# Patient Record
Sex: Male | Born: 1940
Health system: Southern US, Community
[De-identification: ages and names within clinical notes are randomized; demographics above are authoritative.]

## PROBLEM LIST (undated history)

## (undated) DIAGNOSIS — M545 Low back pain, unspecified: Secondary | ICD-10-CM

## (undated) DIAGNOSIS — R809 Proteinuria, unspecified: Secondary | ICD-10-CM

## (undated) DIAGNOSIS — K649 Unspecified hemorrhoids: Secondary | ICD-10-CM

## (undated) DIAGNOSIS — K922 Gastrointestinal hemorrhage, unspecified: Secondary | ICD-10-CM

## (undated) DIAGNOSIS — I10 Essential (primary) hypertension: Secondary | ICD-10-CM

## (undated) DIAGNOSIS — M1990 Unspecified osteoarthritis, unspecified site: Secondary | ICD-10-CM

## (undated) DIAGNOSIS — K219 Gastro-esophageal reflux disease without esophagitis: Secondary | ICD-10-CM

## (undated) DIAGNOSIS — I2510 Atherosclerotic heart disease of native coronary artery without angina pectoris: Secondary | ICD-10-CM

## (undated) DIAGNOSIS — E785 Hyperlipidemia, unspecified: Secondary | ICD-10-CM

## (undated) DIAGNOSIS — Z923 Personal history of irradiation: Secondary | ICD-10-CM

## (undated) DIAGNOSIS — E663 Overweight: Secondary | ICD-10-CM

## (undated) DIAGNOSIS — R3129 Other microscopic hematuria: Secondary | ICD-10-CM

## (undated) DIAGNOSIS — K5730 Diverticulosis of large intestine without perforation or abscess without bleeding: Secondary | ICD-10-CM

## (undated) DIAGNOSIS — D45 Polycythemia vera: Secondary | ICD-10-CM

## (undated) DIAGNOSIS — R739 Hyperglycemia, unspecified: Secondary | ICD-10-CM

## (undated) DIAGNOSIS — D126 Benign neoplasm of colon, unspecified: Secondary | ICD-10-CM

## (undated) HISTORY — DX: Gastro-esophageal reflux disease without esophagitis: K21.9

## (undated) HISTORY — DX: Proteinuria, unspecified: R80.9

## (undated) HISTORY — PX: EXCISIONAL HEMORRHOIDECTOMY: SHX1541

## (undated) HISTORY — PX: TONSILLECTOMY AND ADENOIDECTOMY: SUR1326

## (undated) HISTORY — DX: Hyperlipidemia, unspecified: E78.5

## (undated) HISTORY — PX: CYSTECTOMY: SUR359

## (undated) HISTORY — DX: Other microscopic hematuria: R31.29

## (undated) HISTORY — PX: INGUINAL HERNIA REPAIR: SUR1180

## (undated) HISTORY — DX: Diverticulosis of large intestine without perforation or abscess without bleeding: K57.30

## (undated) HISTORY — DX: Hyperglycemia, unspecified: R73.9

## (undated) HISTORY — PX: VASECTOMY: SHX75

## (undated) HISTORY — DX: Essential (primary) hypertension: I10

## (undated) HISTORY — DX: Benign neoplasm of colon, unspecified: D12.6

## (undated) HISTORY — DX: Low back pain: M54.5

## (undated) HISTORY — DX: Low back pain, unspecified: M54.50

## (undated) HISTORY — DX: Polycythemia vera: D45

## (undated) HISTORY — DX: Unspecified hemorrhoids: K64.9

## (undated) HISTORY — DX: Gastrointestinal hemorrhage, unspecified: K92.2

---

## 2005-03-01 ENCOUNTER — Ambulatory Visit: Payer: Self-pay | Admitting: Internal Medicine

## 2005-03-08 ENCOUNTER — Ambulatory Visit: Payer: Self-pay | Admitting: Internal Medicine

## 2005-03-21 ENCOUNTER — Ambulatory Visit: Payer: Self-pay | Admitting: Gastroenterology

## 2005-03-30 ENCOUNTER — Ambulatory Visit: Payer: Self-pay | Admitting: Internal Medicine

## 2005-04-17 LAB — CBC WITH DIFFERENTIAL/PLATELET
Basophils Absolute: 0.1 10*3/uL (ref 0.0–0.1)
EOS%: 3.3 % (ref 0.0–7.0)
HCT: 47.9 % (ref 38.7–49.9)
HGB: 16.4 g/dL (ref 13.0–17.1)
MCH: 31.5 pg (ref 28.0–33.4)
MCV: 92.3 fL (ref 81.6–98.0)
NEUT%: 57.6 % (ref 40.0–75.0)
Platelets: 232 10*3/uL (ref 145–400)
lymph#: 2.5 10*3/uL (ref 0.9–3.3)

## 2005-04-19 ENCOUNTER — Ambulatory Visit: Payer: Self-pay | Admitting: Gastroenterology

## 2005-04-19 ENCOUNTER — Encounter (INDEPENDENT_AMBULATORY_CARE_PROVIDER_SITE_OTHER): Payer: Self-pay | Admitting: Specialist

## 2005-05-11 ENCOUNTER — Ambulatory Visit: Payer: Self-pay | Admitting: Internal Medicine

## 2005-05-15 LAB — CBC WITH DIFFERENTIAL/PLATELET
Basophils Absolute: 0 10*3/uL (ref 0.0–0.1)
Eosinophils Absolute: 0.2 10*3/uL (ref 0.0–0.5)
HCT: 50.4 % — ABNORMAL HIGH (ref 38.7–49.9)
HGB: 17.1 g/dL (ref 13.0–17.1)
LYMPH%: 21.3 % (ref 14.0–48.0)
MONO#: 1.2 10*3/uL — ABNORMAL HIGH (ref 0.1–0.9)
NEUT#: 6.2 10*3/uL (ref 1.5–6.5)
NEUT%: 64 % (ref 40.0–75.0)
Platelets: 220 10*3/uL (ref 145–400)
WBC: 9.7 10*3/uL (ref 4.0–10.0)

## 2005-05-15 LAB — LACTATE DEHYDROGENASE: LDH: 127 U/L (ref 94–250)

## 2005-06-07 ENCOUNTER — Ambulatory Visit: Payer: Self-pay | Admitting: Internal Medicine

## 2005-06-12 LAB — CBC WITH DIFFERENTIAL/PLATELET
Basophils Absolute: 0.1 10*3/uL (ref 0.0–0.1)
Eosinophils Absolute: 0.3 10*3/uL (ref 0.0–0.5)
HCT: 48.7 % (ref 38.7–49.9)
HGB: 16.5 g/dL (ref 13.0–17.1)
NEUT#: 5.2 10*3/uL (ref 1.5–6.5)
RDW: 14.2 % (ref 11.2–14.6)
lymph#: 2.8 10*3/uL (ref 0.9–3.3)

## 2005-06-30 ENCOUNTER — Ambulatory Visit: Payer: Self-pay | Admitting: Internal Medicine

## 2005-07-10 LAB — CBC WITH DIFFERENTIAL/PLATELET
BASO%: 0.4 % (ref 0.0–2.0)
Eosinophils Absolute: 0.3 10*3/uL (ref 0.0–0.5)
MONO#: 0.9 10*3/uL (ref 0.1–0.9)
NEUT#: 5.4 10*3/uL (ref 1.5–6.5)
RBC: 5.5 10*6/uL (ref 4.20–5.71)
RDW: 14.6 % (ref 11.2–14.6)
WBC: 8.4 10*3/uL (ref 4.0–10.0)
lymph#: 1.8 10*3/uL (ref 0.9–3.3)

## 2005-07-10 LAB — LACTATE DEHYDROGENASE: LDH: 178 U/L (ref 94–250)

## 2005-08-07 ENCOUNTER — Ambulatory Visit: Payer: Self-pay | Admitting: Internal Medicine

## 2005-08-07 LAB — CBC WITH DIFFERENTIAL/PLATELET
Basophils Absolute: 0 10*3/uL (ref 0.0–0.1)
Eosinophils Absolute: 0.6 10*3/uL — ABNORMAL HIGH (ref 0.0–0.5)
HCT: 46.4 % (ref 38.7–49.9)
HGB: 15.6 g/dL (ref 13.0–17.1)
LYMPH%: 25 % (ref 14.0–48.0)
MCV: 93.3 fL (ref 81.6–98.0)
MONO#: 1 10*3/uL — ABNORMAL HIGH (ref 0.1–0.9)
MONO%: 12.3 % (ref 0.0–13.0)
NEUT#: 4.4 10*3/uL (ref 1.5–6.5)
NEUT%: 54.8 % (ref 40.0–75.0)
Platelets: 213 10*3/uL (ref 145–400)
RBC: 4.97 10*6/uL (ref 4.20–5.71)
WBC: 7.9 10*3/uL (ref 4.0–10.0)

## 2005-08-07 LAB — LACTATE DEHYDROGENASE: LDH: 117 U/L (ref 94–250)

## 2005-08-08 ENCOUNTER — Ambulatory Visit: Payer: Self-pay | Admitting: Internal Medicine

## 2005-08-21 ENCOUNTER — Ambulatory Visit: Payer: Self-pay | Admitting: Internal Medicine

## 2005-09-05 LAB — LACTATE DEHYDROGENASE: LDH: 117 U/L (ref 94–250)

## 2005-09-05 LAB — CBC WITH DIFFERENTIAL/PLATELET
BASO%: 0.7 % (ref 0.0–2.0)
Basophils Absolute: 0.1 10*3/uL (ref 0.0–0.1)
EOS%: 8.4 % — ABNORMAL HIGH (ref 0.0–7.0)
HGB: 16.2 g/dL (ref 13.0–17.1)
MCH: 31.6 pg (ref 28.0–33.4)
MCHC: 33.6 g/dL (ref 32.0–35.9)
RBC: 5.12 10*6/uL (ref 4.20–5.71)
RDW: 14.6 % (ref 11.2–14.6)
lymph#: 2.2 10*3/uL (ref 0.9–3.3)

## 2005-09-29 ENCOUNTER — Ambulatory Visit: Payer: Self-pay | Admitting: Internal Medicine

## 2005-10-06 ENCOUNTER — Ambulatory Visit: Payer: Self-pay | Admitting: Internal Medicine

## 2005-10-10 ENCOUNTER — Ambulatory Visit: Payer: Self-pay | Admitting: Internal Medicine

## 2005-11-24 ENCOUNTER — Ambulatory Visit: Payer: Self-pay | Admitting: Internal Medicine

## 2005-11-28 LAB — CBC WITH DIFFERENTIAL/PLATELET
Basophils Absolute: 0 10*3/uL (ref 0.0–0.1)
EOS%: 8.3 % — ABNORMAL HIGH (ref 0.0–7.0)
Eosinophils Absolute: 0.7 10*3/uL — ABNORMAL HIGH (ref 0.0–0.5)
HCT: 47.2 % (ref 38.7–49.9)
HGB: 16 g/dL (ref 13.0–17.1)
MCH: 31.6 pg (ref 28.0–33.4)
NEUT#: 4.5 10*3/uL (ref 1.5–6.5)
NEUT%: 52.2 % (ref 40.0–75.0)
RDW: 14.3 % (ref 11.2–14.6)
lymph#: 2.3 10*3/uL (ref 0.9–3.3)

## 2005-12-25 LAB — LACTATE DEHYDROGENASE: LDH: 127 U/L (ref 94–250)

## 2005-12-25 LAB — CBC WITH DIFFERENTIAL/PLATELET
Eosinophils Absolute: 0.5 10*3/uL (ref 0.0–0.5)
HCT: 48.9 % (ref 38.7–49.9)
LYMPH%: 24.2 % (ref 14.0–48.0)
MONO#: 1.1 10*3/uL — ABNORMAL HIGH (ref 0.1–0.9)
NEUT#: 4.9 10*3/uL (ref 1.5–6.5)
NEUT%: 56.7 % (ref 40.0–75.0)
Platelets: 239 10*3/uL (ref 145–400)
RBC: 5.31 10*6/uL (ref 4.20–5.71)
WBC: 8.7 10*3/uL (ref 4.0–10.0)
lymph#: 2.1 10*3/uL (ref 0.9–3.3)

## 2006-01-02 HISTORY — PX: CATARACT EXTRACTION W/ INTRAOCULAR LENS  IMPLANT, BILATERAL: SHX1307

## 2006-01-03 ENCOUNTER — Ambulatory Visit: Payer: Self-pay | Admitting: Internal Medicine

## 2006-01-03 ENCOUNTER — Ambulatory Visit (HOSPITAL_COMMUNITY): Admission: RE | Admit: 2006-01-03 | Discharge: 2006-01-03 | Payer: Self-pay | Admitting: Internal Medicine

## 2006-01-19 ENCOUNTER — Ambulatory Visit: Payer: Self-pay | Admitting: Internal Medicine

## 2006-01-24 LAB — CBC WITH DIFFERENTIAL/PLATELET
Basophils Absolute: 0.1 10*3/uL (ref 0.0–0.1)
EOS%: 6.9 % (ref 0.0–7.0)
HCT: 50.6 % — ABNORMAL HIGH (ref 38.7–49.9)
HGB: 17.1 g/dL (ref 13.0–17.1)
MCH: 31.4 pg (ref 28.0–33.4)
MCV: 92.8 fL (ref 81.6–98.0)
MONO%: 12.3 % (ref 0.0–13.0)
NEUT%: 54.4 % (ref 40.0–75.0)

## 2006-02-21 LAB — CBC WITH DIFFERENTIAL/PLATELET
Basophils Absolute: 0.1 10*3/uL (ref 0.0–0.1)
EOS%: 5.3 % (ref 0.0–7.0)
HGB: 15.7 g/dL (ref 13.0–17.1)
LYMPH%: 23.8 % (ref 14.0–48.0)
MCH: 31.1 pg (ref 28.0–33.4)
MCV: 91.8 fL (ref 81.6–98.0)
MONO%: 13.4 % — ABNORMAL HIGH (ref 0.0–13.0)
RBC: 5.03 10*6/uL (ref 4.20–5.71)
RDW: 13.9 % (ref 11.2–14.6)

## 2006-03-12 ENCOUNTER — Ambulatory Visit: Payer: Self-pay | Admitting: Internal Medicine

## 2006-03-19 LAB — CBC WITH DIFFERENTIAL/PLATELET
BASO%: 1.1 % (ref 0.0–2.0)
EOS%: 6.3 % (ref 0.0–7.0)
Eosinophils Absolute: 0.5 10*3/uL (ref 0.0–0.5)
MCHC: 34.6 g/dL (ref 32.0–35.9)
MCV: 90.9 fL (ref 81.6–98.0)
MONO%: 11.8 % (ref 0.0–13.0)
NEUT#: 4.7 10*3/uL (ref 1.5–6.5)
RBC: 5.32 10*6/uL (ref 4.20–5.71)
RDW: 14.2 % (ref 11.2–14.6)

## 2006-03-22 ENCOUNTER — Ambulatory Visit: Payer: Self-pay | Admitting: Internal Medicine

## 2006-04-23 LAB — LACTATE DEHYDROGENASE: LDH: 122 U/L (ref 94–250)

## 2006-04-23 LAB — CBC WITH DIFFERENTIAL/PLATELET
BASO%: 0.5 % (ref 0.0–2.0)
Eosinophils Absolute: 0.4 10*3/uL (ref 0.0–0.5)
MCHC: 34.5 g/dL (ref 32.0–35.9)
MONO#: 0.8 10*3/uL (ref 0.1–0.9)
NEUT#: 5.2 10*3/uL (ref 1.5–6.5)
Platelets: 201 10*3/uL (ref 145–400)
RBC: 5.15 10*6/uL (ref 4.20–5.71)
RDW: 13.7 % (ref 11.2–14.6)
WBC: 8.4 10*3/uL (ref 4.0–10.0)
lymph#: 2 10*3/uL (ref 0.9–3.3)

## 2006-05-22 ENCOUNTER — Ambulatory Visit: Payer: Self-pay | Admitting: Internal Medicine

## 2006-05-24 LAB — CBC WITH DIFFERENTIAL/PLATELET
BASO%: 0.4 % (ref 0.0–2.0)
EOS%: 4.9 % (ref 0.0–7.0)
HGB: 16.8 g/dL (ref 13.0–17.1)
LYMPH%: 25.3 % (ref 14.0–48.0)
MCH: 31.4 pg (ref 28.0–33.4)
MCHC: 34.7 g/dL (ref 32.0–35.9)
MCV: 90.6 fL (ref 81.6–98.0)
MONO%: 12.4 % (ref 0.0–13.0)
NEUT#: 5.1 10*3/uL (ref 1.5–6.5)
NEUT%: 57 % (ref 40.0–75.0)
WBC: 8.9 10*3/uL (ref 4.0–10.0)
lymph#: 2.2 10*3/uL (ref 0.9–3.3)

## 2006-06-20 ENCOUNTER — Ambulatory Visit: Payer: Self-pay | Admitting: Internal Medicine

## 2006-06-20 LAB — CONVERTED CEMR LAB
ALT: 25 units/L (ref 0–40)
Albumin: 3.8 g/dL (ref 3.5–5.2)
Alkaline Phosphatase: 67 units/L (ref 39–117)
Cholesterol: 150 mg/dL (ref 0–200)
LDL Cholesterol: 96 mg/dL (ref 0–99)
PSA: 0.28 ng/mL (ref 0.10–4.00)
Total Bilirubin: 0.8 mg/dL (ref 0.3–1.2)

## 2006-06-25 LAB — CBC WITH DIFFERENTIAL/PLATELET
EOS%: 4.7 % (ref 0.0–7.0)
LYMPH%: 20.4 % (ref 14.0–48.0)
MCH: 31.6 pg (ref 28.0–33.4)
MCHC: 34.4 g/dL (ref 32.0–35.9)
MCV: 91.8 fL (ref 81.6–98.0)
MONO%: 10.1 % (ref 0.0–13.0)
RBC: 5.39 10*6/uL (ref 4.20–5.71)
RDW: 14.1 % (ref 11.2–14.6)

## 2006-06-25 LAB — LACTATE DEHYDROGENASE: LDH: 123 U/L (ref 94–250)

## 2006-06-29 ENCOUNTER — Ambulatory Visit: Payer: Self-pay | Admitting: Internal Medicine

## 2006-07-19 ENCOUNTER — Ambulatory Visit: Payer: Self-pay | Admitting: Internal Medicine

## 2006-07-23 LAB — CBC WITH DIFFERENTIAL/PLATELET
BASO%: 0.6 % (ref 0.0–2.0)
EOS%: 5.9 % (ref 0.0–7.0)
LYMPH%: 25.2 % (ref 14.0–48.0)
MCH: 32 pg (ref 28.0–33.4)
MCHC: 35.2 g/dL (ref 32.0–35.9)
MONO#: 0.9 10*3/uL (ref 0.1–0.9)
Platelets: 227 10*3/uL (ref 145–400)
RBC: 5.09 10*6/uL (ref 4.20–5.71)
WBC: 7.7 10*3/uL (ref 4.0–10.0)
lymph#: 2 10*3/uL (ref 0.9–3.3)

## 2006-08-07 ENCOUNTER — Ambulatory Visit: Payer: Self-pay | Admitting: Internal Medicine

## 2006-08-07 LAB — CONVERTED CEMR LAB
BUN: 18 mg/dL (ref 6–23)
Calcium: 9.7 mg/dL (ref 8.4–10.5)
GFR calc non Af Amer: 71 mL/min
Glucose, Bld: 97 mg/dL (ref 70–99)
Hgb A1c MFr Bld: 5.6 % (ref 4.6–6.0)
Microalb Creat Ratio: 23.1 mg/g (ref 0.0–30.0)
Microalb, Ur: 2.4 mg/dL — ABNORMAL HIGH (ref 0.0–1.9)
Sodium: 141 meq/L (ref 135–145)

## 2006-08-13 ENCOUNTER — Ambulatory Visit: Payer: Self-pay | Admitting: Internal Medicine

## 2006-08-20 LAB — CBC WITH DIFFERENTIAL/PLATELET
BASO%: 0.8 % (ref 0.0–2.0)
EOS%: 7.3 % — ABNORMAL HIGH (ref 0.0–7.0)
HCT: 47.5 % (ref 38.7–49.9)
MCH: 31.7 pg (ref 28.0–33.4)
MCHC: 34.3 g/dL (ref 32.0–35.9)
MONO#: 1.1 10*3/uL — ABNORMAL HIGH (ref 0.1–0.9)
NEUT%: 47 % (ref 40.0–75.0)
RDW: 14.5 % (ref 11.2–14.6)
WBC: 7.4 10*3/uL (ref 4.0–10.0)
lymph#: 2.2 10*3/uL (ref 0.9–3.3)

## 2006-09-13 ENCOUNTER — Ambulatory Visit: Payer: Self-pay | Admitting: Internal Medicine

## 2006-09-17 LAB — CBC WITH DIFFERENTIAL/PLATELET
Basophils Absolute: 0.2 10*3/uL — ABNORMAL HIGH (ref 0.0–0.1)
EOS%: 5.5 % (ref 0.0–7.0)
HCT: 49.4 % (ref 38.7–49.9)
HGB: 17.2 g/dL — ABNORMAL HIGH (ref 13.0–17.1)
MCH: 32.1 pg (ref 28.0–33.4)
MCV: 92.2 fL (ref 81.6–98.0)
MONO%: 12.8 % (ref 0.0–13.0)
NEUT%: 58.1 % (ref 40.0–75.0)
RDW: 11.9 % (ref 11.2–14.6)

## 2006-10-01 ENCOUNTER — Emergency Department (HOSPITAL_COMMUNITY): Admission: EM | Admit: 2006-10-01 | Discharge: 2006-10-01 | Payer: Self-pay | Admitting: Emergency Medicine

## 2006-10-04 ENCOUNTER — Ambulatory Visit: Payer: Self-pay | Admitting: Internal Medicine

## 2006-11-07 ENCOUNTER — Ambulatory Visit: Payer: Self-pay | Admitting: Internal Medicine

## 2006-11-12 LAB — CBC WITH DIFFERENTIAL/PLATELET
BASO%: 0.4 % (ref 0.0–2.0)
EOS%: 3.9 % (ref 0.0–7.0)
MCH: 31.5 pg (ref 28.0–33.4)
MCV: 91.5 fL (ref 81.6–98.0)
MONO%: 12.8 % (ref 0.0–13.0)
RBC: 5.14 10*6/uL (ref 4.20–5.71)
RDW: 13.8 % (ref 11.2–14.6)

## 2006-11-12 LAB — LACTATE DEHYDROGENASE: LDH: 105 U/L (ref 94–250)

## 2006-11-26 ENCOUNTER — Telehealth: Payer: Self-pay | Admitting: Internal Medicine

## 2006-11-26 ENCOUNTER — Ambulatory Visit: Payer: Self-pay | Admitting: Internal Medicine

## 2006-11-26 DIAGNOSIS — K5730 Diverticulosis of large intestine without perforation or abscess without bleeding: Secondary | ICD-10-CM | POA: Insufficient documentation

## 2006-11-26 DIAGNOSIS — I10 Essential (primary) hypertension: Secondary | ICD-10-CM | POA: Insufficient documentation

## 2006-11-26 DIAGNOSIS — D45 Polycythemia vera: Secondary | ICD-10-CM | POA: Insufficient documentation

## 2006-11-26 DIAGNOSIS — E785 Hyperlipidemia, unspecified: Secondary | ICD-10-CM | POA: Insufficient documentation

## 2006-11-26 DIAGNOSIS — Z860100 Personal history of colon polyps, unspecified: Secondary | ICD-10-CM | POA: Insufficient documentation

## 2006-11-26 DIAGNOSIS — Z87442 Personal history of urinary calculi: Secondary | ICD-10-CM | POA: Insufficient documentation

## 2006-11-26 DIAGNOSIS — R109 Unspecified abdominal pain: Secondary | ICD-10-CM | POA: Insufficient documentation

## 2006-11-26 DIAGNOSIS — M109 Gout, unspecified: Secondary | ICD-10-CM | POA: Insufficient documentation

## 2006-11-27 ENCOUNTER — Encounter (INDEPENDENT_AMBULATORY_CARE_PROVIDER_SITE_OTHER): Payer: Self-pay | Admitting: *Deleted

## 2006-12-13 ENCOUNTER — Ambulatory Visit: Payer: Self-pay | Admitting: Internal Medicine

## 2006-12-13 DIAGNOSIS — R7303 Prediabetes: Secondary | ICD-10-CM | POA: Insufficient documentation

## 2006-12-13 LAB — CONVERTED CEMR LAB
ALT: 20 units/L (ref 0–53)
AST: 19 units/L (ref 0–37)
Cholesterol: 139 mg/dL (ref 0–200)
Total CHOL/HDL Ratio: 3.9

## 2006-12-14 ENCOUNTER — Encounter: Payer: Self-pay | Admitting: Internal Medicine

## 2006-12-17 ENCOUNTER — Encounter: Payer: Self-pay | Admitting: Internal Medicine

## 2006-12-17 LAB — CBC WITH DIFFERENTIAL/PLATELET
BASO%: 0.5 % (ref 0.0–2.0)
LYMPH%: 23.7 % (ref 14.0–48.0)
MCHC: 34.7 g/dL (ref 32.0–35.9)
MONO#: 0.6 10*3/uL (ref 0.1–0.9)
Platelets: 209 10*3/uL (ref 145–400)
RBC: 4.98 10*6/uL (ref 4.20–5.71)
WBC: 7.4 10*3/uL (ref 4.0–10.0)
lymph#: 1.8 10*3/uL (ref 0.9–3.3)

## 2006-12-17 LAB — LACTATE DEHYDROGENASE: LDH: 109 U/L (ref 94–250)

## 2006-12-18 ENCOUNTER — Ambulatory Visit: Payer: Self-pay | Admitting: Internal Medicine

## 2007-02-06 ENCOUNTER — Ambulatory Visit: Payer: Self-pay | Admitting: Internal Medicine

## 2007-02-06 DIAGNOSIS — R197 Diarrhea, unspecified: Secondary | ICD-10-CM | POA: Insufficient documentation

## 2007-02-07 ENCOUNTER — Ambulatory Visit: Payer: Self-pay | Admitting: Internal Medicine

## 2007-02-11 ENCOUNTER — Encounter: Payer: Self-pay | Admitting: Internal Medicine

## 2007-02-11 LAB — CBC WITH DIFFERENTIAL/PLATELET
BASO%: 0.7 % (ref 0.0–2.0)
Eosinophils Absolute: 0.5 10*3/uL (ref 0.0–0.5)
MCHC: 34.5 g/dL (ref 32.0–35.9)
MONO#: 0.9 10*3/uL (ref 0.1–0.9)
NEUT#: 5.7 10*3/uL (ref 1.5–6.5)
RBC: 5.01 10*6/uL (ref 4.20–5.71)
RDW: 13.9 % (ref 11.2–14.6)
WBC: 9.3 10*3/uL (ref 4.0–10.0)

## 2007-03-06 ENCOUNTER — Telehealth: Payer: Self-pay | Admitting: Internal Medicine

## 2007-03-12 LAB — CBC WITH DIFFERENTIAL/PLATELET
BASO%: 1.1 % (ref 0.0–2.0)
Basophils Absolute: 0.1 10*3/uL (ref 0.0–0.1)
EOS%: 4.9 % (ref 0.0–7.0)
HGB: 15.2 g/dL (ref 13.0–17.1)
MCH: 31.8 pg (ref 28.0–33.4)
RDW: 14.6 % (ref 11.2–14.6)
lymph#: 2.2 10*3/uL (ref 0.9–3.3)

## 2007-03-13 ENCOUNTER — Ambulatory Visit: Payer: Self-pay | Admitting: Internal Medicine

## 2007-03-17 LAB — CONVERTED CEMR LAB
Bilirubin Urine: NEGATIVE
Creatinine, Ser: 1.1 mg/dL (ref 0.4–1.5)
Crystals: NEGATIVE
GFR calc Af Amer: 86 mL/min
Glucose, Bld: 95 mg/dL (ref 70–99)
HDL: 33.4 mg/dL — ABNORMAL LOW (ref >39.0)
LDL Cholesterol: 103 mg/dL — ABNORMAL HIGH (ref 0–99)
Leukocytes, UA: NEGATIVE
Total CHOL/HDL Ratio: 4.7
Triglycerides: 110 mg/dL (ref 0–149)
Urobilinogen, UA: 0.2 (ref 0.0–1.0)
VLDL: 22 mg/dL (ref 0–40)

## 2007-03-18 ENCOUNTER — Ambulatory Visit: Payer: Self-pay | Admitting: Internal Medicine

## 2007-03-20 ENCOUNTER — Ambulatory Visit: Payer: Self-pay | Admitting: Internal Medicine

## 2007-03-20 DIAGNOSIS — R3129 Other microscopic hematuria: Secondary | ICD-10-CM | POA: Insufficient documentation

## 2007-04-09 ENCOUNTER — Encounter: Payer: Self-pay | Admitting: Internal Medicine

## 2007-04-16 LAB — CBC WITH DIFFERENTIAL/PLATELET
BASO%: 0.5 % (ref 0.0–2.0)
EOS%: 5.6 % (ref 0.0–7.0)
HCT: 45.8 % (ref 38.7–49.9)
MCH: 31.8 pg (ref 28.0–33.4)
MCHC: 34.5 g/dL (ref 32.0–35.9)
NEUT%: 56.6 % (ref 40.0–75.0)
RDW: 14 % (ref 11.2–14.6)
lymph#: 2.4 10*3/uL (ref 0.9–3.3)

## 2007-06-11 ENCOUNTER — Ambulatory Visit: Payer: Self-pay | Admitting: Internal Medicine

## 2007-06-11 LAB — CONVERTED CEMR LAB
ALT: 19 units/L (ref 0–53)
Creatinine,U: 187.1 mg/dL
Hgb A1c MFr Bld: 6 % (ref 4.6–6.0)
Microalb, Ur: 5 mg/dL — ABNORMAL HIGH (ref 0.0–1.9)

## 2007-06-13 ENCOUNTER — Ambulatory Visit: Payer: Self-pay | Admitting: Internal Medicine

## 2007-06-18 ENCOUNTER — Encounter: Payer: Self-pay | Admitting: Internal Medicine

## 2007-06-18 LAB — LACTATE DEHYDROGENASE: LDH: 96 U/L (ref 94–250)

## 2007-06-18 LAB — CBC WITH DIFFERENTIAL/PLATELET
Basophils Absolute: 0 10*3/uL (ref 0.0–0.1)
EOS%: 4.8 % (ref 0.0–7.0)
Eosinophils Absolute: 0.4 10*3/uL (ref 0.0–0.5)
HCT: 48.3 % (ref 38.7–49.9)
HGB: 16.5 g/dL (ref 13.0–17.1)
MONO#: 0.9 10*3/uL (ref 0.1–0.9)
NEUT#: 5.6 10*3/uL (ref 1.5–6.5)
NEUT%: 62.9 % (ref 40.0–75.0)
RDW: 13.9 % (ref 11.2–14.6)
WBC: 8.9 10*3/uL (ref 4.0–10.0)
lymph#: 1.9 10*3/uL (ref 0.9–3.3)

## 2007-06-19 ENCOUNTER — Ambulatory Visit: Payer: Self-pay | Admitting: Internal Medicine

## 2007-07-04 ENCOUNTER — Telehealth (INDEPENDENT_AMBULATORY_CARE_PROVIDER_SITE_OTHER): Payer: Self-pay | Admitting: *Deleted

## 2007-07-15 LAB — CBC WITH DIFFERENTIAL/PLATELET
Basophils Absolute: 0.1 10*3/uL (ref 0.0–0.1)
EOS%: 4.4 % (ref 0.0–7.0)
HCT: 45.8 % (ref 38.7–49.9)
HGB: 15.7 g/dL (ref 13.0–17.1)
MCH: 31.4 pg (ref 28.0–33.4)
NEUT%: 65.1 % (ref 40.0–75.0)
lymph#: 1.9 10*3/uL (ref 0.9–3.3)

## 2007-07-15 LAB — LACTATE DEHYDROGENASE: LDH: 123 U/L (ref 94–250)

## 2007-07-23 ENCOUNTER — Encounter: Payer: Self-pay | Admitting: Internal Medicine

## 2007-08-09 ENCOUNTER — Ambulatory Visit: Payer: Self-pay | Admitting: Internal Medicine

## 2007-08-13 LAB — CBC WITH DIFFERENTIAL/PLATELET
Basophils Absolute: 0 10*3/uL (ref 0.0–0.1)
Eosinophils Absolute: 0.3 10*3/uL (ref 0.0–0.5)
HCT: 46.5 % (ref 38.7–49.9)
HGB: 15.7 g/dL (ref 13.0–17.1)
LYMPH%: 18.8 % (ref 14.0–48.0)
MONO#: 0.8 10*3/uL (ref 0.1–0.9)
NEUT#: 5.8 10*3/uL (ref 1.5–6.5)
NEUT%: 67.7 % (ref 40.0–75.0)
Platelets: 205 10*3/uL (ref 145–400)
RBC: 4.92 10*6/uL (ref 4.20–5.71)
WBC: 8.6 10*3/uL (ref 4.0–10.0)

## 2007-08-13 LAB — LACTATE DEHYDROGENASE: LDH: 98 U/L (ref 94–250)

## 2007-08-19 ENCOUNTER — Telehealth: Payer: Self-pay | Admitting: Internal Medicine

## 2007-09-18 ENCOUNTER — Encounter: Payer: Self-pay | Admitting: Internal Medicine

## 2007-09-18 LAB — CBC WITH DIFFERENTIAL/PLATELET
Basophils Absolute: 0 10*3/uL (ref 0.0–0.1)
Eosinophils Absolute: 0.7 10*3/uL — ABNORMAL HIGH (ref 0.0–0.5)
HCT: 47.3 % (ref 38.7–49.9)
HGB: 16.1 g/dL (ref 13.0–17.1)
MONO#: 1 10*3/uL — ABNORMAL HIGH (ref 0.1–0.9)
NEUT#: 6 10*3/uL (ref 1.5–6.5)
RDW: 13.6 % (ref 11.2–14.6)
lymph#: 1.8 10*3/uL (ref 0.9–3.3)

## 2007-09-26 ENCOUNTER — Ambulatory Visit: Payer: Self-pay | Admitting: Internal Medicine

## 2007-10-04 ENCOUNTER — Ambulatory Visit: Payer: Self-pay | Admitting: Internal Medicine

## 2007-11-04 ENCOUNTER — Ambulatory Visit: Payer: Self-pay | Admitting: Internal Medicine

## 2007-11-06 ENCOUNTER — Ambulatory Visit: Payer: Self-pay | Admitting: Internal Medicine

## 2007-11-11 ENCOUNTER — Ambulatory Visit: Payer: Self-pay | Admitting: Internal Medicine

## 2007-12-04 ENCOUNTER — Ambulatory Visit: Payer: Self-pay | Admitting: Hematology & Oncology

## 2007-12-18 ENCOUNTER — Encounter: Payer: Self-pay | Admitting: Internal Medicine

## 2007-12-18 LAB — CBC WITH DIFFERENTIAL (CANCER CENTER ONLY)
BASO#: 0.2 10*3/uL (ref 0.0–0.2)
EOS%: 3.1 % (ref 0.0–7.0)
Eosinophils Absolute: 0.3 10*3/uL (ref 0.0–0.5)
HCT: 47.1 % (ref 38.7–49.9)
HGB: 15.7 g/dL (ref 13.0–17.1)
LYMPH#: 2.2 10*3/uL (ref 0.9–3.3)
MCHC: 33.4 g/dL (ref 32.0–35.9)
MONO#: 0.7 10*3/uL (ref 0.1–0.9)
NEUT#: 6.3 10*3/uL (ref 1.5–6.5)
NEUT%: 65.4 % (ref 40.0–80.0)
RBC: 5.05 10*6/uL (ref 4.20–5.70)

## 2007-12-18 LAB — CHCC SATELLITE - SMEAR

## 2007-12-24 LAB — JAK2 EXONS 12 & 13 MUTATION, REFLEX: JAK2 Exons 12 & 13: 13

## 2007-12-24 LAB — FERRITIN: Ferritin: 53 ng/mL (ref 22–322)

## 2008-01-13 ENCOUNTER — Telehealth: Payer: Self-pay | Admitting: Internal Medicine

## 2008-01-15 LAB — CBC WITH DIFFERENTIAL (CANCER CENTER ONLY)
BASO%: 2.4 % — ABNORMAL HIGH (ref 0.0–2.0)
HCT: 50.2 % — ABNORMAL HIGH (ref 38.7–49.9)
HGB: 17.1 g/dL (ref 13.0–17.1)
LYMPH#: 2 10*3/uL (ref 0.9–3.3)
MONO#: 1 10*3/uL — ABNORMAL HIGH (ref 0.1–0.9)
NEUT#: 5.6 10*3/uL (ref 1.5–6.5)
NEUT%: 60.9 % (ref 40.0–80.0)
RDW: 12.4 % (ref 10.5–14.6)
WBC: 9.2 10*3/uL (ref 4.0–10.0)

## 2008-02-18 ENCOUNTER — Ambulatory Visit: Payer: Self-pay | Admitting: Hematology & Oncology

## 2008-02-19 LAB — CBC WITH DIFFERENTIAL (CANCER CENTER ONLY)
BASO%: 1 % (ref 0.0–2.0)
EOS%: 4.5 % (ref 0.0–7.0)
LYMPH#: 2.2 10*3/uL (ref 0.9–3.3)
MONO#: 0.7 10*3/uL (ref 0.1–0.9)
Platelets: 223 10*3/uL (ref 145–400)
RDW: 11.2 % (ref 10.5–14.6)
WBC: 8.3 10*3/uL (ref 4.0–10.0)

## 2008-02-24 ENCOUNTER — Encounter: Payer: Self-pay | Admitting: Internal Medicine

## 2008-03-18 LAB — CBC WITH DIFFERENTIAL (CANCER CENTER ONLY)
BASO%: 1.5 % (ref 0.0–2.0)
EOS%: 4.2 % (ref 0.0–7.0)
LYMPH%: 24.8 % (ref 14.0–48.0)
MCH: 31.4 pg (ref 28.0–33.4)
MCHC: 32.7 g/dL (ref 32.0–35.9)
MCV: 96 fL (ref 82–98)
MONO%: 9.1 % (ref 0.0–13.0)
Platelets: 233 10*3/uL (ref 145–400)
RDW: 11 % (ref 10.5–14.6)
WBC: 8.8 10*3/uL (ref 4.0–10.0)

## 2008-03-18 LAB — FERRITIN: Ferritin: 24 ng/mL (ref 22–322)

## 2008-03-18 LAB — CHCC SATELLITE - SMEAR

## 2008-03-27 ENCOUNTER — Ambulatory Visit: Payer: Self-pay | Admitting: Internal Medicine

## 2008-03-27 LAB — CONVERTED CEMR LAB
Chloride: 102 meq/L (ref 96–112)
Cholesterol: 110 mg/dL (ref 0–200)
Sodium: 138 meq/L (ref 135–145)
TSH: 1.11 microintl units/mL (ref 0.35–5.50)
Triglycerides: 91 mg/dL (ref 0.0–149.0)
VLDL: 18.2 mg/dL (ref 0.0–40.0)

## 2008-04-02 ENCOUNTER — Ambulatory Visit: Payer: Self-pay | Admitting: Internal Medicine

## 2008-04-03 ENCOUNTER — Encounter: Payer: Self-pay | Admitting: Internal Medicine

## 2008-04-03 LAB — CBC WITH DIFFERENTIAL (CANCER CENTER ONLY)
BASO#: 0.1 10*3/uL (ref 0.0–0.2)
EOS%: 4.3 % (ref 0.0–7.0)
Eosinophils Absolute: 0.3 10*3/uL (ref 0.0–0.5)
HCT: 46.7 % (ref 38.7–49.9)
HGB: 15.4 g/dL (ref 13.0–17.1)
LYMPH#: 2 10*3/uL (ref 0.9–3.3)
MCHC: 32.9 g/dL (ref 32.0–35.9)
NEUT#: 4.4 10*3/uL (ref 1.5–6.5)
NEUT%: 58.9 % (ref 40.0–80.0)
RBC: 4.91 10*6/uL (ref 4.20–5.70)

## 2008-04-17 ENCOUNTER — Ambulatory Visit: Payer: Self-pay | Admitting: Hematology & Oncology

## 2008-04-20 LAB — CBC WITH DIFFERENTIAL (CANCER CENTER ONLY)
BASO#: 0 10*3/uL (ref 0.0–0.2)
Eosinophils Absolute: 0.3 10*3/uL (ref 0.0–0.5)
LYMPH%: 23.3 % (ref 14.0–48.0)
MCH: 31.2 pg (ref 28.0–33.4)
MCV: 93 fL (ref 82–98)
MONO%: 10 % (ref 0.0–13.0)
NEUT%: 61.3 % (ref 40.0–80.0)
Platelets: 217 10*3/uL (ref 145–400)
RBC: 4.76 10*6/uL (ref 4.20–5.70)

## 2008-06-18 ENCOUNTER — Ambulatory Visit: Payer: Self-pay | Admitting: Hematology & Oncology

## 2008-06-18 ENCOUNTER — Encounter: Payer: Self-pay | Admitting: Internal Medicine

## 2008-06-18 LAB — CBC WITH DIFFERENTIAL (CANCER CENTER ONLY)
BASO#: 0 10*3/uL (ref 0.0–0.2)
BASO%: 0.6 % (ref 0.0–2.0)
HCT: 46.9 % (ref 38.7–49.9)
HGB: 15.5 g/dL (ref 13.0–17.1)
LYMPH#: 1.6 10*3/uL (ref 0.9–3.3)
MONO#: 0.6 10*3/uL (ref 0.1–0.9)
NEUT#: 3.7 10*3/uL (ref 1.5–6.5)
NEUT%: 58.9 % (ref 40.0–80.0)
RDW: 11.5 % (ref 10.5–14.6)
WBC: 6.2 10*3/uL (ref 4.0–10.0)

## 2008-06-26 ENCOUNTER — Ambulatory Visit: Payer: Self-pay | Admitting: Internal Medicine

## 2008-06-26 DIAGNOSIS — M545 Low back pain, unspecified: Secondary | ICD-10-CM | POA: Insufficient documentation

## 2008-07-15 ENCOUNTER — Ambulatory Visit (HOSPITAL_BASED_OUTPATIENT_CLINIC_OR_DEPARTMENT_OTHER): Admission: RE | Admit: 2008-07-15 | Discharge: 2008-07-15 | Payer: Self-pay | Admitting: Family Medicine

## 2008-07-15 ENCOUNTER — Ambulatory Visit: Payer: Self-pay | Admitting: Family Medicine

## 2008-07-15 ENCOUNTER — Ambulatory Visit: Payer: Self-pay | Admitting: Diagnostic Radiology

## 2008-07-21 ENCOUNTER — Encounter: Payer: Self-pay | Admitting: Family Medicine

## 2008-07-21 ENCOUNTER — Ambulatory Visit: Payer: Self-pay | Admitting: Internal Medicine

## 2008-07-21 LAB — CONVERTED CEMR LAB
Hgb A1c MFr Bld: 5.5 % (ref 4.6–6.1)
Microalb, Ur: 5.14 mg/dL — ABNORMAL HIGH (ref 0.00–1.89)

## 2008-07-22 DIAGNOSIS — R809 Proteinuria, unspecified: Secondary | ICD-10-CM | POA: Insufficient documentation

## 2008-07-24 ENCOUNTER — Encounter: Payer: Self-pay | Admitting: Internal Medicine

## 2008-07-24 LAB — CONVERTED CEMR LAB
Collection Interval-CRCL: 24 hr
Creatinine 24 HR UR: 1750 mg/24hr (ref 800–2000)
Creatinine Clearance: 100 mL/min (ref 75–125)
Creatinine, Urine: 116.7 mg/dL
Protein, Ur: 105 mg/24hr — ABNORMAL HIGH (ref 50–100)

## 2008-07-27 ENCOUNTER — Ambulatory Visit: Payer: Self-pay | Admitting: Internal Medicine

## 2008-07-28 ENCOUNTER — Encounter: Payer: Self-pay | Admitting: Internal Medicine

## 2008-07-28 ENCOUNTER — Telehealth: Payer: Self-pay | Admitting: Internal Medicine

## 2008-07-31 ENCOUNTER — Ambulatory Visit: Payer: Self-pay | Admitting: Hematology & Oncology

## 2008-08-03 ENCOUNTER — Ambulatory Visit: Payer: Self-pay | Admitting: Internal Medicine

## 2008-08-03 LAB — CBC WITH DIFFERENTIAL (CANCER CENTER ONLY)
BASO%: 1.6 % (ref 0.0–2.0)
EOS%: 5.5 % (ref 0.0–7.0)
LYMPH%: 21.9 % (ref 14.0–48.0)
MCH: 30.2 pg (ref 28.0–33.4)
MCHC: 32.9 g/dL (ref 32.0–35.9)
MCV: 92 fL (ref 82–98)
MONO%: 8.4 % (ref 0.0–13.0)
Platelets: 223 10*3/uL (ref 145–400)
RDW: 12.8 % (ref 10.5–14.6)
WBC: 10.7 10*3/uL — ABNORMAL HIGH (ref 4.0–10.0)

## 2008-08-03 LAB — HM DIABETES FOOT EXAM

## 2008-08-05 ENCOUNTER — Telehealth: Payer: Self-pay | Admitting: Internal Medicine

## 2008-08-08 ENCOUNTER — Emergency Department (HOSPITAL_BASED_OUTPATIENT_CLINIC_OR_DEPARTMENT_OTHER): Admission: EM | Admit: 2008-08-08 | Discharge: 2008-08-08 | Payer: Self-pay | Admitting: Emergency Medicine

## 2008-08-10 ENCOUNTER — Ambulatory Visit: Payer: Self-pay | Admitting: Internal Medicine

## 2008-08-10 DIAGNOSIS — R42 Dizziness and giddiness: Secondary | ICD-10-CM | POA: Insufficient documentation

## 2008-08-10 LAB — CBC WITH DIFFERENTIAL (CANCER CENTER ONLY)
BASO%: 1.4 % (ref 0.0–2.0)
EOS%: 3.4 % (ref 0.0–7.0)
LYMPH#: 2.2 10*3/uL (ref 0.9–3.3)
MONO#: 1 10*3/uL — ABNORMAL HIGH (ref 0.1–0.9)
Platelets: 233 10*3/uL (ref 145–400)
RDW: 13.1 % (ref 10.5–14.6)
WBC: 9.6 10*3/uL (ref 4.0–10.0)

## 2008-08-20 ENCOUNTER — Ambulatory Visit: Payer: Self-pay | Admitting: Internal Medicine

## 2008-08-28 ENCOUNTER — Telehealth: Payer: Self-pay | Admitting: Internal Medicine

## 2008-09-08 ENCOUNTER — Telehealth: Payer: Self-pay | Admitting: Internal Medicine

## 2008-09-08 ENCOUNTER — Ambulatory Visit: Payer: Self-pay | Admitting: Internal Medicine

## 2008-09-08 LAB — CONVERTED CEMR LAB
AST: 16 units/L (ref 0–37)
Alkaline Phosphatase: 71 units/L (ref 39–117)
BUN: 21 mg/dL (ref 6–23)
Bilirubin, Direct: 0.2 mg/dL (ref 0.0–0.3)
CO2: 25 meq/L (ref 19–32)
Creatinine, Ser: 1.21 mg/dL (ref 0.40–1.50)
Glucose, Bld: 116 mg/dL — ABNORMAL HIGH (ref 70–99)
HDL: 47 mg/dL (ref >39)
Sodium: 140 meq/L (ref 135–145)
Total Bilirubin: 0.9 mg/dL (ref 0.3–1.2)
Triglycerides: 131 mg/dL (ref <150)

## 2008-09-14 ENCOUNTER — Ambulatory Visit: Payer: Self-pay | Admitting: Internal Medicine

## 2008-09-17 ENCOUNTER — Encounter: Payer: Self-pay | Admitting: Internal Medicine

## 2008-09-18 ENCOUNTER — Ambulatory Visit: Payer: Self-pay | Admitting: Hematology & Oncology

## 2008-09-21 ENCOUNTER — Encounter: Payer: Self-pay | Admitting: Internal Medicine

## 2008-09-21 LAB — CBC WITH DIFFERENTIAL (CANCER CENTER ONLY)
Eosinophils Absolute: 0.3 10*3/uL (ref 0.0–0.5)
LYMPH%: 23 % (ref 14.0–48.0)
MCH: 30.7 pg (ref 28.0–33.4)
MCV: 91 fL (ref 82–98)
MONO%: 8.7 % (ref 0.0–13.0)
NEUT%: 62.5 % (ref 40.0–80.0)
Platelets: 222 10*3/uL (ref 145–400)
RBC: 5.05 10*6/uL (ref 4.20–5.70)

## 2008-09-21 LAB — CHCC SATELLITE - SMEAR

## 2008-09-23 ENCOUNTER — Ambulatory Visit: Payer: Self-pay | Admitting: Internal Medicine

## 2008-10-05 DIAGNOSIS — K219 Gastro-esophageal reflux disease without esophagitis: Secondary | ICD-10-CM | POA: Insufficient documentation

## 2008-10-07 ENCOUNTER — Encounter: Payer: Self-pay | Admitting: Cardiology

## 2008-10-07 ENCOUNTER — Ambulatory Visit: Payer: Self-pay | Admitting: Cardiology

## 2008-10-21 ENCOUNTER — Encounter: Payer: Self-pay | Admitting: Cardiology

## 2008-10-21 ENCOUNTER — Ambulatory Visit: Payer: Self-pay | Admitting: Cardiovascular Disease

## 2008-10-21 ENCOUNTER — Telehealth: Payer: Self-pay | Admitting: Cardiology

## 2008-10-21 ENCOUNTER — Ambulatory Visit: Payer: Self-pay

## 2008-10-21 ENCOUNTER — Ambulatory Visit (HOSPITAL_COMMUNITY): Admission: RE | Admit: 2008-10-21 | Discharge: 2008-10-21 | Payer: Self-pay | Admitting: Cardiovascular Disease

## 2008-11-18 ENCOUNTER — Encounter (INDEPENDENT_AMBULATORY_CARE_PROVIDER_SITE_OTHER): Payer: Self-pay | Admitting: *Deleted

## 2008-11-18 ENCOUNTER — Ambulatory Visit: Payer: Self-pay | Admitting: Cardiology

## 2008-11-18 ENCOUNTER — Encounter: Payer: Self-pay | Admitting: Cardiology

## 2008-12-01 ENCOUNTER — Telehealth: Payer: Self-pay | Admitting: Internal Medicine

## 2008-12-11 ENCOUNTER — Ambulatory Visit: Payer: Self-pay | Admitting: Hematology & Oncology

## 2008-12-14 ENCOUNTER — Encounter: Payer: Self-pay | Admitting: Internal Medicine

## 2008-12-14 LAB — CBC WITH DIFFERENTIAL (CANCER CENTER ONLY)
BASO#: 0.1 10*3/uL (ref 0.0–0.2)
Eosinophils Absolute: 0.5 10*3/uL (ref 0.0–0.5)
HGB: 16.8 g/dL (ref 13.0–17.1)
LYMPH%: 23.7 % (ref 14.0–48.0)
MCH: 30.8 pg (ref 28.0–33.4)
MCV: 89 fL (ref 82–98)
MONO%: 8.5 % (ref 0.0–13.0)
NEUT#: 4.8 10*3/uL (ref 1.5–6.5)
RBC: 5.45 10*6/uL (ref 4.20–5.70)

## 2008-12-18 ENCOUNTER — Encounter (INDEPENDENT_AMBULATORY_CARE_PROVIDER_SITE_OTHER): Payer: Self-pay | Admitting: *Deleted

## 2008-12-21 ENCOUNTER — Ambulatory Visit: Payer: Self-pay | Admitting: Gastroenterology

## 2009-01-06 ENCOUNTER — Encounter (INDEPENDENT_AMBULATORY_CARE_PROVIDER_SITE_OTHER): Payer: Self-pay | Admitting: *Deleted

## 2009-01-06 ENCOUNTER — Telehealth: Payer: Self-pay | Admitting: Internal Medicine

## 2009-01-20 ENCOUNTER — Ambulatory Visit: Payer: Self-pay | Admitting: Internal Medicine

## 2009-01-20 ENCOUNTER — Encounter (INDEPENDENT_AMBULATORY_CARE_PROVIDER_SITE_OTHER): Payer: Self-pay | Admitting: *Deleted

## 2009-01-22 ENCOUNTER — Ambulatory Visit: Payer: Self-pay | Admitting: Hematology & Oncology

## 2009-01-28 ENCOUNTER — Encounter: Payer: Self-pay | Admitting: Internal Medicine

## 2009-01-28 LAB — CBC WITH DIFFERENTIAL (CANCER CENTER ONLY)
LYMPH#: 2.1 10*3/uL (ref 0.9–3.3)
MCV: 92 fL (ref 82–98)
NEUT%: 69.4 % (ref 40.0–80.0)
Platelets: 245 10*3/uL (ref 145–400)
RDW: 13.2 % (ref 10.5–14.6)

## 2009-02-04 ENCOUNTER — Ambulatory Visit: Payer: Self-pay | Admitting: Gastroenterology

## 2009-02-04 LAB — HM COLONOSCOPY

## 2009-02-08 ENCOUNTER — Encounter: Payer: Self-pay | Admitting: Gastroenterology

## 2009-02-10 ENCOUNTER — Telehealth: Payer: Self-pay | Admitting: Gastroenterology

## 2009-02-12 ENCOUNTER — Ambulatory Visit: Payer: Self-pay | Admitting: Family

## 2009-03-26 ENCOUNTER — Ambulatory Visit: Payer: Self-pay | Admitting: Internal Medicine

## 2009-04-01 ENCOUNTER — Telehealth: Payer: Self-pay | Admitting: Internal Medicine

## 2009-04-05 ENCOUNTER — Encounter: Payer: Self-pay | Admitting: Internal Medicine

## 2009-04-05 LAB — CONVERTED CEMR LAB
BUN: 20 mg/dL (ref 6–23)
CO2: 22 meq/L (ref 19–32)
Calcium: 9.4 mg/dL (ref 8.4–10.5)
Chloride: 104 meq/L (ref 96–112)
Glucose, Bld: 93 mg/dL (ref 70–99)
Hgb A1c MFr Bld: 6.4 % — ABNORMAL HIGH (ref 4.6–6.1)

## 2009-04-06 ENCOUNTER — Encounter: Payer: Self-pay | Admitting: Internal Medicine

## 2009-04-20 ENCOUNTER — Ambulatory Visit: Payer: Self-pay | Admitting: Hematology & Oncology

## 2009-04-22 ENCOUNTER — Encounter: Payer: Self-pay | Admitting: Internal Medicine

## 2009-04-22 LAB — CBC WITH DIFFERENTIAL (CANCER CENTER ONLY)
BASO#: 0.1 10*3/uL (ref 0.0–0.2)
Eosinophils Absolute: 0.4 10*3/uL (ref 0.0–0.5)
HCT: 46.6 % (ref 38.7–49.9)
HGB: 15.5 g/dL (ref 13.0–17.1)
LYMPH%: 23.9 % (ref 14.0–48.0)
MCH: 30.8 pg (ref 28.0–33.4)
MCV: 93 fL (ref 82–98)
MONO%: 8.2 % (ref 0.0–13.0)
RBC: 5.02 10*6/uL (ref 4.20–5.70)

## 2009-07-12 ENCOUNTER — Ambulatory Visit: Payer: Self-pay | Admitting: Hematology & Oncology

## 2009-07-14 ENCOUNTER — Encounter: Payer: Self-pay | Admitting: Internal Medicine

## 2009-07-14 LAB — CBC WITH DIFFERENTIAL (CANCER CENTER ONLY)
BASO%: 1.5 % (ref 0.0–2.0)
EOS%: 5.4 % (ref 0.0–7.0)
Eosinophils Absolute: 0.4 10*3/uL (ref 0.0–0.5)
LYMPH%: 23 % (ref 14.0–48.0)
MCHC: 33.1 g/dL (ref 32.0–35.9)
MCV: 93 fL (ref 82–98)
MONO#: 0.6 10*3/uL (ref 0.1–0.9)
MONO%: 8.2 % (ref 0.0–13.0)
NEUT%: 61.9 % (ref 40.0–80.0)
RBC: 5.48 10*6/uL (ref 4.20–5.70)

## 2009-07-14 LAB — FERRITIN: Ferritin: 37 ng/mL (ref 22–322)

## 2009-10-04 ENCOUNTER — Encounter: Payer: Self-pay | Admitting: Internal Medicine

## 2009-10-04 LAB — CONVERTED CEMR LAB
ALT: 18 units/L (ref 0–53)
AST: 16 units/L (ref 0–37)
Albumin: 4.3 g/dL (ref 3.5–5.2)
Cholesterol: 122 mg/dL (ref 0–200)
Creatinine, Ser: 1.24 mg/dL (ref 0.40–1.50)
HDL: 41 mg/dL (ref >39)
Potassium: 4.8 meq/L (ref 3.5–5.3)
Sodium: 141 meq/L (ref 135–145)
Total Bilirubin: 0.6 mg/dL (ref 0.3–1.2)
Total CHOL/HDL Ratio: 3
Triglycerides: 83 mg/dL (ref <150)
VLDL: 17 mg/dL (ref 0–40)

## 2009-10-08 ENCOUNTER — Ambulatory Visit: Payer: Self-pay | Admitting: Hematology & Oncology

## 2009-10-11 ENCOUNTER — Encounter: Payer: Self-pay | Admitting: Internal Medicine

## 2009-10-11 LAB — CBC WITH DIFFERENTIAL (CANCER CENTER ONLY)
BASO#: 0.3 10*3/uL — ABNORMAL HIGH (ref 0.0–0.2)
EOS%: 4.8 % (ref 0.0–7.0)
Eosinophils Absolute: 0.4 10*3/uL (ref 0.0–0.5)
HCT: 51.7 % — ABNORMAL HIGH (ref 38.7–49.9)
HGB: 17.4 g/dL — ABNORMAL HIGH (ref 13.0–17.1)
LYMPH%: 30 % (ref 14.0–48.0)
MCHC: 33.6 g/dL (ref 32.0–35.9)
MONO#: 0.9 10*3/uL (ref 0.1–0.9)
MONO%: 11.9 % (ref 0.0–13.0)
NEUT#: 3.7 10*3/uL (ref 1.5–6.5)
NEUT%: 49.4 % (ref 40.0–80.0)
RBC: 5.55 10*6/uL (ref 4.20–5.70)
RDW: 11.8 % (ref 10.5–14.6)
WBC: 7.5 10*3/uL (ref 4.0–10.0)

## 2009-10-12 ENCOUNTER — Ambulatory Visit: Payer: Self-pay | Admitting: Internal Medicine

## 2009-11-08 ENCOUNTER — Telehealth: Payer: Self-pay | Admitting: Internal Medicine

## 2009-11-09 ENCOUNTER — Telehealth: Payer: Self-pay | Admitting: Internal Medicine

## 2009-11-29 ENCOUNTER — Telehealth: Payer: Self-pay | Admitting: Internal Medicine

## 2009-12-29 ENCOUNTER — Telehealth: Payer: Self-pay | Admitting: Internal Medicine

## 2010-01-04 ENCOUNTER — Telehealth: Payer: Self-pay | Admitting: Internal Medicine

## 2010-01-14 ENCOUNTER — Ambulatory Visit: Payer: Self-pay | Admitting: Hematology & Oncology

## 2010-02-01 NOTE — Progress Notes (Signed)
Summary: Mail Order Refills  Phone Note Refill Request Call back at Home Phone [phone] Message from:  Patient on November 08, 2009 2:51 PM  Refills Requested: Medication #1:  SIMVASTATIN 20 MG TABS Take 1 tablet by mouth once a day  Medication #2:  BENICAR 20 MG TABS take 1/2 tablet daily patient called and left voice message requesting refill on Simvastatin and [facility]icar [facility]   Method Requested: Electronic Next Appointment Scheduled: 04/22/2010 Initial call taken by: Glendell Docker CMA,  November 08, 2009 2:53 PM Caller: Patient Call For: Dondra Spry DO  Follow-up for Phone Call        Rx completed in Dr. Tiajuana Amass Follow-up by: Glendell Docker CMA,  November 08, 2009 2:54 PM    Prescriptions: BENICAR 20 MG TABS (OLMESARTAN MEDOXOMIL) take 1/2 tablet daily  #90 x 3   Entered by:   Glendell Docker CMA   Authorized by:   D. Thomos Lemons DO   Signed by:   Glendell Docker CMA on 11/08/2009   Method used:   Electronically to        [facility] ([facility]order)       [address]       [place], [place]  [address]       Ph: [phone]       Fax: [phone]   RxID:   [id number] SIMVASTATIN 20 MG TABS (SIMVASTATIN) Take 1 tablet by mouth once a day  #180 x 3   Entered by:   Glendell Docker CMA   Authorized by:   D. Thomos Lemons DO   Signed by:   Glendell Docker CMA on 11/08/2009   Method used:   Electronically to        [facility] ([facility]order)       [address]       [place], [place]  [address]       Ph: [phone]       Fax: [phone]   RxID:   [id number]

## 2010-02-01 NOTE — Assessment & Plan Note (Signed)
Summary: nasal drip, - jr   Vital Signs:  Patient profile:   70 year old male Height:      72 inches Weight:      210.25 pounds BMI:     28.62 O2 Sat:      99 % on Room air Temp:     97.8 degrees F oral Pulse rate:   52 / minute Pulse rhythm:   regular BP sitting:   120 / 80  (left arm) Cuff size:   large  Vitals Entered By: Glendell Docker CMA (March 26, 2009 2:25 PM)  O2 Flow:  Room air CC: Rm 2- Upper Respiratory   Primary Care Provider:  Dondra Spry DO  CC:  Rm 2- Upper Respiratory.  History of Present Illness: 70 y/o white male for f/u he c/o chronic head congestion, post nasal drip, non- productive cough  no self care measures taken symptoms onset prior to trip to [place], feels better, but still nagged by cough he felt somewhat better after taking amoxicillin  Allergies (verified): No Known Drug Allergies  Past History:  Past Medical History: Current Problems:  GERD (ICD-530.81) MICROALBUMINURIA (ICD-791.0) BACK PAIN, LUMBAR (ICD-724.2)  MICROSCOPIC HEMATURIA (ICD-599.72)  HYPERGLYCEMIA (ICD-790.29) DIVERTICULOSIS, COLON (ICD-562.10) POLYCYTHEMIA RUBRA VERA (ICD-238.4) HYPERTENSION (ICD-401.9) HYPERLIPIDEMIA (ICD-272.4) GOUT (ICD-274.9) COLONIC POLYPS, HX OF (ICD-V12.72) History of GI bleed secondary to aspirin  Past Surgical History: Cataract extraction  Tonsillectomy     Hernia         Family History: Father died of MI at age 37 Mother died at 67 with colon cancer Family History of Colon CA 1st degree relative <60 Family History Diabetes 1st degree relative           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   Ears:  R ear normal and L ear normal.   Nose:  mucosal edema.   Mouth:  postnasal drip.   Lungs:  normal respiratory effort, normal breath  sounds, no crackles, and no wheezes.   Heart:  normal rate, regular rhythm, and no gallop.   Extremities:  No lower extremity edema    Impression & Recommendations:  Problem # 1:  RHINOSINUSITIS, ALLERGIC, CHRONIC (ICD-477.9) He has prev rx for nasacort.  resume once daily.  Patient advised to call office if symptoms persist or worsen.  His updated medication list for this problem includes:    Saline Nasal Spray 0.65 % Soln (Saline) .[name]... 1-3 times a day    Allegra 180 Mg Tabs (Fexofenadine hcl) .[name]... Take 1 tablet by mouth once a day  Problem # 2:  HYPERTENSION (ICD-401.9) stable.  Maintain current medication regimen.  His updated medication list for this problem includes:    Benicar 20 Mg Tabs (Olmesartan medoxomil) .[name]... Take 1/2 tablet daily    Amlodipine Besylate 5 Mg Tabs (Amlodipine besylate) .[name]... Take one tablet by mouth daily  BP today: 120/80 Prior BP: 128/78 (02/12/2009)  Labs Reviewed: K+: 4.9 (09/08/2008) Creat: : 1.21 (09/08/2008)   Chol: 165 (09/08/2008)   HDL: 47 (09/08/2008)   LDL: 92 (09/08/2008)   TG: 131 (09/08/2008)  Complete Medication List: 1)  Co Q-10 75 Mg Caps (Coenzyme q10) .... One by mouth once daily 2)  Low-dose Aspirin 81 Mg Tabs (Aspirin) .... One by mouth once daily 3)  Allopurinol 100 Mg Tabs (Allopurinol) .... Take 1 tablet by mouth once a day 4)  Simvastatin 20 Mg Tabs (Simvastatin) .... Take 1 tablet by mouth once a day 5)  Benicar 20 Mg Tabs (Olmesartan medoxomil) .... Take 1/2 tablet daily 6)  Saline Nasal Spray 0.65 % Soln (Saline) .[name].. 1-3 times a day 7)  Amlodipine Besylate 5 Mg Tabs (Amlodipine besylate) .... Take one tablet by mouth daily 8)  Allegra 180 Mg Tabs (Fexofenadine hcl) .... Take 1 tablet by mouth once a day 9)  Anusol-hc 25 Mg Supp (Hydrocortisone acetate) .... Use daily for 7 days then daily as needed  Current Allergies (reviewed today): No known allergies

## 2010-02-01 NOTE — Progress Notes (Signed)
Summary: Astelin Nasal Spray  Phone Note Call from Patient Call back at Home Phone [phone]   Caller: Patient Call For: D. Thomos Lemons DO Summary of Call: patient called and left voice message requesting a rx for Astelin Nasal Spray [facility]. His message states that he has been using the left over nasal spray that his father inlaw had. Initial call taken by: Glendell Docker CMA,  November 29, 2009 4:59 PM  Follow-up for Phone Call        Pt notified. Nicki Guadalajara Fergerson CMA Duncan Dull)  November 30, 2009 8:56 AM     Prescriptions: AZELASTINE HCL 137 MCG/SPRAY SOLN (AZELASTINE HCL) 2 sprays to each nostril two times a day  #3 x 3   Entered and Authorized by:   D. Thomos Lemons DO   Signed by:   D. Thomos Lemons DO on 11/29/2009   Method used:   Electronically to        [facility] ([facility]order)       [address]       [place], [place]  [address]       Ph: [phone]       Fax: [phone]   RxID:   [id number]

## 2010-02-01 NOTE — Assessment & Plan Note (Signed)
Summary: Sinus problem    hea   Vital Signs:  Patient profile:   70 year old male Weight:      209.75 pounds BMI:     28.55 O2 Sat:      99 % on Room air Temp:     97.9 degrees F oral Pulse rate:   50 / minute Pulse rhythm:   regular Resp:     18 per minute BP sitting:   124 / 70  (right arm) Cuff size:   large  Vitals Entered By: Glendell Docker CMA (January 20, 2009 10:53 AM)  O2 Flow:  Room air  Primary Care Provider:  D. Thomos Lemons DO  CC:  ? Sinus Problems and URI symptoms.  History of Present Illness:  URI Symptoms      This is a 70 year old man who presents with URI symptoms.  The patient reports nasal congestion, but denies purulent nasal discharge, sore throat, and earache.  The patient denies fever.  mild headache on and off.  feels dizzy at times.  dizziness better with use of nasal steroid  Allergies (verified): No Known Drug Allergies  Past History:  Past Medical History: Current Problems:  GERD (ICD-530.81) MICROALBUMINURIA (ICD-791.0) BACK PAIN, LUMBAR (ICD-724.2) MICROSCOPIC HEMATURIA (ICD-599.72)  HYPERGLYCEMIA (ICD-790.29) DIVERTICULOSIS, COLON (ICD-562.10) POLYCYTHEMIA RUBRA VERA (ICD-238.4) HYPERTENSION (ICD-401.9) HYPERLIPIDEMIA (ICD-272.4) GOUT (ICD-274.9) COLONIC POLYPS, HX OF (ICD-V12.72) History of GI bleed secondary to aspirin  Past Surgical History: Cataract extraction  Tonsillectomy     Hernia        Family History: Father died of MI at age 88 Mother died at 19 with colon cancer Family History of Colon CA 1st degree relative <60 Family History Diabetes 1st degree relative          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   Ears:  R ear normal and L ear normal.   Mouth:  pharynx pink and moist.   Lungs:  normal respiratory effort, normal breath sounds, no crackles, and no wheezes.   Heart:   normal rate, regular rhythm, no murmur, and no gallop.     Impression & Recommendations:  Problem # 1:  URI (ICD-465.9)  His updated medication list for this problem includes:    Low-dose Aspirin 81 Mg Tabs (Aspirin) ..... One by mouth once daily    Allegra 180 Mg Tabs (Fexofenadine hcl) .[name]... Take 1 tablet by mouth once a day  Instructed on symptomatic treatment. Call if symptoms persist or worsen.   Complete Medication List: 1)  Co Q-10 75 Mg Caps (Coenzyme q10) .... One by mouth once daily 2)  Low-dose Aspirin 81 Mg Tabs (Aspirin) .... One by mouth once daily 3)  Allopurinol 100 Mg Tabs (Allopurinol) .... Take 1 tablet by mouth once a day 4)  Simvastatin 20 Mg Tabs (Simvastatin) .... Take 1 tablet by mouth once a day 5)  Benicar 20 Mg Tabs (Olmesartan medoxomil) .... Take 1/2 tablet daily 6)  Saline Nasal Spray 0.65 % Soln (Saline) .[name].. 1-3 times a day 7)  Amlodipine Besylate 5 Mg Tabs (Amlodipine besylate) .... Take one tablet by mouth daily 8)  Allegra 180 Mg Tabs (Fexofenadine hcl) .... Take 1 tablet by mouth once a day    Current Allergies (reviewed today): No known allergies

## 2010-02-01 NOTE — Letter (Signed)
[facility]   Imported By: Lanelle Bal 01/20/2009 11:06:47  _____________________________________________________________________  External Attachment:    Type:   Image     Comment:   External Document

## 2010-02-01 NOTE — Letter (Signed)
Summary: Appointment Reminder  [facility]  [address] [place], [place] [address]   Phone: [phone]  Fax: [phone]        January 06, 2009 MRN: [id number]    Alexander [name] [address] [place], [place]  [address]    Dear [name],   We have been unable to reach you by phone to reschedule a Colonoscopy  appointment that was recommended for you by Dr.Stark. It is very   important that we reach you to reschedule this appointment because the   physician will not be available on January 15, 2009. We apologize for   the inconvenience and thank you for allowing  [place] to participate in your   health care needs. Please contact us at [phone] at your earliest   convenience to reschedule your appointment.     Sincerely,  [employer]

## 2010-02-01 NOTE — Letter (Signed)
Summary: [facility]  [facility]   Imported By: Maryln Gottron 10/29/2009 13:34:12  _____________________________________________________________________  External Attachment:    Type:   Image     Comment:   External Document

## 2010-02-01 NOTE — Progress Notes (Signed)
Summary: Benicar clarification  Phone Note Other Incoming   Caller: [facility] Summary of Call: Received call from [facility] wanting verification of pt's Benicar Rx. Directions in EMR say 1/2 tablet daily #90. Pt told pharmacy he is taking 1 tablet daily. Call was disconnected in middle of transfer to pharmacist. Will need to call them back with clarification. Please advise. Nicki Guadalajara Fergerson CMA Duncan Dull)  November 09, 2009 2:52 PM   Follow-up for Phone Call        see new rx Follow-up by: D. Thomos Lemons DO,  November 09, 2009 3:12 PM  Additional Follow-up for Phone Call Additional follow up Details #1::        Spoke to pharmacist at [facility] and verified Benicar should be 1 once daily.  Nicki Guadalajara Fergerson CMA (AAMA)  November 10, 2009 8:11 AM     New/Updated Medications: BENICAR 20 MG TABS (OLMESARTAN MEDOXOMIL) take 1  tablet daily Prescriptions: BENICAR 20 MG TABS (OLMESARTAN MEDOXOMIL) take 1  tablet daily  #90 x 3   Entered and Authorized by:   D. Thomos Lemons DO   Signed by:   D. Thomos Lemons DO on 11/09/2009   Method used:   Electronically to        [facility] ([facility]order)       [address]       [place], [place]  [address]       Ph: [phone]       Fax: [phone]   RxID:   [id number]

## 2010-02-01 NOTE — Progress Notes (Signed)
Summary: swelling   Phone Note Call from Patient Call back at [facility] Phone [phone]   Summary of Call: Patient is requesting an apt soon. He says he has swelling in the pubic area that feels hard to the touch.  Initial call taken by: Lamar Sprinkles,  November 26, 2006 12:38 PM  Follow-up for Phone Call        Appt Scheduled Today Follow-up by: D Thomos Lemons DO,  November 26, 2006 6:06 PM

## 2010-02-01 NOTE — Progress Notes (Signed)
Summary: Amlodipine Refill  Phone Note Refill Request Message from:  Patient  Refills Requested: Medication #1:  AMLODIPINE BESYLATE 5 MG TABS Take one tablet by mouth daily   Dosage confirmed as above?Dosage Confirmed   Brand Name Necessary? No   Supply Requested: 3 months [facility]    Method Requested: Electronic Next Appointment Scheduled: 04-05-2009 8[address] Dr Artist Pais  Initial call taken by: Roselle Locus,  April 01, 2009 8:53 AM  Follow-up for Phone Call        Rx completed in Dr. Tiajuana Amass Follow-up by: Glendell Docker CMA,  April 01, 2009 10:49 AM    Prescriptions: AMLODIPINE BESYLATE 5 MG TABS (AMLODIPINE BESYLATE) Take one tablet by mouth daily  #90 x 3   Entered by:   Glendell Docker CMA   Authorized by:   D. Thomos Lemons DO   Signed by:   Glendell Docker CMA on 04/01/2009   Method used:   Electronically to        [facility] ([facility])       [address]       [place], [place]  [address]       Ph: [phone]       Fax: [phone]   RxID:   [id number]

## 2010-02-01 NOTE — Letter (Signed)
Summary: Patient Notice- Polyp Results  [facility]  [address] [place], [place] [address]   Phone: [phone]  Fax: [phone]        February 08, 2009 MRN: [id number]    JANTHONY HOLLEMAN [address] [place], [place]  [address]    Dear Mr. Broxterman,  I am pleased to inform you that the colon polyp(s) removed during your recent colonoscopy was (were) found to be benign (no cancer detected) upon pathologic examination.  I recommend you have a repeat colonoscopy examination in 3 years to look for recurrent polyps, as having colon polyps increases your risk for having recurrent polyps or even colon cancer in the future.  Should you develop new or worsening symptoms of abdominal pain, bowel habit changes or bleeding from the rectum or bowels, please schedule an evaluation with either your primary care physician or with me.  Continue treatment plan as outlined the day of your exam.  Please call us if you are having persistent problems or have questions about your condition that have not been fully answered at this time.  Sincerely,  Meryl Dare MD [facility]  This letter has been electronically signed by your physician.  Appended Document: Patient Notice- Polyp Results Letter mailed 2.10.11

## 2010-02-01 NOTE — Letter (Signed)
[facility]   Imported By: Lanelle Bal 05/10/2009 11:19:48  _____________________________________________________________________  External Attachment:    Type:   Image     Comment:   External Document

## 2010-02-01 NOTE — Letter (Signed)
[facility]   Imported By: Maryln Gottron 08/10/2009 12:37:17  _____________________________________________________________________  External Attachment:    Type:   Image     Comment:   External Document

## 2010-02-01 NOTE — Progress Notes (Signed)
Summary: path results  Phone Note Call from Patient Call back at Home Phone [phone]   Caller: Patient Call For: Russella Dar Reason for Call: Talk to Nurse Summary of Call: Patient  [place] and would like his biopsy results today please.  Initial call taken by: Tawni Levy,  February 10, 2009 3:44 PM  Follow-up for Phone Call        Pt informed of pathology results. Follow-up by: Ashok Cordia RN,  February 10, 2009 3:49 PM

## 2010-02-01 NOTE — Letter (Signed)
[facility]   Imported By: Lanelle Bal 03/02/2009 12:00:39  _____________________________________________________________________  External Attachment:    Type:   Image     Comment:   External Document

## 2010-02-01 NOTE — Letter (Signed)
   [place] at [facility] [address] [place], [place]  [address]  [place] Phone: [phone]      April 06, 2009   TAREN TOOPS [address] [place], [place] [address]  RE:  LAB RESULTS  Dear  Mr. Lwin,  The following is an interpretation of your most recent lab tests.  Please take note of any instructions provided or changes to medications that have resulted from your lab work.  ELECTROLYTES:  Good - no changes needed    DIABETIC STUDIES:  Good - no changes needed Blood Glucose: 93   HgbA1C: 6.4   Microalbumin/Creatinine Ratio: 26.7          Sincerely Yours,    Dr. Thomos Lemons

## 2010-02-01 NOTE — Miscellaneous (Signed)
Summary: Orders Update  Clinical Lists Changes  Orders: Added new Test order of T-Basic Metabolic Panel ([id number]) - Signed Added new Test order of T-Hepatic Function (80076-22960) - Signed Added new Test order of T-Lipid Profile (80061-22930) - Signed Added new Test order of T- Hemoglobin A1C (83036-23375) - Signed 

## 2010-02-01 NOTE — Procedures (Signed)
Summary: Colonoscopy  Patient: Alexander [name] Note: All result statuses are Final unless otherwise noted.  Tests: (1) Colonoscopy (COL)   COL Colonoscopy           DONE (C)     [facility]     [address]     [place], [place]  [address]           COLONOSCOPY PROCEDURE REPORT           PATIENT:  Alexander, [name]  MR#:  [id number]     BIRTHDATE:  12-Jun-1940, 68 yrs. old  GENDER:  male           ENDOSCOPIST:  Judie Petit T. Russella Dar, MD, [facility]           PROCEDURE DATE:  02/04/2009     PROCEDURE:  Colonoscopy with snare polypectomy, and with hot     biopsy     ASA CLASS:  Class II     INDICATIONS:  1) follow-up of polyp  2) family history of colon     cancer -polyp type unknown. Mother with colon cancer.           MEDICATIONS:   Fentanyl 50 mcg IV, Versed 8 mg IV           DESCRIPTION OF PROCEDURE:   After the risks benefits and     alternatives of the procedure were thoroughly explained, informed     consent was obtained.  Digital rectal exam was performed and     revealed small external hemorrhoids.   The LB [id number]     endoscope was introduced through the anus and advanced to the     cecum, which was identified by both the appendix and ileocecal     valve, without limitations.  The quality of the prep was     excellent, using MoviPrep.  The instrument was then slowly     withdrawn as the colon was fully examined.     <<PROCEDUREIMAGES>>           FINDINGS:  A sessile polyp was found in the cecum. It was 7 mm in     size. Polyp was snared, then cauterized with monopolar cautery.     Retrieval was successful. A sessile polyp was found in the     ascending colon. It was 4 mm in size. With hot biopsy forceps the     polyp was cauterized, biopsy was obtained and sent to pathology.     A sessile polyp was found in the proximal transverse colon. It was     4 mm in size. With hot biopsy forceps the polyp was cauterized,     biopsy was obtained and sent to  pathology.  A sessile polyp was     found in the distal transverse colon. It was 5 mm in size. Polyp     was snared without cautery. Retrieval was successful. There were     two 3 mm sessile polyps in the rectum. With hot biopsy the polyps     were cauterized, biopsies obtained and sent to patholgoy. Moderate     diverticulosis was found sigmoid to descending colon. This was     otherwise a normal examination of the colon.  Retroflexed views in     the rectum revealed internal hemorrhoids, moderate.  The time to     cecum =  1.67  minutes. The scope was then withdrawn (time =     14.67  min)  from the patient and the procedure completed.           COMPLICATIONS:  None           ENDOSCOPIC IMPRESSION:     1) 7 mm sessile polyp in the cecum     2) 4 mm sessile polyp in the ascending colon     3) 4 mm sessile polyp in the proximal transverse colon     4) 5 mm sessile polyp in the distal transverse colon     5) Two 3mm sessile polyps in the rectum     6) Moderate diverticulosis in the sigmoid to descending     7) Internal and external hemorrhoids           RECOMMENDATIONS:     1) No aspirin or NSAID's for 2 weeks     2) Await pathology results     3) Repeat Colonoscopy in 3 years.     4) High fiber diet     5) Hemorrhoid care instructions     6) Anusol HC supp, one PR QD for 7 days the QD prn, #14, 5 refills                 Anglea Gordner T. Russella Dar, MD, [facility]           CC: Thomos Lemons, DO           n.     REVISED:  02/04/2009 02:50 PM     eSIGNED:   Judie Petit T. Mekenna Finau at 02/04/2009 02:50 PM           Nelia Shi, [id number]  Note: An exclamation mark (!) indicates a result that was not dispersed into the flowsheet. Document Creation Date: 02/04/2009 2:50 PM _______________________________________________________________________  (1) Order result status: Final Collection or observation date-time: 02/04/2009 14:36 Requested date-time:  Receipt date-time:  Reported date-time:    Referring Physician:   Ordering Physician: Claudette Head [phone]) Specimen Source:  Source: Launa Grill Order Number: [id number] Lab site:   Appended Document: Colonoscopy     Procedures Next Due Date:    Colonoscopy: 02/2012

## 2010-02-01 NOTE — Assessment & Plan Note (Signed)
Summary: 3 MONTH FOLLOW UP/MHF   Vital Signs:  Patient profile:   70 year old male Weight:      209 pounds BMI:     28.45 O2 Sat:      96 % on Room air Temp:     97.9 degrees F oral Pulse rate:   56 / minute Pulse rhythm:   regular Resp:     18 per minute BP sitting:   100 / 70  (left arm) Cuff size:   large  Vitals Entered By: Glendell Docker CMA (October 12, 2009 10:19 AM)  O2 Flow:  Room air CC: 3 Month Follow up  Is Patient Diabetic? No Pain Assessment Patient in pain? no      Comments c/o light headed, visual changes related to allergy, advised to take antihistamine, evaluation of skin tags, left eye irritation, follow up for urology, evaluation of back of leg    Primary Care [place]:  Dondra Spry DO  CC:  3 Month Follow up .  History of Present Illness: 70 y/o white male for f/u  Hypertension Follow-Up      This is a 70 year old man who presents for Hypertension follow-up.  The patient denies lightheadedness.  The patient denies the following associated symptoms: chest pain.  Compliance with medications (by patient report) has been near 100%.  The patient reports that dietary compliance has been fair.  The patient reports exercising occasionally.    intermittent allergy symptoms - uses nose sprays as needed  Preventive Screening-Counseling & Management  Alcohol-Tobacco     Smoking Status: never  Allergies (verified): No Known Drug Allergies  Past History:  Past Medical History: Current Problems:  GERD (ICD-530.81) MICROALBUMINURIA (ICD-791.0) BACK PAIN, LUMBAR (ICD-724.2)   MICROSCOPIC HEMATURIA (ICD-599.72)  HYPERGLYCEMIA (ICD-790.29) DIVERTICULOSIS, COLON (ICD-562.10) POLYCYTHEMIA RUBRA VERA (ICD-238.4) HYPERTENSION (ICD-401.9) HYPERLIPIDEMIA (ICD-272.4) GOUT (ICD-274.9) COLONIC POLYPS, HX OF (ICD-V12.72) History of GI bleed secondary to aspirin  Past Surgical History: Cataract extraction  Tonsillectomy     Hernia          Social  History: Retired Art gallery manager Married with 3 grown children Never Smoked   Alcohol use-yes          Daughter - Physicist, medical (pediatrician - lives in [place], [place])  Physical Exam  General:  alert, well-developed, and well-nourished.   Lungs:  normal respiratory effort, normal breath sounds, no crackles, and no wheezes.   Heart:  normal rate, regular rhythm, and no gallop.   Extremities:  No lower extremity edema  Neurologic:  cranial nerves II-XII intact and gait normal.     Impression & Recommendations:  Problem # 1:  HYPERTENSION (ICD-401.9) Assessment Unchanged  His updated medication list for this problem includes:    Benicar 20 Mg Tabs (Olmesartan medoxomil) .[name]... Take 1/2 tablet daily    Amlodipine Besylate 5 Mg Tabs (Amlodipine besylate) .[name]... Take one tablet by mouth daily  BP today: 100/70 Prior BP: 120/80 (03/26/2009)  Labs Reviewed: K+: 4.8 (10/04/2009) Creat: : 1.24 (10/04/2009)   Chol: 122 (10/04/2009)   HDL: 41 (10/04/2009)   LDL: 64 (10/04/2009)   TG: 83 (10/04/2009)  Problem # 2:  POLYCYTHEMIA RUBRA VERA (ICD-238.4) Assessment: Unchanged followed by Dr. Myna Hidalgo.  recently had phlebotomy  Problem # 3:  HYPERGLYCEMIA (ICD-790.29) Assessment: Unchanged  Labs Reviewed: Creat: 1.24 (10/04/2009)     Complete Medication List: 1)  Co Q-10 75 Mg Caps (Coenzyme q10) .... One by mouth once daily 2)  Low-dose Aspirin 81 Mg Tabs (Aspirin) .Marland KitchenMarland KitchenMarland Kitchen  One by mouth once daily 3)  Allopurinol 100 Mg Tabs (Allopurinol) .... Take 1 tablet by mouth once a day 4)  Simvastatin 20 Mg Tabs (Simvastatin) .... Take 1 tablet by mouth once a day 5)  Benicar 20 Mg Tabs (Olmesartan medoxomil) .... Take 1/2 tablet daily 6)  Saline Nasal Spray 0.65 % Soln (Saline) .[name].. 1-3 times a day 7)  Amlodipine Besylate 5 Mg Tabs (Amlodipine besylate) .... Take one tablet by mouth daily 8)  Allegra 180 Mg Tabs (Fexofenadine hcl) .... Take 1 tablet by mouth once a day 9)  Azelastine Hcl 137 Mcg/spray Soln  (Azelastine hcl) .... 2 sprays to each nostril two times a day 10)  Nasonex 50 Mcg/act Susp (Mometasone furoate) .... 2 sprays each nostril qd  Patient Instructions: 1)  Please schedule a follow-up appointment in 6 months. 2)  BMP prior to visit, ICD-9:  401.9 3)  HbgA1C prior to visit, ICD-9:  790.29 4)  Please return for lab work one (1) week before your next appointment.  Prescriptions: BENICAR 20 MG TABS (OLMESARTAN MEDOXOMIL) take 1/2 tablet daily  #90 x 3   Entered and Authorized by:   D. Thomos Lemons DO   Signed by:   D. Thomos Lemons DO on 10/12/2009   Method used:   Print then Give to Patient   RxID:   [id number] SIMVASTATIN 20 MG TABS (SIMVASTATIN) Take 1 tablet by mouth once a day  #180 x 3   Entered and Authorized by:   D. Thomos Lemons DO   Signed by:   D. Thomos Lemons DO on 10/12/2009   Method used:   Print then Give to Patient   RxID:   [id number]   Current Allergies (reviewed today): No known allergies

## 2010-02-01 NOTE — Progress Notes (Signed)
Summary: referral to allergist  Phone Note Call from Patient Call back at Home Phone [phone]   Caller: Patient Call For: yoo Summary of Call: on the meds dr Artist Pais gave him but still having problems breathing feels he needs to see an allergist.   Initial call taken by: Roselle Locus,  April 01, 2009 8:54 AM  Follow-up for Phone Call        see referral for allergist.   I also suggest trial of astelin nose spray - see rx Follow-up by: D. Thomos Lemons DO,  April 01, 2009 11:50 AM  Additional Follow-up for Phone Call Additional follow up Details #1::        Appt with [facility] Allegy  scheduled  April 13  pt notified    Pt given rx for nasacort  last visit  do you want him to also use the astelin     Additional Follow-up by: Darral Dash,  April 01, 2009 12:49 PM    Additional Follow-up for Phone Call Additional follow up Details #2::    yes he can use both Follow-up by: D. Thomos Lemons DO,  April 01, 2009 2:18 PM  Additional Follow-up for Phone Call Additional follow up Details #3:: Details for Additional Follow-up Action Taken: Spoke with pt  message as directed  Additional Follow-up by: Darral Dash,  April 01, 2009 3:29 PM  New/Updated Medications: SINGULAIR 10 MG TABS (MONTELUKAST SODIUM) one by mouth once daily AZELASTINE HCL 137 MCG/SPRAY SOLN (AZELASTINE HCL) 2 sprays to each nostril two times a day NASONEX 50 MCG/ACT SUSP (MOMETASONE FUROATE) 2 sprays each nostril qd Prescriptions: NASONEX 50 MCG/ACT SUSP (MOMETASONE FUROATE) 2 sprays each nostril qd  #1 x 2   Entered and Authorized by:   D. Thomos Lemons DO   Signed by:   D. Thomos Lemons DO on 04/01/2009   Method used:   Electronically to        [facility]* (retail)       [address] [place], [place]  [address]       Ph: [phone]       Fax: [phone]   RxID:   [id number] AZELASTINE HCL 137 MCG/SPRAY SOLN (AZELASTINE HCL) 2 sprays to each nostril  two times a day  #1 x 3   Entered and Authorized by:   D. Thomos Lemons DO   Signed by:   D. Thomos Lemons DO on 04/01/2009   Method used:   Electronically to        [facility]* (retail)       [address] [place], [place]  [address]       Ph: [phone]       Fax: [phone]   RxID:   [id number] SINGULAIR 10 MG TABS (MONTELUKAST SODIUM) one by mouth once daily  #30 x 0   Entered and Authorized by:   D. Thomos Lemons DO   Signed by:   D. Thomos Lemons DO on 04/01/2009   Method used:   Samples Given   RxID:   [id number]

## 2010-02-01 NOTE — Assessment & Plan Note (Signed)
Summary: cough, post nasal drip - jr   Vital Signs:  Patient profile:   70 year old male Weight:      207 pounds Temp:     98.2 degrees F oral BP sitting:   128 / 78  (right arm)  Vitals Entered By: Doristine Devoid (February 12, 2009 3:27 PM) CC: sinus drainage and chest congestion using allegra w/ some help but [place].   Primary Care Provider:  Dondra Spry DO  CC:  sinus drainage and chest congestion using allegra w/ some help but [place]  History of Present Illness: Alexander [name] is a 70 year old male who presents with c/o post-nasal drip for several days. Patient is leaving for trip to [place] on Monday.  Patient is using nasocort daily, dexaphenadine.  Notes that his chest has felt tight at night the last few nights- has used proair which resolves his tightness immediately.  No nasal discharge. He has some frontal sinus pressure.  Denies fever.    Allergies: No Known Drug Allergies  Physical Exam  General:  Well-developed,well-nourished,in no acute distress; alert,appropriate and cooperative throughout examination Eyes:  PERRLA Ears:  External ear exam shows no significant lesions or deformities.  Otoscopic examination reveals clear canals, tympanic membranes are intact bilaterally without bulging, retraction, inflammation or discharge. Hearing is grossly normal bilaterally. Mouth:  Oral mucosa and oropharynx without lesions or exudates.  Teeth in good repair. Lungs:  Normal respiratory effort, chest expands symmetrically. Lungs are clear to auscultation, no crackles or wheezes. Heart:  Normal rate and regular rhythm. S1 and S2 normal without gallop, murmur, click, rub or other extra sounds.   Impression & Recommendations:  Problem # 1:  FRONTAL SINUSITIS (ICD-473.1) Assessment New Continue nasal steroid, saline spray, add amoxicillin x 10 days. His updated medication list for this problem includes:    Saline Nasal Spray 0.65 % Soln (Saline)  .[name]... 1-3 times a day    Amoxicillin 500 Mg Cap (Amoxicillin) .[name]... Take 1 capsule by mouth three times a day x 10 days  Complete Medication List: 1)  Co Q-10 75 Mg Caps (Coenzyme q10) .... One by mouth once daily 2)  Low-dose Aspirin 81 Mg Tabs (Aspirin) .... One by mouth once daily 3)  Allopurinol 100 Mg Tabs (Allopurinol) .... Take 1 tablet by mouth once a day 4)  Simvastatin 20 Mg Tabs (Simvastatin) .... Take 1 tablet by mouth once a day 5)  Benicar 20 Mg Tabs (Olmesartan medoxomil) .... Take 1/2 tablet daily 6)  Saline Nasal Spray 0.65 % Soln (Saline) .[name].. 1-3 times a day 7)  Amlodipine Besylate 5 Mg Tabs (Amlodipine besylate) .... Take one tablet by mouth daily 8)  Allegra 180 Mg Tabs (Fexofenadine hcl) .... Take 1 tablet by mouth once a day 9)  Anusol-hc 25 Mg Supp (Hydrocortisone acetate) .... Use daily for 7 days then daily as needed 10)  Amoxicillin 500 Mg Cap (Amoxicillin) .... Take 1 capsule by mouth three times a day x 10 days   Patient Instructions: 1)   Call if you develop fever over 101, increasing sinus pressure, pain with eye movement, increased facial tenderness of swelling, or if you develop visual changes. 2)  Have a nice trip! Prescriptions: AMOXICILLIN 500 MG CAP (AMOXICILLIN) Take 1 capsule by mouth three times a day X 10 days  #30 x 0   Entered and Authorized by:   Lemont Fillers FNP   Signed by:   Lemont Fillers  FNP on 02/12/2009   Method used:   Electronically to        [facility]* (retail)       [address] [place], [place]  [address]       Ph: [phone]       Fax: [phone]   RxID:   [id number]

## 2010-02-01 NOTE — Letter (Signed)
Summary: [facility] Instructions  [facility]  [address] [place], [place] [address]   Phone: [phone]  Fax: [phone]       Alexander [name]    1940-12-27    MRN: [id number]        Procedure Day /Date: 02/04/2009     Arrival Time: 1:00pm     Procedure Time: 2:00pm     Location of Procedure:                    X  [facility] (4th Floor)                        PREPARATION FOR COLONOSCOPY WITH MOVIPREP   Starting 5 days prior to your procedure 01/30/2009 do not eat nuts, seeds, popcorn, corn, beans, peas,  salads, or any raw vegetables.  Do not take any fiber supplements (e.g. Metamucil, Citrucel, and Benefiber).  THE DAY BEFORE YOUR PROCEDURE         DATE: Wednesday  DAY: 02/03/2009  1.  Drink clear liquids the entire day-NO SOLID FOOD  2.  Do not drink anything colored red or purple.  Avoid juices with pulp.  No orange juice.  3.  Drink at least 64 oz. (8 glasses) of fluid/clear liquids during the day to prevent dehydration and help the prep work efficiently.  CLEAR LIQUIDS INCLUDE: Water Jello Ice Popsicles Tea (sugar ok, no milk/cream) Powdered fruit flavored drinks Coffee (sugar ok, no milk/cream) Gatorade Juice: apple, white grape, white cranberry  Lemonade Clear bullion, consomm, broth Carbonated beverages (any kind) Strained chicken noodle soup Hard Candy                             4.  In the morning, mix first dose of MoviPrep solution:    Empty 1 Pouch A and 1 Pouch B into the disposable container    Add lukewarm drinking water to the top line of the container. Mix to dissolve    Refrigerate (mixed solution should be used within 24 hrs)  5.  Begin drinking the prep at 5:00 p.m. The MoviPrep container is divided by 4 marks.   Every 15 minutes drink the solution down to the next mark (approximately 8 oz) until the full liter is complete.   6.  Follow completed prep with 16 oz of clear liquid of your choice (Nothing red  or purple).  Continue to drink clear liquids until bedtime.  7.  Before going to bed, mix second dose of MoviPrep solution:    Empty 1 Pouch A and 1 Pouch B into the disposable container    Add lukewarm drinking water to the top line of the container. Mix to dissolve    Refrigerate  THE DAY OF YOUR PROCEDURE      DATE: 02/04/2009 DAY: Thursday  Beginning at 9:00am (5 hours before procedure):         1. Every 15 minutes, drink the solution down to the next mark (approx 8 oz) until the full liter is complete.  2. Follow completed prep with 16 oz. of clear liquid of your choice.    3. You may drink clear liquids until 12(noon) (2 HOURS BEFORE PROCEDURE).   MEDICATION INSTRUCTIONS  Unless otherwise instructed, you should take regular prescription medications with a small sip of water   as early as possible the morning of your procedure.  OTHER INSTRUCTIONS  You will need a responsible adult at least 70 years of age to accompany you and drive you home.   This person must remain in the waiting room during your procedure.  Wear loose fitting clothing that is easily removed.  Leave jewelry and other valuables at home.  However, you may wish to bring a book to read or  an iPod/MP3 player to listen to music as you wait for your procedure to start.  Remove all body piercing jewelry and leave at home.  Total time from sign-in until discharge is approximately 2-3 hours.  You should go home directly after your procedure and rest.  You can resume normal activities the  day after your procedure.  The day of your procedure you should not:   Drive   Make legal decisions   Operate machinery   Drink alcohol   Return to work  You will receive specific instructions about eating, activities and medications before you leave.    The above instructions have been reviewed and explained to me by   _______________________    I fully understand and can verbalize these  instructions _____________________________ Date _________   patient changed date and this is reprint of instructions with new date and time  Harlow Mares CMA Duncan Dull)  January 20, 2009 10:09 AM

## 2010-02-01 NOTE — Progress Notes (Signed)
Summary: Allegra  Phone Note Call from Patient Call back at Home Phone [phone]   Caller: Patient Summary of Call: patient called and left a voice message requesting a rx to mail order for Allegra. if approved he is requesting a [facility]day supply [facility] Initial call taken by: Glendell Docker CMA,  January 06, 2009 6:52 PM  Follow-up for Phone Call         okay to send Rx Follow-up by: D. Thomos Lemons DO,  January 06, 2009 7:07 PM  Additional Follow-up for Phone Call Additional follow up Details #1::        Phone Call Completed, rx sent to pharmacy, patient advised Additional Follow-up by: Glendell Docker CMA,  January 07, 2009 1:16 PM    New/Updated Medications: ALLEGRA 180 MG TABS (FEXOFENADINE HCL) Take 1 tablet by mouth two times a day as needed Prescriptions: ALLEGRA 180 MG TABS (FEXOFENADINE HCL) Take 1 tablet by mouth two times a day as needed  #180 x 2   Entered by:   Glendell Docker CMA   Authorized by:   D. Thomos Lemons DO   Signed by:   Glendell Docker CMA on 01/07/2009   Method used:   Electronically to        [facility]* (mail-order)       [address]       [place], [place]  [address]       Ph: [phone]       Fax: [phone]   RxID:   [id number]   Appended Document: Refill correction    Prescriptions: ALLEGRA 180 MG TABS (FEXOFENADINE HCL) Take 1 tablet by mouth once a day  #90 x 2   Entered by:   Glendell Docker CMA   Authorized by:   D. Thomos Lemons DO   Signed by:   Glendell Docker CMA on 01/08/2009   Method used:   Electronically to        [facility]* (mail-order)       [address]       [place], [place]  [address]       Ph: [phone]       Fax: [phone]   RxID:   [id number]    Rx directions corrected and medication re-submitted to pharmacy Glendell Docker CMA  January 08, 2009 4:55 PM

## 2010-02-01 NOTE — Miscellaneous (Signed)
Summary: Orders Update  Clinical Lists Changes  Orders: Added new Test order of TLB-BMP (Basic Metabolic Panel-BMET) (80048-METABOL) - Signed Added new Test order of TLB-A1C / Hgb A1C (Glycohemoglobin) (83036-A1C) - Signed 

## 2010-02-01 NOTE — Miscellaneous (Signed)
Summary: anusol rx  Clinical Lists Changes  Medications: Added new medication of ANUSOL-HC 25 MG  SUPP (HYDROCORTISONE ACETATE) use daily for 7 days then daily as needed - Signed Rx of ANUSOL-HC 25 MG  SUPP (HYDROCORTISONE ACETATE) use daily for 7 days then daily as needed;  #1 x 5;  Signed;  Entered by: Oda Cogan;  Authorized by: Meryl Dare MD Clementeen Graham;  Method used: Electronically to [facility] [address], [place], [place], [place]  [address], Ph: [phone], Fax: [phone]    Prescriptions: ANUSOL-HC 25 MG  SUPP (HYDROCORTISONE ACETATE) use daily for 7 days then daily as needed  #1 x 5   Entered by:   Oda Cogan   Authorized by:   Meryl Dare MD [facility]   Signed by:   Oda Cogan on 02/04/2009   Method used:   Electronically to        [facility]* (retail)       [address] [place], [place]  [address]       Ph: [phone]       Fax: [phone]   RxID:   [id number]

## 2010-02-03 ENCOUNTER — Encounter: Payer: Self-pay | Admitting: Internal Medicine

## 2010-02-03 NOTE — Progress Notes (Signed)
Summary: refills-- Simvastatin, Benicar  Phone Note Call from Patient Call back at [facility] Phone [phone]   Caller: Patient Call For: D. Thomos Lemons DO Summary of Call: Received message from pt stating he needed refills on: Simvastatin and Benicar. Advised pt refills were sent to [facility] on 11/7 & 11/09/09 #90 x 3 refills on each. Pt states he will call mail order pharmacy. Verified with Cordelia Pen at [facility] that they received refill in November. Nicki Guadalajara Fergerson CMA Duncan Dull)  January 04, 2010 4:22 PM

## 2010-02-03 NOTE — Progress Notes (Signed)
Summary: Allopurinol-Mail Order  Phone Note Refill Request Message from:  Patient on December 29, 2009 10:16 AM  Refills Requested: Medication #1:  ALLOPURINOL 100 MG  TABS Take 1 tablet by mouth once a day   Dosage confirmed as above?Dosage Confirmed   Brand Name Necessary? No   Supply Requested: 3 months [facility] 90 day supply  patient is out.  He as enough for 2 days   He needs it sent asap    Method Requested: Electronic Next Appointment Scheduled: 04-12-10 Dr Artist Pais  Initial call taken by: Roselle Locus,  December 29, 2009 10:18 AM  Follow-up for Phone Call        Rx completed in Dr. Tiajuana Amass Follow-up by: Glendell Docker CMA,  December 29, 2009 10:58 AM    Prescriptions: ALLOPURINOL 100 MG  TABS (ALLOPURINOL) Take 1 tablet by mouth once a day  #90 x 3   Entered by:   Glendell Docker CMA   Authorized by:   D. Thomos Lemons DO   Signed by:   Glendell Docker CMA on 12/29/2009   Method used:   Electronically to        [facility]* (mail-order)       [address]       [place], [place]  [address]       Ph: [phone]       Fax: [phone]   RxID:   [id number]

## 2010-03-17 ENCOUNTER — Ambulatory Visit (INDEPENDENT_AMBULATORY_CARE_PROVIDER_SITE_OTHER): Payer: Medicare Other | Admitting: Internal Medicine

## 2010-03-17 ENCOUNTER — Encounter: Payer: Self-pay | Admitting: Internal Medicine

## 2010-03-17 DIAGNOSIS — H6500 Acute serous otitis media, unspecified ear: Secondary | ICD-10-CM

## 2010-03-21 ENCOUNTER — Ambulatory Visit (INDEPENDENT_AMBULATORY_CARE_PROVIDER_SITE_OTHER): Payer: Medicare Other | Admitting: Internal Medicine

## 2010-03-21 ENCOUNTER — Encounter: Payer: Self-pay | Admitting: Internal Medicine

## 2010-03-21 DIAGNOSIS — J209 Acute bronchitis, unspecified: Secondary | ICD-10-CM | POA: Insufficient documentation

## 2010-03-31 NOTE — Assessment & Plan Note (Signed)
Summary: head congestion/cough/ss   Vital Signs:  Patient profile:   70 year old male Height:      72 inches Weight:      213.25 pounds BMI:     29.03 O2 Sat:      97 % on Room air Temp:     97.9 degrees F oral Pulse rate:   73 / minute Pulse rhythm:   regular Resp:     18 per minute BP sitting:   120 / 86  (left arm) Cuff size:   large  Vitals Entered By: Mervin Kung CMA Duncan Dull) (March 17, 2010 3:40 PM)  O2 Flow:  Room air CC: Pt states he has head congestion since Sunday.  Is Patient Diabetic? No Pain Assessment Patient in pain? no      Comments States he was exposed to Bronchitis 2 weeks ago.  Thinks CoQ10 is 150mg . States he is not using Nasonex but it taking Nasacort.  Nicki Guadalajara Fergerson CMA Duncan Dull)  March 17, 2010 3:46 PM    Primary Care Provider:  Dondra Spry DO  CC:  Pt states he has head congestion since Sunday. [name]  History of Present Illness: 70 y/o c/o sinus congestion symptoms started on  return from trip to [place] onset 1 week nasal secretions area clear ear pain  Preventive Screening-Counseling & Management  Alcohol-Tobacco     Smoking Status: never  Allergies (verified): No Known Drug Allergies  Past History:  Past Medical History: Current Problems:  GERD (ICD-530.81) MICROALBUMINURIA (ICD-791.0) BACK PAIN, LUMBAR (ICD-724.2)    MICROSCOPIC HEMATURIA (ICD-599.72)  HYPERGLYCEMIA (ICD-790.29) DIVERTICULOSIS, COLON (ICD-562.10) POLYCYTHEMIA RUBRA VERA (ICD-238.4) HYPERTENSION (ICD-401.9) HYPERLIPIDEMIA (ICD-272.4) GOUT (ICD-274.9) COLONIC POLYPS, HX OF (ICD-V12.72) History of GI bleed secondary to aspirin  Past Surgical History: Cataract extraction  Tonsillectomy      Hernia          Family History: Father died of MI at age 58 Mother died at 36 with colon cancer Family History of Colon CA 1st degree relative <60 Family History Diabetes 1st degree relative            Physical Exam  General:  alert, well-developed, and  well-nourished.   Ears:  rt TM red and retracted Lungs:  normal respiratory effort.  faint scattered wheezing Heart:  normal rate, regular rhythm, and no gallop.     Impression & Recommendations:  Problem # 1:  OTITIS MEDIA, SEROUS, ACUTE, RIGHT (ICD-381.01) take abx as directed Patient advised to call office if symptoms persist or worsen.  Complete Medication List: 1)  Co Q-10 75 Mg Caps (Coenzyme q10) .... One by mouth once daily 2)  Low-dose Aspirin 81 Mg Tabs (Aspirin) .... One by mouth once daily 3)  Allopurinol 100 Mg Tabs (Allopurinol) .... Take 1 tablet by mouth once a day 4)  Simvastatin 20 Mg Tabs (Simvastatin) .... Take 1 tablet by mouth once a day 5)  Benicar 20 Mg Tabs (Olmesartan medoxomil) .... Take 1  tablet daily 6)  Saline Nasal Spray 0.65 % Soln (Saline) .[name].. 1-3 times a day 7)  Amlodipine Besylate 5 Mg Tabs (Amlodipine besylate) .... Take one tablet by mouth daily 8)  Allegra 180 Mg Tabs (Fexofenadine hcl) .... Take 1 tablet by mouth once a day as needed. 9)  Azelastine Hcl 137 Mcg/spray Soln (Azelastine hcl) .... 2 sprays to each nostril two times a day 10)  Nasacort Aq 55 Mcg/act Aers (Triamcinolone acetonide) .... 2 sprays two times a day. 11)  Cefuroxime Axetil 500  Mg Tabs (Cefuroxime axetil) .... One by mouth two times a day  Patient Instructions: 1)  Call our office if your symptoms do not  improve or gets worse. Prescriptions: CEFUROXIME AXETIL 500 MG TABS (CEFUROXIME AXETIL) one by mouth two times a day  #20 x 0   Entered and Authorized by:   D. Thomos Lemons DO   Signed by:   D. Thomos Lemons DO on 03/17/2010   Method used:   Electronically to        [facility]* (retail)       [address] [place], [place]  [address]       Ph: [phone]       Fax: [phone]   RxID:   [id number]    Orders Added: 1)  Est. Patient Level III [[address]]    Current Allergies (reviewed today): No known  allergies

## 2010-03-31 NOTE — Assessment & Plan Note (Signed)
Summary: wheezing/mhf   Vital Signs:  Patient profile:   70 year old male Height:      72 inches Weight:      214.75 pounds BMI:     29.23 O2 Sat:      100 % on Room air Temp:     98 degrees F oral Pulse rate:   57 / minute Resp:     18 per minute BP sitting:   114 / 80  (right arm) Cuff size:   large  Vitals Entered By: Glendell Docker CMA (March 21, 2010 4:23 PM)  O2 Flow:  Room air CC: Wheezing Is Patient Diabetic? No Pain Assessment Patient in pain? no      Comments c/o chest congestion and weezing, antibiotic has helped some,non-productive to productive cough, increase in fatigue   Primary Care Provider:  Dondra Spry DO  CC:  Wheezing.  History of Present Illness: 70 y/o male recently seen for right otitis media for follow up ear feeling better but wife worried about progressive cough and wheezing symptoms worse at night   Preventive Screening-Counseling & Management  Alcohol-Tobacco     Smoking Status: never  Allergies (verified): No Known Drug Allergies  Past History:  Past Medical History: Current Problems:  GERD (ICD-530.81) MICROALBUMINURIA (ICD-791.0)  BACK PAIN, LUMBAR (ICD-724.2)   MICROSCOPIC HEMATURIA (ICD-599.72)  HYPERGLYCEMIA (ICD-790.29) DIVERTICULOSIS, COLON (ICD-562.10) POLYCYTHEMIA RUBRA VERA (ICD-238.4) HYPERTENSION (ICD-401.9) HYPERLIPIDEMIA (ICD-272.4) GOUT (ICD-274.9) COLONIC POLYPS, HX OF (ICD-V12.72) History of GI bleed secondary to aspirin  Past Surgical History: Cataract extraction  Tonsillectomy      Hernia          Family History: Father died of MI at age 84 Mother died at 65 with colon cancer Family History of Colon CA 1st degree relative <60 Family History Diabetes 1st degree relative            Social History: Retired Art gallery manager  Married with 3 grown children Never Smoked   Alcohol use-yes          Daughter - Physicist, medical (pediatrician - lives in [place], [place])  Physical Exam  General:  alert,  well-developed, and well-nourished.   Lungs:  normal respiratory effort.  bilateral wheezing Left greater than right.  coarse breath sounds L > R Heart:  normal rate, regular rhythm, and no gallop.     Impression & Recommendations:  Problem # 1:  BRONCHITIS, ACUTE (ICD-466.0)  The following medications were removed from the medication list:    Cefuroxime Axetil 500 Mg Tabs (Cefuroxime axetil) ..... One by mouth two times a day His updated medication list for this problem includes:    Avelox 400 Mg Tabs (Moxifloxacin hcl) ..... One by mouth once daily    Dulera 200-5 Mcg/act Aero (Mometasone furo-formoterol fum) .[name]... 2 puffs two times a day  Take antibiotics and other medications as directed. Encouraged to push clear liquids, get enough rest, and take acetaminophen as needed. To be seen in 5-7 days if no improvement, sooner if worse.  Problem # 2:  HYPERGLYCEMIA (ICD-790.29) pt given glucometer - free style with instruction pt to monitor blood sugar just in case prednisone causes significant hyperglycemia  Complete Medication List: 1)  Co Q-10 75 Mg Caps (Coenzyme q10) .... One by mouth once daily 2)  Low-dose Aspirin 81 Mg Tabs (Aspirin) .... One by mouth once daily 3)  Allopurinol 100 Mg Tabs (Allopurinol) .... Take 1 tablet by mouth once a day 4)  Simvastatin 20 Mg Tabs (Simvastatin) .... Take  1 tablet by mouth once a day 5)  Benicar 20 Mg Tabs (Olmesartan medoxomil) .... Take 1  tablet daily 6)  Saline Nasal Spray 0.65 % Soln (Saline) .[name].. 1-3 times a day 7)  Amlodipine Besylate 5 Mg Tabs (Amlodipine besylate) .... Take one tablet by mouth daily 8)  Allegra 180 Mg Tabs (Fexofenadine hcl) .... Take 1 tablet by mouth once a day as needed. 9)  Azelastine Hcl 137 Mcg/spray Soln (Azelastine hcl) .... 2 sprays to each nostril two times a day 10)  Nasacort Aq 55 Mcg/act Aers (Triamcinolone acetonide) .... 2 sprays two times a day. 11)  Avelox 400 Mg Tabs (Moxifloxacin hcl) .... One by  mouth once daily 12)  Prednisone 20 Mg Tabs (Prednisone) .... One by mouth once daily x 5 days then 1/2 tab by mouth x 5 days 13)  Dulera 200-5 Mcg/act Aero (Mometasone furo-formoterol fum) .... 2 puffs two times a day  Patient Instructions: 1)  Call our office if your symptoms do not  improve or gets worse. Prescriptions: DULERA 200-5 MCG/ACT AERO (MOMETASONE FURO-FORMOTEROL FUM) 2 puffs two times a day  #1 x 0   Entered and Authorized by:   D. Thomos Lemons DO   Signed by:   D. Thomos Lemons DO on 03/21/2010   Method used:   Samples Given   RxID:   [id number] PREDNISONE 20 MG TABS (PREDNISONE) one by mouth once daily x 5 days then 1/2 tab by mouth x 5 days  #10 x 0   Entered and Authorized by:   D. Thomos Lemons DO   Signed by:   D. Thomos Lemons DO on 03/21/2010   Method used:   Electronically to        [facility]* (retail)       [address] [place], [place]  [address]       Ph: [phone]       Fax: [phone]   RxID:   [id number] AVELOX 400 MG TABS (MOXIFLOXACIN HCL) one by mouth once daily  #10 x 0   Entered and Authorized by:   D. Thomos Lemons DO   Signed by:   D. Thomos Lemons DO on 03/21/2010   Method used:   Electronically to        [facility]* (retail)       [address] [place], [place]  [address]       Ph: [phone]       Fax: [phone]   RxID:   [id number]    Orders Added: 1)  Est. Patient Level III [32355]    Current Allergies (reviewed today): No known allergies

## 2010-04-04 ENCOUNTER — Other Ambulatory Visit: Payer: Self-pay | Admitting: Internal Medicine

## 2010-04-04 DIAGNOSIS — I10 Essential (primary) hypertension: Secondary | ICD-10-CM

## 2010-04-04 DIAGNOSIS — R7309 Other abnormal glucose: Secondary | ICD-10-CM

## 2010-04-04 LAB — BASIC METABOLIC PANEL
CO2: 25 mEq/L (ref 19–32)
Calcium: 9.7 mg/dL (ref 8.4–10.5)
Chloride: 104 mEq/L (ref 96–112)
Creat: 1.39 mg/dL (ref 0.40–1.50)
Glucose, Bld: 105 mg/dL — ABNORMAL HIGH (ref 70–99)

## 2010-04-09 LAB — URINE MICROSCOPIC-ADD ON

## 2010-04-09 LAB — BASIC METABOLIC PANEL
BUN: 19 mg/dL (ref 6–23)
Calcium: 9.9 mg/dL (ref 8.4–10.5)
Chloride: 102 mEq/L (ref 96–112)
Creatinine, Ser: 1.3 mg/dL (ref 0.4–1.5)

## 2010-04-09 LAB — URINALYSIS, ROUTINE W REFLEX MICROSCOPIC
Glucose, UA: NEGATIVE mg/dL
Protein, ur: NEGATIVE mg/dL
pH: 5.5 (ref 5.0–8.0)

## 2010-04-12 ENCOUNTER — Ambulatory Visit: Payer: Medicare Other | Admitting: Internal Medicine

## 2010-04-12 ENCOUNTER — Other Ambulatory Visit: Payer: Self-pay | Admitting: *Deleted

## 2010-04-12 DIAGNOSIS — J302 Other seasonal allergic rhinitis: Secondary | ICD-10-CM

## 2010-04-12 MED ORDER — TRIAMCINOLONE ACETONIDE(NASAL) 55 MCG/ACT NA INHA: 2.0000 | Freq: Every day | NASAL | Status: DC

## 2010-04-12 NOTE — Telephone Encounter (Signed)
Patient called and left voice message requesting a new rx for Nasacort AQ. He states this is not an urgent matter, he uses the nasal spray infrequently and would like to know if Dr Artist Pais would authorize a refill to [facility]

## 2010-04-12 NOTE — Telephone Encounter (Signed)
OK to send 90 day supply 

## 2010-04-12 NOTE — Telephone Encounter (Signed)
Rx for [facility]acort [facility]

## 2010-04-13 NOTE — Telephone Encounter (Signed)
Electronic rx failed, nasal spray phoned in to [facility] to pharmacist Lupita Leash.

## 2010-04-18 ENCOUNTER — Other Ambulatory Visit: Payer: Self-pay | Admitting: *Deleted

## 2010-04-18 NOTE — Telephone Encounter (Signed)
Patient called and left voice message requesting a refill Amlodipine to [facility]. His message states that he has a follow up with Dr Artist Pais on Wednesday, but did not want to wait until then to request the refill.

## 2010-04-20 ENCOUNTER — Ambulatory Visit (INDEPENDENT_AMBULATORY_CARE_PROVIDER_SITE_OTHER): Payer: Medicare Other | Admitting: Internal Medicine

## 2010-04-20 ENCOUNTER — Encounter: Payer: Self-pay | Admitting: Internal Medicine

## 2010-04-20 DIAGNOSIS — I10 Essential (primary) hypertension: Secondary | ICD-10-CM

## 2010-04-20 DIAGNOSIS — J309 Allergic rhinitis, unspecified: Secondary | ICD-10-CM

## 2010-04-20 DIAGNOSIS — E785 Hyperlipidemia, unspecified: Secondary | ICD-10-CM

## 2010-04-20 NOTE — Progress Notes (Signed)
  Subjective:    Patient ID: Alexander [name], male    DOB: 11-26-1940, 70 y.o.   MRN: [id number]  Hypertension This is a chronic problem. The problem is unchanged. The problem is controlled. Pertinent negatives include no chest pain, peripheral edema or shortness of breath. Risk factors for coronary artery disease include male gender, obesity and dyslipidemia. Past treatments include angiotensin blockers and calcium channel blockers. Compliance problems include exercise.  There is no history of heart failure.   Hyperlipidemia - stable  Bronchitis resolved.  Mild elevation in BS while he was on prednisone.  It returned to normal   Review of Systems  Respiratory: Negative for shortness of breath.   Cardiovascular: Negative for chest pain.       Objective:   Physical Exam  Constitutional: He appears well-developed and well-nourished.  HENT:  Head: Normocephalic and atraumatic.  Mouth/Throat: Oropharynx is clear and moist.  Cardiovascular: Normal rate, regular rhythm and normal heart sounds.   Pulmonary/Chest: Effort normal and breath sounds normal. He has no rales.  Musculoskeletal: He exhibits no edema.  Skin: Skin is warm.  Psychiatric: He has a normal mood and affect. His behavior is normal.          Assessment & Plan:

## 2010-04-20 NOTE — Patient Instructions (Signed)
Increase your fluid intake BMET, A1c - 401.9

## 2010-04-21 MED ORDER — AMLODIPINE BESYLATE 5 MG PO TABS: 5.0000 mg | ORAL_TABLET | Freq: Every day | ORAL | Status: DC

## 2010-04-21 NOTE — Telephone Encounter (Signed)
Refill sent to [facility] as indicated by pt at visit on 04/20/10.

## 2010-04-22 ENCOUNTER — Ambulatory Visit: Payer: PRIVATE HEALTH INSURANCE | Admitting: Internal Medicine

## 2010-04-25 ENCOUNTER — Other Ambulatory Visit: Payer: Self-pay | Admitting: Hematology & Oncology

## 2010-04-25 ENCOUNTER — Telehealth (INDEPENDENT_AMBULATORY_CARE_PROVIDER_SITE_OTHER): Payer: Medicare Other | Admitting: Internal Medicine

## 2010-04-25 ENCOUNTER — Encounter (HOSPITAL_BASED_OUTPATIENT_CLINIC_OR_DEPARTMENT_OTHER): Payer: Medicare Other | Admitting: Hematology & Oncology

## 2010-04-25 DIAGNOSIS — I10 Essential (primary) hypertension: Secondary | ICD-10-CM

## 2010-04-25 DIAGNOSIS — D45 Polycythemia vera: Secondary | ICD-10-CM

## 2010-04-25 LAB — CBC WITH DIFFERENTIAL (CANCER CENTER ONLY)
BASO%: 0.7 % (ref 0.0–2.0)
LYMPH%: 20.2 % (ref 14.0–48.0)
MCH: 31.4 pg (ref 28.0–33.4)
MCV: 90 fL (ref 82–98)
MONO#: 0.9 10*3/uL (ref 0.1–0.9)
MONO%: 11.7 % (ref 0.0–13.0)
NEUT#: 4.8 10*3/uL (ref 1.5–6.5)
Platelets: 220 10*3/uL (ref 145–400)
RDW: 13.7 % (ref 11.1–15.7)
WBC: 7.7 10*3/uL (ref 4.0–10.0)

## 2010-04-25 LAB — CHCC SATELLITE - SMEAR

## 2010-04-25 MED ORDER — AMLODIPINE BESYLATE 5 MG PO TABS: 5.0000 mg | ORAL_TABLET | Freq: Every day | ORAL | Status: DC

## 2010-04-25 NOTE — Telephone Encounter (Signed)
Rx refill sent [facility] for 30 day supply

## 2010-04-25 NOTE — Telephone Encounter (Signed)
[facility] got the fax for his amlodipine and they have put the rx on hold as they say it is a new rx and they need to speak with someone in the office before filling.  He has 2 pills.  Do you have some samples and if not please call in a 30 day supply to [facility] on [address]  Please advise patient [phone]

## 2010-04-27 ENCOUNTER — Telehealth (INDEPENDENT_AMBULATORY_CARE_PROVIDER_SITE_OTHER): Payer: Medicare Other | Admitting: Internal Medicine

## 2010-04-27 DIAGNOSIS — I10 Essential (primary) hypertension: Secondary | ICD-10-CM

## 2010-04-27 MED ORDER — AMLODIPINE BESYLATE 5 MG PO TABS: 5.0000 mg | ORAL_TABLET | Freq: Every day | ORAL | Status: DC

## 2010-04-27 NOTE — Telephone Encounter (Signed)
Rx refill for Amlodipine [facility] with refills per patient request

## 2010-04-27 NOTE — Telephone Encounter (Signed)
HE FINALLY GOT HIS AMLODIPINE FROM [facility].  HOWEVER [facility] IS NOW SHOWING THE RX AS FILLED WITH NO REFILLS.  PLEASE SEND SOME REFILLS

## 2010-04-29 ENCOUNTER — Encounter: Payer: Self-pay | Admitting: Internal Medicine

## 2010-04-29 DIAGNOSIS — J309 Allergic rhinitis, unspecified: Secondary | ICD-10-CM | POA: Insufficient documentation

## 2010-04-29 NOTE — Assessment & Plan Note (Signed)
Well controlled.   Continue current medication regimen. Lab Results  Component Value Date   CREATININE 1.39 04/04/2010  Pt encouraged to drink at least 3-4 glasses of water per day. Wife reports pt does not drink much water  He understands to avoid OTC NSAIDs

## 2010-04-29 NOTE — Assessment & Plan Note (Signed)
LDL at goal.  No change in medication   Lab Results  Component Value Date   CHOL 122 10/04/2009   CHOL 165 09/08/2008   CHOL 110 03/27/2008   Lab Results  Component Value Date   HDL 41 10/04/2009   HDL 47 0/04/5407   HDL 35.10* 03/27/2008   Lab Results  Component Value Date   LDLCALC 64 10/04/2009   LDLCALC 92 09/08/2008   LDLCALC 57 03/27/2008   Lab Results  Component Value Date   TRIG 83 10/04/2009   TRIG 131 09/08/2008   TRIG 91.0 03/27/2008   Lab Results  Component Value Date   CHOLHDL 3.0 Ratio 10/04/2009   CHOLHDL 3.5 Ratio 09/08/2008   CHOLHDL 3 03/27/2008

## 2010-04-29 NOTE — Assessment & Plan Note (Signed)
Use nasal steroids as directed. Pt also using asteline NS as needed

## 2010-06-10 ENCOUNTER — Encounter: Payer: Self-pay | Admitting: Family Medicine

## 2010-06-10 ENCOUNTER — Ambulatory Visit (INDEPENDENT_AMBULATORY_CARE_PROVIDER_SITE_OTHER): Payer: Medicare Other | Admitting: Family Medicine

## 2010-06-10 ENCOUNTER — Telehealth: Payer: Self-pay

## 2010-06-10 DIAGNOSIS — I10 Essential (primary) hypertension: Secondary | ICD-10-CM

## 2010-06-10 DIAGNOSIS — M545 Low back pain, unspecified: Secondary | ICD-10-CM

## 2010-06-10 NOTE — Patient Instructions (Signed)
Back Pain (Lumbosacral Strain) Back pain is one of the most common causes of pain. There are many causes of back pain. Most are not serious conditions.  CAUSES Your backbone (spinal column) is made up of 24 main vertebral bodies, the sacrum, and the coccyx. These are held together by muscles and tough, fibrous tissue (ligaments). Nerve roots pass through the openings between the vertebrae. A sudden move or injury to the back may cause injury to, or pressure on, these nerves. This may result in localized back pain or pain movement (radiation) into the buttocks, down the leg, and into the foot. Sharp, shooting pain from the buttock down the back of the leg (sciatica) is frequently associated with a ruptured (herniated) disc. Pain may be caused by muscle spasm alone. Your caregiver can often find the cause of your pain by the details of your symptoms and an exam. In some cases, you may need tests (such as X-rays). Your caregiver will work with you to decide if any tests are needed based on your specific exam. HOME CARE INSTRUCTIONS  Avoid an underactive lifestyle. Active exercise, as directed by your caregiver, is your greatest weapon against back pain.   Avoid hard physical activities (tennis, racquetball, water-skiing) if you are not in proper physical condition for it. This may aggravate and/or create problems.   If you have a back problem, avoid sports requiring sudden body movements. Swimming and walking are generally safer activities.   Maintain good posture.   Avoid becoming overweight (obese).   Use bed rest for only the most extreme, sudden (acute) episode. Your caregiver will help you determine how much bed rest is necessary.   For acute conditions, you may put ice on the injured area.   Put ice in a plastic bag.   Place a towel between your skin and the bag.   Leave the ice on for 15 minutes at a time, every 2 hours, or as needed.   After you are improved and more active, it may  help to apply heat for 30 minutes before activities.  See your caregiver if you are having pain that lasts longer than expected. Your caregiver can advise appropriate exercises and/or therapy if needed. With conditioning, most back problems can be avoided. SEEK IMMEDIATE MEDICAL CARE IF:  You have numbness, tingling, weakness, or problems with the use of your arms or legs.   You experience severe back pain not relieved with medicines.   There is a change in bowel or bladder control.   You have increasing pain in any area of the body, including your belly (abdomen).   You notice shortness of breath, dizziness, or feel faint.   You feel sick to your stomach (nauseous), are throwing up (vomiting), or become sweaty.   You notice discoloration of your toes or legs, or your feet get very cold.   Your back pain is getting worse.   You have an oral temperature above 104, not controlled by medicine.  MAKE SURE YOU:   Understand these instructions.   Will watch your condition.   Will get help right away if you are not doing well or get worse.  Document Released: 09/28/2004 Document Re-Released: 03/15/2009 [facility] Patient Information 2011 [place], [place].  Continue to take Robaxin at bed for the next couple days If pain returns in the morning then start Naproxen 220mg  once daily. Take a Nexium in the am and a Ranitidine at night if you start the Naproxen.  Apply moist heat and attempt  gentle stretching if pain recurs.   Given one sample of Lidoderm apply to affected area if pain returns it is good for 12 hours

## 2010-06-10 NOTE — Telephone Encounter (Signed)
Pt called and states he doesn't have Naproxen at home. Pt would like to know if an RX can be sent in or should he just take Aleve? If he can take Aleve how much? Please advise?  Pt advised to get Aleve OTC 220 mg. 1 daily w/ food PRN. If pt tolerates we will increase next week.

## 2010-06-10 NOTE — Telephone Encounter (Signed)
Pt informed

## 2010-06-13 ENCOUNTER — Telehealth: Payer: Self-pay

## 2010-06-13 MED ORDER — LIDOCAINE 5 % EX PTCH: MEDICATED_PATCH | CUTANEOUS | Status: AC

## 2010-06-13 MED ORDER — TRAMADOL HCL 50 MG PO TABS: 50.0000 mg | ORAL_TABLET | Freq: Two times a day (BID) | ORAL | Status: DC | PRN

## 2010-06-13 NOTE — Telephone Encounter (Signed)
lidoderm and tramadol sent to pharm.  Pt aware.

## 2010-06-13 NOTE — Telephone Encounter (Signed)
Patient can have Lidoderm patches, apply 1 patch topically to affected area daily, leave on 12 hours then remove for 12 hours before replacing. #30 with 2 rf. Can take Aleve 220 mg po bid prn pain with food and can have some Tramadol 50mg  tabs 1 tab po bid prn pain, may cause some sedation. #60, 1 rf

## 2010-06-13 NOTE — Telephone Encounter (Signed)
Pt called stating the lidoderm patches really worked and would like an RX sent to pharmacy? And pt would like something sent into the pharmacy for pain? Pt states the Aleve is not helping. Pt states he had his xrays today and doesn't show anything really concerning so he is starting Physical therapy tomorrow.

## 2010-06-14 ENCOUNTER — Encounter: Payer: Self-pay | Admitting: Family Medicine

## 2010-06-14 NOTE — Progress Notes (Signed)
Alexander [name] [id number] 27-Mar-1940 06/14/2010      Progress Note-Follow Up  Subjective  Chief Complaint  Chief Complaint  Patient presents with  . Back Pain    Lower back X 2 days and RLQ before that    HPI  Patient is in today for evaluation of low back pain and posterior right hip pain for the past 2 days since playing several hours of wii bowling, he has no radicular symptoms or incontinence. He has taken a small dose of Ibuprofen and gotten some temporary relief. He haas trouble finding a comfortable position but has been able to get comfortable enough to sleep. Certain twists or turns make the pain much sharper. He denies fevers/chills/malaise/CP/palp/SOB/GI or BU c/o.  Past Medical History  Diagnosis Date  . GERD (gastroesophageal reflux disease)   . Hypertension   . Microalbuminuria   . Lumbar back pain   . Hematuria, microscopic   . Hyperglycemia   . Diverticulosis of colon   . Polycythemia rubra vera   . Hyperlipidemia   . Gout   . Colon polyp   . GI bleed     secondary to Aspirin    Past Surgical History  Procedure Date  . Cataract extraction   . Hernia repair   . Tonsillectomy     Family History  Problem Relation Age of Onset  . Cancer Mother     died at 50  . Heart disease Father 45    MI  . Diabetes      History   Social History  . Marital Status: Married    Spouse Name: N/A    Number of Children: 3  . Years of Education: N/A   Occupational History  . retired Art gallery manager    Social History Main Topics  . Smoking status: Never Smoker   . Smokeless tobacco: Never Used  . Alcohol Use: No  . Drug Use: No  . Sexually Active: Yes -- Male partner(s)   Other Topics Concern  . Not on file   Social History Narrative   Daughter, Elease Hashimoto (pediatrician--lives in [place], [place])    Current Outpatient Prescriptions on File Prior to Visit  Medication Sig Dispense Refill  . allopurinol (ZYLOPRIM) 100 MG tablet Take 100 mg by mouth daily.         [name] amLODipine (NORVASC) 5 MG tablet Take 1 tablet (5 mg total) by mouth daily.  90 tablet  3  . aspirin 81 MG tablet Take 81 mg by mouth daily.        [name] azelastine (ASTELIN) 137 MCG/SPRAY nasal spray 2 sprays by Nasal route 2 (two) times daily. Use in each nostril as directed       . azelastine (OPTIVAR) 0.05 % ophthalmic solution 1 drop 2 (two) times daily.        . Coenzyme Q10 (COQ-10) 75 MG CAPS Take 1 capsule by mouth daily.        [name] olmesartan (BENICAR) 20 MG tablet Take 20 mg by mouth daily.        . Saline (SODIUM CHLORIDE) 0.65 % SOLN 1-3 sprays by Nasal route daily as needed.        . simvastatin (ZOCOR) 20 MG tablet Take 20 mg by mouth daily.        [name] triamcinolone (NASACORT) 55 MCG/ACT nasal inhaler 2 sprays by Nasal route daily.  3 Inhaler  3  . fexofenadine (ALLEGRA) 180 MG tablet Take 180 mg by mouth daily.        [name]  Lactobacillus (ACIDOPHILUS PO) Take by mouth 2 (two) times daily.       . mometasone (NASONEX) 50 MCG/ACT nasal spray 2 sprays by Nasal route daily.          No Known Allergies  Review of Systems  Review of Systems  Constitutional: Negative for fever and malaise/fatigue.  HENT: Negative for congestion.   Eyes: Negative for discharge.  Respiratory: Negative for shortness of breath.   Cardiovascular: Negative for chest pain, palpitations and leg swelling.  Gastrointestinal: Negative for nausea, abdominal pain and diarrhea.  Genitourinary: Negative for dysuria.  Musculoskeletal: Positive for back pain and joint pain. Negative for myalgias and falls.       Low back pain and right posterior hip pain, no radicular symptoms down legs  Skin: Negative for rash.  Neurological: Negative for loss of consciousness and headaches.  Endo/Heme/Allergies: Negative for polydipsia.  Psychiatric/Behavioral: Negative for depression and suicidal ideas. The patient is not nervous/anxious and does not have insomnia.     Objective  BP 144/88  Pulse 50  Temp(Src) 97.7 F  (36.5 C) (Oral)  Ht 6' (1.829 m)  Wt 218 lb (98.884 kg)  BMI 29.57 kg/m2  SpO2 99%  Physical Exam  Physical Exam  Constitutional: He is oriented to person, place, and time and well-developed, well-nourished, and in no distress. No distress.  HENT:  Head: Normocephalic and atraumatic.  Eyes: Conjunctivae are normal.  Neck: Neck supple. No thyromegaly present.  Cardiovascular: Normal rate, regular rhythm and normal heart sounds.   Pulmonary/Chest: Effort normal and breath sounds normal. No respiratory distress. He has no wheezes.  Abdominal: Soft. Bowel sounds are normal. He exhibits no distension and no mass. There is no tenderness.  Musculoskeletal: Normal range of motion. He exhibits no edema and no tenderness.  Neurological: He is alert and oriented to person, place, and time. He displays normal reflexes.  Skin: Skin is warm.  Psychiatric: Memory, affect and judgment normal.    Lab Results  Component Value Date   TSH 1.050 09/08/2008   Lab Results  Component Value Date   WBC 7.7 04/25/2010   HGB 16.6 04/25/2010   HCT 47.7 04/25/2010   MCV 90 04/25/2010   PLT 220 04/25/2010   Lab Results  Component Value Date   CREATININE 1.39 04/04/2010   BUN 23 04/04/2010   NA 139 04/04/2010   K 4.8 04/04/2010   CL 104 04/04/2010   CO2 25 04/04/2010   Lab Results  Component Value Date   ALT 18 10/04/2009   AST 16 10/04/2009   ALKPHOS 83 10/04/2009   BILITOT 0.6 10/04/2009   Lab Results  Component Value Date   CHOL 122 10/04/2009   Lab Results  Component Value Date   HDL 41 10/04/2009   Lab Results  Component Value Date   LDLCALC 64 10/04/2009   Lab Results  Component Value Date   TRIG 83 10/04/2009   Lab Results  Component Value Date   CHOLHDL 3.0 Ratio 10/04/2009     Assessment & Plan  BACK PAIN, LUMBAR Patient in today with c/o increased low back pain worst over the right side/hip. He was playing Wii bowling and has had stiffness in his posterior hip ever since. He notes sharp  pain when he twists or turns in certain directions. He is instructed to take Aleve with food daily for the next couple of days and to add a dose of Zantac every day he does this. He is given one sample of  a Lidoderm patch to use for 12 hours to see if that is helpful and he has a muscle relaxer at home already he will begin to take qhs, apply moist heat bid and he is instructed on some very simple stretches to do bid as well. Call if symptoms worsen.  HYPERTENSION Mild elevation with acute pain, no change in therapy at today's visit.

## 2010-06-14 NOTE — Assessment & Plan Note (Signed)
Patient in today with c/o increased low back pain worst over the right side/hip. He was playing Wii bowling and has had stiffness in his posterior hip ever since. He notes sharp pain when he twists or turns in certain directions. He is instructed to take Aleve with food daily for the next couple of days and to add a dose of Zantac every day he does this. He is given one sample of a Lidoderm patch to use for 12 hours to see if that is helpful and he has a muscle relaxer at home already he will begin to take qhs, apply moist heat bid and he is instructed on some very simple stretches to do bid as well. Call if symptoms worsen.

## 2010-06-14 NOTE — Assessment & Plan Note (Signed)
Mild elevation with acute pain, no change in therapy at today's visit.

## 2010-06-22 ENCOUNTER — Telehealth: Payer: Self-pay | Admitting: Internal Medicine

## 2010-06-22 NOTE — Telephone Encounter (Signed)
Pt states that pharmacy is the same pharmacy he has been using for years. Just under a different name. Fax# [phone]. Old name [facility], new name is [facility].

## 2010-06-22 NOTE — Telephone Encounter (Signed)
Call placed to patient at [phone], no answer. A detailed voice message was left informing patient to return call regarding rx request. A fax number is needed from patient, otherwise he was informed rxs would be printed and mailed to patient to submit to mail order pharmacy for refill

## 2010-06-22 NOTE — Telephone Encounter (Signed)
AMLODIPINE 5 MG QTY 90 TAKE 1 PER DAY  [facility]

## 2010-06-28 NOTE — Telephone Encounter (Signed)
Call placed to [facility] [phone], spoke with pharmacist Seward Grater, she stated requested rx was shipped to patient on June 22 for a qty of 90. No refills needed at this time

## 2010-07-11 ENCOUNTER — Encounter (HOSPITAL_BASED_OUTPATIENT_CLINIC_OR_DEPARTMENT_OTHER): Payer: Medicare Other | Admitting: Hematology & Oncology

## 2010-07-11 ENCOUNTER — Other Ambulatory Visit: Payer: Self-pay | Admitting: Hematology & Oncology

## 2010-07-11 DIAGNOSIS — D45 Polycythemia vera: Secondary | ICD-10-CM

## 2010-07-11 DIAGNOSIS — Z7982 Long term (current) use of aspirin: Secondary | ICD-10-CM

## 2010-07-11 LAB — CBC WITH DIFFERENTIAL (CANCER CENTER ONLY)
BASO#: 0 10*3/uL (ref 0.0–0.2)
EOS%: 5.6 % (ref 0.0–7.0)
HCT: 47.9 % (ref 38.7–49.9)
HGB: 16.9 g/dL (ref 13.0–17.1)
LYMPH%: 22.4 % (ref 14.0–48.0)
MCH: 32.1 pg (ref 28.0–33.4)
MCHC: 35.3 g/dL (ref 32.0–35.9)
MONO%: 11.3 % (ref 0.0–13.0)
NEUT%: 60.3 % (ref 40.0–80.0)

## 2010-07-22 ENCOUNTER — Other Ambulatory Visit: Payer: Self-pay | Admitting: Hematology & Oncology

## 2010-07-22 ENCOUNTER — Encounter (HOSPITAL_BASED_OUTPATIENT_CLINIC_OR_DEPARTMENT_OTHER): Payer: Medicare Other | Admitting: Hematology & Oncology

## 2010-07-22 DIAGNOSIS — D45 Polycythemia vera: Secondary | ICD-10-CM

## 2010-07-22 LAB — CBC WITH DIFFERENTIAL (CANCER CENTER ONLY)
Eosinophils Absolute: 0.4 10*3/uL (ref 0.0–0.5)
LYMPH%: 22.1 % (ref 14.0–48.0)
MCH: 32.1 pg (ref 28.0–33.4)
MCV: 92 fL (ref 82–98)
MONO%: 14.5 % — ABNORMAL HIGH (ref 0.0–13.0)
Platelets: 203 10*3/uL (ref 145–400)
RBC: 5.29 10*6/uL (ref 4.20–5.70)
RDW: 13.3 % (ref 11.1–15.7)

## 2010-08-09 ENCOUNTER — Other Ambulatory Visit: Payer: Self-pay | Admitting: Internal Medicine

## 2010-08-09 DIAGNOSIS — I10 Essential (primary) hypertension: Secondary | ICD-10-CM

## 2010-08-09 DIAGNOSIS — R7309 Other abnormal glucose: Secondary | ICD-10-CM

## 2010-08-09 LAB — BASIC METABOLIC PANEL WITH GFR
BUN: 20 mg/dL (ref 6–23)
CO2: 26 meq/L (ref 19–32)
Calcium: 9.7 mg/dL (ref 8.4–10.5)
Chloride: 104 meq/L (ref 96–112)
Creat: 1.19 mg/dL (ref 0.50–1.35)
Glucose, Bld: 111 mg/dL — ABNORMAL HIGH (ref 70–99)
Potassium: 4.7 meq/L (ref 3.5–5.3)
Sodium: 138 meq/L (ref 135–145)

## 2010-08-10 LAB — HEMOGLOBIN A1C: Hgb A1c MFr Bld: 6.2 % — ABNORMAL HIGH (ref <5.7)

## 2010-08-17 ENCOUNTER — Ambulatory Visit: Payer: Medicare Other | Admitting: Internal Medicine

## 2010-08-22 ENCOUNTER — Encounter: Payer: Self-pay | Admitting: Internal Medicine

## 2010-08-22 ENCOUNTER — Ambulatory Visit (INDEPENDENT_AMBULATORY_CARE_PROVIDER_SITE_OTHER): Payer: Medicare Other | Admitting: Internal Medicine

## 2010-08-22 DIAGNOSIS — E785 Hyperlipidemia, unspecified: Secondary | ICD-10-CM

## 2010-08-22 DIAGNOSIS — I10 Essential (primary) hypertension: Secondary | ICD-10-CM

## 2010-08-22 DIAGNOSIS — R7309 Other abnormal glucose: Secondary | ICD-10-CM

## 2010-08-22 NOTE — Assessment & Plan Note (Signed)
Good med compliance. FLP and LFTs before next office visit

## 2010-08-22 NOTE — Assessment & Plan Note (Signed)
Diet / exercise controlled.  Continue to monitor hemoglobin A1c Q6 months

## 2010-08-22 NOTE — Assessment & Plan Note (Signed)
Well controlled.  Continue current medication regimen. Lab Results  Component Value Date   CREATININE 1.19 08/09/2010   Lab Results  Component Value Date   NA 138 08/09/2010   K 4.7 08/09/2010   CL 104 08/09/2010   CO2 26 08/09/2010

## 2010-08-22 NOTE — Patient Instructions (Signed)
Please complete the following labs 1 week before your next follow up appointment FLP, LFTs - 272.4 HgA1c - 790.29 BMET - 401.9

## 2010-08-22 NOTE — Progress Notes (Signed)
Subjective:    Patient ID: Alexander [name], male    DOB: 19-Dec-1940, 70 y.o.   MRN: [id number]  HPI 70 year old white male with history of polycythemia vera, hypertension, borderline type 2 diabetes for routine followup. Overall patient has been doing very well.    Interval history:  He was seen by Dr. Myna Hidalgo and his hemoglobin was above 17 and hematocrit above 47.  He has been getting periodic phlebotomies Q9 months to one year He notes occasional small bruises on forearms.  His last platelet count was normal. No abnormal bleeding.  Hypertension-stable.  He is taking full dose of Benicar 20 mg and 5 mg of amlodipine once daily.  He denies lightheadedness or dizziness.  Borderline type 2 diabetes-he has been managing with diet alone. His A1c has remained stable.  He does not follow a low carbohydrate diet Lab Results  Component Value Date   HGBA1C 6.2* 08/09/2010   Health maintenance-he contacted his insurance company and they will reimburse for shingles vaccine he will contact us when he is ready.  Review of Systems  Negative for chest pain, he exercises regularly (swimming) He is planning to see dermatologists (Dr. Terri Piedra) for a full skin exam  Past Medical History  Diagnosis Date  . GERD (gastroesophageal reflux disease)   . Hypertension   . Microalbuminuria   . Lumbar back pain   . Hematuria, microscopic   . Hyperglycemia   . Diverticulosis of colon   . Polycythemia rubra vera   . Hyperlipidemia   . Gout   . Colon polyp   . GI bleed     secondary to Aspirin    History   Social History  . Marital Status: Married    Spouse Name: N/A    Number of Children: 3  . Years of Education: N/A   Occupational History  . retired Art gallery manager    Social History Main Topics  . Smoking status: Never Smoker   . Smokeless tobacco: Never Used  . Alcohol Use: No  . Drug Use: No  . Sexually Active: Yes -- Male partner(s)   Other Topics Concern  . Not on file   Social  History Narrative   Daughter, Elease Hashimoto (pediatrician--lives in [place], [place])    Past Surgical History  Procedure Date  . Cataract extraction   . Hernia repair   . Tonsillectomy     Family History  Problem Relation Age of Onset  . Cancer Mother     died at 42  . Heart disease Father 56    MI  . Diabetes      No Known Allergies  Current Outpatient Prescriptions on File Prior to Visit  Medication Sig Dispense Refill  . allopurinol (ZYLOPRIM) 100 MG tablet Take 100 mg by mouth daily.        [name] amLODipine (NORVASC) 5 MG tablet Take 1 tablet (5 mg total) by mouth daily.  90 tablet  3  . aspirin 81 MG tablet Take 81 mg by mouth daily.        [name] azelastine (OPTIVAR) 0.05 % ophthalmic solution 1 drop 2 (two) times daily.        . Coenzyme Q10 (COQ-10) 75 MG CAPS Take 1 capsule by mouth daily.        . mometasone (NASONEX) 50 MCG/ACT nasal [name] 2 sprays by Nasal route daily.        [name] olmesartan (BENICAR) 20 MG tablet Take 20 mg by mouth daily.        [name]  simvastatin (ZOCOR) 20 MG tablet Take 20 mg by mouth daily.        [name] azelastine (ASTELIN) 137 MCG/[name] nasal [name] 2 sprays by Nasal route 2 (two) times daily. Use in each nostril as directed       . fexofenadine (ALLEGRA) 180 MG tablet Take 180 mg by mouth daily.        . Lactobacillus (ACIDOPHILUS PO) Take by mouth 2 (two) times daily.       [name] lidocaine (LIDODERM) 5 % Apply 1 patch topically to affected area daily, leave on 12 hours then remove for 12 hours before replacing.  30 patch  2  . Saline (SODIUM CHLORIDE) 0.65 % SOLN 1-3 sprays by Nasal route daily as needed.        . traMADol (ULTRAM) 50 MG tablet Take 1 tablet (50 mg total) by mouth 2 (two) times daily as needed for pain.  60 tablet  1  . triamcinolone (NASACORT) 55 MCG/ACT nasal inhaler 2 sprays by Nasal route daily.  3 Inhaler  3    BP 120/78  Pulse 60  Temp(Src) 97.8 F (36.6 C) (Oral)  Ht 5\' 11"  (1.803 m)  Wt 215 lb (97.523 kg)  BMI 29.99 kg/m2         Objective:   Physical Exam   Constitutional: Appears well-developed and well-nourished. No distress.  Head: Normocephalic and atraumatic.  Neck: Normal range of motion. Neck supple. No thyromegaly present. No carotid bruit Cardiovascular: Normal rate, regular rhythm and normal heart sounds.  Exam reveals no gallop and no friction rub.   No murmur heard. Pulmonary/Chest: Effort normal and breath sounds normal.  No wheezes. No rales.  Abdominal: Soft. Bowel sounds are normal. No mass. There is no tenderness.  Neurological: Alert. No cranial nerve deficit.  Skin: Skin is warm and dry.  Psychiatric: Normal mood and affect. Behavior is normal.      Assessment & Plan:

## 2010-08-23 ENCOUNTER — Telehealth: Payer: Self-pay | Admitting: *Deleted

## 2010-08-23 NOTE — Telephone Encounter (Signed)
Pt called insurance to see if the zostavax was covered by his insurance.  They will pay all but $35 if it is a written rx for them to take [facility].  Pt wants to know if you can write a rx and they will pick up tomorrow.

## 2010-08-24 MED ORDER — ZOSTER VACCINE LIVE 19400 UNT/0.65ML ~~LOC~~ SOLR: 0.6500 mL | Freq: Once | SUBCUTANEOUS | Status: DC

## 2010-08-24 NOTE — Telephone Encounter (Signed)
rx printed and signed

## 2010-08-24 NOTE — Telephone Encounter (Signed)
rx ready for pick up and patient is aware  

## 2010-09-14 ENCOUNTER — Telehealth: Payer: Self-pay | Admitting: Internal Medicine

## 2010-09-14 MED ORDER — OLMESARTAN MEDOXOMIL 20 MG PO TABS: 20.0000 mg | ORAL_TABLET | Freq: Every day | ORAL | Status: DC

## 2010-09-14 NOTE — Telephone Encounter (Signed)
Pt requesting refill on 90 day Rx of olmesartan (BENICAR) 20 MG tablet. [facility]  Fax [phone]

## 2010-09-14 NOTE — Telephone Encounter (Signed)
rx sent in electronically 

## 2010-10-13 LAB — CBC
HCT: 47.1
Hemoglobin: 16
MCHC: 34.1
MCV: 92.7
RBC: 5.08
WBC: 11 — ABNORMAL HIGH

## 2010-10-13 LAB — BASIC METABOLIC PANEL
CO2: 28
Chloride: 103
Creatinine, Ser: 1.25
GFR calc Af Amer: >60
Potassium: 4.7
Sodium: 141

## 2010-10-13 LAB — DIFFERENTIAL
Basophils Relative: 1
Eosinophils Absolute: 0.2
Lymphs Abs: 2
Monocytes Absolute: 1 — ABNORMAL HIGH
Monocytes Relative: 9
Neutrophils Relative %: 71

## 2010-10-13 LAB — URINALYSIS, ROUTINE W REFLEX MICROSCOPIC
Glucose, UA: NEGATIVE
Hgb urine dipstick: NEGATIVE
Specific Gravity, Urine: 1.023
pH: 5.5

## 2010-10-13 LAB — POCT CARDIAC MARKERS
CKMB, poc: 1.1
Myoglobin, poc: 112

## 2010-11-02 ENCOUNTER — Other Ambulatory Visit: Payer: Self-pay | Admitting: Hematology & Oncology

## 2010-11-02 ENCOUNTER — Encounter (HOSPITAL_BASED_OUTPATIENT_CLINIC_OR_DEPARTMENT_OTHER): Payer: Medicare Other | Admitting: Hematology & Oncology

## 2010-11-02 DIAGNOSIS — Z7982 Long term (current) use of aspirin: Secondary | ICD-10-CM

## 2010-11-02 DIAGNOSIS — D45 Polycythemia vera: Secondary | ICD-10-CM

## 2010-11-02 LAB — CBC WITH DIFFERENTIAL (CANCER CENTER ONLY)
BASO#: 0.1 10*3/uL (ref 0.0–0.2)
Eosinophils Absolute: 0.5 10*3/uL (ref 0.0–0.5)
HGB: 17 g/dL (ref 13.0–17.1)
MCV: 91 fL (ref 82–98)
MONO#: 0.9 10*3/uL (ref 0.1–0.9)
NEUT#: 4.6 10*3/uL (ref 1.5–6.5)
Platelets: 191 10*3/uL (ref 145–400)
RBC: 5.3 10*6/uL (ref 4.20–5.70)
WBC: 7.6 10*3/uL (ref 4.0–10.0)

## 2010-11-02 LAB — FERRITIN: Ferritin: 35 ng/mL (ref 22–322)

## 2010-11-05 ENCOUNTER — Encounter (HOSPITAL_BASED_OUTPATIENT_CLINIC_OR_DEPARTMENT_OTHER): Payer: Self-pay | Admitting: *Deleted

## 2010-11-05 ENCOUNTER — Emergency Department (INDEPENDENT_AMBULATORY_CARE_PROVIDER_SITE_OTHER): Payer: Medicare Other

## 2010-11-05 ENCOUNTER — Emergency Department (HOSPITAL_BASED_OUTPATIENT_CLINIC_OR_DEPARTMENT_OTHER)
Admission: EM | Admit: 2010-11-05 | Discharge: 2010-11-05 | Disposition: A | Discharge status: Home / Self Care | Payer: Medicare Other | Attending: Emergency Medicine | Admitting: Emergency Medicine

## 2010-11-05 DIAGNOSIS — G319 Degenerative disease of nervous system, unspecified: Secondary | ICD-10-CM | POA: Insufficient documentation

## 2010-11-05 DIAGNOSIS — X58XXXA Exposure to other specified factors, initial encounter: Secondary | ICD-10-CM

## 2010-11-05 DIAGNOSIS — M542 Cervicalgia: Secondary | ICD-10-CM | POA: Insufficient documentation

## 2010-11-05 DIAGNOSIS — Z79899 Other long term (current) drug therapy: Secondary | ICD-10-CM | POA: Insufficient documentation

## 2010-11-05 DIAGNOSIS — Y9229 Other specified public building as the place of occurrence of the external cause: Secondary | ICD-10-CM | POA: Insufficient documentation

## 2010-11-05 DIAGNOSIS — S0990XA Unspecified injury of head, initial encounter: Secondary | ICD-10-CM

## 2010-11-05 DIAGNOSIS — W208XXA Other cause of strike by thrown, projected or falling object, initial encounter: Secondary | ICD-10-CM | POA: Insufficient documentation

## 2010-11-05 DIAGNOSIS — S60229A Contusion of unspecified hand, initial encounter: Secondary | ICD-10-CM

## 2010-11-05 DIAGNOSIS — R51 Headache: Secondary | ICD-10-CM

## 2010-11-05 DIAGNOSIS — K219 Gastro-esophageal reflux disease without esophagitis: Secondary | ICD-10-CM | POA: Insufficient documentation

## 2010-11-05 DIAGNOSIS — E785 Hyperlipidemia, unspecified: Secondary | ICD-10-CM | POA: Insufficient documentation

## 2010-11-05 DIAGNOSIS — S6720XA Crushing injury of unspecified hand, initial encounter: Secondary | ICD-10-CM

## 2010-11-05 DIAGNOSIS — M25559 Pain in unspecified hip: Secondary | ICD-10-CM | POA: Insufficient documentation

## 2010-11-05 DIAGNOSIS — I10 Essential (primary) hypertension: Secondary | ICD-10-CM | POA: Insufficient documentation

## 2010-11-05 DIAGNOSIS — M25549 Pain in joints of unspecified hand: Secondary | ICD-10-CM

## 2010-11-05 NOTE — ED Notes (Signed)
Pt reports he had a stack of wire baskets fall on his head while in store- hit in head, right hand and right foot- pt c/o pain in thumb on right hand- family reports pt was "dazed" and not acting his norm

## 2010-11-05 NOTE — ED Provider Notes (Signed)
Scribed for Alexander Octave, MD, the patient was seen in room [facility]. This chart was scribed by [employer]. The patient's care started at 19:11  CSN: [id number] Arrival date & time: 11/05/2010  7:05 PM   First MD Initiated Contact with Patient 11/05/10 1911      Chief Complaint  Patient presents with  . Hand Injury   HPI Alexander [name] is a 70 y.o. male who presents to the Emergency Department complaining of Hand Injury. Per wife, patient had a stack of wire baskets fall on his head while in the store. Reports getting hit in the head, right hand or right foot. He denies loss of consciousness and states that he can remember the event. Wife reports that patient has been acting different from baseline. Patient is in no apparent distress, is alert and oriented. There are no other associated symptoms and no other alleviating or aggravating factors.      Past Medical History  Diagnosis Date  . GERD (gastroesophageal reflux disease)   . Hypertension   . Microalbuminuria   . Lumbar back pain   . Hematuria, microscopic   . Hyperglycemia   . Diverticulosis of colon   . Hyperlipidemia   . Gout   . Colon polyp   . GI bleed     secondary to Aspirin  . Polycythemia rubra vera     Past Surgical History  Procedure Date  . Cataract extraction   . Hernia repair   . Tonsillectomy     Family History  Problem Relation Age of Onset  . Cancer Mother     died at 81  . Heart disease Father 61    MI  . Diabetes      History  Substance Use Topics  . Smoking status: Never Smoker   . Smokeless tobacco: Never Used  . Alcohol Use: No      Review of Systems  Allergies  Review of patient's allergies indicates no known allergies.  Home Medications   Current Outpatient Rx  Name Route Sig Dispense Refill  . ALLOPURINOL 100 MG PO TABS Oral Take 100 mg by mouth daily.      [name] AMLODIPINE BESYLATE 5 MG PO TABS Oral Take 1 tablet (5 mg total) by mouth daily. 90 tablet 3  .  ASPIRIN 81 MG PO TABS Oral Take 81 mg by mouth daily.      . AZELASTINE HCL 137 MCG/SPRAY NA SOLN Nasal Place 2 sprays into the nose 2 (two) times daily. For nasal allergies    . AZELASTINE HCL 0.05 % OP SOLN Both Eyes Place 1 drop into both eyes 2 (two) times daily.     [name] COENZYME Q10 75 MG PO CAPS Oral Take 1 capsule by mouth daily.      . ACIDOPHILUS PO Oral Take by mouth 2 (two) times daily.     . MOMETASONE FUROATE 50 MCG/ACT NA SUSP Nasal 2 sprays by Nasal route daily.      [name] OLMESARTAN MEDOXOMIL 20 MG PO TABS Oral Take 1 tablet (20 mg total) by mouth daily. 90 tablet 1  . SIMVASTATIN 20 MG PO TABS Oral Take 20 mg by mouth daily.      [name] LIDOCAINE 5 % EX PTCH  Apply 1 patch topically to affected area daily, leave on 12 hours then remove for 12 hours before replacing. 30 patch 2    BP 130/89  Pulse 58  Temp(Src) 98.5 F (36.9 C) (Oral)  Resp 20  Ht 6' (  1.829 m)  Wt 215 lb (97.523 kg)  BMI 29.16 kg/m2  SpO2 99%  Physical Exam  Nursing note and vitals reviewed. Constitutional:       Awake, alert, nontoxic appearance with baseline speech for patient.  HENT:  Head: Atraumatic.  Mouth/Throat: No oropharyngeal exudate.  Eyes: EOM are normal. Pupils are equal, round, and reactive to light. Right eye exhibits no discharge. Left eye exhibits no discharge.  Neck: Neck supple.  Cardiovascular: Normal rate and regular rhythm.   No murmur heard. Pulses:      Radial pulses are 2+ on the right side, and 2+ on the left side.  Pulmonary/Chest: Effort normal and breath sounds normal. No stridor. No respiratory distress. He has no wheezes. He has no rales. He exhibits no tenderness.  Abdominal: Soft. Bowel sounds are normal. He exhibits no mass. There is no tenderness. There is no rebound.  Musculoskeletal: He exhibits no tenderness.       Tenderness in the 1st MCP on the right Tenderness in the 1st phalanx on the right No snuff box tenderness No pain with axial loading Cardinal hand  movements are intact  Lymphadenopathy:    He has no cervical adenopathy.  Neurological:       Awake, alert, cooperative and aware of situation; motor strength bilaterally; sensation normal to light touch bilaterally; peripheral visual fields full to confrontation; no facial asymmetry; tongue midline; major cranial nerves appear intact;  Skin: No rash noted.  Psychiatric: He has a normal mood and affect.    ED Course  Procedures   DIAGNOSTIC STUDIES: Oxygen Saturation is 99% on room air, normal by my interpretation.    COORDINATION OF CARE: 19:15 - EDP examined patient at bedside and ordered the following Orders Placed This Encounter  Procedures  . CT Head Wo Contrast  . DG Hand Complete Right  . CT Cervical Spine Wo Contrast    Ct Head Wo Contrast  11/05/2010  *RADIOLOGY REPORT*  Clinical Data: Right head and neck pain following an injury today.  CT HEAD WITHOUT CONTRAST  Technique:  Contiguous axial images were obtained from the base of the skull through the vertex without contrast.  Comparison: None.  Findings: Diffusely enlarged ventricles and subarachnoid spaces. Symmetrical probable prominent perivascular spaces in the basal ganglia bilaterally.  No skull fracture, intracranial hemorrhage or paranasal sinus air-fluid levels.  IMPRESSION:  1.  No skull fracture or intracranial hemorrhage. 2.  Diffuse atrophy.  Original Report Authenticated By: Darrol Angel, M.D.   Ct Cervical Spine Wo Contrast  11/05/2010  *RADIOLOGY REPORT*  Clinical Data: Neck and right hip pain following an injury today.  CT CERVICAL SPINE WITHOUT CONTRAST  Technique:  Multidetector CT imaging of the cervical spine was performed. Multiplanar CT image reconstructions were also generated.  Comparison: Head CT obtained at the same time.  Findings: Reversal of the normal lordosis in the upper cervical spine.  Multilevel degenerative changes.  No prevertebral soft tissue swelling, fractures or subluxations.  Small  amount of bilateral carotid artery calcification.  IMPRESSION:  1.  No fracture or subluxation. 2.  Multilevel degenerative changes and reversal of the normal lordosis in the upper cervical spine. 3.  Mild bilateral carotid artery atheromatous calcification.  Original Report Authenticated By: Darrol Angel, M.D.   Dg Hand Complete Right  11/05/2010  *RADIOLOGY REPORT*  Clinical Data: Crush injury.  Pain.  RIGHT HAND - COMPLETE 3+ VIEW  Comparison: None  Findings: No evidence of fracture or dislocation.  There  are mild degenerative changes at the first carpal metacarpal joint and the PIP joint of the long finger.  IMPRESSION: No acute or traumatic finding.  Mild degenerative changes.  Original Report Authenticated By: Thomasenia Sales, M.D.     MDM: Close head injury from falling wire basket. No loss of consciousness but amnestic to events and acting slow per wife. Patient without complaints except pain over his first metacarpal on the right.   Scribe Attestation I personally performed the services described in this documentation, which was scribed in my presence.  The recorded information has been reviewed and considered.    Alexander Octave, MD 11/05/10 [phone]
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
## 2010-12-20 ENCOUNTER — Other Ambulatory Visit: Payer: Self-pay | Admitting: *Deleted

## 2010-12-20 MED ORDER — SIMVASTATIN 20 MG PO TABS: 20.0000 mg | ORAL_TABLET | Freq: Every day | ORAL | Status: DC

## 2010-12-20 MED ORDER — ALLOPURINOL 100 MG PO TABS: 100.0000 mg | ORAL_TABLET | Freq: Every day | ORAL | Status: DC

## 2011-01-06 ENCOUNTER — Ambulatory Visit (HOSPITAL_BASED_OUTPATIENT_CLINIC_OR_DEPARTMENT_OTHER): Payer: Medicare Other

## 2011-01-06 ENCOUNTER — Other Ambulatory Visit: Payer: Self-pay | Admitting: Hematology & Oncology

## 2011-01-06 ENCOUNTER — Ambulatory Visit (HOSPITAL_BASED_OUTPATIENT_CLINIC_OR_DEPARTMENT_OTHER): Payer: Medicare Other | Admitting: Hematology & Oncology

## 2011-01-06 ENCOUNTER — Other Ambulatory Visit: Payer: Medicare Other | Admitting: Lab

## 2011-01-06 VITALS — BP 115/70 | HR 69 | Temp 97.2°F | Ht 70.0 in | Wt 219.0 lb

## 2011-01-06 DIAGNOSIS — D45 Polycythemia vera: Secondary | ICD-10-CM

## 2011-01-06 DIAGNOSIS — Z7982 Long term (current) use of aspirin: Secondary | ICD-10-CM | POA: Diagnosis not present

## 2011-01-06 LAB — CBC WITH DIFFERENTIAL (CANCER CENTER ONLY)
BASO#: 0.1 10*3/uL (ref 0.0–0.2)
BASO%: 0.5 % (ref 0.0–2.0)
EOS%: 4.1 % (ref 0.0–7.0)
Eosinophils Absolute: 0.4 10*3/uL (ref 0.0–0.5)
HCT: 48.7 % (ref 38.7–49.9)
HGB: 16.6 g/dL (ref 13.0–17.1)
LYMPH#: 1.8 10*3/uL (ref 0.9–3.3)
MCV: 92 fL (ref 82–98)
MONO#: 0.8 10*3/uL (ref 0.1–0.9)
NEUT%: 68.2 % (ref 40.0–80.0)
WBC: 9.2 10*3/uL (ref 4.0–10.0)

## 2011-01-06 NOTE — Progress Notes (Signed)
[name] presents today for phlebotomy per MD orders. Phlebotomy procedure started at 1000 and ended at 1015 500 grams removed. Patient observed for 30 minutes after procedure without any incident. Patient tolerated procedure well. IV needle removed intact.

## 2011-01-06 NOTE — Progress Notes (Signed)
This office note has been dictated.

## 2011-01-06 NOTE — Progress Notes (Signed)
CC:   Barbette Hair. Artist Pais, DO  DIAGNOSIS:  Polycythemia vera, JAK2 negative.  CURRENT THERAPY: 1. Phlebotomy to maintain hematocrit less than 45%. 2. Aspirin 81 mg p.o. daily.  INTERIM HISTORY:  Mr. Bartnick comes in for followup.  We last saw him back in October.  He has had no problems over the holidays.  He is still exercising.  His appetite is quite good.  He has had no nausea and vomiting.  There has been no headache.  He has had no double vision or blurred vision.  He has not noted any problems with rashes.  There has been no leg pain or leg weakness.  PHYSICAL EXAM:  General:  This is a well-developed well-nourished white gentleman in no obvious distress.  Vital Signs:  Temperature 97.2, pulse 69, respiratory rate 14, blood pressure 115/70.  Weight is 219. Head/Neck:  Exam shows a normocephalic, atraumatic skull.  There are no ocular or oral lesions.  There are no palpable cervical or supraclavicular lymph nodes.  Lungs:  Clear bilaterally.  Cardiac: Regular rate and rhythm with a normal S1, S2.  There are no murmurs, rubs or bruits.  Abdomen:  Soft with good bowel sounds.  There is no palpable abdominal mass.  There is no fluid wave.  There is no palpable hepatosplenomegaly.  Extremities:  No clubbing, cyanosis or edema. Neurologic:  Exam shows no focal neurological deficits.  LABORATORY STUDIES:  White cell count is 9.2, hemoglobin 16.6, hematocrit 48.7, platelet count 198.  MCV is 92.  IMPRESSION:  Mr. Bearce is a 71 year old gentleman with polycythemia vera.  He is doing well.  We will have to phlebotomize him.  There is a recent article in the [place] Journal of Medicine which showed a decrease in cardiovascular and cerebrovascular events in patients with a hematocrit maintained below 45%.  We will go ahead and phlebotomize him.  We will then plan to get him back in another 3 months for followup.    ______________________________ Josph Macho, M.D. PRE/MEDQ   D:  01/06/2011  T:  01/06/2011  Job:  [id number]

## 2011-01-31 ENCOUNTER — Other Ambulatory Visit (INDEPENDENT_AMBULATORY_CARE_PROVIDER_SITE_OTHER): Payer: Medicare Other

## 2011-01-31 DIAGNOSIS — N401 Enlarged prostate with lower urinary tract symptoms: Secondary | ICD-10-CM

## 2011-01-31 DIAGNOSIS — E785 Hyperlipidemia, unspecified: Secondary | ICD-10-CM

## 2011-01-31 DIAGNOSIS — N390 Urinary tract infection, site not specified: Secondary | ICD-10-CM | POA: Diagnosis not present

## 2011-01-31 DIAGNOSIS — N138 Other obstructive and reflux uropathy: Secondary | ICD-10-CM | POA: Diagnosis not present

## 2011-01-31 DIAGNOSIS — D649 Anemia, unspecified: Secondary | ICD-10-CM | POA: Diagnosis not present

## 2011-01-31 DIAGNOSIS — E039 Hypothyroidism, unspecified: Secondary | ICD-10-CM | POA: Diagnosis not present

## 2011-01-31 DIAGNOSIS — Z0000 Encounter for general adult medical examination without abnormal findings: Secondary | ICD-10-CM

## 2011-01-31 DIAGNOSIS — I10 Essential (primary) hypertension: Secondary | ICD-10-CM

## 2011-01-31 LAB — CBC WITH DIFFERENTIAL/PLATELET
Basophils Absolute: 0.1 10*3/uL (ref 0.0–0.1)
Eosinophils Relative: 5.9 % — ABNORMAL HIGH (ref 0.0–5.0)
HCT: 47.6 % (ref 39.0–52.0)
Hemoglobin: 16 g/dL (ref 13.0–17.0)
Lymphocytes Relative: 21.6 % (ref 12.0–46.0)
Lymphs Abs: 1.9 10*3/uL (ref 0.7–4.0)
Monocytes Relative: 12.1 % — ABNORMAL HIGH (ref 3.0–12.0)
Neutro Abs: 5.2 10*3/uL (ref 1.4–7.7)
WBC: 8.7 10*3/uL (ref 4.5–10.5)

## 2011-01-31 LAB — HEPATIC FUNCTION PANEL
ALT: 20 U/L (ref 0–53)
AST: 17 U/L (ref 0–37)
Albumin: 4 g/dL (ref 3.5–5.2)
Alkaline Phosphatase: 71 U/L (ref 39–117)
Total Bilirubin: 0.9 mg/dL (ref 0.3–1.2)

## 2011-01-31 LAB — TSH: TSH: 2 u[IU]/mL (ref 0.35–5.50)

## 2011-01-31 LAB — LIPID PANEL
Cholesterol: 131 mg/dL (ref 0–200)
LDL Cholesterol: 79 mg/dL (ref 0–99)
VLDL: 15.6 mg/dL (ref 0.0–40.0)

## 2011-01-31 LAB — POCT URINALYSIS DIPSTICK
Glucose, UA: NEGATIVE
Leukocytes, UA: NEGATIVE
Nitrite, UA: NEGATIVE
Protein, UA: NEGATIVE
Spec Grav, UA: 1.015
Urobilinogen, UA: 0.2

## 2011-01-31 LAB — PSA: PSA: 0.4 ng/mL (ref 0.10–4.00)

## 2011-01-31 LAB — BASIC METABOLIC PANEL
Calcium: 9.3 mg/dL (ref 8.4–10.5)
GFR: 59.97 mL/min — ABNORMAL LOW (ref >60.00)
Potassium: 4.8 mEq/L (ref 3.5–5.1)
Sodium: 142 mEq/L (ref 135–145)

## 2011-02-06 ENCOUNTER — Ambulatory Visit (INDEPENDENT_AMBULATORY_CARE_PROVIDER_SITE_OTHER): Payer: Medicare Other | Admitting: Internal Medicine

## 2011-02-06 ENCOUNTER — Encounter: Payer: Self-pay | Admitting: Internal Medicine

## 2011-02-06 DIAGNOSIS — L258 Unspecified contact dermatitis due to other agents: Secondary | ICD-10-CM

## 2011-02-06 DIAGNOSIS — R7309 Other abnormal glucose: Secondary | ICD-10-CM | POA: Diagnosis not present

## 2011-02-06 DIAGNOSIS — M25519 Pain in unspecified shoulder: Secondary | ICD-10-CM | POA: Diagnosis not present

## 2011-02-06 DIAGNOSIS — L853 Xerosis cutis: Secondary | ICD-10-CM

## 2011-02-06 DIAGNOSIS — I10 Essential (primary) hypertension: Secondary | ICD-10-CM

## 2011-02-06 MED ORDER — TRIAMCINOLONE ACETONIDE 0.1 % EX CREA
TOPICAL_CREAM | Freq: Two times a day (BID) | CUTANEOUS | Status: DC
Start: 2011-02-06 — End: 2011-10-18

## 2011-02-06 NOTE — Progress Notes (Signed)
Subjective:    Patient ID: Alexander [name], male    DOB: 26-Nov-1940, 71 y.o.   MRN: [id number]  HPI  This 71 year old white male with history of hypertension, hyperlipidemia, and polycythemia vera for followup. Overall patient has been doing very well. He reports gaining slight amount of weight since previous visit. He has not been able to exercise on regular basis.  He was seen by his hematologist. He recommends keeping his hematocrit around 45.    Patient complains of intermittent right and sometimes left shoulder discomfort. He does not have any pain with abduction of his arms. He gets occasional tingling/numbness sensation around the skin of right shoulder. He has no prior history of neck injury but was told by a previous physician that he had a herniated cervical disc.  Review of Systems Negative for chest pain.  Negative for upper extremity weakness    Past Medical History  Diagnosis Date  . GERD (gastroesophageal reflux disease)   . Hypertension   . Microalbuminuria   . Lumbar back pain   . Hematuria, microscopic   . Hyperglycemia   . Diverticulosis of colon   . Hyperlipidemia   . Gout   . Colon polyp   . GI bleed     secondary to Aspirin  . Polycythemia rubra vera     History   Social History  . Marital Status: Married    Spouse Name: N/A    Number of Children: 3  . Years of Education: N/A   Occupational History  . retired Art gallery manager    Social History Main Topics  . Smoking status: Never Smoker   . Smokeless tobacco: Never Used  . Alcohol Use: No  . Drug Use: No  . Sexually Active: Yes -- Male partner(s)   Other Topics Concern  . Not on file   Social History Narrative   Daughter, Elease Hashimoto (pediatrician--lives in [place], [place])    Past Surgical History  Procedure Date  . Cataract extraction   . Hernia repair   . Tonsillectomy     Family History  Problem Relation Age of Onset  . Cancer Mother     died at 56  . Heart disease Father 89    MI    . Diabetes      No Known Allergies  Current Outpatient Prescriptions on File Prior to Visit  Medication Sig Dispense Refill  . allopurinol (ZYLOPRIM) 100 MG tablet Take 1 tablet (100 mg total) by mouth daily.  90 tablet  1  . amLODipine (NORVASC) 5 MG tablet Take 1 tablet (5 mg total) by mouth daily.  90 tablet  3  . aspirin 81 MG tablet Take 81 mg by mouth daily.        [name] azelastine (ASTELIN) 137 MCG/[name] nasal [name] Place 2 sprays into the nose 2 (two) times daily. For nasal allergies      . azelastine (OPTIVAR) 0.05 % ophthalmic solution Place 1 drop into both eyes 2 (two) times daily.       . Coenzyme Q10 (COQ-10) 75 MG CAPS Take 1 capsule by mouth daily.        . mometasone (NASONEX) 50 MCG/ACT nasal [name] 2 sprays by Nasal route daily.        . Multiple Vitamin (MULTIVITAMIN) capsule Take 1 capsule by mouth daily.        [name] olmesartan (BENICAR) 20 MG tablet Take 1 tablet (20 mg total) by mouth daily.  90 tablet  1  . simvastatin (ZOCOR) 20  MG tablet Take 1 tablet (20 mg total) by mouth daily.  90 tablet  1  . Lactobacillus (ACIDOPHILUS PO) Take by mouth 2 (two) times daily.       [name] lidocaine (LIDODERM) 5 % Apply 1 patch topically to affected area daily, leave on 12 hours then remove for 12 hours before replacing.  30 patch  2    BP 126/84  Pulse 60  Temp(Src) 97 F (36.1 C) (Oral)  Wt 217 lb (98.431 kg)    Objective:   Physical Exam  Constitutional: He appears well-developed and well-nourished.  Cardiovascular: Normal rate, regular rhythm and normal heart sounds.   Pulmonary/Chest: Effort normal and breath sounds normal.  Skin: Skin is warm.       Dry scaly patch - right upper arm  Psychiatric: He has a normal mood and affect. His behavior is normal.          Assessment & Plan:

## 2011-02-06 NOTE — Patient Instructions (Signed)
Please perform upper back exercises as directed

## 2011-02-06 NOTE — Assessment & Plan Note (Signed)
Continue to monitor A1c. Patient encouraged to join a local gym to continue exercising during winter months.

## 2011-02-06 NOTE — Assessment & Plan Note (Signed)
Patient has patch of dry skin right upper arm. Patient advised to use triamcinolone cream as directed.  Patient advised to call office if symptoms persist or worsen.

## 2011-02-06 NOTE — Assessment & Plan Note (Signed)
Stable.   Continue current medication regimen. BP: 126/84 mmHg

## 2011-02-06 NOTE — Assessment & Plan Note (Signed)
Patient complains of intermittent shoulder/upper back pain. I suspect his symptoms are radiating from cervical spine. I recommended trial of physical therapy/upper back exercises for now. His symptoms improve with use of over-the-counter Tylenol. He wants to avoid use of NSAIDs due to his history of mild renal insufficiency.

## 2011-02-13 ENCOUNTER — Telehealth: Payer: Self-pay | Admitting: Internal Medicine

## 2011-02-13 MED ORDER — HYDROCORTISONE 2.5 % RE CREA: TOPICAL_CREAM | Freq: Two times a day (BID) | RECTAL | Status: DC

## 2011-02-13 NOTE — Telephone Encounter (Signed)
Ok for refill x2 

## 2011-02-13 NOTE — Telephone Encounter (Signed)
rx sent in electronically 

## 2011-02-13 NOTE — Telephone Encounter (Signed)
Pt called and said that he needs a new script for Proctozone-HC 2.5% sent in to [facility] order pharmacy. Pt said that it has been yrs since Dr Artist Pais prescribed this for pt.

## 2011-03-27 DIAGNOSIS — B351 Tinea unguium: Secondary | ICD-10-CM | POA: Diagnosis not present

## 2011-03-27 DIAGNOSIS — M79609 Pain in unspecified limb: Secondary | ICD-10-CM | POA: Diagnosis not present

## 2011-04-07 ENCOUNTER — Ambulatory Visit (HOSPITAL_BASED_OUTPATIENT_CLINIC_OR_DEPARTMENT_OTHER): Payer: Medicare Other

## 2011-04-07 ENCOUNTER — Ambulatory Visit (HOSPITAL_BASED_OUTPATIENT_CLINIC_OR_DEPARTMENT_OTHER): Payer: Medicare Other | Admitting: Hematology & Oncology

## 2011-04-07 ENCOUNTER — Other Ambulatory Visit (HOSPITAL_BASED_OUTPATIENT_CLINIC_OR_DEPARTMENT_OTHER): Payer: Medicare Other | Admitting: Lab

## 2011-04-07 VITALS — BP 127/78 | HR 72 | Temp 97.0°F | Ht 70.0 in | Wt 225.0 lb

## 2011-04-07 DIAGNOSIS — D45 Polycythemia vera: Secondary | ICD-10-CM

## 2011-04-07 LAB — CBC WITH DIFFERENTIAL (CANCER CENTER ONLY)
BASO%: 0.6 % (ref 0.0–2.0)
EOS%: 5.1 % (ref 0.0–7.0)
Eosinophils Absolute: 0.4 10*3/uL (ref 0.0–0.5)
LYMPH%: 20.8 % (ref 14.0–48.0)
MCH: 31.5 pg (ref 28.0–33.4)
MCHC: 34.1 g/dL (ref 32.0–35.9)
MCV: 92 fL (ref 82–98)
MONO%: 12.7 % (ref 0.0–13.0)
NEUT#: 4.9 10*3/uL (ref 1.5–6.5)
Platelets: 189 10*3/uL (ref 145–400)
RDW: 13.3 % (ref 11.1–15.7)

## 2011-04-07 NOTE — Progress Notes (Signed)
Phlebotomy per left antecubical for 500 Gms of blood with difficulity.  #16G needle used.  Pt tolerated well.  Refreshments taken.  Pt discharged to home

## 2011-04-07 NOTE — Progress Notes (Signed)
This office note has been dictated.

## 2011-04-08 NOTE — Progress Notes (Signed)
CC:   Barbette Hair. Artist Pais, DO  DIAGNOSIS:  Polycythemia vera, JAK2 negative.  CURRENT THERAPY: 1. Phlebotomy to maintain hematocrit below 46%. 2. Aspirin 81 mg p.o. daily.  INTERVAL HISTORY:  Mr. Crumpler comes in for his followup.  He is doing well.  He has had no problems.  He and his wife are now great grandparents.  Their granddaughter in [place] had twins.  He has had no problems with headache.  There is no nausea or vomiting. He has had a good appetite.  He has had no fever, sweats or chills.  He has had no rashes.  When we last saw him, his ferritin was 35.  PHYSICAL EXAMINATION:  This is a well-developed well-nourished white gentleman in no obvious distress.  Vital signs:  Temperature of 97, pulse 72, respiratory rate 18, blood pressure 127/78.  Weight is 225. Head and neck exam shows a normocephalic, atraumatic skull.  There are no ocular or oral lesions.  No palpable cervical or supraclavicular lymph nodes.  Lungs:  Clear bilaterally.  Cardiac:  Regular rhythm with a normal S1 and S2.  There are no murmurs, rubs or bruits.  Abdomen: Soft with good bowel sounds.  There is no palpable abdominal mass. There is no fluid wave.  There is no palpable hepatosplenomegaly.  Back: No tenderness over the spine, ribs, or hips.  Extremities:  No clubbing, cyanosis or edema.  Neurologic:  No focal neurological deficits.  LABORATORY STUDIES:  White cell count is 8.1, hemoglobin 16.5, hematocrit 48.4, platelet count 189.  MCV is 92.  IMPRESSION:  Mr. Brandenburg is a 71 year old gentleman with polycythemia vera.  I do want to keep his hemoglobin below 45 or 46.  Will go ahead and phlebotomize him today.  We will then plan to get him back in 3 more months.  We basically will set him up for phlebotomies every 3 months when we see him.  I feel the aspirin is helping him out quite a bit.    ______________________________ Josph Macho, M.D. PRE/MEDQ  D:  04/07/2011  T:  04/08/2011  Job:   [id number]

## 2011-04-10 ENCOUNTER — Other Ambulatory Visit: Payer: Self-pay | Admitting: Internal Medicine

## 2011-04-10 MED ORDER — OLMESARTAN MEDOXOMIL 20 MG PO TABS: 20.0000 mg | ORAL_TABLET | Freq: Every day | ORAL | Status: DC

## 2011-04-10 NOTE — Telephone Encounter (Signed)
rx sent in electronically 

## 2011-04-10 NOTE — Telephone Encounter (Signed)
Pt needs new rx benicar 20 mg #90 with 3 refills call [facility]

## 2011-04-17 ENCOUNTER — Telehealth: Payer: Self-pay | Admitting: Internal Medicine

## 2011-04-17 NOTE — Telephone Encounter (Signed)
Pt requesting refill on azelastine (ASTELIN) 137 MCG/SPRAY nasal spray  [facility]

## 2011-04-18 ENCOUNTER — Telehealth: Payer: Self-pay | Admitting: Family Medicine

## 2011-04-18 MED ORDER — TRIAMCINOLONE ACETONIDE(NASAL) 55 MCG/ACT NA INHA: 2.0000 | Freq: Every day | NASAL | Status: DC

## 2011-04-18 MED ORDER — AZELASTINE HCL 0.1 % NA SOLN: 2.0000 | Freq: Two times a day (BID) | NASAL | Status: DC

## 2011-04-18 NOTE — Telephone Encounter (Signed)
rx sent in electronically 

## 2011-04-18 NOTE — Telephone Encounter (Signed)
Pt calling in for a new Rx for Nasacort AQ aer nasal spray. Has not had one since 03/2009, and has finally run out. Should he continue to use this or does Dr. Artist Pais want him to try something different? Please check and call pt. Rx needs to be electronically submitted to [facility]. Thanks!

## 2011-04-18 NOTE — Telephone Encounter (Signed)
rx sent in electronically, pt aware 

## 2011-04-18 NOTE — Telephone Encounter (Signed)
plz call in nasonex 2 sprays each nostril qd.  3 month supply w 3 rf.

## 2011-06-14 ENCOUNTER — Other Ambulatory Visit: Payer: Self-pay | Admitting: *Deleted

## 2011-06-14 DIAGNOSIS — I10 Essential (primary) hypertension: Secondary | ICD-10-CM

## 2011-06-14 MED ORDER — AMLODIPINE BESYLATE 5 MG PO TABS: 5.0000 mg | ORAL_TABLET | Freq: Every day | ORAL | Status: DC

## 2011-06-14 MED ORDER — OLMESARTAN MEDOXOMIL 20 MG PO TABS: 20.0000 mg | ORAL_TABLET | Freq: Every day | ORAL | Status: DC

## 2011-06-14 MED ORDER — ALLOPURINOL 100 MG PO TABS: 100.0000 mg | ORAL_TABLET | Freq: Every day | ORAL | Status: DC

## 2011-06-14 MED ORDER — SIMVASTATIN 20 MG PO TABS: 20.0000 mg | ORAL_TABLET | Freq: Every day | ORAL | Status: DC

## 2011-06-14 MED ORDER — AZELASTINE HCL 0.1 % NA SOLN: 2.0000 | Freq: Two times a day (BID) | NASAL | Status: DC

## 2011-06-29 DIAGNOSIS — M79609 Pain in unspecified limb: Secondary | ICD-10-CM | POA: Diagnosis not present

## 2011-06-29 DIAGNOSIS — B351 Tinea unguium: Secondary | ICD-10-CM | POA: Diagnosis not present

## 2011-06-30 ENCOUNTER — Ambulatory Visit (INDEPENDENT_AMBULATORY_CARE_PROVIDER_SITE_OTHER): Payer: Medicare Other | Admitting: Internal Medicine

## 2011-06-30 ENCOUNTER — Encounter: Payer: Self-pay | Admitting: Internal Medicine

## 2011-06-30 VITALS — BP 124/82 | Temp 98.2°F | Wt 221.0 lb

## 2011-06-30 DIAGNOSIS — L259 Unspecified contact dermatitis, unspecified cause: Secondary | ICD-10-CM

## 2011-06-30 DIAGNOSIS — L239 Allergic contact dermatitis, unspecified cause: Secondary | ICD-10-CM | POA: Insufficient documentation

## 2011-06-30 NOTE — Assessment & Plan Note (Signed)
Patient has signs and symptoms consistent with contact dermatitis. Continue to use topical steroids for 1 to 2 weeks.  Patient advised to call office if symptoms persist or worsen.

## 2011-06-30 NOTE — Progress Notes (Signed)
Subjective:    Patient ID: Alexander [name], male    DOB: Apr 24, 1940, 71 y.o.   MRN: [id number]  Rash This is a new problem. The current episode started yesterday. The problem has been gradually improving since onset. The affected locations include the right arm. The rash is characterized by itchiness and redness. He was exposed to plant contact. Pertinent negatives include no facial edema, fever or shortness of breath. Past treatments include topical steroids. The treatment provided mild relief.   Pt worried rash might be cellulitis.   Review of Systems  Constitutional: Negative for fever.  Respiratory: Negative for shortness of breath.   Skin: Positive for rash.   Past Medical History  Diagnosis Date  . GERD (gastroesophageal reflux disease)   . Hypertension   . Microalbuminuria   . Lumbar back pain   . Hematuria, microscopic   . Hyperglycemia   . Diverticulosis of colon   . Hyperlipidemia   . Gout   . Colon polyp   . GI bleed     secondary to Aspirin  . Polycythemia rubra vera     History   Social History  . Marital Status: Married    Spouse Name: N/A    Number of Children: 3  . Years of Education: N/A   Occupational History  . retired Art gallery manager    Social History Main Topics  . Smoking status: Never Smoker   . Smokeless tobacco: Never Used  . Alcohol Use: No  . Drug Use: No  . Sexually Active: Yes -- Male partner(s)   Other Topics Concern  . Not on file   Social History Narrative   Daughter, Alexander [name] (pediatrician--lives in [place], [place])    Past Surgical History  Procedure Date  . Cataract extraction   . Hernia repair   . Tonsillectomy     Family History  Problem Relation Age of Onset  . Cancer Mother     died at 70  . Heart disease Father 104    MI  . Diabetes      No Known Allergies  Current Outpatient Prescriptions on File Prior to Visit  Medication Sig Dispense Refill  . allopurinol (ZYLOPRIM) 100 MG tablet Take 1 tablet (100 mg  total) by mouth daily.  90 tablet  1  . amLODipine (NORVASC) 5 MG tablet Take 1 tablet (5 mg total) by mouth daily.  90 tablet  3  . aspirin 81 MG tablet Take 81 mg by mouth daily.        [name] azelastine (ASTELIN) 137 MCG/[name] nasal [name] Place 2 sprays into the nose 2 (two) times daily. For nasal allergies  30 mL  3  . azelastine (OPTIVAR) 0.05 % ophthalmic solution Place 1 drop into both eyes 2 (two) times daily.       . Coenzyme Q10 (COQ-10) 75 MG CAPS Take 1 capsule by mouth daily.        . Lactobacillus (ACIDOPHILUS PO) Take by mouth 2 (two) times daily.       . Multiple Vitamin (MULTIVITAMIN) capsule Take 1 capsule by mouth daily.        [name] olmesartan (BENICAR) 20 MG tablet Take 1 tablet (20 mg total) by mouth daily.  90 tablet  1  . simvastatin (ZOCOR) 20 MG tablet Take 1 tablet (20 mg total) by mouth daily.  90 tablet  1  . triamcinolone (NASACORT AQ) 55 MCG/ACT nasal inhaler Place 2 sprays into the nose daily.  3 Inhaler  1  . triamcinolone  cream (KENALOG) 0.1 % Apply topically 2 (two) times daily.  30 g  2    BP 124/82  Temp 98.2 F (36.8 C) (Oral)  Wt 221 lb (100.245 kg)        Objective:   Physical Exam  Constitutional: He appears well-developed and well-nourished.  Cardiovascular: Normal rate, regular rhythm and normal heart sounds.   Pulmonary/Chest: Effort normal and breath sounds normal. He has no wheezes.  Skin:       Mild redness over ventral aspect of right forearm          Assessment & Plan:

## 2011-07-05 DIAGNOSIS — L02619 Cutaneous abscess of unspecified foot: Secondary | ICD-10-CM | POA: Diagnosis not present

## 2011-07-05 DIAGNOSIS — L03039 Cellulitis of unspecified toe: Secondary | ICD-10-CM | POA: Diagnosis not present

## 2011-07-07 ENCOUNTER — Other Ambulatory Visit (HOSPITAL_BASED_OUTPATIENT_CLINIC_OR_DEPARTMENT_OTHER): Payer: Medicare Other | Admitting: Lab

## 2011-07-07 ENCOUNTER — Ambulatory Visit (HOSPITAL_BASED_OUTPATIENT_CLINIC_OR_DEPARTMENT_OTHER): Payer: Medicare Other | Admitting: Hematology & Oncology

## 2011-07-07 VITALS — BP 132/86 | HR 60 | Temp 97.4°F | Ht 70.0 in | Wt 222.0 lb

## 2011-07-07 DIAGNOSIS — D45 Polycythemia vera: Secondary | ICD-10-CM | POA: Diagnosis not present

## 2011-07-07 LAB — CBC WITH DIFFERENTIAL (CANCER CENTER ONLY)
BASO%: 0.3 % (ref 0.0–2.0)
HCT: 48 % (ref 38.7–49.9)
LYMPH#: 1.9 10*3/uL (ref 0.9–3.3)
MONO#: 1 10*3/uL — ABNORMAL HIGH (ref 0.1–0.9)
NEUT#: 4.5 10*3/uL (ref 1.5–6.5)
NEUT%: 57.6 % (ref 40.0–80.0)
RDW: 13.3 % (ref 11.1–15.7)
WBC: 7.8 10*3/uL (ref 4.0–10.0)

## 2011-07-07 LAB — FERRITIN: Ferritin: 40 ng/mL (ref 22–322)

## 2011-07-07 NOTE — Progress Notes (Signed)
This office note has been dictated.

## 2011-07-08 NOTE — Progress Notes (Signed)
CC:   Barbette Hair. Artist Pais, DO  DIAGNOSIS:  Polycythemia vera, JAK2 negative.  CURRENT THERAPY: 1. Phlebotomy to maintain hematocrit below 45. 2. Aspirin 81 mg p.o. daily.  INTERIM HISTORY:  Mr. Gribble comes in his wife.  They are doing okay. They have been hosting their family as they come across from [place]. They just had couple of great grandkids come over.  He is trying to swim on a regular basis. Unfortunately, the weather has been a little difficult for him to swim.  He has had no chest pain.  He has had no cough.  He has had no nausea or vomiting.  He has had no rashes.  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  PHYSICAL EXAMINATION:  This is an elderly but well-nourished white gentleman in no obvious distress.  Vital signs:  Temperature of 97.4, pulse 68, respiratory rate 20, blood pressure 132/86.  Weight is 222. Head and neck:  Normocephalic, atraumatic skull.  There are no ocular or oral lesions.  There are no palpable cervical or supraclavicular lymph nodes.  Lungs:  Clear bilaterally.  Cardiac:  Regular rate and rhythm with a normal S1 and S2.  There are no murmurs, rubs or bruits. Abdomen: Soft with good bowel sounds.  There is no palpable abdominal mass.  There is no fluid wave.  There is no palpable hepatosplenomegaly. Extremities:  No clubbing, cyanosis or edema.  Neurologic:  No focal neurological deficits.  Skin:  Maybe some slight plethora noted area to his skin and maybe a little bit of a ruddy complexion.  LABORATORY STUDIES:  White cell count 7.8, hemoglobin 16.4, hematocrit 40, platelet count is 178.  IMPRESSION:  Alexander [name] is a 71 year old gentleman with polycythemia. So far, he has not had any complications.  We will have to phlebotomize him.  I do want to be aggressive with him, given that he does have a active lifestyle.  We will go ahead and get him set up next week or so for a phlebotomy. Then I will plan to see him back in  September.  Typically, we get him back every 3 months for an evaluation.    ______________________________ Josph Macho, M.D. PRE/MEDQ  D:  07/07/2011  T:  07/08/2011  Job:  [id number]

## 2011-07-10 ENCOUNTER — Telehealth: Payer: Self-pay | Admitting: Hematology & Oncology

## 2011-07-10 NOTE — Telephone Encounter (Signed)
Mailed October schedule 

## 2011-07-14 ENCOUNTER — Ambulatory Visit (HOSPITAL_BASED_OUTPATIENT_CLINIC_OR_DEPARTMENT_OTHER): Payer: PRIVATE HEALTH INSURANCE

## 2011-07-14 DIAGNOSIS — D45 Polycythemia vera: Secondary | ICD-10-CM | POA: Diagnosis not present

## 2011-07-14 NOTE — Progress Notes (Signed)
Alexander [name] presents today for phlebotomy per MD orders. Phlebotomy procedure started at 1350 and ended at 1400. 500 grams removed. Patient observed for 30 minutes after procedure without any incident. Patient tolerated procedure well. IV needle removed intact.

## 2011-07-14 NOTE — Patient Instructions (Signed)

## 2011-07-18 DIAGNOSIS — L03039 Cellulitis of unspecified toe: Secondary | ICD-10-CM | POA: Diagnosis not present

## 2011-07-25 ENCOUNTER — Other Ambulatory Visit (INDEPENDENT_AMBULATORY_CARE_PROVIDER_SITE_OTHER): Payer: Medicare Other

## 2011-07-25 DIAGNOSIS — Z0000 Encounter for general adult medical examination without abnormal findings: Secondary | ICD-10-CM | POA: Diagnosis not present

## 2011-07-25 DIAGNOSIS — E785 Hyperlipidemia, unspecified: Secondary | ICD-10-CM

## 2011-07-25 LAB — CBC WITH DIFFERENTIAL/PLATELET
Basophils Relative: 0.8 % (ref 0.0–3.0)
Eosinophils Absolute: 0.6 10*3/uL (ref 0.0–0.7)
Eosinophils Relative: 7 % — ABNORMAL HIGH (ref 0.0–5.0)
HCT: 45.2 % (ref 39.0–52.0)
Hemoglobin: 15.1 g/dL (ref 13.0–17.0)
Lymphs Abs: 2.1 10*3/uL (ref 0.7–4.0)
MCHC: 33.5 g/dL (ref 30.0–36.0)
MCV: 94.7 fl (ref 78.0–100.0)
Monocytes Absolute: 1 10*3/uL (ref 0.1–1.0)
Neutro Abs: 4.6 10*3/uL (ref 1.4–7.7)
RBC: 4.78 Mil/uL (ref 4.22–5.81)
WBC: 8.4 10*3/uL (ref 4.5–10.5)

## 2011-07-25 LAB — POCT URINALYSIS DIPSTICK
Glucose, UA: NEGATIVE
Nitrite, UA: NEGATIVE
Protein, UA: NEGATIVE
Urobilinogen, UA: 0.2
pH, UA: 5

## 2011-07-25 LAB — HEPATIC FUNCTION PANEL
ALT: 15 U/L (ref 0–53)
Bilirubin, Direct: 0.1 mg/dL (ref 0.0–0.3)
Total Bilirubin: 0.7 mg/dL (ref 0.3–1.2)

## 2011-07-25 LAB — BASIC METABOLIC PANEL
CO2: 29 mEq/L (ref 19–32)
Chloride: 103 mEq/L (ref 96–112)
Creatinine, Ser: 1.2 mg/dL (ref 0.4–1.5)
Potassium: 4.5 mEq/L (ref 3.5–5.1)
Sodium: 138 mEq/L (ref 135–145)

## 2011-07-25 LAB — LIPID PANEL
LDL Cholesterol: 85 mg/dL (ref 0–99)
Total CHOL/HDL Ratio: 4
Triglycerides: 156 mg/dL — ABNORMAL HIGH (ref 0.0–149.0)
VLDL: 31.2 mg/dL (ref 0.0–40.0)

## 2011-07-31 ENCOUNTER — Other Ambulatory Visit: Payer: Medicare Other

## 2011-08-01 ENCOUNTER — Other Ambulatory Visit: Payer: Medicare Other

## 2011-08-07 ENCOUNTER — Ambulatory Visit: Payer: Medicare Other | Admitting: Internal Medicine

## 2011-08-15 ENCOUNTER — Encounter: Payer: Self-pay | Admitting: Internal Medicine

## 2011-08-15 ENCOUNTER — Ambulatory Visit (INDEPENDENT_AMBULATORY_CARE_PROVIDER_SITE_OTHER): Payer: Medicare Other | Admitting: Internal Medicine

## 2011-08-15 VITALS — BP 124/80 | HR 72 | Temp 98.2°F | Resp 16 | Ht 70.0 in | Wt 220.0 lb

## 2011-08-15 DIAGNOSIS — E785 Hyperlipidemia, unspecified: Secondary | ICD-10-CM | POA: Diagnosis not present

## 2011-08-15 DIAGNOSIS — R7303 Prediabetes: Secondary | ICD-10-CM

## 2011-08-15 DIAGNOSIS — I10 Essential (primary) hypertension: Secondary | ICD-10-CM | POA: Diagnosis not present

## 2011-08-15 DIAGNOSIS — R7309 Other abnormal glucose: Secondary | ICD-10-CM | POA: Diagnosis not present

## 2011-08-15 MED ORDER — METFORMIN HCL 500 MG PO TABS: 500.0000 mg | ORAL_TABLET | Freq: Two times a day (BID) | ORAL | Status: DC

## 2011-08-15 MED ORDER — AZELASTINE HCL 0.1 % NA SOLN: 2.0000 | Freq: Two times a day (BID) | NASAL | Status: DC

## 2011-08-15 MED ORDER — GLUCOSE BLOOD VI STRP: 3.0000 | ORAL_STRIP | Status: DC

## 2011-08-15 NOTE — Patient Instructions (Addendum)
Please complete the following lab tests before your next follow up appointment: BMET, A1c - 790.29 

## 2011-08-15 NOTE — Assessment & Plan Note (Signed)
Stable. Continue simvastatin.  Lab Results  Component Value Date   CHOL 152 07/25/2011   HDL 35.80* 07/25/2011   LDLCALC 85 07/25/2011   TRIG 156.0* 07/25/2011   CHOLHDL 4 07/25/2011   Lab Results  Component Value Date   ALT 15 07/25/2011   AST 17 07/25/2011   ALKPHOS 75 07/25/2011   BILITOT 0.7 07/25/2011

## 2011-08-15 NOTE — Assessment & Plan Note (Signed)
Patient's fasting blood sugar 129. A1c may be somewhat unreliable due to the fact the patient gets periodic phlebotomy for polycythemia vera. Restart metformin 500 mg twice daily.

## 2011-08-15 NOTE — Progress Notes (Signed)
Subjective:    Patient ID: Alexander [name], male    DOB: 11-21-1940, 71 y.o.   MRN: [id number]  HPI  71 year old white male with history of polycythemia vera, hypertension and prediabetes for followup. Overall patient has been doing well. He exercises on a regular basis. He has occasional dizziness after mowing his lawn but denies chest pain or unusual shortness of breath.  His blood work was reviewed. He still has persistent mild hypertriglyceridemia and low HDL. He is tolerating simvastatin 20 mg without difficulty.  Patient notes he had phlebotomy prior to his recent blood work.  Review of Systems  No chest pain or shortness of breath  Past Medical History  Diagnosis Date  . GERD (gastroesophageal reflux disease)   . Hypertension   . Microalbuminuria   . Lumbar back pain   . Hematuria, microscopic   . Hyperglycemia   . Diverticulosis of colon   . Hyperlipidemia   . Gout   . Colon polyp   . GI bleed     secondary to Aspirin  . Polycythemia rubra vera     History   Social History  . Marital Status: Married    Spouse Name: N/A    Number of Children: 3  . Years of Education: N/A   Occupational History  . retired Art gallery manager    Social History Main Topics  . Smoking status: Never Smoker   . Smokeless tobacco: Never Used  . Alcohol Use: No  . Drug Use: No  . Sexually Active: Yes -- Male partner(s)   Other Topics Concern  . Not on file   Social History Narrative   Daughter, Alexander [name] (pediatrician--lives in [place], [place])    Past Surgical History  Procedure Date  . Cataract extraction   . Hernia repair   . Tonsillectomy     Family History  Problem Relation Age of Onset  . Cancer Mother     died at 21  . Heart disease Father 58    MI  . Diabetes      No Known Allergies  Current Outpatient Prescriptions on File Prior to Visit  Medication Sig Dispense Refill  . allopurinol (ZYLOPRIM) 100 MG tablet Take 1 tablet (100 mg total) by mouth daily.  90  tablet  1  . amLODipine (NORVASC) 5 MG tablet Take 1 tablet (5 mg total) by mouth daily.  90 tablet  3  . aspirin 81 MG tablet Take 81 mg by mouth daily.        [name] azelastine (OPTIVAR) 0.05 % ophthalmic solution Place 1 drop into both eyes 2 (two) times daily.       . Coenzyme Q10 (COQ-10) 75 MG CAPS Take 1 capsule by mouth daily.        . Multiple Vitamin (MULTIVITAMIN) capsule Take 1 capsule by mouth daily.        [name] olmesartan (BENICAR) 20 MG tablet Take 1 tablet (20 mg total) by mouth daily.  90 tablet  1  . simvastatin (ZOCOR) 20 MG tablet Take 1 tablet (20 mg total) by mouth daily.  90 tablet  1  . triamcinolone (NASACORT AQ) 55 MCG/ACT nasal inhaler Place 2 sprays into the nose daily.  3 Inhaler  1  . triamcinolone cream (KENALOG) 0.1 % Apply topically 2 (two) times daily.  30 g  2  . DISCONTD: azelastine (ASTELIN) 137 MCG/[name] nasal [name] Place 2 sprays into the nose 2 (two) times daily. For nasal allergies  30 mL  3  . metFORMIN (  GLUCOPHAGE) 500 MG tablet Take 1 tablet (500 mg total) by mouth 2 (two) times daily with a meal.  180 tablet  3    BP 124/80  Pulse 72  Temp 98.2 F (36.8 C)  Resp 16  Ht 5\' 10"  (1.778 m)  Wt 220 lb (99.791 kg)  BMI 31.57 kg/m2       Objective:   Physical Exam  Constitutional: He is oriented to person, place, and time. He appears well-developed and well-nourished.  HENT:  Head: Normocephalic and atraumatic.  Neck:       No carotid bruit  Cardiovascular: Normal rate, regular rhythm and normal heart sounds.   No murmur heard. Pulmonary/Chest: Effort normal. He has no wheezes.  Musculoskeletal: He exhibits no edema.  Neurological: He is alert and oriented to person, place, and time.  Psychiatric: He has a normal mood and affect. His behavior is normal.          Assessment & Plan:

## 2011-08-15 NOTE — Assessment & Plan Note (Signed)
Well controlled.  No change in medication.  BP: 124/80 mmHg  Lab Results  Component Value Date   CREATININE 1.2 07/25/2011

## 2011-08-17 ENCOUNTER — Telehealth: Payer: Self-pay | Admitting: Internal Medicine

## 2011-08-17 MED ORDER — LANCETS MISC: 1.0000 | Status: DC

## 2011-08-17 MED ORDER — GLUCOSE BLOOD VI STRP: ORAL_STRIP | Status: DC

## 2011-08-17 NOTE — Telephone Encounter (Signed)
rx sent in electronically 

## 2011-08-17 NOTE — Telephone Encounter (Signed)
Pt called and said that the pharmacy told pt that he should get the Freestyle Lite test strips and lancets through [facility]. Pt needs this asap.

## 2011-08-30 ENCOUNTER — Telehealth: Payer: Self-pay | Admitting: Internal Medicine

## 2011-08-30 NOTE — Telephone Encounter (Signed)
Pt called and said that he needs a prescription for diabetic testing supplies. [facility]in [facility] on [address] [phone] rcvd the dx code, but needs to be on the Complete Script. Pt needs glucose blood (FREESTYLE LITE) test strip and Freestyle Lite Lancets Qty of each, written direction for use and a dx code. This all needs to be put on one script. Pt has been waiting for this for 2 and a half wks. Pls call pt when this has been taken care of. Lv detailed vm if pt not avail.

## 2011-08-31 NOTE — Telephone Encounter (Signed)
rx faxed, pt aware. 

## 2011-09-13 DIAGNOSIS — D1801 Hemangioma of skin and subcutaneous tissue: Secondary | ICD-10-CM | POA: Diagnosis not present

## 2011-09-13 DIAGNOSIS — D235 Other benign neoplasm of skin of trunk: Secondary | ICD-10-CM | POA: Diagnosis not present

## 2011-09-14 ENCOUNTER — Telehealth: Payer: Self-pay | Admitting: Internal Medicine

## 2011-09-14 ENCOUNTER — Ambulatory Visit (INDEPENDENT_AMBULATORY_CARE_PROVIDER_SITE_OTHER): Payer: Medicare Other

## 2011-09-14 DIAGNOSIS — Z23 Encounter for immunization: Secondary | ICD-10-CM | POA: Diagnosis not present

## 2011-09-14 NOTE — Telephone Encounter (Signed)
Patient would like to know did you get his email.  [email or web address]  Please call patient in regards to the e-mail. Thanks.

## 2011-09-14 NOTE — Telephone Encounter (Signed)
No, I did not get his email.

## 2011-09-18 NOTE — Telephone Encounter (Signed)
Pt is schd to come in for ov on 09/20/11 and would like to know if he needs to do any labs prior to ov. Pt says that he was prescribed metformin and has been feeling strange ever since he's been taking med.

## 2011-09-19 DIAGNOSIS — B351 Tinea unguium: Secondary | ICD-10-CM | POA: Diagnosis not present

## 2011-09-19 DIAGNOSIS — M79609 Pain in unspecified limb: Secondary | ICD-10-CM | POA: Diagnosis not present

## 2011-09-19 NOTE — Telephone Encounter (Signed)
Yes.  BMET and A1c - 790.29.  What does he mean by feeling strange?

## 2011-09-20 ENCOUNTER — Ambulatory Visit (INDEPENDENT_AMBULATORY_CARE_PROVIDER_SITE_OTHER): Payer: Medicare Other | Admitting: Internal Medicine

## 2011-09-20 ENCOUNTER — Encounter: Payer: Self-pay | Admitting: Internal Medicine

## 2011-09-20 VITALS — BP 124/76 | HR 80 | Temp 98.1°F | Wt 217.0 lb

## 2011-09-20 DIAGNOSIS — R7309 Other abnormal glucose: Secondary | ICD-10-CM

## 2011-09-20 DIAGNOSIS — R7303 Prediabetes: Secondary | ICD-10-CM

## 2011-09-20 NOTE — Patient Instructions (Addendum)
Please complete the following lab tests before your next follow up appointment: BMET, A1c - 790.29 

## 2011-09-20 NOTE — Telephone Encounter (Signed)
Pt is coming today

## 2011-09-21 NOTE — Assessment & Plan Note (Signed)
Patient experiencing exacerbation.  Resume intranasal steroid sprays.

## 2011-09-21 NOTE — Progress Notes (Signed)
Subjective:    Patient ID: Alexander [name], male    DOB: 03/10/1940, 71 y.o.   MRN: [id number]  HPI  71 y/o white male with hx of polycythemia vera, hypertension, and abnormal glucose for follow up.   Patient reports several days of feeling tired and mild nausea after restarting metformin.  He is starting to feel better.   He also notes sinus and nasal congestion.  He has not been using his intranasal saline [name].  Review of Systems No fever or chills  Past Medical History  Diagnosis Date  . GERD (gastroesophageal reflux disease)   . Hypertension   . Microalbuminuria   . Lumbar back pain   . Hematuria, microscopic   . Hyperglycemia   . Diverticulosis of colon   . Hyperlipidemia   . Gout   . Colon polyp   . GI bleed     secondary to Aspirin  . Polycythemia rubra vera     History   Social History  . Marital Status: Married    Spouse Name: N/A    Number of Children: 3  . Years of Education: N/A   Occupational History  . retired Art gallery manager    Social History Main Topics  . Smoking status: Never Smoker   . Smokeless tobacco: Never Used  . Alcohol Use: No  . Drug Use: No  . Sexually Active: Yes -- Male partner(s)   Other Topics Concern  . Not on file   Social History Narrative   Daughter, Alexander [name] (pediatrician--lives in [place], [place])    Past Surgical History  Procedure Date  . Cataract extraction   . Hernia repair   . Tonsillectomy     Family History  Problem Relation Age of Onset  . Cancer Mother     died at 31  . Heart disease Father 91    MI  . Diabetes      No Known Allergies  Current Outpatient Prescriptions on File Prior to Visit  Medication Sig Dispense Refill  . allopurinol (ZYLOPRIM) 100 MG tablet Take 1 tablet (100 mg total) by mouth daily.  90 tablet  1  . amLODipine (NORVASC) 5 MG tablet Take 1 tablet (5 mg total) by mouth daily.  90 tablet  3  . aspirin 81 MG tablet Take 81 mg by mouth daily.        [name] azelastine (ASTELIN) 137  MCG/[name] nasal [name] Place 2 sprays into the nose 2 (two) times daily. For nasal allergies  30 mL  11  . azelastine (OPTIVAR) 0.05 % ophthalmic solution Place 1 drop into both eyes 2 (two) times daily.       . Coenzyme Q10 (COQ-10) 75 MG CAPS Take 1 capsule by mouth daily.        [name] glucose blood (FREESTYLE LITE) test strip Test three times weekly  100 each  3  . Lancets MISC 1 each by Does not apply route 3 (three) times a week.  100 each  3  . metFORMIN (GLUCOPHAGE) 500 MG tablet Take 1 tablet (500 mg total) by mouth 2 (two) times daily with a meal.  180 tablet  3  . Multiple Vitamin (MULTIVITAMIN) capsule Take 1 capsule by mouth daily.        [name] olmesartan (BENICAR) 20 MG tablet Take 1 tablet (20 mg total) by mouth daily.  90 tablet  1  . simvastatin (ZOCOR) 20 MG tablet Take 1 tablet (20 mg total) by mouth daily.  90 tablet  1  .  triamcinolone (NASACORT AQ) 55 MCG/ACT nasal inhaler Place 2 sprays into the nose daily.  3 Inhaler  1  . triamcinolone cream (KENALOG) 0.1 % Apply topically 2 (two) times daily.  30 g  2    BP 124/76  Pulse 80  Temp 98.1 F (36.7 C) (Oral)  Wt 217 lb (98.431 kg)       Objective:   Physical Exam  Constitutional: He is oriented to person, place, and time. He appears well-developed and well-nourished.  HENT:  Head: Normocephalic and atraumatic.  Right Ear: External ear normal.  Left Ear: External ear normal.  Mouth/Throat: Oropharynx is clear and moist.       Boggy nasal turbinates.  Cardiovascular: Normal rate, regular rhythm and normal heart sounds.   Pulmonary/Chest: Effort normal and breath sounds normal. He has no wheezes.  Neurological: He is alert and oriented to person, place, and time.          Assessment & Plan:

## 2011-09-21 NOTE — Assessment & Plan Note (Signed)
Patient having mild intolerance to metformin. He is beginning to tolerate better.  Maintain same dose.

## 2011-09-22 ENCOUNTER — Telehealth: Payer: Self-pay | Admitting: Hematology & Oncology

## 2011-09-22 NOTE — Telephone Encounter (Signed)
Patient called and cx 10/09/11 appt and resch for 10/18/11

## 2011-10-09 ENCOUNTER — Other Ambulatory Visit: Payer: Medicare Other | Admitting: Lab

## 2011-10-09 ENCOUNTER — Ambulatory Visit: Payer: Medicare Other | Admitting: Hematology & Oncology

## 2011-10-13 ENCOUNTER — Ambulatory Visit (INDEPENDENT_AMBULATORY_CARE_PROVIDER_SITE_OTHER): Payer: Medicare Other | Admitting: Internal Medicine

## 2011-10-13 ENCOUNTER — Encounter: Payer: Self-pay | Admitting: Internal Medicine

## 2011-10-13 VITALS — BP 124/80 | HR 60 | Temp 98.3°F | Wt 214.0 lb

## 2011-10-13 DIAGNOSIS — R7303 Prediabetes: Secondary | ICD-10-CM

## 2011-10-13 DIAGNOSIS — R7309 Other abnormal glucose: Secondary | ICD-10-CM

## 2011-10-13 MED ORDER — METFORMIN HCL ER 500 MG PO TB24: ORAL_TABLET | ORAL | Status: DC

## 2011-10-13 NOTE — Patient Instructions (Addendum)
Keep your next appointment in January. Call our office if have difficulty tolerating Metformin XR

## 2011-10-13 NOTE — Progress Notes (Signed)
Subjective:    Patient ID: Alexander [name], male    DOB: 1940/04/17, 71 y.o.   MRN: [id number]  HPI  71 year old white male with prediabetes for follow up. Patient reports persistent issues with metformin. It makes him feel dizzy, lightheaded and woozy. It also causes abdominal discomfort and nausea. He stopped medication x 1 week and he is feeling much better.  Review of Systems Home blood sugar readings are stable / normal.     Past Medical History  Diagnosis Date  . GERD (gastroesophageal reflux disease)   . Hypertension   . Microalbuminuria   . Lumbar back pain   . Hematuria, microscopic   . Hyperglycemia   . Diverticulosis of colon   . Hyperlipidemia   . Gout   . Colon polyp   . GI bleed     secondary to Aspirin  . Polycythemia rubra vera     History   Social History  . Marital Status: Married    Spouse Name: N/A    Number of Children: 3  . Years of Education: N/A   Occupational History  . retired Art gallery manager    Social History Main Topics  . Smoking status: Never Smoker   . Smokeless tobacco: Never Used  . Alcohol Use: No  . Drug Use: No  . Sexually Active: Yes -- Male partner(s)   Other Topics Concern  . Not on file   Social History Narrative   Daughter, Elease Hashimoto (pediatrician--lives in [place], [place])    Past Surgical History  Procedure Date  . Cataract extraction   . Hernia repair   . Tonsillectomy     Family History  Problem Relation Age of Onset  . Cancer Mother     died at 79  . Heart disease Father 49    MI  . Diabetes      No Known Allergies  Current Outpatient Prescriptions on File Prior to Visit  Medication Sig Dispense Refill  . allopurinol (ZYLOPRIM) 100 MG tablet Take 1 tablet (100 mg total) by mouth daily.  90 tablet  1  . amLODipine (NORVASC) 5 MG tablet Take 1 tablet (5 mg total) by mouth daily.  90 tablet  3  . aspirin 81 MG tablet Take 81 mg by mouth daily.        [name] azelastine (ASTELIN) 137 MCG/[name] nasal [name] Place  2 sprays into the nose 2 (two) times daily. For nasal allergies  30 mL  11  . azelastine (OPTIVAR) 0.05 % ophthalmic solution Place 1 drop into both eyes 2 (two) times daily.       . Coenzyme Q10 (COQ-10) 75 MG CAPS Take 1 capsule by mouth daily.        [name] glucose blood (FREESTYLE LITE) test strip Test three times weekly  100 each  3  . Lancets MISC 1 each by Does not apply route 3 (three) times a week.  100 each  3  . Multiple Vitamin (MULTIVITAMIN) capsule Take 1 capsule by mouth daily.        [name] olmesartan (BENICAR) 20 MG tablet Take 1 tablet (20 mg total) by mouth daily.  90 tablet  1  . simvastatin (ZOCOR) 20 MG tablet Take 1 tablet (20 mg total) by mouth daily.  90 tablet  1  . triamcinolone (NASACORT AQ) 55 MCG/ACT nasal inhaler Place 2 sprays into the nose daily.  3 Inhaler  1  . triamcinolone cream (KENALOG) 0.1 % Apply topically 2 (two) times daily.  30 g  2    BP 124/80  Pulse 60  Temp 98.3 F (36.8 C) (Oral)  Wt 214 lb (97.07 kg)    Objective:   Physical Exam  Constitutional: He is oriented to person, place, and time. He appears well-developed and well-nourished.  Cardiovascular: Normal rate, regular rhythm and normal heart sounds.   Pulmonary/Chest: Effort normal and breath sounds normal.  Abdominal: Soft. Bowel sounds are normal. He exhibits no mass.  Musculoskeletal: He exhibits no edema.  Neurological: He is alert and oriented to person, place, and time. No cranial nerve deficit.  Skin: Skin is warm and dry.          Assessment & Plan:

## 2011-10-13 NOTE — Assessment & Plan Note (Signed)
Patient having difficulty tolerating regular metformin. Switch to metformin XR. If persistent issues, consider switch to [facility].

## 2011-10-18 ENCOUNTER — Ambulatory Visit (HOSPITAL_BASED_OUTPATIENT_CLINIC_OR_DEPARTMENT_OTHER): Payer: Medicare Other | Admitting: Hematology & Oncology

## 2011-10-18 ENCOUNTER — Other Ambulatory Visit (HOSPITAL_BASED_OUTPATIENT_CLINIC_OR_DEPARTMENT_OTHER): Payer: Medicare Other | Admitting: Lab

## 2011-10-18 VITALS — BP 120/76 | HR 50 | Temp 97.9°F | Resp 18 | Ht 70.0 in | Wt 214.0 lb

## 2011-10-18 DIAGNOSIS — D45 Polycythemia vera: Secondary | ICD-10-CM | POA: Diagnosis not present

## 2011-10-18 LAB — IRON AND TIBC
%SAT: 25 % (ref 20–55)
TIBC: 327 ug/dL (ref 215–435)
UIBC: 246 ug/dL (ref 125–400)

## 2011-10-18 LAB — CBC WITH DIFFERENTIAL (CANCER CENTER ONLY)
BASO#: 0 10*3/uL (ref 0.0–0.2)
BASO%: 0.4 % (ref 0.0–2.0)
Eosinophils Absolute: 0.4 10*3/uL (ref 0.0–0.5)
HCT: 49.1 % (ref 38.7–49.9)
HGB: 16.8 g/dL (ref 13.0–17.1)
LYMPH#: 2.5 10*3/uL (ref 0.9–3.3)
MCV: 92 fL (ref 82–98)
MONO#: 1.7 10*3/uL — ABNORMAL HIGH (ref 0.1–0.9)
NEUT%: 55.2 % (ref 40.0–80.0)
RBC: 5.35 10*6/uL (ref 4.20–5.70)
WBC: 10.2 10*3/uL — ABNORMAL HIGH (ref 4.0–10.0)

## 2011-10-18 NOTE — Progress Notes (Signed)
This office note has been dictated.

## 2011-10-19 NOTE — Progress Notes (Signed)
CC:   Barbette Hair. Artist Pais, DO  DIAGNOSIS:  Polycythemia vera, JAK2 negative.  CURRENT THERAPY: 1. Phlebotomy to maintain hematocrit below 48. 2. Aspirin 81 mg p.o. daily.  INTERIM HISTORY:  [name] comes in for followup.  He is doing well. He has had no problems since I last saw him in July.  He has had no fevers, sweats or chills.  He has had no headache.  There has been no pain in his hands or feet.  He has had no change in bowel or bladder habits.  He is off metformin now.  He is happy to be off metformin.  He said his blood sugars have been doing well.  He gets phlebotomized pretty much every 3 months or so.  PHYSICAL EXAMINATION:  This is a well-developed, well-nourished white gentleman in no obvious distress.  Vital Signs:  Temperature of 97.9, pulse 50, respiratory rate 18, blood pressure 120/76.  Weight is 214. Head and Neck:  Normocephalic, atraumatic skull.  There are no ocular or oral lesions.  There is no scleral icterus.  He has no conjunctival inflammation.  He has no adenopathy in the neck.  Lungs:  Clear bilaterally.  Cardiac:  Regular rate and rhythm with a normal S1, S2. No murmurs, rubs or bruits.  Abdomen:  Soft with good bowel sounds. There is no palpable abdominal mass.  There is no fluid wave.  There is no palpable hepatosplenomegaly.  Back:  No tenderness over the spine, ribs, or hips.  Extremities:  No clubbing, cyanosis or edema. Neurological:  No focal neurological deficits.  Skin:  No rashes or hyperpigmentation.  LABORATORY STUDIES:  White cell count is 10.2, hemoglobin 16.8, hematocrit 49.1, platelet count 224.  IMPRESSION:  [name] is a 71 year old gentleman with polycythemia. We will go ahead and phlebotomize him.  Will set this up within the week.  We will get him back in 3 months.  Three months works very well for him and his wife as they are very busy and do a lot of traveling.    ______________________________ Josph Macho,  M.D. PRE/MEDQ  D:  10/18/2011  T:  10/19/2011  Job:  [id number]

## 2011-10-20 ENCOUNTER — Encounter: Payer: Self-pay | Admitting: Internal Medicine

## 2011-10-23 ENCOUNTER — Encounter: Payer: Self-pay | Admitting: Internal Medicine

## 2011-10-23 ENCOUNTER — Other Ambulatory Visit: Payer: Self-pay | Admitting: *Deleted

## 2011-10-23 ENCOUNTER — Ambulatory Visit (HOSPITAL_BASED_OUTPATIENT_CLINIC_OR_DEPARTMENT_OTHER): Payer: Medicare Other

## 2011-10-23 DIAGNOSIS — D45 Polycythemia vera: Secondary | ICD-10-CM | POA: Diagnosis not present

## 2011-10-23 MED ORDER — METFORMIN HCL ER (MOD) 1000 MG PO TB24: 1000.0000 mg | ORAL_TABLET | Freq: Every day | ORAL | Status: DC

## 2011-10-23 NOTE — Telephone Encounter (Signed)
Sent in rx for glumetza 1000mg .

## 2011-10-23 NOTE — Patient Instructions (Signed)

## 2011-10-23 NOTE — Progress Notes (Signed)
Alexander [name] presents today for phlebotomy per MD orders. Phlebotomy procedure started at 0906 and ended at 0915 per Rosario Adie RN. 500 mls removed. Patient observed for 30 minutes after procedure without any incident. Patient tolerated procedure well. IV needle removed intact.

## 2011-10-24 ENCOUNTER — Encounter: Payer: Self-pay | Admitting: Internal Medicine

## 2011-10-24 MED ORDER — METFORMIN HCL ER (MOD) 1000 MG PO TB24: 1000.0000 mg | ORAL_TABLET | Freq: Every day | ORAL | Status: DC

## 2011-10-24 NOTE — Addendum Note (Signed)
Addended by: Meda Coffee on: 10/24/2011 01:05 PM   Modules accepted: Orders, Medications

## 2011-10-25 ENCOUNTER — Encounter: Payer: Self-pay | Admitting: Internal Medicine

## 2011-11-08 DIAGNOSIS — E1139 Type 2 diabetes mellitus with other diabetic ophthalmic complication: Secondary | ICD-10-CM | POA: Diagnosis not present

## 2011-11-15 ENCOUNTER — Encounter: Payer: Self-pay | Admitting: Internal Medicine

## 2011-11-15 MED ORDER — ALLOPURINOL 100 MG PO TABS: 100.0000 mg | ORAL_TABLET | Freq: Every day | ORAL | Status: DC

## 2011-11-15 MED ORDER — SIMVASTATIN 20 MG PO TABS: 20.0000 mg | ORAL_TABLET | Freq: Every day | ORAL | Status: DC

## 2011-11-15 NOTE — Addendum Note (Signed)
Addended by: Alfred Levins D on: 11/15/2011 01:19 PM   Modules accepted: Orders

## 2011-11-15 NOTE — Telephone Encounter (Signed)
Proctozone is not on med list.  Is this ok to fill?

## 2011-11-22 ENCOUNTER — Other Ambulatory Visit: Payer: Self-pay | Admitting: *Deleted

## 2011-11-22 MED ORDER — ACYCLOVIR 5 % EX OINT: TOPICAL_OINTMENT | CUTANEOUS | Status: DC

## 2011-11-22 MED ORDER — HYDROCORTISONE 2.5 % RE CREA: TOPICAL_CREAM | Freq: Two times a day (BID) | RECTAL | Status: DC

## 2011-11-23 ENCOUNTER — Telehealth: Payer: Self-pay | Admitting: Internal Medicine

## 2011-11-23 NOTE — Telephone Encounter (Signed)
Caller: Alexander [name]/Patient; Phone: [phone]; Reason for Call: Patient request to talk to [facility].  He states he saw Dr.  Artist Pais yesterday Proctozone-HC 2 1/2 % and asked to be sent to mail order pharmacy.  The pharmacy called him today, saying that the medication was sent for Protozole and his his drug formulary does not covered that and he would have to be paid FULL PRICE.  [name]  Please contact patient regarding mail order medication/name .

## 2011-11-23 NOTE — Telephone Encounter (Signed)
Dr Artist Pais, I looked in [employer] to see what he had taken last time and reordered it.  I don't know what medicine he is talking about

## 2011-11-24 ENCOUNTER — Other Ambulatory Visit: Payer: Self-pay | Admitting: Internal Medicine

## 2011-11-24 MED ORDER — HYDROCORTISONE 2.5 % RE CREA: TOPICAL_CREAM | Freq: Two times a day (BID) | RECTAL | Status: DC

## 2011-11-27 ENCOUNTER — Encounter: Payer: Self-pay | Admitting: Internal Medicine

## 2011-11-28 ENCOUNTER — Encounter: Payer: Self-pay | Admitting: Internal Medicine

## 2011-11-28 MED ORDER — HYDROCORTISONE ACETATE 25 MG RE SUPP: 25.0000 mg | Freq: Two times a day (BID) | RECTAL | Status: DC

## 2011-12-01 ENCOUNTER — Encounter: Payer: Self-pay | Admitting: Internal Medicine

## 2011-12-04 MED ORDER — HYDROCORTISONE 2.5 % RE CREA: TOPICAL_CREAM | Freq: Two times a day (BID) | RECTAL | Status: DC

## 2011-12-04 NOTE — Addendum Note (Signed)
Addended by: Alfred Levins D on: 12/04/2011 01:27 PM   Modules accepted: Orders

## 2011-12-04 NOTE — Addendum Note (Signed)
Addended by: Alfred Levins D on: 12/04/2011 10:53 AM   Modules accepted: Orders

## 2011-12-05 ENCOUNTER — Encounter: Payer: Self-pay | Admitting: Internal Medicine

## 2011-12-08 ENCOUNTER — Other Ambulatory Visit: Payer: Medicare Other

## 2011-12-09 ENCOUNTER — Encounter: Payer: Self-pay | Admitting: Hematology & Oncology

## 2011-12-12 ENCOUNTER — Telehealth: Payer: Self-pay | Admitting: *Deleted

## 2011-12-12 NOTE — Telephone Encounter (Signed)
Received an email from the pt stating...[name]"The schedule shows I have an appointment on January 14th for labs and Dr. Myna Hidalgo. The schedule also shows an appointment for labs on January 16th, which I believe is incorrect. Please let me know if the appointment for labs and Dr. Myna Hidalgo on January 14 is correct and the appointment on January 16th has been removed. Thank you Alexander [name] [phone]".   Replied to the pt as follows:  Alexander [name],   Your appointment on Jan 16th has been cancelled. We will see you in the office on Jan 14th as scheduled.   Happy Holidays!   Abiel Antrim   Also spoke to the pt at 1110.

## 2011-12-15 ENCOUNTER — Ambulatory Visit: Payer: Medicare Other | Admitting: Internal Medicine

## 2011-12-20 ENCOUNTER — Encounter: Payer: Self-pay | Admitting: Hematology & Oncology

## 2011-12-21 DIAGNOSIS — M79609 Pain in unspecified limb: Secondary | ICD-10-CM | POA: Diagnosis not present

## 2011-12-21 DIAGNOSIS — B351 Tinea unguium: Secondary | ICD-10-CM | POA: Diagnosis not present

## 2012-01-07 ENCOUNTER — Encounter: Payer: Self-pay | Admitting: Internal Medicine

## 2012-01-10 ENCOUNTER — Other Ambulatory Visit (INDEPENDENT_AMBULATORY_CARE_PROVIDER_SITE_OTHER): Payer: Medicare Other

## 2012-01-10 DIAGNOSIS — R7309 Other abnormal glucose: Secondary | ICD-10-CM

## 2012-01-10 DIAGNOSIS — I10 Essential (primary) hypertension: Secondary | ICD-10-CM

## 2012-01-10 LAB — HEMOGLOBIN A1C: Hgb A1c MFr Bld: 6.4 % (ref 4.6–6.5)

## 2012-01-10 LAB — BASIC METABOLIC PANEL
BUN: 22 mg/dL (ref 6–23)
Chloride: 104 mEq/L (ref 96–112)
Potassium: 4.6 mEq/L (ref 3.5–5.1)
Sodium: 139 mEq/L (ref 135–145)

## 2012-01-15 ENCOUNTER — Ambulatory Visit: Payer: Medicare Other | Admitting: Internal Medicine

## 2012-01-16 ENCOUNTER — Other Ambulatory Visit (HOSPITAL_BASED_OUTPATIENT_CLINIC_OR_DEPARTMENT_OTHER): Payer: Medicare Other | Admitting: Lab

## 2012-01-16 ENCOUNTER — Ambulatory Visit (HOSPITAL_BASED_OUTPATIENT_CLINIC_OR_DEPARTMENT_OTHER): Payer: Medicare Other | Admitting: Hematology & Oncology

## 2012-01-16 VITALS — BP 132/67 | HR 53 | Temp 97.3°F | Resp 18 | Ht 70.0 in | Wt 214.0 lb

## 2012-01-16 DIAGNOSIS — D45 Polycythemia vera: Secondary | ICD-10-CM

## 2012-01-16 DIAGNOSIS — E119 Type 2 diabetes mellitus without complications: Secondary | ICD-10-CM

## 2012-01-16 LAB — CBC WITH DIFFERENTIAL (CANCER CENTER ONLY)
BASO#: 0 10*3/uL (ref 0.0–0.2)
Eosinophils Absolute: 0.3 10*3/uL (ref 0.0–0.5)
HCT: 47.5 % (ref 38.7–49.9)
HGB: 15.9 g/dL (ref 13.0–17.1)
LYMPH#: 1.9 10*3/uL (ref 0.9–3.3)
MCHC: 33.5 g/dL (ref 32.0–35.9)
MONO#: 1.1 10*3/uL — ABNORMAL HIGH (ref 0.1–0.9)
NEUT#: 5 10*3/uL (ref 1.5–6.5)
NEUT%: 59.7 % (ref 40.0–80.0)
RBC: 5.1 10*6/uL (ref 4.20–5.70)
WBC: 8.3 10*3/uL (ref 4.0–10.0)

## 2012-01-16 LAB — FERRITIN: Ferritin: 41 ng/mL (ref 22–322)

## 2012-01-16 NOTE — Progress Notes (Signed)
This office note has been dictated.

## 2012-01-17 ENCOUNTER — Ambulatory Visit (INDEPENDENT_AMBULATORY_CARE_PROVIDER_SITE_OTHER): Payer: Medicare Other | Admitting: Internal Medicine

## 2012-01-17 ENCOUNTER — Encounter: Payer: Self-pay | Admitting: Internal Medicine

## 2012-01-17 VITALS — BP 112/74 | HR 80 | Temp 98.2°F | Wt 210.0 lb

## 2012-01-17 DIAGNOSIS — Z23 Encounter for immunization: Secondary | ICD-10-CM

## 2012-01-17 DIAGNOSIS — R7309 Other abnormal glucose: Secondary | ICD-10-CM

## 2012-01-17 DIAGNOSIS — R7303 Prediabetes: Secondary | ICD-10-CM

## 2012-01-17 DIAGNOSIS — I10 Essential (primary) hypertension: Secondary | ICD-10-CM | POA: Diagnosis not present

## 2012-01-17 NOTE — Assessment & Plan Note (Signed)
Blood pressure is well-controlled. His serum creatinine is higher at 1.4. Patient advised to monitor home blood pressure readings. If systolic blood pressure less than 110,  patient advised to decrease his Benicar dose to 10 mg once daily.  BP: 112/74 mmHg

## 2012-01-17 NOTE — Assessment & Plan Note (Signed)
Unfortunately patient unable to tolerate metformin. Continue low-carb diet regular exercise. If A1c worsens, we discussed starting Actos 15 mg. Lab Results  Component Value Date   HGBA1C 6.4 01/10/2012

## 2012-01-17 NOTE — Progress Notes (Signed)
Subjective:    Patient ID: Alexander [name], male    DOB: Jul 29, 1940, 72 y.o.   MRN: [id number]  HPI  72 year old white male for followup regarding prediabetes. Patient unable to tolerate metformin due to dizziness and nausea.  He continues to follow carb diet and exercise a regular basis. His weight has been fairly stable. Unfortunately his A1c is slightly worse at 6.4.  Polycythemia-followed by hematology. He's not needed recent phlebotomy.  Review of Systems Negative for chest pain  Past Medical History  Diagnosis Date  . GERD (gastroesophageal reflux disease)   . Hypertension   . Microalbuminuria   . Lumbar back pain   . Hematuria, microscopic   . Hyperglycemia   . Diverticulosis of colon   . Hyperlipidemia   . Gout   . Colon polyp   . GI bleed     secondary to Aspirin  . Polycythemia rubra vera     History   Social History  . Marital Status: Married    Spouse Name: N/A    Number of Children: 3  . Years of Education: N/A   Occupational History  . retired Art gallery manager    Social History Main Topics  . Smoking status: Never Smoker   . Smokeless tobacco: Never Used  . Alcohol Use: No  . Drug Use: No  . Sexually Active: Yes -- Male partner(s)   Other Topics Concern  . Not on file   Social History Narrative   Daughter, Elease Hashimoto (pediatrician--lives in [place], [place])    Past Surgical History  Procedure Date  . Cataract extraction   . Hernia repair   . Tonsillectomy     Family History  Problem Relation Age of Onset  . Cancer Mother     died at 64  . Heart disease Father 84    MI  . Diabetes      No Known Allergies  Current Outpatient Prescriptions on File Prior to Visit  Medication Sig Dispense Refill  . allopurinol (ZYLOPRIM) 100 MG tablet Take 1 tablet (100 mg total) by mouth daily.  90 tablet  1  . amLODipine (NORVASC) 5 MG tablet Take 1 tablet (5 mg total) by mouth daily.  90 tablet  3  . aspirin 81 MG tablet Take 81 mg by mouth daily.         [name] azelastine (ASTELIN) 137 MCG/[name] nasal [name] Place 2 sprays into the nose as needed.      [name] azelastine (OPTIVAR) 0.05 % ophthalmic solution Place 1 drop into both eyes 2 (two) times daily.       . Coenzyme Q10 (COQ-10) 75 MG CAPS Take 1 capsule by mouth as needed.       [name] olmesartan (BENICAR) 20 MG tablet Take 1 tablet (20 mg total) by mouth daily.  90 tablet  1  . simvastatin (ZOCOR) 20 MG tablet Take 1 tablet (20 mg total) by mouth daily.  90 tablet  1  . triamcinolone (NASACORT AQ) 55 MCG/ACT nasal inhaler Place 2 sprays into the nose daily.  3 Inhaler  1    BP 112/74  Pulse 80  Temp 98.2 F (36.8 C) (Oral)  Wt 210 lb (95.255 kg)       Objective:   Physical Exam  Constitutional: He is oriented to person, place, and time. He appears well-developed and well-nourished.  Cardiovascular: Normal rate, regular rhythm and normal heart sounds.   Pulmonary/Chest: Effort normal and breath sounds normal. He has no wheezes.  Musculoskeletal: He exhibits no  edema.  Neurological: He is alert and oriented to person, place, and time.  Psychiatric: He has a normal mood and affect. His behavior is normal.          Assessment & Plan:

## 2012-01-17 NOTE — Patient Instructions (Signed)
Monitor blood pressure at home. Systolic blood pressure less than 110 decrease Benicar dose to 10 mg once daily Please complete the following lab tests before your next follow up appointment: BMET, A1c - 401.9, 790.29

## 2012-01-17 NOTE — Progress Notes (Signed)
CC:   Alexander [name]. Artist Pais, DO  DIAGNOSIS:  Polycythemia vera-JAK2 negative.  CURRENT THERAPY: 1. Prior phlebotomy to maintain hematocrit below 48%. 2. Aspirin 81 mg p.o. daily.  INTERIM HISTORY:  Alexander [name] comes in for his followup.  He is doing quite well.  He got through the holidays without any problems.  He has lost some weight.  He is exercising.  He and his wife will be going back to [place] in February for 3 weeks. They usually do this once a year.  He is having some issues with diabetes.  He cannot tolerate metformin. Dr. Artist Pais is monitoring his blood sugars.  He has had no headache.  There is no cough or shortness breath.  There has been no change in bowel or bladder habits.  PHYSICAL EXAMINATION:  General:  This is a well-developed, well- nourished white gentleman in no obvious distress.  Vital signs: Temperature of 97.3, pulse 52, respiratory rate 18, blood pressure 132/67.  Weight is 214.  Head and neck:  Normocephalic, atraumatic skull.  There are no ocular or oral lesions.  There are no palpable cervical or supraclavicular lymph nodes.  Lungs:  Clear bilaterally. Cardiac:  Regular rate and rhythm with normal S1 and S2.  There are no murmurs, rubs, or bruits.  Abdomen:  Soft with good bowel sounds.  There is no palpable abdominal mass.  There is no palpable hepatosplenomegaly. Back:  No tenderness over the spine, ribs, or hips.  Extremities:  No clubbing, cyanosis, or edema.  Neurological:  No focal neurological deficits.  LABORATORY STUDIES:  White cell count is 8.3, hemoglobin 15.9, hematocrit 47.5, platelet count is 191.  IMPRESSION:  Alexander [name] is a 71 year old gentleman with polycythemia vera.  He has done incredibly well.  Again, he is JAK2 negative.  I think we can hold on phlebotomy for right now.  We will probably have to phlebotomize him when he comes back in 3 months.   ______________________________ Alexander [name], M.D. PRE/MEDQ  D:  01/16/2012  T:   01/17/2012  Job:  [id number]

## 2012-01-18 ENCOUNTER — Other Ambulatory Visit: Payer: Medicare Other | Admitting: Lab

## 2012-01-22 ENCOUNTER — Encounter: Payer: Self-pay | Admitting: Gastroenterology

## 2012-03-19 ENCOUNTER — Encounter: Payer: Self-pay | Admitting: Internal Medicine

## 2012-03-20 MED ORDER — TRIAMCINOLONE ACETONIDE(NASAL) 55 MCG/ACT NA INHA: 2.0000 | Freq: Every day | NASAL | Status: DC

## 2012-03-20 MED ORDER — OLMESARTAN MEDOXOMIL 20 MG PO TABS: 20.0000 mg | ORAL_TABLET | Freq: Every day | ORAL | Status: DC

## 2012-03-21 DIAGNOSIS — M79609 Pain in unspecified limb: Secondary | ICD-10-CM | POA: Diagnosis not present

## 2012-03-21 DIAGNOSIS — B351 Tinea unguium: Secondary | ICD-10-CM | POA: Diagnosis not present

## 2012-04-02 ENCOUNTER — Other Ambulatory Visit (INDEPENDENT_AMBULATORY_CARE_PROVIDER_SITE_OTHER): Payer: Medicare Other

## 2012-04-02 DIAGNOSIS — E785 Hyperlipidemia, unspecified: Secondary | ICD-10-CM | POA: Diagnosis not present

## 2012-04-02 DIAGNOSIS — I10 Essential (primary) hypertension: Secondary | ICD-10-CM | POA: Diagnosis not present

## 2012-04-02 DIAGNOSIS — R7303 Prediabetes: Secondary | ICD-10-CM

## 2012-04-02 DIAGNOSIS — R7309 Other abnormal glucose: Secondary | ICD-10-CM | POA: Diagnosis not present

## 2012-04-02 LAB — BASIC METABOLIC PANEL
Chloride: 103 mEq/L (ref 96–112)
Creatinine, Ser: 1.3 mg/dL (ref 0.4–1.5)
Potassium: 4.4 mEq/L (ref 3.5–5.1)
Sodium: 139 mEq/L (ref 135–145)

## 2012-04-02 LAB — HEMOGLOBIN A1C: Hgb A1c MFr Bld: 6.3 % (ref 4.6–6.5)

## 2012-04-02 LAB — TSH: TSH: 1.73 u[IU]/mL (ref 0.35–5.50)

## 2012-04-09 ENCOUNTER — Ambulatory Visit: Payer: Medicare Other | Admitting: Internal Medicine

## 2012-04-10 ENCOUNTER — Ambulatory Visit (INDEPENDENT_AMBULATORY_CARE_PROVIDER_SITE_OTHER): Payer: Medicare Other | Admitting: Internal Medicine

## 2012-04-10 ENCOUNTER — Encounter: Payer: Self-pay | Admitting: Internal Medicine

## 2012-04-10 VITALS — BP 120/74 | HR 56 | Temp 97.5°F | Wt 208.0 lb

## 2012-04-10 DIAGNOSIS — R7309 Other abnormal glucose: Secondary | ICD-10-CM

## 2012-04-10 DIAGNOSIS — I10 Essential (primary) hypertension: Secondary | ICD-10-CM | POA: Diagnosis not present

## 2012-04-10 DIAGNOSIS — R7303 Prediabetes: Secondary | ICD-10-CM

## 2012-04-10 NOTE — Assessment & Plan Note (Signed)
Improved with diet and exercise.  He prefers not to use Actos. Lab Results  Component Value Date   HGBA1C 6.3 04/02/2012

## 2012-04-10 NOTE — Patient Instructions (Signed)
Please complete the following lab tests before your next follow up appointment: BMET, A1c  -  790.29 FLP, LFTs, TSH - 244.9

## 2012-04-10 NOTE — Assessment & Plan Note (Signed)
Well controlled.  Cr is stable.  No change in medication regimen. BP: 120/74 mmHg  Lab Results  Component Value Date   CREATININE 1.3 04/02/2012

## 2012-04-10 NOTE — Progress Notes (Signed)
Subjective:    Patient ID: Alexander [name], male    DOB: [birth year], 72 y.o.   MRN: [id number]  HPI  72 year old white male with history of hypertension, prediabetes and polycythemia vera for followup.  Patient continues to exercise and has been diligent with low-carb diet. He reports losing some weight. His A1c has slightly improved to 6.3.  Allergic rhinitis-he is not having any flares.  Polycythemia vera-he has appointment with hematologist next week.  Review of Systems Negative for chest pain or shortness of breath  Past Medical History  Diagnosis Date  . GERD (gastroesophageal reflux disease)   . Hypertension   . Microalbuminuria   . Lumbar back pain   . Hematuria, microscopic   . Hyperglycemia   . Diverticulosis of colon   . Hyperlipidemia   . Gout   . Colon polyp   . GI bleed     secondary to Aspirin  . Polycythemia rubra vera     History   Social History  . Marital Status: Married    Spouse Name: N/A    Number of Children: 3  . Years of Education: N/A   Occupational History  . retired Art gallery manager    Social History Main Topics  . Smoking status: Never Smoker   . Smokeless tobacco: Never Used  . Alcohol Use: No  . Drug Use: No  . Sexually Active: Yes -- Male partner(s)   Other Topics Concern  . Not on file   Social History Narrative   Daughter, Elease Hashimoto (pediatrician--lives in [place], [place])    Past Surgical History  Procedure Laterality Date  . Cataract extraction    . Hernia repair    . Tonsillectomy      Family History  Problem Relation Age of Onset  . Cancer Mother     died at 33  . Heart disease Father 17    MI  . Diabetes      No Known Allergies  Current Outpatient Prescriptions on File Prior to Visit  Medication Sig Dispense Refill  . allopurinol (ZYLOPRIM) 100 MG tablet Take 1 tablet (100 mg total) by mouth daily.  90 tablet  1  . amLODipine (NORVASC) 5 MG tablet Take 1 tablet (5 mg total) by mouth daily.  90 tablet  3  .  aspirin 81 MG tablet Take 81 mg by mouth daily.        [name] azelastine (ASTELIN) 137 MCG/[name] nasal [name] Place 2 sprays into the nose as needed.      [name] azelastine (OPTIVAR) 0.05 % ophthalmic solution Place 1 drop into both eyes 2 (two) times daily.       . Coenzyme Q10 (COQ-10) 75 MG CAPS Take 1 capsule by mouth as needed.       [name] olmesartan (BENICAR) 20 MG tablet Take 1 tablet (20 mg total) by mouth daily.  90 tablet  1  . simvastatin (ZOCOR) 20 MG tablet Take 1 tablet (20 mg total) by mouth daily.  90 tablet  1  . triamcinolone (NASACORT AQ) 55 MCG/ACT nasal inhaler Place 2 sprays into the nose daily.  3 Inhaler  1   No current facility-administered medications on file prior to visit.    BP 120/74  Pulse 56  Temp(Src) 97.5 F (36.4 C) (Oral)  Wt 208 lb (94.348 kg)  BMI 29.84 kg/m2       Objective:   Physical Exam  Constitutional: He appears well-developed and well-nourished.  Neck:  No carotid bruit  Cardiovascular: Normal rate, regular  rhythm and normal heart sounds.   Pulmonary/Chest: Effort normal and breath sounds normal. He has no wheezes.          Assessment & Plan:

## 2012-04-11 ENCOUNTER — Other Ambulatory Visit (HOSPITAL_BASED_OUTPATIENT_CLINIC_OR_DEPARTMENT_OTHER): Payer: Medicare Other | Admitting: Lab

## 2012-04-11 ENCOUNTER — Ambulatory Visit (HOSPITAL_BASED_OUTPATIENT_CLINIC_OR_DEPARTMENT_OTHER): Payer: Medicare Other | Admitting: Hematology & Oncology

## 2012-04-11 VITALS — BP 116/62 | HR 56 | Temp 97.3°F | Resp 18 | Ht 70.0 in | Wt 212.0 lb

## 2012-04-11 DIAGNOSIS — Z7982 Long term (current) use of aspirin: Secondary | ICD-10-CM

## 2012-04-11 DIAGNOSIS — D45 Polycythemia vera: Secondary | ICD-10-CM

## 2012-04-11 LAB — CBC WITH DIFFERENTIAL (CANCER CENTER ONLY)
BASO#: 0 10*3/uL (ref 0.0–0.2)
EOS%: 6.8 % (ref 0.0–7.0)
HGB: 15.9 g/dL (ref 13.0–17.1)
LYMPH#: 2.1 10*3/uL (ref 0.9–3.3)
MCHC: 33.5 g/dL (ref 32.0–35.9)
NEUT#: 4.9 10*3/uL (ref 1.5–6.5)
RBC: 5.04 10*6/uL (ref 4.20–5.70)
WBC: 8.7 10*3/uL (ref 4.0–10.0)

## 2012-04-11 NOTE — Progress Notes (Signed)
This office note has been dictated.

## 2012-04-12 NOTE — Progress Notes (Signed)
CC:   Barbette Hair. Artist Pais, DO  DIAGNOSIS:  Polycythemia vera-JAK2 negative.  CURRENT THERAPY: 1. Phlebotomy to maintain hematocrit below 48%. 2. Aspirin 81 mg p.o. daily.  INTERIM HISTORY:  Mr. Manley comes in for a followup.  We see him every 3 months.  Since we last saw him, he had a great time in [place]. He and his wife go over there yearly.  They brought back pictures.  They really enjoyed themselves.  He has lost weight.  He is continuing to try to lose weight.  He is looking forward to exercising this summer when his pool opens back up.  He has had no problems with headache.  There has been no pruritus.  He has had no change in bowel or bladder habits.  There have been no rashes.  PHYSICAL EXAMINATION:  General:  This is a well-developed, well- nourished white gentleman in no obvious distress.  Vital signs: Temperature 97.3, pulse 56, respiratory rate 18, blood pressure 116/62. Weight is 212.  Head and neck:  Normocephalic, atraumatic skull.  There are no ocular or oral lesions.  There are no palpable cervical or supraclavicular lymph nodes.  Lungs:  Clear bilaterally.  Cardiac: Regular rate and rhythm with a normal S1 and S2.  There are no murmurs, rubs, or bruits.  Abdomen:  Soft with good bowel sounds.  There is no palpable abdominal mass.  There is no fluid wave.  There is no palpable hepatosplenomegaly.  Extremities:  No clubbing, cyanosis, or edema. Neurological:  No focal neurological deficits.  LABORATORY STUDIES:  White cell count is 8.7, hemoglobin 15.9, hematocrit 47.4, platelet count 188.  MCV is 94.  IMPRESSION:  Mr. Dolberry is a very nice 72 year old gentleman with polycythemia.  We phlebotomize him for a hematocrit over 48%.  So far, everything is looking quite good.  He has had no complications at all.  His last ferritin was 41.  Again, will get him back in 3 months' time.  He usually knows when he needs to be  phlebotomized.    ______________________________ Josph Macho, M.D. PRE/MEDQ  D:  04/11/2012  T:  04/12/2012  Job:  [id number]

## 2012-06-05 ENCOUNTER — Emergency Department (HOSPITAL_BASED_OUTPATIENT_CLINIC_OR_DEPARTMENT_OTHER)
Admission: EM | Admit: 2012-06-05 | Discharge: 2012-06-05 | Discharge status: Against Medical Advice | Payer: Medicare Other | Attending: Emergency Medicine | Admitting: Emergency Medicine

## 2012-06-05 ENCOUNTER — Encounter (HOSPITAL_BASED_OUTPATIENT_CLINIC_OR_DEPARTMENT_OTHER): Payer: Self-pay

## 2012-06-05 ENCOUNTER — Other Ambulatory Visit: Payer: Self-pay

## 2012-06-05 ENCOUNTER — Telehealth: Payer: Self-pay | Admitting: Gastroenterology

## 2012-06-05 DIAGNOSIS — M109 Gout, unspecified: Secondary | ICD-10-CM | POA: Insufficient documentation

## 2012-06-05 DIAGNOSIS — Z87448 Personal history of other diseases of urinary system: Secondary | ICD-10-CM | POA: Diagnosis not present

## 2012-06-05 DIAGNOSIS — E785 Hyperlipidemia, unspecified: Secondary | ICD-10-CM | POA: Insufficient documentation

## 2012-06-05 DIAGNOSIS — Z9889 Other specified postprocedural states: Secondary | ICD-10-CM | POA: Diagnosis not present

## 2012-06-05 DIAGNOSIS — IMO0002 Reserved for concepts with insufficient information to code with codable children: Secondary | ICD-10-CM | POA: Diagnosis not present

## 2012-06-05 DIAGNOSIS — I10 Essential (primary) hypertension: Secondary | ICD-10-CM | POA: Diagnosis not present

## 2012-06-05 DIAGNOSIS — Z860100 Personal history of colon polyps, unspecified: Secondary | ICD-10-CM | POA: Insufficient documentation

## 2012-06-05 DIAGNOSIS — Z8719 Personal history of other diseases of the digestive system: Secondary | ICD-10-CM | POA: Insufficient documentation

## 2012-06-05 DIAGNOSIS — K649 Unspecified hemorrhoids: Secondary | ICD-10-CM | POA: Diagnosis not present

## 2012-06-05 DIAGNOSIS — Z79899 Other long term (current) drug therapy: Secondary | ICD-10-CM | POA: Insufficient documentation

## 2012-06-05 DIAGNOSIS — K921 Melena: Secondary | ICD-10-CM | POA: Insufficient documentation

## 2012-06-05 DIAGNOSIS — Z7982 Long term (current) use of aspirin: Secondary | ICD-10-CM | POA: Insufficient documentation

## 2012-06-05 LAB — COMPREHENSIVE METABOLIC PANEL
ALT: 17 U/L (ref 0–53)
Alkaline Phosphatase: 81 U/L (ref 39–117)
BUN: 21 mg/dL (ref 6–23)
CO2: 24 mEq/L (ref 19–32)
Chloride: 105 mEq/L (ref 96–112)
GFR calc Af Amer: 68 mL/min — ABNORMAL LOW (ref >90)
GFR calc non Af Amer: 59 mL/min — ABNORMAL LOW (ref >90)
Glucose, Bld: 148 mg/dL — ABNORMAL HIGH (ref 70–99)
Potassium: 4 mEq/L (ref 3.5–5.1)
Total Bilirubin: 0.4 mg/dL (ref 0.3–1.2)
Total Protein: 7.2 g/dL (ref 6.0–8.3)

## 2012-06-05 LAB — CBC WITH DIFFERENTIAL/PLATELET
Eosinophils Absolute: 0.4 10*3/uL (ref 0.0–0.7)
Hemoglobin: 16.8 g/dL (ref 13.0–17.0)
Lymphocytes Relative: 20 % (ref 12–46)
Lymphs Abs: 2.1 10*3/uL (ref 0.7–4.0)
MCH: 32.3 pg (ref 26.0–34.0)
Monocytes Relative: 15 % — ABNORMAL HIGH (ref 3–12)
Neutrophils Relative %: 61 % (ref 43–77)
RBC: 5.2 MIL/uL (ref 4.22–5.81)
WBC: 10.5 10*3/uL (ref 4.0–10.5)

## 2012-06-05 LAB — PROTIME-INR: Prothrombin Time: 13.4 seconds (ref 11.6–15.2)

## 2012-06-05 NOTE — ED Provider Notes (Signed)
History  This chart was scribed for [name], MD by Bennett Scrape, ED Scribe. This patient was seen in room MH08/MH08 and the patient's care was started at 3:00 PM.  CSN: [id number]  Arrival date & time 06/05/12  1431   First MD Initiated Contact with Patient 06/05/12 1500      Chief Complaint  Patient presents with  . Rectal Bleeding     The history is provided by the patient. No language interpreter was used.    HPI Comments: Alexander [name] is a 72 y.o. male who presents to the Emergency Department complaining of one episode of rectal bleeding described as bright red blood while  having a BM this afternoon approximately 2 hours ago. Pt states that the toilet water was completely red and he had red with wiping. He denies having pain with wiping, excessive straining or hard stool at the time. He denies having pain associated with it. He reports having a h/o prior GI bleed with a colonoscopy that showed gastric irritation secondary to celebrexa and ASA. He states that he is still on ASA currently but has since stopped the celebrexa. He states that he also has a h/o hemorrhoids with similar less severe bleeding described as "small drops of blood" with hard stools. Last colonoscopy in 2011 showed polps, hemorrhoids and diverticuli, but pt denies having any complications since his last one. He denies having dizziness or lightheadedness with changing positions. He denies dizziness, lightheadedness, CP and SOB as associated symptoms. He also has a h/o GERD, HTN and HLD. Pt denies smoking and alcohol use.   Dr. Anselm Jungling is GI, states that he recently received a reminder for a colonoscopy check-up   Past Medical History  Diagnosis Date  . GERD (gastroesophageal reflux disease)   . Hypertension   . Microalbuminuria   . Lumbar back pain   . Hematuria, microscopic   . Hyperglycemia   . Diverticulosis of colon   . Hyperlipidemia   . Gout   . Colon polyp   . GI bleed     secondary  to Aspirin  . Polycythemia rubra vera     Past Surgical History  Procedure Laterality Date  . Cataract extraction    . Hernia repair    . Tonsillectomy      Family History  Problem Relation Age of Onset  . Cancer Mother     died at 44  . Heart disease Father 36    MI  . Diabetes      History  Substance Use Topics  . Smoking status: Never Smoker   . Smokeless tobacco: Never Used  . Alcohol Use: No      Review of Systems  A complete 10 system review of systems was obtained and all systems are negative except as noted in the HPI and PMH.   Allergies  Review of patient's allergies indicates no known allergies.  Home Medications   Current Outpatient Rx  Name  Route  Sig  Dispense  Refill  . allopurinol (ZYLOPRIM) 100 MG tablet   Oral   Take 1 tablet (100 mg total) by mouth daily.   90 tablet   1   . amLODipine (NORVASC) 5 MG tablet   Oral   Take 1 tablet (5 mg total) by mouth daily.   90 tablet   3   . aspirin 81 MG tablet   Oral   Take 81 mg by mouth daily.           [name]  azelastine (ASTELIN) 137 MCG/SPRAY nasal spray   Nasal   Place 2 sprays into the nose as needed.         [name] azelastine (OPTIVAR) 0.05 % ophthalmic solution   Both Eyes   Place 1 drop into both eyes 2 (two) times daily.          . Coenzyme Q10 (COQ-10) 75 MG CAPS   Oral   Take 1 capsule by mouth as needed.          [name] olmesartan (BENICAR) 20 MG tablet   Oral   Take 1 tablet (20 mg total) by mouth daily.   90 tablet   1   . simvastatin (ZOCOR) 20 MG tablet   Oral   Take 1 tablet (20 mg total) by mouth daily.   90 tablet   1   . triamcinolone (NASACORT AQ) 55 MCG/ACT nasal inhaler   Nasal   Place 2 sprays into the nose daily.   3 Inhaler   1     Triage Vitals: BP 147/86  Pulse 81  Temp(Src) 98.8 F (37.1 C) (Oral)  Resp 20  Ht 6' (1.829 m)  Wt 206 lb (93.441 kg)  BMI 27.93 kg/m2  SpO2 98%  Physical Exam  Nursing note and vitals reviewed. Constitutional:  He is oriented to person, place, and time. He appears well-developed and well-nourished. No distress.  HENT:  Head: Normocephalic and atraumatic.  Mouth/Throat: Oropharynx is clear and moist.  Eyes: Conjunctivae and EOM are normal. Pupils are equal, round, and reactive to light.  Neck: Normal range of motion. Neck supple. No tracheal deviation present.  Cardiovascular: Normal rate and regular rhythm.   Pulmonary/Chest: Effort normal and breath sounds normal. No respiratory distress.  Abdominal: Soft. There is no tenderness.  Genitourinary:  Small hemorrhoids present, pink mucoid discharge on finger, no fissures, chaperone present  Musculoskeletal: Normal range of motion.  Neurological: He is alert and oriented to person, place, and time.  Skin: Skin is warm and dry.  Psychiatric: He has a normal mood and affect. His behavior is normal.    ED Course  Procedures (including critical care time)  DIAGNOSTIC STUDIES: Oxygen Saturation is 98% on room air, normal by my interpretation.    COORDINATION OF CARE: 3:17 PM-Discussed treatment plan which includes CBC panel, CMP and troponin with pt at bedside and pt agreed to plan.   3:51 PM-Pt rechecked and still denies having any symptoms currently. Informed pt of positive hemoccult test and recommendation of observation. Pt declined observation. Discussed risks with pt and addressed symptoms to return for.  Labs Reviewed  CBC WITH DIFFERENTIAL - Abnormal; Notable for the following:    Monocytes Relative 15 (*)    Monocytes Absolute 1.6 (*)    All other components within normal limits  COMPREHENSIVE METABOLIC PANEL - Abnormal; Notable for the following:    Glucose, Bld 148 (*)    GFR calc non Af Amer 59 (*)    GFR calc Af Amer 68 (*)    All other components within normal limits  OCCULT BLOOD X 1 CARD TO LAB, STOOL - Abnormal; Notable for the following:    Fecal Occult Bld POSITIVE (*)    All other components within normal limits   PROTIME-INR  TROPONIN I   No results found.   1. Hematochezia       MDM  One episode of bright red blood per rectum this afternoon. This is painless. Denies any dizziness, lightheadedness, nausea or vomiting. Reports  history of hemorrhoids but never had this much bleeding.  Colonoscopy results 2011 ENDOSCOPIC IMPRESSION:  1) 7 mm sessile polyp in the cecum  2) 4 mm sessile polyp in the ascending colon  3) 4 mm sessile polyp in the proximal transverse colon  4) 5 mm sessile polyp in the distal transverse colon  5) Two 3mm sessile polyps in the rectum  6) Moderate diverticulosis in the sigmoid to descending  7) Internal and external hemorrhoids  Hemoglobin.  Orthostatics are negative. Patient denies any abdominal pain. No dizziness, lightheadedness, chest pain or shortness of breath. Explain to the patient that I cannot rule out diverticular bleeding. This seems more bleeding than would be expected from hemorrhoids. Admission for observation was offered to the patient which he declines.  Results discussed with Dr. Arlyce Dice who is on call for Dr. Russella Dar. He recommends patient call the office for urgent followup. He'll return to the ED for worsening bleeding, pain, dizziness, lightheadedness or any other concerns. Patient does not want to be admitted to the hospital. He is not tachycardic or hypotensive. He is not dizzy or lightheaded. Orthostatics are negative. Hemoglobin stable. He'll return with worsening bleeding, dizziness, lightheadedness, chest pain or shortness of breath. He realizes he is leaving against medical advice and can return at any time.   Date: 06/05/2012  Rate: 76  Rhythm: normal sinus rhythm  QRS Axis: normal  Intervals: normal  ST/T Wave abnormalities: normal  Conduction Disutrbances:none  Narrative Interpretation:   Old EKG Reviewed: unchanged      I personally performed the services described in this documentation, which was scribed in my presence. The  recorded information has been reviewed and is accurate.    [name], MD 06/05/12 [phone]

## 2012-06-05 NOTE — ED Notes (Signed)
Pt states that he has hx of hemorrhoids, and GI Bleeding (gi bleeding happened while taking celebrex and aspirin, pt states that after stopping these meds no further gi bleeding has occurred).  Today, onset of large amount of rectal bleeding assumed to be hemorrhoids after having BM>

## 2012-06-06 ENCOUNTER — Ambulatory Visit (INDEPENDENT_AMBULATORY_CARE_PROVIDER_SITE_OTHER): Payer: Medicare Other | Admitting: Physician Assistant

## 2012-06-06 ENCOUNTER — Encounter: Payer: Self-pay | Admitting: Physician Assistant

## 2012-06-06 VITALS — BP 144/84 | HR 68 | Ht 70.25 in | Wt 209.2 lb

## 2012-06-06 DIAGNOSIS — K625 Hemorrhage of anus and rectum: Secondary | ICD-10-CM

## 2012-06-06 DIAGNOSIS — Z860100 Personal history of colon polyps, unspecified: Secondary | ICD-10-CM

## 2012-06-06 DIAGNOSIS — K649 Unspecified hemorrhoids: Secondary | ICD-10-CM | POA: Diagnosis not present

## 2012-06-06 DIAGNOSIS — Z860101 Personal history of adenomatous and serrated colon polyps: Secondary | ICD-10-CM

## 2012-06-06 MED ORDER — MOVIPREP 100 G PO SOLR: 1.0000 | Freq: Once | ORAL | Status: DC

## 2012-06-06 NOTE — Patient Instructions (Addendum)
We sent the prescription for the Moviprep to [facility]. You have been scheduled for a colonoscopy with propofol. Please follow written instructions given to you at your visit today.  Please pick up your prep kit at the pharmacy within the next 1-3 days. If you use inhalers (even only as needed), please bring them with you on the day of your procedure. Your physician has requested that you go to www.[email or web address].com and enter the access code given to you at your visit today. This web site gives a general overview about your procedure. However, you should still follow specific instructions given to you by our office regarding your preparation for the procedure.

## 2012-06-06 NOTE — Telephone Encounter (Signed)
When I attempt to call I get a recorded message that "your phone call did not go through, please try your call later.

## 2012-06-06 NOTE — Progress Notes (Signed)
Subjective:    Patient ID: Alexander [name], male    DOB: 10/20/1940, 72 y.o.   MRN: [id number]  HPI  Alexander [name] is a very nice 72 year old white male known to Dr. Russella Dar with history of multiple adenomatous polyps. His last colonoscopy was done in February of 2011. He had 6 polyps removed at that time all of which were adenomatous. He also has moderate diverticular disease in the sigmoid to descending colon and internal and external hemorrhoids. He was scheduled for 3 year interval followup. He says he did receive a letter but had also looked in my chart where it listed "followup in 10 years" He says he has been doing well recently he does have history of polycythemia vera and had previously done phlebotomies but has not required any in the past couple of years. He is followed by Dr.Ennever. He also has history of hypertension, hyperlipidemia, nephrolithiasis, and family history of colon cancer in his mother. Patient had an ER visit yesterday after abrupt onset of rectal bleeding yesterday morning. He says he had a regular bowel movement followed by a gush of blood which scared him. He says there was lodged all over the commode and some on the floor. He was seen and evaluated in the ER noted to have heme positive stool with pinkish tinged heme noted on rectal exam. CBC was done showing a hemoglobin of 16.8 hematocrit 48.4. He was discharged and asked to follow up with GI. Patient says that this morning had a normal bowel movement with no visible blood and then early this afternoon had a second bowel movement with blood noted which was a significantly smaller amount than yesterday. He denies any abdominal pain cramping distention nausea etc. He says he has had some rectal itching and burning the past 24 hours    Review of Systems  Constitutional: Negative.   HENT: Negative.   Eyes: Negative.   Respiratory: Negative.   Cardiovascular: Negative.   Gastrointestinal: Positive for blood in stool.   Endocrine: Negative.   Genitourinary: Negative.   Musculoskeletal: Negative.   Skin: Negative.   Allergic/Immunologic: Negative.   Neurological: Negative.   Hematological: Negative.   Psychiatric/Behavioral: Negative.    Outpatient Prescriptions Prior to Visit  Medication Sig Dispense Refill  . allopurinol (ZYLOPRIM) 100 MG tablet Take 1 tablet (100 mg total) by mouth daily.  90 tablet  1  . amLODipine (NORVASC) 5 MG tablet Take 1 tablet (5 mg total) by mouth daily.  90 tablet  3  . aspirin 81 MG tablet Take 81 mg by mouth daily.        [name] azelastine (ASTELIN) 137 MCG/[name] nasal [name] Place 2 sprays into the nose as needed.      [name] azelastine (OPTIVAR) 0.05 % ophthalmic solution Place 1 drop into both eyes 2 (two) times daily.       . Coenzyme Q10 (COQ-10) 75 MG CAPS Take 1 capsule by mouth as needed.       [name] olmesartan (BENICAR) 20 MG tablet Take 1 tablet (20 mg total) by mouth daily.  90 tablet  1  . simvastatin (ZOCOR) 20 MG tablet Take 1 tablet (20 mg total) by mouth daily.  90 tablet  1  . triamcinolone (NASACORT AQ) 55 MCG/ACT nasal inhaler Place 2 sprays into the nose daily.  3 Inhaler  1   No facility-administered medications prior to visit.   Allergies  Allergen Reactions  . Metformin And Related Diarrhea       Patient Active  Problem List   Diagnosis Date Noted  . Allergic dermatitis 06/30/2011  . Shoulder pain 02/06/2011  . Allergic rhinitis 04/29/2010  . GERD 10/05/2008  . MICROALBUMINURIA 07/22/2008  . BACK PAIN, LUMBAR 06/26/2008  . MICROSCOPIC HEMATURIA 03/20/2007  . Prediabetes 12/13/2006  . POLYCYTHEMIA RUBRA VERA 11/26/2006  . HYPERLIPIDEMIA 11/26/2006  . GOUT 11/26/2006  . HYPERTENSION 11/26/2006  . DIVERTICULOSIS, COLON 11/26/2006  . INGUINAL PAIN, RIGHT 11/26/2006  . COLONIC POLYPS, HX OF 11/26/2006  . NEPHROLITHIASIS, HX OF 11/26/2006   History  Substance Use Topics  . Smoking status: Never Smoker   . Smokeless tobacco: Never Used  . Alcohol  Use: No   family history includes Colon cancer in his mother; Diabetes in his father and mother; Heart disease (age of onset: 73) in his father; and Stomach cancer in his maternal grandfather.  Objective:   Physical Exam  well-developed older white male in no acute distress, pleasant accompanied by his wife blood pressure 144/84 pulse 68 height 5 foot 10 weight 209. HEENT; nontraumatic normocephalic EOMI PERRLA sclera anicteric, Neck; supple no JVD, Cardiovascular; regular rate and rhythm with S1-S2 no murmur rub or gallop, Pulmonary; clear bilaterally, Abdomen; soft nontender nondistended bowel sounds are active there is no palpable mass or hepatosplenomegaly, Rectal; prolapsed internal hemorrhoid noninflamed nonbleeding, on anoscopy internal hemorrhoid identified but no friability or oozing of heme noted there is no blood in the rectal vault or stool in the rectal vault., Extremities; no clubbing cyanosis or edema skin warm and dry, Psych; mood and affect normal and appropriate.        Assessment & Plan:  #72  72 year old male with history of multiple adenomatous polyps due for followup colonoscopy #2 acute onset of rectal bleeding, painless and small volume-this may be hemorrhoidal though cannot be sure from today's anoscopy. Also consider self-limited small-volume diverticular bleed. #3 hypertension #4 positive family history of colon cancer in patient's mother #5 thyperlipidemia #6 history of polycythemia vera   Plan; Anusol HC suppositories at bedtime x7 days then as needed Patient is advised to call back should he have any ongoing significant bleeding over the next few days and would repeat CBC at that time. Will schedule for colonoscopy with Dr. Jalene Mullet discussed in detail with the patient and his wife and they're agreeable to proceed

## 2012-06-06 NOTE — Telephone Encounter (Signed)
Phone is busy I will continue to try and reach the patient  

## 2012-06-06 NOTE — Telephone Encounter (Signed)
Patient will be seen today at 2:30 with Mike Gip PA

## 2012-06-07 ENCOUNTER — Encounter: Payer: Self-pay | Admitting: Internal Medicine

## 2012-06-07 ENCOUNTER — Ambulatory Visit (INDEPENDENT_AMBULATORY_CARE_PROVIDER_SITE_OTHER): Payer: Medicare Other | Admitting: Internal Medicine

## 2012-06-07 VITALS — BP 138/84 | HR 80 | Temp 98.1°F | Wt 208.0 lb

## 2012-06-07 DIAGNOSIS — K625 Hemorrhage of anus and rectum: Secondary | ICD-10-CM | POA: Diagnosis not present

## 2012-06-07 NOTE — Assessment & Plan Note (Signed)
72 year old white male with recent episode of significant rectal bleeding. I would favor diverticular bleed versus external hemorrhoid. I suggest patient discontinue aspirin therapy. Patient also advised to avoid eating large volumes of popcorn. Proceed with followup colonoscopy with Dr. Russella Dar.

## 2012-06-07 NOTE — Progress Notes (Signed)
Subjective:    Patient ID: Alexander [name], male    DOB: 1940/02/29, 72 y.o.   MRN: [id number]  HPI  72 year old white male with history of hypertension, prediabetes and polycythemia vera for emergency room followup. Patient was seen on 06/05/2012 secondary to episode of significant bloody stools. Patient reports having 2-3 bowel movements.  His wife witnessed large volume of blood. Fortunately his hemoglobin was relatively stable. He was also seen by Dr. Ardell Isaacs nurse practitioner. There is question of external hemorrhoid versus diverticular bleed. Patient has had 2 bowel movements today without any problems with rectal bleeding.  Patient reports eating large amount of corn before his symptoms started. Patient scheduled for followup colonoscopy.  Review of Systems Negative for chest pain or shortness of breath  Past Medical History  Diagnosis Date  . GERD (gastroesophageal reflux disease)   . Hypertension   . Microalbuminuria   . Lumbar back pain   . Hematuria, microscopic   . Hyperglycemia   . Diverticulosis of colon   . Hyperlipidemia   . Gout   . Colon polyp   . GI bleed     secondary to Aspirin  . Polycythemia rubra vera   . Hemorrhoids   . Anal fissure     History   Social History  . Marital Status: Married    Spouse Name: N/A    Number of Children: 3  . Years of Education: N/A   Occupational History  . retired Art gallery manager    Social History Main Topics  . Smoking status: Never Smoker   . Smokeless tobacco: Never Used  . Alcohol Use: No  . Drug Use: No  . Sexually Active: Yes -- Male partner(s)   Other Topics Concern  . Not on file   Social History Narrative   Daughter, Elease Hashimoto (pediatrician--lives in [place], [place])    Past Surgical History  Procedure Laterality Date  . Cataract extraction Bilateral   . Inguinal hernia repair    . Tonsillectomy    . Hemorrhoid surgery    . Sabat    . Irrigation and debridement sebaceous cyst      Family  History  Problem Relation Age of Onset  . Colon cancer Mother     died at 30, colorectal  . Heart disease Father 47    MI  . Diabetes Father   . Diabetes Mother   . Stomach cancer Maternal Grandfather     Allergies  Allergen Reactions  . Metformin And Related Diarrhea    Current Outpatient Prescriptions on File Prior to Visit  Medication Sig Dispense Refill  . allopurinol (ZYLOPRIM) 100 MG tablet Take 1 tablet (100 mg total) by mouth daily.  90 tablet  1  . amLODipine (NORVASC) 5 MG tablet Take 1 tablet (5 mg total) by mouth daily.  90 tablet  3  . azelastine (ASTELIN) 137 MCG/[name] nasal [name] Place 2 sprays into the nose as needed.      [name] azelastine (OPTIVAR) 0.05 % ophthalmic solution Place 1 drop into both eyes 2 (two) times daily.       . Coenzyme Q10 (COQ-10) 75 MG CAPS Take 1 capsule by mouth as needed.       [name] MOVIPREP 100 G SOLR Take 1 kit (100 g total) by mouth once. "Pharmacist please use BIN: [id number] GROUP: [id number] ID: [id number] Call [phone] for pharmacy questions "Pt will save $10"  1 kit  0  . olmesartan (BENICAR) 20 MG tablet Take 1 tablet (20 mg total)  by mouth daily.  90 tablet  1  . simvastatin (ZOCOR) 20 MG tablet Take 1 tablet (20 mg total) by mouth daily.  90 tablet  1  . triamcinolone (NASACORT AQ) 55 MCG/ACT nasal inhaler Place 2 sprays into the nose daily.  3 Inhaler  1   No current facility-administered medications on file prior to visit.    BP 138/84  Pulse 80  Temp(Src) 98.1 F (36.7 C) (Oral)  Wt 208 lb (94.348 kg)  BMI 29.64 kg/m2       Objective:   Physical Exam  Constitutional: He is oriented to person, place, and time. He appears well-developed and well-nourished.  Cardiovascular: Normal rate, regular rhythm and normal heart sounds.   Pulmonary/Chest: Effort normal. He has no wheezes.  Abdominal: Soft. Bowel sounds are normal. He exhibits no mass. There is no tenderness.  Neurological: He is alert and oriented to person,  place, and time. No cranial nerve deficit.          Assessment & Plan:

## 2012-06-07 NOTE — Patient Instructions (Addendum)
Use Psyllium fiber laxative as directed Avoid eating popcorn Stop taking aspirin

## 2012-06-09 NOTE — Progress Notes (Signed)
Reviewed and agree with management plan.  Malcolm T. Stark, MD FACG 

## 2012-06-10 ENCOUNTER — Telehealth: Payer: Self-pay | Admitting: Hematology & Oncology

## 2012-06-10 NOTE — Telephone Encounter (Signed)
Left message on cell, home phone busy moved 7-10 time to 12pm

## 2012-06-12 ENCOUNTER — Telehealth: Payer: Self-pay | Admitting: Hematology & Oncology

## 2012-06-12 NOTE — Telephone Encounter (Signed)
Pt moved 7-10 to 7-11 due to lunch date

## 2012-06-13 DIAGNOSIS — B351 Tinea unguium: Secondary | ICD-10-CM | POA: Diagnosis not present

## 2012-06-13 DIAGNOSIS — M79609 Pain in unspecified limb: Secondary | ICD-10-CM | POA: Diagnosis not present

## 2012-07-02 DIAGNOSIS — D126 Benign neoplasm of colon, unspecified: Secondary | ICD-10-CM

## 2012-07-02 HISTORY — DX: Benign neoplasm of colon, unspecified: D12.6

## 2012-07-09 ENCOUNTER — Encounter: Payer: Self-pay | Admitting: Internal Medicine

## 2012-07-09 DIAGNOSIS — I10 Essential (primary) hypertension: Secondary | ICD-10-CM

## 2012-07-10 ENCOUNTER — Encounter: Payer: Self-pay | Admitting: Internal Medicine

## 2012-07-10 MED ORDER — AMLODIPINE BESYLATE 5 MG PO TABS: 5.0000 mg | ORAL_TABLET | Freq: Every day | ORAL | Status: DC

## 2012-07-10 MED ORDER — ALLOPURINOL 100 MG PO TABS: 100.0000 mg | ORAL_TABLET | Freq: Every day | ORAL | Status: DC

## 2012-07-10 MED ORDER — SIMVASTATIN 20 MG PO TABS: 20.0000 mg | ORAL_TABLET | Freq: Every day | ORAL | Status: DC

## 2012-07-11 ENCOUNTER — Other Ambulatory Visit: Payer: Medicare Other | Admitting: Lab

## 2012-07-11 ENCOUNTER — Ambulatory Visit: Payer: Medicare Other | Admitting: Hematology & Oncology

## 2012-07-12 ENCOUNTER — Ambulatory Visit (HOSPITAL_BASED_OUTPATIENT_CLINIC_OR_DEPARTMENT_OTHER): Payer: Medicare Other | Admitting: Hematology & Oncology

## 2012-07-12 ENCOUNTER — Other Ambulatory Visit (HOSPITAL_BASED_OUTPATIENT_CLINIC_OR_DEPARTMENT_OTHER): Payer: Medicare Other | Admitting: Lab

## 2012-07-12 VITALS — BP 121/65 | HR 59 | Temp 98.0°F | Resp 18 | Ht 70.0 in | Wt 207.0 lb

## 2012-07-12 DIAGNOSIS — D45 Polycythemia vera: Secondary | ICD-10-CM | POA: Diagnosis not present

## 2012-07-12 LAB — CBC WITH DIFFERENTIAL (CANCER CENTER ONLY)
BASO#: 0 10*3/uL (ref 0.0–0.2)
BASO%: 0.4 % (ref 0.0–2.0)
EOS%: 3.9 % (ref 0.0–7.0)
HCT: 49.3 % (ref 38.7–49.9)
HGB: 16.7 g/dL (ref 13.0–17.1)
LYMPH%: 20.7 % (ref 14.0–48.0)
MCH: 32.1 pg (ref 28.0–33.4)
MCHC: 33.9 g/dL (ref 32.0–35.9)
MCV: 95 fL (ref 82–98)
NEUT%: 63.6 % (ref 40.0–80.0)
RDW: 13.4 % (ref 11.1–15.7)

## 2012-07-12 NOTE — Progress Notes (Signed)
This office note has been dictated.

## 2012-07-13 NOTE — Progress Notes (Signed)
DIAGNOSIS:  Polycythemia vera - JAK2 negative.  CURRENT THERAPY: 1. Phlebotomy to maintain hematocrit below 48%. 2. Aspirin 81 mg p.o. daily.  INTERVAL HISTORY:  Alexander [name] comes in for a followup.  He recently was in the emergency room.  He had some lower GI bleeding.  He is going to see Alexander [name] next week for a colonoscopy.  He has had no other bleeding episodes since then.  He has had no abdominal pain.  He has had a good appetite.  He has had no nausea or vomiting.  When we last saw him, his ferritin was 41.  He has had no problems with fever, sweats or chills.  He is swimming every day.  He has done a good job with phlebotomies.  He has no problems when he gets phlebotomized.  PHYSICAL EXAMINATION:  General:  This is a well-developed, well- nourished white gentleman in no obvious distress.  Vital signs: Temperature of 98, pulse 59, respiratory rate 18, blood pressure 121/65. Weight is 207.  Head and Neck:  Shows a normocephalic, atraumatic skull. There are no ocular or oral lesions.  There are no palpable cervical or supraclavicular lymph nodes.  Lungs:  Clear bilaterally.  Cardiac: Regular rate and rhythm with a normal S1, S2.  There are no murmurs, rubs or bruits.  Abdomen:  Soft with good bowel sounds.  There is no palpable abdominal mass.  There is no palpable hepatosplenomegaly. Extremities:  Show no clubbing, cyanosis or edema.  He does have some varicose veins in his lower legs.  Neurological:  Shows no focal neurological deficits.  LABORATORY STUDIES:  White cell count is 9.2, hemoglobin 16.7, hematocrit 49.3, platelet count 191.  IMPRESSION:  Alexander [name] is a 72 year old gentleman with polycythemia. He has done quite well with phlebotomies.  He has had no complications with his polycythemia to date.  We will go ahead and get him set up next week for another phlebotomy. We will probably set this up after his colonoscopy is done.  We will plan to get him  back in another 3 months.  He typically goes 3 months in between phlebotomies.   ______________________________ Alexander [name], M.D. PRE/MEDQ  D:  07/12/2012  T:  07/13/2012  Job:  [id number]

## 2012-07-16 ENCOUNTER — Ambulatory Visit (AMBULATORY_SURGERY_CENTER): Payer: Medicare Other | Admitting: Gastroenterology

## 2012-07-16 ENCOUNTER — Encounter: Payer: Medicare Other | Admitting: Gastroenterology

## 2012-07-16 ENCOUNTER — Encounter: Payer: Self-pay | Admitting: Gastroenterology

## 2012-07-16 VITALS — BP 122/72 | HR 61 | Temp 97.4°F | Resp 15 | Ht 70.25 in | Wt 209.0 lb

## 2012-07-16 DIAGNOSIS — D126 Benign neoplasm of colon, unspecified: Secondary | ICD-10-CM

## 2012-07-16 DIAGNOSIS — Z86010 Personal history of colonic polyps: Secondary | ICD-10-CM

## 2012-07-16 DIAGNOSIS — K921 Melena: Secondary | ICD-10-CM

## 2012-07-16 DIAGNOSIS — I10 Essential (primary) hypertension: Secondary | ICD-10-CM | POA: Diagnosis not present

## 2012-07-16 DIAGNOSIS — K219 Gastro-esophageal reflux disease without esophagitis: Secondary | ICD-10-CM | POA: Diagnosis not present

## 2012-07-16 MED ORDER — SODIUM CHLORIDE 0.9 % IV SOLN: 500.0000 mL | INTRAVENOUS | Status: DC

## 2012-07-16 MED ORDER — HYDROCORTISONE ACETATE 25 MG RE SUPP: 25.0000 mg | Freq: Two times a day (BID) | RECTAL | Status: DC | PRN

## 2012-07-16 NOTE — Progress Notes (Signed)
Lidocaine-40mg IV prior to Propofol InductionPropofol given over incremental dosages 

## 2012-07-16 NOTE — Patient Instructions (Addendum)

## 2012-07-16 NOTE — Progress Notes (Signed)
Patient did not experience any of the following events: a burn prior to discharge; a fall within the facility; wrong site/side/patient/procedure/implant event; or a hospital transfer or hospital admission upon discharge from the facility. (G8907) Patient did not have preoperative order for IV antibiotic SSI prophylaxis. (G8918)  

## 2012-07-16 NOTE — Progress Notes (Signed)
Called to room to assist during endoscopic procedure.  Patient ID and intended procedure confirmed with present staff. Received instructions for my participation in the procedure from the performing physician.  

## 2012-07-16 NOTE — Op Note (Signed)
[facility] [address] [place], [address]   COLONOSCOPY PROCEDURE REPORT PATIENT: Cristhian, Vanhook  MR#: [id number] BIRTHDATE: 1940-04-30 , 72  yrs. old GENDER: Male ENDOSCOPIST: Meryl Dare, MD, [facility] PROCEDURE DATE:  07/16/2012 PROCEDURE:   Colonoscopy with biopsy and snare polypectomy ASA CLASS:   Class II INDICATIONS:Patient's personal history of adenomatous colon polyps and hematochezia. MEDICATIONS: MAC sedation, administered by CRNA and propofol (Diprivan) 200mg  IV DESCRIPTION OF PROCEDURE:   After the risks benefits and alternatives of the procedure were thoroughly explained, informed consent was obtained.  A digital rectal exam revealed no abnormalities of the rectum.   The LB [id number]  endoscope was introduced through the anus and advanced to the cecum, which was identified by both the appendix and ileocecal valve. No adverse events experienced.   The quality of the prep was excellent, using MoviPrep  The instrument was then slowly withdrawn as the colon was fully examined.  COLON FINDINGS: A sessile polyp measuring 7 mm in size was found at the cecum.  A polypectomy was performed with a cold snare.  The resection was complete and the polyp tissue was completely retrieved. A sessile polyp measuring 3 mm in size was found at the cecum.  A polypectomy was performed with cold forceps. The resection was complete and the polyp tissue was completely retrieved.   A sessile polyp measuring 5 mm in size was found in the transverse colon. A polypectomy was performed with a cold snare.  The resection was complete and the polyp tissue was completely retrieved. A sessile polyp measuring 4 mm in size was found in the sigmoid colon. A polypectomy was performed with cold forceps. The resection was complete and the polyp tissue was completely retrieved. Moderate diverticulosis was noted in the descending colon and sigmoid colon.   The colon was  otherwise normal.  There was no diverticulosis, inflammation, polyps or cancers unless previously stated.  Retroflexed views revealed internal hemorrhoids. The time to cecum=1 minutes 32 seconds. Withdrawal time=9 minutes 38 seconds. The scope was withdrawn and the procedure completed. COMPLICATIONS: There were no complications. ENDOSCOPIC IMPRESSION: 1.   Sessile polyp measuring 7 mm at the cecum; polypectomy performed with a cold snare 2.   Sessile polyp measuring 3 mm at the cecum; polypectomy performed with cold forceps 3.   Sessile polyp measuring 5 mm in the transverse colon; polypectomy performed with a cold snare 4.   Sessile polyp measuring 4 mm in the sigmoid colon; polypectomy performed with cold forceps 5.   Moderate diverticulosis was noted in the descending colon and sigmoid colon 6.   Moderate internal hemorrhoids RECOMMENDATIONS: 1.  Await pathology results 2.  Repeat Colonoscopy in 5 years. 3.  High fiber diet with liberal fluid intake.  eSigned:  Meryl Dare, MD, [facility] 07/16/2012 2:05 PM

## 2012-07-17 ENCOUNTER — Telehealth: Payer: Self-pay | Admitting: *Deleted

## 2012-07-17 NOTE — Telephone Encounter (Signed)
No answer, left message to call office if question or concerns.

## 2012-07-17 NOTE — Telephone Encounter (Signed)
Patient called to inform the staff that he has done fine following his procedure on 07/16/12. Patient stating he missed the call this am.

## 2012-07-19 ENCOUNTER — Ambulatory Visit (HOSPITAL_BASED_OUTPATIENT_CLINIC_OR_DEPARTMENT_OTHER): Payer: Medicare Other

## 2012-07-19 DIAGNOSIS — D45 Polycythemia vera: Secondary | ICD-10-CM

## 2012-07-19 NOTE — Patient Instructions (Signed)
Therapeutic Phlebotomy Therapeutic phlebotomy is the controlled removal of blood from your body for the purpose of treating a medical condition. It is similar to donating blood. Usually, about a pint (470 mL) of blood is removed. The average adult has 9 to 12 pints (4.3 to 5.7 L) of blood. Therapeutic phlebotomy may be used to treat the following medical conditions:  Hemochromatosis. This is a condition in which there is too much iron in the blood.  Polycythemia vera. This is a condition in which there are too many red cells in the blood.  Porphyria cutanea tarda. This is a disease usually passed from one generation to the next (inherited). It is a condition in which an important part of hemoglobin is not made properly. This results in the build up of abnormal amounts of porphyrins in the body.  Sickle cell disease. This is an inherited disease. It is a condition in which the red blood cells form an abnormal crescent shape rather than a round shape. LET YOUR CAREGIVER KNOW ABOUT:  Allergies.  Medicines taken including herbs, eyedrops, over-the-counter medicines, and creams.  Use of steroids (by mouth or creams).  Previous problems with anesthetics or numbing medicine.  History of blood clots.  History of bleeding or blood problems.  Previous surgery.  Possibility of pregnancy, if this applies. RISKS AND COMPLICATIONS This is a simple and safe procedure. Problems are unlikely. However, problems can occur and may include:  Nausea or lightheadedness.  Low blood pressure.  Soreness, bleeding, swelling, or bruising at the needle insertion site.  Infection. BEFORE THE PROCEDURE  This is a procedure that can be done as an outpatient. Confirm the time that you need to arrive for your procedure. Confirm whether there is a need to fast or withhold any medications. It is helpful to wear clothing with sleeves that can be raised above the elbow. A blood sample may be done to determine the  amount of red blood cells or iron in your blood. Plan ahead of time to have someone drive you home after the procedure. PROCEDURE The entire procedure from preparation through recovery takes about 1 hour. The actual collection takes about 10 to 15 minutes.  A needle will be inserted into your vein.  Tubing and a collection bag will be attached to that needle.  Blood will flow through the needle and tubing into the collection bag.  You may be asked to open and close your hand slowly and continuously during the entire collection.  Once the specified amount of blood has been removed from your body, the collection bag and tubing will be clamped.  The needle will be removed.  Pressure will be held on the site of the needle insertion to stop the bleeding. Then a bandage will be placed over the needle insertion site. AFTER THE PROCEDURE  Your recovery will be assessed and monitored. If there are no problems, as an outpatient, you should be able to go home shortly after the procedure.  Document Released: 05/23/2010 Document Revised: 03/13/2011 Document Reviewed: 05/23/2010 [employer] Patient Information 2014 [employer].  

## 2012-07-19 NOTE — Progress Notes (Signed)
Alexander [name] presents today for phlebotomy per MD orders. Completed by Alvino Chapel, RN. Phlebotomy procedure started at 0922 and ended at 0929. 500 grams removed. Patient observed for 30 minutes after procedure without any incident. Patient tolerated procedure well. IV needle removed intact.

## 2012-07-22 ENCOUNTER — Encounter: Payer: Self-pay | Admitting: Gastroenterology

## 2012-07-30 ENCOUNTER — Encounter: Payer: Self-pay | Admitting: Internal Medicine

## 2012-07-30 MED ORDER — OLMESARTAN MEDOXOMIL 20 MG PO TABS: 20.0000 mg | ORAL_TABLET | Freq: Every day | ORAL | Status: DC

## 2012-08-02 ENCOUNTER — Other Ambulatory Visit (INDEPENDENT_AMBULATORY_CARE_PROVIDER_SITE_OTHER): Payer: Medicare Other

## 2012-08-02 DIAGNOSIS — E785 Hyperlipidemia, unspecified: Secondary | ICD-10-CM | POA: Diagnosis not present

## 2012-08-02 DIAGNOSIS — E039 Hypothyroidism, unspecified: Secondary | ICD-10-CM

## 2012-08-02 DIAGNOSIS — R7309 Other abnormal glucose: Secondary | ICD-10-CM

## 2012-08-02 LAB — HEPATIC FUNCTION PANEL
Alkaline Phosphatase: 68 U/L (ref 39–117)
Bilirubin, Direct: 0 mg/dL (ref 0.0–0.3)
Total Bilirubin: 0.8 mg/dL (ref 0.3–1.2)
Total Protein: 7.2 g/dL (ref 6.0–8.3)

## 2012-08-02 LAB — LIPID PANEL
LDL Cholesterol: 86 mg/dL (ref 0–99)
Total CHOL/HDL Ratio: 3
Triglycerides: 87 mg/dL (ref 0.0–149.0)

## 2012-08-02 LAB — HEMOGLOBIN A1C: Hgb A1c MFr Bld: 6.2 % (ref 4.6–6.5)

## 2012-08-02 LAB — BASIC METABOLIC PANEL
CO2: 28 mEq/L (ref 19–32)
Calcium: 9.4 mg/dL (ref 8.4–10.5)
Chloride: 105 mEq/L (ref 96–112)
Creatinine, Ser: 1.2 mg/dL (ref 0.4–1.5)
Sodium: 140 mEq/L (ref 135–145)

## 2012-08-09 ENCOUNTER — Ambulatory Visit (INDEPENDENT_AMBULATORY_CARE_PROVIDER_SITE_OTHER): Payer: Medicare Other | Admitting: Internal Medicine

## 2012-08-09 ENCOUNTER — Encounter: Payer: Self-pay | Admitting: Internal Medicine

## 2012-08-09 VITALS — BP 122/70 | HR 54 | Temp 98.3°F | Wt 204.0 lb

## 2012-08-09 DIAGNOSIS — I10 Essential (primary) hypertension: Secondary | ICD-10-CM

## 2012-08-09 DIAGNOSIS — R7303 Prediabetes: Secondary | ICD-10-CM

## 2012-08-09 DIAGNOSIS — R7309 Other abnormal glucose: Secondary | ICD-10-CM | POA: Diagnosis not present

## 2012-08-09 DIAGNOSIS — K625 Hemorrhage of anus and rectum: Secondary | ICD-10-CM | POA: Diagnosis not present

## 2012-08-09 NOTE — Patient Instructions (Addendum)
Please complete the following lab tests before your next follow up appointment: BMET - 401.9 A1c - 790.29 

## 2012-08-09 NOTE — Assessment & Plan Note (Signed)
Presumed secondary to internal hemorrhoids. Patient completed colonoscopy. It showed diverticulosis and prominent internal hemorrhoids. Several small polyps were biopsied.

## 2012-08-09 NOTE — Progress Notes (Signed)
Subjective:    Patient ID: Alexander [name], male    DOB: 11/19/1940, 72 y.o.   MRN: [id number]  HPI  72 year old white male with history of hypertension, prediabetes and polycythemia vera for followup. Overall patient has been doing well. He was seen by Dr. Russella Dar for hematochezia. Colonoscopy was completed. Patient noted to have diverticulosis and prominent internal hemorrhoids. Several small polyps were biopsied. Patient reports rectal bleeding has resolved.  Prediabetes-stable. He continues to follow a fairly healthy diet and exercise on a regular basis.  Hypertension-stable.  Polycythemia vera-followed by Dr. Myna Hidalgo. He recently had phlebotomy.  Review of Systems Negative for chest pain  Past Medical History  Diagnosis Date  . GERD (gastroesophageal reflux disease)   . Hypertension   . Microalbuminuria   . Lumbar back pain   . Hematuria, microscopic   . Hyperglycemia   . Diverticulosis of colon   . Hyperlipidemia   . Gout   . Colon polyp   . GI bleed     secondary to Aspirin  . Polycythemia rubra vera   . Hemorrhoids   . Anal fissure     History   Social History  . Marital Status: Married    Spouse Name: N/A    Number of Children: 3  . Years of Education: N/A   Occupational History  . retired Art gallery manager    Social History Main Topics  . Smoking status: Never Smoker   . Smokeless tobacco: Never Used  . Alcohol Use: No  . Drug Use: No  . Sexually Active: Yes -- Male partner(s)   Other Topics Concern  . Not on file   Social History Narrative   Daughter, Elease Hashimoto (pediatrician--lives in [place], [place])    Past Surgical History  Procedure Laterality Date  . Cataract extraction Bilateral   . Inguinal hernia repair    . Tonsillectomy    . Hemorrhoid surgery    . Sabat    . Irrigation and debridement sebaceous cyst      Family History  Problem Relation Age of Onset  . Colon cancer Mother     died at 67, colorectal  . Heart disease Father 49   MI  . Diabetes Father   . Diabetes Mother   . Stomach cancer Maternal Grandfather     Allergies  Allergen Reactions  . Metformin And Related Diarrhea    Current Outpatient Prescriptions on File Prior to Visit  Medication Sig Dispense Refill  . allopurinol (ZYLOPRIM) 100 MG tablet Take 1 tablet (100 mg total) by mouth daily.  90 tablet  3  . amLODipine (NORVASC) 5 MG tablet Take 1 tablet (5 mg total) by mouth daily.  90 tablet  3  . azelastine (ASTELIN) 137 MCG/[name] nasal [name] Place 2 sprays into the nose as needed.      [name] azelastine (OPTIVAR) 0.05 % ophthalmic solution Place 1 drop into both eyes 2 (two) times daily.       . Coenzyme Q10 300 MG CAPS Take by mouth every morning.      . hydrocortisone (ANUSOL-HC) 25 MG suppository Place 1 suppository (25 mg total) rectally 2 (two) times daily as needed for hemorrhoids.  20 suppository  8  . olmesartan (BENICAR) 20 MG tablet Take 1 tablet (20 mg total) by mouth daily.  90 tablet  3  . simvastatin (ZOCOR) 20 MG tablet Take 1 tablet (20 mg total) by mouth daily.  90 tablet  3  . triamcinolone (NASACORT AQ) 55 MCG/ACT nasal inhaler Place  2 sprays into the nose daily.  3 Inhaler  1   No current facility-administered medications on file prior to visit.    BP 122/70  Pulse 54  Temp(Src) 98.3 F (36.8 C) (Oral)  Wt 204 lb (92.534 kg)  BMI 29.07 kg/m2       Objective:   Physical Exam  Constitutional: He is oriented to person, place, and time. He appears well-developed and well-nourished.  Cardiovascular: Normal rate, regular rhythm and normal heart sounds.   No murmur heard. Pulmonary/Chest: Effort normal and breath sounds normal. He has no wheezes.  Musculoskeletal: He exhibits no edema.  Neurological: He is alert and oriented to person, place, and time. No cranial nerve deficit.  Psychiatric: He has a normal mood and affect. His behavior is normal.          Assessment & Plan:

## 2012-08-09 NOTE — Assessment & Plan Note (Addendum)
Continue mgt with diet and exercise.  Monitor A1c.   Lab Results  Component Value Date   HGBA1C 6.2 08/02/2012

## 2012-08-09 NOTE — Assessment & Plan Note (Signed)
Well controlled.  GFR stable.  No change in medication. BP: 122/70 mmHg

## 2012-08-26 ENCOUNTER — Encounter: Payer: Self-pay | Admitting: Internal Medicine

## 2012-09-11 ENCOUNTER — Ambulatory Visit (INDEPENDENT_AMBULATORY_CARE_PROVIDER_SITE_OTHER): Payer: Medicare Other

## 2012-09-11 DIAGNOSIS — Z23 Encounter for immunization: Secondary | ICD-10-CM

## 2012-09-12 DIAGNOSIS — D235 Other benign neoplasm of skin of trunk: Secondary | ICD-10-CM | POA: Diagnosis not present

## 2012-09-12 DIAGNOSIS — B353 Tinea pedis: Secondary | ICD-10-CM | POA: Diagnosis not present

## 2012-09-19 DIAGNOSIS — B351 Tinea unguium: Secondary | ICD-10-CM | POA: Diagnosis not present

## 2012-09-19 DIAGNOSIS — M79609 Pain in unspecified limb: Secondary | ICD-10-CM | POA: Diagnosis not present

## 2012-10-01 ENCOUNTER — Encounter: Payer: Self-pay | Admitting: Internal Medicine

## 2012-10-03 ENCOUNTER — Ambulatory Visit (INDEPENDENT_AMBULATORY_CARE_PROVIDER_SITE_OTHER): Payer: Medicare Other | Admitting: Internal Medicine

## 2012-10-03 ENCOUNTER — Encounter: Payer: Self-pay | Admitting: Internal Medicine

## 2012-10-03 VITALS — BP 132/80 | HR 60 | Temp 98.0°F | Ht 70.25 in | Wt 206.0 lb

## 2012-10-03 DIAGNOSIS — M542 Cervicalgia: Secondary | ICD-10-CM | POA: Diagnosis not present

## 2012-10-03 DIAGNOSIS — Z23 Encounter for immunization: Secondary | ICD-10-CM

## 2012-10-03 MED ORDER — METHOCARBAMOL 500 MG PO TABS: 500.0000 mg | ORAL_TABLET | Freq: Two times a day (BID) | ORAL | Status: DC | PRN

## 2012-10-03 NOTE — Assessment & Plan Note (Signed)
I suspect patient has degenerative disc disease/spondylosis of cervical spine. Continue over-the-counter Tylenol as needed. Reviewed home exercises. Obtain cervical x-ray. If persistent or worsening symptoms, we discussed obtaining MRI of cervical spine.

## 2012-10-03 NOTE — Patient Instructions (Signed)
Perform home exercises as directed

## 2012-10-03 NOTE — Progress Notes (Signed)
Subjective:    Patient ID: Alexander [name], male    DOB: Oct 05, 1940, 72 y.o.   MRN: [id number]  HPI  72 year old white male with history of hypertension, hyperlipidemia and polycythemia vera complains of chronic neck pain for several weeks. His symptoms worse over the last 2 or 3 days. He describes as spasm-like sensation in right lower neck. Severity can be 10 out of 10 when he moves his neck in certain positions.  His symptoms are worse with laying on left side. His symptoms also exacerbated by prolonged sitting or driving.  He denies any upper extremity weakness. He has intermittent mild radiation to right upper shoulder.   Review of Systems See HPI     Past Medical History  Diagnosis Date  . GERD (gastroesophageal reflux disease)   . Hypertension   . Microalbuminuria   . Lumbar back pain   . Hematuria, microscopic   . Hyperglycemia   . Diverticulosis of colon   . Hyperlipidemia   . Gout   . Colon polyp   . GI bleed     secondary to Aspirin  . Polycythemia rubra vera   . Hemorrhoids   . Anal fissure     History   Social History  . Marital Status: Married    Spouse Name: N/A    Number of Children: 3  . Years of Education: N/A   Occupational History  . retired Art gallery manager    Social History Main Topics  . Smoking status: Never Smoker   . Smokeless tobacco: Never Used  . Alcohol Use: No  . Drug Use: No  . Sexual Activity: Yes    Partners: Female   Other Topics Concern  . Not on file   Social History Narrative   Daughter, Elease Hashimoto (pediatrician--lives in [place], [place])    Past Surgical History  Procedure Laterality Date  . Cataract extraction Bilateral   . Inguinal hernia repair    . Tonsillectomy    . Hemorrhoid surgery    . Sabat    . Irrigation and debridement sebaceous cyst      Family History  Problem Relation Age of Onset  . Colon cancer Mother     died at 43, colorectal  . Heart disease Father 31    MI  . Diabetes Father   . Diabetes  Mother   . Stomach cancer Maternal Grandfather     Allergies  Allergen Reactions  . Metformin And Related Diarrhea    Current Outpatient Prescriptions on File Prior to Visit  Medication Sig Dispense Refill  . allopurinol (ZYLOPRIM) 100 MG tablet Take 1 tablet (100 mg total) by mouth daily.  90 tablet  3  . amLODipine (NORVASC) 5 MG tablet Take 1 tablet (5 mg total) by mouth daily.  90 tablet  3  . azelastine (ASTELIN) 137 MCG/[name] nasal [name] Place 2 sprays into the nose as needed.      [name] azelastine (OPTIVAR) 0.05 % ophthalmic solution Place 1 drop into both eyes 2 (two) times daily.       . Coenzyme Q10 300 MG CAPS Take by mouth every morning.      . hydrocortisone (ANUSOL-HC) 25 MG suppository Place 1 suppository (25 mg total) rectally 2 (two) times daily as needed for hemorrhoids.  20 suppository  8  . olmesartan (BENICAR) 20 MG tablet Take 1 tablet (20 mg total) by mouth daily.  90 tablet  3  . simvastatin (ZOCOR) 20 MG tablet Take 1 tablet (20 mg total) by mouth  daily.  90 tablet  3  . triamcinolone (NASACORT AQ) 55 MCG/ACT nasal inhaler Place 2 sprays into the nose daily.  3 Inhaler  1   No current facility-administered medications on file prior to visit.    BP 132/80  Pulse 60  Temp(Src) 98 F (36.7 C) (Oral)  Ht 5' 10.25" (1.784 m)  Wt 206 lb (93.441 kg)  BMI 29.36 kg/m2    Objective:   Physical Exam  Constitutional: He is oriented to person, place, and time. He appears well-developed and well-nourished.  Cardiovascular: Normal rate, regular rhythm and normal heart sounds.   No murmur heard. Pulmonary/Chest: Effort normal and breath sounds normal. He has no wheezes.  Musculoskeletal: He exhibits no edema.  Neck discomfort with leftward rotation and right side bending.  Upper ext strength normal.  Neurological: He is oriented to person, place, and time. He displays normal reflexes. No cranial nerve deficit. He exhibits normal muscle tone.  Psychiatric: He has a  normal mood and affect. His behavior is normal.          Assessment & Plan:

## 2012-10-11 DIAGNOSIS — H10439 Chronic follicular conjunctivitis, unspecified eye: Secondary | ICD-10-CM | POA: Diagnosis not present

## 2012-10-11 DIAGNOSIS — H04129 Dry eye syndrome of unspecified lacrimal gland: Secondary | ICD-10-CM | POA: Diagnosis not present

## 2012-10-16 ENCOUNTER — Ambulatory Visit (HOSPITAL_BASED_OUTPATIENT_CLINIC_OR_DEPARTMENT_OTHER): Payer: Medicare Other

## 2012-10-16 ENCOUNTER — Ambulatory Visit (HOSPITAL_BASED_OUTPATIENT_CLINIC_OR_DEPARTMENT_OTHER): Payer: Medicare Other | Admitting: Hematology & Oncology

## 2012-10-16 ENCOUNTER — Other Ambulatory Visit (HOSPITAL_BASED_OUTPATIENT_CLINIC_OR_DEPARTMENT_OTHER): Payer: Medicare Other | Admitting: Lab

## 2012-10-16 VITALS — BP 130/72 | HR 63 | Temp 98.1°F | Resp 18 | Ht 70.0 in | Wt 209.0 lb

## 2012-10-16 DIAGNOSIS — D45 Polycythemia vera: Secondary | ICD-10-CM

## 2012-10-16 LAB — IRON AND TIBC CHCC
%SAT: 47 % (ref 20–55)
Iron: 144 ug/dL (ref 42–163)
TIBC: 309 ug/dL (ref 202–409)

## 2012-10-16 LAB — CBC WITH DIFFERENTIAL (CANCER CENTER ONLY)
EOS%: 4.5 % (ref 0.0–7.0)
Eosinophils Absolute: 0.3 10*3/uL (ref 0.0–0.5)
LYMPH#: 1.8 10*3/uL (ref 0.9–3.3)
LYMPH%: 23.1 % (ref 14.0–48.0)
MCH: 31.4 pg (ref 28.0–33.4)
MCHC: 33.5 g/dL (ref 32.0–35.9)
MCV: 94 fL (ref 82–98)
MONO%: 12.2 % (ref 0.0–13.0)
NEUT#: 4.6 10*3/uL (ref 1.5–6.5)
Platelets: 191 10*3/uL (ref 145–400)
RBC: 5.26 10*6/uL (ref 4.20–5.70)
RDW: 13.1 % (ref 11.1–15.7)

## 2012-10-16 LAB — FERRITIN CHCC: Ferritin: 40 ng/ml (ref 22–316)

## 2012-10-16 NOTE — Progress Notes (Signed)
Alexander [name] presents today for phlebotomy per MD orders. Phlebotomy procedure started at 0945 and ended at 1000. 500 grams removed. Patient observed for 30 minutes after procedure without any incident. Patient tolerated procedure well. IV needle removed intact.

## 2012-10-16 NOTE — Patient Instructions (Signed)
Therapeutic Phlebotomy Therapeutic phlebotomy is the controlled removal of blood from your body for the purpose of treating a medical condition. It is similar to donating blood. Usually, about a pint (470 mL) of blood is removed. The average adult has 9 to 12 pints (4.3 to 5.7 L) of blood. Therapeutic phlebotomy may be used to treat the following medical conditions:  Hemochromatosis. This is a condition in which there is too much iron in the blood.  Polycythemia vera. This is a condition in which there are too many red cells in the blood.  Porphyria cutanea tarda. This is a disease usually passed from one generation to the next (inherited). It is a condition in which an important part of hemoglobin is not made properly. This results in the build up of abnormal amounts of porphyrins in the body.  Sickle cell disease. This is an inherited disease. It is a condition in which the red blood cells form an abnormal crescent shape rather than a round shape. LET YOUR CAREGIVER KNOW ABOUT:  Allergies.  Medicines taken including herbs, eyedrops, over-the-counter medicines, and creams.  Use of steroids (by mouth or creams).  Previous problems with anesthetics or numbing medicine.  History of blood clots.  History of bleeding or blood problems.  Previous surgery.  Possibility of pregnancy, if this applies. RISKS AND COMPLICATIONS This is a simple and safe procedure. Problems are unlikely. However, problems can occur and may include:  Nausea or lightheadedness.  Low blood pressure.  Soreness, bleeding, swelling, or bruising at the needle insertion site.  Infection. BEFORE THE PROCEDURE  This is a procedure that can be done as an outpatient. Confirm the time that you need to arrive for your procedure. Confirm whether there is a need to fast or withhold any medications. It is helpful to wear clothing with sleeves that can be raised above the elbow. A blood sample may be done to determine the  amount of red blood cells or iron in your blood. Plan ahead of time to have someone drive you home after the procedure. PROCEDURE The entire procedure from preparation through recovery takes about 1 hour. The actual collection takes about 10 to 15 minutes.  A needle will be inserted into your vein.  Tubing and a collection bag will be attached to that needle.  Blood will flow through the needle and tubing into the collection bag.  You may be asked to open and close your hand slowly and continuously during the entire collection.  Once the specified amount of blood has been removed from your body, the collection bag and tubing will be clamped.  The needle will be removed.  Pressure will be held on the site of the needle insertion to stop the bleeding. Then a bandage will be placed over the needle insertion site. AFTER THE PROCEDURE  Your recovery will be assessed and monitored. If there are no problems, as an outpatient, you should be able to go home shortly after the procedure.  Document Released: 05/23/2010 Document Revised: 03/13/2011 Document Reviewed: 05/23/2010 [employer] Patient Information 2014 [employer].  

## 2012-10-16 NOTE — Progress Notes (Signed)
This office note has been dictated.

## 2012-10-17 NOTE — Progress Notes (Signed)
CC:   Alexander [name]. Artist Pais, DO  DIAGNOSIS:  Polycythemia vera, Alexander [name] 2 negative.  CURRENT THERAPY: 1. Phlebotomy to maintain hematocrit below 48%. 2. Aspirin 81 mg p.o. daily.  INTERIM HISTORY:  Alexander [name] comes in for his followup.  The problem now is with his wife.  She recently was found to have ductal carcinoma of the right breast.  She had an MRI which showed an 8.4 cm mass.  She, herself, really did not notice anything with respect to the right breast.  She is going for a double mastectomy tomorrow.  There is a very strong family history of breast cancer in her family.  She is Jewish. Some family members have been tested for BRCA, and these were negative. She will get tested herself.  He has had no problems himself.  He has had no nausea or vomiting. There has been no headache.  He has had some neck and arm pain.  This comes and goes.  He had a good summer.  He swam a lot.  He likes to swim.  There has been no problem with bowels or bladder.  He has had no rashes.  When we last saw him, his ferritin was 60.  PHYSICAL EXAMINATION:  General:  This is a well-developed, well- nourished white gentleman in no obvious distress.  Vital signs: Temperature of 98.1, pulse 63, respiratory rate 18, blood pressure 130/72.  Weight is 209 pounds.  Head and neck:  Normocephalic, atraumatic skull.  There are no ocular or oral lesions.  There are no palpable cervical or supraclavicular lymph nodes.  Lungs:  Clear bilaterally.  Cardiac:  Regular rate and rhythm with a normal S1 and S2. There are no murmurs, rubs or bruits.  Abdomen:  Soft.  He has good bowel sounds.  There is no fluid wave.  There is no palpable abdominal mass.  There is no palpable hepatosplenomegaly.  Extremities:  No clubbing, cyanosis or edema.  Neurologic:  No focal neurological deficits.  Skin:  No rashes, ecchymosis, or petechia.  LABORATORY STUDIES:  White cell count is 7.6, hemoglobin 16.5, hematocrit 49.3, platelet  count 191.  MCV is 94.  IMPRESSION:  Alexander [name] is a 72 year old gentleman.  He has polycythemia.  We like to keep his hematocrit below 48%.  I think this is acceptable for him.  He is on his aspirin.  I think this is incredibly vital for him.  I think were will have to phlebotomize him today.  I want him 1 back in 2 months.  I think this would be reasonable so that we can maintain close vigilance on him.    ______________________________ Josph Macho, M.D. PRE/MEDQ  D:  10/15/2012  T:  10/17/2012  Job:  [id number]

## 2012-10-28 ENCOUNTER — Encounter: Payer: Self-pay | Admitting: Internal Medicine

## 2012-11-01 ENCOUNTER — Encounter: Payer: Self-pay | Admitting: Internal Medicine

## 2012-11-01 ENCOUNTER — Ambulatory Visit (INDEPENDENT_AMBULATORY_CARE_PROVIDER_SITE_OTHER): Payer: Medicare Other | Admitting: Internal Medicine

## 2012-11-01 VITALS — BP 132/90 | HR 56 | Temp 98.0°F | Ht 70.0 in | Wt 207.0 lb

## 2012-11-01 DIAGNOSIS — M542 Cervicalgia: Secondary | ICD-10-CM | POA: Diagnosis not present

## 2012-11-01 DIAGNOSIS — J309 Allergic rhinitis, unspecified: Secondary | ICD-10-CM | POA: Diagnosis not present

## 2012-11-01 MED ORDER — TRIAMCINOLONE ACETONIDE 55 MCG/ACT NA AERO: 2.0000 | INHALATION_SPRAY | Freq: Every day | NASAL | Status: DC

## 2012-11-01 NOTE — Assessment & Plan Note (Signed)
Improved with home exercises.  Patient advised to perform "home PT" for 4-6 weeks.  Expectant management

## 2012-11-01 NOTE — Progress Notes (Signed)
Subjective:    Patient ID: Alexander [name], male    DOB: 07-21-1940, 72 y.o.   MRN: [id number]  HPI  72 year old white male previously seen for intermittent neck pain for followup. Patient reports his neck discomfort has significantly improved. He performed home exercises a few times. The symptoms always worse when sitting in front of a computer for prolonged periods. He has bilateral distal upper extremity numbness is transient in nature. He denies any upper extremity weakness.  Allergic rhinitis-patient reports symptoms controlled with Nasacort and intranasal saline. Review of Systems Negative for weakness, no persistent numbness  Past Medical History  Diagnosis Date  . GERD (gastroesophageal reflux disease)   . Hypertension   . Microalbuminuria   . Lumbar back pain   . Hematuria, microscopic   . Hyperglycemia   . Diverticulosis of colon   . Hyperlipidemia   . Gout   . Colon polyp   . GI bleed     secondary to Aspirin  . Polycythemia rubra vera   . Hemorrhoids   . Anal fissure     History   Social History  . Marital Status: Married    Spouse Name: N/A    Number of Children: 3  . Years of Education: N/A   Occupational History  . retired Art gallery manager    Social History Main Topics  . Smoking status: Never Smoker   . Smokeless tobacco: Never Used  . Alcohol Use: No  . Drug Use: No  . Sexual Activity: Yes    Partners: Female   Other Topics Concern  . Not on file   Social History Narrative   Daughter, Alexander [name] (pediatrician--lives in [place], [place])    Past Surgical History  Procedure Laterality Date  . Cataract extraction Bilateral   . Inguinal hernia repair    . Tonsillectomy    . Hemorrhoid surgery    . Sabat    . Irrigation and debridement sebaceous cyst      Family History  Problem Relation Age of Onset  . Colon cancer Mother     died at 60, colorectal  . Heart disease Father 74    MI  . Diabetes Father   . Diabetes Mother   . Stomach cancer  Maternal Grandfather     Allergies  Allergen Reactions  . Metformin And Related Diarrhea    Current Outpatient Prescriptions on File Prior to Visit  Medication Sig Dispense Refill  . allopurinol (ZYLOPRIM) 100 MG tablet Take 1 tablet (100 mg total) by mouth daily.  90 tablet  3  . amLODipine (NORVASC) 5 MG tablet Take 1 tablet (5 mg total) by mouth daily.  90 tablet  3  . azelastine (ASTELIN) 137 MCG/[name] nasal [name] Place 2 sprays into the nose as needed.      [name] azelastine (OPTIVAR) 0.05 % ophthalmic solution Place 1 drop into both eyes 2 (two) times daily.       . Coenzyme Q10 300 MG CAPS Take by mouth every morning.      . hydrocortisone (ANUSOL-HC) 25 MG suppository Place 1 suppository (25 mg total) rectally 2 (two) times daily as needed for hemorrhoids.  20 suppository  8  . methocarbamol (ROBAXIN) 500 MG tablet Take 1 tablet (500 mg total) by mouth 2 (two) times daily as needed.  60 tablet  1  . olmesartan (BENICAR) 20 MG tablet Take 1 tablet (20 mg total) by mouth daily.  90 tablet  3  . simvastatin (ZOCOR) 20 MG tablet Take 1 tablet (  20 mg total) by mouth daily.  90 tablet  3   No current facility-administered medications on file prior to visit.    BP 132/90  Pulse 56  Temp(Src) 98 F (36.7 C) (Oral)  Ht 5\' 10"  (1.778 m)  Wt 207 lb (93.895 kg)  BMI 29.7 kg/m2       Objective:   Physical Exam  Constitutional: He is oriented to person, place, and time. He appears well-developed and well-nourished.  Cardiovascular: Normal rate, regular rhythm and normal heart sounds.   No murmur heard. Pulmonary/Chest: Effort normal and breath sounds normal. He has no wheezes.  Musculoskeletal: He exhibits no edema.  Neurological: He is alert and oriented to person, place, and time. No cranial nerve deficit.  Psychiatric: He has a normal mood and affect. His behavior is normal.          Assessment & Plan:

## 2012-11-01 NOTE — Assessment & Plan Note (Signed)
Continue Nasacort as directed.

## 2012-11-12 DIAGNOSIS — M542 Cervicalgia: Secondary | ICD-10-CM | POA: Diagnosis not present

## 2012-11-18 ENCOUNTER — Other Ambulatory Visit: Payer: Self-pay | Admitting: Internal Medicine

## 2012-11-18 DIAGNOSIS — G8929 Other chronic pain: Secondary | ICD-10-CM

## 2012-11-22 ENCOUNTER — Encounter: Payer: Self-pay | Admitting: Internal Medicine

## 2012-11-22 DIAGNOSIS — M542 Cervicalgia: Secondary | ICD-10-CM

## 2012-11-22 DIAGNOSIS — M549 Dorsalgia, unspecified: Secondary | ICD-10-CM

## 2012-12-01 ENCOUNTER — Encounter: Payer: Self-pay | Admitting: Internal Medicine

## 2012-12-01 ENCOUNTER — Emergency Department (HOSPITAL_COMMUNITY)
Admission: EM | Admit: 2012-12-01 | Discharge: 2012-12-01 | Disposition: A | Discharge status: Home / Self Care | Payer: Medicare Other | Attending: Emergency Medicine | Admitting: Emergency Medicine

## 2012-12-01 ENCOUNTER — Emergency Department (HOSPITAL_COMMUNITY): Payer: Medicare Other

## 2012-12-01 DIAGNOSIS — R404 Transient alteration of awareness: Secondary | ICD-10-CM | POA: Diagnosis not present

## 2012-12-01 DIAGNOSIS — Z860100 Personal history of colon polyps, unspecified: Secondary | ICD-10-CM | POA: Insufficient documentation

## 2012-12-01 DIAGNOSIS — Z79899 Other long term (current) drug therapy: Secondary | ICD-10-CM | POA: Diagnosis not present

## 2012-12-01 DIAGNOSIS — E876 Hypokalemia: Secondary | ICD-10-CM | POA: Diagnosis not present

## 2012-12-01 DIAGNOSIS — I10 Essential (primary) hypertension: Secondary | ICD-10-CM | POA: Diagnosis not present

## 2012-12-01 DIAGNOSIS — E785 Hyperlipidemia, unspecified: Secondary | ICD-10-CM | POA: Insufficient documentation

## 2012-12-01 DIAGNOSIS — R079 Chest pain, unspecified: Secondary | ICD-10-CM | POA: Insufficient documentation

## 2012-12-01 DIAGNOSIS — R42 Dizziness and giddiness: Secondary | ICD-10-CM | POA: Diagnosis not present

## 2012-12-01 DIAGNOSIS — R0789 Other chest pain: Secondary | ICD-10-CM | POA: Diagnosis not present

## 2012-12-01 DIAGNOSIS — R748 Abnormal levels of other serum enzymes: Secondary | ICD-10-CM | POA: Diagnosis not present

## 2012-12-01 DIAGNOSIS — E875 Hyperkalemia: Secondary | ICD-10-CM | POA: Insufficient documentation

## 2012-12-01 DIAGNOSIS — Z862 Personal history of diseases of the blood and blood-forming organs and certain disorders involving the immune mechanism: Secondary | ICD-10-CM | POA: Diagnosis not present

## 2012-12-01 DIAGNOSIS — M109 Gout, unspecified: Secondary | ICD-10-CM | POA: Diagnosis not present

## 2012-12-01 LAB — BASIC METABOLIC PANEL
CO2: 29 mEq/L (ref 19–32)
Calcium: 9.4 mg/dL (ref 8.4–10.5)
Creatinine, Ser: 1.28 mg/dL (ref 0.50–1.35)
GFR calc non Af Amer: 54 mL/min — ABNORMAL LOW (ref >90)
Glucose, Bld: 118 mg/dL — ABNORMAL HIGH (ref 70–99)
Potassium: 4.3 mEq/L (ref 3.5–5.1)

## 2012-12-01 LAB — COMPREHENSIVE METABOLIC PANEL
AST: 20 U/L (ref 0–37)
Albumin: 3.7 g/dL (ref 3.5–5.2)
BUN: 22 mg/dL (ref 6–23)
Calcium: 9.5 mg/dL (ref 8.4–10.5)
Creatinine, Ser: 1.28 mg/dL (ref 0.50–1.35)
Total Bilirubin: 0.7 mg/dL (ref 0.3–1.2)
Total Protein: 7.1 g/dL (ref 6.0–8.3)

## 2012-12-01 LAB — CBC
HCT: 48.2 % (ref 39.0–52.0)
MCH: 31.5 pg (ref 26.0–34.0)
MCHC: 34.6 g/dL (ref 30.0–36.0)
MCV: 90.9 fL (ref 78.0–100.0)
Platelets: 214 10*3/uL (ref 150–400)
RDW: 13.4 % (ref 11.5–15.5)

## 2012-12-01 LAB — POCT I-STAT TROPONIN I: Troponin i, poc: 0 ng/mL (ref 0.00–0.08)

## 2012-12-01 LAB — PROTIME-INR
INR: 1.12 (ref 0.00–1.49)
Prothrombin Time: 14.2 seconds (ref 11.6–15.2)

## 2012-12-01 LAB — LIPASE, BLOOD: Lipase: 69 U/L — ABNORMAL HIGH (ref 11–59)

## 2012-12-01 LAB — TROPONIN I: Troponin I: <0.3 ng/mL (ref <0.30)

## 2012-12-01 MED ORDER — ASPIRIN 81 MG PO CHEW: 324.0000 mg | CHEWABLE_TABLET | Freq: Once | ORAL | Status: DC

## 2012-12-01 MED ORDER — SODIUM POLYSTYRENE SULFONATE 15 GM/60ML PO SUSP
15.0000 g | Freq: Once | ORAL | Status: AC
  Administered 2012-12-01: 15 g via ORAL
  Filled 2012-12-01: qty 60

## 2012-12-01 MED ORDER — SODIUM CHLORIDE 0.9 % IV BOLUS (SEPSIS)
500.0000 mL | Freq: Once | INTRAVENOUS | Status: AC
  Administered 2012-12-01: 500 mL via INTRAVENOUS

## 2012-12-01 MED ORDER — FUROSEMIDE 10 MG/ML IJ SOLN
40.0000 mg | Freq: Once | INTRAMUSCULAR | Status: AC
  Administered 2012-12-01: 40 mg via INTRAVENOUS
  Filled 2012-12-01: qty 4

## 2012-12-01 NOTE — ED Notes (Signed)
Contacted lab about repeat troponin and BMP. Labs are currently running. Pt made aware of plan of care. Family at bedside.

## 2012-12-01 NOTE — ED Notes (Signed)
Per EMS: Pt reports intermittent chest discomfort with radiation to left shoulder and left arm since AM. Mild nausea. Given 325 aspirin. Denies pain at this time. Denies diaphoresis. Hx: Genella Rife. EKG NSR with PAC's.

## 2012-12-01 NOTE — ED Notes (Signed)
Md Lockwood at bedside.  

## 2012-12-01 NOTE — ED Provider Notes (Signed)
CSN: [id number]     Arrival date & time 12/01/12  1117 History   First MD Initiated Contact with Patient 12/01/12 1118     Chief Complaint  Patient presents with  . Chest Pain    HPI  Patient presents after an episode of chest pain that resolved prior to my evaluation. Patient notes that the pain began while essentially at rest.  The pain was focally about the left chest with radiation towards the left shoulder.  There is associated nausea, but no vomiting.  There is no dyspnea, no near syncope/syncope. Pain improved without clear intervention, the patient has taken 2 daily aspirin. Prior to the events the patient was in his usual state of health. He states that the pain is similar to pain he has experienced multiple times in the past, though unusual in the fact that it occurred when upright. Currently the patient has no complaints. Patient denies a history of heart disease, has been evaluated previously. He does endorse a history of polycythemia. He takes no blood thinning medication, does take medication for hypertension.   Past Medical History  Diagnosis Date  . GERD (gastroesophageal reflux disease)   . Hypertension   . Microalbuminuria   . Lumbar back pain   . Hematuria, microscopic   . Hyperglycemia   . Diverticulosis of colon   . Hyperlipidemia   . Gout   . Colon polyp   . GI bleed     secondary to Aspirin  . Polycythemia rubra vera   . Hemorrhoids   . Anal fissure    Past Surgical History  Procedure Laterality Date  . Cataract extraction Bilateral   . Inguinal hernia repair    . Tonsillectomy    . Hemorrhoid surgery    . Sabat    . Irrigation and debridement sebaceous cyst     Family History  Problem Relation Age of Onset  . Colon cancer Mother     died at 6, colorectal  . Heart disease Father 32    MI  . Diabetes Father   . Diabetes Mother   . Stomach cancer Maternal Grandfather    History  Substance Use Topics  . Smoking status: Never Smoker   .  Smokeless tobacco: Never Used  . Alcohol Use: No    Review of Systems  Constitutional:       Per HPI, otherwise negative  HENT:       Per HPI, otherwise negative  Respiratory:       Per HPI, otherwise negative  Cardiovascular:       Per HPI, otherwise negative  Gastrointestinal: Negative for vomiting.  Endocrine:       Negative aside from HPI  Genitourinary:       Neg aside from HPI   Musculoskeletal:       Per HPI, otherwise negative  Skin: Negative.   Neurological: Negative for syncope.    Allergies  Metformin and related  Home Medications   Current Outpatient Rx  Name  Route  Sig  Dispense  Refill  . allopurinol (ZYLOPRIM) 100 MG tablet   Oral   Take 1 tablet (100 mg total) by mouth daily.   90 tablet   3   . amLODipine (NORVASC) 5 MG tablet   Oral   Take 1 tablet (5 mg total) by mouth daily.   90 tablet   3   . azelastine (ASTELIN) 137 MCG/SPRAY nasal spray   Nasal   Place 2 sprays into the nose daily  as needed for allergies.          . clindamycin (CLEOCIN) 150 MG capsule   Oral   Take 150 mg by mouth 4 (four) times daily.         . Coenzyme Q10 300 MG CAPS   Oral   Take by mouth every morning.         . hydrocortisone (ANUSOL-HC) 25 MG suppository   Rectal   Place 1 suppository (25 mg total) rectally 2 (two) times daily as needed for hemorrhoids.   20 suppository   8   . methocarbamol (ROBAXIN) 500 MG tablet   Oral   Take 500 mg by mouth 2 (two) times daily as needed for muscle spasms.         [name] olmesartan (BENICAR) 20 MG tablet   Oral   Take 1 tablet (20 mg total) by mouth daily.   90 tablet   3   . saccharomyces boulardii (FLORASTOR) 250 MG capsule   Oral   Take 250 mg by mouth 2 (two) times daily.         . simvastatin (ZOCOR) 20 MG tablet   Oral   Take 1 tablet (20 mg total) by mouth daily.   90 tablet   3   . triamcinolone (NASACORT AQ) 55 MCG/ACT AERO nasal inhaler   Nasal   Place 2 sprays into the nose daily.    3 Inhaler   3    BP 149/96  Temp(Src) 98.3 F (36.8 C) (Oral)  Resp 14  Ht 6' (1.829 m)  Wt 205 lb (92.987 kg)  BMI 27.80 kg/m2  SpO2 100% Physical Exam  Nursing note and vitals reviewed. Constitutional: He is oriented to person, place, and time. He appears well-developed. No distress.  HENT:  Head: Normocephalic and atraumatic.  Eyes: Conjunctivae and EOM are normal.  Cardiovascular: Normal rate and regular rhythm.   Pulmonary/Chest: Effort normal. No stridor. No respiratory distress.  Abdominal: He exhibits no distension.  Musculoskeletal: He exhibits no edema.  Neurological: He is alert and oriented to person, place, and time. No cranial nerve deficit. He exhibits normal muscle tone. Coordination normal.  Skin: Skin is warm and dry.  Psychiatric: He has a normal mood and affect.    ED Course  Procedures (including critical care time) Labs Review Labs Reviewed  CBC  COMPREHENSIVE METABOLIC PANEL  PROTIME-INR  LIPASE, BLOOD   Imaging Review Dg Chest 2 View  12/01/2012   CLINICAL DATA:  Chest pain  EXAM: CHEST - 2 VIEW  COMPARISON:  None available  FINDINGS: Heart size upper limits normal. The mediastinal contours are within normal limits. Both lungs are clear. Spurring in the mid and lower thoracic spine. No effusion.  IMPRESSION: No acute cardiopulmonary disease.   Electronically Signed   By: Oley Balm M.D.   On: 12/01/2012 11:53    EKG Interpretation    Date/Time:  Sunday December 01 2012 11:20:53 EST Ventricular Rate:  65 PR Interval:  139 QRS Duration: 113 QT Interval:  418 QTC Calculation: 435 R Axis:   85 Text Interpretation:  Sinus rhythm Borderline intraventricular conduction delay Borderline T abnormalities, inferior leads Baseline wander in lead(s) V3 Sinus rhythm Artifact T wave abnormality Abnormal ekg Confirmed by Gerhard Munch  MD [phone]) on 12/01/2012 12:14:31 PM           Cardiac monitor 70 sinus rhythm normal Pulse oximetry 97%  room air normal   4:43 PM Patient has no complaints.  I  reviewed the EMS EKG strips, which the patient was led to believe had a new dysrhythmia.  There is evidence of sinus arrhythmia, but no atrial fibrillation or flutter.  We had a lengthy conversation with multiple family members present about the patient's elevated lipase, and the need for both primary care and cardiology followup tomorrow via telephone.  MDM  No diagnosis found. This patient presents after an episode of chest discomfort, which resolved prior to my evaluation.  Patient also had mild nausea.  On exam the patient is awake and alert, with no ongoing complaints, and throat patient's emergency department course no decompensation, or any new complaints. With elevated potassium level, the patient had medication, and subsequent check demonstrated a normal value.  Patient remained asymptomatic, and was reassuring serial troponins, was discharged to follow up with cardiology in the morning.    Gerhard Munch, MD 12/01/12 [phone]

## 2012-12-02 ENCOUNTER — Encounter: Payer: Self-pay | Admitting: Internal Medicine

## 2012-12-02 ENCOUNTER — Ambulatory Visit (INDEPENDENT_AMBULATORY_CARE_PROVIDER_SITE_OTHER): Payer: Medicare Other | Admitting: Internal Medicine

## 2012-12-02 ENCOUNTER — Ambulatory Visit: Payer: Medicare Other | Admitting: Physical Therapy

## 2012-12-02 VITALS — BP 122/80 | HR 72 | Temp 98.4°F | Ht 72.0 in | Wt 204.0 lb

## 2012-12-02 DIAGNOSIS — R079 Chest pain, unspecified: Secondary | ICD-10-CM | POA: Diagnosis not present

## 2012-12-02 DIAGNOSIS — D45 Polycythemia vera: Secondary | ICD-10-CM | POA: Diagnosis not present

## 2012-12-02 MED ORDER — ESOMEPRAZOLE MAGNESIUM 40 MG PO CPDR: 40.0000 mg | DELAYED_RELEASE_CAPSULE | Freq: Every day | ORAL | Status: DC

## 2012-12-02 MED ORDER — ASPIRIN EC 325 MG PO TBEC: 325.0000 mg | DELAYED_RELEASE_TABLET | Freq: Every day | ORAL | Status: DC

## 2012-12-02 NOTE — Assessment & Plan Note (Signed)
Patient with transient substernal chest pain.  Trop I negative in ER.  I doubt symptoms cardiac in origin but he has multiple risk factors.  Arrange treadmill myoview.  Stop clindamycin.  Restart Nexium 40 mg.  Patient advised to call office if symptoms persist or worsen.  Lab Results  Component Value Date   WBC 8.1 12/01/2012   HGB 16.7 12/01/2012   HCT 48.2 12/01/2012   MCV 90.9 12/01/2012   PLT 214 12/01/2012   Lab Results  Component Value Date   TROPONINI <0.30 12/01/2012

## 2012-12-02 NOTE — Progress Notes (Signed)
Pre visit review using our clinic review tool, if applicable. No additional management support is needed unless otherwise documented below in the visit note. 

## 2012-12-02 NOTE — Assessment & Plan Note (Signed)
Followed by Dr. Myna Hidalgo.  Arrange phlebotomy.  Start Aspirin therapy.

## 2012-12-02 NOTE — Progress Notes (Signed)
Subjective:    Patient ID: Alexander [name], male    DOB: 1940/11/13, 72 y.o.   MRN: [id number]  HPI  72 year old white male with history of hypertension, prediabetes, hyperlipidemia and polycythemia vera for ER follow up.  Patient seen in ED on 11/30 for acute substernal chest pain radiating to left arm.  His symptoms started while he was preparing breakfast.  He describes severe symptoms.  He denies associated shortness of breath or diaphoresis.  He symptoms only lasted 15-30 minutes.    He usually exercises on a regular basis without difficulty.  He was recently seen by dentist and started clindamycin the day before his symptoms started.  His initial blood work in ER showed hyperkalemia and slightly elevated lipase.  He was diuresed with lasix and potassium normalized.   Review of Systems Negative for shortness of breath.  No OTC NSAID use. No melena or hematochezia. No recent illness.    Past Medical History  Diagnosis Date  . GERD (gastroesophageal reflux disease)   . Hypertension   . Microalbuminuria   . Lumbar back pain   . Hematuria, microscopic   . Hyperglycemia   . Diverticulosis of colon   . Hyperlipidemia   . Gout   . Colon polyp   . GI bleed     secondary to Aspirin  . Polycythemia rubra vera   . Hemorrhoids   . Anal fissure     History   Social History  . Marital Status: Married    Spouse Name: N/A    Number of Children: 3  . Years of Education: N/A   Occupational History  . retired Art gallery manager    Social History Main Topics  . Smoking status: Never Smoker   . Smokeless tobacco: Never Used  . Alcohol Use: No  . Drug Use: No  . Sexual Activity: Yes    Partners: Female   Other Topics Concern  . Not on file   Social History Narrative   Daughter, Elease Hashimoto (pediatrician--lives in [place], [place])    Past Surgical History  Procedure Laterality Date  . Cataract extraction Bilateral   . Inguinal hernia repair    . Tonsillectomy    . Hemorrhoid  surgery    . Sabat    . Irrigation and debridement sebaceous cyst      Family History  Problem Relation Age of Onset  . Colon cancer Mother     died at 77, colorectal  . Heart disease Father 54    MI  . Diabetes Father   . Diabetes Mother   . Stomach cancer Maternal Grandfather     Allergies  Allergen Reactions  . Metformin And Related Diarrhea    Current Outpatient Prescriptions on File Prior to Visit  Medication Sig Dispense Refill  . allopurinol (ZYLOPRIM) 100 MG tablet Take 1 tablet (100 mg total) by mouth daily.  90 tablet  3  . amLODipine (NORVASC) 5 MG tablet Take 1 tablet (5 mg total) by mouth daily.  90 tablet  3  . azelastine (ASTELIN) 137 MCG/[name] nasal [name] Place 2 sprays into the nose daily as needed for allergies.       . clindamycin (CLEOCIN) 150 MG capsule Take 150 mg by mouth 4 (four) times daily.      . Coenzyme Q10 300 MG CAPS Take by mouth every morning.      . hydrocortisone (ANUSOL-HC) 25 MG suppository Place 1 suppository (25 mg total) rectally 2 (two) times daily as needed for  hemorrhoids.  20 suppository  8  . methocarbamol (ROBAXIN) 500 MG tablet Take 500 mg by mouth 2 (two) times daily as needed for muscle spasms.      [name] olmesartan (BENICAR) 20 MG tablet Take 1 tablet (20 mg total) by mouth daily.  90 tablet  3  . saccharomyces boulardii (FLORASTOR) 250 MG capsule Take 250 mg by mouth 2 (two) times daily.      . simvastatin (ZOCOR) 20 MG tablet Take 1 tablet (20 mg total) by mouth daily.  90 tablet  3  . triamcinolone (NASACORT AQ) 55 MCG/ACT AERO nasal inhaler Place 2 sprays into the nose daily.  3 Inhaler  3   No current facility-administered medications on file prior to visit.    BP 122/80  Pulse 72  Temp(Src) 98.4 F (36.9 C) (Oral)  Ht 6' (1.829 m)  Wt 204 lb (92.534 kg)  BMI 27.66 kg/m2  EKG reviewed.   Objective:   Physical Exam  Constitutional: He is oriented to person, place, and time. He appears well-developed and  well-nourished.  HENT:  Head: Normocephalic and atraumatic.  Right Ear: External ear normal.  Left Ear: External ear normal.  Mouth/Throat: Oropharynx is clear and moist.  Neck: Neck supple.  Cardiovascular: Normal rate, regular rhythm, normal heart sounds and intact distal pulses.  Exam reveals no friction rub.   No murmur heard. Pulmonary/Chest: Effort normal and breath sounds normal. He has no wheezes.  No chest wall tenderness  Abdominal: Soft. Bowel sounds are normal. He exhibits no distension.  Musculoskeletal: He exhibits no edema.  No calf tenderness  Lymphadenopathy:    He has no cervical adenopathy.  Neurological: He is alert and oriented to person, place, and time. No cranial nerve deficit.  Skin: Skin is warm and dry.  Psychiatric: He has a normal mood and affect. His behavior is normal.          Assessment & Plan:

## 2012-12-03 ENCOUNTER — Telehealth: Payer: Self-pay | Admitting: *Deleted

## 2012-12-03 NOTE — Telephone Encounter (Signed)
Pt notified via mychart

## 2012-12-03 NOTE — Telephone Encounter (Signed)
Message copied by Jacqualyn Posey on Tue Dec 03, 2012 11:42 AM ------      Message from: Meda Coffee      Created: Mon Dec 02, 2012 11:10 PM       Call pt - I suggest he follow up with Dr. Myna Hidalgo before stress test.  I recommend he take aspirin ec 325 mg once daily.        Getting extra phlebotomy may help. ------

## 2012-12-04 ENCOUNTER — Encounter: Payer: Self-pay | Admitting: Hematology & Oncology

## 2012-12-04 ENCOUNTER — Encounter: Payer: Self-pay | Admitting: Internal Medicine

## 2012-12-11 ENCOUNTER — Other Ambulatory Visit (HOSPITAL_BASED_OUTPATIENT_CLINIC_OR_DEPARTMENT_OTHER): Payer: Medicare Other | Admitting: Lab

## 2012-12-11 ENCOUNTER — Ambulatory Visit (HOSPITAL_BASED_OUTPATIENT_CLINIC_OR_DEPARTMENT_OTHER): Payer: Medicare Other

## 2012-12-11 ENCOUNTER — Ambulatory Visit (HOSPITAL_BASED_OUTPATIENT_CLINIC_OR_DEPARTMENT_OTHER): Payer: Medicare Other | Admitting: Hematology & Oncology

## 2012-12-11 VITALS — BP 138/68 | HR 70 | Resp 16

## 2012-12-11 VITALS — BP 119/61 | HR 53 | Temp 98.0°F | Resp 18 | Ht 72.0 in | Wt 207.0 lb

## 2012-12-11 DIAGNOSIS — D45 Polycythemia vera: Secondary | ICD-10-CM

## 2012-12-11 LAB — CBC WITH DIFFERENTIAL (CANCER CENTER ONLY)
BASO%: 0.4 % (ref 0.0–2.0)
EOS%: 3.6 % (ref 0.0–7.0)
HCT: 45.9 % (ref 38.7–49.9)
HGB: 15.4 g/dL (ref 13.0–17.1)
LYMPH#: 2 10*3/uL (ref 0.9–3.3)
LYMPH%: 25.1 % (ref 14.0–48.0)
MCHC: 33.6 g/dL (ref 32.0–35.9)
MCV: 92 fL (ref 82–98)
NEUT%: 54.8 % (ref 40.0–80.0)
Platelets: 195 10*3/uL (ref 145–400)
RDW: 13.4 % (ref 11.1–15.7)

## 2012-12-11 NOTE — Progress Notes (Signed)
This office note has been dictated.

## 2012-12-11 NOTE — Progress Notes (Signed)
Alexander [name] presents today for phlebotomy per MD orders. Phlebotomy procedure started at 1450 and ended at 1505. 500 mls removed. Patient observed for 30 minutes after procedure without any incident. Patient tolerated procedure well. IV needle removed intact.

## 2012-12-11 NOTE — Patient Instructions (Signed)
Therapeutic Phlebotomy Therapeutic phlebotomy is the controlled removal of blood from your body for the purpose of treating a medical condition. It is similar to donating blood. Usually, about a pint (470 mL) of blood is removed. The average adult has 9 to 12 pints (4.3 to 5.7 L) of blood. Therapeutic phlebotomy may be used to treat the following medical conditions:  Hemochromatosis. This is a condition in which there is too much iron in the blood.  Polycythemia vera. This is a condition in which there are too many red cells in the blood.  Porphyria cutanea tarda. This is a disease usually passed from one generation to the next (inherited). It is a condition in which an important part of hemoglobin is not made properly. This results in the build up of abnormal amounts of porphyrins in the body.  Sickle cell disease. This is an inherited disease. It is a condition in which the red blood cells form an abnormal crescent shape rather than a round shape. LET YOUR CAREGIVER KNOW ABOUT:  Allergies.  Medicines taken including herbs, eyedrops, over-the-counter medicines, and creams.  Use of steroids (by mouth or creams).  Previous problems with anesthetics or numbing medicine.  History of blood clots.  History of bleeding or blood problems.  Previous surgery.  Possibility of pregnancy, if this applies. RISKS AND COMPLICATIONS This is a simple and safe procedure. Problems are unlikely. However, problems can occur and may include:  Nausea or lightheadedness.  Low blood pressure.  Soreness, bleeding, swelling, or bruising at the needle insertion site.  Infection. BEFORE THE PROCEDURE  This is a procedure that can be done as an outpatient. Confirm the time that you need to arrive for your procedure. Confirm whether there is a need to fast or withhold any medications. It is helpful to wear clothing with sleeves that can be raised above the elbow. A blood sample may be done to determine the  amount of red blood cells or iron in your blood. Plan ahead of time to have someone drive you home after the procedure. PROCEDURE The entire procedure from preparation through recovery takes about 1 hour. The actual collection takes about 10 to 15 minutes.  A needle will be inserted into your vein.  Tubing and a collection bag will be attached to that needle.  Blood will flow through the needle and tubing into the collection bag.  You may be asked to open and close your hand slowly and continuously during the entire collection.  Once the specified amount of blood has been removed from your body, the collection bag and tubing will be clamped.  The needle will be removed.  Pressure will be held on the site of the needle insertion to stop the bleeding. Then a bandage will be placed over the needle insertion site. AFTER THE PROCEDURE  Your recovery will be assessed and monitored. If there are no problems, as an outpatient, you should be able to go home shortly after the procedure.  Document Released: 05/23/2010 Document Revised: 03/13/2011 Document Reviewed: 05/23/2010 [employer] Patient Information 2014 [employer].  

## 2012-12-12 LAB — FERRITIN CHCC: Ferritin: 63 ng/ml (ref 22–316)

## 2012-12-13 NOTE — Progress Notes (Signed)
DIAGNOSIS:  Polycythemia vera, JAK2-negative.  CURRENT THERAPY: 1. Phlebotomy to maintain hematocrit below 45%. 2. Aspirin 325 mg p.o. daily.  INTERIM HISTORY:  Alexander [name] comes in for followup.  He had a little bit of a chest pain issue a couple weeks ago.  Thankfully, his workup did not show any obvious coronary artery disease.  He was put on 325 mg of aspirin daily.  He is also on some Nexium.  Of note, his wife is doing okay.  She was found to have a locally advanced ductal carcinoma of the right breast.  She underwent double mastectomy.  She is on an aromatase inhibitor now.  She is going for radiation therapy, I think tomorrow.  Alexander [name] has had no nausea or vomiting.  There has been no leg swelling.  He has had no headache.  There have been no fevers, sweats, or chills.  PHYSICAL EXAMINATION:  This is a well developed, well-nourished white gentleman in no obvious distress.  Vital signs:  Temperature of 98, pulse 53, respiratory rate 18, blood pressure 119/61, weight is 207 pounds.  Head and Neck:  Shows normocephalic, atraumatic skull.  There are no ocular or oral lesions.  There are no palpable cervical or supraclavicular lymph nodes.  Lungs:  Clear bilaterally.  Cardiac: Regular rate and rhythm with a normal S1 and S2.  There are no murmurs, rubs, or bruits.  Abdomen:  Soft.  He has good bowel sounds.  There is no fluid wave.  There is no palpable abdominal mass.  There is no palpable hepatosplenomegaly.  Back:  No tenderness over the spine, ribs, or hips.  Extremities:  Show no clubbing, cyanosis or edema.  LABORATORY STUDIES:  White cell count is 8, hemoglobin 15.4, hematocrit 45.9, platelet count 195.  IMPRESSION:  Alexander [name] is a very nice 72 year old gentleman with polycythemia.  Again, I think we have to get his hematocrit down below 45.  This chest pain issue is troublesome to me.  We will go ahead and phlebotomize him today.  I will plan to get him  back in another 6 weeks.  I want to try to see if we can get him through until after his wife has her radiation therapy, so he can help take care of her.    ______________________________ Josph Macho, M.D. PRE/MEDQ  D:  12/11/2012  T:  12/12/2012  Job:  [id number]

## 2012-12-17 ENCOUNTER — Other Ambulatory Visit: Payer: Medicare Other | Admitting: Lab

## 2012-12-17 ENCOUNTER — Ambulatory Visit: Payer: Medicare Other | Admitting: Hematology & Oncology

## 2012-12-19 ENCOUNTER — Ambulatory Visit: Payer: Self-pay | Admitting: Podiatry

## 2012-12-23 ENCOUNTER — Ambulatory Visit (HOSPITAL_COMMUNITY): Payer: Medicare Other | Attending: Internal Medicine | Admitting: Radiology

## 2012-12-23 ENCOUNTER — Encounter: Payer: Self-pay | Admitting: Internal Medicine

## 2012-12-23 VITALS — BP 141/90 | HR 56 | Ht 71.0 in | Wt 206.0 lb

## 2012-12-23 DIAGNOSIS — Z8249 Family history of ischemic heart disease and other diseases of the circulatory system: Secondary | ICD-10-CM | POA: Diagnosis not present

## 2012-12-23 DIAGNOSIS — I10 Essential (primary) hypertension: Secondary | ICD-10-CM | POA: Insufficient documentation

## 2012-12-23 DIAGNOSIS — R079 Chest pain, unspecified: Secondary | ICD-10-CM | POA: Diagnosis not present

## 2012-12-23 MED ORDER — TECHNETIUM TC 99M SESTAMIBI GENERIC - CARDIOLITE
11.0000 | Freq: Once | INTRAVENOUS | Status: AC | PRN
  Administered 2012-12-23: 11 via INTRAVENOUS

## 2012-12-23 MED ORDER — TECHNETIUM TC 99M SESTAMIBI GENERIC - CARDIOLITE
33.0000 | Freq: Once | INTRAVENOUS | Status: AC | PRN
  Administered 2012-12-23: 33 via INTRAVENOUS

## 2012-12-23 NOTE — Progress Notes (Signed)
  [facility] SITE 3 NUCLEAR MED [address] [place], [place] [address] [phone]    Cardiology Nuclear Med Study  Alexander [name] is a 72 y.o. male     MRN : [id number]     DOB: Jun 07, 1940  Procedure Date: 12/23/2012  Nuclear Med Background Indication for Stress Test:  Evaluation for Ischemia History:  No known CAD, Echo 2010 EF 60-65% Cardiac Risk Factors: Family History - CAD, Hypertension and Lipids  Symptoms:  Chest Pain (last date of chest discomfort was 2-3 weeks ago)   Nuclear Pre-Procedure Caffeine/Decaff Intake:  8:30pm NPO After: 7:30am   Lungs:  clear O2 Sat: 97% on room air. IV 0.9% NS with Angio Cath:  20g  IV Site: R Wrist  IV Started by:  Cathlyn Parsons, RN  Chest Size (in):  46 Cup Size: n/a  Height: 5\' 11"  (1.803 m)  Weight:  206 lb (93.441 kg)  BMI:  Body mass index is 28.74 kg/(m^2). Tech Comments:  n/a    Nuclear Med Study 1 or 2 day study: 1 day  Stress Test Type:  Stress  Reading MD: Cassell Clement, MD  Order Authorizing Provider:  Evelene Croon  Resting Radionuclide: Technetium 56m Sestamibi  Resting Radionuclide Dose: 11.0 mCi   Stress Radionuclide:  Technetium 1m Sestamibi  Stress Radionuclide Dose: 33.0 mCi           Stress Protocol Rest HR: 56 Stress HR: 136  Rest BP: 141/90 Stress BP: 162/73  Exercise Time (min): 8:30 METS: 10.4           Dose of Adenosine (mg):  n/a Dose of Lexiscan: n/a mg  Dose of Atropine (mg): n/a Dose of Dobutamine: n/a mcg/kg/min (at max HR)  Stress Test Technologist: Nelson Chimes, BS-ES  Nuclear Technologist:  Harlow Asa, CNMT     Rest Procedure:  Myocardial perfusion imaging was performed at rest 45 minutes following the intravenous administration of Technetium 44m Sestamibi. Rest ECG: NSR - Normal EKG  Stress Procedure:  The patient exercised on the treadmill utilizing the Bruce Protocol for 8:30 minutes. The patient stopped due to fatigue and denied any chest pain.   Technetium 22m Sestamibi was injected at peak exercise and myocardial perfusion imaging was performed after a brief delay. Stress ECG: No significant change from baseline ECG  QPS Raw Data Images:  Normal; no motion artifact; normal heart/lung ratio. Stress Images:  Normal homogeneous uptake in all areas of the myocardium. Rest Images:  Normal homogeneous uptake in all areas of the myocardium. Subtraction (SDS):  No evidence of ischemia. Transient Ischemic Dilatation (Normal <1.22):  0.86 Lung/Heart Ratio (Normal <0.45):  0.45  Quantitative Gated Spect Images QGS EDV:  94 ml QGS ESV:  38 ml  Impression Exercise Capacity:  Good exercise capacity. BP Response:  Normal blood pressure response. Clinical Symptoms:  No significant symptoms noted. ECG Impression:  No significant ST segment change suggestive of ischemia. Comparison with Prior Nuclear Study: No previous nuclear study performed  Overall Impression:  Normal stress nuclear study.  LV Ejection Fraction: 59%.  LV Wall Motion:  NL LV Function; NL Wall Motion   [employer]

## 2012-12-24 ENCOUNTER — Ambulatory Visit: Payer: Self-pay | Admitting: Podiatry

## 2012-12-27 ENCOUNTER — Encounter: Payer: Self-pay | Admitting: Internal Medicine

## 2013-01-06 DIAGNOSIS — H109 Unspecified conjunctivitis: Secondary | ICD-10-CM | POA: Diagnosis not present

## 2013-01-07 ENCOUNTER — Encounter: Payer: Self-pay | Admitting: Podiatry

## 2013-01-07 ENCOUNTER — Ambulatory Visit (INDEPENDENT_AMBULATORY_CARE_PROVIDER_SITE_OTHER): Payer: Medicare Other | Admitting: Podiatry

## 2013-01-07 VITALS — BP 130/66 | HR 61 | Resp 16

## 2013-01-07 DIAGNOSIS — B351 Tinea unguium: Secondary | ICD-10-CM | POA: Diagnosis not present

## 2013-01-07 DIAGNOSIS — M79609 Pain in unspecified limb: Secondary | ICD-10-CM

## 2013-01-07 MED ORDER — EFINACONAZOLE 10 % EX SOLN: 1.0000 [drp] | Freq: Every day | CUTANEOUS | Status: DC

## 2013-01-07 MED ORDER — EFINACONAZOLE 10 % EX SOLN
1.0000 [drp] | Freq: Every day | CUTANEOUS | Status: DC
Start: 2013-01-07 — End: 2013-01-07

## 2013-01-07 NOTE — Progress Notes (Signed)
Alexander [name] presents today with a chief complaint of painful he thick mycotic nails to bother him manipulation with shoe gear is.  Objective: Vital signs are stable he is alert and oriented x3. Nails are thick yellow dystrophic onychomycotic bilateral and painful palpation. Pulses are strongly palpable bilateral.  Assessment: Pain in limb secondary to onychomycosis 1 through 5 bilateral.  Plan: Debridement of nails 1 through 5 bilateral is cover service secondary to pain we also prescribed Jubliea. I will followup with him in 3 months.

## 2013-01-10 ENCOUNTER — Encounter: Payer: Self-pay | Admitting: Internal Medicine

## 2013-01-15 ENCOUNTER — Telehealth: Payer: Self-pay | Admitting: Internal Medicine

## 2013-01-15 NOTE — Telephone Encounter (Signed)
Pt would like cindy to return his call concerning DR hopper is retiring and he would like to recommend his friend to see dr Shawna Orleans. His friend is seeing dr Linna Darner now.

## 2013-01-16 NOTE — Telephone Encounter (Signed)
Told Alexander [name] that he needs to have his friend contact us and request to switch to Dr Shawna Orleans.  Dr Shawna Orleans will review chart then.  Alexander [name] will let his friend know

## 2013-01-29 ENCOUNTER — Encounter: Payer: Self-pay | Admitting: Hematology & Oncology

## 2013-01-29 ENCOUNTER — Ambulatory Visit (HOSPITAL_BASED_OUTPATIENT_CLINIC_OR_DEPARTMENT_OTHER): Payer: Medicare Other | Admitting: Hematology & Oncology

## 2013-01-29 ENCOUNTER — Other Ambulatory Visit (HOSPITAL_BASED_OUTPATIENT_CLINIC_OR_DEPARTMENT_OTHER): Payer: Medicare Other | Admitting: Lab

## 2013-01-29 ENCOUNTER — Ambulatory Visit: Payer: Medicare Other

## 2013-01-29 VITALS — BP 135/76 | HR 50 | Temp 97.4°F | Resp 18 | Ht 74.0 in | Wt 211.0 lb

## 2013-01-29 DIAGNOSIS — D45 Polycythemia vera: Secondary | ICD-10-CM

## 2013-01-29 DIAGNOSIS — R7309 Other abnormal glucose: Secondary | ICD-10-CM | POA: Diagnosis not present

## 2013-01-29 DIAGNOSIS — I10 Essential (primary) hypertension: Secondary | ICD-10-CM | POA: Diagnosis not present

## 2013-01-29 LAB — CBC WITH DIFFERENTIAL (CANCER CENTER ONLY)
BASO#: 0.1 10*3/uL (ref 0.0–0.2)
BASO%: 0.6 % (ref 0.0–2.0)
EOS ABS: 0.5 10*3/uL (ref 0.0–0.5)
EOS%: 6.6 % (ref 0.0–7.0)
HEMATOCRIT: 44.8 % (ref 38.7–49.9)
HEMOGLOBIN: 14.6 g/dL (ref 13.0–17.1)
LYMPH#: 2.2 10*3/uL (ref 0.9–3.3)
LYMPH%: 27.2 % (ref 14.0–48.0)
MCH: 30.3 pg (ref 28.0–33.4)
MCHC: 32.6 g/dL (ref 32.0–35.9)
MCV: 93 fL (ref 82–98)
MONO#: 1.3 10*3/uL — AB (ref 0.1–0.9)
MONO%: 16.1 % — ABNORMAL HIGH (ref 0.0–13.0)
NEUT#: 4.1 10*3/uL (ref 1.5–6.5)
NEUT%: 49.5 % (ref 40.0–80.0)
Platelets: 200 10*3/uL (ref 145–400)
RBC: 4.82 10*6/uL (ref 4.20–5.70)
RDW: 13.2 % (ref 11.1–15.7)
WBC: 8.2 10*3/uL (ref 4.0–10.0)

## 2013-01-29 LAB — FERRITIN CHCC: Ferritin: 22 ng/ml (ref 22–316)

## 2013-01-29 LAB — CHCC SATELLITE - SMEAR

## 2013-01-29 NOTE — Progress Notes (Signed)
This office note has been dictated.

## 2013-01-29 NOTE — Progress Notes (Signed)
No phlebotomy needed today per M.D order.

## 2013-01-30 ENCOUNTER — Other Ambulatory Visit: Payer: BLUE CROSS/BLUE SHIELD

## 2013-01-30 ENCOUNTER — Other Ambulatory Visit: Payer: Medicare Other | Admitting: Lab

## 2013-01-30 ENCOUNTER — Ambulatory Visit: Payer: Medicare Other | Admitting: Hematology & Oncology

## 2013-01-30 NOTE — Progress Notes (Signed)
DIAGNOSIS:  Polycythemia vera -- JAK2 negative.  CURRENT THERAPY: 1. Phlebotomy to maintain hematocrit below 45%. 2. Aspirin 81 mg p.o. daily.  INTERIM HISTORY:  Mr. Schappell comes in for his followup.  He is feeling great.  He apparently had some chest pain issues back in December.  He had a complete cardiac workup.  Thankfully, no cardiac issues were found.  He is currently on aspirin 325 mg a day, on Nexium also.  I think that he probably can go back to 81 mg a day of aspirin.  This will be beneficial with the polycythemia. I also told him if he could stop the Nexium and go on to Zantac, which he was taking before.  He has had no cough.  He has had no headache.  There has been no nausea or vomiting.  He has had no change in bowel or bladder habits.  He has not noted any leg swelling.  He has had no rashes.  Overall, his performance status is [name].  PHYSICAL EXAMINATION:  General:  This is a well developed, well- nourished white gentleman in no obvious distress.  Vital Signs: Temperature of 97.4, pulse 50, respiratory rate 18, blood pressure 135/76.  Weight is 211 pounds.  Head and Neck:  Normocephalic, atraumatic skull.  There are no ocular or oral lesions.  There are no palpable cervical or supraclavicular lymph nodes.  Lungs:  Clear bilaterally.  There are no rales, wheezes, or rhonchi.  Cardiac: Regular rate and rhythm with a normal S1, S2.  There are no murmurs, rubs, or bruits.  Abdomen:  Soft.  He has good bowel sounds.  There is no fluid wave.  There is no palpable abdominal mass.  There is no palpable hepatosplenomegaly.  Back:  No tenderness over the spine, ribs, or hips.  Extremities:  Show no clubbing, cyanosis, or edema. Neurological:  Shows no focal neurological deficit.  LABORATORY STUDIES:  White cell count is 8.2, hemoglobin 14.6, hematocrit 44.8, platelet count 200,000.  MCV is 93.  IMPRESSION:  Mr. Hommes is a really very nice 73 year old gentleman. He  has polycythemia.  We are keeping his hematocrit below 45%.  For now, we can hold off on phlebotomizing him.  I think we can probably go 2 months before we have to see him back.  Of note, his wife will complete radiation therapy for her breast cancer. She has done incredibly well with this.  I will see Mr. Barrack back in a couple of months.    ______________________________ [name], M.D. PRE/MEDQ  D:  01/29/2013  T:  01/30/2013  Job:  [id number]

## 2013-02-05 ENCOUNTER — Ambulatory Visit (INDEPENDENT_AMBULATORY_CARE_PROVIDER_SITE_OTHER): Payer: Medicare Other | Admitting: Internal Medicine

## 2013-02-05 ENCOUNTER — Encounter: Payer: Self-pay | Admitting: Internal Medicine

## 2013-02-05 VITALS — BP 110/84 | Temp 101.7°F | Ht 74.0 in | Wt 207.0 lb

## 2013-02-05 DIAGNOSIS — J111 Influenza due to unidentified influenza virus with other respiratory manifestations: Secondary | ICD-10-CM | POA: Diagnosis not present

## 2013-02-05 DIAGNOSIS — R509 Fever, unspecified: Secondary | ICD-10-CM | POA: Diagnosis not present

## 2013-02-05 DIAGNOSIS — IMO0001 Reserved for inherently not codable concepts without codable children: Secondary | ICD-10-CM

## 2013-02-05 DIAGNOSIS — M791 Myalgia, unspecified site: Secondary | ICD-10-CM

## 2013-02-05 DIAGNOSIS — R6883 Chills (without fever): Secondary | ICD-10-CM

## 2013-02-05 LAB — POCT INFLUENZA A/B
INFLUENZA A, POC: NEGATIVE
Influenza B, POC: NEGATIVE

## 2013-02-05 MED ORDER — OSELTAMIVIR PHOSPHATE 75 MG PO CAPS: 75.0000 mg | ORAL_CAPSULE | Freq: Two times a day (BID) | ORAL | Status: DC

## 2013-02-05 NOTE — Assessment & Plan Note (Addendum)
73 year old white male with signs symptoms of acute influenza. Treat with Tamiflu 75 mg twice daily for 5 days. Patient advised to use acetaminophen for fever. Also increase fluid intake.  Patient advised to call office if symptoms persist or worsen.  Patient understands possible risk of delirium with use of Tamiflu.  His wife [name] recently diagnosed and treated for stage III breast cancer.  His wife provided with prophylactic dose of Tamiflu 75 mg once daily for 10 days.

## 2013-02-05 NOTE — Progress Notes (Signed)
Subjective:    Patient ID: Alexander [name], male    DOB: 15-Jul-1940, 73 y.o.   MRN: [id number]  HPI  73 year old white male with history of hypertension and polycythemia vera complains of flu like symptoms. He reports his nephew came to visit him from [place] one week ago. Soon after arriving, his nephew was diagnosed with acute influenza.  Patient symptoms started on Monday. He complains of fever, myalgias and significant fatigue. He has upper respiratory congestion but denies cough or shortness of breath.  Review of Systems Fever, negative for cough or shortness of breath.  Poor appetite. Past Medical History  Diagnosis Date  . GERD (gastroesophageal reflux disease)   . Hypertension   . Microalbuminuria   . Lumbar back pain   . Hematuria, microscopic   . Hyperglycemia   . Diverticulosis of colon   . Hyperlipidemia   . Gout   . Colon polyp   . GI bleed     secondary to Aspirin  . Polycythemia rubra vera   . Hemorrhoids   . Anal fissure     History   Social History  . Marital Status: Married    Spouse Name: N/A    Number of Children: 3  . Years of Education: N/A   Occupational History  . retired Chief Financial Officer    Social History Main Topics  . Smoking status: Never Smoker   . Smokeless tobacco: Never Used  . Alcohol Use: No  . Drug Use: No  . Sexual Activity: Yes    Partners: Female   Other Topics Concern  . Not on file   Social History Narrative   Daughter, [name] (pediatrician--lives in [place], [place])    Past Surgical History  Procedure Laterality Date  . Cataract extraction Bilateral   . Inguinal hernia repair    . Tonsillectomy    . Hemorrhoid surgery    . Sabat    . Irrigation and debridement sebaceous cyst      Family History  Problem Relation Age of Onset  . Colon cancer Mother     died at 83, colorectal  . Heart disease Father 87    MI  . Diabetes Father   . Diabetes Mother   . Stomach cancer Maternal Grandfather     Allergies    Allergen Reactions  . Metformin And Related Diarrhea    Current Outpatient Prescriptions on File Prior to Visit  Medication Sig Dispense Refill  . Alcaftadine (LASTACAFT) 0.25 % SOLN Apply to eye every morning.      [name] allopurinol (ZYLOPRIM) 100 MG tablet Take 1 tablet (100 mg total) by mouth daily.  90 tablet  3  . amLODipine (NORVASC) 5 MG tablet Take 1 tablet (5 mg total) by mouth daily.  90 tablet  3  . aspirin EC 325 MG tablet Take 1 tablet (325 mg total) by mouth daily.  100 tablet  0  . Coenzyme Q10 300 MG CAPS Take by mouth every morning.      . Efinaconazole (JUBLIA) 10 % SOLN Apply 1 drop topically daily.  1 Bottle  11  . esomeprazole (NEXIUM) 40 MG capsule Take 1 capsule (40 mg total) by mouth daily.  90 capsule  3  . hydrocortisone (ANUSOL-HC) 25 MG suppository Place 25 mg rectally as needed for hemorrhoids.      . methocarbamol (ROBAXIN) 500 MG tablet Take 500 mg by mouth 2 (two) times daily as needed for muscle spasms.      [name] olmesartan (BENICAR) 20  MG tablet Take 1 tablet (20 mg total) by mouth daily.  90 tablet  3  . saccharomyces boulardii (FLORASTOR) 250 MG capsule Take 250 mg by mouth 2 (two) times daily.      . simvastatin (ZOCOR) 20 MG tablet Take 1 tablet (20 mg total) by mouth daily.  90 tablet  3  . Wheat Dextrin (BENEFIBER DRINK MIX PO) Take by mouth every morning.       No current facility-administered medications on file prior to visit.    BP 110/84  Temp(Src) 101.7 F (38.7 C) (Oral)  Ht 6\' 2"  (1.88 m)  Wt 207 lb (93.895 kg)  BMI 26.57 kg/m2       Objective:   Physical Exam  Constitutional: He is oriented to person, place, and time. He appears well-developed and well-nourished. No distress.  Cardiovascular: Normal rate, regular rhythm and normal heart sounds.   No murmur heard. Pulmonary/Chest: Effort normal and breath sounds normal. He has no wheezes.  Neurological: He is alert and oriented to person, place, and time. No cranial nerve deficit.   Psychiatric: He has a normal mood and affect. His behavior is normal.       Assessment & Plan:

## 2013-02-05 NOTE — Patient Instructions (Signed)
Increase fluid intake and take Tamiflu as directed.  Use acetaminophen 325 mg - 2 tabs twice daily as needed. Please contact our office if your symptoms do not improve or gets worse.

## 2013-02-05 NOTE — Progress Notes (Signed)
Pre visit review using our clinic review tool, if applicable. No additional management support is needed unless otherwise documented below in the visit note. 

## 2013-02-07 ENCOUNTER — Ambulatory Visit: Payer: Medicare Other | Admitting: Internal Medicine

## 2013-02-21 ENCOUNTER — Encounter: Payer: Self-pay | Admitting: Internal Medicine

## 2013-03-04 DIAGNOSIS — H16229 Keratoconjunctivitis sicca, not specified as Sjogren's, unspecified eye: Secondary | ICD-10-CM | POA: Diagnosis not present

## 2013-03-25 ENCOUNTER — Ambulatory Visit (HOSPITAL_BASED_OUTPATIENT_CLINIC_OR_DEPARTMENT_OTHER): Payer: Medicare Other

## 2013-03-25 ENCOUNTER — Encounter: Payer: Self-pay | Admitting: Hematology & Oncology

## 2013-03-25 ENCOUNTER — Ambulatory Visit (HOSPITAL_BASED_OUTPATIENT_CLINIC_OR_DEPARTMENT_OTHER): Payer: Medicare Other | Admitting: Hematology & Oncology

## 2013-03-25 ENCOUNTER — Other Ambulatory Visit (HOSPITAL_BASED_OUTPATIENT_CLINIC_OR_DEPARTMENT_OTHER): Payer: Medicare Other | Admitting: Lab

## 2013-03-25 VITALS — BP 132/67 | HR 64 | Temp 97.5°F | Resp 18 | Ht 69.0 in | Wt 211.0 lb

## 2013-03-25 VITALS — BP 138/83 | HR 52 | Resp 20

## 2013-03-25 DIAGNOSIS — D45 Polycythemia vera: Secondary | ICD-10-CM

## 2013-03-25 LAB — CBC WITH DIFFERENTIAL (CANCER CENTER ONLY)
BASO#: 0 10*3/uL (ref 0.0–0.2)
BASO%: 0.5 % (ref 0.0–2.0)
EOS ABS: 0.4 10*3/uL (ref 0.0–0.5)
EOS%: 5.7 % (ref 0.0–7.0)
HCT: 46.4 % (ref 38.7–49.9)
HGB: 15.4 g/dL (ref 13.0–17.1)
LYMPH#: 1.7 10*3/uL (ref 0.9–3.3)
LYMPH%: 22.3 % (ref 14.0–48.0)
MCH: 30.2 pg (ref 28.0–33.4)
MCHC: 33.2 g/dL (ref 32.0–35.9)
MCV: 91 fL (ref 82–98)
MONO#: 1.2 10*3/uL — ABNORMAL HIGH (ref 0.1–0.9)
MONO%: 16.6 % — AB (ref 0.0–13.0)
NEUT%: 54.9 % (ref 40.0–80.0)
NEUTROS ABS: 4.1 10*3/uL (ref 1.5–6.5)
PLATELETS: 212 10*3/uL (ref 145–400)
RBC: 5.1 10*6/uL (ref 4.20–5.70)
RDW: 14 % (ref 11.1–15.7)
WBC: 7.5 10*3/uL (ref 4.0–10.0)

## 2013-03-25 NOTE — Progress Notes (Signed)
Alexander [name] presents today for phlebotomy per MD orders. Phlebotomy procedure started at 0955 and ended at 1005. Approximately 500 mls removed. Patient observed for 30 minutes after procedure without any incident. Patient tolerated procedure well. IV needle removed intact.

## 2013-03-25 NOTE — Patient Instructions (Signed)

## 2013-03-25 NOTE — Progress Notes (Signed)
Hematology and Oncology Follow Up Visit  Alexander [name] [id number] 1940/03/23 73 y.o. 03/25/2013   Principle Diagnosis:   Polycythemia vera-JAK2 negative  Current Therapy:    Phlebotomy to maintain hematocrit below 45%  Aspirin 325 mg by mouth daily     Interim History:  Alexander [name] is back for followup. He's doing well. Only last saw him in January, there is some cardiac problems that he had. Get a complete workup. This was all negative. However, his doctor put him on 325 mg of aspirin.  He and his wife are planning to go out of town. They will would be going to [place] today. They will be thing going to [place] in April.  As always, he's been active. He's had no problems with cough or shortness of breath. There is no abdominal pain. There is no change in bowel or bladder habits. He's had no headache.  He is iron deficient. His last ferritin was 22 back in January. Medications: Current outpatient prescriptions:Alcaftadine (LASTACAFT) 0.25 % SOLN, Apply to eye every morning., Disp: , Rfl: ;  allopurinol (ZYLOPRIM) 100 MG tablet, Take 1 tablet (100 mg total) by mouth daily., Disp: 90 tablet, Rfl: 3;  amLODipine (NORVASC) 5 MG tablet, Take 1 tablet (5 mg total) by mouth daily., Disp: 90 tablet, Rfl: 3;  aspirin EC 325 MG tablet, Take 1 tablet (325 mg total) by mouth daily., Disp: 100 tablet, Rfl: 0 Coenzyme Q10 300 MG CAPS, Take by mouth every morning., Disp: , Rfl: ;  Efinaconazole (JUBLIA) 10 % SOLN, Apply 1 drop topically daily., Disp: 1 Bottle, Rfl: 11;  esomeprazole (NEXIUM) 40 MG capsule, Take 1 capsule (40 mg total) by mouth daily., Disp: 90 capsule, Rfl: 3;  hydrocortisone (ANUSOL-HC) 25 MG suppository, Place 25 mg rectally as needed for hemorrhoids., Disp: , Rfl:  methocarbamol (ROBAXIN) 500 MG tablet, Take 500 mg by mouth 2 (two) times daily as needed for muscle spasms., Disp: , Rfl: ;  olmesartan (BENICAR) 20 MG tablet, Take 1 tablet (20 mg total) by mouth daily., Disp: 90  tablet, Rfl: 3;  oseltamivir (TAMIFLU) 75 MG capsule, Take 1 capsule (75 mg total) by mouth 2 (two) times daily., Disp: 10 capsule, Rfl: 0 saccharomyces boulardii (FLORASTOR) 250 MG capsule, Take 250 mg by mouth 2 (two) times daily., Disp: , Rfl: ;  simvastatin (ZOCOR) 20 MG tablet, Take 1 tablet (20 mg total) by mouth daily., Disp: 90 tablet, Rfl: 3;  Wheat Dextrin (BENEFIBER DRINK MIX PO), Take by mouth every morning., Disp: , Rfl:   Allergies:  Allergies  Allergen Reactions  . Metformin And Related Diarrhea    Past Medical History, Surgical history, Social history, and Family History were reviewed and updated.  Review of Systems: As above  Physical Exam:  height is 5\' 9"  (1.753 m) and weight is 211 lb (95.709 kg). His oral temperature is 97.5 F (36.4 C). His blood pressure is 132/67 and his pulse is 64. His respiration is 18.   Well-developed well-nourished gentleman. Lungs are clear. Cardiac exam regular rate and rhythm abdomen soft. He is no palpable liver or spleen tip. Back exam no tenderness over the spine ribs or hips. Extremities no clubbing cyanosis or edema. Skin exam no rashes. Neurological exam no focal deficits.  Lab Results  Component Value Date   WBC 7.5 03/25/2013   HGB 15.4 03/25/2013   HCT 46.4 03/25/2013   MCV 91 03/25/2013   PLT 212 03/25/2013     Chemistry  Component Value Date/Time   NA 137 12/01/2012 1506   K 4.3 12/01/2012 1506   CL 101 12/01/2012 1506   CO2 29 12/01/2012 1506   BUN 22 12/01/2012 1506   CREATININE 1.28 12/01/2012 1506   CREATININE 1.19 08/09/2010 1159      Component Value Date/Time   CALCIUM 9.4 12/01/2012 1506   ALKPHOS 85 12/01/2012 1207   AST 20 12/01/2012 1207   ALT 21 12/01/2012 1207   BILITOT 0.7 12/01/2012 1207         Impression and Plan: Alexander [name] is 73 year old gentleman. He has polycythemia. We are phlebotomizing him. He'll be phlebotomized today.  We'll plan for followup in 3 months. This typically will be  good enough for him. I think that he may need to be phlebotomized then.   [name], MD 3/24/20159:49 AM

## 2013-03-26 ENCOUNTER — Other Ambulatory Visit: Payer: BLUE CROSS/BLUE SHIELD | Admitting: Lab

## 2013-03-26 ENCOUNTER — Ambulatory Visit: Payer: BLUE CROSS/BLUE SHIELD | Admitting: Hematology & Oncology

## 2013-04-08 ENCOUNTER — Encounter: Payer: Self-pay | Admitting: Podiatry

## 2013-04-08 ENCOUNTER — Ambulatory Visit (INDEPENDENT_AMBULATORY_CARE_PROVIDER_SITE_OTHER): Payer: Medicare Other | Admitting: Podiatry

## 2013-04-08 VITALS — BP 138/80 | HR 60 | Resp 16 | Ht 71.0 in | Wt 204.0 lb

## 2013-04-08 DIAGNOSIS — M79609 Pain in unspecified limb: Secondary | ICD-10-CM | POA: Diagnosis not present

## 2013-04-08 DIAGNOSIS — B351 Tinea unguium: Secondary | ICD-10-CM

## 2013-04-08 NOTE — Progress Notes (Signed)
He presents today with a chief complaint of painful elongated toenails.  Objective: Vital signs are stable he is alert and oriented x3. Pulses are palpable bilateral. Nails are thick yellow dystrophic onychomycotic and painful palpation.  Assessment: Pain in limb secondary to onychomycosis 1 through 5 bilateral.  Plan: Debridement of nails 1 through 5 bilateral.

## 2013-04-11 ENCOUNTER — Telehealth: Payer: Self-pay | Admitting: Internal Medicine

## 2013-04-11 NOTE — Telephone Encounter (Signed)
Pt made his 6 mo fup and would like to know if he needs labs prior to his appt on 8/5?

## 2013-04-14 NOTE — Telephone Encounter (Signed)
appt scheduled for pt for labs

## 2013-04-14 NOTE — Telephone Encounter (Signed)
lmom for pt to cb

## 2013-04-14 NOTE — Telephone Encounter (Signed)
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

## 2013-05-12 ENCOUNTER — Encounter: Payer: Self-pay | Admitting: Hematology & Oncology

## 2013-05-14 ENCOUNTER — Other Ambulatory Visit: Payer: Self-pay | Admitting: *Deleted

## 2013-05-14 MED ORDER — TRIAMCINOLONE ACETONIDE 0.5 % EX CREA: 1.0000 "application " | TOPICAL_CREAM | Freq: Two times a day (BID) | CUTANEOUS | Status: DC

## 2013-05-18 ENCOUNTER — Other Ambulatory Visit: Payer: Self-pay | Admitting: Internal Medicine

## 2013-05-20 ENCOUNTER — Encounter (HOSPITAL_COMMUNITY): Payer: Self-pay | Admitting: Emergency Medicine

## 2013-05-20 ENCOUNTER — Emergency Department (HOSPITAL_COMMUNITY): Payer: Medicare Other

## 2013-05-20 ENCOUNTER — Encounter (HOSPITAL_COMMUNITY)
Admission: EM | Disposition: A | Discharge status: Home / Self Care | Payer: Self-pay | Source: Home / Self Care | Attending: Cardiology

## 2013-05-20 ENCOUNTER — Observation Stay (HOSPITAL_COMMUNITY)
Admission: EM | Admit: 2013-05-20 | Discharge: 2013-05-21 | Disposition: A | Discharge status: Home / Self Care | Payer: Medicare Other | Attending: Cardiology | Admitting: Cardiology

## 2013-05-20 DIAGNOSIS — M109 Gout, unspecified: Secondary | ICD-10-CM | POA: Insufficient documentation

## 2013-05-20 DIAGNOSIS — I7789 Other specified disorders of arteries and arterioles: Secondary | ICD-10-CM

## 2013-05-20 DIAGNOSIS — E785 Hyperlipidemia, unspecified: Secondary | ICD-10-CM

## 2013-05-20 DIAGNOSIS — I2510 Atherosclerotic heart disease of native coronary artery without angina pectoris: Principal | ICD-10-CM | POA: Insufficient documentation

## 2013-05-20 DIAGNOSIS — I200 Unstable angina: Secondary | ICD-10-CM | POA: Diagnosis not present

## 2013-05-20 DIAGNOSIS — I10 Essential (primary) hypertension: Secondary | ICD-10-CM

## 2013-05-20 DIAGNOSIS — R9431 Abnormal electrocardiogram [ECG] [EKG]: Secondary | ICD-10-CM

## 2013-05-20 DIAGNOSIS — I208 Other forms of angina pectoris: Secondary | ICD-10-CM | POA: Diagnosis present

## 2013-05-20 DIAGNOSIS — R911 Solitary pulmonary nodule: Secondary | ICD-10-CM

## 2013-05-20 DIAGNOSIS — M545 Low back pain, unspecified: Secondary | ICD-10-CM | POA: Insufficient documentation

## 2013-05-20 DIAGNOSIS — Z7982 Long term (current) use of aspirin: Secondary | ICD-10-CM | POA: Insufficient documentation

## 2013-05-20 DIAGNOSIS — D45 Polycythemia vera: Secondary | ICD-10-CM

## 2013-05-20 DIAGNOSIS — R0789 Other chest pain: Secondary | ICD-10-CM | POA: Diagnosis not present

## 2013-05-20 DIAGNOSIS — E663 Overweight: Secondary | ICD-10-CM | POA: Insufficient documentation

## 2013-05-20 DIAGNOSIS — R918 Other nonspecific abnormal finding of lung field: Secondary | ICD-10-CM | POA: Diagnosis not present

## 2013-05-20 DIAGNOSIS — R3129 Other microscopic hematuria: Secondary | ICD-10-CM | POA: Diagnosis not present

## 2013-05-20 DIAGNOSIS — G8929 Other chronic pain: Secondary | ICD-10-CM | POA: Diagnosis not present

## 2013-05-20 DIAGNOSIS — K5730 Diverticulosis of large intestine without perforation or abscess without bleeding: Secondary | ICD-10-CM | POA: Diagnosis not present

## 2013-05-20 DIAGNOSIS — I2089 Other forms of angina pectoris: Secondary | ICD-10-CM | POA: Diagnosis present

## 2013-05-20 DIAGNOSIS — K219 Gastro-esophageal reflux disease without esophagitis: Secondary | ICD-10-CM | POA: Diagnosis not present

## 2013-05-20 DIAGNOSIS — E669 Obesity, unspecified: Secondary | ICD-10-CM

## 2013-05-20 DIAGNOSIS — Z6828 Body mass index (BMI) 28.0-28.9, adult: Secondary | ICD-10-CM | POA: Diagnosis not present

## 2013-05-20 DIAGNOSIS — R079 Chest pain, unspecified: Secondary | ICD-10-CM | POA: Diagnosis not present

## 2013-05-20 HISTORY — DX: Overweight: E66.3

## 2013-05-20 HISTORY — PX: CARDIAC CATHETERIZATION: SHX172

## 2013-05-20 HISTORY — PX: LEFT HEART CATHETERIZATION WITH CORONARY ANGIOGRAM: SHX5451

## 2013-05-20 HISTORY — DX: Unspecified osteoarthritis, unspecified site: M19.90

## 2013-05-20 HISTORY — DX: Atherosclerotic heart disease of native coronary artery without angina pectoris: I25.10

## 2013-05-20 LAB — COMPREHENSIVE METABOLIC PANEL
ALK PHOS: 80 U/L (ref 39–117)
ALT: 19 U/L (ref 0–53)
AST: 16 U/L (ref 0–37)
Albumin: 3.5 g/dL (ref 3.5–5.2)
BILIRUBIN TOTAL: 0.7 mg/dL (ref 0.3–1.2)
BUN: 21 mg/dL (ref 6–23)
CHLORIDE: 105 meq/L (ref 96–112)
CO2: 25 meq/L (ref 19–32)
Calcium: 9 mg/dL (ref 8.4–10.5)
Creatinine, Ser: 1.27 mg/dL (ref 0.50–1.35)
GFR, EST AFRICAN AMERICAN: 63 mL/min — AB (ref >90)
GFR, EST NON AFRICAN AMERICAN: 54 mL/min — AB (ref >90)
GLUCOSE: 133 mg/dL — AB (ref 70–99)
POTASSIUM: 4.8 meq/L (ref 3.7–5.3)
SODIUM: 140 meq/L (ref 137–147)
Total Protein: 6.7 g/dL (ref 6.0–8.3)

## 2013-05-20 LAB — TROPONIN I: Troponin I: <0.3 ng/mL (ref <0.30)

## 2013-05-20 LAB — CBC WITH DIFFERENTIAL/PLATELET
Basophils Absolute: 0 10*3/uL (ref 0.0–0.1)
Basophils Relative: 0 % (ref 0–1)
Eosinophils Absolute: 0.3 10*3/uL (ref 0.0–0.7)
Eosinophils Relative: 4 % (ref 0–5)
HCT: 43.1 % (ref 39.0–52.0)
Hemoglobin: 14.3 g/dL (ref 13.0–17.0)
LYMPHS ABS: 1.4 10*3/uL (ref 0.7–4.0)
LYMPHS PCT: 20 % (ref 12–46)
MCH: 29.8 pg (ref 26.0–34.0)
MCHC: 33.2 g/dL (ref 30.0–36.0)
MCV: 89.8 fL (ref 78.0–100.0)
Monocytes Absolute: 1 10*3/uL (ref 0.1–1.0)
Monocytes Relative: 14 % — ABNORMAL HIGH (ref 3–12)
NEUTROS ABS: 4.2 10*3/uL (ref 1.7–7.7)
NEUTROS PCT: 62 % (ref 43–77)
PLATELETS: 223 10*3/uL (ref 150–400)
RBC: 4.8 MIL/uL (ref 4.22–5.81)
RDW: 14.3 % (ref 11.5–15.5)
WBC: 6.9 10*3/uL (ref 4.0–10.5)

## 2013-05-20 LAB — PROTIME-INR
INR: 1.13 (ref 0.00–1.49)
Prothrombin Time: 14.3 seconds (ref 11.6–15.2)

## 2013-05-20 LAB — PRO B NATRIURETIC PEPTIDE: Pro B Natriuretic peptide (BNP): 22.4 pg/mL (ref 0–125)

## 2013-05-20 LAB — LIPASE, BLOOD: Lipase: 54 U/L (ref 11–59)

## 2013-05-20 LAB — APTT: aPTT: 51 seconds — ABNORMAL HIGH (ref 24–37)

## 2013-05-20 SURGERY — LEFT HEART CATHETERIZATION WITH CORONARY ANGIOGRAM
Anesthesia: LOCAL

## 2013-05-20 MED ORDER — ALLOPURINOL 100 MG PO TABS
100.0000 mg | ORAL_TABLET | Freq: Every day | ORAL | Status: DC
  Administered 2013-05-20: 100 mg via ORAL
  Filled 2013-05-20 (×2): qty 1

## 2013-05-20 MED ORDER — HEPARIN SODIUM (PORCINE) 5000 UNIT/ML IJ SOLN
5000.0000 [IU] | Freq: Three times a day (TID) | INTRAMUSCULAR | Status: DC
  Administered 2013-05-20 – 2013-05-21 (×2): 5000 [IU] via SUBCUTANEOUS
  Filled 2013-05-20 (×5): qty 1

## 2013-05-20 MED ORDER — ASPIRIN 81 MG PO CHEW: 324.0000 mg | CHEWABLE_TABLET | ORAL | Status: DC

## 2013-05-20 MED ORDER — SIMVASTATIN 20 MG PO TABS
20.0000 mg | ORAL_TABLET | Freq: Every day | ORAL | Status: DC
  Administered 2013-05-20: 22:00:00 20 mg via ORAL
  Filled 2013-05-20 (×2): qty 1

## 2013-05-20 MED ORDER — AMLODIPINE BESYLATE 5 MG PO TABS
5.0000 mg | ORAL_TABLET | Freq: Every day | ORAL | Status: DC
  Administered 2013-05-20: 5 mg via ORAL
  Filled 2013-05-20 (×2): qty 1

## 2013-05-20 MED ORDER — CARVEDILOL 3.125 MG PO TABS
3.1250 mg | ORAL_TABLET | Freq: Two times a day (BID) | ORAL | Status: DC
  Filled 2013-05-20 (×3): qty 1

## 2013-05-20 MED ORDER — VERAPAMIL HCL 2.5 MG/ML IV SOLN
INTRAVENOUS | Status: AC
  Filled 2013-05-20: qty 2

## 2013-05-20 MED ORDER — HEPARIN (PORCINE) IN NACL 2-0.9 UNIT/ML-% IJ SOLN
INTRAMUSCULAR | Status: AC
  Filled 2013-05-20: qty 1000

## 2013-05-20 MED ORDER — MIDAZOLAM HCL 2 MG/2ML IJ SOLN
INTRAMUSCULAR | Status: AC
  Filled 2013-05-20: qty 2

## 2013-05-20 MED ORDER — NITROGLYCERIN 0.4 MG SL SUBL: 0.4000 mg | SUBLINGUAL_TABLET | SUBLINGUAL | Status: DC | PRN

## 2013-05-20 MED ORDER — ASPIRIN 81 MG PO CHEW: 81.0000 mg | CHEWABLE_TABLET | ORAL | Status: DC

## 2013-05-20 MED ORDER — ASPIRIN 300 MG RE SUPP: 300.0000 mg | RECTAL | Status: DC

## 2013-05-20 MED ORDER — NITROGLYCERIN 0.2 MG/ML ON CALL CATH LAB
INTRAVENOUS | Status: AC
  Filled 2013-05-20: qty 1

## 2013-05-20 MED ORDER — SODIUM CHLORIDE 0.9 % IJ SOLN: 3.0000 mL | INTRAMUSCULAR | Status: DC | PRN

## 2013-05-20 MED ORDER — ASPIRIN 81 MG PO CHEW
81.0000 mg | CHEWABLE_TABLET | Freq: Every day | ORAL | Status: DC
  Filled 2013-05-20: qty 1

## 2013-05-20 MED ORDER — LIDOCAINE HCL (PF) 1 % IJ SOLN
INTRAMUSCULAR | Status: AC
  Filled 2013-05-20: qty 30

## 2013-05-20 MED ORDER — HEPARIN SODIUM (PORCINE) 1000 UNIT/ML IJ SOLN
INTRAMUSCULAR | Status: AC
  Filled 2013-05-20: qty 1

## 2013-05-20 MED ORDER — SODIUM CHLORIDE 0.9 % IJ SOLN: 3.0000 mL | Freq: Two times a day (BID) | INTRAMUSCULAR | Status: DC

## 2013-05-20 MED ORDER — IRBESARTAN 150 MG PO TABS
150.0000 mg | ORAL_TABLET | Freq: Every day | ORAL | Status: DC
  Administered 2013-05-20: 22:00:00 150 mg via ORAL
  Filled 2013-05-20 (×2): qty 1

## 2013-05-20 MED ORDER — SODIUM CHLORIDE 0.9 % IV SOLN: INTRAVENOUS | Status: DC

## 2013-05-20 MED ORDER — FENTANYL CITRATE 0.05 MG/ML IJ SOLN
INTRAMUSCULAR | Status: AC
  Filled 2013-05-20: qty 2

## 2013-05-20 MED ORDER — SODIUM CHLORIDE 0.9 % IV SOLN: 250.0000 mL | INTRAVENOUS | Status: DC | PRN

## 2013-05-20 MED ORDER — SODIUM CHLORIDE 0.9 % IV SOLN: INTRAVENOUS | Status: AC

## 2013-05-20 NOTE — ED Notes (Signed)
73 yo sleeping on left side woke up with left sided chest pain burning sensation that radiates down left arm. Took 324 mg ASA prior to [facility] arrival. Prev on nexium but stopped due to side effects. 20 g in left AC. Vitals now 136/94 HR 60s with PVC. No heart HX.

## 2013-05-20 NOTE — CV Procedure (Signed)
    Cardiac Catheterization Procedure Note  Name: Alexander [name] MRN: [id number] DOB: Jun 02, 1940  Procedure: Left Heart Cath, Selective Coronary Angiography, LV angiography  Indication: 73 yo WM with history of HTN presents with symptoms of chest pain at rest and dynamic inferior ST changes.    Procedural Details: The right wrist was prepped, draped, and anesthetized with 1% lidocaine. Using the modified Seldinger technique, a 5 French sheath was introduced into the right radial artery. 3 mg of verapamil was administered through the sheath, weight-based unfractionated heparin was administered intravenously. Standard Judkins catheters were used for selective coronary angiography and left ventriculography. Catheter exchanges were performed over an exchange length guidewire. There were no immediate procedural complications. A TR band was used for radial hemostasis at the completion of the procedure.  The patient was transferred to the post catheterization recovery area for further monitoring.  Procedural Findings: Hemodynamics: AO 108/61 mean 82 mm Hg LV 118/13 mm Hg  Coronary angiography: Coronary dominance: right  Left mainstem: 30% proximal. Mildly ectatic in the distal left main.  Left anterior descending (LAD): mild ectasia in the proximal vessel. No significant stenosis.  Left circumflex (LCx): moderate ectasia of the proximal vessel extending into OM 1. No obstructive disease.  Right coronary artery (RCA): Severe ectasia of the proximal, mid, and distal vessel. No obstructive disease.  Left ventriculography: Left ventricular systolic function is normal, LVEF is estimated at 55-65%, there is no significant mitral regurgitation   Final Conclusions:   1. Atherosclerotic coronary artery disease manifested as diffuse ectasia. No obstructive disease seen.  2. Normal LV function.  Recommendations: Medical management with anti-anginal and antiplatelet therapy.  Ander Slade  [facility] 05/20/2013, 11:30 AM

## 2013-05-20 NOTE — ED Notes (Signed)
Patient transported to X-ray 

## 2013-05-20 NOTE — Interval H&P Note (Signed)
History and Physical Interval Note:  05/20/2013 10:46 AM  Alexander [name]  has presented today for surgery, with the diagnosis of cp  The various methods of treatment have been discussed with the patient and family. After consideration of risks, benefits and other options for treatment, the patient has consented to  Procedure(s): LEFT HEART CATHETERIZATION WITH CORONARY ANGIOGRAM (N/A) as a surgical intervention .  The patient's history has been reviewed, patient examined, no change in status, stable for surgery.  I have reviewed the patient's chart and labs.  Questions were answered to the patient's satisfaction.   Cath Lab Visit (complete for each Cath Lab visit)  Clinical Evaluation Leading to the Procedure:   ACS: yes  Non-ACS:    Anginal Classification: CCS IV  Anti-ischemic medical therapy: Minimal Therapy (1 class of medications)  Non-Invasive Test Results: No non-invasive testing performed  Prior CABG: No previous CABG        Ander Slade [facility] 05/20/2013 10:46 AM

## 2013-05-20 NOTE — ED Notes (Signed)
MD at bedside. 

## 2013-05-20 NOTE — H&P (Addendum)
Admit date: 05/20/2013 Referring Physician  Dr. Venora Maples Primary Physician [name], DO Primary Cardiologist  None  Reason for Consultation  Chest pain  HPI: 73 year old male with polycythemia vera, hypertension here with chest discomfort and nonspecific lateral ST changes, dynamic during chest pain episode. Troponin normal, BNP normal.  Pain started this morning, center of chest radiating to the left side and down left arm described as a burning. Mild tingling sensation left arm. He got up and walked around and the pain worsened. 7 months ago seen in emergency room for similar situation, but he described at that time that his pain was much worse. discharged home. Had a pharmacologic stress test on 12/24/12 in the outpatient setting which was low risk, no ischemia. He stopped taking Nexium secondary to constipation and stop taking aspirin approximately one month ago.  He states that he has been battling with this chest discomfort for 30 years. Usually his episodes last only a few minutes duration. At one point he thought that it was secondary to the position that he was laying in at night but this was inconclusive.  Nevertheless, when EMS arrived this morning, there were clearly ST segment changes, ST segment elevation in the inferior leads with mild lateral depression in 1 and aVL. Upon arrival to the emergency room, EKG changes had resolved.  He has never been previously diagnosed with Prinzmetal's angina or vasospasm. He is currently taking amlodipine.    PMH:   Past Medical History  Diagnosis Date  . GERD (gastroesophageal reflux disease)   . Hypertension   . Microalbuminuria   . Lumbar back pain   . Hematuria, microscopic   . Hyperglycemia   . Diverticulosis of colon   . Hyperlipidemia   . Gout   . Colon polyp   . GI bleed     secondary to Aspirin  . Polycythemia rubra vera   . Hemorrhoids   . Anal fissure     PSH:   Past Surgical History  Procedure Laterality Date    . Cataract extraction Bilateral   . Inguinal hernia repair    . Tonsillectomy    . Hemorrhoid surgery    . Sabat    . Irrigation and debridement sebaceous cyst     Allergies:  Metformin and related Prior to Admit Meds:   Prior to Admission medications   Medication Sig Start Date End Date Taking? Authorizing Provider  allopurinol (ZYLOPRIM) 100 MG tablet Take 1 tablet (100 mg total) by mouth daily. 07/10/12  Yes Doe-Hyun R Shawna Orleans, DO  amLODipine (NORVASC) 5 MG tablet Take 1 tablet (5 mg total) by mouth daily. 07/10/12  Yes Doe-Hyun R Shawna Orleans, DO  Coenzyme Q10 300 MG CAPS Take 1 capsule by mouth daily.    Yes Historical Provider, MD  olmesartan (BENICAR) 20 MG tablet Take 1 tablet (20 mg total) by mouth daily. 07/30/12  Yes Doe-Hyun R Shawna Orleans, DO  simvastatin (ZOCOR) 20 MG tablet Take 1 tablet (20 mg total) by mouth daily. 07/10/12  Yes Doe-Hyun Kyra Searles, DO   Fam HX:    Family History  Problem Relation Age of Onset  . Colon cancer Mother     died at 92, colorectal  . Heart disease Father 71    MI  . Diabetes Father   . Diabetes Mother   . Stomach cancer Maternal Grandfather    Social HX:    History   Social History  . Marital Status: Married    Spouse Name: N/A  Number of Children: 3  . Years of Education: N/A   Occupational History  . retired Chief Financial Officer    Social History Main Topics  . Smoking status: Never Smoker   . Smokeless tobacco: Never Used     Comment: Never used tobacco  . Alcohol Use: No  . Drug Use: No  . Sexual Activity: Yes    Partners: Female   Other Topics Concern  . Not on file   Social History Narrative   Daughter, [name] (pediatrician--lives in [place], [place])     ROS:  All 11 ROS were addressed and are negative except what is stated in the HPI   Physical Exam: Blood pressure 136/85, pulse 56, temperature 97.7 F (36.5 C), temperature source Oral, resp. rate 15, SpO2 100.00%.   General: Well developed, well nourished, in no acute distress Head: Eyes  PERRLA, No xanthomas.   Normal cephalic and atramatic  Lungs:   Clear bilaterally to auscultation and percussion. Normal respiratory effort. No wheezes, no rales. Heart:   HRRR S1 S2 Pulses are 2+ & equal. No murmur, rubs, gallops.  No carotid bruit. No JVD.  No abdominal bruits.  Abdomen: Bowel sounds are positive, abdomen soft and non-tender without masses. No hepatosplenomegaly. Obese Msk:  Back normal. Normal strength and tone for age. Extremities:  No clubbing, cyanosis or edema.  DP +1 Neuro: Alert and oriented X 3, non-focal, MAE x 4 GU: Deferred Rectal: Deferred Psych:  Good affect, responds appropriately      Labs: Lab Results  Component Value Date   WBC 6.9 05/20/2013   HGB 14.3 05/20/2013   HCT 43.1 05/20/2013   MCV 89.8 05/20/2013   PLT 223 05/20/2013     Recent Labs Lab 05/20/13 0819  NA 140  K 4.8  CL 105  CO2 25  BUN 21  CREATININE 1.27  CALCIUM 9.0  PROT 6.7  BILITOT 0.7  ALKPHOS 80  ALT 19  AST 16  GLUCOSE 133*    Recent Labs  05/20/13 0819  TROPONINI <0.30   Lab Results  Component Value Date   CHOL 145 08/02/2012   HDL 42.00 08/02/2012   LDLCALC 86 08/02/2012   TRIG 87.0 08/02/2012   No results found for this basename: DDIMER     Radiology:  Dg Chest 2 View  05/20/2013   CLINICAL DATA:  Chest pain.  EXAM: CHEST  2 VIEW  COMPARISON:  DG CHEST 2 VIEW dated 12/01/2012  FINDINGS: Mediastinum and hilar structures are normal. The lungs are clear of acute infiltrates. Thank nodular passes noted over both lung bases, possibly nipple shadows. Repeat PA and lateral chest x-rays with nipple markers suggested. Lungs are otherwise clear. No pleural effusion or pneumothorax. Heart size stable. Normal pulmonary vascularity.  IMPRESSION: Faint nodular opacity noted over both lung bases, possibly nipple shadows. Repeat PA and lateral chest x-ray with nipple markers suggested.   Electronically Signed   By: Marcello Moores  Register   On: 05/20/2013 08:41   Personally  viewed.  EKG:  05/20/13 at 7:58 AM-sinus rhythm with nonspecific ST-T wave changes, wandering baseline artifact. 823AM-sinus rhythm, no specific changes. Personally viewed.   ASSESSMENT/PLAN:   73 year old male with polycythemia vera, GERD, hypertension, hyperlipidemia with concerning chest discomfort, dynamic EKG changes with ST segment elevation transiently in inferior leads, nuclear stress test December 2014, low risk with no ischemia.  1. Unstable Angina with transient ST segment elevation inferior leads and lateral ST segment depression - possible vasospasm/Prinzmetal's angina. He may very well in dealing  with this for 30 years. ST segment changes have resolved however his story was quite concerning. After seeing EKG abnormality from EMS, I would like to take him to the cardiac catheterization lab, perform radial artery approach catheterization to further delineate coronary anatomy. It is also possible that he may have RCA lesion. First troponin is normal. He is amenable to this plan. Risk of stroke, heart attack, bleeding, death, renal impairment discussed. Creatinine 1.3.  2. Hypertension-stable. May benefit from low dose carvedilol with alpha if this truly vasospasm.  3. Obesity-encourage weight loss.  4. Polycythemia vera-Dr. Marin Olp. No current issue.  Candee Furbish, MD  05/20/2013  9:55 AM

## 2013-05-20 NOTE — ED Provider Notes (Signed)
CSN: [id number]     Arrival date & time 05/20/13  0753 History   First MD Initiated Contact with Patient 05/20/13 [id number]     Chief Complaint  Patient presents with  . Chest Pain     (Consider location/radiation/quality/duration/timing/severity/associated sxs/prior Treatment) The history is provided by the patient. No language interpreter was used.  Alexander [name] is a 73 year old male with past medical history of GERD, hypertension, chronic lower back pain, hematuria, GI bleed, polycythemia rubra vera presenting to the ED with chest pain that started this morning. Patient reports that the chest discomfort started in the center of his chest and radiated towards the left side and down the left arm described as a burning sensation. Reported that he has been having mild tingling sensations to the left arm. Patient reported that approximately 6-7 months ago patient was seen and assessed in the ED setting regarding similar symptoms where he was discharged home - patient reported that the pain is not as bad as it was then. Stated that he had a chemical stress test performed with negative cardiac findings - reported that he also had to wear a holter monitor from PCP's orders with an arrythmia noted. Stated that EMS was called-en route to the hospital 324 mg of aspirin were administered. Patient reports he does have history of GERD, but was never fully diagnosed -- reported that he stopped taking Nexium secondary to constipation and stopped taking aspirin approximately one month ago. Denied jaw pain, shortness of breath, difficulty breathing, nausea, vomiting, abdominal pain, dizziness, weakness, sudden loss of vision, blurred vision, neck pain, back pain, blurred vision. PCP Dr. Shawna Orleans  Past Medical History  Diagnosis Date  . GERD (gastroesophageal reflux disease)   . Hypertension   . Microalbuminuria   . Lumbar back pain   . Hematuria, microscopic   . Hyperglycemia   . Diverticulosis of colon   .  Hyperlipidemia   . Gout   . Colon polyp   . GI bleed     secondary to Aspirin  . Polycythemia rubra vera   . Hemorrhoids   . Anal fissure    Past Surgical History  Procedure Laterality Date  . Cataract extraction Bilateral   . Inguinal hernia repair    . Tonsillectomy    . Hemorrhoid surgery    . Sabat    . Irrigation and debridement sebaceous cyst     Family History  Problem Relation Age of Onset  . Colon cancer Mother     died at 53, colorectal  . Heart disease Father 75    MI  . Diabetes Father   . Diabetes Mother   . Stomach cancer Maternal Grandfather    History  Substance Use Topics  . Smoking status: Never Smoker   . Smokeless tobacco: Never Used     Comment: Never used tobacco  . Alcohol Use: No    Review of Systems  Constitutional: Negative for fever and chills.  Respiratory: Negative for chest tightness and shortness of breath.   Cardiovascular: Positive for chest pain.  Gastrointestinal: Negative for nausea, vomiting and abdominal pain.  Musculoskeletal: Negative for back pain and neck pain.  Neurological: Negative for dizziness, weakness and numbness.  All other systems reviewed and are negative.     Allergies  Metformin and related  Home Medications   Prior to Admission medications   Medication Sig Start Date End Date Taking? Authorizing Provider  Alcaftadine (LASTACAFT) 0.25 % SOLN Apply to eye every morning.  Historical Provider, MD  allopurinol (ZYLOPRIM) 100 MG tablet Take 1 tablet (100 mg total) by mouth daily. 07/10/12   Doe-Hyun R Shawna Orleans, DO  amLODipine (NORVASC) 5 MG tablet Take 1 tablet (5 mg total) by mouth daily. 07/10/12   Doe-Hyun R Shawna Orleans, DO  aspirin EC 325 MG tablet Take 1 tablet (325 mg total) by mouth daily. 12/02/12   Doe-Hyun R Shawna Orleans, DO  Coenzyme Q10 300 MG CAPS Take by mouth every morning.    Historical Provider, MD  Efinaconazole (JUBLIA) 10 % SOLN Apply 1 drop topically daily. 01/07/13   Max T Hyatt, DPM  esomeprazole (NEXIUM) 40 MG  capsule Take 1 capsule (40 mg total) by mouth daily. 12/02/12   Doe-Hyun R Shawna Orleans, DO  hydrocortisone (ANUSOL-HC) 25 MG suppository Place 25 mg rectally as needed for hemorrhoids. 07/16/12   Ladene Artist, MD  methocarbamol (ROBAXIN) 500 MG tablet Take 500 mg by mouth 2 (two) times daily as needed for muscle spasms.    Historical Provider, MD  olmesartan (BENICAR) 20 MG tablet Take 1 tablet (20 mg total) by mouth daily. 07/30/12   Doe-Hyun R Shawna Orleans, DO  oseltamivir (TAMIFLU) 75 MG capsule Take 1 capsule (75 mg total) by mouth 2 (two) times daily. 02/05/13   Doe-Hyun R Shawna Orleans, DO  saccharomyces boulardii (FLORASTOR) 250 MG capsule Take 250 mg by mouth 2 (two) times daily.    Historical Provider, MD  simvastatin (ZOCOR) 20 MG tablet Take 1 tablet (20 mg total) by mouth daily. 07/10/12   Doe-Hyun R Shawna Orleans, DO  triamcinolone cream (KENALOG) 0.5 % Apply 1 application topically 2 (two) times daily. 05/14/13   Doe-Hyun R Shawna Orleans, DO  Wheat Dextrin (BENEFIBER DRINK MIX PO) Take by mouth every morning.    Historical Provider, MD   BP 138/93  Pulse 73  Temp(Src) 97.7 F (36.5 C) (Oral)  Resp 16  SpO2 100% Physical Exam  Nursing note and vitals reviewed. Constitutional: He is oriented to person, place, and time. He appears well-developed and well-nourished. No distress.  Patient found laying comfortably in bed, without signs of distress  HENT:  Head: Normocephalic and atraumatic.  Mouth/Throat: Oropharynx is clear and moist. No oropharyngeal exudate.  Eyes: Conjunctivae and EOM are normal. Pupils are equal, round, and reactive to light. Right eye exhibits no discharge. Left eye exhibits no discharge.  Neck: Normal range of motion. Neck supple. No tracheal deviation present.  Negative neck stiffness Negative nuchal rigidity Negative cervical lymphadenopathy  Negative pain upon palpation to the c-spine   Cardiovascular: Normal rate, regular rhythm and normal heart sounds.   Pulses:      Radial pulses are 2+ on the right  side, and 2+ on the left side.       Dorsalis pedis pulses are 2+ on the right side, and 2+ on the left side.       Posterior tibial pulses are 2+ on the right side, and 2+ on the left side.  Cap refill < 3 seconds Negative swelling or pitting edema localized to the lower extremities bilaterally   Pulmonary/Chest: Effort normal and breath sounds normal. No respiratory distress. He has no wheezes. He has no rales. He exhibits no tenderness.  Patient is able to speak in full sentences without difficulty  Negative use of accessory muscles Negative stridor Negative pain upon palpation to the chest wall Negative crepitus upon palpation to the chest wall   Musculoskeletal: Normal range of motion.  Full ROM to upper and lower extremities without difficulty  noted, negative ataxia noted.  Lymphadenopathy:    He has no cervical adenopathy.  Neurological: He is alert and oriented to person, place, and time. No cranial nerve deficit. He exhibits normal muscle tone. Coordination normal.  Cranial nerves III-XII grossly intact Strength 5+/5+ to upper and lower extremities bilaterally with resistance applied, equal distribution noted Equal grip strength Negative facial drooping Negative slurred speech Negative aphasia  Skin: Skin is warm and dry. No rash noted. He is not diaphoretic. No erythema.  Psychiatric: He has a normal mood and affect. His behavior is normal. Thought content normal.    ED Course  Procedures (including critical care time)  8:32 AM Patient seen and assessed by attending physician. Dr. Marylene Buerger.   9:17AM This provider spoke with Trish from Cardiology - discussed case, labs, imaging in great detail. Cards to see patient.   9:34 AM This provider spoke with the patient regarding labs and imaging - discussed that patient will most likely be admitted to the hospital regarding his chest pain and changes in his EKG. Discussed with patient that cardiology will be coming down to assess  patient.   10:08 AM This provider spoke with Dr. Kalman Jewels - discussed case, history, labs in great detail. Patient to be admitted to Cardiology services.   Results for orders placed during the hospital encounter of 05/20/13  CBC WITH DIFFERENTIAL      Result Value Ref Range   WBC 6.9  4.0 - 10.5 K/uL   RBC 4.80  4.22 - 5.81 MIL/uL   Hemoglobin 14.3  13.0 - 17.0 g/dL   HCT 43.1  39.0 - 52.0 %   MCV 89.8  78.0 - 100.0 fL   MCH 29.8  26.0 - 34.0 pg   MCHC 33.2  30.0 - 36.0 g/dL   RDW 14.3  11.5 - 15.5 %   Platelets 223  150 - 400 K/uL   Neutrophils Relative % 62  43 - 77 %   Neutro Abs 4.2  1.7 - 7.7 K/uL   Lymphocytes Relative 20  12 - 46 %   Lymphs Abs 1.4  0.7 - 4.0 K/uL   Monocytes Relative 14 (*) 3 - 12 %   Monocytes Absolute 1.0  0.1 - 1.0 K/uL   Eosinophils Relative 4  0 - 5 %   Eosinophils Absolute 0.3  0.0 - 0.7 K/uL   Basophils Relative 0  0 - 1 %   Basophils Absolute 0.0  0.0 - 0.1 K/uL  COMPREHENSIVE METABOLIC PANEL      Result Value Ref Range   Sodium 140  137 - 147 mEq/L   Potassium 4.8  3.7 - 5.3 mEq/L   Chloride 105  96 - 112 mEq/L   CO2 25  19 - 32 mEq/L   Glucose, Bld 133 (*) 70 - 99 mg/dL   BUN 21  6 - 23 mg/dL   Creatinine, Ser 1.27  0.50 - 1.35 mg/dL   Calcium 9.0  8.4 - 10.5 mg/dL   Total Protein 6.7  6.0 - 8.3 g/dL   Albumin 3.5  3.5 - 5.2 g/dL   AST 16  0 - 37 U/L   ALT 19  0 - 53 U/L   Alkaline Phosphatase 80  39 - 117 U/L   Total Bilirubin 0.7  0.3 - 1.2 mg/dL   GFR calc non Af Amer 54 (*) >90 mL/min   GFR calc Af Amer 63 (*) >90 mL/min  TROPONIN I  Result Value Ref Range   Troponin I <0.30  <0.30 ng/mL  PRO B NATRIURETIC PEPTIDE      Result Value Ref Range   Pro B Natriuretic peptide (BNP) 22.4  0 - 125 pg/mL  LIPASE, BLOOD      Result Value Ref Range   Lipase 54  11 - 59 U/L     Labs Review Labs Reviewed  CBC WITH DIFFERENTIAL - Abnormal; Notable for the following:    Monocytes Relative 14 (*)    All other components within  normal limits  COMPREHENSIVE METABOLIC PANEL - Abnormal; Notable for the following:    Glucose, Bld 133 (*)    GFR calc non Af Amer 54 (*)    GFR calc Af Amer 63 (*)    All other components within normal limits  TROPONIN I  PRO B NATRIURETIC PEPTIDE  LIPASE, BLOOD    Imaging Review Dg Chest 2 View  05/20/2013   CLINICAL DATA:  Chest pain.  EXAM: CHEST  2 VIEW  COMPARISON:  DG CHEST 2 VIEW dated 12/01/2012  FINDINGS: Mediastinum and hilar structures are normal. The lungs are clear of acute infiltrates. Thank nodular passes noted over both lung bases, possibly nipple shadows. Repeat PA and lateral chest x-rays with nipple markers suggested. Lungs are otherwise clear. No pleural effusion or pneumothorax. Heart size stable. Normal pulmonary vascularity.  IMPRESSION: Faint nodular opacity noted over both lung bases, possibly nipple shadows. Repeat PA and lateral chest x-ray with nipple markers suggested.   Electronically Signed   By: Marcello Moores  Register   On: 05/20/2013 08:41     EKG Interpretation   Date/Time:  Tuesday May 20 2013 07:58:15 EDT Ventricular Rate:  72 PR Interval:  158 QRS Duration: 111 QT Interval:  428 QTC Calculation: 468 R Axis:   59 Text Interpretation:  Sinus rhythm nonspecific lateral ST changes  nospecific changes as compared to prior ecg Confirmed by CAMPOS  MD, KEVIN  ([id number]) on 05/20/2013 8:14:24 AM      MDM   Final diagnoses:  Chest pain  Abnormal ECG   Medications  0.9 %  sodium chloride infusion ( Intravenous Canceled Entry 05/20/13 0846)   Filed Vitals:   05/20/13 6195 05/20/13 0843 05/20/13 0900 05/20/13 0930  BP:  138/93 139/84 136/85  Pulse:  73 70 56  Temp: 97.7 F (36.5 C)     TempSrc: Oral     Resp:   14 15  SpO2:  100% 100% 100%    This provider reviewed the patient's chart. Patient was seen and assessed in the ED setting regarding chest pain - was diagnosed with chest pain, hyperkalemia, and elevated lipase. Patient was discharged home.  Patient had a chemical stress test performed in December 2014 with negative findings noted.  EKG noted normal sinus rhythm with nonspecific lateral ST changes with a heart rate of 72 beats per minute. At time of chest pain en route to the ED, dynamic changes in the EKG were noted. Troponin negative elevation. BNP negative elevation. CBC negative elevated WBC - negative left shift or leukocytosis. CMP negative findings - kidney and liver functioning well. Chest xray noted faint nodular opacity over both lung bases that could ne possible nipple shadows - recommended repeat with nipple markers. Repeat chest xray with nipple marker noted right lung base nodular opacity - recommended nonemergent CT of chest to be performed.  Patient seen and assessed by cardiology. Patient admitted to Cardiology services regarding chest pain and morphological changes to  the EKG when patient was experiencing pain en route to the ED. Patient to be taken for radicular catheterization. Discussed plan for admission with patient who agreed to plan as well as family at bedside. Patient stable for transfer.  Jamse Mead, PA-C 05/20/13 1606

## 2013-05-20 NOTE — ED Notes (Signed)
No swelling, Pulses reg and equal bilaterally, A/O x4

## 2013-05-21 ENCOUNTER — Encounter (HOSPITAL_COMMUNITY): Payer: Self-pay | Admitting: Physician Assistant

## 2013-05-21 ENCOUNTER — Telehealth: Payer: Self-pay | Admitting: Internal Medicine

## 2013-05-21 DIAGNOSIS — R079 Chest pain, unspecified: Secondary | ICD-10-CM

## 2013-05-21 DIAGNOSIS — K219 Gastro-esophageal reflux disease without esophagitis: Secondary | ICD-10-CM | POA: Diagnosis not present

## 2013-05-21 DIAGNOSIS — I2510 Atherosclerotic heart disease of native coronary artery without angina pectoris: Secondary | ICD-10-CM | POA: Diagnosis not present

## 2013-05-21 DIAGNOSIS — I7789 Other specified disorders of arteries and arterioles: Secondary | ICD-10-CM

## 2013-05-21 DIAGNOSIS — R9431 Abnormal electrocardiogram [ECG] [EKG]: Secondary | ICD-10-CM

## 2013-05-21 DIAGNOSIS — I10 Essential (primary) hypertension: Secondary | ICD-10-CM | POA: Diagnosis not present

## 2013-05-21 DIAGNOSIS — E785 Hyperlipidemia, unspecified: Secondary | ICD-10-CM

## 2013-05-21 DIAGNOSIS — R911 Solitary pulmonary nodule: Secondary | ICD-10-CM

## 2013-05-21 DIAGNOSIS — I200 Unstable angina: Secondary | ICD-10-CM | POA: Diagnosis not present

## 2013-05-21 LAB — TROPONIN I: Troponin I: <0.3 ng/mL (ref <0.30)

## 2013-05-21 LAB — BASIC METABOLIC PANEL
BUN: 18 mg/dL (ref 6–23)
CO2: 24 mEq/L (ref 19–32)
Calcium: 8.7 mg/dL (ref 8.4–10.5)
Chloride: 104 mEq/L (ref 96–112)
Creatinine, Ser: 1.3 mg/dL (ref 0.50–1.35)
GFR calc Af Amer: 61 mL/min — ABNORMAL LOW (ref >90)
GFR, EST NON AFRICAN AMERICAN: 53 mL/min — AB (ref >90)
GLUCOSE: 110 mg/dL — AB (ref 70–99)
Potassium: 4.3 mEq/L (ref 3.7–5.3)
Sodium: 137 mEq/L (ref 137–147)

## 2013-05-21 LAB — CBC
HEMATOCRIT: 41.1 % (ref 39.0–52.0)
HEMOGLOBIN: 13.2 g/dL (ref 13.0–17.0)
MCH: 29.2 pg (ref 26.0–34.0)
MCHC: 32.1 g/dL (ref 30.0–36.0)
MCV: 90.9 fL (ref 78.0–100.0)
Platelets: 195 10*3/uL (ref 150–400)
RBC: 4.52 MIL/uL (ref 4.22–5.81)
RDW: 14.6 % (ref 11.5–15.5)
WBC: 8.9 10*3/uL (ref 4.0–10.5)

## 2013-05-21 LAB — LIPID PANEL
Cholesterol: 112 mg/dL (ref 0–200)
HDL: 35 mg/dL — AB (ref >39)
LDL CALC: 49 mg/dL (ref 0–99)
Total CHOL/HDL Ratio: 3.2 RATIO
Triglycerides: 141 mg/dL (ref <150)
VLDL: 28 mg/dL (ref 0–40)

## 2013-05-21 MED ORDER — ASPIRIN EC 81 MG PO TBEC: 81.0000 mg | DELAYED_RELEASE_TABLET | Freq: Every day | ORAL | Status: DC

## 2013-05-21 MED ORDER — CARVEDILOL 3.125 MG PO TABS: 3.1250 mg | ORAL_TABLET | Freq: Two times a day (BID) | ORAL | Status: DC

## 2013-05-21 MED ORDER — NITROGLYCERIN 0.4 MG SL SUBL: 0.4000 mg | SUBLINGUAL_TABLET | SUBLINGUAL | Status: DC | PRN

## 2013-05-21 NOTE — Progress Notes (Signed)
Patient: Alexander [name] / Admit Date: 05/20/2013 / Date of Encounter: 05/21/2013, 7:50 AM  Subjective  Feels great.   Objective   Telemetry: NSR occ PVCs (h/o such)  Physical Exam: Blood pressure 117/70, pulse 56, temperature 97.9 F (36.6 C), temperature source Oral, resp. rate 20, weight 204 lb 9.4 oz (92.8 kg), SpO2 98.00%. General: Well developed, well nourished WM, in no acute distress. Head: Normocephalic, atraumatic, sclera non-icteric, no xanthomas, nares are without discharge. Neck: Negative for carotid bruits. JVP not elevated. Lungs: Clear bilaterally to auscultation without wheezes, rales, or rhonchi. Breathing is unlabored. Heart: RRR S1 S2 without murmurs, rubs, or gallops.  Abdomen: Soft, non-tender, non-distended with normoactive bowel sounds. No rebound/guarding. Extremities: No clubbing or cyanosis. No edema. Distal pedal pulses are 2+ and equal bilaterally. R radial site without complication, hematoma, ecchymosis or bleeding Neuro: Alert and oriented X 3. Moves all extremities spontaneously. Psych:  Responds to questions appropriately with a normal affect.   Intake/Output Summary (Last 24 hours) at 05/21/13 0750 Last data filed at 05/20/13 2200  Gross per 24 hour  Intake   1335 ml  Output   1600 ml  Net   -265 ml    Inpatient Medications:  . allopurinol  100 mg Oral Daily  . amLODipine  5 mg Oral Daily  . aspirin  81 mg Oral Daily  . carvedilol  3.125 mg Oral BID WC  . heparin  5,000 Units Subcutaneous 3 times per day  . irbesartan  150 mg Oral Daily  . simvastatin  20 mg Oral QHS  . sodium chloride  3 mL Intravenous Q12H   Infusions:  . sodium chloride      Labs:  Recent Labs  05/20/13 0819 05/21/13 0018  NA 140 137  K 4.8 4.3  CL 105 104  CO2 25 24  GLUCOSE 133* 110*  BUN 21 18  CREATININE 1.27 1.30  CALCIUM 9.0 8.7    Recent Labs  05/20/13 0819  AST 16  ALT 19  ALKPHOS 80  BILITOT 0.7  PROT 6.7  ALBUMIN 3.5    Recent  Labs  05/20/13 0819 05/21/13 0018  WBC 6.9 8.9  NEUTROABS 4.2  --   HGB 14.3 13.2  HCT 43.1 41.1  MCV 89.8 90.9  PLT 223 195    Recent Labs  05/20/13 0819 05/20/13 1315 05/20/13 1846 05/21/13 0018  TROPONINI <0.30 <0.30 <0.30 <0.30   No components found with this basename: POCBNP,  No results found for this basename: HGBA1C,  in the last 72 hours   Radiology/Studies:  Dg Chest 2 View  05/20/2013   CLINICAL DATA:  Chest pain  EXAM: CHEST  2 VIEW  COMPARISON:  05/20/2013  FINDINGS: Previously demonstrated nodular opacity at the left lung base corresponds to a nipple. The nodular opacity at the right lung base does not correspond with a nipple marker and is concerning for a pulmonary nodule measuring approximately 13 mm. There is no focal consolidation, pleural effusion, or pneumothorax. The heart and mediastinal contours are unremarkable.  The osseous structures are unremarkable.  IMPRESSION: The nodular opacity at the right lung base does not correspond with a nipple marker and is concerning for a pulmonary nodule. Recommend further evaluation with a nonemergent CT of the chest.   Electronically Signed   By: Kathreen Devoid   On: 05/20/2013 10:09   Dg Chest 2 View  05/20/2013   CLINICAL DATA:  Chest pain.  EXAM: CHEST  2 VIEW  COMPARISON:  DG CHEST 2 VIEW dated 12/01/2012  FINDINGS: Mediastinum and hilar structures are normal. The lungs are clear of acute infiltrates. Thank nodular passes noted over both lung bases, possibly nipple shadows. Repeat PA and lateral chest x-rays with nipple markers suggested. Lungs are otherwise clear. No pleural effusion or pneumothorax. Heart size stable. Normal pulmonary vascularity.  IMPRESSION: Faint nodular opacity noted over both lung bases, possibly nipple shadows. Repeat PA and lateral chest x-ray with nipple markers suggested.   Electronically Signed   By: Marcello Moores  Register   On: 05/20/2013 08:41     Assessment and Plan  1. Chest pain/unstable angina  and dynamic EKG changes showing cath showing atherosclerotic coronary artery disease manifested as diffuse ectasia, normal EF 2. Polycythemia vera followed by heme 3. Hypertension 4. Overweight  Body mass index is 28.55 kg/(m^2). 5. Pulmonary nodule   Continue aspirin, BB, Norvasc and continue statin. Per d/w Dr. Martinique, can pursue outpatient eval for pulm nodule. Have asked patient to contact PCP to discuss further imaging - denies constitutional symptoms. He is interested in cardiac rehab education this AM. Likely DC today.  Signed, Melina Copa PA-C   Patient seen and examined and history reviewed. Agree with above findings and plan. Cardiac enzymes are normal. Ecg is normal. No obstructive CAD. I doubt that his symptoms are cardiac. He has had chest pain for years. Some is clearly musculoskeletal and is worse when he lies on left side. Given coronary ectasia will resume ASA and add low dose carvedilol. Will DC home today. Arrange CT of the chest as an outpatient. Follow up with Dr. Marlou Porch.  Ander Slade [facility] 05/21/2013 9:46 AM

## 2013-05-21 NOTE — Discharge Summary (Signed)
Discharge Summary   Patient ID: Alexander [name] MRN: [id number], DOB/AGE: 73/31/1942 73 y.o. Admit date: 05/20/2013 D/C date:     05/21/2013  Primary Care Provider: [name], DO Primary Cardiologist: Alexander [name] this adm  Primary Discharge Diagnoses:  1. Chest pain/unstable angina and dynamic EKG changes showing cath showing atherosclerotic coronary artery disease manifested as diffuse ectasia, normal EF  2. Polycythemia vera followed by heme  3. Hypertension  4. Overweight Body mass index is 28.55 kg/(m^2).  5. Pulmonary nodule by CXR - instructed to f/u PCP for further eval  Secondary Discharge Diagnoses:  1. GERD 2. Microalbuminuria 3. Lumbar back pain 4. Microscopic hematuria 5. Hyperglycemia 6. Diverticulosis of colon 7. Hyperlipidemia 8. Gout 10. Colon polyp 11. H/o GIB secondary to aspirin 12. Hemorrhoids 13. Arthritis  Hospital Course: Alexander [name] is a 73 year old male with polycythemia vera, hypertension, and no prior cardiac history presented [facility]20/2013 with chest discomfort and nonspecific lateral ST changes, dynamic during chest pain episode. Troponin normal, BNP normal. He has had this chest discomfort for 30 years. Usually his episodes last only a few minutes duration. At one point he thought that it was secondary to the position that he was laying in at night but this was inconclusive. 7 months ago seen in emergency room for similar situation, but he described at that time that his pain was much worse. He was discharged home and underwent a pharmacologic stress test on 12/24/12 in the outpatient setting which was low risk, no ischemia. He stopped taking Nexium secondary to constipation and stop taking aspirin approximately one month ago.   On day of admission the pain began in the morning, located in the center of chest radiating to the left side and down left arm described as a burning. He had mild tingling sensation left arm. He got up  and walked around and the pain worsened. When EMS arrived this morning, there were clearly ST segment changes, ST segment elevation in the inferior leads with mild lateral depression in 1 and aVL. Upon arrival to the emergency room, EKG changes had resolved. He has never been previously diagnosed with Prinzmetal's angina or vasospasm. He was on amlodipine at home. Initial troponin and BNP were normal. He was felt to have unstable angina and was admitted to the hospital for further evaluation. Troponins remained normal. Weight loss was encouraged due to overweight status. He underwent cardiac cath 05/20/13 showing: Coronary dominance: right  Left mainstem: 30% proximal. Mildly ectatic in the distal left main.  Left anterior descending (LAD): mild ectasia in the proximal vessel. No significant stenosis.  Left circumflex (LCx): moderate ectasia of the proximal vessel extending into OM 1. No obstructive disease.  Right coronary artery (RCA): Severe ectasia of the proximal, mid, and distal vessel. No obstructive disease.  Left ventriculography: Left ventricular systolic function is normal, LVEF is estimated at 55-65%, there is no significant mitral regurgitation  Final Conclusions:  1. Atherosclerotic coronary artery disease manifested as diffuse ectasia. No obstructive disease seen.  2. Normal LV function. Medical management with anti-anginal and antiplatelet therapy - he was started on Coreg and aspirin. He did well overnight. Dr. Martinique has seen and examined the patient today and feels he is stable for discharge. As there is prior reported h/o GIB with aspirin the patient was instructed to monitor for bleeding.  At DC the patient requested his Coreg prescription be sent to mail order pharmacy. I called and cancelled Alexander [name] rx and  sent 6 month supply to [facility]. I gave him handwritten Coreg RX to fill this locally for 1 month while he waits for his mail order script to arrive.  Discharge Vitals:  Blood pressure 152/88, pulse 65, temperature 97.7 F (36.5 C), temperature source Oral, resp. rate 20, weight 204 lb 9.4 oz (92.8 kg), SpO2 98.00%.  Labs: Lab Results  Component Value Date   WBC 8.9 05/21/2013   HGB 13.2 05/21/2013   HCT 41.1 05/21/2013   MCV 90.9 05/21/2013   PLT 195 05/21/2013     Recent Labs Lab 05/20/13 0819 05/21/13 0018  NA 140 137  K 4.8 4.3  CL 105 104  CO2 25 24  BUN 21 18  CREATININE 1.27 1.30  CALCIUM 9.0 8.7  PROT 6.7  --   BILITOT 0.7  --   ALKPHOS 80  --   ALT 19  --   AST 16  --   GLUCOSE 133* 110*    Recent Labs  05/20/13 0819 05/20/13 1315 05/20/13 1846 05/21/13 0018  TROPONINI <0.30 <0.30 <0.30 <0.30   Lab Results  Component Value Date   CHOL 112 05/21/2013   HDL 35* 05/21/2013   LDLCALC 49 05/21/2013   TRIG 141 05/21/2013     Diagnostic Studies/Procedures   Cath 05/20/13 Cardiac Catheterization Procedure Note  Name: Alexander [name]  MRN: [id number]  DOB: [birth year]  Procedure: Left Heart Cath, Selective Coronary Angiography, LV angiography  Indication: 73 yo WM with history of HTN presents with symptoms of chest pain at rest and dynamic inferior ST changes.  Procedural Details: The right wrist was prepped, draped, and anesthetized with 1% lidocaine. Using the modified Seldinger technique, a 5 French sheath was introduced into the right radial artery. 3 mg of verapamil was administered through the sheath, weight-based unfractionated heparin was administered intravenously. Standard Judkins catheters were used for selective coronary angiography and left ventriculography. Catheter exchanges were performed over an exchange length guidewire. There were no immediate procedural complications. A TR band was used for radial hemostasis at the completion of the procedure. The patient was transferred to the post catheterization recovery area for further monitoring.  Procedural Findings:  Hemodynamics:  AO 108/61 mean 82 mm Hg  LV 118/13 mm  Hg  Coronary angiography:  Coronary dominance: right  Left mainstem: 30% proximal. Mildly ectatic in the distal left main.  Left anterior descending (LAD): mild ectasia in the proximal vessel. No significant stenosis.  Left circumflex (LCx): moderate ectasia of the proximal vessel extending into OM 1. No obstructive disease.  Right coronary artery (RCA): Severe ectasia of the proximal, mid, and distal vessel. No obstructive disease.  Left ventriculography: Left ventricular systolic function is normal, LVEF is estimated at 55-65%, there is no significant mitral regurgitation  Final Conclusions:  1. Atherosclerotic coronary artery disease manifested as diffuse ectasia. No obstructive disease seen.  2. Normal LV function.  Recommendations: Medical management with anti-anginal and antiplatelet therapy.  Ander Slade [facility]  05/20/2013, 11:30 AM  Dg Chest 2 View  05/20/2013   CLINICAL DATA:  Chest pain  EXAM: CHEST  2 VIEW  COMPARISON:  05/20/2013  FINDINGS: Previously demonstrated nodular opacity at the left lung base corresponds to a nipple. The nodular opacity at the right lung base does not correspond with a nipple marker and is concerning for a pulmonary nodule measuring approximately 13 mm. There is no focal consolidation, pleural effusion, or pneumothorax. The heart and mediastinal contours are unremarkable.  The osseous structures are unremarkable.  IMPRESSION: The nodular opacity at the right lung base does not correspond with a nipple marker and is concerning for a pulmonary nodule. Recommend further evaluation with a nonemergent CT of the chest.   Electronically Signed   By: Kathreen Devoid   On: 05/20/2013 10:09   Dg Chest 2 View  05/20/2013   CLINICAL DATA:  Chest pain.  EXAM: CHEST  2 VIEW  COMPARISON:  DG CHEST 2 VIEW dated 12/01/2012  FINDINGS: Mediastinum and hilar structures are normal. The lungs are clear of acute infiltrates. Thank nodular passes noted over both lung bases, possibly  nipple shadows. Repeat PA and lateral chest x-rays with nipple markers suggested. Lungs are otherwise clear. No pleural effusion or pneumothorax. Heart size stable. Normal pulmonary vascularity.  IMPRESSION: Faint nodular opacity noted over both lung bases, possibly nipple shadows. Repeat PA and lateral chest x-ray with nipple markers suggested.   Electronically Signed   By: Marcello Moores  Register   On: 05/20/2013 08:41    Discharge Medications   Current Discharge Medication List    START taking these medications   Details  aspirin EC 81 MG tablet Take 1 tablet (81 mg total) by mouth daily.    carvedilol (COREG) 3.125 MG tablet Take 1 tablet (3.125 mg total) by mouth 2 (two) times daily with a meal. Qty: 180 tablet, Refills: 1    nitroGLYCERIN (NITROSTAT) 0.4 MG SL tablet Place 1 tablet (0.4 mg total) under the tongue every 5 (five) minutes as needed for chest pain (up to 3 doses). Qty: 25 tablet, Refills: 3      CONTINUE these medications which have NOT CHANGED   Details  allopurinol (ZYLOPRIM) 100 MG tablet Take 1 tablet (100 mg total) by mouth daily.     amLODipine (NORVASC) 5 MG tablet Take 1 tablet (5 mg total) by mouth daily.    Associated Diagnoses: Unspecified essential hypertension    Coenzyme Q10 300 MG CAPS Take 1 capsule by mouth daily.     olmesartan (BENICAR) 20 MG tablet Take 1 tablet (20 mg total) by mouth daily.     simvastatin (ZOCOR) 20 MG tablet Take 1 tablet (20 mg total) by mouth daily.         Disposition   The patient will be discharged in stable condition to home. Discharge Instructions   Diet - low sodium heart healthy    Complete by:  As directed      Increase activity slowly    Complete by:  As directed   No driving for 2 days. No lifting over 5 lbs for 1 week. No sexual activity for 1 week. Keep procedure site clean & dry. If you notice increased pain, swelling, bleeding or pus, call/return!  You may shower, but no soaking baths/hot tubs/pools for 1  week.  If you notice any bleeding such as blood in stool, black tarry stools, blood in urine, nosebleeds or any other unusual bleeding, call your doctor immediately.          Follow-up Information   Follow up with Alexander Pry, DO. (To determine if you need further evaluation such as CT scan for pulmonary nodule seen on chest x-ray)    Specialty:  Internal Medicine   Contact information:   [place] [address] [phone]       Follow up with Richardson Dopp, PA-C. ([facility] - 06/09/13 at 3:40pm)    Specialty:  Physician Assistant   Contact information:   [id number] [facility]  [address] [place] [address] [phone]         Duration of Discharge Encounter: Greater than 30 minutes including physician and PA time.  Signed, Charlie Pitter PA-C 05/21/2013, 10:17 AM

## 2013-05-21 NOTE — Discharge Summary (Signed)
Patient seen and examined and history reviewed. Agree with above findings and plan. See my earlier rounding note.  Ander Slade [facility] 05/21/2013 1:22 PM

## 2013-05-21 NOTE — ED Provider Notes (Signed)
Medical screening examination/treatment/procedure(s) were conducted as a shared visit with non-physician practitioner(s) and myself.  I personally evaluated the patient during the encounter.   EKG Interpretation   Date/Time:  Tuesday May 20 2013 07:58:15 EDT Ventricular Rate:  72 PR Interval:  158 QRS Duration: 111 QT Interval:  428 QTC Calculation: 468 R Axis:   59 Text Interpretation:  Sinus rhythm nonspecific lateral ST changes  nospecific changes as compared to prior ecg Confirmed by Leander Tout  MD, Kiala Faraj  ([id number]) on 05/20/2013 8:14:24 AM      Concerning dynamic ecg changes between EMS ECG and ED ECG. Pain free now. Concerning for ACS. Pt will likely benefit from admission and I suspect will benefit from early diagnostic heart cath.   Dg Chest 2 View  05/20/2013   CLINICAL DATA:  Chest pain  EXAM: CHEST  2 VIEW  COMPARISON:  05/20/2013  FINDINGS: Previously demonstrated nodular opacity at the left lung base corresponds to a nipple. The nodular opacity at the right lung base does not correspond with a nipple marker and is concerning for a pulmonary nodule measuring approximately 13 mm. There is no focal consolidation, pleural effusion, or pneumothorax. The heart and mediastinal contours are unremarkable.  The osseous structures are unremarkable.  IMPRESSION: The nodular opacity at the right lung base does not correspond with a nipple marker and is concerning for a pulmonary nodule. Recommend further evaluation with a nonemergent CT of the chest.   Electronically Signed   By: Kathreen Devoid   On: 05/20/2013 10:09   Dg Chest 2 View  05/20/2013   CLINICAL DATA:  Chest pain.  EXAM: CHEST  2 VIEW  COMPARISON:  DG CHEST 2 VIEW dated 12/01/2012  FINDINGS: Mediastinum and hilar structures are normal. The lungs are clear of acute infiltrates. Thank nodular passes noted over both lung bases, possibly nipple shadows. Repeat PA and lateral chest x-rays with nipple markers suggested. Lungs are otherwise  clear. No pleural effusion or pneumothorax. Heart size stable. Normal pulmonary vascularity.  IMPRESSION: Faint nodular opacity noted over both lung bases, possibly nipple shadows. Repeat PA and lateral chest x-ray with nipple markers suggested.   Electronically Signed   By: Marcello Moores  Register   On: 05/20/2013 08:41  I personally reviewed the imaging tests through PACS system I reviewed available ER/hospitalization records through the [place], MD 05/21/13 [phone]

## 2013-05-21 NOTE — Telephone Encounter (Signed)
Pt was dc'd today from hosp dx poss angina. Pt states hosp advised to fu farly soon. No 30 min appt . pls advise

## 2013-05-21 NOTE — Progress Notes (Signed)
Cardiac Rehab [phone] Pt states that he has ambulated in hall independently. He denies any cp or SOB. Completed discharge education with pt. He voices understanding. Pt agrees to [place]. CRP in [place], will send referral. Deon Pilling, RN 05/21/2013 10:23 AM

## 2013-05-22 ENCOUNTER — Encounter: Payer: Self-pay | Admitting: Internal Medicine

## 2013-05-22 ENCOUNTER — Ambulatory Visit (INDEPENDENT_AMBULATORY_CARE_PROVIDER_SITE_OTHER): Payer: Medicare Other | Admitting: Internal Medicine

## 2013-05-22 VITALS — BP 122/82 | HR 58 | Temp 98.0°F | Ht 71.0 in | Wt 207.0 lb

## 2013-05-22 DIAGNOSIS — I200 Unstable angina: Secondary | ICD-10-CM | POA: Diagnosis not present

## 2013-05-22 DIAGNOSIS — R079 Chest pain, unspecified: Secondary | ICD-10-CM

## 2013-05-22 DIAGNOSIS — D45 Polycythemia vera: Secondary | ICD-10-CM

## 2013-05-22 DIAGNOSIS — I10 Essential (primary) hypertension: Secondary | ICD-10-CM

## 2013-05-22 DIAGNOSIS — R911 Solitary pulmonary nodule: Secondary | ICD-10-CM | POA: Diagnosis not present

## 2013-05-22 DIAGNOSIS — I2510 Atherosclerotic heart disease of native coronary artery without angina pectoris: Secondary | ICD-10-CM | POA: Insufficient documentation

## 2013-05-22 MED ORDER — PANTOPRAZOLE SODIUM 40 MG PO TBEC: 40.0000 mg | DELAYED_RELEASE_TABLET | Freq: Every day | ORAL | Status: DC

## 2013-05-22 NOTE — Telephone Encounter (Signed)
You have nothing till 06/23/13.  Please advise

## 2013-05-22 NOTE — Telephone Encounter (Signed)
Pt coming in today

## 2013-05-22 NOTE — Assessment & Plan Note (Addendum)
Well controlled with addition of Coreg.  Patient history of intermittent loose stools. We discussed possibility that Benicar may be causing mild enteropathy. If persistent gastrointestinal symptoms, consider switch to valsartan 160 mg. BP: 122/82 mmHg  Lab Results  Component Value Date   NA 137 05/21/2013   K 4.3 05/21/2013   CL 104 05/21/2013   CO2 24 05/21/2013   Lab Results  Component Value Date   CREATININE 1.30 05/21/2013

## 2013-05-22 NOTE — Progress Notes (Addendum)
Subjective:    Patient ID: Alexander [name], male    DOB: 04-03-1940, 73 y.o.   MRN: [id number]  HPI  73 year old white male with history of polycythemia vera, hypertension, hyperlipidemia and GERD for hospital followup. Patient admitted on 05/20/2013 secondary to chest pain/unstable angina. Patient noted to have dynamic EKG changes. Patient underwent cardiac cath showing atherosclerotic coronary artery disease manifested as diffuse ectasia but normal ejection fraction.  Patient has had this type of chest pain on and off for the last 30 years. Patient reports when he lays on his left side at night, he can trigger his symptoms. His symptoms usually improve with when he lays supine.  Patient noted to have similar symptoms in December of 2014. He had low risk Myoview. He was prescribed Nexium but discontinued secondary to side effect of loose stools.  Patient was started on Coreg 3.125 mg twice daily. He is tolerated this medication well without side effects.  Patient has not had any recurrence of chest pain since hospital discharge.   During hospitalization patient noted to have pulmonary nodule on chest x-ray. Location of pulmonary nodule right lower lobe. He has remote history of tobacco use for 6 months he was in his early 90s. However he was exposed to secondhand smoke. Both his parents are heavy smokers.  Polycythemia vera-his hemoglobin and hematocrit were 13.2 and 41.1 on 05/21/2013.  Review of Systems Negative for shortness of breath,  Negative for leg swelling, intermittent loose stools.  No leg swelling.    Past Medical History  Diagnosis Date  . GERD (gastroesophageal reflux disease)   . Hypertension   . Microalbuminuria   . Lumbar back pain   . Hematuria, microscopic   . Hyperglycemia   . Diverticulosis of colon   . Hyperlipidemia   . Gout   . Colon polyp   . GI bleed     secondary to Aspirin  . Polycythemia rubra vera   . Hemorrhoids   . Arthritis     "minor;  shoulders primarily" (05/20/2013)  . CAD (coronary artery disease)     a. 05/2013 - Chest pain/unstable angina and dynamic EKG changes showing cath showing atherosclerotic coronary artery disease manifested as diffuse ectasia, normal EF.  [name] Overweight   . Pulmonary nodule     a. 05/2013 - seen on CXR.    History   Social History  . Marital Status: Married    Spouse Name: N/A    Number of Children: 3  . Years of Education: N/A   Occupational History  . retired Chief Financial Officer    Social History Main Topics  . Smoking status: Former Smoker    Types: Cigarettes  . Smokeless tobacco: Never Used     Comment: "smoked for 3 months when I was 73 yr old"  . Alcohol Use: No  . Drug Use: No  . Sexual Activity: Yes    Partners: Female   Other Topics Concern  . Not on file   Social History Narrative   Daughter, [name] (pediatrician--lives in [place], [place])    Past Surgical History  Procedure Laterality Date  . Cataract extraction w/ intraocular lens  implant, bilateral Bilateral 2008  . Excisional hemorrhoidectomy  1970's  . Cardiac catheterization  05/20/2013  . Inguinal hernia repair Right ~ 2010  . Tonsillectomy and adenoidectomy  1940's  . Cystectomy  ~ 2000    " SEBACEOUS cyst removed from between shoulder blades"    Family History  Problem Relation Age of Onset  .  Colon cancer Mother     died at 66, colorectal  . Heart disease Father 56    MI  . Diabetes Father   . Diabetes Mother   . Stomach cancer Maternal Grandfather     Allergies  Allergen Reactions  . Metformin And Related Diarrhea    Current Outpatient Prescriptions on File Prior to Visit  Medication Sig Dispense Refill  . allopurinol (ZYLOPRIM) 100 MG tablet Take 1 tablet (100 mg  total) by mouth daily.  90 tablet  3  . amLODipine (NORVASC) 5 MG tablet Take 1 tablet (5 mg total)  by mouth daily.  90 tablet  3  . aspirin EC 81 MG tablet Take 1 tablet (81 mg total) by mouth daily.      [name] BENICAR 20 MG tablet Take  1 tablet (20 mg total) by mouth daily.  90 tablet  3  . carvedilol (COREG) 3.125 MG tablet Take 1 tablet (3.125 mg total) by mouth 2 (two) times daily with a meal.  180 tablet  1  . Coenzyme Q10 300 MG CAPS Take 1 capsule by mouth daily.       . nitroGLYCERIN (NITROSTAT) 0.4 MG SL tablet Place 1 tablet (0.4 mg total) under the tongue every 5 (five) minutes as needed for chest pain (up to 3 doses).  25 tablet  3  . PROCTOZONE-HC 2.5 % rectal cream Apply rectally two times  daily  60 g  3  . simvastatin (ZOCOR) 20 MG tablet Take 1 tablet (20 mg total) by mouth daily.  90 tablet  3   No current facility-administered medications on file prior to visit.    BP 122/82  Pulse 58  Temp(Src) 98 F (36.7 C) (Oral)  Ht 5\' 11"  (1.803 m)  Wt 207 lb (93.895 kg)  BMI 28.88 kg/m2    Objective:   Physical Exam  Constitutional: He is oriented to person, place, and time. He appears well-developed and well-nourished.  HENT:  Head: Normocephalic and atraumatic.  Cardiovascular: Normal rate, regular rhythm and normal heart sounds.   No murmur heard. Pulmonary/Chest: Effort normal and breath sounds normal. He has no wheezes.  Abdominal: Soft. Bowel sounds are normal. There is no tenderness.  Musculoskeletal: He exhibits no edema.  Neurological: He is alert and oriented to person, place, and time.  Skin: Skin is warm and dry.  Psychiatric: He has a normal mood and affect. His behavior is normal.          Assessment & Plan:

## 2013-05-22 NOTE — Progress Notes (Signed)
Pre visit review using our clinic review tool, if applicable. No additional management support is needed unless otherwise documented below in the visit note. 

## 2013-05-22 NOTE — Addendum Note (Signed)
Addended by: Rosine Abe on: 05/22/2013 06:58 PM   Modules accepted: Level of Service

## 2013-05-22 NOTE — Telephone Encounter (Signed)
We can work him in next week.  I read discharge summary.  No urgency in following up for pulmonary nodule.  Patient may want to consider following up with Dr. Marin Olp to ask whether his chest pains are related to his polycythemia vera.

## 2013-05-22 NOTE — Assessment & Plan Note (Signed)
73 year old white male recently admitted for unstable angina with EKG changes. Cardiac cath revealed nonobstructive coronary disease with diffuse ectasia.  I doubt his symptoms secondary from Prinzmetal angina. It is also unlikely that his chest pain from polycythemia. Continue medical management.  Start Protonix 40 mg once daily. He could not tolerate Nexium secondary to loose stools.

## 2013-05-22 NOTE — Assessment & Plan Note (Signed)
Chest x-ray during hospitalization showed right lower lobe pulmonary nodule. Considering patient had recent cardiac cath and he has mild renal insufficiency I suggest we wait 2 weeks before obtaining CT of chest with IV contrast.

## 2013-05-23 ENCOUNTER — Telehealth: Payer: Self-pay | Admitting: Hematology & Oncology

## 2013-05-23 NOTE — Telephone Encounter (Signed)
Per MD schedule pt sooner than 6-26 for follow up. Pt aware of 6-11 appointment and that he will have to wait an hour between lab and MD

## 2013-06-05 ENCOUNTER — Ambulatory Visit (INDEPENDENT_AMBULATORY_CARE_PROVIDER_SITE_OTHER)
Admission: RE | Admit: 2013-06-05 | Discharge: 2013-06-05 | Disposition: A | Discharge status: Home / Self Care | Payer: Medicare Other | Source: Ambulatory Visit | Attending: Internal Medicine | Admitting: Internal Medicine

## 2013-06-05 DIAGNOSIS — R9389 Abnormal findings on diagnostic imaging of other specified body structures: Secondary | ICD-10-CM | POA: Diagnosis not present

## 2013-06-05 DIAGNOSIS — R911 Solitary pulmonary nodule: Secondary | ICD-10-CM

## 2013-06-05 MED ORDER — IOHEXOL 300 MG/ML  SOLN
80.0000 mL | Freq: Once | INTRAMUSCULAR | Status: AC | PRN
  Administered 2013-06-05: 80 mL via INTRAVENOUS

## 2013-06-09 ENCOUNTER — Encounter: Payer: Self-pay | Admitting: Physician Assistant

## 2013-06-09 ENCOUNTER — Ambulatory Visit (INDEPENDENT_AMBULATORY_CARE_PROVIDER_SITE_OTHER): Payer: Medicare Other | Admitting: Physician Assistant

## 2013-06-09 VITALS — BP 134/56 | HR 48 | Ht 71.0 in | Wt 203.0 lb

## 2013-06-09 DIAGNOSIS — I10 Essential (primary) hypertension: Secondary | ICD-10-CM | POA: Diagnosis not present

## 2013-06-09 DIAGNOSIS — D45 Polycythemia vera: Secondary | ICD-10-CM | POA: Diagnosis not present

## 2013-06-09 DIAGNOSIS — R911 Solitary pulmonary nodule: Secondary | ICD-10-CM

## 2013-06-09 DIAGNOSIS — I200 Unstable angina: Secondary | ICD-10-CM

## 2013-06-09 DIAGNOSIS — E785 Hyperlipidemia, unspecified: Secondary | ICD-10-CM | POA: Diagnosis not present

## 2013-06-09 DIAGNOSIS — I2510 Atherosclerotic heart disease of native coronary artery without angina pectoris: Secondary | ICD-10-CM | POA: Diagnosis not present

## 2013-06-09 NOTE — Progress Notes (Signed)
Cardiology Office Note   Date:  06/09/2013   ID:  [name], DOB 1940/02/08, MRN [id number]  PCP:  [name], DO  Cardiologist:  Dr. Candee Furbish      History of Present Illness: Alexander [name] is a 73 y.o. male with a hx of polycythemia vera, HTN, GERD, HL, CKD, prior GI bleed.  He was admitted 5/19-5/20 with chest pain.  He initially had inferior STE on ECG when EMS arrived.  ECG changes had resolved upon arrival to the ED.  CEs remained normal.  LHC demonstrated atherosclerotic CAD manifested as diffuse ectasia but no significant obstructive disease was noted.  EF was normal.  Med Rx was recommended.  He returns for follow up.  He feels the best he has in quite some time.  The patient denies chest pain, shortness of breath, syncope, orthopnea, PND or significant pedal edema.  He swims every day and denies chest pain with this.     Studies:  - LHC (05/20/13):  Proximal left main 30%, mildly ectatic distal left main, proximal LAD with mild ectasia, proximal circumflex with moderate ectasia, proximal, mid and distal RCA with severe ectasia, EF 55-65%.    - Echo (10/2008):  EF 60-65%, Gr 1 DD  - Nuclear (12/2012):  Normal study, EF 59%   CXR (05/20/13): IMPRESSION: The nodular opacity at the right lung base does not correspond with a nipple marker and is concerning for a pulmonary nodule. Recommend further evaluation with a nonemergent CT of the chest.   Chest CT (06/05/13): IMPRESSION: 1. No suspicious lung nodule is seen. The area questioned by chest x-ray probably represents focal thickening along the major fissure adjacent to the right middle lobe anteriorly and laterally. 2. No mediastinal or hilar adenopathy.   Recent Labs: 08/02/2012: TSH 1.80  05/20/2013: ALT 19; Pro B Natriuretic peptide (BNP) 22.4  05/21/2013: Creatinine 1.30; HDL Cholesterol by NMR 35*; Hemoglobin 13.2; LDL (calc) 49; Potassium 4.3   Wt Readings from Last 3 Encounters:  05/22/13 207 lb (93.895  kg)  05/21/13 204 lb 9.4 oz (92.8 kg)  05/21/13 204 lb 9.4 oz (92.8 kg)     Past Medical History  Diagnosis Date  . GERD (gastroesophageal reflux disease)   . Hypertension   . Microalbuminuria   . Lumbar back pain   . Hematuria, microscopic   . Hyperglycemia   . Diverticulosis of colon   . Hyperlipidemia   . Gout   . Colon polyp   . GI bleed     secondary to Aspirin  . Polycythemia rubra vera   . Hemorrhoids   . Arthritis     "minor; shoulders primarily" (05/20/2013)  . CAD (coronary artery disease)     a. 05/2013 - Chest pain/unstable angina and dynamic EKG changes showing cath showing atherosclerotic coronary artery disease manifested as diffuse ectasia, normal EF.  [name] Overweight   . Pulmonary nodule     a. 05/2013 - seen on CXR.    Current Outpatient Prescriptions  Medication Sig Dispense Refill  . allopurinol (ZYLOPRIM) 100 MG tablet Take 1 tablet (100 mg  total) by mouth daily.  90 tablet  3  . amLODipine (NORVASC) 5 MG tablet Take 1 tablet (5 mg total)  by mouth daily.  90 tablet  3  . aspirin EC 81 MG tablet Take 1 tablet (81 mg total) by mouth daily.      [name] BENICAR 20 MG tablet Take 1 tablet (20 mg total) by mouth daily.  [place]  tablet  3  . carvedilol (COREG) 3.125 MG tablet Take 1 tablet (3.125 mg total) by mouth 2 (two) times daily with a meal.  180 tablet  1  . Coenzyme Q10 300 MG CAPS Take 1 capsule by mouth daily.       . nitroGLYCERIN (NITROSTAT) 0.4 MG SL tablet Place 1 tablet (0.4 mg total) under the tongue every 5 (five) minutes as needed for chest pain (up to 3 doses).  25 tablet  3  . pantoprazole (PROTONIX) 40 MG tablet Take 1 tablet (40 mg total) by mouth daily.  90 tablet  1  . PROCTOZONE-HC 2.5 % rectal cream Apply rectally two times  daily  60 g  3  . simvastatin (ZOCOR) 20 MG tablet Take 1 tablet (20 mg total) by mouth daily.  90 tablet  3   No current facility-administered medications for this visit.    Allergies:   Metformin and related   Social  History:  The patient  reports that he has quit smoking. His smoking use included Cigarettes. He smoked 0.00 packs per day. He has never used smokeless tobacco. He reports that he does not drink alcohol or use illicit drugs.   Family History:  The patient's family history includes Colon cancer in his mother; Diabetes in his father and mother; Heart disease (age of onset: 13) in his father; Stomach cancer in his maternal grandfather.   ROS:  Please see the history of present illness.      All other systems reviewed and negative.   PHYSICAL EXAM: VS:  BP 134/56  Pulse 48  Ht 5\' 11"  (1.803 m)  Wt 203 lb (92.08 kg)  BMI 28.33 kg/m2 Well nourished, well developed, in no acute distress HEENT: normal Neck: no JVD Cardiac:  normal S1, S2; RRR; no murmur Lungs:  clear to auscultation bilaterally, no wheezing, rhonchi or rales Abd: soft, nontender, no hepatomegaly Ext: no edemaright wrist without hematoma or mass  Skin: warm and dry Neuro:  CNs 2-12 intact, no focal abnormalities noted  EKG:  Sinus brady, HR 48, NSSTTW changes     ASSESSMENT AND PLAN:  1. CAD (coronary artery disease): Diffuse ectasia noted on cardiac catheterization without significant obstructive disease. Continue medical therapy with aspirin, beta blocker, statin. He has had no further chest pain. 2. HYPERTENSION: Controlled. 3. HYPERLIPIDEMIA: Continue statin. 4. POLYCYTHEMIA RUBRA VERA: Continue followup with hematology. 5. Pulmonary nodule: Followup chest CT without lung nodule. No further workup planned. 6. Bradycardia: He is asymptomatic. Continue current therapy. 7. Disposition:  F/u with Dr. Candee Furbish in 6 mos.    Signed, Versie Starks, MHS 06/09/2013 4:36 PM    [facility] [place], [place],   [address] Phone: [phone]; Fax: [phone]

## 2013-06-09 NOTE — Patient Instructions (Signed)
Your physician recommends that you schedule a follow-up appointment in:  6 mos with Dr. Candee Furbish.

## 2013-06-12 ENCOUNTER — Other Ambulatory Visit (HOSPITAL_BASED_OUTPATIENT_CLINIC_OR_DEPARTMENT_OTHER): Payer: Medicare Other | Admitting: Lab

## 2013-06-12 ENCOUNTER — Encounter: Payer: Self-pay | Admitting: Hematology & Oncology

## 2013-06-12 ENCOUNTER — Ambulatory Visit (HOSPITAL_BASED_OUTPATIENT_CLINIC_OR_DEPARTMENT_OTHER): Payer: Medicare Other | Admitting: Hematology & Oncology

## 2013-06-12 VITALS — BP 117/76 | HR 62 | Temp 98.2°F | Resp 18 | Ht 71.0 in | Wt 204.0 lb

## 2013-06-12 DIAGNOSIS — D45 Polycythemia vera: Secondary | ICD-10-CM

## 2013-06-12 DIAGNOSIS — R079 Chest pain, unspecified: Secondary | ICD-10-CM

## 2013-06-12 DIAGNOSIS — R12 Heartburn: Secondary | ICD-10-CM

## 2013-06-12 LAB — CBC WITH DIFFERENTIAL (CANCER CENTER ONLY)
BASO#: 0 10*3/uL (ref 0.0–0.2)
BASO%: 0.5 % (ref 0.0–2.0)
EOS%: 3.8 % (ref 0.0–7.0)
Eosinophils Absolute: 0.3 10*3/uL (ref 0.0–0.5)
HCT: 45.1 % (ref 38.7–49.9)
HGB: 14.9 g/dL (ref 13.0–17.1)
LYMPH#: 2.2 10*3/uL (ref 0.9–3.3)
LYMPH%: 26 % (ref 14.0–48.0)
MCH: 29.6 pg (ref 28.0–33.4)
MCHC: 33 g/dL (ref 32.0–35.9)
MCV: 90 fL (ref 82–98)
MONO#: 1.4 10*3/uL — ABNORMAL HIGH (ref 0.1–0.9)
MONO%: 16.3 % — ABNORMAL HIGH (ref 0.0–13.0)
NEUT#: 4.5 10*3/uL (ref 1.5–6.5)
NEUT%: 53.4 % (ref 40.0–80.0)
Platelets: 209 10*3/uL (ref 145–400)
RBC: 5.03 10*6/uL (ref 4.20–5.70)
RDW: 14.1 % (ref 11.1–15.7)
WBC: 8.4 10*3/uL (ref 4.0–10.0)

## 2013-06-12 NOTE — Progress Notes (Signed)
Hematology and Oncology Follow Up Visit  Alexander [name] [id number] [birth year] 73 y.o. 06/12/2013   Principle Diagnosis:  Polycythemia vera-JAK2 negative  Current Therapy:    Phlebotomy to maintain hematocrit below 45%  Aspirin 81 mg by mouth daily     Interim History:  [name] is in for a followup. He is in a cuboids early. Apparently, his family doctor told him to come see [place].  He's been having this chest pain issue. It sounds like angina. He has been seen by cardiology. He has had cardiac cath. He did have a CT scan done. There was a concern of a pulmonary nodule in the right lung. This did not show up on a CT scan.  He and his wife had just gotten back from [place]. They like to go over to [place] to visit family.  He has had no change in his medications.  On his cardiac cath, he had a 30% left main. He had ectasia of his main vessels. No obstructive disease noted. He had a ejection fraction of 55-65%. It was recommended that he just had medical management.   Medications: Current outpatient prescriptions:allopurinol (ZYLOPRIM) 100 MG tablet, Take 1 tablet (100 mg  total) by mouth daily., Disp: 90 tablet, Rfl: 3;  amLODipine (NORVASC) 5 MG tablet, Take 1 tablet (5 mg total)  by mouth daily., Disp: 90 tablet, Rfl: 3;  aspirin EC 81 MG tablet, Take 1 tablet (81 mg total) by mouth daily., Disp: , Rfl: ;  BENICAR 20 MG tablet, Take 1 tablet (20 mg total) by mouth daily., Disp: 90 tablet, Rfl: 3 carvedilol (COREG) 3.125 MG tablet, Take 1 tablet (3.125 mg total) by mouth 2 (two) times daily with a meal., Disp: 180 tablet, Rfl: 1;  Coenzyme Q10 300 MG CAPS, Take 1 capsule by mouth daily. , Disp: , Rfl: ;  pantoprazole (PROTONIX) 40 MG tablet, Take 1 tablet (40 mg total) by mouth daily., Disp: 90 tablet, Rfl: 1;  PROCTOZONE-HC 2.5 % rectal cream, Apply rectally two times  daily, Disp: 60 g, Rfl: 3 simvastatin (ZOCOR) 20 MG tablet, Take 1 tablet (20 mg total) by mouth daily., Disp: 90  tablet, Rfl: 3;  nitroGLYCERIN (NITROSTAT) 0.4 MG SL tablet, Place 1 tablet (0.4 mg total) under the tongue every 5 (five) minutes as needed for chest pain (up to 3 doses)., Disp: 25 tablet, Rfl: 3  Allergies:  Allergies  Allergen Reactions  . Metformin And Related Diarrhea    Past Medical History, Surgical history, Social history, and Family History were reviewed and updated.  Review of Systems: As above  Physical Exam:  height is 5\' 11"  (1.803 m) and weight is 204 lb (92.534 kg). His oral temperature is 98.2 F (36.8 C). His blood pressure is 117/76 and his pulse is 62. His respiration is 18.   Well-developed and well-nourished gentleman. His lungs are clear. Cardiac exam regular in rhythm. Abdomen is soft. Has good bowel sounds. There is no fluid wave. There is no palpable liver or spleen tip. Extremities shows no clubbing cyanosis or edema. Skin exam no rashes. Neurological exam is nonfocal.  Lab Results  Component Value Date   WBC 8.4 06/12/2013   HGB 14.9 06/12/2013   HCT 45.1 06/12/2013   MCV 90 06/12/2013   PLT 209 06/12/2013     Chemistry      Component Value Date/Time   NA 137 05/21/2013 0018   K 4.3 05/21/2013 0018   CL 104 05/21/2013 0018   CO2  24 05/21/2013 0018   BUN 18 05/21/2013 0018   CREATININE 1.30 05/21/2013 0018   CREATININE 1.19 08/09/2010 1159      Component Value Date/Time   CALCIUM 8.7 05/21/2013 0018   ALKPHOS 80 05/20/2013 0819   AST 16 05/20/2013 0819   ALT 19 05/20/2013 0819   BILITOT 0.7 05/20/2013 0819         Impression and Plan: Alexander [name] is 73 year old gentleman. He is polycythemia. We have done a good job in [place] him and keeping his blood counts adequate. I don't think we have to phlebotomize him right now.  I will get a upper GI on him so we can see if there is a hiatal hernia. This might be causing some of his symptoms. He is on Protonix. There is a hiatal hernia I wonder if Reglan might help.  For now, I will plan to have come  back in another 3 months.   [name], MD 6/11/20155:42 PM

## 2013-06-13 ENCOUNTER — Telehealth: Payer: Self-pay | Admitting: Hematology & Oncology

## 2013-06-13 LAB — FERRITIN CHCC: FERRITIN: 21 ng/mL — AB (ref 22–316)

## 2013-06-13 NOTE — Telephone Encounter (Signed)
Pt aware of 6-18 930 pm upper GI at [facility] and to be NPO 6hrs before. He is also aware of 9-4

## 2013-06-15 ENCOUNTER — Encounter: Payer: Self-pay | Admitting: Internal Medicine

## 2013-06-17 MED ORDER — CARVEDILOL 3.125 MG PO TABS: 3.1250 mg | ORAL_TABLET | Freq: Two times a day (BID) | ORAL | Status: DC

## 2013-06-19 ENCOUNTER — Ambulatory Visit (HOSPITAL_COMMUNITY)
Admission: RE | Admit: 2013-06-19 | Discharge: 2013-06-19 | Disposition: A | Discharge status: Home / Self Care | Payer: Medicare Other | Source: Ambulatory Visit | Attending: Hematology & Oncology | Admitting: Hematology & Oncology

## 2013-06-19 ENCOUNTER — Other Ambulatory Visit: Payer: Self-pay | Admitting: Hematology & Oncology

## 2013-06-19 DIAGNOSIS — R12 Heartburn: Secondary | ICD-10-CM | POA: Diagnosis not present

## 2013-06-19 DIAGNOSIS — R079 Chest pain, unspecified: Secondary | ICD-10-CM | POA: Insufficient documentation

## 2013-06-20 ENCOUNTER — Encounter: Payer: Self-pay | Admitting: *Deleted

## 2013-06-25 ENCOUNTER — Telehealth: Payer: Self-pay | Admitting: *Deleted

## 2013-06-25 MED ORDER — EFINACONAZOLE 10 % EX SOLN
1.0000 | Freq: Every day | CUTANEOUS | Status: DC
Start: 2013-06-25 — End: 2014-06-19

## 2013-06-25 NOTE — Telephone Encounter (Signed)
Had me on Jublia, I got 6 months free.  A new prescription needs to be sent to my mail order pharmacy.  [place] said the promotion has expired.  I called and informed him that I sent the prescription to [facility].  He asked if I sent it for a 90 day supply.  I told him I sent it for 6 refills.  He asked if the size was 64ml.  I told him I ordered the 34ml, should last for 2 months.  He stated that's what I thought.

## 2013-06-27 ENCOUNTER — Ambulatory Visit: Payer: Medicare Other | Admitting: Hematology & Oncology

## 2013-06-27 ENCOUNTER — Other Ambulatory Visit: Payer: Medicare Other | Admitting: Lab

## 2013-07-01 ENCOUNTER — Encounter: Payer: Self-pay | Admitting: Internal Medicine

## 2013-07-01 DIAGNOSIS — K649 Unspecified hemorrhoids: Secondary | ICD-10-CM

## 2013-07-08 ENCOUNTER — Telehealth: Payer: Self-pay | Admitting: *Deleted

## 2013-07-08 NOTE — Telephone Encounter (Signed)
Need clarification on his prescription for Jublia.  There's not a date of birth on here.  Please call [place] back.  I returned her call.  She stated the patient requested a 3 month supply.  He stated he was receiving 57ml through mail order.  Is it okay to change to that.  I told her yes it's okay to change it to 3 month supplies.

## 2013-07-09 ENCOUNTER — Other Ambulatory Visit: Payer: Self-pay | Admitting: *Deleted

## 2013-07-09 DIAGNOSIS — R7309 Other abnormal glucose: Secondary | ICD-10-CM

## 2013-07-09 DIAGNOSIS — E785 Hyperlipidemia, unspecified: Secondary | ICD-10-CM

## 2013-07-09 DIAGNOSIS — I10 Essential (primary) hypertension: Secondary | ICD-10-CM

## 2013-07-10 ENCOUNTER — Other Ambulatory Visit (INDEPENDENT_AMBULATORY_CARE_PROVIDER_SITE_OTHER): Payer: Medicare Other

## 2013-07-10 DIAGNOSIS — R7309 Other abnormal glucose: Secondary | ICD-10-CM | POA: Diagnosis not present

## 2013-07-10 DIAGNOSIS — E785 Hyperlipidemia, unspecified: Secondary | ICD-10-CM | POA: Diagnosis not present

## 2013-07-10 DIAGNOSIS — I10 Essential (primary) hypertension: Secondary | ICD-10-CM

## 2013-07-10 LAB — LIPID PANEL
CHOL/HDL RATIO: 3
Cholesterol: 111 mg/dL (ref 0–200)
HDL: 41.6 mg/dL (ref >39.00)
LDL CALC: 52 mg/dL (ref 0–99)
NonHDL: 69.4
TRIGLYCERIDES: 87 mg/dL (ref 0.0–149.0)
VLDL: 17.4 mg/dL (ref 0.0–40.0)

## 2013-07-10 LAB — HEMOGLOBIN A1C: Hgb A1c MFr Bld: 6.2 % (ref 4.6–6.5)

## 2013-07-10 LAB — HEPATIC FUNCTION PANEL
ALBUMIN: 4 g/dL (ref 3.5–5.2)
ALT: 16 U/L (ref 0–53)
AST: 15 U/L (ref 0–37)
Alkaline Phosphatase: 80 U/L (ref 39–117)
BILIRUBIN DIRECT: 0.2 mg/dL (ref 0.0–0.3)
TOTAL PROTEIN: 7.3 g/dL (ref 6.0–8.3)
Total Bilirubin: 1.2 mg/dL (ref 0.2–1.2)

## 2013-07-10 LAB — BASIC METABOLIC PANEL
BUN: 14 mg/dL (ref 6–23)
CALCIUM: 9.7 mg/dL (ref 8.4–10.5)
CHLORIDE: 104 meq/L (ref 96–112)
CO2: 30 meq/L (ref 19–32)
Creatinine, Ser: 1.3 mg/dL (ref 0.4–1.5)
GFR: 57.45 mL/min — ABNORMAL LOW (ref >60.00)
GLUCOSE: 121 mg/dL — AB (ref 70–99)
Potassium: 4.9 mEq/L (ref 3.5–5.1)
SODIUM: 139 meq/L (ref 135–145)

## 2013-07-10 LAB — TSH: TSH: 0.87 u[IU]/mL (ref 0.35–4.50)

## 2013-07-15 ENCOUNTER — Encounter: Payer: Self-pay | Admitting: Podiatry

## 2013-07-15 ENCOUNTER — Ambulatory Visit (INDEPENDENT_AMBULATORY_CARE_PROVIDER_SITE_OTHER): Payer: Medicare Other | Admitting: Podiatry

## 2013-07-15 DIAGNOSIS — M79609 Pain in unspecified limb: Secondary | ICD-10-CM | POA: Diagnosis not present

## 2013-07-15 DIAGNOSIS — M79676 Pain in unspecified toe(s): Secondary | ICD-10-CM

## 2013-07-15 DIAGNOSIS — B351 Tinea unguium: Secondary | ICD-10-CM

## 2013-07-16 NOTE — Progress Notes (Signed)
He presents today chief complaint of painful elongated toenails.   Objective: Pulses are palpable bilateral. Nails are thick yellow dystrophic with mycotic and painful palpation.  Assessment: Pain in limb secondary to onychomycosis 1 through 5 bilateral.  Plan: Debridement of nails 1 through 5 bilateral covered service secondary to pain. Continue topical antifungal.

## 2013-07-18 ENCOUNTER — Encounter: Payer: Self-pay | Admitting: Internal Medicine

## 2013-07-18 ENCOUNTER — Ambulatory Visit (INDEPENDENT_AMBULATORY_CARE_PROVIDER_SITE_OTHER): Payer: Medicare Other | Admitting: Internal Medicine

## 2013-07-18 VITALS — BP 120/80 | Temp 97.5°F | Wt 196.0 lb

## 2013-07-18 DIAGNOSIS — R911 Solitary pulmonary nodule: Secondary | ICD-10-CM | POA: Diagnosis not present

## 2013-07-18 DIAGNOSIS — R7309 Other abnormal glucose: Secondary | ICD-10-CM

## 2013-07-18 DIAGNOSIS — Z23 Encounter for immunization: Secondary | ICD-10-CM

## 2013-07-18 DIAGNOSIS — R7303 Prediabetes: Secondary | ICD-10-CM

## 2013-07-18 DIAGNOSIS — I10 Essential (primary) hypertension: Secondary | ICD-10-CM

## 2013-07-18 DIAGNOSIS — I200 Unstable angina: Secondary | ICD-10-CM | POA: Diagnosis not present

## 2013-07-18 NOTE — Patient Instructions (Signed)
Please complete the following lab tests before your next follow up appointment: BMET, A1c - 790.29, 401.9

## 2013-07-18 NOTE — Assessment & Plan Note (Signed)
Stable.  Continue to manage with diet and exercise. Lab Results  Component Value Date   HGBA1C 6.2 07/10/2013

## 2013-07-18 NOTE — Assessment & Plan Note (Signed)
Well controlled. BP: 120/80 mmHg

## 2013-07-18 NOTE — Progress Notes (Signed)
Subjective:    Patient ID: Alexander [name], male    DOB: 1940/09/25, 73 y.o.   MRN: [id number]  HPI  73 year old white male with history of polycythemia vera, abnormal glucose, and hypertension.  General medical history-patient seen by hematology. Patient is not due for phlebotomy.  Abnormal glucose-patient significantly changed his diet - he decreased his intake of carbohydrates. He has lost approximately 8 pounds since previous visit.  History bleeding hemorrhoids - he is still occasionally asymptomatic. He has appointment with Dr. Fuller Plan.  Review of Systems Negative for chest pain, negative abdominal pain    Past Medical History  Diagnosis Date  . GERD (gastroesophageal reflux disease)   . Hypertension   . Microalbuminuria   . Lumbar back pain   . Hematuria, microscopic   . Hyperglycemia   . Diverticulosis of colon   . Hyperlipidemia   . Gout   . Tubular adenoma of colon 07/2012  . GI bleed     secondary to Aspirin  . Polycythemia rubra vera   . Hemorrhoids   . Arthritis     "minor; shoulders primarily" (05/20/2013)  . CAD (coronary artery disease)     a. 05/2013 - Chest pain/unstable angina and dynamic EKG changes showing cath showing atherosclerotic coronary artery disease manifested as diffuse ectasia, normal EF.  [name] Overweight(278.02)   . Pulmonary nodule     a. 05/2013 - seen on CXR.    History   Social History  . Marital Status: Married    Spouse Name: N/A    Number of Children: 3  . Years of Education: N/A   Occupational History  . retired Chief Financial Officer    Social History Main Topics  . Smoking status: Never Smoker   . Smokeless tobacco: Never Used     Comment: smoked for 3 months only  . Alcohol Use: No  . Drug Use: No  . Sexual Activity: Yes    Partners: Female   Other Topics Concern  . Not on file   Social History Narrative   Daughter, [name] (pediatrician--lives in [place], [place])    Past Surgical History  Procedure Laterality Date  .  Cataract extraction w/ intraocular lens  implant, bilateral Bilateral 2008  . Excisional hemorrhoidectomy  1970's  . Cardiac catheterization  05/20/2013  . Inguinal hernia repair Right ~ 2010  . Tonsillectomy and adenoidectomy  1940's  . Cystectomy  ~ 2000    " SEBACEOUS cyst removed from between shoulder blades"    Family History  Problem Relation Age of Onset  . Colon cancer Mother     died at 5, colorectal  . Heart disease Father 48    MI  . Diabetes Father   . Diabetes Mother   . Stomach cancer Maternal Grandfather     Allergies  Allergen Reactions  . Metformin And Related Diarrhea    Current Outpatient Prescriptions on File Prior to Visit  Medication Sig Dispense Refill  . allopurinol (ZYLOPRIM) 100 MG tablet Take 1 tablet (100 mg  total) by mouth daily.  90 tablet  3  . amLODipine (NORVASC) 5 MG tablet Take 1 tablet (5 mg total)  by mouth daily.  90 tablet  3  . aspirin EC 81 MG tablet Take 1 tablet (81 mg total) by mouth daily.      [name] BENICAR 20 MG tablet Take 1 tablet (20 mg total) by mouth daily.  90 tablet  3  . carvedilol (COREG) 3.125 MG tablet Take 1 tablet (3.125 mg total)  by mouth 2 (two) times daily with a meal.  28 tablet  0  . Coenzyme Q10 300 MG CAPS Take 1 capsule by mouth daily.       . Efinaconazole (JUBLIA) 10 % SOLN Apply 1 application topically daily.  8 mL  5  . nitroGLYCERIN (NITROSTAT) 0.4 MG SL tablet Place 1 tablet (0.4 mg total) under the tongue every 5 (five) minutes as needed for chest pain (up to 3 doses).  25 tablet  3  . pantoprazole (PROTONIX) 40 MG tablet Take 1 tablet (40 mg total) by mouth daily.  90 tablet  1  . simvastatin (ZOCOR) 20 MG tablet Take 1 tablet (20 mg total) by mouth daily.  90 tablet  3  . PROCTOZONE-HC 2.5 % rectal cream Apply rectally two times  daily  60 g  3   No current facility-administered medications on file prior to visit.    BP 120/80  Temp(Src) 97.5 F (36.4 C) (Oral)  Wt 196 lb (88.905  kg)     Objective:   Physical Exam  Constitutional: He is oriented to person, place, and time. He appears well-developed and well-nourished. No distress.  Cardiovascular: Normal rate, regular rhythm and normal heart sounds.   No murmur heard. Pulmonary/Chest: Effort normal and breath sounds normal. He has no wheezes.  Musculoskeletal: He exhibits no edema.  Neurological: He is alert and oriented to person, place, and time.  Psychiatric: He has a normal mood and affect. His behavior is normal.          Assessment & Plan:

## 2013-07-18 NOTE — Progress Notes (Signed)
Pre visit review using our clinic review tool, if applicable. No additional management support is needed unless otherwise documented below in the visit note. 

## 2013-07-18 NOTE — Assessment & Plan Note (Signed)
CT of chest negative for pulmonary nodule.  No further testing required.

## 2013-07-25 ENCOUNTER — Telehealth: Payer: Self-pay | Admitting: Internal Medicine

## 2013-07-25 MED ORDER — GLUCOSE BLOOD VI STRP: 1.0000 | ORAL_STRIP | Freq: Every day | Status: DC

## 2013-07-25 NOTE — Telephone Encounter (Signed)
rx sent in electronically 

## 2013-07-25 NOTE — Telephone Encounter (Signed)
[facility] is requesting re-fill on FREESTYLE LITE TEST STRIP

## 2013-07-28 ENCOUNTER — Other Ambulatory Visit: Payer: Medicare Other

## 2013-07-28 ENCOUNTER — Encounter: Payer: Self-pay | Admitting: Internal Medicine

## 2013-07-30 ENCOUNTER — Encounter: Payer: Self-pay | Admitting: Gastroenterology

## 2013-07-30 ENCOUNTER — Ambulatory Visit (INDEPENDENT_AMBULATORY_CARE_PROVIDER_SITE_OTHER): Payer: Medicare Other | Admitting: Gastroenterology

## 2013-07-30 ENCOUNTER — Other Ambulatory Visit: Payer: Medicare Other | Admitting: Gastroenterology

## 2013-07-30 VITALS — BP 124/72 | HR 56 | Ht 70.25 in | Wt 193.5 lb

## 2013-07-30 DIAGNOSIS — I200 Unstable angina: Secondary | ICD-10-CM

## 2013-07-30 DIAGNOSIS — K921 Melena: Secondary | ICD-10-CM | POA: Diagnosis not present

## 2013-07-30 DIAGNOSIS — K648 Other hemorrhoids: Secondary | ICD-10-CM | POA: Diagnosis not present

## 2013-07-30 DIAGNOSIS — Z86010 Personal history of colonic polyps: Secondary | ICD-10-CM

## 2013-07-30 NOTE — Progress Notes (Addendum)
    History of Present Illness: This is a 73 year old male accompanied by his wife with intermittent hematochezia polycythemia vera who undergoes phlebotomies. He underwent colonoscopy in July 2014 revealing moderate-sized internal hemorrhoids, moderate left colon diverticulosis and 3 small adenomatous colon polyps. He relates moderate bright and dark red rectal bleeding occurring one to 2 days per week with bowel movements  Current Medications, Allergies, Past Medical History, Past Surgical History, Family History and Social History were reviewed in [employer] record.  Physical Exam: General: Well developed , well nourished, no acute distress Head: Normocephalic and atraumatic Eyes:  sclerae anicteric, EOMI Ears: Normal auditory acuity Mouth: No deformity or lesions Lungs: Clear throughout to auscultation Heart: Regular rate and rhythm; no murmurs, rubs or bruits Abdomen: Soft, non tender and non distended. No masses, hepatosplenomegaly or hernias noted. Normal Bowel sounds Rectal: Moderate external hemorrhoids. No internal lesions Hemoccult negative brown stool in the vault Musculoskeletal: Symmetrical with no gross deformities  Pulses:  Normal pulses noted Extremities: No clubbing, cyanosis, edema or deformities noted Neurological: Alert oriented x 4, grossly nonfocal Psychological:  Alert and cooperative. Normal mood and affect  Assessment and Recommendations:  1. Hematochezia secondary to internal and external hemorrhoids. Options of hemorrhoidal banding, hemorrhoidal injection and surgical referral for operative management were discussed in detail. He elected to proceed with injection therapy. The risks, benefits, and alternatives to flexible sigmoidoscopy with possible biopsy, possible polypectomy and possible injection of hemorrhoids were discussed with the patient and they consent to proceed.   2. Personal history of adenomatous colon polyps. Surveillance  colonoscopy July 2019.

## 2013-07-30 NOTE — Patient Instructions (Addendum)
You have been scheduled for a flexible sigmoidoscopy. Please follow the written instructions given to you at your visit today. If you use inhalers (even only as needed), please bring them with you on the day of your procedure.  CC: [name], MD

## 2013-08-01 ENCOUNTER — Encounter: Payer: Self-pay | Admitting: Gastroenterology

## 2013-08-01 ENCOUNTER — Ambulatory Visit (AMBULATORY_SURGERY_CENTER): Payer: Medicare Other | Admitting: Gastroenterology

## 2013-08-01 VITALS — BP 113/72 | HR 46 | Temp 97.5°F | Resp 31 | Ht 70.0 in | Wt 193.0 lb

## 2013-08-01 DIAGNOSIS — I10 Essential (primary) hypertension: Secondary | ICD-10-CM | POA: Diagnosis not present

## 2013-08-01 DIAGNOSIS — K921 Melena: Secondary | ICD-10-CM

## 2013-08-01 DIAGNOSIS — E669 Obesity, unspecified: Secondary | ICD-10-CM | POA: Diagnosis not present

## 2013-08-01 DIAGNOSIS — I2510 Atherosclerotic heart disease of native coronary artery without angina pectoris: Secondary | ICD-10-CM | POA: Diagnosis not present

## 2013-08-01 DIAGNOSIS — K648 Other hemorrhoids: Secondary | ICD-10-CM | POA: Diagnosis not present

## 2013-08-01 DIAGNOSIS — R195 Other fecal abnormalities: Secondary | ICD-10-CM | POA: Diagnosis not present

## 2013-08-01 MED ORDER — SODIUM CHLORIDE 0.9 % IV SOLN: 500.0000 mL | INTRAVENOUS | Status: DC

## 2013-08-01 NOTE — Op Note (Signed)
[facility]  [address]. [place], [address]   FLEXIBLE SIGMOIDOSCOPY PROCEDURE REPORT  PATIENT: Alexander [name], Alexander [name]  MR#: [id number] BIRTHDATE: 1940-11-27 , 89  yrs. old GENDER: Male ENDOSCOPIST: Ladene Artist, MD, [facility] PROCEDURE DATE:  08/01/2013 PROCEDURE:   Proctosigmoidoscopy, diagnostoc and Hemorrhoidectomy via sclerosing ASA CLASS:   Class III INDICATIONS:hematochezia. MEDICATIONS: MAC sedation, administered by CRNA and propofol (Diprivan) 170mg  IV DESCRIPTION OF PROCEDURE:   After the risks benefits and alternatives of the procedure were thoroughly explained, informed consent was obtained. DRE revealed moderate external hemorrhoids. The LB PCF-Q180 [id number]  endoscope was introduced through the anus and advanced to the descending colon , limited by No adverse events experienced.   The quality of the prep was fair .  The instrument was then slowly withdrawn as the mucosa was fully examined.    COLON FINDINGS: Moderate diverticulosis was noted in the descending colon and sigmoid colon.  The colonic mucosa otherise appeared normal in the descending, sigmoid and  rectum.  Retroflexed views revealed large internal hemorrhoids.  2 cc of 23.4 % saline was injected into the internal hemorrhoids well above the dentate line. The scope was then withdrawn from the patient and the procedure terminated.  COMPLICATIONS: There were no complications.  ENDOSCOPIC IMPRESSION: 1.   Moderate diverticulosis in the descending colon and sigmoid colon 2.   Large internal hemorrhoids, moderate external hemorrhoids.  RECOMMENDATIONS: 1.  continue current meds   eSigned:  Ladene Artist, MD, [facility] 08/01/2013 10:27 AM

## 2013-08-01 NOTE — Progress Notes (Signed)
Called to room to assist during endoscopic procedure.  Patient ID and intended procedure confirmed with present staff. Received instructions for my participation in the procedure from the performing physician.  

## 2013-08-01 NOTE — Patient Instructions (Signed)
YOU HAD AN ENDOSCOPIC PROCEDURE TODAY AT THE  ENDOSCOPY CENTER: Refer to the procedure report that was given to you for any specific questions about what was found during the examination.  If the procedure report does not answer your questions, please call your gastroenterologist to clarify.  If you requested that your care partner not be given the details of your procedure findings, then the procedure report has been included in a sealed envelope for you to review at your convenience later.  YOU SHOULD EXPECT: Some feelings of bloating in the abdomen. Passage of more gas than usual.  Walking can help get rid of the air that was put into your GI tract during the procedure and reduce the bloating. If you had a lower endoscopy (such as a colonoscopy or flexible sigmoidoscopy) you may notice spotting of blood in your stool or on the toilet paper. If you underwent a bowel prep for your procedure, then you may not have a normal bowel movement for a few days.  DIET: Your first meal following the procedure should be a light meal and then it is ok to progress to your normal diet.  A half-sandwich or bowl of soup is an example of a good first meal.  Heavy or fried foods are harder to digest and may make you feel nauseous or bloated.  Likewise meals heavy in dairy and vegetables can cause extra gas to form and this can also increase the bloating.  Drink plenty of fluids but you should avoid alcoholic beverages for 24 hours.  ACTIVITY: Your care partner should take you home directly after the procedure.  You should plan to take it easy, moving slowly for the rest of the day.  You can resume normal activity the day after the procedure however you should NOT DRIVE or use heavy machinery for 24 hours (because of the sedation medicines used during the test).    SYMPTOMS TO REPORT IMMEDIATELY: A gastroenterologist can be reached at any hour.  During normal business hours, 8:30 AM to 5:00 PM Monday through Friday,  call [phone].  After hours and on weekends, please call the GI answering service at [phone] who will take a message and have the physician on call contact you.   Following lower endoscopy (colonoscopy or flexible sigmoidoscopy):  Excessive amounts of blood in the stool  Significant tenderness or worsening of abdominal pains  Swelling of the abdomen that is new, acute  Fever of 100F or higher  FOLLOW UP: If any biopsies were taken you will be contacted by phone or by letter within the next 1-3 weeks.  Call your gastroenterologist if you have not heard about the biopsies in 3 weeks.  Our staff will call the home number listed on your records the next business day following your procedure to check on you and address any questions or concerns that you may have at that time regarding the information given to you following your procedure. This is a courtesy call and so if there is no answer at the home number and we have not heard from you through the emergency physician on call, we will assume that you have returned to your regular daily activities without incident.  SIGNATURES/CONFIDENTIALITY: You and/or your care partner have signed paperwork which will be entered into your electronic medical record.  These signatures attest to the fact that that the information above on your After Visit Summary has been reviewed and is understood.  Full responsibility of the confidentiality of this   discharge information lies with you and/or your care-partner.  Recommendations Continue current medications

## 2013-08-01 NOTE — Progress Notes (Signed)
Report to PACU, RN, vss, BBS= Clear.  

## 2013-08-04 ENCOUNTER — Telehealth: Payer: Self-pay | Admitting: *Deleted

## 2013-08-04 NOTE — Telephone Encounter (Signed)
  Follow up Call-  Call back number 08/01/2013 07/16/2012  Post procedure Call Back phone  # [phone]  Permission to leave phone message Yes Yes     Patient questions:  Do you have a fever, pain , or abdominal swelling? No. Pain Score  0 *  Have you tolerated food without any problems? Yes.    Have you been able to return to your normal activities? Yes.    Do you have any questions about your discharge instructions: Diet   No. Medications  No. Follow up visit  No.  Do you have questions or concerns about your Care? Yes.    Actions: * If pain score is 4 or above: No action needed, pain <4.  Pt. States that he was a little sore after hemorrhoid injection.  Reports that he is less sore today and has not really had any major discomfort. He is afebrile.  He wonders if it would be OK to use his hemorrhoid medication in the  Future. I advised him that he probably won't need to use it after injection, but if the does would wait several days.  Also, advised him if pain became intense or if he became febrile to call office.  He voiced understanding.

## 2013-08-06 ENCOUNTER — Telehealth: Payer: Self-pay | Admitting: Hematology & Oncology

## 2013-08-06 ENCOUNTER — Ambulatory Visit: Payer: Medicare Other | Admitting: Internal Medicine

## 2013-08-06 NOTE — Telephone Encounter (Signed)
Pt moved 9-4 to 9-18 he said he had to go to [place]. There is no other day open for MD appointment. I left RN message to inform MD

## 2013-08-11 ENCOUNTER — Other Ambulatory Visit: Payer: Medicare Other

## 2013-08-15 ENCOUNTER — Encounter: Payer: Self-pay | Admitting: Internal Medicine

## 2013-08-15 MED ORDER — TRIAMCINOLONE ACETONIDE 0.5 % EX CREA: 1.0000 "application " | TOPICAL_CREAM | Freq: Two times a day (BID) | CUTANEOUS | Status: DC

## 2013-08-18 ENCOUNTER — Ambulatory Visit: Payer: Medicare Other | Admitting: Internal Medicine

## 2013-09-02 DIAGNOSIS — R209 Unspecified disturbances of skin sensation: Secondary | ICD-10-CM | POA: Diagnosis not present

## 2013-09-02 DIAGNOSIS — D235 Other benign neoplasm of skin of trunk: Secondary | ICD-10-CM | POA: Diagnosis not present

## 2013-09-02 DIAGNOSIS — L821 Other seborrheic keratosis: Secondary | ICD-10-CM | POA: Diagnosis not present

## 2013-09-05 ENCOUNTER — Other Ambulatory Visit: Payer: Medicare Other | Admitting: Lab

## 2013-09-05 ENCOUNTER — Ambulatory Visit: Payer: Medicare Other | Admitting: Hematology & Oncology

## 2013-09-09 ENCOUNTER — Encounter: Payer: Self-pay | Admitting: Internal Medicine

## 2013-09-11 ENCOUNTER — Encounter: Payer: Self-pay | Admitting: Internal Medicine

## 2013-09-19 ENCOUNTER — Encounter: Payer: Self-pay | Admitting: Hematology & Oncology

## 2013-09-19 ENCOUNTER — Other Ambulatory Visit (HOSPITAL_BASED_OUTPATIENT_CLINIC_OR_DEPARTMENT_OTHER): Payer: Medicare Other | Admitting: Lab

## 2013-09-19 ENCOUNTER — Ambulatory Visit (HOSPITAL_BASED_OUTPATIENT_CLINIC_OR_DEPARTMENT_OTHER): Payer: Medicare Other | Admitting: Hematology & Oncology

## 2013-09-19 ENCOUNTER — Ambulatory Visit: Payer: Medicare Other

## 2013-09-19 VITALS — BP 118/69 | HR 50 | Temp 98.0°F | Resp 18 | Ht 69.0 in | Wt 190.0 lb

## 2013-09-19 DIAGNOSIS — D751 Secondary polycythemia: Secondary | ICD-10-CM

## 2013-09-19 DIAGNOSIS — D45 Polycythemia vera: Secondary | ICD-10-CM

## 2013-09-19 DIAGNOSIS — R12 Heartburn: Secondary | ICD-10-CM

## 2013-09-19 LAB — CBC WITH DIFFERENTIAL (CANCER CENTER ONLY)
BASO#: 0 10*3/uL (ref 0.0–0.2)
BASO%: 0.4 % (ref 0.0–2.0)
EOS%: 3.6 % (ref 0.0–7.0)
Eosinophils Absolute: 0.3 10*3/uL (ref 0.0–0.5)
HCT: 44.8 % (ref 38.7–49.9)
HGB: 14.9 g/dL (ref 13.0–17.1)
LYMPH#: 2 10*3/uL (ref 0.9–3.3)
LYMPH%: 24.1 % (ref 14.0–48.0)
MCH: 30 pg (ref 28.0–33.4)
MCHC: 33.3 g/dL (ref 32.0–35.9)
MCV: 90 fL (ref 82–98)
MONO#: 1.1 10*3/uL — ABNORMAL HIGH (ref 0.1–0.9)
MONO%: 13.5 % — ABNORMAL HIGH (ref 0.0–13.0)
NEUT#: 4.9 10*3/uL (ref 1.5–6.5)
NEUT%: 58.4 % (ref 40.0–80.0)
Platelets: 193 10*3/uL (ref 145–400)
RBC: 4.97 10*6/uL (ref 4.20–5.70)
RDW: 14.9 % (ref 11.1–15.7)
WBC: 8.5 10*3/uL (ref 4.0–10.0)

## 2013-09-19 NOTE — Progress Notes (Signed)
No phlebotomy today d/t hct < 45. dph

## 2013-09-20 DIAGNOSIS — Z23 Encounter for immunization: Secondary | ICD-10-CM | POA: Diagnosis not present

## 2013-09-22 LAB — IRON AND TIBC CHCC
%SAT: 13 % — ABNORMAL LOW (ref 20–55)
Iron: 43 ug/dL (ref 42–163)
TIBC: 321 ug/dL (ref 202–409)
UIBC: 278 ug/dL (ref 117–376)

## 2013-09-22 LAB — FERRITIN CHCC: Ferritin: 20 ng/ml — ABNORMAL LOW (ref 22–316)

## 2013-09-22 NOTE — Progress Notes (Signed)
Hematology and Oncology Follow Up Visit  Alexander [name] [id number] Jan 13, 1940 73 y.o. 09/22/2013   Principle Diagnosis:  Polycythemia vera-JAK2 negative  Current Therapy:    Phlebotomy to maintain hematocrit below 45%  Aspirin 81 mg by mouth daily     Interim History:  [name] is in for a followup. He feels well. He had a good summer. He has been exercising. He and his wife both do a lot of swimming.  He's not had any chest pain issues. He had his back in the spring time. He was evaluated extensively. . It sounds like angina. He has been seen by cardiology. He has had cardiac cath. He did have a CT scan done. There was a concern of a pulmonary nodule in the right lung. This did not show up on a CT scan.  He and his wife have done some foul around the country. They have had company coming from other parts of the country and also from [place]. He has had no change in his medications.  On his cardiac cath, he had a 30% left main. He had ectasia of his main vessels. No obstructive disease noted. He had a ejection fraction of 55-65%. It was recommended that he just had medical management.   Medications: Current outpatient prescriptions:allopurinol (ZYLOPRIM) 100 MG tablet, Take 1 tablet (100 mg  total) by mouth daily., Disp: 90 tablet, Rfl: 3;  amLODipine (NORVASC) 5 MG tablet, Take 1 tablet (5 mg total)  by mouth daily., Disp: 90 tablet, Rfl: 3;  aspirin EC 81 MG tablet, Take 1 tablet (81 mg total) by mouth daily., Disp: , Rfl: ;  BENICAR 20 MG tablet, Take 1 tablet (20 mg total) by mouth daily., Disp: 90 tablet, Rfl: 3 carvedilol (COREG) 3.125 MG tablet, Take 3.125 mg by mouth 2 (two) times daily with a meal. TAKES 1/2 OF A 3.125 BID   D/T BRADYCARDIA AND DIZZINESS, Disp: , Rfl: ;  Coenzyme Q10 300 MG CAPS, Take 1 capsule by mouth daily. , Disp: , Rfl: ;  Efinaconazole (JUBLIA) 10 % SOLN, Apply 1 application topically daily., Disp: 8 mL, Rfl: 5 nitroGLYCERIN (NITROSTAT) 0.4 MG SL  tablet, Place 1 tablet (0.4 mg total) under the tongue every 5 (five) minutes as needed for chest pain (up to 3 doses)., Disp: 25 tablet, Rfl: 3;  pantoprazole (PROTONIX) 40 MG tablet, Take 1 tablet (40 mg total) by mouth daily., Disp: 90 tablet, Rfl: 1;  PROCTOZONE-HC 2.5 % rectal cream, Apply rectally two times  daily, Disp: 60 g, Rfl: 3 simvastatin (ZOCOR) 20 MG tablet, Take 1 tablet (20 mg total) by mouth daily., Disp: 90 tablet, Rfl: 3;  triamcinolone cream (KENALOG) 0.5 %, Apply 1 application topically 2 (two) times daily., Disp: 15 g, Rfl: 0  Allergies:  Allergies  Allergen Reactions  . Metformin And Related Diarrhea    Past Medical History, Surgical history, Social history, and Family History were reviewed and updated.  Review of Systems: As above  Physical Exam:  height is 5\' 9"  (1.753 m) and weight is 190 lb (86.183 kg). His oral temperature is 98 F (36.7 C). His blood pressure is 118/69 and his pulse is 50. His respiration is 18.   Well-developed and well-nourished gentleman. His lungs are clear. Cardiac exam regular in rhythm. Abdomen is soft. Has good bowel sounds. There is no fluid wave. There is no palpable liver or spleen tip. Extremities shows no clubbing cyanosis or edema. Skin exam no rashes. Neurological exam is nonfocal.  Lab Results  Component Value Date   WBC 8.5 09/19/2013   HGB 14.9 09/19/2013   HCT 44.8 09/19/2013   MCV 90 09/19/2013   PLT 193 09/19/2013     Chemistry      Component Value Date/Time   NA 139 07/10/2013 0857   K 4.9 07/10/2013 0857   CL 104 07/10/2013 0857   CO2 30 07/10/2013 0857   BUN 14 07/10/2013 0857   CREATININE 1.3 07/10/2013 0857   CREATININE 1.19 08/09/2010 1159      Component Value Date/Time   CALCIUM 9.7 07/10/2013 0857   ALKPHOS 80 07/10/2013 0857   AST 15 07/10/2013 0857   ALT 16 07/10/2013 0857   BILITOT 1.2 07/10/2013 0857      Ferritin is 20. Iron saturation 13%. Total iron is a 43.   Impression and Plan: Alexander [name] is 73 year old  gentleman. He has polycythemia. We have done a good job in [place] him and keeping his blood counts adequate. I will go ahead and phlebotomize him now. His hematocrit is 44.8. He is close  to 45% that we probably can phlebotomize him.   For now, I will plan to have come back in another 3 months.   [name], MD 9/21/20156:08 PM

## 2013-10-09 DIAGNOSIS — E11329 Type 2 diabetes mellitus with mild nonproliferative diabetic retinopathy without macular edema: Secondary | ICD-10-CM | POA: Diagnosis not present

## 2013-10-17 ENCOUNTER — Encounter: Payer: Self-pay | Admitting: Internal Medicine

## 2013-10-21 MED ORDER — METOPROLOL TARTRATE 25 MG PO TABS: 12.5000 mg | ORAL_TABLET | Freq: Two times a day (BID) | ORAL | Status: DC

## 2013-10-28 ENCOUNTER — Ambulatory Visit (INDEPENDENT_AMBULATORY_CARE_PROVIDER_SITE_OTHER): Payer: Medicare Other | Admitting: Podiatry

## 2013-10-28 ENCOUNTER — Encounter: Payer: Self-pay | Admitting: Podiatry

## 2013-10-28 DIAGNOSIS — B351 Tinea unguium: Secondary | ICD-10-CM

## 2013-10-28 DIAGNOSIS — M79676 Pain in unspecified toe(s): Secondary | ICD-10-CM | POA: Diagnosis not present

## 2013-10-28 NOTE — Progress Notes (Signed)
Presents today chief complaint of painful elongated toenails.  Objective: Pulses are palpable bilateral nails are thick, yellow dystrophic onychomycosis and painful palpation.   Assessment: Onychomycosis with pain in limb.  Plan: Treatment of nails in thickness and length as covered service secondary to pain.  

## 2013-10-30 ENCOUNTER — Encounter: Payer: Self-pay | Admitting: Internal Medicine

## 2013-10-31 MED ORDER — HYDROCORTISONE ACETATE 25 MG RE SUPP: 25.0000 mg | Freq: Two times a day (BID) | RECTAL | Status: DC

## 2013-11-07 ENCOUNTER — Encounter: Payer: Self-pay | Admitting: Internal Medicine

## 2013-11-07 ENCOUNTER — Other Ambulatory Visit: Payer: Self-pay | Admitting: *Deleted

## 2013-11-07 MED ORDER — PANTOPRAZOLE SODIUM 40 MG PO TBEC: 40.0000 mg | DELAYED_RELEASE_TABLET | Freq: Every day | ORAL | Status: DC

## 2013-11-07 MED ORDER — FREESTYLE LANCETS MISC: Status: DC

## 2013-11-11 ENCOUNTER — Other Ambulatory Visit (INDEPENDENT_AMBULATORY_CARE_PROVIDER_SITE_OTHER): Payer: Medicare Other

## 2013-11-11 DIAGNOSIS — R739 Hyperglycemia, unspecified: Secondary | ICD-10-CM | POA: Diagnosis not present

## 2013-11-11 DIAGNOSIS — I10 Essential (primary) hypertension: Secondary | ICD-10-CM | POA: Diagnosis not present

## 2013-11-11 LAB — BASIC METABOLIC PANEL
BUN: 24 mg/dL — ABNORMAL HIGH (ref 6–23)
CHLORIDE: 106 meq/L (ref 96–112)
CO2: 30 mEq/L (ref 19–32)
Calcium: 9 mg/dL (ref 8.4–10.5)
Creatinine, Ser: 1.2 mg/dL (ref 0.4–1.5)
GFR: 64.18 mL/min (ref >60.00)
Glucose, Bld: 133 mg/dL — ABNORMAL HIGH (ref 70–99)
POTASSIUM: 4.5 meq/L (ref 3.5–5.1)
Sodium: 140 mEq/L (ref 135–145)

## 2013-11-11 LAB — HEMOGLOBIN A1C: Hgb A1c MFr Bld: 6.2 % (ref 4.6–6.5)

## 2013-11-13 ENCOUNTER — Encounter: Payer: Self-pay | Admitting: Internal Medicine

## 2013-11-13 MED ORDER — AZELASTINE HCL 0.1 % NA SOLN: 1.0000 | Freq: Two times a day (BID) | NASAL | Status: DC

## 2013-11-17 ENCOUNTER — Encounter: Payer: Self-pay | Admitting: Internal Medicine

## 2013-11-17 ENCOUNTER — Ambulatory Visit (INDEPENDENT_AMBULATORY_CARE_PROVIDER_SITE_OTHER): Payer: Medicare Other | Admitting: Internal Medicine

## 2013-11-17 VITALS — BP 140/84 | HR 72 | Temp 98.7°F | Ht 69.0 in | Wt 188.0 lb

## 2013-11-17 DIAGNOSIS — R7303 Prediabetes: Secondary | ICD-10-CM

## 2013-11-17 DIAGNOSIS — I2510 Atherosclerotic heart disease of native coronary artery without angina pectoris: Secondary | ICD-10-CM | POA: Diagnosis not present

## 2013-11-17 DIAGNOSIS — R7309 Other abnormal glucose: Secondary | ICD-10-CM | POA: Diagnosis not present

## 2013-11-17 DIAGNOSIS — I10 Essential (primary) hypertension: Secondary | ICD-10-CM

## 2013-11-17 DIAGNOSIS — I200 Unstable angina: Secondary | ICD-10-CM

## 2013-11-17 MED ORDER — PRAVASTATIN SODIUM 20 MG PO TABS: 20.0000 mg | ORAL_TABLET | Freq: Every day | ORAL | Status: DC

## 2013-11-17 NOTE — Assessment & Plan Note (Signed)
Patient could not tolerate simvastatin.  Switch to pravastatin 20 mg.  Follow up with cardiology.

## 2013-11-17 NOTE — Progress Notes (Signed)
Subjective:    Patient ID: Alexander [name], male    DOB: 1940-11-22, 73 y.o.   MRN: [id number]  HPI  73 year old white male with history of nonobstructive coronary artery disease, hypertension and abnormal glucose for follow-up. Since mid 2015 patient followed by cardiology. He was experiencing fatigue with addition of carvedilol.  Over the last couple of months patient decided to stop all of his medications. Patient reports his fatigue has significantly improved. Over the last month patient instructed to restart Benicar at 20 mg once daily. He has been monitoring his blood pressure at home. His systolic blood pressure ranges between 115 and 130.  His diastolic blood pressure is less than 80. Patient frequently experiences asymptomatic bradycardia.  His heart rate is often in the mid 40s at home.  He also stopped simvastatin.  Abnormal glucose - A1c is stable.  He stays active and follows healthy diet.  Review of Systems Negative for chest pain or shortness of breath.    Past Medical History  Diagnosis Date  . GERD (gastroesophageal reflux disease)   . Hypertension   . Microalbuminuria   . Lumbar back pain   . Hematuria, microscopic   . Hyperglycemia   . Diverticulosis of colon   . Hyperlipidemia   . Gout   . Tubular adenoma of colon 07/2012  . GI bleed     secondary to Aspirin  . Polycythemia rubra vera   . Hemorrhoids   . Arthritis     "minor; shoulders primarily" (05/20/2013)  . CAD (coronary artery disease)     a. 05/2013 - Chest pain/unstable angina and dynamic EKG changes showing cath showing atherosclerotic coronary artery disease manifested as diffuse ectasia, normal EF.  [name] Overweight(278.02)   . Pulmonary nodule     a. 05/2013 - seen on CXR.    History   Social History  . Marital Status: Married    Spouse Name: N/A    Number of Children: 3  . Years of Education: N/A   Occupational History  . retired Chief Financial Officer    Social History Main Topics  . Smoking  status: Never Smoker   . Smokeless tobacco: Never Used     Comment: smoked for 3 months only  . Alcohol Use: No  . Drug Use: No  . Sexual Activity:    Partners: Female   Other Topics Concern  . Not on file   Social History Narrative   Daughter, [name] (pediatrician--lives in [place], [place])    Past Surgical History  Procedure Laterality Date  . Cataract extraction w/ intraocular lens  implant, bilateral Bilateral 2008  . Excisional hemorrhoidectomy  1970's  . Cardiac catheterization  05/20/2013  . Inguinal hernia repair Right ~ 2010  . Tonsillectomy and adenoidectomy  1940's  . Cystectomy  ~ 2000    " SEBACEOUS cyst removed from between shoulder blades"    Family History  Problem Relation Age of Onset  . Colon cancer Mother     died at 99, colorectal  . Heart disease Father 37    MI  . Diabetes Father   . Diabetes Mother   . Stomach cancer Maternal Grandfather     Allergies  Allergen Reactions  . Metformin And Related Diarrhea    Current Outpatient Prescriptions on File Prior to Visit  Medication Sig Dispense Refill  . allopurinol (ZYLOPRIM) 100 MG tablet Take 1 tablet (100 mg  total) by mouth daily. 90 tablet 3  . azelastine (ASTELIN) 0.1 % nasal spray Place 1  spray into both nostrils 2 (two) times daily. Use in each nostril as directed 90 mL 1  . BENICAR 20 MG tablet Take 1 tablet (20 mg total) by mouth daily. 90 tablet 3  . Coenzyme Q10 300 MG CAPS Take 1 capsule by mouth daily.     . Efinaconazole (JUBLIA) 10 % SOLN Apply 1 application topically daily. 8 mL 5  . hydrocortisone (ANUSOL-HC) 25 MG suppository Place 1 suppository (25 mg total) rectally 2 (two) times daily. 120 suppository 0  . Lancets (FREESTYLE) lancets Test 3 times a week 100 each 1  . nitroGLYCERIN (NITROSTAT) 0.4 MG SL tablet Place 1 tablet (0.4 mg total) under the tongue every 5 (five) minutes as needed for chest pain (up to 3 doses). 25 tablet 3  . PROCTOZONE-HC 2.5 % rectal cream Apply  rectally two times  daily 60 g 3  . triamcinolone cream (KENALOG) 0.5 % Apply 1 application topically 2 (two) times daily. 15 g 0   No current facility-administered medications on file prior to visit.    BP 140/84 mmHg  Pulse 72  Temp(Src) 98.7 F (37.1 C) (Oral)  Ht 5\' 9"  (1.753 m)  Wt 188 lb (85.276 kg)  BMI 27.75 kg/m2    Objective:   Physical Exam  Constitutional: He is oriented to person, place, and time. He appears well-developed and well-nourished. No distress.  Cardiovascular: Normal rate, regular rhythm and normal heart sounds.   No murmur heard. Pulmonary/Chest: Effort normal and breath sounds normal. He has no wheezes.  Musculoskeletal: He exhibits no edema.  Neurological: He is alert and oriented to person, place, and time. No cranial nerve deficit.  Psychiatric: He has a normal mood and affect. His behavior is normal.          Assessment & Plan:

## 2013-11-17 NOTE — Progress Notes (Signed)
Pre visit review using our clinic review tool, if applicable. No additional management support is needed unless otherwise documented below in the visit note. 

## 2013-11-17 NOTE — Patient Instructions (Signed)
Please complete the following lab tests before your next follow up appointment: BMET, A1c - 790.29 FLP, LFTs, CK - 272.4

## 2013-11-17 NOTE — Assessment & Plan Note (Signed)
Patient was experiencing significant fatigue with multiple antihypertensives. He discontinued all medications on his own. I suspect beta blocker was major contributor to fatigue symptoms. Patient restarted on Benicar 20 mg. His home blood pressure readings are stable. He has asymptomatic bradycardia. I encouraged patient to follow-up with cardiology  BP: 140/84 mmHg

## 2013-11-17 NOTE — Assessment & Plan Note (Signed)
Managed with diet and exercise. Lab Results  Component Value Date   HGBA1C 6.2 11/11/2013

## 2013-11-18 ENCOUNTER — Encounter: Payer: Self-pay | Admitting: Internal Medicine

## 2013-11-18 MED ORDER — ACCU-CHEK SOFTCLIX LANCETS MISC: Status: DC

## 2013-11-18 MED ORDER — GLUCOSE BLOOD VI STRP: ORAL_STRIP | Status: DC

## 2013-11-19 ENCOUNTER — Encounter: Payer: Self-pay | Admitting: Cardiology

## 2013-11-19 ENCOUNTER — Ambulatory Visit (INDEPENDENT_AMBULATORY_CARE_PROVIDER_SITE_OTHER): Payer: Medicare Other | Admitting: Cardiology

## 2013-11-19 VITALS — BP 142/86 | HR 58 | Ht 69.0 in | Wt 191.0 lb

## 2013-11-19 DIAGNOSIS — I2583 Coronary atherosclerosis due to lipid rich plaque: Secondary | ICD-10-CM

## 2013-11-19 DIAGNOSIS — E785 Hyperlipidemia, unspecified: Secondary | ICD-10-CM

## 2013-11-19 DIAGNOSIS — I200 Unstable angina: Secondary | ICD-10-CM | POA: Diagnosis not present

## 2013-11-19 DIAGNOSIS — I2510 Atherosclerotic heart disease of native coronary artery without angina pectoris: Secondary | ICD-10-CM

## 2013-11-19 DIAGNOSIS — D45 Polycythemia vera: Secondary | ICD-10-CM | POA: Diagnosis not present

## 2013-11-19 DIAGNOSIS — I10 Essential (primary) hypertension: Secondary | ICD-10-CM | POA: Diagnosis not present

## 2013-11-19 NOTE — Patient Instructions (Signed)
The current medical regimen is effective;  continue present plan and medications.  Please monitor your BP and let us know if it is consistently above 140/90.  If it is your Benicar will be increased to 40 mg a day.  Follow up in February 2016 with Dr Marlou Porch.

## 2013-11-19 NOTE — Progress Notes (Signed)
Cardiology Office Note   Date:  11/19/2013   ID:  Alexander [name], DOB [birth year], MRN [id number]  PCP:  [name], DO  Cardiologist:  Dr. Candee Furbish      History of Present Illness: Alexander [name] is a 73 y.o. male with a hx of polycythemia vera, HTN, GERD, HL, CKD, prior GI bleed.  He was admitted 5/19-5/20 with chest pain.  He initially had inferior STE on ECG when EMS arrived.  ECG changes had resolved upon arrival to the ED.  CEs remained normal.  LHC demonstrated atherosclerotic CAD manifested as diffuse ectasia but no significant obstructive disease was noted. Had 30% left main plaque.  EF was normal.  Med Rx was recommended.  He returns for follow up.   About 3 weeks ago went on vacation. Felt great when got home. Moved a piano. BP has been elevated past 2 days. Only med is benicar. No statin. He does not like to take carvedilol. He also did not like to take his statin. He has been prescribed pravastatin but has not started. Last LDL was excellent.  Studies:  - LHC (05/20/13):  Proximal left main 30%, mildly ectatic distal left main, proximal LAD with mild ectasia, proximal circumflex with moderate ectasia, proximal, mid and distal RCA with severe ectasia, EF 55-65%.    - Echo (10/2008):  EF 60-65%, Gr 1 DD  - Nuclear (12/2012):  Normal study, EF 59%   CXR (05/20/13): IMPRESSION: The nodular opacity at the right lung base does not correspond with a nipple marker and is concerning for a pulmonary nodule. Recommend further evaluation with a nonemergent CT of the chest.   Chest CT (06/05/13): IMPRESSION: 1. No suspicious lung nodule is seen. The area questioned by chest x-ray probably represents focal thickening along the major fissure adjacent to the right middle lobe anteriorly and laterally. 2. No mediastinal or hilar adenopathy.   Recent Labs: 05/20/2013: Pro B Natriuretic peptide (BNP) 22.4 07/10/2013: ALT 16; HDL Cholesterol by NMR 41.60; LDL (calc) 52; TSH  0.87 09/19/2013: Hemoglobin 14.9 11/11/2013: Creatinine 1.2; Potassium 4.5  Wt Readings from Last 3 Encounters:  11/19/13 191 lb (86.637 kg)  11/17/13 188 lb (85.276 kg)  09/19/13 190 lb (86.183 kg)     Past Medical History  Diagnosis Date  . GERD (gastroesophageal reflux disease)   . Hypertension   . Microalbuminuria   . Lumbar back pain   . Hematuria, microscopic   . Hyperglycemia   . Diverticulosis of colon   . Hyperlipidemia   . Gout   . Tubular adenoma of colon 07/2012  . GI bleed     secondary to Aspirin  . Polycythemia rubra vera   . Hemorrhoids   . Arthritis     "minor; shoulders primarily" (05/20/2013)  . CAD (coronary artery disease)     a. 05/2013 - Chest pain/unstable angina and dynamic EKG changes showing cath showing atherosclerotic coronary artery disease manifested as diffuse ectasia, normal EF.  [name] Overweight(278.02)   . Pulmonary nodule     a. 05/2013 - seen on CXR.    Current Outpatient Prescriptions  Medication Sig Dispense Refill  . ACCU-CHEK SOFTCLIX LANCETS lancets Test 3 times a week 100 each 5  . allopurinol (ZYLOPRIM) 100 MG tablet Take 1 tablet (100 mg  total) by mouth daily. 90 tablet 3  . azelastine (ASTELIN) 0.1 % nasal spray Place 1 spray into both nostrils 2 (two) times daily. Use in each nostril as directed 90 mL 1  .  BENICAR 20 MG tablet Take 1 tablet (20 mg total) by mouth daily. 90 tablet 3  . Coenzyme Q10 300 MG CAPS Take 1 capsule by mouth daily.     . Efinaconazole (JUBLIA) 10 % SOLN Apply 1 application topically daily. 8 mL 5  . glucose blood (ACCU-CHEK AVIVA) test strip Test 3 times a week 100 each 5  . hydrocortisone (ANUSOL-HC) 25 MG suppository Place 1 suppository (25 mg total) rectally 2 (two) times daily. 120 suppository 0  . nitroGLYCERIN (NITROSTAT) 0.4 MG SL tablet Place 1 tablet (0.4 mg total) under the tongue every 5 (five) minutes as needed for chest pain (up to 3 doses). 25 tablet 3  . PROCTOZONE-HC 2.5 % rectal cream Apply  rectally two times  daily 60 g 3  . triamcinolone cream (KENALOG) 0.5 % Apply 1 application topically 2 (two) times daily. 15 g 0  . pravastatin (PRAVACHOL) 20 MG tablet Take 1 tablet (20 mg total) by mouth daily. 90 tablet 1   No current facility-administered medications for this visit.    Allergies:   Metformin and related   Social History:  The patient  reports that he has never smoked. He has never used smokeless tobacco. He reports that he does not drink alcohol or use illicit drugs.   Family History:  The patient's family history includes Colon cancer in his mother; Diabetes in his father and mother; Heart disease (age of onset: 30) in his father; Stomach cancer in his maternal grandfather.   ROS:  Please see the history of present illness.      All other systems reviewed and negative.   PHYSICAL EXAM: VS:  BP 142/86 mmHg  Pulse 58  Ht 5\' 9"  (1.753 m)  Wt 191 lb (86.637 kg)  BMI 28.19 kg/m2 Well nourished, well developed, in no acute distress HEENT: normal Neck: no JVD Cardiac:  normal S1, S2; RRR; no murmur Lungs:  clear to auscultation bilaterally, no wheezing, rhonchi or rales Abd: soft, nontender, no hepatomegaly Ext: no edemaright wrist without hematoma or mass  Skin: warm and dry Neuro:  CNs 2-12 intact, no focal abnormalities noted  EKG:  Sinus brady, HR 48, NSSTTW changes     ASSESSMENT AND PLAN:  1. CAD (coronary artery disease): Diffuse ectasia noted on cardiac catheterization without significant obstructive disease. Continue medical therapy with aspirin,off beta blocker-bradycardia, felt poor-I'm fine with this, statin. He has had no further chest pain. 2. HYPERTENSION: Controlled. 3. HYPERLIPIDEMIA: Continue with start of pravastatin once medicine comes in the mail. This medication hopefully will be better tolerated. 4. POLYCYTHEMIA RUBRA VERA: Continue followup with hematology. 5. Pulmonary nodule: Followup chest CT without lung nodule. No further workup  planned. 6. Bradycardia: He is asymptomatic. Stay off beta blocker. Continue current therapy. 7. Disposition:  F/u in early February before strip to [place].    Signed, Candee Furbish, MD  11/19/2013 9:53 AM    [facility] [address], [place], [place]  [address] Phone: [phone]; Fax: [phone]

## 2013-12-08 ENCOUNTER — Ambulatory Visit: Payer: Medicare Other | Admitting: Cardiology

## 2013-12-11 ENCOUNTER — Encounter (HOSPITAL_COMMUNITY): Payer: Self-pay | Admitting: Cardiology

## 2013-12-17 ENCOUNTER — Encounter: Payer: Self-pay | Admitting: Internal Medicine

## 2013-12-17 ENCOUNTER — Encounter: Payer: Self-pay | Admitting: Cardiology

## 2013-12-17 DIAGNOSIS — R001 Bradycardia, unspecified: Secondary | ICD-10-CM

## 2013-12-19 ENCOUNTER — Other Ambulatory Visit (HOSPITAL_BASED_OUTPATIENT_CLINIC_OR_DEPARTMENT_OTHER): Payer: Medicare Other | Admitting: Lab

## 2013-12-19 ENCOUNTER — Encounter: Payer: Self-pay | Admitting: Hematology & Oncology

## 2013-12-19 ENCOUNTER — Ambulatory Visit (HOSPITAL_BASED_OUTPATIENT_CLINIC_OR_DEPARTMENT_OTHER): Payer: Medicare Other | Admitting: Hematology & Oncology

## 2013-12-19 ENCOUNTER — Ambulatory Visit (HOSPITAL_BASED_OUTPATIENT_CLINIC_OR_DEPARTMENT_OTHER): Payer: Medicare Other

## 2013-12-19 VITALS — BP 135/78 | HR 50 | Temp 98.1°F | Resp 18 | Ht 69.0 in | Wt 190.0 lb

## 2013-12-19 DIAGNOSIS — D45 Polycythemia vera: Secondary | ICD-10-CM | POA: Diagnosis not present

## 2013-12-19 DIAGNOSIS — I10 Essential (primary) hypertension: Secondary | ICD-10-CM

## 2013-12-19 LAB — IRON AND TIBC CHCC
%SAT: 16 % — ABNORMAL LOW (ref 20–55)
Iron: 55 ug/dL (ref 42–163)
TIBC: 336 ug/dL (ref 202–409)
UIBC: 281 ug/dL (ref 117–376)

## 2013-12-19 LAB — RETICULOCYTES (CHCC)
ABS Retic: 32.6 K/uL (ref 19.0–186.0)
RBC.: 5.44 MIL/uL (ref 4.22–5.81)
Retic Ct Pct: 0.6 % (ref 0.4–2.3)

## 2013-12-19 LAB — FERRITIN CHCC: Ferritin: 33 ng/ml (ref 22–316)

## 2013-12-19 LAB — CBC WITH DIFFERENTIAL (CANCER CENTER ONLY)
BASO#: 0 10*3/uL (ref 0.0–0.2)
BASO%: 0.2 % (ref 0.0–2.0)
EOS%: 2.6 % (ref 0.0–7.0)
Eosinophils Absolute: 0.3 10*3/uL (ref 0.0–0.5)
HEMATOCRIT: 49.9 % (ref 38.7–49.9)
HEMOGLOBIN: 16.7 g/dL (ref 13.0–17.1)
LYMPH#: 1.6 10*3/uL (ref 0.9–3.3)
LYMPH%: 16.3 % (ref 14.0–48.0)
MCH: 30.3 pg (ref 28.0–33.4)
MCHC: 33.5 g/dL (ref 32.0–35.9)
MCV: 90 fL (ref 82–98)
MONO#: 1.1 10*3/uL — AB (ref 0.1–0.9)
MONO%: 11.2 % (ref 0.0–13.0)
NEUT#: 6.9 10*3/uL — ABNORMAL HIGH (ref 1.5–6.5)
NEUT%: 69.7 % (ref 40.0–80.0)
Platelets: 211 10*3/uL (ref 145–400)
RBC: 5.52 10*6/uL (ref 4.20–5.70)
RDW: 14.2 % (ref 11.1–15.7)
WBC: 9.9 10*3/uL (ref 4.0–10.0)

## 2013-12-19 LAB — CMP (CANCER CENTER ONLY)
ALT(SGPT): 16 U/L (ref 10–47)
AST: 20 U/L (ref 11–38)
Albumin: 3.7 g/dL (ref 3.3–5.5)
Alkaline Phosphatase: 83 U/L (ref 26–84)
BUN, Bld: 18 mg/dL (ref 7–22)
CO2: 30 meq/L (ref 18–33)
Calcium: 9.5 mg/dL (ref 8.0–10.3)
Chloride: 103 meq/L (ref 98–108)
Creat: 1.6 mg/dL — ABNORMAL HIGH (ref 0.6–1.2)
Glucose, Bld: 90 mg/dL (ref 73–118)
Potassium: 4 meq/L (ref 3.3–4.7)
Sodium: 143 meq/L (ref 128–145)
Total Bilirubin: 0.8 mg/dL (ref 0.20–1.60)
Total Protein: 7.6 g/dL (ref 6.4–8.1)

## 2013-12-19 LAB — CHCC SATELLITE - SMEAR

## 2013-12-19 NOTE — Patient Instructions (Signed)

## 2013-12-19 NOTE — Progress Notes (Signed)
Alexander [name] presents today for phlebotomy per MD orders. Phlebotomy procedure started at 1230 and ended at 1238. 500 grams removed. Patient observed for 30 minutes after procedure without any incident. Patient tolerated procedure well. IV needle removed intact.

## 2013-12-22 ENCOUNTER — Ambulatory Visit: Payer: Medicare Other | Admitting: Lab

## 2013-12-22 ENCOUNTER — Telehealth: Payer: Self-pay | Admitting: Gastroenterology

## 2013-12-22 ENCOUNTER — Encounter: Payer: Self-pay | Admitting: Physician Assistant

## 2013-12-22 ENCOUNTER — Ambulatory Visit (INDEPENDENT_AMBULATORY_CARE_PROVIDER_SITE_OTHER): Payer: Medicare Other | Admitting: Physician Assistant

## 2013-12-22 VITALS — BP 100/60 | HR 64 | Ht 69.0 in | Wt 190.6 lb

## 2013-12-22 DIAGNOSIS — D45 Polycythemia vera: Secondary | ICD-10-CM | POA: Diagnosis not present

## 2013-12-22 DIAGNOSIS — I10 Essential (primary) hypertension: Secondary | ICD-10-CM | POA: Diagnosis not present

## 2013-12-22 DIAGNOSIS — I200 Unstable angina: Secondary | ICD-10-CM

## 2013-12-22 DIAGNOSIS — K644 Residual hemorrhoidal skin tags: Secondary | ICD-10-CM

## 2013-12-22 DIAGNOSIS — K648 Other hemorrhoids: Secondary | ICD-10-CM | POA: Diagnosis not present

## 2013-12-22 NOTE — Patient Instructions (Signed)
Use your suppositories twice daily for ten days.  You can purchase [employer]	[place] over the counter and use as directed.  We will call you once we hear from Dr. Fuller Plan. _________________________________________________________________________________________________________________________________________ About Hemorrhoids  Hemorrhoids are swollen veins in the lower rectum and anus.  Also called piles, hemorrhoids are a common problem.  Hemorrhoids may be internal (inside the rectum) or external (around the anus).  Internal Hemorrhoids  Internal hemorrhoids are often painless, but they rarely cause bleeding.  The internal veins may stretch and fall down (prolapse) through the anus to the outside of the body.  The veins may then become irritated and painful.  External Hemorrhoids  External hemorrhoids can be easily seen or felt around the anal opening.  They are under the skin around the anus.  When the swollen veins are scratched or broken by straining, rubbing or wiping they sometimes bleed.  How Hemorrhoids Occur  Veins in the rectum and around the anus tend to swell under pressure.  Hemorrhoids can result from increased pressure in the veins of your anus or rectum.  Some sources of pressure are:   Straining to have a bowel movement because of constipation  Waiting too long to have a bowel movement  Coughing and sneezing often  Sitting for extended periods of time, including on the toilet  Diarrhea  Obesity  Trauma or injury to the anus  Some liver diseases  Stress  Family history of hemorrhoids  Pregnancy  Pregnant women should try to avoid becoming constipated, because they are more likely to have hemorrhoids during pregnancy.  In the last trimester of pregnancy, the enlarged uterus may press on blood vessels and causes hemorrhoids.  In addition, the strain of childbirth sometimes causes hemorrhoids after the birth.  Symptoms of Hemorrhoids  Some symptoms of hemorrhoids  include:  Swelling and/or a tender lump around the anus  Itching, mild burning and bleeding around the anus  Painful bowel movements with or without constipation  Bright red blood covering the stool, on toilet paper or in the toilet bowel.   Symptoms usually go away within a few days.  Always talk to your doctor about any bleeding to make sure it is not from some other causes.  Diagnosing and Treating Hemorrhoids  Diagnosis is made by an examination by your healthcare provider.  Special test can be performed by your doctor.    Most cases of hemorrhoids can be treated with:  High-fiber diet: Eat more high-fiber foods, which help prevent constipation.  Ask for more detailed fiber information on types and sources of fiber from your healthcare provider.  Fluids: Drink plenty of water.  This helps soften bowel movements so they are easier to pass.  Sitz baths and cold packs: Sitting in lukewarm water two or three times a day for 15 minutes cleases the anal area and may relieve discomfort.  If the water is too hot, swelling around the anus will get worse.  Placing a cloth-covered ice pack on the anus for ten minutes four times a day can also help reduce selling.  Gently pushing a prolapsed hemorrhoid back inside after the bath or ice pack can be helpful.  Medications: For mild discomfort, your healthcare provider may suggest over-the-counter pain medication or prescribe a cream or ointment for topical use.  The cream may contain witch hazel, zinc oxide or petroleum jelly.  Medicated suppositories are also a treatment option.  Always consult your doctor before applying medications or creams.  Procedures and surgeries: There  are also a number of procedures and surgeries to shrink or remove hemorrhoids in more serious cases.  Talk to your physician about these options.  You can often prevent hemorrhoids or keep them from becoming worse by maintaining a healthy lifestyle.  Eat a fiber-rich diet of  fruits, vegetables and whole grains.  Also, drink plenty of water and exercise regularly.   2007, [employer] Doc.30

## 2013-12-22 NOTE — Telephone Encounter (Signed)
Left message for pt to call back  °

## 2013-12-22 NOTE — Telephone Encounter (Signed)
Per Dr Marlou Porch - pt to be scheduled for 30 day event monitor DX Bradycardia.

## 2013-12-22 NOTE — Progress Notes (Signed)
Hematology and Oncology Follow Up Visit  Alexander [name] [id number] 1940-10-07 73 y.o. 12/22/2013   Principle Diagnosis:  Polycythemia vera-JAK2 negative  Current Therapy:    Phlebotomy to maintain hematocrit below 45%  Aspirin 81 mg by mouth daily     Interim History:  [name] is in for a followup. He feels well. He had a good fall. He has wife does had a nice [place] season.   He has been exercising. He and his wife both do a lot of swimming.  There's been no problems with fever. He's had no change in bowel or bladder habits. He's had no rashes. There's been no leg swelling. He's had no chest pain. He's had no palpitations.  Overall, his performance status is ECOG 1.    Medications: Current outpatient prescriptions: ACCU-CHEK SOFTCLIX LANCETS lancets, Test 3 times a week, Disp: 100 each, Rfl: 5;  allopurinol (ZYLOPRIM) 100 MG tablet, Take 1 tablet (100 mg  total) by mouth daily., Disp: 90 tablet, Rfl: 3;  BENICAR 20 MG tablet, Take 1 tablet (20 mg total) by mouth daily., Disp: 90 tablet, Rfl: 3;  glucose blood (ACCU-CHEK AVIVA) test strip, Test 3 times a week, Disp: 100 each, Rfl: 5 pantoprazole (PROTONIX) 40 MG tablet, Take 40 mg by mouth daily., Disp: , Rfl: ;  pravastatin (PRAVACHOL) 20 MG tablet, Take 1 tablet (20 mg total) by mouth daily., Disp: 90 tablet, Rfl: 1;  triamcinolone cream (KENALOG) 0.5 %, Apply 1 application topically 2 (two) times daily., Disp: 15 g, Rfl: 0;  azelastine (ASTELIN) 0.1 % nasal spray, Place 1 spray into both nostrils 2 (two) times daily. Use in each nostril as directed, Disp: 90 mL, Rfl: 1 Coenzyme Q10 300 MG CAPS, Take 1 capsule by mouth daily. , Disp: , Rfl: ;  Efinaconazole (JUBLIA) 10 % SOLN, Apply 1 application topically daily., Disp: 8 mL, Rfl: 5;  hydrocortisone (ANUSOL-HC) 25 MG suppository, Place 1 suppository (25 mg total) rectally 2 (two) times daily., Disp: 120 suppository, Rfl: 0 nitroGLYCERIN (NITROSTAT) 0.4 MG SL tablet, Place 1  tablet (0.4 mg total) under the tongue every 5 (five) minutes as needed for chest pain (up to 3 doses)., Disp: 25 tablet, Rfl: 3;  PROCTOZONE-HC 2.5 % rectal cream, Apply rectally two times  daily, Disp: 60 g, Rfl: 3  Allergies:  Allergies  Allergen Reactions  . Metformin And Related Diarrhea    Past Medical History, Surgical history, Social history, and Family History were reviewed and updated.  Review of Systems: As above  Physical Exam:  height is 5\' 9"  (1.753 m) and weight is 190 lb (86.183 kg). His oral temperature is 98.1 F (36.7 C). His blood pressure is 135/78 and his pulse is 50. His respiration is 18.   Well-developed and well-nourished gentleman. His lungs are clear. Cardiac exam regular rate and rhythm with no murmurs, rubs or bruits.. Abdomen is soft. Has good bowel sounds. There is no fluid wave. There is no palpable liver or spleen tip. Extremities shows no clubbing cyanosis or edema. Skin exam no rashes. Neurological exam is nonfocal.  Lab Results  Component Value Date   WBC 9.9 12/19/2013   HGB 16.7 12/19/2013   HCT 49.9 12/19/2013   MCV 90 12/19/2013   PLT 211 12/19/2013     Chemistry      Component Value Date/Time   NA 143 12/19/2013 1037   NA 140 11/11/2013 1014   K 4.0 12/19/2013 1037   K 4.5 11/11/2013 1014  CL 103 12/19/2013 1037   CL 106 11/11/2013 1014   CO2 30 12/19/2013 1037   CO2 30 11/11/2013 1014   BUN 18 12/19/2013 1037   BUN 24* 11/11/2013 1014   CREATININE 1.6* 12/19/2013 1037   CREATININE 1.2 11/11/2013 1014      Component Value Date/Time   CALCIUM 9.5 12/19/2013 1037   CALCIUM 9.0 11/11/2013 1014   ALKPHOS 83 12/19/2013 1037   ALKPHOS 80 07/10/2013 0857   AST 20 12/19/2013 1037   AST 15 07/10/2013 0857   ALT 16 12/19/2013 1037   ALT 16 07/10/2013 0857   BILITOT 0.80 12/19/2013 1037   BILITOT 1.2 07/10/2013 0857      Ferritin is 33. Iron saturation 16%. Total iron is 55.   Impression and Plan: Alexander [name] is 73 year old  gentleman. He has polycythemia. We have done a good job in Alexander [name] him and keeping his blood counts adequate. I will go ahead and phlebotomize him now. His hematocrit is almost 50%.    For now, I will plan to have come back in another 6-8 weeks   [name], MD 12/21/20156:44 PM

## 2013-12-22 NOTE — Progress Notes (Signed)
Patient ID: Alexander [name], male   DOB: [birth year], 73 y.o.   MRN: [id number]     History of Present Illness:   Areas a pleasant 73 year old male who has been followed in this office for intermittent rectal bleeding. He has a history of polycythemia vera and undergoes phlebotomies. He had a colonoscopy in June 2014 that revealed moderate size internal hemorrhoids, moderate left colon diverticulosis and 3 small adenomatous colon polyps. He was seen in July at which time he had injection therapy for his hemorrhoids. He did well for a period of time but yesterday had a bowel movement followed by blood dripping in the commode. He had 2 subsequent formed bowel movements yesterday that had no blood. This morning after breakfast, he had a bowel movement and again had some blood dripping in the commode. He has had another bowel movement since then with no blood. He does have the sensation of incomplete evacuation and some anal seepage. He has no abdominal pain.   Past Medical History  Diagnosis Date  . GERD (gastroesophageal reflux disease)   . Hypertension   . Microalbuminuria   . Lumbar back pain   . Hematuria, microscopic   . Hyperglycemia   . Diverticulosis of colon   . Hyperlipidemia   . Gout   . Tubular adenoma of colon 07/2012  . GI bleed     secondary to Aspirin  . Polycythemia rubra vera   . Hemorrhoids   . Arthritis     "minor; shoulders primarily" (05/20/2013)  . CAD (coronary artery disease)     a. 05/2013 - Chest pain/unstable angina and dynamic EKG changes showing cath showing atherosclerotic coronary artery disease manifested as diffuse ectasia, normal EF.  [name] Overweight(278.02)   . Pulmonary nodule     a. 05/2013 - seen on CXR.    Past Surgical History  Procedure Laterality Date  . Cataract extraction w/ intraocular lens  implant, bilateral Bilateral 2008  . Excisional hemorrhoidectomy  1970's  . Cardiac catheterization  05/20/2013  . Inguinal hernia repair Right ~ 2010  .  Tonsillectomy and adenoidectomy  1940's  . Cystectomy  ~ 2000    " SEBACEOUS cyst removed from between shoulder blades"  . Left heart catheterization with coronary angiogram N/A 05/20/2013    Procedure: LEFT HEART CATHETERIZATION WITH CORONARY ANGIOGRAM;  Surgeon: Peter M Martinique, MD;  Location: [facility] CATH LAB;  Service: Cardiovascular;  Laterality: N/A;   Family History  Problem Relation Age of Onset  . Colon cancer Mother     died at 61, colorectal  . Heart disease Father 77    MI  . Diabetes Father   . Diabetes Mother   . Stomach cancer Maternal Grandfather    History  Substance Use Topics  . Smoking status: Never Smoker   . Smokeless tobacco: Never Used     Comment: smoked for 3 months only  . Alcohol Use: No   Current Outpatient Prescriptions  Medication Sig Dispense Refill  . ACCU-CHEK SOFTCLIX LANCETS lancets Test 3 times a week 100 each 5  . allopurinol (ZYLOPRIM) 100 MG tablet Take 1 tablet (100 mg  total) by mouth daily. 90 tablet 3  . azelastine (ASTELIN) 0.1 % nasal spray Place 1 spray into both nostrils 2 (two) times daily. Use in each nostril as directed 90 mL 1  . BENICAR 20 MG tablet Take 1 tablet (20 mg total) by mouth daily. 90 tablet 3  . Coenzyme Q10 300 MG CAPS Take 1 capsule by  mouth daily.     . Efinaconazole (JUBLIA) 10 % SOLN Apply 1 application topically daily. 8 mL 5  . glucose blood (ACCU-CHEK AVIVA) test strip Test 3 times a week 100 each 5  . hydrocortisone (ANUSOL-HC) 25 MG suppository Place 1 suppository (25 mg total) rectally 2 (two) times daily. 120 suppository 0  . nitroGLYCERIN (NITROSTAT) 0.4 MG SL tablet Place 1 tablet (0.4 mg total) under the tongue every 5 (five) minutes as needed for chest pain (up to 3 doses). 25 tablet 3  . pantoprazole (PROTONIX) 40 MG tablet Take 40 mg by mouth daily.    . pravastatin (PRAVACHOL) 20 MG tablet Take 1 tablet (20 mg total) by mouth daily. 90 tablet 1  . PROCTOZONE-HC 2.5 % rectal cream Apply rectally two times   daily 60 g 3  . triamcinolone cream (KENALOG) 0.5 % Apply 1 application topically 2 (two) times daily. 15 g 0   No current facility-administered medications for this visit.   Allergies  Allergen Reactions  . Metformin And Related Diarrhea      Review of Systems: Gen: Denies any fever, chills, sweats, anorexia, fatigue, weakness, malaise, weight loss, and sleep disorder CV: Denies chest pain, angina, palpitations, syncope, orthopnea, PND, peripheral edema, and claudication. Resp: Denies dyspnea at rest, dyspnea with exercise, cough, sputum, wheezing, coughing up blood, and pleurisy. GI: Denies vomiting blood, jaundice, and fecal incontinence.   Denies dysphagia or odynophagia. GU : Denies urinary burning, blood in urine, urinary frequency, urinary hesitancy, nocturnal urination, and urinary incontinence. MS: Denies joint pain, limitation of movement, and swelling, stiffness, low back pain, extremity pain. Denies muscle weakness, cramps, atrophy.  Derm: Denies rash, itching, dry skin, hives, moles, warts, or unhealing ulcers.  Psych: Denies depression, anxiety, memory loss, suicidal ideation, hallucinations, paranoia, and confusion. Heme: Denies bruising, bleeding, and enlarged lymph nodes. Neuro:  Denies any headaches, dizziness, paresthesia Endo:  Denies any problems with DM, thyroid, adrenal   Physical Exam: General: Pleasant, well developed male in no acute distress Head: Normocephalic and atraumatic Eyes:  sclerae anicteric, conjunctiva pink  Ears: Normal auditory acuity Lungs: Clear throughout to auscultation Heart: Regular rate and rhythm Abdomen: Soft, non distended, non-tender. No masses, no hepatomegaly. Normal bowel sounds Rectal: external hemorrhoids, small internal hemorrhoids on right Musculoskeletal: Symmetrical with no gross deformities  Extremities: No edema  Neurological: Alert oriented x 4, grossly nonfocal Psychological:  Alert and cooperative. Normal mood and  affect  Assessment and Recommendations: 73 year old male with hematochezia likely due to external and internal hemorrhoids. He has had injection therapy which provided temporary relief. He plans on traveling to [place] in mid February and would like to proceed with possible be ending if his hemorrhoids are deemed of adequate size. At this time, he will use Anusol suppositories twice a day for 10 days and some Proctosol cream externally as needed. He will use Tucks wipes as needed. We will review the patient with Dr. Fuller Plan to see if he is a candidate for possible banding and if so, schedule him accordingly.     Mills Mitton, Deloris Ping 12/22/2013,

## 2013-12-22 NOTE — Telephone Encounter (Signed)
Patient with 3 episodes of rectal bleeding this am.  First was a large amount with the second and third BMs very little.  He has a history of internal hemorrhoids that were injected in July by Dr. Fuller Plan.  He will come in today and see Lori Hvozdovic, PA at 1:45

## 2013-12-23 NOTE — Progress Notes (Signed)
Reviewed and agree with management plan. Surgical referral for mgmt of internal and external hemorrhoids.  Pricilla Riffle. Fuller Plan, MD [facility]

## 2013-12-24 ENCOUNTER — Encounter: Payer: Self-pay | Admitting: *Deleted

## 2013-12-24 ENCOUNTER — Ambulatory Visit (INDEPENDENT_AMBULATORY_CARE_PROVIDER_SITE_OTHER): Payer: Medicare Other | Admitting: *Deleted

## 2013-12-24 DIAGNOSIS — R001 Bradycardia, unspecified: Secondary | ICD-10-CM | POA: Diagnosis not present

## 2013-12-24 NOTE — Progress Notes (Signed)
Patient ID: Alexander [name], male   DOB: Sep 05, 1940, 73 y.o.   MRN: [id number] [employer] 30 day cardiac event monitor applied to patient.

## 2013-12-27 LAB — VMA, URINE, 24 HOUR
Creatinine 24h urine: 1.5 g/(24.h) (ref 0.63–2.50)
VANILLYLMANDELIC ACID, (VMA): 4.3 mg/(24.h) (ref <=6.0)
Volume, Urine-VMAUR: 1800 mL

## 2013-12-27 LAB — CATECHOLAMINES, FRACTIONATED, URINE, 24 HOUR
CALCULATED TOTAL (E+ NE): 44 ug/(24.h) (ref 26–121)
Creatinine, Urine mg/day-CATEUR: 1.5 g/(24.h) (ref 0.63–2.50)
Dopamine, 24 hr Urine: 174 mcg/24 h (ref 52–480)
Norepinephrine, 24 hr Ur: 44 mcg/24 h (ref 15–100)
TOTAL VOLUME - CF 24HR U: 1800 mL

## 2013-12-29 ENCOUNTER — Telehealth: Payer: Self-pay | Admitting: *Deleted

## 2013-12-29 NOTE — Telephone Encounter (Signed)
-----   Message from [name], MD sent at 12/27/2013 12:23 PM EST ----- Call - urine test is normal. No abnormal production of the chemical that indicates pheochromocytoma.  pete

## 2013-12-30 ENCOUNTER — Telehealth: Payer: Self-pay | Admitting: *Deleted

## 2013-12-30 NOTE — Telephone Encounter (Signed)
-----   Message from Kellie Moor, RN sent at 12/24/2013  1:15 PM EST ----- Will you please look at this for next week for [facility] please ----- Message -----    From: Vita Barley Hvozdovic, PA-C    Sent: 12/24/2013  11:12 AM      To: Kellie Moor, RN  Sheri, can you call this pt next week and see how he isdoing? If he is till bleeding, will likely need surgical referral as he hs internal  and external hemorrhoids.Let me know. Thank you, Cecille Rubin

## 2013-12-30 NOTE — Telephone Encounter (Signed)
Patient states that he is having absolutely no bleeding since starting the anusol suppositories twice daily and using the witch hazel pads. I advised him to call back should his bleeding recur as he will likely need a surgical referral for management of hemorrhoids. He verbalizes understanding. States that he will be going out of the country for 4 weeks but will call us with any further problems.

## 2014-01-05 ENCOUNTER — Telehealth: Payer: Self-pay | Admitting: Cardiology

## 2014-01-05 ENCOUNTER — Telehealth: Payer: Self-pay | Admitting: Internal Medicine

## 2014-01-05 NOTE — Telephone Encounter (Signed)
Labs updated

## 2014-01-05 NOTE — Telephone Encounter (Signed)
Pt want labs done that you cancel ,will you please resch those labs.

## 2014-01-05 NOTE — Telephone Encounter (Signed)
New message       1. Is this related to a heart monitor you are wearing? yes  (If the patient says no, please ask     if they are caling about ICD/pacemaker.) 30day event monitor  2. What is your issue?  Monitor has been buzzing.  Company is going t send him more leads.  Pt have not had monitor on since last thurs night---wanted Dr Marlou Porch to know.     (If the patient is calling for results of the heart monitor this     message should be sent to nurse.)

## 2014-01-05 NOTE — Telephone Encounter (Signed)
Returned patient's call. Will forward to Dr. Marlou Porch and Cherre Huger RN.

## 2014-01-06 NOTE — Telephone Encounter (Signed)
Follow up      Heart monitor sent new leads.  Monitor is still buzzing.  He will get a new unit.  Want dr to know iit has been since last thurs since he had monitor on.  Do not have to call him back

## 2014-01-06 NOTE — Telephone Encounter (Signed)
Thank you  for update

## 2014-01-08 ENCOUNTER — Other Ambulatory Visit (INDEPENDENT_AMBULATORY_CARE_PROVIDER_SITE_OTHER): Payer: Medicare Other

## 2014-01-08 DIAGNOSIS — E119 Type 2 diabetes mellitus without complications: Secondary | ICD-10-CM

## 2014-01-08 DIAGNOSIS — E785 Hyperlipidemia, unspecified: Secondary | ICD-10-CM

## 2014-01-08 DIAGNOSIS — I10 Essential (primary) hypertension: Secondary | ICD-10-CM | POA: Diagnosis not present

## 2014-01-08 LAB — LIPID PANEL
CHOL/HDL RATIO: 4
CHOLESTEROL: 163 mg/dL (ref 0–200)
HDL: 39.7 mg/dL (ref >39.00)
LDL CALC: 108 mg/dL — AB (ref 0–99)
NonHDL: 123.3
Triglycerides: 78 mg/dL (ref 0.0–149.0)
VLDL: 15.6 mg/dL (ref 0.0–40.0)

## 2014-01-08 LAB — HEPATIC FUNCTION PANEL
ALT: 12 U/L (ref 0–53)
AST: 14 U/L (ref 0–37)
Albumin: 3.8 g/dL (ref 3.5–5.2)
Alkaline Phosphatase: 78 U/L (ref 39–117)
BILIRUBIN TOTAL: 0.6 mg/dL (ref 0.2–1.2)
Bilirubin, Direct: 0.1 mg/dL (ref 0.0–0.3)
Total Protein: 6.7 g/dL (ref 6.0–8.3)

## 2014-01-08 LAB — CK: CK TOTAL: 106 U/L (ref 7–232)

## 2014-01-08 LAB — BASIC METABOLIC PANEL
BUN: 25 mg/dL — ABNORMAL HIGH (ref 6–23)
CHLORIDE: 105 meq/L (ref 96–112)
CO2: 29 mEq/L (ref 19–32)
CREATININE: 1.3 mg/dL (ref 0.4–1.5)
Calcium: 9.2 mg/dL (ref 8.4–10.5)
GFR: 56.87 mL/min — ABNORMAL LOW (ref >60.00)
Glucose, Bld: 106 mg/dL — ABNORMAL HIGH (ref 70–99)
POTASSIUM: 5.3 meq/L — AB (ref 3.5–5.1)
SODIUM: 140 meq/L (ref 135–145)

## 2014-01-08 LAB — HEMOGLOBIN A1C: HEMOGLOBIN A1C: 6.3 % (ref 4.6–6.5)

## 2014-01-09 ENCOUNTER — Other Ambulatory Visit: Payer: Medicare Other

## 2014-01-12 ENCOUNTER — Encounter: Payer: Self-pay | Admitting: Internal Medicine

## 2014-01-13 ENCOUNTER — Encounter: Payer: Self-pay | Admitting: Internal Medicine

## 2014-01-14 ENCOUNTER — Ambulatory Visit (INDEPENDENT_AMBULATORY_CARE_PROVIDER_SITE_OTHER): Payer: Medicare Other | Admitting: Family Medicine

## 2014-01-14 ENCOUNTER — Encounter: Payer: Self-pay | Admitting: Family Medicine

## 2014-01-14 ENCOUNTER — Other Ambulatory Visit (INDEPENDENT_AMBULATORY_CARE_PROVIDER_SITE_OTHER): Payer: Medicare Other

## 2014-01-14 VITALS — BP 128/78 | HR 55 | Temp 97.2°F | Ht 69.0 in | Wt 194.3 lb

## 2014-01-14 DIAGNOSIS — I10 Essential (primary) hypertension: Secondary | ICD-10-CM | POA: Diagnosis not present

## 2014-01-14 DIAGNOSIS — R350 Frequency of micturition: Secondary | ICD-10-CM | POA: Diagnosis not present

## 2014-01-14 LAB — POCT URINALYSIS DIPSTICK
Bilirubin, UA: NEGATIVE
Glucose, UA: NEGATIVE
KETONES UA: NEGATIVE
LEUKOCYTES UA: NEGATIVE
Nitrite, UA: NEGATIVE
PH UA: 5.5
Protein, UA: NEGATIVE
Spec Grav, UA: 1.02
Urobilinogen, UA: 0.2

## 2014-01-14 LAB — URINALYSIS, MICROSCOPIC ONLY

## 2014-01-14 LAB — BASIC METABOLIC PANEL
BUN: 22 mg/dL (ref 6–23)
CHLORIDE: 105 meq/L (ref 96–112)
CO2: 29 meq/L (ref 19–32)
Calcium: 9.1 mg/dL (ref 8.4–10.5)
Creatinine, Ser: 1.14 mg/dL (ref 0.40–1.50)
GFR: 66.76 mL/min (ref >60.00)
Glucose, Bld: 94 mg/dL (ref 70–99)
POTASSIUM: 4.5 meq/L (ref 3.5–5.1)
Sodium: 138 mEq/L (ref 135–145)

## 2014-01-14 NOTE — Addendum Note (Signed)
Addended by: Lahoma Crocker A on: 01/14/2014 11:11 AM   Modules accepted: Orders

## 2014-01-14 NOTE — Progress Notes (Signed)
Pre visit review using our clinic review tool, if applicable. No additional management support is needed unless otherwise documented below in the visit note. 

## 2014-01-14 NOTE — Progress Notes (Addendum)
HPI:  Dysuria: -for a few days a few days ago -mild dysuria - usually urinates 3 times per day and urinated 4 times per day for a few days - leaving the country and wants to makes sure no UTI -denies: abd pain, frequency, urgency, blood, pain in rectum, hematuria, fevers, nausea, vomiting, penile pain or discharge -he has had several UTIs remotely and wants to ensure does not have uti before leaving the country - reports he did not know he had to have an appointment for this and was told to see me when he arrived for lab appointment  ROS: See pertinent positives and negatives per HPI.  Past Medical History  Diagnosis Date  . GERD (gastroesophageal reflux disease)   . Hypertension   . Microalbuminuria   . Lumbar back pain   . Hematuria, microscopic   . Hyperglycemia   . Diverticulosis of colon   . Hyperlipidemia   . Gout   . Tubular adenoma of colon 07/2012  . GI bleed     secondary to Aspirin  . Polycythemia rubra vera   . Hemorrhoids   . Arthritis     "minor; shoulders primarily" (05/20/2013)  . CAD (coronary artery disease)     a. 05/2013 - Chest pain/unstable angina and dynamic EKG changes showing cath showing atherosclerotic coronary artery disease manifested as diffuse ectasia, normal EF.  [name] Overweight(278.02)   . Pulmonary nodule     a. 05/2013 - seen on CXR.    Past Surgical History  Procedure Laterality Date  . Cataract extraction w/ intraocular lens  implant, bilateral Bilateral 2008  . Excisional hemorrhoidectomy  1970's  . Cardiac catheterization  05/20/2013  . Inguinal hernia repair Right ~ 2010  . Tonsillectomy and adenoidectomy  1940's  . Cystectomy  ~ 2000    " SEBACEOUS cyst removed from between shoulder blades"  . Left heart catheterization with coronary angiogram N/A 05/20/2013    Procedure: LEFT HEART CATHETERIZATION WITH CORONARY ANGIOGRAM;  Surgeon: Peter M Martinique, MD;  Location: [facility] CATH LAB;  Service: Cardiovascular;  Laterality: N/A;    Family  History  Problem Relation Age of Onset  . Colon cancer Mother     died at 41, colorectal  . Heart disease Father 26    MI  . Diabetes Father   . Diabetes Mother   . Stomach cancer Maternal Grandfather     History   Social History  . Marital Status: Married    Spouse Name: N/A    Number of Children: 3  . Years of Education: N/A   Occupational History  . retired Chief Financial Officer    Social History Main Topics  . Smoking status: Never Smoker   . Smokeless tobacco: Never Used     Comment: smoked for 3 months only  . Alcohol Use: No  . Drug Use: No  . Sexual Activity:    Partners: Female   Other Topics Concern  . None   Social History Narrative   Daughter, [name] (pediatrician--lives in [place], [place])     Current outpatient prescriptions:  .  ACCU-CHEK SOFTCLIX LANCETS lancets, Test 3 times a week, Disp: 100 each, Rfl: 5 .  allopurinol (ZYLOPRIM) 100 MG tablet, Take 1 tablet (100 mg  total) by mouth daily., Disp: 90 tablet, Rfl: 3 .  azelastine (ASTELIN) 0.1 % nasal spray, Place 1 spray into both nostrils 2 (two) times daily. Use in each nostril as directed, Disp: 90 mL, Rfl: 1 .  BENICAR 20 MG tablet, Take 1  tablet (20 mg total) by mouth daily., Disp: 90 tablet, Rfl: 3 .  Coenzyme Q10 300 MG CAPS, Take 1 capsule by mouth daily. , Disp: , Rfl:  .  Efinaconazole (JUBLIA) 10 % SOLN, Apply 1 application topically daily., Disp: 8 mL, Rfl: 5 .  glucose blood (ACCU-CHEK AVIVA) test strip, Test 3 times a week, Disp: 100 each, Rfl: 5 .  hydrocortisone (ANUSOL-HC) 25 MG suppository, Place 1 suppository (25 mg total) rectally 2 (two) times daily., Disp: 120 suppository, Rfl: 0 .  nitroGLYCERIN (NITROSTAT) 0.4 MG SL tablet, Place 1 tablet (0.4 mg total) under the tongue every 5 (five) minutes as needed for chest pain (up to 3 doses)., Disp: 25 tablet, Rfl: 3 .  pantoprazole (PROTONIX) 40 MG tablet, Take 40 mg by mouth daily., Disp: , Rfl:  .  pravastatin (PRAVACHOL) 20 MG tablet, Take 1  tablet (20 mg total) by mouth daily., Disp: 90 tablet, Rfl: 1 .  PROCTOZONE-HC 2.5 % rectal cream, Apply rectally two times  daily, Disp: 60 g, Rfl: 3 .  triamcinolone cream (KENALOG) 0.5 %, Apply 1 application topically 2 (two) times daily., Disp: 15 g, Rfl: 0  EXAM:  Filed Vitals:   01/14/14 1056  BP: 128/78  Pulse: 55  Temp: 97.2 F (36.2 C)    Body mass index is 28.68 kg/(m^2).  GENERAL: vitals reviewed and listed above, alert, oriented, appears well hydrated and in no acute distress  HEENT: atraumatic, conjunttiva clear, no obvious abnormalities on inspection of external nose and ears  NECK: no obvious masses on inspection  LUNGS: clear to auscultation bilaterally, no wheezes, rales or rhonchi, good air movement  CV: HRRR, no peripheral edema  ABD: BS+, soft, NTTP  MS: moves all extremities without noticeable abnormality  PSYCH: pleasant and cooperative, no obvious depression or anxiety  ASSESSMENT AND PLAN:  Discussed the following assessment and plan:  Urinary frequency  -minimal symptoms, now resolved with no symptoms today -check udip per his concerns - advised to follow up if recurrent symptoms -Patient advised to return or notify a doctor immediately if symptoms worsen or persist or new concerns arise.   Addendum: tr blood, udip otherwise normal. Adivsed micro given most dip done at this site have tr blood whereas when I order urinalysis from another lab on the same urine most of the time there is no blood. He reports he has follow up with PCP next week and can discuss if micro + or recurrent symptoms. There are no Patient Instructions on file for this visit.   Colin Benton R.

## 2014-01-15 ENCOUNTER — Encounter: Payer: Self-pay | Admitting: Internal Medicine

## 2014-01-16 ENCOUNTER — Ambulatory Visit: Payer: Medicare Other | Admitting: Internal Medicine

## 2014-01-19 ENCOUNTER — Encounter: Payer: Self-pay | Admitting: Cardiology

## 2014-01-21 ENCOUNTER — Ambulatory Visit (INDEPENDENT_AMBULATORY_CARE_PROVIDER_SITE_OTHER): Payer: Medicare Other | Admitting: Internal Medicine

## 2014-01-21 ENCOUNTER — Encounter: Payer: Self-pay | Admitting: Internal Medicine

## 2014-01-21 VITALS — BP 126/84 | HR 55 | Temp 98.1°F | Ht 69.0 in | Wt 190.0 lb

## 2014-01-21 DIAGNOSIS — K648 Other hemorrhoids: Secondary | ICD-10-CM

## 2014-01-21 DIAGNOSIS — R7309 Other abnormal glucose: Secondary | ICD-10-CM

## 2014-01-21 DIAGNOSIS — R7303 Prediabetes: Secondary | ICD-10-CM

## 2014-01-21 DIAGNOSIS — I10 Essential (primary) hypertension: Secondary | ICD-10-CM

## 2014-01-21 DIAGNOSIS — K649 Unspecified hemorrhoids: Secondary | ICD-10-CM | POA: Insufficient documentation

## 2014-01-21 MED ORDER — HYDROCORTISONE ACETATE 25 MG RE SUPP: 25.0000 mg | Freq: Two times a day (BID) | RECTAL | Status: DC

## 2014-01-21 NOTE — Patient Instructions (Signed)
Please complete the following lab tests before your next follow up appointment: BMET, A1c - 790.29

## 2014-01-21 NOTE — Assessment & Plan Note (Signed)
Managed by GI.  Refilled Anusol suppositories.

## 2014-01-21 NOTE — Progress Notes (Signed)
Pre visit review using our clinic review tool, if applicable. No additional management support is needed unless otherwise documented below in the visit note. 

## 2014-01-21 NOTE — Assessment & Plan Note (Signed)
Stable. Continue management with diet and exercise. Lab Results  Component Value Date   HGBA1C 6.3 01/08/2014

## 2014-01-21 NOTE — Assessment & Plan Note (Signed)
BP well controlled.  Continue Benicar.  Patient still undergoing evaluation by cardiology. He is wearing event recorder. BP: 126/84 mmHg

## 2014-01-21 NOTE — Progress Notes (Signed)
Subjective:    Patient ID: Alexander [name], male    DOB: 08-31-1940, 74 y.o.   MRN: [id number]  HPI  74 year old white male with history of hypertension, prediabetes and internal hemorrhoids for follow-up. Interval medical history-patient seen by gastroenterologist for hematochezia secondary to external and internal hemorrhoids.  Patient reports his symptoms significantly improved with using Anusol suppositories.  Hypertension-stable  Prediabetes-stable   Review of Systems Negative for chest pain.  He is tolerating pravastatin 20 mg    Past Medical History  Diagnosis Date  . GERD (gastroesophageal reflux disease)   . Hypertension   . Microalbuminuria   . Lumbar back pain   . Hematuria, microscopic   . Hyperglycemia   . Diverticulosis of colon   . Hyperlipidemia   . Gout   . Tubular adenoma of colon 07/2012  . GI bleed     secondary to Aspirin  . Polycythemia rubra vera   . Hemorrhoids   . Arthritis     "minor; shoulders primarily" (05/20/2013)  . CAD (coronary artery disease)     a. 05/2013 - Chest pain/unstable angina and dynamic EKG changes showing cath showing atherosclerotic coronary artery disease manifested as diffuse ectasia, normal EF.  [name] Overweight(278.02)   . Pulmonary nodule     a. 05/2013 - seen on CXR.    History   Social History  . Marital Status: Married    Spouse Name: N/A    Number of Children: 3  . Years of Education: N/A   Occupational History  . retired Chief Financial Officer    Social History Main Topics  . Smoking status: Never Smoker   . Smokeless tobacco: Never Used     Comment: smoked for 3 months only  . Alcohol Use: No  . Drug Use: No  . Sexual Activity:    Partners: Female   Other Topics Concern  . Not on file   Social History Narrative   Daughter, [name] (pediatrician--lives in [place], [place])    Past Surgical History  Procedure Laterality Date  . Cataract extraction w/ intraocular lens  implant, bilateral Bilateral 2008  .  Excisional hemorrhoidectomy  1970's  . Cardiac catheterization  05/20/2013  . Inguinal hernia repair Right ~ 2010  . Tonsillectomy and adenoidectomy  1940's  . Cystectomy  ~ 2000    " SEBACEOUS cyst removed from between shoulder blades"  . Left heart catheterization with coronary angiogram N/A 05/20/2013    Procedure: LEFT HEART CATHETERIZATION WITH CORONARY ANGIOGRAM;  Surgeon: Peter M Martinique, MD;  Location: [place] [facility] CATH LAB;  Service: Cardiovascular;  Laterality: N/A;    Family History  Problem Relation Age of Onset  . Colon cancer Mother     died at 69, colorectal  . Heart disease Father 55    MI  . Diabetes Father   . Diabetes Mother   . Stomach cancer Maternal Grandfather     Allergies  Allergen Reactions  . Metformin And Related Diarrhea    Current Outpatient Prescriptions on File Prior to Visit  Medication Sig Dispense Refill  . ACCU-CHEK SOFTCLIX LANCETS lancets Test 3 times a week 100 each 5  . allopurinol (ZYLOPRIM) 100 MG tablet Take 1 tablet (100 mg  total) by mouth daily. 90 tablet 3  . azelastine (ASTELIN) 0.1 % nasal spray Place 1 spray into both nostrils 2 (two) times daily. Use in each nostril as directed 90 mL 1  . BENICAR 20 MG tablet Take 1 tablet (20 mg total) by mouth daily. 90 tablet  3  . Coenzyme Q10 300 MG CAPS Take 1 capsule by mouth daily.     . Efinaconazole (JUBLIA) 10 % SOLN Apply 1 application topically daily. 8 mL 5  . glucose blood (ACCU-CHEK AVIVA) test strip Test 3 times a week 100 each 5  . hydrocortisone (ANUSOL-HC) 25 MG suppository Place 1 suppository (25 mg total) rectally 2 (two) times daily. 120 suppository 0  . nitroGLYCERIN (NITROSTAT) 0.4 MG SL tablet Place 1 tablet (0.4 mg total) under the tongue every 5 (five) minutes as needed for chest pain (up to 3 doses). 25 tablet 3  . pantoprazole (PROTONIX) 40 MG tablet Take 40 mg by mouth daily.    . pravastatin (PRAVACHOL) 20 MG tablet Take 1 tablet (20 mg total) by mouth daily. 90 tablet 1  .  PROCTOZONE-HC 2.5 % rectal cream Apply rectally two times  daily 60 g 3  . triamcinolone cream (KENALOG) 0.5 % Apply 1 application topically 2 (two) times daily. 15 g 0   No current facility-administered medications on file prior to visit.    BP 126/84 mmHg  Pulse 55  Temp(Src) 98.1 F (36.7 C) (Oral)  Ht 5\' 9"  (1.753 m)  Wt 190 lb (86.183 kg)  BMI 28.05 kg/m2    Objective:   Physical Exam  Constitutional: He is oriented to person, place, and time. He appears well-developed and well-nourished. No distress.  Cardiovascular: Normal rate, regular rhythm and normal heart sounds.   Pulmonary/Chest: Effort normal and breath sounds normal. He has no wheezes.  Neurological: He is alert and oriented to person, place, and time. No cranial nerve deficit.  Psychiatric: He has a normal mood and affect. His behavior is normal.          Assessment & Plan:

## 2014-01-26 ENCOUNTER — Encounter: Payer: Self-pay | Admitting: Hematology & Oncology

## 2014-01-29 ENCOUNTER — Ambulatory Visit: Payer: Medicare Other | Admitting: Podiatry

## 2014-01-30 ENCOUNTER — Encounter: Payer: Self-pay | Admitting: Hematology & Oncology

## 2014-01-30 ENCOUNTER — Ambulatory Visit (HOSPITAL_BASED_OUTPATIENT_CLINIC_OR_DEPARTMENT_OTHER): Payer: Medicare Other | Admitting: Hematology & Oncology

## 2014-01-30 ENCOUNTER — Other Ambulatory Visit (HOSPITAL_BASED_OUTPATIENT_CLINIC_OR_DEPARTMENT_OTHER): Payer: Medicare Other | Admitting: Lab

## 2014-01-30 ENCOUNTER — Ambulatory Visit (HOSPITAL_BASED_OUTPATIENT_CLINIC_OR_DEPARTMENT_OTHER): Payer: Medicare Other

## 2014-01-30 VITALS — HR 55 | Temp 97.6°F | Resp 18 | Ht 69.0 in | Wt 193.0 lb

## 2014-01-30 DIAGNOSIS — D45 Polycythemia vera: Secondary | ICD-10-CM

## 2014-01-30 DIAGNOSIS — I10 Essential (primary) hypertension: Secondary | ICD-10-CM

## 2014-01-30 LAB — CMP (CANCER CENTER ONLY)
ALK PHOS: 82 U/L (ref 26–84)
ALT: 14 U/L (ref 10–47)
AST: 20 U/L (ref 11–38)
Albumin: 3.6 g/dL (ref 3.3–5.5)
BILIRUBIN TOTAL: 0.7 mg/dL (ref 0.20–1.60)
BUN, Bld: 26 mg/dL — ABNORMAL HIGH (ref 7–22)
CALCIUM: 9.1 mg/dL (ref 8.0–10.3)
CHLORIDE: 102 meq/L (ref 98–108)
CO2: 28 mEq/L (ref 18–33)
CREATININE: 1.1 mg/dL (ref 0.6–1.2)
Glucose, Bld: 107 mg/dL (ref 73–118)
Potassium: 3.8 mEq/L (ref 3.3–4.7)
SODIUM: 143 meq/L (ref 128–145)
Total Protein: 7.5 g/dL (ref 6.4–8.1)

## 2014-01-30 LAB — CBC WITH DIFFERENTIAL (CANCER CENTER ONLY)
BASO#: 0 10*3/uL (ref 0.0–0.2)
BASO%: 0.2 % (ref 0.0–2.0)
EOS%: 3.6 % (ref 0.0–7.0)
Eosinophils Absolute: 0.3 10*3/uL (ref 0.0–0.5)
HCT: 47 % (ref 38.7–49.9)
HGB: 15.3 g/dL (ref 13.0–17.1)
LYMPH#: 1.7 10*3/uL (ref 0.9–3.3)
LYMPH%: 19.5 % (ref 14.0–48.0)
MCH: 29.6 pg (ref 28.0–33.4)
MCHC: 32.6 g/dL (ref 32.0–35.9)
MCV: 91 fL (ref 82–98)
MONO#: 1.1 10*3/uL — AB (ref 0.1–0.9)
MONO%: 12.5 % (ref 0.0–13.0)
NEUT%: 64.2 % (ref 40.0–80.0)
NEUTROS ABS: 5.5 10*3/uL (ref 1.5–6.5)
Platelets: 209 10*3/uL (ref 145–400)
RBC: 5.17 10*6/uL (ref 4.20–5.70)
RDW: 13.8 % (ref 11.1–15.7)
WBC: 8.5 10*3/uL (ref 4.0–10.0)

## 2014-01-30 LAB — IRON AND TIBC CHCC
%SAT: 11 % — ABNORMAL LOW (ref 20–55)
Iron: 38 ug/dL — ABNORMAL LOW (ref 42–163)
TIBC: 351 ug/dL (ref 202–409)
UIBC: 313 ug/dL (ref 117–376)

## 2014-01-30 LAB — FERRITIN CHCC: Ferritin: 20 ng/ml — ABNORMAL LOW (ref 22–316)

## 2014-01-30 NOTE — Progress Notes (Signed)
Hematology and Oncology Follow Up Visit  Alexander [name] [id number] 1940/01/27 74 y.o. 01/30/2014   Principle Diagnosis:  Polycythemia vera-JAK2 negative  Current Therapy:    Phlebotomy to maintain hematocrit below 45%  Aspirin 81 mg by mouth daily     Interim History:  Mr.  Alexander [name] is in for a followup. He feels well. He did have a Holter monitor put on him. He does not other results yet.  He has wife are going back to [place] next week. They'll be going for about a month. It typically go over once or twice a year.  He's had no problems with chest pain. He's had no cough. He's had no shortness of breath. He's had no fever. He's had no rashes.  His appetite has been good.  There's been no change in bowel or bladder habits.  Overall, his performance status is ECOG 1.    Medications:  Current outpatient prescriptions:  .  ACCU-CHEK SOFTCLIX LANCETS lancets, Test 3 times a week, Disp: 100 each, Rfl: 5 .  allopurinol (ZYLOPRIM) 100 MG tablet, Take 1 tablet (100 mg  total) by mouth daily., Disp: 90 tablet, Rfl: 3 .  azelastine (ASTELIN) 0.1 % nasal spray, Place 1 spray into both nostrils 2 (two) times daily. Use in each nostril as directed, Disp: 90 mL, Rfl: 1 .  BENICAR 20 MG tablet, Take 1 tablet (20 mg total) by mouth daily., Disp: 90 tablet, Rfl: 3 .  Coenzyme Q10 300 MG CAPS, Take 1 capsule by mouth daily. , Disp: , Rfl:  .  Efinaconazole (JUBLIA) 10 % SOLN, Apply 1 application topically daily., Disp: 8 mL, Rfl: 5 .  glucose blood (ACCU-CHEK AVIVA) test strip, Test 3 times a week, Disp: 100 each, Rfl: 5 .  hydrocortisone (ANUSOL-HC) 25 MG suppository, Place 1 suppository (25 mg total) rectally 2 (two) times daily., Disp: 120 suppository, Rfl: 0 .  nitroGLYCERIN (NITROSTAT) 0.4 MG SL tablet, Place 1 tablet (0.4 mg total) under the tongue every 5 (five) minutes as needed for chest pain (up to 3 doses)., Disp: 25 tablet, Rfl: 3 .  pantoprazole (PROTONIX) 40 MG tablet, Take 40  mg by mouth daily., Disp: , Rfl:  .  pravastatin (PRAVACHOL) 20 MG tablet, Take 1 tablet (20 mg total) by mouth daily., Disp: 90 tablet, Rfl: 1 .  PROCTOZONE-HC 2.5 % rectal cream, Apply rectally two times  daily, Disp: 60 g, Rfl: 3 .  triamcinolone cream (KENALOG) 0.5 %, Apply 1 application topically 2 (two) times daily., Disp: 15 g, Rfl: 0  Allergies:  Allergies  Allergen Reactions  . Metformin And Related Diarrhea    Past Medical History, Surgical history, Social history, and Family History were reviewed and updated.  Review of Systems: As above  Physical Exam:  height is 5\' 9"  (1.753 m) and weight is 193 lb (87.544 kg). His oral temperature is 97.6 F (36.4 C). His pulse is 55. His respiration is 18.   Well-developed and well-nourished gentleman. Head and neck exam shows no ocular or oral lesions. He has no palpable cervical or supraclavicular lymph nodes. His lungs are clear with no rales, wheezes or rhonchi. Cardiac exam regular rate and rhythm with no murmurs, rubs or bruits.. Abdomen is soft. He has good bowel sounds. There is no fluid wave. There is no palpable liver or spleen tip. Back exam shows no tenderness over the spine, ribs or hips. Extremities shows no clubbing cyanosis or edema. He has good range of motion of his joints.  Has good strength in his extremities. Skin exam no rashes, ecchymoses or petechia. Neurological exam is nonfocal.  Lab Results  Component Value Date   WBC 8.5 01/30/2014   HGB 15.3 01/30/2014   HCT 47.0 01/30/2014   MCV 91 01/30/2014   PLT 209 01/30/2014     Chemistry      Component Value Date/Time   NA 143 01/30/2014 0856   NA 138 01/14/2014 1112   K 3.8 01/30/2014 0856   K 4.5 01/14/2014 1112   CL 102 01/30/2014 0856   CL 105 01/14/2014 1112   CO2 28 01/30/2014 0856   CO2 29 01/14/2014 1112   BUN 26* 01/30/2014 0856   BUN 22 01/14/2014 1112   CREATININE 1.1 01/30/2014 0856   CREATININE 1.14 01/14/2014 1112      Component Value  Date/Time   CALCIUM 9.1 01/30/2014 0856   CALCIUM 9.1 01/14/2014 1112   ALKPHOS 82 01/30/2014 0856   ALKPHOS 78 01/08/2014 0827   AST 20 01/30/2014 0856   AST 14 01/08/2014 0827   ALT 14 01/30/2014 0856   ALT 12 01/08/2014 0827   BILITOT 0.70 01/30/2014 0856   BILITOT 0.6 01/08/2014 0827         Impression and Plan: Alexander [name] is 74 year old gentleman. He has polycythemia. We have done a good job in [place] him and keeping his blood counts adequate. I will go ahead and phlebotomize him now. I want to make sure that we keep his blood down so that he can go over to [place] and have a nice time. For now, I will plan to have come back in another 6-8 weeks. We will get him back after his wife get back from [place] in mid March   [name], MD 1/29/201610:11 AM

## 2014-01-30 NOTE — Progress Notes (Signed)
Alexander [name] presents today for phlebotomy per MD orders. Phlebotomy procedure started at 0945and ended at 1005. 50 grams removed. Patient observed for 30 minutes after procedure without any incident. Patient tolerated procedure well. IV needle removed intact.

## 2014-01-30 NOTE — Patient Instructions (Signed)

## 2014-02-01 ENCOUNTER — Other Ambulatory Visit: Payer: Self-pay

## 2014-02-01 ENCOUNTER — Encounter: Payer: Self-pay | Admitting: Internal Medicine

## 2014-02-01 ENCOUNTER — Emergency Department (HOSPITAL_COMMUNITY)
Admission: EM | Admit: 2014-02-01 | Discharge: 2014-02-01 | Disposition: A | Discharge status: Home / Self Care | Payer: Medicare Other | Attending: Emergency Medicine | Admitting: Emergency Medicine

## 2014-02-01 ENCOUNTER — Emergency Department (HOSPITAL_COMMUNITY): Payer: Medicare Other

## 2014-02-01 ENCOUNTER — Encounter (HOSPITAL_COMMUNITY): Payer: Self-pay | Admitting: Physical Medicine and Rehabilitation

## 2014-02-01 DIAGNOSIS — Z9889 Other specified postprocedural states: Secondary | ICD-10-CM | POA: Insufficient documentation

## 2014-02-01 DIAGNOSIS — M109 Gout, unspecified: Secondary | ICD-10-CM | POA: Diagnosis not present

## 2014-02-01 DIAGNOSIS — R079 Chest pain, unspecified: Secondary | ICD-10-CM | POA: Diagnosis not present

## 2014-02-01 DIAGNOSIS — Z9842 Cataract extraction status, left eye: Secondary | ICD-10-CM | POA: Diagnosis not present

## 2014-02-01 DIAGNOSIS — M1990 Unspecified osteoarthritis, unspecified site: Secondary | ICD-10-CM | POA: Diagnosis not present

## 2014-02-01 DIAGNOSIS — Z7952 Long term (current) use of systemic steroids: Secondary | ICD-10-CM | POA: Diagnosis not present

## 2014-02-01 DIAGNOSIS — I2510 Atherosclerotic heart disease of native coronary artery without angina pectoris: Secondary | ICD-10-CM | POA: Diagnosis not present

## 2014-02-01 DIAGNOSIS — I10 Essential (primary) hypertension: Secondary | ICD-10-CM | POA: Insufficient documentation

## 2014-02-01 DIAGNOSIS — E785 Hyperlipidemia, unspecified: Secondary | ICD-10-CM | POA: Insufficient documentation

## 2014-02-01 DIAGNOSIS — Z9841 Cataract extraction status, right eye: Secondary | ICD-10-CM | POA: Insufficient documentation

## 2014-02-01 DIAGNOSIS — E663 Overweight: Secondary | ICD-10-CM | POA: Diagnosis not present

## 2014-02-01 DIAGNOSIS — K219 Gastro-esophageal reflux disease without esophagitis: Secondary | ICD-10-CM | POA: Insufficient documentation

## 2014-02-01 DIAGNOSIS — Z79899 Other long term (current) drug therapy: Secondary | ICD-10-CM | POA: Insufficient documentation

## 2014-02-01 LAB — CBC WITH DIFFERENTIAL/PLATELET
BASOS ABS: 0.1 10*3/uL (ref 0.0–0.1)
Basophils Relative: 1 % (ref 0–1)
EOS ABS: 0.2 10*3/uL (ref 0.0–0.7)
Eosinophils Relative: 2 % (ref 0–5)
HCT: 40.9 % (ref 39.0–52.0)
HEMOGLOBIN: 13.5 g/dL (ref 13.0–17.0)
LYMPHS ABS: 2.8 10*3/uL (ref 0.7–4.0)
LYMPHS PCT: 27 % (ref 12–46)
MCH: 29.3 pg (ref 26.0–34.0)
MCHC: 33 g/dL (ref 30.0–36.0)
MCV: 88.7 fL (ref 78.0–100.0)
Monocytes Absolute: 1.1 10*3/uL — ABNORMAL HIGH (ref 0.1–1.0)
Monocytes Relative: 11 % (ref 3–12)
Neutro Abs: 6 10*3/uL (ref 1.7–7.7)
Neutrophils Relative %: 59 % (ref 43–77)
PLATELETS: 201 10*3/uL (ref 150–400)
RBC: 4.61 MIL/uL (ref 4.22–5.81)
RDW: 13.8 % (ref 11.5–15.5)
WBC: 10.1 10*3/uL (ref 4.0–10.5)

## 2014-02-01 LAB — BASIC METABOLIC PANEL
Anion gap: 10 (ref 5–15)
BUN: 19 mg/dL (ref 6–23)
CALCIUM: 9 mg/dL (ref 8.4–10.5)
CHLORIDE: 105 mmol/L (ref 96–112)
CO2: 23 mmol/L (ref 19–32)
CREATININE: 1.12 mg/dL (ref 0.50–1.35)
GFR calc non Af Amer: 63 mL/min — ABNORMAL LOW (ref >90)
GFR, EST AFRICAN AMERICAN: 73 mL/min — AB (ref >90)
Glucose, Bld: 122 mg/dL — ABNORMAL HIGH (ref 70–99)
POTASSIUM: 4.1 mmol/L (ref 3.5–5.1)
SODIUM: 138 mmol/L (ref 135–145)

## 2014-02-01 LAB — I-STAT TROPONIN, ED
Troponin i, poc: 0 ng/mL (ref 0.00–0.08)
Troponin i, poc: 0 ng/mL (ref 0.00–0.08)

## 2014-02-01 LAB — BRAIN NATRIURETIC PEPTIDE: B Natriuretic Peptide: 24.4 pg/mL (ref 0.0–100.0)

## 2014-02-01 NOTE — Discharge Instructions (Signed)
Return to the emergency room with worsening of symptoms, new symptoms or with symptoms that are concerning , especially chest pain that feels like a pressure, spreads to left arm or jaw, worse with exertion, associated with nausea, vomiting, shortness of breath and/or sweating.  Continue to take Pantoprazole/protonix and you may add zantac Make sure to attend your cardiology appointment as scheduled on Friday Please call your doctor for a followup appointment within 24-48 hours. When you talk to your doctor please let them know that you were seen in the emergency department and have them acquire all of your records so that they can discuss the findings with you and formulate a treatment plan to fully care for your new and ongoing problems.    Chest Pain (Nonspecific) It is often hard to give a specific diagnosis for the cause of chest pain. There is always a chance that your pain could be related to something serious, such as a heart attack or a blood clot in the lungs. You need to follow up with your health care provider for further evaluation. CAUSES   Heartburn.  Pneumonia or bronchitis.  Anxiety or stress.  Inflammation around your heart (pericarditis) or lung (pleuritis or pleurisy).  A blood clot in the lung.  A collapsed lung (pneumothorax). It can develop suddenly on its own (spontaneous pneumothorax) or from trauma to the chest.  Shingles infection (herpes zoster virus). The chest wall is composed of bones, muscles, and cartilage. Any of these can be the source of the pain.  The bones can be bruised by injury.  The muscles or cartilage can be strained by coughing or overwork.  The cartilage can be affected by inflammation and become sore (costochondritis). DIAGNOSIS  Lab tests or other studies may be needed to find the cause of your pain. Your health care provider may have you take a test called an ambulatory electrocardiogram (ECG). An ECG records your heartbeat patterns over  a 24-hour period. You may also have other tests, such as:  Transthoracic echocardiogram (TTE). During echocardiography, sound waves are used to evaluate how blood flows through your heart.  Transesophageal echocardiogram (TEE).  Cardiac monitoring. This allows your health care provider to monitor your heart rate and rhythm in real time.  Holter monitor. This is a portable device that records your heartbeat and can help diagnose heart arrhythmias. It allows your health care provider to track your heart activity for several days, if needed.  Stress tests by exercise or by giving medicine that makes the heart beat faster. TREATMENT   Treatment depends on what may be causing your chest pain. Treatment may include:  Acid blockers for heartburn.  Anti-inflammatory medicine.  Pain medicine for inflammatory conditions.  Antibiotics if an infection is present.  You may be advised to change lifestyle habits. This includes stopping smoking and avoiding alcohol, caffeine, and chocolate.  You may be advised to keep your head raised (elevated) when sleeping. This reduces the chance of acid going backward from your stomach into your esophagus. Most of the time, nonspecific chest pain will improve within 2-3 days with rest and mild pain medicine.  HOME CARE INSTRUCTIONS   If antibiotics were prescribed, take them as directed. Finish them even if you start to feel better.  For the next few days, avoid physical activities that bring on chest pain. Continue physical activities as directed.  Do not use any tobacco products, including cigarettes, chewing tobacco, or electronic cigarettes.  Avoid drinking alcohol.  Only take medicine as  directed by your health care provider.  Follow your health care provider's suggestions for further testing if your chest pain does not go away.  Keep any follow-up appointments you made. If you do not go to an appointment, you could develop lasting (chronic)  problems with pain. If there is any problem keeping an appointment, call to reschedule. SEEK MEDICAL CARE IF:   Your chest pain does not go away, even after treatment.  You have a rash with blisters on your chest.  You have a fever. SEEK IMMEDIATE MEDICAL CARE IF:   You have increased chest pain or pain that spreads to your arm, neck, jaw, back, or abdomen.  You have shortness of breath.  You have an increasing cough, or you cough up blood.  You have severe back or abdominal pain.  You feel nauseous or vomit.  You have severe weakness.  You faint.  You have chills. This is an emergency. Do not wait to see if the pain will go away. Get medical help at once. Call your local emergency services (911 in [place] Do not drive yourself to the hospital. MAKE SURE YOU:   Understand these instructions.  Will watch your condition.  Will get help right away if you are not doing well or get worse. Document Released: 09/28/2004 Document Revised: 12/24/2012 Document Reviewed: 07/25/2007 [facility] Patient Information 2015 [place], [place]. This information is not intended to replace advice given to you by your health care provider. Make sure you discuss any questions you have with your health care provider.

## 2014-02-01 NOTE — ED Notes (Signed)
Pt presents to department via [facility] for evaluation of chest pain. Pt reports midsternal chest pain radiating to L shoulder, onset 0700 this morning. Denies pain upon arrival to ED. Respirations unlabored. 324 ASA and (1) sublingual nitro at home. Pt is alert and oriented x4.

## 2014-02-01 NOTE — ED Provider Notes (Signed)
CSN: [id number]     Arrival date & time 02/01/14  0740 History   First MD Initiated Contact with Patient 02/01/14 0750     Chief Complaint  Patient presents with  . Chest Pain     (Consider location/radiation/quality/duration/timing/severity/associated sxs/prior Treatment) HPI  Alexander [name] is a 74 y.o. male with PMH of polycythemia vera, HTN, hyperlipidemia, hyperglycemia, CAD with 30% disease on cath and normal EF 05/2013 presenting with chest pain that started around 6:30am this morning, described as a burning sensation mid chest radiates into left shoulder that lasted for 78mins. Patient took a sublingual nitroglycerin at home as well as 324 aspirin. He also took a Zantac at the same time and these resolved his pain. Patient denied shortness of breath, nausea, vomiting, diaphoresis. Patient has had chest pain like this before and is followed by cardiology Dr. Marlou Porch. Last episode 05/2013. Pt had EKG changes and was admitted. Pt today is not as severe. Pt with CT 2014 without PE. No history of DVT, PE, no peripheral edema, history of malignancy. Pt with history of polycythemia vera for over 10 years. He reports getting blood drawn every 1-2 months.   Past Medical History  Diagnosis Date  . GERD (gastroesophageal reflux disease)   . Hypertension   . Microalbuminuria   . Lumbar back pain   . Hematuria, microscopic   . Hyperglycemia   . Diverticulosis of colon   . Hyperlipidemia   . Gout   . Tubular adenoma of colon 07/2012  . GI bleed     secondary to Aspirin  . Polycythemia rubra vera   . Hemorrhoids   . Arthritis     "minor; shoulders primarily" (05/20/2013)  . CAD (coronary artery disease)     a. 05/2013 - Chest pain/unstable angina and dynamic EKG changes showing cath showing atherosclerotic coronary artery disease manifested as diffuse ectasia, normal EF.  [name] Overweight(278.02)   . Pulmonary nodule     a. 05/2013 - seen on CXR.   Past Surgical History  Procedure  Laterality Date  . Cataract extraction w/ intraocular lens  implant, bilateral Bilateral 2008  . Excisional hemorrhoidectomy  1970's  . Cardiac catheterization  05/20/2013  . Inguinal hernia repair Right ~ 2010  . Tonsillectomy and adenoidectomy  1940's  . Cystectomy  ~ 2000    " SEBACEOUS cyst removed from between shoulder blades"  . Left heart catheterization with coronary angiogram N/A 05/20/2013    Procedure: LEFT HEART CATHETERIZATION WITH CORONARY ANGIOGRAM;  Surgeon: Peter M Martinique, MD;  Location: [facility] CATH LAB;  Service: Cardiovascular;  Laterality: N/A;   Family History  Problem Relation Age of Onset  . Colon cancer Mother     died at 63, colorectal  . Heart disease Father 57    MI  . Diabetes Father   . Diabetes Mother   . Stomach cancer Maternal Grandfather    History  Substance Use Topics  . Smoking status: Never Smoker   . Smokeless tobacco: Never Used     Comment: smoked for 3 months only  . Alcohol Use: No    Review of Systems 10 Systems reviewed and are negative for acute change except as noted in the HPI.    Allergies  Metformin and related  Home Medications   Prior to Admission medications   Medication Sig Start Date End Date Taking? Authorizing Provider  allopurinol (ZYLOPRIM) 100 MG tablet Take 1 tablet (100 mg  total) by mouth daily.   Yes Doe-Hyun Kyra Searles,  DO  azelastine (ASTELIN) 0.1 % nasal spray Place 1 spray into both nostrils 2 (two) times daily. Use in each nostril as directed 11/13/13  Yes Doe-Hyun R Yoo, DO  BENICAR 20 MG tablet Take 1 tablet (20 mg total) by mouth daily.   Yes Doe-Hyun R Yoo, DO  Coenzyme Q10 300 MG CAPS Take 1 capsule by mouth daily.    Yes Historical Provider, MD  Efinaconazole (JUBLIA) 10 % SOLN Apply 1 application topically daily. 06/25/13  Yes Max T Hyatt, DPM  hydrocortisone (ANUSOL-HC) 25 MG suppository Place 1 suppository (25 mg total) rectally 2 (two) times daily. 01/21/14  Yes Doe-Hyun R Shawna Orleans, DO  nitroGLYCERIN  (NITROSTAT) 0.4 MG SL tablet Place 1 tablet (0.4 mg total) under the tongue every 5 (five) minutes as needed for chest pain (up to 3 doses). 05/21/13  Yes Dayna N Dunn, PA-C  pantoprazole (PROTONIX) 40 MG tablet Take 40 mg by mouth daily.   Yes Historical Provider, MD  pravastatin (PRAVACHOL) 20 MG tablet Take 1 tablet (20 mg total) by mouth daily. 11/17/13  Yes Doe-Hyun Kyra Searles, DO  triamcinolone cream (KENALOG) 0.5 % Apply 1 application topically 2 (two) times daily. 08/15/13  Yes Doe-Hyun Kyra Searles, DO  ACCU-CHEK SOFTCLIX LANCETS lancets Test 3 times a week 11/18/13   Doe-Hyun R Shawna Orleans, DO  glucose blood (ACCU-CHEK AVIVA) test strip Test 3 times a week 11/18/13   Doe-Hyun R Shawna Orleans, DO  PROCTOZONE-HC 2.5 % rectal cream Apply rectally two times  daily    Doe-Hyun R Yoo, DO   BP 121/70 mmHg  Pulse 96  Temp(Src) 98.5 F (36.9 C) (Oral)  Resp 14  SpO2 98% Physical Exam  Constitutional: He appears well-developed and well-nourished. No distress.  HENT:  Head: Normocephalic and atraumatic.  Eyes: Conjunctivae and EOM are normal. Right eye exhibits no discharge. Left eye exhibits no discharge.  Neck: No JVD present.  Cardiovascular: Normal rate, regular rhythm and normal heart sounds.   No leg swelling or tenderness. Negative Homan's sign.  Pulmonary/Chest: Effort normal and breath sounds normal. No respiratory distress. He has no wheezes.  Abdominal: Soft. Bowel sounds are normal. He exhibits no distension. There is no tenderness.  Neurological: He is alert. He exhibits normal muscle tone. Coordination normal.  Skin: Skin is warm and dry. He is not diaphoretic.  Nursing note and vitals reviewed.   ED Course  Procedures (including critical care time) Labs Review Labs Reviewed  CBC WITH DIFFERENTIAL/PLATELET - Abnormal; Notable for the following:    Monocytes Absolute 1.1 (*)    All other components within normal limits  BASIC METABOLIC PANEL - Abnormal; Notable for the following:    Glucose, Bld 122  (*)    GFR calc non Af Amer 63 (*)    GFR calc Af Amer 73 (*)    All other components within normal limits  BRAIN NATRIURETIC PEPTIDE  I-STAT TROPOININ, ED  Randolm Idol, ED    Imaging Review Dg Chest 2 View  02/01/2014   CLINICAL DATA:  Acute chest pain.  EXAM: CHEST  2 VIEW  COMPARISON:  May 20, 2013.  FINDINGS: The heart size and mediastinal contours are within normal limits. Both lungs are clear. No pneumothorax or pleural effusion is noted. The visualized skeletal structures are unremarkable.  IMPRESSION: No acute cardiopulmonary abnormality seen.   Electronically Signed   By: Sabino Dick M.D.   On: 02/01/2014 08:55     EKG Interpretation   Date/Time:  Sunday February 01 2014 07:50:03 EST Ventricular Rate:  51 PR Interval:  133 QRS Duration: 99 QT Interval:  425 QTC Calculation: 391 R Axis:   39 Text Interpretation:  Sinus rhythm Ventricular premature complex Confirmed  by Hazle Coca [phone]) on 02/01/2014 10:08:29 AM      MDM   Final diagnoses:  Chest pain, unspecified chest pain type   Chest pain likely not of cardiac or pulmonary etiology. Patient with history of similar chest pain. This chest pain is not severe. It lasted for 15 minutes and is now resolved. Not associated with shortness of breath, nausea, vomiting, diaphoresis. Nonexertional. Regular rate and rhythm, lungs clear to auscultation bilaterally. No JVD or peripheral edema. VSS. EKG was sinus rhythm with occasional PVCs. Troponin and delta troponin negative. Chest x-ray without acute abnormalities. Spoke with Dr. Mertie Moores with cardiology and discussed the case. He personally evaluated EKG and felt patient could be discharge with negative Delta troponin with follow-up on Friday, in 5 days as scheduled. Patient has been advised to continue Zantac and follow up with PCP.  Discussed return precautions with patient. Discussed all results and patient verbalizes understanding and agrees with plan.  This is a  shared patient. This patient was discussed with the physician who saw and evaluated the patient and agrees with the plan.     Pura Spice, PA-C 02/01/14 [place], MD 02/01/14 1251

## 2014-02-02 ENCOUNTER — Encounter: Payer: Self-pay | Admitting: Family Medicine

## 2014-02-02 ENCOUNTER — Telehealth: Payer: Self-pay | Admitting: Internal Medicine

## 2014-02-02 ENCOUNTER — Ambulatory Visit (INDEPENDENT_AMBULATORY_CARE_PROVIDER_SITE_OTHER): Payer: Medicare Other | Admitting: Family Medicine

## 2014-02-02 VITALS — BP 120/74 | HR 77 | Temp 97.8°F | Wt 185.0 lb

## 2014-02-02 DIAGNOSIS — R110 Nausea: Secondary | ICD-10-CM | POA: Diagnosis not present

## 2014-02-02 DIAGNOSIS — R42 Dizziness and giddiness: Secondary | ICD-10-CM

## 2014-02-02 LAB — ERYTHROPOIETIN: Erythropoietin: 24.6 m[IU]/mL — ABNORMAL HIGH (ref 2.6–18.5)

## 2014-02-02 NOTE — Telephone Encounter (Signed)
Message sent via my chart

## 2014-02-02 NOTE — Telephone Encounter (Signed)
Patient Name: Alexander [name] DOB: 18-Apr-1940 Initial Comment caller states he is weak, tired and head feels like a balloon Nurse Assessment Nurse: Marcelline Deist, RN, Kermit Balo Date/Time (Eastern Time): 02/02/2014 12:49:46 PM Confirm and document reason for call. If symptomatic, describe symptoms. ---Caller states he is weak, tired and head feels like a balloon - sinus pressure. Seen in the ER yesterday with chest pain, radiated to arm, called 911 - no cardiac issues. has had a few of these episodes before. Has had heart scan, Holter monitor, etc. Now feels like he has been run over by a truck. No fever. Has the patient traveled out of the country within the last 30 days? ---Not Applicable Does the patient require triage? ---Yes Related visit to physician within the last 2 weeks? ---Yes Does the PT have any chronic conditions? (i.e. diabetes, asthma, etc.) ---Yes List chronic conditions. ---6.1-6.3 A1C Guidelines Guideline Title Affirmed Question Affirmed Notes Weakness (Generalized) and Fatigue [1] MODERATE weakness (i.e., interferes with work, school, normal activities) AND [2] cause unknown (Exceptions: weakness with acute minor illness, or weakness from poor fluid intake) Final Disposition User See Physician within 4 Hours (or PCP triage) Marcelline Deist, RN, Lynda Comments Caller states as he was sitting talking to the nurse, he feels much better & the weakness/lightheadness has disappeared.

## 2014-02-02 NOTE — Patient Instructions (Signed)

## 2014-02-02 NOTE — Progress Notes (Signed)
Subjective:    Patient ID: Alexander [name], male    DOB: 08/07/1940, 74 y.o.   MRN: [id number]  HPI Patient has a long history of recurrent chest pains with extensive workup previously with only minimal coronary disease. He was seen yesterday in ED with similar episode. EKG unremarkable. Chest x-ray no acute findings. Troponins and other labs including chemistries and BNP levels were all normal. He just completed event monitor and has follow-up with cardiology in 3 days. He has not had any chest pain whatsoever today. He comes in today with brief episodes morning when he first got up of some transient dizziness and nausea without vomiting. No syncope.  He has difficulty describing the dizziness but this sounds like transient vertigo. He did not describe any ataxia, speech changes, or swallowing difficulties. No focal weakness.  His other chronic problems include history of polycythemia vera. He had recent phlebotomy which he does routinely. He also has hypertension and hyperlipidemia. He is not describing any recent transient visual changes or TIA type symptoms.  Past Medical History  Diagnosis Date  . GERD (gastroesophageal reflux disease)   . Hypertension   . Microalbuminuria   . Lumbar back pain   . Hematuria, microscopic   . Hyperglycemia   . Diverticulosis of colon   . Hyperlipidemia   . Gout   . Tubular adenoma of colon 07/2012  . GI bleed     secondary to Aspirin  . Polycythemia rubra vera   . Hemorrhoids   . Arthritis     "minor; shoulders primarily" (05/20/2013)  . CAD (coronary artery disease)     a. 05/2013 - Chest pain/unstable angina and dynamic EKG changes showing cath showing atherosclerotic coronary artery disease manifested as diffuse ectasia, normal EF.  [name] Overweight(278.02)   . Pulmonary nodule     a. 05/2013 - seen on CXR.   Past Surgical History  Procedure Laterality Date  . Cataract extraction w/ intraocular lens  implant, bilateral Bilateral 2008  .  Excisional hemorrhoidectomy  1970's  . Cardiac catheterization  05/20/2013  . Inguinal hernia repair Right ~ 2010  . Tonsillectomy and adenoidectomy  1940's  . Cystectomy  ~ 2000    " SEBACEOUS cyst removed from between shoulder blades"  . Left heart catheterization with coronary angiogram N/A 05/20/2013    Procedure: LEFT HEART CATHETERIZATION WITH CORONARY ANGIOGRAM;  Surgeon: Peter M Martinique, MD;  Location: [facility] CATH LAB;  Service: Cardiovascular;  Laterality: N/A;    reports that he has never smoked. He has never used smokeless tobacco. He reports that he does not drink alcohol or use illicit drugs. family history includes Colon cancer in his mother; Diabetes in his father and mother; Heart disease (age of onset: 76) in his father; Stomach cancer in his maternal grandfather. Allergies  Allergen Reactions  . Metformin And Related Diarrhea      Review of Systems  Constitutional: Negative for fever and chills.  Cardiovascular: Positive for chest pain. Negative for palpitations and leg swelling.  Gastrointestinal: Positive for nausea. Negative for vomiting and abdominal pain.  Genitourinary: Negative for dysuria.  Neurological: Positive for dizziness. Negative for seizures and syncope.  Psychiatric/Behavioral: Negative for confusion.       Objective:   Physical Exam  Constitutional: He is oriented to person, place, and time. He appears well-developed and well-nourished.  HENT:  Mouth/Throat: Oropharynx is clear and moist.  Neck: Neck supple.  No bruits  Cardiovascular: Normal rate and regular rhythm.   Pulmonary/Chest: Effort  normal and breath sounds normal. No respiratory distress. He has no wheezes. He has no rales.  Musculoskeletal: He exhibits no edema.  Neurological: He is alert and oriented to person, place, and time. No cranial nerve deficit.  No focal weakness. Finger to nose testing is normal. Gait is normal  Psychiatric: He has a normal mood and affect. His behavior is  normal. Judgment and thought content normal.          Assessment & Plan:  Transient dizziness. By description, question transient vertigo. Suspect benign peripheral positional vertigo. No red flags for worrisome vertigo. Nonfocal exam at this point. Reassurance and observation. Continue follow-up with cardiology regarding his atypical chest symptoms

## 2014-02-02 NOTE — Progress Notes (Signed)
Pre visit review using our clinic review tool, if applicable. No additional management support is needed unless otherwise documented below in the visit note. 

## 2014-02-03 ENCOUNTER — Telehealth: Payer: Self-pay | Admitting: *Deleted

## 2014-02-03 NOTE — Telephone Encounter (Addendum)
Patient aware via MyChart  ----- Message from [name], MD sent at 02/02/2014  6:14 PM EST ----- Cal - erythropoitin level is a little low but not too bad!!  pete

## 2014-02-04 ENCOUNTER — Encounter: Payer: Self-pay | Admitting: Internal Medicine

## 2014-02-04 ENCOUNTER — Telehealth: Payer: Self-pay | Admitting: Internal Medicine

## 2014-02-04 DIAGNOSIS — J3489 Other specified disorders of nose and nasal sinuses: Secondary | ICD-10-CM

## 2014-02-04 NOTE — Telephone Encounter (Signed)
Unless he is having pus like discharge, fever, etc I would not advise antibiotic at this time.  Would consider Afrin nasal if he has nasal congestion prior to his flight.

## 2014-02-04 NOTE — Telephone Encounter (Addendum)
Pt would like cindy to return his call. Pt has been emailing dr Shawna Orleans

## 2014-02-04 NOTE — Telephone Encounter (Signed)
Pt states that he is having sinus pain and post nasal drip.  He seen Dr Elease Hashimoto on Monday and was told it was vertigo.  He is leaving for [place] on Monday and he is concerned about the flight with all the sinus pressure and pain.  Please advise if pt can have an antibiotic.

## 2014-02-05 DIAGNOSIS — J329 Chronic sinusitis, unspecified: Secondary | ICD-10-CM | POA: Diagnosis not present

## 2014-02-05 DIAGNOSIS — J342 Deviated nasal septum: Secondary | ICD-10-CM | POA: Diagnosis not present

## 2014-02-05 NOTE — Telephone Encounter (Signed)
Pt sent a message through mychart so I replied back

## 2014-02-05 NOTE — Telephone Encounter (Signed)
Pt is going to ENT per his request

## 2014-02-06 ENCOUNTER — Encounter: Payer: Self-pay | Admitting: Podiatry

## 2014-02-06 ENCOUNTER — Ambulatory Visit (INDEPENDENT_AMBULATORY_CARE_PROVIDER_SITE_OTHER): Payer: Medicare Other | Admitting: Cardiology

## 2014-02-06 ENCOUNTER — Encounter: Payer: Self-pay | Admitting: Cardiology

## 2014-02-06 ENCOUNTER — Ambulatory Visit (INDEPENDENT_AMBULATORY_CARE_PROVIDER_SITE_OTHER): Payer: Medicare Other | Admitting: Podiatry

## 2014-02-06 VITALS — BP 122/80 | HR 64 | Ht 69.0 in | Wt 192.0 lb

## 2014-02-06 DIAGNOSIS — D45 Polycythemia vera: Secondary | ICD-10-CM

## 2014-02-06 DIAGNOSIS — I10 Essential (primary) hypertension: Secondary | ICD-10-CM

## 2014-02-06 DIAGNOSIS — M79676 Pain in unspecified toe(s): Secondary | ICD-10-CM

## 2014-02-06 DIAGNOSIS — B351 Tinea unguium: Secondary | ICD-10-CM

## 2014-02-06 DIAGNOSIS — L84 Corns and callosities: Secondary | ICD-10-CM

## 2014-02-06 DIAGNOSIS — E785 Hyperlipidemia, unspecified: Secondary | ICD-10-CM | POA: Diagnosis not present

## 2014-02-06 MED ORDER — ASPIRIN EC 81 MG PO TBEC: 81.0000 mg | DELAYED_RELEASE_TABLET | Freq: Every day | ORAL | Status: DC

## 2014-02-06 NOTE — Patient Instructions (Addendum)
Please start ASA 81 mg a day.  Continue all other medications as listed.  Follow up in 6 months with Dr. Marlou Porch.  You will receive a letter in the mail 2 months before you are due.  Please call us when you receive this letter to schedule your follow up appointment.  Thank you for choosing [facility]!!

## 2014-02-06 NOTE — Progress Notes (Signed)
Cardiology Office Note   Date:  02/06/2014   ID:  Alexander [name], DOB Sep 01, 1940, MRN [id number]  PCP:  [name], DO  Cardiologist:  Dr. Candee Furbish      History of Present Illness: Alexander [name] is a 74 y.o. male with a hx of polycythemia vera, HTN, GERD, HL, CKD, prior GI bleed.  He was admitted 5/19-5/20 with chest pain.  He initially had inferior STE on ECG when EMS arrived.  ECG changes had resolved upon arrival to the ED.  CEs remained normal.  LHC demonstrated atherosclerotic CAD manifested as diffuse ectasia but no significant obstructive disease was noted. Had 30% left main plaque.  EF was normal.  Med Rx was recommended.  He returns for follow up.   Went on vacation. Felt great when got home. Moved a piano. BP has been elevated in the past. Only med is benicar. No statin. He does not like to take carvedilol. He also did not like to take his statin. He has been prescribed pravastatin but has not started. Last LDL was excellent.  02/06/14-event monitor reassuring with isolated bradycardia 40 at 5 AM. Isolated PVC. Average heart rate 65. Excellent. No atrial fibrillation.0 overall he feels well. He did go to the emergency room once in late January because of chest discomfort. Markers were normal. Reassurance. Remember, cardiac catheterization was overall reassuring. He is going to [place] next week.  Studies:  - LHC (05/20/13):  Proximal left main 30%, mildly ectatic distal left main, proximal LAD with mild ectasia, proximal circumflex with moderate ectasia, proximal, mid and distal RCA with severe ectasia, EF 55-65%.    - Echo (10/2008):  EF 60-65%, Gr 1 DD  - Nuclear (12/2012):  Normal study, EF 59%   CXR (05/20/13): IMPRESSION: The nodular opacity at the right lung base does not correspond with a nipple marker and is concerning for a pulmonary nodule. Recommend further evaluation with a nonemergent CT of the chest.   Chest CT (06/05/13): IMPRESSION: 1. No suspicious lung  nodule is seen. The area questioned by chest x-ray probably represents focal thickening along the major fissure adjacent to the right middle lobe anteriorly and laterally. 2. No mediastinal or hilar adenopathy.   Recent Labs: 05/20/2013: Pro B Natriuretic peptide (BNP) 22.4 07/10/2013: TSH 0.87 01/08/2014: HDL Cholesterol by NMR 39.70; LDL (calc) 108* 01/30/2014: ALT 14 02/01/2014: B Natriuretic Peptide 24.4; Creatinine 1.12; Hemoglobin 13.5; Potassium 4.1  Wt Readings from Last 3 Encounters:  02/06/14 192 lb (87.091 kg)  02/02/14 185 lb (83.915 kg)  01/30/14 193 lb (87.544 kg)     Past Medical History  Diagnosis Date  . GERD (gastroesophageal reflux disease)   . Hypertension   . Microalbuminuria   . Lumbar back pain   . Hematuria, microscopic   . Hyperglycemia   . Diverticulosis of colon   . Hyperlipidemia   . Gout   . Tubular adenoma of colon 07/2012  . GI bleed     secondary to Aspirin  . Polycythemia rubra vera   . Hemorrhoids   . Arthritis     "minor; shoulders primarily" (05/20/2013)  . CAD (coronary artery disease)     a. 05/2013 - Chest pain/unstable angina and dynamic EKG changes showing cath showing atherosclerotic coronary artery disease manifested as diffuse ectasia, normal EF.  [name] Overweight(278.02)   . Pulmonary nodule     a. 05/2013 - seen on CXR.    Current Outpatient Prescriptions  Medication Sig Dispense Refill  . ACCU-CHEK SOFTCLIX  LANCETS lancets Test 3 times a week 100 each 5  . allopurinol (ZYLOPRIM) 100 MG tablet Take 1 tablet (100 mg  total) by mouth daily. 90 tablet 3  . azelastine (ASTELIN) 0.1 % nasal spray Place 1 spray into both nostrils 2 (two) times daily. Use in each nostril as directed 90 mL 1  . BENICAR 20 MG tablet Take 1 tablet (20 mg total) by mouth daily. 90 tablet 3  . Coenzyme Q10 300 MG CAPS Take 1 capsule by mouth daily.     . Efinaconazole (JUBLIA) 10 % SOLN Apply 1 application topically daily. 8 mL 5  . glucose blood (ACCU-CHEK AVIVA)  test strip Test 3 times a week 100 each 5  . hydrocortisone (ANUSOL-HC) 25 MG suppository Place 1 suppository (25 mg total) rectally 2 (two) times daily. 120 suppository 0  . nitroGLYCERIN (NITROSTAT) 0.4 MG SL tablet Place 1 tablet (0.4 mg total) under the tongue every 5 (five) minutes as needed for chest pain (up to 3 doses). 25 tablet 3  . pantoprazole (PROTONIX) 40 MG tablet Take 40 mg by mouth daily.    . pravastatin (PRAVACHOL) 20 MG tablet Take 1 tablet (20 mg total) by mouth daily. 90 tablet 1  . PROCTOZONE-HC 2.5 % rectal cream Apply rectally two times  daily 60 g 3  . triamcinolone cream (KENALOG) 0.5 % Apply 1 application topically 2 (two) times daily. 15 g 0   No current facility-administered medications for this visit.    Allergies:   Metformin and related   Social History:  The patient  reports that he has never smoked. He has never used smokeless tobacco. He reports that he does not drink alcohol or use illicit drugs.   Family History:  The patient's family history includes Colon cancer in his mother; Diabetes in his father and mother; Heart disease (age of onset: 35) in his father; Stomach cancer in his maternal grandfather.   ROS:  Please see the history of present illness.      All other systems reviewed and negative.   PHYSICAL EXAM: VS:  BP 122/80 mmHg  Pulse 64  Ht 5\' 9"  (1.753 m)  Wt 192 lb (87.091 kg)  BMI 28.34 kg/m2 Well nourished, well developed, in no acute distress HEENT: normal Neck: no JVD Cardiac:  normal S1, S2; RRR; no murmur Lungs:  clear to auscultation bilaterally, no wheezing, rhonchi or rales Abd: soft, nontender, no hepatomegaly Ext: no edemaright wrist without hematoma or mass  Skin: warm and dry Neuro:  CNs 2-12 intact, no focal abnormalities noted  EKG:  Sinus brady, HR 48, NSSTTW changes     ASSESSMENT AND PLAN:  1. CAD (coronary artery disease): I'm fine with him taking low-dose aspirin. Diffuse ectasia noted on cardiac  catheterization without significant obstructive disease. Continue medical therapy with aspirin,off beta blocker-bradycardia, felt poor-I'm fine with this, statin. He has had no further chest pain. 2. HYPERTENSION: Controlled. Doing well. 3. HYPERLIPIDEMIA: Continue with start of pravastatin once medicine comes in the mail. This medication hopefully will be better tolerated. 4. POLYCYTHEMIA RUBRA VERA: Continue followup with hematology. 5. Pulmonary nodule: Followup chest CT without lung nodule. No further workup planned. 6. Bradycardia: He is asymptomatic. Stay off beta blocker. Continue current therapy. 7. Chest pain-likely noncardiac, GI. He is currently on Protonix. Doing well. 8. Disposition:  F/u in early February before strip to [place].    Signed, Candee Furbish, MD  02/06/2014 8:43 AM    [facility]  [address], [place], [place]  [address] Phone: [phone]; Fax: [phone]

## 2014-02-09 NOTE — Progress Notes (Signed)
Patient ID: Alexander [name], male   DOB: 1940-05-29, 74 y.o.   MRN: [id number]  Subjective: Presents today chief complaint of painful elongated toenails and calluses .He has continued with Jublia, but states he has not noticed much improvement. Denies any recent redness or drainage around the nails. No other complaints at this time.   Objective:  DP/PT pulses are palpable bilateral, CRT < 3 sec Protective sensation intact with SWMF; Achilles tendon reflex intact.  Nails are  Hypertrophic, dystrophic, discolored, brittle 10. No swelling erythema or drainage from the nail sites. There subjective tenderness on the palpation of the nails. Hyperkeratotic lesions bilateral medial hallux on the IPJ. Upon debridement no underlying ulceration no clinical signs of infection. No other areas of tenderness to bilateral lower extremities. No overlying edema, erythema, increase in warmth. No pain with calf compression, swelling, warmth, erythema.  Assessment:  Symptomatic onychomycosis/ hyperkerotic lesion x 2.   Plan: -Nails  Sharply debrided 10 without complication/bleeding. -Hyperkeratotic lesion sharply debrided 2 without complication/bleeding -At the next appointment he does not continue see much improvement with Jublia discussed with him possibly switching to [place] -Follow-up in 3 months or sooner should any problems arise. In the meantime, encouraged to call the office with any questions, concerns, change in symptoms.

## 2014-03-20 ENCOUNTER — Ambulatory Visit: Payer: Medicare Other

## 2014-03-20 ENCOUNTER — Encounter: Payer: Self-pay | Admitting: Internal Medicine

## 2014-03-20 ENCOUNTER — Other Ambulatory Visit (HOSPITAL_BASED_OUTPATIENT_CLINIC_OR_DEPARTMENT_OTHER): Payer: Medicare Other | Admitting: Lab

## 2014-03-20 ENCOUNTER — Ambulatory Visit (HOSPITAL_BASED_OUTPATIENT_CLINIC_OR_DEPARTMENT_OTHER): Payer: Medicare Other | Admitting: Hematology & Oncology

## 2014-03-20 ENCOUNTER — Encounter: Payer: Self-pay | Admitting: Hematology & Oncology

## 2014-03-20 VITALS — BP 133/56 | HR 50 | Temp 97.3°F | Resp 18 | Ht 69.0 in | Wt 195.0 lb

## 2014-03-20 DIAGNOSIS — D45 Polycythemia vera: Secondary | ICD-10-CM | POA: Diagnosis not present

## 2014-03-20 LAB — BASIC METABOLIC PANEL
BUN: 18 mg/dL (ref 6–23)
CHLORIDE: 103 meq/L (ref 96–112)
CO2: 26 meq/L (ref 19–32)
Calcium: 9.2 mg/dL (ref 8.4–10.5)
Creatinine, Ser: 1.16 mg/dL (ref 0.50–1.35)
Glucose, Bld: 107 mg/dL — ABNORMAL HIGH (ref 70–99)
Potassium: 4.9 mEq/L (ref 3.5–5.3)
Sodium: 139 mEq/L (ref 135–145)

## 2014-03-20 LAB — CBC WITH DIFFERENTIAL (CANCER CENTER ONLY)
BASO#: 0 10*3/uL (ref 0.0–0.2)
BASO%: 0.4 % (ref 0.0–2.0)
EOS ABS: 0.4 10*3/uL (ref 0.0–0.5)
EOS%: 5.2 % (ref 0.0–7.0)
HEMATOCRIT: 42.1 % (ref 38.7–49.9)
HEMOGLOBIN: 13.6 g/dL (ref 13.0–17.1)
LYMPH#: 2 10*3/uL (ref 0.9–3.3)
LYMPH%: 25.2 % (ref 14.0–48.0)
MCH: 28.1 pg (ref 28.0–33.4)
MCHC: 32.3 g/dL (ref 32.0–35.9)
MCV: 87 fL (ref 82–98)
MONO#: 1.2 10*3/uL — AB (ref 0.1–0.9)
MONO%: 14.6 % — ABNORMAL HIGH (ref 0.0–13.0)
NEUT#: 4.4 10*3/uL (ref 1.5–6.5)
NEUT%: 54.6 % (ref 40.0–80.0)
Platelets: 280 10*3/uL (ref 145–400)
RBC: 4.84 10*6/uL (ref 4.20–5.70)
RDW: 13.8 % (ref 11.1–15.7)
WBC: 8.1 10*3/uL (ref 4.0–10.0)

## 2014-03-20 NOTE — Progress Notes (Signed)
No phlebotomy today per dr. ennever 

## 2014-03-20 NOTE — Progress Notes (Signed)
Hematology and Oncology Follow Up Visit  Alexander [name] [id number] 12-28-1940 74 y.o. 03/20/2014   Principle Diagnosis:  Polycythemia vera-JAK2 negative  Current Therapy:    Phlebotomy to maintain hematocrit below 45%  Aspirin 81 mg by mouth daily     Interim History:  [name] is in for a followup. He feels well. He and his wife discovered back from [place]. They go there once a year. He had a great time over.  He says he has not felt this well for long time. He's had no chest pain. He's had no shortness of breath. He's had no nausea or vomiting. There's been no change in bowel or bladder habits.  He did wear a Holter monitor were last saw him. The heart doctor could not find anything abnormal with the Holter monitor.  He's had no leg swelling. He's had no rashes.    Medications:  Current outpatient prescriptions:  .  ACCU-CHEK SOFTCLIX LANCETS lancets, Test 3 times a week, Disp: 100 each, Rfl: 5 .  allopurinol (ZYLOPRIM) 100 MG tablet, Take 1 tablet (100 mg  total) by mouth daily., Disp: 90 tablet, Rfl: 3 .  aspirin EC 81 MG tablet, Take 1 tablet (81 mg total) by mouth daily., Disp: 90 tablet, Rfl: 3 .  azelastine (ASTELIN) 0.1 % nasal spray, Place 1 spray into both nostrils 2 (two) times daily. Use in each nostril as directed, Disp: 90 mL, Rfl: 1 .  BENICAR 20 MG tablet, Take 1 tablet (20 mg total) by mouth daily., Disp: 90 tablet, Rfl: 3 .  Coenzyme Q10 300 MG CAPS, Take 1 capsule by mouth daily. , Disp: , Rfl:  .  Efinaconazole (JUBLIA) 10 % SOLN, Apply 1 application topically daily., Disp: 8 mL, Rfl: 5 .  glucose blood (ACCU-CHEK AVIVA) test strip, Test 3 times a week, Disp: 100 each, Rfl: 5 .  hydrocortisone (ANUSOL-HC) 25 MG suppository, Place 1 suppository (25 mg total) rectally 2 (two) times daily., Disp: 120 suppository, Rfl: 0 .  nitroGLYCERIN (NITROSTAT) 0.4 MG SL tablet, Place 1 tablet (0.4 mg total) under the tongue every 5 (five) minutes as needed for chest  pain (up to 3 doses)., Disp: 25 tablet, Rfl: 3 .  pantoprazole (PROTONIX) 40 MG tablet, Take 40 mg by mouth daily., Disp: , Rfl:  .  pravastatin (PRAVACHOL) 20 MG tablet, Take 1 tablet (20 mg total) by mouth daily., Disp: 90 tablet, Rfl: 1 .  PROCTOZONE-HC 2.5 % rectal cream, Apply rectally two times  daily, Disp: 60 g, Rfl: 3 .  triamcinolone cream (KENALOG) 0.5 %, Apply 1 application topically 2 (two) times daily., Disp: 15 g, Rfl: 0  Allergies:  Allergies  Allergen Reactions  . Metformin And Related Diarrhea    Past Medical History, Surgical history, Social history, and Family History were reviewed and updated.  Review of Systems: As above  Physical Exam:  height is 5\' 9"  (1.753 m) and weight is 195 lb (88.451 kg). His oral temperature is 97.3 F (36.3 C). His blood pressure is 133/56 and his pulse is 50. His respiration is 18.   Well-developed and well-nourished gentleman. Head and neck exam shows no ocular or oral lesions. He has no palpable cervical or supraclavicular lymph nodes. His lungs are clear with no rales, wheezes or rhonchi. Cardiac exam regular rate and rhythm with no murmurs, rubs or bruits.. Abdomen is soft. He has good bowel sounds. There is no fluid wave. There is no palpable liver or spleen tip. Back  exam shows no tenderness over the spine, ribs or hips. Extremities shows no clubbing cyanosis or edema. He has good range of motion of his joints. Has good strength in his extremities. Skin exam no rashes, ecchymoses or petechia. Neurological exam is nonfocal.  Lab Results  Component Value Date   WBC 8.1 03/20/2014   HGB 13.6 03/20/2014   HCT 42.1 03/20/2014   MCV 87 03/20/2014   PLT 280 03/20/2014     Chemistry      Component Value Date/Time   NA 138 02/01/2014 0753   NA 143 01/30/2014 0856   K 4.1 02/01/2014 0753   K 3.8 01/30/2014 0856   CL 105 02/01/2014 0753   CL 102 01/30/2014 0856   CO2 23 02/01/2014 0753   CO2 28 01/30/2014 0856   BUN 19 02/01/2014  0753   BUN 26* 01/30/2014 0856   CREATININE 1.12 02/01/2014 0753   CREATININE 1.1 01/30/2014 0856      Component Value Date/Time   CALCIUM 9.0 02/01/2014 0753   CALCIUM 9.1 01/30/2014 0856   ALKPHOS 82 01/30/2014 0856   ALKPHOS 78 01/08/2014 0827   AST 20 01/30/2014 0856   AST 14 01/08/2014 0827   ALT 14 01/30/2014 0856   ALT 12 01/08/2014 0827   BILITOT 0.70 01/30/2014 0856   BILITOT 0.6 01/08/2014 0827         Impression and Plan: Alexander [name] is 74 year old gentleman. He has polycythemia. We have done a good job in [place] him and keeping his blood counts adequate.   He does not need any phlebotomies today. I'm just glad that his blood count is down. I think this will contribute to him feeling better.  For now, I will plan to have come back in another 3 months.   [name], MD 3/18/20163:53 PM

## 2014-03-23 LAB — IRON AND TIBC CHCC
%SAT: 10 % — ABNORMAL LOW (ref 20–55)
IRON: 38 ug/dL — AB (ref 42–163)
TIBC: 368 ug/dL (ref 202–409)
UIBC: 330 ug/dL (ref 117–376)

## 2014-03-23 LAB — FERRITIN CHCC: Ferritin: 18 ng/ml — ABNORMAL LOW (ref 22–316)

## 2014-03-23 LAB — ERYTHROPOIETIN: ERYTHROPOIETIN: 27.4 m[IU]/mL — AB (ref 2.6–18.5)

## 2014-03-30 ENCOUNTER — Encounter: Payer: Self-pay | Admitting: Podiatry

## 2014-03-30 ENCOUNTER — Encounter: Payer: Self-pay | Admitting: Internal Medicine

## 2014-04-07 ENCOUNTER — Encounter: Payer: Self-pay | Admitting: Internal Medicine

## 2014-04-07 ENCOUNTER — Encounter: Payer: Self-pay | Admitting: Podiatry

## 2014-04-08 ENCOUNTER — Other Ambulatory Visit: Payer: Self-pay | Admitting: Podiatry

## 2014-04-08 MED ORDER — TAVABOROLE 5 % EX SOLN: 1.0000 [drp] | CUTANEOUS | Status: DC

## 2014-04-10 ENCOUNTER — Other Ambulatory Visit: Payer: Self-pay | Admitting: Internal Medicine

## 2014-04-15 ENCOUNTER — Other Ambulatory Visit (INDEPENDENT_AMBULATORY_CARE_PROVIDER_SITE_OTHER): Payer: Medicare Other

## 2014-04-15 DIAGNOSIS — I10 Essential (primary) hypertension: Secondary | ICD-10-CM | POA: Diagnosis not present

## 2014-04-15 DIAGNOSIS — E119 Type 2 diabetes mellitus without complications: Secondary | ICD-10-CM | POA: Diagnosis not present

## 2014-04-15 LAB — BASIC METABOLIC PANEL
BUN: 18 mg/dL (ref 6–23)
CO2: 28 mEq/L (ref 19–32)
Calcium: 9.1 mg/dL (ref 8.4–10.5)
Chloride: 106 mEq/L (ref 96–112)
Creatinine, Ser: 1.2 mg/dL (ref 0.40–1.50)
GFR: 62.88 mL/min (ref >60.00)
Glucose, Bld: 102 mg/dL — ABNORMAL HIGH (ref 70–99)
Potassium: 4.5 mEq/L (ref 3.5–5.1)
Sodium: 138 mEq/L (ref 135–145)

## 2014-04-15 LAB — HEMOGLOBIN A1C: HEMOGLOBIN A1C: 6.2 % (ref 4.6–6.5)

## 2014-04-22 ENCOUNTER — Ambulatory Visit (INDEPENDENT_AMBULATORY_CARE_PROVIDER_SITE_OTHER): Payer: Medicare Other | Admitting: Internal Medicine

## 2014-04-22 ENCOUNTER — Encounter: Payer: Self-pay | Admitting: Internal Medicine

## 2014-04-22 VITALS — BP 130/88 | HR 48 | Temp 98.4°F | Wt 192.1 lb

## 2014-04-22 DIAGNOSIS — R7309 Other abnormal glucose: Secondary | ICD-10-CM | POA: Diagnosis not present

## 2014-04-22 DIAGNOSIS — E785 Hyperlipidemia, unspecified: Secondary | ICD-10-CM

## 2014-04-22 DIAGNOSIS — Z23 Encounter for immunization: Secondary | ICD-10-CM | POA: Diagnosis not present

## 2014-04-22 DIAGNOSIS — R7303 Prediabetes: Secondary | ICD-10-CM

## 2014-04-22 DIAGNOSIS — I10 Essential (primary) hypertension: Secondary | ICD-10-CM

## 2014-04-22 MED ORDER — PRAVASTATIN SODIUM 40 MG PO TABS: 40.0000 mg | ORAL_TABLET | Freq: Every day | ORAL | Status: DC

## 2014-04-22 NOTE — Patient Instructions (Signed)
Please complete the following lab tests before your next follow up appointment: FLP, LFTs - 272.4 BMET - 401.9 Transition from Protonix to over the counter ranitidine 150 mg as directed

## 2014-04-22 NOTE — Assessment & Plan Note (Signed)
BP is stable.  Continue same dose of Benicar. BP: 130/88 mmHg  Lab Results  Component Value Date   NA 138 04/15/2014   K 4.5 04/15/2014   CL 106 04/15/2014   CO2 28 04/15/2014   Lab Results  Component Value Date   CREATININE 1.20 04/15/2014

## 2014-04-22 NOTE — Assessment & Plan Note (Addendum)
Simvastatin switched to pravastatin after patient experienced fatigue and muscle aches.  He is tolerating pravastatin but LDL not at goal.  Increase pravastatin to 40 mg.  FLP and LFTs before next office visit.

## 2014-04-22 NOTE — Assessment & Plan Note (Signed)
A1c is stable.  Managed with diet and exercise.  Lab Results  Component Value Date   HGBA1C 6.2 04/15/2014

## 2014-04-22 NOTE — Progress Notes (Signed)
Subjective:    Patient ID: Alexander [name], male    DOB: 1940/01/05, 74 y.o.   MRN: [id number]  HPI  74 year old white male with history of polycythemia vera, hypertension, hyperlipidemia for routine follow-up. Patient denies significant interval medical history. He had good follow-up visit with cardiologist. His Holter monitor was unrevealing.  Abnormal glucose and follow low-carb diet and exercises on a regular basis. His A1c is stable.    Review of Systems Negative for chest pain or shortness of breath    Past Medical History  Diagnosis Date  . GERD (gastroesophageal reflux disease)   . Hypertension   . Microalbuminuria   . Lumbar back pain   . Hematuria, microscopic   . Hyperglycemia   . Diverticulosis of colon   . Hyperlipidemia   . Gout   . Tubular adenoma of colon 07/2012  . GI bleed     secondary to Aspirin  . Polycythemia rubra vera   . Hemorrhoids   . Arthritis     "minor; shoulders primarily" (05/20/2013)  . CAD (coronary artery disease)     a. 05/2013 - Chest pain/unstable angina and dynamic EKG changes showing cath showing atherosclerotic coronary artery disease manifested as diffuse ectasia, normal EF.  [name] Overweight(278.02)   . Pulmonary nodule     a. 05/2013 - seen on CXR.    History   Social History  . Marital Status: Married    Spouse Name: N/A  . Number of Children: 3  . Years of Education: N/A   Occupational History  . retired Chief Financial Officer    Social History Main Topics  . Smoking status: Never Smoker   . Smokeless tobacco: Never Used     Comment: NEVER USED TOBACCO  . Alcohol Use: No  . Drug Use: No  . Sexual Activity:    Partners: Female   Other Topics Concern  . Not on file   Social History Narrative   Daughter, [name] (pediatrician--lives in [place], [place])    Past Surgical History  Procedure Laterality Date  . Cataract extraction w/ intraocular lens  implant, bilateral Bilateral 2008  . Excisional hemorrhoidectomy  1970's  .  Cardiac catheterization  05/20/2013  . Inguinal hernia repair Right ~ 2010  . Tonsillectomy and adenoidectomy  1940's  . Cystectomy  ~ 2000    " SEBACEOUS cyst removed from between shoulder blades"  . Left heart catheterization with coronary angiogram N/A 05/20/2013    Procedure: LEFT HEART CATHETERIZATION WITH CORONARY ANGIOGRAM;  Surgeon: Peter M Martinique, MD;  Location: [facility] CATH LAB;  Service: Cardiovascular;  Laterality: N/A;    Family History  Problem Relation Age of Onset  . Colon cancer Mother     died at 44, colorectal  . Heart disease Father 51    MI  . Diabetes Father   . Diabetes Mother   . Stomach cancer Maternal Grandfather     Allergies  Allergen Reactions  . Metformin And Related Diarrhea    Current Outpatient Prescriptions on File Prior to Visit  Medication Sig Dispense Refill  . ACCU-CHEK SOFTCLIX LANCETS lancets Test 3 times a week 100 each 5  . allopurinol (ZYLOPRIM) 100 MG tablet Take 1 tablet (100 mg  total) by mouth daily. 90 tablet 3  . aspirin EC 81 MG tablet Take 1 tablet (81 mg total) by mouth daily. 90 tablet 3  . azelastine (ASTELIN) 0.1 % nasal spray Place 1 spray into both nostrils 2 (two) times daily. Use in each nostril as directed  90 mL 1  . BENICAR 20 MG tablet Take 1 tablet (20 mg total) by mouth daily. 90 tablet 3  . Coenzyme Q10 300 MG CAPS Take 1 capsule by mouth daily.     [name] glucose blood (ACCU-CHEK AVIVA) test strip Test 3 times a week 100 each 5  . hydrocortisone (ANUSOL-HC) 25 MG suppository Place 1 suppository (25 mg total) rectally 2 (two) times daily. 120 suppository 0  . nitroGLYCERIN (NITROSTAT) 0.4 MG SL tablet Place 1 tablet (0.4 mg total) under the tongue every 5 (five) minutes as needed for chest pain (up to 3 doses). 25 tablet 3  . pantoprazole (PROTONIX) 40 MG tablet Take 40 mg by mouth daily.    . pantoprazole (PROTONIX) 40 MG tablet Take 1 tablet by mouth  daily 90 tablet 0  . pravastatin (PRAVACHOL) 20 MG tablet Take 1 tablet by  mouth  daily 90 tablet 0  . PROCTOZONE-HC 2.5 % rectal cream Apply rectally two times  daily 60 g 3  . Tavaborole (KERYDIN) 5 % SOLN Apply 1 drop topically 1 day or 1 dose. Apply 1 drop to the toenail daily. 10 mL 2  . triamcinolone cream (KENALOG) 0.5 % Apply 1 application topically 2 (two) times daily. 15 g 0  . Efinaconazole (JUBLIA) 10 % SOLN Apply 1 application topically daily. (Patient not taking: Reported on 04/22/2014) 8 mL 5   No current facility-administered medications on file prior to visit.    BP 130/88 mmHg  Pulse 48  Temp(Src) 98.4 F (36.9 C) (Oral)  Wt 192 lb 1.6 oz (87.136 kg)     Objective:   Physical Exam  Constitutional: He is oriented to person, place, and time. He appears well-developed and well-nourished. No distress.  Cardiovascular: Normal rate, regular rhythm and normal heart sounds.   No murmur heard. Pulmonary/Chest: Effort normal and breath sounds normal. He has no wheezes.  Musculoskeletal: He exhibits no edema.  Neurological: He is alert and oriented to person, place, and time. No cranial nerve deficit. Coordination normal.  Psychiatric: He has a normal mood and affect. His behavior is normal.          Assessment & Plan:

## 2014-04-30 ENCOUNTER — Encounter: Payer: Self-pay | Admitting: Gastroenterology

## 2014-05-07 ENCOUNTER — Ambulatory Visit (INDEPENDENT_AMBULATORY_CARE_PROVIDER_SITE_OTHER): Payer: Medicare Other | Admitting: Podiatry

## 2014-05-07 ENCOUNTER — Encounter: Payer: Self-pay | Admitting: Podiatry

## 2014-05-07 DIAGNOSIS — B351 Tinea unguium: Secondary | ICD-10-CM

## 2014-05-07 DIAGNOSIS — M79676 Pain in unspecified toe(s): Secondary | ICD-10-CM | POA: Diagnosis not present

## 2014-05-07 DIAGNOSIS — L84 Corns and callosities: Secondary | ICD-10-CM

## 2014-05-07 NOTE — Progress Notes (Signed)
Presents today chief complaint of painful elongated toenails.  Objective: Pulses are palpable bilateral nails are thick, yellow dystrophic onychomycosis and painful palpation.   Assessment: Onychomycosis with pain in limb.  Plan: Treatment of nails in thickness and length as covered service secondary to pain.  

## 2014-05-10 ENCOUNTER — Encounter: Payer: Self-pay | Admitting: Internal Medicine

## 2014-05-14 ENCOUNTER — Other Ambulatory Visit: Payer: Self-pay | Admitting: Internal Medicine

## 2014-06-19 ENCOUNTER — Other Ambulatory Visit (HOSPITAL_BASED_OUTPATIENT_CLINIC_OR_DEPARTMENT_OTHER): Payer: Medicare Other

## 2014-06-19 ENCOUNTER — Encounter: Payer: Medicare Other | Admitting: Hematology & Oncology

## 2014-06-19 DIAGNOSIS — D751 Secondary polycythemia: Secondary | ICD-10-CM | POA: Diagnosis present

## 2014-06-19 DIAGNOSIS — D45 Polycythemia vera: Secondary | ICD-10-CM

## 2014-06-19 LAB — CBC WITH DIFFERENTIAL (CANCER CENTER ONLY)
BASO#: 0 10*3/uL (ref 0.0–0.2)
BASO%: 0.4 % (ref 0.0–2.0)
EOS%: 3.1 % (ref 0.0–7.0)
Eosinophils Absolute: 0.3 10*3/uL (ref 0.0–0.5)
HEMATOCRIT: 42.4 % (ref 38.7–49.9)
HGB: 13.8 g/dL (ref 13.0–17.1)
LYMPH#: 2.3 10*3/uL (ref 0.9–3.3)
LYMPH%: 26 % (ref 14.0–48.0)
MCH: 26.7 pg — AB (ref 28.0–33.4)
MCHC: 32.5 g/dL (ref 32.0–35.9)
MCV: 82 fL (ref 82–98)
MONO#: 1.3 10*3/uL — AB (ref 0.1–0.9)
MONO%: 14.4 % — AB (ref 0.0–13.0)
NEUT#: 5 10*3/uL (ref 1.5–6.5)
NEUT%: 56.1 % (ref 40.0–80.0)
Platelets: 216 10*3/uL (ref 145–400)
RBC: 5.17 10*6/uL (ref 4.20–5.70)
RDW: 16.4 % — AB (ref 11.1–15.7)
WBC: 9 10*3/uL (ref 4.0–10.0)

## 2014-06-19 LAB — BASIC METABOLIC PANEL - CANCER CENTER ONLY
BUN: 16 mg/dL (ref 7–22)
CHLORIDE: 104 meq/L (ref 98–108)
CO2: 28 mEq/L (ref 18–33)
CREATININE: 1.5 mg/dL — AB (ref 0.6–1.2)
Calcium: 9.3 mg/dL (ref 8.0–10.3)
Glucose, Bld: 98 mg/dL (ref 73–118)
POTASSIUM: 4.9 meq/L — AB (ref 3.3–4.7)
Sodium: 139 mEq/L (ref 128–145)

## 2014-06-23 ENCOUNTER — Ambulatory Visit (HOSPITAL_BASED_OUTPATIENT_CLINIC_OR_DEPARTMENT_OTHER): Payer: Medicare Other | Admitting: Hematology & Oncology

## 2014-06-23 ENCOUNTER — Encounter: Payer: Self-pay | Admitting: Hematology & Oncology

## 2014-06-23 VITALS — BP 150/85 | HR 54 | Temp 98.4°F | Resp 16 | Ht 69.0 in | Wt 199.0 lb

## 2014-06-23 DIAGNOSIS — D751 Secondary polycythemia: Secondary | ICD-10-CM

## 2014-06-23 DIAGNOSIS — D45 Polycythemia vera: Secondary | ICD-10-CM

## 2014-06-23 NOTE — Progress Notes (Signed)
Hematology and Oncology Follow Up Visit  Alexander [name] [id number] September 26, 1940 74 y.o. 06/23/2014   Principle Diagnosis:  Polycythemia vera-JAK2 negative  Current Therapy:    Phlebotomy to maintain hematocrit below 45%  Aspirin 81 mg by mouth daily     Interim History:  [name] is in for a followup. He feels okay. He had a tough time over the weekend. He does not feel all that well.  His wife is having some problems. She has had breast cancer. She had a mastectomy. She is having some breast causalgia pain.  He is trying to swim. He likes to swim  during the summertime.  He has had no chest pain. He's had no potassium issues. He has had no change in bowel or bladder habits. There's been no leg swelling. He has had no rashes.  Overall, his performance status is ECOG 1.  Medications:  Current outpatient prescriptions:  .  ACCU-CHEK SOFTCLIX LANCETS lancets, Test 3 times a week, Disp: 100 each, Rfl: 5 .  allopurinol (ZYLOPRIM) 100 MG tablet, Take 1 tablet (100 mg  total) by mouth daily., Disp: 90 tablet, Rfl: 3 .  aspirin EC 81 MG tablet, Take 1 tablet (81 mg total) by mouth daily., Disp: 90 tablet, Rfl: 3 .  azelastine (ASTELIN) 0.1 % nasal spray, Place 1 spray into both nostrils 2 (two) times daily. Use in each nostril as directed, Disp: 90 mL, Rfl: 1 .  BENICAR 20 MG tablet, Take 1 tablet (20 mg total) by mouth daily., Disp: 90 tablet, Rfl: 3 .  Coenzyme Q10 300 MG CAPS, Take 1 capsule by mouth daily. , Disp: , Rfl:  .  glucose blood (ACCU-CHEK AVIVA) test strip, Test 3 times a week, Disp: 100 each, Rfl: 5 .  hydrocortisone (ANUSOL-HC) 25 MG suppository, Place 1 suppository (25 mg total) rectally 2 (two) times daily., Disp: 120 suppository, Rfl: 0 .  nitroGLYCERIN (NITROSTAT) 0.4 MG SL tablet, Place 1 tablet (0.4 mg total) under the tongue every 5 (five) minutes as needed for chest pain (up to 3 doses)., Disp: 25 tablet, Rfl: 3 .  pantoprazole (PROTONIX) 40 MG tablet, Take  1 tablet by mouth  daily, Disp: 90 tablet, Rfl: 1 .  pravastatin (PRAVACHOL) 40 MG tablet, Take 1 tablet (40 mg total) by mouth daily., Disp: 90 tablet, Rfl: 3 .  PROCTOZONE-HC 2.5 % rectal cream, Apply rectally two times  daily, Disp: 60 g, Rfl: 3 .  Tavaborole (KERYDIN) 5 % SOLN, Apply 1 drop topically 1 day or 1 dose. Apply 1 drop to the toenail daily., Disp: 10 mL, Rfl: 2 .  triamcinolone cream (KENALOG) 0.5 %, Apply 1 application topically 2 (two) times daily., Disp: 15 g, Rfl: 0  Allergies:  Allergies  Allergen Reactions  . Metformin And Related Diarrhea    Past Medical History, Surgical history, Social history, and Family History were reviewed and updated.  Review of Systems: As above  Physical Exam:  height is 5\' 9"  (1.753 m) and weight is 199 lb (90.266 kg). His oral temperature is 98.4 F (36.9 C). His blood pressure is 150/85 and his pulse is 54. His respiration is 16.   Well-developed and well-nourished gentleman. Head and neck exam shows no ocular or oral lesions. He has no palpable cervical or supraclavicular lymph nodes. His lungs are clear with no rales, wheezes or rhonchi. Cardiac exam regular rate and rhythm with no murmurs, rubs or bruits.. Abdomen is soft. He has good bowel sounds. There is  no fluid wave. There is no palpable liver or spleen tip. Back exam shows no tenderness over the spine, ribs or hips. Extremities shows no clubbing cyanosis or edema. He has good range of motion of his joints. Has good strength in his extremities. Skin exam no rashes, ecchymoses or petechia. Neurological exam is nonfocal.  Lab Results  Component Value Date   WBC 9.0 06/19/2014   HGB 13.8 06/19/2014   HCT 42.4 06/19/2014   MCV 82 06/19/2014   PLT 216 06/19/2014     Chemistry      Component Value Date/Time   NA 139 06/19/2014 1421   NA 138 04/15/2014 0833   K 4.9* 06/19/2014 1421   K 4.5 04/15/2014 0833   CL 104 06/19/2014 1421   CL 106 04/15/2014 0833   CO2 28 06/19/2014  1421   CO2 28 04/15/2014 0833   BUN 16 06/19/2014 1421   BUN 18 04/15/2014 0833   CREATININE 1.5* 06/19/2014 1421   CREATININE 1.20 04/15/2014 0833      Component Value Date/Time   CALCIUM 9.3 06/19/2014 1421   CALCIUM 9.1 04/15/2014 0833   ALKPHOS 82 01/30/2014 0856   ALKPHOS 78 01/08/2014 0827   AST 20 01/30/2014 0856   AST 14 01/08/2014 0827   ALT 14 01/30/2014 0856   ALT 12 01/08/2014 0827   BILITOT 0.70 01/30/2014 0856   BILITOT 0.6 01/08/2014 0827         Impression and Plan: Alexander [name] is 74 year old gentleman. He has polycythemia. We have done a good job in [place] him and keeping his blood counts adequate.   He does not need any phlebotomies today. I'm just glad that his blood count is down. For now, I will plan to have come back in another 3 months.   [name], MD 6/21/20164:29 PM

## 2014-07-04 ENCOUNTER — Other Ambulatory Visit: Payer: Self-pay | Admitting: Internal Medicine

## 2014-07-16 ENCOUNTER — Telehealth: Payer: Self-pay | Admitting: *Deleted

## 2014-07-16 MED ORDER — TAVABOROLE 5 % EX SOLN: 1.0000 [drp] | CUTANEOUS | Status: DC

## 2014-07-16 NOTE — Telephone Encounter (Signed)
[facility] faxed refill request of Kerydin 5%.  Dr. Jacqualyn Posey states refill prn 1 year.  Done.

## 2014-08-13 ENCOUNTER — Ambulatory Visit (INDEPENDENT_AMBULATORY_CARE_PROVIDER_SITE_OTHER): Payer: Medicare Other | Admitting: Podiatry

## 2014-08-13 DIAGNOSIS — M79673 Pain in unspecified foot: Secondary | ICD-10-CM

## 2014-08-13 DIAGNOSIS — B351 Tinea unguium: Secondary | ICD-10-CM | POA: Diagnosis not present

## 2014-08-13 NOTE — Progress Notes (Signed)
He presents today for follow-up of his onychomycosis. He states that the current topical anti-fungal that we are using [place] Hoop seems to be working. He states that his toenails appear to be getting better but is still painful with debridement.  Objective: Vital signs are stable he is alert and oriented 3. Pulses are palpable bilateral. Neurologic sensorium is intact per Semmes-Weinstein monofilament. Deep tendon reflexes are intact bilateral and muscle strength is 5 over 5 dorsiflexion plantar flexors and inverters everters on physical musculatures intact. Orthopedic evaluation is unchanged. Cutaneous evaluation demonstrates supple well-hydrated cutis nail plates do appear to be getting better. Better extremely painful on palpation as well as debridement.  Assessment: Pain limb secondary to onychomycosis.  Plan: Continue the use of the topical anti-fungal and debrided nails 1 through 5 bilateral cover service secondary to pain.

## 2014-08-17 ENCOUNTER — Other Ambulatory Visit: Payer: Self-pay | Admitting: Internal Medicine

## 2014-08-17 ENCOUNTER — Other Ambulatory Visit (INDEPENDENT_AMBULATORY_CARE_PROVIDER_SITE_OTHER): Payer: Medicare Other

## 2014-08-17 DIAGNOSIS — E785 Hyperlipidemia, unspecified: Secondary | ICD-10-CM

## 2014-08-17 LAB — LIPID PANEL
CHOL/HDL RATIO: 4
Cholesterol: 141 mg/dL (ref 0–200)
HDL: 36.6 mg/dL — ABNORMAL LOW (ref >39.00)
LDL Cholesterol: 77 mg/dL (ref 0–99)
NonHDL: 104.15
Triglycerides: 138 mg/dL (ref 0.0–149.0)
VLDL: 27.6 mg/dL (ref 0.0–40.0)

## 2014-08-17 LAB — HEPATIC FUNCTION PANEL
ALBUMIN: 3.8 g/dL (ref 3.5–5.2)
ALT: 11 U/L (ref 0–53)
AST: 13 U/L (ref 0–37)
Alkaline Phosphatase: 78 U/L (ref 39–117)
BILIRUBIN TOTAL: 0.4 mg/dL (ref 0.2–1.2)
Bilirubin, Direct: 0.1 mg/dL (ref 0.0–0.3)
Total Protein: 6.5 g/dL (ref 6.0–8.3)

## 2014-08-24 ENCOUNTER — Ambulatory Visit: Payer: PRIVATE HEALTH INSURANCE | Admitting: Internal Medicine

## 2014-09-04 ENCOUNTER — Ambulatory Visit (INDEPENDENT_AMBULATORY_CARE_PROVIDER_SITE_OTHER): Payer: Medicare Other | Admitting: Cardiology

## 2014-09-04 ENCOUNTER — Encounter: Payer: Self-pay | Admitting: Cardiology

## 2014-09-04 VITALS — BP 130/76 | HR 53 | Ht 71.0 in | Wt 194.8 lb

## 2014-09-04 DIAGNOSIS — I2583 Coronary atherosclerosis due to lipid rich plaque: Principal | ICD-10-CM

## 2014-09-04 DIAGNOSIS — I10 Essential (primary) hypertension: Secondary | ICD-10-CM | POA: Diagnosis not present

## 2014-09-04 DIAGNOSIS — R001 Bradycardia, unspecified: Secondary | ICD-10-CM

## 2014-09-04 DIAGNOSIS — I2510 Atherosclerotic heart disease of native coronary artery without angina pectoris: Secondary | ICD-10-CM

## 2014-09-04 NOTE — Patient Instructions (Signed)
Medication Instructions:  Your physician recommends that you continue on your current medications as directed. Please refer to the Current Medication list given to you today.  Follow-Up: Follow up in 1 year with Dr. Skains.  You will receive a letter in the mail 2 months before you are due.  Please call us when you receive this letter to schedule your follow up appointment.  Thank you for choosing [facility]!!       

## 2014-09-04 NOTE — Progress Notes (Signed)
Cardiology Office Note   Date:  09/04/2014   ID:  Alexander [name], DOB 01/25/1940, MRN [id number]  PCP:  Alexander Pry, DO  Cardiologist:  Dr. Candee [name]      History of Present Illness: Alexander [name] is a 74 y.o. male with a hx of polycythemia vera, HTN, GERD, HL, CKD, prior GI bleed.  He was admitted 5/19-5/20 with chest pain.  He initially had inferior STE on ECG when EMS arrived.  ECG changes had resolved upon arrival to the ED.  CEs remained normal.  LHC demonstrated atherosclerotic CAD manifested as diffuse ectasia but no significant obstructive disease was noted. Had 30% left main plaque.  EF was normal.  Med Rx was recommended.  He returns for follow up.   Went on vacation. Felt great when got home. Moved a piano. BP has been elevated in the past. Only med is benicar. No statin. He does not like to take carvedilol. He also did not like to take his statin. He has been prescribed pravastatin but has not started. Last LDL was excellent.  02/06/14-event monitor reassuring with isolated bradycardia 40 at 5 AM. Isolated PVC. Average heart rate 65. Excellent. No atrial fibrillation.0 overall he feels well. He did go to the emergency room once in late January because of chest discomfort. Markers were normal. Reassurance. Remember, cardiac catheterization was overall reassuring. He is going to [place] next week.  09/04/14 - Statin was increased from 20- 40. He is doing very well. He feels that his muscles are little bit stiffer than usual and he is willing to tolerate this. His last LDL was 77. Excellent. He is swimming one hour and a half a day. Waterproof headphones. No chest pain.  Studies:  - LHC (05/20/13):  Proximal left main 30%, mildly ectatic distal left main, proximal LAD with mild ectasia, proximal circumflex with moderate ectasia, proximal, mid and distal RCA with severe ectasia, EF 55-65%.    - Echo (10/2008):  EF 60-65%, Gr 1 DD  - Nuclear (12/2012):  Normal study, EF 59%   CXR  (05/20/13): IMPRESSION: The nodular opacity at the right lung base does not correspond with a nipple marker and is concerning for a pulmonary nodule. Recommend further evaluation with a nonemergent CT of the chest.   Chest CT (06/05/13): IMPRESSION: 1. No suspicious lung nodule is seen. The area questioned by chest x-ray probably represents focal thickening along the major fissure adjacent to the right middle lobe anteriorly and laterally. 2. No mediastinal or hilar adenopathy.   Recent Labs: 02/01/2014: B Natriuretic Peptide 24.4 06/19/2014: Creat 1.5*; HGB 13.8; Potassium 4.9* 08/17/2014: ALT 11; HDL 36.60*; LDL Cholesterol 77  Wt Readings from Last 3 Encounters:  09/04/14 194 lb 12.8 oz (88.361 kg)  06/23/14 199 lb (90.266 kg)  06/19/14 194 lb (87.998 kg)     Past Medical History  Diagnosis Date  . GERD (gastroesophageal reflux disease)   . Hypertension   . Microalbuminuria   . Lumbar back pain   . Hematuria, microscopic   . Hyperglycemia   . Diverticulosis of colon   . Hyperlipidemia   . Gout   . Tubular adenoma of colon 07/2012  . GI bleed     secondary to Aspirin  . Polycythemia rubra vera   . Hemorrhoids   . Arthritis     "minor; shoulders primarily" (05/20/2013)  . CAD (coronary artery disease)     a. 05/2013 - Chest pain/unstable angina and dynamic EKG changes showing cath showing atherosclerotic  coronary artery disease manifested as diffuse ectasia, normal EF.  Alexander [name] Overweight(278.02)   . Pulmonary nodule     a. 05/2013 - seen on CXR.    Current Outpatient Prescriptions  Medication Sig Dispense Refill  . ACCU-CHEK SOFTCLIX LANCETS lancets Test 3 times a week 100 each 5  . allopurinol (ZYLOPRIM) 100 MG tablet Take 1 tablet by mouth  daily 90 tablet 1  . aspirin EC 81 MG tablet Take 1 tablet (81 mg total) by mouth daily. 90 tablet 3  . azelastine (ASTELIN) 0.1 % nasal spray Place 1 spray into both nostrils 2 (two) times daily. Use in each nostril as directed 90 mL 1  .  BENICAR 20 MG tablet Take 1 tablet by mouth  daily 90 tablet 1  . Coenzyme Q10 300 MG CAPS Take 1 capsule by mouth daily.     Alexander [name] glucose blood (ACCU-CHEK AVIVA) test strip Test 3 times a week 100 each 5  . hydrocortisone (ANUSOL-HC) 25 MG suppository Place 1 suppository (25 mg total) rectally 2 (two) times daily. 120 suppository 0  . nitroGLYCERIN (NITROSTAT) 0.4 MG SL tablet Place 1 tablet (0.4 mg total) under the tongue every 5 (five) minutes as needed for chest pain (up to 3 doses). 25 tablet 3  . pantoprazole (PROTONIX) 40 MG tablet Take 1 tablet by mouth  daily 90 tablet 1  . pravastatin (PRAVACHOL) 40 MG tablet Take 1 tablet (40 mg total) by mouth daily. 90 tablet 3  . PROCTOZONE-HC 2.5 % rectal cream Apply rectally two times  daily 60 g 3  . Tavaborole (KERYDIN) 5 % SOLN Apply 1 drop topically 1 day or 1 dose. Apply 1 drop to the toenail daily. 10 mL 11  . triamcinolone cream (KENALOG) 0.5 % Apply 1 application topically 2 (two) times daily. 15 g 0   No current facility-administered medications for this visit.    Allergies:   Metformin and related   Social History:  The patient  reports that he has never smoked. He has never used smokeless tobacco. He reports that he does not drink alcohol or use illicit drugs.   Family History:  The patient's family history includes Colon cancer in his mother; Diabetes in his father and mother; Heart disease (age of onset: 32) in his father; Stomach cancer in his maternal grandfather.   ROS:  Please see the history of present illness.     Easy bruising. All other systems reviewed and negative.   PHYSICAL EXAM: VS:  BP 130/76 mmHg  Pulse 53  Ht 5\' 11"  (1.803 m)  Wt 194 lb 12.8 oz (88.361 kg)  BMI 27.18 kg/m2  SpO2 99% Well nourished, well developed, in no acute distress HEENT: normal Neck: no JVD Cardiac:  normal S1, S2; RRR; no murmur Lungs:  clear to auscultation bilaterally, no wheezing, rhonchi or rales Abd: soft, nontender, no  hepatomegaly Ext: no edemaright wrist without hematoma or mass  Skin: warm and dry Neuro:  CNs 2-12 intact, no focal abnormalities noted  EKG:  Sinus brady, HR 48, NSSTTW changes     ASSESSMENT AND PLAN:  1. CAD (coronary artery disease): I'm fine with him taking low-dose aspirin. Diffuse ectasia noted on cardiac catheterization without significant obstructive disease. Continue medical therapy with aspirin,off beta blocker-bradycardia, felt poor-I'm fine with this, statin. He has had no further chest pain. Doing well. Continue with exercise. Swimming. 2. HYPERTENSION: Controlled. Doing well. 3. HYPERLIPIDEMIA: Continue with start of pravastatin once medicine comes in the mail. This  medication hopefully will be better tolerated. 4. POLYCYTHEMIA RUBRA VERA: Continue followup with hematology. Aspirin. Easy bruising. 5. Pulmonary nodule: Followup chest CT without lung nodule. No further workup planned. 6. Bradycardia: He is asymptomatic. Stay off beta blocker. Continue current therapy. 7. Chest pain-likely noncardiac, GI. He is currently on Protonix. Doing well. 8. Disposition:  One-year follow-up.   Signed, Alexander Furbish, MD  09/04/2014 10:24 AM    [facility] [address], [place], [place]  [address] Phone: [phone]; Fax: [phone]

## 2014-09-09 ENCOUNTER — Telehealth: Payer: Self-pay | Admitting: Hematology & Oncology

## 2014-09-09 NOTE — Telephone Encounter (Signed)
Patient called and cx 09/10/14 apt and resch for 09/30/14

## 2014-09-10 ENCOUNTER — Other Ambulatory Visit: Payer: Medicare Other

## 2014-09-10 ENCOUNTER — Ambulatory Visit: Payer: Medicare Other | Admitting: Hematology & Oncology

## 2014-09-15 LAB — HM DIABETES EYE EXAM

## 2014-09-16 ENCOUNTER — Ambulatory Visit: Payer: PRIVATE HEALTH INSURANCE | Admitting: Internal Medicine

## 2014-09-16 DIAGNOSIS — E119 Type 2 diabetes mellitus without complications: Secondary | ICD-10-CM | POA: Diagnosis not present

## 2014-09-18 ENCOUNTER — Telehealth: Payer: Self-pay | Admitting: *Deleted

## 2014-09-18 MED ORDER — TAVABOROLE 5 % EX SOLN: 1.0000 [drp] | CUTANEOUS | Status: DC

## 2014-09-18 NOTE — Telephone Encounter (Signed)
Pt presents to office for refill of Kerydin at [facility].  Dr. Jacqualyn Posey refilled +11.

## 2014-09-23 NOTE — Telephone Encounter (Signed)
Entered in error

## 2014-09-25 ENCOUNTER — Encounter: Payer: Self-pay | Admitting: Family Medicine

## 2014-09-25 ENCOUNTER — Ambulatory Visit (INDEPENDENT_AMBULATORY_CARE_PROVIDER_SITE_OTHER): Payer: Medicare Other | Admitting: Family Medicine

## 2014-09-25 VITALS — BP 122/80 | HR 49 | Temp 97.6°F | Ht 71.0 in | Wt 193.5 lb

## 2014-09-25 DIAGNOSIS — I10 Essential (primary) hypertension: Secondary | ICD-10-CM | POA: Diagnosis not present

## 2014-09-25 DIAGNOSIS — I2510 Atherosclerotic heart disease of native coronary artery without angina pectoris: Secondary | ICD-10-CM

## 2014-09-25 DIAGNOSIS — E785 Hyperlipidemia, unspecified: Secondary | ICD-10-CM | POA: Diagnosis not present

## 2014-09-25 DIAGNOSIS — K219 Gastro-esophageal reflux disease without esophagitis: Secondary | ICD-10-CM | POA: Diagnosis not present

## 2014-09-25 DIAGNOSIS — Z23 Encounter for immunization: Secondary | ICD-10-CM

## 2014-09-25 DIAGNOSIS — M1000 Idiopathic gout, unspecified site: Secondary | ICD-10-CM

## 2014-09-25 MED ORDER — HYDROCORTISONE 2.5 % RE CREA: TOPICAL_CREAM | Freq: Two times a day (BID) | RECTAL | Status: DC

## 2014-09-25 NOTE — Progress Notes (Signed)
Pre visit review using our clinic review tool, if applicable. No additional management support is needed unless otherwise documented below in the visit note. 

## 2014-09-25 NOTE — Progress Notes (Signed)
Subjective:    Patient ID: Alexander [name], male    DOB: Feb 11, 1940, 74 y.o.   MRN: [id number]  HPI Patient here for routine medical follow-up. He has history of gout and takes low-dose allopurinol but has not had any flareups in several years. He has hypertension treated with Benicar and stable. No recent dizziness or headaches. He has hyperlipidemia treated with pravastatin. No significant myalgias.  He has GERD which is controlled with pantoprazole. No recent dysphagia. He does not have any history of BPH symptoms. No significant nocturia. He had some recent atypical chest pains but none in several weeks now. Needs flu vaccine. Other immunizations up-to-date  Past Medical History  Diagnosis Date  . GERD (gastroesophageal reflux disease)   . Hypertension   . Microalbuminuria   . Lumbar back pain   . Hematuria, microscopic   . Hyperglycemia   . Diverticulosis of colon   . Hyperlipidemia   . Gout   . Tubular adenoma of colon 07/2012  . GI bleed     secondary to Aspirin  . Polycythemia rubra vera   . Hemorrhoids   . Arthritis     "minor; shoulders primarily" (05/20/2013)  . CAD (coronary artery disease)     a. 05/2013 - Chest pain/unstable angina and dynamic EKG changes showing cath showing atherosclerotic coronary artery disease manifested as diffuse ectasia, normal EF.  [name] Overweight(278.02)   . Pulmonary nodule     a. 05/2013 - seen on CXR.   Past Surgical History  Procedure Laterality Date  . Cataract extraction w/ intraocular lens  implant, bilateral Bilateral 2008  . Excisional hemorrhoidectomy  1970's  . Cardiac catheterization  05/20/2013  . Inguinal hernia repair Right ~ 2010  . Tonsillectomy and adenoidectomy  1940's  . Cystectomy  ~ 2000    " SEBACEOUS cyst removed from between shoulder blades"  . Left heart catheterization with coronary angiogram N/A 05/20/2013    Procedure: LEFT HEART CATHETERIZATION WITH CORONARY ANGIOGRAM;  Surgeon: Peter M Martinique, MD;  Location:  [facility] CATH LAB;  Service: Cardiovascular;  Laterality: N/A;    reports that he has never smoked. He has never used smokeless tobacco. He reports that he does not drink alcohol or use illicit drugs. family history includes Colon cancer in his mother; Diabetes in his father and mother; Heart disease (age of onset: 66) in his father; Stomach cancer in his maternal grandfather. Allergies  Allergen Reactions  . Metformin And Related Diarrhea      Review of Systems  Constitutional: Negative for fatigue.  Eyes: Negative for visual disturbance.  Respiratory: Negative for cough, chest tightness and shortness of breath.   Cardiovascular: Negative for chest pain, palpitations and leg swelling.  Endocrine: Negative for polydipsia and polyuria.  Neurological: Negative for dizziness, syncope, weakness, light-headedness and headaches.       Objective:   Physical Exam  Constitutional: He is oriented to person, place, and time. He appears well-developed and well-nourished.  HENT:  Right Ear: External ear normal.  Left Ear: External ear normal.  Mouth/Throat: Oropharynx is clear and moist.  Eyes: Pupils are equal, round, and reactive to light.  Neck: Neck supple. No thyromegaly present.  Cardiovascular: Normal rate and regular rhythm.   Pulmonary/Chest: Effort normal and breath sounds normal. No respiratory distress. He has no wheezes. He has no rales.  Musculoskeletal: He exhibits no edema.  Neurological: He is alert and oriented to person, place, and time.          Assessment &  Plan:  #1 hypertension. Stable and at goal. Continue current medications.  #2 hyperlipidemia. Recent lipids reviewed with patient. He has chronic low HDL but LDL fairly well-controlled #3 GERD stable on pantoprazole #4 history of gout. Stable on low-dose allopurinol  #5 health maintenance. Flu vaccine given

## 2014-09-29 DIAGNOSIS — B351 Tinea unguium: Secondary | ICD-10-CM | POA: Diagnosis not present

## 2014-09-29 DIAGNOSIS — D225 Melanocytic nevi of trunk: Secondary | ICD-10-CM | POA: Diagnosis not present

## 2014-09-29 DIAGNOSIS — L821 Other seborrheic keratosis: Secondary | ICD-10-CM | POA: Diagnosis not present

## 2014-09-30 ENCOUNTER — Other Ambulatory Visit (HOSPITAL_BASED_OUTPATIENT_CLINIC_OR_DEPARTMENT_OTHER): Payer: Medicare Other

## 2014-09-30 ENCOUNTER — Encounter: Payer: Self-pay | Admitting: Hematology & Oncology

## 2014-09-30 ENCOUNTER — Telehealth: Payer: Self-pay | Admitting: Hematology & Oncology

## 2014-09-30 ENCOUNTER — Ambulatory Visit (HOSPITAL_BASED_OUTPATIENT_CLINIC_OR_DEPARTMENT_OTHER): Payer: Medicare Other | Admitting: Hematology & Oncology

## 2014-09-30 VITALS — BP 134/78 | HR 48 | Temp 97.7°F | Resp 18 | Ht 71.0 in | Wt 194.0 lb

## 2014-09-30 DIAGNOSIS — D45 Polycythemia vera: Secondary | ICD-10-CM

## 2014-09-30 DIAGNOSIS — I2510 Atherosclerotic heart disease of native coronary artery without angina pectoris: Secondary | ICD-10-CM | POA: Diagnosis not present

## 2014-09-30 LAB — CMP (CANCER CENTER ONLY)
ALT(SGPT): 21 U/L (ref 10–47)
AST: 19 U/L (ref 11–38)
Albumin: 3.6 g/dL (ref 3.3–5.5)
Alkaline Phosphatase: 82 U/L (ref 26–84)
BUN: 18 mg/dL (ref 7–22)
CHLORIDE: 107 meq/L (ref 98–108)
CO2: 30 meq/L (ref 18–33)
CREATININE: 1.4 mg/dL — AB (ref 0.6–1.2)
Calcium: 9.3 mg/dL (ref 8.0–10.3)
GLUCOSE: 87 mg/dL (ref 73–118)
Potassium: 4.5 mEq/L (ref 3.3–4.7)
SODIUM: 141 meq/L (ref 128–145)
Total Bilirubin: 0.7 mg/dl (ref 0.20–1.60)
Total Protein: 7 g/dL (ref 6.4–8.1)

## 2014-09-30 LAB — CBC WITH DIFFERENTIAL (CANCER CENTER ONLY)
BASO#: 0 10*3/uL (ref 0.0–0.2)
BASO%: 0.4 % (ref 0.0–2.0)
EOS ABS: 0.3 10*3/uL (ref 0.0–0.5)
EOS%: 3.9 % (ref 0.0–7.0)
HCT: 42.6 % (ref 38.7–49.9)
HGB: 13.7 g/dL (ref 13.0–17.1)
LYMPH#: 2.1 10*3/uL (ref 0.9–3.3)
LYMPH%: 25.2 % (ref 14.0–48.0)
MCH: 27.4 pg — AB (ref 28.0–33.4)
MCHC: 32.2 g/dL (ref 32.0–35.9)
MCV: 85 fL (ref 82–98)
MONO#: 1.2 10*3/uL — ABNORMAL HIGH (ref 0.1–0.9)
MONO%: 14.9 % — AB (ref 0.0–13.0)
NEUT#: 4.6 10*3/uL (ref 1.5–6.5)
NEUT%: 55.6 % (ref 40.0–80.0)
PLATELETS: 218 10*3/uL (ref 145–400)
RBC: 5 10*6/uL (ref 4.20–5.70)
RDW: 16.1 % — ABNORMAL HIGH (ref 11.1–15.7)
WBC: 8.3 10*3/uL (ref 4.0–10.0)

## 2014-09-30 NOTE — Progress Notes (Signed)
This encounter was created in error - please disregard.

## 2014-09-30 NOTE — Progress Notes (Signed)
Hematology and Oncology Follow Up Visit  Alexander [name] [id number] 1940-11-29 74 y.o. 09/30/2014   Principle Diagnosis:  Polycythemia vera-JAK2 negative  Current Therapy:    Phlebotomy to maintain hematocrit below 45%  Aspirin 81 mg by mouth daily     Interim History:  [name] is in for a followup. He feels okay. Unfortunately, Alexander [name] is not doing too well. She may have a urinary tract infection. She is seeing her doctor this afternoon.  He's had no heart issues.  The major issue that has her now is that he has toenail fungus. We did go ahead and check some liver function tests on him. I did fax report over to Alexander dermatologist. I do not see any problems with him going on Lamisil.  He's had no cough. He's been swimming a lot this summer. He does [name] have a portal.  He's had no headache.  He's had no obvious change in bowel or bladder habits.  He's had no rashes. He's had no leg swelling.  Overall, Alexander performance status is ECOG 1.  Medications:  Current outpatient prescriptions:  .  ACCU-CHEK SOFTCLIX LANCETS lancets, Test 3 times a week, Disp: 100 each, Rfl: 5 .  allopurinol (ZYLOPRIM) 100 MG tablet, Take 1 tablet by mouth  daily, Disp: 90 tablet, Rfl: 1 .  aspirin EC 81 MG tablet, Take 1 tablet (81 mg total) by mouth daily., Disp: 90 tablet, Rfl: 3 .  azelastine (ASTELIN) 0.1 % nasal spray, Place 1 spray into both nostrils 2 (two) times daily. Use in each nostril as directed, Disp: 90 mL, Rfl: 1 .  BENICAR 20 MG tablet, Take 1 tablet by mouth  daily, Disp: 90 tablet, Rfl: 1 .  Coenzyme Q10 300 MG CAPS, Take 1 capsule by mouth daily. , Disp: , Rfl:  .  glucose blood (ACCU-CHEK AVIVA) test strip, Test 3 times a week, Disp: 100 each, Rfl: 5 .  hydrocortisone (ANUSOL-HC) 25 MG suppository, Place 1 suppository (25 mg total) rectally 2 (two) times daily., Disp: 120 suppository, Rfl: 0 .  hydrocortisone (PROCTOZONE-HC) 2.5 % rectal cream, Place rectally 2 (two) times  daily., Disp: 60 g, Rfl: 3 .  pantoprazole (PROTONIX) 40 MG tablet, Take 1 tablet by mouth  daily, Disp: 90 tablet, Rfl: 1 .  pravastatin (PRAVACHOL) 40 MG tablet, Take 1 tablet (40 mg total) by mouth daily., Disp: 90 tablet, Rfl: 3 .  Tavaborole (KERYDIN) 5 % SOLN, Apply 1 drop topically 1 day or 1 dose. Apply 1 drop to the toenail daily., Disp: 10 mL, Rfl: 11  Allergies:  Allergies  Allergen Reactions  . Metformin And Related Diarrhea    Past Medical History, Surgical history, Social history, and Family History were reviewed and updated.  Review of Systems: As above  Physical Exam:  height is 5\' 11"  (1.803 m) and weight is 194 lb (87.998 kg). Alexander oral temperature is 97.7 F (36.5 C). Alexander blood pressure is 134/78 and Alexander pulse is 48. Alexander respiration is 18.   Well-developed and well-nourished gentleman. Head and neck exam shows no ocular or oral lesions. He has no palpable cervical or supraclavicular lymph nodes. Alexander lungs are clear with no rales, wheezes or rhonchi. Cardiac exam regular rate and rhythm with no murmurs, rubs or bruits.. Abdomen is soft. He has good bowel sounds. There is no fluid wave. There is no palpable liver or spleen tip. Back exam shows no tenderness over the spine, ribs or hips. Extremities shows no clubbing  cyanosis or edema. He has good range of motion of Alexander joints. Has good strength in Alexander extremities. Skin exam no rashes, ecchymoses or petechia. Neurological exam is nonfocal.  Lab Results  Component Value Date   WBC 8.3 09/30/2014   HGB 13.7 09/30/2014   HCT 42.6 09/30/2014   MCV 85 09/30/2014   PLT 218 09/30/2014     Chemistry      Component Value Date/Time   NA 141 09/30/2014 1341   NA 138 04/15/2014 0833   K 4.5 09/30/2014 1341   K 4.5 04/15/2014 0833   CL 107 09/30/2014 1341   CL 106 04/15/2014 0833   CO2 30 09/30/2014 1341   CO2 28 04/15/2014 0833   BUN 18 09/30/2014 1341   BUN 18 04/15/2014 0833   CREATININE 1.4* 09/30/2014 1341    CREATININE 1.20 04/15/2014 0833      Component Value Date/Time   CALCIUM 9.3 09/30/2014 1341   CALCIUM 9.1 04/15/2014 0833   ALKPHOS 82 09/30/2014 1341   ALKPHOS 78 08/17/2014 0835   AST 19 09/30/2014 1341   AST 13 08/17/2014 0835   ALT 21 09/30/2014 1341   ALT 11 08/17/2014 0835   BILITOT 0.70 09/30/2014 1341   BILITOT 0.4 08/17/2014 0835         Impression and Plan: Alexander [name] is 74 year old gentleman. He has polycythemia. We have done a good job in [place] him and keeping Alexander blood counts adequate.   He does not need any phlebotomies today. I'm just glad that Alexander blood count is down. For now, I will plan to have come back in another 3-4 months.   [name], MD 9/28/20162:25 PM

## 2014-09-30 NOTE — Telephone Encounter (Signed)
Called patient's home #. Talked to patient and his wife. Advised them of patient's appt for January 5th, 2017. Patient and his wife wanted to move the appt to a time later in the day. Rescheduled appt for 01/05/2014 at 2:45pm.       [facility].

## 2014-10-12 ENCOUNTER — Other Ambulatory Visit: Payer: Self-pay | Admitting: Internal Medicine

## 2014-10-14 ENCOUNTER — Other Ambulatory Visit: Payer: Self-pay | Admitting: Internal Medicine

## 2014-11-02 DIAGNOSIS — B351 Tinea unguium: Secondary | ICD-10-CM | POA: Diagnosis not present

## 2014-11-02 DIAGNOSIS — R079 Chest pain, unspecified: Secondary | ICD-10-CM | POA: Diagnosis not present

## 2014-11-03 ENCOUNTER — Telehealth: Payer: Self-pay | Admitting: Internal Medicine

## 2014-11-03 ENCOUNTER — Encounter: Payer: Self-pay | Admitting: Family Medicine

## 2014-11-03 ENCOUNTER — Ambulatory Visit (INDEPENDENT_AMBULATORY_CARE_PROVIDER_SITE_OTHER): Payer: Medicare Other | Admitting: Family Medicine

## 2014-11-03 VITALS — BP 132/86 | HR 84 | Temp 98.3°F | Wt 189.0 lb

## 2014-11-03 DIAGNOSIS — R0789 Other chest pain: Secondary | ICD-10-CM | POA: Diagnosis not present

## 2014-11-03 DIAGNOSIS — K219 Gastro-esophageal reflux disease without esophagitis: Secondary | ICD-10-CM

## 2014-11-03 DIAGNOSIS — I10 Essential (primary) hypertension: Secondary | ICD-10-CM | POA: Diagnosis not present

## 2014-11-03 NOTE — Telephone Encounter (Signed)
Patient Name: Alexander [name]  DOB: May 17, 1940    Initial Comment Caller states he had a very high BP last night he had called EMT. Pain in chest and top of arm. He has some reflux issues. EMT told him he needs to make an appointment for a follow up.    Nurse Assessment  Nurse: Julien Girt, RN, Almyra Free Date/Time Eilene Ghazi Time): 11/03/2014 12:21:49 PM  Confirm and document reason for call. If symptomatic, describe symptoms. ---Caller states he had a cocktail last night and ate a big dinner, when he laid down to rest he had chest pain and pain in the top of left arm. After he called 911 and sat up in bed the pain went away, EMS did come , B/P was 190/90, EKG was normal and the gave him the option of ED vs Home Care. States he believes it was his reflux, he has had an extensive cardiac workup in the past and is on Protonix. This morning he is up, has been walking and "feels fine". He is calling to make an FU appointment as recommended by EMS.  Has the patient traveled out of the country within the last 30 days? ---Not Applicable  Does the patient have any new or worsening symptoms? ---Yes  Will a triage be completed? ---Yes  Related visit to physician within the last 2 weeks? ---No  Does the PT have any chronic conditions? (i.e. diabetes, asthma, etc.) ---Yes  List chronic conditions. ---GERD, Htn, polycythemia     Guidelines    Guideline Title Affirmed Question Affirmed Notes       Final Disposition User        Comments  Mr. Panther declined triage at this time, requesting FU appointment today because he has not had an "episode like this in years". Instructions/appointment provided, he is aware we have 24 hr nurse triage now and will cb as needed.

## 2014-11-03 NOTE — Telephone Encounter (Signed)
Patient scheduled with Dr. Yong Channel today at 4:30

## 2014-11-03 NOTE — Progress Notes (Signed)
Alexander Reddish, MD  Subjective:  Alexander [name] is a 73 y.o. year old very pleasant male patient who presents for/with See problem oriented charting ROS- .No  shortness of breath or dyspnea on exertion. No headache or blurry vision.  Also see HPI  Past Medical History- hyperlipidemia, hypertension, CAD listed but appers nonobstructive by last cath report in Dr. Marlou Porch last office note   Medications- reviewed and updated Current Outpatient Prescriptions  Medication Sig Dispense Refill  . ACCU-CHEK SOFTCLIX LANCETS lancets Test 3 times a week 100 each 5  . allopurinol (ZYLOPRIM) 100 MG tablet Take 1 tablet by mouth  daily 90 tablet 0  . aspirin EC 81 MG tablet Take 1 tablet (81 mg total) by mouth daily. 90 tablet 3  . azelastine (ASTELIN) 0.1 % nasal spray Place 1 spray into both nostrils 2 (two) times daily. Use in each nostril as directed 90 mL 1  . BENICAR 20 MG tablet Take 1 tablet by mouth  daily 90 tablet 0  . Coenzyme Q10 300 MG CAPS Take 1 capsule by mouth daily.     [name] glucose blood (ACCU-CHEK AVIVA) test strip Test 3 times a week 100 each 5  . pantoprazole (PROTONIX) 40 MG tablet Take 1 tablet by mouth  daily 90 tablet 1  . pravastatin (PRAVACHOL) 40 MG tablet Take 1 tablet (40 mg total) by mouth daily. 90 tablet 3  . Tavaborole (KERYDIN) 5 % SOLN Apply 1 drop topically 1 day or 1 dose. Apply 1 drop to the toenail daily. 10 mL 11  . hydrocortisone (ANUSOL-HC) 25 MG suppository Place 1 suppository (25 mg total) rectally 2 (two) times daily. (Patient not taking: Reported on 11/03/2014) 120 suppository 0  . hydrocortisone (PROCTOZONE-HC) 2.5 % rectal cream Place rectally 2 (two) times daily. (Patient not taking: Reported on 11/03/2014) 60 g 3   No current facility-administered medications for this visit.    Objective: BP 132/86 mmHg  Pulse 84  Temp(Src) 98.3 F (36.8 C)  Wt 189 lb (85.73 kg) Gen: NAD, resting comfortably CV: RRR no murmurs rubs or gallops Lungs: CTAB no  crackles, wheeze, rhonchi Abdomen: soft/nontender/nondistended/normal bowel sounds. No rebound or guarding.  Ext: no edema Skin: warm, dry Neuro: grossly normal, moves all extremities  Assessment/Plan:  Atypical Chest pain Hypertension GERD S: last night, patient had a Cocktail (jack daniels fire) and big dinner (prime rib fatty and salty) after 7pm- laid down around 10pm and noted some 3/10 central and upper chest pain when he reclined.  He sat up and issue resolved. He later laid back down and noted symptoms again. Was not exerting himself at all before this. Had recurrence of 3/10 aching pain that started to move towards left shoulder. No nausea, diaphoresis, left arm or neck pain. He had taken his protonix that AM. Called EMS when pain recurred but in process of calling pain was already improving. When EMS arrived pain had essentially resolved. BP was 190/90. EKG was normal (patient forgot to bring copy). As patients fears calmed, BP was 130/80 when EMS left. They did not take him to the hospital but gave patient option. Whole episode of pain at longest was 5 or 6 minutes but may have been shorter. When laid back down later- symptoms did not recur. Patient woke up feeling great and normal- denies any exertional chest pain or shortness of breath  Of note, patient had stress myoview low risk in 12/2012 and also heart catheterization in 2015 which showed "diffuse ectasia without  significant obstruction" in Dr. Marlou Porch last note- saw him within 2 months.  Patient is compliant with aspirin and statin and last LDL was 77 in august. No diabetes but at increased risk with a1c 6.2  A/P: Atypical chest pain (aching, nonexertional, started with lying down, relieved with sitting up- not with rest) in patient who had a heavy meal and Shearon Stalls fire within a few hours of event. GERD is strongest likelihood. We discussed could check EKG, Troponin, stress test but that I thought this would be low yield unless  patient has recurrence despite avoiding spicy beverages and eating larger meals (usually eats small meals). He is to toncintue protonix for GERD which I think is most likely source of pain. We discussed reassurance provided by cath in 2015 and that neither EKG, stress test, or troponin could fully rule out cardiac disease even as much as that cath within a year did. I will alert PCP and cardiologist to this visit. Finally, BP is controlled today- suspect this was anxiety related as reason for elevation.   Emergent/strict Return precautions advised.

## 2014-11-03 NOTE — Patient Instructions (Signed)
I think there is a high likelihood that your episode represents reflux given the beverage you drank and the large meal.   We discussed cardiac workup but truly think that is low yield given your presentation. If this occurs again while you avoid such beverage and large meals then I do believe we should embark on cardiac workup.   i will send Dr. Shawna Orleans and Dr. Marlou Porch a message and just let them know what has happened. If they feel differently, I will let you know.   If recurrence with these measures- seek care immediately. Continue pantoprazole

## 2014-11-12 ENCOUNTER — Ambulatory Visit: Payer: Medicare Other | Admitting: Podiatry

## 2014-11-17 ENCOUNTER — Ambulatory Visit: Payer: Medicare Other | Admitting: Podiatry

## 2014-12-01 DIAGNOSIS — Z79899 Other long term (current) drug therapy: Secondary | ICD-10-CM | POA: Diagnosis not present

## 2014-12-02 ENCOUNTER — Encounter: Payer: Self-pay | Admitting: Internal Medicine

## 2014-12-06 ENCOUNTER — Encounter: Payer: Self-pay | Admitting: Internal Medicine

## 2014-12-08 ENCOUNTER — Other Ambulatory Visit: Payer: Self-pay | Admitting: Internal Medicine

## 2014-12-08 MED ORDER — GLUCOSE BLOOD VI STRP: ORAL_STRIP | Status: DC

## 2014-12-08 MED ORDER — ACCU-CHEK SOFTCLIX LANCETS MISC: Status: DC

## 2014-12-08 NOTE — Telephone Encounter (Signed)
Pharmacy: [facility] [address]

## 2014-12-08 NOTE — Telephone Encounter (Signed)
Patient is requesting an RX refill on test strips for the glucose meter.

## 2014-12-08 NOTE — Telephone Encounter (Signed)
Rx was sent to the pharmacy  

## 2014-12-15 ENCOUNTER — Ambulatory Visit (INDEPENDENT_AMBULATORY_CARE_PROVIDER_SITE_OTHER): Payer: Medicare Other | Admitting: Podiatry

## 2014-12-15 ENCOUNTER — Encounter: Payer: Self-pay | Admitting: Podiatry

## 2014-12-15 DIAGNOSIS — B351 Tinea unguium: Secondary | ICD-10-CM

## 2014-12-15 DIAGNOSIS — M79676 Pain in unspecified toe(s): Secondary | ICD-10-CM

## 2014-12-15 NOTE — Progress Notes (Signed)
He presents today with a chief complaint of painful elongated toenails. His dermatologist recently placed him on Lamisil.   Objective: Vital signs are stable he is alert and oriented 3. Pulses are strongly palpable. Nails are thick yellow dystrophic with mycotic and painful palpation.   assessment: Pain in limb secondary to onychomycosis 1 through 5 bilateral.   Plan: debridement of toenails 1 through 5 bilateral covered service secondary to pain.

## 2015-01-04 ENCOUNTER — Encounter: Payer: Self-pay | Admitting: Hematology & Oncology

## 2015-01-06 ENCOUNTER — Ambulatory Visit: Payer: Medicare Other | Admitting: Hematology & Oncology

## 2015-01-06 ENCOUNTER — Other Ambulatory Visit: Payer: Medicare Other

## 2015-01-07 ENCOUNTER — Other Ambulatory Visit: Payer: Medicare Other

## 2015-01-07 ENCOUNTER — Ambulatory Visit: Payer: Medicare Other | Admitting: Hematology & Oncology

## 2015-01-11 ENCOUNTER — Other Ambulatory Visit (HOSPITAL_BASED_OUTPATIENT_CLINIC_OR_DEPARTMENT_OTHER): Payer: Medicare HMO

## 2015-01-11 ENCOUNTER — Encounter: Payer: Self-pay | Admitting: Hematology & Oncology

## 2015-01-11 ENCOUNTER — Ambulatory Visit (HOSPITAL_BASED_OUTPATIENT_CLINIC_OR_DEPARTMENT_OTHER): Payer: Medicare HMO

## 2015-01-11 ENCOUNTER — Telehealth: Payer: Self-pay | Admitting: Internal Medicine

## 2015-01-11 ENCOUNTER — Other Ambulatory Visit: Payer: Self-pay | Admitting: Internal Medicine

## 2015-01-11 ENCOUNTER — Ambulatory Visit (HOSPITAL_BASED_OUTPATIENT_CLINIC_OR_DEPARTMENT_OTHER): Payer: Medicare HMO | Admitting: Hematology & Oncology

## 2015-01-11 VITALS — BP 149/91 | HR 50 | Resp 20

## 2015-01-11 VITALS — BP 150/68 | HR 50 | Temp 97.4°F | Resp 18 | Ht 71.0 in | Wt 192.0 lb

## 2015-01-11 DIAGNOSIS — Z0000 Encounter for general adult medical examination without abnormal findings: Secondary | ICD-10-CM

## 2015-01-11 DIAGNOSIS — R739 Hyperglycemia, unspecified: Secondary | ICD-10-CM

## 2015-01-11 DIAGNOSIS — D45 Polycythemia vera: Secondary | ICD-10-CM

## 2015-01-11 DIAGNOSIS — Z8639 Personal history of other endocrine, nutritional and metabolic disease: Secondary | ICD-10-CM

## 2015-01-11 LAB — CBC WITH DIFFERENTIAL (CANCER CENTER ONLY)
BASO#: 0.1 10*3/uL (ref 0.0–0.2)
BASO%: 0.6 % (ref 0.0–2.0)
EOS ABS: 0.3 10*3/uL (ref 0.0–0.5)
EOS%: 3.9 % (ref 0.0–7.0)
HEMATOCRIT: 47 % (ref 38.7–49.9)
HEMOGLOBIN: 15.2 g/dL (ref 13.0–17.1)
LYMPH#: 2.2 10*3/uL (ref 0.9–3.3)
LYMPH%: 26 % (ref 14.0–48.0)
MCH: 28.2 pg (ref 28.0–33.4)
MCHC: 32.3 g/dL (ref 32.0–35.9)
MCV: 87 fL (ref 82–98)
MONO#: 1.1 10*3/uL — AB (ref 0.1–0.9)
MONO%: 13.2 % — ABNORMAL HIGH (ref 0.0–13.0)
NEUT%: 56.3 % (ref 40.0–80.0)
NEUTROS ABS: 4.8 10*3/uL (ref 1.5–6.5)
Platelets: 226 10*3/uL (ref 145–400)
RBC: 5.39 10*6/uL (ref 4.20–5.70)
RDW: 15.8 % — ABNORMAL HIGH (ref 11.1–15.7)
WBC: 8.5 10*3/uL (ref 4.0–10.0)

## 2015-01-11 LAB — CMP (CANCER CENTER ONLY)
ALK PHOS: 78 U/L (ref 26–84)
ALT: 19 U/L (ref 10–47)
AST: 23 U/L (ref 11–38)
Albumin: 3.7 g/dL (ref 3.3–5.5)
BUN: 20 mg/dL (ref 7–22)
CO2: 29 mEq/L (ref 18–33)
CREATININE: 1.2 mg/dL (ref 0.6–1.2)
Calcium: 9.2 mg/dL (ref 8.0–10.3)
Chloride: 101 mEq/L (ref 98–108)
GLUCOSE: 109 mg/dL (ref 73–118)
Potassium: 4.6 mEq/L (ref 3.3–4.7)
SODIUM: 144 meq/L (ref 128–145)
TOTAL PROTEIN: 7.8 g/dL (ref 6.4–8.1)
Total Bilirubin: 0.7 mg/dl (ref 0.20–1.60)

## 2015-01-11 NOTE — Progress Notes (Signed)
[name] presents today for phlebotomy per MD orders. Phlebotomy procedure started at 1437 and ended at 1447. Approximately 500 mls removed. Patient observed for 30 minutes after procedure without any incident. Patient tolerated procedure well. IV needle removed intact.

## 2015-01-11 NOTE — Telephone Encounter (Signed)
Labs ordered and appointment made. ?

## 2015-01-11 NOTE — Progress Notes (Signed)
Hematology and Oncology Follow Up Visit  Alexander [name] [id number] Jan 16, 1940 75 y.o. 01/11/2015   Principle Diagnosis:  Polycythemia vera-JAK2 negative  Current Therapy:    Phlebotomy to maintain hematocrit below 45%  Aspirin 81 mg by mouth daily     Interim History:  [name] is in for a followup. He feels okay. Unfortunately, his wife is not doing too well. She had knee surgery couple weeks ago. She's having a tough time recovering from this and trying to be active. Given her age, this is not a surprise.  He enjoyed the holidays. He has family coming from out of town with was quite a joy for he and his wife. They got to see some of their grandchildren.   He's had no headache.  He's had no cardiac issues. He's had no chest pain. He has had some reflux which has causal bit of irritation.  He's had no obvious change in bowel or bladder habits.  He's had no rashes. He's had no leg swelling.  Overall, his performance status is ECOG 1.  Medications:  Current outpatient prescriptions:  .  ACCU-CHEK SOFTCLIX LANCETS lancets, Test 3 times a week, Disp: 100 each, Rfl: 5 .  allopurinol (ZYLOPRIM) 100 MG tablet, Take 1 tablet by mouth  daily, Disp: 90 tablet, Rfl: 0 .  aspirin EC 81 MG tablet, Take 1 tablet (81 mg total) by mouth daily., Disp: 90 tablet, Rfl: 3 .  azelastine (ASTELIN) 0.1 % nasal spray, Place 1 spray into both nostrils 2 (two) times daily. Use in each nostril as directed, Disp: 90 mL, Rfl: 1 .  BENICAR 20 MG tablet, Take 1 tablet by mouth  daily, Disp: 90 tablet, Rfl: 0 .  Coenzyme Q10 300 MG CAPS, Take 1 capsule by mouth daily. , Disp: , Rfl:  .  glucose blood (ACCU-CHEK AVIVA) test strip, Test 3 times a week, Disp: 100 each, Rfl: 5 .  hydrocortisone (ANUSOL-HC) 25 MG suppository, Place 1 suppository (25 mg total) rectally 2 (two) times daily., Disp: 120 suppository, Rfl: 0 .  hydrocortisone (PROCTOZONE-HC) 2.5 % rectal cream, Place rectally 2 (two) times  daily., Disp: 60 g, Rfl: 3 .  pantoprazole (PROTONIX) 40 MG tablet, Take 1 tablet by mouth  daily, Disp: 90 tablet, Rfl: 1 .  pravastatin (PRAVACHOL) 40 MG tablet, Take 1 tablet (40 mg total) by mouth daily., Disp: 90 tablet, Rfl: 3 .  Tavaborole (KERYDIN) 5 % SOLN, Apply 1 drop topically 1 day or 1 dose. Apply 1 drop to the toenail daily., Disp: 10 mL, Rfl: 11  Allergies:  Allergies  Allergen Reactions  . Metformin And Related Diarrhea    Past Medical History, Surgical history, Social history, and Family History were reviewed and updated.  Review of Systems: As above  Physical Exam:  height is 5\' 11"  (1.803 m) and weight is 192 lb (87.091 kg). His oral temperature is 97.4 F (36.3 C). His blood pressure is 150/68 and his pulse is 50. His respiration is 18.   Well-developed and well-nourished gentleman. Head and neck exam shows no ocular or oral lesions. He has no palpable cervical or supraclavicular lymph nodes. His lungs are clear with no rales, wheezes or rhonchi. Cardiac exam regular rate and rhythm with no murmurs, rubs or bruits.. Abdomen is soft. He has good bowel sounds. There is no fluid wave. There is no palpable liver or spleen tip. Back exam shows no tenderness over the spine, ribs or hips. Extremities shows no clubbing cyanosis  or edema. He has good range of motion of his joints. Has good strength in his extremities. Skin exam no rashes, ecchymoses or petechia. Neurological exam is nonfocal.  Lab Results  Component Value Date   WBC 8.5 01/11/2015   HGB 15.2 01/11/2015   HCT 47.0 01/11/2015   MCV 87 01/11/2015   PLT 226 01/11/2015     Chemistry      Component Value Date/Time   NA 144 01/11/2015 1339   NA 138 04/15/2014 0833   K 4.6 01/11/2015 1339   K 4.5 04/15/2014 0833   CL 101 01/11/2015 1339   CL 106 04/15/2014 0833   CO2 29 01/11/2015 1339   CO2 28 04/15/2014 0833   BUN 20 01/11/2015 1339   BUN 18 04/15/2014 0833   CREATININE 1.2 01/11/2015 1339    CREATININE 1.20 04/15/2014 0833      Component Value Date/Time   CALCIUM 9.2 01/11/2015 1339   CALCIUM 9.1 04/15/2014 0833   ALKPHOS 78 01/11/2015 1339   ALKPHOS 78 08/17/2014 0835   AST 23 01/11/2015 1339   AST 13 08/17/2014 0835   ALT 19 01/11/2015 1339   ALT 11 08/17/2014 0835   BILITOT 0.70 01/11/2015 1339   BILITOT 0.4 08/17/2014 0835         Impression and Plan: Alexander [name] is 75 year old gentleman. He has polycythemia. We have done a good job in [place] him and keeping his blood counts adequate. We will go ahead and phlebotomize him today.  I'll plan to get him back in about 3 months.  I hope that his wife does better with respect to her knee surgery. Sounds like she's having quite a bit of difficulty so far.  [name], MD 1/9/20174:16 PM

## 2015-01-11 NOTE — Telephone Encounter (Signed)
Pt said they need to have labs drawn before they see you next week.

## 2015-01-11 NOTE — Patient Instructions (Signed)
Therapeutic Phlebotomy, Care After  Refer to this sheet in the next few weeks. These instructions provide you with information about caring for yourself after your procedure. Your health care provider may also give you more specific instructions. Your treatment has been planned according to current medical practices, but problems sometimes occur. Call your health care provider if you have any problems or questions after your procedure.  WHAT TO EXPECT AFTER THE PROCEDURE  After your procedure, it is common to have:   Light-headedness or dizziness. You may feel faint.   Nausea.   Tiredness.  HOME CARE INSTRUCTIONS  Activities   Return to your normal activities as directed by your health care provider. Most people can go back to their normal activities right away.   Avoid strenuous physical activity and heavy lifting or pulling for about 5 hours after the procedure. Do not lift anything that is heavier than 10 lb (4.5 kg).   Athletes should avoid strenuous exercise for at least 12 hours.   Change positions slowly for the remainder of the day. This will help to prevent light-headedness or fainting.   If you feel light-headed, lie down until the feeling goes away.  Eating and Drinking   Be sure to eat well-balanced meals for the next 24 hours.   Drink enough fluid to keep your urine clear or pale yellow.   Avoid drinking alcohol on the day that you had the procedure.  Care of the Needle Insertion Site   Keep your bandage dry. You can remove the bandage after about 5 hours or as directed by your health care provider.   If you have bleeding from the needle insertion site, elevate your arm and press firmly on the site until the bleeding stops.   If you have bruising at the site, apply ice to the area:   Put ice in a plastic bag.   Place a towel between your skin and the bag.   Leave the ice on for 20 minutes, 2-3 times a day for the first 24 hours.   If the swelling does not go away after 24 hours, apply  a warm, moist washcloth to the area for 20 minutes, 2-3 times a day.  General Instructions   Avoid smoking for at least 30 minutes after the procedure.   Keep all follow-up visits as directed by your health care provider. It is important to continue with further therapeutic phlebotomy treatments as directed.  SEEK MEDICAL CARE IF:   You have redness, swelling, or pain at the needle insertion site.   You have fluid, blood, or pus coming from the needle insertion site.   You feel light-headed, dizzy, or nauseated, and the feeling does not go away.   You notice new bruising at the needle insertion site.   You feel weaker than normal.   You have a fever or chills.  SEEK IMMEDIATE MEDICAL CARE IF:   You have severe nausea or vomiting.   You have chest pain.   You have trouble breathing.    This information is not intended to replace advice given to you by your health care provider. Make sure you discuss any questions you have with your health care provider.    Document Released: 05/23/2010 Document Revised: 05/05/2014 Document Reviewed: 12/15/2013  Elsevier Interactive Patient Education 2016 [employer]

## 2015-01-12 ENCOUNTER — Other Ambulatory Visit (INDEPENDENT_AMBULATORY_CARE_PROVIDER_SITE_OTHER): Payer: Medicare HMO

## 2015-01-12 DIAGNOSIS — Z0000 Encounter for general adult medical examination without abnormal findings: Secondary | ICD-10-CM | POA: Diagnosis not present

## 2015-01-12 DIAGNOSIS — Z8639 Personal history of other endocrine, nutritional and metabolic disease: Secondary | ICD-10-CM | POA: Diagnosis not present

## 2015-01-12 LAB — RETICULOCYTES: Reticulocyte Count: 0.7 % (ref 0.6–2.6)

## 2015-01-12 LAB — IRON AND TIBC
%SAT: 8 % — AB (ref 20–55)
Iron: 28 ug/dL — ABNORMAL LOW (ref 42–163)
TIBC: 345 ug/dL (ref 202–409)
UIBC: 317 ug/dL (ref 117–376)

## 2015-01-12 LAB — BASIC METABOLIC PANEL
BUN: 20 mg/dL (ref 6–23)
CALCIUM: 9.6 mg/dL (ref 8.4–10.5)
CO2: 31 mEq/L (ref 19–32)
Chloride: 103 mEq/L (ref 96–112)
Creatinine, Ser: 1.19 mg/dL (ref 0.40–1.50)
GFR: 63.36 mL/min (ref >60.00)
GLUCOSE: 116 mg/dL — AB (ref 70–99)
POTASSIUM: 4.3 meq/L (ref 3.5–5.1)
Sodium: 140 mEq/L (ref 135–145)

## 2015-01-12 LAB — FERRITIN: Ferritin: 18 ng/ml — ABNORMAL LOW (ref 22–316)

## 2015-01-12 LAB — HEMOGLOBIN A1C: Hgb A1c MFr Bld: 6.3 % (ref 4.6–6.5)

## 2015-01-12 LAB — LACTATE DEHYDROGENASE: LDH: 162 U/L (ref 125–245)

## 2015-01-13 LAB — HEPATITIS C ANTIBODY: HCV Ab: NEGATIVE

## 2015-01-15 ENCOUNTER — Ambulatory Visit (INDEPENDENT_AMBULATORY_CARE_PROVIDER_SITE_OTHER): Payer: Medicare HMO | Admitting: Internal Medicine

## 2015-01-15 ENCOUNTER — Encounter: Payer: Self-pay | Admitting: Internal Medicine

## 2015-01-15 VITALS — BP 110/80 | HR 61 | Temp 98.3°F | Wt 188.0 lb

## 2015-01-15 DIAGNOSIS — I10 Essential (primary) hypertension: Secondary | ICD-10-CM | POA: Diagnosis not present

## 2015-01-15 DIAGNOSIS — B351 Tinea unguium: Secondary | ICD-10-CM

## 2015-01-15 DIAGNOSIS — E785 Hyperlipidemia, unspecified: Secondary | ICD-10-CM | POA: Diagnosis not present

## 2015-01-15 DIAGNOSIS — R7303 Prediabetes: Secondary | ICD-10-CM | POA: Diagnosis not present

## 2015-01-15 NOTE — Patient Instructions (Signed)
Please complete the following lab tests before your next follow up appointment: BMET, A1c - 790.29 FLP, LFTs - 272.4 

## 2015-01-15 NOTE — Progress Notes (Signed)
Subjective:    Patient ID: Alexander [name], male    DOB: 1940/05/01, 75 y.o.   MRN: [id number]  HPI  75 year old white male with history of hypertension, prediabetes and nonobstructive coronary artery disease for routine follow-up.  Patient seen by Dr. Yong Channel in November 2016 secondary to atypical chest pain. Symptoms triggered by heavy meal and unusual alcohol intake.  His symptoms completely resolved.  No further recurrence.  Prediabetes - weight is stable.  He stays active.  A1c stable.  Hyperlipidemia - Less musculoskeletal complaints since switching from simvastatin to pravastatin.  Polycythemia vera- he had recent phlebotomy with Dr. Marin Olp  Patient reports seeing podiatrist for onychomycosis of toenails. He is been on Lamisil for last 2 months. Podiatrist is monitoring LFTs.  Lab Results  Component Value Date   WBC 8.5 01/11/2015   HGB 15.2 01/11/2015   HCT 47.0 01/11/2015   MCV 87 01/11/2015   PLT 226 01/11/2015   Lab Results  Component Value Date   HGBA1C 6.3 01/12/2015     Review of Systems Negative for chest pain    Past Medical History  Diagnosis Date  . GERD (gastroesophageal reflux disease)   . Hypertension   . Microalbuminuria   . Lumbar back pain   . Hematuria, microscopic   . Hyperglycemia   . Diverticulosis of colon   . Hyperlipidemia   . Gout   . Tubular adenoma of colon 07/2012  . GI bleed     secondary to Aspirin  . Polycythemia rubra vera ([place])   . Hemorrhoids   . Arthritis     "minor; shoulders primarily" (05/20/2013)  . CAD (coronary artery disease)     a. 05/2013 - Chest pain/unstable angina and dynamic EKG changes showing cath showing atherosclerotic coronary artery disease manifested as diffuse ectasia, normal EF.  [name] Overweight(278.02)   . Pulmonary nodule     a. 05/2013 - seen on CXR.    Social History   Social History  . Marital Status: Married    Spouse Name: N/A  . Number of Children: 3  . Years of Education: N/A    Occupational History  . retired Chief Financial Officer    Social History Main Topics  . Smoking status: Never Smoker   . Smokeless tobacco: Never Used     Comment: NEVER USED TOBACCO  . Alcohol Use: No  . Drug Use: No  . Sexual Activity:    Partners: Female   Other Topics Concern  . Not on file   Social History Narrative   Daughter, [name] (pediatrician--lives in [place], [place])    Past Surgical History  Procedure Laterality Date  . Cataract extraction w/ intraocular lens  implant, bilateral Bilateral 2008  . Excisional hemorrhoidectomy  1970's  . Cardiac catheterization  05/20/2013  . Inguinal hernia repair Right ~ 2010  . Tonsillectomy and adenoidectomy  1940's  . Cystectomy  ~ 2000    " SEBACEOUS cyst removed from between shoulder blades"  . Left heart catheterization with coronary angiogram N/A 05/20/2013    Procedure: LEFT HEART CATHETERIZATION WITH CORONARY ANGIOGRAM;  Surgeon: Peter M Martinique, MD;  Location: [facility] CATH LAB;  Service: Cardiovascular;  Laterality: N/A;    Family History  Problem Relation Age of Onset  . Colon cancer Mother     died at 93, colorectal  . Heart disease Father 43    MI  . Diabetes Father   . Diabetes Mother   . Stomach cancer Maternal Grandfather     Allergies  Allergen Reactions  . Metformin And Related Diarrhea    Current Outpatient Prescriptions on File Prior to Visit  Medication Sig Dispense Refill  . ACCU-CHEK SOFTCLIX LANCETS lancets Test 3 times a week 100 each 5  . allopurinol (ZYLOPRIM) 100 MG tablet Take 1 tablet by mouth  daily 90 tablet 0  . aspirin EC 81 MG tablet Take 1 tablet (81 mg total) by mouth daily. 90 tablet 3  . azelastine (ASTELIN) 0.1 % nasal spray Place 1 spray into both nostrils 2 (two) times daily. Use in each nostril as directed 90 mL 1  . BENICAR 20 MG tablet Take 1 tablet by mouth  daily 90 tablet 0  . Coenzyme Q10 300 MG CAPS Take 1 capsule by mouth daily.     [name] glucose blood (ACCU-CHEK AVIVA) test strip Test 3  times a week 100 each 5  . hydrocortisone (ANUSOL-HC) 25 MG suppository Place 1 suppository (25 mg total) rectally 2 (two) times daily. 120 suppository 0  . hydrocortisone (PROCTOZONE-HC) 2.5 % rectal cream Place rectally 2 (two) times daily. 60 g 3  . pantoprazole (PROTONIX) 40 MG tablet Take 1 tablet by mouth  daily 90 tablet 1  . pravastatin (PRAVACHOL) 40 MG tablet Take 1 tablet (40 mg total) by mouth daily. 90 tablet 3  . Tavaborole (KERYDIN) 5 % SOLN Apply 1 drop topically 1 day or 1 dose. Apply 1 drop to the toenail daily. 10 mL 11   No current facility-administered medications on file prior to visit.    BP 110/80 mmHg  Pulse 61  Temp(Src) 98.3 F (36.8 C) (Oral)  Wt 188 lb (85.276 kg)    Objective:   Physical Exam  Constitutional: He is oriented to person, place, and time. He appears well-developed and well-nourished. No distress.  HENT:  Head: Normocephalic and atraumatic.  Cardiovascular: Normal rate, regular rhythm and normal heart sounds.   Pulmonary/Chest: Effort normal and breath sounds normal. He has no wheezes.  Neurological: He is alert and oriented to person, place, and time. No cranial nerve deficit.  Psychiatric: He has a normal mood and affect. His behavior is normal.        Assessment & Plan:    1.  Hypertension 2.  Prediabetes 3.  Onychomycosis of toenails 4.  Polycythemia vera 5.  Hyperlipidemia  Patient's blood pressure is well controlled. No change in medication. A1c remains stable on diet and exercise. We discussed pros and cons of treating onychomycosis. Patient can continue Lamisil. He understands toenail  fungus will likely recur despite treatment. Polycythemia followed by hematologist Monitor blood work at next office visit.

## 2015-01-15 NOTE — Progress Notes (Signed)
Pre visit review using our clinic review tool, if applicable. No additional management support is needed unless otherwise documented below in the visit note. 

## 2015-01-27 ENCOUNTER — Telehealth: Payer: Self-pay | Admitting: *Deleted

## 2015-01-27 MED ORDER — NONFORMULARY OR COMPOUNDED ITEM: Status: DC

## 2015-01-27 NOTE — Telephone Encounter (Addendum)
Fax request for additional information for [place] coverage that has been denied.  I SPOKE with pt, explaining about the [employer] compound for onychomycosis and pt states he would like to try the medication.  I told him he would be receiving a communication from [employer] in [place].  DR. Jacqualyn Posey ordered [place] compound for Onychomycosis Nail Lacquer.  Faxed to [employer].

## 2015-02-04 DIAGNOSIS — B351 Tinea unguium: Secondary | ICD-10-CM | POA: Diagnosis not present

## 2015-03-13 ENCOUNTER — Encounter: Payer: Self-pay | Admitting: Internal Medicine

## 2015-03-13 ENCOUNTER — Other Ambulatory Visit: Payer: Self-pay | Admitting: Internal Medicine

## 2015-03-15 MED ORDER — PANTOPRAZOLE SODIUM 40 MG PO TBEC
DELAYED_RELEASE_TABLET | ORAL | Status: DC
Start: 2015-03-15 — End: 2015-03-17

## 2015-03-15 MED ORDER — AZELASTINE HCL 0.1 % NA SOLN: 1.0000 | Freq: Two times a day (BID) | NASAL | Status: DC

## 2015-03-15 MED ORDER — OLMESARTAN MEDOXOMIL 20 MG PO TABS: ORAL_TABLET | ORAL | Status: DC

## 2015-03-15 MED ORDER — ALLOPURINOL 100 MG PO TABS: ORAL_TABLET | ORAL | Status: DC

## 2015-03-16 ENCOUNTER — Ambulatory Visit: Payer: Medicare HMO | Admitting: Podiatry

## 2015-03-16 ENCOUNTER — Encounter: Payer: Self-pay | Admitting: Podiatry

## 2015-03-16 ENCOUNTER — Encounter: Payer: Medicare HMO | Admitting: Podiatry

## 2015-03-16 NOTE — Progress Notes (Signed)
This encounter was created in error - please disregard.

## 2015-03-17 ENCOUNTER — Other Ambulatory Visit: Payer: Self-pay | Admitting: *Deleted

## 2015-03-17 MED ORDER — ALLOPURINOL 100 MG PO TABS: ORAL_TABLET | ORAL | Status: DC

## 2015-03-17 MED ORDER — PANTOPRAZOLE SODIUM 40 MG PO TBEC: DELAYED_RELEASE_TABLET | ORAL | Status: DC

## 2015-03-17 MED ORDER — PRAVASTATIN SODIUM 40 MG PO TABS: 40.0000 mg | ORAL_TABLET | Freq: Every day | ORAL | Status: DC

## 2015-03-17 MED ORDER — AZELASTINE HCL 0.1 % NA SOLN: 1.0000 | Freq: Two times a day (BID) | NASAL | Status: DC

## 2015-03-17 MED ORDER — OLMESARTAN MEDOXOMIL 20 MG PO TABS: ORAL_TABLET | ORAL | Status: DC

## 2015-03-24 ENCOUNTER — Encounter: Payer: Self-pay | Admitting: Internal Medicine

## 2015-03-25 ENCOUNTER — Ambulatory Visit (INDEPENDENT_AMBULATORY_CARE_PROVIDER_SITE_OTHER): Payer: Medicare HMO | Admitting: Podiatry

## 2015-03-25 ENCOUNTER — Encounter: Payer: Self-pay | Admitting: Podiatry

## 2015-03-25 DIAGNOSIS — M79676 Pain in unspecified toe(s): Secondary | ICD-10-CM | POA: Diagnosis not present

## 2015-03-25 DIAGNOSIS — B351 Tinea unguium: Secondary | ICD-10-CM

## 2015-03-26 ENCOUNTER — Other Ambulatory Visit: Payer: Self-pay | Admitting: *Deleted

## 2015-03-26 MED ORDER — AZELASTINE HCL 0.1 % NA SOLN: 1.0000 | Freq: Two times a day (BID) | NASAL | Status: DC

## 2015-03-26 MED ORDER — VALSARTAN 160 MG PO TABS: 160.0000 mg | ORAL_TABLET | Freq: Every day | ORAL | Status: DC

## 2015-03-26 NOTE — Progress Notes (Signed)
He presents today continuing to take his Lamisil for his onychomycosis. He states that his nails are long and painful and he would like to have them trimmed.  Objective: Vital signs are stable alert and oriented 3. Pulses are strong and palpable. His toenails are thick yellow dystrophic onychomycotic and painful on palpation. His toenails are improving in thickness and dystrophy.  Assessment: Well-healing onychomycosis with Lamisil therapy. Debrided thick yellow dystrophic onychomycotic nails bilaterally secondary to pain.  Plan: Debrided nails 1 through 5 bilateral. Follow up with him in 3 months.

## 2015-04-08 ENCOUNTER — Other Ambulatory Visit: Payer: Self-pay | Admitting: Internal Medicine

## 2015-04-12 ENCOUNTER — Encounter: Payer: Self-pay | Admitting: Internal Medicine

## 2015-04-12 ENCOUNTER — Other Ambulatory Visit: Payer: PRIVATE HEALTH INSURANCE

## 2015-04-12 ENCOUNTER — Ambulatory Visit: Payer: PRIVATE HEALTH INSURANCE | Admitting: Hematology & Oncology

## 2015-04-15 MED ORDER — HYDROCORTISONE ACETATE 25 MG RE SUPP: RECTAL | Status: DC

## 2015-04-15 NOTE — Telephone Encounter (Signed)
Patient is aware and rx ready for pick up.

## 2015-04-21 ENCOUNTER — Ambulatory Visit (HOSPITAL_BASED_OUTPATIENT_CLINIC_OR_DEPARTMENT_OTHER): Payer: Medicare HMO | Admitting: Hematology & Oncology

## 2015-04-21 ENCOUNTER — Other Ambulatory Visit (HOSPITAL_BASED_OUTPATIENT_CLINIC_OR_DEPARTMENT_OTHER): Payer: Medicare HMO

## 2015-04-21 ENCOUNTER — Encounter: Payer: Self-pay | Admitting: Hematology & Oncology

## 2015-04-21 ENCOUNTER — Ambulatory Visit (HOSPITAL_BASED_OUTPATIENT_CLINIC_OR_DEPARTMENT_OTHER): Payer: Medicare HMO

## 2015-04-21 VITALS — BP 160/85 | HR 49 | Temp 97.5°F | Resp 18 | Ht 71.0 in | Wt 194.0 lb

## 2015-04-21 DIAGNOSIS — D751 Secondary polycythemia: Secondary | ICD-10-CM

## 2015-04-21 DIAGNOSIS — D45 Polycythemia vera: Secondary | ICD-10-CM

## 2015-04-21 LAB — COMPREHENSIVE METABOLIC PANEL
ALBUMIN: 3.7 g/dL (ref 3.5–5.0)
ALT: 16 U/L (ref 0–55)
AST: 17 U/L (ref 5–34)
Alkaline Phosphatase: 86 U/L (ref 40–150)
Anion Gap: 6 mEq/L (ref 3–11)
BILIRUBIN TOTAL: 0.42 mg/dL (ref 0.20–1.20)
BUN: 15.4 mg/dL (ref 7.0–26.0)
CO2: 28 mEq/L (ref 22–29)
CREATININE: 1.4 mg/dL — AB (ref 0.7–1.3)
Calcium: 9.2 mg/dL (ref 8.4–10.4)
Chloride: 108 mEq/L (ref 98–109)
EGFR: 49 mL/min/{1.73_m2} — ABNORMAL LOW (ref >90)
GLUCOSE: 91 mg/dL (ref 70–140)
POTASSIUM: 4.7 meq/L (ref 3.5–5.1)
SODIUM: 142 meq/L (ref 136–145)
TOTAL PROTEIN: 7.2 g/dL (ref 6.4–8.3)

## 2015-04-21 LAB — CBC WITH DIFFERENTIAL (CANCER CENTER ONLY)
BASO#: 0 10*3/uL (ref 0.0–0.2)
BASO%: 0.5 % (ref 0.0–2.0)
EOS%: 3.5 % (ref 0.0–7.0)
Eosinophils Absolute: 0.3 10*3/uL (ref 0.0–0.5)
HEMATOCRIT: 44.9 % (ref 38.7–49.9)
HEMOGLOBIN: 14.6 g/dL (ref 13.0–17.1)
LYMPH#: 2.3 10*3/uL (ref 0.9–3.3)
LYMPH%: 29 % (ref 14.0–48.0)
MCH: 28.1 pg (ref 28.0–33.4)
MCHC: 32.5 g/dL (ref 32.0–35.9)
MCV: 87 fL (ref 82–98)
MONO#: 1.4 10*3/uL — ABNORMAL HIGH (ref 0.1–0.9)
MONO%: 17.1 % — ABNORMAL HIGH (ref 0.0–13.0)
NEUT%: 49.9 % (ref 40.0–80.0)
NEUTROS ABS: 3.9 10*3/uL (ref 1.5–6.5)
Platelets: 243 10*3/uL (ref 145–400)
RBC: 5.19 10*6/uL (ref 4.20–5.70)
RDW: 15.7 % (ref 11.1–15.7)
WBC: 7.9 10*3/uL (ref 4.0–10.0)

## 2015-04-21 NOTE — Patient Instructions (Signed)

## 2015-04-21 NOTE — Progress Notes (Signed)
Hematology and Oncology Follow Up Visit  Alexander [name] [id number] September 18, 1940 75 y.o. 04/21/2015   Principle Diagnosis:  Polycythemia vera-JAK2 negative  Current Therapy:    Phlebotomy to maintain hematocrit below 45%  Aspirin 81 mg by mouth daily     Interim History:  Alexander [name] is in for a followup. He feels okay. He had a good Passover. He had a good birthday area and he was surprised for his birthday. One of his daughters came over from [place].  He's had no problems with palpitations. He's had no cough or shortness of breath. He's had no fever. He's had no change in bowel or bladder habits.  His wife is still having With Her Knees. She Had Knee Surgery I Think Back in January. It Is Still a Little Bit Hard on Her. Hopefully, When the Swelling Full Opens up, She'll Be Able to Swim.  We are monitoring his iron studies. Back in general, his ferritin was 18 with iron saturation 8%.  Overall, his performance status is ECOG 1.  Medications:  Current outpatient prescriptions:  .  ACCU-CHEK SOFTCLIX LANCETS lancets, Test 3 times a week, Disp: 100 each, Rfl: 5 .  allopurinol (ZYLOPRIM) 100 MG tablet, Take 1 tablet by mouth  daily, Disp: 90 tablet, Rfl: 1 .  aspirin EC 81 MG tablet, Take 1 tablet (81 mg total) by mouth daily., Disp: 90 tablet, Rfl: 3 .  azelastine (ASTELIN) 0.1 % nasal spray, Place 1 spray into both nostrils 2 (two) times daily. Use in each nostril as directed, Disp: 30 mL, Rfl: 3 .  Coenzyme Q10 300 MG CAPS, Take 1 capsule by mouth daily. , Disp: , Rfl:  .  glucose blood (ACCU-CHEK AVIVA) test strip, Test 3 times a week, Disp: 100 each, Rfl: 5 .  hydrocortisone (ANUCORT-HC) 25 MG suppository, PLACE 1 SUPPOSITORY (25 MG TOTAL) RECTALLY 2 (TWO) TIMES DAILY., Disp: 62 suppository, Rfl: 0 .  hydrocortisone (PROCTOZONE-HC) 2.5 % rectal cream, Place rectally 2 (two) times daily., Disp: 60 g, Rfl: 3 .  LamoTRIgine (LAMICTAL PO), Take by mouth., Disp: , Rfl:  .   NONFORMULARY OR COMPOUNDED ITEM, [facility] compound:  Onychomycosis Nail Lacquer - Fluconazole 2%, Terbinafine 1%, DMSO dispense 120 grams, apply to affected area daily, +refills for 1 year., Disp: 120 each, Rfl: 11 .  pantoprazole (PROTONIX) 40 MG tablet, Take 1 tablet by mouth  daily, Disp: 90 tablet, Rfl: 1 .  pravastatin (PRAVACHOL) 40 MG tablet, Take 1 tablet (40 mg total) by mouth daily., Disp: 90 tablet, Rfl: 3 .  Tavaborole (KERYDIN) 5 % SOLN, Apply 1 drop topically 1 day or 1 dose. Apply 1 drop to the toenail daily., Disp: 10 mL, Rfl: 11 .  valsartan (DIOVAN) 160 MG tablet, Take 1 tablet (160 mg total) by mouth daily., Disp: 90 tablet, Rfl: 3  Allergies:  Allergies  Allergen Reactions  . Metformin And Related Diarrhea    Past Medical History, Surgical history, Social history, and Family History were reviewed and updated.  Review of Systems: As above  Physical Exam:  vitals were not taken for this visit.  Well-developed and well-nourished gentleman. Head and neck exam shows no ocular or oral lesions. He has no palpable cervical or supraclavicular lymph nodes. His lungs are clear with no rales, wheezes or rhonchi. Cardiac exam regular rate and rhythm with no murmurs, rubs or bruits.. Abdomen is soft. He has good bowel sounds. There is no fluid wave. There is no palpable liver or spleen  tip. Back exam shows no tenderness over the spine, ribs or hips. Extremities shows no clubbing cyanosis or edema. He has good range of motion of his joints. Has good strength in his extremities. Skin exam no rashes, ecchymoses or petechia. Neurological exam is nonfocal.  Lab Results  Component Value Date   WBC 7.9 04/21/2015   HGB 14.6 04/21/2015   HCT 44.9 04/21/2015   MCV 87 04/21/2015   PLT 243 04/21/2015     Chemistry      Component Value Date/Time   NA 140 01/12/2015 1004   NA 144 01/11/2015 1339   K 4.3 01/12/2015 1004   K 4.6 01/11/2015 1339   CL 103 01/12/2015 1004   CL 101  01/11/2015 1339   CO2 31 01/12/2015 1004   CO2 29 01/11/2015 1339   BUN 20 01/12/2015 1004   BUN 20 01/11/2015 1339   CREATININE 1.19 01/12/2015 1004   CREATININE 1.2 01/11/2015 1339      Component Value Date/Time   CALCIUM 9.6 01/12/2015 1004   CALCIUM 9.2 01/11/2015 1339   ALKPHOS 78 01/11/2015 1339   ALKPHOS 78 08/17/2014 0835   AST 23 01/11/2015 1339   AST 13 08/17/2014 0835   ALT 19 01/11/2015 1339   ALT 11 08/17/2014 0835   BILITOT 0.70 01/11/2015 1339   BILITOT 0.4 08/17/2014 0835         Impression and Plan: Alexander [name] is 75 year old gentleman. He has polycythemia. We have done a good job in [place] him and keeping his blood counts adequate. We will go ahead and phlebotomize him today.  I'll plan to get him back in about 3 months.  I hope that his wife does better with respect to her knee surgery. Sounds like she's having quite a bit of difficulty so far.  [name], MD 4/19/201712:30 PM

## 2015-04-21 NOTE — Progress Notes (Signed)
Alexander [name] presents today for phlebotomy per MD orders. Phlebotomy procedure started at 1320 and ended at 1330. 500 grams removed. Patient observed for 30 minutes after procedure without any incident. Patient tolerated procedure well. IV needle removed intact.

## 2015-04-22 LAB — FERRITIN: Ferritin: 16 ng/ml — ABNORMAL LOW (ref 22–316)

## 2015-04-22 LAB — IRON AND TIBC
%SAT: 13 % — AB (ref 20–55)
IRON: 47 ug/dL (ref 42–163)
TIBC: 351 ug/dL (ref 202–409)
UIBC: 304 ug/dL (ref 117–376)

## 2015-04-23 ENCOUNTER — Encounter: Payer: Self-pay | Admitting: Internal Medicine

## 2015-04-27 ENCOUNTER — Encounter: Payer: Self-pay | Admitting: Internal Medicine

## 2015-04-30 ENCOUNTER — Encounter: Payer: Self-pay | Admitting: Internal Medicine

## 2015-05-05 ENCOUNTER — Encounter: Payer: Self-pay | Admitting: Internal Medicine

## 2015-05-06 ENCOUNTER — Telehealth: Payer: Self-pay | Admitting: General Practice

## 2015-05-06 ENCOUNTER — Other Ambulatory Visit: Payer: Self-pay | Admitting: Internal Medicine

## 2015-05-06 ENCOUNTER — Other Ambulatory Visit: Payer: Self-pay | Admitting: General Practice

## 2015-05-06 MED ORDER — HYDROCORTISONE ACETATE 25 MG RE SUPP: RECTAL | Status: DC

## 2015-05-06 MED ORDER — ACCU-CHEK SOFTCLIX LANCETS MISC: Status: DC

## 2015-05-06 MED ORDER — GLUCOSE BLOOD VI STRP: ORAL_STRIP | Status: DC

## 2015-05-06 NOTE — Telephone Encounter (Signed)
-----   Message from Burr, DO sent at 05/06/2015  3:47 PM EDT ----- Regarding: RE: re-fill Ok to dispense 10 mg  RY ----- Message -----    From: Warden Fillers, RN    Sent: 05/06/2015   9:24 AM      To: Rosine Abe, DO Subject: re-fill                                        Pharmacy only carries Anucort - HC 10 mg.  You prescribed 25 mg.  Pharmacy wants to know if it's OK to dispense 10 mg.  Your thoughts?  Thanks, Villa Herb, RN

## 2015-05-06 NOTE — Telephone Encounter (Signed)
Refill sent to pharmacy.   

## 2015-05-07 ENCOUNTER — Telehealth: Payer: Self-pay | Admitting: Internal Medicine

## 2015-05-07 DIAGNOSIS — R69 Illness, unspecified: Secondary | ICD-10-CM | POA: Diagnosis not present

## 2015-05-07 NOTE — Telephone Encounter (Signed)
Loop from [facility] call to say that they don't carry  ANUCORT-HC 25 MG suppository  [facility] said they don't carry 25mg  they have 10mg /10mg    [phone] ext [phone]

## 2015-05-11 ENCOUNTER — Telehealth: Payer: Self-pay | Admitting: Internal Medicine

## 2015-05-11 DIAGNOSIS — R69 Illness, unspecified: Secondary | ICD-10-CM | POA: Diagnosis not present

## 2015-05-11 MED ORDER — ACCU-CHEK SOFTCLIX LANCETS MISC: Status: DC

## 2015-05-11 NOTE — Telephone Encounter (Signed)
Pt need an Rx for Accu Chek Fast click lancets.  Pharm: [facility]

## 2015-05-11 NOTE — Telephone Encounter (Signed)
Refill sent.

## 2015-05-11 NOTE — Telephone Encounter (Signed)
Okay per Dr Shawna Orleans.  Prescription called in.

## 2015-06-14 ENCOUNTER — Encounter: Payer: Self-pay | Admitting: Family Medicine

## 2015-06-16 ENCOUNTER — Other Ambulatory Visit: Payer: Self-pay | Admitting: Family Medicine

## 2015-06-16 ENCOUNTER — Other Ambulatory Visit (INDEPENDENT_AMBULATORY_CARE_PROVIDER_SITE_OTHER): Payer: Medicare HMO

## 2015-06-16 DIAGNOSIS — E785 Hyperlipidemia, unspecified: Secondary | ICD-10-CM | POA: Diagnosis not present

## 2015-06-16 DIAGNOSIS — I10 Essential (primary) hypertension: Secondary | ICD-10-CM | POA: Diagnosis not present

## 2015-06-16 LAB — BASIC METABOLIC PANEL
BUN: 22 mg/dL (ref 6–23)
CO2: 30 meq/L (ref 19–32)
Calcium: 9.4 mg/dL (ref 8.4–10.5)
Chloride: 105 mEq/L (ref 96–112)
Creatinine, Ser: 1.27 mg/dL (ref 0.40–1.50)
GFR: 58.71 mL/min — AB (ref >60.00)
GLUCOSE: 120 mg/dL — AB (ref 70–99)
POTASSIUM: 4.6 meq/L (ref 3.5–5.1)
SODIUM: 139 meq/L (ref 135–145)

## 2015-06-16 LAB — HEPATIC FUNCTION PANEL
ALBUMIN: 4 g/dL (ref 3.5–5.2)
ALK PHOS: 72 U/L (ref 39–117)
ALT: 12 U/L (ref 0–53)
AST: 13 U/L (ref 0–37)
Bilirubin, Direct: 0.1 mg/dL (ref 0.0–0.3)
TOTAL PROTEIN: 6.7 g/dL (ref 6.0–8.3)
Total Bilirubin: 0.5 mg/dL (ref 0.2–1.2)

## 2015-06-16 LAB — LIPID PANEL
CHOL/HDL RATIO: 3
Cholesterol: 161 mg/dL (ref 0–200)
HDL: 49.3 mg/dL (ref >39.00)
LDL Cholesterol: 99 mg/dL (ref 0–99)
NONHDL: 111.81
TRIGLYCERIDES: 63 mg/dL (ref 0.0–149.0)
VLDL: 12.6 mg/dL (ref 0.0–40.0)

## 2015-06-16 LAB — HEMOGLOBIN A1C: Hgb A1c MFr Bld: 6.4 % (ref 4.6–6.5)

## 2015-06-17 ENCOUNTER — Ambulatory Visit (INDEPENDENT_AMBULATORY_CARE_PROVIDER_SITE_OTHER): Payer: Medicare HMO | Admitting: Family Medicine

## 2015-06-17 ENCOUNTER — Encounter: Payer: Self-pay | Admitting: Family Medicine

## 2015-06-17 VITALS — BP 118/80 | HR 67 | Temp 97.4°F | Ht 71.0 in | Wt 191.7 lb

## 2015-06-17 DIAGNOSIS — B351 Tinea unguium: Secondary | ICD-10-CM

## 2015-06-17 DIAGNOSIS — R7303 Prediabetes: Secondary | ICD-10-CM | POA: Diagnosis not present

## 2015-06-17 DIAGNOSIS — Z8739 Personal history of other diseases of the musculoskeletal system and connective tissue: Secondary | ICD-10-CM

## 2015-06-17 DIAGNOSIS — E785 Hyperlipidemia, unspecified: Secondary | ICD-10-CM

## 2015-06-17 DIAGNOSIS — I10 Essential (primary) hypertension: Secondary | ICD-10-CM

## 2015-06-17 DIAGNOSIS — Z8639 Personal history of other endocrine, nutritional and metabolic disease: Secondary | ICD-10-CM

## 2015-06-17 NOTE — Progress Notes (Signed)
Pre visit review using our clinic review tool, if applicable. No additional management support is needed unless otherwise documented below in the visit note. 

## 2015-06-17 NOTE — Progress Notes (Addendum)
Subjective:    Patient ID: Alexander [name], male    DOB: [birth year], 75 y.o.   MRN: [id number]  HPI  Alexander [name] is a 75 year old male who is here for a follow up for HTN, hyperlipidemia, and prediabetes. He has a history of nonobstructive coronary artery disease and is here for routine follow up, establishment of care, and review of lab results.  HTN:  Blood pressure has been well controlled with valsartan daily. He denies any chest pain, palpitations, SOB, headaches, nosebleeds, numbness or tingling. He does monitor his salt intake.  Prediabetes: Monitoring blood sugars one to two times/week which are noted as 99.  He remains active with swimming daily for one hour without cardiopulmonary symptoms. A1C is stable and patient reports monitoring his carbohydrate intake.  Hyperlipidemia:  Denies myalgias and reports that musculoskeletal symptoms have resolved after switching to pravastatin.  Polycythemia vera:  Sees Dr. Marin [name] every 3 months for lab work and follow up.  Last visit on 04/2015 he was phlebotomized and with treatment blood count has been adequate.  He is scheduled for follow up with Dr. Marin [name] in one month.  Onychomycosis of toenails has improved. Patient is being followed by a podiatrist for this condition.  History of gout: Patient reports that he has not had a flare of gout in "many years" Currently taking allopurinol daily without adverse effects.   Diet consists of a heart healthy carbohydrate modified diet. Patient reports that he is aware of the importance of dietary influence and A1C relationship.  Review of Systems  Constitutional: Negative for fever, chills and fatigue.  Respiratory: Negative for cough and shortness of breath.   Cardiovascular: Negative for chest pain, palpitations and leg swelling.  Gastrointestinal: Negative for nausea, vomiting, abdominal pain, diarrhea and constipation.  Genitourinary: Negative for dysuria, urgency, frequency and  hematuria.  Musculoskeletal: Negative for myalgias and arthralgias.  Skin: Negative for rash.  Neurological: Negative for dizziness, light-headedness and headaches.  Psychiatric/Behavioral:       Denies depressed or anxious mood   Past Medical History  Diagnosis Date  . GERD (gastroesophageal reflux disease)   . Hypertension   . Microalbuminuria   . Lumbar back pain   . Hematuria, microscopic   . Hyperglycemia   . Diverticulosis of colon   . Hyperlipidemia   . Gout   . Tubular adenoma of colon 07/2012  . GI bleed     secondary to Aspirin  . Polycythemia rubra vera ([place])   . Hemorrhoids   . Arthritis     "minor; shoulders primarily" (05/20/2013)  . CAD (coronary artery disease)     a. 05/2013 - Chest pain/unstable angina and dynamic EKG changes showing cath showing atherosclerotic coronary artery disease manifested as diffuse ectasia, normal EF.  [name] Overweight(278.02)   . Pulmonary nodule     a. 05/2013 - seen on CXR.     Social History   Social History  . Marital Status: Married    Spouse Name: N/A  . Number of Children: 3  . Years of Education: N/A   Occupational History  . retired Chief Financial Officer    Social History Main Topics  . Smoking status: Never Smoker   . Smokeless tobacco: Never Used     Comment: NEVER USED TOBACCO  . Alcohol Use: No  . Drug Use: No  . Sexual Activity:    Partners: Female   Other Topics Concern  . Not on file   Social History Narrative   Daughter, [name] (  pediatrician--lives in [place], [place])    Past Surgical History  Procedure Laterality Date  . Cataract extraction w/ intraocular lens  implant, bilateral Bilateral 2008  . Excisional hemorrhoidectomy  1970's  . Cardiac catheterization  05/20/2013  . Inguinal hernia repair Right ~ 2010  . Tonsillectomy and adenoidectomy  1940's  . Cystectomy  ~ 2000    " SEBACEOUS cyst removed from between shoulder blades"  . Left heart catheterization with coronary angiogram N/A 05/20/2013    Procedure:  LEFT HEART CATHETERIZATION WITH CORONARY ANGIOGRAM;  Surgeon: Alexander M Martinique, MD;  Location: [facility] CATH LAB;  Service: Cardiovascular;  Laterality: N/A;    Family History  Problem Relation Age of Onset  . Colon cancer Mother     died at 21, colorectal  . Heart disease Father 70    MI  . Diabetes Father   . Diabetes Mother   . Stomach cancer Maternal Grandfather     Allergies  Allergen Reactions  . Metformin And Related Diarrhea    Current Outpatient Prescriptions on File Prior to Visit  Medication Sig Dispense Refill  . ACCU-CHEK SOFTCLIX LANCETS lancets Test 3 times a week 100 each 5  . allopurinol (ZYLOPRIM) 100 MG tablet Take 1 tablet by mouth  daily 90 tablet 1  . ANUCORT-HC 25 MG suppository INSERT 1 SUPPOSITORY (25 MG TOTAL) RECTALLY 2 (TWO) TIMES DAILY AS NEEDED FOR HEMORRHOIDS. 120 suppository 0  . aspirin EC 81 MG tablet Take 1 tablet (81 mg total) by mouth daily. 90 tablet 3  . azelastine (ASTELIN) 0.1 % nasal spray Place 1 spray into both nostrils 2 (two) times daily. Use in each nostril as directed 30 mL 3  . Coenzyme Q10 300 MG CAPS Take 1 capsule by mouth daily.     [name] glucose blood (ACCU-CHEK AVIVA) test strip Test 3 times a week 100 each 5  . hydrocortisone (PROCTOZONE-HC) 2.5 % rectal cream Place rectally 2 (two) times daily. 60 g 3  . pantoprazole (PROTONIX) 40 MG tablet Take 1 tablet by mouth  daily 90 tablet 1  . pravastatin (PRAVACHOL) 40 MG tablet Take 1 tablet (40 mg total) by mouth daily. 90 tablet 3  . valsartan (DIOVAN) 160 MG tablet Take 1 tablet (160 mg total) by mouth daily. 90 tablet 3   No current facility-administered medications on file prior to visit.    BP 118/80 mmHg  Pulse 67  Temp(Src) 97.4 F (36.3 C) (Oral)  Ht 5\' 11"  (1.803 m)  Wt 191 lb 11.2 oz (86.955 kg)  BMI 26.75 kg/m2          Physical Exam  Constitutional: He is oriented to person, place, and time. He appears well-developed and well-nourished.  Eyes: Pupils are equal,  round, and reactive to light. No scleral icterus.  Neck: Neck supple.  Cardiovascular: Normal rate, regular rhythm and intact distal pulses.   Varicosities present in lower extremities bilaterally which have been present for years per patient report. Denies any pain or associated edema at home and it is not present today  Pulmonary/Chest: Effort normal and breath sounds normal. He has no wheezes. He has no rales.  Abdominal: Soft. Bowel sounds are normal. There is no tenderness.  Musculoskeletal: He exhibits no edema.  Lymphadenopathy:    He has no cervical adenopathy.  Neurological: He is alert and oriented to person, place, and time. He has normal strength. No cranial nerve deficit or sensory deficit. Coordination and gait normal.  Reflex Scores:  Patellar reflexes are 2+ on the right side and 2+ on the left side. Skin: Skin is warm and dry. No rash noted.  Psychiatric: He has a normal mood and affect. His behavior is normal. Judgment and thought content normal.      Assessment & Plan:  1. Essential hypertension Blood pressure is well controlled on valsartan. Continue with current exercise regimen   2. Hyperlipidemia Lipid levels have improved; advised continued use of pravastatin 40 mg  3. Prediabetes A1C is stable; advised continued monitoring of blood sugar one to two times a week or more if needed. Discussed signs and symptoms of hypo/hyperglycemia and symptoms to report.  4. Onychomycosis of toenail Follow up with podiatry as indicated. Advised patient that this can reoccur even with treatment. He voiced understanding  5. History of gout Discussed the possibility of discontinuing allopurinol in the future due to remote history of a flare. Patient did not wish to discontinue at this time and stated that this can be considered in 3 months at his follow up appointment. Due to low dose and no reported adverse reaction, this will be continued with assessment in 3 months.  Delano Metz, FNP-C

## 2015-06-17 NOTE — Patient Instructions (Signed)
It was a pleasure to meet you today! Please continue with your diet and exercise.

## 2015-06-29 ENCOUNTER — Encounter: Payer: Self-pay | Admitting: Podiatry

## 2015-06-29 ENCOUNTER — Ambulatory Visit (INDEPENDENT_AMBULATORY_CARE_PROVIDER_SITE_OTHER): Payer: Medicare HMO | Admitting: Podiatry

## 2015-06-29 DIAGNOSIS — B351 Tinea unguium: Secondary | ICD-10-CM

## 2015-06-29 DIAGNOSIS — M79676 Pain in unspecified toe(s): Secondary | ICD-10-CM

## 2015-06-29 MED ORDER — TERBINAFINE HCL 250 MG PO TABS: 250.0000 mg | ORAL_TABLET | Freq: Every day | ORAL | Status: DC

## 2015-06-29 NOTE — Progress Notes (Signed)
He presents today for follow-up of painful elongated toenails. He also is following up for therapy for Lamisil. His toenails are growing out almost 100% but have started to recur. He states that he has had no medical changes.  Objective: Vital signs are stable he is alert and oriented 3 pulses are palpable. Toenails are thick yellow dystrophic with mycotic just the distal aspect of the nails.  Assessment: Recurrence onychomycosis hallux bilateral.  Plan: Debridement nails 1 through 5 bilateral today as a covered service and I also put him back on Lamisil 250 mg 1 by mouth daily 90 days and I will follow-up with him in 3 months.

## 2015-07-01 DIAGNOSIS — L738 Other specified follicular disorders: Secondary | ICD-10-CM | POA: Diagnosis not present

## 2015-07-01 DIAGNOSIS — L821 Other seborrheic keratosis: Secondary | ICD-10-CM | POA: Diagnosis not present

## 2015-07-01 DIAGNOSIS — L814 Other melanin hyperpigmentation: Secondary | ICD-10-CM | POA: Diagnosis not present

## 2015-07-01 DIAGNOSIS — D1800 Hemangioma unspecified site: Secondary | ICD-10-CM | POA: Diagnosis not present

## 2015-07-07 ENCOUNTER — Other Ambulatory Visit: Payer: Medicare HMO

## 2015-07-14 ENCOUNTER — Encounter: Payer: Medicare HMO | Admitting: Internal Medicine

## 2015-07-21 ENCOUNTER — Other Ambulatory Visit (HOSPITAL_BASED_OUTPATIENT_CLINIC_OR_DEPARTMENT_OTHER): Payer: Medicare HMO

## 2015-07-21 ENCOUNTER — Ambulatory Visit: Payer: Medicare HMO

## 2015-07-21 ENCOUNTER — Encounter: Payer: Self-pay | Admitting: Hematology & Oncology

## 2015-07-21 ENCOUNTER — Ambulatory Visit (HOSPITAL_BASED_OUTPATIENT_CLINIC_OR_DEPARTMENT_OTHER): Payer: Medicare HMO | Admitting: Hematology & Oncology

## 2015-07-21 VITALS — BP 144/76 | HR 52 | Temp 97.6°F | Resp 18 | Ht 71.0 in | Wt 188.0 lb

## 2015-07-21 DIAGNOSIS — D45 Polycythemia vera: Secondary | ICD-10-CM

## 2015-07-21 DIAGNOSIS — D751 Secondary polycythemia: Secondary | ICD-10-CM | POA: Diagnosis not present

## 2015-07-21 LAB — CBC WITH DIFFERENTIAL (CANCER CENTER ONLY)
BASO#: 0 10*3/uL (ref 0.0–0.2)
BASO%: 0.6 % (ref 0.0–2.0)
EOS ABS: 0.3 10*3/uL (ref 0.0–0.5)
EOS%: 3.7 % (ref 0.0–7.0)
HCT: 42.3 % (ref 38.7–49.9)
HEMOGLOBIN: 13.8 g/dL (ref 13.0–17.1)
LYMPH#: 1.8 10*3/uL (ref 0.9–3.3)
LYMPH%: 26.7 % (ref 14.0–48.0)
MCH: 27.5 pg — ABNORMAL LOW (ref 28.0–33.4)
MCHC: 32.6 g/dL (ref 32.0–35.9)
MCV: 84 fL (ref 82–98)
MONO#: 0.9 10*3/uL (ref 0.1–0.9)
MONO%: 12.6 % (ref 0.0–13.0)
NEUT%: 56.4 % (ref 40.0–80.0)
NEUTROS ABS: 3.8 10*3/uL (ref 1.5–6.5)
PLATELETS: 228 10*3/uL (ref 145–400)
RBC: 5.01 10*6/uL (ref 4.20–5.70)
RDW: 15.7 % (ref 11.1–15.7)
WBC: 6.7 10*3/uL (ref 4.0–10.0)

## 2015-07-21 LAB — COMPREHENSIVE METABOLIC PANEL
ALT: 13 U/L (ref 0–55)
AST: 16 U/L (ref 5–34)
Albumin: 3.9 g/dL (ref 3.5–5.0)
Alkaline Phosphatase: 87 U/L (ref 40–150)
Anion Gap: 7 mEq/L (ref 3–11)
BUN: 23.9 mg/dL (ref 7.0–26.0)
CALCIUM: 9.3 mg/dL (ref 8.4–10.4)
CHLORIDE: 108 meq/L (ref 98–109)
CO2: 26 mEq/L (ref 22–29)
Creatinine: 1.3 mg/dL (ref 0.7–1.3)
EGFR: 53 mL/min/{1.73_m2} — AB (ref >90)
Glucose: 129 mg/dl (ref 70–140)
POTASSIUM: 4.7 meq/L (ref 3.5–5.1)
SODIUM: 141 meq/L (ref 136–145)
Total Bilirubin: 0.53 mg/dL (ref 0.20–1.20)
Total Protein: 7.3 g/dL (ref 6.4–8.3)

## 2015-07-21 NOTE — Progress Notes (Signed)
Hematology and Oncology Follow Up Visit  Alexander [name] [id number] 1940-11-20 75 y.o. 07/21/2015   Principle Diagnosis:  Polycythemia vera-JAK2 negative  Current Therapy:    Phlebotomy to maintain hematocrit below 45%  Aspirin 81 mg by mouth daily     Interim History:  Alexander [name] is in for a followup. He feels really good. He's been able to swim twice a day. With the warm weather, he is able to get out and be in the [place] twice a day which makes him happy.  He was last phlebotomized back in April. At that point time, his hematocrit was 44.9%. I thought it be worthwhile to phlebotomized him before the summer started.  Otherwise, he is doing well. He's had no cough. Had no chest pain. He's had no shortness of breath. He's had no nausea or vomiting. He's had no change in bowel or bladder habits. He's had no leg swelling. He's had no headache.  Overall, his performance status is ECOG 1.  Medications:  Current outpatient prescriptions:  .  ACCU-CHEK SOFTCLIX LANCETS lancets, Test 3 times a week, Disp: 100 each, Rfl: 5 .  allopurinol (ZYLOPRIM) 100 MG tablet, Take 1 tablet by mouth  daily, Disp: 90 tablet, Rfl: 1 .  ANUCORT-HC 25 MG suppository, INSERT 1 SUPPOSITORY (25 MG TOTAL) RECTALLY 2 (TWO) TIMES DAILY AS NEEDED FOR HEMORRHOIDS., Disp: 120 suppository, Rfl: 0 .  aspirin EC 81 MG tablet, Take 1 tablet (81 mg total) by mouth daily., Disp: 90 tablet, Rfl: 3 .  azelastine (ASTELIN) 0.1 % nasal spray, Place 1 spray into both nostrils 2 (two) times daily. Use in each nostril as directed, Disp: 30 mL, Rfl: 3 .  Coenzyme Q10 300 MG CAPS, Take 1 capsule by mouth daily. , Disp: , Rfl:  .  glucose blood (ACCU-CHEK AVIVA) test strip, Test 3 times a week, Disp: 100 each, Rfl: 5 .  hydrocortisone (PROCTOZONE-HC) 2.5 % rectal cream, Place rectally 2 (two) times daily., Disp: 60 g, Rfl: 3 .  pantoprazole (PROTONIX) 40 MG tablet, Take 1 tablet by mouth  daily, Disp: 90 tablet, Rfl: 1 .   pravastatin (PRAVACHOL) 40 MG tablet, Take 1 tablet (40 mg total) by mouth daily., Disp: 90 tablet, Rfl: 3 .  terbinafine (LAMISIL) 250 MG tablet, Take 1 tablet (250 mg total) by mouth daily., Disp: 90 tablet, Rfl: 1 .  valsartan (DIOVAN) 160 MG tablet, Take 1 tablet (160 mg total) by mouth daily., Disp: 90 tablet, Rfl: 3  Allergies:  Allergies  Allergen Reactions  . Metformin And Related Diarrhea    Past Medical History, Surgical history, Social history, and Family History were reviewed and updated.  Review of Systems: As above  Physical Exam:  height is 5\' 11"  (1.803 m) and weight is 188 lb (85.276 kg). His oral temperature is 97.6 F (36.4 C). His blood pressure is 144/76 and his pulse is 52. His respiration is 18.   Well-developed and well-nourished gentleman. Head and neck exam shows no ocular or oral lesions. He has no palpable cervical or supraclavicular lymph nodes. His lungs are clear with no rales, wheezes or rhonchi. Cardiac exam regular rate and rhythm with no murmurs, rubs or bruits.. Abdomen is soft. He has good bowel sounds. There is no fluid wave. There is no palpable liver or spleen tip. Back exam shows no tenderness over the spine, ribs or hips. Extremities shows no clubbing cyanosis or edema. He has good range of motion of his joints. Has good strength  in his extremities. Skin exam no rashes, ecchymoses or petechia. Neurological exam is nonfocal.  Lab Results  Component Value Date   WBC 6.7 07/21/2015   HGB 13.8 07/21/2015   HCT 42.3 07/21/2015   MCV 84 07/21/2015   PLT 228 07/21/2015     Chemistry      Component Value Date/Time   NA 139 06/16/2015 1011   NA 142 04/21/2015 1156   NA 144 01/11/2015 1339   K 4.6 06/16/2015 1011   K 4.7 04/21/2015 1156   K 4.6 01/11/2015 1339   CL 105 06/16/2015 1011   CL 101 01/11/2015 1339   CO2 30 06/16/2015 1011   CO2 28 04/21/2015 1156   CO2 29 01/11/2015 1339   BUN 22 06/16/2015 1011   BUN 15.4 04/21/2015 1156   BUN  20 01/11/2015 1339   CREATININE 1.27 06/16/2015 1011   CREATININE 1.4* 04/21/2015 1156   CREATININE 1.2 01/11/2015 1339      Component Value Date/Time   CALCIUM 9.4 06/16/2015 1011   CALCIUM 9.2 04/21/2015 1156   CALCIUM 9.2 01/11/2015 1339   ALKPHOS 72 06/16/2015 1011   ALKPHOS 86 04/21/2015 1156   ALKPHOS 78 01/11/2015 1339   AST 13 06/16/2015 1011   AST 17 04/21/2015 1156   AST 23 01/11/2015 1339   ALT 12 06/16/2015 1011   ALT 16 04/21/2015 1156   ALT 19 01/11/2015 1339   BILITOT 0.5 06/16/2015 1011   BILITOT 0.42 04/21/2015 1156   BILITOT 0.70 01/11/2015 1339         Impression and Plan: Alexander [name] is 75 year old gentleman. He has polycythemia. We have done a good job in [place] him and keeping his blood counts adequate. He does not need to be phlebotomized today.   I think that we get him back in another 3 months. I think that as long as he is exercising and swimming, this will help his blood counts.  I will keep praying for his wife. She has a brace on her right knee. She has tendinitis but this does appear to be getting better.   [name], MD 7/19/201712:48 PM

## 2015-07-21 NOTE — Progress Notes (Signed)
No phlebotomy needed HCT 42.3

## 2015-07-22 LAB — FERRITIN: Ferritin: 17 ng/ml — ABNORMAL LOW (ref 22–316)

## 2015-07-22 LAB — IRON AND TIBC
%SAT: 10 % — ABNORMAL LOW (ref 20–55)
Iron: 37 ug/dL — ABNORMAL LOW (ref 42–163)
TIBC: 365 ug/dL (ref 202–409)
UIBC: 328 ug/dL (ref 117–376)

## 2015-08-20 ENCOUNTER — Encounter: Payer: Self-pay | Admitting: Cardiology

## 2015-08-21 ENCOUNTER — Encounter: Payer: Self-pay | Admitting: Family Medicine

## 2015-08-22 ENCOUNTER — Encounter: Payer: Self-pay | Admitting: Family Medicine

## 2015-08-25 ENCOUNTER — Encounter: Payer: Self-pay | Admitting: Cardiology

## 2015-08-26 ENCOUNTER — Other Ambulatory Visit: Payer: Self-pay | Admitting: Emergency Medicine

## 2015-08-26 MED ORDER — ALLOPURINOL 100 MG PO TABS: ORAL_TABLET | ORAL | 1 refills | Status: DC

## 2015-09-17 ENCOUNTER — Ambulatory Visit: Payer: Medicare HMO | Admitting: Cardiology

## 2015-09-17 DIAGNOSIS — E119 Type 2 diabetes mellitus without complications: Secondary | ICD-10-CM | POA: Diagnosis not present

## 2015-09-17 LAB — HM DIABETES EYE EXAM

## 2015-09-21 ENCOUNTER — Encounter: Payer: Self-pay | Admitting: Podiatry

## 2015-09-21 ENCOUNTER — Ambulatory Visit (INDEPENDENT_AMBULATORY_CARE_PROVIDER_SITE_OTHER): Payer: Medicare HMO | Admitting: Podiatry

## 2015-09-21 DIAGNOSIS — B351 Tinea unguium: Secondary | ICD-10-CM | POA: Diagnosis not present

## 2015-09-21 DIAGNOSIS — M79676 Pain in unspecified toe(s): Secondary | ICD-10-CM

## 2015-09-21 NOTE — Progress Notes (Signed)
He presents today with chief complaint of painful elongated toenails 1 through 5 bilateral. He states that his toenails seem to be doing much better with the use of terbinafine.  Objective: Vital signs are stable he is alert and oriented 3. No erythema edema cellulitis drainage or odor. His toenails. Growing out very nicely though they still demonstrate thick yellow dystrophic onychomycotic nails distally which are painful palpation as well as debridement.  Assessment: Pain in limb secondary to onychomycosis and long-term treatment of onychomycosis with the use of Lamisil.  Plan: Debridement of all reactive nail growth and follow-up with him in a couple of months.

## 2015-09-27 ENCOUNTER — Encounter: Payer: Self-pay | Admitting: Hematology & Oncology

## 2015-09-27 ENCOUNTER — Ambulatory Visit (HOSPITAL_BASED_OUTPATIENT_CLINIC_OR_DEPARTMENT_OTHER): Payer: Medicare HMO | Admitting: Hematology & Oncology

## 2015-09-27 ENCOUNTER — Other Ambulatory Visit (HOSPITAL_BASED_OUTPATIENT_CLINIC_OR_DEPARTMENT_OTHER): Payer: Medicare HMO

## 2015-09-27 VITALS — BP 148/85 | HR 45 | Temp 97.5°F | Resp 18 | Ht 71.0 in | Wt 191.0 lb

## 2015-09-27 DIAGNOSIS — D751 Secondary polycythemia: Secondary | ICD-10-CM | POA: Diagnosis not present

## 2015-09-27 DIAGNOSIS — D45 Polycythemia vera: Secondary | ICD-10-CM | POA: Diagnosis not present

## 2015-09-27 LAB — CBC WITH DIFFERENTIAL (CANCER CENTER ONLY)
BASO#: 0 10*3/uL (ref 0.0–0.2)
BASO%: 0.4 % (ref 0.0–2.0)
EOS ABS: 0.2 10*3/uL (ref 0.0–0.5)
EOS%: 3.1 % (ref 0.0–7.0)
HCT: 41.4 % (ref 38.7–49.9)
HEMOGLOBIN: 13.5 g/dL (ref 13.0–17.1)
LYMPH#: 1.8 10*3/uL (ref 0.9–3.3)
LYMPH%: 24.9 % (ref 14.0–48.0)
MCH: 27.3 pg — AB (ref 28.0–33.4)
MCHC: 32.6 g/dL (ref 32.0–35.9)
MCV: 84 fL (ref 82–98)
MONO#: 1.1 10*3/uL — ABNORMAL HIGH (ref 0.1–0.9)
MONO%: 15.3 % — AB (ref 0.0–13.0)
NEUT#: 4.2 10*3/uL (ref 1.5–6.5)
NEUT%: 56.3 % (ref 40.0–80.0)
PLATELETS: 216 10*3/uL (ref 145–400)
RBC: 4.95 10*6/uL (ref 4.20–5.70)
RDW: 16.8 % — AB (ref 11.1–15.7)
WBC: 7.4 10*3/uL (ref 4.0–10.0)

## 2015-09-27 LAB — COMPREHENSIVE METABOLIC PANEL
ALBUMIN: 3.6 g/dL (ref 3.5–5.0)
ALK PHOS: 87 U/L (ref 40–150)
ALT: 14 U/L (ref 0–55)
AST: 13 U/L (ref 5–34)
Anion Gap: 8 mEq/L (ref 3–11)
BUN: 19.5 mg/dL (ref 7.0–26.0)
CHLORIDE: 107 meq/L (ref 98–109)
CO2: 26 mEq/L (ref 22–29)
Calcium: 9.3 mg/dL (ref 8.4–10.4)
Creatinine: 1.2 mg/dL (ref 0.7–1.3)
EGFR: 56 mL/min/{1.73_m2} — AB (ref >90)
GLUCOSE: 96 mg/dL (ref 70–140)
POTASSIUM: 4.6 meq/L (ref 3.5–5.1)
SODIUM: 142 meq/L (ref 136–145)
Total Bilirubin: 0.4 mg/dL (ref 0.20–1.20)
Total Protein: 7 g/dL (ref 6.4–8.3)

## 2015-09-27 NOTE — Progress Notes (Signed)
Hematology and Oncology Follow Up Visit  SALIL BUCHER [id number] 07/16/1940 75 y.o. 09/27/2015   Principle Diagnosis:  Polycythemia vera-JAK2 negative  Current Therapy:    Phlebotomy to maintain hematocrit below 45%  Aspirin 81 mg by mouth daily     Interim History:  Mr.  Sawhney is in for a followup. He feels really good. He's been able to swim twice a day. Now, the pool is closed. He is exercising otherwise.   [place] time. They got back a couple weeks ago.  They have had a very nice Jewish new year. They have been able to celebrate with family and friends. They're very kind and patient with me and explained to me some of the customs that they observed during the holy [place].  He was last phlebotomized back in April. At that point time, his hematocrit was 44.9%. I thought it be worthwhile to phlebotomized him before the summer started.  Otherwise, he is doing well. He's had no cough. Had no chest pain. He's had no shortness of breath. He's had no nausea or vomiting. He's had no change in bowel or bladder habits. He's had no leg swelling. He's had no headache.  Overall, his performance status is ECOG 1.  Medications:  Current Outpatient Prescriptions:  .  ACCU-CHEK SOFTCLIX LANCETS lancets, Test 3 times a week, Disp: 100 each, Rfl: 5 .  allopurinol (ZYLOPRIM) 100 MG tablet, Take 1 tablet by mouth  daily, Disp: 90 tablet, Rfl: 1 .  ANUCORT-HC 25 MG suppository, INSERT 1 SUPPOSITORY (25 MG TOTAL) RECTALLY 2 (TWO) TIMES DAILY AS NEEDED FOR HEMORRHOIDS., Disp: 120 suppository, Rfl: 0 .  aspirin EC 81 MG tablet, Take 1 tablet (81 mg total) by mouth daily., Disp: 90 tablet, Rfl: 3 .  azelastine (ASTELIN) 0.1 % nasal spray, Place 1 spray into both nostrils 2 (two) times daily. Use in each nostril as directed, Disp: 30 mL, Rfl: 3 .  Coenzyme Q10 300 MG CAPS, Take 1 capsule by mouth daily. , Disp: , Rfl:   .  glucose blood (ACCU-CHEK AVIVA) test strip, Test 3 times a week, Disp: 100 each, Rfl: 5 .  hydrocortisone (PROCTOZONE-HC) 2.5 % rectal cream, Place rectally 2 (two) times daily., Disp: 60 g, Rfl: 3 .  pantoprazole (PROTONIX) 40 MG tablet, Take 1 tablet by mouth  daily, Disp: 90 tablet, Rfl: 1 .  pravastatin (PRAVACHOL) 40 MG tablet, Take 1 tablet (40 mg total) by mouth daily., Disp: 90 tablet, Rfl: 3 .  terbinafine (LAMISIL) 250 MG tablet, Take 1 tablet (250 mg total) by mouth daily., Disp: 90 tablet, Rfl: 1 .  valsartan (DIOVAN) 160 MG tablet, Take 1 tablet (160 mg total) by mouth daily., Disp: 90 tablet, Rfl: 3  Allergies:  Allergies  Allergen Reactions  . Metformin And Related Diarrhea    Past Medical History, Surgical history, Social history, and Family History were reviewed and updated.  Review of Systems: As above  Physical Exam:  height is 5\' 11"  (1.803 m) and weight is 191 lb (86.6 kg). His oral temperature is 97.5 F (36.4 C). His blood pressure is 148/85 (abnormal) and his pulse is 45 (abnormal). His respiration is 18.   Well-developed and well-nourished gentleman. Head and neck exam shows no ocular or oral lesions. He has no palpable cervical or supraclavicular lymph nodes. His lungs are clear with no rales, wheezes or rhonchi. Cardiac exam regular  rate and rhythm with no murmurs, rubs or bruits.. Abdomen is soft. He has good bowel sounds. There is no fluid wave. There is no palpable liver or spleen tip. Back exam shows no tenderness over the spine, ribs or hips. Extremities shows no clubbing cyanosis or edema. He has good range of motion of his joints. Has good strength in his extremities. Skin exam no rashes, ecchymoses or petechia. Neurological exam is nonfocal.  Of note, I repeated his blood pressure with a manual cuff. His blood pressure was 132/78.  Lab Results  Component Value Date   WBC 7.4 09/27/2015   HGB 13.5 09/27/2015   HCT 41.4 09/27/2015   MCV 84  09/27/2015   PLT 216 09/27/2015     Chemistry      Component Value Date/Time   NA 141 07/21/2015 1146   K 4.7 07/21/2015 1146   CL 105 06/16/2015 1011   CL 101 01/11/2015 1339   CO2 26 07/21/2015 1146   BUN 23.9 07/21/2015 1146   CREATININE 1.3 07/21/2015 1146      Component Value Date/Time   CALCIUM 9.3 07/21/2015 1146   ALKPHOS 87 07/21/2015 1146   AST 16 07/21/2015 1146   ALT 13 07/21/2015 1146   BILITOT 0.53 07/21/2015 1146         Impression and Plan: Mr. Utsey is 75 year old gentleman. He has polycythemia. We have done a good job in [place] him and keeping his blood counts adequate. He does not need to be phlebotomized today.   I think that we get him back in another 3 months.   I'm glad that his wife is doing much better.  I will I to make sure that I see him back before he and his wife head over to [place] in January.    [name], MD 9/25/20171:52 PM

## 2015-09-28 LAB — IRON AND TIBC
%SAT: 8 % — AB (ref 20–55)
IRON: 27 ug/dL — AB (ref 42–163)
TIBC: 340 ug/dL (ref 202–409)
UIBC: 313 ug/dL (ref 117–376)

## 2015-09-28 LAB — FERRITIN: FERRITIN: 16 ng/mL — AB (ref 22–316)

## 2015-10-01 ENCOUNTER — Encounter: Payer: Self-pay | Admitting: Hematology & Oncology

## 2015-10-11 ENCOUNTER — Ambulatory Visit (INDEPENDENT_AMBULATORY_CARE_PROVIDER_SITE_OTHER): Payer: Medicare HMO | Admitting: Emergency Medicine

## 2015-10-11 DIAGNOSIS — Z23 Encounter for immunization: Secondary | ICD-10-CM

## 2015-10-19 ENCOUNTER — Encounter: Payer: Self-pay | Admitting: Adult Health

## 2015-10-19 ENCOUNTER — Ambulatory Visit (INDEPENDENT_AMBULATORY_CARE_PROVIDER_SITE_OTHER): Payer: Medicare HMO | Admitting: Adult Health

## 2015-10-19 ENCOUNTER — Encounter: Payer: Self-pay | Admitting: Family Medicine

## 2015-10-19 VITALS — BP 132/80 | Temp 97.5°F | Ht 71.0 in | Wt 186.9 lb

## 2015-10-19 DIAGNOSIS — R6889 Other general symptoms and signs: Secondary | ICD-10-CM | POA: Diagnosis not present

## 2015-10-19 LAB — POCT INFLUENZA A/B
INFLUENZA B, POC: NEGATIVE
Influenza A, POC: NEGATIVE

## 2015-10-19 NOTE — Patient Instructions (Signed)
It was great meeting you today!  Your flu swab was negative  Likely this is a coincidence or you had a mild reaction to the flu shot. Stay hydrated and eat as tolerated. Your symptoms should continue to improve. If they do not, please let us know.

## 2015-10-19 NOTE — Progress Notes (Signed)
Subjective:    Patient ID: Alexander [name], male    DOB: Jan 13, 1940, 75 y.o.   MRN: [id number]  HPI  75 year old male who presents to the office today with the chief complaint of flu like symptoms. He reports that he had his flu shot on 10/11/2015. Soon after he started having fatigue, generalized body aches and was sleeping for extended periods of time.   He denies any fevers,vomiting or diarrhea. He endorses that he does have nausea and appetite change.   Today is the first day that he has started to feel like his symptoms were improving.   Review of Systems  Constitutional: Positive for activity change, appetite change and fatigue. Negative for chills, diaphoresis and fever.  HENT: Negative.   Respiratory: Negative.   Cardiovascular: Negative.   Gastrointestinal: Positive for nausea. Negative for blood in stool, constipation, diarrhea, rectal pain and vomiting.  Musculoskeletal: Positive for arthralgias and myalgias. Negative for back pain, gait problem, neck pain and neck stiffness.  Skin: Negative.   All other systems reviewed and are negative.  Past Medical History:  Diagnosis Date  . Arthritis    "minor; shoulders primarily" (05/20/2013)  . CAD (coronary artery disease)    a. 05/2013 - Chest pain/unstable angina and dynamic EKG changes showing cath showing atherosclerotic coronary artery disease manifested as diffuse ectasia, normal EF.  . Diverticulosis of colon   . GERD (gastroesophageal reflux disease)   . GI bleed    secondary to Aspirin  . Gout   . Hematuria, microscopic   . Hemorrhoids   . Hyperglycemia   . Hyperlipidemia   . Hypertension   . Lumbar back pain   . Microalbuminuria   . Overweight(278.02)   . Polycythemia rubra vera ([place])   . Pulmonary nodule    a. 05/2013 - seen on CXR.  . Tubular adenoma of colon 07/2012    Social History   Social History  . Marital status: Married    Spouse name: N/A  . Number of children: 3  . Years of education:  N/A   Occupational History  . retired Chief Financial Officer Retired   Social History Main Topics  . Smoking status: Never Smoker  . Smokeless tobacco: Never Used     Comment: NEVER USED TOBACCO  . Alcohol use No  . Drug use: No  . Sexual activity: Yes    Partners: Female   Other Topics Concern  . Not on file   Social History Narrative   Daughter, [name] (pediatrician--lives in [place], [place])    Past Surgical History:  Procedure Laterality Date  . CARDIAC CATHETERIZATION  05/20/2013  . CATARACT EXTRACTION W/ INTRAOCULAR LENS  IMPLANT, BILATERAL Bilateral 2008  . CYSTECTOMY  ~ 2000   " SEBACEOUS cyst removed from between shoulder blades"  . EXCISIONAL HEMORRHOIDECTOMY  1970's  . INGUINAL HERNIA REPAIR Right ~ 2010  . LEFT HEART CATHETERIZATION WITH CORONARY ANGIOGRAM N/A 05/20/2013   Procedure: LEFT HEART CATHETERIZATION WITH CORONARY ANGIOGRAM;  Surgeon: Peter M Martinique, MD;  Location: [facility] CATH LAB;  Service: Cardiovascular;  Laterality: N/A;  . TONSILLECTOMY AND ADENOIDECTOMY  1940's    Family History  Problem Relation Age of Onset  . Colon cancer Mother     died at 77, colorectal  . Diabetes Mother   . Heart disease Father 66    MI  . Diabetes Father   . Stomach cancer Maternal Grandfather     Allergies  Allergen Reactions  . Metformin And Related Diarrhea  Current Outpatient Prescriptions on File Prior to Visit  Medication Sig Dispense Refill  . ACCU-CHEK SOFTCLIX LANCETS lancets Test 3 times a week 100 each 5  . allopurinol (ZYLOPRIM) 100 MG tablet Take 1 tablet by mouth  daily 90 tablet 1  . ANUCORT-HC 25 MG suppository INSERT 1 SUPPOSITORY (25 MG TOTAL) RECTALLY 2 (TWO) TIMES DAILY AS NEEDED FOR HEMORRHOIDS. 120 suppository 0  . aspirin EC 81 MG tablet Take 1 tablet (81 mg total) by mouth daily. 90 tablet 3  . azelastine (ASTELIN) 0.1 % nasal spray Place 1 spray into both nostrils 2 (two) times daily. Use in each nostril as directed 30 mL 3  . Coenzyme Q10 300 MG CAPS  Take 1 capsule by mouth daily.     [name] glucose blood (ACCU-CHEK AVIVA) test strip Test 3 times a week 100 each 5  . hydrocortisone (PROCTOZONE-HC) 2.5 % rectal cream Place rectally 2 (two) times daily. 60 g 3  . pantoprazole (PROTONIX) 40 MG tablet Take 1 tablet by mouth  daily 90 tablet 1  . pravastatin (PRAVACHOL) 40 MG tablet Take 1 tablet (40 mg total) by mouth daily. 90 tablet 3  . terbinafine (LAMISIL) 250 MG tablet Take 1 tablet (250 mg total) by mouth daily. 90 tablet 1  . valsartan (DIOVAN) 160 MG tablet Take 1 tablet (160 mg total) by mouth daily. 90 tablet 3   No current facility-administered medications on file prior to visit.     BP 132/80   Temp 97.5 F (36.4 C) (Oral)   Ht 5\' 11"  (1.803 m)   Wt 186 lb 14.4 oz (84.8 kg)   BMI 26.07 kg/m       Objective:   Physical Exam  Constitutional: He is oriented to person, place, and time. He appears well-developed and well-nourished. No distress.  Neck: Normal range of motion. Neck supple. No thyromegaly present.  Cardiovascular: Normal rate, regular rhythm, normal heart sounds and intact distal pulses.  Exam reveals no gallop and no friction rub.   No murmur heard. Pulmonary/Chest: Effort normal and breath sounds normal. No respiratory distress. He has no wheezes. He has no rales. He exhibits no tenderness.  Abdominal: Soft. Bowel sounds are normal. He exhibits no distension. There is no tenderness. There is no rebound and no guarding.  Lymphadenopathy:    He has no cervical adenopathy.  Neurological: He is alert and oriented to person, place, and time.  Skin: Skin is warm and dry. No rash noted. He is not diaphoretic. No erythema. No pallor.  Psychiatric: He has a normal mood and affect. His behavior is normal. Judgment and thought content normal.  Nursing note and vitals reviewed.     Assessment & Plan:  1. Flu-like symptoms - POC Influenza A/B- Negative - Likely viral illness or mild reaction to flu vaccination  - Stay  hydrated - Follow up in 2-3 days if not resolved.   [name], NP

## 2015-10-20 ENCOUNTER — Ambulatory Visit: Payer: Medicare HMO | Admitting: Hematology & Oncology

## 2015-10-20 ENCOUNTER — Other Ambulatory Visit: Payer: Medicare HMO

## 2015-10-28 ENCOUNTER — Ambulatory Visit (INDEPENDENT_AMBULATORY_CARE_PROVIDER_SITE_OTHER): Payer: Medicare HMO | Admitting: Adult Health

## 2015-10-28 ENCOUNTER — Encounter: Payer: Self-pay | Admitting: Adult Health

## 2015-10-28 VITALS — BP 148/70 | Temp 98.2°F | Ht 71.0 in | Wt 189.5 lb

## 2015-10-28 DIAGNOSIS — I10 Essential (primary) hypertension: Secondary | ICD-10-CM | POA: Diagnosis not present

## 2015-10-28 DIAGNOSIS — Z7689 Persons encountering health services in other specified circumstances: Secondary | ICD-10-CM

## 2015-10-28 DIAGNOSIS — R7303 Prediabetes: Secondary | ICD-10-CM

## 2015-10-28 DIAGNOSIS — E785 Hyperlipidemia, unspecified: Secondary | ICD-10-CM

## 2015-10-28 LAB — POCT GLYCOSYLATED HEMOGLOBIN (HGB A1C): HEMOGLOBIN A1C: 6

## 2015-10-28 NOTE — Progress Notes (Signed)
Patient presents to clinic today to establish care. He is a pleasant 75 year old male who  has a past medical history of Arthritis; CAD (coronary artery disease); Diverticulosis of colon; GERD (gastroesophageal reflux disease); GI bleed; Gout; Hematuria, microscopic; Hemorrhoids; Hyperglycemia; Hyperlipidemia; Hypertension; Lumbar back pain; Microalbuminuria; Overweight(278.02); Polycythemia rubra vera ([place]); and Tubular adenoma of colon (07/2012).   Acute Concerns: Establish Care   Chronic Issues: Hypertension  He reports that  blood pressure has been well controlled with valsartan daily. He denies any chest pain, palpitations, SOB, headaches, or numbness or tingling. He tries to eat a low sodium diet   Elevated glucose  Monitoring blood sugars one to two times per week which are noted to be under 100 .  He remains active with swimming daily for one hour without cardiopulmonary symptoms. A1C is stable and patient reports monitoring his carbohydrate intake.  Polycythemia vera:  Sees Dr. Marin Olp every 3 months for lab work and follow up.  Last visit on 09/27/2015 he was phlebotomized and with treatment blood count has been adequate.  He is scheduled for follow up in December 2017   Health Maintenance: Dental -- Routine Vision -- Routine Immunizations --UTD  Colonoscopy -- 2014 - 5 year plan   Is Followed by:  Oncology - Blood Cancer Cardiology - [place]  Dermatology - yearly  [employer]     Past Medical History:  Diagnosis Date  . Arthritis    "minor; shoulders primarily" (05/20/2013)  . CAD (coronary artery disease)    a. 05/2013 - Chest pain/unstable angina and dynamic EKG changes showing cath showing atherosclerotic coronary artery disease manifested as diffuse ectasia, normal EF.  . Diverticulosis of colon   . GERD (gastroesophageal reflux disease)   . GI bleed    secondary to Aspirin  . Gout   . Hematuria, microscopic   . Hemorrhoids     . Hyperglycemia   . Hyperlipidemia   . Hypertension   . Lumbar back pain   . Microalbuminuria   . Overweight(278.02)   . Polycythemia rubra vera ([place])   . Pulmonary nodule    a. 05/2013 - seen on CXR.  . Tubular adenoma of colon 07/2012    Past Surgical History:  Procedure Laterality Date  . CARDIAC CATHETERIZATION  05/20/2013  . CATARACT EXTRACTION W/ INTRAOCULAR LENS  IMPLANT, BILATERAL Bilateral 2008  . CYSTECTOMY  ~ 2000   " SEBACEOUS cyst removed from between shoulder blades"  . EXCISIONAL HEMORRHOIDECTOMY  1970's  . INGUINAL HERNIA REPAIR Right ~ 2010  . LEFT HEART CATHETERIZATION WITH CORONARY ANGIOGRAM N/A 05/20/2013   Procedure: LEFT HEART CATHETERIZATION WITH CORONARY ANGIOGRAM;  Surgeon: Peter M Martinique, MD;  Location: [facility] CATH LAB;  Service: Cardiovascular;  Laterality: N/A;  . TONSILLECTOMY AND ADENOIDECTOMY  1940's    Current Outpatient Prescriptions on File Prior to Visit  Medication Sig Dispense Refill  . ACCU-CHEK SOFTCLIX LANCETS lancets Test 3 times a week 100 each 5  . allopurinol (ZYLOPRIM) 100 MG tablet Take 1 tablet by mouth  daily 90 tablet 1  . ANUCORT-HC 25 MG suppository INSERT 1 SUPPOSITORY (25 MG TOTAL) RECTALLY 2 (TWO) TIMES DAILY AS NEEDED FOR HEMORRHOIDS. 120 suppository 0  . aspirin EC 81 MG tablet Take 1 tablet (81 mg total) by mouth daily. 90 tablet 3  . azelastine (ASTELIN) 0.1 % nasal spray Place 1 spray into both nostrils 2 (two) times daily. Use in each nostril as directed 30 mL  3  . Coenzyme Q10 300 MG CAPS Take 1 capsule by mouth daily.     [name] glucose blood (ACCU-CHEK AVIVA) test strip Test 3 times a week 100 each 5  . hydrocortisone (PROCTOZONE-HC) 2.5 % rectal cream Place rectally 2 (two) times daily. 60 g 3  . pantoprazole (PROTONIX) 40 MG tablet Take 1 tablet by mouth  daily 90 tablet 1  . pravastatin (PRAVACHOL) 40 MG tablet Take 1 tablet (40 mg total) by mouth daily. 90 tablet 3  . terbinafine (LAMISIL) 250 MG tablet Take 1 tablet (250 mg  total) by mouth daily. 90 tablet 1  . valsartan (DIOVAN) 160 MG tablet Take 1 tablet (160 mg total) by mouth daily. 90 tablet 3   No current facility-administered medications on file prior to visit.     Allergies  Allergen Reactions  . Metformin And Related Diarrhea    Family History  Problem Relation Age of Onset  . Colon cancer Mother     died at 76, colorectal  . Diabetes Mother   . Heart disease Father 27    MI  . Diabetes Father   . Stomach cancer Maternal Grandfather     Social History   Social History  . Marital status: Married    Spouse name: N/A  . Number of children: 3  . Years of education: N/A   Occupational History  . retired Chief Financial Officer Retired   Social History Main Topics  . Smoking status: Never Smoker  . Smokeless tobacco: Never Used     Comment: NEVER USED TOBACCO  . Alcohol use No  . Drug use: No  . Sexual activity: Yes    Partners: Female   Other Topics Concern  . Not on file   Social History Narrative   Daughter, [name] (pediatrician--lives in [place], [place])    ROS  BP (!) 148/70   Temp 98.2 F (36.8 C) (Oral)   Ht '5\' 11"'  (1.803 m)   Wt 189 lb 8 oz (86 kg)   BMI 26.43 kg/m   Physical Exam  Recent Results (from the past 2160 hour(s))  HM DIABETES EYE EXAM     Status: None   Collection Time: 09/17/15 12:25 PM  Result Value Ref Range   HM Diabetic Eye Exam No Retinopathy No Retinopathy  CBC with Differential [facility])     Status: Abnormal   Collection Time: 09/27/15  1:05 PM  Result Value Ref Range   WBC 7.4 4.0 - 10.0 10e3/uL   RBC 4.95 4.20 - 5.70 10e6/uL   HGB 13.5 13.0 - 17.1 g/dL   HCT 41.4 38.7 - 49.9 %   MCV 84 82 - 98 fL   MCH 27.3 (L) 28.0 - 33.4 pg   MCHC 32.6 32.0 - 35.9 g/dL   RDW 16.8 (H) 11.1 - 15.7 %   Platelets 216 145 - 400 10e3/uL   NEUT# 4.2 1.5 - 6.5 10e3/uL   LYMPH# 1.8 0.9 - 3.3 10e3/uL   MONO# 1.1 (H) 0.1 - 0.9 10e3/uL   Eosinophils Absolute 0.2 0.0 - 0.5 10e3/uL   BASO# 0.0 0.0 - 0.2  10e3/uL   NEUT% 56.3 40.0 - 80.0 %   LYMPH% 24.9 14.0 - 48.0 %   MONO% 15.3 (H) 0.0 - 13.0 %   EOS% 3.1 0.0 - 7.0 %   BASO% 0.4 0.0 - 2.0 %  Ferritin     Status: Abnormal   Collection Time: 09/27/15  1:05 PM  Result Value Ref Range   Ferritin  16 (L) 22 - 316 ng/ml  Iron and TIBC     Status: Abnormal   Collection Time: 09/27/15  1:05 PM  Result Value Ref Range   Iron 27 (L) 42 - 163 ug/dL   TIBC 340 202 - 409 ug/dL   UIBC 313 117 - 376 ug/dL   %SAT 8 (L) 20 - 55 %  Comprehensive metabolic panel     Status: Abnormal   Collection Time: 09/27/15  1:05 PM  Result Value Ref Range   Sodium 142 136 - 145 mEq/L   Potassium 4.6 3.5 - 5.1 mEq/L   Chloride 107 98 - 109 mEq/L   CO2 26 22 - 29 mEq/L   Glucose 96 70 - 140 mg/dl    Comment: Glucose reference range is for nonfasting patients. Fasting glucose reference range is 70- 100.   BUN 19.5 7.0 - 26.0 mg/dL   Creatinine 1.2 0.7 - 1.3 mg/dL   Total Bilirubin 0.40 0.20 - 1.20 mg/dL   Alkaline Phosphatase 87 40 - 150 U/L   AST 13 5 - 34 U/L   ALT 14 0 - 55 U/L   Total Protein 7.0 6.4 - 8.3 g/dL   Albumin 3.6 3.5 - 5.0 g/dL   Calcium 9.3 8.4 - 10.4 mg/dL   Anion Gap 8 3 - 11 mEq/L   EGFR 56 (L) >90 ml/min/1.73 m2    Comment: eGFR is calculated using the CKD-EPI Creatinine Equation (2009)  POC Influenza A/B     Status: None   Collection Time: 10/19/15  4:25 PM  Result Value Ref Range   Influenza A, POC Negative Negative   Influenza B, POC Negative Negative    Assessment/Plan: 1. Encounter to establish care - Follow up in June for CPE  - Follow up sooner if needed - Continue to exercise and eat a heart healthy diet  2. Hyperlipidemia, unspecified hyperlipidemia type - Controlled on current medications  3. Essential hypertension Near goal.  Will continue to monitor  No changes in medication at this tim e  4. Prediabetes - POC HgB A1c- 6.0   [name], NP

## 2015-11-05 ENCOUNTER — Encounter: Payer: Self-pay | Admitting: Cardiology

## 2015-11-05 ENCOUNTER — Ambulatory Visit (INDEPENDENT_AMBULATORY_CARE_PROVIDER_SITE_OTHER): Payer: Medicare HMO | Admitting: Cardiology

## 2015-11-05 VITALS — BP 138/78 | HR 50 | Ht 72.0 in | Wt 192.0 lb

## 2015-11-05 DIAGNOSIS — I2583 Coronary atherosclerosis due to lipid rich plaque: Secondary | ICD-10-CM | POA: Diagnosis not present

## 2015-11-05 DIAGNOSIS — I2510 Atherosclerotic heart disease of native coronary artery without angina pectoris: Secondary | ICD-10-CM

## 2015-11-05 DIAGNOSIS — D45 Polycythemia vera: Secondary | ICD-10-CM | POA: Diagnosis not present

## 2015-11-05 DIAGNOSIS — I10 Essential (primary) hypertension: Secondary | ICD-10-CM | POA: Diagnosis not present

## 2015-11-05 NOTE — Patient Instructions (Signed)

## 2015-11-05 NOTE — Progress Notes (Signed)
Cardiology Office Note   Date:  11/05/2015   ID:  Alexander [name], DOB 27-Jun-1940, MRN [id number]  PCP:  [name], NP  Cardiologist:  Dr. Candee Furbish      History of Present Illness: Alexander [name] is a 75 y.o. male with a hx of polycythemia vera, HTN, GERD, HL, CKD, prior GI bleed.  In 2015 LHC demonstrated atherosclerotic CAD manifested as diffuse ectasia but no significant obstructive disease was noted. Had 30% left main plaque.  EF was normal.  Med Rx was recommended.    Benicar changed to losartan.    Prior event monitor reassuring, no atrial fibrillation. Asymptomatic bradycardia.  Overall he is feeling great. He travels to [place] almost every year. Daughter there.  No chest pain, no syncope, no bleeding, no orthopnea. Some easy bruising noted.  Studies:  - LHC (05/20/13):  Proximal left main 30%, mildly ectatic distal left main, proximal LAD with mild ectasia, proximal circumflex with moderate ectasia, proximal, mid and distal RCA with severe ectasia, EF 55-65%.    - Echo (10/2008):  EF 60-65%, Gr 1 DD  - Nuclear (12/2012):  Normal study, EF 59%   CXR (05/20/13): IMPRESSION: The nodular opacity at the right lung base does not correspond with a nipple marker and is concerning for a pulmonary nodule. Recommend further evaluation with a nonemergent CT of the chest.   Chest CT (06/05/13): IMPRESSION: 1. No suspicious lung nodule is seen. The area questioned by chest x-ray probably represents focal thickening along the major fissure adjacent to the right middle lobe anteriorly and laterally. 2. No mediastinal or hilar adenopathy.   Recent Labs: 06/16/2015: HDL 49.30; LDL Cholesterol 99 09/27/2015: ALT 14; Creatinine 1.2; HGB 13.5; Potassium 4.6  Wt Readings from Last 3 Encounters:  11/05/15 192 lb (87.1 kg)  10/28/15 189 lb 8 oz (86 kg)  10/19/15 186 lb 14.4 oz (84.8 kg)     Past Medical History:  Diagnosis Date  . Arthritis    "minor; shoulders primarily"  (05/20/2013)  . CAD (coronary artery disease)    a. 05/2013 - Chest pain/unstable angina and dynamic EKG changes showing cath showing atherosclerotic coronary artery disease manifested as diffuse ectasia, normal EF.  . Diverticulosis of colon   . GERD (gastroesophageal reflux disease)   . GI bleed    secondary to Aspirin  . Gout   . Hematuria, microscopic   . Hemorrhoids   . Hyperglycemia   . Hyperlipidemia   . Hypertension   . Lumbar back pain   . Microalbuminuria   . Overweight(278.02)   . Polycythemia rubra vera ([place])   . Tubular adenoma of colon 07/2012    Current Outpatient Prescriptions  Medication Sig Dispense Refill  . ACCU-CHEK SOFTCLIX LANCETS lancets Test 3 times a week 100 each 5  . allopurinol (ZYLOPRIM) 100 MG tablet Take 1 tablet by mouth  daily 90 tablet 1  . ANUCORT-HC 25 MG suppository INSERT 1 SUPPOSITORY (25 MG TOTAL) RECTALLY 2 (TWO) TIMES DAILY AS NEEDED FOR HEMORRHOIDS. 120 suppository 0  . aspirin EC 81 MG tablet Take 1 tablet (81 mg total) by mouth daily. 90 tablet 3  . azelastine (ASTELIN) 0.1 % nasal spray Place 1 spray into both nostrils 2 (two) times daily. Use in each nostril as directed 30 mL 3  . Coenzyme Q10 300 MG CAPS Take 1 capsule by mouth daily.     [name] glucose blood (ACCU-CHEK AVIVA) test strip Test 3 times a week 100 each 5  .  hydrocortisone (PROCTOZONE-HC) 2.5 % rectal cream Place rectally 2 (two) times daily. 60 g 3  . pantoprazole (PROTONIX) 40 MG tablet Take 1 tablet by mouth  daily 90 tablet 1  . pravastatin (PRAVACHOL) 40 MG tablet Take 1 tablet (40 mg total) by mouth daily. 90 tablet 3  . terbinafine (LAMISIL) 250 MG tablet Take 1 tablet (250 mg total) by mouth daily. 90 tablet 1  . valsartan (DIOVAN) 160 MG tablet Take 1 tablet (160 mg total) by mouth daily. 90 tablet 3   No current facility-administered medications for this visit.     Allergies:   Metformin and related   Social History:  The patient  reports that he has never smoked.  He has never used smokeless tobacco. He reports that he does not drink alcohol or use drugs.   Family History:  The patient's family history includes Cancer in his maternal uncle; Colon cancer in his mother; Diabetes in his father and mother; Heart disease (age of onset: 15) in his father; Stomach cancer in his maternal grandfather.   ROS:  Please see the history of present illness.     Easy bruising. All other systems reviewed and negative.   PHYSICAL EXAM: VS:  BP 138/78   Pulse (!) 50   Ht 6' (1.829 m)   Wt 192 lb (87.1 kg)   BMI 26.04 kg/m  Well nourished, well developed, in no acute distress HEENT: normal Neck: no JVD Cardiac:  normal S1, S2; RRR; no murmur Lungs:  clear to auscultation bilaterally, no wheezing, rhonchi or rales Abd: soft, nontender, no hepatomegaly Ext: no edemaright wrist without hematoma or mass  Skin: warm and dry Neuro:  CNs 2-12 intact, no focal abnormalities noted  EKG:  EKG ordered today-sinus bradycardia rate 49 with nonspecific ST-T wave changes personally viewed-prior Sinus brady, HR 48, NSSTTW changes     ASSESSMENT AND PLAN:  1. CAD (coronary artery disease): I'm fine with him taking low-dose aspirin. Diffuse ectasia noted on cardiac catheterization without significant obstructive disease. Continue medical therapy with aspirin,off beta blocker-bradycardia, felt poor-I'm fine with this especially given bradycardia, statin. He has had no further chest pain. Doing well. Continue with exercise. Swimming. Consider stationary bike at home as he has during winter. His wife struggles with right knee pain. 2. HYPERTENSION: Controlled. Doing well. 3. HYPERLIPIDEMIA: Doing very well with pravastatin. 4. POLYCYTHEMIA RUBRA VERA: Continue followup with hematology. Aspirin. Easy bruising. He has not had blood removal in quite some time. 5. Pulmonary nodule: Followup chest CT without lung nodule. No further workup planned. 6. Bradycardia: He is asymptomatic. Stay  off beta blocker. Continue current therapy. 7. Disposition:  One-year follow-up.   Signed, Candee Furbish, MD  11/05/2015 10:13 AM    [address], [place], [place]  [address] Phone: [phone]; Fax: [phone]

## 2015-11-09 NOTE — Addendum Note (Signed)
Addended by: Shellia Cleverly on: 11/09/2015 10:01 AM   Modules accepted: Orders

## 2015-12-08 DIAGNOSIS — L738 Other specified follicular disorders: Secondary | ICD-10-CM | POA: Diagnosis not present

## 2015-12-08 DIAGNOSIS — Z23 Encounter for immunization: Secondary | ICD-10-CM | POA: Diagnosis not present

## 2015-12-08 DIAGNOSIS — D485 Neoplasm of uncertain behavior of skin: Secondary | ICD-10-CM | POA: Diagnosis not present

## 2015-12-08 DIAGNOSIS — B079 Viral wart, unspecified: Secondary | ICD-10-CM | POA: Diagnosis not present

## 2015-12-08 DIAGNOSIS — L820 Inflamed seborrheic keratosis: Secondary | ICD-10-CM | POA: Diagnosis not present

## 2015-12-17 ENCOUNTER — Encounter: Payer: Self-pay | Admitting: Adult Health

## 2015-12-17 ENCOUNTER — Other Ambulatory Visit: Payer: Self-pay

## 2015-12-17 MED ORDER — PANTOPRAZOLE SODIUM 40 MG PO TBEC: DELAYED_RELEASE_TABLET | ORAL | 1 refills | Status: DC

## 2015-12-21 ENCOUNTER — Ambulatory Visit (INDEPENDENT_AMBULATORY_CARE_PROVIDER_SITE_OTHER): Payer: Medicare HMO | Admitting: Podiatry

## 2015-12-21 ENCOUNTER — Encounter: Payer: Self-pay | Admitting: Podiatry

## 2015-12-21 DIAGNOSIS — B351 Tinea unguium: Secondary | ICD-10-CM | POA: Diagnosis not present

## 2015-12-21 DIAGNOSIS — M79676 Pain in unspecified toe(s): Secondary | ICD-10-CM | POA: Diagnosis not present

## 2015-12-21 MED ORDER — TERBINAFINE HCL 250 MG PO TABS: 250.0000 mg | ORAL_TABLET | Freq: Every day | ORAL | 0 refills | Status: DC

## 2015-12-22 NOTE — Progress Notes (Signed)
He presents today for chief complaint painful elongated toenails.  Objective: Nails thickened dystrophic with mycotic painful palpation.  Assessment: Painless onychomycosis.  Plan: 3 toenails 1 through 5 bilateral. Also provided him with another 30 tablets of Lamisil one to be taken every other day number follow up with him in 3 months for routine nail debridement.

## 2015-12-29 ENCOUNTER — Ambulatory Visit (HOSPITAL_BASED_OUTPATIENT_CLINIC_OR_DEPARTMENT_OTHER): Payer: Medicare HMO | Admitting: Hematology & Oncology

## 2015-12-29 ENCOUNTER — Other Ambulatory Visit (HOSPITAL_BASED_OUTPATIENT_CLINIC_OR_DEPARTMENT_OTHER): Payer: Medicare HMO

## 2015-12-29 ENCOUNTER — Ambulatory Visit (HOSPITAL_BASED_OUTPATIENT_CLINIC_OR_DEPARTMENT_OTHER): Payer: Medicare HMO

## 2015-12-29 VITALS — BP 139/79 | HR 56

## 2015-12-29 VITALS — BP 155/78 | HR 55 | Temp 98.0°F | Resp 18 | Wt 191.4 lb

## 2015-12-29 DIAGNOSIS — D751 Secondary polycythemia: Secondary | ICD-10-CM

## 2015-12-29 DIAGNOSIS — D45 Polycythemia vera: Secondary | ICD-10-CM

## 2015-12-29 LAB — COMPREHENSIVE METABOLIC PANEL
ALBUMIN: 4.1 g/dL (ref 3.5–5.0)
ALK PHOS: 98 U/L (ref 40–150)
ALT: 13 U/L (ref 0–55)
AST: 16 U/L (ref 5–34)
Anion Gap: 8 mEq/L (ref 3–11)
BUN: 20 mg/dL (ref 7.0–26.0)
CALCIUM: 9.5 mg/dL (ref 8.4–10.4)
CO2: 26 mEq/L (ref 22–29)
CREATININE: 1.3 mg/dL (ref 0.7–1.3)
Chloride: 104 mEq/L (ref 98–109)
EGFR: 51 mL/min/{1.73_m2} — ABNORMAL LOW (ref >90)
GLUCOSE: 137 mg/dL (ref 70–140)
POTASSIUM: 4.1 meq/L (ref 3.5–5.1)
SODIUM: 139 meq/L (ref 136–145)
Total Bilirubin: 0.71 mg/dL (ref 0.20–1.20)
Total Protein: 7.6 g/dL (ref 6.4–8.3)

## 2015-12-29 LAB — CBC WITH DIFFERENTIAL (CANCER CENTER ONLY)
BASO#: 0 10*3/uL (ref 0.0–0.2)
BASO%: 0.6 % (ref 0.0–2.0)
EOS ABS: 0.3 10*3/uL (ref 0.0–0.5)
EOS%: 3.6 % (ref 0.0–7.0)
HEMATOCRIT: 44.9 % (ref 38.7–49.9)
HGB: 15 g/dL (ref 13.0–17.1)
LYMPH#: 1.3 10*3/uL (ref 0.9–3.3)
LYMPH%: 18.2 % (ref 14.0–48.0)
MCH: 28.5 pg (ref 28.0–33.4)
MCHC: 33.4 g/dL (ref 32.0–35.9)
MCV: 85 fL (ref 82–98)
MONO#: 0.7 10*3/uL (ref 0.1–0.9)
MONO%: 10.4 % (ref 0.0–13.0)
NEUT#: 4.7 10*3/uL (ref 1.5–6.5)
NEUT%: 67.2 % (ref 40.0–80.0)
PLATELETS: 216 10*3/uL (ref 145–400)
RBC: 5.26 10*6/uL (ref 4.20–5.70)
RDW: 16.8 % — AB (ref 11.1–15.7)
WBC: 7 10*3/uL (ref 4.0–10.0)

## 2015-12-29 LAB — IRON AND TIBC
%SAT: 19 % — ABNORMAL LOW (ref 20–55)
Iron: 68 ug/dL (ref 42–163)
TIBC: 361 ug/dL (ref 202–409)
UIBC: 293 ug/dL (ref 117–376)

## 2015-12-29 LAB — FERRITIN: Ferritin: 23 ng/ml (ref 22–316)

## 2015-12-29 NOTE — Progress Notes (Signed)
Hematology and Oncology Follow Up Visit  Alexander [name] [id number] [birth year] 75 y.o. 12/29/2015   Principle Diagnosis:  Polycythemia vera-JAK2 negative  Current Therapy:    Phlebotomy to maintain hematocrit below 45% - 12/29/2015  Aspirin 81 mg by mouth daily     Interim History:  [name] is in for a followup. He feels really good. He has family haven't celebrating [place]. He has wife will be had no over to [place] in April actually. I think they always go over for Passover.   He is trying to exercise. In the summer he was swimming. I think he may be doing some indoor swimming.   He's had no problems with his heart. He's had no chest pain. He's had no increase in fatigue or weakness. He's had no nausea or vomiting. He's had no diaphoresis.   We last saw him in September, his iron studies did show some iron deficiency with a ferritin of 16 and iron saturation of 8.  Overall, his performance status is ECOG 1.  Medications:  Current Outpatient Prescriptions:  .  ACCU-CHEK SOFTCLIX LANCETS lancets, Test 3 times a week, Disp: 100 each, Rfl: 5 .  allopurinol (ZYLOPRIM) 100 MG tablet, Take 1 tablet by mouth  daily, Disp: 90 tablet, Rfl: 1 .  ANUCORT-HC 25 MG suppository, INSERT 1 SUPPOSITORY (25 MG TOTAL) RECTALLY 2 (TWO) TIMES DAILY AS NEEDED FOR HEMORRHOIDS., Disp: 120 suppository, Rfl: 0 .  aspirin EC 81 MG tablet, Take 1 tablet (81 mg total) by mouth daily., Disp: 90 tablet, Rfl: 3 .  azelastine (ASTELIN) 0.1 % nasal spray, Place 1 spray into both nostrils 2 (two) times daily. Use in each nostril as directed, Disp: 30 mL, Rfl: 3 .  Coenzyme Q10 300 MG CAPS, Take 1 capsule by mouth daily. , Disp: , Rfl:  .  glucose blood (ACCU-CHEK AVIVA) test strip, Test 3 times a week, Disp: 100 each, Rfl: 5 .  hydrocortisone (PROCTOZONE-HC) 2.5 % rectal cream, Place rectally 2 (two) times daily., Disp: 60 g, Rfl: 3 .  pantoprazole (PROTONIX) 40 MG tablet, Take 1 tablet by mouth  daily,  Disp: 90 tablet, Rfl: 1 .  pravastatin (PRAVACHOL) 40 MG tablet, Take 1 tablet (40 mg total) by mouth daily., Disp: 90 tablet, Rfl: 3 .  terbinafine (LAMISIL) 250 MG tablet, Take 1 tablet (250 mg total) by mouth daily., Disp: 30 tablet, Rfl: 0 .  valsartan (DIOVAN) 160 MG tablet, Take 1 tablet (160 mg total) by mouth daily., Disp: 90 tablet, Rfl: 3  Allergies:  Allergies  Allergen Reactions  . Metformin And Related Diarrhea    Past Medical History, Surgical history, Social history, and Family History were reviewed and updated.  Review of Systems: As above  Physical Exam:  weight is 191 lb 6.4 oz (86.8 kg). His oral temperature is 98 F (36.7 C). His blood pressure is 155/78 (abnormal) and his pulse is 55 (abnormal). His respiration is 18.   Well-developed and well-nourished gentleman. Head and neck exam shows no ocular or oral lesions. He has no palpable cervical or supraclavicular lymph nodes. His lungs are clear with no rales, wheezes or rhonchi. Cardiac exam regular rate and rhythm with no murmurs, rubs or bruits.. Abdomen is soft. He has good bowel sounds. There is no fluid wave. There is no palpable liver or spleen tip. Back exam shows no tenderness over the spine, ribs or hips. Extremities shows no clubbing cyanosis or edema. He has good range of motion of his  joints. Has good strength in his extremities. Skin exam no rashes, ecchymoses or petechia. Neurological exam is nonfocal.  Of note, I repeated his blood pressure with a manual cuff. His blood pressure was 132/78.  Lab Results  Component Value Date   WBC 7.0 12/29/2015   HGB 15.0 12/29/2015   HCT 44.9 12/29/2015   MCV 85 12/29/2015   PLT 216 12/29/2015     Chemistry      Component Value Date/Time   NA 139 12/29/2015 0954   K 4.1 12/29/2015 0954   CL 105 06/16/2015 1011   CL 101 01/11/2015 1339   CO2 26 12/29/2015 0954   BUN 20.0 12/29/2015 0954   CREATININE 1.3 12/29/2015 0954      Component Value Date/Time    CALCIUM 9.5 12/29/2015 0954   ALKPHOS 98 12/29/2015 0954   AST 16 12/29/2015 0954   ALT 13 12/29/2015 0954   BILITOT 0.71 12/29/2015 0954     His ferritin is 23 with an iron saturation of 19%.  Impression and Plan: Alexander [name] is 75 year old gentleman. He has polycythemia. We have done a good job in [name] him and keeping his blood counts adequate.  We will go ahead and phlebotomize him today. He is close enough to 45% with his hematocrit that I think he should be phlebotomized.  As always, he and his wife we had over to [place]. I think they're going next month.  I think we get him back to see [place] in 3 more months.   [name], MD 12/27/20175:47 PM

## 2015-12-29 NOTE — Progress Notes (Signed)
Alexander [name] presents today for phlebotomy per MD orders. Phlebotomy procedure started at 1115 and ended at 1127. 500 grams removed. Patient observed for 30 minutes after procedure without any incident. Patient tolerated procedure well. IV needle removed intact.

## 2015-12-29 NOTE — Patient Instructions (Signed)
     Therapeutic Phlebotomy, Care After Refer to this sheet in the next few weeks. These instructions provide you with information about caring for yourself after your procedure. Your health care provider may also give you more specific instructions. Your treatment has been planned according to current medical practices, but problems sometimes occur. Call your health care provider if you have any problems or questions after your procedure. What can I expect after the procedure? After the procedure, it is common to have:  Light-headedness or dizziness. You may feel faint.  Nausea.  Tiredness. Follow these instructions at home: Activity  Return to your normal activities as directed by your health care provider. Most people can go back to their normal activities right away.  Avoid strenuous physical activity and heavy lifting or pulling for about 5 hours after the procedure. Do not lift anything that is heavier than 10 lb (4.5 kg).  Athletes should avoid strenuous exercise for at least 12 hours.  Change positions slowly for the remainder of the day. This will help to prevent light-headedness or fainting.  If you feel light-headed, lie down until the feeling goes away. Eating and drinking  Be sure to eat well-balanced meals for the next 24 hours.  Drink enough fluid to keep your urine clear or pale yellow.  Avoid drinking alcohol on the day that you had the procedure. Care of the Needle Insertion Site  Keep your bandage dry. You can remove the bandage after about 5 hours or as directed by your health care provider.  If you have bleeding from the needle insertion site, elevate your arm and press firmly on the site until the bleeding stops.  If you have bruising at the site, apply ice to the area:  Put ice in a plastic bag.  Place a towel between your skin and the bag.  Leave the ice on for 20 minutes, 2-3 times a day for the first 24 hours.  If the swelling does not go away  after 24 hours, apply a warm, moist washcloth to the area for 20 minutes, 2-3 times a day. General instructions  Avoid smoking for at least 30 minutes after the procedure.  Keep all follow-up visits as directed by your health care provider. It is important to continue with further therapeutic phlebotomy treatments as directed. Contact a health care provider if:  You have redness, swelling, or pain at the needle insertion site.  You have fluid, blood, or pus coming from the needle insertion site.  You feel light-headed, dizzy, or nauseated, and the feeling does not go away.  You notice new bruising at the needle insertion site.  You feel weaker than normal.  You have a fever or chills. Get help right away if:  You have severe nausea or vomiting.  You have chest pain.  You have trouble breathing. This information is not intended to replace advice given to you by your health care provider. Make sure you discuss any questions you have with your health care provider. Document Released: 05/23/2010 Document Revised: 08/21/2015 Document Reviewed: 12/15/2013 Elsevier Interactive Patient Education  2017 Elsevier Inc.  

## 2016-01-07 ENCOUNTER — Encounter: Payer: Self-pay | Admitting: Adult Health

## 2016-01-07 ENCOUNTER — Other Ambulatory Visit: Payer: Self-pay

## 2016-01-07 MED ORDER — HYDROCORTISONE ACETATE 25 MG RE SUPP: RECTAL | 0 refills | Status: DC

## 2016-01-26 ENCOUNTER — Encounter: Payer: Self-pay | Admitting: Adult Health

## 2016-02-09 ENCOUNTER — Encounter: Payer: Self-pay | Admitting: Adult Health

## 2016-02-09 ENCOUNTER — Ambulatory Visit (INDEPENDENT_AMBULATORY_CARE_PROVIDER_SITE_OTHER): Payer: Medicare HMO | Admitting: Adult Health

## 2016-02-09 ENCOUNTER — Ambulatory Visit: Payer: Medicare HMO | Admitting: Adult Health

## 2016-02-09 VITALS — BP 126/72 | Temp 97.6°F | Ht 72.0 in | Wt 196.8 lb

## 2016-02-09 DIAGNOSIS — R197 Diarrhea, unspecified: Secondary | ICD-10-CM | POA: Diagnosis not present

## 2016-02-09 NOTE — Progress Notes (Signed)
Subjective:    Patient ID: Alexander [name], male    DOB: [birth year], 76 y.o.   MRN: [id number]  Reports that his symptoms have started to improve. He has decreased frequency and his stool is becoming more solid    Diarrhea   This is a new problem. The current episode started in the past 7 days (3 days). The problem has been gradually improving. The stool consistency is described as watery. The patient states that diarrhea does not awaken him from sleep. Pertinent negatives include no abdominal pain, bloating, chills, fever, myalgias or vomiting. Nothing aggravates the symptoms. Risk factors include ill contacts (wife has same symptoms ). He has tried increased fluids for the symptoms. The treatment provided mild relief.    76 year old male who  has a past medical history of Arthritis; CAD (coronary artery disease); Diverticulosis of colon; GERD (gastroesophageal reflux disease); GI bleed; Gout; Hematuria, microscopic; Hemorrhoids; Hyperglycemia; Hyperlipidemia; Hypertension; Lumbar back pain; Microalbuminuria; Overweight(278.02); Polycythemia rubra vera ([place]); and Tubular adenoma of colon (07/2012).     Review of Systems  Constitutional: Positive for activity change and fatigue. Negative for appetite change, chills and fever.  Cardiovascular: Negative.   Gastrointestinal: Positive for diarrhea. Negative for abdominal distention, abdominal pain, anal bleeding, bloating, blood in stool, constipation, nausea, rectal pain and vomiting.  Musculoskeletal: Negative for myalgias.   Past Medical History:  Diagnosis Date  . Arthritis    "minor; shoulders primarily" (05/20/2013)  . CAD (coronary artery disease)    a. 05/2013 - Chest pain/unstable angina and dynamic EKG changes showing cath showing atherosclerotic coronary artery disease manifested as diffuse ectasia, normal EF.  . Diverticulosis of colon   . GERD (gastroesophageal reflux disease)   . GI bleed    secondary to Aspirin  . Gout     . Hematuria, microscopic   . Hemorrhoids   . Hyperglycemia   . Hyperlipidemia   . Hypertension   . Lumbar back pain   . Microalbuminuria   . Overweight(278.02)   . Polycythemia rubra vera ([place])   . Tubular adenoma of colon 07/2012    Social History   Social History  . Marital status: Married    Spouse name: N/A  . Number of children: 3  . Years of education: N/A   Occupational History  . retired Chief Financial Officer Retired   Social History Main Topics  . Smoking status: Never Smoker  . Smokeless tobacco: Never Used     Comment: NEVER USED TOBACCO  . Alcohol use No  . Drug use: No  . Sexual activity: Yes    Partners: Female   Other Topics Concern  . Not on file   Social History Narrative   Marriedfor 78 years    Daughter, [name] (pediatrician--lives in [place], [place]), one in [place] - Engineer, water. One in [place] - Chief Strategy Officer              Past Surgical History:  Procedure Laterality Date  . CARDIAC CATHETERIZATION  05/20/2013  . CATARACT EXTRACTION W/ INTRAOCULAR LENS  IMPLANT, BILATERAL Bilateral 2008  . CYSTECTOMY  ~ 2000   " SEBACEOUS cyst removed from between shoulder blades"  . EXCISIONAL HEMORRHOIDECTOMY  1970's  . INGUINAL HERNIA REPAIR Right ~ 2010  . LEFT HEART CATHETERIZATION WITH CORONARY ANGIOGRAM N/A 05/20/2013   Procedure: LEFT HEART CATHETERIZATION WITH CORONARY ANGIOGRAM;  Surgeon: Peter M Martinique, MD;  Location: [facility] CATH LAB;  Service: Cardiovascular;  Laterality: N/A;  . TONSILLECTOMY AND ADENOIDECTOMY  1940's  . VASECTOMY  Family History  Problem Relation Age of Onset  . Colon cancer Mother     died at 68, colorectal  . Diabetes Mother   . Heart disease Father 36    MI  . Diabetes Father   . Stomach cancer Maternal Grandfather   . Cancer Maternal Uncle     Allergies  Allergen Reactions  . Metformin And Related Diarrhea    Current Outpatient Prescriptions on File Prior to Visit  Medication Sig Dispense Refill  . ACCU-CHEK SOFTCLIX LANCETS  lancets Test 3 times a week 100 each 5  . allopurinol (ZYLOPRIM) 100 MG tablet Take 1 tablet by mouth  daily 90 tablet 1  . aspirin EC 81 MG tablet Take 1 tablet (81 mg total) by mouth daily. 90 tablet 3  . azelastine (ASTELIN) 0.1 % nasal spray Place 1 spray into both nostrils 2 (two) times daily. Use in each nostril as directed 30 mL 3  . Coenzyme Q10 300 MG CAPS Take 1 capsule by mouth daily.     [name] glucose blood (ACCU-CHEK AVIVA) test strip Test 3 times a week 100 each 5  . hydrocortisone (ANUCORT-HC) 25 MG suppository Take as directed 120 suppository 0  . hydrocortisone (PROCTOZONE-HC) 2.5 % rectal cream Place rectally 2 (two) times daily. 60 g 3  . pantoprazole (PROTONIX) 40 MG tablet Take 1 tablet by mouth  daily 90 tablet 1  . pravastatin (PRAVACHOL) 40 MG tablet Take 1 tablet (40 mg total) by mouth daily. 90 tablet 3  . terbinafine (LAMISIL) 250 MG tablet Take 1 tablet (250 mg total) by mouth daily. 30 tablet 0  . valsartan (DIOVAN) 160 MG tablet Take 1 tablet (160 mg total) by mouth daily. 90 tablet 3   No current facility-administered medications on file prior to visit.     BP 126/72   Temp 97.6 F (36.4 C) (Oral)   Ht 6' (1.829 m)   Wt 196 lb 12.8 oz (89.3 kg)   BMI 26.69 kg/m       Objective:   Physical Exam  Constitutional: He is oriented to person, place, and time. He appears well-developed and well-nourished. No distress.  Cardiovascular: Normal rate, regular rhythm, normal heart sounds and intact distal pulses.  Exam reveals no gallop and no friction rub.   No murmur heard. Pulmonary/Chest: Effort normal and breath sounds normal. No respiratory distress. He has no wheezes. He has no rales. He exhibits no tenderness.  Abdominal: Soft. Bowel sounds are normal. He exhibits no distension and no mass. There is no tenderness. There is no rebound and no guarding.  Neurological: He is alert and oriented to person, place, and time.  Skin: Skin is warm and dry. No rash noted.  He is not diaphoretic. No erythema. No pallor.  Psychiatric: He has a normal mood and affect. His behavior is normal. Judgment and thought content normal.  Nursing note and vitals reviewed.     Assessment & Plan:  1. Diarrhea, unspecified type - Pepto Bismol - BRAT diet - stay hydrated - Follow up if no improvement  [name], NP

## 2016-02-17 ENCOUNTER — Telehealth: Payer: Self-pay | Admitting: Adult Health

## 2016-02-17 NOTE — Telephone Encounter (Signed)
Added to paperwork to be faxed

## 2016-02-17 NOTE — Telephone Encounter (Signed)
Please advise on dosing instructions

## 2016-02-17 NOTE — Telephone Encounter (Signed)
[facility] mail order needs the dosing instructions for ANUCORT-HC 25 MG suppository   They say it was a hand written script, dates 02/09/2016.  Call back number: [id number]  Ref no:  [id number]  Ok to leave dosing instructions on voice mail

## 2016-02-24 ENCOUNTER — Other Ambulatory Visit: Payer: Self-pay | Admitting: Family Medicine

## 2016-03-09 DIAGNOSIS — H16293 Other keratoconjunctivitis, bilateral: Secondary | ICD-10-CM | POA: Diagnosis not present

## 2016-03-14 ENCOUNTER — Other Ambulatory Visit: Payer: Self-pay

## 2016-03-14 MED ORDER — PRAVASTATIN SODIUM 40 MG PO TABS: 40.0000 mg | ORAL_TABLET | Freq: Every day | ORAL | 3 refills | Status: DC

## 2016-03-14 MED ORDER — PANTOPRAZOLE SODIUM 40 MG PO TBEC: DELAYED_RELEASE_TABLET | ORAL | 1 refills | Status: DC

## 2016-03-21 ENCOUNTER — Ambulatory Visit (INDEPENDENT_AMBULATORY_CARE_PROVIDER_SITE_OTHER): Payer: Medicare HMO | Admitting: Podiatry

## 2016-03-21 DIAGNOSIS — B351 Tinea unguium: Secondary | ICD-10-CM

## 2016-03-21 DIAGNOSIS — M79676 Pain in unspecified toe(s): Principal | ICD-10-CM

## 2016-03-21 DIAGNOSIS — M79674 Pain in right toe(s): Secondary | ICD-10-CM

## 2016-03-21 DIAGNOSIS — M79675 Pain in left toe(s): Secondary | ICD-10-CM

## 2016-03-21 NOTE — Progress Notes (Signed)
He presents today with chief complaint of painful elongated toenails 1 through 5 bilateral.  Objective: Vital signs are stable alert and oriented 3. Pulses are palpable. Neurologic symptoms was intact. Degenerative flexors are intact. Muscle strength is normal. Cutaneous evaluation Mr. supple well-hydrated cutis no erythema edema cellulitis drainage or odor he does have thick yellow dystrophic onychomycotic nails and mild tinea pedis.  Assessment: Pain left second and onychomycosis.  Plan: Continue to take the Lamisil every other day. Debrided toenails 1 through 5 bilateral.

## 2016-03-28 DIAGNOSIS — H1013 Acute atopic conjunctivitis, bilateral: Secondary | ICD-10-CM | POA: Diagnosis not present

## 2016-03-29 ENCOUNTER — Ambulatory Visit (HOSPITAL_BASED_OUTPATIENT_CLINIC_OR_DEPARTMENT_OTHER): Payer: Medicare HMO | Admitting: Hematology & Oncology

## 2016-03-29 ENCOUNTER — Other Ambulatory Visit (HOSPITAL_BASED_OUTPATIENT_CLINIC_OR_DEPARTMENT_OTHER): Payer: Medicare HMO

## 2016-03-29 VITALS — BP 137/70 | HR 47 | Temp 98.4°F | Resp 16 | Wt 191.0 lb

## 2016-03-29 DIAGNOSIS — D45 Polycythemia vera: Secondary | ICD-10-CM | POA: Diagnosis not present

## 2016-03-29 LAB — CBC WITH DIFFERENTIAL (CANCER CENTER ONLY)
BASO#: 0.1 10*3/uL (ref 0.0–0.2)
BASO%: 0.6 % (ref 0.0–2.0)
EOS ABS: 0.4 10*3/uL (ref 0.0–0.5)
EOS%: 4 % (ref 0.0–7.0)
HCT: 41.2 % (ref 38.7–49.9)
HEMOGLOBIN: 13.3 g/dL (ref 13.0–17.1)
LYMPH#: 2 10*3/uL (ref 0.9–3.3)
LYMPH%: 22.2 % (ref 14.0–48.0)
MCH: 27.9 pg — AB (ref 28.0–33.4)
MCHC: 32.3 g/dL (ref 32.0–35.9)
MCV: 86 fL (ref 82–98)
MONO#: 1.4 10*3/uL — ABNORMAL HIGH (ref 0.1–0.9)
MONO%: 15.2 % — AB (ref 0.0–13.0)
NEUT#: 5.2 10*3/uL (ref 1.5–6.5)
NEUT%: 58 % (ref 40.0–80.0)
Platelets: 206 10*3/uL (ref 145–400)
RBC: 4.77 10*6/uL (ref 4.20–5.70)
RDW: 15 % (ref 11.1–15.7)
WBC: 8.9 10*3/uL (ref 4.0–10.0)

## 2016-03-29 LAB — COMPREHENSIVE METABOLIC PANEL (CC13)
ALBUMIN: 4.2 g/dL (ref 3.5–4.8)
ALK PHOS: 94 IU/L (ref 39–117)
ALT: 14 IU/L (ref 0–44)
AST: 16 IU/L (ref 0–40)
Albumin/Globulin Ratio: 1.4 (ref 1.2–2.2)
BILIRUBIN TOTAL: 0.4 mg/dL (ref 0.0–1.2)
BUN / CREAT RATIO: 19 (ref 10–24)
BUN: 21 mg/dL (ref 8–27)
Calcium, Ser: 8.9 mg/dL (ref 8.6–10.2)
Carbon Dioxide, Total: 28 mmol/L (ref 18–29)
Chloride, Ser: 103 mmol/L (ref 96–106)
Creatinine, Ser: 1.13 mg/dL (ref 0.76–1.27)
GFR calc Af Amer: 73 mL/min/{1.73_m2} (ref >59)
GFR calc non Af Amer: 63 mL/min/{1.73_m2} (ref >59)
GLUCOSE: 122 mg/dL — AB (ref 65–99)
Globulin, Total: 2.9 g/dL (ref 1.5–4.5)
Potassium, Ser: 4.5 mmol/L (ref 3.5–5.2)
Sodium: 137 mmol/L (ref 134–144)
Total Protein: 7.1 g/dL (ref 6.0–8.5)
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
## 2016-03-30 LAB — IRON AND TIBC
%SAT: 8 % — ABNORMAL LOW (ref 20–55)
IRON: 31 ug/dL — AB (ref 42–163)
TIBC: 376 ug/dL (ref 202–409)
UIBC: 345 ug/dL (ref 117–376)

## 2016-03-30 LAB — FERRITIN: FERRITIN: 18 ng/mL — AB (ref 22–316)

## 2016-04-14 ENCOUNTER — Encounter: Payer: Self-pay | Admitting: Adult Health

## 2016-04-14 MED ORDER — VALSARTAN 160 MG PO TABS: 160.0000 mg | ORAL_TABLET | Freq: Every day | ORAL | 3 refills | Status: DC

## 2016-04-17 DIAGNOSIS — L918 Other hypertrophic disorders of the skin: Secondary | ICD-10-CM | POA: Diagnosis not present

## 2016-04-17 DIAGNOSIS — L309 Dermatitis, unspecified: Secondary | ICD-10-CM | POA: Diagnosis not present

## 2016-04-17 DIAGNOSIS — L603 Nail dystrophy: Secondary | ICD-10-CM | POA: Diagnosis not present

## 2016-05-14 ENCOUNTER — Encounter: Payer: Self-pay | Admitting: Adult Health

## 2016-05-19 ENCOUNTER — Encounter: Payer: Self-pay | Admitting: Adult Health

## 2016-06-15 ENCOUNTER — Telehealth: Payer: Self-pay | Admitting: Adult Health

## 2016-06-15 NOTE — Telephone Encounter (Signed)
Pt would like to know if he needs labs prior to his visit on 07/07/16

## 2016-06-16 DIAGNOSIS — E119 Type 2 diabetes mellitus without complications: Secondary | ICD-10-CM | POA: Diagnosis not present

## 2016-06-16 DIAGNOSIS — H35342 Macular cyst, hole, or pseudohole, left eye: Secondary | ICD-10-CM | POA: Diagnosis not present

## 2016-06-16 NOTE — Telephone Encounter (Signed)
I contacted patient and patient's wife to let them know that our new policy is that we can only draw labs after cpe visit, incase there are any additional labs that need to added on. Patient's wife then got upset and stated that they could not fast for that long. I advised that we could reschedule for a morning appt, that way it would be more convenient and patient stated "no, we are not waking up early to come in for an appt." I advised patient that we will need fasting labs for their appt's - and I was sorry for any inconvenience. Patient's wife stated that she would like to get patient scheduled sooner since he is having "a ton of issues that need to be discussed asap" and I advised patient that we can definitely schedule him sooner, but it Tommi Rumps may be unable to complete a physical if their is multiple acute issues going on. Patient's wife then got very loud and stated "will you just hurry up and schedule him and appt"  I was able to get appt scheduled 06/22/16 with Tommi Rumps. Patient's wife then asked if I could schedule her an appt and when I asked her to verify her birthday so I could get her pulled up in the system, she then stated "just forget it, I have to go."

## 2016-06-20 ENCOUNTER — Ambulatory Visit: Payer: Medicare HMO | Admitting: Podiatry

## 2016-06-22 ENCOUNTER — Ambulatory Visit (INDEPENDENT_AMBULATORY_CARE_PROVIDER_SITE_OTHER): Payer: Medicare HMO | Admitting: Adult Health

## 2016-06-22 ENCOUNTER — Encounter: Payer: Self-pay | Admitting: Adult Health

## 2016-06-22 VITALS — BP 124/76 | Temp 97.7°F | Ht 72.0 in | Wt 185.8 lb

## 2016-06-22 DIAGNOSIS — Z760 Encounter for issue of repeat prescription: Secondary | ICD-10-CM | POA: Diagnosis not present

## 2016-06-22 DIAGNOSIS — B351 Tinea unguium: Secondary | ICD-10-CM | POA: Diagnosis not present

## 2016-06-22 DIAGNOSIS — R238 Other skin changes: Secondary | ICD-10-CM

## 2016-06-22 MED ORDER — HYDROCORTISONE 2.5 % RE CREA: TOPICAL_CREAM | Freq: Two times a day (BID) | RECTAL | 3 refills | Status: DC

## 2016-06-22 MED ORDER — TRIAMCINOLONE ACETONIDE 55 MCG/ACT NA AERO: 2.0000 | INHALATION_SPRAY | Freq: Every day | NASAL | 12 refills | Status: DC

## 2016-06-22 MED ORDER — TERBINAFINE HCL 250 MG PO TABS: 250.0000 mg | ORAL_TABLET | Freq: Every day | ORAL | 0 refills | Status: DC

## 2016-06-22 NOTE — Progress Notes (Signed)
Subjective:    Patient ID: Alexander [name], male    DOB: 1940/03/31, 76 y.o.   MRN: [id number]  HPI  76 year old male who  has a past medical history of Arthritis; CAD (coronary artery disease); Diverticulosis of colon; GERD (gastroesophageal reflux disease); GI bleed; Gout; Hematuria, microscopic; Hemorrhoids; Hyperglycemia; Hyperlipidemia; Hypertension; Lumbar back pain; Microalbuminuria; Overweight(278.02); Polycythemia rubra vera ([place]); and Tubular adenoma of colon (07/2012).  He presents to the office today for several acute issues.   1.itchiness on back - he has been seen by dermatology and was prescribed a steroid cream which he reports works for a short duration of time. He denies any rash or bug bites.   2. Toe Nail fungus - Has been on oral lamisil in the past and it works well but the toe nail fungus returns after 3-6 months    3. He needs medication refills of protosol cream and Triamcinolone nasal spray    Review of Systems See HPI   Past Medical History:  Diagnosis Date  . Arthritis    "minor; shoulders primarily" (05/20/2013)  . CAD (coronary artery disease)    a. 05/2013 - Chest pain/unstable angina and dynamic EKG changes showing cath showing atherosclerotic coronary artery disease manifested as diffuse ectasia, normal EF.  . Diverticulosis of colon   . GERD (gastroesophageal reflux disease)   . GI bleed    secondary to Aspirin  . Gout   . Hematuria, microscopic   . Hemorrhoids   . Hyperglycemia   . Hyperlipidemia   . Hypertension   . Lumbar back pain   . Microalbuminuria   . Overweight(278.02)   . Polycythemia rubra vera ([place])   . Tubular adenoma of colon 07/2012    Social History   Social History  . Marital status: Married    Spouse name: N/A  . Number of children: 3  . Years of education: N/A   Occupational History  . retired Chief Financial Officer Retired   Social History Main Topics  . Smoking status: Never Smoker  . Smokeless tobacco: Never Used   Comment: NEVER USED TOBACCO  . Alcohol use No  . Drug use: No  . Sexual activity: Yes    Partners: Female   Other Topics Concern  . Not on file   Social History Narrative   Marriedfor 22 years    Daughter, [name] (pediatrician--lives in [place], [place]), one in [place] - Engineer, water. One in [place] - Chief Strategy Officer              Past Surgical History:  Procedure Laterality Date  . CARDIAC CATHETERIZATION  05/20/2013  . CATARACT EXTRACTION W/ INTRAOCULAR LENS  IMPLANT, BILATERAL Bilateral 2008  . CYSTECTOMY  ~ 2000   " SEBACEOUS cyst removed from between shoulder blades"  . EXCISIONAL HEMORRHOIDECTOMY  1970's  . INGUINAL HERNIA REPAIR Right ~ 2010  . LEFT HEART CATHETERIZATION WITH CORONARY ANGIOGRAM N/A 05/20/2013   Procedure: LEFT HEART CATHETERIZATION WITH CORONARY ANGIOGRAM;  Surgeon: Peter M Martinique, MD;  Location: [facility] CATH LAB;  Service: Cardiovascular;  Laterality: N/A;  . TONSILLECTOMY AND ADENOIDECTOMY  1940's  . VASECTOMY      Family History  Problem Relation Age of Onset  . Colon cancer Mother        died at 70, colorectal  . Diabetes Mother   . Heart disease Father 66       MI  . Diabetes Father   . Stomach cancer Maternal Grandfather   . Cancer Maternal Uncle  Allergies  Allergen Reactions  . Metformin And Related Diarrhea    Current Outpatient Prescriptions on File Prior to Visit  Medication Sig Dispense Refill  . ACCU-CHEK SOFTCLIX LANCETS lancets Test 3 times a week 100 each 5  . allopurinol (ZYLOPRIM) 100 MG tablet TAKE ONE TABLET BY MOUTH ONCE DAILY 90 tablet 0  . aspirin EC 81 MG tablet Take 1 tablet (81 mg total) by mouth daily. 90 tablet 3  . azelastine (ASTELIN) 0.1 % nasal spray Place 1 spray into both nostrils 2 (two) times daily. Use in each nostril as directed 30 mL 3  . Coenzyme Q10 300 MG CAPS Take 1 capsule by mouth daily.     [name] glucose blood (ACCU-CHEK AVIVA) test strip Test 3 times a week 100 each 5  . hydrocortisone (ANUCORT-HC) 25 MG suppository  Take as directed 120 suppository 0  . hydrocortisone (PROCTOZONE-HC) 2.5 % rectal cream Place rectally 2 (two) times daily. 60 g 3  . pantoprazole (PROTONIX) 40 MG tablet Take 1 tablet by mouth  daily 90 tablet 1  . pravastatin (PRAVACHOL) 40 MG tablet Take 1 tablet (40 mg total) by mouth daily. 90 tablet 3  . terbinafine (LAMISIL) 250 MG tablet Take 1 tablet (250 mg total) by mouth daily. 30 tablet 0  . valsartan (DIOVAN) 160 MG tablet Take 1 tablet (160 mg total) by mouth daily. 90 tablet 3   No current facility-administered medications on file prior to visit.     BP 124/76   Temp 97.7 F (36.5 C) (Oral)   Ht 6' (1.829 m)   Wt 185 lb 12.8 oz (84.3 kg)   BMI 25.20 kg/m       Objective:   Physical Exam  Constitutional: He is oriented to person, place, and time. He appears well-developed and well-nourished. No distress.  Cardiovascular: Normal rate, regular rhythm, normal heart sounds and intact distal pulses.  Exam reveals no gallop and no friction rub.   No murmur heard. Pulmonary/Chest: Effort normal and breath sounds normal. No respiratory distress. He has no wheezes. He has no rales. He exhibits no tenderness.  Neurological: He is alert and oriented to person, place, and time.  Skin: Skin is warm and dry. No rash noted. He is not diaphoretic. No erythema. No pallor.  No dry skin, rash or eczema on back  Toenail fungus noted on multiple toes  Nursing note and vitals reviewed.     Assessment & Plan:  1. Skin irritation - Unknown cause. Advised to try topical benadryl cream  2. Toenail fungus  - terbinafine (LAMISIL) 250 MG tablet; Take 1 tablet (250 mg total) by mouth daily.  Dispense: 90 tablet; Refill: 0  3. Medication refill  - triamcinolone (NASACORT) 55 MCG/ACT AERO nasal inhaler; Place 2 sprays into the nose daily.  Dispense: 1 Inhaler; Refill: 12 - hydrocortisone (PROCTOZONE-HC) 2.5 % rectal cream; Place rectally 2 (two) times daily.  Dispense: 60 g; Refill: 3 -  terbinafine (LAMISIL) 250 MG tablet; Take 1 tablet (250 mg total) by mouth daily.  Dispense: 90 tablet; Refill: 0   [name], NP

## 2016-06-23 DIAGNOSIS — H538 Other visual disturbances: Secondary | ICD-10-CM | POA: Diagnosis not present

## 2016-06-23 DIAGNOSIS — H353131 Nonexudative age-related macular degeneration, bilateral, early dry stage: Secondary | ICD-10-CM | POA: Diagnosis not present

## 2016-06-28 ENCOUNTER — Ambulatory Visit (HOSPITAL_BASED_OUTPATIENT_CLINIC_OR_DEPARTMENT_OTHER): Payer: Medicare HMO | Admitting: Hematology & Oncology

## 2016-06-28 ENCOUNTER — Other Ambulatory Visit (HOSPITAL_BASED_OUTPATIENT_CLINIC_OR_DEPARTMENT_OTHER): Payer: Medicare HMO

## 2016-06-28 VITALS — BP 146/81 | HR 59 | Temp 100.0°F | Resp 18 | Wt 188.0 lb

## 2016-06-28 DIAGNOSIS — D45 Polycythemia vera: Secondary | ICD-10-CM

## 2016-06-28 DIAGNOSIS — H532 Diplopia: Secondary | ICD-10-CM | POA: Diagnosis not present

## 2016-06-28 LAB — CMP (CANCER CENTER ONLY)
ALT(SGPT): 23 U/L (ref 10–47)
AST: 20 U/L (ref 11–38)
Albumin: 3.5 g/dL (ref 3.3–5.5)
Alkaline Phosphatase: 68 U/L (ref 26–84)
BUN: 22 mg/dL (ref 7–22)
CHLORIDE: 105 meq/L (ref 98–108)
CO2: 28 meq/L (ref 18–33)
Calcium: 9.1 mg/dL (ref 8.0–10.3)
Creat: 1.2 mg/dl (ref 0.6–1.2)
GLUCOSE: 109 mg/dL (ref 73–118)
Potassium: 4.6 mEq/L (ref 3.3–4.7)
SODIUM: 140 meq/L (ref 128–145)
TOTAL PROTEIN: 6.7 g/dL (ref 6.4–8.1)
Total Bilirubin: 0.7 mg/dl (ref 0.20–1.60)

## 2016-06-28 LAB — CBC WITH DIFFERENTIAL (CANCER CENTER ONLY)
BASO#: 0 10*3/uL (ref 0.0–0.2)
BASO%: 0.3 % (ref 0.0–2.0)
EOS ABS: 0.3 10*3/uL (ref 0.0–0.5)
EOS%: 3.3 % (ref 0.0–7.0)
HCT: 42.2 % (ref 38.7–49.9)
HGB: 13.8 g/dL (ref 13.0–17.1)
LYMPH#: 1.9 10*3/uL (ref 0.9–3.3)
LYMPH%: 22.3 % (ref 14.0–48.0)
MCH: 28.1 pg (ref 28.0–33.4)
MCHC: 32.7 g/dL (ref 32.0–35.9)
MCV: 86 fL (ref 82–98)
MONO#: 1.4 10*3/uL — AB (ref 0.1–0.9)
MONO%: 15.9 % — AB (ref 0.0–13.0)
NEUT#: 5 10*3/uL (ref 1.5–6.5)
NEUT%: 58.2 % (ref 40.0–80.0)
PLATELETS: 227 10*3/uL (ref 145–400)
RBC: 4.91 10*6/uL (ref 4.20–5.70)
RDW: 16.4 % — AB (ref 11.1–15.7)
WBC: 8.6 10*3/uL (ref 4.0–10.0)

## 2016-06-28 LAB — IRON AND TIBC
%SAT: 16 % — ABNORMAL LOW (ref 20–55)
Iron: 57 ug/dL (ref 42–163)
TIBC: 351 ug/dL (ref 202–409)
UIBC: 294 ug/dL (ref 117–376)

## 2016-06-28 LAB — FERRITIN: Ferritin: 19 ng/ml — ABNORMAL LOW (ref 22–316)

## 2016-06-28 LAB — LACTATE DEHYDROGENASE: LDH: 138 U/L (ref 125–245)

## 2016-06-28 NOTE — Progress Notes (Signed)
Hematology and Oncology Follow Up Visit  Alexander [name] [id number] [birth year] 76 y.o. 06/28/2016   Principle Diagnosis:  Polycythemia vera-JAK2 negative  Current Therapy:    Phlebotomy to maintain hematocrit below 45% - 12/29/2015  Aspirin 81 mg by mouth daily     Interim History:  Mr.  Alexander [name] is in for a followup. His main complaint is that there is intermittent double vision with his left eye. He's had this for a few weeks. He has seen his ophthalmologist. So far, test have been negative.  He also has been complaining of some pruritus and numbness in the right upper back.  There's been no issues with nausea or vomiting. He's had no actual headache. He's had no fever. He's had no change in bowel or bladder habits.  Both he and his wife have been swimming 2 hours a day in their pool   Some good news   Is that a granddaughter might be moving to [place]. She will be moving from [place]. This would make life a lot easier for Mr. Sorlie and his wife as they can drive to [place].   Overall, his performance status is ECOG 1.  Medications:  Current Outpatient Prescriptions:  .  diphenhydrAMINE (BENADRYL) 2 % cream, Apply topically 3 (three) times daily as needed for itching., Disp: , Rfl:  .  ACCU-CHEK SOFTCLIX LANCETS lancets, Test 3 times a week, Disp: 100 each, Rfl: 5 .  allopurinol (ZYLOPRIM) 100 MG tablet, TAKE ONE TABLET BY MOUTH ONCE DAILY, Disp: 90 tablet, Rfl: 0 .  aspirin EC 81 MG tablet, Take 1 tablet (81 mg total) by mouth daily., Disp: 90 tablet, Rfl: 3 .  azelastine (ASTELIN) 0.1 % nasal spray, Place 1 spray into both nostrils 2 (two) times daily. Use in each nostril as directed, Disp: 30 mL, Rfl: 3 .  Coenzyme Q10 300 MG CAPS, Take 1 capsule by mouth daily. , Disp: , Rfl:  .  glucose blood (ACCU-CHEK AVIVA) test strip, Test 3 times a week, Disp: 100 each, Rfl: 5 .  hydrocortisone (ANUCORT-HC) 25 MG suppository, Take as directed, Disp: 120 suppository, Rfl: 0 .   hydrocortisone (PROCTOZONE-HC) 2.5 % rectal cream, Place rectally 2 (two) times daily., Disp: 60 g, Rfl: 3 .  pantoprazole (PROTONIX) 40 MG tablet, Take 1 tablet by mouth  daily, Disp: 90 tablet, Rfl: 1 .  pravastatin (PRAVACHOL) 40 MG tablet, Take 1 tablet (40 mg total) by mouth daily., Disp: 90 tablet, Rfl: 3 .  prednisoLONE acetate (PRED FORTE) 1 % ophthalmic suspension, , Disp: , Rfl:  .  terbinafine (LAMISIL) 250 MG tablet, Take 1 tablet (250 mg total) by mouth daily., Disp: 90 tablet, Rfl: 0 .  triamcinolone (NASACORT) 55 MCG/ACT AERO nasal inhaler, Place 2 sprays into the nose daily., Disp: 1 Inhaler, Rfl: 12 .  valsartan (DIOVAN) 160 MG tablet, Take 1 tablet (160 mg total) by mouth daily., Disp: 90 tablet, Rfl: 3  Allergies:  Allergies  Allergen Reactions  . Metformin And Related Diarrhea    Past Medical History, Surgical history, Social history, and Family History were reviewed and updated.  Review of Systems: As above  Physical Exam:  weight is 188 lb (85.3 kg). His oral temperature is 100 F (37.8 C). His blood pressure is 146/81 (abnormal) and his pulse is 59 (abnormal). His respiration is 18 and oxygen saturation is 100%.   Well-developed and well-nourished gentleman. Head and neck exam shows no ocular or oral lesions. He has no palpable cervical or supraclavicular  lymph nodes. His lungs are clear with no rales, wheezes or rhonchi. Cardiac exam regular rate and rhythm with no murmurs, rubs or bruits.. Abdomen is soft. He has good bowel sounds. There is no fluid wave. There is no palpable liver or spleen tip. Back exam shows no tenderness over the spine, ribs or hips. Extremities shows no clubbing cyanosis or edema. He has good range of motion of his joints. Has good strength in his extremities. Skin exam no rashes, ecchymoses or petechia. Neurological exam is nonfocal.  Of note, I repeated his blood pressure with a manual cuff. His blood pressure was 132/78.  Lab Results   Component Value Date   WBC 8.6 06/28/2016   HGB 13.8 06/28/2016   HCT 42.2 06/28/2016   MCV 86 06/28/2016   PLT 227 06/28/2016     Chemistry      Component Value Date/Time   NA 140 06/28/2016 1114   NA 139 12/29/2015 0954   K 4.6 06/28/2016 1114   K 4.1 12/29/2015 0954   CL 105 06/28/2016 1114   CO2 28 06/28/2016 1114   CO2 26 12/29/2015 0954   BUN 22 06/28/2016 1114   BUN 20.0 12/29/2015 0954   CREATININE 1.2 06/28/2016 1114   CREATININE 1.3 12/29/2015 0954      Component Value Date/Time   CALCIUM 9.1 06/28/2016 1114   CALCIUM 9.5 12/29/2015 0954   ALKPHOS 68 06/28/2016 1114   ALKPHOS 98 12/29/2015 0954   AST 20 06/28/2016 1114   AST 16 12/29/2015 0954   ALT 23 06/28/2016 1114   ALT 13 12/29/2015 0954   BILITOT 0.70 06/28/2016 1114   BILITOT 0.71 12/29/2015 0954      Impression and Plan: Mr. [name] is 76 year old gentleman. He has polycythemia. He is JAK2 negative however.  I am a little bit worried about this intermittent double vision that he is having. I know he's been to his ophthalmologist. However, a MRI of the brain eats be done. I worry about the possibility of multiple sclerosis.   I talked to he and his wife about this. They are in agreement to go with the MRI. I will order this. If there are problems, he will definitely need to go to neurology.   Thankfully, he does not need phlebotomy. He does not have any hyperviscosity issues because we've phlebotomize him so well.   I would like to see him back in 3 months.   I told him to keep on swimming.   [name], MD 6/27/201812:44 PM

## 2016-06-29 ENCOUNTER — Ambulatory Visit: Payer: Medicare HMO | Admitting: Adult Health

## 2016-07-07 ENCOUNTER — Ambulatory Visit: Payer: Medicare HMO | Admitting: Adult Health

## 2016-07-10 ENCOUNTER — Encounter: Payer: Self-pay | Admitting: Adult Health

## 2016-07-10 MED ORDER — ACCU-CHEK SOFTCLIX LANCETS MISC: 1 refills | Status: DC

## 2016-07-10 MED ORDER — ACCU-CHEK AVIVA PLUS W/DEVICE KIT: PACK | 0 refills | Status: DC

## 2016-07-10 MED ORDER — GLUCOSE BLOOD VI STRP: ORAL_STRIP | 1 refills | Status: DC

## 2016-07-11 ENCOUNTER — Other Ambulatory Visit: Payer: Self-pay | Admitting: *Deleted

## 2016-07-11 MED ORDER — ACCU-CHEK AVIVA PLUS W/DEVICE KIT: PACK | 0 refills | Status: DC

## 2016-07-11 NOTE — Telephone Encounter (Signed)
Rx done. 

## 2016-07-12 ENCOUNTER — Other Ambulatory Visit: Payer: Self-pay | Admitting: *Deleted

## 2016-07-12 MED ORDER — GLUCOSE BLOOD VI STRP: ORAL_STRIP | 1 refills | Status: DC

## 2016-07-12 NOTE — Telephone Encounter (Signed)
Rx done and I left a detailed message at the pts home number with this information.

## 2016-07-13 ENCOUNTER — Other Ambulatory Visit: Payer: Self-pay | Admitting: *Deleted

## 2016-07-13 DIAGNOSIS — R202 Paresthesia of skin: Secondary | ICD-10-CM | POA: Diagnosis not present

## 2016-07-13 DIAGNOSIS — L814 Other melanin hyperpigmentation: Secondary | ICD-10-CM | POA: Diagnosis not present

## 2016-07-13 DIAGNOSIS — L570 Actinic keratosis: Secondary | ICD-10-CM | POA: Diagnosis not present

## 2016-07-13 DIAGNOSIS — L821 Other seborrheic keratosis: Secondary | ICD-10-CM | POA: Diagnosis not present

## 2016-07-13 DIAGNOSIS — D1801 Hemangioma of skin and subcutaneous tissue: Secondary | ICD-10-CM | POA: Diagnosis not present

## 2016-07-13 DIAGNOSIS — D225 Melanocytic nevi of trunk: Secondary | ICD-10-CM | POA: Diagnosis not present

## 2016-07-13 MED ORDER — GLUCOSE BLOOD VI STRP: ORAL_STRIP | 3 refills | Status: DC

## 2016-07-13 MED ORDER — ACCU-CHEK SOFTCLIX LANCETS MISC: 3 refills | Status: DC

## 2016-07-13 MED ORDER — ACCU-CHEK AVIVA PLUS W/DEVICE KIT: PACK | 0 refills | Status: DC

## 2016-07-13 NOTE — Telephone Encounter (Signed)
Patient walked in to the office and informed Arbie Cookey he was informed by [facility] due to his insurance he has to have his diabetic supplies sent to a mail order pharmacy by the name of [facility].  The glucometer and supplies were sent per the pts request.

## 2016-07-17 ENCOUNTER — Encounter: Payer: Self-pay | Admitting: Adult Health

## 2016-07-17 DIAGNOSIS — E118 Type 2 diabetes mellitus with unspecified complications: Secondary | ICD-10-CM | POA: Diagnosis not present

## 2016-07-17 MED ORDER — GLUCOSE BLOOD VI STRP: ORAL_STRIP | 3 refills | Status: DC

## 2016-07-17 MED ORDER — ACCU-CHEK SOFTCLIX LANCETS MISC: 3 refills | Status: DC

## 2016-07-17 MED ORDER — ACCU-CHEK AVIVA PLUS W/DEVICE KIT: PACK | 0 refills | Status: DC

## 2016-07-18 ENCOUNTER — Encounter: Payer: Self-pay | Admitting: Adult Health

## 2016-07-18 ENCOUNTER — Ambulatory Visit (INDEPENDENT_AMBULATORY_CARE_PROVIDER_SITE_OTHER): Payer: Medicare HMO | Admitting: Adult Health

## 2016-07-18 VITALS — BP 158/90 | HR 75 | Temp 97.6°F | Ht 72.0 in | Wt 184.8 lb

## 2016-07-18 DIAGNOSIS — J301 Allergic rhinitis due to pollen: Secondary | ICD-10-CM

## 2016-07-18 NOTE — Progress Notes (Signed)
 Subjective:    Patient ID: Alexander [name], male    DOB: 07/25/1940, 76 y.o.   MRN: [id number]  HPI  76 year old male who presents to the office today for the acute complaints of post nasal drip, seasonal allergies, and non productive cough this has been an issues for 2 days. Reports that he feels improved today   Denies any fevers or feeling acutely ill.   He has been using allergy nasal sprays as directed    Review of Systems See HPI   Past Medical History:  Diagnosis Date  . Arthritis    "minor; shoulders primarily" (05/20/2013)  . CAD (coronary artery disease)    a. 05/2013 - Chest pain/unstable angina and dynamic EKG changes showing cath showing atherosclerotic coronary artery disease manifested as diffuse ectasia, normal EF.  . Diverticulosis of colon   . GERD (gastroesophageal reflux disease)   . GI bleed    secondary to Aspirin  . Gout   . Hematuria, microscopic   . Hemorrhoids   . Hyperglycemia   . Hyperlipidemia   . Hypertension   . Lumbar back pain   . Microalbuminuria   . Overweight(278.02)   . Polycythemia rubra vera ([facility])   . Tubular adenoma of colon 07/2012    Social History   Social History  . Marital status: Married    Spouse name: N/A  . Number of children: 3  . Years of education: N/A   Occupational History  . retired engineer Retired   Social History Main Topics  . Smoking status: Never Smoker  . Smokeless tobacco: Never Used     Comment: NEVER USED TOBACCO  . Alcohol use No  . Drug use: No  . Sexual activity: Yes    Partners: Female   Other Topics Concern  . Not on file   Social History Narrative   Marriedfor 52 years    Daughter, Marcie (pediatrician--lives in [place], [place]), one in [place] - psychologist. One in [place] - Author              Past Surgical History:  Procedure Laterality Date  . CARDIAC CATHETERIZATION  05/20/2013  . CATARACT EXTRACTION W/ INTRAOCULAR LENS  IMPLANT, BILATERAL Bilateral 2008  . CYSTECTOMY  ~  2000   " SEBACEOUS cyst removed from between shoulder blades"  . EXCISIONAL HEMORRHOIDECTOMY  1970's  . INGUINAL HERNIA REPAIR Right ~ 2010  . LEFT HEART CATHETERIZATION WITH CORONARY ANGIOGRAM N/A 05/20/2013   Procedure: LEFT HEART CATHETERIZATION WITH CORONARY ANGIOGRAM;  Surgeon: Peter M Jordan, MD;  Location: [facility] CATH LAB;  Service: Cardiovascular;  Laterality: N/A;  . TONSILLECTOMY AND ADENOIDECTOMY  1940's  . VASECTOMY      Family History  Problem Relation Age of Onset  . Colon cancer Mother        died at 73, colorectal  . Diabetes Mother   . Heart disease Father 64       MI  . Diabetes Father   . Stomach cancer Maternal Grandfather   . Cancer Maternal Uncle     Allergies  Allergen Reactions  . Metformin And Related Diarrhea    Current Outpatient Prescriptions on File Prior to Visit  Medication Sig Dispense Refill  . ACCU-CHEK SOFTCLIX LANCETS lancets Use to test blood glucose once daily 100 each 3  . allopurinol (ZYLOPRIM) 100 MG tablet TAKE ONE TABLET BY MOUTH ONCE DAILY 90 tablet 0  . aspirin EC 81 MG tablet Take 1 tablet (81 mg total) by mouth   daily. 90 tablet 3  . azelastine (ASTELIN) 0.1 % nasal spray Place 1 spray into both nostrils 2 (two) times daily. Use in each nostril as directed 30 mL 3  . Blood Glucose Monitoring Suppl (ACCU-CHEK AVIVA PLUS) w/Device KIT Use to test blood glucose once daily 1 kit 0  . Coenzyme Q10 300 MG CAPS Take 1 capsule by mouth daily.     . diphenhydrAMINE (BENADRYL) 2 % cream Apply topically 3 (three) times daily as needed for itching.    [name] glucose blood (ACCU-CHEK AVIVA) test strip Use to test blood glucose once daily 100 each 3  . hydrocortisone (ANUCORT-HC) 25 MG suppository Take as directed 120 suppository 0  . hydrocortisone (PROCTOZONE-HC) 2.5 % rectal cream Place rectally 2 (two) times daily. 60 g 3  . pantoprazole (PROTONIX) 40 MG tablet Take 1 tablet by mouth  daily 90 tablet 1  . pravastatin (PRAVACHOL) 40 MG tablet Take 1  tablet (40 mg total) by mouth daily. 90 tablet 3  . prednisoLONE acetate (PRED FORTE) 1 % ophthalmic suspension     . terbinafine (LAMISIL) 250 MG tablet Take 1 tablet (250 mg total) by mouth daily. 90 tablet 0  . triamcinolone (NASACORT) 55 MCG/ACT AERO nasal inhaler Place 2 sprays into the nose daily. 1 Inhaler 12  . valsartan (DIOVAN) 160 MG tablet Take 1 tablet (160 mg total) by mouth daily. 90 tablet 3   No current facility-administered medications on file prior to visit.     BP (!) 158/90 (BP Location: Left Arm, Patient Position: Standing, Cuff Size: Normal)   Pulse 75   Temp 97.6 F (36.4 C) (Oral)   Ht 6' (1.829 m)   Wt 184 lb 12.8 oz (83.8 kg)   SpO2 97%   BMI 25.06 kg/m       Objective:   Physical Exam  Constitutional: He is oriented to person, place, and time. He appears well-developed and well-nourished. No distress.  HENT:  Head: Normocephalic and atraumatic.  Right Ear: External ear normal.  Left Ear: External ear normal.  Nose: Nose normal.  Mouth/Throat: Oropharynx is clear and moist. No oropharyngeal exudate.  Eyes: Pupils are equal, round, and reactive to light. Conjunctivae and EOM are normal. Right eye exhibits no discharge. Left eye exhibits no discharge. No scleral icterus.  Neck: Normal range of motion. Neck supple. No thyromegaly present.  Cardiovascular: Normal rate, regular rhythm, normal heart sounds and intact distal pulses.  Exam reveals no gallop and no friction rub.   No murmur heard. Pulmonary/Chest: Effort normal and breath sounds normal. No respiratory distress. He has no wheezes. He has no rales.  Lymphadenopathy:    He has no cervical adenopathy.  Neurological: He is alert and oriented to person, place, and time.  Skin: Skin is warm and dry. No rash noted. He is not diaphoretic. No erythema. No pallor.  Psychiatric: He has a normal mood and affect. His behavior is normal. Judgment and thought content normal.  Nursing note and vitals  reviewed.     Assessment & Plan:  1. Seasonal allergic rhinitis due to pollen - No signs of bacterial infection  - Can add Claritin.  - Follow up if no improvement  [name], NP

## 2016-07-19 ENCOUNTER — Telehealth: Payer: Self-pay | Admitting: Adult Health

## 2016-07-19 ENCOUNTER — Ambulatory Visit
Admission: RE | Admit: 2016-07-19 | Discharge: 2016-07-19 | Disposition: A | Discharge status: Home / Self Care | Payer: Medicare HMO | Source: Ambulatory Visit | Attending: Hematology & Oncology | Admitting: Hematology & Oncology

## 2016-07-19 ENCOUNTER — Encounter: Payer: Self-pay | Admitting: Adult Health

## 2016-07-19 ENCOUNTER — Encounter: Payer: Self-pay | Admitting: Hematology & Oncology

## 2016-07-19 DIAGNOSIS — H532 Diplopia: Secondary | ICD-10-CM | POA: Diagnosis not present

## 2016-07-19 DIAGNOSIS — R200 Anesthesia of skin: Secondary | ICD-10-CM | POA: Diagnosis not present

## 2016-07-19 MED ORDER — GADOBENATE DIMEGLUMINE 529 MG/ML IV SOLN
18.0000 mL | Freq: Once | INTRAVENOUS | Status: AC | PRN
  Administered 2016-07-19: 18 mL via INTRAVENOUS

## 2016-07-19 NOTE — Telephone Encounter (Signed)
Pt is calling to let misty know valsartan has been recalled and he would like amlodipine call into [facility] [address]. The amlodipine his ins will cover

## 2016-07-19 NOTE — Telephone Encounter (Signed)
Can we check with pharmacy to make sure his was recalled?

## 2016-07-20 ENCOUNTER — Telehealth: Payer: Self-pay | Admitting: *Deleted

## 2016-07-20 ENCOUNTER — Encounter: Payer: Self-pay | Admitting: Adult Health

## 2016-07-20 NOTE — Telephone Encounter (Signed)
Spoke with patient and he will check with his insurance and send message if Cozaar 50 mg is covered.

## 2016-07-20 NOTE — Telephone Encounter (Addendum)
Patient is aware of results.   ----- Message from [name], MD sent at 07/20/2016 10:13 AM EDT ----- Call - No problems in the brain!!!  Everything looks ok!!  Alexander [name]

## 2016-07-21 ENCOUNTER — Telehealth: Payer: Self-pay | Admitting: Adult Health

## 2016-07-21 MED ORDER — LOSARTAN POTASSIUM 50 MG PO TABS: 50.0000 mg | ORAL_TABLET | Freq: Every day | ORAL | 0 refills | Status: DC

## 2016-07-21 NOTE — Telephone Encounter (Signed)
Sent in losartan 50 mg per Tommi Rumps.  See MyChart message.

## 2016-07-21 NOTE — Telephone Encounter (Signed)
Pt is calling wanting to get losartan in place of the valsartan that was recalled.  Pt state that he has been without for 3 days and really would like to have this today.  Pharm:  [facility]  Pt would like to have a call back once this is done

## 2016-07-25 ENCOUNTER — Encounter: Payer: Self-pay | Admitting: Adult Health

## 2016-07-27 ENCOUNTER — Encounter (HOSPITAL_BASED_OUTPATIENT_CLINIC_OR_DEPARTMENT_OTHER): Payer: Self-pay | Admitting: *Deleted

## 2016-07-27 ENCOUNTER — Emergency Department (HOSPITAL_BASED_OUTPATIENT_CLINIC_OR_DEPARTMENT_OTHER)
Admission: EM | Admit: 2016-07-27 | Discharge: 2016-07-27 | Disposition: A | Discharge status: Home / Self Care | Payer: Medicare HMO | Attending: Emergency Medicine | Admitting: Emergency Medicine

## 2016-07-27 DIAGNOSIS — Z7982 Long term (current) use of aspirin: Secondary | ICD-10-CM | POA: Insufficient documentation

## 2016-07-27 DIAGNOSIS — I2510 Atherosclerotic heart disease of native coronary artery without angina pectoris: Secondary | ICD-10-CM | POA: Insufficient documentation

## 2016-07-27 DIAGNOSIS — K921 Melena: Secondary | ICD-10-CM | POA: Diagnosis not present

## 2016-07-27 DIAGNOSIS — I10 Essential (primary) hypertension: Secondary | ICD-10-CM | POA: Diagnosis not present

## 2016-07-27 DIAGNOSIS — Z79899 Other long term (current) drug therapy: Secondary | ICD-10-CM | POA: Diagnosis not present

## 2016-07-27 DIAGNOSIS — K649 Unspecified hemorrhoids: Secondary | ICD-10-CM | POA: Diagnosis present

## 2016-07-27 LAB — BASIC METABOLIC PANEL
ANION GAP: 10 (ref 5–15)
BUN: 24 mg/dL — AB (ref 6–20)
CALCIUM: 9.3 mg/dL (ref 8.9–10.3)
CO2: 26 mmol/L (ref 22–32)
Chloride: 103 mmol/L (ref 101–111)
Creatinine, Ser: 1.26 mg/dL — ABNORMAL HIGH (ref 0.61–1.24)
GFR calc Af Amer: >60 mL/min (ref >60)
GFR calc non Af Amer: 54 mL/min — ABNORMAL LOW (ref >60)
GLUCOSE: 107 mg/dL — AB (ref 65–99)
Potassium: 4.1 mmol/L (ref 3.5–5.1)
Sodium: 139 mmol/L (ref 135–145)

## 2016-07-27 LAB — CBC WITH DIFFERENTIAL/PLATELET
BASOS ABS: 0 10*3/uL (ref 0.0–0.1)
BASOS PCT: 1 %
EOS ABS: 0.2 10*3/uL (ref 0.0–0.7)
Eosinophils Relative: 2 %
HCT: 41.9 % (ref 39.0–52.0)
HEMOGLOBIN: 13.9 g/dL (ref 13.0–17.0)
Lymphocytes Relative: 18 %
Lymphs Abs: 1.4 10*3/uL (ref 0.7–4.0)
MCH: 27.9 pg (ref 26.0–34.0)
MCHC: 33.2 g/dL (ref 30.0–36.0)
MCV: 84.1 fL (ref 78.0–100.0)
MONOS PCT: 13 %
Monocytes Absolute: 1 10*3/uL (ref 0.1–1.0)
NEUTROS ABS: 5 10*3/uL (ref 1.7–7.7)
NEUTROS PCT: 66 %
Platelets: 211 10*3/uL (ref 150–400)
RBC: 4.98 MIL/uL (ref 4.22–5.81)
RDW: 16.6 % — ABNORMAL HIGH (ref 11.5–15.5)
WBC: 7.6 10*3/uL (ref 4.0–10.5)

## 2016-07-27 NOTE — ED Triage Notes (Signed)
Pt c/o bleeding Hemorid  x 1 day

## 2016-07-27 NOTE — ED Provider Notes (Signed)
[facility] Provider Note   CSN: [id number] Arrival date & time: 07/27/16  1638  By signing my name below, I, Margit Banda, attest that this documentation has been prepared under the direction and in the presence of Quintella Reichert, MD. Electronically Signed: Margit Banda, ED Scribe. 07/27/16. 6:02 PM.  History   Chief Complaint Chief Complaint  Patient presents with  . Hemorrhoids    HPI Alexander [name] is a 76 y.o. male with a PMHx of polycythemia rubra vera and hemorrhoids, who presents to the Emergency Department complaining of intermittent hemorrhoids for the last few years, worsening today. Associated sx include dark red and bright red blood in stool. He takes blood thinners. Pt notes using witch hazel earlier today helped to stop his bleeding. Pt denies rectal pain, pain with BM and fever.Today he had 2 bloody bowel movements. He denies any shortness of breath, lightheadedness, chest pain, bowel pain, malaise, fatigue.  The history is provided by the patient. No language interpreter was used.    Past Medical History:  Diagnosis Date  . Arthritis    "minor; shoulders primarily" (05/20/2013)  . CAD (coronary artery disease)    a. 05/2013 - Chest pain/unstable angina and dynamic EKG changes showing cath showing atherosclerotic coronary artery disease manifested as diffuse ectasia, normal EF.  . Diverticulosis of colon   . GERD (gastroesophageal reflux disease)   . GI bleed    secondary to Aspirin  . Gout   . Hematuria, microscopic   . Hemorrhoids   . Hyperglycemia   . Hyperlipidemia   . Hypertension   . Lumbar back pain   . Microalbuminuria   . Overweight(278.02)   . Polycythemia rubra vera ([place])   . Tubular adenoma of colon 07/2012    Patient Active Problem List   Diagnosis Date Noted  . Hemorrhoids 01/21/2014  . CAD (coronary artery disease) 05/22/2013  . Pulmonary nodule 05/22/2013  . Abnormal ECG 05/20/2013  . Angina at rest [facility])  05/20/2013  . Unstable angina ([place]) 05/20/2013  . Obesity 05/20/2013  . Neck pain on right side 10/03/2012  . PRB (rectal bleeding) 06/07/2012  . Allergic dermatitis 06/30/2011  . Shoulder pain 02/06/2011  . Allergic rhinitis 04/29/2010  . GERD 10/05/2008  . MICROALBUMINURIA 07/22/2008  . BACK PAIN, LUMBAR 06/26/2008  . MICROSCOPIC HEMATURIA 03/20/2007  . Prediabetes 12/13/2006  . Polycythemia vera ([place]) 11/26/2006  . Hyperlipidemia 11/26/2006  . Gout 11/26/2006  . Essential hypertension 11/26/2006  . DIVERTICULOSIS, COLON 11/26/2006  . INGUINAL PAIN, RIGHT 11/26/2006  . COLONIC POLYPS, HX OF 11/26/2006  . NEPHROLITHIASIS, HX OF 11/26/2006    Past Surgical History:  Procedure Laterality Date  . CARDIAC CATHETERIZATION  05/20/2013  . CATARACT EXTRACTION W/ INTRAOCULAR LENS  IMPLANT, BILATERAL Bilateral 2008  . CYSTECTOMY  ~ 2000   " SEBACEOUS cyst removed from between shoulder blades"  . EXCISIONAL HEMORRHOIDECTOMY  1970's  . INGUINAL HERNIA REPAIR Right ~ 2010  . LEFT HEART CATHETERIZATION WITH CORONARY ANGIOGRAM N/A 05/20/2013   Procedure: LEFT HEART CATHETERIZATION WITH CORONARY ANGIOGRAM;  Surgeon: Peter M Martinique, MD;  Location: [facility] CATH LAB;  Service: Cardiovascular;  Laterality: N/A;  . TONSILLECTOMY AND ADENOIDECTOMY  1940's  . VASECTOMY         Home Medications    Prior to Admission medications   Medication Sig Start Date End Date Taking? Authorizing Provider  ACCU-CHEK SOFTCLIX LANCETS lancets Use to test blood glucose once daily 07/17/16   [name], NP  allopurinol (ZYLOPRIM) 100  MG tablet TAKE ONE TABLET BY MOUTH ONCE DAILY 02/28/16   [name], NP  aspirin EC 81 MG tablet Take 1 tablet (81 mg total) by mouth daily. 02/06/14   Jerline Pain, MD  azelastine (ASTELIN) 0.1 % nasal spray Place 1 spray into both nostrils 2 (two) times daily. Use in each nostril as directed 03/26/15   Shawna Orleans, Doe-Hyun R, DO  Blood Glucose Monitoring Suppl (ACCU-CHEK AVIVA PLUS)  w/Device KIT Use to test blood glucose once daily 07/17/16   Nafziger, Tommi Rumps, NP  Coenzyme Q10 300 MG CAPS Take 1 capsule by mouth daily.     [provider]  diphenhydrAMINE (BENADRYL) 2 % cream Apply topically 3 (three) times daily as needed for itching.    [provider]  glucose blood (ACCU-CHEK AVIVA) test strip Use to test blood glucose once daily 07/17/16   Nafziger, Tommi Rumps, NP  hydrocortisone (ANUCORT-HC) 25 MG suppository Take as directed 01/07/16   Nafziger, Tommi Rumps, NP  hydrocortisone (PROCTOZONE-HC) 2.5 % rectal cream Place rectally 2 (two) times daily. 06/22/16   Nafziger, Tommi Rumps, NP  losartan (COZAAR) 50 MG tablet Take 1 tablet (50 mg total) by mouth daily. 07/21/16   Nafziger, Tommi Rumps, NP  pantoprazole (PROTONIX) 40 MG tablet Take 1 tablet by mouth  daily 03/14/16   Nafziger, Tommi Rumps, NP  pravastatin (PRAVACHOL) 40 MG tablet Take 1 tablet (40 mg total) by mouth daily. 03/14/16   Nafziger, Tommi Rumps, NP  prednisoLONE acetate (PRED FORTE) 1 % ophthalmic suspension  03/28/16   [provider]  terbinafine (LAMISIL) 250 MG tablet Take 1 tablet (250 mg total) by mouth daily. 06/22/16   Nafziger, Tommi Rumps, NP  triamcinolone (NASACORT) 55 MCG/ACT AERO nasal inhaler Place 2 sprays into the nose daily. 06/22/16   [name], NP    Family History Family History  Problem Relation Age of Onset  . Colon cancer Mother        died at 85, colorectal  . Diabetes Mother   . Heart disease Father 56       MI  . Diabetes Father   . Stomach cancer Maternal Grandfather   . Cancer Maternal Uncle     Social History Social History  Substance Use Topics  . Smoking status: Never Smoker  . Smokeless tobacco: Never Used     Comment: NEVER USED TOBACCO  . Alcohol use No     Allergies   Metformin and related   Review of Systems Review of Systems  Constitutional: Negative for fever.  Gastrointestinal: Positive for blood in stool. Negative for rectal pain.  All other systems reviewed and are  negative.    Physical Exam Updated Vital Signs BP (!) 158/103 (BP Location: Right Arm)   Pulse 89   Temp 98.6 F (37 C)   Resp 18   Ht 6' (1.829 m)   Wt 82.6 kg (182 lb)   SpO2 100%   BMI 24.68 kg/m   Physical Exam  Constitutional: He is oriented to person, place, and time. He appears well-developed and well-nourished.  HENT:  Head: Normocephalic and atraumatic.  Cardiovascular: Normal rate and regular rhythm.   No murmur heard. Pulmonary/Chest: Effort normal and breath sounds normal. No respiratory distress.  Abdominal: Soft. There is no tenderness. There is no rebound and no guarding.  Genitourinary:  Genitourinary Comments: Chaperone present. Non tender. No gross blood. Small non external hemorrhoid.  Musculoskeletal: He exhibits no edema or tenderness.  Neurological: He is alert and oriented to person, place, and time.  Skin: Skin is warm and dry.  Psychiatric: He has a normal mood and affect. His behavior is normal.  Nursing note and vitals reviewed.    ED Treatments / Results  DIAGNOSTIC STUDIES: Oxygen Saturation is 100% on RA, normal by my interpretation.   COORDINATION OF CARE: 6:02 PM-Discussed next steps with pt which includes checking his blood counts and following up with Dr. Fuller Plan. Pt verbalized understanding and is agreeable with the plan.   Labs (all labs ordered are listed, but only abnormal results are displayed) Labs Reviewed  BASIC METABOLIC PANEL - Abnormal; Notable for the following:       Result Value   Glucose, Bld 107 (*)    BUN 24 (*)    Creatinine, Ser 1.26 (*)    GFR calc non Af Amer 54 (*)    All other components within normal limits  CBC WITH DIFFERENTIAL/PLATELET - Abnormal; Notable for the following:    RDW 16.6 (*)    All other components within normal limits    EKG  EKG Interpretation None       Radiology No results found.  Procedures Procedures (including critical care time)  Medications Ordered in  ED Medications - No data to display   Initial Impression / Assessment and Plan / ED Course  I have reviewed the triage vital signs and the nursing notes.  Pertinent labs & imaging results that were available during my care of the patient were reviewed by me and considered in my medical decision making (see chart for details).     Patient with history of polycythemia as well as internal hemorrhoids here for episodes of hematochezia 2 prior to ED arrival. He is well appearing on examination with no systemic complaints. CBC demonstrates stable hemoglobin. Rectal exam is nontender with no gross blood. Discussed with patient home care for hematochezia, no evidence of major hemorrhage at this time. Discussed following up with PCP as well as his gastroenterologist. Return precautions discussed.  Final Clinical Impressions(s) / ED Diagnoses   Final diagnoses:  Hematochezia    New Prescriptions Discharge Medication List as of 07/27/2016  6:56 PM     I personally performed the services described in this documentation, which was scribed in my presence. The recorded information has been reviewed and is accurate.    Quintella Reichert, MD 07/28/16 [phone]

## 2016-08-01 ENCOUNTER — Ambulatory Visit (INDEPENDENT_AMBULATORY_CARE_PROVIDER_SITE_OTHER): Payer: Medicare HMO | Admitting: Adult Health

## 2016-08-01 ENCOUNTER — Encounter: Payer: Self-pay | Admitting: Adult Health

## 2016-08-01 VITALS — BP 152/78 | Temp 98.6°F | Wt 185.0 lb

## 2016-08-01 DIAGNOSIS — I10 Essential (primary) hypertension: Secondary | ICD-10-CM

## 2016-08-01 MED ORDER — LOSARTAN POTASSIUM 100 MG PO TABS: 100.0000 mg | ORAL_TABLET | Freq: Every day | ORAL | 3 refills | Status: DC

## 2016-08-01 NOTE — Progress Notes (Signed)
Subjective:    Patient ID: Alexander [name], male    DOB: 06-02-1940, 76 y.o.   MRN: [id number]  HPI   76 year old male who  has a past medical history of Arthritis; CAD (coronary artery disease); Diverticulosis of colon; GERD (gastroesophageal reflux disease); GI bleed; Gout; Hematuria, microscopic; Hemorrhoids; Hyperglycemia; Hyperlipidemia; Hypertension; Lumbar back pain; Microalbuminuria; Overweight(278.02); Polycythemia rubra vera ([place]); and Tubular adenoma of colon (07/2012).  He presents to the office today for follow up after change in blood pressure medication. He was taking Valsartan 160 mg but his medication was recalled due to potential cancer risk. He was switched to Cozaar 57m   He reports that his blood pressure has been consistently in the 150/90's. He denies any side effects   BP Readings from Last 3 Encounters:  08/01/16 (!) 152/78  07/27/16 (!) 158/103  07/18/16 (!) 158/90    Review of Systems See HPI   Past Medical History:  Diagnosis Date  . Arthritis    "minor; shoulders primarily" (05/20/2013)  . CAD (coronary artery disease)    a. 05/2013 - Chest pain/unstable angina and dynamic EKG changes showing cath showing atherosclerotic coronary artery disease manifested as diffuse ectasia, normal EF.  . Diverticulosis of colon   . GERD (gastroesophageal reflux disease)   . GI bleed    secondary to Aspirin  . Gout   . Hematuria, microscopic   . Hemorrhoids   . Hyperglycemia   . Hyperlipidemia   . Hypertension   . Lumbar back pain   . Microalbuminuria   . Overweight(278.02)   . [place]mia [place]ra [place]a [place]   . Tubular adenoma of colon 07/2012    Social History   Social History  . Marital status: Married    Spouse name: N/A  . Number of children: 3  . Years of education: N/A   Occupational History  . retired eChief Financial OfficerRetired   Social History Main Topics  . Smoking status: Never Smoker  . Smokeless tobacco: Never Used     Comment: NEVER USED  TOBACCO  . Alcohol use No  . Drug use: No  . Sexual activity: Yes    Partners: Female   Other Topics Concern  . Not on file   Social History Narrative   Marriedfor 558years    Daughter, MCorky Sox(pediatrician[place], [place]Engineer[place] in [place]- AChief Strategy Officer             Past Surgical History:  Procedure Laterality Date  . CARDIAC CATHETERIZATION  05/20/2013  . CATARACT EXTRACTION W/ INTRAOCULAR LENS  IMPLANT, BILATERAL Bilateral 2008  . CYSTECTOMY  ~ 2000   " SEBACEOUS cyst removed from between shoulder blades"  . EXCISIONAL HEMORRHOIDECTOMY  1970's  . INGUINAL HERNIA REPAIR Right ~ 2010  . LEFT HEART CATHETERIZATION WITH CORONARY ANGIOGRAM N/A 05/20/2013   Procedure: LEFT HEART CATHETERIZATION WITH CORONARY ANGIOGRAM;  Surgeon: Peter M JMartinique MD;  [facility];  Service: Cardiovascular;  Laterality: N/A;  . TONSILLECTOMY AND ADENOIDECTOMY  1940's  . VASECTOMY      Family History  Problem Relation Age of Onset  . Colon cancer Mother        died at 770 colorectal  . Diabetes Mother   . Heart disease Father 667      MI  . Diabetes Father   . Stomach cancer Maternal Grandfather   . Cancer Maternal Uncle     Allergies  Allergen Reactions  .  Metformin And Related Diarrhea    Current Outpatient Prescriptions on File Prior to Visit  Medication Sig Dispense Refill  . ACCU-CHEK SOFTCLIX LANCETS lancets Use to test blood glucose once daily 100 each 3  . allopurinol (ZYLOPRIM) 100 MG tablet TAKE ONE TABLET BY MOUTH ONCE DAILY 90 tablet 0  . aspirin EC 81 MG tablet Take 1 tablet (81 mg total) by mouth daily. 90 tablet 3  . azelastine (ASTELIN) 0.1 % nasal spray Place 1 spray into both nostrils 2 (two) times daily. Use in each nostril as directed 30 mL 3  . Blood Glucose Monitoring Suppl (ACCU-CHEK AVIVA PLUS) w/Device KIT Use to test blood glucose once daily 1 kit 0  . Coenzyme Q10 300 MG CAPS Take 1 capsule by mouth daily.     .  diphenhydrAMINE (BENADRYL) 2 % cream Apply topically 3 (three) times daily as needed for itching.    [name] glucose blood (ACCU-CHEK AVIVA) test strip Use to test blood glucose once daily 100 each 3  . hydrocortisone (ANUCORT-HC) 25 MG suppository Take as directed 120 suppository 0  . hydrocortisone (PROCTOZONE-HC) 2.5 % rectal cream Place rectally 2 (two) times daily. 60 g 3  . losartan (COZAAR) 50 MG tablet Take 1 tablet (50 mg total) by mouth daily. 30 tablet 0  . pantoprazole (PROTONIX) 40 MG tablet Take 1 tablet by mouth  daily 90 tablet 1  . pravastatin (PRAVACHOL) 40 MG tablet Take 1 tablet (40 mg total) by mouth daily. 90 tablet 3  . prednisoLONE acetate (PRED FORTE) 1 % ophthalmic suspension     . terbinafine (LAMISIL) 250 MG tablet Take 1 tablet (250 mg total) by mouth daily. 90 tablet 0  . triamcinolone (NASACORT) 55 MCG/ACT AERO nasal inhaler Place 2 sprays into the nose daily. 1 Inhaler 12   No current facility-administered medications on file prior to visit.     BP (!) 152/78 (BP Location: Right Arm)   Temp 98.6 F (37 C) (Oral)   Wt 185 lb (83.9 kg)   BMI 25.09 kg/m       Objective:   Physical Exam  Constitutional: He is oriented to person, place, and time. He appears well-developed and well-nourished. No distress.  Cardiovascular: Normal rate, regular rhythm, normal heart sounds and intact distal pulses.  Exam reveals no gallop.   No murmur heard. Pulmonary/Chest: Effort normal and breath sounds normal. No respiratory distress. He has no wheezes. He has no rales. He exhibits no tenderness.  Neurological: He is alert and oriented to person, place, and time.  Skin: Skin is warm and dry. No rash noted. He is not diaphoretic. No erythema. No pallor.  Psychiatric: He has a normal mood and affect. His behavior is normal. Judgment and thought content normal.  Nursing note and vitals reviewed.     Assessment & Plan:  1. Essential hypertension - Increase Cozzar to 13m.  -  losartan (COZAAR) 100 MG tablet; Take 1 tablet (100 mg total) by mouth daily.  Dispense: 90 tablet; Refill: 3 - Send me BP results via Mychart in 1 week  CDorothyann Peng NP

## 2016-08-03 DIAGNOSIS — H532 Diplopia: Secondary | ICD-10-CM | POA: Diagnosis not present

## 2016-08-06 ENCOUNTER — Other Ambulatory Visit: Payer: Self-pay | Admitting: Adult Health

## 2016-08-31 ENCOUNTER — Ambulatory Visit (INDEPENDENT_AMBULATORY_CARE_PROVIDER_SITE_OTHER): Payer: Medicare HMO | Admitting: Adult Health

## 2016-08-31 ENCOUNTER — Encounter: Payer: Self-pay | Admitting: Adult Health

## 2016-08-31 VITALS — BP 120/80 | Temp 97.9°F | Ht 69.75 in | Wt 183.0 lb

## 2016-08-31 DIAGNOSIS — Z125 Encounter for screening for malignant neoplasm of prostate: Secondary | ICD-10-CM | POA: Diagnosis not present

## 2016-08-31 DIAGNOSIS — Z23 Encounter for immunization: Secondary | ICD-10-CM | POA: Diagnosis not present

## 2016-08-31 DIAGNOSIS — R7303 Prediabetes: Secondary | ICD-10-CM

## 2016-08-31 DIAGNOSIS — I10 Essential (primary) hypertension: Secondary | ICD-10-CM

## 2016-08-31 DIAGNOSIS — E785 Hyperlipidemia, unspecified: Secondary | ICD-10-CM | POA: Diagnosis not present

## 2016-08-31 DIAGNOSIS — Z0000 Encounter for general adult medical examination without abnormal findings: Secondary | ICD-10-CM

## 2016-08-31 DIAGNOSIS — I2583 Coronary atherosclerosis due to lipid rich plaque: Secondary | ICD-10-CM

## 2016-08-31 DIAGNOSIS — I2510 Atherosclerotic heart disease of native coronary artery without angina pectoris: Secondary | ICD-10-CM

## 2016-08-31 DIAGNOSIS — K219 Gastro-esophageal reflux disease without esophagitis: Secondary | ICD-10-CM

## 2016-08-31 LAB — BASIC METABOLIC PANEL
BUN: 27 mg/dL — ABNORMAL HIGH (ref 6–23)
CHLORIDE: 103 meq/L (ref 96–112)
CO2: 30 mEq/L (ref 19–32)
Calcium: 9.7 mg/dL (ref 8.4–10.5)
Creatinine, Ser: 1.27 mg/dL (ref 0.40–1.50)
GFR: 58.52 mL/min — AB (ref >60.00)
Glucose, Bld: 109 mg/dL — ABNORMAL HIGH (ref 70–99)
Potassium: 4.8 mEq/L (ref 3.5–5.1)
Sodium: 139 mEq/L (ref 135–145)

## 2016-08-31 LAB — CBC WITH DIFFERENTIAL/PLATELET
BASOS PCT: 0.8 % (ref 0.0–3.0)
Basophils Absolute: 0.1 10*3/uL (ref 0.0–0.1)
EOS PCT: 2.9 % (ref 0.0–5.0)
Eosinophils Absolute: 0.2 10*3/uL (ref 0.0–0.7)
HEMATOCRIT: 44.5 % (ref 39.0–52.0)
HEMOGLOBIN: 14.2 g/dL (ref 13.0–17.0)
Lymphocytes Relative: 21.3 % (ref 12.0–46.0)
Lymphs Abs: 1.4 10*3/uL (ref 0.7–4.0)
MCHC: 32 g/dL (ref 30.0–36.0)
MCV: 88.1 fl (ref 78.0–100.0)
MONOS PCT: 13.8 % — AB (ref 3.0–12.0)
Monocytes Absolute: 0.9 10*3/uL (ref 0.1–1.0)
Neutro Abs: 4 10*3/uL (ref 1.4–7.7)
Neutrophils Relative %: 61.2 % (ref 43.0–77.0)
Platelets: 234 10*3/uL (ref 150.0–400.0)
RBC: 5.05 Mil/uL (ref 4.22–5.81)
RDW: 17.5 % — ABNORMAL HIGH (ref 11.5–15.5)
WBC: 6.6 10*3/uL (ref 4.0–10.5)

## 2016-08-31 LAB — HEPATIC FUNCTION PANEL
ALBUMIN: 4.2 g/dL (ref 3.5–5.2)
ALK PHOS: 73 U/L (ref 39–117)
ALT: 15 U/L (ref 0–53)
AST: 15 U/L (ref 0–37)
Bilirubin, Direct: 0.1 mg/dL (ref 0.0–0.3)
TOTAL PROTEIN: 6.9 g/dL (ref 6.0–8.3)
Total Bilirubin: 0.7 mg/dL (ref 0.2–1.2)

## 2016-08-31 LAB — LIPID PANEL
Cholesterol: 174 mg/dL (ref 0–200)
HDL: 45.6 mg/dL (ref >39.00)
LDL Cholesterol: 111 mg/dL — ABNORMAL HIGH (ref 0–99)
NONHDL: 127.9
Total CHOL/HDL Ratio: 4
Triglycerides: 87 mg/dL (ref 0.0–149.0)
VLDL: 17.4 mg/dL (ref 0.0–40.0)

## 2016-08-31 LAB — HEMOGLOBIN A1C: Hgb A1c MFr Bld: 6.3 % (ref 4.6–6.5)

## 2016-08-31 LAB — PSA: PSA: 0.4 ng/mL (ref 0.10–4.00)

## 2016-08-31 LAB — TSH: TSH: 0.95 u[IU]/mL (ref 0.35–4.50)

## 2016-08-31 NOTE — Addendum Note (Signed)
Addended by: Miles Costain T on: 08/31/2016 09:59 AM   Modules accepted: Orders

## 2016-08-31 NOTE — Patient Instructions (Signed)
It was great seeing you today   I will follow up with you regarding your blood work   Please let me know if you need anything

## 2016-08-31 NOTE — Progress Notes (Signed)
Subjective:    Patient ID: Alexander [name], male    DOB: 13-Feb-1940, 76 y.o.   MRN: [id number]  HPI  Patient presents for yearly preventative medicine examination. He is a pleasant and active  76 year old male who  has a past medical history of Arthritis; CAD (coronary artery disease); Diverticulosis of colon; GERD (gastroesophageal reflux disease); GI bleed; Gout; Hematuria, microscopic; Hemorrhoids; Hyperglycemia; Hyperlipidemia; Hypertension; Lumbar back pain; Microalbuminuria; Overweight(278.02); Polycythemia rubra vera ([place]); and Tubular adenoma of colon (07/2012).  All immunizations and health maintenance protocols were reviewed with the patient and needed orders were placed. He is up to date on his vaccinations.  Appropriate screening laboratory values were ordered for the patient including screening of hyperlipidemia, renal function and hepatic function. If indicated by BPH, a PSA was ordered.  Medication reconciliation,  past medical history, social history, problem list and allergies were reviewed in detail with the patient  Goals were established with regard to weight loss, exercise, and  diet in compliance with medications  End of life planning was discussed.  He takes Allopurinol 138m for gout   He takes Cozaar 100 mg for essential hypertension   He takes Pravastatin 40 mg for hyperlipidemia   He takes Protonix 40 mg for GERD  He is up to date on dental and vision screens. He has aged out of colonoscopies    Review of Systems  Constitutional: Negative.   HENT: Negative.   Eyes: Negative.   Respiratory: Negative.   Cardiovascular: Negative.   Gastrointestinal: Negative.   Endocrine: Negative.   Genitourinary: Negative.   Musculoskeletal: Negative.   Skin: Negative.   Allergic/Immunologic: Negative.   Neurological: Negative.   Hematological: Negative.   Psychiatric/Behavioral: Negative.   All other systems reviewed and are negative.  Past Medical  History:  Diagnosis Date  . Arthritis    "minor; shoulders primarily" (05/20/2013)  . CAD (coronary artery disease)    a. 05/2013 - Chest pain/unstable angina and dynamic EKG changes showing cath showing atherosclerotic coronary artery disease manifested as diffuse ectasia, normal EF.  . Diverticulosis of colon   . GERD (gastroesophageal reflux disease)   . GI bleed    secondary to Aspirin  . Gout   . Hematuria, microscopic   . Hemorrhoids   . Hyperglycemia   . Hyperlipidemia   . Hypertension   . Lumbar back pain   . Microalbuminuria   . Overweight(278.02)   . Polycythemia [place]ra [place]a [place]   . Tubular adenoma of colon 07/2012    Social History   Social History  . Marital status: Married    Spouse name: N/A  . Number of children: 3  . Years of education: N/A   Occupational History  . retired eChief Financial OfficerRetired   Social History Main Topics  . Smoking status: Never Smoker  . Smokeless tobacco: Never Used     Comment: NEVER USED TOBACCO  . Alcohol use No  . Drug use: No  . Sexual activity: Yes    Partners: Female   Other Topics Concern  . Not on file   Social History Narrative   Marriedfor 584years    Daughter, MCorky Sox(pediatrician[place], [place]- [place]Engineer[place]- AChief Strategy Officer             Past Surgical History:  Procedure Laterality Date  . CARDIAC CATHETERIZATION  05/20/2013  . CATARACT EXTRACTION W/ INTRAOCULAR LENS  IMPLANT, BILATERAL Bilateral 2008  . CYSTECTOMY  ~  2000   " SEBACEOUS cyst removed from between shoulder blades"  . EXCISIONAL HEMORRHOIDECTOMY  1970's  . INGUINAL HERNIA REPAIR Right ~ 2010  . LEFT HEART CATHETERIZATION WITH CORONARY ANGIOGRAM N/A 05/20/2013   Procedure: LEFT HEART CATHETERIZATION WITH CORONARY ANGIOGRAM;  Surgeon: Peter M Martinique, MD;  Location: [name] CATH LAB;  Service: Cardiovascular;  Laterality: N/A;  . TONSILLECTOMY AND ADENOIDECTOMY  1940's  . VASECTOMY      Family History  Problem Relation  Age of Onset  . Colon cancer Mother        died at 57, colorectal  . Diabetes Mother   . Heart disease Father 35       MI  . Diabetes Father   . Stomach cancer Maternal Grandfather   . Cancer Maternal Uncle     Allergies  Allergen Reactions  . Metformin And Related Diarrhea    Current Outpatient Prescriptions on File Prior to Visit  Medication Sig Dispense Refill  . ACCU-CHEK SOFTCLIX LANCETS lancets Use to test blood glucose once daily 100 each 3  . allopurinol (ZYLOPRIM) 100 MG tablet TAKE 1 TABLET BY MOUTH ONCE DAILY 90 tablet 0  . aspirin EC 81 MG tablet Take 1 tablet (81 mg total) by mouth daily. 90 tablet 3  . azelastine (ASTELIN) 0.1 % nasal spray Place 1 spray into both nostrils 2 (two) times daily. Use in each nostril as directed 30 mL 3  . Blood Glucose Monitoring Suppl (ACCU-CHEK AVIVA PLUS) w/Device KIT Use to test blood glucose once daily 1 kit 0  . Coenzyme Q10 300 MG CAPS Take 1 capsule by mouth daily.     . diphenhydrAMINE (BENADRYL) 2 % cream Apply topically 3 (three) times daily as needed for itching.    [name] glucose blood (ACCU-CHEK AVIVA) test strip Use to test blood glucose once daily 100 each 3  . hydrocortisone (ANUCORT-HC) 25 MG suppository Take as directed 120 suppository 0  . hydrocortisone (PROCTOZONE-HC) 2.5 % rectal cream Place rectally 2 (two) times daily. 60 g 3  . losartan (COZAAR) 100 MG tablet Take 1 tablet (100 mg total) by mouth daily. 90 tablet 3  . pantoprazole (PROTONIX) 40 MG tablet Take 1 tablet by mouth  daily 90 tablet 1  . pravastatin (PRAVACHOL) 40 MG tablet Take 1 tablet (40 mg total) by mouth daily. 90 tablet 3  . prednisoLONE acetate (PRED FORTE) 1 % ophthalmic suspension     . terbinafine (LAMISIL) 250 MG tablet Take 1 tablet (250 mg total) by mouth daily. 90 tablet 0  . triamcinolone (NASACORT) 55 MCG/ACT AERO nasal inhaler Place 2 sprays into the nose daily. 1 Inhaler 12   No current facility-administered medications on file prior to  visit.     BP 120/80 (BP Location: Left Arm)   Temp 97.9 F (36.6 C) (Oral)   Ht 5' 9.75" (1.772 m)   Wt 183 lb (83 kg)   BMI 26.45 kg/m       Objective:   Physical Exam  Constitutional: He is oriented to person, place, and time. He appears well-developed and well-nourished. No distress.  HENT:  Head: Normocephalic and atraumatic.  Right Ear: External ear normal.  Left Ear: External ear normal.  Nose: Nose normal.  Mouth/Throat: Oropharynx is clear and moist. No oropharyngeal exudate.  Eyes: Pupils are equal, round, and reactive to light. Conjunctivae and EOM are normal. Right eye exhibits no discharge. Left eye exhibits no discharge. No scleral icterus.  Neck: Normal range of motion. Neck  supple. No JVD present. Carotid bruit is not present. No tracheal deviation present. No thyroid mass and no thyromegaly present.  Cardiovascular: Normal rate, regular rhythm, normal heart sounds and intact distal pulses.  Exam reveals no gallop and no friction rub.   No murmur heard. Pulmonary/Chest: Effort normal and breath sounds normal. No stridor. No respiratory distress. He has no wheezes. He has no rales. He exhibits no tenderness.  Abdominal: Soft. Bowel sounds are normal. He exhibits no distension and no mass. There is no tenderness. There is no rebound and no guarding.  Musculoskeletal: Normal range of motion. He exhibits no edema, tenderness or deformity.  Lymphadenopathy:    He has no cervical adenopathy.  Neurological: He is alert and oriented to person, place, and time. He has normal reflexes. He displays normal reflexes. No cranial nerve deficit. He exhibits normal muscle tone. Coordination normal.  Skin: Skin is warm and dry. No rash noted. He is not diaphoretic. No erythema. No pallor.  Surgical scar on knee   Psychiatric: He has a normal mood and affect. His behavior is normal. Judgment and thought content normal.  Nursing note and vitals reviewed.     Assessment & Plan:  1.  Routine general medical examination at a health care facility - Continue to eat a heart healthy diet and exercise - Follow up in one year or sooner if needed - High dose flu given during this visit  - Basic metabolic panel - CBC with Differential/Platelet - Hemoglobin A1c - Hepatic function panel - Lipid panel - PSA - TSH  2. Essential hypertension - Well controlled. No change in medication at this time   3. Prediabetes - Continue to eat healthy and exercise  - Consider adding medication  - Basic metabolic panel - CBC with Differential/Platelet - Hemoglobin A1c - Hepatic function panel - Lipid panel - PSA - TSH  4. Hyperlipidemia, unspecified hyperlipidemia type  - Basic metabolic panel - CBC with Differential/Platelet - Hemoglobin A1c - Hepatic function panel - Lipid panel - PSA - TSH  5. Gastroesophageal reflux disease without esophagitis - Continue with protonix  - Basic metabolic panel - CBC with Differential/Platelet - Hemoglobin A1c - Hepatic function panel - Lipid panel - PSA - TSH  6. Coronary artery disease due to lipid rich plaque - Continue statin. Consider dose adjustment  - Basic metabolic panel - CBC with Differential/Platelet - Hemoglobin A1c - Hepatic function panel - Lipid panel - PSA - TSH   [name], NP

## 2016-09-22 ENCOUNTER — Encounter: Payer: Self-pay | Admitting: Adult Health

## 2016-09-26 ENCOUNTER — Other Ambulatory Visit: Payer: Self-pay | Admitting: *Deleted

## 2016-09-26 DIAGNOSIS — D45 Polycythemia vera: Secondary | ICD-10-CM

## 2016-09-27 ENCOUNTER — Other Ambulatory Visit (HOSPITAL_BASED_OUTPATIENT_CLINIC_OR_DEPARTMENT_OTHER): Payer: Medicare HMO

## 2016-09-27 ENCOUNTER — Ambulatory Visit (HOSPITAL_BASED_OUTPATIENT_CLINIC_OR_DEPARTMENT_OTHER): Payer: Medicare HMO | Admitting: Hematology & Oncology

## 2016-09-27 VITALS — BP 137/75 | HR 50 | Temp 98.4°F | Resp 18 | Wt 180.0 lb

## 2016-09-27 DIAGNOSIS — D45 Polycythemia vera: Secondary | ICD-10-CM

## 2016-09-27 LAB — CMP (CANCER CENTER ONLY)
ALK PHOS: 68 U/L (ref 26–84)
ALT(SGPT): 20 U/L (ref 10–47)
AST: 22 U/L (ref 11–38)
Albumin: 3.8 g/dL (ref 3.3–5.5)
BILIRUBIN TOTAL: 0.7 mg/dL (ref 0.20–1.60)
BUN: 22 mg/dL (ref 7–22)
CO2: 32 mEq/L (ref 18–33)
Calcium: 9.6 mg/dL (ref 8.0–10.3)
Chloride: 101 mEq/L (ref 98–108)
Creat: 1.7 mg/dl — ABNORMAL HIGH (ref 0.6–1.2)
GLUCOSE: 120 mg/dL — AB (ref 73–118)
Potassium: 4.8 mEq/L — ABNORMAL HIGH (ref 3.3–4.7)
SODIUM: 139 meq/L (ref 128–145)
Total Protein: 7.1 g/dL (ref 6.4–8.1)

## 2016-09-27 LAB — CBC WITH DIFFERENTIAL (CANCER CENTER ONLY)
BASO#: 0 10*3/uL (ref 0.0–0.2)
BASO%: 0.5 % (ref 0.0–2.0)
EOS ABS: 0.2 10*3/uL (ref 0.0–0.5)
EOS%: 3.2 % (ref 0.0–7.0)
HEMATOCRIT: 44 % (ref 38.7–49.9)
HEMOGLOBIN: 14.3 g/dL (ref 13.0–17.1)
LYMPH#: 1.8 10*3/uL (ref 0.9–3.3)
LYMPH%: 23.6 % (ref 14.0–48.0)
MCH: 28.8 pg (ref 28.0–33.4)
MCHC: 32.5 g/dL (ref 32.0–35.9)
MCV: 89 fL (ref 82–98)
MONO#: 1 10*3/uL — ABNORMAL HIGH (ref 0.1–0.9)
MONO%: 12.7 % (ref 0.0–13.0)
NEUT#: 4.5 10*3/uL (ref 1.5–6.5)
NEUT%: 60 % (ref 40.0–80.0)
Platelets: 204 10*3/uL (ref 145–400)
RBC: 4.97 10*6/uL (ref 4.20–5.70)
RDW: 16.6 % — AB (ref 11.1–15.7)
WBC: 7.6 10*3/uL (ref 4.0–10.0)

## 2016-09-27 LAB — LACTATE DEHYDROGENASE: LDH: 139 U/L (ref 125–245)
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
## 2016-09-28 LAB — IRON AND TIBC
%SAT: 9 % — ABNORMAL LOW (ref 20–55)
Iron: 33 ug/dL — ABNORMAL LOW (ref 42–163)
TIBC: 353 ug/dL (ref 202–409)
UIBC: 320 ug/dL (ref 117–376)

## 2016-09-28 LAB — FERRITIN: FERRITIN: 25 ng/mL (ref 22–316)

## 2016-10-03 ENCOUNTER — Encounter: Payer: Self-pay | Admitting: Adult Health

## 2016-10-05 DIAGNOSIS — H532 Diplopia: Secondary | ICD-10-CM | POA: Diagnosis not present

## 2016-10-21 ENCOUNTER — Other Ambulatory Visit: Payer: Self-pay | Admitting: Adult Health

## 2016-10-24 NOTE — Telephone Encounter (Signed)
Sent to the pharmacy by e-scribe. 

## 2016-11-12 ENCOUNTER — Encounter: Payer: Self-pay | Admitting: Adult Health

## 2016-11-29 ENCOUNTER — Encounter: Payer: Self-pay | Admitting: Hematology & Oncology

## 2016-11-29 ENCOUNTER — Ambulatory Visit (HOSPITAL_BASED_OUTPATIENT_CLINIC_OR_DEPARTMENT_OTHER): Payer: Medicare HMO | Admitting: Hematology & Oncology

## 2016-11-29 ENCOUNTER — Other Ambulatory Visit (HOSPITAL_BASED_OUTPATIENT_CLINIC_OR_DEPARTMENT_OTHER): Payer: Medicare HMO

## 2016-11-29 ENCOUNTER — Other Ambulatory Visit: Payer: Self-pay

## 2016-11-29 VITALS — BP 162/81 | HR 51 | Temp 98.3°F | Resp 18 | Wt 185.0 lb

## 2016-11-29 DIAGNOSIS — D45 Polycythemia vera: Secondary | ICD-10-CM

## 2016-11-29 LAB — CMP (CANCER CENTER ONLY)
ALT(SGPT): 20 U/L (ref 10–47)
AST: 22 U/L (ref 11–38)
Albumin: 3.6 g/dL (ref 3.3–5.5)
Alkaline Phosphatase: 92 U/L — ABNORMAL HIGH (ref 26–84)
BUN: 22 mg/dL (ref 7–22)
CHLORIDE: 102 meq/L (ref 98–108)
CO2: 30 meq/L (ref 18–33)
CREATININE: 1.2 mg/dL (ref 0.6–1.2)
Calcium: 9.3 mg/dL (ref 8.0–10.3)
Glucose, Bld: 105 mg/dL (ref 73–118)
Potassium: 4.2 mEq/L (ref 3.3–4.7)
SODIUM: 144 meq/L (ref 128–145)
TOTAL PROTEIN: 7.1 g/dL (ref 6.4–8.1)
Total Bilirubin: 0.9 mg/dl (ref 0.20–1.60)

## 2016-11-29 LAB — CBC WITH DIFFERENTIAL (CANCER CENTER ONLY)
BASO#: 0 10*3/uL (ref 0.0–0.2)
BASO%: 0.4 % (ref 0.0–2.0)
EOS ABS: 0.3 10*3/uL (ref 0.0–0.5)
EOS%: 2.7 % (ref 0.0–7.0)
HCT: 44.3 % (ref 38.7–49.9)
HGB: 14.8 g/dL (ref 13.0–17.1)
LYMPH#: 1.8 10*3/uL (ref 0.9–3.3)
LYMPH%: 17.9 % (ref 14.0–48.0)
MCH: 29.3 pg (ref 28.0–33.4)
MCHC: 33.4 g/dL (ref 32.0–35.9)
MCV: 88 fL (ref 82–98)
MONO#: 1.2 10*3/uL — ABNORMAL HIGH (ref 0.1–0.9)
MONO%: 11.7 % (ref 0.0–13.0)
NEUT#: 6.8 10*3/uL — ABNORMAL HIGH (ref 1.5–6.5)
NEUT%: 67.3 % (ref 40.0–80.0)
PLATELETS: 206 10*3/uL (ref 145–400)
RBC: 5.05 10*6/uL (ref 4.20–5.70)
RDW: 15.8 % — ABNORMAL HIGH (ref 11.1–15.7)
WBC: 10 10*3/uL (ref 4.0–10.0)

## 2016-11-29 LAB — IRON AND TIBC
%SAT: 19 % — AB (ref 20–55)
Iron: 67 ug/dL (ref 42–163)
TIBC: 359 ug/dL (ref 202–409)
UIBC: 292 ug/dL (ref 117–376)

## 2016-11-29 LAB — FERRITIN: FERRITIN: 23 ng/mL (ref 22–316)

## 2016-11-29 NOTE — Progress Notes (Signed)
Hematology and Oncology Follow Up Visit  Alexander [name] [id number] April 24, 1940 76 y.o. 11/29/2016   Principle Diagnosis:  Polycythemia vera-JAK2 negative  Current Therapy:    Phlebotomy to maintain hematocrit below 45% - 12/29/2015  Aspirin 81 mg by mouth daily     Interim History:  [name] is in for a followup.  As always, he comes in with his wife.  They are looking forward to [facility].  It will be this Sunday.  They had a very nice Thanksgiving.  He has had no complaints.  He has had no problems with headache.  He still had that transient double vision.  This was with his left eye.  I am not sure as to what triggered this.  He has had a very thorough evaluation.  He has had no nausea or vomiting.  He has had no tinnitus.  He has had no change in bowel or bladder habits.  With the weather being colder, he is not been able to exercise as much.  His iron studies done back in September showed a ferritin of 25 with an iron saturation of 9%.   Overall, his performance status is ECOG 1.  Medications:  Current Outpatient Medications:  .  ACCU-CHEK SOFTCLIX LANCETS lancets, Use to test blood glucose once daily, Disp: 100 each, Rfl: 3 .  allopurinol (ZYLOPRIM) 100 MG tablet, TAKE 1 TABLET BY MOUTH ONCE DAILY, Disp: 90 tablet, Rfl: 2 .  aspirin EC 81 MG tablet, Take 1 tablet (81 mg total) by mouth daily., Disp: 90 tablet, Rfl: 3 .  azelastine (ASTELIN) 0.1 % nasal spray, Place 1 spray into both nostrils 2 (two) times daily. Use in each nostril as directed, Disp: 30 mL, Rfl: 3 .  Blood Glucose Monitoring Suppl (ACCU-CHEK AVIVA PLUS) w/Device KIT, Use to test blood glucose once daily, Disp: 1 kit, Rfl: 0 .  Coenzyme Q10 300 MG CAPS, Take 1 capsule by mouth daily. , Disp: , Rfl:  .  diphenhydrAMINE (BENADRYL) 2 % cream, Apply topically 3 (three) times daily as needed for itching., Disp: , Rfl:  .  glucose blood (ACCU-CHEK AVIVA) test strip, Use to test blood glucose once daily,  Disp: 100 each, Rfl: 3 .  hydrocortisone (ANUCORT-HC) 25 MG suppository, Take as directed, Disp: 120 suppository, Rfl: 0 .  hydrocortisone (PROCTOZONE-HC) 2.5 % rectal cream, Place rectally 2 (two) times daily., Disp: 60 g, Rfl: 3 .  losartan (COZAAR) 100 MG tablet, Take 1 tablet (100 mg total) by mouth daily., Disp: 90 tablet, Rfl: 3 .  pantoprazole (PROTONIX) 40 MG tablet, Take 1 tablet by mouth  daily, Disp: 90 tablet, Rfl: 1 .  pravastatin (PRAVACHOL) 40 MG tablet, Take 1 tablet (40 mg total) by mouth daily., Disp: 90 tablet, Rfl: 3 .  prednisoLONE acetate (PRED FORTE) 1 % ophthalmic suspension, , Disp: , Rfl:  .  triamcinolone (NASACORT) 55 MCG/ACT AERO nasal inhaler, Place 2 sprays into the nose daily., Disp: 1 Inhaler, Rfl: 12  Allergies:  Allergies  Allergen Reactions  . Metformin And Related Diarrhea    Past Medical History, Surgical history, Social history, and Family History were reviewed and updated.  Review of Systems: As stated in the interim history  Physical Exam:  weight is 185 lb (83.9 kg). His oral temperature is 98.3 F (36.8 C). His blood pressure is 162/81 (abnormal) and his pulse is 51 (abnormal). His respiration is 18 and oxygen saturation is 100%.   I examined Alexander [name]. The results of the  examination are listed below with appropriate corrections:   Well-developed and well-nourished gentleman. Head and neck exam shows no ocular or oral lesions. He has no palpable cervical or supraclavicular lymph nodes. His lungs are clear with no rales, wheezes or rhonchi. Cardiac exam regular rate and rhythm with no murmurs, rubs or bruits.. Abdomen is soft. He has good bowel sounds. There is no fluid wave. There is no palpable liver or spleen tip. Back exam shows no tenderness over the spine, ribs or hips. Extremities shows no clubbing cyanosis or edema. He has good range of motion of his joints. Has good strength in his extremities. Skin exam no rashes, ecchymoses or  petechia. Neurological exam is nonfocal.  Of note, I repeated his blood pressure with a manual cuff. His blood pressure was 144/84.  Lab Results  Component Value Date   WBC 10.0 11/29/2016   HGB 14.8 11/29/2016   HCT 44.3 11/29/2016   MCV 88 11/29/2016   PLT 206 11/29/2016     Chemistry      Component Value Date/Time   NA 144 11/29/2016 1054   NA 139 12/29/2015 0954   K 4.2 11/29/2016 1054   K 4.1 12/29/2015 0954   CL 102 11/29/2016 1054   CO2 30 11/29/2016 1054   CO2 26 12/29/2015 0954   BUN 22 11/29/2016 1054   BUN 20.0 12/29/2015 0954   CREATININE 1.2 11/29/2016 1054   CREATININE 1.3 12/29/2015 0954      Component Value Date/Time   CALCIUM 9.3 11/29/2016 1054   CALCIUM 9.5 12/29/2015 0954   ALKPHOS 92 (H) 11/29/2016 1054   ALKPHOS 98 12/29/2015 0954   AST 22 11/29/2016 1054   AST 16 12/29/2015 0954   ALT 20 11/29/2016 1054   ALT 13 12/29/2015 0954   BILITOT 0.90 11/29/2016 1054   BILITOT 0.71 12/29/2015 0954      Impression and Plan: Alexander [name] is 76 year old gentleman. He has polycythemia. He is JAK2 negative however.  For right now, we do not have to phlebotomize him.  I would like to see him back in 2 months.  He and his wife will be going over to [place] in January for a grandsons wedding.  I do not see any problems with him doing that.  [name], MD 11/28/201812:50 PM

## 2016-11-30 ENCOUNTER — Ambulatory Visit (INDEPENDENT_AMBULATORY_CARE_PROVIDER_SITE_OTHER): Payer: Medicare HMO | Admitting: Cardiology

## 2016-11-30 ENCOUNTER — Encounter: Payer: Self-pay | Admitting: Cardiology

## 2016-11-30 VITALS — BP 142/90 | HR 52 | Ht 69.75 in | Wt 185.8 lb

## 2016-11-30 DIAGNOSIS — D45 Polycythemia vera: Secondary | ICD-10-CM | POA: Diagnosis not present

## 2016-11-30 DIAGNOSIS — I10 Essential (primary) hypertension: Secondary | ICD-10-CM | POA: Diagnosis not present

## 2016-11-30 DIAGNOSIS — I2583 Coronary atherosclerosis due to lipid rich plaque: Secondary | ICD-10-CM

## 2016-11-30 DIAGNOSIS — I2510 Atherosclerotic heart disease of native coronary artery without angina pectoris: Secondary | ICD-10-CM

## 2016-11-30 MED ORDER — AMLODIPINE BESYLATE 5 MG PO TABS: 5.0000 mg | ORAL_TABLET | Freq: Every day | ORAL | 3 refills | Status: DC

## 2016-11-30 NOTE — Progress Notes (Signed)
Cardiology Office Note   Date:  11/30/2016   ID:  Alexander [name], DOB [birth year], MRN [id number]  PCP:  [name], NP  Cardiologist:  Dr. Candee Furbish      History of Present Illness: Alexander [name] is a 76 y.o. male with a hx of polycythemia vera, HTN, GERD, HL, CKD, prior GI bleed.  In 2015 LHC demonstrated atherosclerotic CAD manifested as diffuse ectasia but no significant obstructive disease was noted. Had 30% left main plaque.  EF was normal.  Med Rx was recommended.    Valsartan changed to losartan.    Prior event monitor reassuring, no atrial fibrillation. Asymptomatic bradycardia.  Overall he is feeling great. He travels to [place] almost every year. Daughter there.  Continues to swim in his saline pool at home during the summertime.  Doing well without any chest pain shortness of breath fevers chills nausea.  Studies:  - LHC (05/20/13):  Proximal left main 30%, mildly ectatic distal left main, proximal LAD with mild ectasia, proximal circumflex with moderate ectasia, proximal, mid and distal RCA with severe ectasia, EF 55-65%.    - Echo (10/2008):  EF 60-65%, Gr 1 DD  - Nuclear (12/2012):  Normal study, EF 59%   CXR (05/20/13): IMPRESSION: The nodular opacity at the right lung base does not correspond with a nipple marker and is concerning for a pulmonary nodule. Recommend further evaluation with a nonemergent CT of the chest.   Chest CT (06/05/13): IMPRESSION: 1. No suspicious lung nodule is seen. The area questioned by chest x-ray probably represents focal thickening along the major fissure adjacent to the right middle lobe anteriorly and laterally. 2. No mediastinal or hilar adenopathy.   Recent Labs: 08/31/2016: HDL 45.60; LDL Cholesterol 111; TSH 0.95 11/29/2016: ALT(SGPT) 20; Creat 1.2; HGB 14.8; Potassium 4.2  Wt Readings from Last 3 Encounters:  11/30/16 185 lb 12.8 oz (84.3 kg)  11/29/16 185 lb (83.9 kg)  09/27/16 180 lb (81.6 kg)     Past  Medical History:  Diagnosis Date  . Arthritis    "minor; shoulders primarily" (05/20/2013)  . CAD (coronary artery disease)    a. 05/2013 - Chest pain/unstable angina and dynamic EKG changes showing cath showing atherosclerotic coronary artery disease manifested as diffuse ectasia, normal EF.  . Diverticulosis of colon   . GERD (gastroesophageal reflux disease)   . GI bleed    secondary to Aspirin  . Gout   . Hematuria, microscopic   . Hemorrhoids   . Hyperglycemia   . Hyperlipidemia   . Hypertension   . Lumbar back pain   . Microalbuminuria   . Overweight(278.02)   . Polycythemia rubra vera ([place])   . Tubular adenoma of colon 07/2012    Current Outpatient Medications  Medication Sig Dispense Refill  . ACCU-CHEK SOFTCLIX LANCETS lancets Use to test blood glucose once daily 100 each 3  . allopurinol (ZYLOPRIM) 100 MG tablet TAKE 1 TABLET BY MOUTH ONCE DAILY 90 tablet 2  . aspirin EC 81 MG tablet Take 1 tablet (81 mg total) by mouth daily. 90 tablet 3  . azelastine (ASTELIN) 0.1 % nasal spray Place 1 spray into both nostrils 2 (two) times daily. Use in each nostril as directed 30 mL 3  . Blood Glucose Monitoring Suppl (ACCU-CHEK AVIVA PLUS) w/Device KIT Use to test blood glucose once daily 1 kit 0  . Coenzyme Q10 300 MG CAPS Take 1 capsule by mouth daily.     . diphenhydrAMINE (BENADRYL) 2 % cream  Apply topically 3 (three) times daily as needed for itching.    [name] glucose blood (ACCU-CHEK AVIVA) test strip Use to test blood glucose once daily 100 each 3  . hydrocortisone (ANUCORT-HC) 25 MG suppository Take as directed 120 suppository 0  . hydrocortisone (PROCTOZONE-HC) 2.5 % rectal cream Place rectally 2 (two) times daily. 60 g 3  . losartan (COZAAR) 100 MG tablet Take 1 tablet (100 mg total) by mouth daily. 90 tablet 3  . pantoprazole (PROTONIX) 40 MG tablet Take 1 tablet by mouth  daily 90 tablet 1  . pravastatin (PRAVACHOL) 40 MG tablet Take 1 tablet (40 mg total) by mouth daily. 90  tablet 3  . prednisoLONE acetate (PRED FORTE) 1 % ophthalmic suspension     . triamcinolone (NASACORT) 55 MCG/ACT AERO nasal inhaler Place 2 sprays into the nose daily. 1 Inhaler 12  . amLODipine (NORVASC) 5 MG tablet Take 1 tablet (5 mg total) by mouth daily. 180 tablet 3   No current facility-administered medications for this visit.     Allergies:   Metformin and related   Social History:  The patient  reports that  has never smoked. he has never used smokeless tobacco. He reports that he does not drink alcohol or use drugs.   Family History:  The patient's family history includes Cancer in his maternal uncle; Colon cancer in his mother; Diabetes in his father and mother; Heart disease (age of onset: 63) in his father; Stomach cancer in his maternal grandfather.   ROS:  Please see the history of present illness.     Easy bruising. All other systems reviewed and negative.   PHYSICAL EXAM: VS:  BP (!) 142/90   Pulse (!) 52   Ht 5' 9.75" (1.772 m)   Wt 185 lb 12.8 oz (84.3 kg)   SpO2 96%   BMI 26.85 kg/m  GEN: Well nourished, well developed, in no acute distress  HEENT: normal  Neck: no JVD, carotid bruits, or masses Cardiac: RRR; no murmurs, rubs, or gallops,no edema  Respiratory:  clear to auscultation bilaterally, normal work of breathing GI: soft, nontender, nondistended, + BS MS: no deformity or atrophy  Skin: warm and dry, no rash Neuro:  Alert and Oriented x 3, Strength and sensation are intact Psych: euthymic mood, full affect   EKG:  EKG ordered today- 11/30/16-sinus bradycardia heart rate 52 with no other significant abnormalities.  Sinus bradycardia rate 49 with nonspecific ST-T wave changes personally viewed-prior Sinus brady, HR 48, NSSTTW changes     ASSESSMENT AND PLAN:  1. CAD (coronary artery disease): I'm fine with him taking low-dose aspirin. Diffuse ectasia noted on cardiac catheterization without significant obstructive disease. Continue medical therapy  with aspirin,off beta blocker-bradycardia, felt poor-I'm fine with this especially given bradycardia, statin. He has had no further chest pain. Doing well. Continue with exercise. Swimming only in summer. Consider stationary bike at home as he has during winter. His wife struggles with right knee pain.  Feeling well.  2. HYPERTENSION: Was on valsartan but this was recalled.  He was switched to losartan now at 100 but his blood pressures not well controlled.  In the past he had been on amlodipine and did well.  We will restart 5 mg of amlodipine. 3. HYPERLIPIDEMIA: Doing very well with pravastatin.  Doing well. 4. POLYCYTHEMIA RUBRA VERA: Continue followup with hematology. Easy bruising. He has not had blood removal in quite some time.  Continue with aspirin as well. 5. Pulmonary nodule: Followup chest  CT without lung nodule. No further workup planned. 6. Bradycardia: He is asymptomatic. Stay off beta blocker. Continue current therapy.  No changes 7. Disposition:  One-year follow-up.   Signed, Candee Furbish, MD  11/30/2016 3:42 PM    [facility] [place], [place], [place]  [address] Phone: [phone]; Fax: [phone]

## 2016-11-30 NOTE — Patient Instructions (Signed)
Medication Instructions:  Please start Amlodipine 5 mg a day. Continue all other medications as listed.  Follow-Up: Follow up in 1 year with Dr. Skains.  You will receive a letter in the mail 2 months before you are due.  Please call us when you receive this letter to schedule your follow up appointment.  If you need a refill on your cardiac medications before your next appointment, please call your pharmacy.  Thank you for choosing [facility]!!     

## 2016-12-07 ENCOUNTER — Encounter: Payer: Self-pay | Admitting: Adult Health

## 2016-12-07 DIAGNOSIS — E118 Type 2 diabetes mellitus with unspecified complications: Secondary | ICD-10-CM | POA: Diagnosis not present

## 2016-12-15 ENCOUNTER — Other Ambulatory Visit: Payer: Self-pay | Admitting: Adult Health

## 2016-12-15 NOTE — Telephone Encounter (Signed)
Sent to the pharmacy by e-scribe. 

## 2016-12-20 ENCOUNTER — Encounter: Payer: Self-pay | Admitting: Adult Health

## 2016-12-21 ENCOUNTER — Ambulatory Visit (INDEPENDENT_AMBULATORY_CARE_PROVIDER_SITE_OTHER): Payer: Medicare HMO | Admitting: Adult Health

## 2016-12-21 ENCOUNTER — Encounter: Payer: Self-pay | Admitting: Adult Health

## 2016-12-21 VITALS — BP 158/86 | HR 74 | Temp 98.3°F | Ht 69.0 in | Wt 183.7 lb

## 2016-12-21 DIAGNOSIS — I10 Essential (primary) hypertension: Secondary | ICD-10-CM

## 2016-12-21 NOTE — Progress Notes (Signed)
Subjective:    Patient ID: Alexander [name], male    DOB: 1940-04-04, 77 y.o.   MRN: [id number]  HPI  76 year old male who  has a past medical history of Arthritis, CAD (coronary artery disease), Diverticulosis of colon, GERD (gastroesophageal reflux disease), GI bleed, Gout, Hematuria, microscopic, Hemorrhoids, Hyperglycemia, Hyperlipidemia, Hypertension, Lumbar back pain, Microalbuminuria, Overweight(278.02), Polycythemia rubra vera ([place]), and Tubular adenoma of colon (07/2012).  He presents to the office today for follow up regarding blood pressure. He was recently seen by Cardiology. His BP was not controlled on Losartan ( was switched due to recall of Valsartan). Dr. Marlou Porch placed Purcell Nails on Norvasc 5 mg and was advised to follow up with me.   He has been checking his BP at home and have had readings in the 150-160's/80's.   He feels well overall.   Review of Systems See HPI   Past Medical History:  Diagnosis Date  . Arthritis    "minor; shoulders primarily" (05/20/2013)  . CAD (coronary artery disease)    a. 05/2013 - Chest pain/unstable angina and dynamic EKG changes showing cath showing atherosclerotic coronary artery disease manifested as diffuse ectasia, normal EF.  . Diverticulosis of colon   . GERD (gastroesophageal reflux disease)   . GI bleed    secondary to Aspirin  . Gout   . Hematuria, microscopic   . Hemorrhoids   . Hyperglycemia   . Hyperlipidemia   . Hypertension   . Lumbar back pain   . Microalbuminuria   . Overweight(278.02)   . Polycythemia rubra vera ([place])   . Tubular adenoma of colon 07/2012    Social History   Socioeconomic History  . Marital status: Married    Spouse name: Not on file  . Number of children: 3  . Years of education: Not on file  . Highest education level: Not on file  Social Needs  . Financial resource strain: Not on file  . Food insecurity - worry: Not on file  . Food insecurity - inability: Not on file  .  Transportation needs - medical: Not on file  . Transportation needs - non-medical: Not on file  Occupational History  . Occupation: retired Lobbyist: RETIRED  Tobacco Use  . Smoking status: Never Smoker  . Smokeless tobacco: Never Used  . Tobacco comment: NEVER USED TOBACCO  Substance and Sexual Activity  . Alcohol use: No    Alcohol/week: 0.0 oz  . Drug use: No  . Sexual activity: Yes    Partners: Female  Other Topics Concern  . Not on file  Social History Narrative   Marriedfor 18 years    Daughter, [name] (pediatrician--lives in [place], [place]), one in [place] - Engineer, water. One in [place] - Chief Strategy Officer           Past Surgical History:  Procedure Laterality Date  . CARDIAC CATHETERIZATION  05/20/2013  . CATARACT EXTRACTION W/ INTRAOCULAR LENS  IMPLANT, BILATERAL Bilateral 2008  . CYSTECTOMY  ~ 2000   " SEBACEOUS cyst removed from between shoulder blades"  . EXCISIONAL HEMORRHOIDECTOMY  1970's  . INGUINAL HERNIA REPAIR Right ~ 2010  . LEFT HEART CATHETERIZATION WITH CORONARY ANGIOGRAM N/A 05/20/2013   Procedure: LEFT HEART CATHETERIZATION WITH CORONARY ANGIOGRAM;  Surgeon: Peter M Martinique, MD;  Location: [facility] CATH LAB;  Service: Cardiovascular;  Laterality: N/A;  . TONSILLECTOMY AND ADENOIDECTOMY  1940's  . VASECTOMY      Family History  Problem Relation Age of Onset  .  Colon cancer Mother        died at 41, colorectal  . Diabetes Mother   . Heart disease Father 51       MI  . Diabetes Father   . Stomach cancer Maternal Grandfather   . Cancer Maternal Uncle     Allergies  Allergen Reactions  . Metformin And Related Diarrhea    Current Outpatient Medications on File Prior to Visit  Medication Sig Dispense Refill  . ACCU-CHEK SOFTCLIX LANCETS lancets Use to test blood glucose once daily 100 each 3  . allopurinol (ZYLOPRIM) 100 MG tablet TAKE 1 TABLET BY MOUTH ONCE DAILY 90 tablet 2  . amLODipine (NORVASC) 5 MG tablet Take 1 tablet (5 mg total) by mouth daily.  180 tablet 3  . aspirin EC 81 MG tablet Take 1 tablet (81 mg total) by mouth daily. 90 tablet 3  . azelastine (ASTELIN) 0.1 % nasal spray Place 1 spray into both nostrils 2 (two) times daily. Use in each nostril as directed 30 mL 3  . Blood Glucose Monitoring Suppl (ACCU-CHEK AVIVA PLUS) w/Device KIT Use to test blood glucose once daily 1 kit 0  . Coenzyme Q10 300 MG CAPS Take 1 capsule by mouth daily.     . diphenhydrAMINE (BENADRYL) 2 % cream Apply topically 3 (three) times daily as needed for itching.    [name] glucose blood (ACCU-CHEK AVIVA) test strip Use to test blood glucose once daily 100 each 3  . hydrocortisone (ANUCORT-HC) 25 MG suppository Take as directed 120 suppository 0  . hydrocortisone (PROCTOZONE-HC) 2.5 % rectal cream Place rectally 2 (two) times daily. 60 g 3  . pantoprazole (PROTONIX) 40 MG tablet TAKE 1 TABLET BY MOUTH ONCE DAILY 90 tablet 1  . pravastatin (PRAVACHOL) 40 MG tablet Take 1 tablet (40 mg total) by mouth daily. 90 tablet 3  . prednisoLONE acetate (PRED FORTE) 1 % ophthalmic suspension     . triamcinolone (NASACORT) 55 MCG/ACT AERO nasal inhaler Place 2 sprays into the nose daily. 1 Inhaler 12   No current facility-administered medications on file prior to visit.     BP (!) 158/86 (BP Location: Left Arm, Patient Position: Sitting, Cuff Size: Normal)   Pulse 74   Temp 98.3 F (36.8 C) (Oral)   Ht '5\' 9"'  (1.753 m)   Wt 183 lb 11.2 oz (83.3 kg)   SpO2 96%   BMI 27.13 kg/m       Objective:   Physical Exam  Constitutional: He is oriented to person, place, and time. He appears well-developed and well-nourished. No distress.  Cardiovascular: Normal rate, regular rhythm, normal heart sounds and intact distal pulses. Exam reveals no gallop and no friction rub.  No murmur heard. Pulmonary/Chest: Effort normal and breath sounds normal. No respiratory distress. He has no wheezes. He has no rales. He exhibits no tenderness.  Neurological: He is alert and oriented to  person, place, and time.  Skin: Skin is warm and dry. No rash noted. He is not diaphoretic. No erythema. No pallor.  Psychiatric: He has a normal mood and affect. His behavior is normal. Judgment and thought content normal.  Nursing note and vitals reviewed.     Assessment & Plan:  1. Essential hypertension - Increase Norvasc from 5 mg to 10 mg  - [employer] message in 2 weeks with readings.   [name], NP

## 2016-12-22 ENCOUNTER — Encounter: Payer: Self-pay | Admitting: Adult Health

## 2017-01-10 ENCOUNTER — Encounter: Payer: Self-pay | Admitting: Adult Health

## 2017-01-12 ENCOUNTER — Encounter: Payer: Self-pay | Admitting: Adult Health

## 2017-01-16 ENCOUNTER — Encounter: Payer: Self-pay | Admitting: Adult Health

## 2017-01-16 DIAGNOSIS — H1013 Acute atopic conjunctivitis, bilateral: Secondary | ICD-10-CM | POA: Diagnosis not present

## 2017-01-17 ENCOUNTER — Other Ambulatory Visit: Payer: Self-pay | Admitting: Adult Health

## 2017-01-17 MED ORDER — LISINOPRIL 10 MG PO TABS: 10.0000 mg | ORAL_TABLET | Freq: Every day | ORAL | 1 refills | Status: DC

## 2017-01-17 MED ORDER — AMLODIPINE BESYLATE 5 MG PO TABS: 10.0000 mg | ORAL_TABLET | Freq: Every day | ORAL | 3 refills | Status: DC

## 2017-01-19 ENCOUNTER — Encounter: Payer: Self-pay | Admitting: Adult Health

## 2017-01-29 DIAGNOSIS — H1013 Acute atopic conjunctivitis, bilateral: Secondary | ICD-10-CM | POA: Diagnosis not present

## 2017-02-09 ENCOUNTER — Encounter: Payer: Self-pay | Admitting: Adult Health

## 2017-02-09 MED ORDER — AMLODIPINE BESYLATE 10 MG PO TABS: 10.0000 mg | ORAL_TABLET | Freq: Every day | ORAL | 0 refills | Status: DC

## 2017-02-09 MED ORDER — LISINOPRIL 10 MG PO TABS: 10.0000 mg | ORAL_TABLET | Freq: Every day | ORAL | 0 refills | Status: DC

## 2017-02-28 ENCOUNTER — Inpatient Hospital Stay: Payer: Medicare HMO

## 2017-02-28 ENCOUNTER — Inpatient Hospital Stay: Payer: Medicare HMO | Attending: Hematology & Oncology | Admitting: Hematology & Oncology

## 2017-02-28 ENCOUNTER — Other Ambulatory Visit: Payer: Self-pay

## 2017-02-28 VITALS — BP 138/59 | HR 56 | Temp 97.5°F | Resp 18 | Wt 182.5 lb

## 2017-02-28 VITALS — BP 115/61 | HR 60 | Resp 18

## 2017-02-28 DIAGNOSIS — D45 Polycythemia vera: Secondary | ICD-10-CM

## 2017-02-28 LAB — CBC WITH DIFFERENTIAL (CANCER CENTER ONLY)
Basophils Absolute: 0 10*3/uL (ref 0.0–0.1)
Basophils Relative: 0 %
Eosinophils Absolute: 0.3 10*3/uL (ref 0.0–0.5)
Eosinophils Relative: 4 %
HEMATOCRIT: 48.4 % (ref 38.7–49.9)
HEMOGLOBIN: 16.3 g/dL (ref 13.0–17.1)
LYMPHS ABS: 1.7 10*3/uL (ref 0.9–3.3)
LYMPHS PCT: 18 %
MCH: 30 pg (ref 28.0–33.4)
MCHC: 33.7 g/dL (ref 32.0–35.9)
MCV: 89.1 fL (ref 82.0–98.0)
MONOS PCT: 13 %
Monocytes Absolute: 1.2 10*3/uL — ABNORMAL HIGH (ref 0.1–0.9)
NEUTROS PCT: 65 %
Neutro Abs: 6.4 10*3/uL (ref 1.5–6.5)
Platelet Count: 236 10*3/uL (ref 145–400)
RBC: 5.43 MIL/uL (ref 4.20–5.70)
RDW: 16.2 % — ABNORMAL HIGH (ref 11.1–15.7)
WBC: 9.8 10*3/uL (ref 4.0–10.0)

## 2017-02-28 LAB — CMP (CANCER CENTER ONLY)
ALK PHOS: 92 U/L — AB (ref 26–84)
ALT: 23 U/L (ref 10–47)
ANION GAP: 9 (ref 5–15)
AST: 22 U/L (ref 11–38)
Albumin: 3.7 g/dL (ref 3.5–5.0)
BUN: 22 mg/dL (ref 7–22)
CHLORIDE: 106 mmol/L (ref 98–108)
CO2: 31 mmol/L (ref 18–33)
Calcium: 9.5 mg/dL (ref 8.0–10.3)
Creatinine: 1.2 mg/dL (ref 0.60–1.20)
Glucose, Bld: 118 mg/dL (ref 73–118)
POTASSIUM: 4.4 mmol/L (ref 3.3–4.7)
Sodium: 146 mmol/L — ABNORMAL HIGH (ref 128–145)
TOTAL PROTEIN: 7.4 g/dL (ref 6.4–8.1)
Total Bilirubin: 0.8 mg/dL (ref 0.2–1.6)

## 2017-02-28 LAB — IRON AND TIBC
IRON: 58 ug/dL (ref 42–163)
SATURATION RATIOS: 17 % — AB (ref 42–163)
TIBC: 350 ug/dL (ref 202–409)
UIBC: 292 ug/dL

## 2017-02-28 LAB — FERRITIN: FERRITIN: 32 ng/mL (ref 22–316)

## 2017-02-28 NOTE — Progress Notes (Signed)
Hematology and Oncology Follow Up Visit  Alexander [name] [id number] 06/08/1940 77 y.o. 02/28/2017   Principle Diagnosis:  Polycythemia vera-JAK2 negative  Current Therapy:    Phlebotomy to maintain hematocrit below 45% - 12/29/2015  Aspirin 81 mg by mouth daily     Interim History:  [name] is in for a followup.  He is doing pretty well right now.  He and his wife did not go over to [place] for a grandson's wedding.  The cost was incredibly high.  What they did was that the actually brought the grandson and his new bride over to the [place] for visit.  Otherwise, he is doing well.  He has had no cough or shortness of breath.  He has had no nausea or vomiting.  Is had no rashes.  Is had no leg swelling.  He and his wife are on a special low-carb diet.  They are doing quite well.  They are losing weight which is nice to see.\  His last iron studies done back in November showed a ferritin of 23 with iron saturation of 19%.  He has had no fever.  He has tried exercise.  He loves to swim.  Overall, his performance status is [name].  Medications:  Current Outpatient Medications:  .  ACCU-CHEK SOFTCLIX LANCETS lancets, Use to test blood glucose once daily, Disp: 100 each, Rfl: 3 .  allopurinol (ZYLOPRIM) 100 MG tablet, TAKE 1 TABLET BY MOUTH ONCE DAILY, Disp: 90 tablet, Rfl: 2 .  amLODipine (NORVASC) 10 MG tablet, Take 1 tablet (10 mg total) by mouth daily., Disp: 90 tablet, Rfl: 0 .  aspirin EC 81 MG tablet, Take 1 tablet (81 mg total) by mouth daily., Disp: 90 tablet, Rfl: 3 .  azelastine (ASTELIN) 0.1 % nasal spray, Place 1 spray into both nostrils 2 (two) times daily. Use in each nostril as directed, Disp: 30 mL, Rfl: 3 .  Blood Glucose Monitoring Suppl (ACCU-CHEK AVIVA PLUS) w/Device KIT, Use to test blood glucose once daily, Disp: 1 kit, Rfl: 0 .  Coenzyme Q10 300 MG CAPS, Take 1 capsule by mouth daily. , Disp: , Rfl:  .  diphenhydrAMINE (BENADRYL) 2 % cream, Apply  topically 3 (three) times daily as needed for itching., Disp: , Rfl:  .  glucose blood (ACCU-CHEK AVIVA) test strip, Use to test blood glucose once daily, Disp: 100 each, Rfl: 3 .  hydrocortisone (ANUCORT-HC) 25 MG suppository, Take as directed, Disp: 120 suppository, Rfl: 0 .  hydrocortisone (PROCTOZONE-HC) 2.5 % rectal cream, Place rectally 2 (two) times daily., Disp: 60 g, Rfl: 3 .  lisinopril (PRINIVIL,ZESTRIL) 10 MG tablet, Take 1 tablet (10 mg total) by mouth daily., Disp: 90 tablet, Rfl: 0 .  pantoprazole (PROTONIX) 40 MG tablet, TAKE 1 TABLET BY MOUTH ONCE DAILY, Disp: 90 tablet, Rfl: 1 .  pravastatin (PRAVACHOL) 40 MG tablet, Take 1 tablet (40 mg total) by mouth daily., Disp: 90 tablet, Rfl: 3 .  prednisoLONE acetate (PRED FORTE) 1 % ophthalmic suspension, , Disp: , Rfl:  .  triamcinolone (NASACORT) 55 MCG/ACT AERO nasal inhaler, Place 2 sprays into the nose daily., Disp: 1 Inhaler, Rfl: 12  Allergies:  Allergies  Allergen Reactions  . Metformin And Related Diarrhea    Past Medical History, Surgical history, Social history, and Family History were reviewed and updated.  Review of Systems: Review of Systems  Constitutional: Negative.   HENT: Negative.   Eyes: Negative.   Respiratory: Negative.   Cardiovascular: Negative.  Gastrointestinal: Negative.   Genitourinary: Negative.   Musculoskeletal: Negative.   Skin: Negative.   Neurological: Negative.   Endo/Heme/Allergies: Negative.   Psychiatric/Behavioral: Negative.     Physical Exam:  weight is 182 lb 8 oz (82.8 kg). His oral temperature is 97.5 F (36.4 C) (abnormal). His blood pressure is 138/59 (abnormal) and his pulse is 56 (abnormal). His respiration is 18 and oxygen saturation is 100%.   Physical Exam  Constitutional: He is oriented to person, place, and time.  HENT:  Head: Normocephalic and atraumatic.  Mouth/Throat: Oropharynx is clear and moist.  Eyes: EOM are normal. Pupils are equal, round, and reactive  to light.  Neck: Normal range of motion.  Cardiovascular: Normal rate, regular rhythm and normal heart sounds.  Pulmonary/Chest: Effort normal and breath sounds normal.  Abdominal: Soft. Bowel sounds are normal.  Musculoskeletal: Normal range of motion. He exhibits no edema, tenderness or deformity.  Lymphadenopathy:    He has no cervical adenopathy.  Neurological: He is alert and oriented to person, place, and time.  Skin: Skin is warm and dry. No rash noted. No erythema.  Psychiatric: He has a normal mood and affect. His behavior is normal. Judgment and thought content normal.  Vitals reviewed.    Lab Results  Component Value Date   WBC 9.8 02/28/2017   HGB 14.8 11/29/2016   HCT 48.4 02/28/2017   MCV 89.1 02/28/2017   PLT 236 02/28/2017     Chemistry      Component Value Date/Time   NA 144 11/29/2016 1054   NA 139 12/29/2015 0954   K 4.2 11/29/2016 1054   K 4.1 12/29/2015 0954   CL 102 11/29/2016 1054   CO2 30 11/29/2016 1054   CO2 26 12/29/2015 0954   BUN 22 11/29/2016 1054   BUN 20.0 12/29/2015 0954   CREATININE 1.2 11/29/2016 1054   CREATININE 1.3 12/29/2015 0954      Component Value Date/Time   CALCIUM 9.3 11/29/2016 1054   CALCIUM 9.5 12/29/2015 0954   ALKPHOS 92 (H) 11/29/2016 1054   ALKPHOS 98 12/29/2015 0954   AST 22 11/29/2016 1054   AST 16 12/29/2015 0954   ALT 20 11/29/2016 1054   ALT 13 12/29/2015 0954   BILITOT 0.90 11/29/2016 1054   BILITOT 0.71 12/29/2015 0954      Impression and Plan: Alexander [name] is 77 year old gentleman. He has polycythemia. He is JAK2 negative however.  We will go ahead and phlebotomize him today.  Is been at least 6 months since he had a phlebotomy.  We will plan to get him back to see [place] in another 3 months.  I think this would be reasonable for follow-up given .  [name], MD 2/27/201910:20 AM

## 2017-02-28 NOTE — Progress Notes (Signed)
[name] presents today for phlebotomy per MD orders. Phlebotomy procedure started at 1045 and ended at 1050. 500 cc removed. Patient tolerated procedure well. IV needle removed intact.

## 2017-02-28 NOTE — Patient Instructions (Signed)
Therapeutic Phlebotomy Therapeutic phlebotomy is the controlled removal of blood from a person's body for the purpose of treating a medical condition. The procedure is similar to donating blood. Usually, about a pint (470 mL, or 0.47L) of blood is removed. The average adult has 9-12 pints (4.3-5.7 L) of blood. Therapeutic phlebotomy may be used to treat the following medical conditions:  Hemochromatosis. This is a condition in which the blood contains too much iron.  Polycythemia vera. This is a condition in which the blood contains too many red blood cells.  Porphyria cutanea tarda. This is a disease in which an important part of hemoglobin is not made properly. It results in the buildup of abnormal amounts of porphyrins in the body.  Sickle cell disease. This is a condition in which the red blood cells form an abnormal crescent shape rather than a round shape.  Tell a health care provider about:  Any allergies you have.  All medicines you are taking, including vitamins, herbs, eye drops, creams, and over-the-counter medicines.  Any problems you or family members have had with anesthetic medicines.  Any blood disorders you have.  Any surgeries you have had.  Any medical conditions you have. What are the risks? Generally, this is a safe procedure. However, problems may occur, including:  Nausea or light-headedness.  Low blood pressure.  Soreness, bleeding, swelling, or bruising at the needle insertion site.  Infection.  What happens before the procedure?  Follow instructions from your health care provider about eating or drinking restrictions.  Ask your health care provider about changing or stopping your regular medicines. This is especially important if you are taking diabetes medicines or blood thinners.  Wear clothing with sleeves that can be raised above the elbow.  Plan to have someone take you home after the procedure.  You may have a blood sample taken. What  happens during the procedure?  A needle will be inserted into one of your veins.  Tubing and a collection bag will be attached to that needle.  Blood will flow through the needle and tubing into the collection bag.  You may be asked to open and close your hand slowly and continually during the entire collection.  After the specified amount of blood has been removed from your body, the collection bag and tubing will be clamped.  The needle will be removed from your vein.  Pressure will be held on the site of the needle insertion to stop the bleeding.  A bandage (dressing) will be placed over the needle insertion site. The procedure may vary among health care providers and hospitals. What happens after the procedure?  Your recovery will be assessed and monitored.  You can return to your normal activities as directed by your health care provider. This information is not intended to replace advice given to you by your health care provider. Make sure you discuss any questions you have with your health care provider. Document Released: 05/23/2010 Document Revised: 08/21/2015 Document Reviewed: 12/15/2013 Elsevier Interactive Patient Education  2018 [employer]vier Inc.  

## 2017-03-05 ENCOUNTER — Encounter: Payer: Self-pay | Admitting: Family Medicine

## 2017-03-05 ENCOUNTER — Ambulatory Visit (INDEPENDENT_AMBULATORY_CARE_PROVIDER_SITE_OTHER): Payer: Medicare HMO | Admitting: Family Medicine

## 2017-03-05 VITALS — BP 136/84 | HR 58 | Temp 97.9°F | Wt 184.0 lb

## 2017-03-05 DIAGNOSIS — M26629 Arthralgia of temporomandibular joint, unspecified side: Secondary | ICD-10-CM | POA: Diagnosis not present

## 2017-03-05 NOTE — Patient Instructions (Signed)
Tylenol................. to 3 times daily  Soft diet  If in a couple days the discomfort does not resolve then consult with your dentist..

## 2017-03-05 NOTE — Progress Notes (Signed)
Alexander [name] is a 77 year old married male nonsmoker who comes in today accompanied by his wife for evaluation of a left-sided earache for 3 days  He had dental work last week  On Saturday noticed some discomfort in his left ear. No fever chills etc. etc.  Review of systems negative except for dental work as noted above  BP 136/84 (BP Location: Left Arm, Patient Position: Sitting, Cuff Size: Normal)   Pulse (!) 58   Temp 97.9 F (36.6 C) (Oral)   Wt 184 lb (83.5 kg)   BMI 27.17 kg/m  Well-developed well-nourished male no acute distress vital signs stable he is afebrile left and right ear canals eardrums all normal. Tenderness left TMJ  #1 TMJ syndrome......... Tylenol...... 2 tabs 3 times daily and a soft diet....... patient intolerant of NSAIDs...... had a GI bleed. Consult with Simona Huh if pain does not resolve

## 2017-03-13 ENCOUNTER — Encounter: Payer: Self-pay | Admitting: Adult Health

## 2017-03-13 ENCOUNTER — Ambulatory Visit (INDEPENDENT_AMBULATORY_CARE_PROVIDER_SITE_OTHER): Payer: Medicare HMO | Admitting: Adult Health

## 2017-03-13 VITALS — BP 136/72 | Temp 98.4°F | Wt 182.0 lb

## 2017-03-13 DIAGNOSIS — J302 Other seasonal allergic rhinitis: Secondary | ICD-10-CM | POA: Diagnosis not present

## 2017-03-13 NOTE — Progress Notes (Signed)
Subjective:    Patient ID: Alexander [name], male    DOB: May 01, 1940, 77 y.o.   MRN: [id number]  HPI  77 year old male who  has a past medical history of Arthritis, CAD (coronary artery disease), Diverticulosis of colon, GERD (gastroesophageal reflux disease), GI bleed, Gout, Hematuria, microscopic, Hemorrhoids, Hyperglycemia, Hyperlipidemia, Hypertension, Lumbar back pain, Microalbuminuria, Overweight(278.02), Polycythemia rubra vera ([place]), and Tubular adenoma of colon (07/2012).  He presents to the office today for an acute issue.  He reports 7 days of sneezing, nasal congestion, clear rhinorrhea, fatigue and sore throat.  He has been using Allegra and Astelin at home which he reports his symptoms have improved greatly over the last week.  He denies any fevers, chills, cough.   Review of Systems See HPI   Past Medical History:  Diagnosis Date  . Arthritis    "minor; shoulders primarily" (05/20/2013)  . CAD (coronary artery disease)    a. 05/2013 - Chest pain/unstable angina and dynamic EKG changes showing cath showing atherosclerotic coronary artery disease manifested as diffuse ectasia, normal EF.  . Diverticulosis of colon   . GERD (gastroesophageal reflux disease)   . GI bleed    secondary to Aspirin  . Gout   . Hematuria, microscopic   . Hemorrhoids   . Hyperglycemia   . Hyperlipidemia   . Hypertension   . Lumbar back pain   . Microalbuminuria   . Overweight(278.02)   . Polycythemia rubra vera ([place])   . Tubular adenoma of colon 07/2012    Social History   Socioeconomic History  . Marital status: Married    Spouse name: Not on file  . Number of children: 3  . Years of education: Not on file  . Highest education level: Not on file  Social Needs  . Financial resource strain: Not on file  . Food insecurity - worry: Not on file  . Food insecurity - inability: Not on file  . Transportation needs - medical: Not on file  . Transportation needs - non-medical: Not on file   Occupational History  . Occupation: retired Lobbyist: RETIRED  Tobacco Use  . Smoking status: Never Smoker  . Smokeless tobacco: Never Used  . Tobacco comment: NEVER USED TOBACCO  Substance and Sexual Activity  . Alcohol use: No    Alcohol/week: 0.0 oz  . Drug use: No  . Sexual activity: Yes    Partners: Female  Other Topics Concern  . Not on file  Social History Narrative   Marriedfor 60 years    Daughter, [name] (pediatrician--lives in [place], [place]), one in [place] - Engineer, water. One in [place] - Chief Strategy Officer           Past Surgical History:  Procedure Laterality Date  . CARDIAC CATHETERIZATION  05/20/2013  . CATARACT EXTRACTION W/ INTRAOCULAR LENS  IMPLANT, BILATERAL Bilateral 2008  . CYSTECTOMY  ~ 2000   " SEBACEOUS cyst removed from between shoulder blades"  . EXCISIONAL HEMORRHOIDECTOMY  1970's  . INGUINAL HERNIA REPAIR Right ~ 2010  . LEFT HEART CATHETERIZATION WITH CORONARY ANGIOGRAM N/A 05/20/2013   Procedure: LEFT HEART CATHETERIZATION WITH CORONARY ANGIOGRAM;  Surgeon: Peter M Martinique, MD;  Location: [facility] CATH LAB;  Service: Cardiovascular;  Laterality: N/A;  . TONSILLECTOMY AND ADENOIDECTOMY  1940's  . VASECTOMY      Family History  Problem Relation Age of Onset  . Colon cancer Mother        died at 10, colorectal  . Diabetes Mother   .  Heart disease Father 36       MI  . Diabetes Father   . Stomach cancer Maternal Grandfather   . Cancer Maternal Uncle     Allergies  Allergen Reactions  . Metformin And Related Diarrhea    Current Outpatient Medications on File Prior to Visit  Medication Sig Dispense Refill  . ACCU-CHEK SOFTCLIX LANCETS lancets Use to test blood glucose once daily 100 each 3  . allopurinol (ZYLOPRIM) 100 MG tablet TAKE 1 TABLET BY MOUTH ONCE DAILY 90 tablet 2  . amLODipine (NORVASC) 10 MG tablet Take 1 tablet (10 mg total) by mouth daily. 90 tablet 0  . aspirin EC 81 MG tablet Take 1 tablet (81 mg total) by mouth daily. 90 tablet 3   . azelastine (ASTELIN) 0.1 % nasal spray Place 1 spray into both nostrils 2 (two) times daily. Use in each nostril as directed 30 mL 3  . Blood Glucose Monitoring Suppl (ACCU-CHEK AVIVA PLUS) w/Device KIT Use to test blood glucose once daily 1 kit 0  . Coenzyme Q10 300 MG CAPS Take 1 capsule by mouth daily.     . diphenhydrAMINE (BENADRYL) 2 % cream Apply topically 3 (three) times daily as needed for itching.    [name] glucose blood (ACCU-CHEK AVIVA) test strip Use to test blood glucose once daily 100 each 3  . hydrocortisone (ANUCORT-HC) 25 MG suppository Take as directed 120 suppository 0  . hydrocortisone (PROCTOZONE-HC) 2.5 % rectal cream Place rectally 2 (two) times daily. 60 g 3  . lisinopril (PRINIVIL,ZESTRIL) 10 MG tablet Take 1 tablet (10 mg total) by mouth daily. 90 tablet 0  . pantoprazole (PROTONIX) 40 MG tablet TAKE 1 TABLET BY MOUTH ONCE DAILY 90 tablet 1  . pravastatin (PRAVACHOL) 40 MG tablet Take 1 tablet (40 mg total) by mouth daily. 90 tablet 3  . prednisoLONE acetate (PRED FORTE) 1 % ophthalmic suspension     . triamcinolone (NASACORT) 55 MCG/ACT AERO nasal inhaler Place 2 sprays into the nose daily. 1 Inhaler 12   No current facility-administered medications on file prior to visit.     BP 136/72   Temp 98.4 F (36.9 C) (Oral)   Wt 182 lb (82.6 kg)   BMI 26.88 kg/m       Objective:   Physical Exam  Constitutional: He appears well-developed and well-nourished. No distress.  HENT:  Right Ear: Hearing, tympanic membrane, external ear and ear canal normal.  Left Ear: Hearing, tympanic membrane, external ear and ear canal normal.  Nose: Mucosal edema and rhinorrhea present.  Mouth/Throat: Uvula is midline, oropharynx is clear and moist and mucous membranes are normal.  Cardiovascular: Normal rate, regular rhythm, normal heart sounds and intact distal pulses. Exam reveals no gallop and no friction rub.  No murmur heard. Pulmonary/Chest: Effort normal and breath sounds  normal. No respiratory distress. He has no wheezes. He has no rales. He exhibits no tenderness.  Skin: He is not diaphoretic.  Nursing note and vitals reviewed.     Assessment & Plan:  1. Seasonal allergies -Allergies or viral syndrome.  His symptoms have improved.  He can continue his current treatment and follow-up as needed  [name], NP

## 2017-03-13 NOTE — Progress Notes (Signed)
 Subjective:    Patient ID: Alexander [name], male    DOB: 06/17/1940, 77 y.o.   MRN: [id number]  HPI Presents with 7 Day history of sneezing, nasal congestion, clear rhinorrhea, fatigue and sore throat.  He denies fever, chills, cough.  He has been using Allegra and Astelin nasal spray and eye drops.  He has improved greatly over the past week.   Review of Systems  Constitutional: Positive for appetite change and fatigue. Negative for chills and fever.  HENT: Positive for congestion, rhinorrhea, sneezing and sore throat. Negative for ear discharge, ear pain, postnasal drip, sinus pressure and sinus pain.   Respiratory: Negative for cough, chest tightness, shortness of breath and wheezing.   Cardiovascular: Negative for chest pain and palpitations.  Gastrointestinal: Negative for diarrhea.   Past Medical History:  Diagnosis Date  . Arthritis    "minor; shoulders primarily" (05/20/2013)  . CAD (coronary artery disease)    a. 05/2013 - Chest pain/unstable angina and dynamic EKG changes showing cath showing atherosclerotic coronary artery disease manifested as diffuse ectasia, normal EF.  . Diverticulosis of colon   . GERD (gastroesophageal reflux disease)   . GI bleed    secondary to Aspirin  . Gout   . Hematuria, microscopic   . Hemorrhoids   . Hyperglycemia   . Hyperlipidemia   . Hypertension   . Lumbar back pain   . Microalbuminuria   . Overweight(278.02)   . Polycythemia rubra vera ([facility])   . Tubular adenoma of colon 07/2012    Social History   Socioeconomic History  . Marital status: Married    Spouse name: Not on file  . Number of children: 3  . Years of education: Not on file  . Highest education level: Not on file  Social Needs  . Financial resource strain: Not on file  . Food insecurity - worry: Not on file  . Food insecurity - inability: Not on file  . Transportation needs - medical: Not on file  . Transportation needs - non-medical: Not on file    Occupational History  . Occupation: retired engineer    Employer: RETIRED  Tobacco Use  . Smoking status: Never Smoker  . Smokeless tobacco: Never Used  . Tobacco comment: NEVER USED TOBACCO  Substance and Sexual Activity  . Alcohol use: No    Alcohol/week: 0.0 oz  . Drug use: No  . Sexual activity: Yes    Partners: Female  Other Topics Concern  . Not on file  Social History Narrative   Marriedfor 52 years    Daughter, Marcie (pediatrician--lives in [place], [place]), one in [place] - psychologist. One in [place] - Author           Past Surgical History:  Procedure Laterality Date  . CARDIAC CATHETERIZATION  05/20/2013  . CATARACT EXTRACTION W/ INTRAOCULAR LENS  IMPLANT, BILATERAL Bilateral 2008  . CYSTECTOMY  ~ 2000   " SEBACEOUS cyst removed from between shoulder blades"  . EXCISIONAL HEMORRHOIDECTOMY  1970's  . INGUINAL HERNIA REPAIR Right ~ 2010  . LEFT HEART CATHETERIZATION WITH CORONARY ANGIOGRAM N/A 05/20/2013   Procedure: LEFT HEART CATHETERIZATION WITH CORONARY ANGIOGRAM;  Surgeon: Peter M Jordan, MD;  Location: [facility] CATH LAB;  Service: Cardiovascular;  Laterality: N/A;  . TONSILLECTOMY AND ADENOIDECTOMY  1940's  . VASECTOMY      Family History  Problem Relation Age of Onset  . Colon cancer Mother        died at 73, colorectal  . Diabetes   Mother   . Heart disease Father 64       MI  . Diabetes Father   . Stomach cancer Maternal Grandfather   . Cancer Maternal Uncle     Allergies  Allergen Reactions  . Metformin And Related Diarrhea    Current Outpatient Medications on File Prior to Visit  Medication Sig Dispense Refill  . ACCU-CHEK SOFTCLIX LANCETS lancets Use to test blood glucose once daily 100 each 3  . allopurinol (ZYLOPRIM) 100 MG tablet TAKE 1 TABLET BY MOUTH ONCE DAILY 90 tablet 2  . amLODipine (NORVASC) 10 MG tablet Take 1 tablet (10 mg total) by mouth daily. 90 tablet 0  . aspirin EC 81 MG tablet Take 1 tablet (81 mg total) by mouth daily. 90 tablet 3   . azelastine (ASTELIN) 0.1 % nasal spray Place 1 spray into both nostrils 2 (two) times daily. Use in each nostril as directed 30 mL 3  . Blood Glucose Monitoring Suppl (ACCU-CHEK AVIVA PLUS) w/Device KIT Use to test blood glucose once daily 1 kit 0  . Coenzyme Q10 300 MG CAPS Take 1 capsule by mouth daily.     . diphenhydrAMINE (BENADRYL) 2 % cream Apply topically 3 (three) times daily as needed for itching.    . glucose blood (ACCU-CHEK AVIVA) test strip Use to test blood glucose once daily 100 each 3  . hydrocortisone (ANUCORT-HC) 25 MG suppository Take as directed 120 suppository 0  . hydrocortisone (PROCTOZONE-HC) 2.5 % rectal cream Place rectally 2 (two) times daily. 60 g 3  . lisinopril (PRINIVIL,ZESTRIL) 10 MG tablet Take 1 tablet (10 mg total) by mouth daily. 90 tablet 0  . pantoprazole (PROTONIX) 40 MG tablet TAKE 1 TABLET BY MOUTH ONCE DAILY 90 tablet 1  . pravastatin (PRAVACHOL) 40 MG tablet Take 1 tablet (40 mg total) by mouth daily. 90 tablet 3  . prednisoLONE acetate (PRED FORTE) 1 % ophthalmic suspension     . triamcinolone (NASACORT) 55 MCG/ACT AERO nasal inhaler Place 2 sprays into the nose daily. 1 Inhaler 12   No current facility-administered medications on file prior to visit.     BP 136/72   Temp 98.4 F (36.9 C) (Oral)   Wt 182 lb (82.6 kg)   BMI 26.88 kg/m     Objective:   Physical Exam  Constitutional: He is oriented to person, place, and time. He appears well-developed and well-nourished. No distress.  HENT:  Nose: Mucosal edema and rhinorrhea present.  Nasal turbinates are erythematous.   Cardiovascular: Normal rate, regular rhythm and normal heart sounds. Exam reveals no gallop and no friction rub.  No murmur heard. Pulmonary/Chest: Effort normal and breath sounds normal. No respiratory distress. He has no wheezes. He has no rales.  Lymphadenopathy:    He has no cervical adenopathy.  Neurological: He is alert and oriented to person, place, and time.   Skin: He is not diaphoretic.  Nursing note and vitals reviewed.     Assessment & Plan:  1. Seasonal allergies Continue Allegra and Astelin nasal spray and eye drops.  Given return to office information.  Follow up as needed.    C  BSN RN NP student  

## 2017-04-03 ENCOUNTER — Encounter: Payer: Self-pay | Admitting: Adult Health

## 2017-04-03 ENCOUNTER — Ambulatory Visit (INDEPENDENT_AMBULATORY_CARE_PROVIDER_SITE_OTHER): Payer: Medicare HMO | Admitting: Adult Health

## 2017-04-03 VITALS — BP 128/78 | Temp 98.3°F | Wt 181.0 lb

## 2017-04-03 DIAGNOSIS — L03115 Cellulitis of right lower limb: Secondary | ICD-10-CM

## 2017-04-03 MED ORDER — DOXYCYCLINE HYCLATE 100 MG PO CAPS: 100.0000 mg | ORAL_CAPSULE | Freq: Two times a day (BID) | ORAL | 0 refills | Status: DC

## 2017-04-03 NOTE — Progress Notes (Signed)
Subjective:    Patient ID: Alexander [name], male    DOB: [birth year], 77 y.o.   MRN: [id number]  HPI  77 year old male who presents to the office today for 2-3 days of redness, warmth, and soreness to his right lower leg on the shin.  Denies any trauma, has not noticed any streaking.  Denies any calf tenderness or pain.   Review of Systems See HPI   Past Medical History:  Diagnosis Date  . Arthritis    "minor; shoulders primarily" (05/20/2013)  . CAD (coronary artery disease)    a. 05/2013 - Chest pain/unstable angina and dynamic EKG changes showing cath showing atherosclerotic coronary artery disease manifested as diffuse ectasia, normal EF.  . Diverticulosis of colon   . GERD (gastroesophageal reflux disease)   . GI bleed    secondary to Aspirin  . Gout   . Hematuria, microscopic   . Hemorrhoids   . Hyperglycemia   . Hyperlipidemia   . Hypertension   . Lumbar back pain   . Microalbuminuria   . Overweight(278.02)   . Polycythemia rubra vera ([place])   . Tubular adenoma of colon 07/2012    Social History   Socioeconomic History  . Marital status: Married    Spouse name: Not on file  . Number of children: 3  . Years of education: Not on file  . Highest education level: Not on file  Occupational History  . Occupation: retired Lobbyist: RETIRED  Social Needs  . Financial resource strain: Not on file  . Food insecurity:    Worry: Not on file    Inability: Not on file  . Transportation needs:    Medical: Not on file    Non-medical: Not on file  Tobacco Use  . Smoking status: Never Smoker  . Smokeless tobacco: Never Used  . Tobacco comment: NEVER USED TOBACCO  Substance and Sexual Activity  . Alcohol use: No    Alcohol/week: 0.0 oz  . Drug use: No  . Sexual activity: Yes    Partners: Female  Lifestyle  . Physical activity:    Days per week: Not on file    Minutes per session: Not on file  . Stress: Not on file  Relationships  . Social  connections:    Talks on phone: Not on file    Gets together: Not on file    Attends religious service: Not on file    Active member of club or organization: Not on file    Attends meetings of clubs or organizations: Not on file    Relationship status: Not on file  . Intimate partner violence:    Fear of current or ex partner: Not on file    Emotionally abused: Not on file    Physically abused: Not on file    Forced sexual activity: Not on file  Other Topics Concern  . Not on file  Social History Narrative   Marriedfor 24 years    Daughter, [name] (pediatrician--lives in [place], [place]), one in [place] - Engineer, water. One in [place] - Chief Strategy Officer           Past Surgical History:  Procedure Laterality Date  . CARDIAC CATHETERIZATION  05/20/2013  . CATARACT EXTRACTION W/ INTRAOCULAR LENS  IMPLANT, BILATERAL Bilateral 2008  . CYSTECTOMY  ~ 2000   " SEBACEOUS cyst removed from between shoulder blades"  . EXCISIONAL HEMORRHOIDECTOMY  1970's  . INGUINAL HERNIA REPAIR Right ~ 2010  . LEFT HEART CATHETERIZATION WITH  CORONARY ANGIOGRAM N/A 05/20/2013   Procedure: LEFT HEART CATHETERIZATION WITH CORONARY ANGIOGRAM;  Surgeon: Peter M Martinique, MD;  Location[facility] C[facility] LAB;  Service: Cardiovascular;  Laterality: N/A;  . TONSILLECTOMY AND ADENOIDECTOMY  1940's  . VASECTOMY      Family History  Problem Relation Age of Onset  . Colon cancer Mother        died at 59, colorectal  . Diabetes Mother   . Heart disease Father 16       MI  . Diabetes Father   . Stomach cancer Maternal Grandfather   . Cancer Maternal Uncle     Allergies  Allergen Reactions  . Metformin And Related Diarrhea    Current Outpatient Medications on File Prior to Visit  Medication Sig Dispense Refill  . ACCU-CHEK SOFTCLIX LANCETS lancets Use to test blood glucose once daily 100 each 3  . allopurinol (ZYLOPRIM) 100 MG tablet TAKE 1 TABLET BY MOUTH ONCE DAILY 90 tablet 2  . amLODipine (NORVASC) 10 MG tablet Take 1 tablet (10  mg total) by mouth daily. 90 tablet 0  . aspirin EC 81 MG tablet Take 1 tablet (81 mg total) by mouth daily. 90 tablet 3  . azelastine (ASTELIN) 0.1 % nasal spray Place 1 spray into both nostrils 2 (two) times daily. Use in each nostril as directed 30 mL 3  . Blood Glucose Monitoring Suppl (ACCU-CHEK AVIVA PLUS) w/Device KIT Use to test blood glucose once daily 1 kit 0  . Coenzyme Q10 300 MG CAPS Take 1 capsule by mouth daily.     . diphenhydrAMINE (BENADRYL) 2 % cream Apply topically 3 (three) times daily as needed for itching.    [name] glucose blood (ACCU-CHEK AVIVA) test strip Use to test blood glucose once daily 100 each 3  . hydrocortisone (ANUCORT-HC) 25 MG suppository Take as directed 120 suppository 0  . hydrocortisone (PROCTOZONE-HC) 2.5 % rectal cream Place rectally 2 (two) times daily. 60 g 3  . lisinopril (PRINIVIL,ZESTRIL) 10 MG tablet Take 1 tablet (10 mg total) by mouth daily. 90 tablet 0  . pantoprazole (PROTONIX) 40 MG tablet TAKE 1 TABLET BY MOUTH ONCE DAILY 90 tablet 1  . pravastatin (PRAVACHOL) 40 MG tablet Take 1 tablet (40 mg total) by mouth daily. 90 tablet 3  . prednisoLONE acetate (PRED FORTE) 1 % ophthalmic suspension     . triamcinolone (NASACORT) 55 MCG/ACT AERO nasal inhaler Place 2 sprays into the nose daily. 1 Inhaler 12   No current facility-administered medications on file prior to visit.     BP 128/78 (BP Location: Left Arm)   Temp 98.3 F (36.8 C) (Oral)   Wt 181 lb (82.1 kg)   BMI 26.73 kg/m       Objective:   Physical Exam  Constitutional: He is oriented to person, place, and time. He appears well-developed and well-nourished. No distress.  Cardiovascular: Normal rate, regular rhythm, normal heart sounds and intact distal pulses. Exam reveals no gallop and no friction rub.  No murmur heard. Pulmonary/Chest: Effort normal and breath sounds normal. No respiratory distress. He has no wheezes. He has no rales. He exhibits no tenderness.  Neurological: He  is alert and oriented to person, place, and time.  Skin: Skin is warm and dry. He is not diaphoretic. There is erythema.  Redness, warmth, and tenderness to right lower extremity area over shin.  No streaking noted.  No calf tenderness, warmth, redness  Psychiatric: He has a normal mood and affect. His behavior  is normal. Thought content normal.  Nursing note and vitals reviewed.     Assessment & Plan:  1. Cellulitis of right lower extremity - doxycycline (VIBRAMYCIN) 100 MG capsule; Take 1 capsule (100 mg total) by mouth 2 (two) times daily.  Dispense: 14 capsule; Refill: 0 - Follow up if not resolved in the next 3-5 days or sooner if symptoms become worse   [name], NP

## 2017-04-05 DIAGNOSIS — H1013 Acute atopic conjunctivitis, bilateral: Secondary | ICD-10-CM | POA: Diagnosis not present

## 2017-04-09 ENCOUNTER — Other Ambulatory Visit: Payer: Self-pay | Admitting: Adult Health

## 2017-04-11 NOTE — Telephone Encounter (Signed)
Sent to the pharmacy by e-scribe. 

## 2017-05-23 ENCOUNTER — Other Ambulatory Visit: Payer: Self-pay | Admitting: Adult Health

## 2017-05-24 NOTE — Telephone Encounter (Signed)
Sent to the pharmacy by e-scribe. 

## 2017-05-25 ENCOUNTER — Encounter: Payer: Self-pay | Admitting: Gastroenterology

## 2017-05-30 ENCOUNTER — Inpatient Hospital Stay: Payer: Medicare HMO | Attending: Hematology & Oncology | Admitting: Hematology & Oncology

## 2017-05-30 ENCOUNTER — Inpatient Hospital Stay: Payer: Medicare HMO

## 2017-05-30 ENCOUNTER — Other Ambulatory Visit: Payer: Self-pay

## 2017-05-30 VITALS — BP 117/64 | HR 54 | Temp 97.7°F | Resp 16 | Wt 180.0 lb

## 2017-05-30 DIAGNOSIS — D45 Polycythemia vera: Secondary | ICD-10-CM | POA: Insufficient documentation

## 2017-05-30 LAB — CBC WITH DIFFERENTIAL (CANCER CENTER ONLY)
Basophils Absolute: 0 10*3/uL (ref 0.0–0.1)
Basophils Relative: 0 %
Eosinophils Absolute: 0.2 10*3/uL (ref 0.0–0.5)
Eosinophils Relative: 1 %
HEMATOCRIT: 44.3 % (ref 38.7–49.9)
HEMOGLOBIN: 14.8 g/dL (ref 13.0–17.1)
LYMPHS ABS: 1.3 10*3/uL (ref 0.9–3.3)
Lymphocytes Relative: 9 %
MCH: 30.1 pg (ref 28.0–33.4)
MCHC: 33.4 g/dL (ref 32.0–35.9)
MCV: 90 fL (ref 82.0–98.0)
MONO ABS: 1.3 10*3/uL — AB (ref 0.1–0.9)
MONOS PCT: 10 %
NEUTROS ABS: 10.5 10*3/uL — AB (ref 1.5–6.5)
NEUTROS PCT: 80 %
Platelet Count: 211 10*3/uL (ref 145–400)
RBC: 4.92 MIL/uL (ref 4.20–5.70)
RDW: 15 % (ref 11.1–15.7)
WBC Count: 13.2 10*3/uL — ABNORMAL HIGH (ref 4.0–10.0)

## 2017-05-30 LAB — CMP (CANCER CENTER ONLY)
ALBUMIN: 4.2 g/dL (ref 3.5–5.0)
ALK PHOS: 85 U/L (ref 40–150)
ALT: 17 U/L (ref 0–55)
ANION GAP: 10 (ref 3–11)
AST: 20 U/L (ref 5–34)
BILIRUBIN TOTAL: 0.6 mg/dL (ref 0.2–1.2)
BUN: 25 mg/dL (ref 7–26)
CALCIUM: 9.6 mg/dL (ref 8.4–10.4)
CO2: 25 mmol/L (ref 22–29)
Chloride: 104 mmol/L (ref 98–109)
Creatinine: 1.26 mg/dL (ref 0.70–1.30)
GFR, EST NON AFRICAN AMERICAN: 53 mL/min — AB (ref >60)
GFR, Est AFR Am: >60 mL/min (ref >60)
GLUCOSE: 132 mg/dL (ref 70–140)
POTASSIUM: 4.5 mmol/L (ref 3.5–5.1)
Sodium: 139 mmol/L (ref 136–145)
TOTAL PROTEIN: 7.5 g/dL (ref 6.4–8.3)

## 2017-05-30 LAB — FERRITIN: Ferritin: 24 ng/mL (ref 22–316)

## 2017-05-30 LAB — IRON AND TIBC
Iron: 59 ug/dL (ref 42–163)
SATURATION RATIOS: 16 % — AB (ref 42–163)
TIBC: 364 ug/dL (ref 202–409)
UIBC: 305 ug/dL

## 2017-05-30 NOTE — Progress Notes (Signed)
Hematology and Oncology Follow Up Visit  Alexander [name] [id number] 1940-08-29 77 y.o. 05/30/2017   Principle Diagnosis:  Polycythemia vera-JAK2 negative  Current Therapy:    Phlebotomy to maintain hematocrit below 45% - 12/29/2015  Aspirin 81 mg by mouth daily     Interim History:  Mr.  Alexander [name] is in for a followup.  He is doing pretty well right now.  He is wife are now swimming.  They have a pool.  He goes swimming about 2 hours a day.  It is incredibly fascinating that he has a waterproof iPod and waterproof headphones that he uses.  He feels good.  He has had no complaints this we last saw him.  He was phlebotomized back in February of this year.  Has had no cardiac issues.  He has had no leg swelling.  He has had no rashes.  There is been no nausea or vomiting.  His left shoulder hurts quite a bit today because he had a shingles vaccine yesterday.  Overall, his performance status is [name].  Medications:  Current Outpatient Medications:  .  ACCU-CHEK SOFTCLIX LANCETS lancets, Use to test blood glucose once daily, Disp: 100 each, Rfl: 3 .  allopurinol (ZYLOPRIM) 100 MG tablet, TAKE 1 TABLET BY MOUTH ONCE DAILY, Disp: 90 tablet, Rfl: 2 .  amLODipine (NORVASC) 10 MG tablet, TAKE 1 TABLET BY MOUTH ONCE DAILY, Disp: 90 tablet, Rfl: 0 .  aspirin EC 81 MG tablet, Take 1 tablet (81 mg total) by mouth daily., Disp: 90 tablet, Rfl: 3 .  azelastine (ASTELIN) 0.1 % nasal spray, Place 1 spray into both nostrils 2 (two) times daily. Use in each nostril as directed, Disp: 30 mL, Rfl: 3 .  Blood Glucose Monitoring Suppl (ACCU-CHEK AVIVA PLUS) w/Device KIT, Use to test blood glucose once daily, Disp: 1 kit, Rfl: 0 .  Coenzyme Q10 300 MG CAPS, Take 1 capsule by mouth daily. , Disp: , Rfl:  .  diphenhydrAMINE (BENADRYL) 2 % cream, Apply topically 3 (three) times daily as needed for itching., Disp: , Rfl:  .  doxycycline (VIBRAMYCIN) 100 MG capsule, Take 1 capsule (100 mg total) by mouth 2  (two) times daily., Disp: 14 capsule, Rfl: 0 .  glucose blood (ACCU-CHEK AVIVA) test strip, Use to test blood glucose once daily, Disp: 100 each, Rfl: 3 .  hydrocortisone (ANUCORT-HC) 25 MG suppository, Take as directed, Disp: 120 suppository, Rfl: 0 .  hydrocortisone (PROCTOZONE-HC) 2.5 % rectal cream, Place rectally 2 (two) times daily., Disp: 60 g, Rfl: 3 .  lisinopril (PRINIVIL,ZESTRIL) 10 MG tablet, TAKE 1 TABLET BY MOUTH ONCE DAILY, Disp: 90 tablet, Rfl: 0 .  pantoprazole (PROTONIX) 40 MG tablet, TAKE 1 TABLET BY MOUTH ONCE DAILY, Disp: 90 tablet, Rfl: 1 .  pravastatin (PRAVACHOL) 40 MG tablet, TAKE 1 TABLET BY MOUTH ONCE DAILY, Disp: 90 tablet, Rfl: 0 .  prednisoLONE acetate (PRED FORTE) 1 % ophthalmic suspension, , Disp: , Rfl:  .  triamcinolone (NASACORT) 55 MCG/ACT AERO nasal inhaler, Place 2 sprays into the nose daily., Disp: 1 Inhaler, Rfl: 12 .  lisinopril (PRINIVIL,ZESTRIL) 10 MG tablet, TAKE 1 TABLET BY MOUTH ONCE DAILY, Disp: 90 tablet, Rfl: 0 .  pravastatin (PRAVACHOL) 40 MG tablet, TAKE 1 TABLET BY MOUTH ONCE DAILY, Disp: 90 tablet, Rfl: 0  Allergies:  Allergies  Allergen Reactions  . Metformin And Related Diarrhea    Past Medical History, Surgical history, Social history, and Family History were reviewed and updated.  Review of Systems:  Review of Systems  Constitutional: Negative.   HENT: Negative.   Eyes: Negative.   Respiratory: Negative.   Cardiovascular: Negative.   Gastrointestinal: Negative.   Genitourinary: Negative.   Musculoskeletal: Negative.   Skin: Negative.   Neurological: Negative.   Endo/Heme/Allergies: Negative.   Psychiatric/Behavioral: Negative.     Physical Exam:  weight is 180 lb (81.6 kg). His oral temperature is 97.7 F (36.5 C). His blood pressure is 117/64 and his pulse is 54 (abnormal). His respiration is 16 and oxygen saturation is 100%.   Physical Exam  Constitutional: He is oriented to person, place, and time.  HENT:  Head:  Normocephalic and atraumatic.  Mouth/Throat: Oropharynx is clear and moist.  Eyes: Pupils are equal, round, and reactive to light. EOM are normal.  Neck: Normal range of motion.  Cardiovascular: Normal rate, regular rhythm and normal heart sounds.  Pulmonary/Chest: Effort normal and breath sounds normal.  Abdominal: Soft. Bowel sounds are normal.  Musculoskeletal: Normal range of motion. He exhibits no edema, tenderness or deformity.  Lymphadenopathy:    He has no cervical adenopathy.  Neurological: He is alert and oriented to person, place, and time.  Skin: Skin is warm and dry. No rash noted. No erythema.  Psychiatric: He has a normal mood and affect. His behavior is normal. Judgment and thought content normal.  Vitals reviewed.    Lab Results  Component Value Date   WBC 13.2 (H) 05/30/2017   HGB 14.8 05/30/2017   HCT 44.3 05/30/2017   MCV 90.0 05/30/2017   PLT 211 05/30/2017     Chemistry      Component Value Date/Time   NA 146 (H) 02/28/2017 0935   NA 144 11/29/2016 1054   NA 139 12/29/2015 0954   K 4.4 02/28/2017 0935   K 4.2 11/29/2016 1054   K 4.1 12/29/2015 0954   CL 106 02/28/2017 0935   CL 102 11/29/2016 1054   CO2 31 02/28/2017 0935   CO2 30 11/29/2016 1054   CO2 26 12/29/2015 0954   BUN 22 02/28/2017 0935   BUN 22 11/29/2016 1054   BUN 20.0 12/29/2015 0954   CREATININE 1.20 02/28/2017 0935   CREATININE 1.2 11/29/2016 1054   CREATININE 1.3 12/29/2015 0954      Component Value Date/Time   CALCIUM 9.5 02/28/2017 0935   CALCIUM 9.3 11/29/2016 1054   CALCIUM 9.5 12/29/2015 0954   ALKPHOS 92 (H) 02/28/2017 0935   ALKPHOS 92 (H) 11/29/2016 1054   ALKPHOS 98 12/29/2015 0954   AST 22 02/28/2017 0935   AST 16 12/29/2015 0954   ALT 23 02/28/2017 0935   ALT 20 11/29/2016 1054   ALT 13 12/29/2015 0954   BILITOT 0.8 02/28/2017 0935   BILITOT 0.71 12/29/2015 0954      Impression and Plan: [name] is 77 year old gentleman. He has polycythemia. He is  JAK2 negative however.  Everything is going quite well right now.  He does not need to be phlebotomized.  We will go ahead and get him back in 3 more months.  He may need to be phlebotomized at that point.  [name], MD 5/29/201910:55 AM

## 2017-06-04 ENCOUNTER — Ambulatory Visit (INDEPENDENT_AMBULATORY_CARE_PROVIDER_SITE_OTHER): Payer: Medicare HMO | Admitting: Family Medicine

## 2017-06-04 ENCOUNTER — Encounter: Payer: Self-pay | Admitting: Family Medicine

## 2017-06-04 VITALS — BP 120/80 | HR 74 | Temp 97.9°F | Resp 12 | Ht 69.0 in | Wt 174.0 lb

## 2017-06-04 DIAGNOSIS — R300 Dysuria: Secondary | ICD-10-CM | POA: Diagnosis not present

## 2017-06-04 DIAGNOSIS — H9202 Otalgia, left ear: Secondary | ICD-10-CM | POA: Diagnosis not present

## 2017-06-04 DIAGNOSIS — R350 Frequency of micturition: Secondary | ICD-10-CM

## 2017-06-04 DIAGNOSIS — N410 Acute prostatitis: Secondary | ICD-10-CM

## 2017-06-04 LAB — POCT URINALYSIS DIPSTICK
Blood, UA: NEGATIVE
GLUCOSE UA: NEGATIVE
LEUKOCYTES UA: NEGATIVE
Nitrite, UA: NEGATIVE
Protein, UA: POSITIVE — AB
Spec Grav, UA: >=1.03 — AB (ref 1.010–1.025)
Urobilinogen, UA: 0.2 E.U./dL
pH, UA: 6 (ref 5.0–8.0)

## 2017-06-04 LAB — CBC WITH DIFFERENTIAL/PLATELET
Basophils Absolute: 0.1 10*3/uL (ref 0.0–0.1)
Basophils Relative: 0.6 % (ref 0.0–3.0)
EOS PCT: 0.3 % (ref 0.0–5.0)
Eosinophils Absolute: 0.1 10*3/uL (ref 0.0–0.7)
HEMATOCRIT: 46.9 % (ref 39.0–52.0)
HEMOGLOBIN: 15.2 g/dL (ref 13.0–17.0)
LYMPHS PCT: 13.6 % (ref 12.0–46.0)
Lymphs Abs: 2.1 10*3/uL (ref 0.7–4.0)
MCHC: 32.5 g/dL (ref 30.0–36.0)
MCV: 90.8 fl (ref 78.0–100.0)
MONO ABS: 2 10*3/uL — AB (ref 0.1–1.0)
MONOS PCT: 13.2 % — AB (ref 3.0–12.0)
Neutro Abs: 11.2 10*3/uL — ABNORMAL HIGH (ref 1.4–7.7)
Neutrophils Relative %: 72.3 % (ref 43.0–77.0)
Platelets: 248 10*3/uL (ref 150.0–400.0)
RBC: 5.16 Mil/uL (ref 4.22–5.81)
RDW: 15.7 % — ABNORMAL HIGH (ref 11.5–15.5)
WBC: 15.5 10*3/uL — AB (ref 4.0–10.5)

## 2017-06-04 LAB — HEMOGLOBIN A1C: Hgb A1c MFr Bld: 6 % (ref 4.6–6.5)

## 2017-06-04 LAB — PSA: PSA: 5.24 ng/mL — AB (ref 0.10–4.00)

## 2017-06-04 MED ORDER — CIPROFLOXACIN HCL 500 MG PO TABS: 500.0000 mg | ORAL_TABLET | Freq: Two times a day (BID) | ORAL | 0 refills | Status: DC

## 2017-06-04 NOTE — Patient Instructions (Signed)
A few things to remember from today's visit:   Frequent urination - Plan: POC Urinalysis Dipstick, CBC with Differential/Platelet, PSA  Acute prostatitis - Plan: ciprofloxacin (CIPRO) 500 MG tablet   Prostatitis Prostatitis is swelling or inflammation of the prostate gland. The prostate is a walnut-sized gland that is involved in the production of semen. It is located below a man's bladder, in front of the rectum. There are four types of prostatitis:  Chronic nonbacterial prostatitis. This is the most common type of prostatitis. It may be associated with a viral infection or autoimmune disorder.  Acute bacterial prostatitis. This is the least common type of prostatitis. It starts quickly and is usually associated with a bladder infection, high fever, and shaking chills. It can occur at any age.  Chronic bacterial prostatitis. This type usually results from acute bacterial prostatitis that happens repeatedly (is recurrent) or has not been treated properly. It can occur in men of any age but is most common among middle-aged men whose prostate has begun to get larger. The symptoms are not as severe as symptoms caused by acute bacterial prostatitis.  Prostatodynia or chronic pelvic pain syndrome (CPPS). This type is also called pelvic floor disorder. It is associated with increased muscular tone in the pelvis surrounding the prostate.  What are the causes? Bacterial prostatitis is caused by infection from bacteria. Chronic nonbacterial prostatitis may be caused by:  Urinary tract infections (UTIs).  Nerve damage.  A response by the body's disease-fighting system (autoimmune response).  Chemicals in the urine.  The causes of the other types of prostatitis are usually not known. What are the signs or symptoms? Symptoms of this condition vary depending upon the type of prostatitis. If you have acute bacterial prostatitis, you may experience:  Urinary symptoms, such as: ? Painful  urination. ? Burning during urination. ? Frequent and sudden urges to urinate. ? Inability to start urinating. ? A weak or interrupted stream of urine.  Vomiting.  Nausea.  Fever.  Chills.  Inability to empty the bladder completely.  Pain in the: ? Muscles or joints. ? Lower back. ? Lower abdomen.  If you have any of the other types of prostatitis, you may experience:  Urinary symptoms, such as: ? Sudden urges to urinate. ? Frequent urination. ? Difficulty starting urination. ? Weak urine stream. ? Dribbling after urination.  Discharge from the urethra. The urethra is a tube that opens at the end of the penis.  Pain in the: ? Testicles. ? Penis or tip of the penis. ? Rectum. ? Area in front of the rectum and below the scrotum (perineum).  Problems with sexual function.  Painful ejaculation.  Bloody semen.  How is this diagnosed? This condition may be diagnosed based on:  A physical and medical exam.  Your symptoms.  A urine test to check for bacteria.  An exam in which a health care provider uses a finger to feel the prostate (digital rectal exam).  A test of a sample of semen.  Blood tests.  Ultrasound.  Removal of prostate tissue to be examined under a microscope (biopsy).  Tests to check how your body handles urine (urodynamic tests).  A test to look inside your bladder or urethra (cystoscopy).  How is this treated? Treatment for this condition depends on the type of prostatitis. Treatment may involve:  Medicines to relieve pain or inflammation.  Medicines to help relax your muscles.  Physical therapy.  Heat therapy.  Techniques to help you control certain body functions (  biofeedback).  Relaxation exercises.  Antibiotic medicine, if your condition is caused by bacteria.  Warm water baths (sitz baths). Sitz baths help with relaxing your pelvic floor muscles, which helps to relieve pressure on the prostate.  Follow these  instructions at home:  Take over-the-counter and prescription medicines only as told by your health care provider.  If you were prescribed an antibiotic, take it as told by your health care provider. Do not stop taking the antibiotic even if you start to feel better.  If physical therapy, biofeedback, or relaxation exercises were prescribed, do exercises as instructed.  Take sitz baths as directed by your health care provider. For a sitz bath, sit in warm water that is deep enough to cover your hips and buttocks.  Keep all follow-up visits as told by your health care provider. This is important. Contact a health care provider if:  Your symptoms get worse.  You have a fever. Get help right away if:  You have chills.  You feel nauseous.  You vomit.  You feel light-headed or feel like you are going to faint.  You are unable to urinate.  You have blood or blood clots in your urine. This information is not intended to replace advice given to you by your health care provider. Make sure you discuss any questions you have with your health care provider. Document Released: 12/17/1999 Document Revised: 09/09/2015 Document Reviewed: 09/09/2015 Elsevier Interactive Patient Education  2017 [place].  Please be sure medication list is accurate. If a new problem present, please set up appointment sooner than planned today.

## 2017-06-04 NOTE — Progress Notes (Signed)
ACUTE VISIT   HPI:  Chief Complaint  Patient presents with  . Urinary Frequency    sx started 1 week ago  . Burning with Urination  . Problems getting urine flow started    Alexander [name] is a 77 y.o. male, who is here today with his wife complaining of a week of urinary frequency, urgency, and dysuria. Decreased urine stream. He has not noted rectal pain. Nocturia x 3.  Today he feels like symptoms are improving.  He took cranberry juice extract yesterday.  He has been treated for UTIs and prostatitis in the past.  He is also reporting low-grade fever for the past couple days, max temperature 99.8 F. "Slight" nausea, no vomiting or changes in bowel habits. Negative for back pain, abdominal pain, gross hematuria, or decreased urine output.  Hx of prediabetes.  Reporting a elevated WBCs during his last visit with his oncologist, history of polycythemia vera.  Lab Results  Component Value Date   WBC 13.2 (H) 05/30/2017   HGB 14.8 05/30/2017   HCT 44.3 05/30/2017   MCV 90.0 05/30/2017   PLT 211 05/30/2017    He is also complaining of mild left earache yesterday, today it seems to be better. Exacerbating or alleviating factors not identified. Not sick contact, hearing loss, or ear drainage. He has not try OTC medication.   Review of Systems  Constitutional: Positive for fatigue and fever. Negative for appetite change.  HENT: Positive for ear pain. Negative for congestion, ear discharge, mouth sores, rhinorrhea and sore throat.   Cardiovascular: Negative for palpitations and leg swelling.  Gastrointestinal: Negative for abdominal pain, nausea and vomiting.       No changes in bowel habits.  Genitourinary: Positive for dysuria, frequency and urgency. Negative for decreased urine volume, discharge, flank pain, hematuria and scrotal swelling.  Musculoskeletal: Negative for back pain, gait problem and myalgias.  Neurological: Negative for syncope,  weakness and headaches.  Psychiatric/Behavioral: Positive for sleep disturbance (due to nocturia). Negative for confusion.      Current Outpatient Medications on File Prior to Visit  Medication Sig Dispense Refill  . ACCU-CHEK SOFTCLIX LANCETS lancets Use to test blood glucose once daily 100 each 3  . allopurinol (ZYLOPRIM) 100 MG tablet TAKE 1 TABLET BY MOUTH ONCE DAILY 90 tablet 2  . amLODipine (NORVASC) 10 MG tablet TAKE 1 TABLET BY MOUTH ONCE DAILY 90 tablet 0  . aspirin EC 81 MG tablet Take 1 tablet (81 mg total) by mouth daily. 90 tablet 3  . azelastine (ASTELIN) 0.1 % nasal spray Place 1 spray into both nostrils 2 (two) times daily. Use in each nostril as directed 30 mL 3  . Blood Glucose Monitoring Suppl (ACCU-CHEK AVIVA PLUS) w/Device KIT Use to test blood glucose once daily 1 kit 0  . Coenzyme Q10 300 MG CAPS Take 1 capsule by mouth daily.     . diphenhydrAMINE (BENADRYL) 2 % cream Apply topically 3 (three) times daily as needed for itching.    [name] glucose blood (ACCU-CHEK AVIVA) test strip Use to test blood glucose once daily 100 each 3  . hydrocortisone (ANUCORT-HC) 25 MG suppository Take as directed 120 suppository 0  . hydrocortisone (PROCTOZONE-HC) 2.5 % rectal cream Place rectally 2 (two) times daily. 60 g 3  . lisinopril (PRINIVIL,ZESTRIL) 10 MG tablet TAKE 1 TABLET BY MOUTH ONCE DAILY 90 tablet 0  . pantoprazole (PROTONIX) 40 MG tablet TAKE 1 TABLET BY MOUTH ONCE DAILY 90 tablet 1  .  pravastatin (PRAVACHOL) 40 MG tablet TAKE 1 TABLET BY MOUTH ONCE DAILY 90 tablet 0  . triamcinolone (NASACORT) 55 MCG/ACT AERO nasal inhaler Place 2 sprays into the nose daily. 1 Inhaler 12   No current facility-administered medications on file prior to visit.      Past Medical History:  Diagnosis Date  . Arthritis    "minor; shoulders primarily" (05/20/2013)  . CAD (coronary artery disease)    a. 05/2013 - Chest pain/unstable angina and dynamic EKG changes showing cath showing  atherosclerotic coronary artery disease manifested as diffuse ectasia, normal EF.  . Diverticulosis of colon   . GERD (gastroesophageal reflux disease)   . GI bleed    secondary to Aspirin  . Gout   . Hematuria, microscopic   . Hemorrhoids   . Hyperglycemia   . Hyperlipidemia   . Hypertension   . Lumbar back pain   . Microalbuminuria   . Overweight(278.02)   . Polycythemia rubra vera ([place])   . Tubular adenoma of colon 07/2012   Allergies  Allergen Reactions  . Metformin And Related Diarrhea    Social History   Socioeconomic History  . Marital status: Married    Spouse name: Not on file  . Number of children: 3  . Years of education: Not on file  . Highest education level: Not on file  Occupational History  . Occupation: retired Lobbyist: RETIRED  Social Needs  . Financial resource strain: Not on file  . Food insecurity:    Worry: Not on file    Inability: Not on file  . Transportation needs:    Medical: Not on file    Non-medical: Not on file  Tobacco Use  . Smoking status: Never Smoker  . Smokeless tobacco: Never Used  . Tobacco comment: NEVER USED TOBACCO  Substance and Sexual Activity  . Alcohol use: No    Alcohol/week: 0.0 oz  . Drug use: No  . Sexual activity: Yes    Partners: Female  Lifestyle  . Physical activity:    Days per week: Not on file    Minutes per session: Not on file  . Stress: Not on file  Relationships  . Social connections:    Talks on phone: Not on file    Gets together: Not on file    Attends religious service: Not on file    Active member of club or organization: Not on file    Attends meetings of clubs or organizations: Not on file    Relationship status: Not on file  Other Topics Concern  . Not on file  Social History Narrative   Marriedfor 49 years    Daughter, [name] (pediatrician--lives in [place], [place]), one in [place] - psychologist. One in [place] - Chief Strategy Officer           Vitals:   06/04/17 1204  BP: 120/80    Pulse: 74  Resp: 12  Temp: 97.9 F (36.6 C)  SpO2: 96%   Body mass index is 25.7 kg/m.   Physical Exam  Nursing note and vitals reviewed. Constitutional: He is oriented to person, place, and time. He appears well-developed and well-nourished. No distress.  HENT:  Head: Normocephalic and atraumatic.  Left Ear: Tympanic membrane, external ear and ear canal normal.  Eyes: Pupils are equal, round, and reactive to light. Conjunctivae are normal.  Cardiovascular: Normal rate and regular rhythm.  No murmur heard. Respiratory: Effort normal and breath sounds normal. No respiratory distress.  GI: Soft. He  exhibits no mass. There is no hepatomegaly. There is no tenderness. There is no CVA tenderness.  Genitourinary: Rectal exam shows external hemorrhoid. Rectal exam shows no mass and no tenderness. Prostate is tender (mild). Prostate is not enlarged.  Musculoskeletal: He exhibits no edema or tenderness.  Lymphadenopathy:    He has no cervical adenopathy.  Neurological: He is alert and oriented to person, place, and time. He has normal strength. Gait normal.  Skin: Skin is warm. No erythema.  Psychiatric: He has a normal mood and affect.  Well-groomed, good eye contact.    ASSESSMENT AND PLAN:  Alexander [name] was seen today for urinary frequency, burning with urination and problems getting urine flow started.  Diagnoses and all orders for this visit:  Dysuria  We discussed possible etiologies. No history of STDs or urethral discharge, low risk factors for STDs.  -     POC Urinalysis Dipstick -     CBC with Differential/Platelet -     PSA -     Urine culture  Acute prostatitis  Mild. We discussed diagnosis and treatment options. He has taken Ciprofloxacin in the past and has tolerated it well. We discussed some symptoms of antibiotic therapy. We will start with 2 weeks treatment and will extend to 4 weeks depending of urine culture, symptoms, and CBC.  -     ciprofloxacin  (CIPRO) 500 MG tablet; Take 1 tablet (500 mg total) by mouth 2 (two) times daily for 14 days. -     Urine culture  Urinary frequency  ? UTI, BPH, and hyperglycemia among some to consider. History of prediabetes, urine dipstick negative for glucose, otherwise negative except for mild proteinuria.  He reported history of protein in her urine in the past. HgA1C ordered.  Lab Results  Component Value Date   HGBA1C 6.3 08/31/2016    Further recommendation will be given according to urine culture. Follow-up with PCP as needed.  Left ear pain  Resolved. Ear examination today negative. Follow-up with PCP as needed.    Return if symptoms worsen or fail to improve, for dysuria/urine frequency PCP.      Betty G. Martinique, MD  [facility]. [place] office.

## 2017-06-05 LAB — URINE CULTURE
MICRO NUMBER:: 90664634
SPECIMEN QUALITY:: ADEQUATE

## 2017-06-08 ENCOUNTER — Other Ambulatory Visit: Payer: Self-pay | Admitting: Adult Health

## 2017-06-08 ENCOUNTER — Encounter: Payer: Self-pay | Admitting: Adult Health

## 2017-06-12 ENCOUNTER — Encounter: Payer: Self-pay | Admitting: Family Medicine

## 2017-06-12 ENCOUNTER — Encounter: Payer: Self-pay | Admitting: Adult Health

## 2017-06-12 ENCOUNTER — Other Ambulatory Visit: Payer: Self-pay | Admitting: Family Medicine

## 2017-06-12 DIAGNOSIS — N410 Acute prostatitis: Secondary | ICD-10-CM

## 2017-06-12 MED ORDER — CIPROFLOXACIN HCL 500 MG PO TABS: 500.0000 mg | ORAL_TABLET | Freq: Two times a day (BID) | ORAL | 0 refills | Status: AC

## 2017-06-20 ENCOUNTER — Ambulatory Visit: Payer: Medicare HMO | Admitting: Adult Health

## 2017-07-02 ENCOUNTER — Other Ambulatory Visit: Payer: Self-pay | Admitting: Adult Health

## 2017-07-03 DIAGNOSIS — L821 Other seborrheic keratosis: Secondary | ICD-10-CM | POA: Diagnosis not present

## 2017-07-03 DIAGNOSIS — D1801 Hemangioma of skin and subcutaneous tissue: Secondary | ICD-10-CM | POA: Diagnosis not present

## 2017-07-03 DIAGNOSIS — L814 Other melanin hyperpigmentation: Secondary | ICD-10-CM | POA: Diagnosis not present

## 2017-07-03 DIAGNOSIS — D692 Other nonthrombocytopenic purpura: Secondary | ICD-10-CM | POA: Diagnosis not present

## 2017-07-03 DIAGNOSIS — D225 Melanocytic nevi of trunk: Secondary | ICD-10-CM | POA: Diagnosis not present

## 2017-07-03 DIAGNOSIS — L918 Other hypertrophic disorders of the skin: Secondary | ICD-10-CM | POA: Diagnosis not present

## 2017-07-03 NOTE — Telephone Encounter (Signed)
Sent to the pharmacy by e-scribe.  Pt due for yearly 08/2017.

## 2017-07-12 ENCOUNTER — Ambulatory Visit (INDEPENDENT_AMBULATORY_CARE_PROVIDER_SITE_OTHER): Payer: Medicare HMO | Admitting: Adult Health

## 2017-07-12 ENCOUNTER — Encounter: Payer: Self-pay | Admitting: Adult Health

## 2017-07-12 VITALS — BP 126/68 | Temp 98.5°F | Wt 175.0 lb

## 2017-07-12 DIAGNOSIS — Z760 Encounter for issue of repeat prescription: Secondary | ICD-10-CM

## 2017-07-12 DIAGNOSIS — N419 Inflammatory disease of prostate, unspecified: Secondary | ICD-10-CM

## 2017-07-12 LAB — CBC WITH DIFFERENTIAL/PLATELET
Basophils Absolute: 0.1 10*3/uL (ref 0.0–0.1)
Basophils Relative: 0.8 % (ref 0.0–3.0)
EOS ABS: 0.2 10*3/uL (ref 0.0–0.7)
Eosinophils Relative: 2.8 % (ref 0.0–5.0)
HCT: 43.2 % (ref 39.0–52.0)
HEMOGLOBIN: 14.2 g/dL (ref 13.0–17.0)
Lymphocytes Relative: 22.5 % (ref 12.0–46.0)
Lymphs Abs: 1.9 10*3/uL (ref 0.7–4.0)
MCHC: 32.8 g/dL (ref 30.0–36.0)
MCV: 90.7 fl (ref 78.0–100.0)
MONO ABS: 1 10*3/uL (ref 0.1–1.0)
Monocytes Relative: 11.7 % (ref 3.0–12.0)
Neutro Abs: 5.3 10*3/uL (ref 1.4–7.7)
Neutrophils Relative %: 62.2 % (ref 43.0–77.0)
Platelets: 214 10*3/uL (ref 150.0–400.0)
RBC: 4.76 Mil/uL (ref 4.22–5.81)
RDW: 16 % — AB (ref 11.5–15.5)
WBC: 8.5 10*3/uL (ref 4.0–10.5)

## 2017-07-12 LAB — PSA: PSA: 0.55 ng/mL (ref 0.10–4.00)

## 2017-07-12 MED ORDER — HYDROCORTISONE 2.5 % RE CREA: TOPICAL_CREAM | Freq: Two times a day (BID) | RECTAL | 3 refills | Status: DC

## 2017-07-12 MED ORDER — TERBINAFINE HCL 250 MG PO TABS
250.0000 mg | ORAL_TABLET | Freq: Every day | ORAL | 0 refills | Status: DC
Start: 2017-07-12 — End: 2017-10-09

## 2017-07-12 NOTE — Progress Notes (Signed)
Subjective:    Patient ID: Alexander [name], male    DOB: Apr 08, 1940, 77 y.o.   MRN: [id number]  HPI 77 year old male who  has a past medical history of Arthritis, CAD (coronary artery disease), Diverticulosis of colon, GERD (gastroesophageal reflux disease), GI bleed, Gout, Hematuria, microscopic, Hemorrhoids, Hyperglycemia, Hyperlipidemia, Hypertension, Lumbar back pain, Microalbuminuria, Overweight(278.02), Polycythemia rubra vera ([place]), and Tubular adenoma of colon (07/2012).  He presents to the office today for follow up after treatment of prostatitis. He was treated with Cipro for 28 days. Reports that he feels " great". Denies any rectal pain, fevers, chills, n/v/d.   Review of Systems See HPI   Past Medical History:  Diagnosis Date  . Arthritis    "minor; shoulders primarily" (05/20/2013)  . CAD (coronary artery disease)    a. 05/2013 - Chest pain/unstable angina and dynamic EKG changes showing cath showing atherosclerotic coronary artery disease manifested as diffuse ectasia, normal EF.  . Diverticulosis of colon   . GERD (gastroesophageal reflux disease)   . GI bleed    secondary to Aspirin  . Gout   . Hematuria, microscopic   . Hemorrhoids   . Hyperglycemia   . Hyperlipidemia   . Hypertension   . Lumbar back pain   . Microalbuminuria   . Overweight(278.02)   . Polycythemia rubra vera ([place])   . Tubular adenoma of colon 07/2012    Social History   Socioeconomic History  . Marital status: Married    Spouse name: Not on file  . Number of children: 3  . Years of education: Not on file  . Highest education level: Not on file  Occupational History  . Occupation: retired Lobbyist: RETIRED  Social Needs  . Financial resource strain: Not on file  . Food insecurity:    Worry: Not on file    Inability: Not on file  . Transportation needs:    Medical: Not on file    Non-medical: Not on file  Tobacco Use  . Smoking status: Never Smoker  . Smokeless  tobacco: Never Used  . Tobacco comment: NEVER USED TOBACCO  Substance and Sexual Activity  . Alcohol use: No    Alcohol/week: 0.0 oz  . Drug use: No  . Sexual activity: Yes    Partners: Female  Lifestyle  . Physical activity:    Days per week: Not on file    Minutes per session: Not on file  . Stress: Not on file  Relationships  . Social connections:    Talks on phone: Not on file    Gets together: Not on file    Attends religious service: Not on file    Active member of club or organization: Not on file    Attends meetings of clubs or organizations: Not on file    Relationship status: Not on file  . Intimate partner violence:    Fear of current or ex partner: Not on file    Emotionally abused: Not on file    Physically abused: Not on file    Forced sexual activity: Not on file  Other Topics Concern  . Not on file  Social History Narrative   Marriedfor 48 years    Daughter, [name] (pediatrician--lives in [place], [place]), one in [place] - Engineer, water. One in [place] - Chief Strategy Officer           Past Surgical History:  Procedure Laterality Date  . CARDIAC CATHETERIZATION  05/20/2013  . CATARACT EXTRACTION W/ INTRAOCULAR  LENS  IMPLANT, BILATERAL Bilateral 2008  . CYSTECTOMY  ~ 2000   " SEBACEOUS cyst removed from between shoulder blades"  . EXCISIONAL HEMORRHOIDECTOMY  1970's  . INGUINAL HERNIA REPAIR Right ~ 2010  . LEFT HEART CATHETERIZATION WITH CORONARY ANGIOGRAM N/A 05/20/2013   Procedure: LEFT HEART CATHETERIZATION WITH CORONARY ANGIOGRAM;  Surgeon: Peter M Martinique, MD;  Location: [facility] CATH LAB;  Service: Cardiovascular;  Laterality: N/A;  . TONSILLECTOMY AND ADENOIDECTOMY  1940's  . VASECTOMY      Family History  Problem Relation Age of Onset  . Colon cancer Mother        died at 85, colorectal  . Diabetes Mother   . Heart disease Father 44       MI  . Diabetes Father   . Stomach cancer Maternal Grandfather   . Cancer Maternal Uncle     Allergies  Allergen Reactions  .  Metformin And Related Diarrhea    Current Outpatient Medications on File Prior to Visit  Medication Sig Dispense Refill  . ACCU-CHEK SOFTCLIX LANCETS lancets Use to test blood glucose once daily 100 each 3  . allopurinol (ZYLOPRIM) 100 MG tablet TAKE 1 TABLET BY MOUTH ONCE DAILY 90 tablet 2  . amLODipine (NORVASC) 10 MG tablet TAKE 1 TABLET BY MOUTH ONCE DAILY 90 tablet 0  . aspirin EC 81 MG tablet Take 1 tablet (81 mg total) by mouth daily. 90 tablet 3  . azelastine (ASTELIN) 0.1 % nasal spray Place 1 spray into both nostrils 2 (two) times daily. Use in each nostril as directed 30 mL 3  . Blood Glucose Monitoring Suppl (ACCU-CHEK AVIVA PLUS) w/Device KIT Use to test blood glucose once daily 1 kit 0  . Coenzyme Q10 300 MG CAPS Take 1 capsule by mouth daily.     . diphenhydrAMINE (BENADRYL) 2 % cream Apply topically 3 (three) times daily as needed for itching.    [name] glucose blood (ACCU-CHEK AVIVA) test strip Use to test blood glucose once daily 100 each 3  . hydrocortisone (ANUCORT-HC) 25 MG suppository Take as directed 120 suppository 0  . lisinopril (PRINIVIL,ZESTRIL) 10 MG tablet TAKE 1 TABLET BY MOUTH ONCE DAILY 90 tablet 0  . pantoprazole (PROTONIX) 40 MG tablet TAKE 1 TABLET BY MOUTH ONCE DAILY 90 tablet 0  . pravastatin (PRAVACHOL) 40 MG tablet TAKE 1 TABLET BY MOUTH ONCE DAILY 90 tablet 0  . triamcinolone (NASACORT) 55 MCG/ACT AERO nasal inhaler Place 2 sprays into the nose daily. 1 Inhaler 12   No current facility-administered medications on file prior to visit.     BP 126/68   Temp 98.5 F (36.9 C) (Oral)   Wt 175 lb (79.4 kg)   BMI 25.84 kg/m       Objective:   Physical Exam  Constitutional: He is oriented to person, place, and time. He appears well-developed and well-nourished. No distress.  Cardiovascular: Normal rate and regular rhythm.  Pulmonary/Chest: Effort normal and breath sounds normal.  Abdominal: Soft. Bowel sounds are normal.  Neurological: He is oriented  to person, place, and time.  Skin: Skin is warm and dry. Capillary refill takes less than 2 seconds. He is not diaphoretic.  Psychiatric: He has a normal mood and affect. His behavior is normal. Judgment and thought content normal.  Nursing note and vitals reviewed.     Assessment & Plan:  1. Prostatitis, unspecified prostatitis type - Resolved. Will recheck PSA and CBC  - PSA - CBC with Differential/Platelet  2. Medication  refill  - terbinafine (LAMISIL) 250 MG tablet; Take 1 tablet (250 mg total) by mouth daily.  Dispense: 90 tablet; Refill: 0 - hydrocortisone (PROCTOZONE-HC) 2.5 % rectal cream; Place rectally 2 (two) times daily.  Dispense: 60 g; Refill: 3  [name], NP

## 2017-07-16 DIAGNOSIS — I10 Essential (primary) hypertension: Secondary | ICD-10-CM | POA: Diagnosis not present

## 2017-07-16 DIAGNOSIS — Z7952 Long term (current) use of systemic steroids: Secondary | ICD-10-CM | POA: Diagnosis not present

## 2017-07-16 DIAGNOSIS — K219 Gastro-esophageal reflux disease without esophagitis: Secondary | ICD-10-CM | POA: Diagnosis not present

## 2017-07-16 DIAGNOSIS — B351 Tinea unguium: Secondary | ICD-10-CM | POA: Diagnosis not present

## 2017-07-16 DIAGNOSIS — K649 Unspecified hemorrhoids: Secondary | ICD-10-CM | POA: Diagnosis not present

## 2017-07-16 DIAGNOSIS — Z6825 Body mass index (BMI) 25.0-25.9, adult: Secondary | ICD-10-CM | POA: Diagnosis not present

## 2017-07-16 DIAGNOSIS — E785 Hyperlipidemia, unspecified: Secondary | ICD-10-CM | POA: Diagnosis not present

## 2017-07-16 DIAGNOSIS — M109 Gout, unspecified: Secondary | ICD-10-CM | POA: Diagnosis not present

## 2017-07-16 DIAGNOSIS — J309 Allergic rhinitis, unspecified: Secondary | ICD-10-CM | POA: Diagnosis not present

## 2017-07-16 DIAGNOSIS — D45 Polycythemia vera: Secondary | ICD-10-CM | POA: Diagnosis not present

## 2017-07-27 ENCOUNTER — Telehealth: Payer: Self-pay | Admitting: *Deleted

## 2017-07-27 NOTE — Telephone Encounter (Signed)
Patient walked in c/o possible insect bite and swelling of L hand. He has used topical and oral Benadryl but has continued itching. He reports he is supposed to avoid oral antihistamines due to his h/o HTN.  Patient is A&O in no distress. Hand shows area of mild localized edema and surrounding erythema of L 3rd-5th knuckles, ? Insect bite/other microscopic puncture on proximal 4th digit. Patient has full ROM of all fingers, no visibly broken skin, no S/S infection or systemic reaction.   Recommended continued home care w/ topical anti-itch cream such as hydrocortisone cream and cool compress/ice PRN for swelling. Seek care over the weekend for any worsening symptoms, severe swelling, etc. Patient provided with Saturday Clinic information if needed. Patient verbalized understanding of instructions and agreed to plan.

## 2017-08-15 ENCOUNTER — Encounter: Payer: Self-pay | Admitting: Adult Health

## 2017-08-19 ENCOUNTER — Encounter: Payer: Self-pay | Admitting: Adult Health

## 2017-08-28 ENCOUNTER — Other Ambulatory Visit: Payer: Self-pay | Admitting: Adult Health

## 2017-08-28 NOTE — Telephone Encounter (Signed)
Sent to the pharmacy by e-scribe. Pt has cpx scheduled for 09/06/17.

## 2017-08-30 ENCOUNTER — Encounter: Payer: Self-pay | Admitting: Hematology & Oncology

## 2017-08-30 ENCOUNTER — Other Ambulatory Visit: Payer: Self-pay

## 2017-08-30 ENCOUNTER — Inpatient Hospital Stay: Payer: Medicare HMO | Attending: Hematology & Oncology | Admitting: Hematology & Oncology

## 2017-08-30 ENCOUNTER — Inpatient Hospital Stay: Payer: Medicare HMO

## 2017-08-30 VITALS — BP 122/62 | HR 51 | Temp 98.6°F | Resp 18 | Wt 176.0 lb

## 2017-08-30 DIAGNOSIS — D45 Polycythemia vera: Secondary | ICD-10-CM

## 2017-08-30 DIAGNOSIS — I10 Essential (primary) hypertension: Secondary | ICD-10-CM

## 2017-08-30 DIAGNOSIS — Z7982 Long term (current) use of aspirin: Secondary | ICD-10-CM | POA: Diagnosis not present

## 2017-08-30 LAB — CBC WITH DIFFERENTIAL (CANCER CENTER ONLY)
BASOS ABS: 0 10*3/uL (ref 0.0–0.1)
BASOS PCT: 0 %
EOS ABS: 0.3 10*3/uL (ref 0.0–0.5)
Eosinophils Relative: 3 %
HEMATOCRIT: 46.2 % (ref 38.7–49.9)
HEMOGLOBIN: 15.1 g/dL (ref 13.0–17.1)
Lymphocytes Relative: 20 %
Lymphs Abs: 1.7 10*3/uL (ref 0.9–3.3)
MCH: 30 pg (ref 28.0–33.4)
MCHC: 32.7 g/dL (ref 32.0–35.9)
MCV: 91.8 fL (ref 82.0–98.0)
MONOS PCT: 11 %
Monocytes Absolute: 0.9 10*3/uL (ref 0.1–0.9)
NEUTROS ABS: 5.3 10*3/uL (ref 1.5–6.5)
NEUTROS PCT: 66 %
Platelet Count: 238 10*3/uL (ref 145–400)
RBC: 5.03 MIL/uL (ref 4.20–5.70)
RDW: 15.7 % (ref 11.1–15.7)
WBC: 8.1 10*3/uL (ref 4.0–10.0)

## 2017-08-30 LAB — CMP (CANCER CENTER ONLY)
ALBUMIN: 3.9 g/dL (ref 3.5–5.0)
ALT: 22 U/L (ref 10–47)
AST: 24 U/L (ref 11–38)
Alkaline Phosphatase: 78 U/L (ref 26–84)
Anion gap: 7 (ref 5–15)
BILIRUBIN TOTAL: 0.9 mg/dL (ref 0.2–1.6)
BUN: 25 mg/dL — AB (ref 7–22)
CALCIUM: 10 mg/dL (ref 8.0–10.3)
CO2: 32 mmol/L (ref 18–33)
Chloride: 101 mmol/L (ref 98–108)
Creatinine: 1.5 mg/dL — ABNORMAL HIGH (ref 0.60–1.20)
Glucose, Bld: 125 mg/dL — ABNORMAL HIGH (ref 73–118)
Potassium: 4.6 mmol/L (ref 3.3–4.7)
SODIUM: 140 mmol/L (ref 128–145)
Total Protein: 7.2 g/dL (ref 6.4–8.1)

## 2017-08-30 LAB — IRON AND TIBC
Iron: 151 ug/dL (ref 42–163)
Saturation Ratios: 46 % (ref 42–163)
TIBC: 330 ug/dL (ref 202–409)
UIBC: 179 ug/dL

## 2017-08-30 LAB — FERRITIN: FERRITIN: 45 ng/mL (ref 24–336)

## 2017-08-30 NOTE — Progress Notes (Signed)
Hematology and Oncology Follow Up Visit  Alexander [name] [id number] September 19, 1940 77 y.o. 08/30/2017   Principle Diagnosis:  Polycythemia vera-JAK2 negative  Current Therapy:    Phlebotomy to maintain hematocrit below 45% - 12/29/2015  Aspirin 81 mg by mouth daily     Interim History:  [name] is in for a followup.  He is doing pretty well right now.  He is wife are now swimming.  They have a pool.  He goes swimming about 2 hours a day.  It is incredibly fascinating that he has a waterproof iPod and waterproof headphones that he uses.  He feels good.  He has had no complaints this we last saw him.  He was phlebotomized back in February of this year.  Has had no cardiac issues.  He has had no leg swelling.  He has had no rashes.  There is been no nausea or vomiting.  His left shoulder hurts quite a bit today because he had a shingles vaccine yesterday.  Overall, his performance status is [name].  Medications:  Current Outpatient Medications:  .  ACCU-CHEK SOFTCLIX LANCETS lancets, Use to test blood glucose once daily, Disp: 100 each, Rfl: 3 .  allopurinol (ZYLOPRIM) 100 MG tablet, TAKE 1 TABLET BY MOUTH ONCE DAILY, Disp: 90 tablet, Rfl: 0 .  amLODipine (NORVASC) 10 MG tablet, TAKE 1 TABLET BY MOUTH ONCE DAILY, Disp: 90 tablet, Rfl: 0 .  aspirin EC 81 MG tablet, Take 1 tablet (81 mg total) by mouth daily., Disp: 90 tablet, Rfl: 3 .  azelastine (ASTELIN) 0.1 % nasal spray, Place 1 spray into both nostrils 2 (two) times daily. Use in each nostril as directed, Disp: 30 mL, Rfl: 3 .  Blood Glucose Monitoring Suppl (ACCU-CHEK AVIVA PLUS) w/Device KIT, Use to test blood glucose once daily, Disp: 1 kit, Rfl: 0 .  Coenzyme Q10 300 MG CAPS, Take 1 capsule by mouth daily. , Disp: , Rfl:  .  diphenhydrAMINE (BENADRYL) 2 % cream, Apply topically 3 (three) times daily as needed for itching., Disp: , Rfl:  .  glucose blood (ACCU-CHEK AVIVA) test strip, Use to test blood glucose once daily,  Disp: 100 each, Rfl: 3 .  hydrocortisone (ANUCORT-HC) 25 MG suppository, Take as directed, Disp: 120 suppository, Rfl: 0 .  hydrocortisone (PROCTOZONE-HC) 2.5 % rectal cream, Place rectally 2 (two) times daily., Disp: 60 g, Rfl: 3 .  lisinopril (PRINIVIL,ZESTRIL) 10 MG tablet, TAKE 1 TABLET BY MOUTH ONCE DAILY, Disp: 90 tablet, Rfl: 0 .  pantoprazole (PROTONIX) 40 MG tablet, TAKE 1 TABLET BY MOUTH ONCE DAILY, Disp: 90 tablet, Rfl: 0 .  pravastatin (PRAVACHOL) 40 MG tablet, TAKE 1 TABLET BY MOUTH ONCE DAILY, Disp: 90 tablet, Rfl: 0 .  terbinafine (LAMISIL) 250 MG tablet, Take 1 tablet (250 mg total) by mouth daily., Disp: 90 tablet, Rfl: 0 .  triamcinolone (NASACORT) 55 MCG/ACT AERO nasal inhaler, Place 2 sprays into the nose daily., Disp: 1 Inhaler, Rfl: 12  Allergies:  Allergies  Allergen Reactions  . Metformin And Related Diarrhea    Past Medical History, Surgical history, Social history, and Family History were reviewed and updated.  Review of Systems: Review of Systems  Constitutional: Negative.   HENT: Negative.   Eyes: Negative.   Respiratory: Negative.   Cardiovascular: Negative.   Gastrointestinal: Negative.   Genitourinary: Negative.   Musculoskeletal: Negative.   Skin: Negative.   Neurological: Negative.   Endo/Heme/Allergies: Negative.   Psychiatric/Behavioral: Negative.     Physical Exam:  weight is 176 lb (79.8 kg). His oral temperature is 98.6 F (37 C). His blood pressure is 122/62 and his pulse is 51 (abnormal). His respiration is 18 and oxygen saturation is 100%.   Physical Exam  Constitutional: He is oriented to person, place, and time.  HENT:  Head: Normocephalic and atraumatic.  Mouth/Throat: Oropharynx is clear and moist.  Eyes: Pupils are equal, round, and reactive to light. EOM are normal.  Neck: Normal range of motion.  Cardiovascular: Normal rate, regular rhythm and normal heart sounds.  Pulmonary/Chest: Effort normal and breath sounds normal.   Abdominal: Soft. Bowel sounds are normal.  Musculoskeletal: Normal range of motion. He exhibits no edema, tenderness or deformity.  Lymphadenopathy:    He has no cervical adenopathy.  Neurological: He is alert and oriented to person, place, and time.  Skin: Skin is warm and dry. No rash noted. No erythema.  Psychiatric: He has a normal mood and affect. His behavior is normal. Judgment and thought content normal.  Vitals reviewed.    Lab Results  Component Value Date   WBC 8.1 08/30/2017   HGB 15.1 08/30/2017   HCT 46.2 08/30/2017   MCV 91.8 08/30/2017   PLT 238 08/30/2017     Chemistry      Component Value Date/Time   NA 139 05/30/2017 0933   NA 144 11/29/2016 1054   NA 139 12/29/2015 0954   K 4.5 05/30/2017 0933   K 4.2 11/29/2016 1054   K 4.1 12/29/2015 0954   CL 104 05/30/2017 0933   CL 102 11/29/2016 1054   CO2 25 05/30/2017 0933   CO2 30 11/29/2016 1054   CO2 26 12/29/2015 0954   BUN 25 05/30/2017 0933   BUN 22 11/29/2016 1054   BUN 20.0 12/29/2015 0954   CREATININE 1.26 05/30/2017 0933   CREATININE 1.2 11/29/2016 1054   CREATININE 1.3 12/29/2015 0954      Component Value Date/Time   CALCIUM 9.6 05/30/2017 0933   CALCIUM 9.3 11/29/2016 1054   CALCIUM 9.5 12/29/2015 0954   ALKPHOS 85 05/30/2017 0933   ALKPHOS 92 (H) 11/29/2016 1054   ALKPHOS 98 12/29/2015 0954   AST 20 05/30/2017 0933   AST 16 12/29/2015 0954   ALT 17 05/30/2017 0933   ALT 20 11/29/2016 1054   ALT 13 12/29/2015 0954   BILITOT 0.6 05/30/2017 0933   BILITOT 0.71 12/29/2015 0954      Impression and Plan: [name] is 76 year old gentleman. He has polycythemia. He is JAK2 negative however.  We will hold off on phlebotomize him today.  I think he is doing okay.  Right now, we will have him come back in 2 months.  I am sure that by then, we probably will need to do a phlebotomy on him.  I feel bad that his poor wife is having migraines.  [name], MD 8/29/201911:19 AM

## 2017-09-06 ENCOUNTER — Encounter: Payer: Self-pay | Admitting: Adult Health

## 2017-09-06 ENCOUNTER — Other Ambulatory Visit: Payer: Self-pay | Admitting: Adult Health

## 2017-09-06 ENCOUNTER — Ambulatory Visit (INDEPENDENT_AMBULATORY_CARE_PROVIDER_SITE_OTHER): Payer: Medicare HMO | Admitting: Adult Health

## 2017-09-06 VITALS — BP 106/68 | Temp 98.3°F | Ht 69.75 in | Wt 175.0 lb

## 2017-09-06 DIAGNOSIS — R7303 Prediabetes: Secondary | ICD-10-CM

## 2017-09-06 DIAGNOSIS — Z0000 Encounter for general adult medical examination without abnormal findings: Secondary | ICD-10-CM

## 2017-09-06 DIAGNOSIS — E785 Hyperlipidemia, unspecified: Secondary | ICD-10-CM | POA: Diagnosis not present

## 2017-09-06 DIAGNOSIS — K219 Gastro-esophageal reflux disease without esophagitis: Secondary | ICD-10-CM | POA: Diagnosis not present

## 2017-09-06 DIAGNOSIS — Z23 Encounter for immunization: Secondary | ICD-10-CM | POA: Diagnosis not present

## 2017-09-06 DIAGNOSIS — I10 Essential (primary) hypertension: Secondary | ICD-10-CM | POA: Diagnosis not present

## 2017-09-06 DIAGNOSIS — M1000 Idiopathic gout, unspecified site: Secondary | ICD-10-CM

## 2017-09-06 DIAGNOSIS — Z125 Encounter for screening for malignant neoplasm of prostate: Secondary | ICD-10-CM

## 2017-09-06 LAB — LIPID PANEL
Cholesterol: 155 mg/dL (ref 0–200)
HDL: 54.6 mg/dL (ref >39.00)
LDL Cholesterol: 87 mg/dL (ref 0–99)
NONHDL: 100.48
Total CHOL/HDL Ratio: 3
Triglycerides: 67 mg/dL (ref 0.0–149.0)
VLDL: 13.4 mg/dL (ref 0.0–40.0)

## 2017-09-06 LAB — HEPATIC FUNCTION PANEL
ALT: 14 U/L (ref 0–53)
AST: 13 U/L (ref 0–37)
Albumin: 4.4 g/dL (ref 3.5–5.2)
Alkaline Phosphatase: 72 U/L (ref 39–117)
Bilirubin, Direct: 0.1 mg/dL (ref 0.0–0.3)
TOTAL PROTEIN: 7 g/dL (ref 6.0–8.3)
Total Bilirubin: 0.7 mg/dL (ref 0.2–1.2)

## 2017-09-06 LAB — BASIC METABOLIC PANEL
BUN: 25 mg/dL — ABNORMAL HIGH (ref 6–23)
CALCIUM: 9.8 mg/dL (ref 8.4–10.5)
CO2: 31 mEq/L (ref 19–32)
Chloride: 104 mEq/L (ref 96–112)
Creatinine, Ser: 1.17 mg/dL (ref 0.40–1.50)
GFR: 64.16 mL/min (ref >60.00)
GLUCOSE: 109 mg/dL — AB (ref 70–99)
Potassium: 5 mEq/L (ref 3.5–5.1)
Sodium: 140 mEq/L (ref 135–145)

## 2017-09-06 LAB — HEMOGLOBIN A1C: Hgb A1c MFr Bld: 6.2 % (ref 4.6–6.5)

## 2017-09-06 LAB — TSH: TSH: 0.64 u[IU]/mL (ref 0.35–4.50)

## 2017-09-06 NOTE — Addendum Note (Signed)
Addended by: Apolinar Junes on: 09/06/2017 11:01 AM   Modules accepted: Level of Service

## 2017-09-06 NOTE — Progress Notes (Signed)
Subjective:    Patient ID: Alexander [name], male    DOB: 1940-04-28, 77 y.o.   MRN: [id number]  HPI Patient presents for yearly preventative medicine examination. She is a pleasant 77 year old male who  has a past medical history of Arthritis, CAD (coronary artery disease), Diverticulosis of colon, GERD (gastroesophageal reflux disease), GI bleed, Gout, Hematuria, microscopic, Hemorrhoids, Hyperglycemia, Hyperlipidemia, Hypertension, Lumbar back pain, Microalbuminuria, Overweight(278.02), Polycythemia rubra vera ([place]), and Tubular adenoma of colon (07/2012).  Essential Hypertension - Takes Lisinopril 10 mg and Norvasc 10 mg BP Readings from Last 3 Encounters:  09/06/17 106/68  08/30/17 122/62  07/12/17 126/68   Hyperlipidemia - Controlled with diet and Pravastatin 40 mg  Lab Results  Component Value Date   CHOL 174 08/31/2016   HDL 45.60 08/31/2016   LDLCALC 111 (H) 08/31/2016   TRIG 87.0 08/31/2016   CHOLHDL 4 08/31/2016   GERD - Takes Protonix 40 mg- controlled   Gout - Takes Allopurinol 100 mg for gout. No recent flares.   Polycythemia vera-JAK2 negative-is followed by Dr. Marin Olp.  Was last seen August 30, 2017.  Last phlebotomized in February 2019  All immunizations and health maintenance protocols were reviewed with the patient and needed orders were placed. Needs seasonal flu   Appropriate screening laboratory values were ordered for the patient including screening of hyperlipidemia, renal function and hepatic function.  Medication reconciliation,  past medical history, social history, problem list and allergies were reviewed in detail with the patient  Goals were established with regard to weight loss, exercise, and  diet in compliance with medications.  He has been eating heart healthy diet and swimming approximately 2 hours a day Wt Readings from Last 3 Encounters:  09/06/17 175 lb (79.4 kg)  08/30/17 176 lb (79.8 kg)  07/12/17 175 lb (79.4 kg)   End of life  planning was discussed.  Had a flexible sigmoidoscopy in 2015, he is in no longer need of colon cancer screening unless he has issues.  He is up-to-date on routine dental and vision screens  He has no acute complaints.   Review of Systems  Constitutional: Negative.   HENT: Negative.   Eyes: Negative.   Respiratory: Negative.   Cardiovascular: Negative.   Gastrointestinal: Negative.   Endocrine: Negative.   Genitourinary: Negative.   Musculoskeletal: Negative.   Skin: Negative.   Allergic/Immunologic: Negative.   Neurological: Negative.   Hematological: Negative.   Psychiatric/Behavioral: Negative.   All other systems reviewed and are negative.  Past Medical History:  Diagnosis Date  . Arthritis    "minor; shoulders primarily" (05/20/2013)  . CAD (coronary artery disease)    a. 05/2013 - Chest pain/unstable angina and dynamic EKG changes showing cath showing atherosclerotic coronary artery disease manifested as diffuse ectasia, normal EF.  . Diverticulosis of colon   . GERD (gastroesophageal reflux disease)   . GI bleed    secondary to Aspirin  . Gout   . Hematuria, microscopic   . Hemorrhoids   . Hyperglycemia   . Hyperlipidemia   . Hypertension   . Lumbar back pain   . Microalbuminuria   . Overweight(278.02)   . Polycythemia rubra vera ([place])   . Tubular adenoma of colon 07/2012    Social History   Socioeconomic History  . Marital status: Married    Spouse name: Not on file  . Number of children: 3  . Years of education: Not on file  . Highest education level: Not on file  Occupational  History  . Occupation: retired Lobbyist: RETIRED  Social Needs  . Financial resource strain: Not on file  . Food insecurity:    Worry: Not on file    Inability: Not on file  . Transportation needs:    Medical: Not on file    Non-medical: Not on file  Tobacco Use  . Smoking status: Never Smoker  . Smokeless tobacco: Never Used  . Tobacco comment: NEVER USED  TOBACCO  Substance and Sexual Activity  . Alcohol use: No    Alcohol/week: 0.0 standard drinks  . Drug use: No  . Sexual activity: Yes    Partners: Female  Lifestyle  . Physical activity:    Days per week: Not on file    Minutes per session: Not on file  . Stress: Not on file  Relationships  . Social connections:    Talks on phone: Not on file    Gets together: Not on file    Attends religious service: Not on file    Active member of club or organization: Not on file    Attends meetings of clubs or organizations: Not on file    Relationship status: Not on file  . Intimate partner violence:    Fear of current or ex partner: Not on file    Emotionally abused: Not on file    Physically abused: Not on file    Forced sexual activity: Not on file  Other Topics Concern  . Not on file  Social History Narrative   Marriedfor 15 years    Daughter, [name] (pediatrician--lives in [place], [place]), one in [place] - Engineer, water. One in [place] - Chief Strategy Officer           Past Surgical History:  Procedure Laterality Date  . CARDIAC CATHETERIZATION  05/20/2013  . CATARACT EXTRACTION W/ INTRAOCULAR LENS  IMPLANT, BILATERAL Bilateral 2008  . CYSTECTOMY  ~ 2000   " SEBACEOUS cyst removed from between shoulder blades"  . EXCISIONAL HEMORRHOIDECTOMY  1970's  . INGUINAL HERNIA REPAIR Right ~ 2010  . LEFT HEART CATHETERIZATION WITH CORONARY ANGIOGRAM N/A 05/20/2013   Procedure: LEFT HEART CATHETERIZATION WITH CORONARY ANGIOGRAM;  Surgeon: Peter M Martinique, MD;  Location: [facility] CATH LAB;  Service: Cardiovascular;  Laterality: N/A;  . TONSILLECTOMY AND ADENOIDECTOMY  1940's  . VASECTOMY      Family History  Problem Relation Age of Onset  . Colon cancer Mother        died at 29, colorectal  . Diabetes Mother   . Heart disease Father 58       MI  . Diabetes Father   . Stomach cancer Maternal Grandfather   . Cancer Maternal Uncle     Allergies  Allergen Reactions  . Metformin And Related Diarrhea     Current Outpatient Medications on File Prior to Visit  Medication Sig Dispense Refill  . ACCU-CHEK SOFTCLIX LANCETS lancets Use to test blood glucose once daily 100 each 3  . allopurinol (ZYLOPRIM) 100 MG tablet TAKE 1 TABLET BY MOUTH ONCE DAILY 90 tablet 0  . amLODipine (NORVASC) 10 MG tablet TAKE 1 TABLET BY MOUTH ONCE DAILY 90 tablet 0  . aspirin EC 81 MG tablet Take 1 tablet (81 mg total) by mouth daily. 90 tablet 3  . azelastine (ASTELIN) 0.1 % nasal spray Place 1 spray into both nostrils 2 (two) times daily. Use in each nostril as directed 30 mL 3  . Blood Glucose Monitoring Suppl (ACCU-CHEK AVIVA PLUS) w/Device KIT Use  to test blood glucose once daily 1 kit 0  . Coenzyme Q10 300 MG CAPS Take 1 capsule by mouth daily.     . diphenhydrAMINE (BENADRYL) 2 % cream Apply topically 3 (three) times daily as needed for itching.    [name] glucose blood (ACCU-CHEK AVIVA) test strip Use to test blood glucose once daily 100 each 3  . hydrocortisone (ANUCORT-HC) 25 MG suppository Take as directed 120 suppository 0  . hydrocortisone (PROCTOZONE-HC) 2.5 % rectal cream Place rectally 2 (two) times daily. 60 g 3  . lisinopril (PRINIVIL,ZESTRIL) 10 MG tablet TAKE 1 TABLET BY MOUTH ONCE DAILY 90 tablet 0  . pantoprazole (PROTONIX) 40 MG tablet TAKE 1 TABLET BY MOUTH ONCE DAILY 90 tablet 0  . pravastatin (PRAVACHOL) 40 MG tablet TAKE 1 TABLET BY MOUTH ONCE DAILY 90 tablet 0  . terbinafine (LAMISIL) 250 MG tablet Take 1 tablet (250 mg total) by mouth daily. 90 tablet 0  . triamcinolone (NASACORT) 55 MCG/ACT AERO nasal inhaler Place 2 sprays into the nose daily. 1 Inhaler 12   No current facility-administered medications on file prior to visit.     BP 106/68   Temp 98.3 F (36.8 C)   Ht 5' 9.75" (1.772 m) Comment: WITHOUT SHOES  Wt 175 lb (79.4 kg)   BMI 25.29 kg/m       Objective:   Physical Exam  Constitutional: He is oriented to person, place, and time. He appears well-developed and  well-nourished. No distress.  HENT:  Head: Normocephalic and atraumatic.  Right Ear: External ear normal.  Left Ear: External ear normal.  Nose: Nose normal.  Mouth/Throat: Oropharynx is clear and moist. No oropharyngeal exudate.  Eyes: Pupils are equal, round, and reactive to light. Conjunctivae and EOM are normal. Right eye exhibits no discharge. Left eye exhibits no discharge. No scleral icterus.  Neck: Normal range of motion. Neck supple. No JVD present. No tracheal deviation present. No thyromegaly present.  Cardiovascular: Normal rate, regular rhythm, normal heart sounds and intact distal pulses. Exam reveals no gallop and no friction rub.  No murmur heard. Pulmonary/Chest: Effort normal and breath sounds normal. No respiratory distress. He has no wheezes. He has no rales. He exhibits no tenderness.  Abdominal: Soft. Bowel sounds are normal. He exhibits no distension and no mass. There is no tenderness. There is no rebound and no guarding. No hernia.  Musculoskeletal: Normal range of motion. He exhibits no edema, tenderness or deformity.  Lymphadenopathy:    He has no cervical adenopathy.  Neurological: He is alert and oriented to person, place, and time. He displays normal reflexes. No cranial nerve deficit or sensory deficit. He exhibits normal muscle tone. Coordination normal.  Skin: Skin is warm and dry. Capillary refill takes less than 2 seconds. No rash noted. He is not diaphoretic. No erythema. No pallor.  Psychiatric: He has a normal mood and affect. His behavior is normal. Judgment and thought content normal.  Nursing note and vitals reviewed.     Assessment & Plan:  1. Routine general medical examination at a health care facility - Healthy male.  - Continue heart healthy diet and swimming.  - High dose flu given today  - Basic metabolic panel - Hepatic function panel - Lipid panel - TSH - Hemoglobin A1c  2. Hyperlipidemia, unspecified hyperlipidemia type - Consider  titration of statin  - Basic metabolic panel - Hepatic function panel - Lipid panel - TSH - Hemoglobin A1c  3. Idiopathic gout, unspecified chronicity, unspecified site -  Continue with allopurinol   4. Essential hypertension - BP slightly low today. Will have him monitor at home and send me results via mychart. Consider decreasing lisinopril  - Basic metabolic panel - Hepatic function panel - Lipid panel - TSH  5. Gastroesophageal reflux disease without esophagitis - Continue with prilosec   6. Prostate cancer screening - Recent PSA was WNL   7. Prediabetes  - Basic metabolic panel - Hepatic function panel - Lipid panel - TSH - Hemoglobin A1c  [name], NP

## 2017-09-06 NOTE — Addendum Note (Signed)
Addended by: Miles Costain T on: 09/06/2017 11:03 AM   Modules accepted: Orders

## 2017-09-07 NOTE — Telephone Encounter (Signed)
Sent to the pharmacy by e-scribe. 

## 2017-09-09 ENCOUNTER — Other Ambulatory Visit: Payer: Self-pay | Admitting: Adult Health

## 2017-09-11 NOTE — Telephone Encounter (Signed)
Sent to the pharmacy by e-scribe. 

## 2017-09-14 NOTE — Telephone Encounter (Signed)
Please ensure he has an appt. When appropriate. Thanks.  I believe Pam has address this.  Candee Furbish, MD

## 2017-10-08 ENCOUNTER — Encounter: Payer: Self-pay | Admitting: Adult Health

## 2017-10-09 ENCOUNTER — Other Ambulatory Visit: Payer: Self-pay | Admitting: Adult Health

## 2017-10-09 DIAGNOSIS — B351 Tinea unguium: Secondary | ICD-10-CM

## 2017-10-09 DIAGNOSIS — Z760 Encounter for issue of repeat prescription: Secondary | ICD-10-CM

## 2017-10-09 MED ORDER — TERBINAFINE HCL 250 MG PO TABS: 250.0000 mg | ORAL_TABLET | Freq: Every day | ORAL | 0 refills | Status: DC

## 2017-10-13 ENCOUNTER — Other Ambulatory Visit: Payer: Self-pay | Admitting: Adult Health

## 2017-10-15 NOTE — Telephone Encounter (Signed)
Sent to the pharmacy by e-scribe. 

## 2017-11-01 ENCOUNTER — Other Ambulatory Visit: Payer: Self-pay

## 2017-11-01 ENCOUNTER — Inpatient Hospital Stay: Payer: Medicare HMO

## 2017-11-01 ENCOUNTER — Inpatient Hospital Stay: Payer: Medicare HMO | Attending: Hematology & Oncology | Admitting: Hematology & Oncology

## 2017-11-01 ENCOUNTER — Encounter: Payer: Self-pay | Admitting: Hematology & Oncology

## 2017-11-01 VITALS — BP 101/59 | HR 53 | Temp 98.5°F | Resp 17

## 2017-11-01 VITALS — BP 121/68 | HR 52 | Temp 98.4°F | Resp 18 | Wt 185.0 lb

## 2017-11-01 DIAGNOSIS — Z79899 Other long term (current) drug therapy: Secondary | ICD-10-CM | POA: Insufficient documentation

## 2017-11-01 DIAGNOSIS — I10 Essential (primary) hypertension: Secondary | ICD-10-CM

## 2017-11-01 DIAGNOSIS — D45 Polycythemia vera: Secondary | ICD-10-CM

## 2017-11-01 LAB — CBC WITH DIFFERENTIAL (CANCER CENTER ONLY)
ABS IMMATURE GRANULOCYTES: 0.04 10*3/uL (ref 0.00–0.07)
BASOS ABS: 0.1 10*3/uL (ref 0.0–0.1)
Basophils Relative: 1 %
Eosinophils Absolute: 0.5 10*3/uL (ref 0.0–0.5)
Eosinophils Relative: 6 %
HEMATOCRIT: 48.4 % (ref 39.0–52.0)
Hemoglobin: 15.2 g/dL (ref 13.0–17.0)
IMMATURE GRANULOCYTES: 1 %
LYMPHS ABS: 1.7 10*3/uL (ref 0.7–4.0)
Lymphocytes Relative: 20 %
MCH: 29.2 pg (ref 26.0–34.0)
MCHC: 31.4 g/dL (ref 30.0–36.0)
MCV: 93.1 fL (ref 80.0–100.0)
MONOS PCT: 13 %
Monocytes Absolute: 1.1 10*3/uL — ABNORMAL HIGH (ref 0.1–1.0)
NEUTROS ABS: 5 10*3/uL (ref 1.7–7.7)
NEUTROS PCT: 59 %
NRBC: 0 % (ref 0.0–0.2)
PLATELETS: 215 10*3/uL (ref 150–400)
RBC: 5.2 MIL/uL (ref 4.22–5.81)
RDW: 14.7 % (ref 11.5–15.5)
WBC Count: 8.3 10*3/uL (ref 4.0–10.5)

## 2017-11-01 LAB — CMP (CANCER CENTER ONLY)
ALBUMIN: 4 g/dL (ref 3.5–5.0)
ALT: 19 U/L (ref 0–44)
AST: 20 U/L (ref 15–41)
Alkaline Phosphatase: 89 U/L (ref 38–126)
Anion gap: 8 (ref 5–15)
BILIRUBIN TOTAL: 0.7 mg/dL (ref 0.3–1.2)
BUN: 26 mg/dL — AB (ref 8–23)
CHLORIDE: 104 mmol/L (ref 98–111)
CO2: 30 mmol/L (ref 22–32)
CREATININE: 1.44 mg/dL — AB (ref 0.61–1.24)
Calcium: 9.9 mg/dL (ref 8.9–10.3)
GFR, Est AFR Am: 53 mL/min — ABNORMAL LOW (ref >60)
GFR, Estimated: 45 mL/min — ABNORMAL LOW (ref >60)
GLUCOSE: 99 mg/dL (ref 70–99)
POTASSIUM: 5 mmol/L (ref 3.5–5.1)
Sodium: 142 mmol/L (ref 135–145)
Total Protein: 7.5 g/dL (ref 6.5–8.1)

## 2017-11-01 LAB — MAGNESIUM: Magnesium: 2 mg/dL (ref 1.7–2.4)

## 2017-11-01 LAB — IRON AND TIBC
Iron: 76 ug/dL (ref 42–163)
SATURATION RATIOS: 21 % (ref 20–55)
TIBC: 356 ug/dL (ref 202–409)
UIBC: 281 ug/dL (ref 117–376)

## 2017-11-01 LAB — FERRITIN: Ferritin: 22 ng/mL — ABNORMAL LOW (ref 24–336)

## 2017-11-01 NOTE — Progress Notes (Unsigned)
16g PIV to right AC. Phlebotomy tolerated well by pt. 546 cc taken from pt. Snacks and hydration provided. Procedure took 12 minutes. Pt observed x 30 minutes after procedure.

## 2017-11-01 NOTE — Progress Notes (Signed)
Hematology and Oncology Follow Up Visit  Alexander [name] [id number] 14-Dec-1940 77 y.o. 11/01/2017   Principle Diagnosis:  Polycythemia vera-JAK2 negative  Current Therapy:    Phlebotomy to maintain hematocrit below 45% - 12/29/2015  Aspirin 81 mg by mouth daily     Interim History:  Mr.  Alexander [name] is in for a followup.  He is doing pretty well.  The swim season is over now.  His pool is closed up for the fall and winter.  He has had no problems with cardiac issues.  He has had no chest wall pain.  He has had no nausea or vomiting.  He has had no headache.  There is been no leg swelling.  He has had no rashes.  Overall, his performance status is [name].  Medications:  Current Outpatient Medications:  .  ACCU-CHEK SOFTCLIX LANCETS lancets, Use to test blood glucose once daily, Disp: 100 each, Rfl: 3 .  allopurinol (ZYLOPRIM) 100 MG tablet, TAKE 1 TABLET BY MOUTH ONCE DAILY, Disp: 90 tablet, Rfl: 0 .  amLODipine (NORVASC) 10 MG tablet, TAKE 1 TABLET BY MOUTH ONCE DAILY, Disp: 90 tablet, Rfl: 0 .  aspirin EC 81 MG tablet, Take 1 tablet (81 mg total) by mouth daily., Disp: 90 tablet, Rfl: 3 .  azelastine (ASTELIN) 0.1 % nasal spray, Place 1 spray into both nostrils 2 (two) times daily. Use in each nostril as directed, Disp: 30 mL, Rfl: 3 .  Blood Glucose Monitoring Suppl (ACCU-CHEK AVIVA PLUS) w/Device KIT, Use to test blood glucose once daily, Disp: 1 kit, Rfl: 0 .  Coenzyme Q10 300 MG CAPS, Take 1 capsule by mouth daily. , Disp: , Rfl:  .  diphenhydrAMINE (BENADRYL) 2 % cream, Apply topically 3 (three) times daily as needed for itching., Disp: , Rfl:  .  glucose blood (ACCU-CHEK AVIVA) test strip, Use to test blood glucose once daily, Disp: 100 each, Rfl: 3 .  hydrocortisone (ANUCORT-HC) 25 MG suppository, Take as directed, Disp: 120 suppository, Rfl: 0 .  hydrocortisone (PROCTOZONE-HC) 2.5 % rectal cream, Place rectally 2 (two) times daily., Disp: 60 g, Rfl: 3 .  lisinopril  (PRINIVIL,ZESTRIL) 10 MG tablet, TAKE 1 TABLET BY MOUTH ONCE DAILY, Disp: 90 tablet, Rfl: 0 .  lisinopril (PRINIVIL,ZESTRIL) 10 MG tablet, TAKE 1 TABLET BY MOUTH ONCE DAILY, Disp: 90 tablet, Rfl: 3 .  pantoprazole (PROTONIX) 40 MG tablet, TAKE 1 TABLET BY MOUTH ONCE DAILY, Disp: 90 tablet, Rfl: 3 .  pravastatin (PRAVACHOL) 40 MG tablet, TAKE 1 TABLET BY MOUTH ONCE DAILY, Disp: 90 tablet, Rfl: 3 .  terbinafine (LAMISIL) 250 MG tablet, Take 1 tablet (250 mg total) by mouth daily., Disp: 90 tablet, Rfl: 0 .  triamcinolone (NASACORT) 55 MCG/ACT AERO nasal inhaler, Place 2 sprays into the nose daily., Disp: 1 Inhaler, Rfl: 12  Allergies:  Allergies  Allergen Reactions  . Metformin And Related Diarrhea    Past Medical History, Surgical history, Social history, and Family History were reviewed and updated.  Review of Systems: Review of Systems  Constitutional: Negative.   HENT: Negative.   Eyes: Negative.   Respiratory: Negative.   Cardiovascular: Negative.   Gastrointestinal: Negative.   Genitourinary: Negative.   Musculoskeletal: Negative.   Skin: Negative.   Neurological: Negative.   Endo/Heme/Allergies: Negative.   Psychiatric/Behavioral: Negative.     Physical Exam:  weight is 185 lb (83.9 kg). His oral temperature is 98.4 F (36.9 C). His blood pressure is 121/68 and his pulse is 52 (abnormal). His  respiration is 18 and oxygen saturation is 100%.   Physical Exam  Constitutional: He is oriented to person, place, and time.  HENT:  Head: Normocephalic and atraumatic.  Mouth/Throat: Oropharynx is clear and moist.  Eyes: Pupils are equal, round, and reactive to light. EOM are normal.  Neck: Normal range of motion.  Cardiovascular: Normal rate, regular rhythm and normal heart sounds.  Pulmonary/Chest: Effort normal and breath sounds normal.  Abdominal: Soft. Bowel sounds are normal.  Musculoskeletal: Normal range of motion. He exhibits no edema, tenderness or deformity.   Lymphadenopathy:    He has no cervical adenopathy.  Neurological: He is alert and oriented to person, place, and time.  Skin: Skin is warm and dry. No rash noted. No erythema.  Psychiatric: He has a normal mood and affect. His behavior is normal. Judgment and thought content normal.  Vitals reviewed.    Lab Results  Component Value Date   WBC 8.3 11/01/2017   HGB 15.2 11/01/2017   HCT 48.4 11/01/2017   MCV 93.1 11/01/2017   PLT 215 11/01/2017     Chemistry      Component Value Date/Time   NA 140 09/06/2017 1103   NA 144 11/29/2016 1054   NA 139 12/29/2015 0954   K 5.0 09/06/2017 1103   K 4.2 11/29/2016 1054   K 4.1 12/29/2015 0954   CL 104 09/06/2017 1103   CL 102 11/29/2016 1054   CO2 31 09/06/2017 1103   CO2 30 11/29/2016 1054   CO2 26 12/29/2015 0954   BUN 25 (H) 09/06/2017 1103   BUN 22 11/29/2016 1054   BUN 20.0 12/29/2015 0954   CREATININE 1.17 09/06/2017 1103   CREATININE 1.50 (H) 08/30/2017 0954   CREATININE 1.2 11/29/2016 1054   CREATININE 1.3 12/29/2015 0954      Component Value Date/Time   CALCIUM 9.8 09/06/2017 1103   CALCIUM 9.3 11/29/2016 1054   CALCIUM 9.5 12/29/2015 0954   ALKPHOS 72 09/06/2017 1103   ALKPHOS 92 (H) 11/29/2016 1054   ALKPHOS 98 12/29/2015 0954   AST 13 09/06/2017 1103   AST 24 08/30/2017 0954   AST 16 12/29/2015 0954   ALT 14 09/06/2017 1103   ALT 22 08/30/2017 0954   ALT 20 11/29/2016 1054   ALT 13 12/29/2015 0954   BILITOT 0.7 09/06/2017 1103   BILITOT 0.9 08/30/2017 0954   BILITOT 0.71 12/29/2015 0954      Impression and Plan: Alexander [name] is 77 year old gentleman. He has polycythemia. He is JAK2 negative however.  We will go ahead and phlebotomize him today.  By doing this, we will then get him through the holiday season.  It is always fun talking to he and his wife.  They are very knowledgeable about Alexander [name] tradition and history.  Alexander Napoleon, MD 10/31/201910:52 AM

## 2017-11-12 ENCOUNTER — Encounter: Payer: Self-pay | Admitting: Cardiology

## 2017-11-13 ENCOUNTER — Other Ambulatory Visit: Payer: Self-pay | Admitting: Adult Health

## 2017-11-13 NOTE — Telephone Encounter (Signed)
Sent to the pharmacy by e-scribe. 

## 2017-11-19 ENCOUNTER — Encounter: Payer: Self-pay | Admitting: Adult Health

## 2017-11-21 ENCOUNTER — Other Ambulatory Visit: Payer: Self-pay | Admitting: Adult Health

## 2017-11-22 NOTE — Telephone Encounter (Signed)
Sent to the pharmacy by e-scribe. 

## 2017-12-03 ENCOUNTER — Encounter: Payer: Self-pay | Admitting: Cardiology

## 2017-12-03 ENCOUNTER — Ambulatory Visit (INDEPENDENT_AMBULATORY_CARE_PROVIDER_SITE_OTHER): Payer: Medicare HMO | Admitting: Cardiology

## 2017-12-03 VITALS — BP 120/74 | HR 57 | Ht 69.5 in | Wt 182.0 lb

## 2017-12-03 DIAGNOSIS — I2510 Atherosclerotic heart disease of native coronary artery without angina pectoris: Secondary | ICD-10-CM | POA: Diagnosis not present

## 2017-12-03 DIAGNOSIS — I10 Essential (primary) hypertension: Secondary | ICD-10-CM

## 2017-12-03 DIAGNOSIS — D45 Polycythemia vera: Secondary | ICD-10-CM

## 2017-12-03 DIAGNOSIS — I2583 Coronary atherosclerosis due to lipid rich plaque: Secondary | ICD-10-CM | POA: Diagnosis not present

## 2017-12-03 NOTE — Patient Instructions (Signed)
Medication Instructions:  The current medical regimen is effective;  continue present plan and medications.  If you need a refill on your cardiac medications before your next appointment, please call your pharmacy.   Follow-Up: At [facility], you and your health needs are our priority.  As part of our continuing mission to provide you with exceptional heart care, we have created designated Provider Care Teams.  These Care Teams include your primary Cardiologist (physician) and Advanced Practice Providers (APPs -  Physician Assistants and Nurse Practitioners) who all work together to provide you with the care you need, when you need it. You will need a follow up appointment in 12 months.  Please call our office 2 months in advance to schedule this appointment.  You may see Dr Skains. or one of the following Advanced Practice Providers on your designated Care Team:   Lori Gerhardt, NP Laura Ingold, NP . Jill McDaniel, NP  Thank you for choosing [facility]!!      

## 2017-12-03 NOTE — Progress Notes (Signed)
Cardiology Office Note:    Date:  12/03/2017   ID:  Alexander [name], DOB 1941/01/01, MRN [id number]  PCP:  [name], NP  Cardiologist:  No primary care provider on file.  Electrophysiologist:  None   Referring MD: [name], NP     History of Present Illness:    Alexander [name] is a 77 y.o. male here for the follow-up of coronary artery disease.  Has polycythemia vera followed by Dr. Marin Olp, hypertension hyperlipidemia prior GI bleed.  Left heart catheterization in 2015 showed diffuse ectasia but no significant obstructive disease.  30% left main plaque.  EF was normal.  Medical management.  Enjoys swimming in his saline pool at home during the summertime.  Denies any chest pain fevers chills nausea vomiting syncope bleeding.  He travels to [place] almost every year.  His daughter is there.  Has not been there in the last 2 years.  Has several grandchildren.  Prior event monitor performed showed no atrial fibrillation.  Asymptomatic bradycardia.  He feels he is doing very well.  Swims for 2 hours in the summertime.  They have Bluetooth waterproof headphones.  His main complaint is some tendinitis in his middle finger right hand.  Studies:  - LHC (05/20/13):  Proximal left main 30%, mildly ectatic distal left main, proximal LAD with mild ectasia, proximal circumflex with moderate ectasia, proximal, mid and distal RCA with severe ectasia, EF 55-65%.    - Echo (10/2008):  EF 60-65%, Gr 1 DD  - Nuclear (12/2012):  Normal study, EF 59%  Past Medical History:  Diagnosis Date  . Arthritis    "minor; shoulders primarily" (05/20/2013)  . CAD (coronary artery disease)    a. 05/2013 - Chest pain/unstable angina and dynamic EKG changes showing cath showing atherosclerotic coronary artery disease manifested as diffuse ectasia, normal EF.  . Diverticulosis of colon   . GERD (gastroesophageal reflux disease)   . GI bleed    secondary to Aspirin  . Gout   . Hematuria,  microscopic   . Hemorrhoids   . Hyperglycemia   . Hyperlipidemia   . Hypertension   . Lumbar back pain   . Microalbuminuria   . Overweight(278.02)   . Polycythemia rubra vera ([place]   . Tubular adenoma of colon 07/2012    Past Surgical History:  Procedure Laterality Date  . CARDIAC CATHETERIZATION  05/20/2013  . CATARACT EXTRACTION W/ INTRAOCULAR LENS  IMPLANT, BILATERAL Bilateral 2008  . CYSTECTOMY  ~ 2000   " SEBACEOUS cyst removed from between shoulder blades"  . EXCISIONAL HEMORRHOIDECTOMY  1970's  . INGUINAL HERNIA REPAIR Right ~ 2010  . LEFT HEART CATHETERIZATION WITH CORONARY ANGIOGRAM N/A 05/20/2013   Procedure: LEFT HEART CATHETERIZATION WITH CORONARY ANGIOGRAM;  Surgeon: Peter M Martinique, MD;  [facility] CATH LAB;  Service: Cardiovascular;  Laterality: N/A;  . TONSILLECTOMY AND ADENOIDECTOMY  1940's  . VASECTOMY      Current Medications: Current Meds  Medication Sig  . ACCU-CHEK SOFTCLIX LANCETS lancets Use to test blood glucose once daily  . allopurinol (ZYLOPRIM) 100 MG tablet TAKE 1 TABLET BY MOUTH ONCE DAILY  . amLODipine (NORVASC) 10 MG tablet TAKE 1 TABLET BY MOUTH ONCE DAILY  . aspirin EC 81 MG tablet Take 1 tablet (81 mg total) by mouth daily.  [name] azelastine (ASTELIN) 0.1 % nasal spray Place 1 spray into both nostrils 2 (two) times daily. Use in each nostril as directed  . Blood Glucose Monitoring Suppl (ACCU-CHEK AVIVA PLUS) w/Device KIT  Use to test blood glucose once daily  . Coenzyme Q10 300 MG CAPS Take 1 capsule by mouth daily.   . diphenhydrAMINE (BENADRYL) 2 % cream Apply topically 3 (three) times daily as needed for itching.  [name] glucose blood (ACCU-CHEK AVIVA) test strip Use to test blood glucose once daily  . hydrocortisone (ANUCORT-HC) 25 MG suppository Take as directed  . hydrocortisone (PROCTOZONE-HC) 2.5 % rectal cream Place rectally 2 (two) times daily.  [name] lisinopril (PRINIVIL,ZESTRIL) 10 MG tablet TAKE 1 TABLET BY MOUTH ONCE DAILY  . pantoprazole  (PROTONIX) 40 MG tablet TAKE 1 TABLET BY MOUTH ONCE DAILY  . pravastatin (PRAVACHOL) 40 MG tablet TAKE 1 TABLET BY MOUTH ONCE DAILY  . terbinafine (LAMISIL) 250 MG tablet Take 1 tablet (250 mg total) by mouth daily.  [name] triamcinolone (NASACORT) 55 MCG/ACT AERO nasal inhaler Place 2 sprays into the nose daily.     Allergies:   Metformin and related   Social History   Socioeconomic History  . Marital status: Married    Spouse name: Not on file  . Number of children: 3  . Years of education: Not on file  . Highest education level: Not on file  Occupational History  . Occupation: retired Lobbyist: RETIRED  Social Needs  . Financial resource strain: Not on file  . Food insecurity:    Worry: Not on file    Inability: Not on file  . Transportation needs:    Medical: Not on file    Non-medical: Not on file  Tobacco Use  . Smoking status: Never Smoker  . Smokeless tobacco: Never Used  . Tobacco comment: NEVER USED TOBACCO  Substance and Sexual Activity  . Alcohol use: No    Alcohol/week: 0.0 standard drinks  . Drug use: No  . Sexual activity: Yes    Partners: Female  Lifestyle  . Physical activity:    Days per week: Not on file    Minutes per session: Not on file  . Stress: Not on file  Relationships  . Social connections:    Talks on phone: Not on file    Gets together: Not on file    Attends religious service: Not on file    Active member of club or organization: Not on file    Attends meetings of clubs or organizations: Not on file    Relationship status: Not on file  Other Topics Concern  . Not on file  Social History Narrative   Marriedfor 76 years    Daughter, Higher education careers adviser (pediatrician--lives in [place], [place]), one in [place] - Engineer, water. One in [place] - Chief Strategy Officer            Family History: The patient's family history includes Cancer in his maternal uncle; Colon cancer in his mother; Diabetes in his father and mother; Heart disease (age of onset: 62) in his  father; Stomach cancer in his maternal grandfather.  ROS:   Please see the history of present illness.     All other systems reviewed and are negative.  EKGs/Labs/Other Studies Reviewed:    The following studies were reviewed today: Prior EKG lab work event monitor.  LDL 87 A1c 6.2 hemoglobin 15.2 creatinine 1.4 potassium 5  EKG:  EKG is ordered today.  The ekg ordered today demonstrates sinus bradycardia rate 57 with no other abnormalities personally reviewed and interpreted no significant changes.  Recent Labs: 09/06/2017: TSH 0.64 11/01/2017: ALT 19; BUN 26; Creatinine 1.44; Hemoglobin 15.2; Magnesium 2.0; Platelet Count 215;  Potassium 5.0; Sodium 142  Recent Lipid Panel    Component Value Date/Time   CHOL 155 09/06/2017 1103   TRIG 67.0 09/06/2017 1103   HDL 54.60 09/06/2017 1103   CHOLHDL 3 09/06/2017 1103   VLDL 13.4 09/06/2017 1103   LDLCALC 87 09/06/2017 1103    Physical Exam:    VS:  BP 120/74   Pulse (!) 57   Ht 5' 9.5" (1.765 m)   Wt 182 lb (82.6 kg)   BMI 26.49 kg/m     Wt Readings from Last 3 Encounters:  12/03/17 182 lb (82.6 kg)  11/01/17 185 lb (83.9 kg)  09/06/17 175 lb (79.4 kg)     GEN:  Well nourished, well developed in no acute distress HEENT: Normal NECK: No JVD; No carotid bruits LYMPHATICS: No lymphadenopathy CARDIAC: RRR, no murmurs, rubs, gallops RESPIRATORY:  Clear to auscultation without rales, wheezing or rhonchi  ABDOMEN: Soft, non-tender, non-distended MUSCULOSKELETAL:  No edema; No deformity  SKIN: Warm and dry NEUROLOGIC:  Alert and oriented x 3 PSYCHIATRIC:  Normal affect   ASSESSMENT:    1. Coronary artery disease due to lipid rich plaque   2. Polycythemia vera ([place])   3. Essential hypertension    PLAN:    In order of problems listed above:  Coronary artery disease - Mild nonobstructive disease seen on catheterization as above.  Continue with low-dose aspirin, secondary risk factor prevention.  Statin.  Hypertension  control.  Stay active.  Doing very well.  Polycythemia rubra vera - Hematology has been following.  Aspirin.  Doing well.  Hypertension/hyperlipidemia - Pravastatin, losartan.  Amlodipine.  Stable.  If his blood pressure gets too low, can always pull back on the amlodipine.  Bradycardia -Staying off of beta-blockers.  Asymptomatic seen on monitor.  One year follow-up.   Medication Adjustments/Labs and Tests Ordered: Current medicines are reviewed at length with the patient today.  Concerns regarding medicines are outlined above.  Orders Placed This Encounter  Procedures  . EKG 12-Lead   No orders of the defined types were placed in this encounter.   Patient Instructions  Medication Instructions:  The current medical regimen is effective;  continue present plan and medications.  If you need a refill on your cardiac medications before your next appointment, please call your pharmacy.   Follow-Up: At [facility], you and your health needs are our priority.  As part of our continuing mission to provide you with exceptional heart care, we have created designated Provider Care Teams.  These Care Teams include your primary Cardiologist (physician) and Advanced Practice Providers (APPs -  Physician Assistants and Nurse Practitioners) who all work together to provide you with the care you need, when you need it. You will need a follow up appointment in 12 months.  Please call our office 2 months in advance to schedule this appointment.  You may see Dr Marlou Porch or one of the following Advanced Practice Providers on your designated Care Team:   Truitt Merle, NP Cecilie Kicks, NP . Kathyrn Drown, NP  Thank you for choosing [facility]!!        Signed, Candee Furbish, MD  12/03/2017 12:01 PM    [place]

## 2017-12-09 ENCOUNTER — Other Ambulatory Visit: Payer: Self-pay | Admitting: Adult Health

## 2017-12-11 ENCOUNTER — Encounter: Payer: Self-pay | Admitting: Adult Health

## 2017-12-11 MED ORDER — ACCU-CHEK AVIVA PLUS W/DEVICE KIT: PACK | 0 refills | Status: DC

## 2017-12-11 NOTE — Telephone Encounter (Signed)
Sent to the pharmacy by e-scribe. 

## 2017-12-11 NOTE — Telephone Encounter (Signed)
Received another error message.  Rx faxed to [facility].  Nothing further needed.

## 2017-12-11 NOTE — Addendum Note (Signed)
Addended by: Miles Costain T on: 12/11/2017 01:16 PM   Modules accepted: Orders

## 2017-12-11 NOTE — Addendum Note (Signed)
Addended by: Miles Costain T on: 12/11/2017 01:30 PM   Modules accepted: Orders

## 2017-12-11 NOTE — Telephone Encounter (Signed)
Received message that the prescription failed when first sent.  Resent to the pharmacy.

## 2017-12-12 ENCOUNTER — Encounter: Payer: Self-pay | Admitting: Adult Health

## 2017-12-12 MED ORDER — ACCU-CHEK SOFTCLIX LANCETS MISC: 3 refills | Status: DC

## 2017-12-12 MED ORDER — GLUCOSE BLOOD VI STRP: ORAL_STRIP | 3 refills | Status: DC

## 2017-12-19 DIAGNOSIS — E118 Type 2 diabetes mellitus with unspecified complications: Secondary | ICD-10-CM | POA: Diagnosis not present

## 2017-12-19 NOTE — Telephone Encounter (Signed)
Copied from [phone]. Topic: Quick Communication - See Telephone Encounter >> Dec 19, 2017  9:16 AM Ahmed Prima L wrote: CRM for notification. See Telephone encounter for: 12/19/17.  [employer] [facility] called and would like a nurse to call back to give a verbal for diabetic supplies. The number is [phone].

## 2017-12-19 NOTE — Telephone Encounter (Signed)
Please advise Misty

## 2017-12-19 NOTE — Telephone Encounter (Signed)
Alexander [name] with [facility] called back and said that it was never received on 12/11 and would like it faxed instead of phoned in. [phone]

## 2017-12-20 ENCOUNTER — Encounter: Payer: Self-pay | Admitting: Adult Health

## 2017-12-25 ENCOUNTER — Other Ambulatory Visit: Payer: Self-pay | Admitting: Adult Health

## 2017-12-25 DIAGNOSIS — Z760 Encounter for issue of repeat prescription: Secondary | ICD-10-CM

## 2017-12-25 MED ORDER — TERBINAFINE HCL 250 MG PO TABS: 250.0000 mg | ORAL_TABLET | Freq: Every day | ORAL | 0 refills | Status: DC

## 2018-01-29 DIAGNOSIS — H1013 Acute atopic conjunctivitis, bilateral: Secondary | ICD-10-CM | POA: Diagnosis not present

## 2018-01-31 ENCOUNTER — Other Ambulatory Visit: Payer: Medicare HMO

## 2018-01-31 ENCOUNTER — Ambulatory Visit: Payer: Medicare HMO | Admitting: Hematology & Oncology

## 2018-02-01 ENCOUNTER — Inpatient Hospital Stay: Payer: Medicare HMO | Attending: Hematology & Oncology

## 2018-02-01 ENCOUNTER — Other Ambulatory Visit: Payer: Self-pay

## 2018-02-01 ENCOUNTER — Inpatient Hospital Stay: Payer: Medicare HMO

## 2018-02-01 ENCOUNTER — Inpatient Hospital Stay (HOSPITAL_BASED_OUTPATIENT_CLINIC_OR_DEPARTMENT_OTHER): Payer: Medicare HMO | Admitting: Hematology & Oncology

## 2018-02-01 ENCOUNTER — Encounter: Payer: Self-pay | Admitting: Hematology & Oncology

## 2018-02-01 VITALS — BP 131/82 | HR 51 | Temp 98.8°F | Resp 18 | Ht 69.5 in | Wt 183.2 lb

## 2018-02-01 DIAGNOSIS — D45 Polycythemia vera: Secondary | ICD-10-CM

## 2018-02-01 LAB — CMP (CANCER CENTER ONLY)
ALT: 12 U/L (ref 0–44)
AST: 14 U/L — AB (ref 15–41)
Albumin: 4.7 g/dL (ref 3.5–5.0)
Alkaline Phosphatase: 80 U/L (ref 38–126)
Anion gap: 5 (ref 5–15)
BUN: 23 mg/dL (ref 8–23)
CO2: 30 mmol/L (ref 22–32)
Calcium: 10.1 mg/dL (ref 8.9–10.3)
Chloride: 105 mmol/L (ref 98–111)
Creatinine: 1.2 mg/dL (ref 0.61–1.24)
GFR, Est AFR Am: >60 mL/min (ref >60)
GFR, Estimated: 58 mL/min — ABNORMAL LOW (ref >60)
Glucose, Bld: 108 mg/dL — ABNORMAL HIGH (ref 70–99)
Potassium: 4.5 mmol/L (ref 3.5–5.1)
Sodium: 140 mmol/L (ref 135–145)
TOTAL PROTEIN: 7.3 g/dL (ref 6.5–8.1)
Total Bilirubin: 0.6 mg/dL (ref 0.3–1.2)

## 2018-02-01 LAB — CBC WITH DIFFERENTIAL (CANCER CENTER ONLY)
Abs Immature Granulocytes: 0.02 10*3/uL (ref 0.00–0.07)
Basophils Absolute: 0.1 10*3/uL (ref 0.0–0.1)
Basophils Relative: 1 %
Eosinophils Absolute: 0.3 10*3/uL (ref 0.0–0.5)
Eosinophils Relative: 4 %
HCT: 45 % (ref 39.0–52.0)
Hemoglobin: 14.6 g/dL (ref 13.0–17.0)
IMMATURE GRANULOCYTES: 0 %
Lymphocytes Relative: 21 %
Lymphs Abs: 1.7 10*3/uL (ref 0.7–4.0)
MCH: 30.4 pg (ref 26.0–34.0)
MCHC: 32.4 g/dL (ref 30.0–36.0)
MCV: 93.6 fL (ref 80.0–100.0)
Monocytes Absolute: 1 10*3/uL (ref 0.1–1.0)
Monocytes Relative: 12 %
NEUTROS PCT: 62 %
Neutro Abs: 5.1 10*3/uL (ref 1.7–7.7)
Platelet Count: 222 10*3/uL (ref 150–400)
RBC: 4.81 MIL/uL (ref 4.22–5.81)
RDW: 14.6 % (ref 11.5–15.5)
WBC Count: 8.2 10*3/uL (ref 4.0–10.5)
nRBC: 0 % (ref 0.0–0.2)

## 2018-02-01 LAB — IRON AND TIBC
Iron: 86 ug/dL (ref 42–163)
SATURATION RATIOS: 26 % (ref 20–55)
TIBC: 335 ug/dL (ref 202–409)
UIBC: 249 ug/dL (ref 117–376)

## 2018-02-01 LAB — FERRITIN: Ferritin: 28 ng/mL (ref 24–336)

## 2018-02-01 NOTE — Progress Notes (Signed)
Hematology and Oncology Follow Up Visit  Alexander [name] [id number] Sep 24, 1940 78 y.o. 02/01/2018   Principle Diagnosis:  Polycythemia vera-JAK2 negative  Current Therapy:    Phlebotomy to maintain hematocrit below 45% - 12/29/2015  Aspirin 81 mg by mouth daily     Interim History:  [name] is in for a followup.  We last saw him back in late October.  He got through all the holidays without any difficulties.  He is feeling well.  He has had no issues with cough or shortness of breath.  His heart status is doing okay.  He is watching his cholesterol.  He is lost a little bit of weight.  He is watching his diet carefully.  There is been no issues with nausea or vomiting.  He has had no change in bowel or bladder habits.  Overall, his performance status is [name].  Medications:  Current Outpatient Medications:  .  ACCU-CHEK SOFTCLIX LANCETS lancets, Use to test blood glucose once daily, Disp: 100 each, Rfl: 3 .  allopurinol (ZYLOPRIM) 100 MG tablet, TAKE 1 TABLET BY MOUTH ONCE DAILY, Disp: 90 tablet, Rfl: 2 .  amLODipine (NORVASC) 10 MG tablet, TAKE 1 TABLET BY MOUTH ONCE DAILY, Disp: 90 tablet, Rfl: 2 .  aspirin EC 81 MG tablet, Take 1 tablet (81 mg total) by mouth daily., Disp: 90 tablet, Rfl: 3 .  azelastine (ASTELIN) 0.1 % nasal spray, Place 1 spray into both nostrils 2 (two) times daily. Use in each nostril as directed, Disp: 30 mL, Rfl: 3 .  Blood Glucose Monitoring Suppl (ACCU-CHEK AVIVA PLUS) w/Device KIT, Use to test blood glucose once daily, Disp: 1 kit, Rfl: 0 .  Coenzyme Q10 300 MG CAPS, Take 1 capsule by mouth daily. , Disp: , Rfl:  .  diphenhydrAMINE (BENADRYL) 2 % cream, Apply topically 3 (three) times daily as needed for itching., Disp: , Rfl:  .  glucose blood (ACCU-CHEK AVIVA) test strip, Use to test blood glucose once daily, Disp: 100 each, Rfl: 3 .  hydrocortisone (ANUCORT-HC) 25 MG suppository, Take as directed, Disp: 120 suppository, Rfl: 0 .   hydrocortisone (PROCTOZONE-HC) 2.5 % rectal cream, Place rectally 2 (two) times daily., Disp: 60 g, Rfl: 3 .  lisinopril (PRINIVIL,ZESTRIL) 10 MG tablet, TAKE 1 TABLET BY MOUTH ONCE DAILY, Disp: 90 tablet, Rfl: 3 .  pantoprazole (PROTONIX) 40 MG tablet, TAKE 1 TABLET BY MOUTH ONCE DAILY, Disp: 90 tablet, Rfl: 3 .  pravastatin (PRAVACHOL) 40 MG tablet, TAKE 1 TABLET BY MOUTH ONCE DAILY, Disp: 90 tablet, Rfl: 3 .  terbinafine (LAMISIL) 250 MG tablet, Take 1 tablet (250 mg total) by mouth daily., Disp: 30 tablet, Rfl: 0 .  triamcinolone (NASACORT) 55 MCG/ACT AERO nasal inhaler, Place 2 sprays into the nose daily., Disp: 1 Inhaler, Rfl: 12  Allergies:  Allergies  Allergen Reactions  . Metformin And Related Diarrhea    Past Medical History, Surgical history, Social history, and Family History were reviewed and updated.  Review of Systems: Review of Systems  Constitutional: Negative.   HENT: Negative.   Eyes: Negative.   Respiratory: Negative.   Cardiovascular: Negative.   Gastrointestinal: Negative.   Genitourinary: Negative.   Musculoskeletal: Negative.   Skin: Negative.   Neurological: Negative.   Endo/Heme/Allergies: Negative.   Psychiatric/Behavioral: Negative.     Physical Exam:  vitals were not taken for this visit.   Physical Exam Vitals signs reviewed.  HENT:     Head: Normocephalic and atraumatic.  Eyes:  Pupils: Pupils are equal, round, and reactive to light.  Neck:     Musculoskeletal: Normal range of motion.  Cardiovascular:     Rate and Rhythm: Normal rate and regular rhythm.     Heart sounds: Normal heart sounds.  Pulmonary:     Effort: Pulmonary effort is normal.     Breath sounds: Normal breath sounds.  Abdominal:     General: Bowel sounds are normal.     Palpations: Abdomen is soft.  Musculoskeletal: Normal range of motion.        General: No tenderness or deformity.  Lymphadenopathy:     Cervical: No cervical adenopathy.  Skin:    General: Skin  is warm and dry.     Findings: No erythema or rash.  Neurological:     Mental Status: He is alert and oriented to person, place, and time.  Psychiatric:        Behavior: Behavior normal.        Thought Content: Thought content normal.        Judgment: Judgment normal.      Lab Results  Component Value Date   WBC 8.2 02/01/2018   HGB 14.6 02/01/2018   HCT 45.0 02/01/2018   MCV 93.6 02/01/2018   PLT 222 02/01/2018     Chemistry      Component Value Date/Time   NA 140 02/01/2018 0957   NA 144 11/29/2016 1054   NA 139 12/29/2015 0954   K 4.5 02/01/2018 0957   K 4.2 11/29/2016 1054   K 4.1 12/29/2015 0954   CL 105 02/01/2018 0957   CL 102 11/29/2016 1054   CO2 30 02/01/2018 0957   CO2 30 11/29/2016 1054   CO2 26 12/29/2015 0954   BUN 23 02/01/2018 0957   BUN 22 11/29/2016 1054   BUN 20.0 12/29/2015 0954   CREATININE 1.20 02/01/2018 0957   CREATININE 1.2 11/29/2016 1054   CREATININE 1.3 12/29/2015 0954      Component Value Date/Time   CALCIUM 10.1 02/01/2018 0957   CALCIUM 9.3 11/29/2016 1054   CALCIUM 9.5 12/29/2015 0954   ALKPHOS 80 02/01/2018 0957   ALKPHOS 92 (H) 11/29/2016 1054   ALKPHOS 98 12/29/2015 0954   AST 14 (L) 02/01/2018 0957   AST 16 12/29/2015 0954   ALT 12 02/01/2018 0957   ALT 20 11/29/2016 1054   ALT 13 12/29/2015 0954   BILITOT 0.6 02/01/2018 0957   BILITOT 0.71 12/29/2015 0954      Impression and Plan: Alexander [name] is 78 year old gentleman. He has polycythemia. He is JAK2 negative however.  For right now, I think we will let him go and not be phlebotomized.  He is only with a hematocrit of 45%.  I really do not think that this is going to be a problem for him.  I will get him back in 2 months.  We may have to phlebotomize him when we see him back in April.  As always, it is a lot of fun talking to he and his wife about what is going on in the world   [name], MD 1/31/202011:16 AM

## 2018-02-05 ENCOUNTER — Ambulatory Visit (INDEPENDENT_AMBULATORY_CARE_PROVIDER_SITE_OTHER): Payer: Medicare HMO | Admitting: Adult Health

## 2018-02-05 ENCOUNTER — Encounter: Payer: Self-pay | Admitting: Adult Health

## 2018-02-05 VITALS — BP 122/74 | Temp 98.9°F | Wt 177.0 lb

## 2018-02-05 DIAGNOSIS — R6889 Other general symptoms and signs: Secondary | ICD-10-CM

## 2018-02-05 LAB — POC INFLUENZA A&B (BINAX/QUICKVUE)
Influenza A, POC: NEGATIVE
Influenza B, POC: NEGATIVE

## 2018-02-05 MED ORDER — ALBUTEROL SULFATE HFA 108 (90 BASE) MCG/ACT IN AERS: 2.0000 | INHALATION_SPRAY | Freq: Four times a day (QID) | RESPIRATORY_TRACT | 2 refills | Status: DC | PRN

## 2018-02-05 NOTE — Progress Notes (Signed)
Subjective:    Patient ID: Alexander [name], male    DOB: [birth year], 78 y.o.   MRN: [id number]  HPI  78 year old male who  has a past medical history of Arthritis, CAD (coronary artery disease), Diverticulosis of colon, GERD (gastroesophageal reflux disease), GI bleed, Gout, Hematuria, microscopic, Hemorrhoids, Hyperglycemia, Hyperlipidemia, Hypertension, Lumbar back pain, Microalbuminuria, Overweight(278.02), Polycythemia rubra vera ([place]), and Tubular adenoma of colon (07/2012).   He presents to the office today for an acute issue of concern for influenza.  His symptoms started yesterday and consist of productive cough, nasal congestion, body aches, fever up to 99, chills, and shortness of breath.  He does report that he feels as though he is improving and about 25% better than he was yesterday.  He experienced some shortness of breath yesterday and used an outdated albuterol inhaler that he had at home which he felt worked to help with the shortness of breath.  He has not had any shortness of breath since.  At home he is also been using Tylenol to help with symptom relief and finds this helpful.  He denies any nausea or vomiting or diarrhea  Review of Systems See HPI   Past Medical History:  Diagnosis Date  . Arthritis    "minor; shoulders primarily" (05/20/2013)  . CAD (coronary artery disease)    a. 05/2013 - Chest pain/unstable angina and dynamic EKG changes showing cath showing atherosclerotic coronary artery disease manifested as diffuse ectasia, normal EF.  . Diverticulosis of colon   . GERD (gastroesophageal reflux disease)   . GI bleed    secondary to Aspirin  . Gout   . Hematuria, microscopic   . Hemorrhoids   . Hyperglycemia   . Hyperlipidemia   . Hypertension   . Lumbar back pain   . Microalbuminuria   . Overweight(278.02)   . Polycythemia rubra vera ([place])   . Tubular adenoma of colon 07/2012    Social History   Socioeconomic History  . Marital status: Married    Spouse name: Not on file  . Number of children: 3  . Years of education: Not on file  . Highest education level: Not on file  Occupational History  . Occupation: retired Lobbyist: RETIRED  Social Needs  . Financial resource strain: Not on file  . Food insecurity:    Worry: Not on file    Inability: Not on file  . Transportation needs:    Medical: Not on file    Non-medical: Not on file  Tobacco Use  . Smoking status: Never Smoker  . Smokeless tobacco: Never Used  . Tobacco comment: NEVER USED TOBACCO  Substance and Sexual Activity  . Alcohol use: No    Alcohol/week: 0.0 standard drinks  . Drug use: No  . Sexual activity: Yes    Partners: Female  Lifestyle  . Physical activity:    Days per week: Not on file    Minutes per session: Not on file  . Stress: Not on file  Relationships  . Social connections:    Talks on phone: Not on file    Gets together: Not on file    Attends religious service: Not on file    Active member of club or organization: Not on file    Attends meetings of clubs or organizations: Not on file    Relationship status: Not on file  . Intimate partner violence:    Fear of current or ex partner: Not on file  Emotionally abused: Not on file    Physically abused: Not on file    Forced sexual activity: Not on file  Other Topics Concern  . Not on file  Social History Narrative   Marriedfor 48 years    Daughter, [name] (pediatrician--lives in [place], [place]), one in [place] - Engineer, water. One in [place] - Chief Strategy Officer           Past Surgical History:  Procedure Laterality Date  . CARDIAC CATHETERIZATION  05/20/2013  . CATARACT EXTRACTION W/ INTRAOCULAR LENS  IMPLANT, BILATERAL Bilateral 2008  . CYSTECTOMY  ~ 2000   " SEBACEOUS cyst removed from between shoulder blades"  . EXCISIONAL HEMORRHOIDECTOMY  1970's  . INGUINAL HERNIA REPAIR Right ~ 2010  . LEFT HEART CATHETERIZATION WITH CORONARY ANGIOGRAM N/A 05/20/2013   Procedure: LEFT HEART  CATHETERIZATION WITH CORONARY ANGIOGRAM;  Surgeon: Peter M Martinique, MD;  Location: [facility] CATH LAB;  Service: Cardiovascular;  Laterality: N/A;  . TONSILLECTOMY AND ADENOIDECTOMY  1940's  . VASECTOMY      Family History  Problem Relation Age of Onset  . Colon cancer Mother        died at 96, colorectal  . Diabetes Mother   . Heart disease Father 70       MI  . Diabetes Father   . Stomach cancer Maternal Grandfather   . Cancer Maternal Uncle     Allergies  Allergen Reactions  . Metformin And Related Diarrhea    Current Outpatient Medications on File Prior to Visit  Medication Sig Dispense Refill  . ACCU-CHEK SOFTCLIX LANCETS lancets Use to test blood glucose once daily 100 each 3  . allopurinol (ZYLOPRIM) 100 MG tablet TAKE 1 TABLET BY MOUTH ONCE DAILY 90 tablet 2  . amLODipine (NORVASC) 10 MG tablet TAKE 1 TABLET BY MOUTH ONCE DAILY 90 tablet 2  . aspirin EC 81 MG tablet Take 1 tablet (81 mg total) by mouth daily. 90 tablet 3  . azelastine (ASTELIN) 0.1 % nasal spray Place 1 spray into both nostrils 2 (two) times daily. Use in each nostril as directed 30 mL 3  . Blood Glucose Monitoring Suppl (ACCU-CHEK AVIVA PLUS) w/Device KIT Use to test blood glucose once daily 1 kit 0  . Coenzyme Q10 300 MG CAPS Take 1 capsule by mouth daily.     . diphenhydrAMINE (BENADRYL) 2 % cream Apply topically 3 (three) times daily as needed for itching.    [name] glucose blood (ACCU-CHEK AVIVA) test strip Use to test blood glucose once daily 100 each 3  . hydrocortisone (ANUCORT-HC) 25 MG suppository Take as directed 120 suppository 0  . hydrocortisone (PROCTOZONE-HC) 2.5 % rectal cream Place rectally 2 (two) times daily. 60 g 3  . lisinopril (PRINIVIL,ZESTRIL) 10 MG tablet TAKE 1 TABLET BY MOUTH ONCE DAILY 90 tablet 3  . pantoprazole (PROTONIX) 40 MG tablet TAKE 1 TABLET BY MOUTH ONCE DAILY 90 tablet 3  . pravastatin (PRAVACHOL) 40 MG tablet TAKE 1 TABLET BY MOUTH ONCE DAILY 90 tablet 3  . terbinafine  (LAMISIL) 250 MG tablet Take 1 tablet (250 mg total) by mouth daily. 30 tablet 0  . triamcinolone (NASACORT) 55 MCG/ACT AERO nasal inhaler Place 2 sprays into the nose daily. 1 Inhaler 12   No current facility-administered medications on file prior to visit.     BP 122/74   Temp 98.9 F (37.2 C)   Wt 177 lb (80.3 kg)   BMI 25.76 kg/m       Objective:  Physical Exam Vitals signs and nursing note reviewed.  Constitutional:      Appearance: Normal appearance.  HENT:     Right Ear: Tympanic membrane, ear canal and external ear normal. There is no impacted cerumen.     Left Ear: Tympanic membrane, ear canal and external ear normal. There is no impacted cerumen.     Nose: Nose normal. No congestion or rhinorrhea.     Mouth/Throat:     Mouth: Mucous membranes are moist.     Pharynx: Oropharynx is clear.  Cardiovascular:     Rate and Rhythm: Normal rate and regular rhythm.     Pulses: Normal pulses.     Heart sounds: Normal heart sounds.  Pulmonary:     Effort: Pulmonary effort is normal.     Breath sounds: Normal breath sounds.  Abdominal:     General: Abdomen is flat.     Palpations: Abdomen is soft.  Neurological:     General: No focal deficit present.     Mental Status: He is oriented to person, place, and time. Mental status is at baseline.  Psychiatric:        Mood and Affect: Mood normal.        Behavior: Behavior normal.        Thought Content: Thought content normal.        Judgment: Judgment normal.       Assessment & Plan:  1. Flu-like symptoms -New swab negative.  Since his symptoms are improving we will hold off on treatment at this time.  He will send me a my chart message tomorrow to let me know if he continues to improve.  Will send in albuterol inhaler that he can use if shortness of breath returns.  Return precautions reviewed.  Okay to continue Tylenol every 6 hours as needed for symptom relief, stay hydrated and rest - POC Influenza  A&B(BINAX/QUICKVUE) - albuterol (PROVENTIL HFA;VENTOLIN HFA) 108 (90 Base) MCG/ACT inhaler; Inhale 2 puffs into the lungs every 6 (six) hours as needed for wheezing or shortness of breath.  Dispense: 1 Inhaler; Refill: 2  [name], NP

## 2018-02-07 ENCOUNTER — Encounter: Payer: Self-pay | Admitting: Adult Health

## 2018-02-08 ENCOUNTER — Encounter: Payer: Self-pay | Admitting: Adult Health

## 2018-02-08 ENCOUNTER — Ambulatory Visit (INDEPENDENT_AMBULATORY_CARE_PROVIDER_SITE_OTHER): Payer: Medicare HMO | Admitting: Adult Health

## 2018-02-08 VITALS — BP 120/82 | Temp 97.7°F | Wt 175.0 lb

## 2018-02-08 DIAGNOSIS — R05 Cough: Secondary | ICD-10-CM

## 2018-02-08 DIAGNOSIS — R058 Other specified cough: Secondary | ICD-10-CM

## 2018-02-08 DIAGNOSIS — M79645 Pain in left finger(s): Secondary | ICD-10-CM | POA: Diagnosis not present

## 2018-02-08 NOTE — Progress Notes (Signed)
Subjective:    Patient ID: Alexander [name], male    DOB: [birth year], 78 y.o.   MRN: [id number]  HPI  78 year old male who  has a past medical history of Arthritis, CAD (coronary artery disease), Diverticulosis of colon, GERD (gastroesophageal reflux disease), GI bleed, Gout, Hematuria, microscopic, Hemorrhoids, Hyperglycemia, Hyperlipidemia, Hypertension, Lumbar back pain, Microalbuminuria, Overweight(278.02), Polycythemia rubra vera ([place]), and Tubular adenoma of colon (07/2012).  Presents to the office today for complaint of left index finger pain.  Pain was worse yesterday and he noticed to red bump towards the distal end of his finger.  Reports that the swelling has since subsided and so has the pain.  Note he was seen earlier in the week with flulike symptoms but his flu test was negative.  Today in the office he reports that he is feeling much better but continues to have a cough.  Review of Systems See HPI   Past Medical History:  Diagnosis Date  . Arthritis    "minor; shoulders primarily" (05/20/2013)  . CAD (coronary artery disease)    a. 05/2013 - Chest pain/unstable angina and dynamic EKG changes showing cath showing atherosclerotic coronary artery disease manifested as diffuse ectasia, normal EF.  . Diverticulosis of colon   . GERD (gastroesophageal reflux disease)   . GI bleed    secondary to Aspirin  . Gout   . Hematuria, microscopic   . Hemorrhoids   . Hyperglycemia   . Hyperlipidemia   . Hypertension   . Lumbar back pain   . Microalbuminuria   . Overweight(278.02)   . Polycythemia rubra vera ([place])   . Tubular adenoma of colon 07/2012    Social History   Socioeconomic History  . Marital status: Married    Spouse name: Not on file  . Number of children: 3  . Years of education: Not on file  . Highest education level: Not on file  Occupational History  . Occupation: retired Lobbyist: RETIRED  Social Needs  . Financial resource strain: Not on  file  . Food insecurity:    Worry: Not on file    Inability: Not on file  . Transportation needs:    Medical: Not on file    Non-medical: Not on file  Tobacco Use  . Smoking status: Never Smoker  . Smokeless tobacco: Never Used  . Tobacco comment: NEVER USED TOBACCO  Substance and Sexual Activity  . Alcohol use: No    Alcohol/week: 0.0 standard drinks  . Drug use: No  . Sexual activity: Yes    Partners: Female  Lifestyle  . Physical activity:    Days per week: Not on file    Minutes per session: Not on file  . Stress: Not on file  Relationships  . Social connections:    Talks on phone: Not on file    Gets together: Not on file    Attends religious service: Not on file    Active member of club or organization: Not on file    Attends meetings of clubs or organizations: Not on file    Relationship status: Not on file  . Intimate partner violence:    Fear of current or ex partner: Not on file    Emotionally abused: Not on file    Physically abused: Not on file    Forced sexual activity: Not on file  Other Topics Concern  . Not on file  Social History Narrative   Marriedfor 52 years  Daughter, [name] (pediatrician--lives in [place], [place]), one in [place] - Engineer, water. One in [place] - Chief Strategy Officer           Past Surgical History:  Procedure Laterality Date  . CARDIAC CATHETERIZATION  05/20/2013  . CATARACT EXTRACTION W/ INTRAOCULAR LENS  IMPLANT, BILATERAL Bilateral 2008  . CYSTECTOMY  ~ 2000   " SEBACEOUS cyst removed from between shoulder blades"  . EXCISIONAL HEMORRHOIDECTOMY  1970's  . INGUINAL HERNIA REPAIR Right ~ 2010  . LEFT HEART CATHETERIZATION WITH CORONARY ANGIOGRAM N/A 05/20/2013   Procedure: LEFT HEART CATHETERIZATION WITH CORONARY ANGIOGRAM;  Surgeon: Peter M Martinique, MD;  Location: [facility] CATH LAB;  Service: Cardiovascular;  Laterality: N/A;  . TONSILLECTOMY AND ADENOIDECTOMY  1940's  . VASECTOMY      Family History  Problem Relation Age of Onset  . Colon  cancer Mother        died at 65, colorectal  . Diabetes Mother   . Heart disease Father 61       MI  . Diabetes Father   . Stomach cancer Maternal Grandfather   . Cancer Maternal Uncle     Allergies  Allergen Reactions  . Metformin And Related Diarrhea    Current Outpatient Medications on File Prior to Visit  Medication Sig Dispense Refill  . ACCU-CHEK SOFTCLIX LANCETS lancets Use to test blood glucose once daily 100 each 3  . albuterol (PROVENTIL HFA;VENTOLIN HFA) 108 (90 Base) MCG/ACT inhaler Inhale 2 puffs into the lungs every 6 (six) hours as needed for wheezing or shortness of breath. 1 Inhaler 2  . allopurinol (ZYLOPRIM) 100 MG tablet TAKE 1 TABLET BY MOUTH ONCE DAILY 90 tablet 2  . amLODipine (NORVASC) 10 MG tablet TAKE 1 TABLET BY MOUTH ONCE DAILY 90 tablet 2  . aspirin EC 81 MG tablet Take 1 tablet (81 mg total) by mouth daily. 90 tablet 3  . azelastine (ASTELIN) 0.1 % nasal spray Place 1 spray into both nostrils 2 (two) times daily. Use in each nostril as directed 30 mL 3  . Blood Glucose Monitoring Suppl (ACCU-CHEK AVIVA PLUS) w/Device KIT Use to test blood glucose once daily 1 kit 0  . Coenzyme Q10 300 MG CAPS Take 1 capsule by mouth daily.     . diphenhydrAMINE (BENADRYL) 2 % cream Apply topically 3 (three) times daily as needed for itching.    [name] glucose blood (ACCU-CHEK AVIVA) test strip Use to test blood glucose once daily 100 each 3  . hydrocortisone (ANUCORT-HC) 25 MG suppository Take as directed 120 suppository 0  . hydrocortisone (PROCTOZONE-HC) 2.5 % rectal cream Place rectally 2 (two) times daily. 60 g 3  . lisinopril (PRINIVIL,ZESTRIL) 10 MG tablet TAKE 1 TABLET BY MOUTH ONCE DAILY 90 tablet 3  . pantoprazole (PROTONIX) 40 MG tablet TAKE 1 TABLET BY MOUTH ONCE DAILY 90 tablet 3  . pravastatin (PRAVACHOL) 40 MG tablet TAKE 1 TABLET BY MOUTH ONCE DAILY 90 tablet 3  . terbinafine (LAMISIL) 250 MG tablet Take 1 tablet (250 mg total) by mouth daily. 30 tablet 0  .  triamcinolone (NASACORT) 55 MCG/ACT AERO nasal inhaler Place 2 sprays into the nose daily. 1 Inhaler 12   No current facility-administered medications on file prior to visit.     BP 120/82   Temp 97.7 F (36.5 C)   Wt 175 lb (79.4 kg)   BMI 25.47 kg/m       Objective:   Physical Exam Vitals signs and nursing note reviewed.  Constitutional:  Appearance: Normal appearance.  Cardiovascular:     Rate and Rhythm: Normal rate and regular rhythm.     Pulses: Normal pulses.  Pulmonary:     Effort: Pulmonary effort is normal.     Breath sounds: Normal breath sounds.  Musculoskeletal:        General: Tenderness present.     Comments: Does have a small, less than a pencil eraser sized nodule between the PIP & DIP joints distally.  Does not feel as a ganglion cyst or simple cyst.  Does not appear as abscess.  Almost appears as though it is from a small abrasion.  Skin:    General: Skin is warm and dry.     Capillary Refill: Capillary refill takes less than 2 seconds.  Neurological:     General: No focal deficit present.     Mental Status: He is alert and oriented to person, place, and time.       Assessment & Plan:  1. Pain of finger of left hand -Should heal on its own.  Nothing needed at this time - Follow up as needed  2. Post-viral cough syndrome -Take Mucinex or Delsym if needed.  [name], NP

## 2018-02-14 ENCOUNTER — Encounter: Payer: Self-pay | Admitting: Adult Health

## 2018-02-15 ENCOUNTER — Encounter: Payer: Self-pay | Admitting: Adult Health

## 2018-02-15 ENCOUNTER — Ambulatory Visit (INDEPENDENT_AMBULATORY_CARE_PROVIDER_SITE_OTHER): Payer: Medicare HMO | Admitting: Adult Health

## 2018-02-15 VITALS — BP 128/86 | HR 60 | Temp 97.5°F | Wt 179.0 lb

## 2018-02-15 DIAGNOSIS — R05 Cough: Secondary | ICD-10-CM

## 2018-02-15 DIAGNOSIS — R058 Other specified cough: Secondary | ICD-10-CM

## 2018-02-15 NOTE — Progress Notes (Signed)
Subjective:    Patient ID: Alexander [name], male    DOB: 1940-02-13, 78 y.o.   MRN: [id number]  HPI 78 year old male who  has a past medical history of Arthritis, CAD (coronary artery disease), Diverticulosis of colon, GERD (gastroesophageal reflux disease), GI bleed, Gout, Hematuria, microscopic, Hemorrhoids, Hyperglycemia, Hyperlipidemia, Hypertension, Lumbar back pain, Microalbuminuria, Overweight(278.02), Polycythemia rubra vera ([place]), and Tubular adenoma of colon (07/2012).  He presents to the office today for complaint of non productive cough since he was diagnosed with a viral illness two weeks ago. He denies fevers or chills. Feels as though the cough is improving. Cough is not keeping him up at night    Review of Systems See HPI   Past Medical History:  Diagnosis Date  . Arthritis    "minor; shoulders primarily" (05/20/2013)  . CAD (coronary artery disease)    a. 05/2013 - Chest pain/unstable angina and dynamic EKG changes showing cath showing atherosclerotic coronary artery disease manifested as diffuse ectasia, normal EF.  . Diverticulosis of colon   . GERD (gastroesophageal reflux disease)   . GI bleed    secondary to Aspirin  . Gout   . Hematuria, microscopic   . Hemorrhoids   . Hyperglycemia   . Hyperlipidemia   . Hypertension   . Lumbar back pain   . Microalbuminuria   . Overweight(278.02)   . Polycythemia rubra vera ([place])   . Tubular adenoma of colon 07/2012    Social History   Socioeconomic History  . Marital status: Married    Spouse name: Not on file  . Number of children: 3  . Years of education: Not on file  . Highest education level: Not on file  Occupational History  . Occupation: retired Lobbyist: RETIRED  Social Needs  . Financial resource strain: Not on file  . Food insecurity:    Worry: Not on file    Inability: Not on file  . Transportation needs:    Medical: Not on file    Non-medical: Not on file  Tobacco Use  .  Smoking status: Never Smoker  . Smokeless tobacco: Never Used  . Tobacco comment: NEVER USED TOBACCO  Substance and Sexual Activity  . Alcohol use: No    Alcohol/week: 0.0 standard drinks  . Drug use: No  . Sexual activity: Yes    Partners: Female  Lifestyle  . Physical activity:    Days per week: Not on file    Minutes per session: Not on file  . Stress: Not on file  Relationships  . Social connections:    Talks on phone: Not on file    Gets together: Not on file    Attends religious service: Not on file    Active member of club or organization: Not on file    Attends meetings of clubs or organizations: Not on file    Relationship status: Not on file  . Intimate partner violence:    Fear of current or ex partner: Not on file    Emotionally abused: Not on file    Physically abused: Not on file    Forced sexual activity: Not on file  Other Topics Concern  . Not on file  Social History Narrative   Marriedfor 18 years    Daughter, [name] (pediatrician--lives in [place], [place]), one in [place] - Engineer, water. One in [place] - Chief Strategy Officer           Past Surgical History:  Procedure Laterality Date  .  CARDIAC CATHETERIZATION  05/20/2013  . CATARACT EXTRACTION W/ INTRAOCULAR LENS  IMPLANT, BILATERAL Bilateral 2008  . CYSTECTOMY  ~ 2000   " SEBACEOUS cyst removed from between shoulder blades"  . EXCISIONAL HEMORRHOIDECTOMY  1970's  . INGUINAL HERNIA REPAIR Right ~ 2010  . LEFT HEART CATHETERIZATION WITH CORONARY ANGIOGRAM N/A 05/20/2013   Procedure: LEFT HEART CATHETERIZATION WITH CORONARY ANGIOGRAM;  Surgeon: Peter M Martinique, MD;  Location: [facility] CATH LAB;  Service: Cardiovascular;  Laterality: N/A;  . TONSILLECTOMY AND ADENOIDECTOMY  1940's  . VASECTOMY      Family History  Problem Relation Age of Onset  . Colon cancer Mother        died at 66, colorectal  . Diabetes Mother   . Heart disease Father 46       MI  . Diabetes Father   . Stomach cancer Maternal Grandfather   . Cancer  Maternal Uncle     Allergies  Allergen Reactions  . Metformin And Related Diarrhea    Current Outpatient Medications on File Prior to Visit  Medication Sig Dispense Refill  . ACCU-CHEK SOFTCLIX LANCETS lancets Use to test blood glucose once daily 100 each 3  . albuterol (PROVENTIL HFA;VENTOLIN HFA) 108 (90 Base) MCG/ACT inhaler Inhale 2 puffs into the lungs every 6 (six) hours as needed for wheezing or shortness of breath. 1 Inhaler 2  . allopurinol (ZYLOPRIM) 100 MG tablet TAKE 1 TABLET BY MOUTH ONCE DAILY 90 tablet 2  . amLODipine (NORVASC) 10 MG tablet TAKE 1 TABLET BY MOUTH ONCE DAILY 90 tablet 2  . aspirin EC 81 MG tablet Take 1 tablet (81 mg total) by mouth daily. 90 tablet 3  . azelastine (ASTELIN) 0.1 % nasal spray Place 1 spray into both nostrils 2 (two) times daily. Use in each nostril as directed 30 mL 3  . Blood Glucose Monitoring Suppl (ACCU-CHEK AVIVA PLUS) w/Device KIT Use to test blood glucose once daily 1 kit 0  . Coenzyme Q10 300 MG CAPS Take 1 capsule by mouth daily.     . diphenhydrAMINE (BENADRYL) 2 % cream Apply topically 3 (three) times daily as needed for itching.    [name] glucose blood (ACCU-CHEK AVIVA) test strip Use to test blood glucose once daily 100 each 3  . hydrocortisone (ANUCORT-HC) 25 MG suppository Take as directed 120 suppository 0  . hydrocortisone (PROCTOZONE-HC) 2.5 % rectal cream Place rectally 2 (two) times daily. 60 g 3  . lisinopril (PRINIVIL,ZESTRIL) 10 MG tablet TAKE 1 TABLET BY MOUTH ONCE DAILY 90 tablet 3  . pantoprazole (PROTONIX) 40 MG tablet TAKE 1 TABLET BY MOUTH ONCE DAILY 90 tablet 3  . pravastatin (PRAVACHOL) 40 MG tablet TAKE 1 TABLET BY MOUTH ONCE DAILY 90 tablet 3  . terbinafine (LAMISIL) 250 MG tablet Take 1 tablet (250 mg total) by mouth daily. 30 tablet 0  . triamcinolone (NASACORT) 55 MCG/ACT AERO nasal inhaler Place 2 sprays into the nose daily. 1 Inhaler 12   No current facility-administered medications on file prior to visit.       BP 128/86   Pulse 60   Temp (!) 97.5 F (36.4 C)   Wt 179 lb (81.2 kg)   SpO2 98%   BMI 26.05 kg/m       Objective:   Physical Exam Vitals signs and nursing note reviewed.  Constitutional:      Appearance: Normal appearance.  Cardiovascular:     Rate and Rhythm: Normal rate and regular rhythm.  Pulses: Normal pulses.     Heart sounds: Normal heart sounds.  Pulmonary:     Effort: Pulmonary effort is normal. No respiratory distress.     Breath sounds: Normal breath sounds. No wheezing.  Neurological:     Mental Status: He is alert.       Assessment & Plan:  1. Post-viral cough syndrome - Advised cough may last 6 weeks but seems to be improving. Lung clear throughout, there is no concern for pneumonia.  - Can use mucinex or delsym as needed  [name], NP

## 2018-04-03 ENCOUNTER — Encounter: Payer: Self-pay | Admitting: Adult Health

## 2018-04-04 ENCOUNTER — Ambulatory Visit: Payer: Medicare HMO | Admitting: Hematology & Oncology

## 2018-04-04 ENCOUNTER — Other Ambulatory Visit: Payer: Medicare HMO

## 2018-04-22 ENCOUNTER — Encounter: Payer: Self-pay | Admitting: Hematology & Oncology

## 2018-04-26 ENCOUNTER — Encounter: Payer: Self-pay | Admitting: Adult Health

## 2018-04-26 ENCOUNTER — Other Ambulatory Visit: Payer: Self-pay | Admitting: Adult Health

## 2018-04-26 DIAGNOSIS — Z760 Encounter for issue of repeat prescription: Secondary | ICD-10-CM

## 2018-04-26 MED ORDER — TRIAMCINOLONE ACETONIDE 55 MCG/ACT NA AERO: 2.0000 | INHALATION_SPRAY | Freq: Every day | NASAL | 12 refills | Status: DC

## 2018-05-01 DIAGNOSIS — H1013 Acute atopic conjunctivitis, bilateral: Secondary | ICD-10-CM | POA: Diagnosis not present

## 2018-05-09 DIAGNOSIS — H0011 Chalazion right upper eyelid: Secondary | ICD-10-CM | POA: Diagnosis not present

## 2018-05-19 ENCOUNTER — Encounter: Payer: Self-pay | Admitting: Hematology & Oncology

## 2018-05-21 ENCOUNTER — Encounter: Payer: Self-pay | Admitting: Hematology & Oncology

## 2018-05-23 ENCOUNTER — Inpatient Hospital Stay: Payer: Medicare HMO | Attending: Hematology & Oncology

## 2018-05-23 ENCOUNTER — Other Ambulatory Visit: Payer: Self-pay

## 2018-05-23 ENCOUNTER — Telehealth: Payer: Self-pay | Admitting: Family

## 2018-05-23 ENCOUNTER — Inpatient Hospital Stay: Payer: Medicare HMO

## 2018-05-23 ENCOUNTER — Encounter: Payer: Self-pay | Admitting: Family

## 2018-05-23 ENCOUNTER — Inpatient Hospital Stay (HOSPITAL_BASED_OUTPATIENT_CLINIC_OR_DEPARTMENT_OTHER): Payer: Medicare HMO | Admitting: Family

## 2018-05-23 VITALS — BP 128/77 | HR 59

## 2018-05-23 VITALS — BP 137/70 | HR 64 | Resp 19 | Ht 72.0 in | Wt 182.0 lb

## 2018-05-23 DIAGNOSIS — D45 Polycythemia vera: Secondary | ICD-10-CM | POA: Insufficient documentation

## 2018-05-23 DIAGNOSIS — Z7982 Long term (current) use of aspirin: Secondary | ICD-10-CM | POA: Insufficient documentation

## 2018-05-23 LAB — CMP (CANCER CENTER ONLY)
ALT: 15 U/L (ref 0–44)
AST: 15 U/L (ref 15–41)
Albumin: 4.5 g/dL (ref 3.5–5.0)
Alkaline Phosphatase: 81 U/L (ref 38–126)
Anion gap: 7 (ref 5–15)
BUN: 24 mg/dL — ABNORMAL HIGH (ref 8–23)
CO2: 28 mmol/L (ref 22–32)
Calcium: 9.4 mg/dL (ref 8.9–10.3)
Chloride: 104 mmol/L (ref 98–111)
Creatinine: 1.27 mg/dL — ABNORMAL HIGH (ref 0.61–1.24)
GFR, Est AFR Am: >60 mL/min (ref >60)
GFR, Estimated: 54 mL/min — ABNORMAL LOW (ref >60)
Glucose, Bld: 154 mg/dL — ABNORMAL HIGH (ref 70–99)
Potassium: 4.6 mmol/L (ref 3.5–5.1)
Sodium: 139 mmol/L (ref 135–145)
Total Bilirubin: 0.5 mg/dL (ref 0.3–1.2)
Total Protein: 7.2 g/dL (ref 6.5–8.1)

## 2018-05-23 LAB — CBC WITH DIFFERENTIAL (CANCER CENTER ONLY)
Abs Immature Granulocytes: 0.04 10*3/uL (ref 0.00–0.07)
Basophils Absolute: 0.1 10*3/uL (ref 0.0–0.1)
Basophils Relative: 1 %
Eosinophils Absolute: 0.3 10*3/uL (ref 0.0–0.5)
Eosinophils Relative: 3 %
HCT: 48.9 % (ref 39.0–52.0)
Hemoglobin: 16.1 g/dL (ref 13.0–17.0)
Immature Granulocytes: 0 %
Lymphocytes Relative: 20 %
Lymphs Abs: 2 10*3/uL (ref 0.7–4.0)
MCH: 31 pg (ref 26.0–34.0)
MCHC: 32.9 g/dL (ref 30.0–36.0)
MCV: 94.2 fL (ref 80.0–100.0)
Monocytes Absolute: 1.2 10*3/uL — ABNORMAL HIGH (ref 0.1–1.0)
Monocytes Relative: 12 %
Neutro Abs: 6.4 10*3/uL (ref 1.7–7.7)
Neutrophils Relative %: 64 %
Platelet Count: 231 10*3/uL (ref 150–400)
RBC: 5.19 MIL/uL (ref 4.22–5.81)
RDW: 14.7 % (ref 11.5–15.5)
WBC Count: 9.9 10*3/uL (ref 4.0–10.5)
nRBC: 0 % (ref 0.0–0.2)

## 2018-05-23 NOTE — Patient Instructions (Signed)

## 2018-05-23 NOTE — Progress Notes (Signed)
Alexander [name] presents today for phlebotomy per MD orders. Phlebotomy procedure started at 13:00 and ended at 13:08. 521 cc removed via 16 G needle at L antecubital site. Patient tolerated procedure well.

## 2018-05-23 NOTE — Telephone Encounter (Signed)
Appointments scheduled calendar printed per 5/21 los 

## 2018-05-23 NOTE — Progress Notes (Signed)
Hematology and Oncology Follow Up Visit  Alexander [name] [id number] 03-Sep-1940 78 y.o. 05/23/2018   Principle Diagnosis:  Polycythemia vera-JAK2 negative  Current Therapy:   Phlebotomy to maintain hematocrit below 45% Aspirin 81 mg by mouth daily   Interim History:  Alexander [name] is here today for follow-up. He is doing well and has no complaints at this time.  He is staying busy cooking and taking care of his home. He walks quite q bit for exercise. His Hct today is 48.9%.  He is taking his baby aspirin daily as directed.  He bruises easily but has had no issues with bleeding.  No fever, chills, n/v, cough, rash, dizziness, SOB, chest pain, palpitations, abdominal pain or changes in bowel or bladder habits.  No swelling, tenderness, numbness or tingling in his extremities.  No lymphadenopathy noted on exam.  He is eating well and staying hydrated. His weight is stable.   ECOG Performance Status: 0 - Asymptomatic  Medications:  Allergies as of 05/23/2018      Reactions   Metformin And Related Diarrhea      Medication List       Accurate as of May 23, 2018 12:35 PM. If you have any questions, ask your nurse or doctor.        Accu-Chek Aviva Plus w/Device Kit Use to test blood glucose once daily   Accu-Chek Softclix Lancets lancets Use to test blood glucose once daily   albuterol 108 (90 Base) MCG/ACT inhaler Commonly known as:  VENTOLIN HFA Inhale 2 puffs into the lungs every 6 (six) hours as needed for wheezing or shortness of breath.   allopurinol 100 MG tablet Commonly known as:  ZYLOPRIM TAKE 1 TABLET BY MOUTH ONCE DAILY   amLODipine 10 MG tablet Commonly known as:  NORVASC TAKE 1 TABLET BY MOUTH ONCE DAILY   aspirin EC 81 MG tablet Take 1 tablet (81 mg total) by mouth daily.   azelastine 0.1 % nasal spray Commonly known as:  Astelin Place 1 spray into both nostrils 2 (two) times daily. Use in each nostril as directed   Coenzyme Q10 300 MG Caps  Take 1 capsule by mouth daily.   diphenhydrAMINE 2 % cream Commonly known as:  BENADRYL Apply topically 3 (three) times daily as needed for itching.   glucose blood test strip Commonly known as:  Accu-Chek Aviva Use to test blood glucose once daily   hydrocortisone 2.5 % rectal cream Commonly known as:  Proctozone-HC Place rectally 2 (two) times daily.   Proctozone-HC 2.5 % rectal cream Generic drug:  hydrocortisone APPLY 1 CREAM RECTALLY TWICE DAILY   hydrocortisone 25 MG suppository Commonly known as:  Anucort-HC Take as directed   lisinopril 10 MG tablet Commonly known as:  ZESTRIL TAKE 1 TABLET BY MOUTH ONCE DAILY   neomycin-polymyxin b-dexamethasone 3.5-10000-0.1 Susp Commonly known as:  MAXITROL INSTILL 1 DROP INTO AFFECTED EYE(S) 4 TIMES DAILY   pantoprazole 40 MG tablet Commonly known as:  PROTONIX TAKE 1 TABLET BY MOUTH ONCE DAILY   pravastatin 40 MG tablet Commonly known as:  PRAVACHOL TAKE 1 TABLET BY MOUTH ONCE DAILY   prednisoLONE acetate 1 % ophthalmic suspension Commonly known as:  PRED FORTE INSTILL 1 DROP INTO AFFECTED EYE(S) 4 TIMES DAILY   PreviDent 5000 Enamel Protect 1.1-5 % Pste Generic drug:  Sod Fluoride-Potassium Nitrate See admin instructions.   terbinafine 250 MG tablet Commonly known as:  LamISIL Take 1 tablet (250 mg total) by mouth daily.   triamcinolone  55 MCG/ACT Aero nasal inhaler Commonly known as:  NASACORT Place 2 sprays into the nose daily.       Allergies:  Allergies  Allergen Reactions  . Metformin And Related Diarrhea    Past Medical History, Surgical history, Social history, and Family History were reviewed and updated.  Review of Systems: All other 10 point review of systems is negative.   Physical Exam:  height is 6' (1.829 m) and weight is 182 lb (82.6 kg). His pulse is 64. His oxygen saturation is 100%.   Wt Readings from Last 3 Encounters:  05/23/18 182 lb (82.6 kg)  02/15/18 179 lb (81.2 kg)   02/08/18 175 lb (79.4 kg)    Ocular: Sclerae unicteric, pupils equal, round and reactive to light Ear-nose-throat: Oropharynx clear, dentition fair Lymphatic: No cervical or supraclavicular adenopathy Lungs no rales or rhonchi, good excursion bilaterally Heart regular rate and rhythm, no murmur appreciated Abd soft, nontender, positive bowel sounds, no liver or spleen tip palpated on exam, no fluid wave  MSK no focal spinal tenderness, no joint edema Neuro: non-focal, well-oriented, appropriate affect Breasts: Deferred   Lab Results  Component Value Date   WBC 9.9 05/23/2018   HGB 16.1 05/23/2018   HCT 48.9 05/23/2018   MCV 94.2 05/23/2018   PLT 231 05/23/2018   Lab Results  Component Value Date   FERRITIN 28 02/01/2018   IRON 86 02/01/2018   TIBC 335 02/01/2018   UIBC 249 02/01/2018   IRONPCTSAT 26 02/01/2018   Lab Results  Component Value Date   RETICCTPCT 0.6 12/19/2013   RBC 5.19 05/23/2018   RETICCTABS 32.6 12/19/2013   No results found for: KPAFRELGTCHN, LAMBDASER, KAPLAMBRATIO No results found for: IGGSERUM, IGA, IGMSERUM No results found for: Odetta Pink, SPEI   Chemistry      Component Value Date/Time   NA 139 05/23/2018 1135   NA 144 11/29/2016 1054   NA 139 12/29/2015 0954   K 4.6 05/23/2018 1135   K 4.2 11/29/2016 1054   K 4.1 12/29/2015 0954   CL 104 05/23/2018 1135   CL 102 11/29/2016 1054   CO2 28 05/23/2018 1135   CO2 30 11/29/2016 1054   CO2 26 12/29/2015 0954   BUN 24 (H) 05/23/2018 1135   BUN 22 11/29/2016 1054   BUN 20.0 12/29/2015 0954   CREATININE 1.27 (H) 05/23/2018 1135   CREATININE 1.2 11/29/2016 1054   CREATININE 1.3 12/29/2015 0954      Component Value Date/Time   CALCIUM 9.4 05/23/2018 1135   CALCIUM 9.3 11/29/2016 1054   CALCIUM 9.5 12/29/2015 0954   ALKPHOS 81 05/23/2018 1135   ALKPHOS 92 (H) 11/29/2016 1054   ALKPHOS 98 12/29/2015 0954   AST 15 05/23/2018 1135    AST 16 12/29/2015 0954   ALT 15 05/23/2018 1135   ALT 20 11/29/2016 1054   ALT 13 12/29/2015 0954   BILITOT 0.5 05/23/2018 1135   BILITOT 0.71 12/29/2015 0954       Impression and Plan: Alexander [name] is a very pleasant 78 yo caucasian gentleman with polycythemia, JAK-2 negative.  Hct is 48.9% so we will proceed with phlebotomy today as planned.  We will plan to see him back in another 3 months for follow-up.  He will contact our office with any questions or concerns. We can certainly see him sooner if need be.   Laverna Peace, NP 5/21/202012:35 PM

## 2018-05-24 LAB — FERRITIN: Ferritin: 25 ng/mL (ref 24–336)

## 2018-05-24 LAB — IRON AND TIBC
Iron: 73 ug/dL (ref 42–163)
Saturation Ratios: 20 % (ref 20–55)
TIBC: 359 ug/dL (ref 202–409)
UIBC: 286 ug/dL (ref 117–376)

## 2018-05-30 ENCOUNTER — Encounter: Payer: Self-pay | Admitting: Adult Health

## 2018-05-30 ENCOUNTER — Other Ambulatory Visit: Payer: Self-pay

## 2018-05-30 ENCOUNTER — Other Ambulatory Visit: Payer: Self-pay | Admitting: Adult Health

## 2018-05-30 ENCOUNTER — Ambulatory Visit (INDEPENDENT_AMBULATORY_CARE_PROVIDER_SITE_OTHER): Payer: Medicare HMO | Admitting: Adult Health

## 2018-05-30 DIAGNOSIS — N410 Acute prostatitis: Secondary | ICD-10-CM | POA: Diagnosis not present

## 2018-05-30 MED ORDER — CIPROFLOXACIN HCL 500 MG PO TABS: 500.0000 mg | ORAL_TABLET | Freq: Two times a day (BID) | ORAL | 0 refills | Status: AC

## 2018-05-30 NOTE — Progress Notes (Signed)
Virtual Visit via Video Note  I connected with Alexander [name] on 05/30/18 at  4:00 PM EDT by a video enabled telemedicine application and verified that I am speaking with the correct person using two identifiers.  Location patient: home Location provider:work or home office Persons participating in the virtual visit: patient, provider  I discussed the limitations of evaluation and management by telemedicine and the availability of in person appointments. The patient expressed understanding and agreed to proceed.   HPI: 78 year old male who is being evaluated today for an acute issue of decreased urinary stream.  Orts that he first noticed this approximately 3 to 4 days ago.  Associated symptoms include very mild dysuria as well as some rectal discomfort or "feels like my hemorrhoids when I come out".  He denies any issues with defecation.  Has not had any fevers or chills, and does not feel acutely ill.  Treated in July for suspected prostatitis and feels as though his symptoms are very similar.   ROS: See pertinent positives and negatives per HPI.  Past Medical History:  Diagnosis Date  . Arthritis    "minor; shoulders primarily" (05/20/2013)  . CAD (coronary artery disease)    a. 05/2013 - Chest pain/unstable angina and dynamic EKG changes showing cath showing atherosclerotic coronary artery disease manifested as diffuse ectasia, normal EF.  . Diverticulosis of colon   . GERD (gastroesophageal reflux disease)   . GI bleed    secondary to Aspirin  . Gout   . Hematuria, microscopic   . Hemorrhoids   . Hyperglycemia   . Hyperlipidemia   . Hypertension   . Lumbar back pain   . Microalbuminuria   . Overweight(278.02)   . Polycythemia rubra vera ([place])   . Tubular adenoma of colon 07/2012    Past Surgical History:  Procedure Laterality Date  . CARDIAC CATHETERIZATION  05/20/2013  . CATARACT EXTRACTION W/ INTRAOCULAR LENS  IMPLANT, BILATERAL Bilateral 2008  . CYSTECTOMY  ~ 2000    " SEBACEOUS cyst removed from between shoulder blades"  . EXCISIONAL HEMORRHOIDECTOMY  1970's  . INGUINAL HERNIA REPAIR Right ~ 2010  . LEFT HEART CATHETERIZATION WITH CORONARY ANGIOGRAM N/A 05/20/2013   Procedure: LEFT HEART CATHETERIZATION WITH CORONARY ANGIOGRAM;  Surgeon: Peter M Martinique, MD;  Location: [facility] CATH LAB;  Service: Cardiovascular;  Laterality: N/A;  . TONSILLECTOMY AND ADENOIDECTOMY  1940's  . VASECTOMY      Family History  Problem Relation Age of Onset  . Colon cancer Mother        died at 32, colorectal  . Diabetes Mother   . Heart disease Father 89       MI  . Diabetes Father   . Stomach cancer Maternal Grandfather   . Cancer Maternal Uncle      Current Outpatient Medications:  .  ACCU-CHEK SOFTCLIX LANCETS lancets, Use to test blood glucose once daily, Disp: 100 each, Rfl: 3 .  albuterol (PROVENTIL HFA;VENTOLIN HFA) 108 (90 Base) MCG/ACT inhaler, Inhale 2 puffs into the lungs every 6 (six) hours as needed for wheezing or shortness of breath., Disp: 1 Inhaler, Rfl: 2 .  allopurinol (ZYLOPRIM) 100 MG tablet, TAKE 1 TABLET BY MOUTH ONCE DAILY, Disp: 90 tablet, Rfl: 2 .  amLODipine (NORVASC) 10 MG tablet, TAKE 1 TABLET BY MOUTH ONCE DAILY, Disp: 90 tablet, Rfl: 2 .  aspirin EC 81 MG tablet, Take 1 tablet (81 mg total) by mouth daily., Disp: 90 tablet, Rfl: 3 .  azelastine (ASTELIN) 0.1 %  nasal spray, Place 1 spray into both nostrils 2 (two) times daily. Use in each nostril as directed, Disp: 30 mL, Rfl: 3 .  Blood Glucose Monitoring Suppl (ACCU-CHEK AVIVA PLUS) w/Device KIT, Use to test blood glucose once daily, Disp: 1 kit, Rfl: 0 .  ciprofloxacin (CIPRO) 500 MG tablet, Take 1 tablet (500 mg total) by mouth 2 (two) times daily for 14 days., Disp: 28 tablet, Rfl: 0 .  Coenzyme Q10 300 MG CAPS, Take 1 capsule by mouth daily. , Disp: , Rfl:  .  diphenhydrAMINE (BENADRYL) 2 % cream, Apply topically 3 (three) times daily as needed for itching., Disp: , Rfl:  .  glucose  blood (ACCU-CHEK AVIVA) test strip, Use to test blood glucose once daily, Disp: 100 each, Rfl: 3 .  hydrocortisone (ANUCORT-HC) 25 MG suppository, Take as directed, Disp: 120 suppository, Rfl: 0 .  hydrocortisone (PROCTOZONE-HC) 2.5 % rectal cream, Place rectally 2 (two) times daily., Disp: 60 g, Rfl: 3 .  lisinopril (PRINIVIL,ZESTRIL) 10 MG tablet, TAKE 1 TABLET BY MOUTH ONCE DAILY, Disp: 90 tablet, Rfl: 3 .  neomycin-polymyxin b-dexamethasone (MAXITROL) 3.5-10000-0.1 SUSP, INSTILL 1 DROP INTO AFFECTED EYE(S) 4 TIMES DAILY, Disp: , Rfl:  .  pantoprazole (PROTONIX) 40 MG tablet, TAKE 1 TABLET BY MOUTH ONCE DAILY, Disp: 90 tablet, Rfl: 3 .  pravastatin (PRAVACHOL) 40 MG tablet, TAKE 1 TABLET BY MOUTH ONCE DAILY, Disp: 90 tablet, Rfl: 3 .  prednisoLONE acetate (PRED FORTE) 1 % ophthalmic suspension, INSTILL 1 DROP INTO AFFECTED EYE(S) 4 TIMES DAILY, Disp: , Rfl:  .  PREVIDENT 5000 ENAMEL PROTECT 1.1-5 % PSTE, See admin instructions., Disp: , Rfl:  .  PROCTOZONE-HC 2.5 % rectal cream, APPLY 1 CREAM RECTALLY TWICE DAILY, Disp: , Rfl:  .  terbinafine (LAMISIL) 250 MG tablet, Take 1 tablet (250 mg total) by mouth daily., Disp: 30 tablet, Rfl: 0 .  triamcinolone (NASACORT) 55 MCG/ACT AERO nasal inhaler, Place 2 sprays into the nose daily., Disp: 1 Inhaler, Rfl: 12  EXAM:  VITALS per patient if applicable:  GENERAL: alert, oriented, appears well and in no acute distress  HEENT: atraumatic, conjunttiva clear, no obvious abnormalities on inspection of external nose and ears  NECK: normal movements of the head and neck  LUNGS: on inspection no signs of respiratory distress, breathing rate appears normal, no obvious gross SOB, gasping or wheezing  CV: no obvious cyanosis  MS: moves all visible extremities without noticeable abnormality  PSYCH/NEURO: pleasant and cooperative, no obvious depression or anxiety, speech and thought processing grossly intact  ASSESSMENT AND PLAN:  Discussed the  following assessment and plan:  Acute prostatitis - Plan: ciprofloxacin (CIPRO) 500 MG tablet  -Treat for suspected ending of acute prostatitis with Cipro 500 mg twice daily x14 days.  Will cover for possible UTI as well.  He was advised to follow-up if symptoms have not started resolving in the next week   I discussed the assessment and treatment plan with the patient. The patient was provided an opportunity to ask questions and all were answered. The patient agreed with the plan and demonstrated an understanding of the instructions.   The patient was advised to call back or seek an in-person evaluation if the symptoms worsen or if the condition fails to improve as anticipated.   [name], NP

## 2018-06-19 ENCOUNTER — Other Ambulatory Visit: Payer: Self-pay | Admitting: Adult Health

## 2018-06-20 NOTE — Telephone Encounter (Signed)
Refill requests are too early.

## 2018-07-24 DIAGNOSIS — E118 Type 2 diabetes mellitus with unspecified complications: Secondary | ICD-10-CM | POA: Diagnosis not present

## 2018-07-26 ENCOUNTER — Ambulatory Visit: Payer: Self-pay | Admitting: *Deleted

## 2018-07-26 DIAGNOSIS — S50812A Abrasion of left forearm, initial encounter: Secondary | ICD-10-CM | POA: Diagnosis not present

## 2018-07-26 NOTE — Telephone Encounter (Signed)
Noted.  Pt instructed to see Urgent Care.

## 2018-07-26 NOTE — Telephone Encounter (Signed)
Pt called with having a cut on his left arm from a branch when he was trying to remove some bushes, 2 days ago. And feels like the area is getting worst. It is white in the center and red and purple around it. The site is about a quarter size.  He denies a fever or pain. The area just really itches. He has used witch hazel and bacitracin to the area. He is advised that he needs to be seen either in an office or urgent care., [facility] notified regarding appointment in the office, none available today. Advised pt to go to an urgent care. He voiced understanding. Routing to [facility] at [place] for review.  Reason for Disposition . [1] Red area or streak AND [2] no fever  Answer Assessment - Initial Assessment Questions 1. MECHANISM: "How did the injury happen?"     Tues or Wednesday 2. ONSET: "When did the injury happen?" (Minutes or hours ago)      *No Answer* 3. LOCATION: "Where is the injury located?"      *No Answer* 4. APPEARANCE of INJURY: "What does the injury look like?"     Quarter of an inch and is puffy and white and red and purple all around 5. SEVERITY: "Can you use the arm normally?"      *No Answer* 6. SWELLING or BRUISING: "is there any swelling or bruising?" If so, ask: "How large is it? (e.g., inches, centimeters)      *No Answer* 7. PAIN: "Is there pain?" If so, ask: "How bad is the pain?"    (Scale 1-10; or mild, moderate, severe)     No pain 8. TETANUS: For any breaks in the skin, ask: "When was the last tetanus booster?"     n/a 9. OTHER SYMPTOMS: "Do you have any other symptoms?"  (e.g., numbness in hand)     itching 10. PREGNANCY: "Is there any chance you are pregnant?" "When was your last menstrual period?"       *No Answer*  Answer Assessment - Initial Assessment Questions 1. LOCATION: "Where is the wound located?"      Left mid forearm 2. WOUND APPEARANCE: "What does the wound look like?"      White area in the middle and red and purple  around it  3. SIZE: If redness is present, ask: "What is the size of the red area?" (Inches, centimeters, or compare to size of a coin)      yes 4. SPREAD: "What's changed in the last day?"  "Do you see any red streaks coming from the wound?"     Not improving 5. ONSET: "When did it start to look infected?"      today 6. MECHANISM: "How did the wound start, what was the cause?"     A injury from a branch 7. PAIN: "Is there any pain?" If so, ask: "How bad is the pain?"   (Scale 1-10; or mild, moderate, severe)     no 8. FEVER: "Do you have a fever?" If so, ask: "What is your temperature, how was it measured, and when did it start?"     no 9. OTHER SYMPTOMS: "Do you have any other symptoms?" (e.g., shaking chills, weakness, rash elsewhere on body)     itching 10. PREGNANCY: "Is there any chance you are pregnant?" "When was your last menstrual period?"       n/a  Protocols used: [place], ARM INJURY-A-AH

## 2018-08-06 DIAGNOSIS — E785 Hyperlipidemia, unspecified: Secondary | ICD-10-CM | POA: Diagnosis not present

## 2018-08-06 DIAGNOSIS — Z7982 Long term (current) use of aspirin: Secondary | ICD-10-CM | POA: Diagnosis not present

## 2018-08-06 DIAGNOSIS — J309 Allergic rhinitis, unspecified: Secondary | ICD-10-CM | POA: Diagnosis not present

## 2018-08-06 DIAGNOSIS — D45 Polycythemia vera: Secondary | ICD-10-CM | POA: Diagnosis not present

## 2018-08-06 DIAGNOSIS — Z8249 Family history of ischemic heart disease and other diseases of the circulatory system: Secondary | ICD-10-CM | POA: Diagnosis not present

## 2018-08-06 DIAGNOSIS — I10 Essential (primary) hypertension: Secondary | ICD-10-CM | POA: Diagnosis not present

## 2018-08-06 DIAGNOSIS — M1A9XX0 Chronic gout, unspecified, without tophus (tophi): Secondary | ICD-10-CM | POA: Diagnosis not present

## 2018-08-06 DIAGNOSIS — K219 Gastro-esophageal reflux disease without esophagitis: Secondary | ICD-10-CM | POA: Diagnosis not present

## 2018-08-06 DIAGNOSIS — Z833 Family history of diabetes mellitus: Secondary | ICD-10-CM | POA: Diagnosis not present

## 2018-08-06 DIAGNOSIS — R7303 Prediabetes: Secondary | ICD-10-CM | POA: Diagnosis not present

## 2018-08-08 ENCOUNTER — Other Ambulatory Visit: Payer: Self-pay | Admitting: Adult Health

## 2018-08-09 ENCOUNTER — Encounter: Payer: Self-pay | Admitting: Family Medicine

## 2018-08-09 ENCOUNTER — Encounter: Payer: Self-pay | Admitting: Adult Health

## 2018-08-19 ENCOUNTER — Encounter: Payer: Self-pay | Admitting: Hematology & Oncology

## 2018-08-26 ENCOUNTER — Inpatient Hospital Stay (HOSPITAL_BASED_OUTPATIENT_CLINIC_OR_DEPARTMENT_OTHER): Payer: Medicare HMO | Admitting: Hematology & Oncology

## 2018-08-26 ENCOUNTER — Other Ambulatory Visit: Payer: Self-pay

## 2018-08-26 ENCOUNTER — Encounter: Payer: Self-pay | Admitting: Hematology & Oncology

## 2018-08-26 ENCOUNTER — Inpatient Hospital Stay: Payer: Medicare HMO

## 2018-08-26 ENCOUNTER — Inpatient Hospital Stay: Payer: Medicare HMO | Attending: Hematology & Oncology

## 2018-08-26 VITALS — BP 127/77 | HR 60 | Temp 97.1°F | Resp 20 | Wt 180.1 lb

## 2018-08-26 VITALS — BP 136/76 | HR 57 | Resp 18

## 2018-08-26 DIAGNOSIS — D45 Polycythemia vera: Secondary | ICD-10-CM | POA: Insufficient documentation

## 2018-08-26 LAB — CMP (CANCER CENTER ONLY)
ALT: 14 U/L (ref 0–44)
AST: 14 U/L — ABNORMAL LOW (ref 15–41)
Albumin: 4.1 g/dL (ref 3.5–5.0)
Alkaline Phosphatase: 72 U/L (ref 38–126)
Anion gap: 7 (ref 5–15)
BUN: 25 mg/dL — ABNORMAL HIGH (ref 8–23)
CO2: 29 mmol/L (ref 22–32)
Calcium: 9.3 mg/dL (ref 8.9–10.3)
Chloride: 105 mmol/L (ref 98–111)
Creatinine: 1.35 mg/dL — ABNORMAL HIGH (ref 0.61–1.24)
GFR, Est AFR Am: 58 mL/min — ABNORMAL LOW (ref >60)
GFR, Estimated: 50 mL/min — ABNORMAL LOW (ref >60)
Glucose, Bld: 113 mg/dL — ABNORMAL HIGH (ref 70–99)
Potassium: 4.5 mmol/L (ref 3.5–5.1)
Sodium: 141 mmol/L (ref 135–145)
Total Bilirubin: 0.5 mg/dL (ref 0.3–1.2)
Total Protein: 7.3 g/dL (ref 6.5–8.1)

## 2018-08-26 LAB — CBC WITH DIFFERENTIAL (CANCER CENTER ONLY)
Abs Immature Granulocytes: 0.05 10*3/uL (ref 0.00–0.07)
Basophils Absolute: 0.1 10*3/uL (ref 0.0–0.1)
Basophils Relative: 1 %
Eosinophils Absolute: 0.9 10*3/uL — ABNORMAL HIGH (ref 0.0–0.5)
Eosinophils Relative: 8 %
HCT: 47.2 % (ref 39.0–52.0)
Hemoglobin: 15.2 g/dL (ref 13.0–17.0)
Immature Granulocytes: 0 %
Lymphocytes Relative: 21 %
Lymphs Abs: 2.4 10*3/uL (ref 0.7–4.0)
MCH: 30.2 pg (ref 26.0–34.0)
MCHC: 32.2 g/dL (ref 30.0–36.0)
MCV: 93.8 fL (ref 80.0–100.0)
Monocytes Absolute: 1.3 10*3/uL — ABNORMAL HIGH (ref 0.1–1.0)
Monocytes Relative: 11 %
Neutro Abs: 6.7 10*3/uL (ref 1.7–7.7)
Neutrophils Relative %: 59 %
Platelet Count: 226 10*3/uL (ref 150–400)
RBC: 5.03 MIL/uL (ref 4.22–5.81)
RDW: 15 % (ref 11.5–15.5)
WBC Count: 11.4 10*3/uL — ABNORMAL HIGH (ref 4.0–10.5)
nRBC: 0 % (ref 0.0–0.2)

## 2018-08-26 NOTE — Progress Notes (Signed)
Alexander [name] presents today for phlebotomy per MD orders. Phlebotomy procedure started at 1330 and ended at 1335. 550 cc removed via 16 G needle at L antecubital site. Patient tolerated procedure well. Phlebotomy performed by Randolm Idol, RN.

## 2018-08-26 NOTE — Patient Instructions (Signed)

## 2018-08-26 NOTE — Progress Notes (Signed)
Hematology and Oncology Follow Up Visit  Alexander [name] [id number] 1940/04/26 78 y.o. 08/26/2018   Principle Diagnosis:  Polycythemia vera-JAK2 negative  Current Therapy:    Phlebotomy to maintain hematocrit below 45% - 12/29/2015  Aspirin 81 mg by mouth daily     Interim History:  Alexander [name] is in for a followup.  He is doing quite well.  Because of the corona virus, he is now swimming a lot.  He says he swims maybe 2 miles a day.  I think he swam over 3 miles today.  He and his wife really are not going to know where.  His wife has quite a few health issues so she could not get the coronavirus.  He has had no issues with respect to headache.  There is no cough or shortness of breath.  He has had no nausea or vomiting.  He is past due on his colonoscopy.  Hopefully he will be able to have it this fall.  Thankfully, his family is doing well.  Apparently, a grandchild down in [place] got the coronavirus but he was doing okay.  Overall, his performance status is [name].  Medications:  Current Outpatient Medications:  .  ACCU-CHEK SOFTCLIX LANCETS lancets, Use to test blood glucose once daily, Disp: 100 each, Rfl: 3 .  allopurinol (ZYLOPRIM) 100 MG tablet, Take 1 tablet by mouth once daily, Disp: 90 tablet, Rfl: 0 .  amLODipine (NORVASC) 10 MG tablet, Take 1 tablet by mouth once daily, Disp: 90 tablet, Rfl: 0 .  aspirin EC 81 MG tablet, Take 1 tablet (81 mg total) by mouth daily., Disp: 90 tablet, Rfl: 3 .  Blood Glucose Monitoring Suppl (ACCU-CHEK AVIVA PLUS) w/Device KIT, Use to test blood glucose once daily, Disp: 1 kit, Rfl: 0 .  Coenzyme Q10 300 MG CAPS, Take 1 capsule by mouth daily. , Disp: , Rfl:  .  glucose blood (ACCU-CHEK AVIVA) test strip, Use to test blood glucose once daily, Disp: 100 each, Rfl: 3 .  lisinopril (ZESTRIL) 10 MG tablet, Take 1 tablet by mouth once daily, Disp: 90 tablet, Rfl: 0 .  pantoprazole (PROTONIX) 40 MG tablet, TAKE 1 TABLET BY MOUTH ONCE  DAILY, Disp: 90 tablet, Rfl: 3 .  pravastatin (PRAVACHOL) 40 MG tablet, Take 1 tablet by mouth once daily, Disp: 90 tablet, Rfl: 0 .  PREVIDENT 5000 ENAMEL PROTECT 1.1-5 % PSTE, See admin instructions., Disp: , Rfl:  .  albuterol (PROVENTIL HFA;VENTOLIN HFA) 108 (90 Base) MCG/ACT inhaler, Inhale 2 puffs into the lungs every 6 (six) hours as needed for wheezing or shortness of breath. (Patient not taking: Reported on 08/26/2018), Disp: 1 Inhaler, Rfl: 2 .  azelastine (ASTELIN) 0.1 % nasal spray, Place 1 spray into both nostrils 2 (two) times daily. Use in each nostril as directed (Patient not taking: Reported on 08/26/2018), Disp: 30 mL, Rfl: 3 .  diphenhydrAMINE (BENADRYL) 2 % cream, Apply topically 3 (three) times daily as needed for itching., Disp: , Rfl:  .  hydrocortisone (ANUCORT-HC) 25 MG suppository, Take as directed (Patient not taking: Reported on 08/26/2018), Disp: 120 suppository, Rfl: 0 .  hydrocortisone (PROCTOZONE-HC) 2.5 % rectal cream, Place rectally 2 (two) times daily. (Patient not taking: Reported on 08/26/2018), Disp: 60 g, Rfl: 3 .  neomycin-polymyxin b-dexamethasone (MAXITROL) 3.5-10000-0.1 SUSP, INSTILL 1 DROP INTO AFFECTED EYE(S) 4 TIMES DAILY, Disp: , Rfl:  .  prednisoLONE acetate (PRED FORTE) 1 % ophthalmic suspension, INSTILL 1 DROP INTO AFFECTED EYE(S) 4 TIMES DAILY,  Disp: , Rfl:  .  PROCTOZONE-HC 2.5 % rectal cream, APPLY 1 CREAM RECTALLY TWICE DAILY, Disp: , Rfl:  .  terbinafine (LAMISIL) 250 MG tablet, Take 1 tablet (250 mg total) by mouth daily., Disp: 30 tablet, Rfl: 0 .  triamcinolone (NASACORT) 55 MCG/ACT AERO nasal inhaler, Place 2 sprays into the nose daily. (Patient not taking: Reported on 08/26/2018), Disp: 1 Inhaler, Rfl: 12  Allergies:  Allergies  Allergen Reactions  . Metformin And Related Diarrhea    Past Medical History, Surgical history, Social history, and Family History were reviewed and updated.  Review of Systems: Review of Systems  Constitutional:  Negative.   HENT: Negative.   Eyes: Negative.   Respiratory: Negative.   Cardiovascular: Negative.   Gastrointestinal: Negative.   Genitourinary: Negative.   Musculoskeletal: Negative.   Skin: Negative.   Neurological: Negative.   Endo/Heme/Allergies: Negative.   Psychiatric/Behavioral: Negative.     Physical Exam:  weight is 180 lb 1.9 oz (81.7 kg). His oral temperature is 97.1 F (36.2 C) (abnormal). His blood pressure is 127/77 and his pulse is 60. His respiration is 20 and oxygen saturation is 100%.   Physical Exam Vitals signs reviewed.  HENT:     Head: Normocephalic and atraumatic.  Eyes:     Pupils: Pupils are equal, round, and reactive to light.  Neck:     Musculoskeletal: Normal range of motion.  Cardiovascular:     Rate and Rhythm: Normal rate and regular rhythm.     Heart sounds: Normal heart sounds.  Pulmonary:     Effort: Pulmonary effort is normal.     Breath sounds: Normal breath sounds.  Abdominal:     General: Bowel sounds are normal.     Palpations: Abdomen is soft.  Musculoskeletal: Normal range of motion.        General: No tenderness or deformity.  Lymphadenopathy:     Cervical: No cervical adenopathy.  Skin:    General: Skin is warm and dry.     Findings: No erythema or rash.  Neurological:     Mental Status: He is alert and oriented to person, place, and time.  Psychiatric:        Behavior: Behavior normal.        Thought Content: Thought content normal.        Judgment: Judgment normal.      Lab Results  Component Value Date   WBC 11.4 (H) 08/26/2018   HGB 15.2 08/26/2018   HCT 47.2 08/26/2018   MCV 93.8 08/26/2018   PLT 226 08/26/2018     Chemistry      Component Value Date/Time   NA 141 08/26/2018 1141   NA 144 11/29/2016 1054   NA 139 12/29/2015 0954   K 4.5 08/26/2018 1141   K 4.2 11/29/2016 1054   K 4.1 12/29/2015 0954   CL 105 08/26/2018 1141   CL 102 11/29/2016 1054   CO2 29 08/26/2018 1141   CO2 30 11/29/2016 1054    CO2 26 12/29/2015 0954   BUN 25 (H) 08/26/2018 1141   BUN 22 11/29/2016 1054   BUN 20.0 12/29/2015 0954   CREATININE 1.35 (H) 08/26/2018 1141   CREATININE 1.2 11/29/2016 1054   CREATININE 1.3 12/29/2015 0954      Component Value Date/Time   CALCIUM 9.3 08/26/2018 1141   CALCIUM 9.3 11/29/2016 1054   CALCIUM 9.5 12/29/2015 0954   ALKPHOS 72 08/26/2018 1141   ALKPHOS 92 (H) 11/29/2016 1054   ALKPHOS 98  12/29/2015 0954   AST 14 (L) 08/26/2018 1141   AST 16 12/29/2015 0954   ALT 14 08/26/2018 1141   ALT 20 11/29/2016 1054   ALT 13 12/29/2015 0954   BILITOT 0.5 08/26/2018 1141   BILITOT 0.71 12/29/2015 0954      Impression and Plan: Alexander [name] is 78 year old gentleman. He has polycythemia. He is JAK2 negative however.  For right now, I think we will phlebotomize him.  Again I want to make sure that we try to keep his hematocrit below 45%.   I will get him back in 3 months.  We may have to phlebotomize him when we see him back in April.  As always, it is a lot of fun talking to he and his wife about what is going on in the world   [name], MD 8/24/20201:35 PM

## 2018-08-27 LAB — IRON AND TIBC
Iron: 41 ug/dL — ABNORMAL LOW (ref 42–163)
Saturation Ratios: 11 % — ABNORMAL LOW (ref 20–55)
TIBC: 360 ug/dL (ref 202–409)
UIBC: 319 ug/dL (ref 117–376)

## 2018-08-27 LAB — FERRITIN: Ferritin: 28 ng/mL (ref 24–336)

## 2018-08-28 ENCOUNTER — Encounter: Payer: Self-pay | Admitting: Adult Health

## 2018-08-30 ENCOUNTER — Other Ambulatory Visit: Payer: Self-pay

## 2018-08-30 ENCOUNTER — Ambulatory Visit (INDEPENDENT_AMBULATORY_CARE_PROVIDER_SITE_OTHER): Payer: Medicare HMO | Admitting: Family Medicine

## 2018-08-30 DIAGNOSIS — Z23 Encounter for immunization: Secondary | ICD-10-CM

## 2018-09-13 ENCOUNTER — Encounter: Payer: Self-pay | Admitting: Adult Health

## 2018-09-17 ENCOUNTER — Other Ambulatory Visit: Payer: Self-pay | Admitting: Adult Health

## 2018-09-17 DIAGNOSIS — Z760 Encounter for issue of repeat prescription: Secondary | ICD-10-CM

## 2018-09-18 DIAGNOSIS — L814 Other melanin hyperpigmentation: Secondary | ICD-10-CM | POA: Diagnosis not present

## 2018-09-18 DIAGNOSIS — L821 Other seborrheic keratosis: Secondary | ICD-10-CM | POA: Diagnosis not present

## 2018-09-18 DIAGNOSIS — L700 Acne vulgaris: Secondary | ICD-10-CM | POA: Diagnosis not present

## 2018-09-18 DIAGNOSIS — D225 Melanocytic nevi of trunk: Secondary | ICD-10-CM | POA: Diagnosis not present

## 2018-09-18 DIAGNOSIS — L570 Actinic keratosis: Secondary | ICD-10-CM | POA: Diagnosis not present

## 2018-09-18 DIAGNOSIS — Z23 Encounter for immunization: Secondary | ICD-10-CM | POA: Diagnosis not present

## 2018-09-25 ENCOUNTER — Encounter: Payer: Self-pay | Admitting: Adult Health

## 2018-09-25 ENCOUNTER — Other Ambulatory Visit: Payer: Self-pay | Admitting: Adult Health

## 2018-09-25 MED ORDER — PROCTOZONE-HC 2.5 % EX CREA: TOPICAL_CREAM | CUTANEOUS | 1 refills | Status: DC

## 2018-09-27 ENCOUNTER — Other Ambulatory Visit: Payer: Self-pay

## 2018-09-27 ENCOUNTER — Encounter: Payer: Self-pay | Admitting: Adult Health

## 2018-09-27 ENCOUNTER — Ambulatory Visit (INDEPENDENT_AMBULATORY_CARE_PROVIDER_SITE_OTHER): Payer: Medicare HMO | Admitting: Adult Health

## 2018-09-27 VITALS — BP 124/80 | HR 51 | Temp 98.6°F | Ht 71.0 in | Wt 179.6 lb

## 2018-09-27 DIAGNOSIS — I10 Essential (primary) hypertension: Secondary | ICD-10-CM

## 2018-09-27 DIAGNOSIS — Z0000 Encounter for general adult medical examination without abnormal findings: Secondary | ICD-10-CM | POA: Diagnosis not present

## 2018-09-27 DIAGNOSIS — E785 Hyperlipidemia, unspecified: Secondary | ICD-10-CM

## 2018-09-27 DIAGNOSIS — N400 Enlarged prostate without lower urinary tract symptoms: Secondary | ICD-10-CM | POA: Diagnosis not present

## 2018-09-27 DIAGNOSIS — M1000 Idiopathic gout, unspecified site: Secondary | ICD-10-CM

## 2018-09-27 DIAGNOSIS — R7303 Prediabetes: Secondary | ICD-10-CM

## 2018-09-27 DIAGNOSIS — K219 Gastro-esophageal reflux disease without esophagitis: Secondary | ICD-10-CM | POA: Diagnosis not present

## 2018-09-27 DIAGNOSIS — D45 Polycythemia vera: Secondary | ICD-10-CM | POA: Diagnosis not present

## 2018-09-27 LAB — COMPREHENSIVE METABOLIC PANEL
ALT: 12 U/L (ref 0–53)
AST: 14 U/L (ref 0–37)
Albumin: 4.4 g/dL (ref 3.5–5.2)
Alkaline Phosphatase: 79 U/L (ref 39–117)
BUN: 27 mg/dL — ABNORMAL HIGH (ref 6–23)
CO2: 25 mEq/L (ref 19–32)
Calcium: 9.8 mg/dL (ref 8.4–10.5)
Chloride: 103 mEq/L (ref 96–112)
Creatinine, Ser: 1.27 mg/dL (ref 0.40–1.50)
GFR: 54.76 mL/min — ABNORMAL LOW (ref >60.00)
Glucose, Bld: 99 mg/dL (ref 70–99)
Potassium: 4.6 mEq/L (ref 3.5–5.1)
Sodium: 138 mEq/L (ref 135–145)
Total Bilirubin: 0.5 mg/dL (ref 0.2–1.2)
Total Protein: 7.1 g/dL (ref 6.0–8.3)

## 2018-09-27 LAB — LIPID PANEL
Cholesterol: 168 mg/dL (ref 0–200)
HDL: 47 mg/dL (ref >39.00)
LDL Cholesterol: 107 mg/dL — ABNORMAL HIGH (ref 0–99)
NonHDL: 121.03
Total CHOL/HDL Ratio: 4
Triglycerides: 68 mg/dL (ref 0.0–149.0)
VLDL: 13.6 mg/dL (ref 0.0–40.0)

## 2018-09-27 LAB — CBC WITH DIFFERENTIAL/PLATELET
Basophils Absolute: 0.1 10*3/uL (ref 0.0–0.1)
Basophils Relative: 1.1 % (ref 0.0–3.0)
Eosinophils Absolute: 0.6 10*3/uL (ref 0.0–0.7)
Eosinophils Relative: 6.6 % — ABNORMAL HIGH (ref 0.0–5.0)
HCT: 43.2 % (ref 39.0–52.0)
Hemoglobin: 14.5 g/dL (ref 13.0–17.0)
Lymphocytes Relative: 23.5 % (ref 12.0–46.0)
Lymphs Abs: 2 10*3/uL (ref 0.7–4.0)
MCHC: 33.4 g/dL (ref 30.0–36.0)
MCV: 90.5 fl (ref 78.0–100.0)
Monocytes Absolute: 1.1 10*3/uL — ABNORMAL HIGH (ref 0.1–1.0)
Monocytes Relative: 13 % — ABNORMAL HIGH (ref 3.0–12.0)
Neutro Abs: 4.7 10*3/uL (ref 1.4–7.7)
Neutrophils Relative %: 55.8 % (ref 43.0–77.0)
Platelets: 249 10*3/uL (ref 150.0–400.0)
RBC: 4.78 Mil/uL (ref 4.22–5.81)
RDW: 15.9 % — ABNORMAL HIGH (ref 11.5–15.5)
WBC: 8.5 10*3/uL (ref 4.0–10.5)

## 2018-09-27 LAB — PSA: PSA: 0.58 ng/mL (ref 0.10–4.00)

## 2018-09-27 LAB — TSH: TSH: 0.88 u[IU]/mL (ref 0.35–4.50)

## 2018-09-27 LAB — HEMOGLOBIN A1C: Hgb A1c MFr Bld: 6.3 % (ref 4.6–6.5)

## 2018-09-27 NOTE — Progress Notes (Signed)
Subjective:    Patient ID: Alexander [name], male    DOB: [birth year], 78 y.o.   MRN: [id number]  HPI  Patient presents for yearly preventative medicine examination. He is a pleasant 78 year old male who  has a past medical history of Arthritis, CAD (coronary artery disease), Diverticulosis of colon, GERD (gastroesophageal reflux disease), GI bleed, Gout, Hematuria, microscopic, Hemorrhoids, Hyperglycemia, Hyperlipidemia, Hypertension, Lumbar back pain, Microalbuminuria, Overweight(278.02), Polycythemia rubra vera ([place]), and Tubular adenoma of colon (07/2012).  Essential Hypertension -takes lisinopril 10 mg and Norvasc 10 mg.  He denies dizziness, lightheadedness, chest pain, shortness of breath, or syncopal episodes  BP Readings from Last 3 Encounters:  09/27/18 124/80  08/26/18 136/76  08/26/18 127/77   Hyperlipidemia-controlled with pravastatin 40 mg and heart healthy diet.  He denies myalgia or fatigue Lab Results  Component Value Date   CHOL 155 09/06/2017   HDL 54.60 09/06/2017   LDLCALC 87 09/06/2017   TRIG 67.0 09/06/2017   CHOLHDL 3 09/06/2017   GERD -takes Protonix 40 mg daily.  He feels well controlled on this medication  Gout-takes allopurinol 100 mg daily.  He denies recent flares  Polycythemia vera-JAK2 negative.  He is followed by Dr. Marin Olp was last seen in August 2020  All immunizations and health maintenance protocols were reviewed with the patient and needed orders were placed.  Vaccinations are up-to-date  Appropriate screening laboratory values were ordered for the patient including screening of hyperlipidemia, renal function and hepatic function. If indicated by BPH, a PSA was ordered.  Medication reconciliation,  past medical history, social history, problem list and allergies were reviewed in detail with the patient  Goals were established with regard to weight loss, exercise, and  diet in compliance with medications.  He eats a heart healthy diet and  swims about 2 miles per day Wt Readings from Last 3 Encounters:  09/27/18 179 lb 9.6 oz (81.5 kg)  08/26/18 180 lb 1.9 oz (81.7 kg)  05/23/18 182 lb (82.6 kg)   End of life planning was discussed. He has an advanced directive and living will   No acute issues today  Review of Systems  Constitutional: Negative.   HENT: Negative.   Eyes: Negative.   Respiratory: Negative.   Cardiovascular: Negative.   Gastrointestinal: Negative.   Endocrine: Negative.   Genitourinary: Negative.   Musculoskeletal: Negative.   Skin: Negative.   Allergic/Immunologic: Negative.   Neurological: Negative.   Hematological: Negative.   Psychiatric/Behavioral: Negative.   All other systems reviewed and are negative.  Past Medical History:  Diagnosis Date  . Arthritis    "minor; shoulders primarily" (05/20/2013)  . CAD (coronary artery disease)    a. 05/2013 - Chest pain/unstable angina and dynamic EKG changes showing cath showing atherosclerotic coronary artery disease manifested as diffuse ectasia, normal EF.  . Diverticulosis of colon   . GERD (gastroesophageal reflux disease)   . GI bleed    secondary to Aspirin  . Gout   . Hematuria, microscopic   . Hemorrhoids   . Hyperglycemia   . Hyperlipidemia   . Hypertension   . Lumbar back pain   . Microalbuminuria   . Overweight(278.02)   . Polycythemia rubra vera ([place])   . Tubular adenoma of colon 07/2012    Social History   Socioeconomic History  . Marital status: Married    Spouse name: Not on file  . Number of children: 3  . Years of education: Not on file  . Highest education level:  Not on file  Occupational History  . Occupation: retired Lobbyist: RETIRED  Social Needs  . Financial resource strain: Not on file  . Food insecurity    Worry: Not on file    Inability: Not on file  . Transportation needs    Medical: Not on file    Non-medical: Not on file  Tobacco Use  . Smoking status: Never Smoker  . Smokeless  tobacco: Never Used  . Tobacco comment: NEVER USED TOBACCO  Substance and Sexual Activity  . Alcohol use: No    Alcohol/week: 0.0 standard drinks  . Drug use: No  . Sexual activity: Yes    Partners: Female  Lifestyle  . Physical activity    Days per week: Not on file    Minutes per session: Not on file  . Stress: Not on file  Relationships  . Social Herbalist on phone: Not on file    Gets together: Not on file    Attends religious service: Not on file    Active member of club or organization: Not on file    Attends meetings of clubs or organizations: Not on file    Relationship status: Not on file  . Intimate partner violence    Fear of current or ex partner: Not on file    Emotionally abused: Not on file    Physically abused: Not on file    Forced sexual activity: Not on file  Other Topics Concern  . Not on file  Social History Narrative   Marriedfor 44 years    Daughter, [name] (pediatrician--lives in [place], [place]), one in [place] - Engineer, water. One in [place] - Chief Strategy Officer           Past Surgical History:  Procedure Laterality Date  . CARDIAC CATHETERIZATION  05/20/2013  . CATARACT EXTRACTION W/ INTRAOCULAR LENS  IMPLANT, BILATERAL Bilateral 2008  . CYSTECTOMY  ~ 2000   " SEBACEOUS cyst removed from between shoulder blades"  . EXCISIONAL HEMORRHOIDECTOMY  1970's  . INGUINAL HERNIA REPAIR Right ~ 2010  . LEFT HEART CATHETERIZATION WITH CORONARY ANGIOGRAM N/A 05/20/2013   Procedure: LEFT HEART CATHETERIZATION WITH CORONARY ANGIOGRAM;  Surgeon: Peter M Martinique, MD;  Location[facility] CATH LAB;  Service: Cardiovascular;  Laterality: N/A;  . TONSILLECTOMY AND ADENOIDECTOMY  1940's  . VASECTOMY      Family History  Problem Relation Age of Onset  . Colon cancer Mother        died at 37, colorectal  . Diabetes Mother   . Heart disease Father 86       MI  . Diabetes Father   . Stomach cancer Maternal Grandfather   . Cancer Maternal Uncle     Allergies  Allergen  Reactions  . Metformin And Related Diarrhea    Current Outpatient Medications on File Prior to Visit  Medication Sig Dispense Refill  . ACCU-CHEK SOFTCLIX LANCETS lancets Use to test blood glucose once daily 100 each 3  . allopurinol (ZYLOPRIM) 100 MG tablet Take 1 tablet by mouth once daily 90 tablet 0  . amLODipine (NORVASC) 10 MG tablet Take 1 tablet by mouth once daily 90 tablet 0  . aspirin EC 81 MG tablet Take 1 tablet (81 mg total) by mouth daily. 90 tablet 3  . Blood Glucose Monitoring Suppl (ACCU-CHEK AVIVA PLUS) w/Device KIT Use to test blood glucose once daily 1 kit 0  . Coenzyme Q10 300 MG CAPS Take 1 capsule by mouth daily.     [name]  diphenhydrAMINE (BENADRYL) 2 % cream Apply topically 3 (three) times daily as needed for itching.    [name] glucose blood (ACCU-CHEK AVIVA) test strip Use to test blood glucose once daily 100 each 3  . lisinopril (ZESTRIL) 10 MG tablet Take 1 tablet by mouth once daily 90 tablet 0  . pantoprazole (PROTONIX) 40 MG tablet TAKE 1 TABLET BY MOUTH ONCE DAILY 90 tablet 3  . pravastatin (PRAVACHOL) 40 MG tablet Take 1 tablet by mouth once daily 90 tablet 0  . PREVIDENT 5000 ENAMEL PROTECT 1.1-5 % PSTE See admin instructions.    [name] PROCTOZONE-HC 2.5 % rectal cream APPLY 1 CREAM RECTALLY TWICE DAILY 90 g 1   No current facility-administered medications on file prior to visit.     BP 124/80 (BP Location: Left Arm, Patient Position: Sitting, Cuff Size: Normal)   Pulse (!) 51   Temp 98.6 F (37 C) (Temporal)   Ht '5\' 11"'  (1.803 m)   Wt 179 lb 9.6 oz (81.5 kg)   SpO2 97%   BMI 25.05 kg/m       Objective:   Physical Exam Vitals signs and nursing note reviewed.  Constitutional:      General: He is not in acute distress.    Appearance: Normal appearance. He is not diaphoretic.  HENT:     Head: Normocephalic and atraumatic.     Right Ear: Tympanic membrane, ear canal and external ear normal. There is no impacted cerumen.     Left Ear: Tympanic membrane, ear  canal and external ear normal. There is no impacted cerumen.     Nose: Nose normal. No congestion or rhinorrhea.     Mouth/Throat:     Mouth: Mucous membranes are moist.     Pharynx: Oropharynx is clear. No oropharyngeal exudate or posterior oropharyngeal erythema.  Eyes:     General: No scleral icterus.       Right eye: No discharge.        Left eye: No discharge.     Extraocular Movements: Extraocular movements intact.     Conjunctiva/sclera: Conjunctivae normal.     Pupils: Pupils are equal, round, and reactive to light.  Neck:     Musculoskeletal: Normal range of motion and neck supple.     Thyroid: No thyromegaly.     Vascular: No JVD.     Trachea: No tracheal deviation.  Cardiovascular:     Rate and Rhythm: Normal rate and regular rhythm.     Pulses: Normal pulses.     Heart sounds: Normal heart sounds. No murmur. No friction rub. No gallop.   Pulmonary:     Effort: Pulmonary effort is normal. No respiratory distress.     Breath sounds: Normal breath sounds. No stridor. No wheezing, rhonchi or rales.  Chest:     Chest wall: No tenderness.  Abdominal:     General: Abdomen is flat. Bowel sounds are normal. There is no distension.     Palpations: Abdomen is soft. There is no mass.     Tenderness: There is no abdominal tenderness. There is no right CVA tenderness, left CVA tenderness, guarding or rebound.     Hernia: No hernia is present.  Musculoskeletal: Normal range of motion.        General: No swelling, tenderness, deformity or signs of injury.     Right lower leg: No edema.     Left lower leg: No edema.  Lymphadenopathy:     Cervical: No cervical adenopathy.  Skin:  General: Skin is warm and dry.     Capillary Refill: Capillary refill takes less than 2 seconds.     Coloration: Skin is not jaundiced or pale.     Findings: No bruising, erythema, lesion or rash.  Neurological:     General: No focal deficit present.     Mental Status: He is alert and oriented to  person, place, and time. Mental status is at baseline.     Cranial Nerves: No cranial nerve deficit.     Sensory: No sensory deficit.     Motor: No weakness or abnormal muscle tone.     Coordination: Coordination normal.     Gait: Gait normal.     Deep Tendon Reflexes: Reflexes are normal and symmetric. Reflexes normal.  Psychiatric:        Mood and Affect: Mood normal.        Behavior: Behavior normal.        Thought Content: Thought content normal.        Judgment: Judgment normal.       Assessment & Plan:  1. Routine general medical examination at a health care facility - Remarkable 78 year old male- benign exam.  - Continue to exercise and eat healthy  -Follow up in one year or sooner if needed  - CBC with Differential/Platelet - Comprehensive metabolic panel - Lipid panel - TSH  2. Hyperlipidemia, unspecified hyperlipidemia type - Continue with Pravastatin  - CBC with Differential/Platelet - Comprehensive metabolic panel - Lipid panel - TSH  3. Idiopathic gout, unspecified chronicity, unspecified site - Continue with allopurinol  4. Essential hypertension - Well controlled.  - No change in medication  - CBC with Differential/Platelet - Comprehensive metabolic panel - Lipid panel - TSH  5. Gastroesophageal reflux disease without esophagitis - continue PPI  6. Polycythemia vera ([place]) - Follow up with Hematology/Oncology as directed - CBC with Differential/Platelet - Comprehensive metabolic panel - Lipid panel - TSH  7. Prediabetes  - Hemoglobin A1c  8. Benign prostatic hyperplasia without lower urinary tract symptoms - PSA  [name], NP

## 2018-10-01 ENCOUNTER — Telehealth: Payer: Self-pay | Admitting: Adult Health

## 2018-10-01 NOTE — Telephone Encounter (Signed)
PROCTOZONE-HC 2.5 % rectal cream  This med is begin requested to be amended. Script says DA1 do not have 3 tubes. Has "Proto Med" which is the same thing but it needs to be submitted with a DAW. Would like permission for product change.  [facility] [address], [place] [phone] (Phone) [phone] (Fax)

## 2018-10-01 NOTE — Telephone Encounter (Signed)
That is fine 

## 2018-10-02 MED ORDER — HYDROCORTISONE (PERIANAL) 2.5 % EX CREA: TOPICAL_CREAM | CUTANEOUS | 0 refills | Status: DC

## 2018-10-02 MED ORDER — PROCTO-MED HC 2.5 % EX CREA: TOPICAL_CREAM | CUTANEOUS | 0 refills | Status: DC

## 2018-10-02 NOTE — Telephone Encounter (Signed)
Rx sent to the pharmacy by e-scribe. 

## 2018-10-12 ENCOUNTER — Other Ambulatory Visit: Payer: Self-pay | Admitting: Adult Health

## 2018-10-15 ENCOUNTER — Encounter: Payer: Self-pay | Admitting: Adult Health

## 2018-10-15 NOTE — Telephone Encounter (Signed)
Patient called in stating he is completely out of medication and is needing it today. Please advise.

## 2018-10-15 NOTE — Telephone Encounter (Signed)
Sent to the pharmacy by e-scribe. 

## 2018-10-17 ENCOUNTER — Encounter: Payer: Self-pay | Admitting: Adult Health

## 2018-10-18 ENCOUNTER — Telehealth (INDEPENDENT_AMBULATORY_CARE_PROVIDER_SITE_OTHER): Payer: Medicare HMO | Admitting: Adult Health

## 2018-10-18 ENCOUNTER — Other Ambulatory Visit: Payer: Self-pay

## 2018-10-18 DIAGNOSIS — T148XXA Other injury of unspecified body region, initial encounter: Secondary | ICD-10-CM | POA: Diagnosis not present

## 2018-10-18 NOTE — Progress Notes (Signed)
Virtual Visit via Video Note  I connected with Alexander [name] on 10/18/18 at  1:00 PM EDT by a video enabled telemedicine application and verified that I am speaking with the correct person using two identifiers.  Location patient: home Location provider:work or home office Persons participating in the virtual visit: patient, provider  I discussed the limitations of evaluation and management by telemedicine and the availability of in person appointments. The patient expressed understanding and agreed to proceed.   HPI: He is being evaluated today for pain in his right shoulder and neck.  Pain has been present for 4 to 5 days.  He denies radiating pain down his right arm or up his jaw.  Pain was not controlled with Tylenol and was causing loss of sleep.  When he woke up this morning the pain had resolved and has not come back throughout the day.  Denies shortness of breath or chest pain.  He has not experienced fevers, chills, nausea, or vomiting.      ROS: See pertinent positives and negatives per HPI.  Past Medical History:  Diagnosis Date  . Arthritis    "minor; shoulders primarily" (05/20/2013)  . CAD (coronary artery disease)    a. 05/2013 - Chest pain/unstable angina and dynamic EKG changes showing cath showing atherosclerotic coronary artery disease manifested as diffuse ectasia, normal EF.  . Diverticulosis of colon   . GERD (gastroesophageal reflux disease)   . GI bleed    secondary to Aspirin  . Gout   . Hematuria, microscopic   . Hemorrhoids   . Hyperglycemia   . Hyperlipidemia   . Hypertension   . Lumbar back pain   . Microalbuminuria   . Overweight(278.02)   . Polycythemia rubra vera ([place])   . Tubular adenoma of colon 07/2012    Past Surgical History:  Procedure Laterality Date  . CARDIAC CATHETERIZATION  05/20/2013  . CATARACT EXTRACTION W/ INTRAOCULAR LENS  IMPLANT, BILATERAL Bilateral 2008  . CYSTECTOMY  ~ 2000   " SEBACEOUS cyst removed from between  shoulder blades"  . EXCISIONAL HEMORRHOIDECTOMY  1970's  . INGUINAL HERNIA REPAIR Right ~ 2010  . LEFT HEART CATHETERIZATION WITH CORONARY ANGIOGRAM N/A 05/20/2013   Procedure: LEFT HEART CATHETERIZATION WITH CORONARY ANGIOGRAM;  Surgeon: Peter M Martinique, MD;  Location: [facility] CATH LAB;  Service: Cardiovascular;  Laterality: N/A;  . TONSILLECTOMY AND ADENOIDECTOMY  1940's  . VASECTOMY      Family History  Problem Relation Age of Onset  . Colon cancer Mother        died at 29, colorectal  . Diabetes Mother   . Heart disease Father 25       MI  . Diabetes Father   . Stomach cancer Maternal Grandfather   . Cancer Maternal Uncle      Current Outpatient Medications:  .  ACCU-CHEK SOFTCLIX LANCETS lancets, Use to test blood glucose once daily, Disp: 100 each, Rfl: 3 .  allopurinol (ZYLOPRIM) 100 MG tablet, Take 1 tablet by mouth once daily, Disp: 90 tablet, Rfl: 0 .  amLODipine (NORVASC) 10 MG tablet, Take 1 tablet by mouth once daily, Disp: 90 tablet, Rfl: 0 .  aspirin EC 81 MG tablet, Take 1 tablet (81 mg total) by mouth daily., Disp: 90 tablet, Rfl: 3 .  Blood Glucose Monitoring Suppl (ACCU-CHEK AVIVA PLUS) w/Device KIT, Use to test blood glucose once daily, Disp: 1 kit, Rfl: 0 .  Coenzyme Q10 300 MG CAPS, Take 1 capsule by mouth daily. , Disp: ,  Rfl:  .  diphenhydrAMINE (BENADRYL) 2 % cream, Apply topically 3 (three) times daily as needed for itching., Disp: , Rfl:  .  glucose blood (ACCU-CHEK AVIVA) test strip, Use to test blood glucose once daily, Disp: 100 each, Rfl: 3 .  lisinopril (ZESTRIL) 10 MG tablet, Take 1 tablet by mouth once daily, Disp: 90 tablet, Rfl: 0 .  pantoprazole (PROTONIX) 40 MG tablet, Take 1 tablet by mouth once daily, Disp: 90 tablet, Rfl: 3 .  pravastatin (PRAVACHOL) 40 MG tablet, Take 1 tablet by mouth once daily, Disp: 90 tablet, Rfl: 0 .  PREVIDENT 5000 ENAMEL PROTECT 1.1-5 % PSTE, See admin instructions., Disp: , Rfl:  .  PROCTO-MED HC 2.5 % rectal cream, APPLY  1 CREAM RECTALLY TWICE DAILY, Disp: 30 g, Rfl: 0  EXAM:  VITALS per patient if applicable:  GENERAL: alert, oriented, appears well and in no acute distress  HEENT: atraumatic, conjunttiva clear, no obvious abnormalities on inspection of external nose and ears  NECK: normal movements of the head and neck  LUNGS: on inspection no signs of respiratory distress, breathing rate appears normal, no obvious gross SOB, gasping or wheezing  CV: no obvious cyanosis  MS: moves all visible extremities without noticeable abnormality  PSYCH/NEURO: pleasant and cooperative, no obvious depression or anxiety, speech and thought processing grossly intact  ASSESSMENT AND PLAN:  Discussed the following assessment and plan:  1. Muscle strain -Seems to have resolved.  Advised conservative measures of pain comes back over the weekend such as Tylenol/Motrin, gentle stretching exercises and warm compress.    I discussed the assessment and treatment plan with the patient. The patient was provided an opportunity to ask questions and all were answered. The patient agreed with the plan and demonstrated an understanding of the instructions.   The patient was advised to call back or seek an in-person evaluation if the symptoms worsen or if the condition fails to improve as anticipated.   [name], NP

## 2018-10-24 ENCOUNTER — Ambulatory Visit: Payer: Medicare HMO | Admitting: Adult Health

## 2018-10-28 IMAGING — MR MR HEAD WO/W CM
12 series · 48 of 48 positions shown · IV contrast (multihance)
Comparison: Head and cervical spine CT without contrast 11/05/2010

CLINICAL DATA: 76-year-old male with fall 2 months ago striking the
front of his head. Double vision in the left eye. Numbness in both
arms. Intermittent numbness in the right upper back.

EXAM:
MRI HEAD WITHOUT AND WITH CONTRAST
TECHNIQUE: Multiplanar, multiecho pulse sequences of the brain and surrounding
structures were obtained without and with intravenous contrast.
CONTRAST:  18mL MULTIHANCE GADOBENATE DIMEGLUMINE 529 MG/ML IV SOLN

[Series 2: t1_se_sag · sagittal · 5.0mm · 0.45mm/px · 1 of 26 slices shown]
[im 1/26]
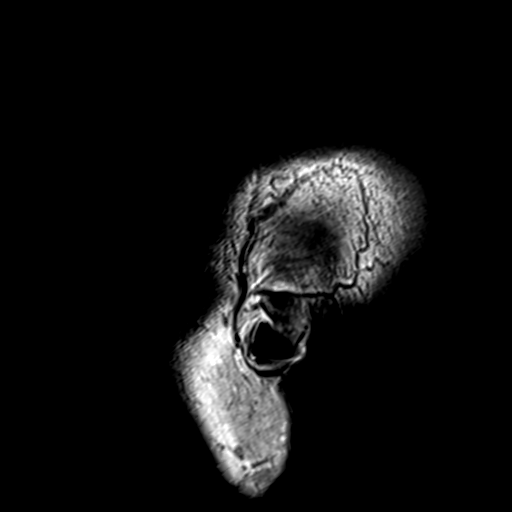

[Series 3: ep2d_diff_(id)_trace · axial · 3.0mm · 1.80mm/px · z∈[-32,+130]mm · 5 of 106 slices shown]
[im 1/106]
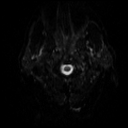
[im 27/106]
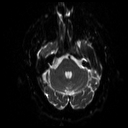
[im 53/106]
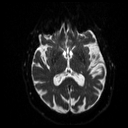
[im 79/106]
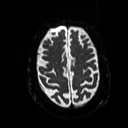
[im 106/106]
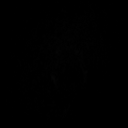

[Series 4: ep2d_diff_(id)_trace_adc · axial · 3.0mm · 1.80mm/px · z∈[-32,+130]mm · 3 of 54 slices shown]
[im 1/54]
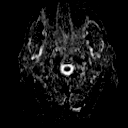
[im 27/54]
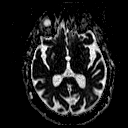
[im 54/54]
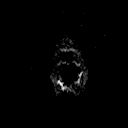

[Series 5: ep2d_diff_cor · coronal · 5.0mm · 1.77mm/px · 4 of 60 slices shown]
[im 1/60]
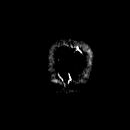
[im 20/60]
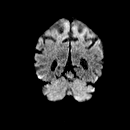
[im 40/60]
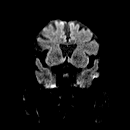
[im 60/60]
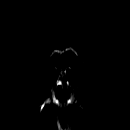

[Series 6: ep2d_diff_cor_adc · coronal · 5.0mm · 1.77mm/px · 2 of 30 slices shown]
[im 1/30]
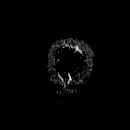
[im 30/30]
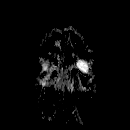

[Series 8: swi_images · axial · 2.0mm · 0.90mm/px · z∈[-25,+132]mm · 5 of 80 slices shown]
[im 1/80]
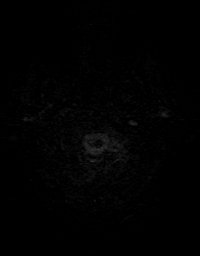
[im 20/80]
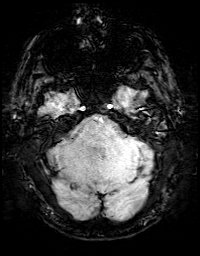
[im 40/80]
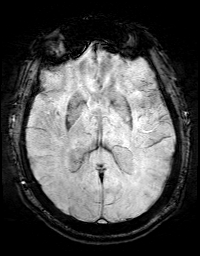
[im 60/80]
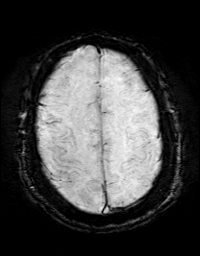
[im 80/80]
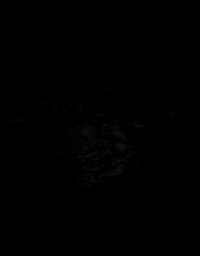

[Series 9: FLAIR · axial · 3.0mm · 0.43mm/px · z∈[-38,+136]mm · 2 of 30 slices shown]
[im 1/30]
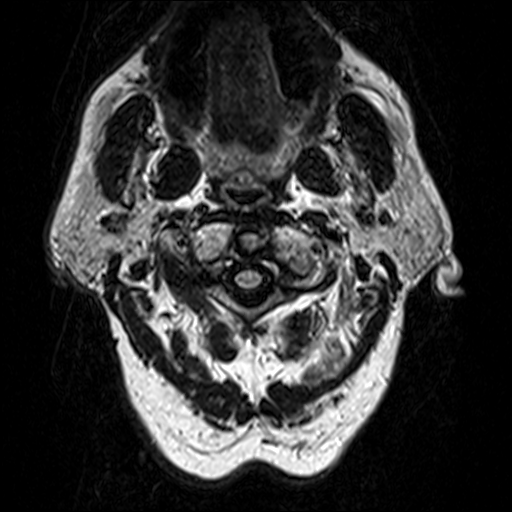
[im 30/30]
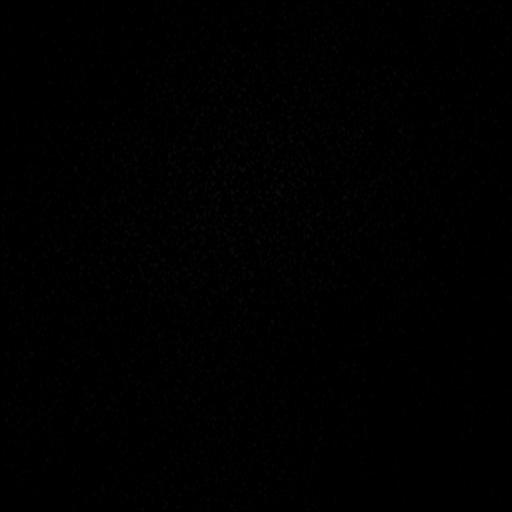

[Series 10: t2_tse_tra_512 · axial · 3.0mm · 0.60mm/px · z∈[-38,+136]mm · 2 of 30 slices shown]
[im 1/30]
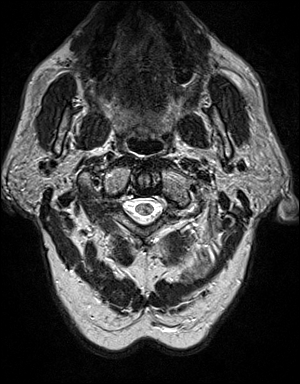
[im 30/30]
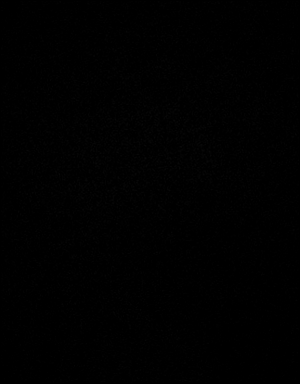

[Series 11: t1_mpr_tra · axial · 1.0mm · 0.72mm/px · z∈[-30,+129]mm · 10 of 160 slices shown]
[im 1/160]
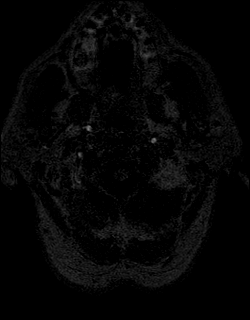
[im 18/160]
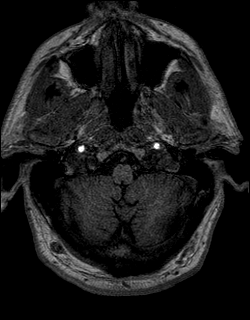
[im 36/160]
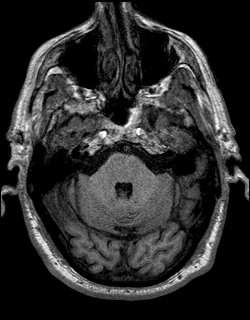
[im 54/160]
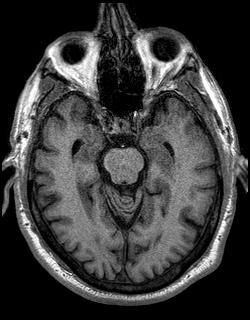
[im 71/160]
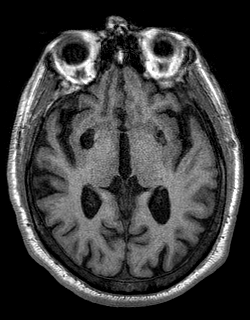
[im 89/160]
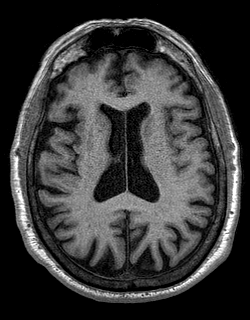
[im 107/160]
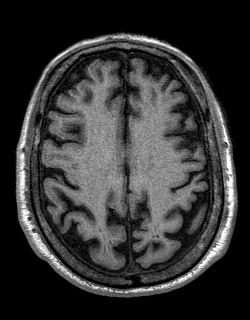
[im 124/160]
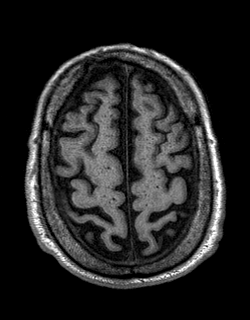
[im 142/160]
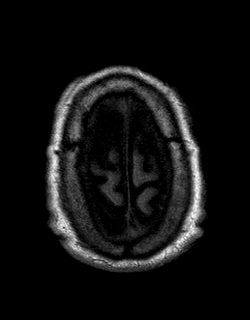
[im 160/160]
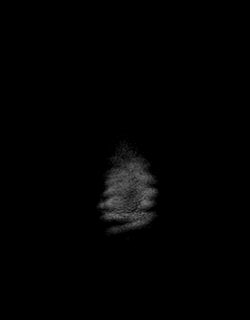

[Series 12: T2 · coronal · 5.0mm · 0.45mm/px · 2 of 30 slices shown]
[im 1/30]
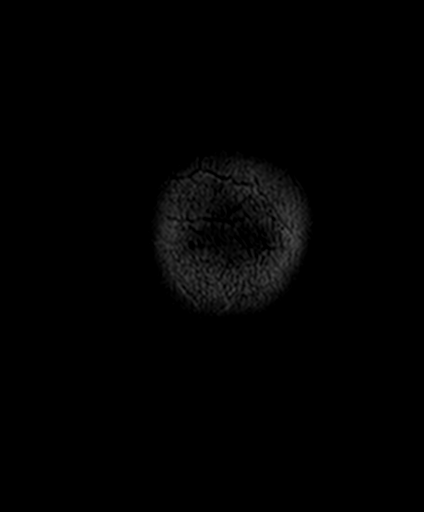
[im 30/30]
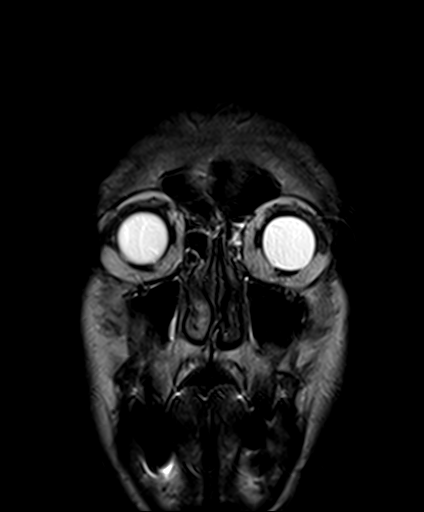

[Series 13: T1 post-contrast · coronal · 5.0mm · 0.72mm/px · 2 of 30 slices shown]
[im 1/30]
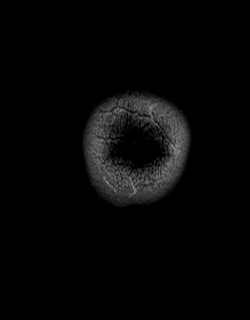
[im 30/30]
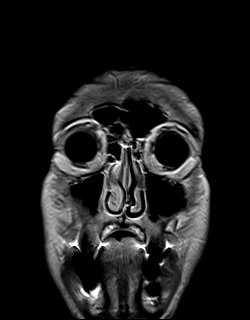

[Series 14: post t1_mpr_tra · axial · 1.0mm · 0.72mm/px · z∈[-49,+110]mm · 10 of 160 slices shown]
[im 1/160]
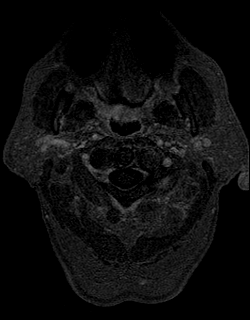
[im 18/160]
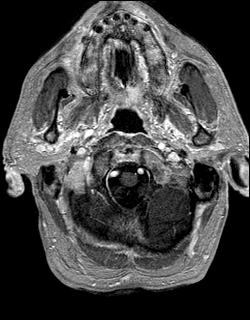
[im 36/160]
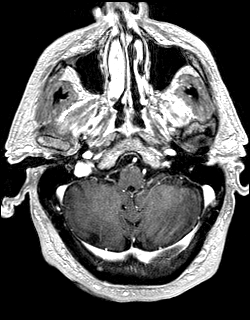
[im 54/160]
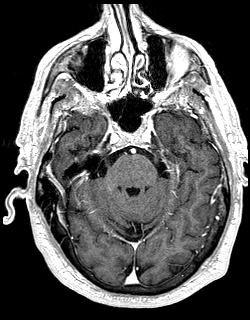
[im 71/160]
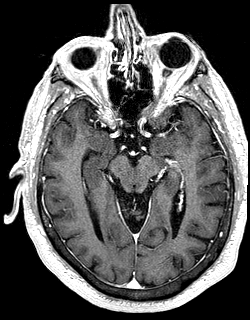
[im 89/160]
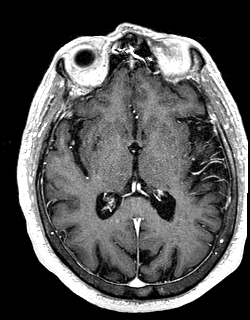
[im 107/160]
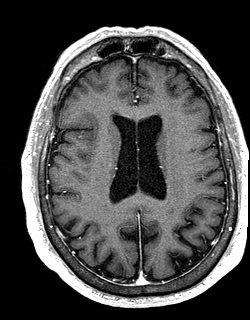
[im 124/160]
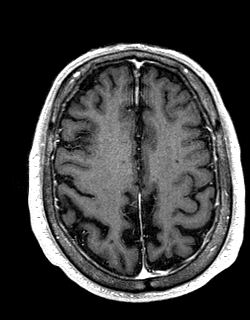
[im 142/160]
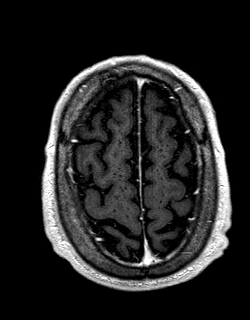
[im 160/160]
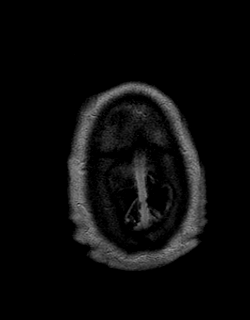

[48 of 48 positions shown; findings below may reference images not displayed]

FINDINGS: Brain: Cerebral volume is within normal limits for age. No
restricted diffusion to suggest acute infarction. No midline shift,
mass effect, evidence of mass lesion, ventriculomegaly, extra-axial
collection or acute intracranial hemorrhage. Cervicomedullary
junction and pituitary are within normal limits.

No cortical encephalomalacia or chronic cerebral blood products are
identified. Scattered minimal to mild for age nonspecific small
cerebral white matter foci of T2 and FLAIR hyperintensity.
Incidental increased perivascular spaces in both inferior basal
ganglia and the the superior frontal lobe subcortical white matter.
Deep gray matter nuclei, brainstem, and cerebellum appear normal for
age. No abnormal enhancement identified. No dural thickening.

Vascular: Major intracranial vascular flow voids are preserved.

Skull and upper cervical spine: Negative. Visualized bone marrow
signal is within normal limits.

Sinuses/Orbits: Postoperative changes to both globes, otherwise
normal orbits soft tissues. The cavernous sinus appears normal.

Mild paranasal sinus mucosal thickening, similar to the 8238 CT.

Other: Visible internal auditory structures appear normal. Mastoids
are clear. Negative scalp soft tissues.
IMPRESSION: 1. No acute intracranial abnormality and essentially normal for age
MRI appearance of the brain.
2. Minor paranasal sinus inflammation, similar to the 8238 CT.

## 2018-11-02 ENCOUNTER — Other Ambulatory Visit: Payer: Self-pay | Admitting: Adult Health

## 2018-11-05 NOTE — Telephone Encounter (Signed)
Sent to the pharmacy by e-scribe. 

## 2018-11-06 ENCOUNTER — Other Ambulatory Visit: Payer: Self-pay | Admitting: Adult Health

## 2018-11-06 NOTE — Telephone Encounter (Signed)
DENIED.  FILLED ON 11/05/2018 FOR 1 YEAR.

## 2018-11-21 ENCOUNTER — Encounter: Payer: Self-pay | Admitting: Hematology & Oncology

## 2018-11-25 ENCOUNTER — Inpatient Hospital Stay: Payer: Medicare HMO

## 2018-11-25 ENCOUNTER — Encounter: Payer: Self-pay | Admitting: Hematology & Oncology

## 2018-11-25 ENCOUNTER — Inpatient Hospital Stay: Payer: Medicare HMO | Attending: Hematology & Oncology | Admitting: Hematology & Oncology

## 2018-11-25 ENCOUNTER — Other Ambulatory Visit: Payer: Self-pay

## 2018-11-25 VITALS — BP 138/78 | HR 57 | Temp 97.8°F | Resp 18 | Wt 184.0 lb

## 2018-11-25 DIAGNOSIS — Z7982 Long term (current) use of aspirin: Secondary | ICD-10-CM | POA: Insufficient documentation

## 2018-11-25 DIAGNOSIS — D45 Polycythemia vera: Secondary | ICD-10-CM

## 2018-11-25 LAB — CMP (CANCER CENTER ONLY)
ALT: 13 U/L (ref 0–44)
AST: 15 U/L (ref 15–41)
Albumin: 4.4 g/dL (ref 3.5–5.0)
Alkaline Phosphatase: 62 U/L (ref 38–126)
Anion gap: 7 (ref 5–15)
BUN: 26 mg/dL — ABNORMAL HIGH (ref 8–23)
CO2: 27 mmol/L (ref 22–32)
Calcium: 9.5 mg/dL (ref 8.9–10.3)
Chloride: 106 mmol/L (ref 98–111)
Creatinine: 1.4 mg/dL — ABNORMAL HIGH (ref 0.61–1.24)
GFR, Est AFR Am: 55 mL/min — ABNORMAL LOW (ref >60)
GFR, Estimated: 48 mL/min — ABNORMAL LOW (ref >60)
Glucose, Bld: 129 mg/dL — ABNORMAL HIGH (ref 70–99)
Potassium: 4.6 mmol/L (ref 3.5–5.1)
Sodium: 140 mmol/L (ref 135–145)
Total Bilirubin: 0.7 mg/dL (ref 0.3–1.2)
Total Protein: 7.3 g/dL (ref 6.5–8.1)

## 2018-11-25 LAB — RETICULOCYTES
Immature Retic Fract: 5.4 % (ref 2.3–15.9)
RBC.: 4.77 MIL/uL (ref 4.22–5.81)
Retic Count, Absolute: 38.6 10*3/uL (ref 19.0–186.0)
Retic Ct Pct: 0.8 % (ref 0.4–3.1)

## 2018-11-25 LAB — CBC WITH DIFFERENTIAL (CANCER CENTER ONLY)
Abs Immature Granulocytes: 0.03 10*3/uL (ref 0.00–0.07)
Basophils Absolute: 0.1 10*3/uL (ref 0.0–0.1)
Basophils Relative: 1 %
Eosinophils Absolute: 0.7 10*3/uL — ABNORMAL HIGH (ref 0.0–0.5)
Eosinophils Relative: 8 %
HCT: 42.2 % (ref 39.0–52.0)
Hemoglobin: 13.9 g/dL (ref 13.0–17.0)
Immature Granulocytes: 0 %
Lymphocytes Relative: 24 %
Lymphs Abs: 2.2 10*3/uL (ref 0.7–4.0)
MCH: 29.4 pg (ref 26.0–34.0)
MCHC: 32.9 g/dL (ref 30.0–36.0)
MCV: 89.4 fL (ref 80.0–100.0)
Monocytes Absolute: 1.2 10*3/uL — ABNORMAL HIGH (ref 0.1–1.0)
Monocytes Relative: 13 %
Neutro Abs: 4.8 10*3/uL (ref 1.7–7.7)
Neutrophils Relative %: 54 %
Platelet Count: 239 10*3/uL (ref 150–400)
RBC: 4.72 MIL/uL (ref 4.22–5.81)
RDW: 14.5 % (ref 11.5–15.5)
WBC Count: 8.9 10*3/uL (ref 4.0–10.5)
nRBC: 0 % (ref 0.0–0.2)

## 2018-11-25 LAB — IRON AND TIBC
Iron: 115 ug/dL (ref 42–163)
Saturation Ratios: 34 % (ref 20–55)
TIBC: 338 ug/dL (ref 202–409)
UIBC: 222 ug/dL (ref 117–376)

## 2018-11-25 LAB — FERRITIN: Ferritin: 29 ng/mL (ref 24–336)

## 2018-11-25 NOTE — Progress Notes (Signed)
Hematology and Oncology Follow Up Visit  Alexander [name] [id number] Dec 10, 1940 78 y.o. 11/25/2018   Principle Diagnosis:  Polycythemia vera-JAK2 negative  Current Therapy:    Phlebotomy to maintain hematocrit below 45% - 12/29/2015  Aspirin 81 mg by mouth daily     Interim History:  Mr.  Alexander [name] is in for a followup.  He is doing quite well.  As expected, he and his wife are being very careful.  The be very cautious with the coronavirus.  They really only go to see one neighbor.  Other times, they stay inside.  He has been doing quite well.  He has had no problems with his heart.  There has been no chest wall pain.  He has had no cough or shortness of breath.  There is no change in bowel or bladder habits.  His wife is doing okay result.  She is still recovering from her breast cancer treatment.  Only last saw him back in August, his ferritin was 28 with an iron saturation of 11%.  Overall, his performance status is [name].  Medications:  Current Outpatient Medications:  .  ACCU-CHEK SOFTCLIX LANCETS lancets, Use to test blood glucose once daily, Disp: 100 each, Rfl: 3 .  allopurinol (ZYLOPRIM) 100 MG tablet, Take 1 tablet by mouth once daily, Disp: 90 tablet, Rfl: 3 .  amLODipine (NORVASC) 10 MG tablet, Take 1 tablet by mouth once daily, Disp: 90 tablet, Rfl: 3 .  aspirin EC 81 MG tablet, Take 1 tablet (81 mg total) by mouth daily., Disp: 90 tablet, Rfl: 3 .  Blood Glucose Monitoring Suppl (ACCU-CHEK AVIVA PLUS) w/Device KIT, Use to test blood glucose once daily, Disp: 1 kit, Rfl: 0 .  Coenzyme Q10 300 MG CAPS, Take 1 capsule by mouth daily. , Disp: , Rfl:  .  glucose blood (ACCU-CHEK AVIVA) test strip, Use to test blood glucose once daily, Disp: 100 each, Rfl: 3 .  lisinopril (ZESTRIL) 10 MG tablet, Take 1 tablet by mouth once daily, Disp: 90 tablet, Rfl: 3 .  pantoprazole (PROTONIX) 40 MG tablet, Take 1 tablet by mouth once daily, Disp: 90 tablet, Rfl: 3 .  pravastatin  (PRAVACHOL) 40 MG tablet, Take 1 tablet by mouth once daily, Disp: 90 tablet, Rfl: 3 .  PREVIDENT 5000 ENAMEL PROTECT 1.1-5 % PSTE, See admin instructions., Disp: , Rfl:  .  PROCTO-MED HC 2.5 % rectal cream, APPLY 1 CREAM RECTALLY TWICE DAILY, Disp: 30 g, Rfl: 0  Allergies:  Allergies  Allergen Reactions  . Metformin And Related Diarrhea    Past Medical History, Surgical history, Social history, and Family History were reviewed and updated.  Review of Systems: Review of Systems  Constitutional: Negative.   HENT: Negative.   Eyes: Negative.   Respiratory: Negative.   Cardiovascular: Negative.   Gastrointestinal: Negative.   Genitourinary: Negative.   Musculoskeletal: Negative.   Skin: Negative.   Neurological: Negative.   Endo/Heme/Allergies: Negative.   Psychiatric/Behavioral: Negative.     Physical Exam:  weight is 184 lb (83.5 kg). His temporal temperature is 97.8 F (36.6 C). His blood pressure is 138/78 and his pulse is 57 (abnormal). His respiration is 18 and oxygen saturation is 100%.   Physical Exam Vitals signs reviewed.  HENT:     Head: Normocephalic and atraumatic.  Eyes:     Pupils: Pupils are equal, round, and reactive to light.  Neck:     Musculoskeletal: Normal range of motion.  Cardiovascular:     Rate and  Rhythm: Normal rate and regular rhythm.     Heart sounds: Normal heart sounds.  Pulmonary:     Effort: Pulmonary effort is normal.     Breath sounds: Normal breath sounds.  Abdominal:     General: Bowel sounds are normal.     Palpations: Abdomen is soft.  Musculoskeletal: Normal range of motion.        General: No tenderness or deformity.  Lymphadenopathy:     Cervical: No cervical adenopathy.  Skin:    General: Skin is warm and dry.     Findings: No erythema or rash.  Neurological:     Mental Status: He is alert and oriented to person, place, and time.  Psychiatric:        Behavior: Behavior normal.        Thought Content: Thought content  normal.        Judgment: Judgment normal.      Lab Results  Component Value Date   WBC 8.9 11/25/2018   HGB 13.9 11/25/2018   HCT 42.2 11/25/2018   MCV 89.4 11/25/2018   PLT 239 11/25/2018     Chemistry      Component Value Date/Time   NA 140 11/25/2018 0948   NA 144 11/29/2016 1054   NA 139 12/29/2015 0954   K 4.6 11/25/2018 0948   K 4.2 11/29/2016 1054   K 4.1 12/29/2015 0954   CL 106 11/25/2018 0948   CL 102 11/29/2016 1054   CO2 27 11/25/2018 0948   CO2 30 11/29/2016 1054   CO2 26 12/29/2015 0954   BUN 26 (H) 11/25/2018 0948   BUN 22 11/29/2016 1054   BUN 20.0 12/29/2015 0954   CREATININE 1.40 (H) 11/25/2018 0948   CREATININE 1.2 11/29/2016 1054   CREATININE 1.3 12/29/2015 0954      Component Value Date/Time   CALCIUM 9.5 11/25/2018 0948   CALCIUM 9.3 11/29/2016 1054   CALCIUM 9.5 12/29/2015 0954   ALKPHOS 62 11/25/2018 0948   ALKPHOS 92 (H) 11/29/2016 1054   ALKPHOS 98 12/29/2015 0954   AST 15 11/25/2018 0948   AST 16 12/29/2015 0954   ALT 13 11/25/2018 0948   ALT 20 11/29/2016 1054   ALT 13 12/29/2015 0954   BILITOT 0.7 11/25/2018 0948   BILITOT 0.71 12/29/2015 0954      Impression and Plan: Alexander [name] is 78 year old gentleman. He has polycythemia. He is JAK2 negative however.  For right now, I think we do not have to phlebotomize him.  His hematocrit is doing quite well and holding steady.  We will not get him back in 4 months.  We will get him through all the holidays and most of winter.     Alexander Napoleon, MD 11/23/202010:53 AM

## 2018-12-03 ENCOUNTER — Ambulatory Visit: Payer: Medicare HMO | Admitting: Cardiology

## 2018-12-09 ENCOUNTER — Other Ambulatory Visit: Payer: Self-pay

## 2018-12-09 ENCOUNTER — Encounter: Payer: Self-pay | Admitting: Cardiology

## 2018-12-09 ENCOUNTER — Ambulatory Visit (INDEPENDENT_AMBULATORY_CARE_PROVIDER_SITE_OTHER): Payer: Medicare HMO | Admitting: Cardiology

## 2018-12-09 VITALS — BP 120/72 | HR 58 | Ht 71.0 in | Wt 181.0 lb

## 2018-12-09 DIAGNOSIS — I2510 Atherosclerotic heart disease of native coronary artery without angina pectoris: Secondary | ICD-10-CM

## 2018-12-09 DIAGNOSIS — I10 Essential (primary) hypertension: Secondary | ICD-10-CM

## 2018-12-09 DIAGNOSIS — I2583 Coronary atherosclerosis due to lipid rich plaque: Secondary | ICD-10-CM

## 2018-12-09 DIAGNOSIS — D45 Polycythemia vera: Secondary | ICD-10-CM

## 2018-12-09 NOTE — Patient Instructions (Signed)
Medication Instructions:  The current medical regimen is effective;  continue present plan and medications.  *If you need a refill on your cardiac medications before your next appointment, please call your pharmacy*  Follow-Up: At [facility], you and your health needs are our priority.  As part of our continuing mission to provide you with exceptional heart care, we have created designated Provider Care Teams.  These Care Teams include your primary Cardiologist (physician) and Advanced Practice Providers (APPs -  Physician Assistants and Nurse Practitioners) who all work together to provide you with the care you need, when you need it.  Your next appointment:   12 months  The format for your next appointment:   In Person  Provider:   You may see Mark Skains, MD or one of the following Advanced Practice Providers on your designated Care Team:    Lori Gerhardt, NP  Laura Ingold, NP  Jill McDaniel, NP   Thank you for choosing [facility]!!      

## 2018-12-09 NOTE — Progress Notes (Signed)
Cardiology Office Note:    Date:  12/09/2018   ID:  [name], DOB [birth year], MRN [id number]  PCP:  [name], NP  Cardiologist:  Candee Furbish, MD  Electrophysiologist:  None   Referring MD: [name], NP     History of Present Illness:    Alexander [name] is a 78 y.o. male here for the follow-up of coronary artery disease.  Was 240 pounds when first moved here.   Has polycythemia vera followed by Dr. Marin Olp, hypertension hyperlipidemia prior GI bleed.    Left heart catheterization in 2015 showed diffuse ectasia but no significant obstructive disease.  30% left main plaque.  EF was normal.  Medical management.  Enjoys swimming in his saline pool at home during the summertime.   He travels to [place] almost every year.  His daughter is there.  Has not been there in the last few years.  Has several grandchildren.  Prior event monitor performed showed no atrial fibrillation.  Asymptomatic bradycardia.  He feels he is doing very well.  Swims for 2 hours in the summertime.  They have Bluetooth waterproof headphones.  His main complaint is some tendinitis in his middle finger right hand.  Studies:  - LHC (05/20/13):  Proximal left main 30%, mildly ectatic distal left main, proximal LAD with mild ectasia, proximal circumflex with moderate ectasia, proximal, mid and distal RCA with severe ectasia, EF 55-65%.    - Echo (10/2008):  EF 60-65%, Gr 1 DD  - Nuclear (12/2012):  Normal study, EF 59%  Past Medical History:  Diagnosis Date  . Arthritis    "minor; shoulders primarily" (05/20/2013)  . CAD (coronary artery disease)    a. 05/2013 - Chest pain/unstable angina and dynamic EKG changes showing cath showing atherosclerotic coronary artery disease manifested as diffuse ectasia, normal EF.  . Diverticulosis of colon   . GERD (gastroesophageal reflux disease)   . GI bleed    secondary to Aspirin  . Gout   . Hematuria, microscopic   . Hemorrhoids   .  Hyperglycemia   . Hyperlipidemia   . Hypertension   . Lumbar back pain   . Microalbuminuria   . Overweight(278.02)   . Polycythemia rubra vera ([place])   . Tubular adenoma of colon 07/2012    Past Surgical History:  Procedure Laterality Date  . CARDIAC CATHETERIZATION  05/20/2013  . CATARACT EXTRACTION W/ INTRAOCULAR LENS  IMPLANT, BILATERAL Bilateral 2008  . CYSTECTOMY  ~ 2000   " SEBACEOUS cyst removed from between shoulder blades"  . EXCISIONAL HEMORRHOIDECTOMY  1970's  . INGUINAL HERNIA REPAIR Right ~ 2010  . LEFT HEART CATHETERIZATION WITH CORONARY ANGIOGRAM N/A 05/20/2013   Procedure: LEFT HEART CATHETERIZATION WITH CORONARY ANGIOGRAM;  Surgeon: Peter M Martinique, MD;  Location: [facility] CATH LAB;  Service: Cardiovascular;  Laterality: N/A;  . TONSILLECTOMY AND ADENOIDECTOMY  1940's  . VASECTOMY      Current Medications: Current Meds  Medication Sig  . ACCU-CHEK SOFTCLIX LANCETS lancets Use to test blood glucose once daily  . allopurinol (ZYLOPRIM) 100 MG tablet Take 1 tablet by mouth once daily  . amLODipine (NORVASC) 10 MG tablet Take 1 tablet by mouth once daily  . aspirin EC 81 MG tablet Take 1 tablet (81 mg total) by mouth daily.  . Blood Glucose Monitoring Suppl (ACCU-CHEK AVIVA PLUS) w/Device KIT Use to test blood glucose once daily  . Coenzyme Q10 300 MG CAPS Take 1 capsule by mouth daily.   [name] glucose blood (ACCU-CHEK  AVIVA) test strip Use to test blood glucose once daily  . lisinopril (ZESTRIL) 10 MG tablet Take 1 tablet by mouth once daily  . pantoprazole (PROTONIX) 40 MG tablet Take 1 tablet by mouth once daily  . pravastatin (PRAVACHOL) 40 MG tablet Take 1 tablet by mouth once daily  . PREVIDENT 5000 ENAMEL PROTECT 1.1-5 % PSTE See admin instructions.  [name] PROCTO-MED HC 2.5 % rectal cream APPLY 1 CREAM RECTALLY TWICE DAILY     Allergies:   Metformin and related   Social History   Socioeconomic History  . Marital status: Married    Spouse name: Not on file  . Number of  children: 3  . Years of education: Not on file  . Highest education level: Not on file  Occupational History  . Occupation: retired Lobbyist: RETIRED  Social Needs  . Financial resource strain: Not on file  . Food insecurity    Worry: Not on file    Inability: Not on file  . Transportation needs    Medical: Not on file    Non-medical: Not on file  Tobacco Use  . Smoking status: Never Smoker  . Smokeless tobacco: Never Used  . Tobacco comment: NEVER USED TOBACCO  Substance and Sexual Activity  . Alcohol use: No    Alcohol/week: 0.0 standard drinks  . Drug use: No  . Sexual activity: Yes    Partners: Female  Lifestyle  . Physical activity    Days per week: Not on file    Minutes per session: Not on file  . Stress: Not on file  Relationships  . Social Herbalist on phone: Not on file    Gets together: Not on file    Attends religious service: Not on file    Active member of club or organization: Not on file    Attends meetings of clubs or organizations: Not on file    Relationship status: Not on file  Other Topics Concern  . Not on file  Social History Narrative   Marriedfor 80 years    Daughter, Higher education careers adviser (pediatrician--lives in [place], [place]), one in [place] - Engineer, water. One in [place] - Chief Strategy Officer            Family History: The patient's family history includes Cancer in his maternal uncle; Colon cancer in his mother; Diabetes in his father and mother; Heart disease (age of onset: 27) in his father; Stomach cancer in his maternal grandfather.  ROS:   Please see the history of present illness.     All other systems reviewed and are negative.  EKGs/Labs/Other Studies Reviewed:    The following studies were reviewed today: Prior EKG lab work event monitor.  LDL 87 A1c 6.2 hemoglobin 15.2 creatinine 1.4 potassium 5  EKG:  EKG is ordered today.  The ekg ordered today demonstrates sinus bradycardia rate 57 with no other abnormalities personally  reviewed and interpreted no significant changes.  Recent Labs: 09/27/2018: TSH 0.88 11/25/2018: ALT 13; BUN 26; Creatinine 1.40; Hemoglobin 13.9; Platelet Count 239; Potassium 4.6; Sodium 140  Recent Lipid Panel    Component Value Date/Time   CHOL 168 09/27/2018 0923   TRIG 68.0 09/27/2018 0923   HDL 47.00 09/27/2018 0923   CHOLHDL 4 09/27/2018 0923   VLDL 13.6 09/27/2018 0923   LDLCALC 107 (H) 09/27/2018 0923    Physical Exam:    VS:  BP 120/72   Pulse (!) 58   Ht '5\' 11"'  (1.803 m)  Wt 181 lb (82.1 kg)   SpO2 99%   BMI 25.24 kg/m     Wt Readings from Last 3 Encounters:  12/09/18 181 lb (82.1 kg)  11/25/18 184 lb (83.5 kg)  09/27/18 179 lb 9.6 oz (81.5 kg)     GEN:  Well nourished, well developed in no acute distress HEENT: Normal NECK: No JVD; No carotid bruits LYMPHATICS: No lymphadenopathy CARDIAC: RRR, no murmurs, rubs, gallops RESPIRATORY:  Clear to auscultation without rales, wheezing or rhonchi  ABDOMEN: Soft, non-tender, non-distended MUSCULOSKELETAL:  No edema; No deformity  SKIN: Warm and dry NEUROLOGIC:  Alert and oriented x 3 PSYCHIATRIC:  Normal affect   ASSESSMENT:    1. Coronary artery disease due to lipid rich plaque   2. Polycythemia vera ([place])   3. Essential hypertension    PLAN:    In order of problems listed above:  Coronary artery disease - Mild nonobstructive disease seen on catheterization as above.  Continue with low-dose aspirin, secondary risk factor prevention.  Statin.  Hypertension control.  Stay active.  Doing very well.  Polycythemia rubra vera - Hematology has been following.  Aspirin.  Doing well.  Hypertension/hyperlipidemia - Pravastatin, losartan.  Amlodipine.  Stable.  If his blood pressure gets too low, can always pull back on the amlodipine.  Bradycardia -Staying off of beta-blockers.  Asymptomatic seen on monitor.  One year follow-up.   Medication Adjustments/Labs and Tests Ordered: Current medicines are  reviewed at length with the patient today.  Concerns regarding medicines are outlined above.  Orders Placed This Encounter  Procedures  . EKG 12-Lead   No orders of the defined types were placed in this encounter.   Patient Instructions  Medication Instructions:  The current medical regimen is effective;  continue present plan and medications.  *If you need a refill on your cardiac medications before your next appointment, please call your pharmacy*  Follow-Up: At [facility], you and your health needs are our priority.  As part of our continuing mission to provide you with exceptional heart care, we have created designated Provider Care Teams.  These Care Teams include your primary Cardiologist (physician) and Advanced Practice Providers (APPs -  Physician Assistants and Nurse Practitioners) who all work together to provide you with the care you need, when you need it.  Your next appointment:   12 month(s)  The format for your next appointment:   In Person  Provider:   You may see Candee Furbish, MD or one of the following Advanced Practice Providers on your designated Care Team:    Truitt Merle, NP  Cecilie Kicks, NP  Kathyrn Drown, NP   Thank you for choosing [facility]!!         Signed, Candee Furbish, MD  12/09/2018 2:18 PM    [place]

## 2018-12-30 ENCOUNTER — Encounter: Payer: Self-pay | Admitting: Adult Health

## 2019-01-10 DIAGNOSIS — E118 Type 2 diabetes mellitus with unspecified complications: Secondary | ICD-10-CM | POA: Diagnosis not present

## 2019-01-14 ENCOUNTER — Ambulatory Visit (INDEPENDENT_AMBULATORY_CARE_PROVIDER_SITE_OTHER): Payer: Medicare HMO | Admitting: Adult Health

## 2019-01-14 ENCOUNTER — Other Ambulatory Visit: Payer: Self-pay

## 2019-01-14 ENCOUNTER — Encounter: Payer: Self-pay | Admitting: Adult Health

## 2019-01-14 VITALS — BP 130/80 | HR 67 | Temp 98.0°F | Ht 71.0 in | Wt 182.0 lb

## 2019-01-14 DIAGNOSIS — M5442 Lumbago with sciatica, left side: Secondary | ICD-10-CM | POA: Diagnosis not present

## 2019-01-14 DIAGNOSIS — M5412 Radiculopathy, cervical region: Secondary | ICD-10-CM | POA: Diagnosis not present

## 2019-01-14 MED ORDER — METHYLPREDNISOLONE 4 MG PO TBPK: ORAL_TABLET | ORAL | 0 refills | Status: DC

## 2019-01-14 NOTE — Telephone Encounter (Signed)
Please advise 

## 2019-01-14 NOTE — Progress Notes (Signed)
Subjective:    Patient ID: Alexander [name], male    DOB: [birth year], 79 y.o.   MRN: [id number]  HPI 78 year old male who  has a past medical history of Arthritis, CAD (coronary artery disease), Diverticulosis of colon, GERD (gastroesophageal reflux disease), GI bleed, Gout, Hematuria, microscopic, Hemorrhoids, Hyperglycemia, Hyperlipidemia, Hypertension, Lumbar back pain, Microalbuminuria, Overweight(278.02), Polycythemia rubra vera ([place]), and Tubular adenoma of colon (07/2012).  He presents to the office today for an acute visit.  Symptoms have been present for a while, likely many months and started after he stopped exercising ( swimming). Symptoms have started to become slightly worse.  Symptoms include right sided upper back pain with radiating numbness and tingling down the right arm as well as left-sided low back pain with radiating pain down the outside of his left leg.  Pain is worse with certain movements.  Denies loss of grip strength, decreased range of motion, or issues with ADLs.  Review of Systems See HPI   Past Medical History:  Diagnosis Date  . Arthritis    "minor; shoulders primarily" (05/20/2013)  . CAD (coronary artery disease)    a. 05/2013 - Chest pain/unstable angina and dynamic EKG changes showing cath showing atherosclerotic coronary artery disease manifested as diffuse ectasia, normal EF.  . Diverticulosis of colon   . GERD (gastroesophageal reflux disease)   . GI bleed    secondary to Aspirin  . Gout   . Hematuria, microscopic   . Hemorrhoids   . Hyperglycemia   . Hyperlipidemia   . Hypertension   . Lumbar back pain   . Microalbuminuria   . Overweight(278.02)   . Polycythemia rubra vera ([place])   . Tubular adenoma of colon 07/2012    Social History   Socioeconomic History  . Marital status: Married    Spouse name: Not on file  . Number of children: 3  . Years of education: Not on file  . Highest education level: Not on file  Occupational History    . Occupation: retired Lobbyist: RETIRED  Tobacco Use  . Smoking status: Never Smoker  . Smokeless tobacco: Never Used  . Tobacco comment: NEVER USED TOBACCO  Substance and Sexual Activity  . Alcohol use: No    Alcohol/week: 0.0 standard drinks  . Drug use: No  . Sexual activity: Yes    Partners: Female  Other Topics Concern  . Not on file  Social History Narrative   Marriedfor 77 years    Daughter, Higher education careers adviser (pediatrician--lives in [place], [place]), one in [place] - Engineer, water. One in [place] - Chief Strategy Officer          Social Determinants of Health   Financial Resource Strain:   . Difficulty of Paying Living Expenses: Not on file  Food Insecurity:   . Worried About Charity fundraiser in the Last Year: Not on file  . Ran Out of Food in the Last Year: Not on file  Transportation Needs:   . Lack of Transportation (Medical): Not on file  . Lack of Transportation (Non-Medical): Not on file  Physical Activity:   . Days of Exercise per Week: Not on file  . Minutes of Exercise per Session: Not on file  Stress:   . Feeling of Stress : Not on file  Social Connections:   . Frequency of Communication with Friends and Family: Not on file  . Frequency of Social Gatherings with Friends and Family: Not on file  . Attends Religious Services: Not  on file  . Active Member of Clubs or Organizations: Not on file  . Attends Archivist Meetings: Not on file  . Marital Status: Not on file  Intimate Partner Violence:   . Fear of Current or Ex-Partner: Not on file  . Emotionally Abused: Not on file  . Physically Abused: Not on file  . Sexually Abused: Not on file    Past Surgical History:  Procedure Laterality Date  . CARDIAC CATHETERIZATION  05/20/2013  . CATARACT EXTRACTION W/ INTRAOCULAR LENS  IMPLANT, BILATERAL Bilateral 2008  . CYSTECTOMY  ~ 2000   " SEBACEOUS cyst removed from between shoulder blades"  . EXCISIONAL HEMORRHOIDECTOMY  1970's  . INGUINAL HERNIA REPAIR Right ~  2010  . LEFT HEART CATHETERIZATION WITH CORONARY ANGIOGRAM N/A 05/20/2013   Procedure: LEFT HEART CATHETERIZATION WITH CORONARY ANGIOGRAM;  Surgeon: Peter M Martinique, MD;  Location: [facility] CATH LAB;  Service: Cardiovascular;  Laterality: N/A;  . TONSILLECTOMY AND ADENOIDECTOMY  1940's  . VASECTOMY      Family History  Problem Relation Age of Onset  . Colon cancer Mother        died at 14, colorectal  . Diabetes Mother   . Heart disease Father 18       MI  . Diabetes Father   . Stomach cancer Maternal Grandfather   . Cancer Maternal Uncle     Allergies  Allergen Reactions  . Metformin And Related Diarrhea    Current Outpatient Medications on File Prior to Visit  Medication Sig Dispense Refill  . ACCU-CHEK SOFTCLIX LANCETS lancets Use to test blood glucose once daily 100 each 3  . allopurinol (ZYLOPRIM) 100 MG tablet Take 1 tablet by mouth once daily 90 tablet 3  . amLODipine (NORVASC) 10 MG tablet Take 1 tablet by mouth once daily 90 tablet 3  . aspirin EC 81 MG tablet Take 1 tablet (81 mg total) by mouth daily. 90 tablet 3  . Blood Glucose Monitoring Suppl (ACCU-CHEK AVIVA PLUS) w/Device KIT Use to test blood glucose once daily 1 kit 0  . Coenzyme Q10 300 MG CAPS Take 1 capsule by mouth daily.     [name] glucose blood (ACCU-CHEK AVIVA) test strip Use to test blood glucose once daily 100 each 3  . lisinopril (ZESTRIL) 10 MG tablet Take 1 tablet by mouth once daily 90 tablet 3  . pantoprazole (PROTONIX) 40 MG tablet Take 1 tablet by mouth once daily 90 tablet 3  . pravastatin (PRAVACHOL) 40 MG tablet Take 1 tablet by mouth once daily 90 tablet 3  . PREVIDENT 5000 ENAMEL PROTECT 1.1-5 % PSTE See admin instructions.    [name] PROCTO-MED HC 2.5 % rectal cream APPLY 1 CREAM RECTALLY TWICE DAILY 30 g 0   No current facility-administered medications on file prior to visit.    BP 130/80   Pulse 67   Temp 98 F (36.7 C) (Other (Comment))   Ht _0  (1.803 m)   Wt 182 lb (82.6 kg)   SpO2 98%    BMI 25.38 kg/m       Objective:   Physical Exam Vitals and nursing note reviewed.  Constitutional:      Appearance: Normal appearance.  Musculoskeletal:        General: No tenderness. Normal range of motion.  Skin:    General: Skin is warm and dry.     Capillary Refill: Capillary refill takes less than 2 seconds.  Neurological:     General: No focal deficit  present.     Mental Status: He is alert and oriented to person, place, and time.     Cranial Nerves: No cranial nerve deficit.     Sensory: No sensory deficit.     Motor: No weakness.     Coordination: Coordination normal.     Gait: Gait normal.     Deep Tendon Reflexes: Reflexes normal.  Psychiatric:        Mood and Affect: Mood normal.        Behavior: Behavior normal.        Thought Content: Thought content normal.        Judgment: Judgment normal.       Assessment & Plan:  1. Cervical radiculopathy  - methylPREDNISolone (MEDROL DOSEPAK) 4 MG TBPK tablet; Take as directed  Dispense: 21 tablet; Refill: 0  2. Acute left-sided low back pain with left-sided sciatica - Advised gentle stretching exercises  - Follow up if no improvement after medrol dose pack  - methylPREDNISolone (MEDROL DOSEPAK) 4 MG TBPK tablet; Take as directed  Dispense: 21 tablet; Refill: 0   [name], NP

## 2019-01-27 ENCOUNTER — Encounter: Payer: Self-pay | Admitting: Hematology & Oncology

## 2019-02-09 ENCOUNTER — Ambulatory Visit: Payer: Medicare HMO | Attending: Internal Medicine

## 2019-02-09 ENCOUNTER — Ambulatory Visit: Payer: Medicare HMO

## 2019-02-09 DIAGNOSIS — Z23 Encounter for immunization: Secondary | ICD-10-CM | POA: Insufficient documentation

## 2019-02-09 NOTE — Progress Notes (Signed)
   Covid-19 Vaccination Clinic  Name:  Alexander [name]    MRN: [id number] DOB: Jul 02, 1940  02/09/2019  Alexander [name] was observed post Covid-19 immunization for 15 minutes without incidence. He was provided with Vaccine Information Sheet and instruction to access the V-Safe system.   Alexander [name] was instructed to call 911 with any severe reactions post vaccine: [name] Difficulty breathing  . Swelling of your face and throat  . A fast heartbeat  . A bad rash all over your body  . Dizziness and weakness    Immunizations Administered    Name Date Dose VIS Date Route   Pfizer COVID-19 Vaccine 02/09/2019  4:08 PM 0.3 mL 12/13/2018 Intramuscular   Manufacturer: [employer]   Lot: [id number]   [place]: [id number]

## 2019-03-06 ENCOUNTER — Ambulatory Visit: Payer: Medicare HMO | Attending: Internal Medicine

## 2019-03-06 DIAGNOSIS — Z23 Encounter for immunization: Secondary | ICD-10-CM | POA: Insufficient documentation

## 2019-03-06 NOTE — Progress Notes (Signed)
   Covid-19 Vaccination Clinic  Name:  Alexander [name]    MRN: [id number] DOB: February 08, 1940  03/06/2019  [name] was observed post Covid-19 immunization for 15 minutes without incident. He was provided with Vaccine Information Sheet and instruction to access the V-Safe system.   [name] was instructed to call 911 with any severe reactions post vaccine: [name] Difficulty breathing  . Swelling of face and throat  . A fast heartbeat  . A bad rash all over body  . Dizziness and weakness   Immunizations Administered    Name Date Dose VIS Date Route   Pfizer COVID-19 Vaccine 03/06/2019  1:02 PM 0.3 mL 12/13/2018 Intramuscular   Manufacturer: [place]   Lot: [id number]   [place]: [id number]

## 2019-03-26 ENCOUNTER — Other Ambulatory Visit: Payer: Self-pay

## 2019-03-26 ENCOUNTER — Inpatient Hospital Stay: Payer: Medicare HMO

## 2019-03-26 ENCOUNTER — Inpatient Hospital Stay: Payer: Medicare HMO | Attending: Hematology & Oncology | Admitting: Family

## 2019-03-26 ENCOUNTER — Encounter: Payer: Self-pay | Admitting: Hematology & Oncology

## 2019-03-26 VITALS — BP 127/67 | HR 64 | Temp 96.9°F | Resp 18 | Wt 183.2 lb

## 2019-03-26 DIAGNOSIS — D751 Secondary polycythemia: Secondary | ICD-10-CM | POA: Insufficient documentation

## 2019-03-26 DIAGNOSIS — D45 Polycythemia vera: Secondary | ICD-10-CM

## 2019-03-26 DIAGNOSIS — D500 Iron deficiency anemia secondary to blood loss (chronic): Secondary | ICD-10-CM | POA: Diagnosis not present

## 2019-03-26 LAB — CBC WITH DIFFERENTIAL (CANCER CENTER ONLY)
Abs Immature Granulocytes: 0.04 10*3/uL (ref 0.00–0.07)
Basophils Absolute: 0.1 10*3/uL (ref 0.0–0.1)
Basophils Relative: 1 %
Eosinophils Absolute: 0.3 10*3/uL (ref 0.0–0.5)
Eosinophils Relative: 3 %
HCT: 44.2 % (ref 39.0–52.0)
Hemoglobin: 14.5 g/dL (ref 13.0–17.0)
Immature Granulocytes: 1 %
Lymphocytes Relative: 21 %
Lymphs Abs: 1.8 10*3/uL (ref 0.7–4.0)
MCH: 30.1 pg (ref 26.0–34.0)
MCHC: 32.8 g/dL (ref 30.0–36.0)
MCV: 91.7 fL (ref 80.0–100.0)
Monocytes Absolute: 1.2 10*3/uL — ABNORMAL HIGH (ref 0.1–1.0)
Monocytes Relative: 14 %
Neutro Abs: 5.3 10*3/uL (ref 1.7–7.7)
Neutrophils Relative %: 60 %
Platelet Count: 230 10*3/uL (ref 150–400)
RBC: 4.82 MIL/uL (ref 4.22–5.81)
RDW: 15 % (ref 11.5–15.5)
WBC Count: 8.7 10*3/uL (ref 4.0–10.5)
nRBC: 0 % (ref 0.0–0.2)

## 2019-03-26 LAB — CMP (CANCER CENTER ONLY)
ALT: 13 U/L (ref 0–44)
AST: 13 U/L — ABNORMAL LOW (ref 15–41)
Albumin: 4.3 g/dL (ref 3.5–5.0)
Alkaline Phosphatase: 75 U/L (ref 38–126)
Anion gap: 6 (ref 5–15)
BUN: 25 mg/dL — ABNORMAL HIGH (ref 8–23)
CO2: 28 mmol/L (ref 22–32)
Calcium: 9.5 mg/dL (ref 8.9–10.3)
Chloride: 105 mmol/L (ref 98–111)
Creatinine: 1.21 mg/dL (ref 0.61–1.24)
GFR, Est AFR Am: >60 mL/min (ref >60)
GFR, Estimated: 57 mL/min — ABNORMAL LOW (ref >60)
Glucose, Bld: 123 mg/dL — ABNORMAL HIGH (ref 70–99)
Potassium: 4.4 mmol/L (ref 3.5–5.1)
Sodium: 139 mmol/L (ref 135–145)
Total Bilirubin: 0.5 mg/dL (ref 0.3–1.2)
Total Protein: 7 g/dL (ref 6.5–8.1)

## 2019-03-26 NOTE — Progress Notes (Signed)
Hematology and Oncology Follow Up Visit  Alexander [name] [id number] 02-Sep-1940 79 y.o. 03/26/2019   Principle Diagnosis:  Secondary polycythemia vera - JAK2 negative  Current Therapy:  Phlebotomy to maintain hematocrit below 45% - Will donate with [employer] as needed Aspirin 81 mg by mouth daily   Interim History:  Alexander [name] is here today for follow-up. He is doing well and has no complaints at this time.  His Hct today is stable at 44.2% so no phlebotomy needed.  He denies any bleeding. No bruising or petechiae.  No fever, chills, n/v, cough, rash, dizziness, SOB, chest pain, palpitations, abdominal pain or changes in bowel or bladder habits.  No swelling, tenderness, numbness or tingling in his extremities.  He is quite active and stays busy.  No falls or syncope.  He is eating well and staying hydrated. His weight is stable.   ECOG Performance Status: 0 - Asymptomatic  Medications:  Allergies as of 03/26/2019      Reactions   Metformin And Related Diarrhea      Medication List       Accurate as of March 26, 2019 12:31 PM. If you have any questions, ask your nurse or doctor.        Accu-Chek Aviva Plus w/Device Kit Use to test blood glucose once daily   Accu-Chek Softclix Lancets lancets Use to test blood glucose once daily   allopurinol 100 MG tablet Commonly known as: ZYLOPRIM Take 1 tablet by mouth once daily   amLODipine 10 MG tablet Commonly known as: NORVASC Take 1 tablet by mouth once daily   aspirin EC 81 MG tablet Take 1 tablet (81 mg total) by mouth daily.   Coenzyme Q10 300 MG Caps Take 1 capsule by mouth daily.   glucose blood test strip Commonly known as: Accu-Chek Aviva Use to test blood glucose once daily   lisinopril 10 MG tablet Commonly known as: ZESTRIL Take 1 tablet by mouth once daily   methylPREDNISolone 4 MG Tbpk tablet Commonly known as: MEDROL DOSEPAK Take as directed   pantoprazole 40 MG tablet Commonly known as:  PROTONIX Take 1 tablet by mouth once daily   pravastatin 40 MG tablet Commonly known as: PRAVACHOL Take 1 tablet by mouth once daily   PreviDent 5000 Enamel Protect 1.1-5 % Pste Generic drug: Sod Fluoride-Potassium Nitrate See admin instructions.   Procto-Med HC 2.5 % rectal cream Generic drug: hydrocortisone APPLY 1 CREAM RECTALLY TWICE DAILY       Allergies:  Allergies  Allergen Reactions  . Metformin And Related Diarrhea    Past Medical History, Surgical history, Social history, and Family History were reviewed and updated.  Review of Systems: All other 10 point review of systems is negative.   Physical Exam:  weight is 183 lb 4 oz (83.1 kg). His temporal temperature is 96.9 F (36.1 C) (abnormal). His blood pressure is 127/67 and his pulse is 64. His respiration is 18 and oxygen saturation is 100%.   Wt Readings from Last 3 Encounters:  03/26/19 183 lb 4 oz (83.1 kg)  01/14/19 182 lb (82.6 kg)  12/09/18 181 lb (82.1 kg)    Ocular: Sclerae unicteric, pupils equal, round and reactive to light Ear-nose-throat: Oropharynx clear, dentition fair Lymphatic: No cervical or supraclavicular adenopathy Lungs no rales or rhonchi, good excursion bilaterally Heart regular rate and rhythm, no murmur appreciated Abd soft, nontender, positive bowel sounds, no liver or spleen tip palpated on exam, no fluid wave  MSK no focal spinal  tenderness, no joint edema Neuro: non-focal, well-oriented, appropriate affect Breasts: Deferred   Lab Results  Component Value Date   WBC 8.7 03/26/2019   HGB 14.5 03/26/2019   HCT 44.2 03/26/2019   MCV 91.7 03/26/2019   PLT 230 03/26/2019   Lab Results  Component Value Date   FERRITIN 29 11/25/2018   IRON 115 11/25/2018   TIBC 338 11/25/2018   UIBC 222 11/25/2018   IRONPCTSAT 34 11/25/2018   Lab Results  Component Value Date   RETICCTPCT 0.8 11/25/2018   RBC 4.82 03/26/2019   RETICCTABS 32.6 12/19/2013   No results found for:  KPAFRELGTCHN, LAMBDASER, KAPLAMBRATIO No results found for: IGGSERUM, IGA, IGMSERUM No results found for: Kathrynn Ducking, MSPIKE, SPEI   Chemistry      Component Value Date/Time   NA 140 11/25/2018 0948   NA 144 11/29/2016 1054   NA 139 12/29/2015 0954   K 4.6 11/25/2018 0948   K 4.2 11/29/2016 1054   K 4.1 12/29/2015 0954   CL 106 11/25/2018 0948   CL 102 11/29/2016 1054   CO2 27 11/25/2018 0948   CO2 30 11/29/2016 1054   CO2 26 12/29/2015 0954   BUN 26 (H) 11/25/2018 0948   BUN 22 11/29/2016 1054   BUN 20.0 12/29/2015 0954   CREATININE 1.40 (H) 11/25/2018 0948   CREATININE 1.2 11/29/2016 1054   CREATININE 1.3 12/29/2015 0954      Component Value Date/Time   CALCIUM 9.5 11/25/2018 0948   CALCIUM 9.3 11/29/2016 1054   CALCIUM 9.5 12/29/2015 0954   ALKPHOS 62 11/25/2018 0948   ALKPHOS 92 (H) 11/29/2016 1054   ALKPHOS 98 12/29/2015 0954   AST 15 11/25/2018 0948   AST 16 12/29/2015 0954   ALT 13 11/25/2018 0948   ALT 20 11/29/2016 1054   ALT 13 12/29/2015 0954   BILITOT 0.7 11/25/2018 0948   BILITOT 0.71 12/29/2015 0954       Impression and Plan: Alexander [name] is a very pleasant 78 yo caucasian gentleman with history of secondary polycythemia, JAK 2 negative.  He is doing well and Hct is stable at 44.2%.  He would like to donate blood when he needs to have phlebotomy so he can benefit someone else. This will be fine and I gave him the info for [facility]. He is quite excited about this opportunity.  We will plan to see him again in another 4 months.  He will contact our office with any questions or concerns. We can certainly see him sooner if needed.   Laverna Peace, NP 3/24/202112:31 PM

## 2019-03-27 ENCOUNTER — Encounter: Payer: Self-pay | Admitting: Adult Health

## 2019-03-27 LAB — IRON AND TIBC
Iron: 54 ug/dL (ref 42–163)
Saturation Ratios: 16 % — ABNORMAL LOW (ref 20–55)
TIBC: 340 ug/dL (ref 202–409)
UIBC: 286 ug/dL (ref 117–376)

## 2019-03-27 LAB — FERRITIN: Ferritin: 33 ng/mL (ref 24–336)

## 2019-03-27 MED ORDER — AMLODIPINE BESYLATE 10 MG PO TABS: 10.0000 mg | ORAL_TABLET | Freq: Every day | ORAL | 3 refills | Status: DC

## 2019-03-27 MED ORDER — PROCTO-MED HC 2.5 % EX CREA: TOPICAL_CREAM | CUTANEOUS | 3 refills | Status: DC

## 2019-03-27 MED ORDER — PANTOPRAZOLE SODIUM 40 MG PO TBEC: 40.0000 mg | DELAYED_RELEASE_TABLET | Freq: Every day | ORAL | 0 refills | Status: DC

## 2019-03-27 MED ORDER — ALLOPURINOL 100 MG PO TABS: 100.0000 mg | ORAL_TABLET | Freq: Every day | ORAL | 0 refills | Status: DC

## 2019-03-27 MED ORDER — LISINOPRIL 10 MG PO TABS: 10.0000 mg | ORAL_TABLET | Freq: Every day | ORAL | 3 refills | Status: DC

## 2019-03-27 NOTE — Telephone Encounter (Signed)
Ok to fill all for 1 year.  Last cpx 09/2018

## 2019-03-27 NOTE — Telephone Encounter (Signed)
That is fine 

## 2019-03-31 ENCOUNTER — Encounter: Payer: Self-pay | Admitting: Adult Health

## 2019-04-02 ENCOUNTER — Encounter: Payer: Self-pay | Admitting: Adult Health

## 2019-04-04 ENCOUNTER — Other Ambulatory Visit: Payer: Self-pay | Admitting: Family

## 2019-04-13 ENCOUNTER — Encounter: Payer: Self-pay | Admitting: Adult Health

## 2019-04-15 MED ORDER — PRAVASTATIN SODIUM 40 MG PO TABS: 40.0000 mg | ORAL_TABLET | Freq: Every day | ORAL | 0 refills | Status: DC

## 2019-04-16 ENCOUNTER — Encounter: Payer: Self-pay | Admitting: Adult Health

## 2019-04-16 ENCOUNTER — Ambulatory Visit (INDEPENDENT_AMBULATORY_CARE_PROVIDER_SITE_OTHER): Payer: Medicare HMO | Admitting: Adult Health

## 2019-04-16 ENCOUNTER — Other Ambulatory Visit: Payer: Self-pay

## 2019-04-16 VITALS — BP 128/80 | Temp 97.8°F | Wt 181.0 lb

## 2019-04-16 DIAGNOSIS — M25511 Pain in right shoulder: Secondary | ICD-10-CM | POA: Diagnosis not present

## 2019-04-16 MED ORDER — METHYLPREDNISOLONE ACETATE 80 MG/ML IJ SUSP
80.0000 mg | Freq: Once | INTRAMUSCULAR | Status: AC
  Administered 2019-04-16: 80 mg via INTRAMUSCULAR

## 2019-04-16 NOTE — Progress Notes (Addendum)
Subjective:    Patient ID: Alexander [name], male    DOB: 10-19-1940, 79 y.o.   MRN: [id number]  HPI 79 year old male who  has a past medical history of Arthritis, CAD (coronary artery disease), Diverticulosis of colon, GERD (gastroesophageal reflux disease), GI bleed, Gout, Hematuria, microscopic, Hemorrhoids, Hyperglycemia, Hyperlipidemia, Hypertension, Lumbar back pain, Microalbuminuria, Overweight(278.02), Polycythemia rubra vera ([place]), and Tubular adenoma of colon (07/2012).   He is being evaluated today for an acute issue.  His symptoms have been present on and off for multiple months.  He was last seen for this issue on January 14, 2019.  His symptoms include right side upper back pain.  [place]li no numbness or tingling going down his right arm.  Reports pain is worse some days, yesterday his pain was severe today his pain is much more tolerable.  He has full range of motion of his right shoulder.  He feels as though the pain is more muscular rather than skeletal.  When he was treated in January with oral prednisone he does report that this worked well until the last week when his pain came back    Review of Systems See HPI   Past Medical History:  Diagnosis Date  . Arthritis    "minor; shoulders primarily" (05/20/2013)  . CAD (coronary artery disease)    a. 05/2013 - Chest pain/unstable angina and dynamic EKG changes showing cath showing atherosclerotic coronary artery disease manifested as diffuse ectasia, normal EF.  . Diverticulosis of colon   . GERD (gastroesophageal reflux disease)   . GI bleed    secondary to Aspirin  . Gout   . Hematuria, microscopic   . Hemorrhoids   . Hyperglycemia   . Hyperlipidemia   . Hypertension   . Lumbar back pain   . Microalbuminuria   . Overweight(278.02)   . Polycythemia rubra vera ([place])   . Tubular adenoma of colon 07/2012    Social History   Socioeconomic History  . Marital status: Married    Spouse name: Not on file  . Number  of children: 3  . Years of education: Not on file  . Highest education level: Not on file  Occupational History  . Occupation: retired Lobbyist: RETIRED  Tobacco Use  . Smoking status: Never Smoker  . Smokeless tobacco: Never Used  . Tobacco comment: NEVER USED TOBACCO  Substance and Sexual Activity  . Alcohol use: No    Alcohol/week: 0.0 standard drinks  . Drug use: No  . Sexual activity: Yes    Partners: Female  Other Topics Concern  . Not on file  Social History Narrative   Marriedfor 83 years    Daughter, Higher education careers adviser (pediatrician--lives in [place], [place]), one in [place] - Engineer, water. One in [place] - Chief Strategy Officer          Social Determinants of Radio broadcast assistant Strain:   . Difficulty of Paying Living Expenses:   Food Insecurity:   . Worried About Charity fundraiser in the Last Year:   . Arboriculturist in the Last Year:   Transportation Needs:   . Film/video editor (Medical):   [name] Lack of Transportation (Non-Medical):   Physical Activity:   . Days of Exercise per Week:   . Minutes of Exercise per Session:   Stress:   . Feeling of Stress :   Social Connections:   . Frequency of Communication with Friends and Family:   . Frequency of  Social Gatherings with Friends and Family:   . Attends Religious Services:   . Active Member of Clubs or Organizations:   . Attends Archivist Meetings:   [name] Marital Status:   Intimate Partner Violence:   . Fear of Current or Ex-Partner:   . Emotionally Abused:   [name] Physically Abused:   . Sexually Abused:     Past Surgical History:  Procedure Laterality Date  . CARDIAC CATHETERIZATION  05/20/2013  . CATARACT EXTRACTION W/ INTRAOCULAR LENS  IMPLANT, BILATERAL Bilateral 2008  . CYSTECTOMY  ~ 2000   " SEBACEOUS cyst removed from between shoulder blades"  . EXCISIONAL HEMORRHOIDECTOMY  1970's  . INGUINAL HERNIA REPAIR Right ~ 2010  . LEFT HEART CATHETERIZATION WITH CORONARY ANGIOGRAM N/A 05/20/2013    Procedure: LEFT HEART CATHETERIZATION WITH CORONARY ANGIOGRAM;  Surgeon: Peter M Martinique, MD;  Location: [facility] CATH LAB;  Service: Cardiovascular;  Laterality: N/A;  . TONSILLECTOMY AND ADENOIDECTOMY  1940's  . VASECTOMY      Family History  Problem Relation Age of Onset  . Colon cancer Mother        died at 38, colorectal  . Diabetes Mother   . Heart disease Father 52       MI  . Diabetes Father   . Stomach cancer Maternal Grandfather   . Cancer Maternal Uncle     Allergies  Allergen Reactions  . Metformin And Related Diarrhea    Current Outpatient Medications on File Prior to Visit  Medication Sig Dispense Refill  . ACCU-CHEK SOFTCLIX LANCETS lancets Use to test blood glucose once daily 100 each 3  . allopurinol (ZYLOPRIM) 100 MG tablet Take 1 tablet (100 mg total) by mouth daily. 360 tablet 0  . amLODipine (NORVASC) 10 MG tablet Take 1 tablet (10 mg total) by mouth daily. 90 tablet 3  . aspirin EC 81 MG tablet Take 1 tablet (81 mg total) by mouth daily. 90 tablet 3  . Blood Glucose Monitoring Suppl (ACCU-CHEK AVIVA PLUS) w/Device KIT Use to test blood glucose once daily 1 kit 0  . Coenzyme Q10 300 MG CAPS Take 1 capsule by mouth daily.     [name] glucose blood (ACCU-CHEK AVIVA) test strip Use to test blood glucose once daily 100 each 3  . lisinopril (ZESTRIL) 10 MG tablet Take 1 tablet (10 mg total) by mouth daily. 90 tablet 3  . pantoprazole (PROTONIX) 40 MG tablet Take 1 tablet (40 mg total) by mouth daily. 360 tablet 0  . pravastatin (PRAVACHOL) 40 MG tablet Take 1 tablet (40 mg total) by mouth daily. 365 tablet 0  . PREVIDENT 5000 ENAMEL PROTECT 1.1-5 % PSTE See admin instructions.    [name] PROCTO-MED HC 2.5 % rectal cream APPLY 1 CREAM RECTALLY TWICE DAILY 90 g 3   No current facility-administered medications on file prior to visit.    BP 128/80   Temp 97.8 F (36.6 C)   Wt 181 lb (82.1 kg)   BMI 25.24 kg/m       Objective:   Physical Exam Vitals and nursing note  reviewed.  Constitutional:      Appearance: Normal appearance.  Cardiovascular:     Rate and Rhythm: Normal rate and regular rhythm.     Pulses: Normal pulses.     Heart sounds: Normal heart sounds.  Pulmonary:     Effort: Pulmonary effort is normal.     Breath sounds: Normal breath sounds.  Musculoskeletal:        General:  Tenderness present.     Comments: He has tenderness with palpation along the dorsal aspect of the right trapezius.  Areas of the trapezius are very tight. no bony tenderness noted  Skin:    General: Skin is warm and dry.  Neurological:     General: No focal deficit present.     Mental Status: He is alert and oriented to person, place, and time.  Psychiatric:        Mood and Affect: Mood normal.        Behavior: Behavior normal.        Thought Content: Thought content normal.        Judgment: Judgment normal.       Assessment & Plan:  1. Acute pain of right shoulder - Appears to be muscular in nature. We discussed various treatment options including oral steroids, rest, NSAIDS, or trigger point injections. He opted for trigger point injections  - Informed consent was obtained and five trigger point injections were performed at the sites of maximal tenderness throughout right trapezius muscle using a total of 2 ml 1% plain Lidocaine and 80 mg depo medrol mix, 0.5 ml was injected at each trigger point.  This was well tolerated, and followed by immediate relief of pain. - Advised follow up PRN   - methylPREDNISolone acetate (DEPO-MEDROL) injection 80 mg   [name], NP

## 2019-04-17 ENCOUNTER — Encounter: Payer: Self-pay | Admitting: Adult Health

## 2019-05-10 ENCOUNTER — Encounter: Payer: Self-pay | Admitting: Adult Health

## 2019-05-13 ENCOUNTER — Other Ambulatory Visit: Payer: Self-pay | Admitting: Adult Health

## 2019-05-21 ENCOUNTER — Telehealth: Payer: Self-pay | Admitting: Adult Health

## 2019-05-21 NOTE — Chronic Care Management (AMB) (Signed)
  Chronic Care Management   Note  05/21/2019 Name: VICTOR SEAGRAVE MRN: [id number] DOB: 18-Jan-1940  [name] is a 79 y.o. year old male who is a primary care patient of [name], NP. I reached out to [name] by phone today in response to a referral sent by Mr. Kathryne Eriksson PCP, [name], NP.   Mr. Davidheiser was given information about Chronic Care Management services today including:  1. CCM service includes personalized support from designated clinical staff supervised by his physician, including individualized plan of care and coordination with other care providers 2. 24/7 contact phone numbers for assistance for urgent and routine care needs. 3. Service will only be billed when office clinical staff spend 20 minutes or more in a month to coordinate care. 4. Only one practitioner may furnish and bill the service in a calendar month. 5. The patient may stop CCM services at any time (effective at the end of the month) by phone call to the office staff.   Patient agreed to services and verbal consent obtained.   Follow up plan:   [place]

## 2019-06-09 ENCOUNTER — Encounter: Payer: Self-pay | Admitting: Adult Health

## 2019-06-10 ENCOUNTER — Ambulatory Visit (INDEPENDENT_AMBULATORY_CARE_PROVIDER_SITE_OTHER): Payer: Medicare HMO | Admitting: Family Medicine

## 2019-06-10 ENCOUNTER — Encounter: Payer: Self-pay | Admitting: Family Medicine

## 2019-06-10 ENCOUNTER — Other Ambulatory Visit: Payer: Self-pay

## 2019-06-10 VITALS — BP 124/60 | HR 57 | Temp 98.0°F | Wt 178.2 lb

## 2019-06-10 DIAGNOSIS — S8012XA Contusion of left lower leg, initial encounter: Secondary | ICD-10-CM

## 2019-06-10 NOTE — Progress Notes (Signed)
   Subjective:    Patient ID: [name], male    DOB: 1940/07/07, 79 y.o.   MRN: [id number]  HPI Here for the onset 2 days ago of bruising and a painful knot on the left inner thigh. No recent trauma that he knows of, although he has been working in his yard spreading mulch, etc. No chest pain or SOB. He takes a baby ASA daily but no blood thinners. His last CBC in March was normal.    Review of Systems  Constitutional: Negative.   Respiratory: Negative.   Cardiovascular: Negative.   Skin: Positive for color change.       Objective:   Physical Exam Constitutional:      General: He is not in acute distress.    Appearance: Normal appearance.  Cardiovascular:     Rate and Rhythm: Normal rate and regular rhythm.     Pulses: Normal pulses.     Heart sounds: Normal heart sounds.  Pulmonary:     Effort: Pulmonary effort is normal.     Breath sounds: Normal breath sounds.  Musculoskeletal:     Comments: The inner left thigh has a 8 cm area of ecchymosis and a 2 cm firm tender knot just under the skin. The remainder of the leg appears normal, no swelling   Neurological:     Mental Status: He is alert.           Assessment & Plan:  This appears to be a hematoma with associated bruising. Likely the result of unrecognized trauma. This will resolve on its own over the next 4-6 weeks. We will set up a venous doppler to rule out other problems however.  Alysia Penna, MD

## 2019-06-13 ENCOUNTER — Ambulatory Visit (HOSPITAL_COMMUNITY)
Admission: RE | Admit: 2019-06-13 | Discharge: 2019-06-13 | Disposition: A | Discharge status: Home / Self Care | Payer: Medicare HMO | Source: Ambulatory Visit | Attending: Cardiology | Admitting: Cardiology

## 2019-06-13 ENCOUNTER — Other Ambulatory Visit: Payer: Self-pay

## 2019-06-13 DIAGNOSIS — S8012XA Contusion of left lower leg, initial encounter: Secondary | ICD-10-CM | POA: Insufficient documentation

## 2019-06-22 ENCOUNTER — Encounter: Payer: Self-pay | Admitting: Adult Health

## 2019-06-24 ENCOUNTER — Other Ambulatory Visit: Payer: Self-pay

## 2019-06-24 NOTE — Telephone Encounter (Signed)
Received message patient wanted to discuss upcoming CCM visit.   Reviewed purpose of CCM and patient requested to have his CCM visit scheduled on 7/1 be switched with his spouse's visit on 7/28 with Ena Dawley as he feels his spouse would need to have medications reviewed first.   Moved patient's spouse visit to 7/1 as requested.

## 2019-06-25 ENCOUNTER — Ambulatory Visit (INDEPENDENT_AMBULATORY_CARE_PROVIDER_SITE_OTHER): Payer: Medicare HMO | Admitting: Family Medicine

## 2019-06-25 ENCOUNTER — Encounter: Payer: Self-pay | Admitting: Family Medicine

## 2019-06-25 VITALS — BP 120/70 | HR 64 | Temp 98.0°F | Wt 174.0 lb

## 2019-06-25 DIAGNOSIS — S8012XD Contusion of left lower leg, subsequent encounter: Secondary | ICD-10-CM

## 2019-06-25 NOTE — Progress Notes (Signed)
   Subjective:    Patient ID: Alexander [name], male    DOB: August 13, 1940, 79 y.o.   MRN: [id number]  HPI Here to follow up on a small hematoma in the left thigh. We saw him last week when there was a large ecchymosis and some tenderness in the area, but this has resolved. A venous doppler of the leg was unremarkable. Today he says the lump is still present and he wants to make sure it is okay.    Review of Systems  Constitutional: Negative.   Respiratory: Negative.   Cardiovascular: Negative.        Objective:   Physical Exam Constitutional:      Appearance: Normal appearance.  Cardiovascular:     Rate and Rhythm: Normal rate and regular rhythm.     Pulses: Normal pulses.     Heart sounds: Normal heart sounds.  Pulmonary:     Effort: Pulmonary effort is normal.     Breath sounds: Normal breath sounds.  Musculoskeletal:     Comments: The medial left thigh has a small non-tender firm lump under the skin  Neurological:     Mental Status: He is alert.           Assessment & Plan:  Hematoma. This is slowly resolving as expected. Recheck prn. Alysia Penna, MD

## 2019-07-03 ENCOUNTER — Ambulatory Visit: Payer: Medicare HMO

## 2019-07-03 ENCOUNTER — Other Ambulatory Visit: Payer: Self-pay | Admitting: Adult Health

## 2019-07-03 ENCOUNTER — Other Ambulatory Visit: Payer: Self-pay

## 2019-07-03 DIAGNOSIS — D45 Polycythemia vera: Secondary | ICD-10-CM

## 2019-07-03 DIAGNOSIS — E785 Hyperlipidemia, unspecified: Secondary | ICD-10-CM

## 2019-07-03 DIAGNOSIS — M1000 Idiopathic gout, unspecified site: Secondary | ICD-10-CM

## 2019-07-03 DIAGNOSIS — I10 Essential (primary) hypertension: Secondary | ICD-10-CM

## 2019-07-03 DIAGNOSIS — K219 Gastro-esophageal reflux disease without esophagitis: Secondary | ICD-10-CM

## 2019-07-03 DIAGNOSIS — R7303 Prediabetes: Secondary | ICD-10-CM

## 2019-07-03 NOTE — Chronic Care Management (AMB) (Signed)
Chronic Care Management Pharmacy   Name: Alexander [name]  MRN: [id number] DOB: [birth year]   Initial Questions: 1. Have you seen any other providers since your last visit? NA 2. Any changes in your medicines or health? No   Chief Complaint/ HPI  Alexander [name],  79 y.o. , male presents for their Initial CCM visit with the clinical pharmacist In office.  PCP : [name], NP  Their chronic conditions include: HTN, HLD, GERD, Gout, prediabetes, CAD, Polycythemia  Office Visits: 06/25/2019- Alysia Penna, MD- Patient presented for office visit for small hematoma in left thigh. Hematoma slowly resolving as expected. Recheck as needed.   06/10/2019- Alysia Penna, MD- Patient presented for office visit for bruise and painful know on left inner thigh. Most likley hematoma. To resolve over next 4 to 6 weeks. Patient to obtain venous doppler to rule out other problems.   04/16/2019- [name], NP- Patient presented for office visit for right side upper back pain. Patient opted for trigger point injections (methylprednisolone injection 39m). Patient to follow up as needed.   Consult Visit: 03/26/2019- [place] NP- Patient presented for office visit for secondary polycythemia vera- JAK2 negative. Phlebotomy to maintain hematocrit below 45%- will donate with [facility] as needed. Patient to follow up in 4 months.   12/09/2018- Cardiology- MCandee Furbish MD- Patient presented for office visit for CAD follow up. Patient stable. No major interventions. Patient to follow up in 1 year.   Medications: Outpatient Encounter Medications as of 07/03/2019  Medication Sig  . ACCU-CHEK SOFTCLIX LANCETS lancets Use to test blood glucose once daily  . allopurinol (ZYLOPRIM) 100 MG tablet Take 1 tablet (100 mg total) by mouth daily.  .Marland KitchenamLODipine (NORVASC) 10 MG tablet Take 1 tablet (10 mg total) by mouth daily.  .Marland Kitchenaspirin EC 81 MG tablet Take 1 tablet (81 mg total) by mouth daily.    . Blood Glucose Monitoring Suppl (ACCU-CHEK AVIVA PLUS) w/Device KIT Use to test blood glucose once daily  . Coenzyme Q10 300 MG CAPS Take 1 capsule by mouth daily.   .Marland Kitchenglucose blood (ACCU-CHEK AVIVA) test strip Use to test blood glucose once daily  . lisinopril (ZESTRIL) 10 MG tablet Take 1 tablet (10 mg total) by mouth daily.  . pantoprazole (PROTONIX) 40 MG tablet Take 1 tablet (40 mg total) by mouth daily.  . pravastatin (PRAVACHOL) 40 MG tablet Take 1 tablet (40 mg total) by mouth daily.  .Marland KitchenPREVIDENT 5000 ENAMEL PROTECT 1.1-5 % PSTE See admin instructions.  .Marland KitchenPROCTO-MED HC 2.5 % rectal cream APPLY 1 CREAM RECTALLY TWICE DAILY   No facility-administered encounter medications on file as of 07/03/2019.     Current Diagnosis/Assessment:  Goals Addressed   None    SDOH Interventions     Most Recent Value  SDOH Interventions  Financial Strain Interventions Intervention Not Indicated  Transportation Interventions Intervention Not Indicated       Prediabetes   Patient reports eating healthy options (fruits and vegetables) and staying active. In summer time, patient and spouse swim ~1hr.   Patient reports having A1c   Recent Relevant Labs: Lab Results  Component Value Date/Time   HGBA1C 6.3 09/27/2018 09:23 AM   HGBA1C 6.2 09/06/2017 11:03 AM   EGFR 51 (L) 12/29/2015 09:54 AM   EGFR 56 (L) 09/27/2015 01:05 PM   MICROALBUR 5.14 (H) 07/21/2008 10:52 PM   MICROALBUR 5.0 (H) 06/11/2007 10:11 AM    Checking BG: occasionally  Recent FBG Readings: 115  Patient has failed these meds in past: metformin (diarrhea)   Patient is currently controlled on the following medications:   No medications   We discussed: diet and exercise extensively  Plan Continue control with diet and exercise   Hypertension  Denies dizziness/ lightheadedness.   Office blood pressures are  BP Readings from Last 3 Encounters:  06/25/19 120/70  06/10/19 124/60  04/16/19 128/80    Patient  has failed these meds in the past: olmesartan, losartan, carvedilol, metoprolol, valsartan   Patient checks BP at home patient reports not checking at home since he does not see a need   Patient home BP readings are ranging: NA  Patient is controlled on:(based on OV readings)    Amlodipine 72m, 1 tablet once daily  Lisinopril 162m 1 tablet once daily   We discussed diet and exercise extensively  Plan Continue current medications and control with diet and exercise   Hyperlipidemia   Lipid Panel     Component Value Date/Time   CHOL 168 09/27/2018 0923   TRIG 68.0 09/27/2018 0923   HDL 47.00 09/27/2018 0923   LDLCALC 107 (H) 09/27/2018 0923    Hepatic Function Latest Ref Rng & Units 03/26/2019 11/25/2018 09/27/2018  Total Protein 6.5 - 8.1 g/dL 7.0 7.3 7.1  Albumin 3.5 - 5.0 g/dL 4.3 4.4 4.4  AST 15 - 41 U/L 13(L) 15 14  ALT 0 - 44 U/L '13 13 12  ' Alk Phosphatase 38 - 126 U/L 75 62 79  Total Bilirubin 0.3 - 1.2 mg/dL 0.5 0.7 0.5  Bilirubin, Direct 0.0 - 0.3 mg/dL - - -     The 10-year ASCVD risk score (GMikey BussingC Jr., et al., 2013) is: 52%   Values used to calculate the score:     Age: [age]ears     Sex: Male     Is Non-Hispanic African American: No     Diabetic: Yes     Tobacco smoker: No     Systolic Blood Pressure: 12742mHg     Is BP treated: Yes     HDL Cholesterol: 47 mg/dL     Total Cholesterol: 168 mg/dL   Patient has failed these meds in past: simvastatin  Patient is currently uncontrolled on the following medications:  . Pravastatin 4039m1 tablet once daily   We discussed:  diet and exercise extensively  Plan Continue current medications and control with diet and exercise  Consider higher intensity statin due to high ASCVD. Reassess at follow up.   CAD   Patient is currently controlled on the following medications:  . Aspirin 30m40m tablet once daily  . Coenzyme Q10 300mg80mcapsule once daily   Plan Managed by cardiology Continue current  medications  Polycythemia rubra vera    Patient plans to set up to donate blood in future.  Patient is currently controlled on the following medications:  . Aspirin 30mg,17mablet once daily   Plan Managed by oncology Continue current medications  GERD   Patient reports taking for many years. Denies GI sx.   Patient has failed these meds in past: esomeprazole   Patient is currently controlled on the following medications:  . Pantoprazole 40mg,113mlet once daily   Plan Continue current medications  Reassess at follow up taper plan for PPI.   Gout   Uric acid: no recent reading  Patient reports taking since 1971 and has not flare ups since.    Patient has failed these meds in past: none  Patient is currently controlled on the following medications:  . Allopurinol 197m, 1 tablet once daily   Plan Continue current medications  Hemorrhoids    Patient is currently controlled on the following medications:  . Procto-Med HC 2.5% rectal cream, apply cream rectally twice daily PRN  . Hydrocortisone (Anusol- HC) 261msuppository, use 1 suppository rectally as needed   Plan Continue current medications  Medication Management  Patient organizes medications: patient reports having own strategy. He has pantoprazole in pill bottle and takes before breakfast and all other medications are organized in pill box and are taken before bedtime  [facility] (patient obtains medications with coupon due to cost effectiveness) - allopurinol 10074mfilled 01/12/19 for 90DS)  - lisinopril 53m52milled 01/12/19 for 90DS) - pantoprazole 40mg62mlled 01/12/19 for 90DS) - amlodipine 53mg 60mled 01/12/19 for 90DS)   Follow-up Follow up visit with PharmD in 5 months.   AnnettAnson CroftsmD Clinical Pharmacist [facility] [phone]

## 2019-07-03 NOTE — Patient Instructions (Addendum)
Visit Information  Goals Addressed            This Visit's Progress   . Pharmacy Care Plan       CARE PLAN ENTRY (see longitudinal plan of care for additional care plan information)  Current Barriers:  . Chronic Disease Management support, education, and care coordination needs related to Hypertension, Hyperlipidemia, Diabetes, GERD, Gout, and Polycythemia   Hypertension BP Readings from Last 3 Encounters:  06/25/19 120/70  06/10/19 124/60  04/16/19 128/80   . Pharmacist Clinical Goal(s): o Over the next 180 days, patient will work with PharmD and providers to maintain BP goal <130/80 . Current regimen:   Amlodipine 10mg , 1 tablet once daily  Lisinopril 10mg , 1 tablet once daily  . Interventions: . Discussed diet modifications. DASH diet:  following a diet emphasizing fruits and vegetables and low-fat dairy products along with whole grains, fish, poultry, and nuts. Reducing red meats and sugars.  . Patient self care activities - Over the next 180 days, patient will: o Check BP as instructed, document, and provide at future appointments  Hyperlipidemia Lab Results  Component Value Date/Time   LDLCALC 107 (H) 09/27/2018 09:23 AM   . Pharmacist Clinical Goal(s): o Over the next 180 days, patient will work with PharmD and providers to achieve LDL goal < 100 . Current regimen:  o Pravastatin 40mg , 1 tablet once daily . Interventions: o How to reduce cholesterol through diet/weight management and physical activity.    o We discussed how a diet high in plant sterols (fruits/vegetables/nuts/whole grains/legumes) may reduce your cholesterol.  Encouraged increasing fiber to a daily  . Patient self care activities - Over the next 180 days, patient will: o Continue current medications and continue lifestyle modifications (diet/ exercise).   Prediabetes Lab Results  Component Value Date/Time   HGBA1C 6.3 09/27/2018 09:23 AM   HGBA1C 6.2 09/06/2017 11:03 AM   . Pharmacist  Clinical Goal(s): o Over the next 180 days, patient will work with PharmD and providers to maintain A1c goal <6.5% . Current regimen:  o No medications (lifestyle modifications only) . Patient self care activities - Over the next 180 days, patient will: o Check blood sugar as directed, document, and provide at future appointments  Polycythemia . Pharmacist Clinical Goal(s) o Over the next 180 days, patient will work with PharmD and providers to continue current medications and visits with oncologist.  . Current regimen:  o Aspirin 81mg , 1 tablet once daily  . Patient self care activities - Over the next 180 days, patient will: o Continue current medications and contact [facility] for blood donations.  GERD . Pharmacist Clinical Goal(s) o Over the next 180 days, patient will work with PharmD and providers to maintain/ minimize heartburn symptoms.  . Current regimen:  o Pantoprazole 40mg ,1 tablet once daily  . Patient self care activities o Patient will continue current medications.   Gout . Pharmacist Clinical Goal(s) o Over the next 180 days, patient will work with PharmD and providers to prevent gout flare-ups . Current regimen:  o Allopurinol 100mg , 1 tablet once daily  . Patient self care activities o Patient will continue current medications.   Medication management . Pharmacist Clinical Goal(s): o Over the next 180 days, patient will work with PharmD and providers to maintain optimal medication adherence . [name]  . Interventions o Comprehensive medication review performed. o Continue current medication management strategy . Patient self care activities - Over the next 180 days, patient will: o  Take medications as prescribed o Report any questions or concerns to PharmD and/or provider(s)  Initial goal documentation        Alexander [name] was given information about Chronic Care Management services today including:  1. CCM service includes  personalized support from designated clinical staff supervised by his physician, including individualized plan of care and coordination with other care providers 2. 24/7 contact phone numbers for assistance for urgent and routine care needs. 3. Standard insurance, coinsurance, copays and deductibles apply for chronic care management only during months in which we provide at least 20 minutes of these services. Most insurances cover these services at 100%, however patients may be responsible for any copay, coinsurance and/or deductible if applicable. This service may help you avoid the need for more expensive face-to-face services. 4. Only one practitioner may furnish and bill the service in a calendar month. 5. The patient may stop CCM services at any time (effective at the end of the month) by phone call to the office staff.  Patient agreed to services and verbal consent obtained.   The patient verbalized understanding of instructions provided today and agreed to receive a mailed copy of patient instruction and/or educational materials. The pharmacy team will reach out to the patient again over the next 180 days.   Anson Crofts, PharmD Clinical Pharmacist [facility] [place] [phone]    Heart-Healthy Eating Plan Heart-healthy meal planning includes:  Eating less unhealthy fats.  Eating more healthy fats.  Making other changes in your diet. Talk with your doctor or a diet specialist (dietitian) to create an eating plan that is right for you. What is my plan? Your doctor may recommend an eating plan that includes:  Total fat: ______% or less of total calories a day.  Saturated fat: ______% or less of total calories a day.  Cholesterol: less than _________mg a day. What are tips for following this plan? Cooking Avoid frying your food. Try to bake, boil, grill, or broil it instead. You can also reduce fat by:  Removing the skin from poultry.  Removing  all visible fats from meats.  Steaming vegetables in water or broth. Meal planning   At meals, divide your plate into four equal parts: ? Fill one-half of your plate with vegetables and green salads. ? Fill one-fourth of your plate with whole grains. ? Fill one-fourth of your plate with lean protein foods.  Eat 4-5 servings of vegetables per day. A serving of vegetables is: ? 1 cup of raw or cooked vegetables. ? 2 cups of raw leafy greens.  Eat 4-5 servings of fruit per day. A serving of fruit is: ? 1 medium whole fruit. ?  cup of dried fruit. ?  cup of fresh, frozen, or canned fruit. ?  cup of 100% fruit juice.  Eat more foods that have soluble fiber. These are apples, broccoli, carrots, beans, peas, and barley. Try to get 20-30 g of fiber per day.  Eat 4-5 servings of nuts, legumes, and seeds per week: ? 1 serving of dried beans or legumes equals  cup after being cooked. ? 1 serving of nuts is  cup. ? 1 serving of seeds equals 1 tablespoon. General information  Eat more home-cooked food. Eat less restaurant, buffet, and fast food.  Limit or avoid alcohol.  Limit foods that are high in starch and sugar.  Avoid fried foods.  Lose weight if you are overweight.  Keep track of how much salt (sodium) you eat.  This is important if you have high blood pressure. Ask your doctor to tell you more about this.  Try to add vegetarian meals each week. Fats  Choose healthy fats. These include olive oil and canola oil, flaxseeds, walnuts, almonds, and seeds.  Eat more omega-3 fats. These include salmon, mackerel, sardines, tuna, flaxseed oil, and ground flaxseeds. Try to eat fish at least 2 times each week.  Check food labels. Avoid foods with trans fats or high amounts of saturated fat.  Limit saturated fats. ? These are often found in animal products, such as meats, butter, and cream. ? These are also found in plant foods, such as palm oil, palm kernel oil, and coconut  oil.  Avoid foods with partially hydrogenated oils in them. These have trans fats. Examples are stick margarine, some tub margarines, cookies, crackers, and other baked goods. What foods can I eat? Fruits All fresh, canned (in natural juice), or frozen fruits. Vegetables Fresh or frozen vegetables (raw, steamed, roasted, or grilled). Green salads. Grains Most grains. Choose whole wheat and whole grains most of the time. Rice and pasta, including brown rice and pastas made with whole wheat. Meats and other proteins Lean, well-trimmed beef, veal, pork, and lamb. Chicken and [place] without skin. All fish and shellfish. Wild duck, rabbit, pheasant, and venison. Egg whites or low-cholesterol egg substitutes. Dried beans, peas, lentils, and tofu. Seeds and most nuts. Dairy Low-fat or nonfat cheeses, including ricotta and mozzarella. Skim or 1% milk that is liquid, powdered, or evaporated. Buttermilk that is made with low-fat milk. Nonfat or low-fat yogurt. Fats and oils Non-hydrogenated (trans-free) margarines. Vegetable oils, including soybean, sesame, sunflower, olive, peanut, safflower, corn, canola, and cottonseed. Salad dressings or mayonnaise made with a vegetable oil. Beverages Mineral water. Coffee and tea. Diet carbonated beverages. Sweets and desserts Sherbet, gelatin, and fruit ice. Small amounts of dark chocolate. Limit all sweets and desserts. Seasonings and condiments All seasonings and condiments. The items listed above may not be a complete list of foods and drinks you can eat. Contact a dietitian for more options. What foods should I avoid? Fruits Canned fruit in heavy syrup. Fruit in cream or butter sauce. Fried fruit. Limit coconut. Vegetables Vegetables cooked in cheese, cream, or butter sauce. Fried vegetables. Grains Breads that are made with saturated or trans fats, oils, or whole milk. Croissants. Sweet rolls. Donuts. High-fat crackers, such as cheese crackers. Meats  and other proteins Fatty meats, such as hot dogs, ribs, sausage, bacon, rib-eye roast or steak. High-fat deli meats, such as salami and bologna. Caviar. Domestic duck and goose. Organ meats, such as liver. Dairy Cream, sour cream, cream cheese, and creamed cottage cheese. Whole-milk cheeses. Whole or 2% milk that is liquid, evaporated, or condensed. Whole buttermilk. Cream sauce or high-fat cheese sauce. Yogurt that is made from whole milk. Fats and oils Meat fat, or shortening. Cocoa butter, hydrogenated oils, palm oil, coconut oil, palm kernel oil. Solid fats and shortenings, including bacon fat, salt pork, lard, and butter. Nondairy cream substitutes. Salad dressings with cheese or sour cream. Beverages Regular sodas and juice drinks with added sugar. Sweets and desserts Frosting. Pudding. Cookies. Cakes. Pies. Milk chocolate or white chocolate. Buttered syrups. Full-fat ice cream or ice cream drinks. The items listed above may not be a complete list of foods and drinks to avoid. Contact a dietitian for more information. Summary  Heart-healthy meal planning includes eating less unhealthy fats, eating more healthy fats, and making other changes in your diet.  Eat a balanced diet. This includes fruits and vegetables, low-fat or nonfat dairy, lean protein, nuts and legumes, whole grains, and heart-healthy oils and fats. This information is not intended to replace advice given to you by your health care provider. Make sure you discuss any questions you have with your health care provider. Document Revised: 02/22/2017 Document Reviewed: 01/26/2017 Elsevier Patient Education  2020 [employer].

## 2019-07-25 ENCOUNTER — Telehealth: Payer: Self-pay | Admitting: *Deleted

## 2019-07-25 ENCOUNTER — Telehealth: Payer: Self-pay | Admitting: Hematology & Oncology

## 2019-07-25 ENCOUNTER — Encounter: Payer: Self-pay | Admitting: Hematology & Oncology

## 2019-07-25 ENCOUNTER — Other Ambulatory Visit: Payer: Self-pay

## 2019-07-25 ENCOUNTER — Inpatient Hospital Stay: Payer: Medicare HMO

## 2019-07-25 ENCOUNTER — Inpatient Hospital Stay: Payer: Medicare HMO | Attending: Hematology & Oncology

## 2019-07-25 ENCOUNTER — Inpatient Hospital Stay (HOSPITAL_BASED_OUTPATIENT_CLINIC_OR_DEPARTMENT_OTHER): Payer: Medicare HMO | Admitting: Hematology & Oncology

## 2019-07-25 VITALS — BP 133/82 | HR 62 | Temp 97.7°F | Resp 20 | Wt 173.0 lb

## 2019-07-25 DIAGNOSIS — D751 Secondary polycythemia: Secondary | ICD-10-CM | POA: Insufficient documentation

## 2019-07-25 DIAGNOSIS — D45 Polycythemia vera: Secondary | ICD-10-CM | POA: Diagnosis not present

## 2019-07-25 DIAGNOSIS — Z7982 Long term (current) use of aspirin: Secondary | ICD-10-CM | POA: Diagnosis not present

## 2019-07-25 DIAGNOSIS — Z79899 Other long term (current) drug therapy: Secondary | ICD-10-CM | POA: Insufficient documentation

## 2019-07-25 DIAGNOSIS — K259 Gastric ulcer, unspecified as acute or chronic, without hemorrhage or perforation: Secondary | ICD-10-CM | POA: Insufficient documentation

## 2019-07-25 DIAGNOSIS — D500 Iron deficiency anemia secondary to blood loss (chronic): Secondary | ICD-10-CM

## 2019-07-25 LAB — CBC WITH DIFFERENTIAL (CANCER CENTER ONLY)
Abs Immature Granulocytes: 0.04 10*3/uL (ref 0.00–0.07)
Basophils Absolute: 0.1 10*3/uL (ref 0.0–0.1)
Basophils Relative: 1 %
Eosinophils Absolute: 0.2 10*3/uL (ref 0.0–0.5)
Eosinophils Relative: 2 %
HCT: 48 % (ref 39.0–52.0)
Hemoglobin: 15.8 g/dL (ref 13.0–17.0)
Immature Granulocytes: 0 %
Lymphocytes Relative: 28 %
Lymphs Abs: 2.7 10*3/uL (ref 0.7–4.0)
MCH: 31.1 pg (ref 26.0–34.0)
MCHC: 32.9 g/dL (ref 30.0–36.0)
MCV: 94.5 fL (ref 80.0–100.0)
Monocytes Absolute: 1.2 10*3/uL — ABNORMAL HIGH (ref 0.1–1.0)
Monocytes Relative: 13 %
Neutro Abs: 5.3 10*3/uL (ref 1.7–7.7)
Neutrophils Relative %: 56 %
Platelet Count: 229 10*3/uL (ref 150–400)
RBC: 5.08 MIL/uL (ref 4.22–5.81)
RDW: 14.6 % (ref 11.5–15.5)
WBC Count: 9.5 10*3/uL (ref 4.0–10.5)
nRBC: 0 % (ref 0.0–0.2)

## 2019-07-25 LAB — CMP (CANCER CENTER ONLY)
ALT: 15 U/L (ref 0–44)
AST: 16 U/L (ref 15–41)
Albumin: 4.8 g/dL (ref 3.5–5.0)
Alkaline Phosphatase: 74 U/L (ref 38–126)
Anion gap: 9 (ref 5–15)
BUN: 21 mg/dL (ref 8–23)
CO2: 29 mmol/L (ref 22–32)
Calcium: 10.5 mg/dL — ABNORMAL HIGH (ref 8.9–10.3)
Chloride: 104 mmol/L (ref 98–111)
Creatinine: 1.29 mg/dL — ABNORMAL HIGH (ref 0.61–1.24)
GFR, Est AFR Am: >60 mL/min
GFR, Estimated: 52 mL/min — ABNORMAL LOW
Glucose, Bld: 103 mg/dL — ABNORMAL HIGH (ref 70–99)
Potassium: 4.2 mmol/L (ref 3.5–5.1)
Sodium: 142 mmol/L (ref 135–145)
Total Bilirubin: 0.7 mg/dL (ref 0.3–1.2)
Total Protein: 7.8 g/dL (ref 6.5–8.1)

## 2019-07-25 NOTE — Telephone Encounter (Signed)
Appointments scheduled calendar printed per 7/23 los

## 2019-07-25 NOTE — Progress Notes (Signed)
Hematology and Oncology Follow Up Visit  Alexander [name] [id number] 1940-12-22 79 y.o. 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 is in for a followup.  He is doing quite well.  Unfortunately, his wife is now doing all that well.  She apparently has a gastric ulcer.  This is from taking nonsteroidals.  She had been on Nexium which was stopped.  She is now back on Nexium.  He is swimming quite a bit.  He swims almost every day.  He has tried to give blood to [facility]  He was told that since he has polycythemia vera, he cannot donate blood.  I will have to inform them that he does not have polycythemia vera.  He is JAK2 negative.  He has a secondary polycythemia/erythrocytosis.  As such, his blood would be safe for donation.  He has lost some weight.  He is change his appetite little bit.  He is change his diet a little bit.  There has been no issues with bowels or bladder.  He has had no bleeding.  There has been no fever.  He and his wife are still being very cautious with the coronavirus.  Para he has had no change in bowel or bladder habits.  He has had no problems with funky as expected, he and his wife are being very careful.  The be very cautious with the coronavirus.    Only last saw him back in March, his ferritin was 33 with an iron saturation of 16%.  Overall, his performance status is [name].  Medications:  Current Outpatient Medications:  .  ACCU-CHEK SOFTCLIX LANCETS lancets, Use to test blood glucose once daily, Disp: 100 each, Rfl: 3 .  allopurinol (ZYLOPRIM) 100 MG tablet, Take 1 tablet (100 mg total) by mouth daily., Disp: 360 tablet, Rfl: 0 .  amLODipine (NORVASC) 10 MG tablet, Take 1 tablet (10 mg total) by mouth daily., Disp: 90 tablet, Rfl: 3 .  aspirin EC 81 MG tablet,  Take 1 tablet (81 mg total) by mouth daily., Disp: 90 tablet, Rfl: 3 .  Blood Glucose Monitoring Suppl (ACCU-CHEK AVIVA PLUS) w/Device KIT, Use to test blood glucose once daily, Disp: 1 kit, Rfl: 0 .  Coenzyme Q10 300 MG CAPS, Take 1 capsule by mouth daily. , Disp: , Rfl:  .  glucose blood (ACCU-CHEK AVIVA) test strip, Use to test blood glucose once daily, Disp: 100 each, Rfl: 3 .  hydrocortisone (ANUSOL-HC) 25 MG suppository, Place 25 mg rectally daily as needed for hemorrhoids or anal itching., Disp: , Rfl:  .  lisinopril (ZESTRIL) 10 MG tablet, Take 1 tablet (10 mg total) by mouth daily., Disp: 90 tablet, Rfl: 3 .  pantoprazole (PROTONIX) 40 MG tablet, Take 1 tablet (40 mg total) by mouth daily., Disp: 360 tablet, Rfl: 0 .  pravastatin (PRAVACHOL) 40 MG tablet, Take 1 tablet (40 mg total) by mouth daily., Disp: 365 tablet, Rfl: 0 .  PREVIDENT 5000 ENAMEL PROTECT 1.1-5 % PSTE, See admin instructions., Disp: , Rfl:  .  PROCTO-MED HC 2.5 % rectal cream, APPLY 1 CREAM RECTALLY TWICE DAILY (Patient taking differently: APPLY 1 CREAM RECTALLY TWICE DAILY PRN), Disp: 90 g, Rfl: 3  Allergies:  Allergies  Allergen Reactions  . Metformin And Related Diarrhea    Past Medical History, Surgical  history, Social history, and Family History were reviewed and updated.  Review of Systems: Review of Systems  Constitutional: Negative.   HENT: Negative.   Eyes: Negative.   Respiratory: Negative.   Cardiovascular: Negative.   Gastrointestinal: Negative.   Genitourinary: Negative.   Musculoskeletal: Negative.   Skin: Negative.   Neurological: Negative.   Endo/Heme/Allergies: Negative.   Psychiatric/Behavioral: Negative.     Physical Exam:  weight is 173 lb 0.3 oz (78.5 kg). His oral temperature is 97.7 F (36.5 C). His blood pressure is 133/82 (abnormal) and his pulse is 62. His respiration is 20 and oxygen saturation is 100%.   Physical Exam Vitals reviewed.  HENT:     Head: Normocephalic and  atraumatic.  Eyes:     Pupils: Pupils are equal, round, and reactive to light.  Cardiovascular:     Rate and Rhythm: Normal rate and regular rhythm.     Heart sounds: Normal heart sounds.  Pulmonary:     Effort: Pulmonary effort is normal.     Breath sounds: Normal breath sounds.  Abdominal:     General: Bowel sounds are normal.     Palpations: Abdomen is soft.  Musculoskeletal:        General: No tenderness or deformity. Normal range of motion.     Cervical back: Normal range of motion.  Lymphadenopathy:     Cervical: No cervical adenopathy.  Skin:    General: Skin is warm and dry.     Findings: No erythema or rash.  Neurological:     Mental Status: He is alert and oriented to person, place, and time.  Psychiatric:        Behavior: Behavior normal.        Thought Content: Thought content normal.        Judgment: Judgment normal.      Lab Results  Component Value Date   WBC 9.5 07/25/2019   HGB 15.8 07/25/2019   HCT 48.0 07/25/2019   MCV 94.5 07/25/2019   PLT 229 07/25/2019     Chemistry      Component Value Date/Time   NA 142 07/25/2019 1159   NA 144 11/29/2016 1054   NA 139 12/29/2015 0954   K 4.2 07/25/2019 1159   K 4.2 11/29/2016 1054   K 4.1 12/29/2015 0954   CL 104 07/25/2019 1159   CL 102 11/29/2016 1054   CO2 29 07/25/2019 1159   CO2 30 11/29/2016 1054   CO2 26 12/29/2015 0954   BUN 21 07/25/2019 1159   BUN 22 11/29/2016 1054   BUN 20.0 12/29/2015 0954   CREATININE 1.29 (H) 07/25/2019 1159   CREATININE 1.2 11/29/2016 1054   CREATININE 1.3 12/29/2015 0954      Component Value Date/Time   CALCIUM 10.5 (H) 07/25/2019 1159   CALCIUM 9.3 11/29/2016 1054   CALCIUM 9.5 12/29/2015 0954   ALKPHOS 74 07/25/2019 1159   ALKPHOS 92 (H) 11/29/2016 1054   ALKPHOS 98 12/29/2015 0954   AST 16 07/25/2019 1159   AST 16 12/29/2015 0954   ALT 15 07/25/2019 1159   ALT 20 11/29/2016 1054   ALT 13 12/29/2015 0954   BILITOT 0.7 07/25/2019 1159   BILITOT 0.71  12/29/2015 0954      Impression and Plan: Alexander [name] is 79 year old gentleman. He has secondary polycythemia/erythrocytosis. He is JAK2 negative.   For right now, we will go ahead and phlebotomize him.  Again, we will try to talk with [facility] and see if they will not  be able to take him for donation.  He is a blood type is A+.  This is necessary for the community.  We will go ahead and plan for another follow-up in 4 months.    [name], MD 7/23/202112:41 PM

## 2019-07-25 NOTE — Progress Notes (Signed)
Alexander [name] presents today for phlebotomy per MD orders. Phlebotomy procedure started at 1251 and ended at 1300 506 grams removed via 18 gauge needle to right AC.  Patient observed for 20 minutes after procedure without any incident. Declined to stay for full 30 minutes post observation time today.  Patient tolerated procedure well. Patient understands to call if he has any questions or concerns post discharge.

## 2019-07-25 NOTE — Telephone Encounter (Signed)
This nurse called patient and gave him the phone number to [facility] with [facility] [employer]. Phone number is [phone]. He verbalized understanding.

## 2019-07-25 NOTE — Patient Instructions (Signed)

## 2019-07-28 LAB — IRON AND TIBC
Iron: 70 ug/dL (ref 42–163)
Saturation Ratios: 19 % — ABNORMAL LOW (ref 20–55)
TIBC: 365 ug/dL (ref 202–409)
UIBC: 295 ug/dL (ref 117–376)

## 2019-07-28 LAB — FERRITIN: Ferritin: 57 ng/mL (ref 24–336)

## 2019-09-26 ENCOUNTER — Ambulatory Visit (INDEPENDENT_AMBULATORY_CARE_PROVIDER_SITE_OTHER): Payer: Medicare HMO

## 2019-09-26 ENCOUNTER — Other Ambulatory Visit: Payer: Self-pay

## 2019-09-26 DIAGNOSIS — Z0000 Encounter for general adult medical examination without abnormal findings: Secondary | ICD-10-CM

## 2019-09-26 NOTE — Patient Instructions (Signed)
[name] , Thank you for taking time to come for your Medicare Wellness Visit. I appreciate your ongoing commitment to your health goals. Please review the following plan we discussed and let me know if I can assist you in the future.   Screening recommendations/referrals: Colonoscopy: Currently due for Colonoscopy, please contact Dr. Silvio Pate office  Recommended yearly ophthalmology/optometry visit for glaucoma screening and checkup Recommended yearly dental visit for hygiene and checkup  Vaccinations: Influenza vaccine: Currently due, you may receive at your next office visit or at your local pharmacy  Pneumococcal vaccine: Completed series Tdap vaccine: Up to date, next due 01/16/2022 Shingles vaccine: Completed series    Advanced directives: Please bring a copy of your medical advanced directives into the office so that we may scan them into your chart.  Conditions/risks identified: None   Next appointment: None   Preventive Care 65 Years and Older, Male Preventive care refers to lifestyle choices and visits with your health care provider that can promote health and wellness. What does preventive care include?  A yearly physical exam. This is also called an annual well check.  Dental exams once or twice a year.  Routine eye exams. Ask your health care provider how often you should have your eyes checked.  Personal lifestyle choices, including:  Daily care of your teeth and gums.  Regular physical activity.  Eating a healthy diet.  Avoiding tobacco and drug use.  Limiting alcohol use.  Practicing safe sex.  Taking low doses of aspirin every day.  Taking vitamin and mineral supplements as recommended by your health care provider. What happens during an annual well check? The services and screenings done by your health care provider during your annual well check will depend on your age, overall health, lifestyle risk factors, and family history of  disease. Counseling  Your health care provider may ask you questions about your:  Alcohol use.  Tobacco use.  Drug use.  Emotional well-being.  Home and relationship well-being.  Sexual activity.  Eating habits.  History of falls.  Memory and ability to understand (cognition).  Work and work Statistician. Screening  You may have the following tests or measurements:  Height, weight, and BMI.  Blood pressure.  Lipid and cholesterol levels. These may be checked every 5 years, or more frequently if you are over 43 years old.  Skin check.  Lung cancer screening. You may have this screening every year starting at age 37 if you have a 30-pack-year history of smoking and currently smoke or have quit within the past 15 years.  Fecal occult blood test (FOBT) of the stool. You may have this test every year starting at age 74.  Flexible sigmoidoscopy or colonoscopy. You may have a sigmoidoscopy every 5 years or a colonoscopy every 10 years starting at age 57.  Prostate cancer screening. Recommendations will vary depending on your family history and other risks.  Hepatitis C blood test.  Hepatitis B blood test.  Sexually transmitted disease (STD) testing.  Diabetes screening. This is done by checking your blood sugar (glucose) after you have not eaten for a while (fasting). You may have this done every 1-3 years.  Abdominal aortic aneurysm (AAA) screening. You may need this if you are a current or former smoker.  Osteoporosis. You may be screened starting at age 56 if you are at high risk. Talk with your health care provider about your test results, treatment options, and if necessary, the need for more tests. Vaccines  Your  health care provider may recommend certain vaccines, such as:  Influenza vaccine. This is recommended every year.  Tetanus, diphtheria, and acellular pertussis (Tdap, Td) vaccine. You may need a Td booster every 10 years.  Zoster vaccine. You may  need this after age 61.  Pneumococcal 13-valent conjugate (PCV13) vaccine. One dose is recommended after age 47.  Pneumococcal polysaccharide (PPSV23) vaccine. One dose is recommended after age 73. Talk to your health care provider about which screenings and vaccines you need and how often you need them. This information is not intended to replace advice given to you by your health care provider. Make sure you discuss any questions you have with your health care provider. Document Released: 01/15/2015 Document Revised: 09/08/2015 Document Reviewed: 10/20/2014 Elsevier Interactive Patient Education  2017 [place] Prevention in the Home Falls can cause injuries. They can happen to people of all ages. There are many things you can do to make your home safe and to help prevent falls. What can I do on the outside of my home?  Regularly fix the edges of walkways and driveways and fix any cracks.  Remove anything that might make you trip as you walk through a door, such as a raised step or threshold.  Trim any bushes or trees on the path to your home.  Use bright outdoor lighting.  Clear any walking paths of anything that might make someone trip, such as rocks or tools.  Regularly check to see if handrails are loose or broken. Make sure that both sides of any steps have handrails.  Any raised decks and porches should have guardrails on the edges.  Have any leaves, snow, or ice cleared regularly.  Use sand or salt on walking paths during winter.  Clean up any spills in your garage right away. This includes oil or grease spills. What can I do in the bathroom?  Use night lights.  Install grab bars by the toilet and in the tub and shower. Do not use towel bars as grab bars.  Use non-skid mats or decals in the tub or shower.  If you need to sit down in the shower, use a plastic, non-slip stool.  Keep the floor dry. Clean up any water that spills on the floor as soon as it  happens.  Remove soap buildup in the tub or shower regularly.  Attach bath mats securely with double-sided non-slip rug tape.  Do not have throw rugs and other things on the floor that can make you trip. What can I do in the bedroom?  Use night lights.  Make sure that you have a light by your bed that is easy to reach.  Do not use any sheets or blankets that are too big for your bed. They should not hang down onto the floor.  Have a firm chair that has side arms. You can use this for support while you get dressed.  Do not have throw rugs and other things on the floor that can make you trip. What can I do in the kitchen?  Clean up any spills right away.  Avoid walking on wet floors.  Keep items that you use a lot in easy-to-reach places.  If you need to reach something above you, use a strong step stool that has a grab bar.  Keep electrical cords out of the way.  Do not use floor polish or wax that makes floors slippery. If you must use wax, use non-skid floor wax.  Do not  have throw rugs and other things on the floor that can make you trip. What can I do with my stairs?  Do not leave any items on the stairs.  Make sure that there are handrails on both sides of the stairs and use them. Fix handrails that are broken or loose. Make sure that handrails are as long as the stairways.  Check any carpeting to make sure that it is firmly attached to the stairs. Fix any carpet that is loose or worn.  Avoid having throw rugs at the top or bottom of the stairs. If you do have throw rugs, attach them to the floor with carpet tape.  Make sure that you have a light switch at the top of the stairs and the bottom of the stairs. If you do not have them, ask someone to add them for you. What else can I do to help prevent falls?  Wear shoes that:  Do not have high heels.  Have rubber bottoms.  Are comfortable and fit you well.  Are closed at the toe. Do not wear sandals.  If you  use a stepladder:  Make sure that it is fully opened. Do not climb a closed stepladder.  Make sure that both sides of the stepladder are locked into place.  Ask someone to hold it for you, if possible.  Clearly mark and make sure that you can see:  Any grab bars or handrails.  First and last steps.  Where the edge of each step is.  Use tools that help you move around (mobility aids) if they are needed. These include:  Canes.  Walkers.  Scooters.  Crutches.  Turn on the lights when you go into a dark area. Replace any light bulbs as soon as they burn out.  Set up your furniture so you have a clear path. Avoid moving your furniture around.  If any of your floors are uneven, fix them.  If there are any pets around you, be aware of where they are.  Review your medicines with your doctor. Some medicines can make you feel dizzy. This can increase your chance of falling. Ask your doctor what other things that you can do to help prevent falls. This information is not intended to replace advice given to you by your health care provider. Make sure you discuss any questions you have with your health care provider. Document Released: 10/15/2008 Document Revised: 05/27/2015 Document Reviewed: 01/23/2014 Elsevier Interactive Patient Education  2017 [employer].

## 2019-09-26 NOTE — Progress Notes (Signed)
Subjective:   Alexander [name] is a 79 y.o. male who presents for an Initial Medicare Annual Wellness Visit.  I connected with Duke Salvia today by telephone and verified that I am speaking with the correct person using two identifiers. Location patient: home Location provider: work Persons participating in the virtual visit: patient, provider.   I discussed the limitations, risks, security and privacy concerns of performing an evaluation and management service by telephone and the availability of in person appointments. I also discussed with the patient that there may be a patient responsible charge related to this service. The patient expressed understanding and verbally consented to this telephonic visit.    Interactive audio and video telecommunications were attempted between this provider and patient, however failed, due to patient having technical difficulties OR patient did not have access to video capability.  We continued and completed visit with audio only.      Review of Systems    N/A Cardiac Risk Factors include: advanced age (>43mn, >>22women);diabetes mellitus;hypertension;male gender     Objective:    Today's Vitals   There is no height or weight on file to calculate BMI.  Advanced Directives 09/26/2019 07/25/2019 03/26/2019 08/26/2018 05/23/2018 02/01/2018 11/01/2017  Does Patient Have a Medical Advance Directive? Yes Yes Yes Yes No Yes No  Type of AParamedicof [place]anac[place]Living will HWanshipLiving will HRichmondLiving will HErathLiving will - H[place]Living will -  Does patient want to make changes to medical advance directive? - - No - Patient declined - - - -  Copy of [place] Chart? No - copy requested - - - - No - copy requested -  Would patient like information on creating a medical advance directive? - - - - No - Patient  declined - -  Pre-existing out of facility DNR order (yellow form or pink MOST form) - - - - - - -    Current Medications (verified) Outpatient Encounter Medications as of 09/26/2019  Medication Sig  . ACCU-CHEK SOFTCLIX LANCETS lancets Use to test blood glucose once daily  . allopurinol (ZYLOPRIM) 100 MG tablet Take 1 tablet (100 mg total) by mouth daily.  .Marland KitchenamLODipine (NORVASC) 10 MG tablet Take 1 tablet (10 mg total) by mouth daily.  .Marland Kitchenaspirin EC 81 MG tablet Take 1 tablet (81 mg total) by mouth daily.  . Blood Glucose Monitoring Suppl (ACCU-CHEK AVIVA PLUS) w/Device KIT Use to test blood glucose once daily  . Coenzyme Q10 300 MG CAPS Take 1 capsule by mouth daily.   .Marland Kitchenglucose blood (ACCU-CHEK AVIVA) test strip Use to test blood glucose once daily  . hydrocortisone (ANUSOL-HC) 25 MG suppository Place 25 mg rectally daily as needed for hemorrhoids or anal itching.  .Marland Kitchenlisinopril (ZESTRIL) 10 MG tablet Take 1 tablet (10 mg total) by mouth daily.  . pantoprazole (PROTONIX) 40 MG tablet Take 1 tablet (40 mg total) by mouth daily.  . pravastatin (PRAVACHOL) 40 MG tablet Take 1 tablet (40 mg total) by mouth daily.  .Marland KitchenPREVIDENT 5000 ENAMEL PROTECT 1.1-5 % PSTE See admin instructions.  .Marland KitchenPROCTO-MED HC 2.5 % rectal cream APPLY 1 CREAM RECTALLY TWICE DAILY (Patient taking differently: APPLY 1 CREAM RECTALLY TWICE DAILY PRN)   No facility-administered encounter medications on file as of 09/26/2019.    Allergies (verified) Metformin and related   History: Past Medical History:  Diagnosis Date  . Arthritis    "  minor; shoulders primarily" (05/20/2013)  . CAD (coronary artery disease)    a. 05/2013 - Chest pain/unstable angina and dynamic EKG changes showing cath showing atherosclerotic coronary artery disease manifested as diffuse ectasia, normal EF.  . Diverticulosis of colon   . GERD (gastroesophageal reflux disease)   . GI bleed    secondary to Aspirin  . Gout   . Hematuria, microscopic     . Hemorrhoids   . Hyperglycemia   . Hyperlipidemia   . Hypertension   . Lumbar back pain   . Microalbuminuria   . Overweight(278.02)   . Polycythemia rubra vera ([place])   . Tubular adenoma of colon 07/2012   Past Surgical History:  Procedure Laterality Date  . CARDIAC CATHETERIZATION  05/20/2013  . CATARACT EXTRACTION W/ INTRAOCULAR LENS  IMPLANT, BILATERAL Bilateral 2008  . CYSTECTOMY  ~ 2000   " SEBACEOUS cyst removed from between shoulder blades"  . EXCISIONAL HEMORRHOIDECTOMY  1970's  . INGUINAL HERNIA REPAIR Right ~ 2010  . LEFT HEART CATHETERIZATION WITH CORONARY ANGIOGRAM N/A 05/20/2013   Procedure: LEFT HEART CATHETERIZATION WITH CORONARY ANGIOGRAM;  Surgeon: Peter M Martinique, MD;  Location[facility] C[facility] LAB;  Service: Cardiovascular;  Laterality: N/A;  . TONSILLECTOMY AND ADENOIDECTOMY  1940's  . VASECTOMY     Family History  Problem Relation Age of Onset  . Colon cancer Mother        died at 57, colorectal  . Diabetes Mother   . Heart disease Father 48       MI  . Diabetes Father   . Stomach cancer Maternal Grandfather   . Cancer Maternal Uncle    Social History   Socioeconomic History  . Marital status: Married    Spouse name: Not on file  . Number of children: 3  . Years of education: Not on file  . Highest education level: Not on file  Occupational History  . Occupation: retired Lobbyist: RETIRED  Tobacco Use  . Smoking status: Never Smoker  . Smokeless tobacco: Never Used  . Tobacco comment: NEVER USED TOBACCO  Vaping Use  . Vaping Use: Never used  Substance and Sexual Activity  . Alcohol use: No    Alcohol/week: 0.0 standard drinks  . Drug use: No  . Sexual activity: Yes    Partners: Female  Other Topics Concern  . Not on file  Social History Narrative   Marriedfor 2 years    Daughter, [name] (pediatrician--lives in [place], [place]), one in [place] - Engineer, water. One in [place] - Chief Strategy Officer          Social Determinants of Health   Financial  Resource Strain: Low Risk   . Difficulty of Paying Living Expenses: Not hard at all  Food Insecurity: No Food Insecurity  . Worried About Charity fundraiser in the Last Year: Never true  . Ran Out of Food in the Last Year: Never true  Transportation Needs: No Transportation Needs  . Lack of Transportation (Medical): No  . Lack of Transportation (Non-Medical): No  Physical Activity: Sufficiently Active  . Days of Exercise per Week: 7 days  . Minutes of Exercise per Session: 60 min  Stress: No Stress Concern Present  . Feeling of Stress : Not at all  Social Connections: Moderately Integrated  . Frequency of Communication with Friends and Family: More than three times a week  . Frequency of Social Gatherings with Friends and Family: Twice a week  . Attends Religious Services: More than  4 times per year  . Active Member of Clubs or Organizations: No  . Attends Archivist Meetings: Never  . Marital Status: Married    Tobacco Counseling Counseling given: Not Answered Comment: NEVER USED TOBACCO   Clinical Intake:  Pre-visit preparation completed: Yes  Pain : No/denies pain     Nutritional Risks: None Diabetes: Yes (Checks glucose once a week) CBG done?: No Did pt. bring in CBG monitor from home?: No  How often do you need to have someone help you when you read instructions, pamphlets, or other written materials from your doctor or pharmacy?: 1 - Never What is the last grade level you completed in school?: Some College  Diabetic?No  Interpreter Needed?: No  Information entered by :: [place] of Daily Living In your present state of health, do you have any difficulty performing the following activities: 09/26/2019  Hearing? N  Vision? N  Difficulty concentrating or making decisions? N  Walking or climbing stairs? N  Dressing or bathing? N  Doing errands, shopping? N  Preparing Food and eating ? N  Using the Toilet? N  In the past six  months, have you accidently leaked urine? N  Do you have problems with loss of bowel control? N  Managing your Medications? N  Managing your Finances? N  Housekeeping or managing your Housekeeping? N  Some recent data might be hidden    Patient Care Team: [name], NP as PCP - General (Family Medicine) Jerline Pain, MD as PCP - Cardiology (Cardiology) Earnie Larsson, [facility] as Pharmacist (Pharmacist)  Indicate any recent Medical Services you may have received from other than [facility] providers in the past year (date may be approximate).     Assessment:   This is a routine wellness examination for [employer].  Hearing/Vision screen  Hearing Screening   '125Hz'  '250Hz'  '500Hz'  '1000Hz'  '2000Hz'  '3000Hz'  '4000Hz'  '6000Hz'  '8000Hz'   Right ear:           Left ear:           Vision Screening Comments: Patient states gets eyes checked annually    Dietary issues and exercise activities discussed: Current Exercise Habits: Home exercise routine, Type of exercise: Other - see comments (Swimming), Time (Minutes): 60, Intensity: Moderate, Exercise limited by: None identified  Goals    . Patient Stated     I would like to maintain my health     . Pharmacy Care Plan     CARE PLAN ENTRY (see longitudinal plan of care for additional care plan information)  Current Barriers:  . Chronic Disease Management support, education, and care coordination needs related to Hypertension, Hyperlipidemia, Diabetes, GERD, Gout, and Polycythemia   Hypertension BP Readings from Last 3 Encounters:  06/25/19 120/70  06/10/19 124/60  04/16/19 128/80   . Pharmacist Clinical Goal(s): o Over the next 180 days, patient will work with PharmD and providers to maintain BP goal <130/80 . Current regimen:   Amlodipine 45m, 1 tablet once daily  Lisinopril 125m 1 tablet once daily  . Interventions: . Discussed diet modifications. DASH diet:  following a diet emphasizing fruits and vegetables and low-fat dairy products  along with whole grains, fish, poultry, and nuts. Reducing red meats and sugars.  . Patient self care activities - Over the next 180 days, patient will: o Check BP as instructed, document, and provide at future appointments  Hyperlipidemia Lab Results  Component Value Date/Time   LDLCALC 107 (H) 09/27/2018 09:23 AM   .  Pharmacist Clinical Goal(s): o Over the next 180 days, patient will work with PharmD and providers to achieve LDL goal < 100 . Current regimen:  o Pravastatin 92m, 1 tablet once daily . Interventions: o How to reduce cholesterol through diet/weight management and physical activity.    o We discussed how a diet high in plant sterols (fruits/vegetables/nuts/whole grains/legumes) may reduce your cholesterol.  Encouraged increasing fiber to a daily  . Patient self care activities - Over the next 180 days, patient will: o Continue current medications and continue lifestyle modifications (diet/ exercise).   Prediabetes Lab Results  Component Value Date/Time   HGBA1C 6.3 09/27/2018 09:23 AM   HGBA1C 6.2 09/06/2017 11:03 AM   . Pharmacist Clinical Goal(s): o Over the next 180 days, patient will work with PharmD and providers to maintain A1c goal <6.5% . Current regimen:  o No medications (lifestyle modifications only) . Patient self care activities - Over the next 180 days, patient will: o Check blood sugar as directed, document, and provide at future appointments  Polycythemia . Pharmacist Clinical Goal(s) o Over the next 180 days, patient will work with PharmD and providers to continue current medications and visits with oncologist.  . Current regimen:  o Aspirin 832m 1 tablet once daily  . Patient self care activities - Over the next 180 days, patient will: o Continue current medications and contact [facility] for blood donations.  GERD . Pharmacist Clinical Goal(s) o Over the next 180 days, patient will work with PharmD and providers to maintain/ minimize  heartburn symptoms.  . Current regimen:  o Pantoprazole 4034m tablet once daily  . Patient self care activities o Patient will continue current medications.   Gout . Pharmacist Clinical Goal(s) o Over the next 180 days, patient will work with PharmD and providers to prevent gout flare-ups . Current regimen:  o Allopurinol 100m91m tablet once daily  . Patient self care activities o Patient will continue current medications.   Medication management . Pharmacist Clinical Goal(s): o Over the next 180 days, patient will work with PharmD and providers to maintain optimal medication adherence . [facility] o Comprehensive medication review performed. o Continue current medication management strategy . Patient self care activities - Over the next 180 days, patient will: o Take medications as prescribed o Report any questions or concerns to PharmD and/or provider(s)  Initial goal documentation       Depression Screen PHQ 2/9 Scores 09/26/2019 09/26/2019 09/27/2018 09/06/2017 08/31/2016 04/22/2014 03/25/2013  PHQ - 2 Score 0 0 0 0 0 0 0  PHQ- 9 Score 0 - - - - - -    Fall Risk Fall Risk  09/26/2019 09/27/2018 09/06/2017 08/31/2016 09/27/2015  Falls in the past year? 0 0 No Yes No  Number falls in past yr: 0 0 - 1 -  Injury with Fall? 0 0 - - -  Risk for fall due to : Medication side effect - - - -  Follow up Falls evaluation completed;Falls prevention discussed Falls evaluation completed - - -    Any stairs in or around the home? No  If so, are there any without handrails? No  Home free of loose throw rugs in walkways, pet beds, electrical cords, etc? Yes  Adequate lighting in your home to reduce risk of falls? Yes   ASSISTIVE DEVICES UTILIZED TO PREVENT FALLS:  Life alert? No  Use of a cane, walker or w/c? No  Grab bars in the bathroom?  No  Shower chair or bench in shower? No  Elevated toilet seat or a handicapped toilet? No     Cognitive  Function:  Cognition not indicated       Immunizations Immunization History  Administered Date(s) Administered  . Fluad Quad(high Dose 65+) 08/30/2018  . Influenza Split 09/14/2011  . Influenza Whole 10/04/2006, 09/26/2007, 09/16/2008  . Influenza, High Dose Seasonal PF 09/20/2013, 09/25/2014, 10/11/2015, 08/31/2016, 09/06/2017  . Influenza,inj,Quad PF,6+ Mos 09/11/2012  . PFIZER SARS-COV-2 Vaccination 02/09/2019, 03/06/2019  . Pneumococcal Conjugate-13 10/03/2012, 07/18/2013, 04/22/2014  . Pneumococcal Polysaccharide-23 12/18/2006  . Tdap 01/17/2012  . Zoster 09/22/2010  . Zoster Recombinat (Shingrix) 05/29/2017, 11/09/2017    TDAP status: Up to date Flu Vaccine status: Up to date Pneumococcal vaccine status: Up to date Covid-19 vaccine status: Completed vaccines  Qualifies for Shingles Vaccine? Yes   Zostavax completed Yes   Shingrix Completed?: Yes  Screening Tests Health Maintenance  Topic Date Due  . COLONOSCOPY  07/16/2017  . INFLUENZA VACCINE  08/03/2019  . TETANUS/TDAP  01/16/2022  . COVID-19 Vaccine  Completed  . Hepatitis C Screening  Completed  . PNA vac Low Risk Adult  Completed    Health Maintenance  Health Maintenance Due  Topic Date Due  . COLONOSCOPY  07/16/2017  . INFLUENZA VACCINE  08/03/2019    Colorectal cancer screening: Completed 07/16/2012. Repeat every 5 years  Lung Cancer Screening: (Low Dose CT Chest recommended if Age 28-80 years, 30 pack-year currently smoking OR have quit w/in 15years.) does not qualify.   Lung Cancer Screening Referral: N/A  Additional Screening:  Hepatitis C Screening: does qualify; Completed 01/12/2015  Vision Screening: Recommended annual ophthalmology exams for early detection of glaucoma and other disorders of the eye. Is the patient up to date with their annual eye exam?  Yes  Who is the provider or what is the name of the office in which the patient attends annual eye exams? Dr. Delman Cheadle  If pt is not  established with a provider, would they like to be referred to a provider to establish care? No .   Dental Screening: Recommended annual dental exams for proper oral hygiene  Community Resource Referral / Chronic Care Management: CRR required this visit?  No   CCM required this visit?  No      Plan:     I have personally reviewed and noted the following in the patient's chart:   . Medical and social history . Use of alcohol, tobacco or illicit drugs  . Current medications and supplements . Functional ability and status . Nutritional status . Physical activity . Advanced directives . List of other physicians . Hospitalizations, surgeries, and ER visits in previous 12 months . Vitals . Screenings to include cognitive, depression, and falls . Referrals and appointments  In addition, I have reviewed and discussed with patient certain preventive protocols, quality metrics, and best practice recommendations. A written personalized care plan for preventive services as well as general preventive health recommendations were provided to patient.     Ofilia Neas, LPN   8/89/1694   Nurse Notes: None

## 2019-10-02 NOTE — Telephone Encounter (Signed)
-----   Message from Hulda Humphrey, [place] sent at 04/15/2019  7:04 AM EDT ----- Regarding: CPX Pt needs a cpx on/after 09/28/2019

## 2019-11-07 ENCOUNTER — Inpatient Hospital Stay: Payer: Medicare HMO | Attending: Hematology & Oncology

## 2019-11-07 ENCOUNTER — Inpatient Hospital Stay: Payer: Medicare HMO

## 2019-11-07 ENCOUNTER — Encounter: Payer: Self-pay | Admitting: Hematology & Oncology

## 2019-11-07 ENCOUNTER — Other Ambulatory Visit: Payer: Self-pay

## 2019-11-07 ENCOUNTER — Inpatient Hospital Stay (HOSPITAL_BASED_OUTPATIENT_CLINIC_OR_DEPARTMENT_OTHER): Payer: Medicare HMO | Admitting: Hematology & Oncology

## 2019-11-07 VITALS — BP 122/61 | HR 53 | Temp 98.6°F | Resp 19 | Wt 176.0 lb

## 2019-11-07 DIAGNOSIS — D751 Secondary polycythemia: Secondary | ICD-10-CM | POA: Insufficient documentation

## 2019-11-07 DIAGNOSIS — D45 Polycythemia vera: Secondary | ICD-10-CM

## 2019-11-07 DIAGNOSIS — Z7982 Long term (current) use of aspirin: Secondary | ICD-10-CM | POA: Insufficient documentation

## 2019-11-07 LAB — CBC WITH DIFFERENTIAL (CANCER CENTER ONLY)
Abs Immature Granulocytes: 0.04 10*3/uL (ref 0.00–0.07)
Basophils Absolute: 0.1 10*3/uL (ref 0.0–0.1)
Basophils Relative: 1 %
Eosinophils Absolute: 0.3 10*3/uL (ref 0.0–0.5)
Eosinophils Relative: 3 %
HCT: 43.5 % (ref 39.0–52.0)
Hemoglobin: 13.9 g/dL (ref 13.0–17.0)
Immature Granulocytes: 0 %
Lymphocytes Relative: 21 %
Lymphs Abs: 2.3 10*3/uL (ref 0.7–4.0)
MCH: 29.5 pg (ref 26.0–34.0)
MCHC: 32 g/dL (ref 30.0–36.0)
MCV: 92.4 fL (ref 80.0–100.0)
Monocytes Absolute: 1.3 10*3/uL — ABNORMAL HIGH (ref 0.1–1.0)
Monocytes Relative: 12 %
Neutro Abs: 6.7 10*3/uL (ref 1.7–7.7)
Neutrophils Relative %: 63 %
Platelet Count: 247 10*3/uL (ref 150–400)
RBC: 4.71 MIL/uL (ref 4.22–5.81)
RDW: 14.7 % (ref 11.5–15.5)
WBC Count: 10.8 10*3/uL — ABNORMAL HIGH (ref 4.0–10.5)
nRBC: 0 % (ref 0.0–0.2)

## 2019-11-07 LAB — CMP (CANCER CENTER ONLY)
ALT: 10 U/L (ref 0–44)
AST: 12 U/L — ABNORMAL LOW (ref 15–41)
Albumin: 4.2 g/dL (ref 3.5–5.0)
Alkaline Phosphatase: 70 U/L (ref 38–126)
Anion gap: 4 — ABNORMAL LOW (ref 5–15)
BUN: 25 mg/dL — ABNORMAL HIGH (ref 8–23)
CO2: 30 mmol/L (ref 22–32)
Calcium: 9.8 mg/dL (ref 8.9–10.3)
Chloride: 105 mmol/L (ref 98–111)
Creatinine: 1.3 mg/dL — ABNORMAL HIGH (ref 0.61–1.24)
GFR, Estimated: 56 mL/min — ABNORMAL LOW (ref >60)
Glucose, Bld: 126 mg/dL — ABNORMAL HIGH (ref 70–99)
Potassium: 4.6 mmol/L (ref 3.5–5.1)
Sodium: 139 mmol/L (ref 135–145)
Total Bilirubin: 0.6 mg/dL (ref 0.3–1.2)
Total Protein: 7 g/dL (ref 6.5–8.1)

## 2019-11-07 NOTE — Progress Notes (Signed)
Hematology and Oncology Follow Up Visit  Alexander [name] [id number] 04/17/1940 79 y.o. 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  Alexander [name] is in for a followup.  He is doing quite well.  Unfortunately, his wife is still not doing all that well.  She has a gastric ulcer.  There is still some bleeding from this.  He looks fantastic.  He has been staying in shape.  He and his wife do a lot of swimming over the summer.  Unfortunately, he he will still not be able to give blood to donate.  We thought that this would be a good idea for him.  Unfortunately, the [facility] donation center will not accept him.  He has had no cardiac issues.  He has had no palpitations.  There has been no chest pain.  His appetite is good.  He is watching what he eats very closely.  There has been no issues with nausea or vomiting.  He has had no leg swelling.  He has had no change in bowel or bladder habits.  His last iron studies showed a ferritin of 57 with an iron saturation of 19%.  Currently, his performance status is ECOG 1.    Medications:  Current Outpatient Medications:  .  ACCU-CHEK SOFTCLIX LANCETS lancets, Use to test blood glucose once daily, Disp: 100 each, Rfl: 3 .  allopurinol (ZYLOPRIM) 100 MG tablet, Take 1 tablet (100 mg total) by mouth daily., Disp: 360 tablet, Rfl: 0 .  amLODipine (NORVASC) 10 MG tablet, Take 1 tablet (10 mg total) by mouth daily., Disp: 90 tablet, Rfl: 3 .  aspirin EC 81 MG tablet, Take 1 tablet (81 mg total) by mouth daily., Disp: 90 tablet, Rfl: 3 .  Blood Glucose Monitoring Suppl (ACCU-CHEK AVIVA PLUS) w/Device KIT, Use to test blood glucose once daily, Disp: 1 kit, Rfl: 0 .  Coenzyme Q10 300 MG CAPS, Take 1 capsule by mouth daily. , Disp: , Rfl:  .  glucose blood (ACCU-CHEK AVIVA)  test strip, Use to test blood glucose once daily, Disp: 100 each, Rfl: 3 .  hydrocortisone (ANUSOL-HC) 25 MG suppository, Place 25 mg rectally daily as needed for hemorrhoids or anal itching., Disp: , Rfl:  .  lisinopril (ZESTRIL) 10 MG tablet, Take 1 tablet (10 mg total) by mouth daily., Disp: 90 tablet, Rfl: 3 .  pantoprazole (PROTONIX) 40 MG tablet, Take 1 tablet (40 mg total) by mouth daily., Disp: 360 tablet, Rfl: 0 .  pravastatin (PRAVACHOL) 40 MG tablet, Take 1 tablet (40 mg total) by mouth daily., Disp: 365 tablet, Rfl: 0 .  PREVIDENT 5000 ENAMEL PROTECT 1.1-5 % PSTE, See admin instructions., Disp: , Rfl:  .  PROCTO-MED HC 2.5 % rectal cream, APPLY 1 CREAM RECTALLY TWICE DAILY (Patient taking differently: APPLY 1 CREAM RECTALLY TWICE DAILY PRN), Disp: 90 g, Rfl: 3  Allergies:  Allergies  Allergen Reactions  . Metformin And Related Diarrhea    Past Medical History, Surgical history, Social history, and Family History were reviewed and updated.  Review of Systems: Review of Systems  Constitutional: Negative.   HENT: Negative.   Eyes: Negative.   Respiratory: Negative.   Cardiovascular: Negative.   Gastrointestinal: Negative.   Genitourinary: Negative.   Musculoskeletal: Negative.   Skin: Negative.   Neurological: Negative.   Endo/Heme/Allergies: Negative.  Psychiatric/Behavioral: Negative.     Physical Exam:  weight is 176 lb (79.8 kg). His oral temperature is 98.6 F (37 C). His blood pressure is 122/61 and his pulse is 53 (abnormal). His respiration is 19 and oxygen saturation is 100%.   Physical Exam Vitals reviewed.  HENT:     Head: Normocephalic and atraumatic.  Eyes:     Pupils: Pupils are equal, round, and reactive to light.  Cardiovascular:     Rate and Rhythm: Normal rate and regular rhythm.     Heart sounds: Normal heart sounds.  Pulmonary:     Effort: Pulmonary effort is normal.     Breath sounds: Normal breath sounds.  Abdominal:     General: Bowel  sounds are normal.     Palpations: Abdomen is soft.  Musculoskeletal:        General: No tenderness or deformity. Normal range of motion.     Cervical back: Normal range of motion.  Lymphadenopathy:     Cervical: No cervical adenopathy.  Skin:    General: Skin is warm and dry.     Findings: No erythema or rash.  Neurological:     Mental Status: He is alert and oriented to person, place, and time.  Psychiatric:        Behavior: Behavior normal.        Thought Content: Thought content normal.        Judgment: Judgment normal.      Lab Results  Component Value Date   WBC 10.8 (H) 11/07/2019   HGB 13.9 11/07/2019   HCT 43.5 11/07/2019   MCV 92.4 11/07/2019   PLT 247 11/07/2019     Chemistry      Component Value Date/Time   NA 139 11/07/2019 1154   NA 144 11/29/2016 1054   NA 139 12/29/2015 0954   K 4.6 11/07/2019 1154   K 4.2 11/29/2016 1054   K 4.1 12/29/2015 0954   CL 105 11/07/2019 1154   CL 102 11/29/2016 1054   CO2 30 11/07/2019 1154   CO2 30 11/29/2016 1054   CO2 26 12/29/2015 0954   BUN 25 (H) 11/07/2019 1154   BUN 22 11/29/2016 1054   BUN 20.0 12/29/2015 0954   CREATININE 1.30 (H) 11/07/2019 1154   CREATININE 1.2 11/29/2016 1054   CREATININE 1.3 12/29/2015 0954      Component Value Date/Time   CALCIUM 9.8 11/07/2019 1154   CALCIUM 9.3 11/29/2016 1054   CALCIUM 9.5 12/29/2015 0954   ALKPHOS 70 11/07/2019 1154   ALKPHOS 92 (H) 11/29/2016 1054   ALKPHOS 98 12/29/2015 0954   AST 12 (L) 11/07/2019 1154   AST 16 12/29/2015 0954   ALT 10 11/07/2019 1154   ALT 20 11/29/2016 1054   ALT 13 12/29/2015 0954   BILITOT 0.6 11/07/2019 1154   BILITOT 0.71 12/29/2015 0954      Impression and Plan: [name] is 79 year old gentleman. He has secondary polycythemia/erythrocytosis. He is JAK2 negative.   I am happy that we do not have to do anything with him right now.  He just amazed me as to how energetic he is.  He has always been this way.  We will plan  to get Mr. Myriam Forehand back in 3 more months.      [name], MD 11/5/202112:44 PM

## 2019-11-10 ENCOUNTER — Telehealth: Payer: Self-pay | Admitting: Hematology & Oncology

## 2019-11-10 LAB — IRON AND TIBC
Iron: 108 ug/dL (ref 42–163)
Saturation Ratios: 31 % (ref 20–55)
TIBC: 344 ug/dL (ref 202–409)
UIBC: 236 ug/dL (ref 117–376)

## 2019-11-10 LAB — FERRITIN: Ferritin: 30 ng/mL (ref 24–336)

## 2019-11-10 NOTE — Telephone Encounter (Signed)
Appointments scheduled calendar printed & mailed per 11/5 los 

## 2019-11-19 ENCOUNTER — Other Ambulatory Visit: Payer: Self-pay

## 2019-11-19 ENCOUNTER — Ambulatory Visit (INDEPENDENT_AMBULATORY_CARE_PROVIDER_SITE_OTHER): Payer: Medicare HMO | Admitting: *Deleted

## 2019-11-19 DIAGNOSIS — Z23 Encounter for immunization: Secondary | ICD-10-CM

## 2019-12-09 ENCOUNTER — Encounter: Payer: Self-pay | Admitting: Cardiology

## 2019-12-09 ENCOUNTER — Other Ambulatory Visit: Payer: Self-pay

## 2019-12-09 ENCOUNTER — Ambulatory Visit (INDEPENDENT_AMBULATORY_CARE_PROVIDER_SITE_OTHER): Payer: Medicare HMO | Admitting: Cardiology

## 2019-12-09 VITALS — BP 140/78 | HR 56 | Ht 71.0 in | Wt 179.0 lb

## 2019-12-09 DIAGNOSIS — I2583 Coronary atherosclerosis due to lipid rich plaque: Secondary | ICD-10-CM

## 2019-12-09 DIAGNOSIS — I10 Essential (primary) hypertension: Secondary | ICD-10-CM | POA: Diagnosis not present

## 2019-12-09 DIAGNOSIS — I2510 Atherosclerotic heart disease of native coronary artery without angina pectoris: Secondary | ICD-10-CM | POA: Diagnosis not present

## 2019-12-09 DIAGNOSIS — D45 Polycythemia vera: Secondary | ICD-10-CM | POA: Diagnosis not present

## 2019-12-09 NOTE — Patient Instructions (Signed)
Medication Instructions:  The current medical regimen is effective;  continue present plan and medications.  *If you need a refill on your cardiac medications before your next appointment, please call your pharmacy*  Follow-Up: At [facility], you and your health needs are our priority.  As part of our continuing mission to provide you with exceptional heart care, we have created designated Provider Care Teams.  These Care Teams include your primary Cardiologist (physician) and Advanced Practice Providers (APPs -  Physician Assistants and Nurse Practitioners) who all work together to provide you with the care you need, when you need it.  We recommend signing up for the patient portal called "MyChart".  Sign up information is provided on this After Visit Summary.  MyChart is used to connect with patients for Virtual Visits (Telemedicine).  Patients are able to view lab/test results, encounter notes, upcoming appointments, etc.  Non-urgent messages can be sent to your provider as well.   To learn more about what you can do with MyChart, go to [email or web address].    Your next appointment:   12 month(s)  The format for your next appointment:   In Person  Provider:   Mark Skains, MD   Thank you for choosing [facility]!!      

## 2019-12-09 NOTE — Progress Notes (Signed)
Cardiology Office Note:    Date:  12/09/2019   ID:  Alexander [name], DOB [birth year], MRN [id number]  PCP:  [name], NP  [facility] Cardiologist:  Candee Furbish, MD  [facility] Electrophysiologist:  None   Referring MD: [name], NP     History of Present Illness:    Alexander [name] is a 79 y.o. male here for the follow-up of coronary artery disease.  Cardiac catheterization 2015: Showed no significant obstructive disease, 30% left main plaque. Echocardiogram 2010-EF 65% Nuclear stress test 2014-normal study  Previously enjoying swimming in his pool, saline pool, in the summertime.  Travels to [place] prior to [employer] yearly.  His daughter is there.  Prior event monitor showed no atrial fibrillation.  He does have occasional easy bruising with aspirin.  No anginal symptoms.  Doing great.  Very active.  No bleeding.  His wife, [name], stopped her Nexium and because she was taking NSAIDs, developed peptic ulcer disease.  Bleeding ulcer.  Past Medical History:  Diagnosis Date  . Arthritis    "minor; shoulders primarily" (05/20/2013)  . CAD (coronary artery disease)    a. 05/2013 - Chest pain/unstable angina and dynamic EKG changes showing cath showing atherosclerotic coronary artery disease manifested as diffuse ectasia, normal EF.  . Diverticulosis of colon   . GERD (gastroesophageal reflux disease)   . GI bleed    secondary to Aspirin  . Gout   . Hematuria, microscopic   . Hemorrhoids   . Hyperglycemia   . Hyperlipidemia   . Hypertension   . Lumbar back pain   . Microalbuminuria   . Overweight(278.02)   . Polycythemia rubra vera ([place])   . Tubular adenoma of colon 07/2012    Past Surgical History:  Procedure Laterality Date  . CARDIAC CATHETERIZATION  05/20/2013  . CATARACT EXTRACTION W/ INTRAOCULAR LENS  IMPLANT, BILATERAL Bilateral 2008  . CYSTECTOMY  ~ 2000   " SEBACEOUS cyst removed from between shoulder blades"  . EXCISIONAL  HEMORRHOIDECTOMY  1970's  . INGUINAL HERNIA REPAIR Right ~ 2010  . LEFT HEART CATHETERIZATION WITH CORONARY ANGIOGRAM N/A 05/20/2013   Procedure: LEFT HEART CATHETERIZATION WITH CORONARY ANGIOGRAM;  Surgeon: Peter M Martinique, MD;  Location: [facility] CATH LAB;  Service: Cardiovascular;  Laterality: N/A;  . TONSILLECTOMY AND ADENOIDECTOMY  1940's  . VASECTOMY      Current Medications: Current Meds  Medication Sig  . ACCU-CHEK SOFTCLIX LANCETS lancets Use to test blood glucose once daily  . allopurinol (ZYLOPRIM) 100 MG tablet Take 1 tablet (100 mg total) by mouth daily.  [name] amLODipine (NORVASC) 10 MG tablet Take 1 tablet (10 mg total) by mouth daily.  [name] aspirin EC 81 MG tablet Take 1 tablet (81 mg total) by mouth daily.  . Blood Glucose Monitoring Suppl (ACCU-CHEK AVIVA PLUS) w/Device KIT Use to test blood glucose once daily  . Coenzyme Q10 300 MG CAPS Take 1 capsule by mouth daily.   [name] glucose blood (ACCU-CHEK AVIVA) test strip Use to test blood glucose once daily  . hydrocortisone (ANUSOL-HC) 25 MG suppository Place 25 mg rectally daily as needed for hemorrhoids or anal itching.  [name] lisinopril (ZESTRIL) 10 MG tablet Take 1 tablet (10 mg total) by mouth daily.  . pantoprazole (PROTONIX) 40 MG tablet Take 1 tablet (40 mg total) by mouth daily.  . pravastatin (PRAVACHOL) 40 MG tablet Take 1 tablet (40 mg total) by mouth daily.  [name] PREVIDENT 5000 ENAMEL PROTECT 1.1-5 % PSTE See admin instructions.  [name]  PROCTO-MED HC 2.5 % rectal cream APPLY 1 CREAM RECTALLY TWICE DAILY     Allergies:   Metformin and related   Social History   Socioeconomic History  . Marital status: Married    Spouse name: Not on file  . Number of children: 3  . Years of education: Not on file  . Highest education level: Not on file  Occupational History  . Occupation: retired Lobbyist: RETIRED  Tobacco Use  . Smoking status: Never Smoker  . Smokeless tobacco: Never Used  . Tobacco comment: NEVER USED TOBACCO   Vaping Use  . Vaping Use: Never used  Substance and Sexual Activity  . Alcohol use: No    Alcohol/week: 0.0 standard drinks  . Drug use: No  . Sexual activity: Yes    Partners: Female  Other Topics Concern  . Not on file  Social History Narrative   Marriedfor 50 years    Daughter, Alexander [name] (pediatrician--lives in [place], [place]), one in [place] - Engineer, water. One in [place] - Chief Strategy Officer          Social Determinants of Health   Financial Resource Strain: Low Risk   . Difficulty of Paying Living Expenses: Not hard at all  Food Insecurity: No Food Insecurity  . Worried About Charity fundraiser in the Last Year: Never true  . Ran Out of Food in the Last Year: Never true  Transportation Needs: No Transportation Needs  . Lack of Transportation (Medical): No  . Lack of Transportation (Non-Medical): No  Physical Activity: Sufficiently Active  . Days of Exercise per Week: 7 days  . Minutes of Exercise per Session: 60 min  Stress: No Stress Concern Present  . Feeling of Stress : Not at all  Social Connections: Moderately Integrated  . Frequency of Communication with Friends and Family: More than three times a week  . Frequency of Social Gatherings with Friends and Family: Twice a week  . Attends Religious Services: More than 4 times per year  . Active Member of Clubs or Organizations: No  . Attends Archivist Meetings: Never  . Marital Status: Married     Family History: The patient's family history includes Cancer in his maternal uncle; Colon cancer in his mother; Diabetes in his father and mother; Heart disease (age of onset: 65) in his father; Stomach cancer in his maternal grandfather.  ROS:   Please see the history of present illness.    No fevers chills nausea vomiting syncope bleeding all other systems reviewed and are negative.  EKGs/Labs/Other Studies Reviewed:    The following studies were reviewed today: As above  EKG:  EKG is  ordered today.  The ekg ordered  today demonstrates SR/sinus bradycardia 56 bpm no other abnormalities  Recent Labs: 11/07/2019: ALT 10; BUN 25; Creatinine 1.30; Hemoglobin 13.9; Platelet Count 247; Potassium 4.6; Sodium 139  Recent Lipid Panel    Component Value Date/Time   CHOL 168 09/27/2018 0923   TRIG 68.0 09/27/2018 0923   HDL 47.00 09/27/2018 0923   CHOLHDL 4 09/27/2018 0923   VLDL 13.6 09/27/2018 0923   LDLCALC 107 (H) 09/27/2018 0923     Risk Assessment/Calculations:       Physical Exam:    VS:  BP 140/78 (BP Location: Left Arm, Patient Position: Sitting, Cuff Size: Normal)   Pulse (!) 56   Ht _0  (1.803 m)   Wt 179 lb (81.2 kg)   SpO2 98%   BMI 24.97 kg/m  Wt Readings from Last 3 Encounters:  12/09/19 179 lb (81.2 kg)  11/07/19 176 lb (79.8 kg)  07/25/19 173 lb 0.3 oz (78.5 kg)     GEN:  Well nourished, well developed in no acute distress HEENT: Normal NECK: No JVD; No carotid bruits LYMPHATICS: No lymphadenopathy CARDIAC: RRR, no murmurs, rubs, gallops RESPIRATORY:  Clear to auscultation without rales, wheezing or rhonchi  ABDOMEN: Soft, non-tender, non-distended MUSCULOSKELETAL:  No edema; No deformity  SKIN: Warm and dry, occasional ecchymoses NEUROLOGIC:  Alert and oriented x 3 PSYCHIATRIC:  Normal affect   ASSESSMENT:    1. Coronary artery disease due to lipid rich plaque   2. Essential hypertension   3. Polycythemia vera ([place])    PLAN:    In order of problems listed above:  Coronary artery disease -Previously nonobstructive disease, 30% left main.  Continue with low-dose aspirin secondary risk factor prevention.  Statin, hypertensive therapy.  Would ultimately like his LDL less than 70.  Last LDL is 107 with HDL of 47 total cholesterol 168.  Polycythemia rubra vera -Hematology has been following.  Continuing with aspirin. -Creatinine 1.3 stable.  Hemoglobin 13.9 stable.  Platelet count 247.  Moved from Dr. Marin Olp reviewed. He is JAK2 negative.    Hypertension/hyperlipidemia -Has been on both lisinopril and amlodipine as well as pravastatin.  Stable.  Uses good Rx.  Bradycardia -Staying off beta-blockers.  Asymptomatic.    Shared Decision Making/Informed Consent        Medication Adjustments/Labs and Tests Ordered: Current medicines are reviewed at length with the patient today.  Concerns regarding medicines are outlined above.  Orders Placed This Encounter  Procedures  . EKG 12-Lead   No orders of the defined types were placed in this encounter.   Patient Instructions  Medication Instructions:  The current medical regimen is effective;  continue present plan and medications.  *If you need a refill on your cardiac medications before your next appointment, please call your pharmacy*  Follow-Up: At [facility], you and your health needs are our priority.  As part of our continuing mission to provide you with exceptional heart care, we have created designated Provider Care Teams.  These Care Teams include your primary Cardiologist (physician) and Advanced Practice Providers (APPs -  Physician Assistants and Nurse Practitioners) who all work together to provide you with the care you need, when you need it.  We recommend signing up for the patient portal called "MyChart".  Sign up information is provided on this After Visit Summary.  MyChart is used to connect with patients for Virtual Visits (Telemedicine).  Patients are able to view lab/test results, encounter notes, upcoming appointments, etc.  Non-urgent messages can be sent to your provider as well.   To learn more about what you can do with MyChart, go to [email or web address].    Your next appointment:   12 month(s)  The format for your next appointment:   In Person  Provider:   Candee Furbish, MD  Thank you for choosing [facility]!!        Signed, Candee Furbish, MD  12/09/2019 1:49 PM    [place]

## 2019-12-24 ENCOUNTER — Encounter: Payer: Self-pay | Admitting: Adult Health

## 2019-12-26 ENCOUNTER — Encounter: Payer: Self-pay | Admitting: Adult Health

## 2020-02-05 ENCOUNTER — Encounter: Payer: Self-pay | Admitting: Adult Health

## 2020-02-05 ENCOUNTER — Other Ambulatory Visit: Payer: Self-pay

## 2020-02-05 ENCOUNTER — Inpatient Hospital Stay: Payer: Medicare HMO

## 2020-02-05 ENCOUNTER — Inpatient Hospital Stay: Payer: Medicare HMO | Attending: Hematology & Oncology

## 2020-02-05 ENCOUNTER — Inpatient Hospital Stay (HOSPITAL_BASED_OUTPATIENT_CLINIC_OR_DEPARTMENT_OTHER): Payer: Medicare HMO | Admitting: Hematology & Oncology

## 2020-02-05 ENCOUNTER — Encounter: Payer: Self-pay | Admitting: Hematology & Oncology

## 2020-02-05 VITALS — BP 117/72 | HR 62 | Temp 97.8°F | Resp 20 | Wt 180.8 lb

## 2020-02-05 DIAGNOSIS — D751 Secondary polycythemia: Secondary | ICD-10-CM | POA: Diagnosis present

## 2020-02-05 DIAGNOSIS — D45 Polycythemia vera: Secondary | ICD-10-CM

## 2020-02-05 DIAGNOSIS — Z79899 Other long term (current) drug therapy: Secondary | ICD-10-CM | POA: Diagnosis not present

## 2020-02-05 DIAGNOSIS — Z8616 Personal history of COVID-19: Secondary | ICD-10-CM | POA: Diagnosis not present

## 2020-02-05 DIAGNOSIS — Z862 Personal history of diseases of the blood and blood-forming organs and certain disorders involving the immune mechanism: Secondary | ICD-10-CM | POA: Diagnosis not present

## 2020-02-05 LAB — CMP (CANCER CENTER ONLY)
ALT: 12 U/L (ref 0–44)
AST: 13 U/L — ABNORMAL LOW (ref 15–41)
Albumin: 4.3 g/dL (ref 3.5–5.0)
Alkaline Phosphatase: 73 U/L (ref 38–126)
Anion gap: 4 — ABNORMAL LOW (ref 5–15)
BUN: 25 mg/dL — ABNORMAL HIGH (ref 8–23)
CO2: 31 mmol/L (ref 22–32)
Calcium: 10 mg/dL (ref 8.9–10.3)
Chloride: 103 mmol/L (ref 98–111)
Creatinine: 1.33 mg/dL — ABNORMAL HIGH (ref 0.61–1.24)
GFR, Estimated: 54 mL/min — ABNORMAL LOW (ref >60)
Glucose, Bld: 105 mg/dL — ABNORMAL HIGH (ref 70–99)
Potassium: 4.7 mmol/L (ref 3.5–5.1)
Sodium: 138 mmol/L (ref 135–145)
Total Bilirubin: 0.7 mg/dL (ref 0.3–1.2)
Total Protein: 7 g/dL (ref 6.5–8.1)

## 2020-02-05 LAB — CBC WITH DIFFERENTIAL (CANCER CENTER ONLY)
Abs Immature Granulocytes: 0.04 10*3/uL (ref 0.00–0.07)
Basophils Absolute: 0.1 10*3/uL (ref 0.0–0.1)
Basophils Relative: 1 %
Eosinophils Absolute: 0.4 10*3/uL (ref 0.0–0.5)
Eosinophils Relative: 4 %
HCT: 45.1 % (ref 39.0–52.0)
Hemoglobin: 14.9 g/dL (ref 13.0–17.0)
Immature Granulocytes: 0 %
Lymphocytes Relative: 26 %
Lymphs Abs: 2.5 10*3/uL (ref 0.7–4.0)
MCH: 30.3 pg (ref 26.0–34.0)
MCHC: 33 g/dL (ref 30.0–36.0)
MCV: 91.7 fL (ref 80.0–100.0)
Monocytes Absolute: 1.3 10*3/uL — ABNORMAL HIGH (ref 0.1–1.0)
Monocytes Relative: 14 %
Neutro Abs: 5.3 10*3/uL (ref 1.7–7.7)
Neutrophils Relative %: 55 %
Platelet Count: 245 10*3/uL (ref 150–400)
RBC: 4.92 MIL/uL (ref 4.22–5.81)
RDW: 16.3 % — ABNORMAL HIGH (ref 11.5–15.5)
WBC Count: 9.5 10*3/uL (ref 4.0–10.5)
nRBC: 0 % (ref 0.0–0.2)

## 2020-02-05 NOTE — Progress Notes (Signed)
Hematology and Oncology Follow Up Visit  Alexander [name] [id number] 05/31/1940 79 y.o. 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 is in for a followup.  He, like everybody else, had Covid.  He only had a minor symptoms.  He had these for 3 days.  He did not need any monoclonal antibody therapy.  Thankfully, Alexander [name] did not get the COVID.  He is doing well.  He just wishes he could go swimming.  Alexander last iron studies back in November showed a ferritin of 30 with an iron saturation of 31%.  He has had no problems with bowels or bladder.  He has had no nausea or vomiting.  He has had no cardiac issues.  He does not have to see the cardiologist for another year.  There has been no problems with rashes.  He has had no leg swelling.  Alexander 80th birthday is coming up.  I suspect he will have family coming in from all over the world to celebrate.  Currently, Alexander performance status is ECOG 1.    Medications:  Current Outpatient Medications:  .  ACCU-CHEK SOFTCLIX LANCETS lancets, Use to test blood glucose once daily, Disp: 100 each, Rfl: 3 .  allopurinol (ZYLOPRIM) 100 MG tablet, Take 1 tablet (100 mg total) by mouth daily., Disp: 360 tablet, Rfl: 0 .  amLODipine (NORVASC) 10 MG tablet, Take 1 tablet (10 mg total) by mouth daily., Disp: 90 tablet, Rfl: 3 .  aspirin EC 81 MG tablet, Take 1 tablet (81 mg total) by mouth daily., Disp: 90 tablet, Rfl: 3 .  Blood Glucose Monitoring Suppl (ACCU-CHEK AVIVA PLUS) w/Device KIT, Use to test blood glucose once daily, Disp: 1 kit, Rfl: 0 .  Coenzyme Q10 300 MG CAPS, Take 1 capsule by mouth daily. , Disp: , Rfl:  .  glucose blood (ACCU-CHEK AVIVA) test strip, Use to test blood glucose once daily, Disp: 100 each, Rfl: 3 .  hydrocortisone (ANUSOL-HC) 25 MG  suppository, Place 25 mg rectally daily as needed for hemorrhoids or anal itching., Disp: , Rfl:  .  lisinopril (ZESTRIL) 10 MG tablet, Take 1 tablet (10 mg total) by mouth daily., Disp: 90 tablet, Rfl: 3 .  pantoprazole (PROTONIX) 40 MG tablet, Take 1 tablet (40 mg total) by mouth daily., Disp: 360 tablet, Rfl: 0 .  pravastatin (PRAVACHOL) 40 MG tablet, Take 1 tablet (40 mg total) by mouth daily., Disp: 365 tablet, Rfl: 0 .  PREVIDENT 5000 ENAMEL PROTECT 1.1-5 % PSTE, See admin instructions., Disp: , Rfl:  .  PROCTO-MED HC 2.5 % rectal cream, APPLY 1 CREAM RECTALLY TWICE DAILY, Disp: 90 g, Rfl: 3  Allergies:  Allergies  Allergen Reactions  . Metformin And Related Diarrhea    Past Medical History, Surgical history, Social history, and Family History were reviewed and updated.  Review of Systems: Review of Systems  Constitutional: Negative.   HENT: Negative.   Eyes: Negative.   Respiratory: Negative.   Cardiovascular: Negative.   Gastrointestinal: Negative.   Genitourinary: Negative.   Musculoskeletal: Negative.   Skin: Negative.   Neurological: Negative.   Endo/Heme/Allergies: Negative.   Psychiatric/Behavioral: Negative.     Physical Exam:  weight is 180 lb 12.8 oz (82 kg). Alexander oral temperature is 97.8 F (36.6 C). Alexander blood pressure is 117/72 and Alexander   pulse is 62. Alexander respiration is 20 and oxygen saturation is 100%.   Physical Exam Vitals reviewed.  HENT:     Head: Normocephalic and atraumatic.  Eyes:     Pupils: Pupils are equal, round, and reactive to light.  Cardiovascular:     Rate and Rhythm: Normal rate and regular rhythm.     Heart sounds: Normal heart sounds.  Pulmonary:     Effort: Pulmonary effort is normal.     Breath sounds: Normal breath sounds.  Abdominal:     General: Bowel sounds are normal.     Palpations: Abdomen is soft.  Musculoskeletal:        General: No tenderness or deformity. Normal range of motion.     Cervical back: Normal range of  motion.  Lymphadenopathy:     Cervical: No cervical adenopathy.  Skin:    General: Skin is warm and dry.     Findings: No erythema or rash.  Neurological:     Mental Status: He is alert and oriented to person, place, and time.  Psychiatric:        Behavior: Behavior normal.        Thought Content: Thought content normal.        Judgment: Judgment normal.      Lab Results  Component Value Date   WBC 9.5 02/05/2020   HGB 14.9 02/05/2020   HCT 45.1 02/05/2020   MCV 91.7 02/05/2020   PLT 245 02/05/2020     Chemistry      Component Value Date/Time   NA 139 11/07/2019 1154   NA 144 11/29/2016 1054   NA 139 12/29/2015 0954   K 4.6 11/07/2019 1154   K 4.2 11/29/2016 1054   K 4.1 12/29/2015 0954   CL 105 11/07/2019 1154   CL 102 11/29/2016 1054   CO2 30 11/07/2019 1154   CO2 30 11/29/2016 1054   CO2 26 12/29/2015 0954   BUN 25 (H) 11/07/2019 1154   BUN 22 11/29/2016 1054   BUN 20.0 12/29/2015 0954   CREATININE 1.30 (H) 11/07/2019 1154   CREATININE 1.2 11/29/2016 1054   CREATININE 1.3 12/29/2015 0954      Component Value Date/Time   CALCIUM 9.8 11/07/2019 1154   CALCIUM 9.3 11/29/2016 1054   CALCIUM 9.5 12/29/2015 0954   ALKPHOS 70 11/07/2019 1154   ALKPHOS 92 (H) 11/29/2016 1054   ALKPHOS 98 12/29/2015 0954   AST 12 (L) 11/07/2019 1154   AST 16 12/29/2015 0954   ALT 10 11/07/2019 1154   ALT 20 11/29/2016 1054   ALT 13 12/29/2015 0954   BILITOT 0.6 11/07/2019 1154   BILITOT 0.71 12/29/2015 0954      Impression and Plan: Alexander [name] is 80-year-old gentleman. He has secondary polycythemia/erythrocytosis. He is JAK2 negative.   I am happy that we do not have to do anything with him right now.  I am just amazed me as to how energetic he is.  He has always been this way.  Again, he certainly does not look Alexander age.  We will plan to get Alexander [name] back in 3 more months.       R , MD 2/3/202212:06 PM  

## 2020-02-06 LAB — FERRITIN: Ferritin: 36 ng/mL (ref 24–336)

## 2020-02-06 LAB — IRON AND TIBC
Iron: 112 ug/dL (ref 42–163)
Saturation Ratios: 33 % (ref 20–55)
TIBC: 343 ug/dL (ref 202–409)
UIBC: 231 ug/dL (ref 117–376)

## 2020-02-20 ENCOUNTER — Encounter: Payer: Self-pay | Admitting: Adult Health

## 2020-03-11 ENCOUNTER — Other Ambulatory Visit: Payer: Self-pay

## 2020-03-12 ENCOUNTER — Ambulatory Visit (INDEPENDENT_AMBULATORY_CARE_PROVIDER_SITE_OTHER): Payer: Medicare HMO | Admitting: Adult Health

## 2020-03-12 ENCOUNTER — Encounter: Payer: Self-pay | Admitting: Adult Health

## 2020-03-12 VITALS — BP 130/80 | HR 63 | Temp 98.1°F | Ht 71.0 in | Wt 179.0 lb

## 2020-03-12 DIAGNOSIS — I10 Essential (primary) hypertension: Secondary | ICD-10-CM | POA: Diagnosis not present

## 2020-03-12 DIAGNOSIS — N400 Enlarged prostate without lower urinary tract symptoms: Secondary | ICD-10-CM

## 2020-03-12 DIAGNOSIS — E785 Hyperlipidemia, unspecified: Secondary | ICD-10-CM

## 2020-03-12 DIAGNOSIS — K219 Gastro-esophageal reflux disease without esophagitis: Secondary | ICD-10-CM

## 2020-03-12 DIAGNOSIS — E7439 Other disorders of intestinal carbohydrate absorption: Secondary | ICD-10-CM

## 2020-03-12 DIAGNOSIS — Z0000 Encounter for general adult medical examination without abnormal findings: Secondary | ICD-10-CM

## 2020-03-12 DIAGNOSIS — M1000 Idiopathic gout, unspecified site: Secondary | ICD-10-CM

## 2020-03-12 DIAGNOSIS — D45 Polycythemia vera: Secondary | ICD-10-CM

## 2020-03-12 LAB — COMPREHENSIVE METABOLIC PANEL
ALT: 12 U/L (ref 0–53)
AST: 14 U/L (ref 0–37)
Albumin: 4.2 g/dL (ref 3.5–5.2)
Alkaline Phosphatase: 77 U/L (ref 39–117)
BUN: 18 mg/dL (ref 6–23)
CO2: 28 mEq/L (ref 19–32)
Calcium: 9.6 mg/dL (ref 8.4–10.5)
Chloride: 104 mEq/L (ref 96–112)
Creatinine, Ser: 1.21 mg/dL (ref 0.40–1.50)
GFR: 56.73 mL/min — ABNORMAL LOW (ref >60.00)
Glucose, Bld: 110 mg/dL — ABNORMAL HIGH (ref 70–99)
Potassium: 4.7 mEq/L (ref 3.5–5.1)
Sodium: 139 mEq/L (ref 135–145)
Total Bilirubin: 1.2 mg/dL (ref 0.2–1.2)
Total Protein: 7 g/dL (ref 6.0–8.3)

## 2020-03-12 LAB — LIPID PANEL
Cholesterol: 152 mg/dL (ref 0–200)
HDL: 46 mg/dL (ref >39.00)
LDL Cholesterol: 88 mg/dL (ref 0–99)
NonHDL: 105.97
Total CHOL/HDL Ratio: 3
Triglycerides: 89 mg/dL (ref 0.0–149.0)
VLDL: 17.8 mg/dL (ref 0.0–40.0)

## 2020-03-12 LAB — CBC WITH DIFFERENTIAL/PLATELET
Basophils Absolute: 0.1 10*3/uL (ref 0.0–0.1)
Basophils Relative: 0.8 % (ref 0.0–3.0)
Eosinophils Absolute: 0.3 10*3/uL (ref 0.0–0.7)
Eosinophils Relative: 3.5 % (ref 0.0–5.0)
HCT: 43.3 % (ref 39.0–52.0)
Hemoglobin: 14.5 g/dL (ref 13.0–17.0)
Lymphocytes Relative: 21 % (ref 12.0–46.0)
Lymphs Abs: 1.7 10*3/uL (ref 0.7–4.0)
MCHC: 33.5 g/dL (ref 30.0–36.0)
MCV: 93 fl (ref 78.0–100.0)
Monocytes Absolute: 1 10*3/uL (ref 0.1–1.0)
Monocytes Relative: 12.5 % — ABNORMAL HIGH (ref 3.0–12.0)
Neutro Abs: 5.2 10*3/uL (ref 1.4–7.7)
Neutrophils Relative %: 62.2 % (ref 43.0–77.0)
Platelets: 241 10*3/uL (ref 150.0–400.0)
RBC: 4.66 Mil/uL (ref 4.22–5.81)
RDW: 16.1 % — ABNORMAL HIGH (ref 11.5–15.5)
WBC: 8.3 10*3/uL (ref 4.0–10.5)

## 2020-03-12 LAB — TSH: TSH: 0.75 u[IU]/mL (ref 0.35–4.50)

## 2020-03-12 LAB — HEMOGLOBIN A1C: Hgb A1c MFr Bld: 6.2 % (ref 4.6–6.5)

## 2020-03-12 LAB — PSA: PSA: 0.53 ng/mL (ref 0.10–4.00)

## 2020-03-12 MED ORDER — AMLODIPINE BESYLATE 10 MG PO TABS: 10.0000 mg | ORAL_TABLET | Freq: Every day | ORAL | 3 refills | Status: DC

## 2020-03-12 MED ORDER — LISINOPRIL 10 MG PO TABS: 10.0000 mg | ORAL_TABLET | Freq: Every day | ORAL | 3 refills | Status: DC

## 2020-03-12 MED ORDER — ALLOPURINOL 100 MG PO TABS
100.0000 mg | ORAL_TABLET | Freq: Every day | ORAL | 0 refills | Status: DC
Start: 2020-03-12 — End: 2021-03-03

## 2020-03-12 MED ORDER — PRAVASTATIN SODIUM 40 MG PO TABS: 40.0000 mg | ORAL_TABLET | Freq: Every day | ORAL | 0 refills | Status: DC

## 2020-03-12 MED ORDER — PANTOPRAZOLE SODIUM 40 MG PO TBEC
40.0000 mg | DELAYED_RELEASE_TABLET | Freq: Every day | ORAL | 0 refills | Status: DC
Start: 2020-03-12 — End: 2021-03-03

## 2020-03-12 NOTE — Progress Notes (Signed)
Subjective:    Patient ID: Alexander [name], male    DOB: 1940/11/02, 80 y.o.   MRN: [id number]  HPI Patient presents for yearly preventative medicine examination. He is a pleasant and remarkable  80 year old male who  has a past medical history of Arthritis, CAD (coronary artery disease), Diverticulosis of colon, GERD (gastroesophageal reflux disease), GI bleed, Gout, Hematuria, microscopic, Hemorrhoids, Hyperglycemia, Hyperlipidemia, Hypertension, Lumbar back pain, Microalbuminuria, Overweight(278.02), Polycythemia rubra vera ([place]), and Tubular adenoma of colon (07/2012).  Essential Hypertension -takes lisinopril 10 mg and Norvasc 10 mg.  He denies dizziness, lightheadedness, chest pain, shortness of breath, or syncopal episodes BP Readings from Last 3 Encounters:  03/12/20 130/80  02/05/20 117/72  12/09/19 140/78   Hyperlipidemia -controlled with pravastatin 40 mg and heart healthy diet.  He denies myalgia or fatigue Lab Results  Component Value Date   CHOL 168 09/27/2018   HDL 47.00 09/27/2018   LDLCALC 107 (H) 09/27/2018   TRIG 68.0 09/27/2018   CHOLHDL 4 09/27/2018   GERD- Takes Protonix 40 mg daily. Feels well controlled on this medication   Gout - Takes Allopurinol 100 mg daily. He denies recent gout flares  Polycythemia Vera - JAK2 Negative. Is followed by Dr. Marin Olp routinely.   Glucose Intolerance - diet controlled.  Lab Results  Component Value Date   HGBA1C 6.3 09/27/2018   All immunizations and health maintenance protocols were reviewed with the patient and needed orders were placed.  Appropriate screening laboratory values were ordered for the patient including screening of hyperlipidemia, renal function and hepatic function. If indicated by BPH, a PSA was ordered.  Medication reconciliation,  past medical history, social history, problem list and allergies were reviewed in detail with the patient  Goals were established with regard to weight loss,  exercise, and  diet in compliance with medications.  Eats a heart healthy diet, is excited to open his pool so he can start back swimming.   Wt Readings from Last 3 Encounters:  03/12/20 179 lb (81.2 kg)  02/05/20 180 lb 12.8 oz (82 kg)  12/09/19 179 lb (81.2 kg)   Review of Systems  Constitutional: Negative.   HENT: Negative.   Eyes: Negative.   Respiratory: Negative.   Cardiovascular: Negative.   Gastrointestinal: Negative.   Endocrine: Negative.   Genitourinary: Negative.   Musculoskeletal: Negative.   Skin: Negative.   Allergic/Immunologic: Negative.   Neurological: Negative.   Hematological: Negative.   Psychiatric/Behavioral: Negative.   All other systems reviewed and are negative.    Past Medical History:  Diagnosis Date  . Arthritis    "minor; shoulders primarily" (05/20/2013)  . CAD (coronary artery disease)    a. 05/2013 - Chest pain/unstable angina and dynamic EKG changes showing cath showing atherosclerotic coronary artery disease manifested as diffuse ectasia, normal EF.  . Diverticulosis of colon   . GERD (gastroesophageal reflux disease)   . GI bleed    secondary to Aspirin  . Gout   . Hematuria, microscopic   . Hemorrhoids   . Hyperglycemia   . Hyperlipidemia   . Hypertension   . Lumbar back pain   . Microalbuminuria   . Overweight(278.02)   . Polycythemia rubra vera ([place])   . Tubular adenoma of colon 07/2012    Social History   Socioeconomic History  . Marital status: Married    Spouse name: Not on file  . Number of children: 3  . Years of education: Not on file  . Highest education level:  Not on file  Occupational History  . Occupation: retired Lobbyist: RETIRED  Tobacco Use  . Smoking status: Never Smoker  . Smokeless tobacco: Never Used  . Tobacco comment: NEVER USED TOBACCO  Vaping Use  . Vaping Use: Never used  Substance and Sexual Activity  . Alcohol use: No    Alcohol/week: 0.0 standard drinks  . Drug use: No  .  Sexual activity: Yes    Partners: Female  Other Topics Concern  . Not on file  Social History Narrative   Marriedfor 18 years    Daughter, [name] (pediatrician--lives in [place], [place]), one in [place] - Engineer, water. One in [place] - Chief Strategy Officer          Social Determinants of Health   Financial Resource Strain: Low Risk   . Difficulty of Paying Living Expenses: Not hard at all  Food Insecurity: No Food Insecurity  . Worried About Charity fundraiser in the Last Year: Never true  . Ran Out of Food in the Last Year: Never true  Transportation Needs: No Transportation Needs  . Lack of Transportation (Medical): No  . Lack of Transportation (Non-Medical): No  Physical Activity: Sufficiently Active  . Days of Exercise per Week: 7 days  . Minutes of Exercise per Session: 60 min  Stress: No Stress Concern Present  . Feeling of Stress : Not at all  Social Connections: Moderately Integrated  . Frequency of Communication with Friends and Family: More than three times a week  . Frequency of Social Gatherings with Friends and Family: Twice a week  . Attends Religious Services: More than 4 times per year  . Active Member of Clubs or Organizations: No  . Attends Archivist Meetings: Never  . Marital Status: Married  Human resources officer Violence: Not At Risk  . Fear of Current or Ex-Partner: No  . Emotionally Abused: No  . Physically Abused: No  . Sexually Abused: No    Past Surgical History:  Procedure Laterality Date  . CARDIAC CATHETERIZATION  05/20/2013  . CATARACT EXTRACTION W/ INTRAOCULAR LENS  IMPLANT, BILATERAL Bilateral 2008  . CYSTECTOMY  ~ 2000   " SEBACEOUS cyst removed from between shoulder blades"  . EXCISIONAL HEMORRHOIDECTOMY  1970's  . INGUINAL HERNIA REPAIR Right ~ 2010  . LEFT HEART CATHETERIZATION WITH CORONARY ANGIOGRAM N/A 05/20/2013   Procedure: LEFT HEART CATHETERIZATION WITH CORONARY ANGIOGRAM;  Surgeon: Peter M Martinique, MD;  Location: [facility] CATH LAB;  Service:  Cardiovascular;  Laterality: N/A;  . TONSILLECTOMY AND ADENOIDECTOMY  1940's  . VASECTOMY      Family History  Problem Relation Age of Onset  . Colon cancer Mother        died at 67, colorectal  . Diabetes Mother   . Heart disease Father 34       MI  . Diabetes Father   . Stomach cancer Maternal Grandfather   . Cancer Maternal Uncle     Allergies  Allergen Reactions  . Metformin And Related Diarrhea    Current Outpatient Medications on File Prior to Visit  Medication Sig Dispense Refill  . ACCU-CHEK SOFTCLIX LANCETS lancets Use to test blood glucose once daily 100 each 3  . aspirin EC 81 MG tablet Take 1 tablet (81 mg total) by mouth daily. 90 tablet 3  . Blood Glucose Monitoring Suppl (ACCU-CHEK AVIVA PLUS) w/Device KIT Use to test blood glucose once daily 1 kit 0  . Coenzyme Q10 300 MG CAPS Take 1 capsule  by mouth daily.     [name] glucose blood (ACCU-CHEK AVIVA) test strip Use to test blood glucose once daily 100 each 3  . hydrocortisone (ANUSOL-HC) 25 MG suppository Place 25 mg rectally daily as needed for hemorrhoids or anal itching.    [name] PREVIDENT 5000 ENAMEL PROTECT 1.1-5 % PSTE See admin instructions.    [name] PROCTO-MED HC 2.5 % rectal cream APPLY 1 CREAM RECTALLY TWICE DAILY 90 g 3   No current facility-administered medications on file prior to visit.    BP 130/80   Pulse 63   Temp 98.1 F (36.7 C)   Ht '5\' 11"'  (1.803 m)   Wt 179 lb (81.2 kg)   SpO2 98%   BMI 24.97 kg/m       Objective:   Physical Exam Vitals and nursing note reviewed.  Constitutional:      General: He is not in acute distress.    Appearance: Normal appearance. He is well-developed and normal weight.  HENT:     Head: Normocephalic and atraumatic.     Right Ear: Tympanic membrane, ear canal and external ear normal. There is no impacted cerumen.     Left Ear: Tympanic membrane, ear canal and external ear normal. There is no impacted cerumen.     Nose: Nose normal. No congestion or rhinorrhea.      Mouth/Throat:     Mouth: Mucous membranes are moist.     Pharynx: Oropharynx is clear. No oropharyngeal exudate or posterior oropharyngeal erythema.  Eyes:     General:        Right eye: No discharge.        Left eye: No discharge.     Extraocular Movements: Extraocular movements intact.     Conjunctiva/sclera: Conjunctivae normal.     Pupils: Pupils are equal, round, and reactive to light.  Neck:     Vascular: No carotid bruit.     Trachea: No tracheal deviation.  Cardiovascular:     Rate and Rhythm: Normal rate and regular rhythm.     Pulses: Normal pulses.     Heart sounds: Normal heart sounds. No murmur heard. No friction rub. No gallop.   Pulmonary:     Effort: Pulmonary effort is normal. No respiratory distress.     Breath sounds: Normal breath sounds. No stridor. No wheezing, rhonchi or rales.  Chest:     Chest wall: No tenderness.  Abdominal:     General: Bowel sounds are normal. There is no distension.     Palpations: Abdomen is soft. There is no mass.     Tenderness: There is no abdominal tenderness. There is no right CVA tenderness, left CVA tenderness, guarding or rebound.     Hernia: No hernia is present.  Musculoskeletal:        General: No swelling, tenderness, deformity or signs of injury. Normal range of motion.     Right lower leg: No edema.     Left lower leg: No edema.  Lymphadenopathy:     Cervical: No cervical adenopathy.  Skin:    General: Skin is warm and dry.     Capillary Refill: Capillary refill takes less than 2 seconds.     Coloration: Skin is not jaundiced or pale.     Findings: No bruising, erythema, lesion or rash.  Neurological:     General: No focal deficit present.     Mental Status: He is alert and oriented to person, place, and time.     Cranial Nerves: No  cranial nerve deficit.     Sensory: No sensory deficit.     Motor: No weakness.     Coordination: Coordination normal.     Gait: Gait normal.     Deep Tendon Reflexes:  Reflexes normal.  Psychiatric:        Mood and Affect: Mood normal.        Behavior: Behavior normal.        Thought Content: Thought content normal.        Judgment: Judgment normal.       Assessment & Plan:  1. Routine general medical examination at a health care facility - Remarkable 80 year old male  - Follow up in one year or sooner if needed - CBC with Differential/Platelet; Future - Comprehensive metabolic panel; Future - Hemoglobin A1c; Future - Lipid panel; Future - TSH; Future - TSH - Hemoglobin A1c - Lipid panel - Comprehensive metabolic panel - CBC with Differential/Platelet  2. Essential hypertension - No changes in medication. Well controlled.  - CBC with Differential/Platelet; Future - Comprehensive metabolic panel; Future - Hemoglobin A1c; Future - Lipid panel; Future - TSH; Future - lisinopril (ZESTRIL) 10 MG tablet; Take 1 tablet (10 mg total) by mouth daily.  Dispense: 90 tablet; Refill: 3 - amLODipine (NORVASC) 10 MG tablet; Take 1 tablet (10 mg total) by mouth daily.  Dispense: 90 tablet; Refill: 3 - TSH - Hemoglobin A1c - Lipid panel - Comprehensive metabolic panel - CBC with Differential/Platelet  3. Gastroesophageal reflux disease without esophagitis - Continue with Protonix  - CBC with Differential/Platelet; Future - Comprehensive metabolic panel; Future - Hemoglobin A1c; Future - Lipid panel; Future - TSH; Future - pantoprazole (PROTONIX) 40 MG tablet; Take 1 tablet (40 mg total) by mouth daily.  Dispense: 365 tablet; Refill: 0 - TSH - Hemoglobin A1c - Lipid panel - Comprehensive metabolic panel - CBC with Differential/Platelet  4. Polycythemia vera ([place]) - Follow up with Dr. Marin Olp as directed - CBC with Differential/Platelet; Future - Comprehensive metabolic panel; Future - Hemoglobin A1c; Future - Lipid panel; Future - TSH; Future - TSH - Hemoglobin A1c - Lipid panel - Comprehensive metabolic panel - CBC with  Differential/Platelet  5. Hyperlipidemia, unspecified hyperlipidemia type - Consider increase in statin  - CBC with Differential/Platelet; Future - Comprehensive metabolic panel; Future - Hemoglobin A1c; Future - Lipid panel; Future - TSH; Future - pravastatin (PRAVACHOL) 40 MG tablet; Take 1 tablet (40 mg total) by mouth daily.  Dispense: 365 tablet; Refill: 0 - TSH - Hemoglobin A1c - Lipid panel - Comprehensive metabolic panel - CBC with Differential/Platelet  6. Idiopathic gout, unspecified chronicity, unspecified site  - allopurinol (ZYLOPRIM) 100 MG tablet; Take 1 tablet (100 mg total) by mouth daily.  Dispense: 365 tablet; Refill: 0  7. Benign prostatic hyperplasia without lower urinary tract symptoms  - PSA; Future - PSA  8. Glucose intolerance  - CBC with Differential/Platelet; Future - Comprehensive metabolic panel; Future - Hemoglobin A1c; Future - Lipid panel; Future - TSH; Future - TSH - Hemoglobin A1c - Lipid panel - Comprehensive metabolic panel - CBC with Differential/Platelet   [name], NP

## 2020-04-09 ENCOUNTER — Encounter: Payer: Self-pay | Admitting: Adult Health

## 2020-04-23 ENCOUNTER — Other Ambulatory Visit: Payer: Self-pay | Admitting: Adult Health

## 2020-04-23 DIAGNOSIS — G8929 Other chronic pain: Secondary | ICD-10-CM

## 2020-04-27 ENCOUNTER — Encounter: Payer: Self-pay | Admitting: Adult Health

## 2020-04-27 ENCOUNTER — Other Ambulatory Visit: Payer: Self-pay | Admitting: Adult Health

## 2020-04-27 MED ORDER — METHYLPREDNISOLONE 4 MG PO TBPK: ORAL_TABLET | ORAL | 0 refills | Status: DC

## 2020-05-04 ENCOUNTER — Inpatient Hospital Stay: Payer: Medicare HMO

## 2020-05-04 ENCOUNTER — Inpatient Hospital Stay: Payer: Medicare HMO | Admitting: Hematology & Oncology

## 2020-05-05 ENCOUNTER — Ambulatory Visit (INDEPENDENT_AMBULATORY_CARE_PROVIDER_SITE_OTHER): Payer: Medicare HMO | Admitting: Physical Therapy

## 2020-05-05 ENCOUNTER — Encounter: Payer: Self-pay | Admitting: Physical Therapy

## 2020-05-05 ENCOUNTER — Other Ambulatory Visit: Payer: Self-pay

## 2020-05-05 DIAGNOSIS — G8929 Other chronic pain: Secondary | ICD-10-CM | POA: Diagnosis not present

## 2020-05-05 DIAGNOSIS — M545 Low back pain, unspecified: Secondary | ICD-10-CM

## 2020-05-05 DIAGNOSIS — M62830 Muscle spasm of back: Secondary | ICD-10-CM | POA: Diagnosis not present

## 2020-05-05 NOTE — Therapy (Signed)
[facility] [name] [place], [place], [address] Phone: [phone]   Fax:  [phone]  Physical Therapy Evaluation  Patient Details  Name: Alexander [name] MRN: [id number] Date of Birth: 02/25/1940 Referring Provider (PT): [name], NP   Encounter Date: 05/05/2020   PT End of Session - 05/05/20 1516    Visit Number 1    Number of Visits 12    Date for PT Re-Evaluation 06/16/20    Authorization Type [employer] Medicare    PT Start Time 1302    PT Stop Time 1350    PT Time Calculation (min) 48 min    Activity Tolerance Patient tolerated treatment well    Behavior During Therapy [facility] for tasks assessed/performed           Past Medical History:  Diagnosis Date  . Arthritis    "minor; shoulders primarily" (05/20/2013)  . CAD (coronary artery disease)    a. 05/2013 - Chest pain/unstable angina and dynamic EKG changes showing cath showing atherosclerotic coronary artery disease manifested as diffuse ectasia, normal EF.  . Diverticulosis of colon   . GERD (gastroesophageal reflux disease)   . GI bleed    secondary to Aspirin  . Gout   . Hematuria, microscopic   . Hemorrhoids   . Hyperglycemia   . Hyperlipidemia   . Hypertension   . Lumbar back pain   . Microalbuminuria   . Overweight(278.02)   . Polycythemia rubra vera ([place])   . Tubular adenoma of colon 07/2012    Past Surgical History:  Procedure Laterality Date  . CARDIAC CATHETERIZATION  05/20/2013  . CATARACT EXTRACTION W/ INTRAOCULAR LENS  IMPLANT, BILATERAL Bilateral 2008  . CYSTECTOMY  ~ 2000   " SEBACEOUS cyst removed from between shoulder blades"  . EXCISIONAL HEMORRHOIDECTOMY  1970's  . INGUINAL HERNIA REPAIR Right ~ 2010  . LEFT HEART CATHETERIZATION WITH CORONARY ANGIOGRAM N/A 05/20/2013   Procedure: LEFT HEART CATHETERIZATION WITH CORONARY ANGIOGRAM;  Surgeon: Peter M Martinique, MD;  Location: [facility] CATH LAB;  Service: Cardiovascular;  Laterality: N/A;  .  TONSILLECTOMY AND ADENOIDECTOMY  1940's  . VASECTOMY      There were no vitals filed for this visit.    Subjective Assessment - 05/05/20 1304    Subjective Pt reports pain in bil  low back that has intermittently bothered him for years but has gotten progressively worse in the past couple weeks. States he gets "stuck" sometimes when being forward. Pain is reproduced during forward flexion and sitting in a chair without back support for long periods of time. Pt experienced some pain relief with prednisone but it did not completely resolve his symptoms. Pt takes tylenol daily in AM to relieve pain. Pt is highly active and reports pain does not limit him from performing household activities. Pt and his wife report they go to the gym and swim for 1-2 hours several days a week. Pt reports pain in his low back while swimming in backstroke position.    Pertinent History inguinal hernia repair in 2010    How long can you stand comfortably? Pt reports no difficulty with standing for long periods of time    Diagnostic tests X-ray of spine from T9-S3 in 2010 revealed DDD with disc space narrowing, osteophytes, and lumbar facet degeneration.    Patient Stated Goals reduce pain in low back    Currently in Pain? Yes    Pain Score 9    Pt reports 9/10 pain with forward  flexion   Pain Location Back    Pain Orientation Right;Left;Lower    Pain Descriptors / Indicators Stabbing;Aching    Pain Type Chronic pain    Pain Radiating Towards Pain up up 9/10 only at times. Other times pain 0/10    Pain Onset More than a month ago    Pain Frequency Intermittent    Aggravating Factors  forward flexion, sitting with back unsupported, swimming    Pain Relieving Factors tylenol, rest    Effect of Pain on Daily Activities Pt reports pain does not limit daily activties              [facility] PT Assessment - 05/06/20 0001      Assessment   Medical Diagnosis chronic midline low back pain without sciatica    Referring  Provider (PT) [name], NP    Prior Therapy PT previously for shoulder pain and tension in upper trapezius      Precautions   Precautions None      Restrictions   Weight Bearing Restrictions No      Balance Screen   Has the patient fallen in the past 6 months No      Prior Function   Level of Independence Independent      Cognition   Overall Cognitive Status Within Functional Limits for tasks assessed      Observation/Other Assessments   Other Surveys  Oswestry Disability Index    Oswestry Disability Index  3/50 (6%)      AROM   Lumbar Flexion 2 cm   measured from between PSIS to 15 cm superior   Lumbar Extension 2 cm   measured from between PSIS to 15 cm superior   Lumbar - Right Side Bend 49 cm above ground; pain    Lumbar - Left Side Bend 57 cm above ground    Lumbar - Right Rotation 8 cm   measured from L AC joint to R greater trochanter   Lumbar - Left Rotation 15 cm   measured from R AC joint to L greater trochanter     Strength   Right Hip Flexion 4+/5      Palpation   Palpation comment Stiffness with CPA glides at L3-5, tenderness at bil lumbar musculature L3-5; moderate hypomobility with bil hip flexion.                      Objective measurements completed on examination: See above findings.               PT Education - 05/05/20 1514    Education Details inital HEP, PT POC, exam findings    Person(s) Educated Patient;Spouse    Methods Explanation;Demonstration;Tactile cues;Verbal cues;Handout    Comprehension Verbalized understanding;Returned demonstration;Verbal cues required;Tactile cues required;Need further instruction            PT Short Term Goals - 05/05/20 1525      PT SHORT TERM GOAL #1   Title Pt will be able to perform initial HEP independently    Time 2    Period Weeks    Status New    Target Date 05/19/20             PT Long Term Goals - 05/05/20 1526      PT LONG TERM GOAL #1   Title Pt will be  able to perform final HEP indpendently    Time 6    Period Weeks    Status New  Target Date 06/16/20      PT LONG TERM GOAL #2   Title Pt will be able to perform a squat and reach an object from the ground with optimal body mechanics and no pain    Time 6    Period Weeks    Status New    Target Date 06/16/20      PT LONG TERM GOAL #3   Title Pt will report 0-1/10 pain with forward bending and performing household chores    Time 6    Period Weeks    Status New    Target Date 06/16/20      PT LONG TERM GOAL #4   Title Pt will be able to swim for 1 hour without pain in his low back    Time 6    Period Weeks    Status New    Target Date 06/16/20                  Plan - 05/05/20 1517    Clinical Impression Statement Pt is an 80 y/o male with bilateral chronic low back pain. Pts low back pain is increased with forward flexion activities and physical activity and causes difficulty with performing household activities. Pt demonstrates poor postural control with forward bending and would benefit from postural education. PT is necessary to relieve muscle tension and stiffness in pts low back and educate pt on correct posture with bending activities. Prognosis is good due to pts activity level and support from spouse. Plan to practice squatting and lifting exercises with optimal posture.    Personal Factors and Comorbidities Time since onset of injury/illness/exacerbation    Examination-Activity Limitations Bend;Sit;Squat    Examination-Participation Restrictions Cleaning;Laundry;Yard Work;Meal Prep    Stability/Clinical Decision Making Stable/Uncomplicated    Clinical Decision Making Low    Rehab Potential Good    PT Frequency 2x / week    PT Duration 6 weeks    PT Treatment/Interventions ADLs/Self Care Home Management;Aquatic Therapy;Cryotherapy;Electrical Stimulation;Iontophoresis 4mg /ml Dexamethasone;Moist Heat;Traction;Ultrasound;Gait training;Stair training;Functional  mobility training;Therapeutic activities;Therapeutic exercise;Balance training;Neuromuscular re-education;Patient/family education;Manual techniques;Passive range of motion;Dry needling;Taping;Spinal Manipulations;Joint Manipulations    PT Next Visit Plan Cat cow exercise, squats, lifting, hip hinge with dowel, manual traction on bil hips    Consulted and Agree with Plan of Care Patient           Patient will benefit from skilled therapeutic intervention in order to improve the following deficits and impairments:  Increased muscle spasms,Pain,Improper body mechanics,Postural dysfunction,Decreased range of motion,Impaired flexibility,Decreased strength,Decreased activity tolerance  Visit Diagnosis: Chronic bilateral low back pain without sciatica  Muscle spasm of back     Problem List Patient Active Problem List   Diagnosis Date Noted  . TMJ syndrome 03/05/2017  . Hemorrhoids 01/21/2014  . CAD (coronary artery disease) 05/22/2013  . Angina at rest [facility]) 05/20/2013  . Unstable angina ([place]) 05/20/2013  . Allergic rhinitis 04/29/2010  . GERD 10/05/2008  . MICROALBUMINURIA 07/22/2008  . BACK PAIN, LUMBAR 06/26/2008  . MICROSCOPIC HEMATURIA 03/20/2007  . Prediabetes 12/13/2006  . Polycythemia vera ([place]) 11/26/2006  . Hyperlipidemia 11/26/2006  . Gout 11/26/2006  . Essential hypertension 11/26/2006  . DIVERTICULOSIS, COLON 11/26/2006  . INGUINAL PAIN, RIGHT 11/26/2006  . COLONIC POLYPS, HX OF 11/26/2006  . NEPHROLITHIASIS, HX OF 11/26/2006    Leighton Parody SPT 05/05/2020   [name], PT, DPT 10:49 AM  05/06/20   This entire session was performed under direct supervision and direction of a licensed therapist/therapist assistant .  I have personally read, edited and approve of the note as written.   [facility] [address] [place], [place], [address] Phone: [phone]   Fax:  [phone]  Name: Alexander [name] MRN:  [id number] Date of Birth: [birth year]

## 2020-05-10 ENCOUNTER — Inpatient Hospital Stay (HOSPITAL_BASED_OUTPATIENT_CLINIC_OR_DEPARTMENT_OTHER): Payer: Medicare HMO | Admitting: Hematology & Oncology

## 2020-05-10 ENCOUNTER — Telehealth: Payer: Self-pay

## 2020-05-10 ENCOUNTER — Inpatient Hospital Stay: Payer: Medicare HMO | Attending: Hematology & Oncology

## 2020-05-10 ENCOUNTER — Inpatient Hospital Stay: Payer: Medicare HMO

## 2020-05-10 ENCOUNTER — Other Ambulatory Visit: Payer: Self-pay

## 2020-05-10 ENCOUNTER — Encounter: Payer: Self-pay | Admitting: Hematology & Oncology

## 2020-05-10 VITALS — BP 134/71 | HR 57 | Temp 98.4°F | Resp 18 | Wt 176.0 lb

## 2020-05-10 DIAGNOSIS — D751 Secondary polycythemia: Secondary | ICD-10-CM | POA: Insufficient documentation

## 2020-05-10 DIAGNOSIS — D45 Polycythemia vera: Secondary | ICD-10-CM | POA: Diagnosis not present

## 2020-05-10 LAB — CBC WITH DIFFERENTIAL (CANCER CENTER ONLY)
Abs Immature Granulocytes: 0.04 10*3/uL (ref 0.00–0.07)
Basophils Absolute: 0.1 10*3/uL (ref 0.0–0.1)
Basophils Relative: 1 %
Eosinophils Absolute: 0.1 10*3/uL (ref 0.0–0.5)
Eosinophils Relative: 1 %
HCT: 45.2 % (ref 39.0–52.0)
Hemoglobin: 15.2 g/dL (ref 13.0–17.0)
Immature Granulocytes: 0 %
Lymphocytes Relative: 19 %
Lymphs Abs: 2.2 10*3/uL (ref 0.7–4.0)
MCH: 31.3 pg (ref 26.0–34.0)
MCHC: 33.6 g/dL (ref 30.0–36.0)
MCV: 93.2 fL (ref 80.0–100.0)
Monocytes Absolute: 1.4 10*3/uL — ABNORMAL HIGH (ref 0.1–1.0)
Monocytes Relative: 12 %
Neutro Abs: 7.6 10*3/uL (ref 1.7–7.7)
Neutrophils Relative %: 67 %
Platelet Count: 237 10*3/uL (ref 150–400)
RBC: 4.85 MIL/uL (ref 4.22–5.81)
RDW: 14.6 % (ref 11.5–15.5)
WBC Count: 11.5 10*3/uL — ABNORMAL HIGH (ref 4.0–10.5)
nRBC: 0 % (ref 0.0–0.2)

## 2020-05-10 LAB — CMP (CANCER CENTER ONLY)
ALT: 17 U/L (ref 0–44)
AST: 15 U/L (ref 15–41)
Albumin: 4.1 g/dL (ref 3.5–5.0)
Alkaline Phosphatase: 71 U/L (ref 38–126)
Anion gap: 5 (ref 5–15)
BUN: 20 mg/dL (ref 8–23)
CO2: 29 mmol/L (ref 22–32)
Calcium: 10.1 mg/dL (ref 8.9–10.3)
Chloride: 103 mmol/L (ref 98–111)
Creatinine: 1.31 mg/dL — ABNORMAL HIGH (ref 0.61–1.24)
GFR, Estimated: 55 mL/min — ABNORMAL LOW (ref >60)
Glucose, Bld: 115 mg/dL — ABNORMAL HIGH (ref 70–99)
Potassium: 4.8 mmol/L (ref 3.5–5.1)
Sodium: 137 mmol/L (ref 135–145)
Total Bilirubin: 0.5 mg/dL (ref 0.3–1.2)
Total Protein: 6.9 g/dL (ref 6.5–8.1)

## 2020-05-10 LAB — LACTATE DEHYDROGENASE: LDH: 129 U/L (ref 98–192)

## 2020-05-10 NOTE — Telephone Encounter (Signed)
appts made per 5.9.22 los and pt to gain sch in tx/avs and through [employer]

## 2020-05-10 NOTE — Progress Notes (Signed)
Hematology and Oncology Follow Up Visit  Alexander [name] [id number] [birth year] 80 y.o. 05/10/2020   Principle Diagnosis:  Secondary Polycythemia/Erythrocytosis-JAK2 negative  Current Therapy:    Phlebotomy to maintain hematocrit below 45% - 12/29/2015  Aspirin 81 mg by mouth daily     Interim History:  Mr.  Alexander [name] is in for a followup.  As always, he is doing quite well.  He feels good.  He is exercising.  He cannot wait for his pool to open up.  It is just too cold right now for this to happen.  He has had no cardiac issues.  He has had no cough or shortness of breath.  There is no problems with nausea or vomiting.  There is no change in bowel or bladder habits.  He is having relatives come over from [place].  They visited for a couple weeks.  He and his wife both enjoyed this.  His wife is having her problems with her poor back.  He has had no problems with leg swelling.  Overall, his performance status is ECOG 1.     Medications:  Current Outpatient Medications:  .  methylPREDNISolone (MEDROL DOSEPAK) 4 MG TBPK tablet, Take as directed, Disp: 21 tablet, Rfl: 0 .  ACCU-CHEK SOFTCLIX LANCETS lancets, Use to test blood glucose once daily, Disp: 100 each, Rfl: 3 .  allopurinol (ZYLOPRIM) 100 MG tablet, Take 1 tablet (100 mg total) by mouth daily., Disp: 365 tablet, Rfl: 0 .  amLODipine (NORVASC) 10 MG tablet, Take 1 tablet (10 mg total) by mouth daily., Disp: 90 tablet, Rfl: 3 .  aspirin EC 81 MG tablet, Take 1 tablet (81 mg total) by mouth daily., Disp: 90 tablet, Rfl: 3 .  Blood Glucose Monitoring Suppl (ACCU-CHEK AVIVA PLUS) w/Device KIT, Use to test blood glucose once daily, Disp: 1 kit, Rfl: 0 .  Coenzyme Q10 300 MG CAPS, Take 1 capsule by mouth daily. , Disp: , Rfl:  .  glucose blood (ACCU-CHEK AVIVA) test strip, Use to test blood glucose once daily, Disp: 100 each, Rfl: 3 .  hydrocortisone (ANUSOL-HC) 25 MG suppository, Place 25 mg rectally daily as needed for  hemorrhoids or anal itching., Disp: , Rfl:  .  lisinopril (ZESTRIL) 10 MG tablet, Take 1 tablet (10 mg total) by mouth daily., Disp: 90 tablet, Rfl: 3 .  pantoprazole (PROTONIX) 40 MG tablet, Take 1 tablet (40 mg total) by mouth daily., Disp: 365 tablet, Rfl: 0 .  pravastatin (PRAVACHOL) 40 MG tablet, Take 1 tablet (40 mg total) by mouth daily., Disp: 365 tablet, Rfl: 0 .  PREVIDENT 5000 ENAMEL PROTECT 1.1-5 % PSTE, See admin instructions., Disp: , Rfl:  .  PROCTO-MED HC 2.5 % rectal cream, APPLY 1 CREAM RECTALLY TWICE DAILY, Disp: 90 g, Rfl: 3  Allergies:  Allergies  Allergen Reactions  . Metformin And Related Diarrhea    Past Medical History, Surgical history, Social history, and Family History were reviewed and updated.  Review of Systems: Review of Systems  Constitutional: Negative.   HENT: Negative.   Eyes: Negative.   Respiratory: Negative.   Cardiovascular: Negative.   Gastrointestinal: Negative.   Genitourinary: Negative.   Musculoskeletal: Negative.   Skin: Negative.   Neurological: Negative.   Endo/Heme/Allergies: Negative.   Psychiatric/Behavioral: Negative.     Physical Exam:  weight is 176 lb (79.8 kg). His oral temperature is 98.4 F (36.9 C). His blood pressure is 134/71 and his pulse is 57 (abnormal). His respiration is 18 and oxygen saturation is  100%.   Physical Exam Vitals reviewed.  HENT:     Head: Normocephalic and atraumatic.  Eyes:     Pupils: Pupils are equal, round, and reactive to light.  Cardiovascular:     Rate and Rhythm: Normal rate and regular rhythm.     Heart sounds: Normal heart sounds.  Pulmonary:     Effort: Pulmonary effort is normal.     Breath sounds: Normal breath sounds.  Abdominal:     General: Bowel sounds are normal.     Palpations: Abdomen is soft.  Musculoskeletal:        General: No tenderness or deformity. Normal range of motion.     Cervical back: Normal range of motion.  Lymphadenopathy:     Cervical: No cervical  adenopathy.  Skin:    General: Skin is warm and dry.     Findings: No erythema or rash.  Neurological:     Mental Status: He is alert and oriented to person, place, and time.  Psychiatric:        Behavior: Behavior normal.        Thought Content: Thought content normal.        Judgment: Judgment normal.      Lab Results  Component Value Date   WBC 11.5 (H) 05/10/2020   HGB 15.2 05/10/2020   HCT 45.2 05/10/2020   MCV 93.2 05/10/2020   PLT 237 05/10/2020     Chemistry      Component Value Date/Time   NA 139 03/12/2020 1110   NA 144 11/29/2016 1054   NA 139 12/29/2015 0954   K 4.7 03/12/2020 1110   K 4.2 11/29/2016 1054   K 4.1 12/29/2015 0954   CL 104 03/12/2020 1110   CL 102 11/29/2016 1054   CO2 28 03/12/2020 1110   CO2 30 11/29/2016 1054   CO2 26 12/29/2015 0954   BUN 18 03/12/2020 1110   BUN 22 11/29/2016 1054   BUN 20.0 12/29/2015 0954   CREATININE 1.21 03/12/2020 1110   CREATININE 1.33 (H) 02/05/2020 1136   CREATININE 1.2 11/29/2016 1054   CREATININE 1.3 12/29/2015 0954      Component Value Date/Time   CALCIUM 9.6 03/12/2020 1110   CALCIUM 9.3 11/29/2016 1054   CALCIUM 9.5 12/29/2015 0954   ALKPHOS 77 03/12/2020 1110   ALKPHOS 92 (H) 11/29/2016 1054   ALKPHOS 98 12/29/2015 0954   AST 14 03/12/2020 1110   AST 13 (L) 02/05/2020 1136   AST 16 12/29/2015 0954   ALT 12 03/12/2020 1110   ALT 12 02/05/2020 1136   ALT 20 11/29/2016 1054   ALT 13 12/29/2015 0954   BILITOT 1.2 03/12/2020 1110   BILITOT 0.7 02/05/2020 1136   BILITOT 0.71 12/29/2015 0954      Impression and Plan: Alexander [name] is 80 year old gentleman. He has secondary polycythemia/erythrocytosis. He is JAK2 negative.   We will go ahead and phlebotomize him.  This will get him through the summertime if we do a phlebotomy.  I am just happy that he is doing well.  I will plan to get him back after Labor Day at this point.  I am sure that he will be busy this summer.     [name], MD 5/9/20223:08 PM

## 2020-05-11 ENCOUNTER — Encounter: Payer: Self-pay | Admitting: Hematology & Oncology

## 2020-05-12 ENCOUNTER — Encounter: Payer: Self-pay | Admitting: Physical Therapy

## 2020-05-12 ENCOUNTER — Other Ambulatory Visit: Payer: Self-pay

## 2020-05-12 ENCOUNTER — Ambulatory Visit (INDEPENDENT_AMBULATORY_CARE_PROVIDER_SITE_OTHER): Payer: Medicare HMO | Admitting: Physical Therapy

## 2020-05-12 DIAGNOSIS — M6281 Muscle weakness (generalized): Secondary | ICD-10-CM | POA: Diagnosis not present

## 2020-05-12 DIAGNOSIS — M545 Low back pain, unspecified: Secondary | ICD-10-CM

## 2020-05-12 DIAGNOSIS — R262 Difficulty in walking, not elsewhere classified: Secondary | ICD-10-CM

## 2020-05-12 NOTE — Therapy (Signed)
[facility] [address] [place], [place], [address] Phone: [phone]   Fax:  [phone]  Physical Therapy Treatment  Patient Details  Name: Alexander [name] MRN: [id number] Date of Birth: 1940-11-28 Referring Provider (PT): [name], NP   Encounter Date: 05/12/2020   PT End of Session - 05/12/20 1717    Visit Number 2    Number of Visits 12    Date for PT Re-Evaluation 06/16/20    Authorization Type [employer] Medicare    PT Start Time 1430    PT Stop Time 1515    PT Time Calculation (min) 45 min    Activity Tolerance Patient tolerated treatment well    Behavior During Therapy [facility] for tasks assessed/performed           Past Medical History:  Diagnosis Date  . Arthritis    "minor; shoulders primarily" (05/20/2013)  . CAD (coronary artery disease)    a. 05/2013 - Chest pain/unstable angina and dynamic EKG changes showing cath showing atherosclerotic coronary artery disease manifested as diffuse ectasia, normal EF.  . Diverticulosis of colon   . GERD (gastroesophageal reflux disease)   . GI bleed    secondary to Aspirin  . Gout   . Hematuria, microscopic   . Hemorrhoids   . Hyperglycemia   . Hyperlipidemia   . Hypertension   . Lumbar back pain   . Microalbuminuria   . Overweight(278.02)   . Polycythemia rubra vera ([place])   . Tubular adenoma of colon 07/2012    Past Surgical History:  Procedure Laterality Date  . CARDIAC CATHETERIZATION  05/20/2013  . CATARACT EXTRACTION W/ INTRAOCULAR LENS  IMPLANT, BILATERAL Bilateral 2008  . CYSTECTOMY  ~ 2000   " SEBACEOUS cyst removed from between shoulder blades"  . EXCISIONAL HEMORRHOIDECTOMY  1970's  . INGUINAL HERNIA REPAIR Right ~ 2010  . LEFT HEART CATHETERIZATION WITH CORONARY ANGIOGRAM N/A 05/20/2013   Procedure: LEFT HEART CATHETERIZATION WITH CORONARY ANGIOGRAM;  Surgeon: Peter M Martinique, MD;  Location[facility] CATH LAB;  Service: Cardiovascular;  Laterality: N/A;  .  TONSILLECTOMY AND ADENOIDECTOMY  1940's  . VASECTOMY      There were no vitals filed for this visit.   Subjective Assessment - 05/12/20 1434    Subjective Pt states that the pain is low and is only with forward flexion positions. Pt reports no issues with HEP. Pt states he is done with the cortisone pills.    Pertinent History inguinal hernia repair in 2010    How long can you stand comfortably? Pt reports no difficulty with standing for long periods of time    Diagnostic tests X-ray of spine from T9-S3 in 2010 revealed DDD with disc space narrowing, osteophytes, and lumbar facet degeneration.    Patient Stated Goals reduce pain in low back    Currently in Pain? Yes    Pain Score 1     Pain Orientation Right;Left;Lower    Pain Descriptors / Indicators Aching    Pain Onset More than a month ago                             [facility] Adult PT Treatment/Exercise - 05/12/20 0001      Exercises   Exercises Lumbar      Lumbar Exercises: Stretches   Active Hamstring Stretch 3 reps;30 seconds    Active Hamstring Stretch Limitations bil; seated    Lower Trunk Rotation Limitations Supine  SKTC 10x 3s holds  5x 10x each     Lumbar Exercises: Standing   Other Standing Lumbar Exercises standing hip hinge with dowel 20x      Lumbar Exercises: Supine   Pelvic Tilt 10 reps    Pelvic Tilt Limitations cued for pelvic rotation    Bridge 10 reps    Other Supine Lumbar Exercises LTR 10x each      Lumbar Exercises: Quadruped   Madcat/Old Horse 10 reps      Manual Therapy   Manual Therapy Joint mobilization    Joint Mobilization CPA and UPA glides for lumbar spine; grade II-III; L/S traction mulligan belt grade III 5 min                    PT Short Term Goals - 05/05/20 1525      PT SHORT TERM GOAL #1   Title Pt will be able to perform initial HEP independently    Time 2    Period Weeks    Status New    Target Date 05/19/20             PT Long Term Goals  - 05/05/20 1526      PT LONG TERM GOAL #1   Title Pt will be able to perform final HEP indpendently    Time 6    Period Weeks    Status New    Target Date 06/16/20      PT LONG TERM GOAL #2   Title Pt will be able to perform a squat and reach an object from the ground with optimal body mechanics and no pain    Time 6    Period Weeks    Status New    Target Date 06/16/20      PT LONG TERM GOAL #3   Title Pt will report 0-1/10 pain with forward bending and performing household chores    Time 6    Period Weeks    Status New    Target Date 06/16/20      PT LONG TERM GOAL #4   Title Pt will be able to swim for 1 hour without pain in his low back    Time 6    Period Weeks    Status New    Target Date 06/16/20                 Plan - 05/12/20 1447    Clinical Impression Statement Pt presents with L/S joint stiffness from L1-5 that was improved with progressed mobility exercise and manual therapy. Pt was able to tolerate increased pelvic excursion movements but continues to demonstrates increased soft tissue restrictions that limit full ROM. Pt appears to be more stiff dominant than pain dominant at this time. Required heavy VC and TC for hip hinging position. Plan to progress mobility and lumbopelvic stability training as tolerated. Pt would benefit from continued skilled therapy in order to reach goals and maximize functional lumbopelvic strength and ROM for full return to PLOF.    Personal Factors and Comorbidities Time since onset of injury/illness/exacerbation    Examination-Activity Limitations Bend;Sit;Squat    Examination-Participation Restrictions Cleaning;Laundry;Yard Work;Meal Prep    Stability/Clinical Decision Making Stable/Uncomplicated    Rehab Potential Good    PT Frequency 2x / week    PT Duration 6 weeks    PT Treatment/Interventions ADLs/Self Care Home Management;Aquatic Therapy;Cryotherapy;Electrical Stimulation;Iontophoresis 4mg /ml Dexamethasone;Moist  Heat;Traction;Ultrasound;Gait training;Stair training;Functional mobility training;Therapeutic activities;Therapeutic exercise;Balance training;Neuromuscular re-education;Patient/family education;Manual techniques;Passive range  of motion;Dry needling;Taping;Spinal Manipulations;Joint Manipulations    PT Next Visit Plan Review cat cow exercise, squats, lifting, hip hinge with dowel, manual traction on bil hips    Consulted and Agree with Plan of Care Patient           Patient will benefit from skilled therapeutic intervention in order to improve the following deficits and impairments:  Increased muscle spasms,Pain,Improper body mechanics,Postural dysfunction,Decreased range of motion,Impaired flexibility,Decreased strength,Decreased activity tolerance  Visit Diagnosis: Pain, lumbar region  Muscle weakness (generalized)  Difficulty walking     Problem List Patient Active Problem List   Diagnosis Date Noted  . TMJ syndrome 03/05/2017  . Hemorrhoids 01/21/2014  . CAD (coronary artery disease) 05/22/2013  . Angina at rest [facility]) 05/20/2013  . Unstable angina ([place]) 05/20/2013  . Allergic rhinitis 04/29/2010  . GERD 10/05/2008  . MICROALBUMINURIA 07/22/2008  . BACK PAIN, LUMBAR 06/26/2008  . MICROSCOPIC HEMATURIA 03/20/2007  . Prediabetes 12/13/2006  . Polycythemia vera ([place]) 11/26/2006  . Hyperlipidemia 11/26/2006  . Gout 11/26/2006  . Essential hypertension 11/26/2006  . DIVERTICULOSIS, COLON 11/26/2006  . INGUINAL PAIN, RIGHT 11/26/2006  . COLONIC POLYPS, HX OF 11/26/2006  . NEPHROLITHIASIS, HX OF 11/26/2006    Daleen Bo PT, DPT 05/12/20 5:17 PM   [place], [place], [address] Phone: [phone]   Fax:  [phone]  Name: DEONDRAE MCGRAIL MRN: [id number] Date of Birth: 08/17/1940

## 2020-05-19 ENCOUNTER — Encounter: Payer: Self-pay | Admitting: Physical Therapy

## 2020-05-19 ENCOUNTER — Ambulatory Visit (INDEPENDENT_AMBULATORY_CARE_PROVIDER_SITE_OTHER): Payer: Medicare HMO | Admitting: Physical Therapy

## 2020-05-19 ENCOUNTER — Other Ambulatory Visit: Payer: Self-pay

## 2020-05-19 DIAGNOSIS — M545 Low back pain, unspecified: Secondary | ICD-10-CM | POA: Diagnosis not present

## 2020-05-19 DIAGNOSIS — M6281 Muscle weakness (generalized): Secondary | ICD-10-CM

## 2020-05-19 NOTE — Therapy (Addendum)
[facility] [address] [place], [place], [address] Phone: [phone]   Fax:  [phone]  Physical Therapy Treatment  Patient Details  Name: Alexander [name] MRN: [id number] Date of Birth: [birth year] Referring Provider (PT): [name], NP   Encounter Date: 05/19/2020   PT End of Session - 05/19/20 1508     Visit Number 3    Number of Visits 12    Date for PT Re-Evaluation 06/16/20    Authorization Type A[employer]na Medicare    PT Start Time 1347    PT Stop Time 1429    PT Time Calculation (min) 42 min    Activity Tolerance Patient tolerated treatment well    Behavior During Therapy [facility] for tasks assessed/performed             Past Medical History:  Diagnosis Date   Arthritis    "minor; shoulders primarily" (05/20/2013)   CAD (coronary artery disease)    a. 05/2013 - Chest pain/unstable angina and dynamic EKG changes showing cath showing atherosclerotic coronary artery disease manifested as diffuse ectasia, normal EF.   Diverticulosis of colon    GERD (gastroesophageal reflux disease)    GI bleed    secondary to Aspirin   Gout    Hematuria, microscopic    Hemorrhoids    Hyperglycemia    Hyperlipidemia    Hypertension    Lumbar back pain    Microalbuminuria    Overweight(278.02)    Polycythemia rubra vera ([place])    Tubular adenoma of colon 07/2012    Past Surgical History:  Procedure Laterality Date   CARDIAC CATHETERIZATION  05/20/2013   CATARACT EXTRACTION W/ INTRAOCULAR LENS  IMPLANT, BILATERAL Bilateral 2008   CYSTECTOMY  ~ 2000   " SEBACEOUS cyst removed from between shoulder blades"   EXCISIONAL HEMORRHOIDECTOMY  1970's   INGUINAL HERNIA REPAIR Right ~ 2010   LEFT HEART CATHETERIZATION WITH CORONARY ANGIOGRAM N/A 05/20/2013   Procedure: LEFT HEART CATHETERIZATION WITH CORONARY ANGIOGRAM;  Surgeon: Peter M Martinique, MD;  Location: [facility] CATH LAB;  Service: Cardiovascular;  Laterality: N/A;   TONSILLECTOMY AND  ADENOIDECTOMY  1940's   VASECTOMY      There were no vitals filed for this visit.   Subjective Assessment - 05/19/20 1507     Subjective Pt reports improving pain. He has been doing a lot of activity at home, bending, getting his pool opened. Has been doing HEP    Currently in Pain? No/denies    Pain Score 0-No pain                               [facility] Adult PT Treatment/Exercise - 05/19/20 0001       Exercises   Exercises Lumbar      Lumbar Exercises: Stretches   Active Hamstring Stretch 3 reps;30 seconds    Active Hamstring Stretch Limitations bil; seated    Lower Trunk Rotation Limitations 10x 3s holds      Lumbar Exercises: Aerobic   Recumbent Bike L 2 x  6 min;      Lumbar Exercises: Standing   Functional Squats 15 reps    Functional Squats Limitations education on form    Lifting 5 reps;From 12"    Lifting Weights (lbs) 20    Other Standing Lumbar Exercises standing hip hinge with dowel 20x      Lumbar Exercises: Supine   Ab Set 10 reps  AB Set Limitations Education on active TA contraction    Pelvic Tilt 10 reps    Pelvic Tilt Limitations cued for pelvic rotation    Bent Knee Raise 20 reps    Bent Knee Raise Limitations with TA    Bridge 10 reps    Other Supine Lumbar Exercises lumbar spine rotation with knees flexed x 10      Lumbar Exercises: Quadruped   Madcat/Old Horse 20 reps      Manual Therapy   Manual Therapy Joint mobilization    Joint Mobilization CPA and UPA glides for lumbar spine; grade II-III; long leg distraction x 2 min bil, lumbar                    PT Education - 05/19/20 1508     Education Details Reviewed HEP, reviewed bend, squat, lift mechanics    Person(s) Educated Patient    Methods Explanation;Demonstration;Tactile cues;Verbal cues    Comprehension Verbalized understanding;Returned demonstration;Verbal cues required;Tactile cues required;Need further instruction              PT Short Term  Goals - 05/05/20 1525       PT SHORT TERM GOAL #1   Title Pt will be able to perform initial HEP independently    Time 2    Period Weeks    Status New    Target Date 05/19/20               PT Long Term Goals - 05/05/20 1526       PT LONG TERM GOAL #1   Title Pt will be able to perform final HEP indpendently    Time 6    Period Weeks    Status New    Target Date 06/16/20      PT LONG TERM GOAL #2   Title Pt will be able to perform a squat and reach an object from the ground with optimal body mechanics and no pain    Time 6    Period Weeks    Status New    Target Date 06/16/20      PT LONG TERM GOAL #3   Title Pt will report 0-1/10 pain with forward bending and performing household chores    Time 6    Period Weeks    Status New    Target Date 06/16/20      PT LONG TERM GOAL #4   Title Pt will be able to swim for 1 hour without pain in his low back    Time 6    Period Weeks    Status New    Target Date 06/16/20                   Plan - 05/19/20 1510     Clinical Impression Statement Pt with some improvments in lumbar mobility with exercises. He continues to require mod-max cuing for performing pelvic tilt, TA, and hip hinge motions. Pt with improved posture with hip hinge and functional squat today, with no back pain. Pt to benefit from continued care to improve stiffness, strength, and functional mobility.    Personal Factors and Comorbidities Time since onset of injury/illness/exacerbation    Examination-Activity Limitations Bend;Sit;Squat    Examination-Participation Restrictions Cleaning;Laundry;Yard Work;Meal Prep    Stability/Clinical Decision Making Stable/Uncomplicated    Rehab Potential Good    PT Frequency 2x / week    PT Duration 6 weeks    PT Treatment/Interventions ADLs/Self Care Home Management;Aquatic  Therapy;Cryotherapy;Electrical Stimulation;Iontophoresis 68m/ml Dexamethasone;Moist Heat;Traction;Ultrasound;Gait training;Stair  training;Functional mobility training;Therapeutic activities;Therapeutic exercise;Balance training;Neuromuscular re-education;Patient/family education;Manual techniques;Passive range of motion;Dry needling;Taping;Spinal Manipulations;Joint Manipulations    PT Next Visit Plan Review cat cow exercise, squats, lifting, hip hinge with dowel, manual traction on bil hips    Consulted and Agree with Plan of Care Patient             Patient will benefit from skilled therapeutic intervention in order to improve the following deficits and impairments:  Increased muscle spasms,Pain,Improper body mechanics,Postural dysfunction,Decreased range of motion,Impaired flexibility,Decreased strength,Decreased activity tolerance  Visit Diagnosis: Pain, lumbar region  Muscle weakness (generalized)     Problem List Patient Active Problem List   Diagnosis Date Noted   TMJ syndrome 03/05/2017   Hemorrhoids 01/21/2014   CAD (coronary artery disease) 05/22/2013   Angina at rest ([facility] 05/20/2013   Unstable angina ([place] 05/20/2013   Allergic rhinitis 04/29/2010   GERD 10/05/2008   MICROALBUMINURIA 07/22/2008   BACK PAIN, LUMBAR 06/26/2008   MICROSCOPIC HEMATURIA 03/20/2007   Prediabetes 12/13/2006   Polycythemia vera ([place] 11/26/2006   Hyperlipidemia 11/26/2006   Gout 11/26/2006   Essential hypertension 11/26/2006   DIVERTICULOSIS, COLON 11/26/2006   INGUINAL PAIN, RIGHT 11/26/2006   COLONIC POLYPS, HX OF 11/26/2006   NEPHROLITHIASIS, HX OF 11/26/2006    LLyndee Hensen PT, DPT 3:12 PM  05/19/20    [place] [address]Phone: [phone]  Fax:  [phone] Name: LFREDDERICK SWANGERMRN: [id number] of Birth: [birth year] PHYSICAL THERAPY DISCHARGE SUMMARY  Visits from Start of Care:3 Plan: Patient agrees to discharge.  Patient goals were partially met. Patient is being discharged due to meeting the  stated rehab goals.  Pt did not return after visit 3.    LLyndee Hensen PT, DPT 2:24 PM  12/30/20

## 2020-05-25 ENCOUNTER — Telehealth: Payer: Self-pay | Admitting: Pharmacist

## 2020-05-25 NOTE — Chronic Care Management (AMB) (Signed)
    Chronic Care Management Pharmacy Assistant   Name: Alexander [name]  MRN: [id number] DOB: Jan 07, 1940  Reason for Encounter: General Adherence Call       Recent office visits:  . 03.11.2022 [name], NP patient present for annual exam  Recent consult visits:  . [phone] [name], MD Oncology patient present for follow up appointment . 05.04.2022 [name], PT Physical Therapy patient present for initial evaluation 12 weeks for once a week . 02.03.2022 [name], MD Oncology-patient was present for a follow up visit. Patient recently had COVID.  Hospital visits:  None in previous 6 months  Medications: Outpatient Encounter Medications as of 05/25/2020  Medication Sig  . methylPREDNISolone (MEDROL DOSEPAK) 4 MG TBPK tablet Take as directed  . ACCU-CHEK SOFTCLIX LANCETS lancets Use to test blood glucose once daily  . allopurinol (ZYLOPRIM) 100 MG tablet Take 1 tablet (100 mg total) by mouth daily.  [name] amLODipine (NORVASC) 10 MG tablet Take 1 tablet (10 mg total) by mouth daily.  [name] aspirin EC 81 MG tablet Take 1 tablet (81 mg total) by mouth daily.  . Blood Glucose Monitoring Suppl (ACCU-CHEK AVIVA PLUS) w/Device KIT Use to test blood glucose once daily  . Coenzyme Q10 300 MG CAPS Take 1 capsule by mouth daily.   [name] glucose blood (ACCU-CHEK AVIVA) test strip Use to test blood glucose once daily  . hydrocortisone (ANUSOL-HC) 25 MG suppository Place 25 mg rectally daily as needed for hemorrhoids or anal itching.  [name] lisinopril (ZESTRIL) 10 MG tablet Take 1 tablet (10 mg total) by mouth daily.  . pantoprazole (PROTONIX) 40 MG tablet Take 1 tablet (40 mg total) by mouth daily.  . pravastatin (PRAVACHOL) 40 MG tablet Take 1 tablet (40 mg total) by mouth daily.  [name] PREVIDENT 5000 ENAMEL PROTECT 1.1-5 % PSTE See admin instructions.  [name] PROCTO-MED HC 2.5 % rectal cream APPLY 1 CREAM RECTALLY TWICE DAILY   No facility-administered encounter medications on file as  of 05/25/2020.   I spoke with the patient about medication adherence. He states that he is doing well. He stated that his insurance company did a medication review with him yesterday. He states that he is not having any current issues with his medications. His wife sees the same PCP and they are going to ask some questions this Friday. There have been no falls or injuries since his last CPP or PCP visit. He states he would like to discuss with his PCP about the care management. There are no recent emergency room or urgent care visits. He denies any issues with his current pharmacy.  Star Rating Drugs:  Dispensed Quantity Pharmacy  Lisinopril 10 mg 03.11.2022 [facility]  Pravastatin 40 mg 03.11.2022 [place] ([place] [place], [place] Pharmacist Assistant [phone]

## 2020-05-26 ENCOUNTER — Encounter: Payer: Medicare HMO | Admitting: Physical Therapy

## 2020-07-21 ENCOUNTER — Encounter: Payer: Self-pay | Admitting: Adult Health

## 2020-08-18 ENCOUNTER — Telehealth: Payer: Self-pay | Admitting: Adult Health

## 2020-08-18 NOTE — Telephone Encounter (Signed)
Spoke to patient to schedule Medicare Annual Wellness Visit (AWV) either virtually or in office.   Last AWV 09/26/19  please schedule at anytime with [facility] Nurse Health Advisor 1 or 2  [employer] insurance AWV can be calendar year   This should be a 45 minute visit.

## 2020-08-26 ENCOUNTER — Telehealth: Payer: Self-pay | Admitting: Pharmacist

## 2020-08-26 ENCOUNTER — Telehealth: Payer: Self-pay | Admitting: Adult Health

## 2020-08-26 NOTE — Telephone Encounter (Signed)
Left message asking pt to call I gave number [phone]   per 08/26/20 email from [place] does 365 not calendar year   09/08/20 AWV needs to be r/s after 09/25/20

## 2020-08-26 NOTE — Progress Notes (Signed)
    Chronic Care Management Pharmacy Assistant   Name: Alexander [name]  MRN: [id number] DOB: [birth year]  Reason for Encounter: General Assessment Call   Recent office visits:  None  Recent consult visits:  None  Hospital visits:  None in previous 6 months  Medications: Outpatient Encounter Medications as of 08/26/2020  Medication Sig   methylPREDNISolone (MEDROL DOSEPAK) 4 MG TBPK tablet Take as directed   ACCU-CHEK SOFTCLIX LANCETS lancets Use to test blood glucose once daily   allopurinol (ZYLOPRIM) 100 MG tablet Take 1 tablet (100 mg total) by mouth daily.   amLODipine (NORVASC) 10 MG tablet Take 1 tablet (10 mg total) by mouth daily.   aspirin EC 81 MG tablet Take 1 tablet (81 mg total) by mouth daily.   Blood Glucose Monitoring Suppl (ACCU-CHEK AVIVA PLUS) w/Device KIT Use to test blood glucose once daily   Coenzyme Q10 300 MG CAPS Take 1 capsule by mouth daily.    glucose blood (ACCU-CHEK AVIVA) test strip Use to test blood glucose once daily   hydrocortisone (ANUSOL-HC) 25 MG suppository Place 25 mg rectally daily as needed for hemorrhoids or anal itching.   lisinopril (ZESTRIL) 10 MG tablet Take 1 tablet (10 mg total) by mouth daily.   pantoprazole (PROTONIX) 40 MG tablet Take 1 tablet (40 mg total) by mouth daily.   pravastatin (PRAVACHOL) 40 MG tablet Take 1 tablet (40 mg total) by mouth daily.   PREVIDENT 5000 ENAMEL PROTECT 1.1-5 % PSTE See admin instructions.   PROCTO-MED HC 2.5 % rectal cream APPLY 1 CREAM RECTALLY TWICE DAILY   No facility-administered encounter medications on file as of 08/26/2020.  Notes: Call to patient he reports he is doing very well. He reports he has not noticed any side effects from his medications however does not like the possible side effects associated with taking his Asprin. Reports he has spoken to [place] about it and has ben counseled to continue so he is. He reports he has been taking his medications as prescribed. He reports he does  not check his pressures at home at all ever nor his sugars. He reports his A1C for the past few years has been between 6.1-6.3. He reports he is not in need of any fills on his medications. Offered a follow up appointment with the pharmacist in November via telephone call he accepted.  Care Gaps: Colonoscopy - Overdue COVID Vaccine #4(Pfizer) - Overdue Flu Vaccine - Overdue CCM F/U Call Scheduled- 11-08-20 at 11:30 AWV-  Sheduled 09-08-20   Star Rating Drugs: Lisinopril (Zestril) 10 mg - Last filled 01-12-19 90 DS at [facility] Pravastatin (Pravachol) 40 mg - Last filled 04-13-19 90 DS at [facility]  Call to [name] spoke to [place] she advised: Lisinopril- Last filled 07-19-20 90 DS Pravastatin -Last filled 03-15-20 365 DS  Notes: Attempted to reach on 08-26-20 left VM  [place] Clinical Pharmacist Assistant [phone]

## 2020-09-08 ENCOUNTER — Inpatient Hospital Stay (HOSPITAL_BASED_OUTPATIENT_CLINIC_OR_DEPARTMENT_OTHER): Payer: Medicare HMO | Admitting: Hematology & Oncology

## 2020-09-08 ENCOUNTER — Encounter: Payer: Self-pay | Admitting: Hematology & Oncology

## 2020-09-08 ENCOUNTER — Inpatient Hospital Stay: Payer: Medicare HMO

## 2020-09-08 ENCOUNTER — Inpatient Hospital Stay: Payer: Medicare HMO | Attending: Hematology & Oncology

## 2020-09-08 ENCOUNTER — Other Ambulatory Visit: Payer: Self-pay

## 2020-09-08 ENCOUNTER — Ambulatory Visit (INDEPENDENT_AMBULATORY_CARE_PROVIDER_SITE_OTHER): Payer: Medicare HMO

## 2020-09-08 VITALS — BP 133/66 | HR 50 | Temp 98.1°F | Resp 19 | Wt 176.0 lb

## 2020-09-08 VITALS — BP 122/72 | HR 60 | Temp 98.1°F | Ht 70.0 in | Wt 173.0 lb

## 2020-09-08 DIAGNOSIS — D751 Secondary polycythemia: Secondary | ICD-10-CM | POA: Diagnosis not present

## 2020-09-08 DIAGNOSIS — D45 Polycythemia vera: Secondary | ICD-10-CM | POA: Diagnosis not present

## 2020-09-08 DIAGNOSIS — Z0000 Encounter for general adult medical examination without abnormal findings: Secondary | ICD-10-CM | POA: Diagnosis not present

## 2020-09-08 DIAGNOSIS — Z7982 Long term (current) use of aspirin: Secondary | ICD-10-CM | POA: Insufficient documentation

## 2020-09-08 LAB — CBC WITH DIFFERENTIAL (CANCER CENTER ONLY)
Abs Immature Granulocytes: 0.04 10*3/uL (ref 0.00–0.07)
Basophils Absolute: 0.1 10*3/uL (ref 0.0–0.1)
Basophils Relative: 1 %
Eosinophils Absolute: 0.3 10*3/uL (ref 0.0–0.5)
Eosinophils Relative: 3 %
HCT: 43.2 % (ref 39.0–52.0)
Hemoglobin: 14.3 g/dL (ref 13.0–17.0)
Immature Granulocytes: 0 %
Lymphocytes Relative: 27 %
Lymphs Abs: 2.5 10*3/uL (ref 0.7–4.0)
MCH: 30.8 pg (ref 26.0–34.0)
MCHC: 33.1 g/dL (ref 30.0–36.0)
MCV: 92.9 fL (ref 80.0–100.0)
Monocytes Absolute: 1.1 10*3/uL — ABNORMAL HIGH (ref 0.1–1.0)
Monocytes Relative: 12 %
Neutro Abs: 5.2 10*3/uL (ref 1.7–7.7)
Neutrophils Relative %: 57 %
Platelet Count: 280 10*3/uL (ref 150–400)
RBC: 4.65 MIL/uL (ref 4.22–5.81)
RDW: 15.3 % (ref 11.5–15.5)
WBC Count: 9.1 10*3/uL (ref 4.0–10.5)
nRBC: 0 % (ref 0.0–0.2)

## 2020-09-08 LAB — CMP (CANCER CENTER ONLY)
ALT: 14 U/L (ref 0–44)
AST: 14 U/L — ABNORMAL LOW (ref 15–41)
Albumin: 4.2 g/dL (ref 3.5–5.0)
Alkaline Phosphatase: 74 U/L (ref 38–126)
Anion gap: 8 (ref 5–15)
BUN: 26 mg/dL — ABNORMAL HIGH (ref 8–23)
CO2: 28 mmol/L (ref 22–32)
Calcium: 9.6 mg/dL (ref 8.9–10.3)
Chloride: 103 mmol/L (ref 98–111)
Creatinine: 1.24 mg/dL (ref 0.61–1.24)
GFR, Estimated: 59 mL/min — ABNORMAL LOW (ref >60)
Glucose, Bld: 181 mg/dL — ABNORMAL HIGH (ref 70–99)
Potassium: 4.3 mmol/L (ref 3.5–5.1)
Sodium: 139 mmol/L (ref 135–145)
Total Bilirubin: 0.5 mg/dL (ref 0.3–1.2)
Total Protein: 7.2 g/dL (ref 6.5–8.1)

## 2020-09-08 NOTE — Progress Notes (Signed)
Subjective:   Alexander [name] is a 80 y.o. male who presents for an Initial Medicare Annual Wellness Visit.  Review of Systems    N/a       Objective:    There were no vitals filed for this visit. There is no height or weight on file to calculate BMI.  Advanced Directives 05/10/2020 05/05/2020 02/05/2020 11/07/2019 09/26/2019 07/25/2019 03/26/2019  Does Patient Have a Medical Advance Directive? _0  Yes Yes  Type of Advance Directive Living will [place];Living will [place];Living will [place];Living will [place];Living will [place];Living will [place];Living will  Does patient want to make changes to medical advance directive? No - Patient declined No - Patient declined - No - Patient declined - - No - Patient declined  Copy of [place] in Chart? - No - copy requested - - No - copy requested - -  Would patient like information on creating a medical advance directive? - - - - - - -  Pre-existing out of facility DNR order (yellow form or pink MOST form) - - - - - - -    Current Medications (verified) Outpatient Encounter Medications as of 09/08/2020  Medication Sig   methylPREDNISolone (MEDROL DOSEPAK) 4 MG TBPK tablet Take as directed   ACCU-CHEK SOFTCLIX LANCETS lancets Use to test blood glucose once daily   allopurinol (ZYLOPRIM) 100 MG tablet Take 1 tablet (100 mg total) by mouth daily.   amLODipine (NORVASC) 10 MG tablet Take 1 tablet (10 mg total) by mouth daily.   aspirin EC 81 MG tablet Take 1 tablet (81 mg total) by mouth daily.   Blood Glucose Monitoring Suppl (ACCU-CHEK AVIVA PLUS) w/Device KIT Use to test blood glucose once daily   Coenzyme Q10 300 MG CAPS Take 1 capsule by mouth daily.    glucose blood (ACCU-CHEK AVIVA) test strip Use to test blood glucose once daily   hydrocortisone (ANUSOL-HC) 25 MG suppository Place  25 mg rectally daily as needed for hemorrhoids or anal itching.   lisinopril (ZESTRIL) 10 MG tablet Take 1 tablet (10 mg total) by mouth daily.   pantoprazole (PROTONIX) 40 MG tablet Take 1 tablet (40 mg total) by mouth daily.   pravastatin (PRAVACHOL) 40 MG tablet Take 1 tablet (40 mg total) by mouth daily.   PREVIDENT 5000 ENAMEL PROTECT 1.1-5 % PSTE See admin instructions.   PROCTO-MED HC 2.5 % rectal cream APPLY 1 CREAM RECTALLY TWICE DAILY   No facility-administered encounter medications on file as of 09/08/2020.    Allergies (verified) Metformin and related   History: Past Medical History:  Diagnosis Date   Arthritis    "minor; shoulders primarily" (05/20/2013)   CAD (coronary artery disease)    a. 05/2013 - Chest pain/unstable angina and dynamic EKG changes showing cath showing atherosclerotic coronary artery disease manifested as diffuse ectasia, normal EF.   Diverticulosis of colon    GERD (gastroesophageal reflux disease)    GI bleed    secondary to Aspirin   Gout    Hematuria, microscopic    Hemorrhoids    Hyperglycemia    Hyperlipidemia    Hypertension    Lumbar back pain    Microalbuminuria    Overweight(278.02)    Polycythemia rubra vera ([place])    Tubular adenoma of colon 07/2012   Past Surgical History:  Procedure Laterality Date   CARDIAC CATHETERIZATION  05/20/2013  CATARACT EXTRACTION W/ INTRAOCULAR LENS  IMPLANT, BILATERAL Bilateral 2008   CYSTECTOMY  ~ 2000   " SEBACEOUS cyst removed from between shoulder blades"   EXCISIONAL HEMORRHOIDECTOMY  1970's   INGUINAL HERNIA REPAIR Right ~ 2010   LEFT HEART CATHETERIZATION WITH CORONARY ANGIOGRAM N/A 05/20/2013   Procedure: LEFT HEART CATHETERIZATION WITH CORONARY ANGIOGRAM;  Surgeon: Peter M Martinique, MD;  Location[facility] CATH LAB;  Service: Cardiovascular;  Laterality: N/A;   TONSILLECTOMY AND ADENOIDECTOMY  54's   VASECTOMY     Family History  Problem Relation Age of Onset   Colon cancer Mother        died at  45, colorectal   Diabetes Mother    Heart disease Father 57       MI   Diabetes Father    Stomach cancer Maternal Grandfather    Cancer Maternal Uncle    Social History   Socioeconomic History   Marital status: Married    Spouse name: Not on file   Number of children: 3   Years of education: Not on file   Highest education level: Not on file  Occupational History   Occupation: retired Lobbyist: RETIRED  Tobacco Use   Smoking status: Never   Smokeless tobacco: Never   Tobacco comments:    NEVER USED TOBACCO  Vaping Use   Vaping Use: Never used  Substance and Sexual Activity   Alcohol use: No    Alcohol/week: 0.0 standard drinks   Drug use: No   Sexual activity: Yes    Partners: Female  Other Topics Concern   Not on file  Social History Narrative   Marriedfor 42 years    Daughter, Higher education careers adviser (pediatrician--lives in [place], [place]), one in [place] - Engineer, water. One in [place] - Chief Strategy Officer          Social Determinants of Radio broadcast assistant Strain: Not on file  Food Insecurity: No Food Insecurity   Worried About Charity fundraiser in the Last Year: Never true   Arboriculturist in the Last Year: Never true  Transportation Needs: Not on file  Physical Activity: Sufficiently Active   Days of Exercise per Week: 7 days   Minutes of Exercise per Session: 60 min  Stress: No Stress Concern Present   Feeling of Stress : Not at all  Social Connections: Moderately Integrated   Frequency of Communication with Friends and Family: More than three times a week   Frequency of Social Gatherings with Friends and Family: Twice a week   Attends Religious Services: More than 4 times per year   Active Member of [employer] or Organizations: No   Attends Music therapist: Never   Marital Status: Married    Tobacco Counseling Counseling given: Not Answered Tobacco comments: NEVER USED TOBACCO   Clinical Intake:                 Diabetic?no          Activities of Daily Living In your present state of health, do you have any difficulty performing the following activities: 09/26/2019  Hearing? N  Vision? N  Difficulty concentrating or making decisions? N  Walking or climbing stairs? N  Dressing or bathing? N  Doing errands, shopping? N  Preparing Food and eating ? N  Using the Toilet? N  In the past six months, have you accidently leaked urine? N  Do you have problems with loss of bowel control? N  Managing your Medications? N  Managing your Finances? N  Housekeeping or managing your Housekeeping? N  Some recent data might be hidden    Patient Care Team: [name], NP as PCP - General (Family Medicine) Jerline Pain, MD as PCP - Cardiology (Cardiology) Earnie Larsson, [facility] as Pharmacist (Pharmacist)  Indicate any recent Medical Services you may have received from other than [facility] providers in the past year (date may be approximate).     Assessment:   This is a routine wellness examination for [employer].  Hearing/Vision screen No results found.  Dietary issues and exercise activities discussed:     Goals Addressed   None    Depression Screen PHQ 2/9 Scores 03/12/2020 09/26/2019 09/26/2019 09/27/2018 09/06/2017 08/31/2016 04/22/2014  PHQ - 2 Score 0 0 0 0 0 0 0  PHQ- 9 Score - 0 - - - - -    Fall Risk Fall Risk  03/12/2020 09/26/2019 09/27/2018 09/06/2017 08/31/2016  Falls in the past year? 1 0 0 No Yes  Number falls in past yr: 0 0 0 - 1  Injury with Fall? - 0 0 - -  Risk for fall due to : - Medication side effect - - -  Follow up - Falls evaluation completed;Falls prevention discussed Falls evaluation completed - -    FALL RISK PREVENTION PERTAINING TO THE HOME:  Any stairs in or around the home? No  If so, are there any without handrails? No  Home free of loose throw rugs in walkways, pet beds, electrical cords, etc? Yes  Adequate lighting in your home to reduce risk of falls? Yes   ASSISTIVE DEVICES  UTILIZED TO PREVENT FALLS:  Life alert? No  Use of a cane, walker or w/c? No  Grab bars in the bathroom? Yes  Shower chair or bench in shower? No  Elevated toilet seat or a handicapped toilet? No   TIMED UP AND GO:  Was the test performed? Yes .  Length of time to ambulate 10 feet: 7 sec.   Gait steady and fast without use of assistive device  Cognitive Function:  Normal cognitive status assessed by direct observation by this Nurse Health Advisor. No abnormalities found.        Immunizations Immunization History  Administered Date(s) Administered   Fluad Quad(high Dose 65+) 08/30/2018, 11/19/2019   Influenza Split 09/14/2011   Influenza Whole 10/04/2006, 09/26/2007, 09/16/2008   Influenza, High Dose Seasonal PF 09/20/2013, 09/25/2014, 10/11/2015, 08/31/2016, 09/06/2017   Influenza,inj,Quad PF,6+ Mos 09/11/2012   PFIZER(Purple Top)SARS-COV-2 Vaccination 02/09/2019, 03/06/2019, 10/06/2019   Pneumococcal Conjugate-13 10/03/2012, 07/18/2013, 04/22/2014   Pneumococcal Polysaccharide-23 12/18/2006   Tdap 01/17/2012   Zoster Recombinat (Shingrix) 05/29/2017, 11/09/2017   Zoster, Live 09/22/2010    TDAP status: Up to date  Flu Vaccine status: Up to date  Pneumococcal vaccine status: Up to date  Covid-19 vaccine status: Completed vaccines  Qualifies for Shingles Vaccine? Yes   Zostavax completed Yes   Shingrix Completed?: Yes  Screening Tests Health Maintenance  Topic Date Due   COLONOSCOPY (Pts 45-8yr Insurance coverage will need to be confirmed)  07/16/2017   COVID-19 Vaccine (4 - Booster for Pfizer series) 01/06/2020   INFLUENZA VACCINE  08/02/2020   TETANUS/TDAP  01/16/2022   PNA vac Low Risk Adult  Completed   Zoster Vaccines- Shingrix  Completed   HPV VACCINES  Aged Out    Health Maintenance  Health Maintenance Due  Topic Date Due   COLONOSCOPY (Pts 45-474yrInsurance coverage will need to  be confirmed)  07/16/2017   COVID-19 Vaccine (4 - Booster for  Pfizer series) 01/06/2020   INFLUENZA VACCINE  08/02/2020    Colorectal cancer screening: No longer required.   Lung Cancer Screening: (Low Dose CT Chest recommended if Age 98-80 years, 30 pack-year currently smoking OR have quit w/in 15years.) does not qualify.   Lung Cancer Screening Referral: n/a  Additional Screening:  Hepatitis C Screening: does not qualify; Completed n/a  Vision Screening: Recommended annual ophthalmology exams for early detection of glaucoma and other disorders of the eye. Is the patient up to date with their annual eye exam?  Yes  Who is the provider or what is the name of the office in which the patient attends annual eye exams? Dr.Ablot If pt is not established with a provider, would they like to be referred to a provider to establish care? No .   Dental Screening: Recommended annual dental exams for proper oral hygiene  Community Resource Referral / Chronic Care Management: CRR required this visit?  No   CCM required this visit?  No      Plan:     I have personally reviewed and noted the following in the patient's chart:   Medical and social history Use of alcohol, tobacco or illicit drugs  Current medications and supplements including opioid prescriptions. Patient is not currently taking opioid prescriptions. Functional ability and status Nutritional status Physical activity Advanced directives List of other physicians Hospitalizations, surgeries, and ER visits in previous 12 months Vitals Screenings to include cognitive, depression, and falls Referrals and appointments  In addition, I have reviewed and discussed with patient certain preventive protocols, quality metrics, and best practice recommendations. A written personalized care plan for preventive services as well as general preventive health recommendations were provided to patient.     Randel Pigg, LPN   06/09/7371   Nurse Notes: none

## 2020-09-08 NOTE — Patient Instructions (Signed)
[name] , Thank you for taking time to come for your Medicare Wellness Visit. I appreciate your ongoing commitment to your health goals. Please review the following plan we discussed and let me know if I can assist you in the future.   Screening recommendations/referrals: Colonoscopy: no longer required  Recommended yearly ophthalmology/optometry visit for glaucoma screening and checkup Recommended yearly dental visit for hygiene and checkup  Vaccinations: Influenza vaccine: due in fall 2022 Pneumococcal vaccine: completed series  Tdap vaccine: 01/17/2012 Shingles vaccine: completed series     Advanced directives: will provide copies   Conditions/risks identified: none   Next appointment: none   Preventive Care 7 Years and Older, Male Preventive care refers to lifestyle choices and visits with your health care provider that can promote health and wellness. What does preventive care include? A yearly physical exam. This is also called an annual well check. Dental exams once or twice a year. Routine eye exams. Ask your health care provider how often you should have your eyes checked. Personal lifestyle choices, including: Daily care of your teeth and gums. Regular physical activity. Eating a healthy diet. Avoiding tobacco and drug use. Limiting alcohol use. Practicing safe sex. Taking low doses of aspirin every day. Taking vitamin and mineral supplements as recommended by your health care provider. What happens during an annual well check? The services and screenings done by your health care provider during your annual well check will depend on your age, overall health, lifestyle risk factors, and family history of disease. Counseling  Your health care provider may ask you questions about your: Alcohol use. Tobacco use. Drug use. Emotional well-being. Home and relationship well-being. Sexual activity. Eating habits. History of falls. Memory and ability to understand  (cognition). Work and work Statistician. Screening  You may have the following tests or measurements: Height, weight, and BMI. Blood pressure. Lipid and cholesterol levels. These may be checked every 5 years, or more frequently if you are over 35 years old. Skin check. Lung cancer screening. You may have this screening every year starting at age 41 if you have a 30-pack-year history of smoking and currently smoke or have quit within the past 15 years. Fecal occult blood test (FOBT) of the stool. You may have this test every year starting at age 40. Flexible sigmoidoscopy or colonoscopy. You may have a sigmoidoscopy every 5 years or a colonoscopy every 10 years starting at age 47. Prostate cancer screening. Recommendations will vary depending on your family history and other risks. Hepatitis C blood test. Hepatitis B blood test. Sexually transmitted disease (STD) testing. Diabetes screening. This is done by checking your blood sugar (glucose) after you have not eaten for a while (fasting). You may have this done every 1-3 years. Abdominal aortic aneurysm (AAA) screening. You may need this if you are a current or former smoker. Osteoporosis. You may be screened starting at age 39 if you are at high risk. Talk with your health care provider about your test results, treatment options, and if necessary, the need for more tests. Vaccines  Your health care provider may recommend certain vaccines, such as: Influenza vaccine. This is recommended every year. Tetanus, diphtheria, and acellular pertussis (Tdap, Td) vaccine. You may need a Td booster every 10 years. Zoster vaccine. You may need this after age 86. Pneumococcal 13-valent conjugate (PCV13) vaccine. One dose is recommended after age 9. Pneumococcal polysaccharide (PPSV23) vaccine. One dose is recommended after age 67. Talk to your health care provider about which screenings  and vaccines you need and how often you need them. This  information is not intended to replace advice given to you by your health care provider. Make sure you discuss any questions you have with your health care provider. Document Released: 01/15/2015 Document Revised: 09/08/2015 Document Reviewed: 10/20/2014 Elsevier Interactive Patient Education  2017 [place] Prevention in the Home Falls can cause injuries. They can happen to people of all ages. There are many things you can do to make your home safe and to help prevent falls. What can I do on the outside of my home? Regularly fix the edges of walkways and driveways and fix any cracks. Remove anything that might make you trip as you walk through a door, such as a raised step or threshold. Trim any bushes or trees on the path to your home. Use bright outdoor lighting. Clear any walking paths of anything that might make someone trip, such as rocks or tools. Regularly check to see if handrails are loose or broken. Make sure that both sides of any steps have handrails. Any raised decks and porches should have guardrails on the edges. Have any leaves, snow, or ice cleared regularly. Use sand or salt on walking paths during winter. Clean up any spills in your garage right away. This includes oil or grease spills. What can I do in the bathroom? Use night lights. Install grab bars by the toilet and in the tub and shower. Do not use towel bars as grab bars. Use non-skid mats or decals in the tub or shower. If you need to sit down in the shower, use a plastic, non-slip stool. Keep the floor dry. Clean up any water that spills on the floor as soon as it happens. Remove soap buildup in the tub or shower regularly. Attach bath mats securely with double-sided non-slip rug tape. Do not have throw rugs and other things on the floor that can make you trip. What can I do in the bedroom? Use night lights. Make sure that you have a light by your bed that is easy to reach. Do not use any sheets or  blankets that are too big for your bed. They should not hang down onto the floor. Have a firm chair that has side arms. You can use this for support while you get dressed. Do not have throw rugs and other things on the floor that can make you trip. What can I do in the kitchen? Clean up any spills right away. Avoid walking on wet floors. Keep items that you use a lot in easy-to-reach places. If you need to reach something above you, use a strong step stool that has a grab bar. Keep electrical cords out of the way. Do not use floor polish or wax that makes floors slippery. If you must use wax, use non-skid floor wax. Do not have throw rugs and other things on the floor that can make you trip. What can I do with my stairs? Do not leave any items on the stairs. Make sure that there are handrails on both sides of the stairs and use them. Fix handrails that are broken or loose. Make sure that handrails are as long as the stairways. Check any carpeting to make sure that it is firmly attached to the stairs. Fix any carpet that is loose or worn. Avoid having throw rugs at the top or bottom of the stairs. If you do have throw rugs, attach them to the floor with carpet tape.  Make sure that you have a light switch at the top of the stairs and the bottom of the stairs. If you do not have them, ask someone to add them for you. What else can I do to help prevent falls? Wear shoes that: Do not have high heels. Have rubber bottoms. Are comfortable and fit you well. Are closed at the toe. Do not wear sandals. If you use a stepladder: Make sure that it is fully opened. Do not climb a closed stepladder. Make sure that both sides of the stepladder are locked into place. Ask someone to hold it for you, if possible. Clearly mark and make sure that you can see: Any grab bars or handrails. First and last steps. Where the edge of each step is. Use tools that help you move around (mobility aids) if they are  needed. These include: Canes. Walkers. Scooters. Crutches. Turn on the lights when you go into a dark area. Replace any light bulbs as soon as they burn out. Set up your furniture so you have a clear path. Avoid moving your furniture around. If any of your floors are uneven, fix them. If there are any pets around you, be aware of where they are. Review your medicines with your doctor. Some medicines can make you feel dizzy. This can increase your chance of falling. Ask your doctor what other things that you can do to help prevent falls. This information is not intended to replace advice given to you by your health care provider. Make sure you discuss any questions you have with your health care provider. Document Released: 10/15/2008 Document Revised: 05/27/2015 Document Reviewed: 01/23/2014 Elsevier Interactive Patient Education  2017 [employer].

## 2020-09-08 NOTE — Progress Notes (Signed)
Hematology and Oncology Follow Up Visit  Alexander [name] [id number] 13-Nov-1940 80 y.o. 09/08/2020   Principle Diagnosis:  Secondary Polycythemia/Erythrocytosis-JAK2 negative  Current Therapy:   Phlebotomy to maintain hematocrit below 45% - 12/29/2015 Aspirin 81 mg by mouth daily     Interim History:  Mr.  Alexander [name] is in for a followup.  He has had a very good summer.  As always, he does a lot of swimming.  He is going to have to close up his pool soon.  I am just sorry about this.  He feels well.  He has had no chest pain.  He is no nausea or vomiting.  He has had no change in bowel or bladder habits.  There is been no issues with fever sweats or chills.  He has had no problems with COVID.  He has had no problems with headache.  His wife is having some problems.  She did have some health issues, mostly associated with arthritis.  He tried to donate blood to the [employer].  Unfortunately would not take his blood secondary to him having polycythemia.  Overall, I would say his performance status is [name].    Medications:  Current Outpatient Medications:    methylPREDNISolone (MEDROL DOSEPAK) 4 MG TBPK tablet, Take as directed (Patient not taking: Reported on 09/08/2020), Disp: 21 tablet, Rfl: 0   ACCU-CHEK SOFTCLIX LANCETS lancets, Use to test blood glucose once daily, Disp: 100 each, Rfl: 3   allopurinol (ZYLOPRIM) 100 MG tablet, Take 1 tablet (100 mg total) by mouth daily., Disp: 365 tablet, Rfl: 0   amLODipine (NORVASC) 10 MG tablet, Take 1 tablet (10 mg total) by mouth daily., Disp: 90 tablet, Rfl: 3   aspirin EC 81 MG tablet, Take 1 tablet (81 mg total) by mouth daily., Disp: 90 tablet, Rfl: 3   Blood Glucose Monitoring Suppl (ACCU-CHEK AVIVA PLUS) w/Device KIT, Use to test blood glucose once daily, Disp: 1 kit, Rfl: 0   Coenzyme Q10 300 MG CAPS, Take 1 capsule by mouth daily. , Disp: , Rfl:    glucose blood (ACCU-CHEK AVIVA) test strip, Use to test blood glucose once daily,  Disp: 100 each, Rfl: 3   hydrocortisone (ANUSOL-HC) 25 MG suppository, Place 25 mg rectally daily as needed for hemorrhoids or anal itching., Disp: , Rfl:    lisinopril (ZESTRIL) 10 MG tablet, Take 1 tablet (10 mg total) by mouth daily., Disp: 90 tablet, Rfl: 3   pantoprazole (PROTONIX) 40 MG tablet, Take 1 tablet (40 mg total) by mouth daily., Disp: 365 tablet, Rfl: 0   pravastatin (PRAVACHOL) 40 MG tablet, Take 1 tablet (40 mg total) by mouth daily., Disp: 365 tablet, Rfl: 0   PREVIDENT 5000 ENAMEL PROTECT 1.1-5 % PSTE, See admin instructions., Disp: , Rfl:    PROCTO-MED HC 2.5 % rectal cream, APPLY 1 CREAM RECTALLY TWICE DAILY, Disp: 90 g, Rfl: 3  Allergies:  Allergies  Allergen Reactions   Metformin And Related Diarrhea    Past Medical History, Surgical history, Social history, and Family History were reviewed and updated.  Review of Systems: Review of Systems  Constitutional: Negative.   HENT: Negative.    Eyes: Negative.   Respiratory: Negative.    Cardiovascular: Negative.   Gastrointestinal: Negative.   Genitourinary: Negative.   Musculoskeletal: Negative.   Skin: Negative.   Neurological: Negative.   Endo/Heme/Allergies: Negative.   Psychiatric/Behavioral: Negative.     Physical Exam:  weight is 176 lb (79.8 kg). His oral temperature is 98.1  F (36.7 C). His blood pressure is 133/66 and his pulse is 50 (abnormal). His respiration is 19 and oxygen saturation is 100%.   Physical Exam Vitals reviewed.  HENT:     Head: Normocephalic and atraumatic.  Eyes:     Pupils: Pupils are equal, round, and reactive to light.  Cardiovascular:     Rate and Rhythm: Normal rate and regular rhythm.     Heart sounds: Normal heart sounds.  Pulmonary:     Effort: Pulmonary effort is normal.     Breath sounds: Normal breath sounds.  Abdominal:     General: Bowel sounds are normal.     Palpations: Abdomen is soft.  Musculoskeletal:        General: No tenderness or deformity. Normal  range of motion.     Cervical back: Normal range of motion.  Lymphadenopathy:     Cervical: No cervical adenopathy.  Skin:    General: Skin is warm and dry.     Findings: No erythema or rash.  Neurological:     Mental Status: He is alert and oriented to person, place, and time.  Psychiatric:        Behavior: Behavior normal.        Thought Content: Thought content normal.        Judgment: Judgment normal.     Lab Results  Component Value Date   WBC 9.1 09/08/2020   HGB 14.3 09/08/2020   HCT 43.2 09/08/2020   MCV 92.9 09/08/2020   PLT 280 09/08/2020     Chemistry      Component Value Date/Time   NA 139 09/08/2020 1503   NA 144 11/29/2016 1054   NA 139 12/29/2015 0954   K 4.3 09/08/2020 1503   K 4.2 11/29/2016 1054   K 4.1 12/29/2015 0954   CL 103 09/08/2020 1503   CL 102 11/29/2016 1054   CO2 28 09/08/2020 1503   CO2 30 11/29/2016 1054   CO2 26 12/29/2015 0954   BUN 26 (H) 09/08/2020 1503   BUN 22 11/29/2016 1054   BUN 20.0 12/29/2015 0954   CREATININE 1.24 09/08/2020 1503   CREATININE 1.2 11/29/2016 1054   CREATININE 1.3 12/29/2015 0954      Component Value Date/Time   CALCIUM 9.6 09/08/2020 1503   CALCIUM 9.3 11/29/2016 1054   CALCIUM 9.5 12/29/2015 0954   ALKPHOS 74 09/08/2020 1503   ALKPHOS 92 (H) 11/29/2016 1054   ALKPHOS 98 12/29/2015 0954   AST 14 (L) 09/08/2020 1503   AST 16 12/29/2015 0954   ALT 14 09/08/2020 1503   ALT 20 11/29/2016 1054   ALT 13 12/29/2015 0954   BILITOT 0.5 09/08/2020 1503   BILITOT 0.71 12/29/2015 0954      Impression and Plan: [name] is 80 year old gentleman. He has secondary polycythemia/erythrocytosis. He is JAK2 negative.   He does not need to be phlebotomized right now.  I am happy about this.  I think we can really get him through until December.  I am just very happy that his quality of life is doing so well.  I just hope that his wife does a little bit better.     [name], MD 9/7/20223:58 PM

## 2020-09-09 ENCOUNTER — Telehealth: Payer: Self-pay

## 2020-09-09 LAB — IRON AND TIBC
Iron: 90 ug/dL (ref 42–163)
Saturation Ratios: 27 % (ref 20–55)
TIBC: 340 ug/dL (ref 202–409)
UIBC: 250 ug/dL (ref 117–376)

## 2020-09-09 LAB — FERRITIN: Ferritin: 71 ng/mL (ref 24–336)

## 2020-09-17 ENCOUNTER — Ambulatory Visit (INDEPENDENT_AMBULATORY_CARE_PROVIDER_SITE_OTHER): Payer: Medicare HMO

## 2020-09-17 ENCOUNTER — Other Ambulatory Visit: Payer: Self-pay

## 2020-09-17 DIAGNOSIS — Z23 Encounter for immunization: Secondary | ICD-10-CM

## 2020-09-27 ENCOUNTER — Other Ambulatory Visit: Payer: Self-pay

## 2020-09-28 ENCOUNTER — Ambulatory Visit (INDEPENDENT_AMBULATORY_CARE_PROVIDER_SITE_OTHER): Payer: Medicare HMO | Admitting: Adult Health

## 2020-09-28 ENCOUNTER — Encounter: Payer: Self-pay | Admitting: Adult Health

## 2020-09-28 VITALS — BP 138/80 | HR 68 | Temp 97.7°F | Ht 70.0 in | Wt 178.0 lb

## 2020-09-28 DIAGNOSIS — M5441 Lumbago with sciatica, right side: Secondary | ICD-10-CM

## 2020-09-28 MED ORDER — METHYLPREDNISOLONE 4 MG PO TBPK: ORAL_TABLET | ORAL | 0 refills | Status: DC

## 2020-09-28 NOTE — Progress Notes (Signed)
Subjective:    Patient ID: Alexander [name], male    DOB: 1940-07-17, 80 y.o.   MRN: [id number]  HPI 80 year old male who is being evaluated today for low back pain with right-sided sciatica.  Pain has been present for the last week or so.  Does not seem to be getting worse but no better either.  Worse with sitting for long periods of time and ambulation.  Does not interfere with activities of daily living.  Denies issues with bowel or bladder  Review of Systems See HPI   Past Medical History:  Diagnosis Date   Arthritis    "minor; shoulders primarily" (05/20/2013)   CAD (coronary artery disease)    a. 05/2013 - Chest pain/unstable angina and dynamic EKG changes showing cath showing atherosclerotic coronary artery disease manifested as diffuse ectasia, normal EF.   Diverticulosis of colon    GERD (gastroesophageal reflux disease)    GI bleed    secondary to Aspirin   Gout    Hematuria, microscopic    Hemorrhoids    Hyperglycemia    Hyperlipidemia    Hypertension    Lumbar back pain    Microalbuminuria    Overweight(278.02)    Polycythemia rubra vera (HCC)    Tubular adenoma of colon 07/2012    Social History   Socioeconomic History   Marital status: Married    Spouse name: Not on file   Number of children: 3   Years of education: Not on file   Highest education level: Not on file  Occupational History   Occupation: retired Lobbyist: RETIRED  Tobacco Use   Smoking status: Never   Smokeless tobacco: Never   Tobacco comments:    NEVER USED TOBACCO  Vaping Use   Vaping Use: Never used  Substance and Sexual Activity   Alcohol use: No    Alcohol/week: 0.0 standard drinks   Drug use: No   Sexual activity: Yes    Partners: Female  Other Topics Concern   Not on file  Social History Narrative   Marriedfor 35 years    Daughter, Higher education careers adviser (pediatrician--lives in [place], [place]), one in [place] - Engineer, water. One in [place] - Chief Strategy Officer          Social  Determinants of Radio broadcast assistant Strain: Low Risk    Difficulty of Paying Living Expenses: Not hard at all  Food Insecurity: No Food Insecurity   Worried About Charity fundraiser in the Last Year: Never true   Arboriculturist in the Last Year: Never true  Transportation Needs: No Transportation Needs   Lack of Transportation (Medical): No   Lack of Transportation (Non-Medical): No  Physical Activity: Sufficiently Active   Days of Exercise per Week: 7 days   Minutes of Exercise per Session: 60 min  Stress: No Stress Concern Present   Feeling of Stress : Not at all  Social Connections: Socially Integrated   Frequency of Communication with Friends and Family: Three times a week   Frequency of Social Gatherings with Friends and Family: Three times a week   Attends Religious Services: More than 4 times per year   Active Member of Clubs or Organizations: Yes   Attends Music therapist: More than 4 times per year   Marital Status: Married  Human resources officer Violence: Not At Risk   Fear of Current or Ex-Partner: No   Emotionally Abused: No   Physically Abused: No   Sexually Abused:  No    Past Surgical History:  Procedure Laterality Date   CARDIAC CATHETERIZATION  05/20/2013   CATARACT EXTRACTION W/ INTRAOCULAR LENS  IMPLANT, BILATERAL Bilateral 2008   CYSTECTOMY  ~ 2000   " SEBACEOUS cyst removed from between shoulder blades"   EXCISIONAL HEMORRHOIDECTOMY  1970's   INGUINAL HERNIA REPAIR Right ~ 2010   LEFT HEART CATHETERIZATION WITH CORONARY ANGIOGRAM N/A 05/20/2013   Procedure: LEFT HEART CATHETERIZATION WITH CORONARY ANGIOGRAM;  Surgeon: Peter M Martinique, MD;  Location: [facility] CATH LAB;  Service: Cardiovascular;  Laterality: N/A;   TONSILLECTOMY AND ADENOIDECTOMY  73's   VASECTOMY      Family History  Problem Relation Age of Onset   Colon cancer Mother        died at 48, colorectal   Diabetes Mother    Heart disease Father 96       MI   Diabetes Father     Stomach cancer Maternal Grandfather    Cancer Maternal Uncle     Allergies  Allergen Reactions   Metformin And Related Diarrhea    Current Outpatient Medications on File Prior to Visit  Medication Sig Dispense Refill   methylPREDNISolone (MEDROL DOSEPAK) 4 MG TBPK tablet Take as directed (Patient not taking: Reported on 09/08/2020) 21 tablet 0   ACCU-CHEK SOFTCLIX LANCETS lancets Use to test blood glucose once daily 100 each 3   allopurinol (ZYLOPRIM) 100 MG tablet Take 1 tablet (100 mg total) by mouth daily. 365 tablet 0   amLODipine (NORVASC) 10 MG tablet Take 1 tablet (10 mg total) by mouth daily. 90 tablet 3   aspirin EC 81 MG tablet Take 1 tablet (81 mg total) by mouth daily. 90 tablet 3   Blood Glucose Monitoring Suppl (ACCU-CHEK AVIVA PLUS) w/Device KIT Use to test blood glucose once daily 1 kit 0   Coenzyme Q10 300 MG CAPS Take 1 capsule by mouth daily.      glucose blood (ACCU-CHEK AVIVA) test strip Use to test blood glucose once daily 100 each 3   hydrocortisone (ANUSOL-HC) 25 MG suppository Place 25 mg rectally daily as needed for hemorrhoids or anal itching.     lisinopril (ZESTRIL) 10 MG tablet Take 1 tablet (10 mg total) by mouth daily. 90 tablet 3   pantoprazole (PROTONIX) 40 MG tablet Take 1 tablet (40 mg total) by mouth daily. 365 tablet 0   pravastatin (PRAVACHOL) 40 MG tablet Take 1 tablet (40 mg total) by mouth daily. 365 tablet 0   PREVIDENT 5000 ENAMEL PROTECT 1.1-5 % PSTE See admin instructions.     PROCTO-MED HC 2.5 % rectal cream APPLY 1 CREAM RECTALLY TWICE DAILY 90 g 3   No current facility-administered medications on file prior to visit.    BP 138/80   Pulse 68   Temp 97.7 F (36.5 C) (Oral)   Ht _0  (1.778 m)   Wt 178 lb (80.7 kg)   SpO2 99%   BMI 25.54 kg/m       Objective:   Physical Exam Vitals and nursing note reviewed.  Constitutional:      Appearance: Normal appearance.  Cardiovascular:     Rate and Rhythm: Normal rate and regular  rhythm.     Heart sounds: Normal heart sounds.  Musculoskeletal:        General: No swelling, tenderness or deformity. Normal range of motion.     Right lower leg: No edema.     Left lower leg: No edema.  Skin:  General: Skin is warm and dry.     Capillary Refill: Capillary refill takes less than 2 seconds.  Neurological:     General: No focal deficit present.     Mental Status: He is alert and oriented to person, place, and time.  Psychiatric:        Mood and Affect: Mood normal.        Behavior: Behavior normal.        Thought Content: Thought content normal.      Assessment & Plan:   1. Acute right-sided low back pain with right-sided sciatica - Advised stretching, warm compress and medrol dose pack. Follow up if not resolved in the next week.  - methylPREDNISolone (MEDROL DOSEPAK) 4 MG TBPK tablet; Take as directed  Dispense: 21 tablet; Refill: 0  [name], NP

## 2020-09-29 ENCOUNTER — Ambulatory Visit: Payer: Medicare HMO | Admitting: Adult Health

## 2020-10-01 ENCOUNTER — Encounter: Payer: Self-pay | Admitting: Adult Health

## 2020-10-05 NOTE — Telephone Encounter (Signed)
FYI

## 2020-11-03 ENCOUNTER — Encounter: Payer: Self-pay | Admitting: Adult Health

## 2020-11-04 NOTE — Telephone Encounter (Signed)
Pt spouse appt  has been changed to VV

## 2020-11-05 ENCOUNTER — Encounter: Payer: Self-pay | Admitting: Adult Health

## 2020-11-05 ENCOUNTER — Telehealth: Payer: Self-pay | Admitting: Pharmacist

## 2020-11-05 NOTE — Chronic Care Management (AMB) (Signed)
    Chronic Care Management Pharmacy Assistant   Name: Alexander [name]  MRN: [id number] DOB: 1940/08/10  11/05/20 APPOINTMENT REMINDER   Patient was reminded to bring all medications, supplements and any blood glucose and pressure readings  for review with Jeni Salles, Pharm. D, for office visit on 11/7 at 60. Patient was also reminded to bring blood pressure cuff, he reports he has 4 of them and does not use them his blood pressures are all normal at appointments so he does not check at home.   Care Gaps: Colonoscopy - Overdue COVID Vaccine #4(Pfizer) - Overdue Flu Vaccine - Overdue CCM F/U Call Scheduled- 11/22 AWV-  9/22 BP - 138/80  Star Rating Drug: Lisinopril (Zestril) 10 mg - Last filled 10/18/20 90 DS at [facility] Pravastatin (Pravachol) 40 mg - Last filled 03/15/20 90 DS at [facility]  Any gaps in medications fill history? Yes calls to [facility]/ and [employer] verified the above dates as accurate   Medications: Outpatient Encounter Medications as of 11/05/2020  Medication Sig   ACCU-CHEK SOFTCLIX LANCETS lancets Use to test blood glucose once daily   allopurinol (ZYLOPRIM) 100 MG tablet Take 1 tablet (100 mg total) by mouth daily.   amLODipine (NORVASC) 10 MG tablet Take 1 tablet (10 mg total) by mouth daily.   aspirin EC 81 MG tablet Take 1 tablet (81 mg total) by mouth daily.   Blood Glucose Monitoring Suppl (ACCU-CHEK AVIVA PLUS) w/Device KIT Use to test blood glucose once daily   Coenzyme Q10 300 MG CAPS Take 1 capsule by mouth daily.    glucose blood (ACCU-CHEK AVIVA) test strip Use to test blood glucose once daily   hydrocortisone (ANUSOL-HC) 25 MG suppository Place 25 mg rectally daily as needed for hemorrhoids or anal itching.   lisinopril (ZESTRIL) 10 MG tablet Take 1 tablet (10 mg total) by mouth daily.   methylPREDNISolone (MEDROL DOSEPAK) 4 MG TBPK tablet Take as directed   pantoprazole (PROTONIX) 40 MG tablet Take 1 tablet (40 mg total) by mouth daily.    pravastatin (PRAVACHOL) 40 MG tablet Take 1 tablet (40 mg total) by mouth daily.   PREVIDENT 5000 ENAMEL PROTECT 1.1-5 % PSTE See admin instructions.   PROCTO-MED HC 2.5 % rectal cream APPLY 1 CREAM RECTALLY TWICE DAILY   No facility-administered encounter medications on file as of 11/05/2020.    [place] Clinical Pharmacist Assistant [phone]

## 2020-11-05 NOTE — Telephone Encounter (Signed)
Please advise 

## 2020-11-08 ENCOUNTER — Telehealth: Payer: Self-pay

## 2020-11-08 ENCOUNTER — Telehealth: Payer: Medicare HMO

## 2020-11-08 ENCOUNTER — Ambulatory Visit: Payer: Medicare HMO

## 2020-11-08 NOTE — Telephone Encounter (Signed)
Caller states he is positive but no sx. His wife has it and he wants to make sure he cant take pills unless Tommi Rumps advises him to. ---Caller states he is positive, no symptoms. Sounds congested on the phone. Denies any symptoms, no fever.  11/06/2020 2:16:24 [place], RN, [place] Shutter Disagree/Comply Comply Caller Understands Yes  Care Advice Given Per Guideline * You should be able to treat this at home. HOME CARE: * From what you have told me, you have no symptoms. That is reassuring. * You will still need to isolate from others for 5 days after the date of the positive test. * People with MILD TO MODERATE SYMPTOMS AND WHO ARE AT HIGH RISK for severe COVID-19 may sometimes need special prescription medicines as outpatients. There are monoclonal antibodies (e.g., bamlanivimab, casirivimab-imdevimab, sotrovimab) and antiviral medicines (e.g., nirmatrelvir-ritonavir / Paxlovid, molnupiravir). CALL BACK IF: * You have more questions CARE ADVICE given per COVID-19 - DIAGNOSED OR SUSPECTED (Adult) guideline. * You had a viral test (e.g., nasal swab) for COVID-19 and it came back positive. The diagnosis has been confirmed. REASSURANCE AND EDUCATION - POSITIVE COVID-19 LAB TEST AND NO SYMPTOMS: * Here's some care advice to help you and to help prevent others from getting sick.

## 2020-11-08 NOTE — Progress Notes (Deleted)
Chronic Care Management Pharmacy Note  11/08/2020 Name:  Alexander [name] MRN:  [id number] DOB:  [birth year]  Summary: ***  Recommendations/Changes made from today's visit: ***  Plan: ***   Subjective: Alexander [name] is an 80 y.o. year old male who is a primary patient of [name], NP.  The CCM team was consulted for assistance with disease management and care coordination needs.    Engaged with patient face to face for follow up visit in response to provider referral for pharmacy case management and/or care coordination services.   Consent to Services:  The patient was given information about Chronic Care Management services, agreed to services, and gave verbal consent prior to initiation of services.  Please see initial visit note for detailed documentation.   Patient Care Team: [name], NP as PCP - General (Family Medicine) Jerline Pain, MD as PCP - Cardiology (Cardiology) Earnie Larsson, [facility] as Pharmacist (Pharmacist)  Recent office visits: 09/28/20 [name], NP: Patient presented for right-sided low back pain. Prescribed medrol dosepak.   09/08/20 Randel Pigg, LPN: Patient presented for AWV.  Recent consult visits: 09/08/20 Burney Gauze, MD (oncology): Patient presented for polycythemia vera follow up.   05/10/20 Burney Gauze, MD (oncology): Patient presented for polycythemia vera follow up.   Hospital visits: None in previous 6 months   Objective:  Lab Results  Component Value Date   CREATININE 1.24 09/08/2020   BUN 26 (H) 09/08/2020   GFR 56.73 (L) 03/12/2020   GFRNONAA 59 (L) 09/08/2020   GFRAA >60 07/25/2019   NA 139 09/08/2020   K 4.3 09/08/2020   CALCIUM 9.6 09/08/2020   CO2 28 09/08/2020   GLUCOSE 181 (H) 09/08/2020    Lab Results  Component Value Date/Time   HGBA1C 6.2 03/12/2020 11:10 AM   HGBA1C 6.3 09/27/2018 09:23 AM   GFR 56.73 (L) 03/12/2020 11:10 AM   GFR 54.76 (L) 09/27/2018 09:23 AM   MICROALBUR 5.14  (H) 07/21/2008 10:52 PM   MICROALBUR 5.0 (H) 06/11/2007 10:11 AM    Last diabetic Eye exam:  Lab Results  Component Value Date/Time   HMDIABEYEEXA No Retinopathy 09/17/2015 12:25 PM    Last diabetic Foot exam:  Lab Results  Component Value Date/Time   HMDIABFOOTEX yes 08/03/2008 12:00 AM     Lab Results  Component Value Date   CHOL 152 03/12/2020   HDL 46.00 03/12/2020   LDLCALC 88 03/12/2020   TRIG 89.0 03/12/2020   CHOLHDL 3 03/12/2020    Hepatic Function Latest Ref Rng & Units 09/08/2020 05/10/2020 03/12/2020  Total Protein 6.5 - 8.1 g/dL 7.2 6.9 7.0  Albumin 3.5 - 5.0 g/dL 4.2 4.1 4.2  AST 15 - 41 U/L 14(L) 15 14  ALT 0 - 44 U/L _0 Alk Phosphatase 38 - 126 U/L 74 71 77  Total Bilirubin 0.3 - 1.2 mg/dL 0.5 0.5 1.2  Bilirubin, Direct 0.0 - 0.3 mg/dL - - -    Lab Results  Component Value Date/Time   TSH 0.75 03/12/2020 11:10 AM   TSH 0.88 09/27/2018 09:23 AM    CBC Latest Ref Rng & Units 09/08/2020 05/10/2020 03/12/2020  WBC 4.0 - 10.5 K/uL 9.1 11.5(H) 8.3  Hemoglobin 13.0 - 17.0 g/dL 14.3 15.2 14.5  Hematocrit 39.0 - 52.0 % 43.2 45.2 43.3  Platelets 150 - 400 K/uL 280 237 241.0    No results found for: VD25OH  Clinical ASCVD: Yes  The ASCVD Risk score (Arnett DK, et al., 2019) failed  to calculate for the following reasons:   The 2019 ASCVD risk score is only valid for ages 35 to 20    Depression screen PHQ 2/9 09/08/2020 03/12/2020 09/26/2019  Decreased Interest - 0 0  Down, Depressed, Hopeless 0 0 0  PHQ - 2 Score 0 0 0  Altered sleeping - - 0  Tired, decreased energy - - 0  Change in appetite - - 0  Feeling bad or failure about yourself  - - 0  Trouble concentrating - - 0  Moving slowly or fidgety/restless - - 0  Suicidal thoughts - - 0  PHQ-9 Score - - 0  Difficult doing work/chores - - Not difficult at all  Some recent data might be hidden     ***Other: (CHADS2VASc if Afib, MMRC or CAT for COPD, ACT, DEXA)  Social History   Tobacco Use  Smoking  Status Never  Smokeless Tobacco Never  Tobacco Comments   NEVER USED TOBACCO   BP Readings from Last 3 Encounters:  09/28/20 138/80  09/08/20 133/66  09/08/20 122/72   Pulse Readings from Last 3 Encounters:  09/28/20 68  09/08/20 (!) 50  09/08/20 60   Wt Readings from Last 3 Encounters:  09/28/20 178 lb (80.7 kg)  09/08/20 176 lb (79.8 kg)  09/08/20 173 lb (78.5 kg)   BMI Readings from Last 3 Encounters:  09/28/20 25.54 kg/m  09/08/20 25.25 kg/m  09/08/20 24.82 kg/m    Assessment/Interventions: Review of patient past medical history, allergies, medications, health status, including review of consultants reports, laboratory and other test data, was performed as part of comprehensive evaluation and provision of chronic care management services.   SDOH:  (Social Determinants of Health) assessments and interventions performed: {yes/no:20286}  SDOH Screenings   Alcohol Screen: Low Risk    Last Alcohol Screening Score (AUDIT): 0  Depression (PHQ2-9): Low Risk    PHQ-2 Score: 0  Financial Resource Strain: Low Risk    Difficulty of Paying Living Expenses: Not hard at all  Food Insecurity: No Food Insecurity   Worried About Charity fundraiser in the Last Year: Never true   Ran Out of Food in the Last Year: Never true  Housing: Low Risk    Last Housing Risk Score: 0  Physical Activity: Sufficiently Active   Days of Exercise per Week: 7 days   Minutes of Exercise per Session: 60 min  Social Connections: Socially Integrated   Frequency of Communication with Friends and Family: Three times a week   Frequency of Social Gatherings with Friends and Family: Three times a week   Attends Religious Services: More than 4 times per year   Active Member of Clubs or Organizations: Yes   Attends Music therapist: More than 4 times per year   Marital Status: Married  Stress: No Stress Concern Present   Feeling of Stress : Not at all  Tobacco Use: Low Risk    Smoking  Tobacco Use: Never   Smokeless Tobacco Use: Never   Passive Exposure: Not on file  Transportation Needs: No Transportation Needs   Lack of Transportation (Medical): No   Lack of Transportation (Non-Medical): No    CCM Care Plan  Allergies  Allergen Reactions   Metformin And Related Diarrhea    Medications Reviewed Today     Reviewed by Franco Collet, CMA (Certified Medical Assistant) on 09/28/20 at 52  Med List Status: <None>   Medication Order Taking? Sig Documenting Provider Last Dose Status Informant  ACCU-CHEK SOFTCLIX LANCETS lancets [id number] No Use to test blood glucose once daily Nafziger, Tommi Rumps, NP Taking Active   allopurinol (ZYLOPRIM) 100 MG tablet [id number] No Take 1 tablet (100 mg total) by mouth daily. Nafziger, Tommi Rumps, NP Taking Active   amLODipine (NORVASC) 10 MG tablet [id number] No Take 1 tablet (10 mg total) by mouth daily. Nafziger, Tommi Rumps, NP Taking Active   aspirin EC 81 MG tablet [id number] No Take 1 tablet (81 mg total) by mouth daily. Jerline Pain, MD Taking Active   Blood Glucose Monitoring Suppl (ACCU-CHEK AVIVA PLUS) w/Device KIT [id number] No Use to test blood glucose once daily Nafziger, Tommi Rumps, NP Taking Active   Coenzyme Q10 300 MG CAPS [id number] No Take 1 capsule by mouth daily.  [provider] Taking Active Self  glucose blood (ACCU-CHEK AVIVA) test strip [id number] No Use to test blood glucose once daily Nafziger, Tommi Rumps, NP Taking Active   hydrocortisone (ANUSOL-HC) 25 MG suppository [id number] No Place 25 mg rectally daily as needed for hemorrhoids or anal itching. [provider] Taking Active   lisinopril (ZESTRIL) 10 MG tablet [id number] No Take 1 tablet (10 mg total) by mouth daily. Nafziger, Tommi Rumps, NP Taking Active   methylPREDNISolone (MEDROL DOSEPAK) 4 MG TBPK tablet [id number] No Take as directed  Patient not taking: Reported on 09/08/2020   [name], NP Not Taking Active   pantoprazole (PROTONIX) 40 MG tablet [id number] No Take  1 tablet (40 mg total) by mouth daily. Nafziger, Tommi Rumps, NP Taking Active   pravastatin (PRAVACHOL) 40 MG tablet [id number] No Take 1 tablet (40 mg total) by mouth daily. [name], NP Taking Active   PREVIDENT 5000 ENAMEL PROTECT 1.1-5 % PSTE [id number] No See admin instructions. [provider] Taking Active   PROCTO-MED HC 2.5 % rectal cream [id number] No APPLY 1 CREAM RECTALLY TWICE DAILY [name], NP Taking Active             Patient Active Problem List   Diagnosis Date Noted   TMJ syndrome 03/05/2017   Hemorrhoids 01/21/2014   CAD (coronary artery disease) 05/22/2013   Angina at rest [facility]) 05/20/2013   Unstable angina ([place]) 05/20/2013   Allergic rhinitis 04/29/2010   GERD 10/05/2008   MICROALBUMINURIA 07/22/2008   BACK PAIN, LUMBAR 06/26/2008   MICROSCOPIC HEMATURIA 03/20/2007   Prediabetes 12/13/2006   Polycythemia vera ([place]) 11/26/2006   Hyperlipidemia 11/26/2006   Gout 11/26/2006   Essential hypertension 11/26/2006   DIVERTICULOSIS, COLON 11/26/2006   INGUINAL PAIN, RIGHT 11/26/2006   COLONIC POLYPS, HX OF 11/26/2006   NEPHROLITHIASIS, HX OF 11/26/2006    Immunization History  Administered Date(s) Administered   Fluad Quad(high Dose 65+) 08/30/2018, 11/19/2019   Influenza Split 09/14/2011   Influenza Whole 10/04/2006, 09/26/2007, 09/16/2008   Influenza, High Dose Seasonal PF 09/20/2013, 09/25/2014, 10/11/2015, 08/31/2016, 09/06/2017   Influenza,inj,Quad PF,6+ Mos 09/11/2012, 09/17/2020   PFIZER(Purple Top)SARS-COV-2 Vaccination 02/09/2019, 03/06/2019, 10/06/2019   Pneumococcal Conjugate-13 10/03/2012, 07/18/2013, 04/22/2014   Pneumococcal Polysaccharide-23 12/18/2006   Tdap 01/17/2012   Zoster Recombinat (Shingrix) 05/29/2017, 11/09/2017   Zoster, Live 09/22/2010    Conditions to be addressed/monitored:  Hypertension, Hyperlipidemia, Coronary Artery Disease, GERD, Gout, and Prediabetes, polycythemia vera  Conditions addressed this  visit: ***  There are no care plans that you recently modified to display for this patient.   Current Barriers:  {pharmacybarriers:24917}  Pharmacist Clinical Goal(s):  Patient will {PHARMACYGOALCHOICES:24921} through collaboration with PharmD and provider.   Interventions: 1:1 collaboration with [name], NP regarding development and update  of comprehensive plan of care as evidenced by provider attestation and co-signature Inter-disciplinary care team collaboration (see longitudinal plan of care) Comprehensive medication review performed; medication list updated in electronic medical record  BP Readings from Last 3 Encounters:  09/28/20 138/80  09/08/20 133/66  09/08/20 122/72     Hypertension (BP goal <140/90) -Controlled -Current treatment: Amlodipine 74m, 1 tablet once daily Lisinopril 166m 1 tablet once daily  -Medications previously tried: olmesartan, losartan, carvedilol, metoprolol, valsartan   -Current home readings: *** -Current dietary habits: *** -Current exercise habits: *** -{ACTIONS;DENIES/REPORTS:21021675::"Denies"} hypotensive/hypertensive symptoms -Educated on {CCM BP Counseling:25124} -Counseled to monitor BP at home ***, document, and provide log at future appointments -{CCMPHARMDINTERVENTION:25122}  Lab Results  Component Value Date   CHOL 152 03/12/2020   HDL 46.00 03/12/2020   LDLCALC 88 03/12/2020   TRIG 89.0 03/12/2020   CHOLHDL 3 03/12/2020    Hyperlipidemia: (LDL goal < 70) -Uncontrolled -Current treatment: Pravastatin 4071m1 tablet once daily -Medications previously tried: ***  -Current dietary patterns: *** -Current exercise habits: *** -Educated on {CCM HLD Counseling:25126} -{CCMPHARMDINTERVENTION:25122} -increase statin?  Lab Results  Component Value Date   HGBA1C 6.2 03/12/2020    Pre-diabetes (A1c goal <6.5%) -{US controlled/uncontrolled:25276} -Current medications: No medications -Medications previously  tried: metformin (diarrhea)   -Current home glucose readings fasting glucose: *** post prandial glucose: *** -{ACTIONS;DENIES/REPORTS:21021675::"Denies"} hypoglycemic/hyperglycemic symptoms -Current meal patterns:  breakfast: ***  lunch: ***  dinner: *** snacks: *** drinks: *** -Current exercise: *** -Educated on {CCM DM COUNSELING:25123} -Counseled to check feet daily and get yearly eye exams -{CCMPHARMDINTERVENTION:25122}  CAD (Goal: ***) -{US controlled/uncontrolled:25276} -Current treatment  Aspirin 38m54m tablet once daily  Pravastatin 40mg97mtablet once daily -Medications previously tried: ***  -{CCMPHARMDINTERVENTION:25122}  Polycythemia vera (Goal: ***) -{US controlled/uncontrolled:25276} -Current treatment  Aspirin 38mg,57mablet once daily -Medications previously tried: ***  -{CCMPHARMDINTERVENTION:25122} Avoidance of NSAIDs?  GERD (Goal: ***) -{US controlled/uncontrolled:25276} -Current treatment  Pantoprazole 40mg,159mlet once daily  -Medications previously tried: esomeprazole  -{CCMPHARMDINTERVENTION:25122} -symptoms? Taper?  No results found for: LABURIC  Gout (Goal: ***) -{US controlled/uncontrolled:25276} -Current treatment  Allopurinol 100mg, 123mlet once daily  -Medications previously tried: ***  -{CCMPHARMDINTERVENTION:25122} -needs uric acid level   Health Maintenance -Vaccine gaps: COVID booster -Current therapy:  Coenzyme Q10 300mg, 1 17mule once daily  Procto-Med HC 2.5% rectal cream, apply cream rectally twice daily PRN  Hydrocortisone (Anusol- HC) 25mg supp72mory, use 1 suppository rectally as needed  -Educated on {ccm supplement counseling:25128} -{CCM Patient satisfied:25129} -{CCMPHARMDINTERVENTION:25122}  Patient Goals/Self-Care Activities Patient will:  - {pharmacypatientgoals:24919}  Follow Up Plan: {CM FOLLOW UP PLAN:22241HUTM:54650}ion Assistance: {MEDASSISTANCEINFO:25044}  Compliance/Adherence/Medication  fill history: Care Gaps: ***  Star-Rating Drugs: ***  Patient's preferred pharmacy is:  [id number] [id number] [id number] box? {Yes or If no, why not?:20788} Pt endorses ***% compliance  We discussed: {Pharmacy options:24294} [facility] Patient Decision:  {FOLLOWUP:24991}  Plan: {CM FOLLOW UP PLAN:25073ZLDJ:57017} PJeni SallesBCACP ClinCataract And Laser InstitutePharmacist {MP practice sites:26434} [phone]

## 2020-11-09 ENCOUNTER — Encounter: Payer: Self-pay | Admitting: Adult Health

## 2020-11-09 ENCOUNTER — Telehealth: Payer: Self-pay | Admitting: Pharmacist

## 2020-11-09 NOTE — Chronic Care Management (AMB) (Signed)
Chronic Care Management Pharmacy Assistant   Name: DAMEAN POFFENBERGER  MRN: [id number] DOB: 1940-12-26  Reason for Encounter: Reschedule missed appointment   Recent office visits:  None  Recent consult visits:  None  Hospital visits:  None in previous 6 months  Medications: Outpatient Encounter Medications as of 11/09/2020  Medication Sig   ACCU-CHEK SOFTCLIX LANCETS lancets Use to test blood glucose once daily   allopurinol (ZYLOPRIM) 100 MG tablet Take 1 tablet (100 mg total) by mouth daily.   amLODipine (NORVASC) 10 MG tablet Take 1 tablet (10 mg total) by mouth daily.   aspirin EC 81 MG tablet Take 1 tablet (81 mg total) by mouth daily.   Blood Glucose Monitoring Suppl (ACCU-CHEK AVIVA PLUS) w/Device KIT Use to test blood glucose once daily   Coenzyme Q10 300 MG CAPS Take 1 capsule by mouth daily.    glucose blood (ACCU-CHEK AVIVA) test strip Use to test blood glucose once daily   hydrocortisone (ANUSOL-HC) 25 MG suppository Place 25 mg rectally daily as needed for hemorrhoids or anal itching.   lisinopril (ZESTRIL) 10 MG tablet Take 1 tablet (10 mg total) by mouth daily.   methylPREDNISolone (MEDROL DOSEPAK) 4 MG TBPK tablet Take as directed   pantoprazole (PROTONIX) 40 MG tablet Take 1 tablet (40 mg total) by mouth daily.   pravastatin (PRAVACHOL) 40 MG tablet Take 1 tablet (40 mg total) by mouth daily.   PREVIDENT 5000 ENAMEL PROTECT 1.1-5 % PSTE See admin instructions.   PROCTO-MED HC 2.5 % rectal cream APPLY 1 CREAM RECTALLY TWICE DAILY   No facility-administered encounter medications on file as of 11/09/2020.  Call to patient he reports he is feeling under the weather but much better today than prior. He reports he has had COVID before. Offered to reschedule missed appointment with Jeni Salles patient asked that I give him a call this afternoon so he can get his calender book to confirm. Advised id try him back this afternoon.  11/10/20 Call back to patient he  advised the above again stated he would call me this afternoon when he has the calender.  Care Gaps: COVID Vaccine #4(Pfizer) - Overdue Flu Vaccine - Overdue CCM F/U AWV-  9/22 BP - 138/80  Star Rating Drugs: Lisinopril (Zestril) 10 mg - Last filled 10/18/20 90 DS at [facility] Pravastatin (Pravachol) 40 mg - Last filled 03/15/20 90 DS at [facility] calls to [facility]/ and [employer] verified the above dates as accurate  [place] Pharmacist Assistant [phone]

## 2020-11-29 NOTE — Progress Notes (Signed)
Call to patient to offer to reschedule missed appointment with Jeni Salles in office or via phone, unable to reach left voicemail with my contact information for return call.    [place] Clinical Pharmacist Assistant [phone]

## 2020-12-08 ENCOUNTER — Other Ambulatory Visit: Payer: Self-pay

## 2020-12-08 ENCOUNTER — Inpatient Hospital Stay: Payer: Medicare HMO | Attending: Hematology & Oncology

## 2020-12-08 ENCOUNTER — Inpatient Hospital Stay (HOSPITAL_BASED_OUTPATIENT_CLINIC_OR_DEPARTMENT_OTHER): Payer: Medicare HMO | Admitting: Hematology & Oncology

## 2020-12-08 ENCOUNTER — Encounter: Payer: Self-pay | Admitting: Hematology & Oncology

## 2020-12-08 VITALS — BP 114/64 | HR 58 | Temp 97.9°F | Resp 18 | Wt 180.0 lb

## 2020-12-08 DIAGNOSIS — D751 Secondary polycythemia: Secondary | ICD-10-CM | POA: Diagnosis not present

## 2020-12-08 DIAGNOSIS — Z79899 Other long term (current) drug therapy: Secondary | ICD-10-CM | POA: Insufficient documentation

## 2020-12-08 DIAGNOSIS — Z8639 Personal history of other endocrine, nutritional and metabolic disease: Secondary | ICD-10-CM | POA: Insufficient documentation

## 2020-12-08 DIAGNOSIS — D45 Polycythemia vera: Secondary | ICD-10-CM

## 2020-12-08 DIAGNOSIS — Z7982 Long term (current) use of aspirin: Secondary | ICD-10-CM | POA: Diagnosis not present

## 2020-12-08 LAB — RETICULOCYTES
Immature Retic Fract: 7.7 % (ref 2.3–15.9)
RBC.: 4.66 MIL/uL (ref 4.22–5.81)
Retic Count, Absolute: 57.3 10*3/uL (ref 19.0–186.0)
Retic Ct Pct: 1.2 % (ref 0.4–3.1)

## 2020-12-08 LAB — CBC WITH DIFFERENTIAL (CANCER CENTER ONLY)
Abs Immature Granulocytes: 0.16 10*3/uL — ABNORMAL HIGH (ref 0.00–0.07)
Basophils Absolute: 0.1 10*3/uL (ref 0.0–0.1)
Basophils Relative: 1 %
Eosinophils Absolute: 0.4 10*3/uL (ref 0.0–0.5)
Eosinophils Relative: 4 %
HCT: 44.1 % (ref 39.0–52.0)
Hemoglobin: 14.4 g/dL (ref 13.0–17.0)
Immature Granulocytes: 2 %
Lymphocytes Relative: 23 %
Lymphs Abs: 2.2 10*3/uL (ref 0.7–4.0)
MCH: 30.9 pg (ref 26.0–34.0)
MCHC: 32.7 g/dL (ref 30.0–36.0)
MCV: 94.6 fL (ref 80.0–100.0)
Monocytes Absolute: 1.3 10*3/uL — ABNORMAL HIGH (ref 0.1–1.0)
Monocytes Relative: 13 %
Neutro Abs: 5.5 10*3/uL (ref 1.7–7.7)
Neutrophils Relative %: 57 %
Platelet Count: 224 10*3/uL (ref 150–400)
RBC: 4.66 MIL/uL (ref 4.22–5.81)
RDW: 15 % (ref 11.5–15.5)
WBC Count: 9.6 10*3/uL (ref 4.0–10.5)
nRBC: 0 % (ref 0.0–0.2)

## 2020-12-08 LAB — CMP (CANCER CENTER ONLY)
ALT: 19 U/L (ref 0–44)
AST: 16 U/L (ref 15–41)
Albumin: 4.2 g/dL (ref 3.5–5.0)
Alkaline Phosphatase: 78 U/L (ref 38–126)
Anion gap: 7 (ref 5–15)
BUN: 25 mg/dL — ABNORMAL HIGH (ref 8–23)
CO2: 29 mmol/L (ref 22–32)
Calcium: 9.8 mg/dL (ref 8.9–10.3)
Chloride: 104 mmol/L (ref 98–111)
Creatinine: 1.27 mg/dL — ABNORMAL HIGH (ref 0.61–1.24)
GFR, Estimated: 57 mL/min — ABNORMAL LOW (ref >60)
Glucose, Bld: 142 mg/dL — ABNORMAL HIGH (ref 70–99)
Potassium: 4.6 mmol/L (ref 3.5–5.1)
Sodium: 140 mmol/L (ref 135–145)
Total Bilirubin: 0.5 mg/dL (ref 0.3–1.2)
Total Protein: 6.8 g/dL (ref 6.5–8.1)

## 2020-12-08 LAB — LACTATE DEHYDROGENASE: LDH: 125 U/L (ref 98–192)

## 2020-12-08 NOTE — Progress Notes (Signed)
Hematology and Oncology Follow Up Visit  Alexander [name] [id number] [birth year] 80 y.o. 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 is in for a followup.  So far, he is doing pretty well.  He had COVID again.  This was a few weeks ago.  He got it from his wife.  Thankfully, he has had all of his vaccines and boosters.  He had a little bit of a "cold".  Thankfully, he really got through this okay.  His wife is doing better.  He feels okay.  He is trying to stay active.  He has had no problems with respect to headache.  He has had no cardiac issues.  I think he sees cardiologist soon.  He sees the cardiologist yearly.  His last iron studies back in September showed a ferritin of 71 with iron saturation of 27%.  He has had no problems with fever.  He has had no rashes.  He has had no change in bowel or bladder habits.  Overall, I would say his performance status is [name].    [name] is given follow-up in 2 months Medications:  Current Outpatient Medications:    ACCU-CHEK SOFTCLIX LANCETS lancets, Use to test blood glucose once daily, Disp: 100 each, Rfl: 3   allopurinol (ZYLOPRIM) 100 MG tablet, Take 1 tablet (100 mg total) by mouth daily., Disp: 365 tablet, Rfl: 0   amLODipine (NORVASC) 10 MG tablet, Take 1 tablet (10 mg total) by mouth daily., Disp: 90 tablet, Rfl: 3   aspirin EC 81 MG tablet, Take 1 tablet (81 mg total) by mouth daily., Disp: 90 tablet, Rfl: 3   Blood Glucose Monitoring Suppl (ACCU-CHEK AVIVA PLUS) w/Device KIT, Use to test blood glucose once daily, Disp: 1 kit, Rfl: 0   Coenzyme Q10 300 MG CAPS, Take 1 capsule by mouth daily. , Disp: , Rfl:    glucose blood (ACCU-CHEK AVIVA) test strip, Use to test blood glucose once daily, Disp: 100 each, Rfl: 3   hydrocortisone  (ANUSOL-HC) 25 MG suppository, Place 25 mg rectally daily as needed for hemorrhoids or anal itching., Disp: , Rfl:    lisinopril (ZESTRIL) 10 MG tablet, Take 1 tablet (10 mg total) by mouth daily., Disp: 90 tablet, Rfl: 3   methylPREDNISolone (MEDROL DOSEPAK) 4 MG TBPK tablet, Take as directed, Disp: 21 tablet, Rfl: 0   pantoprazole (PROTONIX) 40 MG tablet, Take 1 tablet (40 mg total) by mouth daily., Disp: 365 tablet, Rfl: 0   pravastatin (PRAVACHOL) 40 MG tablet, Take 1 tablet (40 mg total) by mouth daily., Disp: 365 tablet, Rfl: 0   PREVIDENT 5000 ENAMEL PROTECT 1.1-5 % PSTE, See admin instructions., Disp: , Rfl:    PROCTO-MED HC 2.5 % rectal cream, APPLY 1 CREAM RECTALLY TWICE DAILY, Disp: 90 g, Rfl: 3  Allergies:  Allergies  Allergen Reactions   Metformin And Related Diarrhea    Past Medical History, Surgical history, Social history, and Family History were reviewed and updated.  Review of Systems: Review of Systems  Constitutional: Negative.   HENT: Negative.    Eyes: Negative.   Respiratory: Negative.    Cardiovascular: Negative.   Gastrointestinal: Negative.   Genitourinary: Negative.   Musculoskeletal: Negative.   Skin: Negative.   Neurological: Negative.   Endo/Heme/Allergies: Negative.   Psychiatric/Behavioral: Negative.  Physical Exam:  weight is 180 lb (81.6 kg). His oral temperature is 97.9 F (36.6 C). His blood pressure is 114/64 and his pulse is 58 (abnormal). His respiration is 18 and oxygen saturation is 100%.   Physical Exam Vitals reviewed.  HENT:     Head: Normocephalic and atraumatic.  Eyes:     Pupils: Pupils are equal, round, and reactive to light.  Cardiovascular:     Rate and Rhythm: Normal rate and regular rhythm.     Heart sounds: Normal heart sounds.  Pulmonary:     Effort: Pulmonary effort is normal.     Breath sounds: Normal breath sounds.  Abdominal:     General: Bowel sounds are normal.     Palpations: Abdomen is soft.   Musculoskeletal:        General: No tenderness or deformity. Normal range of motion.     Cervical back: Normal range of motion.  Lymphadenopathy:     Cervical: No cervical adenopathy.  Skin:    General: Skin is warm and dry.     Findings: No erythema or rash.  Neurological:     Mental Status: He is alert and oriented to person, place, and time.  Psychiatric:        Behavior: Behavior normal.        Thought Content: Thought content normal.        Judgment: Judgment normal.     Lab Results  Component Value Date   WBC 9.6 12/08/2020   HGB 14.4 12/08/2020   HCT 44.1 12/08/2020   MCV 94.6 12/08/2020   PLT 224 12/08/2020     Chemistry      Component Value Date/Time   NA 140 12/08/2020 1311   NA 144 11/29/2016 1054   NA 139 12/29/2015 0954   K 4.6 12/08/2020 1311   K 4.2 11/29/2016 1054   K 4.1 12/29/2015 0954   CL 104 12/08/2020 1311   CL 102 11/29/2016 1054   CO2 29 12/08/2020 1311   CO2 30 11/29/2016 1054   CO2 26 12/29/2015 0954   BUN 25 (H) 12/08/2020 1311   BUN 22 11/29/2016 1054   BUN 20.0 12/29/2015 0954   CREATININE 1.27 (H) 12/08/2020 1311   CREATININE 1.2 11/29/2016 1054   CREATININE 1.3 12/29/2015 0954      Component Value Date/Time   CALCIUM 9.8 12/08/2020 1311   CALCIUM 9.3 11/29/2016 1054   CALCIUM 9.5 12/29/2015 0954   ALKPHOS 78 12/08/2020 1311   ALKPHOS 92 (H) 11/29/2016 1054   ALKPHOS 98 12/29/2015 0954   AST 16 12/08/2020 1311   AST 16 12/29/2015 0954   ALT 19 12/08/2020 1311   ALT 20 11/29/2016 1054   ALT 13 12/29/2015 0954   BILITOT 0.5 12/08/2020 1311   BILITOT 0.71 12/29/2015 0954      Impression and Plan: Alexander [name] is 80 year old gentleman. He has secondary polycythemia/erythrocytosis. He is JAK2 negative.   He does not need to be phlebotomized right now.  His hemoglobin is rising slowly.  However, I think we can still hold off on phlebotomize again.  We will now get him through the Winter.  I will see him back in March.       [name], MD 12/7/20222:11 PM

## 2020-12-09 LAB — FERRITIN: Ferritin: 34 ng/mL (ref 24–336)

## 2020-12-09 LAB — IRON AND TIBC
Iron: 79 ug/dL (ref 42–163)
Saturation Ratios: 23 % (ref 20–55)
TIBC: 341 ug/dL (ref 202–409)
UIBC: 263 ug/dL (ref 117–376)

## 2020-12-15 ENCOUNTER — Ambulatory Visit: Payer: Medicare HMO

## 2021-01-10 ENCOUNTER — Encounter: Payer: Self-pay | Admitting: Adult Health

## 2021-01-11 MED ORDER — PROCTO-MED HC 2.5 % EX CREA: TOPICAL_CREAM | CUTANEOUS | 3 refills | Status: DC

## 2021-01-28 ENCOUNTER — Encounter: Payer: Self-pay | Admitting: Adult Health

## 2021-02-01 NOTE — Telephone Encounter (Signed)
Pt wants to know if he should get the other Covid vaccine. Pt and spouse are unaware which vaccine was missing. Pt stated that they have 4 is the 5th one really needed? Please advise

## 2021-02-19 ENCOUNTER — Encounter: Payer: Self-pay | Admitting: Adult Health

## 2021-02-21 NOTE — Telephone Encounter (Signed)
Pt advised to bring card in at updated visit.

## 2021-03-03 ENCOUNTER — Other Ambulatory Visit: Payer: Self-pay | Admitting: Adult Health

## 2021-03-03 DIAGNOSIS — K219 Gastro-esophageal reflux disease without esophagitis: Secondary | ICD-10-CM

## 2021-03-03 DIAGNOSIS — E785 Hyperlipidemia, unspecified: Secondary | ICD-10-CM

## 2021-03-03 DIAGNOSIS — M1000 Idiopathic gout, unspecified site: Secondary | ICD-10-CM

## 2021-03-03 DIAGNOSIS — I10 Essential (primary) hypertension: Secondary | ICD-10-CM

## 2021-03-08 ENCOUNTER — Ambulatory Visit: Payer: Medicare HMO | Admitting: Hematology & Oncology

## 2021-03-08 ENCOUNTER — Inpatient Hospital Stay (HOSPITAL_BASED_OUTPATIENT_CLINIC_OR_DEPARTMENT_OTHER): Payer: Medicare HMO | Admitting: Hematology & Oncology

## 2021-03-08 ENCOUNTER — Encounter: Payer: Self-pay | Admitting: Hematology & Oncology

## 2021-03-08 ENCOUNTER — Other Ambulatory Visit: Payer: Self-pay | Admitting: Hematology & Oncology

## 2021-03-08 ENCOUNTER — Inpatient Hospital Stay: Payer: Medicare HMO | Attending: Hematology & Oncology

## 2021-03-08 ENCOUNTER — Other Ambulatory Visit: Payer: Self-pay

## 2021-03-08 VITALS — BP 126/68 | HR 56 | Temp 98.2°F | Resp 18 | Wt 181.0 lb

## 2021-03-08 DIAGNOSIS — D751 Secondary polycythemia: Secondary | ICD-10-CM | POA: Diagnosis present

## 2021-03-08 DIAGNOSIS — D45 Polycythemia vera: Secondary | ICD-10-CM

## 2021-03-08 LAB — CBC WITH DIFFERENTIAL (CANCER CENTER ONLY)
Abs Immature Granulocytes: 0.05 10*3/uL (ref 0.00–0.07)
Basophils Absolute: 0.1 10*3/uL (ref 0.0–0.1)
Basophils Relative: 1 %
Eosinophils Absolute: 0.4 10*3/uL (ref 0.0–0.5)
Eosinophils Relative: 4 %
HCT: 46.8 % (ref 39.0–52.0)
Hemoglobin: 15.7 g/dL (ref 13.0–17.0)
Immature Granulocytes: 1 %
Lymphocytes Relative: 24 %
Lymphs Abs: 2.4 10*3/uL (ref 0.7–4.0)
MCH: 31.2 pg (ref 26.0–34.0)
MCHC: 33.5 g/dL (ref 30.0–36.0)
MCV: 92.9 fL (ref 80.0–100.0)
Monocytes Absolute: 1.3 10*3/uL — ABNORMAL HIGH (ref 0.1–1.0)
Monocytes Relative: 13 %
Neutro Abs: 5.6 10*3/uL (ref 1.7–7.7)
Neutrophils Relative %: 57 %
Platelet Count: 226 10*3/uL (ref 150–400)
RBC: 5.04 MIL/uL (ref 4.22–5.81)
RDW: 14.5 % (ref 11.5–15.5)
WBC Count: 9.7 10*3/uL (ref 4.0–10.5)
nRBC: 0 % (ref 0.0–0.2)

## 2021-03-08 LAB — CMP (CANCER CENTER ONLY)
ALT: 14 U/L (ref 0–44)
AST: 14 U/L — ABNORMAL LOW (ref 15–41)
Albumin: 4.1 g/dL (ref 3.5–5.0)
Alkaline Phosphatase: 81 U/L (ref 38–126)
Anion gap: 7 (ref 5–15)
BUN: 20 mg/dL (ref 8–23)
CO2: 30 mmol/L (ref 22–32)
Calcium: 9.5 mg/dL (ref 8.9–10.3)
Chloride: 102 mmol/L (ref 98–111)
Creatinine: 1.22 mg/dL (ref 0.61–1.24)
GFR, Estimated: 60 mL/min — ABNORMAL LOW (ref >60)
Glucose, Bld: 119 mg/dL — ABNORMAL HIGH (ref 70–99)
Potassium: 4.3 mmol/L (ref 3.5–5.1)
Sodium: 139 mmol/L (ref 135–145)
Total Bilirubin: 0.7 mg/dL (ref 0.3–1.2)
Total Protein: 7.1 g/dL (ref 6.5–8.1)

## 2021-03-08 LAB — RETICULOCYTES
Immature Retic Fract: 2.7 % (ref 2.3–15.9)
RBC.: 5.03 MIL/uL (ref 4.22–5.81)
Retic Count, Absolute: 58.3 10*3/uL (ref 19.0–186.0)
Retic Ct Pct: 1.2 % (ref 0.4–3.1)

## 2021-03-08 LAB — IRON AND IRON BINDING CAPACITY (CC-WL,HP ONLY)
Iron: 136 ug/dL (ref 45–182)
Saturation Ratios: 38 % (ref 17.9–39.5)
TIBC: 358 ug/dL (ref 250–450)
UIBC: 222 ug/dL (ref 117–376)

## 2021-03-08 NOTE — Progress Notes (Unsigned)
Hematology and Oncology Follow Up Visit  Alexander [name] [id number] 11/03/1940 81 y.o. 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  Alexander [name] is in for a followup.  So far, he is doing pretty well.  He had COVID again.  This was a few weeks ago.  He got it from his wife.  Thankfully, he has had all of his vaccines and boosters.  He had a little bit of a "cold".  Thankfully, he really got through this okay.  His wife is doing better.  He feels okay.  He is trying to stay active.  He has had no problems with respect to headache.  He has had no cardiac issues.  I think he sees cardiologist soon.  He sees the cardiologist yearly.  His last iron studies back in September showed a ferritin of 71 with iron saturation of 27%.  He has had no problems with fever.  He has had no rashes.  He has had no change in bowel or bladder habits.  Overall, I would say his performance status is [name].    [name] is given follow-up in 2 months Medications:  Current Outpatient Medications:    ACCU-CHEK SOFTCLIX LANCETS lancets, Use to test blood glucose once daily, Disp: 100 each, Rfl: 3   allopurinol (ZYLOPRIM) 100 MG tablet, TAKE ONE TABLET BY MOUTH DAILY, Disp: 365 tablet, Rfl: 0   amLODipine (NORVASC) 10 MG tablet, TAKE ONE TABLET BY MOUTH DAILY, Disp: 90 tablet, Rfl: 3   aspirin EC 81 MG tablet, Take 1 tablet (81 mg total) by mouth daily., Disp: 90 tablet, Rfl: 3   Blood Glucose Monitoring Suppl (ACCU-CHEK AVIVA PLUS) w/Device KIT, Use to test blood glucose once daily, Disp: 1 kit, Rfl: 0   Coenzyme Q10 300 MG CAPS, Take 1 capsule by mouth daily. , Disp: , Rfl:    glucose blood (ACCU-CHEK AVIVA) test strip, Use to test blood glucose once daily, Disp: 100 each, Rfl: 3   hydrocortisone (ANUSOL-HC) 25 MG suppository, Place  25 mg rectally daily as needed for hemorrhoids or anal itching., Disp: , Rfl:    lisinopril (ZESTRIL) 10 MG tablet, TAKE ONE TABLET BY MOUTH DAILY, Disp: 90 tablet, Rfl: 3   methylPREDNISolone (MEDROL DOSEPAK) 4 MG TBPK tablet, Take as directed, Disp: 21 tablet, Rfl: 0   pantoprazole (PROTONIX) 40 MG tablet, TAKE ONE TABLET BY MOUTH DAILY, Disp: 365 tablet, Rfl: 0   pravastatin (PRAVACHOL) 40 MG tablet, TAKE ONE TABLET BY MOUTH DAILY, Disp: 365 tablet, Rfl: 0   PREVIDENT 5000 ENAMEL PROTECT 1.1-5 % PSTE, See admin instructions., Disp: , Rfl:    PROCTO-MED HC 2.5 % rectal cream, APPLY 1 CREAM RECTALLY TWICE DAILY, Disp: 90 g, Rfl: 3  Allergies:  Allergies  Allergen Reactions   Metformin And Related Diarrhea   Metformin Hcl Other (See Comments)    Past Medical History, Surgical history, Social history, and Family History were reviewed and updated.  Review of Systems: Review of Systems  Constitutional: Negative.   HENT: Negative.    Eyes: Negative.   Respiratory: Negative.    Cardiovascular: Negative.   Gastrointestinal: Negative.   Genitourinary: Negative.   Musculoskeletal: Negative.   Skin: Negative.   Neurological: Negative.   Endo/Heme/Allergies: Negative.   Psychiatric/Behavioral: Negative.     Physical Exam:  weight is 181  lb (82.1 kg). His oral temperature is 98.2 F (36.8 C). His blood pressure is 126/68 and his pulse is 56 (abnormal). His respiration is 18 and oxygen saturation is 100%.   Physical Exam Vitals reviewed.  HENT:     Head: Normocephalic and atraumatic.  Eyes:     Pupils: Pupils are equal, round, and reactive to light.  Cardiovascular:     Rate and Rhythm: Normal rate and regular rhythm.     Heart sounds: Normal heart sounds.  Pulmonary:     Effort: Pulmonary effort is normal.     Breath sounds: Normal breath sounds.  Abdominal:     General: Bowel sounds are normal.     Palpations: Abdomen is soft.  Musculoskeletal:        General: No tenderness  or deformity. Normal range of motion.     Cervical back: Normal range of motion.  Lymphadenopathy:     Cervical: No cervical adenopathy.  Skin:    General: Skin is warm and dry.     Findings: No erythema or rash.  Neurological:     Mental Status: He is alert and oriented to person, place, and time.  Psychiatric:        Behavior: Behavior normal.        Thought Content: Thought content normal.        Judgment: Judgment normal.     Lab Results  Component Value Date   WBC 9.7 03/08/2021   HGB 15.7 03/08/2021   HCT 46.8 03/08/2021   MCV 92.9 03/08/2021   PLT 226 03/08/2021     Chemistry      Component Value Date/Time   NA 139 03/08/2021 1041   NA 144 11/29/2016 1054   NA 139 12/29/2015 0954   K 4.3 03/08/2021 1041   K 4.2 11/29/2016 1054   K 4.1 12/29/2015 0954   CL 102 03/08/2021 1041   CL 102 11/29/2016 1054   CO2 30 03/08/2021 1041   CO2 30 11/29/2016 1054   CO2 26 12/29/2015 0954   BUN 20 03/08/2021 1041   BUN 22 11/29/2016 1054   BUN 20.0 12/29/2015 0954   CREATININE 1.22 03/08/2021 1041   CREATININE 1.2 11/29/2016 1054   CREATININE 1.3 12/29/2015 0954      Component Value Date/Time   CALCIUM 9.5 03/08/2021 1041   CALCIUM 9.3 11/29/2016 1054   CALCIUM 9.5 12/29/2015 0954   ALKPHOS 81 03/08/2021 1041   ALKPHOS 92 (H) 11/29/2016 1054   ALKPHOS 98 12/29/2015 0954   AST 14 (L) 03/08/2021 1041   AST 16 12/29/2015 0954   ALT 14 03/08/2021 1041   ALT 20 11/29/2016 1054   ALT 13 12/29/2015 0954   BILITOT 0.7 03/08/2021 1041   BILITOT 0.71 12/29/2015 0954      Impression and Plan: Alexander [name] is 81 year old gentleman. He has secondary polycythemia/erythrocytosis. He is JAK2 negative.   He does not need to be phlebotomized right now.  His hemoglobin is rising slowly.  However, I think we can still hold off on phlebotomize again.  We will now get him through the Winter.  I will see him back in March.      [name], MD 3/7/202311:46 AM

## 2021-03-09 LAB — FERRITIN: Ferritin: 55 ng/mL (ref 24–336)

## 2021-03-14 ENCOUNTER — Other Ambulatory Visit: Payer: Self-pay | Admitting: Hematology & Oncology

## 2021-03-14 NOTE — Progress Notes (Deleted)
Hematology and Oncology Follow Up Visit ? ?Alexander [name] ?[id number] ?October 27, 1940 81 y.o. ?03/14/2021 ? ? ?Principle Diagnosis:  ?Secondary Polycythemia/Erythrocytosis-JAK2 negative ? ?Current Therapy:   ?Phlebotomy to maintain hematocrit below 45% - 12/29/2015 ?Aspirin 81 mg by mouth daily ?    ?Interim History:  Mr.  Alexander [name] is in for a followup.  So far, he is doing pretty well.  He had COVID again.  This was a few weeks ago.  He got it from his wife.  Thankfully, he has had all of his vaccines and boosters.  He had a little bit of a "cold".  Thankfully, he really got through this okay.  His wife is doing better. ? ?He feels okay.  He is trying to stay active. ? ?He has had no problems with respect to headache.  He has had no cardiac issues.  I think he sees cardiologist soon.  He sees the cardiologist yearly. ? ?His last iron studies back in September showed a ferritin of 71 with iron saturation of 27%. ? ?He has had no problems with fever.  He has had no rashes.  He has had no change in bowel or bladder habits. ? ?Overall, I would say his performance status is [name].   ? ?[name] is given follow-up in 2 months ?Medications:  ?Current Outpatient Medications:  ?  ACCU-CHEK SOFTCLIX LANCETS lancets, Use to test blood glucose once daily, Disp: 100 each, Rfl: 3 ?  allopurinol (ZYLOPRIM) 100 MG tablet, TAKE ONE TABLET BY MOUTH DAILY, Disp: 365 tablet, Rfl: 0 ?  amLODipine (NORVASC) 10 MG tablet, TAKE ONE TABLET BY MOUTH DAILY, Disp: 90 tablet, Rfl: 3 ?  aspirin EC 81 MG tablet, Take 1 tablet (81 mg total) by mouth daily., Disp: 90 tablet, Rfl: 3 ?  Blood Glucose Monitoring Suppl (ACCU-CHEK AVIVA PLUS) w/Device KIT, Use to test blood glucose once daily, Disp: 1 kit, Rfl: 0 ?  Coenzyme Q10 300 MG CAPS, Take 1 capsule by mouth daily. , Disp: , Rfl:  ?  glucose blood (ACCU-CHEK AVIVA) test strip, Use to test blood glucose once daily, Disp: 100 each, Rfl: 3 ?  hydrocortisone (ANUSOL-HC) 25 MG suppository, Place  25 mg rectally daily as needed for hemorrhoids or anal itching., Disp: , Rfl:  ?  lisinopril (ZESTRIL) 10 MG tablet, TAKE ONE TABLET BY MOUTH DAILY, Disp: 90 tablet, Rfl: 3 ?  methylPREDNISolone (MEDROL DOSEPAK) 4 MG TBPK tablet, Take as directed, Disp: 21 tablet, Rfl: 0 ?  pantoprazole (PROTONIX) 40 MG tablet, TAKE ONE TABLET BY MOUTH DAILY, Disp: 365 tablet, Rfl: 0 ?  pravastatin (PRAVACHOL) 40 MG tablet, TAKE ONE TABLET BY MOUTH DAILY, Disp: 365 tablet, Rfl: 0 ?  PREVIDENT 5000 ENAMEL PROTECT 1.1-5 % PSTE, See admin instructions., Disp: , Rfl:  ?  PROCTO-MED HC 2.5 % rectal cream, APPLY 1 CREAM RECTALLY TWICE DAILY, Disp: 90 g, Rfl: 3 ? ?Allergies:  ?Allergies  ?Allergen Reactions  ? Metformin And Related Diarrhea  ? Metformin Hcl Other (See Comments)  ? ? ?Past Medical History, Surgical history, Social history, and Family History were reviewed and updated. ? ?Review of Systems: ?Review of Systems  ?Constitutional: Negative.   ?HENT: Negative.    ?Eyes: Negative.   ?Respiratory: Negative.    ?Cardiovascular: Negative.   ?Gastrointestinal: Negative.   ?Genitourinary: Negative.   ?Musculoskeletal: Negative.   ?Skin: Negative.   ?Neurological: Negative.   ?Endo/Heme/Allergies: Negative.   ?Psychiatric/Behavioral: Negative.    ? ?Physical Exam: ? vitals were not  taken for this visit.  ? ?Physical Exam ?Vitals reviewed.  ?HENT:  ?   Head: Normocephalic and atraumatic.  ?Eyes:  ?   Pupils: Pupils are equal, round, and reactive to light.  ?Cardiovascular:  ?   Rate and Rhythm: Normal rate and regular rhythm.  ?   Heart sounds: Normal heart sounds.  ?Pulmonary:  ?   Effort: Pulmonary effort is normal.  ?   Breath sounds: Normal breath sounds.  ?Abdominal:  ?   General: Bowel sounds are normal.  ?   Palpations: Abdomen is soft.  ?Musculoskeletal:     ?   General: No tenderness or deformity. Normal range of motion.  ?   Cervical back: Normal range of motion.  ?Lymphadenopathy:  ?   Cervical: No cervical adenopathy.   ?Skin: ?   General: Skin is warm and dry.  ?   Findings: No erythema or rash.  ?Neurological:  ?   Mental Status: He is alert and oriented to person, place, and time.  ?Psychiatric:     ?   Behavior: Behavior normal.     ?   Thought Content: Thought content normal.     ?   Judgment: Judgment normal.  ? ? ? ?Lab Results  ?Component Value Date  ? WBC 9.7 03/08/2021  ? HGB 15.7 03/08/2021  ? HCT 46.8 03/08/2021  ? MCV 92.9 03/08/2021  ? PLT 226 03/08/2021  ? ?  Chemistry   ?   ?Component Value Date/Time  ? NA 139 03/08/2021 1041  ? NA 144 11/29/2016 1054  ? NA 139 12/29/2015 0954  ? K 4.3 03/08/2021 1041  ? K 4.2 11/29/2016 1054  ? K 4.1 12/29/2015 0954  ? CL 102 03/08/2021 1041  ? CL 102 11/29/2016 1054  ? CO2 30 03/08/2021 1041  ? CO2 30 11/29/2016 1054  ? CO2 26 12/29/2015 0954  ? BUN 20 03/08/2021 1041  ? BUN 22 11/29/2016 1054  ? BUN 20.0 12/29/2015 0954  ? CREATININE 1.22 03/08/2021 1041  ? CREATININE 1.2 11/29/2016 1054  ? CREATININE 1.3 12/29/2015 0954  ?    ?Component Value Date/Time  ? CALCIUM 9.5 03/08/2021 1041  ? CALCIUM 9.3 11/29/2016 1054  ? CALCIUM 9.5 12/29/2015 0954  ? ALKPHOS 81 03/08/2021 1041  ? ALKPHOS 92 (H) 11/29/2016 1054  ? ALKPHOS 98 12/29/2015 0954  ? AST 14 (L) 03/08/2021 1041  ? AST 16 12/29/2015 0954  ? ALT 14 03/08/2021 1041  ? ALT 20 11/29/2016 1054  ? ALT 13 12/29/2015 0954  ? BILITOT 0.7 03/08/2021 1041  ? BILITOT 0.71 12/29/2015 0954  ?  ? ? ?Impression and Plan: ?[name] is 81 year old gentleman. He has secondary polycythemia/erythrocytosis. He is JAK2 negative.  ? ?He does not need to be phlebotomized right now.  His hemoglobin is rising slowly.  However, I think we can still hold off on phlebotomize again. ? ?We will now get him through the Winter.  I will see him back in March.   ?  ? ?[name], MD ?3/13/202312:59 PM ? ?

## 2021-03-14 NOTE — Progress Notes (Deleted)
Hematology and Oncology Follow Up Visit ? ?Alexander [name] ?[id number] ?October 27, 1940 81 y.o. ?03/14/2021 ? ? ?Principle Diagnosis:  ?Secondary Polycythemia/Erythrocytosis-JAK2 negative ? ?Current Therapy:   ?Phlebotomy to maintain hematocrit below 45% - 12/29/2015 ?Aspirin 81 mg by mouth daily ?    ?Interim History:  [name] is in for a followup.  So far, he is doing pretty well.  He had COVID again.  This was a few weeks ago.  He got it from his wife.  Thankfully, he has had all of his vaccines and boosters.  He had a little bit of a "cold".  Thankfully, he really got through this okay.  His wife is doing better. ? ?He feels okay.  He is trying to stay active. ? ?He has had no problems with respect to headache.  He has had no cardiac issues.  I think he sees cardiologist soon.  He sees the cardiologist yearly. ? ?His last iron studies back in September showed a ferritin of 71 with iron saturation of 27%. ? ?He has had no problems with fever.  He has had no rashes.  He has had no change in bowel or bladder habits. ? ?Overall, I would say his performance status is [name].   ? ?[name] is given follow-up in 2 months ?Medications:  ?Current Outpatient Medications:  ?  ACCU-CHEK SOFTCLIX LANCETS lancets, Use to test blood glucose once daily, Disp: 100 each, Rfl: 3 ?  allopurinol (ZYLOPRIM) 100 MG tablet, TAKE ONE TABLET BY MOUTH DAILY, Disp: 365 tablet, Rfl: 0 ?  amLODipine (NORVASC) 10 MG tablet, TAKE ONE TABLET BY MOUTH DAILY, Disp: 90 tablet, Rfl: 3 ?  aspirin EC 81 MG tablet, Take 1 tablet (81 mg total) by mouth daily., Disp: 90 tablet, Rfl: 3 ?  Blood Glucose Monitoring Suppl (ACCU-CHEK AVIVA PLUS) w/Device KIT, Use to test blood glucose once daily, Disp: 1 kit, Rfl: 0 ?  Coenzyme Q10 300 MG CAPS, Take 1 capsule by mouth daily. , Disp: , Rfl:  ?  glucose blood (ACCU-CHEK AVIVA) test strip, Use to test blood glucose once daily, Disp: 100 each, Rfl: 3 ?  hydrocortisone (ANUSOL-HC) 25 MG suppository, Place  25 mg rectally daily as needed for hemorrhoids or anal itching., Disp: , Rfl:  ?  lisinopril (ZESTRIL) 10 MG tablet, TAKE ONE TABLET BY MOUTH DAILY, Disp: 90 tablet, Rfl: 3 ?  methylPREDNISolone (MEDROL DOSEPAK) 4 MG TBPK tablet, Take as directed, Disp: 21 tablet, Rfl: 0 ?  pantoprazole (PROTONIX) 40 MG tablet, TAKE ONE TABLET BY MOUTH DAILY, Disp: 365 tablet, Rfl: 0 ?  pravastatin (PRAVACHOL) 40 MG tablet, TAKE ONE TABLET BY MOUTH DAILY, Disp: 365 tablet, Rfl: 0 ?  PREVIDENT 5000 ENAMEL PROTECT 1.1-5 % PSTE, See admin instructions., Disp: , Rfl:  ?  PROCTO-MED HC 2.5 % rectal cream, APPLY 1 CREAM RECTALLY TWICE DAILY, Disp: 90 g, Rfl: 3 ? ?Allergies:  ?Allergies  ?Allergen Reactions  ? Metformin And Related Diarrhea  ? Metformin Hcl Other (See Comments)  ? ? ?Past Medical History, Surgical history, Social history, and Family History were reviewed and updated. ? ?Review of Systems: ?Review of Systems  ?Constitutional: Negative.   ?HENT: Negative.    ?Eyes: Negative.   ?Respiratory: Negative.    ?Cardiovascular: Negative.   ?Gastrointestinal: Negative.   ?Genitourinary: Negative.   ?Musculoskeletal: Negative.   ?Skin: Negative.   ?Neurological: Negative.   ?Endo/Heme/Allergies: Negative.   ?Psychiatric/Behavioral: Negative.    ? ?Physical Exam: ? vitals were not  taken for this visit.  ? ?Physical Exam ?Vitals reviewed.  ?HENT:  ?   Head: Normocephalic and atraumatic.  ?Eyes:  ?   Pupils: Pupils are equal, round, and reactive to light.  ?Cardiovascular:  ?   Rate and Rhythm: Normal rate and regular rhythm.  ?   Heart sounds: Normal heart sounds.  ?Pulmonary:  ?   Effort: Pulmonary effort is normal.  ?   Breath sounds: Normal breath sounds.  ?Abdominal:  ?   General: Bowel sounds are normal.  ?   Palpations: Abdomen is soft.  ?Musculoskeletal:     ?   General: No tenderness or deformity. Normal range of motion.  ?   Cervical back: Normal range of motion.  ?Lymphadenopathy:  ?   Cervical: No cervical adenopathy.   ?Skin: ?   General: Skin is warm and dry.  ?   Findings: No erythema or rash.  ?Neurological:  ?   Mental Status: He is alert and oriented to person, place, and time.  ?Psychiatric:     ?   Behavior: Behavior normal.     ?   Thought Content: Thought content normal.     ?   Judgment: Judgment normal.  ? ? ? ?Lab Results  ?Component Value Date  ? WBC 9.7 03/08/2021  ? HGB 15.7 03/08/2021  ? HCT 46.8 03/08/2021  ? MCV 92.9 03/08/2021  ? PLT 226 03/08/2021  ? ?  Chemistry   ?   ?Component Value Date/Time  ? NA 139 03/08/2021 1041  ? NA 144 11/29/2016 1054  ? NA 139 12/29/2015 0954  ? K 4.3 03/08/2021 1041  ? K 4.2 11/29/2016 1054  ? K 4.1 12/29/2015 0954  ? CL 102 03/08/2021 1041  ? CL 102 11/29/2016 1054  ? CO2 30 03/08/2021 1041  ? CO2 30 11/29/2016 1054  ? CO2 26 12/29/2015 0954  ? BUN 20 03/08/2021 1041  ? BUN 22 11/29/2016 1054  ? BUN 20.0 12/29/2015 0954  ? CREATININE 1.22 03/08/2021 1041  ? CREATININE 1.2 11/29/2016 1054  ? CREATININE 1.3 12/29/2015 0954  ?    ?Component Value Date/Time  ? CALCIUM 9.5 03/08/2021 1041  ? CALCIUM 9.3 11/29/2016 1054  ? CALCIUM 9.5 12/29/2015 0954  ? ALKPHOS 81 03/08/2021 1041  ? ALKPHOS 92 (H) 11/29/2016 1054  ? ALKPHOS 98 12/29/2015 0954  ? AST 14 (L) 03/08/2021 1041  ? AST 16 12/29/2015 0954  ? ALT 14 03/08/2021 1041  ? ALT 20 11/29/2016 1054  ? ALT 13 12/29/2015 0954  ? BILITOT 0.7 03/08/2021 1041  ? BILITOT 0.71 12/29/2015 0954  ?  ? ? ?Impression and Plan: ?Alexander [name] is 81 year old gentleman. He has secondary polycythemia/erythrocytosis. He is JAK2 negative.  ? ?He does not need to be phlebotomized right now.  His hemoglobin is rising slowly.  However, I think we can still hold off on phlebotomize again. ? ?We will now get him through the Winter.  I will see him back in March.   ?  ? ?[name], MD ?3/13/20231:00 PM ? ?

## 2021-03-16 ENCOUNTER — Ambulatory Visit (INDEPENDENT_AMBULATORY_CARE_PROVIDER_SITE_OTHER): Payer: Medicare HMO | Admitting: Adult Health

## 2021-03-16 ENCOUNTER — Telehealth: Payer: Self-pay | Admitting: Adult Health

## 2021-03-16 ENCOUNTER — Encounter: Payer: Self-pay | Admitting: Adult Health

## 2021-03-16 VITALS — BP 122/82 | HR 75 | Temp 97.7°F | Ht 70.25 in | Wt 178.0 lb

## 2021-03-16 DIAGNOSIS — R7302 Impaired glucose tolerance (oral): Secondary | ICD-10-CM

## 2021-03-16 DIAGNOSIS — E7439 Other disorders of intestinal carbohydrate absorption: Secondary | ICD-10-CM

## 2021-03-16 DIAGNOSIS — I10 Essential (primary) hypertension: Secondary | ICD-10-CM | POA: Diagnosis not present

## 2021-03-16 DIAGNOSIS — K219 Gastro-esophageal reflux disease without esophagitis: Secondary | ICD-10-CM | POA: Diagnosis not present

## 2021-03-16 DIAGNOSIS — Z0000 Encounter for general adult medical examination without abnormal findings: Secondary | ICD-10-CM

## 2021-03-16 DIAGNOSIS — D45 Polycythemia vera: Secondary | ICD-10-CM

## 2021-03-16 DIAGNOSIS — E785 Hyperlipidemia, unspecified: Secondary | ICD-10-CM

## 2021-03-16 LAB — COMPREHENSIVE METABOLIC PANEL
ALT: 17 U/L (ref 0–53)
AST: 17 U/L (ref 0–37)
Albumin: 4.4 g/dL (ref 3.5–5.2)
Alkaline Phosphatase: 78 U/L (ref 39–117)
BUN: 25 mg/dL — ABNORMAL HIGH (ref 6–23)
CO2: 24 mEq/L (ref 19–32)
Calcium: 9.9 mg/dL (ref 8.4–10.5)
Chloride: 103 mEq/L (ref 96–112)
Creatinine, Ser: 1.37 mg/dL (ref 0.40–1.50)
GFR: 48.53 mL/min — ABNORMAL LOW (ref >60.00)
Glucose, Bld: 127 mg/dL — ABNORMAL HIGH (ref 70–99)
Potassium: 4.2 mEq/L (ref 3.5–5.1)
Sodium: 140 mEq/L (ref 135–145)
Total Bilirubin: 0.7 mg/dL (ref 0.2–1.2)
Total Protein: 7.3 g/dL (ref 6.0–8.3)

## 2021-03-16 LAB — LIPID PANEL
Cholesterol: 170 mg/dL (ref 0–200)
HDL: 47.7 mg/dL (ref >39.00)
LDL Cholesterol: 85 mg/dL (ref 0–99)
NonHDL: 121.93
Total CHOL/HDL Ratio: 4
Triglycerides: 183 mg/dL — ABNORMAL HIGH (ref 0.0–149.0)
VLDL: 36.6 mg/dL (ref 0.0–40.0)

## 2021-03-16 LAB — CBC WITH DIFFERENTIAL/PLATELET
Basophils Absolute: 0.1 10*3/uL (ref 0.0–0.1)
Basophils Relative: 0.6 % (ref 0.0–3.0)
Eosinophils Absolute: 0.3 10*3/uL (ref 0.0–0.7)
Eosinophils Relative: 3.2 % (ref 0.0–5.0)
HCT: 46.1 % (ref 39.0–52.0)
Hemoglobin: 15.3 g/dL (ref 13.0–17.0)
Lymphocytes Relative: 18.6 % (ref 12.0–46.0)
Lymphs Abs: 1.8 10*3/uL (ref 0.7–4.0)
MCHC: 33.2 g/dL (ref 30.0–36.0)
MCV: 94.1 fl (ref 78.0–100.0)
Monocytes Absolute: 1.2 10*3/uL — ABNORMAL HIGH (ref 0.1–1.0)
Monocytes Relative: 12.6 % — ABNORMAL HIGH (ref 3.0–12.0)
Neutro Abs: 6.4 10*3/uL (ref 1.4–7.7)
Neutrophils Relative %: 65 % (ref 43.0–77.0)
Platelets: 257 10*3/uL (ref 150.0–400.0)
RBC: 4.89 Mil/uL (ref 4.22–5.81)
RDW: 15.3 % (ref 11.5–15.5)
WBC: 9.8 10*3/uL (ref 4.0–10.5)

## 2021-03-16 LAB — HEMOGLOBIN A1C: Hgb A1c MFr Bld: 6.8 % — ABNORMAL HIGH (ref 4.6–6.5)

## 2021-03-16 LAB — TSH: TSH: 1.16 u[IU]/mL (ref 0.35–5.50)

## 2021-03-16 NOTE — Progress Notes (Signed)
Subjective:    Patient ID: Alexander [name], male    DOB: 03/29/1940, 81 y.o.   MRN: [id number]  HPI Patient presents for yearly preventative medicine examination. He is a pleasant 81 year old male who  has a past medical history of Arthritis, CAD (coronary artery disease), Diverticulosis of colon, GERD (gastroesophageal reflux disease), GI bleed, Gout, Hematuria, microscopic, Hemorrhoids, Hyperglycemia, Hyperlipidemia, Hypertension, Lumbar back pain, Microalbuminuria, Overweight(278.02), Polycythemia rubra vera (HCC), and Tubular adenoma of colon (07/2012).  Essential hypertension-takes lisinopril 10 mg daily and Norvasc 10 mg daily.  He denies dizziness, lightheadedness, chest pain, shortness of breath, or syncopal episodes BP Readings from Last 3 Encounters:  03/16/21 122/82  03/08/21 126/68  12/08/20 114/64   Hyperlipidemia/CAD-managed pravastatin 40 mg and ASA 81 mg. He is followed by Cardiology yearly.   He denies myalgia or fatigue Lab Results  Component Value Date   CHOL 152 03/12/2020   HDL 46.00 03/12/2020   LDLCALC 88 03/12/2020   TRIG 89.0 03/12/2020   CHOLHDL 3 03/12/2020   GERD-takes Protonix 40 mg daily.  Feels well controlled on this medication  Polycythemia Vera-JAK2 negative.  Is followed by hematology on a routine basis  Glucose intolerance-diet controlled Lab Results  Component Value Date   HGBA1C 6.2 03/12/2020    All immunizations and health maintenance protocols were reviewed with the patient and needed orders were placed.  Appropriate screening laboratory values were ordered for the patient including screening of hyperlipidemia, renal function and hepatic function.  Medication reconciliation,  past medical history, social history, problem list and allergies were reviewed in detail with the patient  Goals were established with regard to weight loss, exercise, and  diet in compliance with medications.  He does eat a very heart healthy diet and stays  active, enjoys swimming. Wt Readings from Last 3 Encounters:  03/16/21 178 lb (80.7 kg)  03/08/21 181 lb (82.1 kg)  12/08/20 180 lb (81.6 kg)   Has no acute complaints today   Review of Systems  Constitutional: Negative.   HENT: Negative.    Eyes: Negative.   Respiratory: Negative.    Cardiovascular: Negative.   Gastrointestinal: Negative.   Endocrine: Negative.   Genitourinary: Negative.   Musculoskeletal: Negative.   Skin: Negative.   Allergic/Immunologic: Negative.   Neurological: Negative.   Hematological: Negative.   Psychiatric/Behavioral: Negative.    All other systems reviewed and are negative.  Past Medical History:  Diagnosis Date   Arthritis    "minor; shoulders primarily" (05/20/2013)   CAD (coronary artery disease)    a. 05/2013 - Chest pain/unstable angina and dynamic EKG changes showing cath showing atherosclerotic coronary artery disease manifested as diffuse ectasia, normal EF.   Diverticulosis of colon    GERD (gastroesophageal reflux disease)    GI bleed    secondary to Aspirin   Gout    Hematuria, microscopic    Hemorrhoids    Hyperglycemia    Hyperlipidemia    Hypertension    Lumbar back pain    Microalbuminuria    Overweight(278.02)    Polycythemia rubra vera (HCC)    Tubular adenoma of colon 07/2012    Social History   Socioeconomic History   Marital status: Married    Spouse name: Not on file   Number of children: 3   Years of education: Not on file   Highest education level: Not on file  Occupational History   Occupation: retired Garment/textile technologist: RETIRED  Tobacco Use   Smoking  status: Never   Smokeless tobacco: Never   Tobacco comments:    NEVER USED TOBACCO  Vaping Use   Vaping Use: Never used  Substance and Sexual Activity   Alcohol use: No    Alcohol/week: 0.0 standard drinks   Drug use: No   Sexual activity: Yes    Partners: Female  Other Topics Concern   Not on file  Social History Narrative   Marriedfor 52  years    Daughter, Physicist, medical (pediatrician--lives in [place], [place]), one in [place] - Warden/ranger. One in [place] - Chartered loss adjuster          Social Determinants of Corporate investment banker Strain: Low Risk    Difficulty of Paying Living Expenses: Not hard at all  Food Insecurity: No Food Insecurity   Worried About Programme researcher, broadcasting/film/video in the Last Year: Never true   Barista in the Last Year: Never true  Transportation Needs: No Transportation Needs   Lack of Transportation (Medical): No   Lack of Transportation (Non-Medical): No  Physical Activity: Sufficiently Active   Days of Exercise per Week: 7 days   Minutes of Exercise per Session: 60 min  Stress: No Stress Concern Present   Feeling of Stress : Not at all  Social Connections: Socially Integrated   Frequency of Communication with Friends and Family: Three times a week   Frequency of Social Gatherings with Friends and Family: Three times a week   Attends Religious Services: More than 4 times per year   Active Member of Clubs or Organizations: Yes   Attends Engineer, structural: More than 4 times per year   Marital Status: Married  Catering manager Violence: Not At Risk   Fear of Current or Ex-Partner: No   Emotionally Abused: No   Physically Abused: No   Sexually Abused: No    Past Surgical History:  Procedure Laterality Date   CARDIAC CATHETERIZATION  05/20/2013   CATARACT EXTRACTION W/ INTRAOCULAR LENS  IMPLANT, BILATERAL Bilateral 2008   CYSTECTOMY  ~ 2000   " SEBACEOUS cyst removed from between shoulder blades"   EXCISIONAL HEMORRHOIDECTOMY  1970's   INGUINAL HERNIA REPAIR Right ~ 2010   LEFT HEART CATHETERIZATION WITH CORONARY ANGIOGRAM N/A 05/20/2013   Procedure: LEFT HEART CATHETERIZATION WITH CORONARY ANGIOGRAM;  Surgeon: Peter M Swaziland, MD;  Location[facility] C[facility] LAB;  Service: Cardiovascular;  Laterality: N/A;   TONSILLECTOMY AND ADENOIDECTOMY  1940's   VASECTOMY      Family History  Problem Relation Age of  Onset   Colon cancer Mother        died at 75, colorectal   Diabetes Mother    Heart disease Father 76       MI   Diabetes Father    Stomach cancer Maternal Grandfather    Cancer Maternal Uncle     Allergies  Allergen Reactions   Metformin And Related Diarrhea   Metformin Hcl Other (See Comments)    Current Outpatient Medications on File Prior to Visit  Medication Sig Dispense Refill   ACCU-CHEK SOFTCLIX LANCETS lancets Use to test blood glucose once daily 100 each 3   allopurinol (ZYLOPRIM) 100 MG tablet TAKE ONE TABLET BY MOUTH DAILY 365 tablet 0   amLODipine (NORVASC) 10 MG tablet TAKE ONE TABLET BY MOUTH DAILY 90 tablet 3   aspirin EC 81 MG tablet Take 1 tablet (81 mg total) by mouth daily. 90 tablet 3   Blood Glucose Monitoring Suppl (ACCU-CHEK AVIVA PLUS)  w/Device KIT Use to test blood glucose once daily 1 kit 0   Coenzyme Q10 300 MG CAPS Take 1 capsule by mouth daily.      glucose blood (ACCU-CHEK AVIVA) test strip Use to test blood glucose once daily 100 each 3   hydrocortisone (ANUSOL-HC) 25 MG suppository Place 25 mg rectally daily as needed for hemorrhoids or anal itching.     lisinopril (ZESTRIL) 10 MG tablet TAKE ONE TABLET BY MOUTH DAILY 90 tablet 3   methylPREDNISolone (MEDROL DOSEPAK) 4 MG TBPK tablet Take as directed 21 tablet 0   pantoprazole (PROTONIX) 40 MG tablet TAKE ONE TABLET BY MOUTH DAILY 365 tablet 0   pravastatin (PRAVACHOL) 40 MG tablet TAKE ONE TABLET BY MOUTH DAILY 365 tablet 0   PREVIDENT 5000 ENAMEL PROTECT 1.1-5 % PSTE See admin instructions.     PROCTO-MED HC 2.5 % rectal cream APPLY 1 CREAM RECTALLY TWICE DAILY 90 g 3   No current facility-administered medications on file prior to visit.    BP 122/82   Pulse 75   Temp 97.7 F (36.5 C) (Oral)   Ht 5' 10.25" (1.784 m)   Wt 178 lb (80.7 kg)   SpO2 100%   BMI 25.36 kg/m       Objective:   Physical Exam Vitals and nursing note reviewed.  Constitutional:      General: He is not in  acute distress.    Appearance: Normal appearance. He is well-developed and normal weight.  HENT:     Head: Normocephalic and atraumatic.     Right Ear: Tympanic membrane, ear canal and external ear normal. There is no impacted cerumen.     Left Ear: Tympanic membrane, ear canal and external ear normal. There is no impacted cerumen.     Nose: Nose normal. No congestion or rhinorrhea.     Mouth/Throat:     Mouth: Mucous membranes are moist.     Pharynx: Oropharynx is clear. No oropharyngeal exudate or posterior oropharyngeal erythema.  Eyes:     General:        Right eye: No discharge.        Left eye: No discharge.     Extraocular Movements: Extraocular movements intact.     Conjunctiva/sclera: Conjunctivae normal.     Pupils: Pupils are equal, round, and reactive to light.  Neck:     Vascular: No carotid bruit.     Trachea: No tracheal deviation.  Cardiovascular:     Rate and Rhythm: Normal rate and regular rhythm.     Pulses: Normal pulses.     Heart sounds: Normal heart sounds. No murmur heard.   No friction rub. No gallop.  Pulmonary:     Effort: Pulmonary effort is normal. No respiratory distress.     Breath sounds: Normal breath sounds. No stridor. No wheezing, rhonchi or rales.  Chest:     Chest wall: No tenderness.  Abdominal:     General: Bowel sounds are normal. There is no distension.     Palpations: Abdomen is soft. There is no mass.     Tenderness: There is no abdominal tenderness. There is no right CVA tenderness, left CVA tenderness, guarding or rebound.     Hernia: No hernia is present.  Musculoskeletal:        General: No swelling, tenderness, deformity or signs of injury. Normal range of motion.     Right lower leg: No edema.     Left lower leg: No edema.  Lymphadenopathy:  Cervical: No cervical adenopathy.  Skin:    General: Skin is warm and dry.     Capillary Refill: Capillary refill takes less than 2 seconds.     Coloration: Skin is not jaundiced or  pale.     Findings: No bruising, erythema, lesion or rash.  Neurological:     General: No focal deficit present.     Mental Status: He is alert and oriented to person, place, and time.     Cranial Nerves: No cranial nerve deficit.     Sensory: No sensory deficit.     Motor: No weakness.     Coordination: Coordination normal.     Gait: Gait normal.     Deep Tendon Reflexes: Reflexes normal.  Psychiatric:        Mood and Affect: Mood normal.        Behavior: Behavior normal.        Thought Content: Thought content normal.        Judgment: Judgment normal.      Assessment & Plan:  1. Routine general medical examination at a health care facility - Benign exam. Remarkable 81 year old male - Follow up in one year or sooner if needed - CBC with Differential/Platelet; Future - Comprehensive metabolic panel; Future - Hemoglobin A1c; Future - Lipid panel; Future - TSH; Future - TSH - Lipid panel - Hemoglobin A1c - Comprehensive metabolic panel - CBC with Differential/Platelet  2. Essential hypertension - Well controlled. No change in medications  - CBC with Differential/Platelet; Future - Comprehensive metabolic panel; Future - Hemoglobin A1c; Future - Lipid panel; Future - TSH; Future - TSH - Lipid panel - Hemoglobin A1c - Comprehensive metabolic panel - CBC with Differential/Platelet  3. Gastroesophageal reflux disease without esophagitis - Continue PPI  - CBC with Differential/Platelet; Future - Comprehensive metabolic panel; Future - Hemoglobin A1c; Future - Lipid panel; Future - TSH; Future - TSH - Lipid panel - Hemoglobin A1c - Comprehensive metabolic panel - CBC with Differential/Platelet  4. Polycythemia vera (HCC) - Follow up with hematology as directed - CBC with Differential/Platelet; Future - Comprehensive metabolic panel; Future - Hemoglobin A1c; Future - Lipid panel; Future - TSH; Future - TSH - Lipid panel - Hemoglobin A1c - Comprehensive  metabolic panel - CBC with Differential/Platelet  5. Hyperlipidemia, unspecified hyperlipidemia type - Consider changing to higher intensity statin  - CBC with Differential/Platelet; Future - Comprehensive metabolic panel; Future - Hemoglobin A1c; Future - Lipid panel; Future - TSH; Future - TSH - Lipid panel - Hemoglobin A1c - Comprehensive metabolic panel - CBC with Differential/Platelet  6. Glucose intolerance -Consider metformin  - CBC with Differential/Platelet; Future - Comprehensive metabolic panel; Future - Hemoglobin A1c; Future - Lipid panel; Future - TSH; Future - TSH - Lipid panel - Hemoglobin A1c - Comprehensive metabolic panel - CBC with Differential/Platelet  Shirline Frees, NP

## 2021-03-16 NOTE — Patient Instructions (Signed)
Health Maintenance Due  ?Topic Date Due  ? COLONOSCOPY (Pts 45-61yr Insurance coverage will need to be confirmed)  07/16/2017  ? COVID-19 Vaccine (5 - Booster for Pfizer series) 12/20/2020  ? ? ?[facility] 09/08/2020 03/12/2020 09/26/2019  ?Decreased Interest - 0 0  ?Down, Depressed, Hopeless 0 0 0  ?PHQ - 2 Score 0 0 0  ?Altered sleeping - - 0  ?Tired, decreased energy - - 0  ?Change in appetite - - 0  ?Feeling bad or failure about yourself  - - 0  ?Trouble concentrating - - 0  ?Moving slowly or fidgety/restless - - 0  ?Suicidal thoughts - - 0  ?PHQ-9 Score - - 0  ?Difficult doing work/chores - - Not difficult at all  ?Some recent data might be hidden  ? ? ?

## 2021-03-16 NOTE — Telephone Encounter (Signed)
Updated patient on his labs.  ? ?A1c up to 6.8 from baseline 6.0  ? ?Will have him work on lifestyle modifications and follow up in 3 months for recheck  ?

## 2021-03-18 ENCOUNTER — Other Ambulatory Visit: Payer: Self-pay

## 2021-03-18 ENCOUNTER — Inpatient Hospital Stay: Payer: Medicare HMO

## 2021-03-18 NOTE — Patient Instructions (Signed)
Therapeutic Phlebotomy °Therapeutic phlebotomy is the planned removal of blood from a person's body for the purpose of treating a medical condition. The procedure is lot like donating blood. Usually, about a pint (470 mL, or 0.47 L) of blood is removed. The average adult has 9-12 pints (4.3-5.7 L) of blood in his or her body. °Therapeutic phlebotomy may be used to treat the following medical conditions: °Hemochromatosis. This is a condition in which the blood contains too much iron. °Polycythemia vera. This is a condition in which the blood contains too many red blood cells. °Porphyria cutanea tarda. This is a disease in which an important part of hemoglobin is not made properly. It results in the buildup of abnormal amounts of porphyrins in the body. °Sickle cell disease. This is a condition in which the red blood cells form an abnormal crescent shape rather than a round shape. °Tell a health care provider about: °Any allergies you have. °All medicines you are taking, including vitamins, herbs, eye drops, creams, and over-the-counter medicines. °Any bleeding problems you have. °Any surgeries you have had. °Any medical conditions you have. °Whether you are pregnant or may be pregnant. °What are the risks? °Generally, this is a safe procedure. However, problems may occur, including: °Nausea or light-headedness. °Low blood pressure (hypotension). °Soreness, bleeding, swelling, or bruising at the needle insertion site. °Infection. °What happens before the procedure? °Ask your health care provider about: °Changing or stopping your regular medicines. This is especially important if you are taking diabetes medicines or blood thinners. °Taking medicines such as aspirin and ibuprofen. These medicines can thin your blood. Do not take these medicines unless your health care provider tells you to take them. °Taking over-the-counter medicines, vitamins, herbs, and supplements. °Wear clothing with sleeves that can be raised  above the elbow. °You may have a blood sample taken. °Your blood pressure, pulse rate, and breathing rate will be measured. °What happens during the procedure? ° °You may be given a medicine to numb the area (local anesthetic). °A tourniquet will be placed on your arm. °A needle will be put into one of your veins. °Tubing and a collection bag will be attached to the needle. °Blood will flow through the needle and tubing into the collection bag. °The collection bag will be placed lower than your arm so gravity can help the blood flow into the bag. °You may be asked to open and close your hand slowly and continually during the entire collection. °After the specified amount of blood has been removed from your body, the collection bag and tubing will be clamped. °The needle will be removed from your vein. °Pressure will be held on the needle site to stop the bleeding. °A bandage (dressing) will be placed over the needle insertion site. °The procedure may vary among health care providers and hospitals. °What happens after the procedure? °Your blood pressure, pulse rate, and breathing rate will be measured after the procedure. °You will be encouraged to drink fluids. °You will be encouraged to eat a snack to prevent a low blood sugar level. °Your recovery will be assessed and monitored. °Return to your normal activities as told by your health care provider. °Summary °Therapeutic phlebotomy is the planned removal of blood from a person's body for the purpose of treating a medical condition. °Therapeutic phlebotomy may be used to treat hemochromatosis, polycythemia vera, porphyria cutanea tarda, or sickle cell disease. °In the procedure, a needle is inserted and about a pint (470 mL, or 0.47 L) of blood is   removed. The average adult has 9-12 pints (4.3-5.7 L) of blood in the body. °This is generally a safe procedure, but it can sometimes cause problems such as nausea, light-headedness, or low blood pressure  (hypotension). °This information is not intended to replace advice given to you by your health care provider. Make sure you discuss any questions you have with your health care provider. °Document Revised: 06/16/2020 Document Reviewed: 06/16/2020 °Elsevier Patient Education © 2022 [employer] Inc. ° °

## 2021-03-22 ENCOUNTER — Encounter: Payer: Self-pay | Admitting: Hematology & Oncology

## 2021-03-22 NOTE — Progress Notes (Unsigned)
error 

## 2021-03-22 NOTE — Progress Notes (Unsigned)
03/08/2021: Pt left before being seen  ?

## 2021-03-23 ENCOUNTER — Ambulatory Visit: Payer: Medicare HMO | Admitting: Hematology & Oncology

## 2021-04-13 ENCOUNTER — Encounter: Payer: Self-pay | Admitting: Hematology & Oncology

## 2021-04-25 ENCOUNTER — Encounter: Payer: Self-pay | Admitting: Cardiology

## 2021-04-25 ENCOUNTER — Ambulatory Visit (INDEPENDENT_AMBULATORY_CARE_PROVIDER_SITE_OTHER): Payer: Medicare HMO | Admitting: Cardiology

## 2021-04-25 VITALS — BP 120/70 | HR 58 | Ht 70.25 in | Wt 174.0 lb

## 2021-04-25 DIAGNOSIS — I2510 Atherosclerotic heart disease of native coronary artery without angina pectoris: Secondary | ICD-10-CM | POA: Diagnosis not present

## 2021-04-25 DIAGNOSIS — E785 Hyperlipidemia, unspecified: Secondary | ICD-10-CM

## 2021-04-25 DIAGNOSIS — I10 Essential (primary) hypertension: Secondary | ICD-10-CM

## 2021-04-25 DIAGNOSIS — I2583 Coronary atherosclerosis due to lipid rich plaque: Secondary | ICD-10-CM | POA: Diagnosis not present

## 2021-04-25 DIAGNOSIS — D45 Polycythemia vera: Secondary | ICD-10-CM

## 2021-04-25 NOTE — Assessment & Plan Note (Addendum)
On amlodipine 10 mg.  Also on lisinopril 10 mg.  Overall doing well.  Well-controlled.  He is not on beta-blockers because of prior bradycardia. ?

## 2021-04-25 NOTE — Assessment & Plan Note (Signed)
On pravastatin.  Uses good Rx.  40 mg of pravastatin.  No myalgias. ?

## 2021-04-25 NOTE — Patient Instructions (Signed)
Medication Instructions:  ?The current medical regimen is effective;  continue present plan and medications. ? ?*If you need a refill on your cardiac medications before your next appointment, please call your pharmacy* ? ?Follow-Up: ?At [facility], you and your health needs are our priority.  As part of our continuing mission to provide you with exceptional heart care, we have created designated Provider Care Teams.  These Care Teams include your primary Cardiologist (physician) and Advanced Practice Providers (APPs -  Physician Assistants and Nurse Practitioners) who all work together to provide you with the care you need, when you need it. ? ?We recommend signing up for the patient portal called "MyChart".  Sign up information is provided on this After Visit Summary.  MyChart is used to connect with patients for Virtual Visits (Telemedicine).  Patients are able to view lab/test results, encounter notes, upcoming appointments, etc.  Non-urgent messages can be sent to your provider as well.   ?To learn more about what you can do with MyChart, go to [email or web address].   ? ?Your next appointment:   ?1 year(s) ? ?The format for your next appointment:   ?In Person ? ?Provider:   ?Candee Furbish, MD   ? ?Thank you for choosing [facility]!! ? ? ? ?Important Information About Sugar ? ? ? ? ?  ?

## 2021-04-25 NOTE — Assessment & Plan Note (Signed)
Previously nonobstructive coronary artery disease with 30% left main.  Overall doing very well without any anginal symptoms.  Continue with goal-directed medical therapy which includes statin and antihypertensive.  LDL goal less than 70. ?

## 2021-04-25 NOTE — Progress Notes (Signed)
?Cardiology Office Note:   ? ?Date:  04/25/2021  ? ?ID:  Alexander [name], DOB 01-14-1940, MRN [id number] ? ?PCP:  [name], NP ?  ?[facility] Providers ?Cardiologist:  Candee Furbish, MD    ? ?Referring MD: [name], NP  ? ? ?History of Present Illness:   ? ?Alexander [name] is a 81 y.o. male here for follow-up of coronary disease. ?Cardiac catheterization 2015: Showed no significant obstructive disease, 30% left main plaque. ?Echocardiogram 2010-EF 65% ?Nuclear stress test 2014-normal study ?  ?Previously enjoying swimming in his pool, saline pool, in the summertime.  Travels to [place] prior to [employer] yearly.  His daughter is there. ? ?[facility] tested for PAD and said it was positive.  ? ?Was 240 pounds at max. A1c 6.1 to 6.8 for 20 years. Lost 10 more pounds, cut carbs.  ? ?Doing well. ? ?Past Medical History:  ?Diagnosis Date  ? Arthritis   ? "minor; shoulders primarily" (05/20/2013)  ? CAD (coronary artery disease)   ? a. 05/2013 - Chest pain/unstable angina and dynamic EKG changes showing cath showing atherosclerotic coronary artery disease manifested as diffuse ectasia, normal EF.  ? Diverticulosis of colon   ? GERD (gastroesophageal reflux disease)   ? GI bleed   ? secondary to Aspirin  ? Gout   ? Hematuria, microscopic   ? Hemorrhoids   ? Hyperglycemia   ? Hyperlipidemia   ? Hypertension   ? Lumbar back pain   ? Microalbuminuria   ? Overweight(278.02)   ? Polycythemia rubra vera ([place])   ? Tubular adenoma of colon 07/2012  ? ? ?Past Surgical History:  ?Procedure Laterality Date  ? CARDIAC CATHETERIZATION  05/20/2013  ? CATARACT EXTRACTION W/ INTRAOCULAR LENS  IMPLANT, BILATERAL Bilateral 2008  ? CYSTECTOMY  ~ 2000  ? " SEBACEOUS cyst removed from between shoulder blades"  ? EXCISIONAL HEMORRHOIDECTOMY  1970's  ? INGUINAL HERNIA REPAIR Right ~ 2010  ? LEFT HEART CATHETERIZATION WITH CORONARY ANGIOGRAM N/A 05/20/2013  ? Procedure: LEFT HEART CATHETERIZATION WITH CORONARY ANGIOGRAM;  Surgeon:  Peter M Martinique, MD;  Location: [facility] CATH LAB;  Service: Cardiovascular;  Laterality: N/A;  ? TONSILLECTOMY AND ADENOIDECTOMY  1940's  ? VASECTOMY    ? ? ?Current Medications: ?Current Meds  ?Medication Sig  ? ACCU-CHEK SOFTCLIX LANCETS lancets Use to test blood glucose once daily  ? allopurinol (ZYLOPRIM) 100 MG tablet TAKE ONE TABLET BY MOUTH DAILY  ? amLODipine (NORVASC) 10 MG tablet TAKE ONE TABLET BY MOUTH DAILY  ? aspirin EC 81 MG tablet Take 1 tablet (81 mg total) by mouth daily.  ? Blood Glucose Monitoring Suppl (ACCU-CHEK AVIVA PLUS) w/Device KIT Use to test blood glucose once daily  ? Coenzyme Q10 300 MG CAPS Take 1 capsule by mouth daily.   ? glucose blood (ACCU-CHEK AVIVA) test strip Use to test blood glucose once daily  ? hydrocortisone (ANUSOL-HC) 25 MG suppository Place 25 mg rectally daily as needed for hemorrhoids or anal itching.  ? lisinopril (ZESTRIL) 10 MG tablet TAKE ONE TABLET BY MOUTH DAILY  ? pantoprazole (PROTONIX) 40 MG tablet TAKE ONE TABLET BY MOUTH DAILY  ? pravastatin (PRAVACHOL) 40 MG tablet TAKE ONE TABLET BY MOUTH DAILY  ? PREVIDENT 5000 ENAMEL PROTECT 1.1-5 % PSTE See admin instructions.  ? PROCTO-MED HC 2.5 % rectal cream APPLY 1 CREAM RECTALLY TWICE DAILY  ?  ? ?Allergies:   Metformin and related and Metformin hcl  ? ?Social History  ? ?Socioeconomic History  ?  Marital status: Married  ?  Spouse name: Not on file  ? Number of children: 3  ? Years of education: Not on file  ? Highest education level: Not on file  ?Occupational History  ? Occupation: retired Chief Financial Officer  ?  Employer: RETIRED  ?Tobacco Use  ? Smoking status: Never  ? Smokeless tobacco: Never  ? Tobacco comments:  ?  NEVER USED TOBACCO  ?Vaping Use  ? Vaping Use: Never used  ?Substance and Sexual Activity  ? Alcohol use: No  ?  Alcohol/week: 0.0 standard drinks  ? Drug use: No  ? Sexual activity: Yes  ?  Partners: Female  ?Other Topics Concern  ? Not on file  ?Social History Narrative  ? Marriedfor 52 years   ? Daughter,  Alexander [name] (pediatrician--lives in [place], [place]), one in [place] - Engineer, water. One in [place] - Chief Strategy Officer   ?   ?   ? ?Social Determinants of Health  ? ?Financial Resource Strain: Low Risk   ? Difficulty of Paying Living Expenses: Not hard at all  ?Food Insecurity: No Food Insecurity  ? Worried About Charity fundraiser in the Last Year: Never true  ? Ran Out of Food in the Last Year: Never true  ?Transportation Needs: No Transportation Needs  ? Lack of Transportation (Medical): No  ? Lack of Transportation (Non-Medical): No  ?Physical Activity: Sufficiently Active  ? Days of Exercise per Week: 7 days  ? Minutes of Exercise per Session: 60 min  ?Stress: No Stress Concern Present  ? Feeling of Stress : Not at all  ?Social Connections: Socially Integrated  ? Frequency of Communication with Friends and Family: Three times a week  ? Frequency of Social Gatherings with Friends and Family: Three times a week  ? Attends Religious Services: More than 4 times per year  ? Active Member of Clubs or Organizations: Yes  ? Attends Archivist Meetings: More than 4 times per year  ? Marital Status: Married  ?  ? ?Family History: ?The patient's family history includes Cancer in his maternal uncle; Colon cancer in his mother; Diabetes in his father and mother; Heart disease (age of onset: 3) in his father; Stomach cancer in his maternal grandfather. ? ?ROS:   ?Please see the history of present illness.    ?No fevers no chills no nausea no vomiting all other systems reviewed and are negative. ? ?EKGs/Labs/Other Studies Reviewed:   ? ?The following studies were reviewed today: ?Echo 2010-normal EF ? ?EKG:  EKG is ordered today.  The ekg ordered today demonstrates SB 58 otherwise normal ? ?Recent Labs: ?03/16/2021: ALT 17; BUN 25; Creatinine, Ser 1.37; Hemoglobin 15.3; Platelets 257.0; Potassium 4.2; Sodium 140; TSH 1.16  ?Recent Lipid Panel ?   ?Component Value Date/Time  ? CHOL 170 03/16/2021 1035  ? TRIG 183.0 (H) 03/16/2021 1035  ?  HDL 47.70 03/16/2021 1035  ? CHOLHDL 4 03/16/2021 1035  ? VLDL 36.6 03/16/2021 1035  ? [place] 85 03/16/2021 1035  ? ? ? ?Risk Assessment/Calculations:   ? ? ?    ? ?   ? ?Physical Exam:   ? ?VS:  BP 120/70 (BP Location: Left Arm, Patient Position: Sitting)   Pulse (!) 58   Ht 5' 10.25" (1.784 m)   Wt 174 lb (78.9 kg)   SpO2 98%   BMI 24.79 kg/m?    ? ?Wt Readings from Last 3 Encounters:  ?04/25/21 174 lb (78.9 kg)  ?03/16/21 178 lb (80.7 kg)  ?03/08/21  181 lb (82.1 kg)  ?  ? ?GEN:  Well nourished, well developed in no acute distress ?HEENT: Normal ?NECK: No JVD; No carotid bruits ?LYMPHATICS: No lymphadenopathy ?CARDIAC: Bradycardic regular, no murmurs, no rubs, gallops ?RESPIRATORY:  Clear to auscultation without rales, wheezing or rhonchi  ?ABDOMEN: Soft, non-tender, non-distended ?MUSCULOSKELETAL:  No edema; No deformity  ?SKIN: Warm and dry ?NEUROLOGIC:  Alert and oriented x 3 ?PSYCHIATRIC:  Normal affect  ? ?ASSESSMENT:   ? ?1. Essential hypertension   ?2. Coronary artery disease due to lipid rich plaque   ?3. Polycythemia vera ([place])   ?4. Hyperlipidemia, unspecified hyperlipidemia type   ? ?PLAN:   ? ?In order of problems listed above: ? ?CAD (coronary artery disease) ?Previously nonobstructive coronary artery disease with 30% left main.  Overall doing very well without any anginal symptoms.  Continue with goal-directed medical therapy which includes statin and antihypertensive.  LDL goal less than 70. ? ?Polycythemia vera ?Dr. Marin Olp with hematology has been following closely.  Notes reviewed.  Unfortunately, he is rejected by the [facility] when trying to give blood.  Hemoglobin has been stable.  Lab work reviewed.  He is JAK2 negative ? ?Hyperlipidemia ?On pravastatin.  Uses good Rx.  40 mg of pravastatin.  No myalgias. ? ?Essential hypertension ?On amlodipine 10 mg.  Also on lisinopril 10 mg.  Overall doing well.  Well-controlled.  He is not on beta-blockers because of prior bradycardia. ?  ? ? ?   ? ? ?Medication Adjustments/Labs and Tests Ordered: ?Current medicines are reviewed at length with the patient today.  Concerns regarding medicines are outlined above.  ?Orders Placed This Encounter  ?Procedu

## 2021-04-25 NOTE — Assessment & Plan Note (Signed)
Dr. Marin Olp with hematology has been following closely.  Notes reviewed.  Unfortunately, he is rejected by the [facility] when trying to give blood.  Hemoglobin has been stable.  Lab work reviewed.  He is JAK2 negative ?

## 2021-05-26 ENCOUNTER — Other Ambulatory Visit: Payer: Medicare HMO

## 2021-05-26 ENCOUNTER — Ambulatory Visit: Payer: Medicare HMO | Admitting: Hematology & Oncology

## 2021-06-10 ENCOUNTER — Encounter: Payer: Self-pay | Admitting: Adult Health

## 2021-06-14 ENCOUNTER — Encounter: Payer: Self-pay | Admitting: Adult Health

## 2021-06-14 ENCOUNTER — Ambulatory Visit (INDEPENDENT_AMBULATORY_CARE_PROVIDER_SITE_OTHER): Payer: Medicare HMO | Admitting: Adult Health

## 2021-06-14 VITALS — BP 130/70 | HR 60 | Temp 97.7°F | Ht 70.25 in | Wt 166.2 lb

## 2021-06-14 DIAGNOSIS — R7303 Prediabetes: Secondary | ICD-10-CM

## 2021-06-14 LAB — POCT GLYCOSYLATED HEMOGLOBIN (HGB A1C): Hemoglobin A1C: 5.9 % — AB (ref 4.0–5.6)

## 2021-06-14 NOTE — Progress Notes (Signed)
Subjective:    Patient ID: Alexander [name], male    DOB: [birth year], 81 y.o.   MRN: [id number]  HPI  81 year old male who  has a past medical history of Arthritis, CAD (coronary artery disease), Diverticulosis of colon, GERD (gastroesophageal reflux disease), GI bleed, Gout, Hematuria, microscopic, Hemorrhoids, Hyperglycemia, Hyperlipidemia, Hypertension, Lumbar back pain, Microalbuminuria, Overweight(278.02), Polycythemia rubra vera ([place]), and Tubular adenoma of colon (07/2012).  He presents to the office today for three month follow up regarding DM Type 2. During his CPE his A1c increased from baseline 6.2-6.8.  I had him work on lifestyle modifications before medication was added.  Today he reports that he has been exercising more and eating healthier and smaller portions. He is feeling much better overall.   Lab Results  Component Value Date   HGBA1C 6.8 (H) 03/16/2021   Wt Readings from Last 3 Encounters:  06/14/21 166 lb 3.2 oz (75.4 kg)  04/25/21 174 lb (78.9 kg)  03/16/21 178 lb (80.7 kg)    Review of Systems See HPI   Past Medical History:  Diagnosis Date   Arthritis    "minor; shoulders primarily" (05/20/2013)   CAD (coronary artery disease)    a. 05/2013 - Chest pain/unstable angina and dynamic EKG changes showing cath showing atherosclerotic coronary artery disease manifested as diffuse ectasia, normal EF.   Diverticulosis of colon    GERD (gastroesophageal reflux disease)    GI bleed    secondary to Aspirin   Gout    Hematuria, microscopic    Hemorrhoids    Hyperglycemia    Hyperlipidemia    Hypertension    Lumbar back pain    Microalbuminuria    Overweight(278.02)    Polycythemia rubra vera ([facility])    Tubular adenoma of colon 07/2012    Social History   Socioeconomic History   Marital status: Married    Spouse name: Not on file   Number of children: 3   Years of education: Not on file   Highest education level: 12th grade  Occupational History    Occupation: retired Lobbyist: RETIRED  Tobacco Use   Smoking status: Never   Smokeless tobacco: Never   Tobacco comments:    NEVER USED TOBACCO  Vaping Use   Vaping Use: Never used  Substance and Sexual Activity   Alcohol use: No    Alcohol/week: 0.0 standard drinks of alcohol   Drug use: No   Sexual activity: Yes    Partners: Female  Other Topics Concern   Not on file  Social History Narrative   Marriedfor 47 years    Daughter, Higher education careers adviser (pediatrician--lives in [place], [place]), one in [place] - Engineer, water. One in [place] - Chief Strategy Officer          Social Determinants of Health   Financial Resource Strain: Low Risk  (06/10/2021)   Overall Financial Resource Strain (CARDIA)    Difficulty of Paying Living Expenses: Not hard at all  Food Insecurity: No Food Insecurity (06/10/2021)   Hunger Vital Sign    Worried About Running Out of Food in the Last Year: Never true    [place] in the Last Year: Never true  Transportation Needs: No Transportation Needs (06/10/2021)   PRAPARE - Hydrologist (Medical): No    Lack of Transportation (Non-Medical): No  Physical Activity: Sufficiently Active (06/10/2021)   Exercise Vital Sign    Days of Exercise per Week: 5 days  Minutes of Exercise per Session: 60 min  Stress: No Stress Concern Present (06/10/2021)   [place]    Feeling of Stress : Only a little  Social Connections: Socially Integrated (06/10/2021)   Social Connection and Isolation Panel [NHANES]    Frequency of Communication with Friends and Family: More than three times a week    Frequency of Social Gatherings with Friends and Family: More than three times a week    Attends Religious Services: More than 4 times per year    Active Member of [employer] or Organizations: Yes    Attends Music therapist: More than 4 times per year    Marital Status: Married  Human resources officer  Violence: Not At Risk (09/08/2020)   Humiliation, Afraid, Rape, and Kick questionnaire    Fear of Current or Ex-Partner: No    Emotionally Abused: No    Physically Abused: No    Sexually Abused: No    Past Surgical History:  Procedure Laterality Date   CARDIAC CATHETERIZATION  05/20/2013   CATARACT EXTRACTION W/ INTRAOCULAR LENS  IMPLANT, BILATERAL Bilateral 2008   CYSTECTOMY  ~ 2000   " SEBACEOUS cyst removed from between shoulder blades"   EXCISIONAL HEMORRHOIDECTOMY  1970's   INGUINAL HERNIA REPAIR Right ~ 2010   [place] N/A 05/20/2013   Procedure: LEFT HEART CATHETERIZATION WITH CORONARY ANGIOGRAM;  Surgeon: Peter M Martinique, MD;  Location: [facility] CATH LAB;  Service: Cardiovascular;  Laterality: N/A;   TONSILLECTOMY AND ADENOIDECTOMY  67's   VASECTOMY      Family History  Problem Relation Age of Onset   Colon cancer Mother        died at 19, colorectal   Diabetes Mother    Heart disease Father 28       MI   Diabetes Father    Stomach cancer Maternal Grandfather    Cancer Maternal Uncle     Allergies  Allergen Reactions   Metformin And Related Diarrhea   Metformin Hcl Other (See Comments)    Current Outpatient Medications on File Prior to Visit  Medication Sig Dispense Refill   ACCU-CHEK SOFTCLIX LANCETS lancets Use to test blood glucose once daily 100 each 3   allopurinol (ZYLOPRIM) 100 MG tablet TAKE ONE TABLET BY MOUTH DAILY 365 tablet 0   amLODipine (NORVASC) 10 MG tablet TAKE ONE TABLET BY MOUTH DAILY 90 tablet 3   aspirin EC 81 MG tablet Take 1 tablet (81 mg total) by mouth daily. 90 tablet 3   Blood Glucose Monitoring Suppl (ACCU-CHEK AVIVA PLUS) w/Device KIT Use to test blood glucose once daily 1 kit 0   Coenzyme Q10 300 MG CAPS Take 1 capsule by mouth daily.      glucose blood (ACCU-CHEK AVIVA) test strip Use to test blood glucose once daily 100 each 3   hydrocortisone (ANUSOL-HC) 25 MG suppository Place 25 mg rectally  daily as needed for hemorrhoids or anal itching.     lisinopril (ZESTRIL) 10 MG tablet TAKE ONE TABLET BY MOUTH DAILY 90 tablet 3   pantoprazole (PROTONIX) 40 MG tablet TAKE ONE TABLET BY MOUTH DAILY 365 tablet 0   pravastatin (PRAVACHOL) 40 MG tablet TAKE ONE TABLET BY MOUTH DAILY 365 tablet 0   PREVIDENT 5000 ENAMEL PROTECT 1.1-5 % PSTE See admin instructions.     PROCTO-MED HC 2.5 % rectal cream APPLY 1 CREAM RECTALLY TWICE DAILY 90 g 3   No current  facility-administered medications on file prior to visit.    BP 130/70 (BP Location: Right Arm, Patient Position: Sitting, Cuff Size: Normal)   Pulse 60   Temp 97.7 F (36.5 C) (Oral)   Ht 5' 10.25" (1.784 m)   Wt 166 lb 3.2 oz (75.4 kg)   SpO2 98%   BMI 23.68 kg/m    Objective:   Physical Exam Vitals and nursing note reviewed.  Constitutional:      Appearance: Normal appearance.  Cardiovascular:     Rate and Rhythm: Normal rate and regular rhythm.     Pulses: Normal pulses.     Heart sounds: Normal heart sounds.  Pulmonary:     Effort: Pulmonary effort is normal.     Breath sounds: Normal breath sounds.  Musculoskeletal:        General: Normal range of motion.  Skin:    General: Skin is warm and dry.     Capillary Refill: Capillary refill takes less than 2 seconds.  Neurological:     General: No focal deficit present.     Mental Status: He is alert and oriented to person, place, and time.  Psychiatric:        Mood and Affect: Mood normal.        Behavior: Behavior normal.        Thought Content: Thought content normal.       Assessment & Plan:  1. Prediabetes  - POC HgB A1c- 5.8  - Continue with lifestyle modifications  - Will recheck in 6 months   [name], NP

## 2021-06-16 ENCOUNTER — Other Ambulatory Visit: Payer: Self-pay | Admitting: *Deleted

## 2021-06-16 DIAGNOSIS — D45 Polycythemia vera: Secondary | ICD-10-CM

## 2021-06-17 ENCOUNTER — Encounter: Payer: Self-pay | Admitting: Hematology & Oncology

## 2021-06-17 ENCOUNTER — Inpatient Hospital Stay (HOSPITAL_BASED_OUTPATIENT_CLINIC_OR_DEPARTMENT_OTHER): Payer: Medicare HMO | Admitting: Hematology & Oncology

## 2021-06-17 ENCOUNTER — Other Ambulatory Visit: Payer: Self-pay | Admitting: Lab

## 2021-06-17 ENCOUNTER — Other Ambulatory Visit: Payer: Self-pay

## 2021-06-17 ENCOUNTER — Inpatient Hospital Stay: Payer: Medicare HMO

## 2021-06-17 ENCOUNTER — Encounter: Payer: Self-pay | Admitting: Adult Health

## 2021-06-17 ENCOUNTER — Inpatient Hospital Stay: Payer: Medicare HMO | Admitting: Hematology & Oncology

## 2021-06-17 ENCOUNTER — Inpatient Hospital Stay: Payer: Medicare HMO | Attending: Hematology & Oncology

## 2021-06-17 VITALS — BP 114/70 | HR 67 | Temp 98.3°F | Resp 18 | Wt 168.0 lb

## 2021-06-17 DIAGNOSIS — D45 Polycythemia vera: Secondary | ICD-10-CM | POA: Diagnosis not present

## 2021-06-17 DIAGNOSIS — Z79899 Other long term (current) drug therapy: Secondary | ICD-10-CM | POA: Insufficient documentation

## 2021-06-17 DIAGNOSIS — D751 Secondary polycythemia: Secondary | ICD-10-CM | POA: Diagnosis present

## 2021-06-17 DIAGNOSIS — Z7982 Long term (current) use of aspirin: Secondary | ICD-10-CM | POA: Insufficient documentation

## 2021-06-17 LAB — CMP (CANCER CENTER ONLY)
ALT: 13 U/L (ref 0–44)
AST: 14 U/L — ABNORMAL LOW (ref 15–41)
Albumin: 4.5 g/dL (ref 3.5–5.0)
Alkaline Phosphatase: 75 U/L (ref 38–126)
Anion gap: 8 (ref 5–15)
BUN: 27 mg/dL — ABNORMAL HIGH (ref 8–23)
CO2: 26 mmol/L (ref 22–32)
Calcium: 9.8 mg/dL (ref 8.9–10.3)
Chloride: 105 mmol/L (ref 98–111)
Creatinine: 1.01 mg/dL (ref 0.61–1.24)
GFR, Estimated: >60 mL/min (ref >60)
Glucose, Bld: 176 mg/dL — ABNORMAL HIGH (ref 70–99)
Potassium: 4.1 mmol/L (ref 3.5–5.1)
Sodium: 139 mmol/L (ref 135–145)
Total Bilirubin: 0.6 mg/dL (ref 0.3–1.2)
Total Protein: 7.5 g/dL (ref 6.5–8.1)

## 2021-06-17 LAB — CBC WITH DIFFERENTIAL (CANCER CENTER ONLY)
Abs Immature Granulocytes: 0.08 10*3/uL — ABNORMAL HIGH (ref 0.00–0.07)
Basophils Absolute: 0.1 10*3/uL (ref 0.0–0.1)
Basophils Relative: 1 %
Eosinophils Absolute: 0.2 10*3/uL (ref 0.0–0.5)
Eosinophils Relative: 2 %
HCT: 45.8 % (ref 39.0–52.0)
Hemoglobin: 14.9 g/dL (ref 13.0–17.0)
Immature Granulocytes: 1 %
Lymphocytes Relative: 17 %
Lymphs Abs: 1.9 10*3/uL (ref 0.7–4.0)
MCH: 30.3 pg (ref 26.0–34.0)
MCHC: 32.5 g/dL (ref 30.0–36.0)
MCV: 93.1 fL (ref 80.0–100.0)
Monocytes Absolute: 1 10*3/uL (ref 0.1–1.0)
Monocytes Relative: 10 %
Neutro Abs: 7.6 10*3/uL (ref 1.7–7.7)
Neutrophils Relative %: 69 %
Platelet Count: 248 10*3/uL (ref 150–400)
RBC: 4.92 MIL/uL (ref 4.22–5.81)
RDW: 15 % (ref 11.5–15.5)
WBC Count: 10.9 10*3/uL — ABNORMAL HIGH (ref 4.0–10.5)
nRBC: 0 % (ref 0.0–0.2)

## 2021-06-17 LAB — IRON AND IRON BINDING CAPACITY (CC-WL,HP ONLY)
Iron: 53 ug/dL (ref 45–182)
Saturation Ratios: 15 % — ABNORMAL LOW (ref 17.9–39.5)
TIBC: 360 ug/dL (ref 250–450)
UIBC: 307 ug/dL (ref 117–376)

## 2021-06-17 LAB — FERRITIN: Ferritin: 35 ng/mL (ref 24–336)

## 2021-06-17 LAB — RETICULOCYTES
Immature Retic Fract: 4.3 % (ref 2.3–15.9)
RBC.: 4.83 MIL/uL (ref 4.22–5.81)
Retic Count, Absolute: 45.4 10*3/uL (ref 19.0–186.0)
Retic Ct Pct: 0.9 % (ref 0.4–3.1)

## 2021-06-17 NOTE — Progress Notes (Signed)
Alexander [name] presents today for phlebotomy per MD orders. Phlebotomy procedure started at 12:40 PM and ended at 12:50 AM. 519 grams removed via 18 gauge needle to right AC.  Patient observed for 30 minutes after procedure without any incident. Patient tolerated procedure well and declined replacement fluids after procedure.  Patient understands to call if he has any questions or concerns post discharge.

## 2021-06-17 NOTE — Progress Notes (Signed)
Hematology and Oncology Follow Up Visit  Alexander [name] [id number] Feb 03, 1940 81 y.o. 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 is in for a followup.  Sounds like he will have a busy summer.  He has grandchildren coming over.  He also now has 1/6 grade grandchild.  I think she was born in [place].  He is active.  He is swimming.  He is really losing some weight.  He is trying to watch his intake of carbohydrates.  Apparently, his hemoglobin A1c was up a little bit.  It is now back down to less than 6.  When we last saw him back in March, his ferritin was 55 with an iron saturation of 38%.  He has had no cardiac issues.  I think he sees a cardiologist once a year.  He has had no issues with cough.  He has had no shortness of breath.  He has had no leg swelling.  He has had no rashes.  Overall, I would say his performance status is probably [name].    Medications:  Current Outpatient Medications:    ACCU-CHEK SOFTCLIX LANCETS lancets, Use to test blood glucose once daily, Disp: 100 each, Rfl: 3   allopurinol (ZYLOPRIM) 100 MG tablet, TAKE ONE TABLET BY MOUTH DAILY, Disp: 365 tablet, Rfl: 0   amLODipine (NORVASC) 10 MG tablet, TAKE ONE TABLET BY MOUTH DAILY, Disp: 90 tablet, Rfl: 3   aspirin EC 81 MG tablet, Take 1 tablet (81 mg total) by mouth daily., Disp: 90 tablet, Rfl: 3   Blood Glucose Monitoring Suppl (ACCU-CHEK AVIVA PLUS) w/Device KIT, Use to test blood glucose once daily, Disp: 1 kit, Rfl: 0   Coenzyme Q10 300 MG CAPS, Take 1 capsule by mouth daily. , Disp: , Rfl:    glucose blood (ACCU-CHEK AVIVA) test strip, Use to test blood glucose once daily, Disp: 100 each, Rfl: 3   hydrocortisone (ANUSOL-HC) 25 MG suppository, Place 25 mg rectally daily as needed for hemorrhoids or anal itching.,  Disp: , Rfl:    lisinopril (ZESTRIL) 10 MG tablet, TAKE ONE TABLET BY MOUTH DAILY, Disp: 90 tablet, Rfl: 3   pantoprazole (PROTONIX) 40 MG tablet, TAKE ONE TABLET BY MOUTH DAILY, Disp: 365 tablet, Rfl: 0   pravastatin (PRAVACHOL) 40 MG tablet, TAKE ONE TABLET BY MOUTH DAILY, Disp: 365 tablet, Rfl: 0   PREVIDENT 5000 ENAMEL PROTECT 1.1-5 % PSTE, See admin instructions., Disp: , Rfl:    PROCTO-MED HC 2.5 % rectal cream, APPLY 1 CREAM RECTALLY TWICE DAILY, Disp: 90 g, Rfl: 3  Allergies:  Allergies  Allergen Reactions   Metformin And Related Diarrhea   Metformin Hcl Other (See Comments)    Past Medical History, Surgical history, Social history, and Family History were reviewed and updated.  Review of Systems: Review of Systems  Constitutional: Negative.   HENT: Negative.    Eyes: Negative.   Respiratory: Negative.    Cardiovascular: Negative.   Gastrointestinal: Negative.   Genitourinary: Negative.   Musculoskeletal: Negative.   Skin: Negative.   Neurological: Negative.   Endo/Heme/Allergies: Negative.   Psychiatric/Behavioral: Negative.      Physical Exam:  weight is 168 lb (76.2 kg). His oral temperature is 98.3 F (36.8 C). His blood pressure is 114/70 and his pulse is 67. His respiration is 18 and oxygen saturation  is 100%.   Physical Exam Vitals reviewed.  HENT:     Head: Normocephalic and atraumatic.  Eyes:     Pupils: Pupils are equal, round, and reactive to light.  Cardiovascular:     Rate and Rhythm: Normal rate and regular rhythm.     Heart sounds: Normal heart sounds.  Pulmonary:     Effort: Pulmonary effort is normal.     Breath sounds: Normal breath sounds.  Abdominal:     General: Bowel sounds are normal.     Palpations: Abdomen is soft.  Musculoskeletal:        General: No tenderness or deformity. Normal range of motion.     Cervical back: Normal range of motion.  Lymphadenopathy:     Cervical: No cervical adenopathy.  Skin:    General: Skin is warm  and dry.     Findings: No erythema or rash.  Neurological:     Mental Status: He is alert and oriented to person, place, and time.  Psychiatric:        Behavior: Behavior normal.        Thought Content: Thought content normal.        Judgment: Judgment normal.      Lab Results  Component Value Date   WBC 10.9 (H) 06/17/2021   HGB 14.9 06/17/2021   HCT 45.8 06/17/2021   MCV 93.1 06/17/2021   PLT 248 06/17/2021     Chemistry      Component Value Date/Time   NA 139 06/17/2021 1124   NA 144 11/29/2016 1054   NA 139 12/29/2015 0954   K 4.1 06/17/2021 1124   K 4.2 11/29/2016 1054   K 4.1 12/29/2015 0954   CL 105 06/17/2021 1124   CL 102 11/29/2016 1054   CO2 26 06/17/2021 1124   CO2 30 11/29/2016 1054   CO2 26 12/29/2015 0954   BUN 27 (H) 06/17/2021 1124   BUN 22 11/29/2016 1054   BUN 20.0 12/29/2015 0954   CREATININE 1.01 06/17/2021 1124   CREATININE 1.2 11/29/2016 1054   CREATININE 1.3 12/29/2015 0954      Component Value Date/Time   CALCIUM 9.8 06/17/2021 1124   CALCIUM 9.3 11/29/2016 1054   CALCIUM 9.5 12/29/2015 0954   ALKPHOS 75 06/17/2021 1124   ALKPHOS 92 (H) 11/29/2016 1054   ALKPHOS 98 12/29/2015 0954   AST 14 (L) 06/17/2021 1124   AST 16 12/29/2015 0954   ALT 13 06/17/2021 1124   ALT 20 11/29/2016 1054   ALT 13 12/29/2015 0954   BILITOT 0.6 06/17/2021 1124   BILITOT 0.71 12/29/2015 0954      Impression and Plan: Alexander [name] is 81 year old gentleman. He has secondary polycythemia/erythrocytosis. He is JAK2 negative.   He phlebotomized today.  I think this was a good idea.  We will now get him through the Summer.  I will see him back after Labor Day.  I know he will be very active with his swimming over the summer.      [name], MD 6/16/202312:50 PM

## 2021-06-17 NOTE — Telephone Encounter (Signed)
Please advise 

## 2021-08-11 ENCOUNTER — Telehealth: Payer: Self-pay | Admitting: Pharmacist

## 2021-08-11 NOTE — Chronic Care Management (AMB) (Signed)
Chronic Care Management Pharmacy Assistant   Name: Alexander [name]  MRN: [id number] DOB: November 02, 1940  Reason for Encounter: Disease State   Conditions to be addressed/monitored: General Assessment  Recent office visits:  06/14/21 Alexander Peng, NP - Patient presented for Prediabetes. No medication changes.  03/16/21 Alexander Peng, NP - Patient presented for Routine general medical examination at a health care facility and other concerns. Stopped Methylprednisolone 4 mg.   Recent consult visits:  06/17/21 Patient presented to [facility] at [facility] for [facility].  06/17/21 [name], MD (Oncology) - Patient presented for Polycythemia vera. No medication changes.  04/25/21 Jerline Pain, MD (Cardiology) - Patient presented for Essential hypertension and other concerns.No medication changes.  03/08/21 [name], MD (Oncology) - Patient presented for Polycythemia vera. No medication changes.  Hospital visits:  None in previous 6 months  Medications: Outpatient Encounter Medications as of 08/11/2021  Medication Sig   ACCU-CHEK SOFTCLIX LANCETS lancets Use to test blood glucose once daily   allopurinol (ZYLOPRIM) 100 MG tablet TAKE ONE TABLET BY MOUTH DAILY   amLODipine (NORVASC) 10 MG tablet TAKE ONE TABLET BY MOUTH DAILY   aspirin EC 81 MG tablet Take 1 tablet (81 mg total) by mouth daily.   Blood Glucose Monitoring Suppl (ACCU-CHEK AVIVA PLUS) w/Device KIT Use to test blood glucose once daily   Coenzyme Q10 300 MG CAPS Take 1 capsule by mouth daily.    glucose blood (ACCU-CHEK AVIVA) test strip Use to test blood glucose once daily   hydrocortisone (ANUSOL-HC) 25 MG suppository Place 25 mg rectally daily as needed for hemorrhoids or anal itching.   lisinopril (ZESTRIL) 10 MG tablet TAKE ONE TABLET BY MOUTH DAILY   pantoprazole (PROTONIX) 40 MG tablet TAKE ONE TABLET BY MOUTH DAILY   pravastatin (PRAVACHOL) 40 MG tablet TAKE ONE TABLET BY MOUTH  DAILY   PREVIDENT 5000 ENAMEL PROTECT 1.1-5 % PSTE See admin instructions.   PROCTO-MED HC 2.5 % rectal cream APPLY 1 CREAM RECTALLY TWICE DAILY   No facility-administered encounter medications on file as of 08/11/2021.   Contacted [name] for [employer] Review Call  Adherence Review:  Does the Clinical Pharmacist Assistant have access to adherence rates? Yes Adherence rates for STAR metric medications  Lisinopril 10 mg - Last filled  07/07/21 90 DS at [facility] Lisinopril 10 mg - Last filled  04/07/21 90 DS at [name] Pravastatin 40 mg - Last filled 03/07/21 365 DS at [name]  Does the patient have >5 day gap between last estimated fill dates for any of the above medications or other medication gaps? No    Disease State Questions:  Able to connect with Patient? Yes Did patient have any problems with their health recently? Yes Patient reports his back recently Have you had any admissions or emergency room visits or worsening of your condition(s) since last visit? No  Have you had any visits with new specialists or providers since your last visit? No  Have you had any new health care problem(s) since your last visit? No  Have you run out of any of your medications since you last spoke with clinical pharmacist? No  Are there any medications you are not taking as prescribed? No  Are you having any issues or side effects with your medications? No  Do you have any other health concerns or questions you want to discuss with your Clinical Pharmacist before your next visit? No  Are there  any health concerns that you feel we can do a better job addressing? No  Are you having any problems with any of the following since the last visit: (select all that apply)  None  12. Any falls since last visit? No  13. Any increased or uncontrolled pain since last visit? No    Additional Details? Patient reports he has seen [facility] for his back and it has greatly improved, he  reports no issues or concerns at this time.   Care Gaps: COVID Booster - Overdue Flu Vaccine - Overdue Colonoscopy - Postponed BP- 114/70 06/17/21 AWV-  9/22 CCM- Declined at this time  Star Rating Drugs: Lisinopril 10 mg - Last filled  07/07/21 90 DS at [facility] Pravastatin 40 mg - Last filled 03/07/21 365 DS at [facility] Pharmacist Assistant [phone]

## 2021-08-15 ENCOUNTER — Encounter: Payer: Self-pay | Admitting: Adult Health

## 2021-08-16 ENCOUNTER — Encounter: Payer: Self-pay | Admitting: Adult Health

## 2021-08-16 ENCOUNTER — Ambulatory Visit (INDEPENDENT_AMBULATORY_CARE_PROVIDER_SITE_OTHER): Payer: Medicare HMO | Admitting: Adult Health

## 2021-08-16 VITALS — BP 120/68 | HR 55 | Temp 98.1°F | Ht 70.5 in | Wt 166.0 lb

## 2021-08-16 DIAGNOSIS — M545 Low back pain, unspecified: Secondary | ICD-10-CM | POA: Diagnosis not present

## 2021-08-16 MED ORDER — METHYLPREDNISOLONE 4 MG PO TBPK: ORAL_TABLET | ORAL | 0 refills | Status: DC

## 2021-08-16 NOTE — Progress Notes (Signed)
Subjective:    Patient ID: Alexander [name], male    DOB: March 05, 1940, 81 y.o.   MRN: [id number]  HPI 81 year old male who  has a past medical history of Arthritis, CAD (coronary artery disease), Diverticulosis of colon, GERD (gastroesophageal reflux disease), GI bleed, Gout, Hematuria, microscopic, Hemorrhoids, Hyperglycemia, Hyperlipidemia, Hypertension, Lumbar back pain, Microalbuminuria, Overweight(278.02), Polycythemia rubra vera ([place]), and Tubular adenoma of colon (07/2012).  He presents to the office today for an acute issue of low back pain for the last few days.  Reports that the back pain radiates across his lower back.  Has no pain down his legs.  Pain is worse with bending at the waist.  He believes that he pulled a muscle while swimming.No issues with bowel or bladder.    Review of Systems See HPI   Past Medical History:  Diagnosis Date   Arthritis    "minor; shoulders primarily" (05/20/2013)   CAD (coronary artery disease)    a. 05/2013 - Chest pain/unstable angina and dynamic EKG changes showing cath showing atherosclerotic coronary artery disease manifested as diffuse ectasia, normal EF.   Diverticulosis of colon    GERD (gastroesophageal reflux disease)    GI bleed    secondary to Aspirin   Gout    Hematuria, microscopic    Hemorrhoids    Hyperglycemia    Hyperlipidemia    Hypertension    Lumbar back pain    Microalbuminuria    Overweight(278.02)    Polycythemia rubra vera (HCC)    Tubular adenoma of colon 07/2012    Social History   Socioeconomic History   Marital status: Married    Spouse name: Not on file   Number of children: 3   Years of education: Not on file   Highest education level: 12th grade  Occupational History   Occupation: retired Lobbyist: RETIRED  Tobacco Use   Smoking status: Never   Smokeless tobacco: Never   Tobacco comments:    NEVER USED TOBACCO  Vaping Use   Vaping Use: Never used  Substance and Sexual Activity    Alcohol use: No    Alcohol/week: 0.0 standard drinks of alcohol   Drug use: No   Sexual activity: Yes    Partners: Female  Other Topics Concern   Not on file  Social History Narrative   Marriedfor 94 years    Daughter, Higher education careers adviser (pediatrician--lives in [place], [place]), one in [place] - Engineer, water. One in [place] - Chief Strategy Officer          Social Determinants of Health   Financial Resource Strain: Low Risk  (06/10/2021)   Overall Financial Resource Strain (CARDIA)    Difficulty of Paying Living Expenses: Not hard at all  Food Insecurity: No Food Insecurity (06/10/2021)   Hunger Vital Sign    Worried About Running Out of Food in the Last Year: Never true    [place] in the Last Year: Never true  Transportation Needs: No Transportation Needs (06/10/2021)   PRAPARE - Hydrologist (Medical): No    Lack of Transportation (Non-Medical): No  Physical Activity: Sufficiently Active (06/10/2021)   Exercise Vital Sign    Days of Exercise per Week: 5 days    Minutes of Exercise per Session: 60 min  Stress: No Stress Concern Present (06/10/2021)   [place]    Feeling of Stress : Only a little  Social Connections: Socially  Integrated (06/10/2021)   Social Connection and Isolation Panel [NHANES]    Frequency of Communication with Friends and Family: More than three times a week    Frequency of Social Gatherings with Friends and Family: More than three times a week    Attends Religious Services: More than 4 times per year    Active Member of [employer] or Organizations: Yes    Attends Music therapist: More than 4 times per year    Marital Status: Married  Human resources officer Violence: Not At Risk (09/08/2020)   Humiliation, Afraid, Rape, and Kick questionnaire    Fear of Current or Ex-Partner: No    Emotionally Abused: No    Physically Abused: No    Sexually Abused: No    Past Surgical History:   Procedure Laterality Date   CARDIAC CATHETERIZATION  05/20/2013   CATARACT EXTRACTION W/ INTRAOCULAR LENS  IMPLANT, BILATERAL Bilateral 2008   CYSTECTOMY  ~ 2000   " SEBACEOUS cyst removed from between shoulder blades"   EXCISIONAL HEMORRHOIDECTOMY  1970's   INGUINAL HERNIA REPAIR Right ~ 2010   [place] N/A 05/20/2013   Procedure: LEFT HEART CATHETERIZATION WITH CORONARY ANGIOGRAM;  Surgeon: Peter M Martinique, MD;  Location: [facility] CATH LAB;  Service: Cardiovascular;  Laterality: N/A;   TONSILLECTOMY AND ADENOIDECTOMY  2's   VASECTOMY      Family History  Problem Relation Age of Onset   Colon cancer Mother        died at 19, colorectal   Diabetes Mother    Heart disease Father 74       MI   Diabetes Father    Stomach cancer Maternal Grandfather    Cancer Maternal Uncle     Allergies  Allergen Reactions   Metformin And Related Diarrhea   Metformin Hcl Other (See Comments)    Current Outpatient Medications on File Prior to Visit  Medication Sig Dispense Refill   ACCU-CHEK SOFTCLIX LANCETS lancets Use to test blood glucose once daily 100 each 3   allopurinol (ZYLOPRIM) 100 MG tablet TAKE ONE TABLET BY MOUTH DAILY 365 tablet 0   amLODipine (NORVASC) 10 MG tablet TAKE ONE TABLET BY MOUTH DAILY 90 tablet 3   aspirin EC 81 MG tablet Take 1 tablet (81 mg total) by mouth daily. 90 tablet 3   Blood Glucose Monitoring Suppl (ACCU-CHEK AVIVA PLUS) w/Device KIT Use to test blood glucose once daily 1 kit 0   Coenzyme Q10 300 MG CAPS Take 1 capsule by mouth daily.      glucose blood (ACCU-CHEK AVIVA) test strip Use to test blood glucose once daily 100 each 3   hydrocortisone (ANUSOL-HC) 25 MG suppository Place 25 mg rectally daily as needed for hemorrhoids or anal itching.     lisinopril (ZESTRIL) 10 MG tablet TAKE ONE TABLET BY MOUTH DAILY 90 tablet 3   pantoprazole (PROTONIX) 40 MG tablet TAKE ONE TABLET BY MOUTH DAILY 365 tablet 0   pravastatin  (PRAVACHOL) 40 MG tablet TAKE ONE TABLET BY MOUTH DAILY 365 tablet 0   PREVIDENT 5000 ENAMEL PROTECT 1.1-5 % PSTE See admin instructions.     PROCTO-MED HC 2.5 % rectal cream APPLY 1 CREAM RECTALLY TWICE DAILY 90 g 3   No current facility-administered medications on file prior to visit.    BP 120/68   Pulse (!) 55   Temp 98.1 F (36.7 C) (Oral)   Ht 5' 10.5" (1.791 m)   Wt 166 lb (75.3 kg)  SpO2 98%   BMI 23.48 kg/m       Objective:   Physical Exam Vitals and nursing note reviewed.  Constitutional:      Appearance: Normal appearance.  Pulmonary:     Breath sounds: Normal breath sounds.  Musculoskeletal:     Lumbar back: Spasms present. No swelling, deformity, tenderness or bony tenderness. Decreased range of motion.  Skin:    General: Skin is warm and dry.  Neurological:     General: No focal deficit present.     Mental Status: He is alert and oriented to person, place, and time.  Psychiatric:        Mood and Affect: Mood normal.        Behavior: Behavior normal.        Thought Content: Thought content normal.       Assessment & Plan:  1. Acute bilateral low back pain without sciatica - low back pain likely from muscle strain. He has done well with medrol dose pack in the past. Will treat with this again.  - Follow up in 1 week if no improvement  - methylPREDNISolone (MEDROL DOSEPAK) 4 MG TBPK tablet; Take as directed  Dispense: 21 tablet; Refill: 0  [name], NP

## 2021-09-07 ENCOUNTER — Telehealth: Payer: Self-pay | Admitting: Adult Health

## 2021-09-07 NOTE — Telephone Encounter (Signed)
Left message for patient to call back and schedule Medicare Annual Wellness Visit (AWV) either virtually or in office. Left  my Alexander [name] number [phone]   Last AWV 09/08/20 ; please schedule at anytime with [facility] Nurse Health Advisor 1 or 2

## 2021-09-09 ENCOUNTER — Ambulatory Visit (INDEPENDENT_AMBULATORY_CARE_PROVIDER_SITE_OTHER): Payer: Medicare HMO

## 2021-09-09 VITALS — BP 122/62 | HR 69 | Temp 97.6°F | Ht 70.5 in | Wt 165.0 lb

## 2021-09-09 DIAGNOSIS — Z0000 Encounter for general adult medical examination without abnormal findings: Secondary | ICD-10-CM | POA: Diagnosis not present

## 2021-09-09 NOTE — Patient Instructions (Addendum)
[name] , Thank you for taking time to come for your Medicare Wellness Visit. I appreciate your ongoing commitment to your health goals. Please review the following plan we discussed and let me know if I can assist you in the future.   Screening recommendations/referrals: Colonoscopy: No longer required Recommended yearly ophthalmology/optometry visit for glaucoma screening and checkup Recommended yearly dental visit for hygiene and checkup  Vaccinations: Influenza vaccine: Up to date Pneumococcal vaccine: Up to date Tdap vaccine: Up to date Shingles vaccine: Done   Covid-19: Done  Advanced directives: Please bring a copy of your health care power of attorney and living will to the office to be added to your chart at your convenience.   Conditions/risks identified: None  Next appointment: Follow up in one year for your annual wellness visit.    Preventive Care 81 Years and Older, Male  Preventive care refers to lifestyle choices and visits with your health care provider that can promote health and wellness. What does preventive care include? A yearly physical exam. This is also called an annual well check. Dental exams once or twice a year. Routine eye exams. Ask your health care provider how often you should have your eyes checked. Personal lifestyle choices, including: Daily care of your teeth and gums. Regular physical activity. Eating a healthy diet. Avoiding tobacco and drug use. Limiting alcohol use. Practicing safe sex. Taking low doses of aspirin every day. Taking vitamin and mineral supplements as recommended by your health care provider. What happens during an annual well check? The services and screenings done by your health care provider during your annual well check will depend on your age, overall health, lifestyle risk factors, and family history of disease. Counseling  Your health care provider may ask you questions about your: Alcohol use. Tobacco  use. Drug use. Emotional well-being. Home and relationship well-being. Sexual activity. Eating habits. History of falls. Memory and ability to understand (cognition). Work and work Statistician. Screening  You may have the following tests or measurements: Height, weight, and BMI. Blood pressure. Lipid and cholesterol levels. These may be checked every 5 years, or more frequently if you are over 81 years old. Skin check. Lung cancer screening. You may have this screening every year starting at age 81 if you have a 30-pack-year history of smoking and currently smoke or have quit within the past 15 years. Fecal occult blood test (FOBT) of the stool. You may have this test every year starting at age 81. Flexible sigmoidoscopy or colonoscopy. You may have a sigmoidoscopy every 5 years or a colonoscopy every 10 years starting at age 81. Prostate cancer screening. Recommendations will vary depending on your family history and other risks. Hepatitis C blood test. Hepatitis B blood test. Sexually transmitted disease (STD) testing. Diabetes screening. This is done by checking your blood sugar (glucose) after you have not eaten for a while (fasting). You may have this done every 1-3 years. Abdominal aortic aneurysm (AAA) screening. You may need this if you are a current or former smoker. Osteoporosis. You may be screened starting at age 81 if you are at high risk. Talk with your health care provider about your test results, treatment options, and if necessary, the need for more tests. Vaccines  Your health care provider may recommend certain vaccines, such as: Influenza vaccine. This is recommended every year. Tetanus, diphtheria, and acellular pertussis (Tdap, Td) vaccine. You may need a Td booster every 10 years. Zoster vaccine. You may need this after age  81. Pneumococcal 13-valent conjugate (PCV13) vaccine. One dose is recommended after age 81. Pneumococcal polysaccharide (PPSV23) vaccine.  One dose is recommended after age 81. Talk to your health care provider about which screenings and vaccines you need and how often you need them. This information is not intended to replace advice given to you by your health care provider. Make sure you discuss any questions you have with your health care provider. Document Released: 01/15/2015 Document Revised: 09/08/2015 Document Reviewed: 10/20/2014 Elsevier Interactive Patient Education  2017 [place] Prevention in the Home Falls can cause injuries. They can happen to people of all ages. There are many things you can do to make your home safe and to help prevent falls. What can I do on the outside of my home? Regularly fix the edges of walkways and driveways and fix any cracks. Remove anything that might make you trip as you walk through a door, such as a raised step or threshold. Trim any bushes or trees on the path to your home. Use bright outdoor lighting. Clear any walking paths of anything that might make someone trip, such as rocks or tools. Regularly check to see if handrails are loose or broken. Make sure that both sides of any steps have handrails. Any raised decks and porches should have guardrails on the edges. Have any leaves, snow, or ice cleared regularly. Use sand or salt on walking paths during winter. Clean up any spills in your garage right away. This includes oil or grease spills. What can I do in the bathroom? Use night lights. Install grab bars by the toilet and in the tub and shower. Do not use towel bars as grab bars. Use non-skid mats or decals in the tub or shower. If you need to sit down in the shower, use a plastic, non-slip stool. Keep the floor dry. Clean up any water that spills on the floor as soon as it happens. Remove soap buildup in the tub or shower regularly. Attach bath mats securely with double-sided non-slip rug tape. Do not have throw rugs and other things on the floor that can make  you trip. What can I do in the bedroom? Use night lights. Make sure that you have a light by your bed that is easy to reach. Do not use any sheets or blankets that are too big for your bed. They should not hang down onto the floor. Have a firm chair that has side arms. You can use this for support while you get dressed. Do not have throw rugs and other things on the floor that can make you trip. What can I do in the kitchen? Clean up any spills right away. Avoid walking on wet floors. Keep items that you use a lot in easy-to-reach places. If you need to reach something above you, use a strong step stool that has a grab bar. Keep electrical cords out of the way. Do not use floor polish or wax that makes floors slippery. If you must use wax, use non-skid floor wax. Do not have throw rugs and other things on the floor that can make you trip. What can I do with my stairs? Do not leave any items on the stairs. Make sure that there are handrails on both sides of the stairs and use them. Fix handrails that are broken or loose. Make sure that handrails are as long as the stairways. Check any carpeting to make sure that it is firmly attached to the stairs. Fix  any carpet that is loose or worn. Avoid having throw rugs at the top or bottom of the stairs. If you do have throw rugs, attach them to the floor with carpet tape. Make sure that you have a light switch at the top of the stairs and the bottom of the stairs. If you do not have them, ask someone to add them for you. What else can I do to help prevent falls? Wear shoes that: Do not have high heels. Have rubber bottoms. Are comfortable and fit you well. Are closed at the toe. Do not wear sandals. If you use a stepladder: Make sure that it is fully opened. Do not climb a closed stepladder. Make sure that both sides of the stepladder are locked into place. Ask someone to hold it for you, if possible. Clearly mark and make sure that you can  see: Any grab bars or handrails. First and last steps. Where the edge of each step is. Use tools that help you move around (mobility aids) if they are needed. These include: Canes. Walkers. Scooters. Crutches. Turn on the lights when you go into a dark area. Replace any light bulbs as soon as they burn out. Set up your furniture so you have a clear path. Avoid moving your furniture around. If any of your floors are uneven, fix them. If there are any pets around you, be aware of where they are. Review your medicines with your doctor. Some medicines can make you feel dizzy. This can increase your chance of falling. Ask your doctor what other things that you can do to help prevent falls. This information is not intended to replace advice given to you by your health care provider. Make sure you discuss any questions you have with your health care provider. Document Released: 10/15/2008 Document Revised: 05/27/2015 Document Reviewed: 01/23/2014 Elsevier Interactive Patient Education  2017 [employer].

## 2021-09-09 NOTE — Progress Notes (Signed)
Subjective:   Alexander [name] is a 81 y.o. male who presents for Medicare Annual/Subsequent preventive examination.  Review of Systems      Cardiac Risk Factors include: advanced age (>37mn, >>66women)     Objective:    Today's Vitals   09/09/21 1536  BP: 122/62  Pulse: 69  Temp: 97.6 F (36.4 C)  TempSrc: Oral  SpO2: 98%  Weight: 165 lb (74.8 kg)  Height: 5' 10.5" (1.791 m)   Body mass index is 23.34 kg/m.     09/09/2021    3:56 PM 06/17/2021   12:01 PM 03/08/2021   11:30 AM 12/08/2020    1:35 PM 09/08/2020    3:25 PM 09/08/2020   10:54 AM 05/10/2020    2:53 PM  Advanced Directives  Does Patient Have a Medical Advance Directive? Yes Yes Yes Yes Yes Yes Yes  Type of AParamed[place]of [place] will Living will;Healthcare Power of Attorney  Living will;Healthcare Power of Attorney Living will;Healthcare Power of [place] will Living will  Does patient want to make changes to medical advance directive?  No - Patient declined No - Patient declined No - Patient declined No - Patient declined  No - Patient declined  Copy of [place] Chart? No - copy requested     No - copy requested     Current Medications (verified) Outpatient Encounter Medications as of 09/09/2021  Medication Sig   ACCU-CHEK SOFTCLIX LANCETS lancets Use to test blood glucose once daily   allopurinol (ZYLOPRIM) 100 MG tablet TAKE ONE TABLET BY MOUTH DAILY   amLODipine (NORVASC) 10 MG tablet TAKE ONE TABLET BY MOUTH DAILY   aspirin EC 81 MG tablet Take 1 tablet (81 mg total) by mouth daily.   Blood Glucose Monitoring Suppl (ACCU-CHEK AVIVA PLUS) w/Device KIT Use to test blood glucose once daily   Coenzyme Q10 300 MG CAPS Take 1 capsule by mouth daily.    glucose blood (ACCU-CHEK AVIVA) test strip Use to test blood glucose once daily   hydrocortisone (ANUSOL-HC) 25 MG suppository Place 25 mg rectally daily as needed for  hemorrhoids or anal itching.   lisinopril (ZESTRIL) 10 MG tablet TAKE ONE TABLET BY MOUTH DAILY   methylPREDNISolone (MEDROL DOSEPAK) 4 MG TBPK tablet Take as directed   pantoprazole (PROTONIX) 40 MG tablet TAKE ONE TABLET BY MOUTH DAILY   pravastatin (PRAVACHOL) 40 MG tablet TAKE ONE TABLET BY MOUTH DAILY   PREVIDENT 5000 ENAMEL PROTECT 1.1-5 % PSTE See admin instructions.   PROCTO-MED HC 2.5 % rectal cream APPLY 1 CREAM RECTALLY TWICE DAILY   No facility-administered encounter medications on file as of 09/09/2021.    Allergies (verified) Metformin and related and Metformin hcl   History: Past Medical History:  Diagnosis Date   Arthritis    "minor; shoulders primarily" (05/20/2013)   CAD (coronary artery disease)    a. 05/2013 - Chest pain/unstable angina and dynamic EKG changes showing cath showing atherosclerotic coronary artery disease manifested as diffuse ectasia, normal EF.   Diverticulosis of colon    GERD (gastroesophageal reflux disease)    GI bleed    secondary to Aspirin   Gout    Hematuria, microscopic    Hemorrhoids    Hyperglycemia    Hyperlipidemia    Hypertension    Lumbar back pain    Microalbuminuria    Overweight(278.02)    Polycythemia rubra vera (HCC)    Tubular adenoma of colon  07/2012   Past Surgical History:  Procedure Laterality Date   CARDIAC CATHETERIZATION  05/20/2013   CATARACT EXTRACTION W/ INTRAOCULAR LENS  IMPLANT, BILATERAL Bilateral 2008   CYSTECTOMY  ~ 2000   " SEBACEOUS cyst removed from between shoulder blades"   EXCISIONAL HEMORRHOIDECTOMY  1970's   INGUINAL HERNIA REPAIR Right ~ 2010   LEFT HEART CATHETERIZATION WITH CORONARY ANGIOGRAM N/A 05/20/2013   Procedure: LEFT HEART CATHETERIZATION WITH CORONARY ANGIOGRAM;  Surgeon: Peter M Martinique, MD;  Location: [facility] CATH LAB;  Service: Cardiovascular;  Laterality: N/A;   TONSILLECTOMY AND ADENOIDECTOMY  64's   VASECTOMY     Family History  Problem Relation Age of Onset   Colon cancer  Mother        died at 69, colorectal   Diabetes Mother    Heart disease Father 26       MI   Diabetes Father    Stomach cancer Maternal Grandfather    Cancer Maternal Uncle    Social History   Socioeconomic History   Marital status: Married    Spouse name: Not on file   Number of children: 3   Years of education: Not on file   Highest education level: 12th grade  Occupational History   Occupation: retired Lobbyist: RETIRED  Tobacco Use   Smoking status: Never   Smokeless tobacco: Never   Tobacco comments:    NEVER USED TOBACCO  Vaping Use   Vaping Use: Never used  Substance and Sexual Activity   Alcohol use: No    Alcohol/week: 0.0 standard drinks of alcohol   Drug use: No   Sexual activity: Yes    Partners: Female  Other Topics Concern   Not on file  Social History Narrative   Marriedfor 66 years    Daughter, Higher education careers adviser (pediatrician--lives in [place], [place]), one in [place] - Engineer, water. One in [place] - Chief Strategy Officer          Social Determinants of Health   Financial Resource Strain: [place]  (09/09/2021)   Overall Financial Resource Strain (CARDIA)    Difficulty of Paying Living Expenses: Not hard at all  Food Insecurity: No Food Insecurity (09/09/2021)   Hunger Vital Sign    Worried About Running Out of Food in the Last Year: Never true    [place] in the Last Year: Never true  Transportation Needs: No Transportation Needs (09/09/2021)   PRAPARE - Hydrologist (Medical): No    Lack of Transportation (Non-Medical): No  Physical Activity: Sufficiently Active (09/09/2021)   Exercise Vital Sign    Days of Exercise per Week: 7 days    Minutes of Exercise per Session: 90 min  Stress: No Stress Concern Present (09/09/2021)   [place]    Feeling of Stress : Not at all  Social Connections: [place] (09/09/2021)   Social Connection and Isolation Panel [NHANES]     Frequency of Communication with Friends and Family: More than three times a week    Frequency of Social Gatherings with Friends and Family: More than three times a week    Attends Religious Services: More than 4 times per year    Active Member of [employer] or Organizations: Yes    Attends Music therapist: More than 4 times per year    Marital Status: Married    Tobacco Counseling Counseling given: Not Answered Tobacco comments: NEVER USED TOBACCO  Clinical Intake:  Pre-visit preparation completed: Yes  Pain : No/denies pain     BMI - recorded: 23.34 Nutritional Status: BMI of 19-24  Normal Nutritional Risks: None Diabetes: No  How often do you need to have someone help you when you read instructions, pamphlets, or other written materials from your doctor or pharmacy?: 1 - Never  Diabetic?  no  Interpreter Needed?: No  Information entered by :: Rolene Arbour LPN   Activities of Daily Living    09/09/2021    3:54 PM 09/09/2021    9:03 AM  In your present state of health, do you have any difficulty performing the following activities:  Hearing? 0 0  Vision? 0 0  Difficulty concentrating or making decisions? 0 0  Walking or climbing stairs? 0 0  Dressing or bathing? 0 0  Doing errands, shopping? 0 0  Preparing Food and eating ? N N  Using the Toilet? N N  In the past six months, have you accidently leaked urine? N N  Do you have problems with loss of bowel control? N N  Managing your Medications? N N  Managing your Finances? N N  Housekeeping or managing your Housekeeping? N N    Patient Care Team: [name], NP as PCP - General (Family Medicine) Jerline Pain, MD as PCP - Cardiology (Cardiology) Viona Gilmore, [facility] as Pharmacist (Pharmacist)  Indicate any recent Medical Services you may have received from other than [facility] providers in the past year (date may be approximate).     Assessment:   This is a routine wellness examination for  [employer].  Hearing/Vision screen Hearing Screening - Comments:: Denies hearing difficulties   Vision Screening - Comments:: Wears rx glasses - up to date with routine eye exams with  Dr Pamelia Hoit  Dietary issues and exercise activities discussed: Current Exercise Habits: Home exercise routine, Type of exercise: walking, Time (Minutes): > 60, Frequency (Times/Week): 7, Weekly Exercise (Minutes/Week): 0, Intensity: Moderate, Exercise limited by: None identified   Goals Addressed               This Visit's Progress     Patient stated (pt-stated)        Maintain weight       Depression Screen    09/09/2021    3:51 PM 06/14/2021    3:57 PM 03/16/2021   10:08 AM 03/16/2021   10:07 AM 09/08/2020   10:52 AM 03/12/2020   10:35 AM 09/26/2019    2:30 PM  PHQ 2/9 Scores  PHQ - 2 Score 0 0 0  0 0 0  PHQ- 9 Score  0 0    0  Exception Documentation    Medical reason       Fall Risk    09/09/2021    3:54 PM 09/09/2021    9:03 AM 06/14/2021    3:58 PM 06/10/2021    2:07 PM 03/16/2021   10:06 AM  Fall Risk   Falls in the past year? 0 0 0 0 0  Number falls in past yr: 0 0 0  0  Injury with Fall? 0 0 0  0  Risk for fall due to : No Fall Risks  No Fall Risks  No Fall Risks  Follow up Falls prevention discussed  Falls evaluation completed      FALL RISK PREVENTION PERTAINING TO THE HOME:  Any stairs in or around the home? Yes  If so, are there any without handrails? No  Home free  of loose throw rugs in walkways, pet beds, electrical cords, etc? Yes  Adequate lighting in your home to reduce risk of falls? Yes   ASSISTIVE DEVICES UTILIZED TO PREVENT FALLS:  Life alert? No  Use of a cane, walker or w/c? No  Grab bars in the bathroom? Yes  Shower chair or bench in shower? No  Elevated toilet seat or a handicapped toilet? No   TIMED UP AND GO:  Was the test performed? Yes .  Length of time to ambulate 10 feet: 11 sec.   Gait steady and fast without use of assistive device  Cognitive  Function:        09/09/2021    3:57 PM  6CIT Screen  What Year? 0 points  What month? 0 points  What time? 0 points  Count back from 20 0 points  Months in reverse 0 points  Repeat phrase 0 points  Total Score 0 points    Immunizations Immunization History  Administered Date(s) Administered   Fluad Quad(high Dose 65+) 08/30/2018, 11/19/2019   Influenza Split 09/14/2011   Influenza Whole 10/04/2006, 09/26/2007, 09/16/2008   Influenza, High Dose Seasonal PF 09/20/2013, 09/25/2014, 10/11/2015, 08/31/2016, 09/06/2017   Influenza,inj,Quad PF,6+ Mos 09/11/2012, 09/17/2020   PFIZER Comirnaty(Gray Top)Covid-19 Tri-Sucrose Vaccine 04/07/2020, 10/25/2020   PFIZER(Purple Top)SARS-COV-2 Vaccination 02/09/2019, 03/06/2019, 10/06/2019   Pneumococcal Conjugate-13 10/03/2012, 07/18/2013, 04/22/2014   Pneumococcal Polysaccharide-23 12/18/2006   Tdap 01/17/2012   Zoster Recombinat (Shingrix) 05/29/2017, 11/09/2017   Zoster, Live 09/22/2010    TDAP status: Up to date  Flu Vaccine status: Up to date  Pneumococcal vaccine status: Up to date  Covid-19 vaccine status: Completed vaccines  Qualifies for Shingles Vaccine? Yes   Zostavax completed Yes   Shingrix Completed?: Yes  Screening Tests Health Maintenance  Topic Date Due   COVID-19 Vaccine (6 - Pfizer risk series) 09/25/2021 (Originally 12/20/2020)   COLONOSCOPY (Pts 45-40yr Insurance coverage will need to be confirmed)  03/17/2022 (Originally 07/16/2017)   INFLUENZA VACCINE  04/02/2022 (Originally 08/02/2021)   TETANUS/TDAP  01/16/2022   Pneumonia Vaccine 81 Years old  Completed   Zoster Vaccines- Shingrix  Completed   HPV VACCINES  Aged Out    Health Maintenance  There are no preventive care reminders to display for this patient.   Colorectal cancer screening: No longer required.   Lung Cancer Screening: (Low Dose CT Chest recommended if Age 732-80years, 30 pack-year currently smoking OR have quit w/in 15years.) does not  qualify.     Additional Screening:  Hepatitis C Screening: does not qualify; Completed   Vision Screening: Recommended annual ophthalmology exams for early detection of glaucoma and other disorders of the eye. Is the patient up to date with their annual eye exam?  Yes  Who is the provider or what is the name of the office in which the patient attends annual eye exams? Dr ACaprice RedIf pt is not established with a provider, would they like to be referred to a provider to establish care? No .   Dental Screening: Recommended annual dental exams for proper oral hygiene  Community Resource Referral / Chronic Care Management:  CRR required this visit?  No   CCM required this visit?  No      Plan:     I have personally reviewed and noted the following in the patient's chart:   Medical and social history Use of alcohol, tobacco or illicit drugs  Current medications and supplements including opioid prescriptions. Patient is not currently taking opioid prescriptions.  Functional ability and status Nutritional status Physical activity Advanced directives List of other physicians Hospitalizations, surgeries, and ER visits in previous 12 months Vitals Screenings to include cognitive, depression, and falls Referrals and appointments  In addition, I have reviewed and discussed with patient certain preventive protocols, quality metrics, and best practice recommendations. A written personalized care plan for preventive services as well as general preventive health recommendations were provided to patient.     Criselda Peaches, LPN   4/0/9811   Nurse Notes: None

## 2021-09-14 ENCOUNTER — Ambulatory Visit: Payer: Medicare HMO | Admitting: Family

## 2021-09-14 ENCOUNTER — Ambulatory Visit: Payer: Medicare HMO | Admitting: Hematology & Oncology

## 2021-09-14 ENCOUNTER — Inpatient Hospital Stay: Payer: Medicare HMO

## 2021-09-14 ENCOUNTER — Other Ambulatory Visit: Payer: Medicare HMO

## 2021-09-15 ENCOUNTER — Encounter: Payer: Self-pay | Admitting: Adult Health

## 2021-09-22 ENCOUNTER — Inpatient Hospital Stay: Payer: Medicare HMO | Attending: Hematology & Oncology | Admitting: Hematology & Oncology

## 2021-09-22 ENCOUNTER — Inpatient Hospital Stay: Payer: Medicare HMO

## 2021-10-03 ENCOUNTER — Inpatient Hospital Stay: Payer: Medicare HMO | Admitting: Hematology & Oncology

## 2021-10-03 ENCOUNTER — Inpatient Hospital Stay: Payer: Medicare HMO

## 2021-10-05 ENCOUNTER — Encounter: Payer: Self-pay | Admitting: Hematology & Oncology

## 2021-10-06 ENCOUNTER — Ambulatory Visit (INDEPENDENT_AMBULATORY_CARE_PROVIDER_SITE_OTHER): Payer: Medicare HMO | Admitting: Adult Health

## 2021-10-06 ENCOUNTER — Encounter: Payer: Self-pay | Admitting: Adult Health

## 2021-10-06 VITALS — BP 134/70 | HR 58 | Temp 97.5°F | Wt 166.8 lb

## 2021-10-06 DIAGNOSIS — R220 Localized swelling, mass and lump, head: Secondary | ICD-10-CM | POA: Diagnosis not present

## 2021-10-06 NOTE — Progress Notes (Signed)
Subjective:    Patient ID: Alexander [name], male    DOB: April 21, 1940, 81 y.o.   MRN: [id number]  HPI 81 year old male who  has a past medical history of Arthritis, CAD (coronary artery disease), Diverticulosis of colon, GERD (gastroesophageal reflux disease), GI bleed, Gout, Hematuria, microscopic, Hemorrhoids, Hyperglycemia, Hyperlipidemia, Hypertension, Lumbar back pain, Microalbuminuria, Overweight(278.02), Polycythemia rubra vera ([place]), and Tubular adenoma of colon (07/2012).  He presents to the office today for an acute issue.  He was seen by his periodontitis yesterday and.  On his noted "something in my right nostril" he was advised to follow-up with his PCP.  He does not endorse having any symptoms such as fevers, chills, nasal congestion, sinus pain or pressure, or discolored rhinorrhea.   Review of Systems See HPI   Past Medical History:  Diagnosis Date   Arthritis    "minor; shoulders primarily" (05/20/2013)   CAD (coronary artery disease)    a. 05/2013 - Chest pain/unstable angina and dynamic EKG changes showing cath showing atherosclerotic coronary artery disease manifested as diffuse ectasia, normal EF.   Diverticulosis of colon    GERD (gastroesophageal reflux disease)    GI bleed    secondary to Aspirin   Gout    Hematuria, microscopic    Hemorrhoids    Hyperglycemia    Hyperlipidemia    Hypertension    Lumbar back pain    Microalbuminuria    Overweight(278.02)    Polycythemia rubra vera (HCC)    Tubular adenoma of colon 07/2012    Social History   Socioeconomic History   Marital status: Married    Spouse name: Not on file   Number of children: 3   Years of education: Not on file   Highest education level: 12th grade  Occupational History   Occupation: retired Lobbyist: RETIRED  Tobacco Use   Smoking status: Never   Smokeless tobacco: Never   Tobacco comments:    NEVER USED TOBACCO  Vaping Use   Vaping Use: Never used  Substance and  Sexual Activity   Alcohol use: No    Alcohol/week: 0.0 standard drinks of alcohol   Drug use: No   Sexual activity: Yes    Partners: Female  Other Topics Concern   Not on file  Social History Narrative   Marriedfor 98 years    Daughter, Higher education careers adviser (pediatrician--lives in [place], [place]), one in [place] - Engineer, water. One in [place] - Chief Strategy Officer          Social Determinants of Health   Financial Resource Strain: Low Risk  (09/09/2021)   Overall Financial Resource Strain (CARDIA)    Difficulty of Paying Living Expenses: Not hard at all  Food Insecurity: No Food Insecurity (09/09/2021)   Hunger Vital Sign    Worried About Running Out of Food in the Last Year: Never true    [place] in the Last Year: Never true  Transportation Needs: No Transportation Needs (09/09/2021)   PRAPARE - Hydrologist (Medical): No    Lack of Transportation (Non-Medical): No  Physical Activity: Sufficiently Active (09/09/2021)   Exercise Vital Sign    Days of Exercise per Week: 7 days    Minutes of Exercise per Session: 90 min  Stress: No Stress Concern Present (09/09/2021)   [place]    Feeling of Stress : Not at all  Social Connections: [place] (09/09/2021)   Social  Connection and Isolation Panel [NHANES]    Frequency of Communication with Friends and Family: More than three times a week    Frequency of Social Gatherings with Friends and Family: More than three times a week    Attends Religious Services: More than 4 times per year    Active Member of [employer] or Organizations: Yes    Attends Music therapist: More than 4 times per year    Marital Status: Married  Human resources officer Violence: Not At Risk (09/09/2021)   Humiliation, Afraid, Rape, and Kick questionnaire    Fear of Current or Ex-Partner: No    Emotionally Abused: No    Physically Abused: No    Sexually Abused: No    Past Surgical  History:  Procedure Laterality Date   CARDIAC CATHETERIZATION  05/20/2013   CATARACT EXTRACTION W/ INTRAOCULAR LENS  IMPLANT, BILATERAL Bilateral 2008   CYSTECTOMY  ~ 2000   " SEBACEOUS cyst removed from between shoulder blades"   EXCISIONAL HEMORRHOIDECTOMY  1970's   INGUINAL HERNIA REPAIR Right ~ 2010   [place] N/A 05/20/2013   Procedure: LEFT HEART CATHETERIZATION WITH CORONARY ANGIOGRAM;  Surgeon: Peter M Martinique, MD;  Location: [facility] CATH LAB;  Service: Cardiovascular;  Laterality: N/A;   TONSILLECTOMY AND ADENOIDECTOMY  83's   VASECTOMY      Family History  Problem Relation Age of Onset   Colon cancer Mother        died at 50, colorectal   Diabetes Mother    Heart disease Father 69       MI   Diabetes Father    Stomach cancer Maternal Grandfather    Cancer Maternal Uncle     Allergies  Allergen Reactions   Metformin And Related Diarrhea   Metformin Hcl Other (See Comments)    Current Outpatient Medications on File Prior to Visit  Medication Sig Dispense Refill   ACCU-CHEK SOFTCLIX LANCETS lancets Use to test blood glucose once daily 100 each 3   allopurinol (ZYLOPRIM) 100 MG tablet TAKE ONE TABLET BY MOUTH DAILY 365 tablet 0   amLODipine (NORVASC) 10 MG tablet TAKE ONE TABLET BY MOUTH DAILY 90 tablet 3   aspirin EC 81 MG tablet Take 1 tablet (81 mg total) by mouth daily. 90 tablet 3   Blood Glucose Monitoring Suppl (ACCU-CHEK AVIVA PLUS) w/Device KIT Use to test blood glucose once daily 1 kit 0   Coenzyme Q10 300 MG CAPS Take 1 capsule by mouth daily.      glucose blood (ACCU-CHEK AVIVA) test strip Use to test blood glucose once daily 100 each 3   hydrocortisone (ANUSOL-HC) 25 MG suppository Place 25 mg rectally daily as needed for hemorrhoids or anal itching.     lisinopril (ZESTRIL) 10 MG tablet TAKE ONE TABLET BY MOUTH DAILY 90 tablet 3   pantoprazole (PROTONIX) 40 MG tablet TAKE ONE TABLET BY MOUTH DAILY 365 tablet 0    pravastatin (PRAVACHOL) 40 MG tablet TAKE ONE TABLET BY MOUTH DAILY 365 tablet 0   PREVIDENT 5000 ENAMEL PROTECT 1.1-5 % PSTE See admin instructions.     PROCTO-MED HC 2.5 % rectal cream APPLY 1 CREAM RECTALLY TWICE DAILY 90 g 3   No current facility-administered medications on file prior to visit.    BP 134/70 (BP Location: Left Arm, Patient Position: Sitting, Cuff Size: Normal)   Pulse (!) 58   Temp (!) 97.5 F (36.4 C) (Oral)   Wt 166 lb 12.8 oz (75.7  kg)   SpO2 96%   BMI 23.60 kg/m       Objective:   Physical Exam Vitals and nursing note reviewed.  Constitutional:      Appearance: Normal appearance.  HENT:     Nose: No congestion or rhinorrhea.     Right Nostril: No foreign body or septal hematoma.     Left Nostril: No foreign body or septal hematoma.     Right Turbinates: Not enlarged or swollen.     Left Turbinates: Not enlarged or swollen.     Comments: Does have some mild enlargement of the anterior medial tissue in the right nostril.  No signs of infection, cyst, or abscess.  Appears as extra tissue Neurological:     General: No focal deficit present.     Mental Status: He is alert and oriented to person, place, and time.  Psychiatric:        Mood and Affect: Mood normal.        Behavior: Behavior normal.        Thought Content: Thought content normal.        Assessment & Plan:  1. Nasal swelling -Since he does not have any signs or symptoms of active infection.  We both agreed that watchful waiting would be the best option.  If he decides he wants to be seen by ear nose and throat would be happy to refer him  [name], NP

## 2021-11-04 ENCOUNTER — Encounter: Payer: Self-pay | Admitting: Adult Health

## 2021-11-09 ENCOUNTER — Encounter: Payer: Self-pay | Admitting: Adult Health

## 2021-11-09 MED ORDER — GLUCOSE BLOOD VI STRP: ORAL_STRIP | 3 refills | Status: DC

## 2021-11-09 MED ORDER — ACCU-CHEK SOFTCLIX LANCETS MISC: 3 refills | Status: AC

## 2021-11-09 NOTE — Telephone Encounter (Signed)
Lm advising pt of update.

## 2021-11-09 NOTE — Telephone Encounter (Signed)
Spoke to [facility] and they advised that they will need ov notes and a paper refill to pharmacy. They will need all of this info since pt is new.

## 2021-11-14 ENCOUNTER — Inpatient Hospital Stay: Payer: Medicare HMO

## 2021-11-14 ENCOUNTER — Inpatient Hospital Stay: Payer: Medicare HMO | Attending: Hematology & Oncology

## 2021-11-14 ENCOUNTER — Inpatient Hospital Stay (HOSPITAL_BASED_OUTPATIENT_CLINIC_OR_DEPARTMENT_OTHER): Payer: Medicare HMO | Admitting: Hematology & Oncology

## 2021-11-14 ENCOUNTER — Encounter: Payer: Self-pay | Admitting: Hematology & Oncology

## 2021-11-14 VITALS — BP 125/74 | HR 56 | Temp 97.8°F | Resp 20 | Ht 71.0 in | Wt 173.4 lb

## 2021-11-14 DIAGNOSIS — D45 Polycythemia vera: Secondary | ICD-10-CM

## 2021-11-14 DIAGNOSIS — D751 Secondary polycythemia: Secondary | ICD-10-CM | POA: Insufficient documentation

## 2021-11-14 DIAGNOSIS — Z79899 Other long term (current) drug therapy: Secondary | ICD-10-CM | POA: Diagnosis not present

## 2021-11-14 LAB — IRON AND IRON BINDING CAPACITY (CC-WL,HP ONLY)
Iron: 49 ug/dL (ref 45–182)
Saturation Ratios: 13 % — ABNORMAL LOW (ref 17.9–39.5)
TIBC: 365 ug/dL (ref 250–450)
UIBC: 316 ug/dL (ref 117–376)

## 2021-11-14 LAB — CBC WITH DIFFERENTIAL (CANCER CENTER ONLY)
Abs Immature Granulocytes: 0.02 10*3/uL (ref 0.00–0.07)
Basophils Absolute: 0.1 10*3/uL (ref 0.0–0.1)
Basophils Relative: 1 %
Eosinophils Absolute: 0.3 10*3/uL (ref 0.0–0.5)
Eosinophils Relative: 4 %
HCT: 42 % (ref 39.0–52.0)
Hemoglobin: 13.6 g/dL (ref 13.0–17.0)
Immature Granulocytes: 0 %
Lymphocytes Relative: 22 %
Lymphs Abs: 1.8 10*3/uL (ref 0.7–4.0)
MCH: 29 pg (ref 26.0–34.0)
MCHC: 32.4 g/dL (ref 30.0–36.0)
MCV: 89.6 fL (ref 80.0–100.0)
Monocytes Absolute: 1.1 10*3/uL — ABNORMAL HIGH (ref 0.1–1.0)
Monocytes Relative: 13 %
Neutro Abs: 4.9 10*3/uL (ref 1.7–7.7)
Neutrophils Relative %: 60 %
Platelet Count: 247 10*3/uL (ref 150–400)
RBC: 4.69 MIL/uL (ref 4.22–5.81)
RDW: 16 % — ABNORMAL HIGH (ref 11.5–15.5)
WBC Count: 8.3 10*3/uL (ref 4.0–10.5)
nRBC: 0 % (ref 0.0–0.2)

## 2021-11-14 LAB — CMP (CANCER CENTER ONLY)
ALT: 14 U/L (ref 0–44)
AST: 15 U/L (ref 15–41)
Albumin: 4.2 g/dL (ref 3.5–5.0)
Alkaline Phosphatase: 84 U/L (ref 38–126)
Anion gap: 5 (ref 5–15)
BUN: 23 mg/dL (ref 8–23)
CO2: 29 mmol/L (ref 22–32)
Calcium: 9.6 mg/dL (ref 8.9–10.3)
Chloride: 105 mmol/L (ref 98–111)
Creatinine: 1.24 mg/dL (ref 0.61–1.24)
GFR, Estimated: 58 mL/min — ABNORMAL LOW (ref >60)
Glucose, Bld: 160 mg/dL — ABNORMAL HIGH (ref 70–99)
Potassium: 4.5 mmol/L (ref 3.5–5.1)
Sodium: 139 mmol/L (ref 135–145)
Total Bilirubin: 0.5 mg/dL (ref 0.3–1.2)
Total Protein: 7 g/dL (ref 6.5–8.1)

## 2021-11-14 LAB — FERRITIN: Ferritin: 16 ng/mL — ABNORMAL LOW (ref 24–336)

## 2021-11-14 NOTE — Progress Notes (Signed)
Hematology and Oncology Follow Up Visit  Alexander [name] [id number] 1940-02-24 81 y.o. 11/14/2021   Principle Diagnosis:  Secondary Polycythemia/Erythrocytosis-JAK2 negative  Current Therapy:   Phlebotomy to maintain hematocrit below 45% - 12/29/2015 Aspirin 81 mg by mouth daily     Interim History:  [name] is in for a followup.  He comes in with his wife.  I have not seen her for quite a while.  Thankfully, she is doing better now that her UTI has been treated.  They are under quite a bit of stress.  And family over it is real.  Thankfully, the family is not involved with the finding that is going on over there.  He feels okay otherwise.  He is trying to stay active.  He has had no problems with cough or shortness of breath.  He has had no change in bowel or bladder habits.  He has had no leg swelling.  His heart has been doing okay.  He has had no palpitations.  Currently, I would have to say that his performance status is probably [name].   Medications:  Current Outpatient Medications:    Accu-Chek Softclix Lancets lancets, Use to test blood glucose once daily, Disp: 100 each, Rfl: 3   allopurinol (ZYLOPRIM) 100 MG tablet, TAKE ONE TABLET BY MOUTH DAILY, Disp: 365 tablet, Rfl: 0   amLODipine (NORVASC) 10 MG tablet, TAKE ONE TABLET BY MOUTH DAILY, Disp: 90 tablet, Rfl: 3   aspirin EC 81 MG tablet, Take 1 tablet (81 mg total) by mouth daily., Disp: 90 tablet, Rfl: 3   Blood Glucose Monitoring Suppl (ACCU-CHEK AVIVA PLUS) w/Device KIT, Use to test blood glucose once daily, Disp: 1 kit, Rfl: 0   Coenzyme Q10 300 MG CAPS, Take 1 capsule by mouth daily. , Disp: , Rfl:    glucose blood (ACCU-CHEK AVIVA) test strip, Use to test blood glucose once daily, Disp: 100 each, Rfl: 3   hydrocortisone (ANUSOL-HC) 25 MG suppository, Place 25 mg rectally daily as needed for hemorrhoids or anal itching., Disp: , Rfl:    lisinopril (ZESTRIL) 10 MG tablet, TAKE ONE TABLET BY MOUTH DAILY,  Disp: 90 tablet, Rfl: 3   pantoprazole (PROTONIX) 40 MG tablet, TAKE ONE TABLET BY MOUTH DAILY, Disp: 365 tablet, Rfl: 0   pravastatin (PRAVACHOL) 40 MG tablet, TAKE ONE TABLET BY MOUTH DAILY, Disp: 365 tablet, Rfl: 0   PREVIDENT 5000 ENAMEL PROTECT 1.1-5 % PSTE, See admin instructions., Disp: , Rfl:    PROCTO-MED HC 2.5 % rectal cream, APPLY 1 CREAM RECTALLY TWICE DAILY, Disp: 90 g, Rfl: 3  Allergies:  Allergies  Allergen Reactions   Metformin And Related Diarrhea   Metformin Hcl Other (See Comments)    Past Medical History, Surgical history, Social history, and Family History were reviewed and updated.  Review of Systems: Review of Systems  Constitutional: Negative.   HENT: Negative.    Eyes: Negative.   Respiratory: Negative.    Cardiovascular: Negative.   Gastrointestinal: Negative.   Genitourinary: Negative.   Musculoskeletal: Negative.   Skin: Negative.   Neurological: Negative.   Endo/Heme/Allergies: Negative.   Psychiatric/Behavioral: Negative.      Physical Exam:  height is _0  (1.803 m) and weight is 173 lb 6.4 oz (78.7 kg). His oral temperature is 97.8 F (36.6 C). His blood pressure is 125/74 and his pulse is 56 (abnormal). His respiration is 20 and oxygen saturation is 100%.   Physical Exam Vitals reviewed.  HENT:  Head: Normocephalic and atraumatic.  Eyes:     Pupils: Pupils are equal, round, and reactive to light.  Cardiovascular:     Rate and Rhythm: Normal rate and regular rhythm.     Heart sounds: Normal heart sounds.  Pulmonary:     Effort: Pulmonary effort is normal.     Breath sounds: Normal breath sounds.  Abdominal:     General: Bowel sounds are normal.     Palpations: Abdomen is soft.  Musculoskeletal:        General: No tenderness or deformity. Normal range of motion.     Cervical back: Normal range of motion.  Lymphadenopathy:     Cervical: No cervical adenopathy.  Skin:    General: Skin is warm and dry.     Findings: No  erythema or rash.  Neurological:     Mental Status: He is alert and oriented to person, place, and time.  Psychiatric:        Behavior: Behavior normal.        Thought Content: Thought content normal.        Judgment: Judgment normal.      Lab Results  Component Value Date   WBC 8.3 11/14/2021   HGB 13.6 11/14/2021   HCT 42.0 11/14/2021   MCV 89.6 11/14/2021   PLT 247 11/14/2021     Chemistry      Component Value Date/Time   NA 139 11/14/2021 1023   NA 144 11/29/2016 1054   NA 139 12/29/2015 0954   K 4.5 11/14/2021 1023   K 4.2 11/29/2016 1054   K 4.1 12/29/2015 0954   CL 105 11/14/2021 1023   CL 102 11/29/2016 1054   CO2 29 11/14/2021 1023   CO2 30 11/29/2016 1054   CO2 26 12/29/2015 0954   BUN 23 11/14/2021 1023   BUN 22 11/29/2016 1054   BUN 20.0 12/29/2015 0954   CREATININE 1.24 11/14/2021 1023   CREATININE 1.2 11/29/2016 1054   CREATININE 1.3 12/29/2015 0954      Component Value Date/Time   CALCIUM 9.6 11/14/2021 1023   CALCIUM 9.3 11/29/2016 1054   CALCIUM 9.5 12/29/2015 0954   ALKPHOS 84 11/14/2021 1023   ALKPHOS 92 (H) 11/29/2016 1054   ALKPHOS 98 12/29/2015 0954   AST 15 11/14/2021 1023   AST 16 12/29/2015 0954   ALT 14 11/14/2021 1023   ALT 20 11/29/2016 1054   ALT 13 12/29/2015 0954   BILITOT 0.5 11/14/2021 1023   BILITOT 0.71 12/29/2015 0954      Impression and Plan: Alexander [name] is 81 year old gentleman. He has secondary polycythemia/erythrocytosis. He is JAK2 negative.  Thankfully, he does not need to be phlebotomized.  I am just happy about this.  I know that the wart that is going on over in his real is a concern to he and his wife.  They were supposed to go over to see family, I think in the Winter.  However, this has been postponed.  We will get him through the holidays.  I will plan to see him back in February.   [name], MD 11/13/202311:51 AM

## 2021-12-14 ENCOUNTER — Ambulatory Visit (INDEPENDENT_AMBULATORY_CARE_PROVIDER_SITE_OTHER): Payer: Medicare HMO | Admitting: Adult Health

## 2021-12-14 ENCOUNTER — Encounter: Payer: Self-pay | Admitting: Adult Health

## 2021-12-14 VITALS — BP 110/80 | HR 55 | Temp 97.6°F | Ht 71.0 in | Wt 171.0 lb

## 2021-12-14 DIAGNOSIS — E1169 Type 2 diabetes mellitus with other specified complication: Secondary | ICD-10-CM

## 2021-12-14 DIAGNOSIS — I10 Essential (primary) hypertension: Secondary | ICD-10-CM

## 2021-12-14 LAB — POCT GLYCOSYLATED HEMOGLOBIN (HGB A1C): Hemoglobin A1C: 6.6 % — AB (ref 4.0–5.6)

## 2021-12-14 NOTE — Progress Notes (Signed)
Subjective:    Patient ID: Alexander [name], male    DOB: Jan 28, 1940, 81 y.o.   MRN: [id number]  HPI 81 year old male who  has a past medical history of Arthritis, CAD (coronary artery disease), Diverticulosis of colon, GERD (gastroesophageal reflux disease), GI bleed, Gout, Hematuria, microscopic, Hemorrhoids, Hyperglycemia, Hyperlipidemia, Hypertension, Lumbar back pain, Microalbuminuria, Overweight(278.02), Polycythemia rubra vera ([place]), and Tubular adenoma of colon (07/2012).  Diabetes Mellitus -currently diet controlled. He has been not watching his diet and eating more baked goods during the holidays. in March 2023 when his A1c was 6.8 and in June his A1c was 5.9. Baseline A1c previously roughly 6.2.    HTN - Managed with Norvasc 10 mg daily, lisinopril managed with Norvasc 10 mg daily and lisinopril 10 mg daily.  He denies dizziness, lightheadedness, chest pain, or shortness of breath.  BP Readings from Last 3 Encounters:  12/14/21 110/80  11/14/21 125/74  10/06/21 134/70      Review of Systems See HPI   Past Medical History:  Diagnosis Date   Arthritis    "minor; shoulders primarily" (05/20/2013)   CAD (coronary artery disease)    a. 05/2013 - Chest pain/unstable angina and dynamic EKG changes showing cath showing atherosclerotic coronary artery disease manifested as diffuse ectasia, normal EF.   Diverticulosis of colon    GERD (gastroesophageal reflux disease)    GI bleed    secondary to Aspirin   Gout    Hematuria, microscopic    Hemorrhoids    Hyperglycemia    Hyperlipidemia    Hypertension    Lumbar back pain    Microalbuminuria    Overweight(278.02)    Polycythemia rubra vera (HCC)    Tubular adenoma of colon 07/2012    Social History   Socioeconomic History   Marital status: Married    Spouse name: Not on file   Number of children: 3   Years of education: Not on file   Highest education level: 12th grade  Occupational History   Occupation: retired  Lobbyist: RETIRED  Tobacco Use   Smoking status: Never   Smokeless tobacco: Never   Tobacco comments:    NEVER USED TOBACCO  Vaping Use   Vaping Use: Never used  Substance and Sexual Activity   Alcohol use: No    Alcohol/week: 0.0 standard drinks of alcohol   Drug use: No   Sexual activity: Yes    Partners: Female  Other Topics Concern   Not on file  Social History Narrative   Marriedfor 68 years    Daughter, Higher education careers adviser (pediatrician--lives in [place], [place]), one in [place] - Engineer, water. One in [place] - Chief Strategy Officer          Social Determinants of Health   Financial Resource Strain: Low Risk  (09/09/2021)   Overall Financial Resource Strain (CARDIA)    Difficulty of Paying Living Expenses: Not hard at all  Food Insecurity: No Food Insecurity (09/09/2021)   Hunger Vital Sign    Worried About Running Out of Food in the Last Year: Never true    [place] in the Last Year: Never true  Transportation Needs: No Transportation Needs (09/09/2021)   PRAPARE - Hydrologist (Medical): No    Lack of Transportation (Non-Medical): No  Physical Activity: Sufficiently Active (09/09/2021)   Exercise Vital Sign    Days of Exercise per Week: 7 days    Minutes of Exercise per Session: 90 min  Stress: No Stress Concern Present (09/09/2021)   [place]    Feeling of Stress : Not at all  Social Connections: Socially Integrated (09/09/2021)   Social Connection and Isolation Panel [NHANES]    Frequency of Communication with Friends and Family: More than three times a week    Frequency of Social Gatherings with Friends and Family: More than three times a week    Attends Religious Services: More than 4 times per year    Active Member of [employer] or Organizations: Yes    Attends Music therapist: More than 4 times per year    Marital Status: Married  Human resources officer Violence: Not At Risk  (09/09/2021)   Humiliation, Afraid, Rape, and Kick questionnaire    Fear of Current or Ex-Partner: No    Emotionally Abused: No    Physically Abused: No    Sexually Abused: No    Past Surgical History:  Procedure Laterality Date   CARDIAC CATHETERIZATION  05/20/2013   CATARACT EXTRACTION W/ INTRAOCULAR LENS  IMPLANT, BILATERAL Bilateral 2008   CYSTECTOMY  ~ 2000   " SEBACEOUS cyst removed from between shoulder blades"   EXCISIONAL HEMORRHOIDECTOMY  1970's   INGUINAL HERNIA REPAIR Right ~ 2010   [place] N/A 05/20/2013   Procedure: LEFT HEART CATHETERIZATION WITH CORONARY ANGIOGRAM;  Surgeon: Peter M Martinique, MD;  Location: [facility] CATH LAB;  Service: Cardiovascular;  Laterality: N/A;   TONSILLECTOMY AND ADENOIDECTOMY  81's   VASECTOMY      Family History  Problem Relation Age of Onset   Colon cancer Mother        died at 10, colorectal   Diabetes Mother    Heart disease Father 21       MI   Diabetes Father    Stomach cancer Maternal Grandfather    Cancer Maternal Uncle     Allergies  Allergen Reactions   Metformin And Related Diarrhea   Metformin Hcl Other (See Comments)    Current Outpatient Medications on File Prior to Visit  Medication Sig Dispense Refill   Accu-Chek Softclix Lancets lancets Use to test blood glucose once daily 100 each 3   allopurinol (ZYLOPRIM) 100 MG tablet TAKE ONE TABLET BY MOUTH DAILY 365 tablet 0   amLODipine (NORVASC) 10 MG tablet TAKE ONE TABLET BY MOUTH DAILY 90 tablet 3   aspirin EC 81 MG tablet Take 1 tablet (81 mg total) by mouth daily. 90 tablet 3   Blood Glucose Monitoring Suppl (ACCU-CHEK AVIVA PLUS) w/Device KIT Use to test blood glucose once daily 1 kit 0   Coenzyme Q10 300 MG CAPS Take 1 capsule by mouth daily.      glucose blood (ACCU-CHEK AVIVA) test strip Use to test blood glucose once daily 100 each 3   hydrocortisone (ANUSOL-HC) 25 MG suppository Place 25 mg rectally daily as needed for  hemorrhoids or anal itching.     lisinopril (ZESTRIL) 10 MG tablet TAKE ONE TABLET BY MOUTH DAILY 90 tablet 3   pantoprazole (PROTONIX) 40 MG tablet TAKE ONE TABLET BY MOUTH DAILY 365 tablet 0   pravastatin (PRAVACHOL) 40 MG tablet TAKE ONE TABLET BY MOUTH DAILY 365 tablet 0   PREVIDENT 5000 ENAMEL PROTECT 1.1-5 % PSTE See admin instructions.     PROCTO-MED HC 2.5 % rectal cream APPLY 1 CREAM RECTALLY TWICE DAILY 90 g 3   No current facility-administered medications on file prior to visit.  There were no vitals taken for this visit.      Objective:   Physical Exam Vitals and nursing note reviewed.  Constitutional:      Appearance: Normal appearance.  Cardiovascular:     Rate and Rhythm: Normal rate and regular rhythm.     Pulses: Normal pulses.     Heart sounds: Normal heart sounds.  Pulmonary:     Effort: Pulmonary effort is normal.     Breath sounds: Normal breath sounds.  Musculoskeletal:        General: Normal range of motion.  Skin:    General: Skin is warm and dry.  Neurological:     General: No focal deficit present.     Mental Status: He is alert and oriented to person, place, and time.  Psychiatric:        Mood and Affect: Mood normal.        Behavior: Behavior normal.        Thought Content: Thought content normal.        Judgment: Judgment normal.       Assessment & Plan:  1. Essential hypertension - Well controlled. No change in medication   2. Type 2 diabetes mellitus with other specified complication, without long-term current use of insulin (HCC)  - POC HgB A1c- 6.6.  - Cut back on sugars and carbs - Continue to exercise   [name], NP

## 2022-01-26 ENCOUNTER — Encounter: Payer: Self-pay | Admitting: Adult Health

## 2022-01-26 NOTE — Telephone Encounter (Signed)
FYI

## 2022-01-27 ENCOUNTER — Encounter: Payer: Self-pay | Admitting: Adult Health

## 2022-01-27 ENCOUNTER — Ambulatory Visit (INDEPENDENT_AMBULATORY_CARE_PROVIDER_SITE_OTHER): Payer: Medicare HMO | Admitting: Adult Health

## 2022-01-27 VITALS — BP 118/68 | HR 64 | Temp 97.4°F | Ht 71.0 in | Wt 169.0 lb

## 2022-01-27 DIAGNOSIS — E1169 Type 2 diabetes mellitus with other specified complication: Secondary | ICD-10-CM

## 2022-01-27 DIAGNOSIS — R7309 Other abnormal glucose: Secondary | ICD-10-CM

## 2022-01-27 LAB — GLUCOSE, POCT (MANUAL RESULT ENTRY): POC Glucose: 150 mg/dl — AB (ref 70–99)

## 2022-01-27 NOTE — Progress Notes (Signed)
Subjective:    Patient ID: Alexander [name], male    DOB: 03/08/1940, 82 y.o.   MRN: [id number]  HPI 82 year old male who  has a past medical history of Arthritis, CAD (coronary artery disease), Diverticulosis of colon, GERD (gastroesophageal reflux disease), GI bleed, Gout, Hematuria, microscopic, Hemorrhoids, Hyperglycemia, Hyperlipidemia, Hypertension, Lumbar back pain, Microalbuminuria, Overweight(278.02), Polycythemia rubra vera ([place]), and Tubular adenoma of colon (07/2012).  He presents to the office today for concern of elevated blood sugar. He has a history of DM type 2 and has been mostly diet controlled. Has an intolerance to metformin. He has noticed that when he checks his BS at home that his fasting blood sugars have been in the 150-160 range. He does reports that he has been eating more carbs then usual and has not been exercising as much   Lab Results  Component Value Date   HGBA1C 6.6 (A) 12/14/2021   Review of Systems See HPI   Past Medical History:  Diagnosis Date   Arthritis    "minor; shoulders primarily" (05/20/2013)   CAD (coronary artery disease)    a. 05/2013 - Chest pain/unstable angina and dynamic EKG changes showing cath showing atherosclerotic coronary artery disease manifested as diffuse ectasia, normal EF.   Diverticulosis of colon    GERD (gastroesophageal reflux disease)    GI bleed    secondary to Aspirin   Gout    Hematuria, microscopic    Hemorrhoids    Hyperglycemia    Hyperlipidemia    Hypertension    Lumbar back pain    Microalbuminuria    Overweight(278.02)    Polycythemia rubra vera (HCC)    Tubular adenoma of colon 07/2012    Social History   Socioeconomic History   Marital status: Married    Spouse name: Not on file   Number of children: 3   Years of education: Not on file   Highest education level: 12th grade  Occupational History   Occupation: retired Lobbyist: RETIRED  Tobacco Use   Smoking status: Never    Smokeless tobacco: Never   Tobacco comments:    NEVER USED TOBACCO  Vaping Use   Vaping Use: Never used  Substance and Sexual Activity   Alcohol use: No    Alcohol/week: 0.0 standard drinks of alcohol   Drug use: No   Sexual activity: Yes    Partners: Female  Other Topics Concern   Not on file  Social History Narrative   Marriedfor 49 years    Daughter, Higher education careers adviser (pediatrician--lives in [place], [place]), one in [place] - Engineer, water. One in [place] - Chief Strategy Officer          Social Determinants of Health   Financial Resource Strain: Low Risk  (09/09/2021)   Overall Financial Resource Strain (CARDIA)    Difficulty of Paying Living Expenses: Not hard at all  Food Insecurity: No Food Insecurity (09/09/2021)   Hunger Vital Sign    Worried About Running Out of Food in the Last Year: Never true    [place] in the Last Year: Never true  Transportation Needs: No Transportation Needs (09/09/2021)   PRAPARE - Hydrologist (Medical): No    Lack of Transportation (Non-Medical): No  Physical Activity: Sufficiently Active (09/09/2021)   Exercise Vital Sign    Days of Exercise per Week: 7 days    Minutes of Exercise per Session: 90 min  Stress: No Stress Concern Present (09/09/2021)  [employer] Questionnaire    Feeling of Stress : Not at all  Social Connections: Socially Integrated (09/09/2021)   Social Connection and Isolation Panel [NHANES]    Frequency of Communication with Friends and Family: More than three times a week    Frequency of Social Gatherings with Friends and Family: More than three times a week    Attends Religious Services: More than 4 times per year    Active Member of [employer] or Organizations: Yes    Attends Music therapist: More than 4 times per year    Marital Status: Married  Human resources officer Violence: Not At Risk (09/09/2021)   Humiliation, Afraid, Rape, and Kick questionnaire    Fear of  Current or Ex-Partner: No    Emotionally Abused: No    Physically Abused: No    Sexually Abused: No    Past Surgical History:  Procedure Laterality Date   CARDIAC CATHETERIZATION  05/20/2013   CATARACT EXTRACTION W/ INTRAOCULAR LENS  IMPLANT, BILATERAL Bilateral 2008   CYSTECTOMY  ~ 2000   " SEBACEOUS cyst removed from between shoulder blades"   EXCISIONAL HEMORRHOIDECTOMY  1970's   INGUINAL HERNIA REPAIR Right ~ 2010   [place] N/A 05/20/2013   Procedure: LEFT HEART CATHETERIZATION WITH CORONARY ANGIOGRAM;  Surgeon: Peter M Martinique, MD;  Location: [facility] CATH LAB;  Service: Cardiovascular;  Laterality: N/A;   TONSILLECTOMY AND ADENOIDECTOMY  36's   VASECTOMY      Family History  Problem Relation Age of Onset   Colon cancer Mother        died at 25, colorectal   Diabetes Mother    Heart disease Father 2       MI   Diabetes Father    Stomach cancer Maternal Grandfather    Cancer Maternal Uncle     Allergies  Allergen Reactions   Metformin And Related Diarrhea   Metformin Hcl Other (See Comments)    Current Outpatient Medications on File Prior to Visit  Medication Sig Dispense Refill   Accu-Chek Softclix Lancets lancets Use to test blood glucose once daily 100 each 3   allopurinol (ZYLOPRIM) 100 MG tablet TAKE ONE TABLET BY MOUTH DAILY 365 tablet 0   amLODipine (NORVASC) 10 MG tablet TAKE ONE TABLET BY MOUTH DAILY 90 tablet 3   aspirin EC 81 MG tablet Take 1 tablet (81 mg total) by mouth daily. 90 tablet 3   Blood Glucose Monitoring Suppl (ACCU-CHEK AVIVA PLUS) w/Device KIT Use to test blood glucose once daily 1 kit 0   Coenzyme Q10 300 MG CAPS Take 1 capsule by mouth daily.      glucose blood (ACCU-CHEK AVIVA) test strip Use to test blood glucose once daily 100 each 3   hydrocortisone (ANUSOL-HC) 25 MG suppository Place 25 mg rectally daily as needed for hemorrhoids or anal itching.     lisinopril (ZESTRIL) 10 MG tablet TAKE ONE  TABLET BY MOUTH DAILY 90 tablet 3   pantoprazole (PROTONIX) 40 MG tablet TAKE ONE TABLET BY MOUTH DAILY 365 tablet 0   pravastatin (PRAVACHOL) 40 MG tablet TAKE ONE TABLET BY MOUTH DAILY 365 tablet 0   PREVIDENT 5000 ENAMEL PROTECT 1.1-5 % PSTE See admin instructions.     PROCTO-MED HC 2.5 % rectal cream APPLY 1 CREAM RECTALLY TWICE DAILY 90 g 3   No current facility-administered medications on file prior to visit.    BP 118/68   Pulse 64  Temp (!) 97.4 F (36.3 C) (Oral)   Ht '5\' 11"'$  (1.803 m)   Wt 169 lb (76.7 kg)   SpO2 99%   BMI 23.57 kg/m       Objective:   Physical Exam Vitals and nursing note reviewed.  Constitutional:      Appearance: Normal appearance.  Cardiovascular:     Rate and Rhythm: Normal rate and regular rhythm.     Pulses: Normal pulses.     Heart sounds: Normal heart sounds.  Pulmonary:     Effort: Pulmonary effort is normal.     Breath sounds: Normal breath sounds.  Musculoskeletal:        General: Normal range of motion.  Skin:    General: Skin is warm and dry.     Capillary Refill: Capillary refill takes less than 2 seconds.  Neurological:     General: No focal deficit present.     Mental Status: He is alert and oriented to person, place, and time.  Psychiatric:        Mood and Affect: Mood normal.        Behavior: Behavior normal.        Thought Content: Thought content normal.         Assessment & Plan:  1. Type 2 diabetes mellitus with other specified complication, without long-term current use of insulin (HCC)  - POC Glucose (CBG)- 150  - Encouraged to start exercising again and eating less carbs.  - Monitor BS over the next few weeks and if continues to be elevated can consider starting on medication   [name], NP  Time spent with patient today was 30 minutes which consisted of chart review, discussing diagnosis, work up, treatment answering questions, listening,  and documentation.

## 2022-02-14 ENCOUNTER — Inpatient Hospital Stay: Payer: Medicare HMO

## 2022-02-14 ENCOUNTER — Inpatient Hospital Stay: Payer: Medicare HMO | Admitting: Medical Oncology

## 2022-02-21 ENCOUNTER — Encounter: Payer: Self-pay | Admitting: Adult Health

## 2022-02-21 ENCOUNTER — Encounter: Payer: Self-pay | Admitting: Hematology & Oncology

## 2022-02-21 ENCOUNTER — Other Ambulatory Visit: Payer: Self-pay

## 2022-02-21 ENCOUNTER — Inpatient Hospital Stay (HOSPITAL_BASED_OUTPATIENT_CLINIC_OR_DEPARTMENT_OTHER): Payer: Medicare HMO | Admitting: Hematology & Oncology

## 2022-02-21 ENCOUNTER — Inpatient Hospital Stay: Payer: Medicare HMO | Attending: Hematology & Oncology

## 2022-02-21 VITALS — BP 122/64 | HR 54 | Temp 97.9°F | Resp 18 | Ht 71.0 in | Wt 165.1 lb

## 2022-02-21 DIAGNOSIS — Z79899 Other long term (current) drug therapy: Secondary | ICD-10-CM | POA: Insufficient documentation

## 2022-02-21 DIAGNOSIS — D45 Polycythemia vera: Secondary | ICD-10-CM

## 2022-02-21 DIAGNOSIS — D751 Secondary polycythemia: Secondary | ICD-10-CM | POA: Insufficient documentation

## 2022-02-21 DIAGNOSIS — Z7982 Long term (current) use of aspirin: Secondary | ICD-10-CM | POA: Diagnosis not present

## 2022-02-21 LAB — CBC WITH DIFFERENTIAL (CANCER CENTER ONLY)
Abs Immature Granulocytes: 0.03 10*3/uL (ref 0.00–0.07)
Basophils Absolute: 0.1 10*3/uL (ref 0.0–0.1)
Basophils Relative: 1 %
Eosinophils Absolute: 0.3 10*3/uL (ref 0.0–0.5)
Eosinophils Relative: 3 %
HCT: 44 % (ref 39.0–52.0)
Hemoglobin: 14.4 g/dL (ref 13.0–17.0)
Immature Granulocytes: 0 %
Lymphocytes Relative: 22 %
Lymphs Abs: 2.3 10*3/uL (ref 0.7–4.0)
MCH: 29.9 pg (ref 26.0–34.0)
MCHC: 32.7 g/dL (ref 30.0–36.0)
MCV: 91.3 fL (ref 80.0–100.0)
Monocytes Absolute: 1.2 10*3/uL — ABNORMAL HIGH (ref 0.1–1.0)
Monocytes Relative: 12 %
Neutro Abs: 6.4 10*3/uL (ref 1.7–7.7)
Neutrophils Relative %: 62 %
Platelet Count: 235 10*3/uL (ref 150–400)
RBC: 4.82 MIL/uL (ref 4.22–5.81)
RDW: 15.2 % (ref 11.5–15.5)
WBC Count: 10.3 10*3/uL (ref 4.0–10.5)
nRBC: 0 % (ref 0.0–0.2)

## 2022-02-21 LAB — CMP (CANCER CENTER ONLY)
ALT: 15 U/L (ref 0–44)
AST: 15 U/L (ref 15–41)
Albumin: 4.5 g/dL (ref 3.5–5.0)
Alkaline Phosphatase: 83 U/L (ref 38–126)
Anion gap: 8 (ref 5–15)
BUN: 23 mg/dL (ref 8–23)
CO2: 27 mmol/L (ref 22–32)
Calcium: 9.9 mg/dL (ref 8.9–10.3)
Chloride: 103 mmol/L (ref 98–111)
Creatinine: 1.35 mg/dL — ABNORMAL HIGH (ref 0.61–1.24)
GFR, Estimated: 52 mL/min — ABNORMAL LOW (ref >60)
Glucose, Bld: 197 mg/dL — ABNORMAL HIGH (ref 70–99)
Potassium: 4.4 mmol/L (ref 3.5–5.1)
Sodium: 138 mmol/L (ref 135–145)
Total Bilirubin: 0.9 mg/dL (ref 0.3–1.2)
Total Protein: 7.3 g/dL (ref 6.5–8.1)

## 2022-02-21 LAB — IRON AND IRON BINDING CAPACITY (CC-WL,HP ONLY)
Iron: 112 ug/dL (ref 45–182)
Saturation Ratios: 31 % (ref 17.9–39.5)
TIBC: 360 ug/dL (ref 250–450)
UIBC: 248 ug/dL (ref 117–376)

## 2022-02-21 LAB — LACTATE DEHYDROGENASE: LDH: 114 U/L (ref 98–192)

## 2022-02-21 LAB — FERRITIN: Ferritin: 60 ng/mL (ref 24–336)

## 2022-02-21 NOTE — Progress Notes (Signed)
Hematology and Oncology Follow Up Visit  AMAL PALMATIER [id number] 07-16-1940 82 y.o. 02/21/2022   Principle Diagnosis:  Secondary Polycythemia/Erythrocytosis-JAK2 negative  Current Therapy:   Phlebotomy to maintain hematocrit below 45% - 12/29/2015 Aspirin 81 mg by mouth daily     Interim History:  Mr.  Holahan is in for a followup.  He is doing quite well.  Unfortunate, his wife is not doing well.  She is having a lot of problems with urination.  Sounds like she has recurrent UTIs.  He has had no problems since we last saw.  Had no problems over the Holiday season.  There is been no issues with rashes.  Has had no cardiac problems.  He sees his cardiologist once a year.  When we last saw him back in November, his ferritin was 16 with an iron saturation of 13%.  He has had no problems with fever.  He has had no issues with COVID.  There is no cough or shortness of breath.  He has had no headache.  He has had no skin rashes.  He has had no leg swelling.  That he had a complete physical a day or so ago by a visiting nurse from the insurance company.  Overall, I would say that his performance status is probably [name].    Medications:  Current Outpatient Medications:    Accu-Chek Softclix Lancets lancets, Use to test blood glucose once daily, Disp: 100 each, Rfl: 3   allopurinol (ZYLOPRIM) 100 MG tablet, TAKE ONE TABLET BY MOUTH DAILY, Disp: 365 tablet, Rfl: 0   amLODipine (NORVASC) 10 MG tablet, TAKE ONE TABLET BY MOUTH DAILY, Disp: 90 tablet, Rfl: 3   aspirin EC 81 MG tablet, Take 1 tablet (81 mg total) by mouth daily., Disp: 90 tablet, Rfl: 3   Blood Glucose Monitoring Suppl (ACCU-CHEK AVIVA PLUS) w/Device KIT, Use to test blood glucose once daily, Disp: 1 kit, Rfl: 0   Coenzyme Q10 300 MG CAPS, Take 1 capsule by mouth daily. , Disp: , Rfl:    glucose blood (ACCU-CHEK AVIVA) test strip, Use to test blood glucose once daily, Disp: 100 each, Rfl: 3   hydrocortisone (ANUSOL-HC)  25 MG suppository, Place 25 mg rectally daily as needed for hemorrhoids or anal itching., Disp: , Rfl:    lisinopril (ZESTRIL) 10 MG tablet, TAKE ONE TABLET BY MOUTH DAILY, Disp: 90 tablet, Rfl: 3   pantoprazole (PROTONIX) 40 MG tablet, TAKE ONE TABLET BY MOUTH DAILY, Disp: 365 tablet, Rfl: 0   pravastatin (PRAVACHOL) 40 MG tablet, TAKE ONE TABLET BY MOUTH DAILY, Disp: 365 tablet, Rfl: 0   PREVIDENT 5000 ENAMEL PROTECT 1.1-5 % PSTE, See admin instructions., Disp: , Rfl:    PROCTO-MED HC 2.5 % rectal cream, APPLY 1 CREAM RECTALLY TWICE DAILY, Disp: 90 g, Rfl: 3  Allergies:  Allergies  Allergen Reactions   Metformin And Related Diarrhea   Metformin Hcl Other (See Comments)    Past Medical History, Surgical history, Social history, and Family History were reviewed and updated.  Review of Systems: Review of Systems  Constitutional: Negative.   HENT: Negative.    Eyes: Negative.   Respiratory: Negative.    Cardiovascular: Negative.   Gastrointestinal: Negative.   Genitourinary: Negative.   Musculoskeletal: Negative.   Skin: Negative.   Neurological: Negative.   Endo/Heme/Allergies: Negative.   Psychiatric/Behavioral: Negative.      Physical Exam:  vitals were not taken for this visit.   Physical Exam Vitals reviewed.  HENT:  Head: Normocephalic and atraumatic.  Eyes:     Pupils: Pupils are equal, round, and reactive to light.  Cardiovascular:     Rate and Rhythm: Normal rate and regular rhythm.     Heart sounds: Normal heart sounds.  Pulmonary:     Effort: Pulmonary effort is normal.     Breath sounds: Normal breath sounds.  Abdominal:     General: Bowel sounds are normal.     Palpations: Abdomen is soft.  Musculoskeletal:        General: No tenderness or deformity. Normal range of motion.     Cervical back: Normal range of motion.  Lymphadenopathy:     Cervical: No cervical adenopathy.  Skin:    General: Skin is warm and dry.     Findings: No erythema or rash.   Neurological:     Mental Status: He is alert and oriented to person, place, and time.  Psychiatric:        Behavior: Behavior normal.        Thought Content: Thought content normal.        Judgment: Judgment normal.      Lab Results  Component Value Date   WBC 10.3 02/21/2022   HGB 14.4 02/21/2022   HCT 44.0 02/21/2022   MCV 91.3 02/21/2022   PLT 235 02/21/2022     Chemistry      Component Value Date/Time   NA 139 11/14/2021 1023   NA 144 11/29/2016 1054   NA 139 12/29/2015 0954   K 4.5 11/14/2021 1023   K 4.2 11/29/2016 1054   K 4.1 12/29/2015 0954   CL 105 11/14/2021 1023   CL 102 11/29/2016 1054   CO2 29 11/14/2021 1023   CO2 30 11/29/2016 1054   CO2 26 12/29/2015 0954   BUN 23 11/14/2021 1023   BUN 22 11/29/2016 1054   BUN 20.0 12/29/2015 0954   CREATININE 1.24 11/14/2021 1023   CREATININE 1.2 11/29/2016 1054   CREATININE 1.3 12/29/2015 0954      Component Value Date/Time   CALCIUM 9.6 11/14/2021 1023   CALCIUM 9.3 11/29/2016 1054   CALCIUM 9.5 12/29/2015 0954   ALKPHOS 84 11/14/2021 1023   ALKPHOS 92 (H) 11/29/2016 1054   ALKPHOS 98 12/29/2015 0954   AST 15 11/14/2021 1023   AST 16 12/29/2015 0954   ALT 14 11/14/2021 1023   ALT 20 11/29/2016 1054   ALT 13 12/29/2015 0954   BILITOT 0.5 11/14/2021 1023   BILITOT 0.71 12/29/2015 0954      Impression and Plan: Mr. Igou is 82 year old gentleman. He has secondary polycythemia/erythrocytosis. He is JAK2 negative.  Thankfully, he does not need to be phlebotomized.  I am just happy about this.  Will try to help his wife out.  She has history of breast cancer.  Unfortunate, her oncologist retired.  She does not feel to see anybody.  We will see if we cannot take over for her care.  I will plan to get him back in another 3 months.   [name], MD 2/20/202412:13 PM

## 2022-02-21 NOTE — Telephone Encounter (Signed)
Please advise 

## 2022-02-22 ENCOUNTER — Encounter: Payer: Self-pay | Admitting: Hematology & Oncology

## 2022-02-24 NOTE — Telephone Encounter (Signed)
FYI

## 2022-03-03 ENCOUNTER — Other Ambulatory Visit: Payer: Self-pay | Admitting: Adult Health

## 2022-03-03 DIAGNOSIS — K219 Gastro-esophageal reflux disease without esophagitis: Secondary | ICD-10-CM

## 2022-03-03 DIAGNOSIS — M1000 Idiopathic gout, unspecified site: Secondary | ICD-10-CM

## 2022-03-03 DIAGNOSIS — E785 Hyperlipidemia, unspecified: Secondary | ICD-10-CM

## 2022-03-13 ENCOUNTER — Encounter: Payer: Self-pay | Admitting: Adult Health

## 2022-03-14 NOTE — Telephone Encounter (Signed)
Please advise 

## 2022-03-19 ENCOUNTER — Other Ambulatory Visit: Payer: Self-pay | Admitting: Adult Health

## 2022-03-19 DIAGNOSIS — I10 Essential (primary) hypertension: Secondary | ICD-10-CM

## 2022-03-20 ENCOUNTER — Encounter: Payer: Self-pay | Admitting: Adult Health

## 2022-03-21 ENCOUNTER — Encounter: Payer: Self-pay | Admitting: Adult Health

## 2022-03-21 ENCOUNTER — Ambulatory Visit (INDEPENDENT_AMBULATORY_CARE_PROVIDER_SITE_OTHER): Payer: Medicare HMO | Admitting: Adult Health

## 2022-03-21 VITALS — BP 120/80 | HR 70 | Temp 97.4°F | Ht 71.0 in | Wt 162.0 lb

## 2022-03-21 DIAGNOSIS — J302 Other seasonal allergic rhinitis: Secondary | ICD-10-CM | POA: Diagnosis not present

## 2022-03-21 DIAGNOSIS — E1169 Type 2 diabetes mellitus with other specified complication: Secondary | ICD-10-CM

## 2022-03-21 LAB — POCT GLYCOSYLATED HEMOGLOBIN (HGB A1C): Hemoglobin A1C: 8.1 % — AB (ref 4.0–5.6)

## 2022-03-21 MED ORDER — GLIPIZIDE 2.5 MG PO TABS: 2.5000 mg | ORAL_TABLET | Freq: Every day | ORAL | 0 refills | Status: DC

## 2022-03-21 NOTE — Progress Notes (Signed)
Subjective:    Patient ID: Alexander [name], male    DOB: 07-05-1940, 82 y.o.   MRN: [id number]  HPI 82 year old male who  has a past medical history of Arthritis, CAD (coronary artery disease), Diverticulosis of colon, GERD (gastroesophageal reflux disease), GI bleed, Gout, Hematuria, microscopic, Hemorrhoids, Hyperglycemia, Hyperlipidemia, Hypertension, Lumbar back pain, Microalbuminuria, Overweight(278.02), Polycythemia rubra vera ([place]), and Tubular adenoma of colon (07/2012).  He has a history of diabetes mellitus type 2 which has been mostly diet controlled.  In the past he has had intolerance to metformin.  He continues to check his blood sugars at home and has been having an average of 150 with some elevation into the 190's. No polyuria or polydipsia   Lab Results  Component Value Date   HGBA1C 6.6 (A) 12/14/2021    He also reports that over the last few days he has been experiencing nasal congestion, rhinorrhea, cough, and itchy watery eyes. When he uses his allergy nasal spray it helps with his symptoms    Review of Systems See HPI   Past Medical History:  Diagnosis Date   Arthritis    "minor; shoulders primarily" (05/20/2013)   CAD (coronary artery disease)    a. 05/2013 - Chest pain/unstable angina and dynamic EKG changes showing cath showing atherosclerotic coronary artery disease manifested as diffuse ectasia, normal EF.   Diverticulosis of colon    GERD (gastroesophageal reflux disease)    GI bleed    secondary to Aspirin   Gout    Hematuria, microscopic    Hemorrhoids    Hyperglycemia    Hyperlipidemia    Hypertension    Lumbar back pain    Microalbuminuria    Overweight(278.02)    Polycythemia rubra vera (HCC)    Tubular adenoma of colon 07/2012    Social History   Socioeconomic History   Marital status: Married    Spouse name: Not on file   Number of children: 3   Years of education: Not on file   Highest education level: 12th grade  Occupational  History   Occupation: retired Lobbyist: RETIRED  Tobacco Use   Smoking status: Never   Smokeless tobacco: Never   Tobacco comments:    NEVER USED TOBACCO  Vaping Use   Vaping Use: Never used  Substance and Sexual Activity   Alcohol use: No    Alcohol/week: 0.0 standard drinks of alcohol   Drug use: No   Sexual activity: Yes    Partners: Female  Other Topics Concern   Not on file  Social History Narrative   Marriedfor 30 years    Daughter, Higher education careers adviser (pediatrician--lives in [place], [place]), one in [place] - Engineer, water. One in [place] - Chief Strategy Officer          Social Determinants of Health   Financial Resource Strain: Low Risk  (09/09/2021)   Overall Financial Resource Strain (CARDIA)    Difficulty of Paying Living Expenses: Not hard at all  Food Insecurity: No Food Insecurity (09/09/2021)   Hunger Vital Sign    Worried About Running Out of Food in the Last Year: Never true    [place] in the Last Year: Never true  Transportation Needs: No Transportation Needs (09/09/2021)   PRAPARE - Hydrologist (Medical): No    Lack of Transportation (Non-Medical): No  Physical Activity: Sufficiently Active (09/09/2021)   Exercise Vital Sign    Days of Exercise per Week: 7 days  Minutes of Exercise per Session: 90 min  Stress: No Stress Concern Present (09/09/2021)   [place]    Feeling of Stress : Not at all  Social Connections: Socially Integrated (09/09/2021)   Social Connection and Isolation Panel [NHANES]    Frequency of Communication with Friends and Family: More than three times a week    Frequency of Social Gatherings with Friends and Family: More than three times a week    Attends Religious Services: More than 4 times per year    Active Member of [employer] or Organizations: Yes    Attends Music therapist: More than 4 times per year    Marital Status: Married  Arboriculturist Violence: Not At Risk (09/09/2021)   Humiliation, Afraid, Rape, and Kick questionnaire    Fear of Current or Ex-Partner: No    Emotionally Abused: No    Physically Abused: No    Sexually Abused: No    Past Surgical History:  Procedure Laterality Date   CARDIAC CATHETERIZATION  05/20/2013   CATARACT EXTRACTION W/ INTRAOCULAR LENS  IMPLANT, BILATERAL Bilateral 2008   CYSTECTOMY  ~ 2000   " SEBACEOUS cyst removed from between shoulder blades"   EXCISIONAL HEMORRHOIDECTOMY  1970's   INGUINAL HERNIA REPAIR Right ~ 2010   [place] N/A 05/20/2013   Procedure: LEFT HEART CATHETERIZATION WITH CORONARY ANGIOGRAM;  Surgeon: Peter M Martinique, MD;  Location: [facility] CATH LAB;  Service: Cardiovascular;  Laterality: N/A;   TONSILLECTOMY AND ADENOIDECTOMY  24's   VASECTOMY      Family History  Problem Relation Age of Onset   Colon cancer Mother        died at 52, colorectal   Diabetes Mother    Heart disease Father 26       MI   Diabetes Father    Stomach cancer Maternal Grandfather    Cancer Maternal Uncle     Allergies  Allergen Reactions   Metformin And Related Diarrhea   Metformin Hcl Diarrhea    Current Outpatient Medications on File Prior to Visit  Medication Sig Dispense Refill   Accu-Chek Softclix Lancets lancets Use to test blood glucose once daily 100 each 3   allopurinol (ZYLOPRIM) 100 MG tablet TAKE ONE TABLET BY MOUTH DAILY 365 tablet 0   amLODipine (NORVASC) 10 MG tablet TAKE ONE TABLET BY MOUTH DAILY 90 tablet 3   aspirin EC 81 MG tablet Take 1 tablet (81 mg total) by mouth daily. 90 tablet 3   Blood Glucose Monitoring Suppl (ACCU-CHEK AVIVA PLUS) w/Device KIT Use to test blood glucose once daily 1 kit 0   Coenzyme Q10 300 MG CAPS Take 1 capsule by mouth daily.      glucose blood (ACCU-CHEK AVIVA) test strip Use to test blood glucose once daily 100 each 3   hydrocortisone (ANUSOL-HC) 25 MG suppository Place 25 mg rectally daily  as needed for hemorrhoids or anal itching.     lisinopril (ZESTRIL) 10 MG tablet TAKE ONE TABLET BY MOUTH DAILY 90 tablet 3   pantoprazole (PROTONIX) 40 MG tablet TAKE ONE TABLET BY MOUTH DAILY 365 tablet 0   pravastatin (PRAVACHOL) 40 MG tablet TAKE ONE TABLET BY MOUTH DAILY 365 tablet 0   PREVIDENT 5000 ENAMEL PROTECT 1.1-5 % PSTE See admin instructions.     PROCTO-MED HC 2.5 % rectal cream APPLY 1 CREAM RECTALLY TWICE DAILY 90 g 3   No current facility-administered medications  on file prior to visit.    BP 120/80   Pulse 70   Temp (!) 97.4 F (36.3 C) (Oral)   Ht 5\' 11"  (1.803 m)   Wt 162 lb (73.5 kg)   SpO2 98%   BMI 22.59 kg/m       Objective:   Physical Exam Vitals and nursing note reviewed.  Constitutional:      Appearance: Normal appearance.  HENT:     Right Ear: Tympanic membrane, ear canal and external ear normal. There is no impacted cerumen.     Left Ear: Tympanic membrane, ear canal and external ear normal. There is no impacted cerumen.     Nose: Congestion and rhinorrhea present.     Mouth/Throat:     Mouth: Mucous membranes are moist.     Pharynx: Oropharynx is clear.  Cardiovascular:     Rate and Rhythm: Normal rate and regular rhythm.     Pulses: Normal pulses.     Heart sounds: Normal heart sounds.  Pulmonary:     Effort: Pulmonary effort is normal.     Breath sounds: Normal breath sounds.  Skin:    General: Skin is warm and dry.     Capillary Refill: Capillary refill takes less than 2 seconds.  Neurological:     General: No focal deficit present.     Mental Status: He is alert and oriented to person, place, and time.  Psychiatric:        Mood and Affect: Mood normal.        Behavior: Behavior normal.        Thought Content: Thought content normal.        Judgment: Judgment normal.       Assessment & Plan:  1. Type 2 diabetes mellitus with other specified complication, without long-term current use of insulin (HCC)  - POC HgB A1c- 8.1 - has  increased - will add glipzide 2.5 mg ER to regimen. - He will follow up with me via mychart to let me know what his blood sugars are after starting  - glipiZIDE 2.5 MG TABS; Take 2.5 mg by mouth daily.  Dispense: 90 tablet; Refill: 0  2. Seasonal allergies - Well controlled. No change in medication   [name], NP

## 2022-03-21 NOTE — Telephone Encounter (Signed)
FYI

## 2022-03-21 NOTE — Telephone Encounter (Signed)
Please advise 

## 2022-03-23 ENCOUNTER — Other Ambulatory Visit: Payer: Self-pay | Admitting: Adult Health

## 2022-03-23 DIAGNOSIS — K219 Gastro-esophageal reflux disease without esophagitis: Secondary | ICD-10-CM

## 2022-03-23 MED ORDER — PANTOPRAZOLE SODIUM 40 MG PO TBEC: 40.0000 mg | DELAYED_RELEASE_TABLET | Freq: Every day | ORAL | 0 refills | Status: DC

## 2022-03-28 ENCOUNTER — Encounter: Payer: Self-pay | Admitting: Hematology & Oncology

## 2022-03-28 ENCOUNTER — Encounter: Payer: Self-pay | Admitting: Cardiology

## 2022-04-13 ENCOUNTER — Telehealth: Payer: Self-pay | Admitting: Adult Health

## 2022-04-13 NOTE — Telephone Encounter (Signed)
PA needed for glucose blood (ACCU-CHEK AVIVA) test strip and asking for the PA to be marked urgent. Says patient received a bad batch of test strips and the company won't replace them

## 2022-04-17 NOTE — Telephone Encounter (Signed)
Insurance company calling to check on progress of this request.

## 2022-04-18 ENCOUNTER — Other Ambulatory Visit: Payer: Self-pay

## 2022-04-18 MED ORDER — GLUCOSE BLOOD VI STRP: ORAL_STRIP | 3 refills | Status: DC

## 2022-04-18 NOTE — Telephone Encounter (Signed)
Pt states he received a defective box of test strips and needs another Rx sent ASAP!!  Insurance company was also on the line, asking why it is taking so long?  Pt just started calling us on 04/13/22, regarding this issue.   Insurance company was informed NP does not work on Mondays.  Pt stated he contacted the test strip company and they are of no help.  Please advise.

## 2022-04-18 NOTE — Telephone Encounter (Signed)
Rx sent to pharmacy. [employer] company back but no answer.

## 2022-04-19 ENCOUNTER — Other Ambulatory Visit: Payer: Self-pay

## 2022-04-19 MED ORDER — EMBRACE PRO GLUCOSE TEST VI STRP: ORAL_STRIP | 12 refills | Status: AC

## 2022-04-19 MED ORDER — EMBRACE PRO GLUCOSE CONTROL VI LIQD: Status: DC

## 2022-05-17 ENCOUNTER — Ambulatory Visit: Payer: Medicare HMO | Admitting: Cardiology

## 2022-05-22 ENCOUNTER — Inpatient Hospital Stay: Payer: Medicare HMO

## 2022-05-22 ENCOUNTER — Ambulatory Visit: Payer: Medicare HMO | Admitting: Family

## 2022-05-24 ENCOUNTER — Inpatient Hospital Stay: Payer: Medicare HMO | Attending: Hematology & Oncology

## 2022-05-24 ENCOUNTER — Inpatient Hospital Stay: Payer: Medicare HMO

## 2022-05-24 ENCOUNTER — Inpatient Hospital Stay: Payer: Medicare HMO | Admitting: Hematology & Oncology

## 2022-05-24 DIAGNOSIS — Z7982 Long term (current) use of aspirin: Secondary | ICD-10-CM | POA: Diagnosis not present

## 2022-05-24 DIAGNOSIS — D751 Secondary polycythemia: Secondary | ICD-10-CM | POA: Insufficient documentation

## 2022-05-24 DIAGNOSIS — Z79899 Other long term (current) drug therapy: Secondary | ICD-10-CM | POA: Insufficient documentation

## 2022-05-24 DIAGNOSIS — D45 Polycythemia vera: Secondary | ICD-10-CM

## 2022-05-24 LAB — CBC WITH DIFFERENTIAL (CANCER CENTER ONLY)
Abs Immature Granulocytes: 0.02 10*3/uL (ref 0.00–0.07)
Basophils Absolute: 0.1 10*3/uL (ref 0.0–0.1)
Basophils Relative: 1 %
Eosinophils Absolute: 0.3 10*3/uL (ref 0.0–0.5)
Eosinophils Relative: 3 %
HCT: 43.4 % (ref 39.0–52.0)
Hemoglobin: 14.1 g/dL (ref 13.0–17.0)
Immature Granulocytes: 0 %
Lymphocytes Relative: 24 %
Lymphs Abs: 2.2 10*3/uL (ref 0.7–4.0)
MCH: 31.2 pg (ref 26.0–34.0)
MCHC: 32.5 g/dL (ref 30.0–36.0)
MCV: 96 fL (ref 80.0–100.0)
Monocytes Absolute: 1.1 10*3/uL — ABNORMAL HIGH (ref 0.1–1.0)
Monocytes Relative: 11 %
Neutro Abs: 5.8 10*3/uL (ref 1.7–7.7)
Neutrophils Relative %: 61 %
Platelet Count: 211 10*3/uL (ref 150–400)
RBC: 4.52 MIL/uL (ref 4.22–5.81)
RDW: 14.5 % (ref 11.5–15.5)
WBC Count: 9.4 10*3/uL (ref 4.0–10.5)
nRBC: 0 % (ref 0.0–0.2)

## 2022-05-24 LAB — LACTATE DEHYDROGENASE: LDH: 132 U/L (ref 98–192)

## 2022-05-24 LAB — CMP (CANCER CENTER ONLY)
ALT: 20 U/L (ref 0–44)
AST: 21 U/L (ref 15–41)
Albumin: 4.6 g/dL (ref 3.5–5.0)
Alkaline Phosphatase: 73 U/L (ref 38–126)
Anion gap: 11 (ref 5–15)
BUN: 19 mg/dL (ref 8–23)
CO2: 26 mmol/L (ref 22–32)
Calcium: 10.2 mg/dL (ref 8.9–10.3)
Chloride: 102 mmol/L (ref 98–111)
Creatinine: 1.27 mg/dL — ABNORMAL HIGH (ref 0.61–1.24)
GFR, Estimated: 56 mL/min — ABNORMAL LOW (ref >60)
Glucose, Bld: 115 mg/dL — ABNORMAL HIGH (ref 70–99)
Potassium: 4.6 mmol/L (ref 3.5–5.1)
Sodium: 139 mmol/L (ref 135–145)
Total Bilirubin: 0.8 mg/dL (ref 0.3–1.2)
Total Protein: 7 g/dL (ref 6.5–8.1)

## 2022-05-24 LAB — IRON AND IRON BINDING CAPACITY (CC-WL,HP ONLY)
Iron: 85 ug/dL (ref 45–182)
Saturation Ratios: 23 % (ref 17.9–39.5)
TIBC: 377 ug/dL (ref 250–450)
UIBC: 292 ug/dL (ref 117–376)

## 2022-05-24 LAB — FERRITIN: Ferritin: 36 ng/mL (ref 24–336)

## 2022-05-25 ENCOUNTER — Telehealth: Payer: Self-pay

## 2022-05-25 NOTE — Telephone Encounter (Signed)
-----   Message from Josph Macho, MD sent at 05/24/2022  5:03 PM EDT ----- Call let him know that the labs look great.  He does not need to be phlebotomized.  His potassium is 4.6.

## 2022-05-25 NOTE — Telephone Encounter (Signed)
Advised via MyChart.

## 2022-05-30 ENCOUNTER — Other Ambulatory Visit: Payer: Self-pay

## 2022-05-30 ENCOUNTER — Encounter: Payer: Self-pay | Admitting: Hematology & Oncology

## 2022-05-30 ENCOUNTER — Inpatient Hospital Stay (HOSPITAL_BASED_OUTPATIENT_CLINIC_OR_DEPARTMENT_OTHER): Payer: Medicare HMO | Admitting: Hematology & Oncology

## 2022-05-30 VITALS — BP 127/78 | HR 55 | Temp 97.5°F | Resp 18 | Ht 71.0 in | Wt 157.0 lb

## 2022-05-30 DIAGNOSIS — D45 Polycythemia vera: Secondary | ICD-10-CM

## 2022-05-30 DIAGNOSIS — D751 Secondary polycythemia: Secondary | ICD-10-CM | POA: Diagnosis not present

## 2022-05-30 NOTE — Progress Notes (Signed)
Hematology and Oncology Follow Up Visit  Alexander [name] [id number] 14-Nov-1940 82 y.o. 05/30/2022   Principle Diagnosis:  Secondary Polycythemia/Erythrocytosis-JAK2 negative  Current Therapy:   Phlebotomy to maintain hematocrit below 45% - 12/29/2015 Aspirin 81 mg by mouth daily     Interim History:  [name] is in for a followup.  He looks quite well.  He is lost some weight.  He is exercising.  He comes in with his wife.  She had her 80th birthday over the [facility] Day weekend.  This was a wonderful event.  There has been no problems with nausea or vomiting.  He has had no problems with chest pain.  He has had no cough or shortness of breath.  He has had no fever.  There is been no problems with rashes.  He has had no bleeding.  Overall, I would have to say that his performance status is quite good at [name].    Medications:  Current Outpatient Medications:    Blood Glucose Calibration (EMBRACE PRO GLUCOSE CONTROL) LIQD, Use to check blood glucose TID, Disp: 1 each, Rfl:    glucose blood (EMBRACE PRO GLUCOSE TEST) test strip, Use as instructed, Disp: 100 each, Rfl: 12   Accu-Chek Softclix Lancets lancets, Use to test blood glucose once daily, Disp: 100 each, Rfl: 3   allopurinol (ZYLOPRIM) 100 MG tablet, TAKE ONE TABLET BY MOUTH DAILY, Disp: 365 tablet, Rfl: 0   amLODipine (NORVASC) 10 MG tablet, TAKE ONE TABLET BY MOUTH DAILY, Disp: 90 tablet, Rfl: 3   aspirin EC 81 MG tablet, Take 1 tablet (81 mg total) by mouth daily., Disp: 90 tablet, Rfl: 3   Blood Glucose Monitoring Suppl (ACCU-CHEK AVIVA PLUS) w/Device KIT, Use to test blood glucose once daily, Disp: 1 kit, Rfl: 0   Coenzyme Q10 300 MG CAPS, Take 1 capsule by mouth daily. , Disp: , Rfl:    glipiZIDE 2.5 MG TABS, Take 2.5 mg by mouth daily., Disp: 90 tablet, Rfl: 0   hydrocortisone (ANUSOL-HC) 25 MG suppository, Place 25 mg rectally daily as needed for hemorrhoids or anal itching., Disp: , Rfl:    lisinopril (ZESTRIL)  10 MG tablet, TAKE ONE TABLET BY MOUTH DAILY, Disp: 90 tablet, Rfl: 3   pantoprazole (PROTONIX) 40 MG tablet, Take 1 tablet (40 mg total) by mouth daily., Disp: 365 tablet, Rfl: 0   pravastatin (PRAVACHOL) 40 MG tablet, TAKE ONE TABLET BY MOUTH DAILY, Disp: 365 tablet, Rfl: 0   PREVIDENT 5000 ENAMEL PROTECT 1.1-5 % PSTE, See admin instructions., Disp: , Rfl:    PROCTO-MED HC 2.5 % rectal cream, APPLY 1 CREAM RECTALLY TWICE DAILY, Disp: 90 g, Rfl: 3  Allergies:  Allergies  Allergen Reactions   Metformin And Related Diarrhea   Metformin Hcl Diarrhea    Past Medical History, Surgical history, Social history, and Family History were reviewed and updated.  Review of Systems: Review of Systems  Constitutional: Negative.   HENT: Negative.    Eyes: Negative.   Respiratory: Negative.    Cardiovascular: Negative.   Gastrointestinal: Negative.   Genitourinary: Negative.   Musculoskeletal: Negative.   Skin: Negative.   Neurological: Negative.   Endo/Heme/Allergies: Negative.   Psychiatric/Behavioral: Negative.      Physical Exam: Vital signs show temperature of 97.5.  Pulse 55.  Blood pressure 127/78.  Weight is 157 pounds.  Physical Exam Vitals reviewed.  HENT:     Head: Normocephalic and atraumatic.  Eyes:     Pupils: Pupils are equal,  round, and reactive to light.  Cardiovascular:     Rate and Rhythm: Normal rate and regular rhythm.     Heart sounds: Normal heart sounds.  Pulmonary:     Effort: Pulmonary effort is normal.     Breath sounds: Normal breath sounds.  Abdominal:     General: Bowel sounds are normal.     Palpations: Abdomen is soft.  Musculoskeletal:        General: No tenderness or deformity. Normal range of motion.     Cervical back: Normal range of motion.  Lymphadenopathy:     Cervical: No cervical adenopathy.  Skin:    General: Skin is warm and dry.     Findings: No erythema or rash.  Neurological:     Mental Status: He is alert and oriented to person,  place, and time.  Psychiatric:        Behavior: Behavior normal.        Thought Content: Thought content normal.        Judgment: Judgment normal.     Lab Results  Component Value Date   WBC 9.4 05/24/2022   HGB 14.1 05/24/2022   HCT 43.4 05/24/2022   MCV 96.0 05/24/2022   PLT 211 05/24/2022     Chemistry      Component Value Date/Time   NA 139 05/24/2022 1213   NA 144 11/29/2016 1054   NA 139 12/29/2015 0954   K 4.6 05/24/2022 1213   K 4.2 11/29/2016 1054   K 4.1 12/29/2015 0954   CL 102 05/24/2022 1213   CL 102 11/29/2016 1054   CO2 26 05/24/2022 1213   CO2 30 11/29/2016 1054   CO2 26 12/29/2015 0954   BUN 19 05/24/2022 1213   BUN 22 11/29/2016 1054   BUN 20.0 12/29/2015 0954   CREATININE 1.27 (H) 05/24/2022 1213   CREATININE 1.2 11/29/2016 1054   CREATININE 1.3 12/29/2015 0954      Component Value Date/Time   CALCIUM 10.2 05/24/2022 1213   CALCIUM 9.3 11/29/2016 1054   CALCIUM 9.5 12/29/2015 0954   ALKPHOS 73 05/24/2022 1213   ALKPHOS 92 (H) 11/29/2016 1054   ALKPHOS 98 12/29/2015 0954   AST 21 05/24/2022 1213   AST 16 12/29/2015 0954   ALT 20 05/24/2022 1213   ALT 20 11/29/2016 1054   ALT 13 12/29/2015 0954   BILITOT 0.8 05/24/2022 1213   BILITOT 0.71 12/29/2015 0954      Impression and Plan: Alexander [name] is 82 year old gentleman. He has secondary polycythemia/erythrocytosis. He is JAK2 negative.  Thankfully, he does not need to be phlebotomized.  I am just happy about this.  Again, everything is in quite good with him.  At this point, I really do not think that we have to get him back over the Summer.  I will try to get him back after Labor Day weekend.  I am glad to see that his wife seems to be doing little bit better.  Alexander Macho, MD 5/28/20241:58 PM

## 2022-06-01 NOTE — Progress Notes (Signed)
Office Visit    Patient Name: Alexander [name] Date of Encounter: 06/01/2022  Primary Care Provider:  Shirline Frees, NP Primary Cardiologist:  Donato Schultz, MD Primary Electrophysiologist: None   Past Medical History    Past Medical History:  Diagnosis Date   Arthritis    "minor; shoulders primarily" (05/20/2013)   CAD (coronary artery disease)    a. 05/2013 - Chest pain/unstable angina and dynamic EKG changes showing cath showing atherosclerotic coronary artery disease manifested as diffuse ectasia, normal EF.   Diverticulosis of colon    GERD (gastroesophageal reflux disease)    GI bleed    secondary to Aspirin   Gout    Hematuria, microscopic    Hemorrhoids    Hyperglycemia    Hyperlipidemia    Hypertension    Lumbar back pain    Microalbuminuria    Overweight(278.02)    Polycythemia rubra vera (HCC)    Tubular adenoma of colon 07/2012   Past Surgical History:  Procedure Laterality Date   CARDIAC CATHETERIZATION  05/20/2013   CATARACT EXTRACTION W/ INTRAOCULAR LENS  IMPLANT, BILATERAL Bilateral 2008   CYSTECTOMY  ~ 2000   " SEBACEOUS cyst removed from between shoulder blades"   EXCISIONAL HEMORRHOIDECTOMY  1970's   INGUINAL HERNIA REPAIR Right ~ 2010   LEFT HEART CATHETERIZATION WITH CORONARY ANGIOGRAM N/A 05/20/2013   Procedure: LEFT HEART CATHETERIZATION WITH CORONARY ANGIOGRAM;  Surgeon: Peter M Swaziland, MD;  Location: [facility] CATH LAB;  Service: Cardiovascular;  Laterality: N/A;   TONSILLECTOMY AND ADENOIDECTOMY  1940's   VASECTOMY      Allergies  Allergies  Allergen Reactions   Metformin And Related Diarrhea   Metformin Hcl Diarrhea     History of Present Illness    Alexander [name]  is a 82 year old male with a PMH of nonobstructive CAD, GERD, HLD, HTN, polycythemia vera who presents today for 1 year follow-up.  [name] was initially seen in 2010 for evaluation of near syncope.  He wore an event monitor and was found to be orthostatic mediated  with adjustments to his Benicar.  He completed a nuclear stress test in 2004 that was normal.  He presented to the ED in 2015 with complaints of chest discomfort.  He underwent left heart cath that showed atherosclerotic CAD manifested as diffuse ectasia but no significant obstructive disease was noted. Had 30% left main plaque.  He was treated with medical therapy.  He had a 2D echo completed 10/2008 that showed EF of 60 to 65% with grade 1 DD. He was last seen on 04/25/2021 for annual follow-up. He was reportedly doing well with controlled blood pressure and no new cardiac complaints.  [name] presents today for 1 year follow-up alone.  Since last being seen in the office patient reports he has been doing well and has no new cardiac complaints.  He has been staying quite active and swims 2 to 3 hours a day.  He has maintained good control of his blood pressure which is 118/60 today.  He has been working on lowering his A1c which was 8 in March of this year.  He is eating less carbs and consuming low glycemic foods.  During today's visit we discussed possibly decreasing his BP medications with his significant weight loss.  He is planning to follow-up with his PCP regarding adjustments to his current regimen.  Patient denies chest pain, palpitations, dyspnea, PND, orthopnea, nausea, vomiting, dizziness, syncope, edema, weight gain, or early satiety.  Home Medications    Current Outpatient Medications  Medication Sig Dispense Refill   Blood Glucose Calibration (EMBRACE PRO GLUCOSE CONTROL) LIQD Use to check blood glucose TID 1 each    glucose blood (EMBRACE PRO GLUCOSE TEST) test strip Use as instructed 100 each 12   Accu-Chek Softclix Lancets lancets Use to test blood glucose once daily 100 each 3   allopurinol (ZYLOPRIM) 100 MG tablet TAKE ONE TABLET BY MOUTH DAILY 365 tablet 0   amLODipine (NORVASC) 10 MG tablet TAKE ONE TABLET BY MOUTH DAILY 90 tablet 3   aspirin EC 81 MG tablet Take 1 tablet (81  mg total) by mouth daily. 90 tablet 3   Blood Glucose Monitoring Suppl (ACCU-CHEK AVIVA PLUS) w/Device KIT Use to test blood glucose once daily 1 kit 0   Coenzyme Q10 300 MG CAPS Take 1 capsule by mouth daily.      glipiZIDE 2.5 MG TABS Take 2.5 mg by mouth daily. 90 tablet 0   hydrocortisone (ANUSOL-HC) 25 MG suppository Place 25 mg rectally daily as needed for hemorrhoids or anal itching.     lisinopril (ZESTRIL) 10 MG tablet TAKE ONE TABLET BY MOUTH DAILY 90 tablet 3   pantoprazole (PROTONIX) 40 MG tablet Take 1 tablet (40 mg total) by mouth daily. 365 tablet 0   pravastatin (PRAVACHOL) 40 MG tablet TAKE ONE TABLET BY MOUTH DAILY 365 tablet 0   PREVIDENT 5000 ENAMEL PROTECT 1.1-5 % PSTE See admin instructions.     PROCTO-MED HC 2.5 % rectal cream APPLY 1 CREAM RECTALLY TWICE DAILY 90 g 3   No current facility-administered medications for this visit.     Review of Systems  Please see the history of present illness.     All other systems reviewed and are otherwise negative except as noted above.  Physical Exam    Wt Readings from Last 3 Encounters:  05/30/22 157 lb (71.2 kg)  03/21/22 162 lb (73.5 kg)  02/21/22 165 lb 1.3 oz (74.9 kg)   UJ:WJXBJ were no vitals filed for this visit.,There is no height or weight on file to calculate BMI.  Constitutional:      Appearance: Healthy appearance. Not in distress.  Neck:     Vascular: JVD normal.  Pulmonary:     Effort: Pulmonary effort is normal.     Breath sounds: No wheezing. No rales. Diminished in the bases Cardiovascular:     Normal rate. Regular rhythm. Normal S1. Normal S2.      Murmurs: There is no murmur.  Edema:    Peripheral edema absent.  Abdominal:     Palpations: Abdomen is soft non tender. There is no hepatomegaly.  Skin:    General: Skin is warm and dry.  Neurological:     General: No focal deficit present.     Mental Status: Alert and oriented to person, place and time.     Cranial Nerves: Cranial nerves are  intact.  EKG/LABS/ Recent Cardiac Studies    ECG personally reviewed by me today -sinus bradycardia with rate of 51 bpm and TWI in lead III consistent with previous EKG.  Cardiac Studies & Procedures                  Lab Results  Component Value Date   WBC 9.4 05/24/2022   HGB 14.1 05/24/2022   HCT 43.4 05/24/2022   MCV 96.0 05/24/2022   PLT 211 05/24/2022   Lab Results  Component Value Date   CREATININE 1.27 (H)  05/24/2022   BUN 19 05/24/2022   NA 139 05/24/2022   K 4.6 05/24/2022   CL 102 05/24/2022   CO2 26 05/24/2022   Lab Results  Component Value Date   ALT 20 05/24/2022   AST 21 05/24/2022   ALKPHOS 73 05/24/2022   BILITOT 0.8 05/24/2022   Lab Results  Component Value Date   CHOL 170 03/16/2021   HDL 47.70 03/16/2021   LDLCALC 85 03/16/2021   TRIG 183.0 (H) 03/16/2021   CHOLHDL 4 03/16/2021    Lab Results  Component Value Date   HGBA1C 8.1 (A) 03/21/2022     Assessment & Plan    1.  Coronary artery disease: -s/p LHC completed in 2015 showing nonobstructive CAD with 30% left main -Today patient reports no recurrence of chest pain or cardiac complaints since previous visit. -He has been staying active and following a heart healthy diet and lifestyle. -Continue GDMT with ASA 81 mg, Pravachol 40 mg daily  2.  Essential hypertension: -Patient's blood pressure today was well-controlled at 118/60 -Continue Zestril 2 mg daily and Norvasc 10 mg daily -Patient is interested in reducing BP medications and will follow-up with his PCP regarding adjustments.  3.  Hyperlipidemia: -Patient's LDL cholesterol was 85 slightly above goal of less than 70 -Continue Pravachol 40 mg daily  4.  Polycythemia vera: -Patient currently followed by hematology -He is scheduled to have ferritin and iron levels completed in the future. -He wishes to be able to donate blood however is not eligible based on current guidelines.   Disposition: Follow-up with Donato Schultz, MD or  APP in 12 months    Medication Adjustments/Labs and Tests Ordered: Current medicines are reviewed at length with the patient today.  Concerns regarding medicines are outlined above.   Signed, Napoleon Form, Leodis Rains, NP 06/01/2022, 1:03 PM [facility]

## 2022-06-02 ENCOUNTER — Encounter: Payer: Self-pay | Admitting: Nurse Practitioner

## 2022-06-02 ENCOUNTER — Ambulatory Visit: Payer: Medicare HMO | Attending: Cardiology | Admitting: Nurse Practitioner

## 2022-06-02 VITALS — BP 118/60 | HR 51 | Ht 71.0 in | Wt 159.8 lb

## 2022-06-02 DIAGNOSIS — I2510 Atherosclerotic heart disease of native coronary artery without angina pectoris: Secondary | ICD-10-CM | POA: Diagnosis not present

## 2022-06-02 DIAGNOSIS — I10 Essential (primary) hypertension: Secondary | ICD-10-CM

## 2022-06-02 DIAGNOSIS — D45 Polycythemia vera: Secondary | ICD-10-CM | POA: Diagnosis not present

## 2022-06-02 DIAGNOSIS — E785 Hyperlipidemia, unspecified: Secondary | ICD-10-CM

## 2022-06-02 DIAGNOSIS — I2583 Coronary atherosclerosis due to lipid rich plaque: Secondary | ICD-10-CM

## 2022-06-02 NOTE — Patient Instructions (Signed)
Medication Instructions:  Your physician recommends that you continue on your current medications as directed. Please refer to the Current Medication list given to you today. *If you need a refill on your cardiac medications before your next appointment, please call your pharmacy*   Lab Work: None ordered   Testing/Procedures: None Ordered   Follow-Up: At [facility], you and your health needs are our priority.  As part of our continuing mission to provide you with exceptional heart care, we have created designated Provider Care Teams.  These Care Teams include your primary Cardiologist (physician) and Advanced Practice Providers (APPs -  Physician Assistants and Nurse Practitioners) who all work together to provide you with the care you need, when you need it.  We recommend signing up for the patient portal called "MyChart".  Sign up information is provided on this After Visit Summary.  MyChart is used to connect with patients for Virtual Visits (Telemedicine).  Patients are able to view lab/test results, encounter notes, upcoming appointments, etc.  Non-urgent messages can be sent to your provider as well.   To learn more about what you can do with MyChart, go to [email or web address].    Your next appointment:   12 month(s)  Provider:   Donato Schultz, MD     Other Instructions

## 2022-06-15 ENCOUNTER — Other Ambulatory Visit: Payer: Self-pay | Admitting: Adult Health

## 2022-06-15 DIAGNOSIS — E1169 Type 2 diabetes mellitus with other specified complication: Secondary | ICD-10-CM

## 2022-06-15 NOTE — Telephone Encounter (Signed)
Pt has a dm appt 06/16/2022. Will wait to refill in case there are any changes to medications.

## 2022-06-16 ENCOUNTER — Encounter: Payer: Self-pay | Admitting: Adult Health

## 2022-06-16 ENCOUNTER — Ambulatory Visit (INDEPENDENT_AMBULATORY_CARE_PROVIDER_SITE_OTHER): Payer: Medicare HMO | Admitting: Adult Health

## 2022-06-16 VITALS — BP 122/80 | HR 59 | Temp 97.5°F | Ht 71.0 in | Wt 155.0 lb

## 2022-06-16 DIAGNOSIS — Z7984 Long term (current) use of oral hypoglycemic drugs: Secondary | ICD-10-CM | POA: Diagnosis not present

## 2022-06-16 DIAGNOSIS — I10 Essential (primary) hypertension: Secondary | ICD-10-CM

## 2022-06-16 DIAGNOSIS — E1169 Type 2 diabetes mellitus with other specified complication: Secondary | ICD-10-CM | POA: Diagnosis not present

## 2022-06-16 LAB — POCT GLYCOSYLATED HEMOGLOBIN (HGB A1C): Hemoglobin A1C: 6.4 % — AB (ref 4.0–5.6)

## 2022-06-16 MED ORDER — GLIPIZIDE 2.5 MG PO TABS: 2.5000 mg | ORAL_TABLET | Freq: Every day | ORAL | 0 refills | Status: DC

## 2022-06-16 NOTE — Telephone Encounter (Signed)
FYI

## 2022-06-16 NOTE — Patient Instructions (Signed)
Health Maintenance Due  Topic Date Due   Diabetic kidney evaluation - Urine ACR  06/10/2008   Colonoscopy  07/16/2017   DTaP/Tdap/Td (2 - Td or Tdap) 01/16/2022   COVID-19 Vaccine (9 - 2023-24 season) 05/10/2022      Row Labels 01/27/2022   11:02 AM 10/06/2021    2:24 PM 09/09/2021    3:51 PM  Depression screen PHQ 2/9   Section Header. No data exists in this row.     Decreased Interest   0 0 0  Down, Depressed, Hopeless   0 0 0  PHQ - 2 Score   0 0 0  Altered sleeping   0    Tired, decreased energy   0    Change in appetite   0    Feeling bad or failure about yourself    0    Trouble concentrating   0    Moving slowly or fidgety/restless   0    Suicidal thoughts   0    PHQ-9 Score   0    Difficult doing work/chores   Not difficult at all

## 2022-06-16 NOTE — Progress Notes (Signed)
Subjective:    Patient ID: Alexander [name], male    DOB: 19-Jan-1940, 82 y.o.   MRN: [id number]  HPI 82 year old male who  has a past medical history of Arthritis, CAD (coronary artery disease), Diverticulosis of colon, GERD (gastroesophageal reflux disease), GI bleed, Gout, Hematuria, microscopic, Hemorrhoids, Hyperglycemia, Hyperlipidemia, Hypertension, Lumbar back pain, Microalbuminuria, Overweight(278.02), Polycythemia rubra vera (HCC), and Tubular adenoma of colon (07/2012).  He has a history of diabetes mellitus type 2 which has been mostly diet controlled but was placed on Glipizide 2.5 mg daily three months ago for increasing blood sugars. He is intolerant to Metformin. When he was placed on glipizide his blood sugars were averaging 150 with elevations into the 190's despite being active and eating healthy. His A1c was 8.1 at this time   Since starting Glipizide his blood sugars have been in the one teens to 120's. He has not had any hypoglycemic episodes. He is swimming a lot and eating a high fiber diet.   Wt Readings from Last 3 Encounters:  06/16/22 155 lb (70.3 kg)  06/02/22 159 lb 12.8 oz (72.5 kg)  05/30/22 157 lb (71.2 kg)   Hypertension - Managed with Norvasc 10 mg and lisinopril 10 mg aily. He denies dizziness, lightheadedness, blurred vision, or headaches.   BP Readings from Last 3 Encounters:  06/16/22 122/80  06/02/22 118/60  05/30/22 127/78    Review of Systems See HPI   Past Medical History:  Diagnosis Date   Arthritis    "minor; shoulders primarily" (05/20/2013)   CAD (coronary artery disease)    a. 05/2013 - Chest pain/unstable angina and dynamic EKG changes showing cath showing atherosclerotic coronary artery disease manifested as diffuse ectasia, normal EF.   Diverticulosis of colon    GERD (gastroesophageal reflux disease)    GI bleed    secondary to Aspirin   Gout    Hematuria, microscopic    Hemorrhoids    Hyperglycemia    Hyperlipidemia     Hypertension    Lumbar back pain    Microalbuminuria    Overweight(278.02)    Polycythemia rubra vera (HCC)    Tubular adenoma of colon 07/2012    Social History   Socioeconomic History   Marital status: Married    Spouse name: Not on file   Number of children: 3   Years of education: Not on file   Highest education level: Some college, no degree  Occupational History   Occupation: retired Garment/textile technologist: RETIRED  Tobacco Use   Smoking status: Never   Smokeless tobacco: Never   Tobacco comments:    NEVER USED TOBACCO  Vaping Use   Vaping Use: Never used  Substance and Sexual Activity   Alcohol use: No    Alcohol/week: 0.0 standard drinks of alcohol   Drug use: No   Sexual activity: Yes    Partners: Female  Other Topics Concern   Not on file  Social History Narrative   Marriedfor 52 years    Daughter, Physicist, medical (pediatrician--lives in [place], [place]), one in [place] - Warden/ranger. One in [place] - Chartered loss adjuster          Social Determinants of Health   Financial Resource Strain: Low Risk  (06/11/2022)   Overall Financial Resource Strain (CARDIA)    Difficulty of Paying Living Expenses: Not hard at all  Food Insecurity: No Food Insecurity (06/11/2022)   Hunger Vital Sign    Worried About Running Out of Food in the Last  Year: Never true    Ran Out of Food in the Last Year: Never true  Transportation Needs: No Transportation Needs (06/11/2022)   PRAPARE - Administrator, Civil Service (Medical): No    Lack of Transportation (Non-Medical): No  Physical Activity: Sufficiently Active (06/11/2022)   Exercise Vital Sign    Days of Exercise per Week: 7 days    Minutes of Exercise per Session: 90 min  Stress: No Stress Concern Present (06/11/2022)   [employer] of Occupational Health - Occupational Stress Questionnaire    Feeling of Stress : Not at all  Social Connections: Socially Integrated (06/11/2022)   Social Connection and Isolation Panel [NHANES]    Frequency of  Communication with Friends and Family: More than three times a week    Frequency of Social Gatherings with Friends and Family: Twice a week    Attends Religious Services: More than 4 times per year    Active Member of [employer] or Organizations: Yes    Attends Engineer, structural: More than 4 times per year    Marital Status: Married  Catering manager Violence: Not At Risk (09/09/2021)   Humiliation, Afraid, Rape, and Kick questionnaire    Fear of Current or Ex-Partner: No    Emotionally Abused: No    Physically Abused: No    Sexually Abused: No    Past Surgical History:  Procedure Laterality Date   CARDIAC CATHETERIZATION  05/20/2013   CATARACT EXTRACTION W/ INTRAOCULAR LENS  IMPLANT, BILATERAL Bilateral 2008   CYSTECTOMY  ~ 2000   " SEBACEOUS cyst removed from between shoulder blades"   EXCISIONAL HEMORRHOIDECTOMY  1970's   INGUINAL HERNIA REPAIR Right ~ 2010   LEFT HEART CATHETERIZATION WITH CORONARY ANGIOGRAM N/A 05/20/2013   Procedure: LEFT HEART CATHETERIZATION WITH CORONARY ANGIOGRAM;  Surgeon: Peter M Swaziland, MD;  Location: [facility] CATH LAB;  Service: Cardiovascular;  Laterality: N/A;   TONSILLECTOMY AND ADENOIDECTOMY  1940's   VASECTOMY      Family History  Problem Relation Age of Onset   Colon cancer Mother        died at 85, colorectal   Diabetes Mother    Heart disease Father 1       MI   Diabetes Father    Stomach cancer Maternal Grandfather    Cancer Maternal Uncle     Allergies  Allergen Reactions   Metformin And Related Diarrhea   Metformin Hcl Diarrhea    Current Outpatient Medications on File Prior to Visit  Medication Sig Dispense Refill   Accu-Chek Softclix Lancets lancets Use to test blood glucose once daily 100 each 3   allopurinol (ZYLOPRIM) 100 MG tablet TAKE ONE TABLET BY MOUTH DAILY 365 tablet 0   amLODipine (NORVASC) 10 MG tablet TAKE ONE TABLET BY MOUTH DAILY 90 tablet 3   aspirin EC 81 MG tablet Take 1 tablet (81 mg total) by mouth daily.  90 tablet 3   Blood Glucose Calibration (EMBRACE PRO GLUCOSE CONTROL) LIQD Use to check blood glucose TID 1 each    Blood Glucose Monitoring Suppl (ACCU-CHEK AVIVA PLUS) w/Device KIT Use to test blood glucose once daily 1 kit 0   Coenzyme Q10 300 MG CAPS Take 1 capsule by mouth daily.      glipiZIDE 2.5 MG TABS Take 2.5 mg by mouth daily. 90 tablet 0   glucose blood (EMBRACE PRO GLUCOSE TEST) test strip Use as instructed 100 each 12   hydrocortisone (ANUSOL-HC) 25 MG suppository Place 25  mg rectally daily as needed for hemorrhoids or anal itching.     lisinopril (ZESTRIL) 10 MG tablet TAKE ONE TABLET BY MOUTH DAILY 90 tablet 3   pantoprazole (PROTONIX) 40 MG tablet Take 1 tablet (40 mg total) by mouth daily. 365 tablet 0   pravastatin (PRAVACHOL) 40 MG tablet TAKE ONE TABLET BY MOUTH DAILY 365 tablet 0   PREVIDENT 5000 ENAMEL PROTECT 1.1-5 % PSTE See admin instructions.     PROCTO-MED HC 2.5 % rectal cream APPLY 1 CREAM RECTALLY TWICE DAILY 90 g 3   No current facility-administered medications on file prior to visit.    BP 122/80   Pulse (!) 59   Temp (!) 97.5 F (36.4 C) (Oral)   Ht 5\' 11"  (1.803 m)   Wt 155 lb (70.3 kg)   SpO2 96%   BMI 21.62 kg/m       Objective:   Physical Exam Vitals and nursing note reviewed.  Constitutional:      Appearance: Normal appearance.  Cardiovascular:     Rate and Rhythm: Normal rate and regular rhythm.     Pulses: Normal pulses.     Heart sounds: Normal heart sounds.  Musculoskeletal:        General: Normal range of motion.  Skin:    General: Skin is warm and dry.  Neurological:     General: No focal deficit present.     Mental Status: He is alert and oriented to person, place, and time.  Psychiatric:        Mood and Affect: Mood normal.        Behavior: Behavior normal.        Thought Content: Thought content normal.        Judgment: Judgment normal.        Assessment & Plan:  1. Type 2 diabetes mellitus with other specified  complication, without long-term current use of insulin (HCC)  - POC HgB A1c- 6.4 - has improved. No change in therapy. Encouraged to increase protein since he is exercising so much  - Let me know if he has any hypoglycemic episodes - Follow up in 3 months  - glipiZIDE 2.5 MG TABS; Take 2.5 mg by mouth daily.  Dispense: 90 tablet; Refill: 0  2. Essential hypertension - Well controlled. No change in medication   Shirline Frees, NP

## 2022-07-23 ENCOUNTER — Encounter: Payer: Self-pay | Admitting: Adult Health

## 2022-07-25 ENCOUNTER — Encounter: Payer: Self-pay | Admitting: Adult Health

## 2022-07-26 NOTE — Telephone Encounter (Signed)
Please advise 

## 2022-08-02 ENCOUNTER — Ambulatory Visit: Payer: Medicare HMO | Admitting: Adult Health

## 2022-08-03 ENCOUNTER — Encounter: Payer: Self-pay | Admitting: Adult Health

## 2022-08-03 ENCOUNTER — Ambulatory Visit (INDEPENDENT_AMBULATORY_CARE_PROVIDER_SITE_OTHER): Payer: Medicare HMO | Admitting: Adult Health

## 2022-08-03 VITALS — BP 110/62 | HR 70 | Temp 97.5°F | Ht 71.0 in | Wt 148.0 lb

## 2022-08-03 DIAGNOSIS — Z7984 Long term (current) use of oral hypoglycemic drugs: Secondary | ICD-10-CM

## 2022-08-03 DIAGNOSIS — E1169 Type 2 diabetes mellitus with other specified complication: Secondary | ICD-10-CM

## 2022-08-03 DIAGNOSIS — Z9189 Other specified personal risk factors, not elsewhere classified: Secondary | ICD-10-CM | POA: Diagnosis not present

## 2022-08-03 NOTE — Progress Notes (Signed)
Subjective:    Patient ID: Alexander [name], male    DOB: 1940/02/21, 82 y.o.   MRN: [id number]  HPI 82 year old male who  has a past medical history of Arthritis, CAD (coronary artery disease), Diverticulosis of colon, GERD (gastroesophageal reflux disease), GI bleed, Gout, Hematuria, microscopic, Hemorrhoids, Hyperglycemia, Hyperlipidemia, Hypertension, Lumbar back pain, Microalbuminuria, Overweight(278.02), Polycythemia rubra vera (HCC), and Tubular adenoma of colon (07/2012).  He presents to the office today for weight loss. He is a diabetic and was previously on glipizide 2.5 mg daily until I had him stop the medication a week ago due to weight loss and low blood sugar numbers. Up until about two weeks ago he was swimming laps in his pool for about 2 hours a day. He has since cut that back to 45-60 minutes a day due to weight loss. He does eat a healthy diet which is high in fiber. He is snacking throughout the day.   He denies CP/SOB/Abdominal pain/feeling ill.   Wt Readings from Last 3 Encounters:  08/03/22 148 lb (67.1 kg)  06/16/22 155 lb (70.3 kg)  06/02/22 159 lb 12.8 oz (72.5 kg)      Review of Systems See HPI   Past Medical History:  Diagnosis Date   Arthritis    "minor; shoulders primarily" (05/20/2013)   CAD (coronary artery disease)    a. 05/2013 - Chest pain/unstable angina and dynamic EKG changes showing cath showing atherosclerotic coronary artery disease manifested as diffuse ectasia, normal EF.   Diverticulosis of colon    GERD (gastroesophageal reflux disease)    GI bleed    secondary to Aspirin   Gout    Hematuria, microscopic    Hemorrhoids    Hyperglycemia    Hyperlipidemia    Hypertension    Lumbar back pain    Microalbuminuria    Overweight(278.02)    Polycythemia rubra vera (HCC)    Tubular adenoma of colon 07/2012    Social History   Socioeconomic History   Marital status: Married    Spouse name: Not on file   Number of children: 3    Years of education: Not on file   Highest education level: Some college, no degree  Occupational History   Occupation: retired Garment/textile technologist: RETIRED  Tobacco Use   Smoking status: Never   Smokeless tobacco: Never   Tobacco comments:    NEVER USED TOBACCO  Vaping Use   Vaping status: Never Used  Substance and Sexual Activity   Alcohol use: No    Alcohol/week: 0.0 standard drinks of alcohol   Drug use: No   Sexual activity: Yes    Partners: Female  Other Topics Concern   Not on file  Social History Narrative   Marriedfor 52 years    Daughter, Physicist, medical (pediatrician--lives in [place], [place]), one in [place] - Warden/ranger. One in [place] - Chartered loss adjuster          Social Determinants of Health   Financial Resource Strain: Low Risk  (06/11/2022)   Overall Financial Resource Strain (CARDIA)    Difficulty of Paying Living Expenses: Not hard at all  Food Insecurity: No Food Insecurity (06/11/2022)   Hunger Vital Sign    Worried About Running Out of Food in the Last Year: Never true    Ran Out of Food in the Last Year: Never true  Transportation Needs: No Transportation Needs (06/11/2022)   PRAPARE - Administrator, Civil Service (Medical): No  Lack of Transportation (Non-Medical): No  Physical Activity: Sufficiently Active (06/11/2022)   Exercise Vital Sign    Days of Exercise per Week: 7 days    Minutes of Exercise per Session: 90 min  Stress: No Stress Concern Present (06/11/2022)   [employer] of Occupational Health - Occupational Stress Questionnaire    Feeling of Stress : Not at all  Social Connections: Socially Integrated (06/11/2022)   Social Connection and Isolation Panel [NHANES]    Frequency of Communication with Friends and Family: More than three times a week    Frequency of Social Gatherings with Friends and Family: Twice a week    Attends Religious Services: More than 4 times per year    Active Member of [employer] or Organizations: Yes    Attends Museum/gallery exhibitions officer: More than 4 times per year    Marital Status: Married  Catering manager Violence: Not At Risk (09/09/2021)   Humiliation, Afraid, Rape, and Kick questionnaire    Fear of Current or Ex-Partner: No    Emotionally Abused: No    Physically Abused: No    Sexually Abused: No    Past Surgical History:  Procedure Laterality Date   CARDIAC CATHETERIZATION  05/20/2013   CATARACT EXTRACTION W/ INTRAOCULAR LENS  IMPLANT, BILATERAL Bilateral 2008   CYSTECTOMY  ~ 2000   " SEBACEOUS cyst removed from between shoulder blades"   EXCISIONAL HEMORRHOIDECTOMY  1970's   INGUINAL HERNIA REPAIR Right ~ 2010   LEFT HEART CATHETERIZATION WITH CORONARY ANGIOGRAM N/A 05/20/2013   Procedure: LEFT HEART CATHETERIZATION WITH CORONARY ANGIOGRAM;  Surgeon: Peter M Swaziland, MD;  Location: [facility] CATH LAB;  Service: Cardiovascular;  Laterality: N/A;   TONSILLECTOMY AND ADENOIDECTOMY  1940's   VASECTOMY      Family History  Problem Relation Age of Onset   Colon cancer Mother        died at 19, colorectal   Diabetes Mother    Heart disease Father 9       MI   Diabetes Father    Stomach cancer Maternal Grandfather    Cancer Maternal Uncle     Allergies  Allergen Reactions   Metformin And Related Diarrhea   Metformin Hcl Diarrhea    Current Outpatient Medications on File Prior to Visit  Medication Sig Dispense Refill   Accu-Chek Softclix Lancets lancets Use to test blood glucose once daily 100 each 3   allopurinol (ZYLOPRIM) 100 MG tablet TAKE ONE TABLET BY MOUTH DAILY 365 tablet 0   amLODipine (NORVASC) 10 MG tablet TAKE ONE TABLET BY MOUTH DAILY 90 tablet 3   aspirin EC 81 MG tablet Take 1 tablet (81 mg total) by mouth daily. 90 tablet 3   Blood Glucose Calibration (EMBRACE PRO GLUCOSE CONTROL) LIQD Use to check blood glucose TID 1 each    Blood Glucose Monitoring Suppl (ACCU-CHEK AVIVA PLUS) w/Device KIT Use to test blood glucose once daily 1 kit 0   Coenzyme Q10 300 MG CAPS Take 1  capsule by mouth daily.      glipiZIDE 2.5 MG TABS Take 2.5 mg by mouth daily. 90 tablet 0   glucose blood (EMBRACE PRO GLUCOSE TEST) test strip Use as instructed 100 each 12   hydrocortisone (ANUSOL-HC) 25 MG suppository Place 25 mg rectally daily as needed for hemorrhoids or anal itching.     lisinopril (ZESTRIL) 10 MG tablet TAKE ONE TABLET BY MOUTH DAILY 90 tablet 3   pantoprazole (PROTONIX) 40 MG tablet Take 1 tablet (40  mg total) by mouth daily. 365 tablet 0   pravastatin (PRAVACHOL) 40 MG tablet TAKE ONE TABLET BY MOUTH DAILY 365 tablet 0   PREVIDENT 5000 ENAMEL PROTECT 1.1-5 % PSTE See admin instructions.     PROCTO-MED HC 2.5 % rectal cream APPLY 1 CREAM RECTALLY TWICE DAILY 90 g 3   No current facility-administered medications on file prior to visit.    BP 110/62   Pulse 70   Temp (!) 97.5 F (36.4 C) (Oral)   Ht 5\' 11"  (1.803 m)   Wt 148 lb (67.1 kg)   SpO2 99%   BMI 20.64 kg/m       Objective:   Physical Exam Vitals and nursing note reviewed.  Constitutional:      Appearance: Normal appearance.  Cardiovascular:     Rate and Rhythm: Normal rate and regular rhythm.     Pulses: Normal pulses.     Heart sounds: Normal heart sounds.  Pulmonary:     Effort: Pulmonary effort is normal.     Breath sounds: Normal breath sounds.  Musculoskeletal:        General: Normal range of motion.  Skin:    General: Skin is warm and dry.  Neurological:     General: No focal deficit present.     Mental Status: He is alert and oriented to person, place, and time.  Psychiatric:        Mood and Affect: Mood normal.        Behavior: Behavior normal.        Thought Content: Thought content normal.        Judgment: Judgment normal.       Assessment & Plan:  1. At risk for weight loss - His weight loss is due to calorie deficiency. He needs to increase his calories of healthy low carb foods and increase proteins. Continue to cut back on excessive exercise.   2. Type 2 diabetes  mellitus with other specified complication, without long-term current use of insulin (HCC) - Can continue to d/c glipizide - Monitor BS - Has follow up next month for repeat A1c  Shirline Frees, NP

## 2022-08-09 ENCOUNTER — Encounter: Payer: Self-pay | Admitting: Adult Health

## 2022-08-09 MED ORDER — PROCTO-MED HC 2.5 % EX CREA: TOPICAL_CREAM | CUTANEOUS | 3 refills | Status: DC

## 2022-08-14 ENCOUNTER — Encounter: Payer: Self-pay | Admitting: Adult Health

## 2022-08-15 NOTE — Telephone Encounter (Signed)
Please advise 

## 2022-08-17 ENCOUNTER — Ambulatory Visit: Payer: Medicare HMO | Admitting: Adult Health

## 2022-08-17 ENCOUNTER — Encounter (INDEPENDENT_AMBULATORY_CARE_PROVIDER_SITE_OTHER): Payer: Self-pay

## 2022-08-17 ENCOUNTER — Encounter: Payer: Self-pay | Admitting: Adult Health

## 2022-08-17 ENCOUNTER — Encounter: Payer: Self-pay | Admitting: Cardiology

## 2022-08-17 VITALS — BP 150/90 | HR 50 | Temp 97.9°F | Ht 71.0 in | Wt 147.0 lb

## 2022-08-17 DIAGNOSIS — M545 Low back pain, unspecified: Secondary | ICD-10-CM | POA: Diagnosis not present

## 2022-08-17 DIAGNOSIS — I10 Essential (primary) hypertension: Secondary | ICD-10-CM

## 2022-08-17 MED ORDER — METHYLPREDNISOLONE 4 MG PO TBPK
ORAL_TABLET | ORAL | 0 refills | Status: DC
Start: 2022-08-17 — End: 2022-08-30

## 2022-08-17 NOTE — Progress Notes (Signed)
Subjective:    Patient ID: Alexander [name], male    DOB: 08/22/1940, 82 y.o.   MRN: [id number]  HPI  82 year old male who  has a past medical history of Arthritis, CAD (coronary artery disease), Diverticulosis of colon, GERD (gastroesophageal reflux disease), GI bleed, Gout, Hematuria, microscopic, Hemorrhoids, Hyperglycemia, Hyperlipidemia, Hypertension, Lumbar back pain, Microalbuminuria, Overweight(278.02), Polycythemia rubra vera (HCC), and Tubular adenoma of colon (07/2012).  He presents to the office today for an acute issue of back pain. He reports that he originally injured his back about 2 weeks ago while in the pool.  With conservative measures his symptoms improved until yesterday when he was picking up get a pool chemicals in his low back seized.  He has been in pretty significant discomfort since then.  Pain is located on the left lower side.  Pain does not radiate.  Pain is worse with twisting turning and bending.  He has had to have muscle relaxers in the past but feels as though these do not help make him feel fatigued.  Home he has been using topical ointment which seems to alleviate some of the pain.   Review of Systems See HPI   Past Medical History:  Diagnosis Date   Arthritis    "minor; shoulders primarily" (05/20/2013)   CAD (coronary artery disease)    a. 05/2013 - Chest pain/unstable angina and dynamic EKG changes showing cath showing atherosclerotic coronary artery disease manifested as diffuse ectasia, normal EF.   Diverticulosis of colon    GERD (gastroesophageal reflux disease)    GI bleed    secondary to Aspirin   Gout    Hematuria, microscopic    Hemorrhoids    Hyperglycemia    Hyperlipidemia    Hypertension    Lumbar back pain    Microalbuminuria    Overweight(278.02)    Polycythemia rubra vera (HCC)    Tubular adenoma of colon 07/2012    Social History   Socioeconomic History   Marital status: Married    Spouse name: Not on file   Number of  children: 3   Years of education: Not on file   Highest education level: Some college, no degree  Occupational History   Occupation: retired Garment/textile technologist: RETIRED  Tobacco Use   Smoking status: Never   Smokeless tobacco: Never   Tobacco comments:    NEVER USED TOBACCO  Vaping Use   Vaping status: Never Used  Substance and Sexual Activity   Alcohol use: No    Alcohol/week: 0.0 standard drinks of alcohol   Drug use: No   Sexual activity: Yes    Partners: Female  Other Topics Concern   Not on file  Social History Narrative   Marriedfor 52 years    Daughter, Physicist, medical (pediatrician--lives in [place], [place]), one in [place] - Warden/ranger. One in [place] - Chartered loss adjuster          Social Determinants of Health   Financial Resource Strain: Low Risk  (06/11/2022)   Overall Financial Resource Strain (CARDIA)    Difficulty of Paying Living Expenses: Not hard at all  Food Insecurity: No Food Insecurity (06/11/2022)   Hunger Vital Sign    Worried About Running Out of Food in the Last Year: Never true    Ran Out of Food in the Last Year: Never true  Transportation Needs: No Transportation Needs (06/11/2022)   PRAPARE - Administrator, Civil Service (Medical): No    Lack of Transportation (  Non-Medical): No  Physical Activity: Sufficiently Active (06/11/2022)   Exercise Vital Sign    Days of Exercise per Week: 7 days    Minutes of Exercise per Session: 90 min  Stress: No Stress Concern Present (06/11/2022)   [employer] of Occupational Health - Occupational Stress Questionnaire    Feeling of Stress : Not at all  Social Connections: Socially Integrated (06/11/2022)   Social Connection and Isolation Panel [NHANES]    Frequency of Communication with Friends and Family: More than three times a week    Frequency of Social Gatherings with Friends and Family: Twice a week    Attends Religious Services: More than 4 times per year    Active Member of [employer] or Organizations: Yes    Attends  Engineer, structural: More than 4 times per year    Marital Status: Married  Catering manager Violence: Not At Risk (09/09/2021)   Humiliation, Afraid, Rape, and Kick questionnaire    Fear of Current or Ex-Partner: No    Emotionally Abused: No    Physically Abused: No    Sexually Abused: No    Past Surgical History:  Procedure Laterality Date   CARDIAC CATHETERIZATION  05/20/2013   CATARACT EXTRACTION W/ INTRAOCULAR LENS  IMPLANT, BILATERAL Bilateral 2008   CYSTECTOMY  ~ 2000   " SEBACEOUS cyst removed from between shoulder blades"   EXCISIONAL HEMORRHOIDECTOMY  1970's   INGUINAL HERNIA REPAIR Right ~ 2010   LEFT HEART CATHETERIZATION WITH CORONARY ANGIOGRAM N/A 05/20/2013   Procedure: LEFT HEART CATHETERIZATION WITH CORONARY ANGIOGRAM;  Surgeon: Peter M Swaziland, MD;  Location: [facility] CATH LAB;  Service: Cardiovascular;  Laterality: N/A;   TONSILLECTOMY AND ADENOIDECTOMY  1940's   VASECTOMY      Family History  Problem Relation Age of Onset   Colon cancer Mother        died at 4, colorectal   Diabetes Mother    Heart disease Father 59       MI   Diabetes Father    Stomach cancer Maternal Grandfather    Cancer Maternal Uncle     Allergies  Allergen Reactions   Metformin And Related Diarrhea   Metformin Hcl Diarrhea    Current Outpatient Medications on File Prior to Visit  Medication Sig Dispense Refill   Accu-Chek Softclix Lancets lancets Use to test blood glucose once daily 100 each 3   allopurinol (ZYLOPRIM) 100 MG tablet TAKE ONE TABLET BY MOUTH DAILY 365 tablet 0   amLODipine (NORVASC) 10 MG tablet TAKE ONE TABLET BY MOUTH DAILY 90 tablet 3   aspirin EC 81 MG tablet Take 1 tablet (81 mg total) by mouth daily. 90 tablet 3   Blood Glucose Calibration (EMBRACE PRO GLUCOSE CONTROL) LIQD Use to check blood glucose TID 1 each    Blood Glucose Monitoring Suppl (ACCU-CHEK AVIVA PLUS) w/Device KIT Use to test blood glucose once daily 1 kit 0   Coenzyme Q10 300 MG CAPS  Take 1 capsule by mouth daily.      glipiZIDE 2.5 MG TABS Take 2.5 mg by mouth daily. 90 tablet 0   glucose blood (EMBRACE PRO GLUCOSE TEST) test strip Use as instructed 100 each 12   hydrocortisone (ANUSOL-HC) 25 MG suppository Place 25 mg rectally daily as needed for hemorrhoids or anal itching.     lisinopril (ZESTRIL) 10 MG tablet TAKE ONE TABLET BY MOUTH DAILY 90 tablet 3   pantoprazole (PROTONIX) 40 MG tablet Take 1 tablet (40 mg total) by  mouth daily. 365 tablet 0   pravastatin (PRAVACHOL) 40 MG tablet TAKE ONE TABLET BY MOUTH DAILY 365 tablet 0   PREVIDENT 5000 ENAMEL PROTECT 1.1-5 % PSTE See admin instructions.     PROCTO-MED HC 2.5 % rectal cream APPLY 1 CREAM RECTALLY TWICE DAILY 90 g 3   No current facility-administered medications on file prior to visit.    BP (!) 150/90   Pulse (!) 50   Temp 97.9 F (36.6 C) (Oral)   Ht 5\' 11"  (1.803 m)   Wt 147 lb (66.7 kg)   SpO2 100%   BMI 20.50 kg/m       Objective:   Physical Exam Vitals and nursing note reviewed.  Constitutional:      Appearance: Normal appearance.  Musculoskeletal:     Lumbar back: Spasms present.       Back:  Skin:    General: Skin is dry.  Neurological:     General: No focal deficit present.     Mental Status: He is oriented to person, place, and time.  Psychiatric:        Mood and Affect: Mood normal.        Behavior: Behavior normal.        Thought Content: Thought content normal.        Judgment: Judgment normal.        Assessment & Plan:  1. Acute bilateral low back pain without sciatica -Has responded well to a Medrol Dosepak for acute back pain.  Will send this in today, advised to continue with topical creams, heat, and rest.  Follow-up if not improving over the next 2 to 3 days - methylPREDNISolone (MEDROL DOSEPAK) 4 MG TBPK tablet; Take as directed  Dispense: 21 tablet; Refill: 0   2. Essential hypertension -He took his prescribed Norvasc just prior to being seen.  Continue to  monitor at home but blood pressure is well-controlled on this dose.   Shirline Frees, NP

## 2022-08-18 ENCOUNTER — Encounter: Payer: Self-pay | Admitting: Adult Health

## 2022-08-18 NOTE — Telephone Encounter (Signed)
Please see the MyChart message reply(ies) for my assessment and plan.   Sorry that you are bruising easily.  I looked at your recent labs, CBC, and it is normal I would advise to continue with the ASA 81mg  but if you would like, try taking it every other day.  It's important to be on ASA with coronary artery disease.  This patient gave consent for this Medical Advice Message and is aware that it may result in a bill to [employer], as well as the possibility of receiving a bill for a co-payment or deductible. They are an established patient, but are not seeking medical advice exclusively about a problem treated during an in person or video visit in the last seven days. I did not recommend an in person or video visit within seven days of my reply.    I spent a total of 5 minutes cumulative time within 7 days through [employer].  Donato Schultz, MD

## 2022-08-22 ENCOUNTER — Ambulatory Visit (INDEPENDENT_AMBULATORY_CARE_PROVIDER_SITE_OTHER): Payer: Medicare HMO | Admitting: Adult Health

## 2022-08-22 ENCOUNTER — Encounter: Payer: Self-pay | Admitting: Adult Health

## 2022-08-22 ENCOUNTER — Ambulatory Visit: Payer: Medicare HMO | Admitting: Adult Health

## 2022-08-22 ENCOUNTER — Ambulatory Visit (INDEPENDENT_AMBULATORY_CARE_PROVIDER_SITE_OTHER): Payer: Medicare HMO

## 2022-08-22 VITALS — BP 140/70 | HR 45 | Temp 97.9°F | Ht 71.0 in | Wt 143.0 lb

## 2022-08-22 DIAGNOSIS — R634 Abnormal weight loss: Secondary | ICD-10-CM

## 2022-08-22 NOTE — Progress Notes (Signed)
Subjective:    Patient ID: Alexander [name], male    DOB: 03-29-1940, 82 y.o.   MRN: [id number]  HPI  82 year old male who  has a past medical history of Arthritis, CAD (coronary artery disease), Diverticulosis of colon, GERD (gastroesophageal reflux disease), GI bleed, Gout, Hematuria, microscopic, Hemorrhoids, Hyperglycemia, Hyperlipidemia, Hypertension, Lumbar back pain, Microalbuminuria, Overweight(278.02), Polycythemia rubra vera (HCC), and Tubular adenoma of colon (07/2012).  He presents to the office today for continued unintentional weight loss. He is eating a lot throughout the da and always feels hungry but not able to gain weight, he has cut back from swimming from 2 hours a day to swimming to 30 minutes every other day. At home his weight yesterday was 138 lbs.   He denies fevers, chills, diarrhea, abdominal pain or feeling acutely ill.   Wt Readings from Last 10 Encounters:  08/22/22 143 lb (64.9 kg)  08/17/22 147 lb (66.7 kg)  08/03/22 148 lb (67.1 kg)  06/16/22 155 lb (70.3 kg)  06/02/22 159 lb 12.8 oz (72.5 kg)  05/30/22 157 lb (71.2 kg)  03/21/22 162 lb (73.5 kg)  02/21/22 165 lb 1.3 oz (74.9 kg)  01/27/22 169 lb (76.7 kg)  12/14/21 171 lb (77.6 kg)  ]  Review of Systems See HPI   Past Medical History:  Diagnosis Date   Arthritis    "minor; shoulders primarily" (05/20/2013)   CAD (coronary artery disease)    a. 05/2013 - Chest pain/unstable angina and dynamic EKG changes showing cath showing atherosclerotic coronary artery disease manifested as diffuse ectasia, normal EF.   Diverticulosis of colon    GERD (gastroesophageal reflux disease)    GI bleed    secondary to Aspirin   Gout    Hematuria, microscopic    Hemorrhoids    Hyperglycemia    Hyperlipidemia    Hypertension    Lumbar back pain    Microalbuminuria    Overweight(278.02)    Polycythemia rubra vera (HCC)    Tubular adenoma of colon 07/2012    Social History   Socioeconomic History    Marital status: Married    Spouse name: Not on file   Number of children: 3   Years of education: Not on file   Highest education level: Some college, no degree  Occupational History   Occupation: retired Garment/textile technologist: RETIRED  Tobacco Use   Smoking status: Never   Smokeless tobacco: Never   Tobacco comments:    NEVER USED TOBACCO  Vaping Use   Vaping status: Never Used  Substance and Sexual Activity   Alcohol use: No    Alcohol/week: 0.0 standard drinks of alcohol   Drug use: No   Sexual activity: Yes    Partners: Female  Other Topics Concern   Not on file  Social History Narrative   Marriedfor 52 years    Daughter, Physicist, medical (pediatrician--lives in [place], [place]), one in [place] - Warden/ranger. One in [place] - Chartered loss adjuster          Social Determinants of Health   Financial Resource Strain: Low Risk  (06/11/2022)   Overall Financial Resource Strain (CARDIA)    Difficulty of Paying Living Expenses: Not hard at all  Food Insecurity: No Food Insecurity (06/11/2022)   Hunger Vital Sign    Worried About Running Out of Food in the Last Year: Never true    Ran Out of Food in the Last Year: Never true  Transportation Needs: No Transportation Needs (06/11/2022)  PRAPARE - Administrator, Civil Service (Medical): No    Lack of Transportation (Non-Medical): No  Physical Activity: Sufficiently Active (06/11/2022)   Exercise Vital Sign    Days of Exercise per Week: 7 days    Minutes of Exercise per Session: 90 min  Stress: No Stress Concern Present (06/11/2022)   [employer] of Occupational Health - Occupational Stress Questionnaire    Feeling of Stress : Not at all  Social Connections: Socially Integrated (06/11/2022)   Social Connection and Isolation Panel [NHANES]    Frequency of Communication with Friends and Family: More than three times a week    Frequency of Social Gatherings with Friends and Family: Twice a week    Attends Religious Services: More than 4 times per  year    Active Member of [employer] or Organizations: Yes    Attends Engineer, structural: More than 4 times per year    Marital Status: Married  Catering manager Violence: Not At Risk (09/09/2021)   Humiliation, Afraid, Rape, and Kick questionnaire    Fear of Current or Ex-Partner: No    Emotionally Abused: No    Physically Abused: No    Sexually Abused: No    Past Surgical History:  Procedure Laterality Date   CARDIAC CATHETERIZATION  05/20/2013   CATARACT EXTRACTION W/ INTRAOCULAR LENS  IMPLANT, BILATERAL Bilateral 2008   CYSTECTOMY  ~ 2000   " SEBACEOUS cyst removed from between shoulder blades"   EXCISIONAL HEMORRHOIDECTOMY  1970's   INGUINAL HERNIA REPAIR Right ~ 2010   LEFT HEART CATHETERIZATION WITH CORONARY ANGIOGRAM N/A 05/20/2013   Procedure: LEFT HEART CATHETERIZATION WITH CORONARY ANGIOGRAM;  Surgeon: Peter M Swaziland, MD;  Location: [facility] CATH LAB;  Service: Cardiovascular;  Laterality: N/A;   TONSILLECTOMY AND ADENOIDECTOMY  1940's   VASECTOMY      Family History  Problem Relation Age of Onset   Colon cancer Mother        died at 76, colorectal   Diabetes Mother    Heart disease Father 67       MI   Diabetes Father    Stomach cancer Maternal Grandfather    Cancer Maternal Uncle     Allergies  Allergen Reactions   Metformin And Related Diarrhea   Metformin Hcl Diarrhea    Current Outpatient Medications on File Prior to Visit  Medication Sig Dispense Refill   Accu-Chek Softclix Lancets lancets Use to test blood glucose once daily 100 each 3   allopurinol (ZYLOPRIM) 100 MG tablet TAKE ONE TABLET BY MOUTH DAILY 365 tablet 0   amLODipine (NORVASC) 10 MG tablet TAKE ONE TABLET BY MOUTH DAILY 90 tablet 3   aspirin EC 81 MG tablet Take 1 tablet (81 mg total) by mouth daily. 90 tablet 3   Blood Glucose Calibration (EMBRACE PRO GLUCOSE CONTROL) LIQD Use to check blood glucose TID 1 each    Blood Glucose Monitoring Suppl (ACCU-CHEK AVIVA PLUS) w/Device KIT Use to  test blood glucose once daily 1 kit 0   Coenzyme Q10 300 MG CAPS Take 1 capsule by mouth daily.      glipiZIDE 2.5 MG TABS Take 2.5 mg by mouth daily. 90 tablet 0   glucose blood (EMBRACE PRO GLUCOSE TEST) test strip Use as instructed 100 each 12   hydrocortisone (ANUSOL-HC) 25 MG suppository Place 25 mg rectally daily as needed for hemorrhoids or anal itching.     lisinopril (ZESTRIL) 10 MG tablet TAKE ONE TABLET BY MOUTH DAILY  90 tablet 3   methylPREDNISolone (MEDROL DOSEPAK) 4 MG TBPK tablet Take as directed 21 tablet 0   pantoprazole (PROTONIX) 40 MG tablet Take 1 tablet (40 mg total) by mouth daily. 365 tablet 0   pravastatin (PRAVACHOL) 40 MG tablet TAKE ONE TABLET BY MOUTH DAILY 365 tablet 0   PREVIDENT 5000 ENAMEL PROTECT 1.1-5 % PSTE See admin instructions.     PROCTO-MED HC 2.5 % rectal cream APPLY 1 CREAM RECTALLY TWICE DAILY 90 g 3   No current facility-administered medications on file prior to visit.    BP (!) 140/70   Pulse (!) 45   Temp 97.9 F (36.6 C) (Oral)   Ht 5\' 11"  (1.803 m)   Wt 143 lb (64.9 kg)   SpO2 99%   BMI 19.94 kg/m       Objective:   Physical Exam Vitals reviewed.  Constitutional:      Appearance: Normal appearance.  Cardiovascular:     Pulses: Normal pulses.     Heart sounds: Normal heart sounds.  Pulmonary:     Effort: Pulmonary effort is normal.     Breath sounds: Normal breath sounds.  Musculoskeletal:        General: Normal range of motion.  Skin:    General: Skin is warm and dry.  Neurological:     General: No focal deficit present.     Mental Status: He is alert and oriented to person, place, and time.  Psychiatric:        Mood and Affect: Mood normal.        Behavior: Behavior normal.        Thought Content: Thought content normal.        Judgment: Judgment normal.       Assessment & Plan:  1. Unintentional weight loss - Was thought that weight loss was due to excessive exercise and high fiber diet in the past and it may  still be. Will start with labs and chest xray  - Likely going to need CT abdomen and pelvis  - CBC; Future - TSH; Future - Sedimentation Rate - Urinalysis; Future - DG Chest 2 View; Future  Shirline Frees, NP  Time spent with patient today was 31 minutes which consisted of chart review, unintentional weight loss, , work up, treatment answering questions and documentation.

## 2022-08-23 LAB — SEDIMENTATION RATE: Sed Rate: 7 mm/h (ref 0–20)

## 2022-08-23 LAB — URINALYSIS
Bilirubin Urine: NEGATIVE
Hgb urine dipstick: NEGATIVE
Ketones, ur: NEGATIVE
Leukocytes,Ua: NEGATIVE
Nitrite: NEGATIVE
Specific Gravity, Urine: 1.025 (ref 1.000–1.030)
Total Protein, Urine: NEGATIVE
Urine Glucose: >=1000 — AB
Urobilinogen, UA: 0.2 (ref 0.0–1.0)
pH: 6 (ref 5.0–8.0)

## 2022-08-23 LAB — CBC
HCT: 43 % (ref 39.0–52.0)
Hemoglobin: 13.8 g/dL (ref 13.0–17.0)
MCHC: 32.1 g/dL (ref 30.0–36.0)
MCV: 100.1 fl — ABNORMAL HIGH (ref 78.0–100.0)
Platelets: 185 10*3/uL (ref 150.0–400.0)
RBC: 4.29 Mil/uL (ref 4.22–5.81)
RDW: 15.5 % (ref 11.5–15.5)
WBC: 11.1 10*3/uL — ABNORMAL HIGH (ref 4.0–10.5)

## 2022-08-24 ENCOUNTER — Telehealth: Payer: Self-pay | Admitting: Adult Health

## 2022-08-24 ENCOUNTER — Encounter: Payer: Self-pay | Admitting: Adult Health

## 2022-08-24 ENCOUNTER — Telehealth: Payer: Self-pay

## 2022-08-24 NOTE — Telephone Encounter (Signed)
Per Kandee Keen,  " Labs normal  Except slightly elevated WBC. This is from steroid,"    Contacted pt and went over lab result. Verbalized understanding.   Pt states he has appt with [facility] tomorrow for elevated blood sugar.    No further action is needed

## 2022-08-24 NOTE — Telephone Encounter (Signed)
Margo with triage nurse called and stated pt's blood sugar was over 400. He states he was recently taken off his diabetes medicine. RN Coy Saunas states pt needs to see PCP, I let her know our next available is not until tomorrow, she states that is fine. I scheduled appt for pt tomorrow with Kandee Keen at 1:30pm and let pt know I would send high priority msg to care team to let them know what is going on.

## 2022-08-25 ENCOUNTER — Ambulatory Visit (INDEPENDENT_AMBULATORY_CARE_PROVIDER_SITE_OTHER): Payer: Medicare HMO | Admitting: Adult Health

## 2022-08-25 ENCOUNTER — Encounter: Payer: Self-pay | Admitting: Adult Health

## 2022-08-25 VITALS — BP 120/70 | HR 53 | Temp 98.2°F | Ht 71.0 in | Wt 141.0 lb

## 2022-08-25 DIAGNOSIS — Z7984 Long term (current) use of oral hypoglycemic drugs: Secondary | ICD-10-CM | POA: Diagnosis not present

## 2022-08-25 DIAGNOSIS — R634 Abnormal weight loss: Secondary | ICD-10-CM | POA: Diagnosis not present

## 2022-08-25 DIAGNOSIS — E1169 Type 2 diabetes mellitus with other specified complication: Secondary | ICD-10-CM | POA: Diagnosis not present

## 2022-08-25 LAB — GLUCOSE, POCT (MANUAL RESULT ENTRY): POC Glucose: 249 mg/dl — AB (ref 70–99)

## 2022-08-25 LAB — TSH: TSH: 0.7 u[IU]/mL (ref 0.35–5.50)

## 2022-08-25 MED ORDER — GLIPIZIDE ER 2.5 MG PO TB24
2.5000 mg | ORAL_TABLET | Freq: Every day | ORAL | 3 refills | Status: DC
Start: 2022-08-25 — End: 2022-09-05

## 2022-08-25 NOTE — Progress Notes (Signed)
Subjective:    Patient ID: Alexander [name], male    DOB: 01/25/1940, 82 y.o.   MRN: [id number]  HPI  82 year old male who  has a past medical history of Arthritis, CAD (coronary artery disease), Diverticulosis of colon, GERD (gastroesophageal reflux disease), GI bleed, Gout, Hematuria, microscopic, Hemorrhoids, Hyperglycemia, Hyperlipidemia, Hypertension, Lumbar back pain, Microalbuminuria, Overweight(278.02), Polycythemia rubra vera (HCC), and Tubular adenoma of colon (07/2012).  He presents to the office today for elevated blood sugar readings.  He does have a history of diabetes mellitus, was recently on glipizide 2.5 mg but this was recently discontinued due to low blood sugar readings secondary to weight loss.  Over the last month he has had weight loss has been unintentional, at first it was thought due to excessive exercise where he was swimming laps in his home pool for up to 2 hours a day.  He significantly cut back on his exercise and increased his calorie intake exponentially  Per his home scale his weight has stabalized but he continues to feel hungry " all the time".   Yesterday he had a blood sugar reading of 400.   Last week we checked some basic labs due to unintentional weight loss CBC, TSH, urinalysis, and sed rate were within normal limits.  His chest x-ray has not resulted yet.  He continues to feel well overall, has not had any abdominal pain, fevers, chills, nausea, vomiting, blood in stool, or shortness of breath.  Wt Readings from Last 10 Encounters:  08/25/22 141 lb (64 kg)  08/22/22 143 lb (64.9 kg)  08/17/22 147 lb (66.7 kg)  08/03/22 148 lb (67.1 kg)  06/16/22 155 lb (70.3 kg)  06/02/22 159 lb 12.8 oz (72.5 kg)  05/30/22 157 lb (71.2 kg)  03/21/22 162 lb (73.5 kg)  02/21/22 165 lb 1.3 oz (74.9 kg)  01/27/22 169 lb (76.7 kg)   Review of Systems See HPI   Past Medical History:  Diagnosis Date   Arthritis    "minor; shoulders primarily" (05/20/2013)    CAD (coronary artery disease)    a. 05/2013 - Chest pain/unstable angina and dynamic EKG changes showing cath showing atherosclerotic coronary artery disease manifested as diffuse ectasia, normal EF.   Diverticulosis of colon    GERD (gastroesophageal reflux disease)    GI bleed    secondary to Aspirin   Gout    Hematuria, microscopic    Hemorrhoids    Hyperglycemia    Hyperlipidemia    Hypertension    Lumbar back pain    Microalbuminuria    Overweight(278.02)    Polycythemia rubra vera (HCC)    Tubular adenoma of colon 07/2012    Social History   Socioeconomic History   Marital status: Married    Spouse name: Not on file   Number of children: 3   Years of education: Not on file   Highest education level: Some college, no degree  Occupational History   Occupation: retired Garment/textile technologist: RETIRED  Tobacco Use   Smoking status: Never   Smokeless tobacco: Never   Tobacco comments:    NEVER USED TOBACCO  Vaping Use   Vaping status: Never Used  Substance and Sexual Activity   Alcohol use: No    Alcohol/week: 0.0 standard drinks of alcohol   Drug use: No   Sexual activity: Yes    Partners: Female  Other Topics Concern   Not on file  Social History Narrative   Marriedfor 52 years  Daughter, Elease Hashimoto (pediatrician--lives in [place], [place]), one in [place] - Warden/ranger. One in [place] - Chartered loss adjuster          Social Determinants of Health   Financial Resource Strain: Low Risk  (06/11/2022)   Overall Financial Resource Strain (CARDIA)    Difficulty of Paying Living Expenses: Not hard at all  Food Insecurity: No Food Insecurity (06/11/2022)   Hunger Vital Sign    Worried About Running Out of Food in the Last Year: Never true    Ran Out of Food in the Last Year: Never true  Transportation Needs: No Transportation Needs (06/11/2022)   PRAPARE - Administrator, Civil Service (Medical): No    Lack of Transportation (Non-Medical): No  Physical Activity: Sufficiently  Active (06/11/2022)   Exercise Vital Sign    Days of Exercise per Week: 7 days    Minutes of Exercise per Session: 90 min  Stress: No Stress Concern Present (06/11/2022)   [employer] of Occupational Health - Occupational Stress Questionnaire    Feeling of Stress : Not at all  Social Connections: Socially Integrated (06/11/2022)   Social Connection and Isolation Panel [NHANES]    Frequency of Communication with Friends and Family: More than three times a week    Frequency of Social Gatherings with Friends and Family: Twice a week    Attends Religious Services: More than 4 times per year    Active Member of [employer] or Organizations: Yes    Attends Engineer, structural: More than 4 times per year    Marital Status: Married  Catering manager Violence: Not At Risk (09/09/2021)   Humiliation, Afraid, Rape, and Kick questionnaire    Fear of Current or Ex-Partner: No    Emotionally Abused: No    Physically Abused: No    Sexually Abused: No    Past Surgical History:  Procedure Laterality Date   CARDIAC CATHETERIZATION  05/20/2013   CATARACT EXTRACTION W/ INTRAOCULAR LENS  IMPLANT, BILATERAL Bilateral 2008   CYSTECTOMY  ~ 2000   " SEBACEOUS cyst removed from between shoulder blades"   EXCISIONAL HEMORRHOIDECTOMY  1970's   INGUINAL HERNIA REPAIR Right ~ 2010   LEFT HEART CATHETERIZATION WITH CORONARY ANGIOGRAM N/A 05/20/2013   Procedure: LEFT HEART CATHETERIZATION WITH CORONARY ANGIOGRAM;  Surgeon: Peter M Swaziland, MD;  Location[facility] C[facility] LAB;  Service: Cardiovascular;  Laterality: N/A;   TONSILLECTOMY AND ADENOIDECTOMY  1940's   VASECTOMY      Family History  Problem Relation Age of Onset   Colon cancer Mother        died at 56, colorectal   Diabetes Mother    Heart disease Father 40       MI   Diabetes Father    Stomach cancer Maternal Grandfather    Cancer Maternal Uncle     Allergies  Allergen Reactions   Metformin And Related Diarrhea   Metformin Hcl Diarrhea     Current Outpatient Medications on File Prior to Visit  Medication Sig Dispense Refill   Accu-Chek Softclix Lancets lancets Use to test blood glucose once daily 100 each 3   allopurinol (ZYLOPRIM) 100 MG tablet TAKE ONE TABLET BY MOUTH DAILY 365 tablet 0   amLODipine (NORVASC) 10 MG tablet TAKE ONE TABLET BY MOUTH DAILY 90 tablet 3   aspirin EC 81 MG tablet Take 1 tablet (81 mg total) by mouth daily. 90 tablet 3   Blood Glucose Calibration (EMBRACE PRO GLUCOSE CONTROL) LIQD Use to check blood glucose  TID 1 each    Blood Glucose Monitoring Suppl (ACCU-CHEK AVIVA PLUS) w/Device KIT Use to test blood glucose once daily 1 kit 0   Coenzyme Q10 300 MG CAPS Take 1 capsule by mouth daily.      glipiZIDE 2.5 MG TABS Take 2.5 mg by mouth daily. 90 tablet 0   glucose blood (EMBRACE PRO GLUCOSE TEST) test strip Use as instructed 100 each 12   hydrocortisone (ANUSOL-HC) 25 MG suppository Place 25 mg rectally daily as needed for hemorrhoids or anal itching.     lisinopril (ZESTRIL) 10 MG tablet TAKE ONE TABLET BY MOUTH DAILY 90 tablet 3   methylPREDNISolone (MEDROL DOSEPAK) 4 MG TBPK tablet Take as directed 21 tablet 0   pantoprazole (PROTONIX) 40 MG tablet Take 1 tablet (40 mg total) by mouth daily. 365 tablet 0   pravastatin (PRAVACHOL) 40 MG tablet TAKE ONE TABLET BY MOUTH DAILY 365 tablet 0   PREVIDENT 5000 ENAMEL PROTECT 1.1-5 % PSTE See admin instructions.     PROCTO-MED HC 2.5 % rectal cream APPLY 1 CREAM RECTALLY TWICE DAILY 90 g 3   No current facility-administered medications on file prior to visit.    BP 120/70   Pulse (!) 53   Temp 98.2 F (36.8 C) (Oral)   Ht 5\' 11"  (1.803 m)   Wt 141 lb (64 kg)   SpO2 97%   BMI 19.67 kg/m       Objective:   Physical Exam Vitals and nursing note reviewed.  Constitutional:      Appearance: Normal appearance.  Cardiovascular:     Rate and Rhythm: Normal rate and regular rhythm.     Pulses: Normal pulses.     Heart sounds: Normal heart  sounds.  Pulmonary:     Breath sounds: Normal breath sounds.  Musculoskeletal:        General: Normal range of motion.  Skin:    General: Skin is warm and dry.  Neurological:     General: No focal deficit present.     Mental Status: He is alert and oriented to person, place, and time.  Psychiatric:        Mood and Affect: Mood normal.        Behavior: Behavior normal.        Thought Content: Thought content normal.        Judgment: Judgment normal.        Assessment & Plan:   1. Type 2 diabetes mellitus with other specified complication, without long-term current use of insulin (HCC) - POC Glucose 249 - Will add Glipizide 2.5 mg ER daily - Cut back on carbs  - glipiZIDE (GLUCOTROL XL) 2.5 MG 24 hr tablet; Take 1 tablet (2.5 mg total) by mouth daily with breakfast.  Dispense: 90 tablet; Refill: 3  2. Unintentional weight loss - Advised to check weight once a week - Send me results in 3 weeks  - Can consider CT abdomen and pelvis  - Can consider GI refferal

## 2022-08-25 NOTE — Telephone Encounter (Signed)
Pt was seen in office for this °

## 2022-08-25 NOTE — Telephone Encounter (Signed)
This has been taking care of. Pt is in office.

## 2022-08-29 ENCOUNTER — Telehealth: Payer: Self-pay | Admitting: Adult Health

## 2022-08-29 NOTE — Telephone Encounter (Signed)
Called pharmacy was on hold. Will call again shortly.

## 2022-08-29 NOTE — Telephone Encounter (Signed)
Wants to know if pcp prefers XR or IR for his glipiZIDE (GLUCOTROL XL) 2.5 MG 24 hr tablet

## 2022-08-29 NOTE — Telephone Encounter (Signed)
Called pharmacy multiple times no answer. Spoke to pt and he stated that everything was taken care of. No further actions needed.

## 2022-08-30 ENCOUNTER — Other Ambulatory Visit: Payer: Self-pay

## 2022-08-30 ENCOUNTER — Encounter: Payer: Self-pay | Admitting: Hematology & Oncology

## 2022-08-30 ENCOUNTER — Inpatient Hospital Stay: Payer: Medicare HMO

## 2022-08-30 ENCOUNTER — Inpatient Hospital Stay (HOSPITAL_BASED_OUTPATIENT_CLINIC_OR_DEPARTMENT_OTHER): Payer: Medicare HMO | Admitting: Hematology & Oncology

## 2022-08-30 ENCOUNTER — Telehealth: Payer: Self-pay | Admitting: *Deleted

## 2022-08-30 VITALS — BP 152/59 | HR 53 | Temp 98.2°F | Resp 18 | Ht 71.0 in | Wt 140.2 lb

## 2022-08-30 DIAGNOSIS — R634 Abnormal weight loss: Secondary | ICD-10-CM | POA: Insufficient documentation

## 2022-08-30 DIAGNOSIS — D751 Secondary polycythemia: Secondary | ICD-10-CM | POA: Insufficient documentation

## 2022-08-30 DIAGNOSIS — Z79899 Other long term (current) drug therapy: Secondary | ICD-10-CM | POA: Insufficient documentation

## 2022-08-30 DIAGNOSIS — R7989 Other specified abnormal findings of blood chemistry: Secondary | ICD-10-CM | POA: Insufficient documentation

## 2022-08-30 DIAGNOSIS — M549 Dorsalgia, unspecified: Secondary | ICD-10-CM | POA: Insufficient documentation

## 2022-08-30 DIAGNOSIS — D45 Polycythemia vera: Secondary | ICD-10-CM

## 2022-08-30 DIAGNOSIS — K831 Obstruction of bile duct: Secondary | ICD-10-CM | POA: Diagnosis not present

## 2022-08-30 DIAGNOSIS — Z7982 Long term (current) use of aspirin: Secondary | ICD-10-CM | POA: Insufficient documentation

## 2022-08-30 LAB — CBC WITH DIFFERENTIAL (CANCER CENTER ONLY)
Abs Immature Granulocytes: 0.09 10*3/uL — ABNORMAL HIGH (ref 0.00–0.07)
Basophils Absolute: 0.1 10*3/uL (ref 0.0–0.1)
Basophils Relative: 1 %
Eosinophils Absolute: 0.1 10*3/uL (ref 0.0–0.5)
Eosinophils Relative: 1 %
HCT: 39.1 % (ref 39.0–52.0)
Hemoglobin: 13.5 g/dL (ref 13.0–17.0)
Immature Granulocytes: 1 %
Lymphocytes Relative: 16 %
Lymphs Abs: 1.3 10*3/uL (ref 0.7–4.0)
MCH: 32.1 pg (ref 26.0–34.0)
MCHC: 34.5 g/dL (ref 30.0–36.0)
MCV: 93.1 fL (ref 80.0–100.0)
Monocytes Absolute: 1 10*3/uL (ref 0.1–1.0)
Monocytes Relative: 12 %
Neutro Abs: 5.5 10*3/uL (ref 1.7–7.7)
Neutrophils Relative %: 69 %
Platelet Count: 171 10*3/uL (ref 150–400)
RBC: 4.2 MIL/uL — ABNORMAL LOW (ref 4.22–5.81)
RDW: 15.8 % — ABNORMAL HIGH (ref 11.5–15.5)
WBC Count: 8 10*3/uL (ref 4.0–10.5)
nRBC: 0 % (ref 0.0–0.2)

## 2022-08-30 LAB — CMP (CANCER CENTER ONLY)
ALT: 347 U/L (ref 0–44)
AST: 176 U/L (ref 15–41)
Albumin: 4.1 g/dL (ref 3.5–5.0)
Alkaline Phosphatase: 440 U/L — ABNORMAL HIGH (ref 38–126)
Anion gap: 7 (ref 5–15)
BUN: 15 mg/dL (ref 8–23)
CO2: 27 mmol/L (ref 22–32)
Calcium: 9.5 mg/dL (ref 8.9–10.3)
Chloride: 101 mmol/L (ref 98–111)
Creatinine: 1.45 mg/dL — ABNORMAL HIGH (ref 0.61–1.24)
GFR, Estimated: 48 mL/min — ABNORMAL LOW (ref >60)
Glucose, Bld: 314 mg/dL — ABNORMAL HIGH (ref 70–99)
Potassium: 4.8 mmol/L (ref 3.5–5.1)
Sodium: 135 mmol/L (ref 135–145)
Total Bilirubin: 12.5 mg/dL (ref 0.3–1.2)
Total Protein: 6.2 g/dL — ABNORMAL LOW (ref 6.5–8.1)

## 2022-08-30 LAB — RETICULOCYTES
Immature Retic Fract: 2.6 % (ref 2.3–15.9)
RBC.: 4.15 MIL/uL — ABNORMAL LOW (ref 4.22–5.81)
Retic Count, Absolute: 28.2 10*3/uL (ref 19.0–186.0)
Retic Ct Pct: 0.7 % (ref 0.4–3.1)

## 2022-08-30 LAB — FERRITIN: Ferritin: 240 ng/mL (ref 24–336)

## 2022-08-30 MED ORDER — DOXYCYCLINE HYCLATE 100 MG PO TABS: 100.0000 mg | ORAL_TABLET | Freq: Two times a day (BID) | ORAL | 0 refills | Status: DC

## 2022-08-30 NOTE — Telephone Encounter (Signed)
T Bili 12.5, Alt 347, AST 176 per Gery Pray in lab.  Dr Myna Hidalgo notified.  In with patient in exam room

## 2022-08-30 NOTE — Progress Notes (Signed)
Hematology and Oncology Follow Up Visit  Alexander [name] [id number] [birth year] 82 y.o. 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  Alexander [name] is in for a followup.  It looks like we have a real problem.  He is lost quite a bit of weight since we last saw him.  He is incredibly jaundiced.  He said he went to see his family doctor recently.  He apparently had a chest x-ray which may have shown some possible infection.  He has been eating quite well.  He has had a little bit of back pain.  It seems to be over in the flank.  He has had no problems with bowels or bladder.  He says his urine is on the dark side.   His bilirubin today is 12.5.  His LFTs are quite elevated.  When we last saw him, he had normal LFTs.  He clearly needs to have a CT scan done.  He findings to be in the hospital but he does not want to go in because he takes care of his wife.  He has had no nausea or vomiting.  He has had a little bit of a cough.  I think he was on some steroids for this.  He has had no leg swelling.  He has had no skin pruritus.  There is been no bleeding.  Overall, I would say his performance status is probably ECOG 1.     Medications:  Current Outpatient Medications:    Accu-Chek Softclix Lancets lancets, Use to test blood glucose once daily, Disp: 100 each, Rfl: 3   allopurinol (ZYLOPRIM) 100 MG tablet, TAKE ONE TABLET BY MOUTH DAILY, Disp: 365 tablet, Rfl: 0   amLODipine (NORVASC) 10 MG tablet, TAKE ONE TABLET BY MOUTH DAILY, Disp: 90 tablet, Rfl: 3   aspirin EC 81 MG tablet, Take 1 tablet (81 mg total) by mouth daily., Disp: 90 tablet, Rfl: 3   Blood Glucose Calibration (EMBRACE PRO GLUCOSE CONTROL) LIQD, Use to check blood glucose TID, Disp: 1 each, Rfl:    Blood Glucose Monitoring Suppl (ACCU-CHEK AVIVA  PLUS) w/Device KIT, Use to test blood glucose once daily, Disp: 1 kit, Rfl: 0   Coenzyme Q10 300 MG CAPS, Take 1 capsule by mouth daily. , Disp: , Rfl:    glipiZIDE (GLUCOTROL XL) 2.5 MG 24 hr tablet, Take 1 tablet (2.5 mg total) by mouth daily with breakfast., Disp: 90 tablet, Rfl: 3   glucose blood (EMBRACE PRO GLUCOSE TEST) test strip, Use as instructed, Disp: 100 each, Rfl: 12   hydrocortisone (ANUSOL-HC) 25 MG suppository, Place 25 mg rectally daily as needed for hemorrhoids or anal itching., Disp: , Rfl:    lisinopril (ZESTRIL) 10 MG tablet, TAKE ONE TABLET BY MOUTH DAILY, Disp: 90 tablet, Rfl: 3   methylPREDNISolone (MEDROL DOSEPAK) 4 MG TBPK tablet, Take as directed, Disp: 21 tablet, Rfl: 0   pantoprazole (PROTONIX) 40 MG tablet, Take 1 tablet (40 mg total) by mouth daily., Disp: 365 tablet, Rfl: 0   pravastatin (PRAVACHOL) 40 MG tablet, TAKE ONE TABLET BY MOUTH DAILY, Disp: 365 tablet, Rfl: 0   PREVIDENT 5000 ENAMEL PROTECT 1.1-5 % PSTE, See admin instructions., Disp: , Rfl:    PROCTO-MED HC 2.5 % rectal cream, APPLY 1 CREAM RECTALLY TWICE DAILY, Disp: 90 g, Rfl: 3  Allergies:  Allergies  Allergen Reactions   Metformin And Related Diarrhea   Metformin Hcl Diarrhea    Past Medical History, Surgical history, Social history, and Family History were reviewed and updated.  Review of Systems: Review of Systems  Constitutional: Negative.   HENT: Negative.    Eyes: Negative.   Respiratory: Negative.    Cardiovascular: Negative.   Gastrointestinal: Negative.   Genitourinary: Negative.   Musculoskeletal: Negative.   Skin: Negative.   Neurological: Negative.   Endo/Heme/Allergies: Negative.   Psychiatric/Behavioral: Negative.      Physical Exam: Vital signs show temperature of 97.5.  Pulse 55.  Blood pressure 127/78.  Weight is 157 pounds.  Physical Exam Vitals reviewed.  HENT:     Head: Normocephalic and atraumatic.  Eyes:     Pupils: Pupils are equal, round, and reactive  to light.  Cardiovascular:     Rate and Rhythm: Normal rate and regular rhythm.     Heart sounds: Normal heart sounds.  Pulmonary:     Effort: Pulmonary effort is normal.     Breath sounds: Normal breath sounds.  Abdominal:     General: Bowel sounds are normal.     Palpations: Abdomen is soft.  Musculoskeletal:        General: No tenderness or deformity. Normal range of motion.     Cervical back: Normal range of motion.  Lymphadenopathy:     Cervical: No cervical adenopathy.  Skin:    General: Skin is warm and dry.     Findings: No erythema or rash.  Neurological:     Mental Status: He is alert and oriented to person, place, and time.  Psychiatric:        Behavior: Behavior normal.        Thought Content: Thought content normal.        Judgment: Judgment normal.     Lab Results  Component Value Date   WBC 8.0 08/30/2022   HGB 13.5 08/30/2022   HCT 39.1 08/30/2022   MCV 93.1 08/30/2022   PLT 171 08/30/2022     Chemistry      Component Value Date/Time   NA 139 05/24/2022 1213   NA 144 11/29/2016 1054   NA 139 12/29/2015 0954   K 4.6 05/24/2022 1213   K 4.2 11/29/2016 1054   K 4.1 12/29/2015 0954   CL 102 05/24/2022 1213   CL 102 11/29/2016 1054   CO2 26 05/24/2022 1213   CO2 30 11/29/2016 1054   CO2 26 12/29/2015 0954   BUN 19 05/24/2022 1213   BUN 22 11/29/2016 1054   BUN 20.0 12/29/2015 0954   CREATININE 1.27 (H) 05/24/2022 1213   CREATININE 1.2 11/29/2016 1054   CREATININE 1.3 12/29/2015 0954      Component Value Date/Time   CALCIUM 10.2 05/24/2022 1213   CALCIUM 9.3 11/29/2016 1054   CALCIUM 9.5 12/29/2015 0954   ALKPHOS 73 05/24/2022 1213   ALKPHOS 92 (H) 11/29/2016 1054   ALKPHOS 98 12/29/2015 0954   AST 21 05/24/2022 1213   AST 16 12/29/2015 0954   ALT 20 05/24/2022 1213   ALT 20 11/29/2016 1054   ALT 13 12/29/2015 0954   BILITOT 0.8 05/24/2022 1213   BILITOT 0.71 12/29/2015 0954      Impression and Plan: [name] is 82 year old  gentleman. He has secondary polycythemia/erythrocytosis. He is JAK2 negative.  Thankfully, he does not need to be phlebotomized.  I am just happy about this.  Again, I think we have a real problem.  Again he is incredibly jaundiced.  His bilirubin is 12.5.  He needs a CT scan.  We will see about that 1 of tomorrow for him.  Have to believe that he is going of having pancreatic cancer.  I would have to think, given the weight loss, hyperbilirubinemia, and back pain, that pancreatic cancer would be a possible diagnosis.  It is possible he may have some kind of stricture in the biliary duct.  Ultimately, he is going to end up in the hospital having to get this bilirubin corrected.  Again he does not want to go in because he has to take care of his wife.  She has her myriad of health issues.  I will likely end up seeing him when he goes into the hospital.  We will see what the CT scan shows.  We may have to get Gastroenterology involved.  Again, it would not surprise me if we found that he had pancreatic cancer.  Of note, his glucose is quite high.  This certainly would also go along with pancreatic cancer.  This is incredibly complicated.  I had no clue that he had any problems.  This took about 45 minutes to try to resolve.  Josph Macho, MD 8/28/20243:42 PM

## 2022-08-31 ENCOUNTER — Ambulatory Visit (HOSPITAL_BASED_OUTPATIENT_CLINIC_OR_DEPARTMENT_OTHER): Payer: Medicare HMO

## 2022-08-31 ENCOUNTER — Ambulatory Visit (HOSPITAL_BASED_OUTPATIENT_CLINIC_OR_DEPARTMENT_OTHER)
Admission: RE | Admit: 2022-08-31 | Discharge: 2022-08-31 | Disposition: A | Discharge status: Home / Self Care | Payer: Medicare HMO | Source: Ambulatory Visit | Attending: Hematology & Oncology | Admitting: Hematology & Oncology

## 2022-08-31 ENCOUNTER — Emergency Department (HOSPITAL_COMMUNITY): Payer: Medicare HMO

## 2022-08-31 ENCOUNTER — Encounter (HOSPITAL_COMMUNITY): Payer: Self-pay

## 2022-08-31 ENCOUNTER — Encounter: Payer: Self-pay | Admitting: Adult Health

## 2022-08-31 ENCOUNTER — Encounter (HOSPITAL_BASED_OUTPATIENT_CLINIC_OR_DEPARTMENT_OTHER): Payer: Self-pay

## 2022-08-31 ENCOUNTER — Other Ambulatory Visit: Payer: Self-pay

## 2022-08-31 ENCOUNTER — Inpatient Hospital Stay (HOSPITAL_COMMUNITY)
Admission: EM | Admit: 2022-08-31 | Discharge: 2022-09-03 | DRG: 445 | Disposition: A | Discharge status: Home / Self Care | Payer: Medicare HMO | Attending: Family Medicine | Admitting: Family Medicine

## 2022-08-31 DIAGNOSIS — Z86010 Personal history of colonic polyps: Secondary | ICD-10-CM

## 2022-08-31 DIAGNOSIS — Z79899 Other long term (current) drug therapy: Secondary | ICD-10-CM | POA: Diagnosis not present

## 2022-08-31 DIAGNOSIS — R7989 Other specified abnormal findings of blood chemistry: Secondary | ICD-10-CM

## 2022-08-31 DIAGNOSIS — R634 Abnormal weight loss: Secondary | ICD-10-CM

## 2022-08-31 DIAGNOSIS — K831 Obstruction of bile duct: Secondary | ICD-10-CM | POA: Diagnosis not present

## 2022-08-31 DIAGNOSIS — Z961 Presence of intraocular lens: Secondary | ICD-10-CM | POA: Diagnosis present

## 2022-08-31 DIAGNOSIS — I25110 Atherosclerotic heart disease of native coronary artery with unstable angina pectoris: Secondary | ICD-10-CM | POA: Diagnosis not present

## 2022-08-31 DIAGNOSIS — Z888 Allergy status to other drugs, medicaments and biological substances status: Secondary | ICD-10-CM | POA: Diagnosis not present

## 2022-08-31 DIAGNOSIS — Z9842 Cataract extraction status, left eye: Secondary | ICD-10-CM | POA: Diagnosis not present

## 2022-08-31 DIAGNOSIS — Z9841 Cataract extraction status, right eye: Secondary | ICD-10-CM

## 2022-08-31 DIAGNOSIS — N1831 Chronic kidney disease, stage 3a: Secondary | ICD-10-CM | POA: Diagnosis not present

## 2022-08-31 DIAGNOSIS — C259 Malignant neoplasm of pancreas, unspecified: Secondary | ICD-10-CM | POA: Diagnosis present

## 2022-08-31 DIAGNOSIS — Z7982 Long term (current) use of aspirin: Secondary | ICD-10-CM | POA: Diagnosis not present

## 2022-08-31 DIAGNOSIS — Z8249 Family history of ischemic heart disease and other diseases of the circulatory system: Secondary | ICD-10-CM

## 2022-08-31 DIAGNOSIS — Z7984 Long term (current) use of oral hypoglycemic drugs: Secondary | ICD-10-CM | POA: Diagnosis not present

## 2022-08-31 DIAGNOSIS — G8929 Other chronic pain: Secondary | ICD-10-CM | POA: Diagnosis present

## 2022-08-31 DIAGNOSIS — D45 Polycythemia vera: Secondary | ICD-10-CM

## 2022-08-31 DIAGNOSIS — I2583 Coronary atherosclerosis due to lipid rich plaque: Secondary | ICD-10-CM | POA: Diagnosis not present

## 2022-08-31 DIAGNOSIS — Z833 Family history of diabetes mellitus: Secondary | ICD-10-CM | POA: Diagnosis not present

## 2022-08-31 DIAGNOSIS — R059 Cough, unspecified: Secondary | ICD-10-CM | POA: Diagnosis present

## 2022-08-31 DIAGNOSIS — C250 Malignant neoplasm of head of pancreas: Secondary | ICD-10-CM | POA: Diagnosis not present

## 2022-08-31 DIAGNOSIS — I10 Essential (primary) hypertension: Secondary | ICD-10-CM | POA: Diagnosis not present

## 2022-08-31 DIAGNOSIS — E785 Hyperlipidemia, unspecified: Secondary | ICD-10-CM | POA: Diagnosis not present

## 2022-08-31 DIAGNOSIS — E1165 Type 2 diabetes mellitus with hyperglycemia: Secondary | ICD-10-CM | POA: Diagnosis present

## 2022-08-31 DIAGNOSIS — R001 Bradycardia, unspecified: Secondary | ICD-10-CM | POA: Diagnosis present

## 2022-08-31 DIAGNOSIS — K219 Gastro-esophageal reflux disease without esophagitis: Secondary | ICD-10-CM | POA: Diagnosis present

## 2022-08-31 DIAGNOSIS — Z800 Family history of malignant neoplasm of digestive organs: Secondary | ICD-10-CM

## 2022-08-31 DIAGNOSIS — K8689 Other specified diseases of pancreas: Secondary | ICD-10-CM | POA: Diagnosis not present

## 2022-08-31 DIAGNOSIS — I2510 Atherosclerotic heart disease of native coronary artery without angina pectoris: Secondary | ICD-10-CM

## 2022-08-31 DIAGNOSIS — Z681 Body mass index (BMI) 19 or less, adult: Secondary | ICD-10-CM | POA: Diagnosis not present

## 2022-08-31 DIAGNOSIS — R739 Hyperglycemia, unspecified: Secondary | ICD-10-CM | POA: Diagnosis not present

## 2022-08-31 LAB — COMPREHENSIVE METABOLIC PANEL
ALT: 352 U/L — ABNORMAL HIGH (ref 0–44)
AST: 160 U/L — ABNORMAL HIGH (ref 15–41)
Albumin: 3.6 g/dL (ref 3.5–5.0)
Alkaline Phosphatase: 387 U/L — ABNORMAL HIGH (ref 38–126)
Anion gap: 9 (ref 5–15)
BUN: 15 mg/dL (ref 8–23)
CO2: 25 mmol/L (ref 22–32)
Calcium: 9.4 mg/dL (ref 8.9–10.3)
Chloride: 99 mmol/L (ref 98–111)
Creatinine, Ser: 1.04 mg/dL (ref 0.61–1.24)
GFR, Estimated: >60 mL/min (ref >60)
Glucose, Bld: 274 mg/dL — ABNORMAL HIGH (ref 70–99)
Potassium: 4.4 mmol/L (ref 3.5–5.1)
Sodium: 133 mmol/L — ABNORMAL LOW (ref 135–145)
Total Bilirubin: 13.5 mg/dL — ABNORMAL HIGH (ref 0.3–1.2)
Total Protein: 6.7 g/dL (ref 6.5–8.1)

## 2022-08-31 LAB — CBC
HCT: 37.8 % — ABNORMAL LOW (ref 39.0–52.0)
Hemoglobin: 13.4 g/dL (ref 13.0–17.0)
MCH: 33.3 pg (ref 26.0–34.0)
MCHC: 35.4 g/dL (ref 30.0–36.0)
MCV: 93.8 fL (ref 80.0–100.0)
Platelets: 182 10*3/uL (ref 150–400)
RBC: 4.03 MIL/uL — ABNORMAL LOW (ref 4.22–5.81)
RDW: 15.9 % — ABNORMAL HIGH (ref 11.5–15.5)
WBC: 9.1 10*3/uL (ref 4.0–10.5)
nRBC: 0 % (ref 0.0–0.2)

## 2022-08-31 LAB — URINALYSIS, ROUTINE W REFLEX MICROSCOPIC
Bacteria, UA: NONE SEEN
Bilirubin Urine: NEGATIVE
Glucose, UA: >=500 mg/dL — AB
Hgb urine dipstick: NEGATIVE
Ketones, ur: NEGATIVE mg/dL
Leukocytes,Ua: NEGATIVE
Nitrite: NEGATIVE
Protein, ur: NEGATIVE mg/dL
Specific Gravity, Urine: 1.027 (ref 1.005–1.030)
pH: 5 (ref 5.0–8.0)

## 2022-08-31 LAB — IRON AND IRON BINDING CAPACITY (CC-WL,HP ONLY)
Iron: 103 ug/dL (ref 45–182)
Saturation Ratios: 34 % (ref 17.9–39.5)
TIBC: 305 ug/dL (ref 250–450)
UIBC: 202 ug/dL

## 2022-08-31 LAB — LIPASE, BLOOD: Lipase: 16 U/L (ref 11–51)

## 2022-08-31 MED ORDER — IOHEXOL 300 MG/ML  SOLN
100.0000 mL | Freq: Once | INTRAMUSCULAR | Status: AC | PRN
  Administered 2022-08-31: 100 mL via INTRAVENOUS

## 2022-08-31 MED ORDER — INSULIN ASPART 100 UNIT/ML IJ SOLN
0.0000 [IU] | INTRAMUSCULAR | Status: DC
  Administered 2022-09-01: 1 [IU] via SUBCUTANEOUS
  Administered 2022-09-01: 2 [IU] via SUBCUTANEOUS
  Administered 2022-09-01 – 2022-09-02 (×4): 5 [IU] via SUBCUTANEOUS
  Administered 2022-09-02: 2 [IU] via SUBCUTANEOUS
  Administered 2022-09-02: 1 [IU] via SUBCUTANEOUS
  Administered 2022-09-02 – 2022-09-03 (×2): 3 [IU] via SUBCUTANEOUS
  Filled 2022-08-31: qty 0.09

## 2022-08-31 NOTE — Assessment & Plan Note (Signed)
Apprecite oncology involvement  Check ca 19-9 level

## 2022-08-31 NOTE — ED Provider Notes (Signed)
[place] [facility] DEPARTMENT AT [facility] Provider Note  CSN: [id number] Arrival date & time: 08/31/22 1727  Chief Complaint(s) Abnormal Imagining   HPI Alexander [name] is a 82 y.o. male history of polycythemia vera, hypertension, hyperlipidemia presenting to the emergency department with abnormal imaging.  Patient recently had CT scan performed as outpatient with his oncologist who he sees for polycythemia vera, had noticed jaundiced.  He otherwise reports that he feels well.  Has had some unintentional weight loss, but no abdominal pain, nausea or vomiting, fevers or chills, back pain, dysuria, or any other new symptoms.  He reports he is still very active and swims daily.   Past Medical History Past Medical History:  Diagnosis Date   Arthritis    "minor; shoulders primarily" (05/20/2013)   CAD (coronary artery disease)    a. 05/2013 - Chest pain/unstable angina and dynamic EKG changes showing cath showing atherosclerotic coronary artery disease manifested as diffuse ectasia, normal EF.   Diverticulosis of colon    GERD (gastroesophageal reflux disease)    GI bleed    secondary to Aspirin   Gout    Hematuria, microscopic    Hemorrhoids    Hyperglycemia    Hyperlipidemia    Hypertension    Lumbar back pain    Microalbuminuria    Overweight(278.02)    Polycythemia rubra vera (HCC)    Tubular adenoma of colon 07/2012   Patient Active Problem List   Diagnosis Date Noted   Biliary obstruction 08/31/2022   Pancreatic mass 08/31/2022   TMJ syndrome 03/05/2017   Hemorrhoids 01/21/2014   CAD (coronary artery disease) 05/22/2013   Angina at rest 05/20/2013   Unstable angina (HCC) 05/20/2013   Allergic rhinitis 04/29/2010   GERD 10/05/2008   MICROALBUMINURIA 07/22/2008   BACK PAIN, LUMBAR 06/26/2008   MICROSCOPIC HEMATURIA 03/20/2007   Prediabetes 12/13/2006   Polycythemia vera (HCC) 11/26/2006   Hyperlipidemia 11/26/2006   Gout 11/26/2006   Essential  hypertension 11/26/2006   DIVERTICULOSIS, COLON 11/26/2006   INGUINAL PAIN, RIGHT 11/26/2006   COLONIC POLYPS, HX OF 11/26/2006   NEPHROLITHIASIS, HX OF 11/26/2006   Home Medication(s) Prior to Admission medications   Medication Sig Start Date End Date Taking? Authorizing Provider  allopurinol (ZYLOPRIM) 100 MG tablet TAKE ONE TABLET BY MOUTH DAILY 03/03/22  Yes Nafziger, Kandee Keen, NP  amLODipine (NORVASC) 10 MG tablet TAKE ONE TABLET BY MOUTH DAILY 03/21/22  Yes Nafziger, Kandee Keen, NP  aspirin EC 81 MG tablet Take 1 tablet (81 mg total) by mouth daily. 02/06/14  Yes Jake Bathe, MD  Coenzyme Q10 300 MG CAPS Take 1 capsule by mouth daily.    Yes [provider]  doxycycline (VIBRA-TABS) 100 MG tablet Take 1 tablet (100 mg total) by mouth 2 (two) times daily for 7 days. 08/30/22 09/06/22 Yes Nafziger, Kandee Keen, NP  glipiZIDE (GLUCOTROL XL) 2.5 MG 24 hr tablet Take 1 tablet (2.5 mg total) by mouth daily with breakfast. 08/25/22  Yes Nafziger, Kandee Keen, NP  hydrocortisone (ANUSOL-HC) 25 MG suppository Place 25 mg rectally daily as needed for hemorrhoids or anal itching.   Yes [provider]  lisinopril (ZESTRIL) 10 MG tablet TAKE ONE TABLET BY MOUTH DAILY 03/21/22  Yes Nafziger, Kandee Keen, NP  pantoprazole (PROTONIX) 40 MG tablet Take 1 tablet (40 mg total) by mouth daily. 03/23/22  Yes Nafziger, Kandee Keen, NP  pravastatin (PRAVACHOL) 40 MG tablet TAKE ONE TABLET BY MOUTH DAILY 03/03/22  Yes Nafziger, Kandee Keen, NP  PROCTO-MED HC 2.5 % rectal  cream APPLY 1 CREAM RECTALLY TWICE DAILY 08/09/22  Yes Nafziger, Kandee Keen, NP  Accu-Chek Softclix Lancets lancets Use to test blood glucose once daily 11/09/21   Nafziger, Kandee Keen, NP  Blood Glucose Calibration (EMBRACE PRO GLUCOSE CONTROL) LIQD Use to check blood glucose TID 04/19/22   Nafziger, Kandee Keen, NP  Blood Glucose Monitoring Suppl (ACCU-CHEK AVIVA PLUS) w/Device KIT Use to test blood glucose once daily 12/11/17   Nafziger, Kandee Keen, NP  glucose blood (EMBRACE PRO GLUCOSE TEST) test strip  Use as instructed 04/19/22   Shirline Frees, NP  PREVIDENT 5000 ENAMEL PROTECT 1.1-5 % PSTE See admin instructions. 02/15/18   [provider]                                                                                                                                    Past Surgical History Past Surgical History:  Procedure Laterality Date   CARDIAC CATHETERIZATION  05/20/2013   CATARACT EXTRACTION W/ INTRAOCULAR LENS  IMPLANT, BILATERAL Bilateral 2008   CYSTECTOMY  ~ 2000   " SEBACEOUS cyst removed from between shoulder blades"   EXCISIONAL HEMORRHOIDECTOMY  1970's   INGUINAL HERNIA REPAIR Right ~ 2010   LEFT HEART CATHETERIZATION WITH CORONARY ANGIOGRAM N/A 05/20/2013   Procedure: LEFT HEART CATHETERIZATION WITH CORONARY ANGIOGRAM;  Surgeon: Peter M [place], MD;  Location: [facility];  Service: Cardiovascular;  Laterality: N/A;   TONSILLECTOMY AND ADENOIDECTOMY  1940's   VASECTOMY     Family History Family History  Problem Relation Age of Onset   Colon cancer Mother        died at 17, colorectal   Diabetes Mother    Heart disease Father 18       MI   Diabetes Father    Stomach cancer Maternal Grandfather    Cancer Maternal Uncle     Social History Social History   Tobacco Use   Smoking status: Never   Smokeless tobacco: Never   Tobacco comments:    NEVER USED TOBACCO  Vaping Use   Vaping status: Never Used  Substance Use Topics   Alcohol use: No    Alcohol/week: 0.0 standard drinks of alcohol   Drug use: No   Allergies Metformin and related and Metformin hcl  Review of Systems Review of Systems  All other systems reviewed and are negative.   Physical Exam Vital Signs  I have reviewed the triage vital signs BP (!) 148/90 (BP Location: Left Arm)   Pulse (!) 52   Temp 98.8 F (37.1 C) (Oral)   Resp 13   Ht 5\' 11"  (1.803 m)   Wt 63.6 kg   SpO2 100%   BMI 19.55 kg/m  Physical Exam Vitals and nursing note reviewed.  Constitutional:       General: He is not in acute distress.    Appearance: Normal appearance.     Comments: Very thin  HENT:  Mouth/Throat:     Mouth: Mucous membranes are moist.  Eyes:     General: Scleral icterus present.     Conjunctiva/sclera: Conjunctivae normal.  Cardiovascular:     Rate and Rhythm: Normal rate and regular rhythm.  Pulmonary:     Effort: Pulmonary effort is normal. No respiratory distress.     Breath sounds: Normal breath sounds.  Abdominal:     General: Abdomen is flat.     Palpations: Abdomen is soft.     Tenderness: There is no abdominal tenderness.  Musculoskeletal:     Right lower leg: No edema.     Left lower leg: No edema.  Skin:    General: Skin is warm and dry.     Capillary Refill: Capillary refill takes less than 2 seconds.     Coloration: Skin is jaundiced.  Neurological:     Mental Status: He is alert and oriented to person, place, and time. Mental status is at baseline.  Psychiatric:        Mood and Affect: Mood normal.        Behavior: Behavior normal.     ED Results and Treatments Labs (all labs ordered are listed, but only abnormal results are displayed) Labs Reviewed  COMPREHENSIVE METABOLIC PANEL - Abnormal; Notable for the following components:      Result Value   Sodium 133 (*)    Glucose, Bld 274 (*)    AST 160 (*)    ALT 352 (*)    Alkaline Phosphatase 387 (*)    Total Bilirubin 13.5 (*)    All other components within normal limits  CBC - Abnormal; Notable for the following components:   RBC 4.03 (*)    HCT 37.8 (*)    RDW 15.9 (*)    All other components within normal limits  URINALYSIS, ROUTINE W REFLEX MICROSCOPIC - Abnormal; Notable for the following components:   Color, Urine AMBER (*)    Glucose, UA >=500 (*)    All other components within normal limits  LIPASE, BLOOD  HEMOGLOBIN A1C  CK  MAGNESIUM  PHOSPHORUS  PREALBUMIN  TSH  PROTIME-INR  CANCER ANTIGEN 19-9                                                                                                                           Radiology DG CHEST PORT 1 VIEW  Result Date: 08/31/2022 CLINICAL DATA:  10031 Cough 10031 EXAM: PORTABLE CHEST 1 VIEW.  Patient is rotated. COMPARISON:  Chest x-ray 08/22/2022 FINDINGS: The heart and mediastinal contours are unchanged. Aortic calcification. No focal consolidation. No pulmonary edema. No pleural effusion. No pneumothorax. No acute osseous abnormality. IMPRESSION: No active disease. Electronically Signed   By: Tish Frederickson M.D.   On: 08/31/2022 22:38   CT ABDOMEN PELVIS W CONTRAST  Result Date: 08/31/2022 CLINICAL DATA:  Unintentional weight loss of about 30 pounds in 3-4 months. Painless jaundice. EXAM: CT ABDOMEN AND PELVIS WITH CONTRAST TECHNIQUE: Multidetector  CT imaging of the abdomen and pelvis was performed using the standard protocol following bolus administration of intravenous contrast. RADIATION DOSE REDUCTION: This exam was performed according to the departmental dose-optimization program which includes automated exposure control, adjustment of the mA and/or kV according to patient size and/or use of iterative reconstruction technique. CONTRAST:  OMNIPAQUE IOHEXOL 300 MG/ML  SOLN COMPARISON:  Overlapping portions CT chest 06/05/2013 FINDINGS: Lower chest: Atheromatous vascular calcification of the right coronary artery and descending thoracic aorta. Hepatobiliary: Prominent intrahepatic and extrahepatic biliary dilatation extending down to the distal common bile duct in the vicinity of a pancreatic head mass. No definite focal metastatic lesions to the liver. Posterior right hepatic lobe cyst 1.5 by 1.0 cm on image 16 of series 301, no follow up of this lesion is indicated. Mildly distended gallbladder. Pancreas: Severe dorsal pancreatic duct dilatation extending to a pancreatic head mass strongly favoring adenocarcinoma. Size: 3.0 by 1.8 by 2.6 cm Location: Pancreatic head Characterization: Solid Enhancement:  Hypoenhancing on portal venous phase images. Other Characteristics: Severe dorsal pancreatic duct dilatation Local extent of mass: No invasion of the duodenum. Vascular Involvement: The mass minimally abuts the posterior margin of the SMV without invasion. No involvement of the celiac trunk, common hepatic artery, gastroduodenal artery, or proper hepatic artery. Variant hepatic artery anatomy: Conventional Bile Duct Involvement: Marked narrowing of the distal CBD in the vicinity of the ampulla suggesting potential ampullary involvement by tumor. Variant biliary anatomy: No Adjacent Nodes: Absent Omental/Peritoneal Disease: Absent Distant Metastases: Absent Spleen: Unremarkable Adrenals/Urinary Tract: Fullness of the adrenal glands without mass. 3.2 by 1.8 cm homogeneous hyperdense lesion of the left kidney posteriorly on image 27 series 301, internal density 50 Hounsfield units, likely a complex cyst given the homogeneity, but technically nonspecific. This and the pancreatic lesion could be further characterized with dedicated pancreatic protocol MRI with and without contrast. Stomach/Bowel: Sigmoid colon diverticulosis. Lower rectal and anal wall thickening circumferentially, likely from nondistention but technically nonspecific, correlate with colon cancer screening history. Vascular/Lymphatic: Atherosclerosis is present, including aortoiliac atherosclerotic disease. Substantial gastric collateral vessels. Dorsal atheromatous plaque at the origin of the celiac trunk and SMA without occlusion. There is also plaque further distally in the superior mesenteric artery. The IMA appears patent. No pathologic adenopathy observed. Reproductive: Unremarkable Other: No supplemental non-categorized findings. Musculoskeletal: Fatty right spermatic cord. Bilateral mild foraminal impingement at L5-S1 due to intervertebral and facet spurring. IMPRESSION: 1. 3 cm pancreatic head mass strongly favoring pancreatic adenocarcinoma.  There is severe dorsal pancreatic duct dilatation and prominent intrahepatic and extrahepatic biliary dilatation extending down to the distal common bile duct in the vicinity of the pancreatic head mass. Other characteristics detailed above. 2. 3.2 by 1.8 cm homogeneous hyperdense lesion of the left kidney posteriorly, internal density 50 Hounsfield units, likely a complex cyst given the homogeneity, but technically nonspecific. This and the pancreatic lesion could be further characterized with dedicated pancreatic protocol MRI with and without contrast. 3. Sigmoid colon diverticulosis. 4. Lower rectal and anal wall thickening circumferentially, likely from nondistention but technically nonspecific, correlate with colon cancer screening history. 5. Bilateral mild foraminal impingement at L5-S1 due to intervertebral and facet spurring. 6. Aortic atherosclerosis. Aortic Atherosclerosis (ICD10-I70.0). Electronically Signed   By: Gaylyn Rong M.D.   On: 08/31/2022 15:09    Pertinent labs & imaging results that were available during my care of the patient were reviewed by me and considered in my medical decision making (see MDM for details).  Medications Ordered in ED Medications  insulin aspart (novoLOG) injection 0-9 Units (has no administration in time range)                                                                                                                                     Procedures Procedures  (including critical care time)  Medical Decision Making / ED Course   MDM:  83 year old male presenting to the emergency department with abnormal imaging.  Patient overall well-appearing, physical exam with jaundice, but no abdominal tenderness.  Reviewed CT scan shows pancreatic mass.  Likely biliary obstruction.  Doubt cholangitis, no fevers or chills, tenderness.  Labs with some mild LFT abnormalities likely due to biliary obstruction.  Patient also has some hyperglycemia,  possibly new onset diabetes from his pancreatic mass.  Patient is overall very well-appearing and will discuss with GI whether he needs any further inpatient workup.  Clinical Course as of 08/31/22 2309  Thu Aug 31, 2022  2308 Discussed with Dr. Tomasa Rand who recommends admission for ERCP. Discussed with Dr. Adela Glimpse who will admit patient.  [WS]    Clinical Course User Index [WS] Lonell Grandchild, MD     Additional history obtained: -Additional history obtained from family -External records from outside source obtained and reviewed including: Chart review including previous notes, labs, imaging, consultation notes including oncology notes    Lab Tests: -I ordered, reviewed, and interpreted labs.   The pertinent results include:   Labs Reviewed  COMPREHENSIVE METABOLIC PANEL - Abnormal; Notable for the following components:      Result Value   Sodium 133 (*)    Glucose, Bld 274 (*)    AST 160 (*)    ALT 352 (*)    Alkaline Phosphatase 387 (*)    Total Bilirubin 13.5 (*)    All other components within normal limits  CBC - Abnormal; Notable for the following components:   RBC 4.03 (*)    HCT 37.8 (*)    RDW 15.9 (*)    All other components within normal limits  URINALYSIS, ROUTINE W REFLEX MICROSCOPIC - Abnormal; Notable for the following components:   Color, Urine AMBER (*)    Glucose, UA >=500 (*)    All other components within normal limits  LIPASE, BLOOD  HEMOGLOBIN A1C  CK  MAGNESIUM  PHOSPHORUS  PREALBUMIN  TSH  PROTIME-INR  CANCER ANTIGEN 19-9    Notable for LFT abnormalities, hyperbilirubinemia     Medicines ordered and prescription drug management: Meds ordered this encounter  Medications   insulin aspart (novoLOG) injection 0-9 Units    Order Specific Question:   Correction coverage:    Answer:   Sensitive (thin, NPO, renal)    Order Specific Question:   CBG < 70:    Answer:   Implement Hypoglycemia Standing Orders and refer to Hypoglycemia  Standing Orders sidebar report    Order Specific Question:   CBG 70 - 120:  Answer:   0 units    Order Specific Question:   CBG 121 - 150:    Answer:   1 unit    Order Specific Question:   CBG 151 - 200:    Answer:   2 units    Order Specific Question:   CBG 201 - 250:    Answer:   3 units    Order Specific Question:   CBG 251 - 300:    Answer:   5 units    Order Specific Question:   CBG 301 - 350:    Answer:   7 units    Order Specific Question:   CBG 351 - 400    Answer:   9 units    Order Specific Question:   CBG > 400    Answer:   call MD and obtain STAT lab verification    -I have reviewed the patients home medicines and have made adjustments as needed   Consultations Obtained: I requested consultation with the gastroenterologist,  and discussed lab and imaging findings as well as pertinent plan - they recommend: admission   Cardiac Monitoring: The patient was maintained on a cardiac monitor.  I personally viewed and interpreted the cardiac monitored which showed an underlying rhythm of: NSR    Co morbidities that complicate the patient evaluation  Past Medical History:  Diagnosis Date   Arthritis    "minor; shoulders primarily" (05/20/2013)   CAD (coronary artery disease)    a. 05/2013 - Chest pain/unstable angina and dynamic EKG changes showing cath showing atherosclerotic coronary artery disease manifested as diffuse ectasia, normal EF.   Diverticulosis of colon    GERD (gastroesophageal reflux disease)    GI bleed    secondary to Aspirin   Gout    Hematuria, microscopic    Hemorrhoids    Hyperglycemia    Hyperlipidemia    Hypertension    Lumbar back pain    Microalbuminuria    Overweight(278.02)    Polycythemia rubra vera (HCC)    Tubular adenoma of colon 07/2012      Dispostion: Disposition decision including need for hospitalization was considered, and patient admitted to the hospital.    Final Clinical Impression(s) / ED Diagnoses Final  diagnoses:  Biliary obstruction     This chart was dictated using voice recognition software.  Despite best efforts to proofread,  errors can occur which can change the documentation meaning.    Lonell Grandchild, MD 08/31/22 [phone]

## 2022-08-31 NOTE — Assessment & Plan Note (Signed)
Restrt Norvasc 10 mg po q day if bp allows

## 2022-08-31 NOTE — Telephone Encounter (Signed)
See phone note

## 2022-08-31 NOTE — H&P (Signed)
Alexander [name] ZOX:[id number] DOB: [birth year] DOA: 08/31/2022     PCP: Shirline Frees, NP   Outpatient Specialists:  [facility]  Dr. Donato Schultz, MD    Oncology   Dr. Myna Hidalgo    Patient arrived to ER on 08/31/22 at 1727 Referred by Attending Lonell Grandchild, MD   Patient coming from:    home Lives With family      Chief Complaint:   Chief Complaint  Patient presents with   Abnormal Imagining     HPI: Alexander [name] is a 82 y.o. male with medical history significant of Dm2, polycythemia vera, HTN, CAD,  GERD history of GI bleed secondary to aspirin, gout, hemorrhoids, back pain chronic    Presented with   abnormal CT abd Patient with significant weight loss and recent diagnosed pancreatic mass that now appears to be obstructing His urine started to be dark he presented to oncology yesterday was noted to have bilirubin up to 12.5 and elevated LFTs patient was recommended to go to ER but he declined at the time because he has to take care of his wife He had recent respiratory illness and was given some steroids Denies nausea vomiting or pruritus no bleeding CT scan was done showed a 3 cm pancreatic head mass concerning for pancreatic adenocarcinoma with prominent intrahepatic and extrahepatic biliary dilatation. Dr. Myna Hidalgo discussed case with [facility] plan was for patient to be admitted to have ERCP done   No pain no fer no chills   Denies significant ETOH intake   Does not smoke   No results found for: "SARSCOV2NAA"      Regarding pertinent Chronic problems:     Hyperlipidemia -  on statins Pravachol Lipid Panel     Component Value Date/Time   CHOL 170 03/16/2021 1035   TRIG 183.0 (H) 03/16/2021 1035   HDL 47.70 03/16/2021 1035   CHOLHDL 4 03/16/2021 1035   VLDL 36.6 03/16/2021 1035   LDLCALC 85 03/16/2021 1035     HTN on Norvasc, lisinopril      CAD  - On Aspirin, statin,                  -  followed by cardiology                    DM 2 -  Lab  Results  Component Value Date   HGBA1C 6.4 (A) 06/16/2022   on PO meds only    CKD stage IIIa-   baseline Cr 1.3 Estimated Creatinine Clearance: 49.3 mL/min (by C-G formula based on SCr of 1.04 mg/dL).  Lab Results  Component Value Date   CREATININE 1.04 08/31/2022   CREATININE 1.45 (H) 08/30/2022   CREATININE 1.27 (H) 05/24/2022   Lab Results  Component Value Date   NA 133 (L) 08/31/2022   CL 99 08/31/2022   K 4.4 08/31/2022   CO2 25 08/31/2022   BUN 15 08/31/2022   CREATININE 1.04 08/31/2022   GFRNONAA >60 08/31/2022   CALCIUM 9.4 08/31/2022   ALBUMIN 3.6 08/31/2022   GLUCOSE 274 (H) 08/31/2022     While in ER: Clinical Course as of 08/31/22 2308  Thu Aug 31, 2022  2308 Discussed with Dr. Tomasa Rand who recommends admission for ERCP. Discussed with Dr. Adela Glimpse who will admit patient.  [WS]    Clinical Course User Index [WS] Lonell Grandchild, MD   Noted to have mild hyponatremia elevated LFTs total bili now up to 13.5  Lab Orders         Lipase, blood         Comprehensive metabolic panel         CBC         Urinalysis, Routine w reflex microscopic -Urine, Clean Catch       CTabd/pelvis - . 3 cm pancreatic head mass strongly favoring pancreatic adenocarcinoma. There is severe dorsal pancreatic duct dilatation and prominent intrahepatic and extrahepatic biliary dilatation extending down to the distal common bile duct in the vicinity of the pancreatic head mass. Other characteristics detailed above. 2. 3.2 by 1.8 cm homogeneous hyperdense lesion of the left kidney posteriorly, internal density 50 Hounsfield units, likely a complex cyst given the homogeneity, but technically nonspecific. This and the pancreatic lesion could be further characterized with dedicated pancreatic protocol MRI with and without contrast. 3. Sigmoid colon diverticulosis. 4. Lower rectal and anal wall thickening circumferentially, likely from nondistention but technically  nonspecific, correlate with colon cancer screening history. 5. Bilateral mild foraminal impingement at L5-S1 due to intervertebral and facet spurring. 6. Aortic atherosclerosis.   Following Medications were ordered in ER: Medications - No data to display  _____     ED Triage Vitals  Encounter Vitals Group     BP 08/31/22 1744 (!) 149/76     Systolic BP Percentile --      Diastolic BP Percentile --      Pulse Rate 08/31/22 1744 (!) 53     Resp 08/31/22 1744 15     Temp 08/31/22 1744 98.8 F (37.1 C)     Temp Source 08/31/22 1744 Oral     SpO2 08/31/22 1744 100 %     Weight 08/31/22 1745 140 lb 3.2 oz (63.6 kg)     Height 08/31/22 1745 5\' 11"  (1.803 m)     Head Circumference --      Peak Flow --      Pain Score 08/31/22 1745 0     Pain Loc --      Pain Education --      Exclude from Growth Chart --   VFIE(33)@     _________________________________________ Significant initial  Findings: Abnormal Labs Reviewed  COMPREHENSIVE METABOLIC PANEL - Abnormal; Notable for the following components:      Result Value   Sodium 133 (*)    Glucose, Bld 274 (*)    AST 160 (*)    ALT 352 (*)    Alkaline Phosphatase 387 (*)    Total Bilirubin 13.5 (*)    All other components within normal limits  CBC - Abnormal; Notable for the following components:   RBC 4.03 (*)    HCT 37.8 (*)    RDW 15.9 (*)    All other components within normal limits  URINALYSIS, ROUTINE W REFLEX MICROSCOPIC - Abnormal; Notable for the following components:   Color, Urine AMBER (*)    Glucose, UA >=500 (*)    All other components within normal limits       ECG: Ordered Personally reviewed and interpreted by me showing: HR : 63 Rhythm: Sinus rhythm Supraventricular bigeminy Borderline right axis deviation Borderline low voltage, extremity leads QTC 397     The recent clinical data is shown below. Vitals:   08/31/22 1744 08/31/22 1745 08/31/22 2104  BP: (!) 149/76  (!) 148/90  Pulse: (!) 53  (!) 52   Resp: 15  13  Temp: 98.8 F (37.1 C)  98.8 F (37.1 C)  TempSrc: Oral  Oral  SpO2: 100%  100%  Weight:  63.6 kg   Height:  5\' 11"  (1.803 m)      WBC     Component Value Date/Time   WBC 9.1 08/31/2022 1801   LYMPHSABS 1.3 08/30/2022 1500   LYMPHSABS 1.8 11/29/2016 1054   LYMPHSABS 1.8 09/18/2007 0836   MONOABS 1.0 08/30/2022 1500   MONOABS 1.0 (H) 09/18/2007 0836   EOSABS 0.1 08/30/2022 1500   EOSABS 0.3 11/29/2016 1054   BASOSABS 0.1 08/30/2022 1500   BASOSABS 0.0 11/29/2016 1054   BASOSABS 0.0 09/18/2007 0836      UA   no evidence of UTI      Urine analysis:    Component Value Date/Time   COLORURINE AMBER (A) 08/31/2022 1750   APPEARANCEUR CLEAR 08/31/2022 1750   LABSPEC 1.027 08/31/2022 1750   PHURINE 5.0 08/31/2022 1750   GLUCOSEU >=500 (A) 08/31/2022 1750   GLUCOSEU >=1000 (A) 08/22/2022 1601   HGBUR NEGATIVE 08/31/2022 1750   BILIRUBINUR NEGATIVE 08/31/2022 1750   BILIRUBINUR 1+ 06/04/2017 1207   KETONESUR NEGATIVE 08/31/2022 1750   PROTEINUR NEGATIVE 08/31/2022 1750   UROBILINOGEN 0.2 08/22/2022 1601   NITRITE NEGATIVE 08/31/2022 1750   LEUKOCYTESUR NEGATIVE 08/31/2022 1750    Results for orders placed or performed in visit on 06/04/17  Urine culture     Status: None   Collection Time: 06/04/17 12:48 PM   Specimen: Blood  Result Value Ref Range Status   MICRO NUMBER: [id number]  Final   SPECIMEN QUALITY: ADEQUATE  Final   Sample Source URINE, CLEAN CATCH  Final   STATUS: FINAL  Final   Result:   Final    Single organism less than 10,000 CFU/mL isolated. These organisms, commonly found on external and internal genitalia, are considered colonizers. No further testing performed.   *Note: Due to a large number of results and/or encounters for the requested time period, some results have not been displayed. A complete set of results can be found in Results Review.   ____________________________________________ Recent Labs  Lab 08/30/22 1500  08/31/22 1801  NA 135 133*  K 4.8 4.4  CO2 27 25  GLUCOSE 314* 274*  BUN 15 15  CREATININE 1.45* 1.04  CALCIUM 9.5 9.4    Cr    stable,  Lab Results  Component Value Date   CREATININE 1.04 08/31/2022   CREATININE 1.45 (H) 08/30/2022   CREATININE 1.27 (H) 05/24/2022    Recent Labs  Lab 08/30/22 1500 08/31/22 1801  AST 176* 160*  ALT 347* 352*  ALKPHOS 440* 387*  BILITOT 12.5* 13.5*  PROT 6.2* 6.7  ALBUMIN 4.1 3.6   Lab Results  Component Value Date   CALCIUM 9.4 08/31/2022     Plt: Lab Results  Component Value Date   PLT 182 08/31/2022       Recent Labs  Lab 08/30/22 1500 08/31/22 1801  WBC 8.0 9.1  NEUTROABS 5.5  --   HGB 13.5 13.4  HCT 39.1 37.8*  MCV 93.1 93.8  PLT 171 182    HG/HCT  stable,      Component Value Date/Time   HGB 13.4 08/31/2022 1801   HGB 13.5 08/30/2022 1500   HGB 14.8 11/29/2016 1054   HGB 16.1 09/18/2007 0836   HCT 37.8 (L) 08/31/2022 1801   HCT 44.3 11/29/2016 1054   HCT 47.3 09/18/2007 0836   MCV 93.8 08/31/2022 1801   MCV 88 11/29/2016 1054   MCV 94.3 09/18/2007 0836  Recent Labs  Lab 08/31/22 1801  LIPASE 16    _______________________________________________ Hospitalist was called for admission for pancreatic mass with biliary obstruction   The following Work up has been ordered so far:  Orders Placed This Encounter  Procedures   Lipase, blood   Comprehensive metabolic panel   CBC   Urinalysis, Routine w reflex microscopic -Urine, Clean Catch   Diet regular Room service appropriate? Yes; Fluid consistency: Thin   Consult to gastroenterology   Consult to hospitalist   ED EKG     OTHER Significant initial  Findings:  labs showing:     DM  labs:  HbA1C: Recent Labs    12/14/21 1106 03/21/22 1537 06/16/22 1314  HGBA1C 6.6* 8.1* 6.4*       CBG (last 3)  No results for input(s): "GLUCAP" in the last 72 hours.        Cultures: No results found for: "SDES", "SPECREQUEST", "CULT",  "REPTSTATUS"   Radiological Exams on Admission: CT ABDOMEN PELVIS W CONTRAST  Result Date: 08/31/2022 CLINICAL DATA:  Unintentional weight loss of about 30 pounds in 3-4 months. Painless jaundice. EXAM: CT ABDOMEN AND PELVIS WITH CONTRAST TECHNIQUE: Multidetector CT imaging of the abdomen and pelvis was performed using the standard protocol following bolus administration of intravenous contrast. RADIATION DOSE REDUCTION: This exam was performed according to the departmental dose-optimization program which includes automated exposure control, adjustment of the mA and/or kV according to patient size and/or use of iterative reconstruction technique. CONTRAST:  OMNIPAQUE IOHEXOL 300 MG/ML  SOLN COMPARISON:  Overlapping portions CT chest 06/05/2013 FINDINGS: Lower chest: Atheromatous vascular calcification of the right coronary artery and descending thoracic aorta. Hepatobiliary: Prominent intrahepatic and extrahepatic biliary dilatation extending down to the distal common bile duct in the vicinity of a pancreatic head mass. No definite focal metastatic lesions to the liver. Posterior right hepatic lobe cyst 1.5 by 1.0 cm on image 16 of series 301, no follow up of this lesion is indicated. Mildly distended gallbladder. Pancreas: Severe dorsal pancreatic duct dilatation extending to a pancreatic head mass strongly favoring adenocarcinoma. Size: 3.0 by 1.8 by 2.6 cm Location: Pancreatic head Characterization: Solid Enhancement: Hypoenhancing on portal venous phase images. Other Characteristics: Severe dorsal pancreatic duct dilatation Local extent of mass: No invasion of the duodenum. Vascular Involvement: The mass minimally abuts the posterior margin of the SMV without invasion. No involvement of the celiac trunk, common hepatic artery, gastroduodenal artery, or proper hepatic artery. Variant hepatic artery anatomy: Conventional Bile Duct Involvement: Marked narrowing of the distal CBD in the vicinity of the  ampulla suggesting potential ampullary involvement by tumor. Variant biliary anatomy: No Adjacent Nodes: Absent Omental/Peritoneal Disease: Absent Distant Metastases: Absent Spleen: Unremarkable Adrenals/Urinary Tract: Fullness of the adrenal glands without mass. 3.2 by 1.8 cm homogeneous hyperdense lesion of the left kidney posteriorly on image 27 series 301, internal density 50 Hounsfield units, likely a complex cyst given the homogeneity, but technically nonspecific. This and the pancreatic lesion could be further characterized with dedicated pancreatic protocol MRI with and without contrast. Stomach/Bowel: Sigmoid colon diverticulosis. Lower rectal and anal wall thickening circumferentially, likely from nondistention but technically nonspecific, correlate with colon cancer screening history. Vascular/Lymphatic: Atherosclerosis is present, including aortoiliac atherosclerotic disease. Substantial gastric collateral vessels. Dorsal atheromatous plaque at the origin of the celiac trunk and SMA without occlusion. There is also plaque further distally in the superior mesenteric artery. The IMA appears patent. No pathologic adenopathy observed. Reproductive: Unremarkable Other: No supplemental non-categorized findings. Musculoskeletal: Fatty  right spermatic cord. Bilateral mild foraminal impingement at L5-S1 due to intervertebral and facet spurring. IMPRESSION: 1. 3 cm pancreatic head mass strongly favoring pancreatic adenocarcinoma. There is severe dorsal pancreatic duct dilatation and prominent intrahepatic and extrahepatic biliary dilatation extending down to the distal common bile duct in the vicinity of the pancreatic head mass. Other characteristics detailed above. 2. 3.2 by 1.8 cm homogeneous hyperdense lesion of the left kidney posteriorly, internal density 50 Hounsfield units, likely a complex cyst given the homogeneity, but technically nonspecific. This and the pancreatic lesion could be further  characterized with dedicated pancreatic protocol MRI with and without contrast. 3. Sigmoid colon diverticulosis. 4. Lower rectal and anal wall thickening circumferentially, likely from nondistention but technically nonspecific, correlate with colon cancer screening history. 5. Bilateral mild foraminal impingement at L5-S1 due to intervertebral and facet spurring. 6. Aortic atherosclerosis. Aortic Atherosclerosis (ICD10-I70.0). Electronically Signed   By: Gaylyn Rong M.D.   On: 08/31/2022 15:09   _______________________________________________________________________________________________________ Latest  Blood pressure (!) 148/90, pulse (!) 52, temperature 98.8 F (37.1 C), temperature source Oral, resp. rate 13, height 5\' 11"  (1.803 m), weight 63.6 kg, SpO2 100%.   Vitals  labs and radiology finding personally reviewed  Review of Systems:    Pertinent positives include: jaundice  Constitutional:  No weight loss, night sweats, Fevers, chills, fatigue, weight loss  HEENT:  No headaches, Difficulty swallowing,Tooth/dental problems,Sore throat,  No sneezing, itching, ear ache, nasal congestion, post nasal drip,  Cardio-vascular:  No chest pain, Orthopnea, PND, anasarca, dizziness, palpitations.no Bilateral lower extremity swelling  GI:  No heartburn, indigestion, abdominal pain, nausea, vomiting, diarrhea, change in bowel habits, loss of appetite, melena, blood in stool, hematemesis Resp:  no shortness of breath at rest. No dyspnea on exertion, No excess mucus, no productive cough, No non-productive cough, No coughing up of blood.No change in color of mucus.No wheezing. Skin:  no rash or lesions. No jaundice GU:  no dysuria, change in color of urine, no urgency or frequency. No straining to urinate.  No flank pain.  Musculoskeletal:  No joint pain or no joint swelling. No decreased range of motion. No back pain.  Psych:  No change in mood or affect. No depression or anxiety. No  memory loss.  Neuro: no localizing neurological complaints, no tingling, no weakness, no double vision, no gait abnormality, no slurred speech, no confusion  All systems reviewed and apart from HOPI all are negative _______________________________________________________________________________________________ Past Medical History:   Past Medical History:  Diagnosis Date   Arthritis    "minor; shoulders primarily" (05/20/2013)   CAD (coronary artery disease)    a. 05/2013 - Chest pain/unstable angina and dynamic EKG changes showing cath showing atherosclerotic coronary artery disease manifested as diffuse ectasia, normal EF.   Diverticulosis of colon    GERD (gastroesophageal reflux disease)    GI bleed    secondary to Aspirin   Gout    Hematuria, microscopic    Hemorrhoids    Hyperglycemia    Hyperlipidemia    Hypertension    Lumbar back pain    Microalbuminuria    Overweight(278.02)    Polycythemia rubra vera (HCC)    Tubular adenoma of colon 07/2012      Past Surgical History:  Procedure Laterality Date   CARDIAC CATHETERIZATION  05/20/2013   CATARACT EXTRACTION W/ INTRAOCULAR LENS  IMPLANT, BILATERAL Bilateral 2008   CYSTECTOMY  ~ 2000   " SEBACEOUS cyst removed from between shoulder blades"   EXCISIONAL HEMORRHOIDECTOMY  1970's  INGUINAL HERNIA REPAIR Right ~ 2010   LEFT HEART CATHETERIZATION WITH CORONARY ANGIOGRAM N/A 05/20/2013   Procedure: LEFT HEART CATHETERIZATION WITH CORONARY ANGIOGRAM;  Surgeon: Peter M Swaziland, MD;  Location: [facility] CATH [facility];  Service: Cardiovascular;  Laterality: N/A;   TONSILLECTOMY AND ADENOIDECTOMY  1940's   VASECTOMY      Social History:  Ambulatory   independently       reports that he has never smoked. He has never used smokeless tobacco. He reports that he does not drink alcohol and does not use drugs.   Family History:   Family History  Problem Relation Age of Onset   Colon cancer Mother        died at 64, colorectal   Diabetes  Mother    Heart disease Father 9       MI   Diabetes Father    Stomach cancer Maternal Grandfather    Cancer Maternal Uncle    ______________________________________________________________________________________________ Allergies: Allergies  Allergen Reactions   Metformin And Related Diarrhea   Metformin Hcl Diarrhea     Prior to Admission medications   Medication Sig Start Date End Date Taking? Authorizing Provider  allopurinol (ZYLOPRIM) 100 MG tablet TAKE ONE TABLET BY MOUTH DAILY 03/03/22  Yes Nafziger, Kandee Keen, NP  amLODipine (NORVASC) 10 MG tablet TAKE ONE TABLET BY MOUTH DAILY 03/21/22  Yes Nafziger, Kandee Keen, NP  aspirin EC 81 MG tablet Take 1 tablet (81 mg total) by mouth daily. 02/06/14  Yes Jake Bathe, MD  Coenzyme Q10 300 MG CAPS Take 1 capsule by mouth daily.    Yes [provider]  doxycycline (VIBRA-TABS) 100 MG tablet Take 1 tablet (100 mg total) by mouth 2 (two) times daily for 7 days. 08/30/22 09/06/22 Yes Nafziger, Kandee Keen, NP  glipiZIDE (GLUCOTROL XL) 2.5 MG 24 hr tablet Take 1 tablet (2.5 mg total) by mouth daily with breakfast. 08/25/22  Yes Nafziger, Kandee Keen, NP  hydrocortisone (ANUSOL-HC) 25 MG suppository Place 25 mg rectally daily as needed for hemorrhoids or anal itching.   Yes [provider]  lisinopril (ZESTRIL) 10 MG tablet TAKE ONE TABLET BY MOUTH DAILY 03/21/22  Yes Nafziger, Kandee Keen, NP  pantoprazole (PROTONIX) 40 MG tablet Take 1 tablet (40 mg total) by mouth daily. 03/23/22  Yes Nafziger, Kandee Keen, NP  pravastatin (PRAVACHOL) 40 MG tablet TAKE ONE TABLET BY MOUTH DAILY 03/03/22  Yes Nafziger, Kandee Keen, NP  PROCTO-MED HC 2.5 % rectal cream APPLY 1 CREAM RECTALLY TWICE DAILY 08/09/22  Yes Nafziger, Kandee Keen, NP  Accu-Chek Softclix Lancets lancets Use to test blood glucose once daily 11/09/21   Nafziger, Kandee Keen, NP  Blood Glucose Calibration (EMBRACE PRO GLUCOSE CONTROL) LIQD Use to check blood glucose TID 04/19/22   Nafziger, Kandee Keen, NP  Blood Glucose Monitoring Suppl  (ACCU-CHEK AVIVA PLUS) w/Device KIT Use to test blood glucose once daily 12/11/17   Nafziger, Kandee Keen, NP  glucose blood (EMBRACE PRO GLUCOSE TEST) test strip Use as instructed 04/19/22   Shirline Frees, NP  PREVIDENT 5000 ENAMEL PROTECT 1.1-5 % PSTE See admin instructions. 02/15/18   [provider]    ___________________________________________________________________________________________________ Physical Exam:    08/31/2022    9:04 PM 08/31/2022    5:45 PM 08/31/2022    5:44 PM  Vitals with BMI  Height  5\' 11"    Weight  140 lbs 3 oz   BMI  19.56   Systolic 148  149  Diastolic 90  76  Pulse 52  53     1. General:  in No  Acute distress   Chronically ill-appearing 2. Psychological: Alert and   Oriented 3. Head/ENT:    Dry Mucous Membranes                          Head Non traumatic, neck supple                       Poor Dentition 4. SKIN: decreased Skin turgor,  Skin clean Dry and intact no rash    5. Heart: Regular rate and rhythm no  Murmur, no Rub or gallop 6. Lungs: no wheezes or crackles   7. Abdomen: Soft,  non-tender, Non distended   bowel sounds present 8. Lower extremities: no clubbing, cyanosis, no  edema 9. Neurologically Grossly intact, moving all 4 extremities equally   10. MSK: Normal range of motion    Chart has been reviewed  ______________________________________________________________________________________________  Assessment/Plan 82 y.o. male with medical history significant of Dm2, polycythemia vera, HTN, CAD, GERD history of GI bleed secondary to aspirin, gout, hemorrhoids, back pain chronic   Admitted for  pancreatic mass with billiary obstruction    Present on Admission:  Biliary obstruction  Polycythemia vera (HCC)  Hyperlipidemia  Essential hypertension  CAD (coronary artery disease)  Pancreatic mass     Biliary obstruction [facility] IS aware Just in case there is an  availability for ERCP tomorrow Will change to clears after  MN     Polycythemia vera (HCC) Chronic stable  Hyperlipidemia Hold statin given elevated LFT  Essential hypertension Restrt Norvasc 10 mg po q day if bp allows    CAD (coronary artery disease) Hold aspirin and statin for now   Pancreatic mass Apprecite oncology involvement  Check ca 19-9 level   Other plan as per orders.  DVT prophylaxis:  SCD      Code Status:    Code Status: Prior FULL CODE  as per patient   I had personally discussed CODE STATUS with patient and family  ACP   none    Family Communication:   Family   at  Bedside  plan of care was discussed   with   Wife,   Diet  Diet Orders (From admission, onward)     Start     Ordered   08/31/22 2122  Diet regular Room service appropriate? Yes; Fluid consistency: Thin  Diet effective now       Question Answer Comment  Room service appropriate? Yes   Fluid consistency: Thin      08/31/22 2121            Disposition Plan:     To home once workup is complete and patient is stable   Following barriers for discharge:                                                         Will need consultants to evaluate patient prior to discharge       Consult Orders  (From admission, onward)           Start     Ordered   08/31/22 2122  Consult to hospitalist  Once       Provider:  (Not yet assigned)  Question Answer Comment  Place call to: [facility]   Reason for Consult Admit      08/31/22 2122                                Consults called: Oncology and [facility] aware   Admission status:  ED Disposition     ED Disposition  Admit   Condition  --   Comment  The patient appears reasonably stabilized for admission considering the current resources, flow, and capabilities available in the ED at this time, and I doubt any other [facility] requiring further screening and/or treatment in the ED prior to admission is  present.           inpatient     I Expect 2 midnight stay secondary to severity  of patient's current illness need for inpatient interventions justified by the following:    Severe lab/radiological/exam abnormalities including:   Pancreatic mass with obstruction and extensive comorbidities including:  DM2   That are currently affecting medical management.   I expect  patient to be hospitalized for 2 midnights requiring inpatient medical care.  Patient is at high risk for adverse outcome (such as loss of life or disability) if not treated.  Indication for inpatient stay as follows:    Need for operative/procedural  intervention     Level of care     tele  For 12H     Vale Peraza 08/31/2022, 11:08 PM    [facility]     after 2 AM please page floor coverage PA If 7AM-7PM, please contact the day team taking care of the patient using [email or web address]

## 2022-08-31 NOTE — Assessment & Plan Note (Signed)
Hold statin given elevated LFT

## 2022-08-31 NOTE — Assessment & Plan Note (Signed)
[facility] IS aware Just in case there is an  availability for ERCP tomorrow Will change to clears after MN

## 2022-08-31 NOTE — Assessment & Plan Note (Signed)
Chronic-stable.

## 2022-08-31 NOTE — ED Triage Notes (Signed)
Patient sent by PCP for abnormal CT results. CT showed abscess on pancrease. Patient reports losing weight rapidly and jaundice. Denies abdominal pain, nausea, and vomiting.

## 2022-08-31 NOTE — ED Provider Triage Note (Signed)
Emergency Medicine Provider Triage Evaluation Note  NOLTON MARCHESCHI , a 82 y.o. male  was evaluated in triage.  Pt complains of abnormal imaging.  Had an outpatient CT abdomen pelvis done which showed concern for tumor over the pancreas.  Complains of jaundice as well.  Review of Systems  Positive: As above Negative: As above  Physical Exam  BP (!) 149/76 (BP Location: Left Arm)   Pulse (!) 53   Temp 98.8 F (37.1 C) (Oral)   Resp 15   Ht 5\' 11"  (1.803 m)   Wt 63.6 kg   SpO2 100%   BMI 19.55 kg/m  Gen:   Awake, no distress   Resp:  Normal effort  MSK:   Moves extremities without difficulty  Other:    Medical Decision Making  Medically screening exam initiated at 6:13 PM.  Appropriate orders placed.  [name] was informed that the remainder of the evaluation will be completed by another provider, this initial triage assessment does not replace that evaluation, and the importance of remaining in the ED until their evaluation is complete.     Marita Kansas, PA-C 08/31/22 [phone]

## 2022-08-31 NOTE — Assessment & Plan Note (Signed)
-   Hold aspirin and statin for now

## 2022-08-31 NOTE — Addendum Note (Signed)
Addended by: Josph Macho on: 08/31/2022 05:35 AM   Modules accepted: Orders

## 2022-08-31 NOTE — Subjective & Objective (Signed)
Patient with significant weight loss and recent diagnosed pancreatic mass that now appears to be obstructing His urine started to be dark he presented to oncology yesterday was noted to have bilirubin up to 12.5 and elevated LFTs patient was recommended to go to ER but he declined at the time because he has to take care of his wife He had recent respiratory illness and was given some steroids Denies nausea vomiting or pruritus no bleeding CT scan was done showed a 3 cm pancreatic head mass concerning for pancreatic adenocarcinoma with prominent intrahepatic and extrahepatic biliary dilatation. Dr. Myna Hidalgo discussed case with [facility] plan was for patient to be admitted to have ERCP done

## 2022-09-01 ENCOUNTER — Inpatient Hospital Stay (HOSPITAL_COMMUNITY): Payer: Medicare HMO

## 2022-09-01 DIAGNOSIS — R7989 Other specified abnormal findings of blood chemistry: Secondary | ICD-10-CM

## 2022-09-01 DIAGNOSIS — R634 Abnormal weight loss: Secondary | ICD-10-CM

## 2022-09-01 DIAGNOSIS — K8689 Other specified diseases of pancreas: Secondary | ICD-10-CM | POA: Diagnosis not present

## 2022-09-01 DIAGNOSIS — K831 Obstruction of bile duct: Secondary | ICD-10-CM | POA: Diagnosis not present

## 2022-09-01 DIAGNOSIS — C250 Malignant neoplasm of head of pancreas: Secondary | ICD-10-CM

## 2022-09-01 LAB — COMPREHENSIVE METABOLIC PANEL
ALT: 334 U/L — ABNORMAL HIGH (ref 0–44)
AST: 175 U/L — ABNORMAL HIGH (ref 15–41)
Albumin: 3.1 g/dL — ABNORMAL LOW (ref 3.5–5.0)
Alkaline Phosphatase: 395 U/L — ABNORMAL HIGH (ref 38–126)
Anion gap: 8 (ref 5–15)
BUN: 14 mg/dL (ref 8–23)
CO2: 24 mmol/L (ref 22–32)
Calcium: 9.2 mg/dL (ref 8.9–10.3)
Chloride: 104 mmol/L (ref 98–111)
Creatinine, Ser: 1.04 mg/dL (ref 0.61–1.24)
GFR, Estimated: >60 mL/min (ref >60)
Glucose, Bld: 92 mg/dL (ref 70–99)
Potassium: 3.6 mmol/L (ref 3.5–5.1)
Sodium: 136 mmol/L (ref 135–145)
Total Bilirubin: 13.4 mg/dL — ABNORMAL HIGH (ref 0.3–1.2)
Total Protein: 6 g/dL — ABNORMAL LOW (ref 6.5–8.1)

## 2022-09-01 LAB — MAGNESIUM
Magnesium: 1.8 mg/dL (ref 1.7–2.4)
Magnesium: 1.8 mg/dL (ref 1.7–2.4)

## 2022-09-01 LAB — CBC
HCT: 35.6 % — ABNORMAL LOW (ref 39.0–52.0)
Hemoglobin: 12.8 g/dL — ABNORMAL LOW (ref 13.0–17.0)
MCH: 33.4 pg (ref 26.0–34.0)
MCHC: 36 g/dL (ref 30.0–36.0)
MCV: 93 fL (ref 80.0–100.0)
Platelets: 159 10*3/uL (ref 150–400)
RBC: 3.83 MIL/uL — ABNORMAL LOW (ref 4.22–5.81)
RDW: 15.9 % — ABNORMAL HIGH (ref 11.5–15.5)
WBC: 8.2 10*3/uL (ref 4.0–10.5)
nRBC: 0 % (ref 0.0–0.2)

## 2022-09-01 LAB — PHOSPHORUS
Phosphorus: 3.3 mg/dL (ref 2.5–4.6)
Phosphorus: 3.4 mg/dL (ref 2.5–4.6)

## 2022-09-01 LAB — PROTIME-INR
INR: 1.2 (ref 0.8–1.2)
Prothrombin Time: 15.1 seconds (ref 11.4–15.2)

## 2022-09-01 LAB — GLUCOSE, CAPILLARY
Glucose-Capillary: 130 mg/dL — ABNORMAL HIGH (ref 70–99)
Glucose-Capillary: 265 mg/dL — ABNORMAL HIGH (ref 70–99)
Glucose-Capillary: 275 mg/dL — ABNORMAL HIGH (ref 70–99)
Glucose-Capillary: 87 mg/dL (ref 70–99)

## 2022-09-01 LAB — CK: Total CK: 80 U/L (ref 49–397)

## 2022-09-01 LAB — TSH: TSH: 0.888 u[IU]/mL (ref 0.350–4.500)

## 2022-09-01 LAB — HEMOGLOBIN A1C
Hgb A1c MFr Bld: 7.5 % — ABNORMAL HIGH (ref 4.8–5.6)
Mean Plasma Glucose: 168.55 mg/dL

## 2022-09-01 LAB — PREALBUMIN: Prealbumin: 20 mg/dL (ref 18–38)

## 2022-09-01 MED ORDER — ACETAMINOPHEN 325 MG PO TABS
650.0000 mg | ORAL_TABLET | Freq: Four times a day (QID) | ORAL | Status: DC | PRN
  Administered 2022-09-01: 650 mg via ORAL
  Filled 2022-09-01: qty 2

## 2022-09-01 MED ORDER — ACETAMINOPHEN 650 MG RE SUPP: 650.0000 mg | Freq: Four times a day (QID) | RECTAL | Status: DC | PRN

## 2022-09-01 MED ORDER — GADOBUTROL 1 MMOL/ML IV SOLN
6.0000 mL | Freq: Once | INTRAVENOUS | Status: AC | PRN
  Administered 2022-09-01: 6 mL via INTRAVENOUS

## 2022-09-01 MED ORDER — PANTOPRAZOLE SODIUM 40 MG PO TBEC
40.0000 mg | DELAYED_RELEASE_TABLET | Freq: Every day | ORAL | Status: DC
  Administered 2022-09-01 – 2022-09-03 (×3): 40 mg via ORAL
  Filled 2022-09-01 (×3): qty 1

## 2022-09-01 MED ORDER — ONDANSETRON HCL 4 MG/2ML IJ SOLN: 4.0000 mg | Freq: Four times a day (QID) | INTRAMUSCULAR | Status: DC | PRN

## 2022-09-01 MED ORDER — ONDANSETRON HCL 4 MG PO TABS: 4.0000 mg | ORAL_TABLET | Freq: Four times a day (QID) | ORAL | Status: DC | PRN

## 2022-09-01 MED ORDER — FENTANYL CITRATE PF 50 MCG/ML IJ SOSY: 12.5000 ug | PREFILLED_SYRINGE | INTRAMUSCULAR | Status: DC | PRN

## 2022-09-01 MED ORDER — SODIUM CHLORIDE 0.9 % IV SOLN: INTRAVENOUS | Status: AC

## 2022-09-01 MED ORDER — AMLODIPINE BESYLATE 10 MG PO TABS
10.0000 mg | ORAL_TABLET | Freq: Every day | ORAL | Status: DC
  Administered 2022-09-01 – 2022-09-02 (×2): 10 mg via ORAL
  Filled 2022-09-01 (×2): qty 1

## 2022-09-01 NOTE — Hospital Course (Signed)
Alexander [name] is a 82 y.o. male with a history of diabetes mellitus type 2, polycythemia vera, hypertension, CAD, GERD, GI bleed, gout, hemorrhoids, chronic back pain.  Patient presented secondary to abnormal imaging with CT abdomen/pelvis significant for a pancreatic head mass which is concerning for pancreatic adenocarcinoma; imaging also concerning for evidence of biliary obstruction. Labs suggest the same.  GI consulted for ERCP.

## 2022-09-01 NOTE — Progress Notes (Signed)
   09/01/22 1017  TOC Brief Assessment  Insurance and Status Reviewed  Patient has primary care physician Yes  Home environment has been reviewed Resides with spouse  Prior level of function: Indpendent at baseline  Prior/Current Home Services No current home services  Social Determinants of Health Reivew SDOH reviewed no interventions necessary  Readmission risk has been reviewed Yes  Transition of care needs no transition of care needs at this time

## 2022-09-01 NOTE — Progress Notes (Signed)
PROGRESS NOTE    Alexander [name]  GNF:[id number] DOB: [birth year] DOA: 08/31/2022 PCP: Shirline Frees, NP   Brief Narrative: Alexander [name] is a 82 y.o. male with a history of diabetes mellitus type 2, polycythemia vera, hypertension, CAD, GERD, GI bleed, gout, hemorrhoids, chronic back pain.  Patient presented secondary to abnormal imaging with CT abdomen/pelvis significant for a pancreatic head mass which is concerning for pancreatic adenocarcinoma; imaging also concerning for evidence of biliary obstruction. Labs suggest the same.  GI consulted for ERCP.   Assessment and Plan:  Biliary obstruction Painless jaundice CT scan significant for evidence of a 3 cm pancreatic head mass concerning for pancreatic adenocarcinoma with associated severe dorsal pancreatic duct dilation and prominent intrahepatic/extrahepatic biliary dilation down to distal common bile duct. GI consulted for ERCP and possible stent placement.  Pancreatitic mass New diagnosis. Concerning for pancreatic adenocarcinoma. GI consulted and will perform ERCP.  Polycythemia vera Patient follows with Hematology as an outpatient.  Hyperlipidemia Pravastatin held secondary to elevated LFTs  Diabetes mellitus type 2 Well-controlled for age. Patient is on glipizide as an outpatient. -SSI  Primary hypertension Patient is on amlodipine and lisinopril.  CAD Patient is on aspirin and pravastatin as an outpatient, which were held.  DVT prophylaxis: SCDs Code Status:   Code Status: Full Code Family Communication: Wife at bedside Disposition Plan: Discharge likely in 48 hours after GI management/possible stent placement   Consultants:  [facility] Medical oncology  Procedures:  None  Antimicrobials: None    Subjective: Patient reports no concerns at all at this time. No pain.  Objective: BP 137/78 (BP Location: Right Arm)   Pulse (!) 58   Temp 98.4 F (36.9 C) (Oral)   Resp 17    Ht 5\' 11"  (1.803 m)   Wt 63.6 kg   SpO2 100%   BMI 19.55 kg/m   Examination:  General exam: Appears calm and comfortable Respiratory system: Clear to auscultation. Respiratory effort normal. Cardiovascular system: S1 & S2 heard, RRR. No murmurs, rubs, gallops or clicks. Gastrointestinal system: Abdomen is soft and nontender. Normal bowel sounds heard. Central nervous system: Alert and oriented. No focal neurological deficits. Musculoskeletal: No edema. No calf tenderness Skin: No cyanosis. Jaundice. Psychiatry: Judgement and insight appear normal. Mood & affect appropriate.    Data Reviewed: I have personally reviewed following labs and imaging studies  CBC Lab Results  Component Value Date   WBC 8.2 09/01/2022   RBC 3.83 (L) 09/01/2022   HGB 12.8 (L) 09/01/2022   HCT 35.6 (L) 09/01/2022   MCV 93.0 09/01/2022   MCH 33.4 09/01/2022   PLT 159 09/01/2022   MCHC 36.0 09/01/2022   RDW 15.9 (H) 09/01/2022   LYMPHSABS 1.3 08/30/2022   MONOABS 1.0 08/30/2022   EOSABS 0.1 08/30/2022   BASOSABS 0.1 08/30/2022     Last metabolic panel Lab Results  Component Value Date   NA 136 09/01/2022   K 3.6 09/01/2022   CL 104 09/01/2022   CO2 24 09/01/2022   BUN 14 09/01/2022   CREATININE 1.04 09/01/2022   GLUCOSE 92 09/01/2022   GFRNONAA >60 09/01/2022   GFRAA >60 07/25/2019   CALCIUM 9.2 09/01/2022   PHOS 3.4 09/01/2022   PROT 6.0 (L) 09/01/2022   ALBUMIN 3.1 (L) 09/01/2022   LABGLOB 2.9 03/29/2016   AGRATIO 1.4 03/29/2016   BILITOT 13.4 (H) 09/01/2022   ALKPHOS 395 (H) 09/01/2022   AST 175 (H) 09/01/2022   ALT 334 (H) 09/01/2022   ANIONGAP  8 09/01/2022    GFR: Estimated Creatinine Clearance: 49.3 mL/min (by C-G formula based on SCr of 1.04 mg/dL).  No results found for this or any previous visit (from the past 240 hour(s)).    Radiology Studies: MR ABDOMEN MRCP W WO CONTAST  Result Date: 09/01/2022 CLINICAL DATA:  Evaluate pancreatic mass identified by CT EXAM:  MRI ABDOMEN WITHOUT AND WITH CONTRAST (INCLUDING MRCP) TECHNIQUE: Multiplanar multisequence MR imaging of the abdomen was performed both before and after the administration of intravenous contrast. Heavily T2-weighted images of the biliary and pancreatic ducts were obtained, and three-dimensional MRCP images were rendered by post processing. CONTRAST:  6mL GADAVIST GADOBUTROL 1 MMOL/ML IV SOLN COMPARISON:  CT abdomen pelvis, 08/31/2022 FINDINGS: Lower chest: No acute abnormality. Hepatobiliary: No solid liver abnormality is seen. Simple benign cyst of the posterior liver dome, requiring no specific further follow-up or characterization. No gallstones or gallbladder wall thickening. Mild gallbladder distention and adenomyomatosis of the fundus. Severe intrahepatic biliary ductal dilatation the common bile duct measuring up to 1.7 cm in caliber and abruptly truncated within the central pancreatic head (series 4, image 15). Pancreas: Small, ill-defined, subtly diffusion restricting and hypoenhancing mass in the inferior pancreatic head and uncinate, difficult to precisely delineate although measuring approximately 2.5 x 2.0 x 1.9 cm (series 21, image 30, series 15, image 54). Abrupt truncation of the central pancreatic duct with severe ductal dilatation along the length of the remaining pancreas, up to 1.5 cm in caliber, and severe atrophy of the distal pancreatic parenchyma. Spleen: Normal in size without significant abnormality. Adrenals/Urinary Tract: Adrenal glands are unremarkable. Benign hemorrhagic or proteinaceous cyst of the posterior midportion of the left kidney, requiring no further follow-up or characterization (series 5, image 16). Kidneys are otherwise normal, without obvious renal calculi, solid lesion, or hydronephrosis. Stomach/Bowel: Stomach is within normal limits. No evidence of bowel wall thickening, distention, or inflammatory changes. Large burden of stool in the colon. Vascular/Lymphatic:  Aortic atherosclerosis. Mass most likely makes minimal contact to the posterior superior mesenteric vein near the confluence of the portal vein (e.g. Series 17, image 49), but appears to have preserved fat planes to the adjacent superior mesenteric artery and the portal vein proper. No enlarged abdominal lymph nodes. Other: No abdominal wall hernia or abnormality. No ascites. Musculoskeletal: No acute or significant osseous findings. IMPRESSION: 1. Small, ill-defined, subtly diffusion restricting and hypoenhancing mass in the inferior pancreatic head and uncinate, difficult to precisely delineate although measuring approximately 2.5 x 2.0 x 1.9 cm. Abrupt truncation of the central pancreatic duct with severe pancreatic ductal dilatation and atrophy of the pancreas distally. 2. Severe intra and extrahepatic biliary ductal dilatation, the common bile duct abruptly truncated within the central pancreatic head. 3. Findings consistent with pancreatic adenocarcinoma. 4. For purposes of local staging, mass most likely makes minimal contact to the posterior superior mesenteric vein near the confluence of the portal vein but appears to have preserved fat planes to the adjacent superior mesenteric artery and the portal vein proper. 5. No evidence of lymphadenopathy or organ metastatic disease in the abdomen. 6. Large burden of stool in the colon. Aortic Atherosclerosis (ICD10-I70.0). Electronically Signed   By: Jearld Lesch M.D.   On: 09/01/2022 09:13   MR 3D Recon At Scanner  Result Date: 09/01/2022 CLINICAL DATA:  Evaluate pancreatic mass identified by CT EXAM: MRI ABDOMEN WITHOUT AND WITH CONTRAST (INCLUDING MRCP) TECHNIQUE: Multiplanar multisequence MR imaging of the abdomen was performed both before and after the administration of intravenous  contrast. Heavily T2-weighted images of the biliary and pancreatic ducts were obtained, and three-dimensional MRCP images were rendered by post processing. CONTRAST:  6mL  GADAVIST GADOBUTROL 1 MMOL/ML IV SOLN COMPARISON:  CT abdomen pelvis, 08/31/2022 FINDINGS: Lower chest: No acute abnormality. Hepatobiliary: No solid liver abnormality is seen. Simple benign cyst of the posterior liver dome, requiring no specific further follow-up or characterization. No gallstones or gallbladder wall thickening. Mild gallbladder distention and adenomyomatosis of the fundus. Severe intrahepatic biliary ductal dilatation the common bile duct measuring up to 1.7 cm in caliber and abruptly truncated within the central pancreatic head (series 4, image 15). Pancreas: Small, ill-defined, subtly diffusion restricting and hypoenhancing mass in the inferior pancreatic head and uncinate, difficult to precisely delineate although measuring approximately 2.5 x 2.0 x 1.9 cm (series 21, image 30, series 15, image 54). Abrupt truncation of the central pancreatic duct with severe ductal dilatation along the length of the remaining pancreas, up to 1.5 cm in caliber, and severe atrophy of the distal pancreatic parenchyma. Spleen: Normal in size without significant abnormality. Adrenals/Urinary Tract: Adrenal glands are unremarkable. Benign hemorrhagic or proteinaceous cyst of the posterior midportion of the left kidney, requiring no further follow-up or characterization (series 5, image 16). Kidneys are otherwise normal, without obvious renal calculi, solid lesion, or hydronephrosis. Stomach/Bowel: Stomach is within normal limits. No evidence of bowel wall thickening, distention, or inflammatory changes. Large burden of stool in the colon. Vascular/Lymphatic: Aortic atherosclerosis. Mass most likely makes minimal contact to the posterior superior mesenteric vein near the confluence of the portal vein (e.g. Series 17, image 49), but appears to have preserved fat planes to the adjacent superior mesenteric artery and the portal vein proper. No enlarged abdominal lymph nodes. Other: No abdominal wall hernia or  abnormality. No ascites. Musculoskeletal: No acute or significant osseous findings. IMPRESSION: 1. Small, ill-defined, subtly diffusion restricting and hypoenhancing mass in the inferior pancreatic head and uncinate, difficult to precisely delineate although measuring approximately 2.5 x 2.0 x 1.9 cm. Abrupt truncation of the central pancreatic duct with severe pancreatic ductal dilatation and atrophy of the pancreas distally. 2. Severe intra and extrahepatic biliary ductal dilatation, the common bile duct abruptly truncated within the central pancreatic head. 3. Findings consistent with pancreatic adenocarcinoma. 4. For purposes of local staging, mass most likely makes minimal contact to the posterior superior mesenteric vein near the confluence of the portal vein but appears to have preserved fat planes to the adjacent superior mesenteric artery and the portal vein proper. 5. No evidence of lymphadenopathy or organ metastatic disease in the abdomen. 6. Large burden of stool in the colon. Aortic Atherosclerosis (ICD10-I70.0). Electronically Signed   By: Jearld Lesch M.D.   On: 09/01/2022 09:13   DG CHEST PORT 1 VIEW  Result Date: 08/31/2022 CLINICAL DATA:  10031 Cough 10031 EXAM: PORTABLE CHEST 1 VIEW.  Patient is rotated. COMPARISON:  Chest x-ray 08/22/2022 FINDINGS: The heart and mediastinal contours are unchanged. Aortic calcification. No focal consolidation. No pulmonary edema. No pleural effusion. No pneumothorax. No acute osseous abnormality. IMPRESSION: No active disease. Electronically Signed   By: Tish Frederickson M.D.   On: 08/31/2022 22:38   CT ABDOMEN PELVIS W CONTRAST  Result Date: 08/31/2022 CLINICAL DATA:  Unintentional weight loss of about 30 pounds in 3-4 months. Painless jaundice. EXAM: CT ABDOMEN AND PELVIS WITH CONTRAST TECHNIQUE: Multidetector CT imaging of the abdomen and pelvis was performed using the standard protocol following bolus administration of intravenous contrast. RADIATION  DOSE REDUCTION: This  exam was performed according to the departmental dose-optimization program which includes automated exposure control, adjustment of the mA and/or kV according to patient size and/or use of iterative reconstruction technique. CONTRAST:  OMNIPAQUE IOHEXOL 300 MG/ML  SOLN COMPARISON:  Overlapping portions CT chest 06/05/2013 FINDINGS: Lower chest: Atheromatous vascular calcification of the right coronary artery and descending thoracic aorta. Hepatobiliary: Prominent intrahepatic and extrahepatic biliary dilatation extending down to the distal common bile duct in the vicinity of a pancreatic head mass. No definite focal metastatic lesions to the liver. Posterior right hepatic lobe cyst 1.5 by 1.0 cm on image 16 of series 301, no follow up of this lesion is indicated. Mildly distended gallbladder. Pancreas: Severe dorsal pancreatic duct dilatation extending to a pancreatic head mass strongly favoring adenocarcinoma. Size: 3.0 by 1.8 by 2.6 cm Location: Pancreatic head Characterization: Solid Enhancement: Hypoenhancing on portal venous phase images. Other Characteristics: Severe dorsal pancreatic duct dilatation Local extent of mass: No invasion of the duodenum. Vascular Involvement: The mass minimally abuts the posterior margin of the SMV without invasion. No involvement of the celiac trunk, common hepatic artery, gastroduodenal artery, or proper hepatic artery. Variant hepatic artery anatomy: Conventional Bile Duct Involvement: Marked narrowing of the distal CBD in the vicinity of the ampulla suggesting potential ampullary involvement by tumor. Variant biliary anatomy: No Adjacent Nodes: Absent Omental/Peritoneal Disease: Absent Distant Metastases: Absent Spleen: Unremarkable Adrenals/Urinary Tract: Fullness of the adrenal glands without mass. 3.2 by 1.8 cm homogeneous hyperdense lesion of the left kidney posteriorly on image 27 series 301, internal density 50 Hounsfield units, likely a  complex cyst given the homogeneity, but technically nonspecific. This and the pancreatic lesion could be further characterized with dedicated pancreatic protocol MRI with and without contrast. Stomach/Bowel: Sigmoid colon diverticulosis. Lower rectal and anal wall thickening circumferentially, likely from nondistention but technically nonspecific, correlate with colon cancer screening history. Vascular/Lymphatic: Atherosclerosis is present, including aortoiliac atherosclerotic disease. Substantial gastric collateral vessels. Dorsal atheromatous plaque at the origin of the celiac trunk and SMA without occlusion. There is also plaque further distally in the superior mesenteric artery. The IMA appears patent. No pathologic adenopathy observed. Reproductive: Unremarkable Other: No supplemental non-categorized findings. Musculoskeletal: Fatty right spermatic cord. Bilateral mild foraminal impingement at L5-S1 due to intervertebral and facet spurring. IMPRESSION: 1. 3 cm pancreatic head mass strongly favoring pancreatic adenocarcinoma. There is severe dorsal pancreatic duct dilatation and prominent intrahepatic and extrahepatic biliary dilatation extending down to the distal common bile duct in the vicinity of the pancreatic head mass. Other characteristics detailed above. 2. 3.2 by 1.8 cm homogeneous hyperdense lesion of the left kidney posteriorly, internal density 50 Hounsfield units, likely a complex cyst given the homogeneity, but technically nonspecific. This and the pancreatic lesion could be further characterized with dedicated pancreatic protocol MRI with and without contrast. 3. Sigmoid colon diverticulosis. 4. Lower rectal and anal wall thickening circumferentially, likely from nondistention but technically nonspecific, correlate with colon cancer screening history. 5. Bilateral mild foraminal impingement at L5-S1 due to intervertebral and facet spurring. 6. Aortic atherosclerosis. Aortic Atherosclerosis  (ICD10-I70.0). Electronically Signed   By: Gaylyn Rong M.D.   On: 08/31/2022 15:09      LOS: 1 day    Jacquelin Hawking, MD [facility] 09/01/2022, 5:16 PM   If 7PM-7AM, please contact night-coverage [email or web address]

## 2022-09-01 NOTE — Plan of Care (Signed)
  Problem: Skin Integrity: Goal: Risk for impaired skin integrity will decrease Outcome: Progressing   Problem: Activity: Goal: Risk for activity intolerance will decrease Outcome: Progressing   Problem: Nutrition: Goal: Adequate nutrition will be maintained Outcome: Progressing   Problem: Elimination: Goal: Will not experience complications related to urinary retention Outcome: Progressing   Problem: Pain Managment: Goal: General experience of comfort will improve Outcome: Progressing

## 2022-09-01 NOTE — Consult Note (Signed)
Alexander [name] is very well-known to me.  He is very nice 82 year old white male.  He has been in excellent shape.  He swims daily.  He probably can swim a mile a day.  I see him for polycythemia vera.  However, when we saw him for routine follow-up earlier this week, he was markedly jaundiced.  His bilirubin was 12.5.  His LFTs were elevated..  We got a CT scan on him yesterday.  This showed extensive biliary obstruction both intrahepatic and extrahepatic.  He had a pancreatic head mass measuring 3 x 2.6 cm.  He was subsequently admitted.  Hopefully, he will be able to have a biliary stent placed.  I do not know if Gastroenterology would be able to do it today or tomorrow.  He feels well.  He has lost some weight.  His appetite is decent.  He has had no diarrhea.  He has had no fever.  He has had no cough or shortness of breath.  He has had maybe little nausea but no vomiting.  Today, his bilirubin was 13.5.  While he is in the hospital, I would get a MRCP on him to see about relationships of the tumor with vascular structures.  At least on the CT scan, looks like this tumor was free of arterial vessels.  He has had no bleeding.  He has had no leg swelling.  Again, he has been in excellent shape.  Overall, I would say that his performance status is probably ECOG 1.   His vital signs show temperature of 99.1.  Pulse 42.  Blood pressure 159/72.  His weight is 140 pounds.  His head exam does show the scleral icterus.  He does have palatal icterus.  There is no adenopathy in the neck.  Lungs are clear bilaterally.  Cardiac exam regular rate and rhythm.  Abdomen is soft.  There is no abdominal mass.  There is no fluid wave.  There is no palpable hepatosplenomegaly.  Extremities shows no clubbing, cyanosis or edema.  Neurological exam is nonfocal.   Alexander [name] is an 82 year old white male.  He has had polycythemia.  This was never been a problem for him.  Now, looks like he has a pancreatic mass.  I  have to believe that this is pancreatic cancer.  Again, we will need care gastroenterology to see him to see about placing a biliary stent.  We need to have Surgical Oncology see him to see if he would be a candidate for upfront surgery or if he needs neoadjuvant chemotherapy.  We will send off a CA 19-9.  I suspect this probably can be high due to the biliary obstruction.  Again, he is in great shape.  We can certainly be aggressive.  If, for some reason, the biliary stent cannot be placed this weekend, I probably would let him go home and then have him come back as a outpatient to have the procedure done if possible.  Again, an MRCP will certainly be helpful I think.  At least by the CT scan, I would think that this is resectable disease.  We will follow along and help out any way that we can.   Christin Bach, MD  John 1:1  Again,

## 2022-09-01 NOTE — H&P (View-Only) (Signed)
Referring Provider: Dr. Myna Hidalgo Primary Care Physician:  Shirline Frees, NP Primary Gastroenterologist:  Dr. Russella Dar  Reason for Consultation:  Biliary obstruction, pancreatic mass  HPI: FLOID KETTERLING is a 82 y.o. male with medical history significant of DM2, polycythemia vera, HTN, CAD, GERD, gout, chronic back pain.  He is well-known to Dr. Myna Hidalgo as he follows with him for his polycythemia vera.  When he was seen earlier this week he was markedly jaundiced and bilirubin was 12.5, other LFTs elevated as well.  This is new compared to three months ago.  Dr. Myna Hidalgo ordered CT scan, which was performed yesterday and as follows.  CT scan abdomen and pelvis with contrast yesterday showed the following:  IMPRESSION: 1. 3 cm pancreatic head mass strongly favoring pancreatic adenocarcinoma. There is severe dorsal pancreatic duct dilatation and prominent intrahepatic and extrahepatic biliary dilatation extending down to the distal common bile duct in the vicinity of the pancreatic head mass. Other characteristics detailed above. 2. 3.2 by 1.8 cm homogeneous hyperdense lesion of the left kidney posteriorly, internal density 50 Hounsfield units, likely a complex cyst given the homogeneity, but technically nonspecific. This and the pancreatic lesion could be further characterized with dedicated pancreatic protocol MRI with and without contrast. 3. Sigmoid colon diverticulosis. 4. Lower rectal and anal wall thickening circumferentially, likely from nondistention but technically nonspecific, correlate with colon cancer screening history. 5. Bilateral mild foraminal impingement at L5-S1 due to intervertebral and facet spurring. 6. Aortic atherosclerosis.   Aortic Atherosclerosis (ICD10-I70.0).  He was admitted and seen by Dr. Myna Hidalgo this morning.  MRI abdomen/MRCP as follows:  IMPRESSION: 1. Small, ill-defined, subtly diffusion restricting and hypoenhancing mass in the inferior  pancreatic head and uncinate, difficult to precisely delineate although measuring approximately 2.5 x 2.0 x 1.9 cm. Abrupt truncation of the central pancreatic duct with severe pancreatic ductal dilatation and atrophy of the pancreas distally. 2. Severe intra and extrahepatic biliary ductal dilatation, the common bile duct abruptly truncated within the central pancreatic head. 3. Findings consistent with pancreatic adenocarcinoma. 4. For purposes of local staging, mass most likely makes minimal contact to the posterior superior mesenteric vein near the confluence of the portal vein but appears to have preserved fat planes to the adjacent superior mesenteric artery and the portal vein proper. 5. No evidence of lymphadenopathy or organ metastatic disease in the abdomen. 6. Large burden of stool in the colon.   Aortic Atherosclerosis (ICD10-I70.0).  CA 19-9 is pending.  LFTs this morning as follows: Total bili 13.4, alk phos 395, ALT 334, AST 175.  He tells me that he has lost a significant amount of weight over the past several months, according to his log it looks like 30 to 35 pounds.  Otherwise he has no complaints, feels well, no pain anywhere, is extremely active with swimming, etc.  He tells me that he has noticed his stools being very light in color recently and even had said something to his PCP, but patient and his wife did not notice the yellowing color of his skin and eyes.  He says that he saw his PCP the day prior to seeing Dr. Myna Hidalgo.  Past Medical History:  Diagnosis Date   Arthritis    "minor; shoulders primarily" (05/20/2013)   CAD (coronary artery disease)    a. 05/2013 - Chest pain/unstable angina and dynamic EKG changes showing cath showing atherosclerotic coronary artery disease manifested as diffuse ectasia, normal EF.   Diverticulosis of colon    GERD (gastroesophageal reflux  disease)    GI bleed    secondary to Aspirin   Gout    Hematuria, microscopic     Hemorrhoids    Hyperglycemia    Hyperlipidemia    Hypertension    Lumbar back pain    Microalbuminuria    Overweight(278.02)    Polycythemia rubra vera (HCC)    Tubular adenoma of colon 07/2012    Past Surgical History:  Procedure Laterality Date   CARDIAC CATHETERIZATION  05/20/2013   CATARACT EXTRACTION W/ INTRAOCULAR LENS  IMPLANT, BILATERAL Bilateral 2008   CYSTECTOMY  ~ 2000   " SEBACEOUS cyst removed from between shoulder blades"   EXCISIONAL HEMORRHOIDECTOMY  1970's   INGUINAL HERNIA REPAIR Right ~ 2010   LEFT HEART CATHETERIZATION WITH CORONARY ANGIOGRAM N/A 05/20/2013   Procedure: LEFT HEART CATHETERIZATION WITH CORONARY ANGIOGRAM;  Surgeon: Peter M Swaziland, MD;  Location: [facility] CATH LAB;  Service: Cardiovascular;  Laterality: N/A;   TONSILLECTOMY AND ADENOIDECTOMY  1940's   VASECTOMY      Prior to Admission medications   Medication Sig Start Date End Date Taking? Authorizing Provider  allopurinol (ZYLOPRIM) 100 MG tablet TAKE ONE TABLET BY MOUTH DAILY 03/03/22  Yes Nafziger, Kandee Keen, NP  amLODipine (NORVASC) 10 MG tablet TAKE ONE TABLET BY MOUTH DAILY 03/21/22  Yes Nafziger, Kandee Keen, NP  aspirin EC 81 MG tablet Take 1 tablet (81 mg total) by mouth daily. 02/06/14  Yes Jake Bathe, MD  Coenzyme Q10 300 MG CAPS Take 1 capsule by mouth daily.    Yes [provider]  doxycycline (VIBRA-TABS) 100 MG tablet Take 1 tablet (100 mg total) by mouth 2 (two) times daily for 7 days. 08/30/22 09/06/22 Yes Nafziger, Kandee Keen, NP  glipiZIDE (GLUCOTROL XL) 2.5 MG 24 hr tablet Take 1 tablet (2.5 mg total) by mouth daily with breakfast. 08/25/22  Yes Nafziger, Kandee Keen, NP  hydrocortisone (ANUSOL-HC) 25 MG suppository Place 25 mg rectally daily as needed for hemorrhoids or anal itching.   Yes [provider]  lisinopril (ZESTRIL) 10 MG tablet TAKE ONE TABLET BY MOUTH DAILY 03/21/22  Yes Nafziger, Kandee Keen, NP  pantoprazole (PROTONIX) 40 MG tablet Take 1 tablet (40 mg total) by mouth daily. 03/23/22  Yes  Nafziger, Kandee Keen, NP  pravastatin (PRAVACHOL) 40 MG tablet TAKE ONE TABLET BY MOUTH DAILY 03/03/22  Yes Nafziger, Kandee Keen, NP  PROCTO-MED HC 2.5 % rectal cream APPLY 1 CREAM RECTALLY TWICE DAILY 08/09/22  Yes Nafziger, Kandee Keen, NP  Accu-Chek Softclix Lancets lancets Use to test blood glucose once daily 11/09/21   Nafziger, Kandee Keen, NP  Blood Glucose Calibration (EMBRACE PRO GLUCOSE CONTROL) LIQD Use to check blood glucose TID 04/19/22   Nafziger, Kandee Keen, NP  Blood Glucose Monitoring Suppl (ACCU-CHEK AVIVA PLUS) w/Device KIT Use to test blood glucose once daily 12/11/17   Nafziger, Kandee Keen, NP  glucose blood (EMBRACE PRO GLUCOSE TEST) test strip Use as instructed 04/19/22   Shirline Frees, NP  PREVIDENT 5000 ENAMEL PROTECT 1.1-5 % PSTE See admin instructions. 02/15/18   [provider]    Current Facility-Administered Medications  Medication Dose Route Frequency Provider Last Rate Last Admin   0.9 %  sodium chloride infusion   Intravenous Continuous Therisa Doyne, MD 75 mL/hr at 09/01/22 0029 New Bag at 09/01/22 0029   acetaminophen (TYLENOL) tablet 650 mg  650 mg Oral Q6H PRN Therisa Doyne, MD       Or   acetaminophen (TYLENOL) suppository 650 mg  650 mg Rectal Q6H PRN Therisa Doyne, MD  amLODipine (NORVASC) tablet 10 mg  10 mg Oral Daily Doutova, Anastassia, MD       fentaNYL (SUBLIMAZE) injection 12.5-50 mcg  12.5-50 mcg Intravenous Q2H PRN Doutova, Anastassia, MD       insulin aspart (novoLOG) injection 0-9 Units  0-9 Units Subcutaneous Q4H Doutova, Anastassia, MD   1 Units at 09/01/22 0725   ondansetron (ZOFRAN) tablet 4 mg  4 mg Oral Q6H PRN Therisa Doyne, MD       Or   ondansetron (ZOFRAN) injection 4 mg  4 mg Intravenous Q6H PRN Doutova, Anastassia, MD       pantoprazole (PROTONIX) EC tablet 40 mg  40 mg Oral Daily Doutova, Jonny Ruiz, MD        Allergies as of 08/31/2022 - Review Complete 08/31/2022  Allergen Reaction Noted   Metformin and related Diarrhea 06/06/2012    Metformin hcl Diarrhea 10/07/2020    Family History  Problem Relation Age of Onset   Colon cancer Mother        died at 97, colorectal   Diabetes Mother    Heart disease Father 70       MI   Diabetes Father    Stomach cancer Maternal Grandfather    Cancer Maternal Uncle     Social History   Socioeconomic History   Marital status: Married    Spouse name: Not on file   Number of children: 3   Years of education: Not on file   Highest education level: Some college, no degree  Occupational History   Occupation: retired Garment/textile technologist: RETIRED  Tobacco Use   Smoking status: Never   Smokeless tobacco: Never   Tobacco comments:    NEVER USED TOBACCO  Vaping Use   Vaping status: Never Used  Substance and Sexual Activity   Alcohol use: No    Alcohol/week: 0.0 standard drinks of alcohol   Drug use: No   Sexual activity: Yes    Partners: Female  Other Topics Concern   Not on file  Social History Narrative   Marriedfor 52 years    Daughter, Physicist, medical (pediatrician--lives in [place], [place]), one in [place] - Warden/ranger. One in [place] - Chartered loss adjuster          Social Determinants of Health   Financial Resource Strain: Low Risk  (06/11/2022)   Overall Financial Resource Strain (CARDIA)    Difficulty of Paying Living Expenses: Not hard at all  Food Insecurity: No Food Insecurity (06/11/2022)   Hunger Vital Sign    Worried About Running Out of Food in the Last Year: Never true    Ran Out of Food in the Last Year: Never true  Transportation Needs: No Transportation Needs (06/11/2022)   PRAPARE - Administrator, Civil Service (Medical): No    Lack of Transportation (Non-Medical): No  Physical Activity: Sufficiently Active (06/11/2022)   Exercise Vital Sign    Days of Exercise per Week: 7 days    Minutes of Exercise per Session: 90 min  Stress: No Stress Concern Present (06/11/2022)   [employer] of Occupational Health - Occupational Stress Questionnaire    Feeling of  Stress : Not at all  Social Connections: Socially Integrated (06/11/2022)   Social Connection and Isolation Panel [NHANES]    Frequency of Communication with Friends and Family: More than three times a week    Frequency of Social Gatherings with Friends and Family: Twice a week    Attends Religious Services: More than 4 times per  year    Active Member of Clubs or Organizations: Yes    Attends Banker Meetings: More than 4 times per year    Marital Status: Married  Catering manager Violence: Not At Risk (09/09/2021)   Humiliation, Afraid, Rape, and Kick questionnaire    Fear of Current or Ex-Partner: No    Emotionally Abused: No    Physically Abused: No    Sexually Abused: No    Review of Systems: ROS is O/W negative except as mentioned in HPI.  Physical Exam: Vital signs in last 24 hours: Temp:  [97.6 F (36.4 C)-99.1 F (37.3 C)] 97.6 F (36.4 C) (08/30 0623) Pulse Rate:  [42-55] 42 (08/30 0534) Resp:  [11-18] 16 (08/30 0534) BP: (129-159)/(72-90) 159/72 (08/30 0534) SpO2:  [99 %-100 %] 99 % (08/30 0534) Weight:  [63.6 kg] 63.6 kg (08/29 1745) Last BM Date : 09/01/22 General:  Alert, Well-developed, well-nourished, pleasant and cooperative in NAD; deep jaundice noted Head:  Normocephalic and atraumatic. Eyes:  Scleral icterus noted. Ears:  Normal auditory acuity. Lungs:  Clear throughout to auscultation.  No wheezes, crackles, or rhonchi.  Heart:  Regular rate and rhythm; no murmurs, clicks, rubs, or gallops. Abdomen:  Soft, non-distended.  BS present.  Non-tender.   Msk:  Symmetrical without gross deformities.  Pulses:  Normal pulses noted. Extremities:  Without clubbing or edema. Neurologic:  Alert and oriented x 4;  grossly normal neurologically. Skin:  Intact without significant lesions or rashes. Psych:  Alert and cooperative. Normal mood and affect.  Intake/Output from previous day: 08/29 0701 - 08/30 0700 In: 113.6 [I.V.:113.6] Out: -   Lab  Results: Recent Labs    08/30/22 1500 08/31/22 1801 09/01/22 0444  WBC 8.0 9.1 8.2  HGB 13.5 13.4 12.8*  HCT 39.1 37.8* 35.6*  PLT 171 182 159   BMET Recent Labs    08/30/22 1500 08/31/22 1801 09/01/22 0444  NA 135 133* 136  K 4.8 4.4 3.6  CL 101 99 104  CO2 27 25 24   GLUCOSE 314* 274* 92  BUN 15 15 14   CREATININE 1.45* 1.04 1.04  CALCIUM 9.5 9.4 9.2   LFT Recent Labs    09/01/22 0444  PROT 6.0*  ALBUMIN 3.1*  AST 175*  ALT 334*  ALKPHOS 395*  BILITOT 13.4*   PT/INR Recent Labs    08/31/22 2330  LABPROT 15.1  INR 1.2   Studies/Results: DG CHEST PORT 1 VIEW  Result Date: 08/31/2022 CLINICAL DATA:  10031 Cough 10031 EXAM: PORTABLE CHEST 1 VIEW.  Patient is rotated. COMPARISON:  Chest x-ray 08/22/2022 FINDINGS: The heart and mediastinal contours are unchanged. Aortic calcification. No focal consolidation. No pulmonary edema. No pleural effusion. No pneumothorax. No acute osseous abnormality. IMPRESSION: No active disease. Electronically Signed   By: Tish Frederickson M.D.   On: 08/31/2022 22:38   CT ABDOMEN PELVIS W CONTRAST  Result Date: 08/31/2022 CLINICAL DATA:  Unintentional weight loss of about 30 pounds in 3-4 months. Painless jaundice. EXAM: CT ABDOMEN AND PELVIS WITH CONTRAST TECHNIQUE: Multidetector CT imaging of the abdomen and pelvis was performed using the standard protocol following bolus administration of intravenous contrast. RADIATION DOSE REDUCTION: This exam was performed according to the departmental dose-optimization program which includes automated exposure control, adjustment of the mA and/or kV according to patient size and/or use of iterative reconstruction technique. CONTRAST:  OMNIPAQUE IOHEXOL 300 MG/ML  SOLN COMPARISON:  Overlapping portions CT chest 06/05/2013 FINDINGS: Lower chest: Atheromatous vascular calcification of the right  coronary artery and descending thoracic aorta. Hepatobiliary: Prominent intrahepatic and extrahepatic  biliary dilatation extending down to the distal common bile duct in the vicinity of a pancreatic head mass. No definite focal metastatic lesions to the liver. Posterior right hepatic lobe cyst 1.5 by 1.0 cm on image 16 of series 301, no follow up of this lesion is indicated. Mildly distended gallbladder. Pancreas: Severe dorsal pancreatic duct dilatation extending to a pancreatic head mass strongly favoring adenocarcinoma. Size: 3.0 by 1.8 by 2.6 cm Location: Pancreatic head Characterization: Solid Enhancement: Hypoenhancing on portal venous phase images. Other Characteristics: Severe dorsal pancreatic duct dilatation Local extent of mass: No invasion of the duodenum. Vascular Involvement: The mass minimally abuts the posterior margin of the SMV without invasion. No involvement of the celiac trunk, common hepatic artery, gastroduodenal artery, or proper hepatic artery. Variant hepatic artery anatomy: Conventional Bile Duct Involvement: Marked narrowing of the distal CBD in the vicinity of the ampulla suggesting potential ampullary involvement by tumor. Variant biliary anatomy: No Adjacent Nodes: Absent Omental/Peritoneal Disease: Absent Distant Metastases: Absent Spleen: Unremarkable Adrenals/Urinary Tract: Fullness of the adrenal glands without mass. 3.2 by 1.8 cm homogeneous hyperdense lesion of the left kidney posteriorly on image 27 series 301, internal density 50 Hounsfield units, likely a complex cyst given the homogeneity, but technically nonspecific. This and the pancreatic lesion could be further characterized with dedicated pancreatic protocol MRI with and without contrast. Stomach/Bowel: Sigmoid colon diverticulosis. Lower rectal and anal wall thickening circumferentially, likely from nondistention but technically nonspecific, correlate with colon cancer screening history. Vascular/Lymphatic: Atherosclerosis is present, including aortoiliac atherosclerotic disease. Substantial gastric collateral vessels.  Dorsal atheromatous plaque at the origin of the celiac trunk and SMA without occlusion. There is also plaque further distally in the superior mesenteric artery. The IMA appears patent. No pathologic adenopathy observed. Reproductive: Unremarkable Other: No supplemental non-categorized findings. Musculoskeletal: Fatty right spermatic cord. Bilateral mild foraminal impingement at L5-S1 due to intervertebral and facet spurring. IMPRESSION: 1. 3 cm pancreatic head mass strongly favoring pancreatic adenocarcinoma. There is severe dorsal pancreatic duct dilatation and prominent intrahepatic and extrahepatic biliary dilatation extending down to the distal common bile duct in the vicinity of the pancreatic head mass. Other characteristics detailed above. 2. 3.2 by 1.8 cm homogeneous hyperdense lesion of the left kidney posteriorly, internal density 50 Hounsfield units, likely a complex cyst given the homogeneity, but technically nonspecific. This and the pancreatic lesion could be further characterized with dedicated pancreatic protocol MRI with and without contrast. 3. Sigmoid colon diverticulosis. 4. Lower rectal and anal wall thickening circumferentially, likely from nondistention but technically nonspecific, correlate with colon cancer screening history. 5. Bilateral mild foraminal impingement at L5-S1 due to intervertebral and facet spurring. 6. Aortic atherosclerosis. Aortic Atherosclerosis (ICD10-I70.0). Electronically Signed   By: Gaylyn Rong M.D.   On: 08/31/2022 15:09    IMPRESSION:  *82 year old male presenting with painless jaundice/biliary obstruction with a newly elevated total bilirubin/LFTs, bili up to 13.  CT scan yesterday showed a 3 cm pancreatic head mass strongly favoring pancreatic adenocarcinoma with severe pancreatic duct dilatation and prominent intra and extrahepatic biliary ductal dilatation extending down to the distal common bile duct.  MRI abdomen/MRCP showing small ill-defined  slightly diffusion restricting and hypoenhancing mass in the inferior pancreatic head and uncinate with abrupt truncation of the central pancreatic duct with severe pancreatic ductal dilatation and atrophy of the pancreas distally.  Has severe intra and extrahepatic ductal dilatation the common bile duct abruptly truncated with the central  pancreatic head consistent with pancreatic adenocarcinoma.    Reports 30-35 pound weight loss over the past months despite significant PO intake.    *CT scan also showing lower rectal and anal wall thickening circumferentially, possibly from nondistention, but they are saying technically nonspecific.  Last flex sig or colonoscopy was 2015.  MRI showing large colonic stool burden.  Patient reports having regular BMs, had one overnight.  PLAN: -Will plan for ERCP for decompression on 8/31 with Dr. Marina Goodell. -May need EUS with biopsy in the near future as well. -? Colonoscopy at some point as well to evaluate colon findings. -Trend LFTs/labs.  Follow-up CA 19-9.   Princella Pellegrini. Zehr  09/01/2022, 8:49 AM  GI ATTENDING  History, laboratories, x-rays reviewed.  Patient seen and examined.  Agree with comprehensive consultation note as outlined above.  Patient presents with painless jaundice.  Imaging is concerning for pancreatic cancer.  Plans tomorrow for ERCP with stent placement.The nature of the procedure, as well as the risks, benefits, and alternatives were carefully and thoroughly reviewed with the patient. Ample time for discussion and questions allowed. The patient understood, was satisfied, and agreed to proceed.  Wilhemina Bonito. Eda Keys., M.D. [facility] Division of Gastroenterology

## 2022-09-01 NOTE — Consult Note (Addendum)
Referring Provider: Dr. Myna Hidalgo Primary Care Physician:  Shirline Frees, NP Primary Gastroenterologist:  Dr. Russella Dar  Reason for Consultation:  Biliary obstruction, pancreatic mass  HPI: Alexander [name] is a 82 y.o. male with medical history significant of DM2, polycythemia vera, HTN, CAD, GERD, gout, chronic back pain.  He is well-known to Dr. Myna Hidalgo as he follows with him for his polycythemia vera.  When he was seen earlier this week he was markedly jaundiced and bilirubin was 12.5, other LFTs elevated as well.  This is new compared to three months ago.  Dr. Myna Hidalgo ordered CT scan, which was performed yesterday and as follows.  CT scan abdomen and pelvis with contrast yesterday showed the following:  IMPRESSION: 1. 3 cm pancreatic head mass strongly favoring pancreatic adenocarcinoma. There is severe dorsal pancreatic duct dilatation and prominent intrahepatic and extrahepatic biliary dilatation extending down to the distal common bile duct in the vicinity of the pancreatic head mass. Other characteristics detailed above. 2. 3.2 by 1.8 cm homogeneous hyperdense lesion of the left kidney posteriorly, internal density 50 Hounsfield units, likely a complex cyst given the homogeneity, but technically nonspecific. This and the pancreatic lesion could be further characterized with dedicated pancreatic protocol MRI with and without contrast. 3. Sigmoid colon diverticulosis. 4. Lower rectal and anal wall thickening circumferentially, likely from nondistention but technically nonspecific, correlate with colon cancer screening history. 5. Bilateral mild foraminal impingement at L5-S1 due to intervertebral and facet spurring. 6. Aortic atherosclerosis.   Aortic Atherosclerosis (ICD10-I70.0).  He was admitted and seen by Dr. Myna Hidalgo this morning.  MRI abdomen/MRCP as follows:  IMPRESSION: 1. Small, ill-defined, subtly diffusion restricting and hypoenhancing mass in the inferior  pancreatic head and uncinate, difficult to precisely delineate although measuring approximately 2.5 x 2.0 x 1.9 cm. Abrupt truncation of the central pancreatic duct with severe pancreatic ductal dilatation and atrophy of the pancreas distally. 2. Severe intra and extrahepatic biliary ductal dilatation, the common bile duct abruptly truncated within the central pancreatic head. 3. Findings consistent with pancreatic adenocarcinoma. 4. For purposes of local staging, mass most likely makes minimal contact to the posterior superior mesenteric vein near the confluence of the portal vein but appears to have preserved fat planes to the adjacent superior mesenteric artery and the portal vein proper. 5. No evidence of lymphadenopathy or organ metastatic disease in the abdomen. 6. Large burden of stool in the colon.   Aortic Atherosclerosis (ICD10-I70.0).  CA 19-9 is pending.  LFTs this morning as follows: Total bili 13.4, alk phos 395, ALT 334, AST 175.  He tells me that he has lost a significant amount of weight over the past several months, according to his log it looks like 30 to 35 pounds.  Otherwise he has no complaints, feels well, no pain anywhere, is extremely active with swimming, etc.  He tells me that he has noticed his stools being very light in color recently and even had said something to his PCP, but patient and his wife did not notice the yellowing color of his skin and eyes.  He says that he saw his PCP the day prior to seeing Dr. Myna Hidalgo.  Past Medical History:  Diagnosis Date   Arthritis    "minor; shoulders primarily" (05/20/2013)   CAD (coronary artery disease)    a. 05/2013 - Chest pain/unstable angina and dynamic EKG changes showing cath showing atherosclerotic coronary artery disease manifested as diffuse ectasia, normal EF.   Diverticulosis of colon    GERD (gastroesophageal reflux  disease)    GI bleed    secondary to Aspirin   Gout    Hematuria, microscopic     Hemorrhoids    Hyperglycemia    Hyperlipidemia    Hypertension    Lumbar back pain    Microalbuminuria    Overweight(278.02)    Polycythemia rubra vera (HCC)    Tubular adenoma of colon 07/2012    Past Surgical History:  Procedure Laterality Date   CARDIAC CATHETERIZATION  05/20/2013   CATARACT EXTRACTION W/ INTRAOCULAR LENS  IMPLANT, BILATERAL Bilateral 2008   CYSTECTOMY  ~ 2000   " SEBACEOUS cyst removed from between shoulder blades"   EXCISIONAL HEMORRHOIDECTOMY  1970's   INGUINAL HERNIA REPAIR Right ~ 2010   LEFT HEART CATHETERIZATION WITH CORONARY ANGIOGRAM N/A 05/20/2013   Procedure: LEFT HEART CATHETERIZATION WITH CORONARY ANGIOGRAM;  Surgeon: Peter M Swaziland, MD;  Location: [facility] CATH LAB;  Service: Cardiovascular;  Laterality: N/A;   TONSILLECTOMY AND ADENOIDECTOMY  1940's   VASECTOMY      Prior to Admission medications   Medication Sig Start Date End Date Taking? Authorizing Provider  allopurinol (ZYLOPRIM) 100 MG tablet TAKE ONE TABLET BY MOUTH DAILY 03/03/22  Yes Nafziger, Kandee Keen, NP  amLODipine (NORVASC) 10 MG tablet TAKE ONE TABLET BY MOUTH DAILY 03/21/22  Yes Nafziger, Kandee Keen, NP  aspirin EC 81 MG tablet Take 1 tablet (81 mg total) by mouth daily. 02/06/14  Yes Jake Bathe, MD  Coenzyme Q10 300 MG CAPS Take 1 capsule by mouth daily.    Yes [provider]  doxycycline (VIBRA-TABS) 100 MG tablet Take 1 tablet (100 mg total) by mouth 2 (two) times daily for 7 days. 08/30/22 09/06/22 Yes Nafziger, Kandee Keen, NP  glipiZIDE (GLUCOTROL XL) 2.5 MG 24 hr tablet Take 1 tablet (2.5 mg total) by mouth daily with breakfast. 08/25/22  Yes Nafziger, Kandee Keen, NP  hydrocortisone (ANUSOL-HC) 25 MG suppository Place 25 mg rectally daily as needed for hemorrhoids or anal itching.   Yes [provider]  lisinopril (ZESTRIL) 10 MG tablet TAKE ONE TABLET BY MOUTH DAILY 03/21/22  Yes Nafziger, Kandee Keen, NP  pantoprazole (PROTONIX) 40 MG tablet Take 1 tablet (40 mg total) by mouth daily. 03/23/22  Yes  Nafziger, Kandee Keen, NP  pravastatin (PRAVACHOL) 40 MG tablet TAKE ONE TABLET BY MOUTH DAILY 03/03/22  Yes Nafziger, Kandee Keen, NP  PROCTO-MED HC 2.5 % rectal cream APPLY 1 CREAM RECTALLY TWICE DAILY 08/09/22  Yes Nafziger, Kandee Keen, NP  Accu-Chek Softclix Lancets lancets Use to test blood glucose once daily 11/09/21   Nafziger, Kandee Keen, NP  Blood Glucose Calibration (EMBRACE PRO GLUCOSE CONTROL) LIQD Use to check blood glucose TID 04/19/22   Nafziger, Kandee Keen, NP  Blood Glucose Monitoring Suppl (ACCU-CHEK AVIVA PLUS) w/Device KIT Use to test blood glucose once daily 12/11/17   Nafziger, Kandee Keen, NP  glucose blood (EMBRACE PRO GLUCOSE TEST) test strip Use as instructed 04/19/22   Shirline Frees, NP  PREVIDENT 5000 ENAMEL PROTECT 1.1-5 % PSTE See admin instructions. 02/15/18   [provider]    Current Facility-Administered Medications  Medication Dose Route Frequency Provider Last Rate Last Admin   0.9 %  sodium chloride infusion   Intravenous Continuous Therisa Doyne, MD 75 mL/hr at 09/01/22 0029 New Bag at 09/01/22 0029   acetaminophen (TYLENOL) tablet 650 mg  650 mg Oral Q6H PRN Therisa Doyne, MD       Or   acetaminophen (TYLENOL) suppository 650 mg  650 mg Rectal Q6H PRN Therisa Doyne, MD  amLODipine (NORVASC) tablet 10 mg  10 mg Oral Daily Doutova, Anastassia, MD       fentaNYL (SUBLIMAZE) injection 12.5-50 mcg  12.5-50 mcg Intravenous Q2H PRN Doutova, Anastassia, MD       insulin aspart (novoLOG) injection 0-9 Units  0-9 Units Subcutaneous Q4H Doutova, Anastassia, MD   1 Units at 09/01/22 0725   ondansetron (ZOFRAN) tablet 4 mg  4 mg Oral Q6H PRN Therisa Doyne, MD       Or   ondansetron (ZOFRAN) injection 4 mg  4 mg Intravenous Q6H PRN Doutova, Anastassia, MD       pantoprazole (PROTONIX) EC tablet 40 mg  40 mg Oral Daily Doutova, Jonny Ruiz, MD        Allergies as of 08/31/2022 - Review Complete 08/31/2022  Allergen Reaction Noted   Metformin and related Diarrhea 06/06/2012    Metformin hcl Diarrhea 10/07/2020    Family History  Problem Relation Age of Onset   Colon cancer Mother        died at 97, colorectal   Diabetes Mother    Heart disease Father 70       MI   Diabetes Father    Stomach cancer Maternal Grandfather    Cancer Maternal Uncle     Social History   Socioeconomic History   Marital status: Married    Spouse name: Not on file   Number of children: 3   Years of education: Not on file   Highest education level: Some college, no degree  Occupational History   Occupation: retired Garment/textile technologist: RETIRED  Tobacco Use   Smoking status: Never   Smokeless tobacco: Never   Tobacco comments:    NEVER USED TOBACCO  Vaping Use   Vaping status: Never Used  Substance and Sexual Activity   Alcohol use: No    Alcohol/week: 0.0 standard drinks of alcohol   Drug use: No   Sexual activity: Yes    Partners: Female  Other Topics Concern   Not on file  Social History Narrative   Marriedfor 52 years    Daughter, Physicist, medical (pediatrician--lives in [place], [place]), one in [place] - Warden/ranger. One in [place] - Chartered loss adjuster          Social Determinants of Health   Financial Resource Strain: Low Risk  (06/11/2022)   Overall Financial Resource Strain (CARDIA)    Difficulty of Paying Living Expenses: Not hard at all  Food Insecurity: No Food Insecurity (06/11/2022)   Hunger Vital Sign    Worried About Running Out of Food in the Last Year: Never true    Ran Out of Food in the Last Year: Never true  Transportation Needs: No Transportation Needs (06/11/2022)   PRAPARE - Administrator, Civil Service (Medical): No    Lack of Transportation (Non-Medical): No  Physical Activity: Sufficiently Active (06/11/2022)   Exercise Vital Sign    Days of Exercise per Week: 7 days    Minutes of Exercise per Session: 90 min  Stress: No Stress Concern Present (06/11/2022)   [employer] of Occupational Health - Occupational Stress Questionnaire    Feeling of  Stress : Not at all  Social Connections: Socially Integrated (06/11/2022)   Social Connection and Isolation Panel [NHANES]    Frequency of Communication with Friends and Family: More than three times a week    Frequency of Social Gatherings with Friends and Family: Twice a week    Attends Religious Services: More than 4 times per  year    Active Member of Clubs or Organizations: Yes    Attends Banker Meetings: More than 4 times per year    Marital Status: Married  Catering manager Violence: Not At Risk (09/09/2021)   Humiliation, Afraid, Rape, and Kick questionnaire    Fear of Current or Ex-Partner: No    Emotionally Abused: No    Physically Abused: No    Sexually Abused: No    Review of Systems: ROS is O/W negative except as mentioned in HPI.  Physical Exam: Vital signs in last 24 hours: Temp:  [97.6 F (36.4 C)-99.1 F (37.3 C)] 97.6 F (36.4 C) (08/30 0623) Pulse Rate:  [42-55] 42 (08/30 0534) Resp:  [11-18] 16 (08/30 0534) BP: (129-159)/(72-90) 159/72 (08/30 0534) SpO2:  [99 %-100 %] 99 % (08/30 0534) Weight:  [63.6 kg] 63.6 kg (08/29 1745) Last BM Date : 09/01/22 General:  Alert, Well-developed, well-nourished, pleasant and cooperative in NAD; deep jaundice noted Head:  Normocephalic and atraumatic. Eyes:  Scleral icterus noted. Ears:  Normal auditory acuity. Lungs:  Clear throughout to auscultation.  No wheezes, crackles, or rhonchi.  Heart:  Regular rate and rhythm; no murmurs, clicks, rubs, or gallops. Abdomen:  Soft, non-distended.  BS present.  Non-tender.   Msk:  Symmetrical without gross deformities.  Pulses:  Normal pulses noted. Extremities:  Without clubbing or edema. Neurologic:  Alert and oriented x 4;  grossly normal neurologically. Skin:  Intact without significant lesions or rashes. Psych:  Alert and cooperative. Normal mood and affect.  Intake/Output from previous day: 08/29 0701 - 08/30 0700 In: 113.6 [I.V.:113.6] Out: -   Lab  Results: Recent Labs    08/30/22 1500 08/31/22 1801 09/01/22 0444  WBC 8.0 9.1 8.2  HGB 13.5 13.4 12.8*  HCT 39.1 37.8* 35.6*  PLT 171 182 159   BMET Recent Labs    08/30/22 1500 08/31/22 1801 09/01/22 0444  NA 135 133* 136  K 4.8 4.4 3.6  CL 101 99 104  CO2 27 25 24   GLUCOSE 314* 274* 92  BUN 15 15 14   CREATININE 1.45* 1.04 1.04  CALCIUM 9.5 9.4 9.2   LFT Recent Labs    09/01/22 0444  PROT 6.0*  ALBUMIN 3.1*  AST 175*  ALT 334*  ALKPHOS 395*  BILITOT 13.4*   PT/INR Recent Labs    08/31/22 2330  LABPROT 15.1  INR 1.2   Studies/Results: DG CHEST PORT 1 VIEW  Result Date: 08/31/2022 CLINICAL DATA:  10031 Cough 10031 EXAM: PORTABLE CHEST 1 VIEW.  Patient is rotated. COMPARISON:  Chest x-ray 08/22/2022 FINDINGS: The heart and mediastinal contours are unchanged. Aortic calcification. No focal consolidation. No pulmonary edema. No pleural effusion. No pneumothorax. No acute osseous abnormality. IMPRESSION: No active disease. Electronically Signed   By: Tish Frederickson M.D.   On: 08/31/2022 22:38   CT ABDOMEN PELVIS W CONTRAST  Result Date: 08/31/2022 CLINICAL DATA:  Unintentional weight loss of about 30 pounds in 3-4 months. Painless jaundice. EXAM: CT ABDOMEN AND PELVIS WITH CONTRAST TECHNIQUE: Multidetector CT imaging of the abdomen and pelvis was performed using the standard protocol following bolus administration of intravenous contrast. RADIATION DOSE REDUCTION: This exam was performed according to the departmental dose-optimization program which includes automated exposure control, adjustment of the mA and/or kV according to patient size and/or use of iterative reconstruction technique. CONTRAST:  OMNIPAQUE IOHEXOL 300 MG/ML  SOLN COMPARISON:  Overlapping portions CT chest 06/05/2013 FINDINGS: Lower chest: Atheromatous vascular calcification of the right  coronary artery and descending thoracic aorta. Hepatobiliary: Prominent intrahepatic and extrahepatic  biliary dilatation extending down to the distal common bile duct in the vicinity of a pancreatic head mass. No definite focal metastatic lesions to the liver. Posterior right hepatic lobe cyst 1.5 by 1.0 cm on image 16 of series 301, no follow up of this lesion is indicated. Mildly distended gallbladder. Pancreas: Severe dorsal pancreatic duct dilatation extending to a pancreatic head mass strongly favoring adenocarcinoma. Size: 3.0 by 1.8 by 2.6 cm Location: Pancreatic head Characterization: Solid Enhancement: Hypoenhancing on portal venous phase images. Other Characteristics: Severe dorsal pancreatic duct dilatation Local extent of mass: No invasion of the duodenum. Vascular Involvement: The mass minimally abuts the posterior margin of the SMV without invasion. No involvement of the celiac trunk, common hepatic artery, gastroduodenal artery, or proper hepatic artery. Variant hepatic artery anatomy: Conventional Bile Duct Involvement: Marked narrowing of the distal CBD in the vicinity of the ampulla suggesting potential ampullary involvement by tumor. Variant biliary anatomy: No Adjacent Nodes: Absent Omental/Peritoneal Disease: Absent Distant Metastases: Absent Spleen: Unremarkable Adrenals/Urinary Tract: Fullness of the adrenal glands without mass. 3.2 by 1.8 cm homogeneous hyperdense lesion of the left kidney posteriorly on image 27 series 301, internal density 50 Hounsfield units, likely a complex cyst given the homogeneity, but technically nonspecific. This and the pancreatic lesion could be further characterized with dedicated pancreatic protocol MRI with and without contrast. Stomach/Bowel: Sigmoid colon diverticulosis. Lower rectal and anal wall thickening circumferentially, likely from nondistention but technically nonspecific, correlate with colon cancer screening history. Vascular/Lymphatic: Atherosclerosis is present, including aortoiliac atherosclerotic disease. Substantial gastric collateral vessels.  Dorsal atheromatous plaque at the origin of the celiac trunk and SMA without occlusion. There is also plaque further distally in the superior mesenteric artery. The IMA appears patent. No pathologic adenopathy observed. Reproductive: Unremarkable Other: No supplemental non-categorized findings. Musculoskeletal: Fatty right spermatic cord. Bilateral mild foraminal impingement at L5-S1 due to intervertebral and facet spurring. IMPRESSION: 1. 3 cm pancreatic head mass strongly favoring pancreatic adenocarcinoma. There is severe dorsal pancreatic duct dilatation and prominent intrahepatic and extrahepatic biliary dilatation extending down to the distal common bile duct in the vicinity of the pancreatic head mass. Other characteristics detailed above. 2. 3.2 by 1.8 cm homogeneous hyperdense lesion of the left kidney posteriorly, internal density 50 Hounsfield units, likely a complex cyst given the homogeneity, but technically nonspecific. This and the pancreatic lesion could be further characterized with dedicated pancreatic protocol MRI with and without contrast. 3. Sigmoid colon diverticulosis. 4. Lower rectal and anal wall thickening circumferentially, likely from nondistention but technically nonspecific, correlate with colon cancer screening history. 5. Bilateral mild foraminal impingement at L5-S1 due to intervertebral and facet spurring. 6. Aortic atherosclerosis. Aortic Atherosclerosis (ICD10-I70.0). Electronically Signed   By: Gaylyn Rong M.D.   On: 08/31/2022 15:09    IMPRESSION:  *82 year old male presenting with painless jaundice/biliary obstruction with a newly elevated total bilirubin/LFTs, bili up to 13.  CT scan yesterday showed a 3 cm pancreatic head mass strongly favoring pancreatic adenocarcinoma with severe pancreatic duct dilatation and prominent intra and extrahepatic biliary ductal dilatation extending down to the distal common bile duct.  MRI abdomen/MRCP showing small ill-defined  slightly diffusion restricting and hypoenhancing mass in the inferior pancreatic head and uncinate with abrupt truncation of the central pancreatic duct with severe pancreatic ductal dilatation and atrophy of the pancreas distally.  Has severe intra and extrahepatic ductal dilatation the common bile duct abruptly truncated with the central  pancreatic head consistent with pancreatic adenocarcinoma.    Reports 30-35 pound weight loss over the past months despite significant PO intake.    *CT scan also showing lower rectal and anal wall thickening circumferentially, possibly from nondistention, but they are saying technically nonspecific.  Last flex sig or colonoscopy was 2015.  MRI showing large colonic stool burden.  Patient reports having regular BMs, had one overnight.  PLAN: -Will plan for ERCP for decompression on 8/31 with Dr. Marina Goodell. -May need EUS with biopsy in the near future as well. -? Colonoscopy at some point as well to evaluate colon findings. -Trend LFTs/labs.  Follow-up CA 19-9.   Princella Pellegrini. Zehr  09/01/2022, 8:49 AM  GI ATTENDING  History, laboratories, x-rays reviewed.  Patient seen and examined.  Agree with comprehensive consultation note as outlined above.  Patient presents with painless jaundice.  Imaging is concerning for pancreatic cancer.  Plans tomorrow for ERCP with stent placement.The nature of the procedure, as well as the risks, benefits, and alternatives were carefully and thoroughly reviewed with the patient. Ample time for discussion and questions allowed. The patient understood, was satisfied, and agreed to proceed.  Wilhemina Bonito. Eda Keys., M.D. [facility] Division of Gastroenterology

## 2022-09-02 ENCOUNTER — Inpatient Hospital Stay (HOSPITAL_COMMUNITY): Payer: Medicare HMO

## 2022-09-02 ENCOUNTER — Encounter (HOSPITAL_COMMUNITY)
Admission: EM | Disposition: A | Discharge status: Home / Self Care | Payer: Self-pay | Source: Home / Self Care | Attending: Family Medicine

## 2022-09-02 ENCOUNTER — Inpatient Hospital Stay (HOSPITAL_COMMUNITY): Payer: Medicare HMO | Admitting: Certified Registered"

## 2022-09-02 ENCOUNTER — Encounter (HOSPITAL_COMMUNITY): Payer: Self-pay | Admitting: Internal Medicine

## 2022-09-02 DIAGNOSIS — E1165 Type 2 diabetes mellitus with hyperglycemia: Secondary | ICD-10-CM

## 2022-09-02 DIAGNOSIS — I10 Essential (primary) hypertension: Secondary | ICD-10-CM

## 2022-09-02 DIAGNOSIS — I25110 Atherosclerotic heart disease of native coronary artery with unstable angina pectoris: Secondary | ICD-10-CM

## 2022-09-02 DIAGNOSIS — K831 Obstruction of bile duct: Secondary | ICD-10-CM

## 2022-09-02 HISTORY — PX: SPHINCTEROTOMY: SHX5544

## 2022-09-02 HISTORY — PX: ENDOSCOPIC RETROGRADE CHOLANGIOPANCREATOGRAPHY (ERCP) WITH PROPOFOL: SHX5810

## 2022-09-02 HISTORY — PX: BILIARY STENT PLACEMENT: SHX5538

## 2022-09-02 LAB — GLUCOSE, CAPILLARY
Glucose-Capillary: 245 mg/dL — ABNORMAL HIGH (ref 70–99)
Glucose-Capillary: 402 mg/dL — ABNORMAL HIGH (ref 70–99)

## 2022-09-02 LAB — CANCER ANTIGEN 19-9
CA 19-9: 275 U/mL — ABNORMAL HIGH (ref 0–35)
CA 19-9: 282 U/mL — ABNORMAL HIGH (ref 0–35)

## 2022-09-02 SURGERY — ENDOSCOPIC RETROGRADE CHOLANGIOPANCREATOGRAPHY (ERCP) WITH PROPOFOL
Anesthesia: General

## 2022-09-02 MED ORDER — GLUCAGON HCL RDNA (DIAGNOSTIC) 1 MG IJ SOLR
INTRAMUSCULAR | Status: DC | PRN
Start: 2022-09-02 — End: 2022-09-02
  Administered 2022-09-02: .5 mg via INTRAVENOUS

## 2022-09-02 MED ORDER — LACTATED RINGERS IV SOLN: INTRAVENOUS | Status: DC | PRN

## 2022-09-02 MED ORDER — DEXAMETHASONE SODIUM PHOSPHATE 10 MG/ML IJ SOLN
INTRAMUSCULAR | Status: DC | PRN
  Administered 2022-09-02: 6 mg via INTRAVENOUS

## 2022-09-02 MED ORDER — FENTANYL CITRATE (PF) 100 MCG/2ML IJ SOLN
INTRAMUSCULAR | Status: AC
  Filled 2022-09-02: qty 2

## 2022-09-02 MED ORDER — ROCURONIUM BROMIDE 10 MG/ML (PF) SYRINGE
PREFILLED_SYRINGE | INTRAVENOUS | Status: DC | PRN
  Administered 2022-09-02: 50 mg via INTRAVENOUS

## 2022-09-02 MED ORDER — CIPROFLOXACIN IN D5W 400 MG/200ML IV SOLN
INTRAVENOUS | Status: AC
  Filled 2022-09-02: qty 200

## 2022-09-02 MED ORDER — LIDOCAINE HCL (CARDIAC) PF 100 MG/5ML IV SOSY
PREFILLED_SYRINGE | INTRAVENOUS | Status: DC | PRN
  Administered 2022-09-02: 60 mg via INTRATRACHEAL

## 2022-09-02 MED ORDER — PROPOFOL 10 MG/ML IV BOLUS
INTRAVENOUS | Status: DC | PRN
  Administered 2022-09-02: 130 mg via INTRAVENOUS
  Administered 2022-09-02: 50 mg via INTRAVENOUS

## 2022-09-02 MED ORDER — SODIUM CHLORIDE 0.9 % IV SOLN: INTRAVENOUS | Status: DC

## 2022-09-02 MED ORDER — SUCCINYLCHOLINE CHLORIDE 200 MG/10ML IV SOSY
PREFILLED_SYRINGE | INTRAVENOUS | Status: DC | PRN
Start: 2022-09-02 — End: 2022-09-02
  Administered 2022-09-02: 120 mg via INTRAVENOUS

## 2022-09-02 MED ORDER — INDOMETHACIN 50 MG RE SUPP
RECTAL | Status: DC | PRN
  Administered 2022-09-02: 100 mg via RECTAL

## 2022-09-02 MED ORDER — PROPOFOL 10 MG/ML IV BOLUS
INTRAVENOUS | Status: AC
  Filled 2022-09-02: qty 20

## 2022-09-02 MED ORDER — SODIUM CHLORIDE (PF) 0.9 % IJ SOLN
INTRAMUSCULAR | Status: DC | PRN
  Administered 2022-09-02: 90 mL

## 2022-09-02 MED ORDER — DICLOFENAC SUPPOSITORY 100 MG
RECTAL | Status: AC
  Filled 2022-09-02: qty 1

## 2022-09-02 MED ORDER — PROPOFOL 500 MG/50ML IV EMUL
INTRAVENOUS | Status: DC | PRN
Start: 2022-09-02 — End: 2022-09-02
  Administered 2022-09-02: 150 ug/kg/min via INTRAVENOUS

## 2022-09-02 MED ORDER — ONDANSETRON HCL 4 MG/2ML IJ SOLN
INTRAMUSCULAR | Status: DC | PRN
Start: 2022-09-02 — End: 2022-09-02
  Administered 2022-09-02: 4 mg via INTRAVENOUS

## 2022-09-02 MED ORDER — GLUCAGON HCL RDNA (DIAGNOSTIC) 1 MG IJ SOLR
INTRAMUSCULAR | Status: AC
  Filled 2022-09-02: qty 2

## 2022-09-02 MED ORDER — FENTANYL CITRATE (PF) 100 MCG/2ML IJ SOLN
INTRAMUSCULAR | Status: DC | PRN
  Administered 2022-09-02 (×2): 50 ug via INTRAVENOUS

## 2022-09-02 MED ORDER — LACTATED RINGERS IV SOLN
INTRAVENOUS | Status: AC | PRN
Start: 2022-09-02 — End: 2022-09-02
  Administered 2022-09-02: 1000 mL via INTRAVENOUS

## 2022-09-02 MED ORDER — INDOMETHACIN 50 MG RE SUPP
100.0000 mg | Freq: Once | RECTAL | Status: DC
  Filled 2022-09-02: qty 2

## 2022-09-02 MED ORDER — INDOMETHACIN 50 MG RE SUPP
RECTAL | Status: AC
  Filled 2022-09-02: qty 1

## 2022-09-02 MED ORDER — SODIUM CHLORIDE 0.9 % IV SOLN
1.5000 g | Freq: Once | INTRAVENOUS | Status: AC
  Administered 2022-09-02: 1.5 g via INTRAVENOUS
  Filled 2022-09-02: qty 4

## 2022-09-02 MED ORDER — PROPOFOL 1000 MG/100ML IV EMUL
INTRAVENOUS | Status: AC
  Filled 2022-09-02: qty 100

## 2022-09-02 MED ORDER — SUGAMMADEX SODIUM 200 MG/2ML IV SOLN
INTRAVENOUS | Status: DC | PRN
  Administered 2022-09-02: 200 mg via INTRAVENOUS

## 2022-09-02 NOTE — Op Note (Signed)
[facility] Patient Name: Alexander [name] Procedure Date: 09/02/2022 MRN: [id number] Attending MD: Wilhemina Bonito. Marina Goodell , MD, [id number] Date of Birth: 1940-05-12 CSN: [id number] Age: 82 Admit Type: Inpatient Procedure:                ERCP with biliary sphincterotomy and biliary stent                            placement Indications:              Common bile duct stricture, Biliary stricture on                            magnetic resonance cholangiopancreatography,                            Jaundice Providers:                Wilhemina Bonito. Marina Goodell, MD, Eliberto Ivory, RN, Rozetta Nunnery,                            Technician Referring MD:             Dr. Arlan Organ Medicines:                General Anesthesia Complications:            No immediate complications. Estimated Blood Loss:     Estimated blood loss: none. Procedure:                Pre-Anesthesia Assessment:                           - Prior to the procedure, a History and Physical                            was performed, and patient medications and                            allergies were reviewed. The patient's tolerance of                            previous anesthesia was also reviewed. The risks                            and benefits of the procedure and the sedation                            options and risks were discussed with the patient.                            All questions were answered, and informed consent                            was obtained. Prior Anticoagulants: The patient has                            taken no anticoagulant or antiplatelet  agents. ASA                            Grade Assessment: II - A patient with mild systemic                            disease. After reviewing the risks and benefits,                            the patient was deemed in satisfactory condition to                            undergo the procedure.                           After obtaining informed consent, the  scope was                            passed under direct vision. Throughout the                            procedure, the patient's blood pressure, pulse, and                            oxygen saturations were monitored continuously. The                            TJF-Q190V ([id number] Olympus duodenoscope was                            introduced through the mouth, and used to inject                            contrast into and used to inject contrast into the                            bile duct and ventral pancreatic duct. The ERCP was                            accomplished without difficulty. The patient                            tolerated the procedure well. Scope In: Scope Out: Findings:      1. The side-viewing duodena scope was passed blindly into the esophagus.       The stomach, duodenum, and major ampulla were normal.      2. Scout radiograph of the abdomen with the endoscope and position was       unremarkable.      3. Initial injection of contrast yielded short filling of the distal       pancreatic duct. The wire was subsequently passed into the pancreatic       duct      4. Subsequent injection of contrast yielded filling of the biliary tree.       There was an obvious distal stricture measuring  about 2 cm. The duct was       deeply cannulated with the wire. Further filling demonstrated marked       dilation above the strictured region.      5. A biliary sphincterotomy was made by cutting over the hydrophilic       guidewire in the 12:00 orientation with the ERBE system. No bleeding.      6. A 10 French in diameter 7 cm in length biliary plastic endoprosthesis       (stent) was placed into the common bile duct in good position with       excellent drainage. Impression:               1. Malignant appearing distal biliary stricture                            (likely pancreatic cancer) with biliary obstruction.                           2. Status post ERCP with  sphincterotomy and biliary                            stent placement. Moderate Sedation:      none Recommendation:           1. Standard post ERCP care                           2. Trend liver tests                           3. Low-fat diet.                           4. If doing well could go home this evening or in                            the morning                           5. We will arrange outpatient GI follow-up. He will                            likely need endoscopic ultrasound with FNA as the                            next step.                           The above discussed with the patient. He was                            provided a copy of this report Procedure Code(s):        --- Professional ---                           [phone], Endoscopic retrograde  cholangiopancreatography (ERCP); with placement of                            endoscopic stent into biliary or pancreatic duct,                            including pre- and post-dilation and guide wire                            passage, when performed, including sphincterotomy,                            when performed, each stent Diagnosis Code(s):        --- Professional ---                           K83.1, Obstruction of bile duct                           R17, Unspecified jaundice CPT copyright 2022 [employer]. All rights reserved. The codes documented in this report are preliminary and upon coder review may  be revised to meet current compliance requirements. Wilhemina Bonito. Marina Goodell, MD 09/02/2022 12:30:57 PM This report has been signed electronically. Number of Addenda: 0

## 2022-09-02 NOTE — Progress Notes (Signed)
I looks like he is going to have his ERCP today.  I am so impressed that Gastroenterology will be able to do this.  He did have the MRCP yesterday.  Again, it looks like he has resectable disease.  An MRCP does not show that this tumor is involving the arterial structures.  We really need to get surgery to see him to see if they feel this can be resected upfront or they still think he needs some form of neoadjuvant chemotherapy.  Hopefully, Gastroenterology will be able to stent the biliary duct so that the bilirubin can drain.  There is no labs back yet today.   He feels great.  Hopefully, if everything goes well today, he will be able to go home tomorrow.  Again, I would think this is going to be a surgical issue.  He has had no fever.  He has had no chills.  He is out of bed.  He is still in great shape.  His vital signs are stable.  Blood pressure 140/87.  Temperature 98.2.  Pulse 56.  There is really no change in his overall physical exam.  I am still awaiting the CA 19-9.  I suspect this probably would be elevated just on the basis of the biliary obstruction.  Again, I really appreciate Gastroenterology's effort to try to stent the biliary system today.    Christin Bach, MD  Franky Macho 1:37

## 2022-09-02 NOTE — Progress Notes (Addendum)
PROGRESS NOTE    Alexander [name]  MVH:[id number] DOB: 1940/11/27 DOA: 08/31/2022 PCP: Shirline Frees, NP   Brief Narrative: Alexander [name] is a 82 y.o. male with a history of diabetes mellitus type 2, polycythemia vera, hypertension, CAD, GERD, GI bleed, gout, hemorrhoids, chronic back pain.  Patient presented secondary to abnormal imaging with CT abdomen/pelvis significant for a pancreatic head mass which is concerning for pancreatic adenocarcinoma; imaging also concerning for evidence of biliary obstruction. Labs suggest the same.  GI consulted for ERCP.   Assessment and Plan:  Biliary obstruction Painless jaundice CT scan significant for evidence of a 3 cm pancreatic head mass concerning for pancreatic adenocarcinoma with associated severe dorsal pancreatic duct dilation and prominent intrahepatic/extrahepatic biliary dilation down to distal common bile duct. GI consulted and performed an ERCP with sphincterotomy and biliary stent placement on 8/31; malignant appearing distal biliary stricture was noted with concern for pancreatic cancer.  Patient will need outpatient GI follow-up.  Pancreatitic mass New diagnosis. Concerning for pancreatic adenocarcinoma. GI consulted and will perform ERCP.  Polycythemia vera Patient follows with Hematology as an outpatient.  Hyperlipidemia Pravastatin held secondary to elevated LFTs  Diabetes mellitus type 2 Well-controlled for age. Patient is on glipizide as an outpatient. -SSI  Primary hypertension Patient is on amlodipine and lisinopril.  CAD Patient is on aspirin and pravastatin as an outpatient, which were held.  Addendum: Patient noted to have heart rates into the high 30s to mid 40s. Asymptomatic. Patient takes amlodipine which could possibly be contributory. Patient is also post-anesthesia, which may be etiology. EKG obtained and is significant for sinus bradycardia. Will change level of care to telemetry and add cardiac  monitoring. Discontinue amlodipine.  DVT prophylaxis: SCDs Code Status:   Code Status: Full Code Family Communication: Wife at bedside Disposition Plan: Discharge home in 24 hours   Consultants:  [facility]  Procedures:  8/31: ERCP with biliary sphincterotomy and biliary stent placement  Antimicrobials: None    Subjective: No issues this morning.  Patient reports continued lack of symptoms.  Objective: BP 128/82   Pulse (!) 44   Temp 98.3 F (36.8 C) (Temporal)   Resp (!) 25   Ht 5\' 11"  (1.803 m)   Wt 62.1 kg   SpO2 100%   BMI 19.11 kg/m   Examination:  General exam: Appears calm and comfortable Respiratory system: Clear to auscultation. Respiratory effort normal. Cardiovascular system: S1 & S2 heard, Normal rate with regular rhythm. Gastrointestinal system: Abdomen is soft and nontender.  Normal bowel sounds heard. Central nervous system: Alert and oriented. No focal neurological deficits. Musculoskeletal: No edema. No calf tenderness Skin: No cyanosis.  Jaundice. Psychiatry: Judgement and insight appear normal. Mood & affect appropriate.    Data Reviewed: I have personally reviewed following labs and imaging studies  CBC Lab Results  Component Value Date   WBC 8.2 09/01/2022   RBC 3.83 (L) 09/01/2022   HGB 12.8 (L) 09/01/2022   HCT 35.6 (L) 09/01/2022   MCV 93.0 09/01/2022   MCH 33.4 09/01/2022   PLT 159 09/01/2022   MCHC 36.0 09/01/2022   RDW 15.9 (H) 09/01/2022   LYMPHSABS 1.3 08/30/2022   MONOABS 1.0 08/30/2022   EOSABS 0.1 08/30/2022   BASOSABS 0.1 08/30/2022     Last metabolic panel Lab Results  Component Value Date   NA 136 09/01/2022   K 3.6 09/01/2022   CL 104 09/01/2022   CO2 24 09/01/2022   BUN 14 09/01/2022  CREATININE 1.04 09/01/2022   GLUCOSE 92 09/01/2022   GFRNONAA >60 09/01/2022   GFRAA >60 07/25/2019   CALCIUM 9.2 09/01/2022   PHOS 3.4 09/01/2022   PROT 6.0 (L) 09/01/2022   ALBUMIN 3.1  (L) 09/01/2022   LABGLOB 2.9 03/29/2016   AGRATIO 1.4 03/29/2016   BILITOT 13.4 (H) 09/01/2022   ALKPHOS 395 (H) 09/01/2022   AST 175 (H) 09/01/2022   ALT 334 (H) 09/01/2022   ANIONGAP 8 09/01/2022    GFR: Estimated Creatinine Clearance: 48.1 mL/min (by C-G formula based on SCr of 1.04 mg/dL).  No results found for this or any previous visit (from the past 240 hour(s)).    Radiology Studies: DG ERCP  Result Date: 09/02/2022 CLINICAL DATA:  Biliary ductal dilatation. Possible malignant obstruction. EXAM: ERCP TECHNIQUE: Multiple spot images obtained with the fluoroscopic device and submitted for interpretation post-procedure. FLUOROSCOPY: Radiation Exposure Index (as provided by the fluoroscopic device): 346.03 mGy Kerma COMPARISON:  MRCP 09/01/2022 FINDINGS: A total of 26 saved intraoperative images are submitted for review. The images demonstrate a flexible duodenal scope in the descending duodenum. Initial wire catheterization of the main pancreatic duct with contrast injection. Ultimately, wire cannulation of the intrahepatic biliary tree is performed. Contrast injection demonstrates biliary ductal dilatation with occlusion distally. Subsequent images show sphincterotomy and placement of a plastic biliary stent. IMPRESSION: ERCP with sphincterotomy and placement of a plastic biliary stent. Proximal and intrahepatic biliary ductal dilatation with high-grade stenosis of the mid and distal common duct. These images were submitted for radiologic interpretation only. Please see the procedural report for the amount of contrast and the fluoroscopy time utilized. Electronically Signed   By: Malachy Moan M.D.   On: 09/02/2022 12:31   DG C-Arm 1-60 Min-No Report  Result Date: 09/02/2022 Fluoroscopy was utilized by the requesting physician.  No radiographic interpretation.   MR ABDOMEN MRCP W WO CONTAST  Result Date: 09/01/2022 CLINICAL DATA:  Evaluate pancreatic mass identified by CT EXAM:  MRI ABDOMEN WITHOUT AND WITH CONTRAST (INCLUDING MRCP) TECHNIQUE: Multiplanar multisequence MR imaging of the abdomen was performed both before and after the administration of intravenous contrast. Heavily T2-weighted images of the biliary and pancreatic ducts were obtained, and three-dimensional MRCP images were rendered by post processing. CONTRAST:  6mL GADAVIST GADOBUTROL 1 MMOL/ML IV SOLN COMPARISON:  CT abdomen pelvis, 08/31/2022 FINDINGS: Lower chest: No acute abnormality. Hepatobiliary: No solid liver abnormality is seen. Simple benign cyst of the posterior liver dome, requiring no specific further follow-up or characterization. No gallstones or gallbladder wall thickening. Mild gallbladder distention and adenomyomatosis of the fundus. Severe intrahepatic biliary ductal dilatation the common bile duct measuring up to 1.7 cm in caliber and abruptly truncated within the central pancreatic head (series 4, image 15). Pancreas: Small, ill-defined, subtly diffusion restricting and hypoenhancing mass in the inferior pancreatic head and uncinate, difficult to precisely delineate although measuring approximately 2.5 x 2.0 x 1.9 cm (series 21, image 30, series 15, image 54). Abrupt truncation of the central pancreatic duct with severe ductal dilatation along the length of the remaining pancreas, up to 1.5 cm in caliber, and severe atrophy of the distal pancreatic parenchyma. Spleen: Normal in size without significant abnormality. Adrenals/Urinary Tract: Adrenal glands are unremarkable. Benign hemorrhagic or proteinaceous cyst of the posterior midportion of the left kidney, requiring no further follow-up or characterization (series 5, image 16). Kidneys are otherwise normal, without obvious renal calculi, solid lesion, or hydronephrosis. Stomach/Bowel: Stomach is within normal limits. No evidence of bowel wall thickening,  distention, or inflammatory changes. Large burden of stool in the colon. Vascular/Lymphatic:  Aortic atherosclerosis. Mass most likely makes minimal contact to the posterior superior mesenteric vein near the confluence of the portal vein (e.g. Series 17, image 49), but appears to have preserved fat planes to the adjacent superior mesenteric artery and the portal vein proper. No enlarged abdominal lymph nodes. Other: No abdominal wall hernia or abnormality. No ascites. Musculoskeletal: No acute or significant osseous findings. IMPRESSION: 1. Small, ill-defined, subtly diffusion restricting and hypoenhancing mass in the inferior pancreatic head and uncinate, difficult to precisely delineate although measuring approximately 2.5 x 2.0 x 1.9 cm. Abrupt truncation of the central pancreatic duct with severe pancreatic ductal dilatation and atrophy of the pancreas distally. 2. Severe intra and extrahepatic biliary ductal dilatation, the common bile duct abruptly truncated within the central pancreatic head. 3. Findings consistent with pancreatic adenocarcinoma. 4. For purposes of local staging, mass most likely makes minimal contact to the posterior superior mesenteric vein near the confluence of the portal vein but appears to have preserved fat planes to the adjacent superior mesenteric artery and the portal vein proper. 5. No evidence of lymphadenopathy or organ metastatic disease in the abdomen. 6. Large burden of stool in the colon. Aortic Atherosclerosis (ICD10-I70.0). Electronically Signed   By: Jearld Lesch M.D.   On: 09/01/2022 09:13   MR 3D Recon At Scanner  Result Date: 09/01/2022 CLINICAL DATA:  Evaluate pancreatic mass identified by CT EXAM: MRI ABDOMEN WITHOUT AND WITH CONTRAST (INCLUDING MRCP) TECHNIQUE: Multiplanar multisequence MR imaging of the abdomen was performed both before and after the administration of intravenous contrast. Heavily T2-weighted images of the biliary and pancreatic ducts were obtained, and three-dimensional MRCP images were rendered by post processing. CONTRAST:  6mL  GADAVIST GADOBUTROL 1 MMOL/ML IV SOLN COMPARISON:  CT abdomen pelvis, 08/31/2022 FINDINGS: Lower chest: No acute abnormality. Hepatobiliary: No solid liver abnormality is seen. Simple benign cyst of the posterior liver dome, requiring no specific further follow-up or characterization. No gallstones or gallbladder wall thickening. Mild gallbladder distention and adenomyomatosis of the fundus. Severe intrahepatic biliary ductal dilatation the common bile duct measuring up to 1.7 cm in caliber and abruptly truncated within the central pancreatic head (series 4, image 15). Pancreas: Small, ill-defined, subtly diffusion restricting and hypoenhancing mass in the inferior pancreatic head and uncinate, difficult to precisely delineate although measuring approximately 2.5 x 2.0 x 1.9 cm (series 21, image 30, series 15, image 54). Abrupt truncation of the central pancreatic duct with severe ductal dilatation along the length of the remaining pancreas, up to 1.5 cm in caliber, and severe atrophy of the distal pancreatic parenchyma. Spleen: Normal in size without significant abnormality. Adrenals/Urinary Tract: Adrenal glands are unremarkable. Benign hemorrhagic or proteinaceous cyst of the posterior midportion of the left kidney, requiring no further follow-up or characterization (series 5, image 16). Kidneys are otherwise normal, without obvious renal calculi, solid lesion, or hydronephrosis. Stomach/Bowel: Stomach is within normal limits. No evidence of bowel wall thickening, distention, or inflammatory changes. Large burden of stool in the colon. Vascular/Lymphatic: Aortic atherosclerosis. Mass most likely makes minimal contact to the posterior superior mesenteric vein near the confluence of the portal vein (e.g. Series 17, image 49), but appears to have preserved fat planes to the adjacent superior mesenteric artery and the portal vein proper. No enlarged abdominal lymph nodes. Other: No abdominal wall hernia or  abnormality. No ascites. Musculoskeletal: No acute or significant osseous findings. IMPRESSION: 1. Small, ill-defined, subtly diffusion restricting and  hypoenhancing mass in the inferior pancreatic head and uncinate, difficult to precisely delineate although measuring approximately 2.5 x 2.0 x 1.9 cm. Abrupt truncation of the central pancreatic duct with severe pancreatic ductal dilatation and atrophy of the pancreas distally. 2. Severe intra and extrahepatic biliary ductal dilatation, the common bile duct abruptly truncated within the central pancreatic head. 3. Findings consistent with pancreatic adenocarcinoma. 4. For purposes of local staging, mass most likely makes minimal contact to the posterior superior mesenteric vein near the confluence of the portal vein but appears to have preserved fat planes to the adjacent superior mesenteric artery and the portal vein proper. 5. No evidence of lymphadenopathy or organ metastatic disease in the abdomen. 6. Large burden of stool in the colon. Aortic Atherosclerosis (ICD10-I70.0). Electronically Signed   By: Jearld Lesch M.D.   On: 09/01/2022 09:13   DG CHEST PORT 1 VIEW  Result Date: 08/31/2022 CLINICAL DATA:  10031 Cough 10031 EXAM: PORTABLE CHEST 1 VIEW.  Patient is rotated. COMPARISON:  Chest x-ray 08/22/2022 FINDINGS: The heart and mediastinal contours are unchanged. Aortic calcification. No focal consolidation. No pulmonary edema. No pleural effusion. No pneumothorax. No acute osseous abnormality. IMPRESSION: No active disease. Electronically Signed   By: Tish Frederickson M.D.   On: 08/31/2022 22:38   CT ABDOMEN PELVIS W CONTRAST  Result Date: 08/31/2022 CLINICAL DATA:  Unintentional weight loss of about 30 pounds in 3-4 months. Painless jaundice. EXAM: CT ABDOMEN AND PELVIS WITH CONTRAST TECHNIQUE: Multidetector CT imaging of the abdomen and pelvis was performed using the standard protocol following bolus administration of intravenous contrast. RADIATION  DOSE REDUCTION: This exam was performed according to the departmental dose-optimization program which includes automated exposure control, adjustment of the mA and/or kV according to patient size and/or use of iterative reconstruction technique. CONTRAST:  OMNIPAQUE IOHEXOL 300 MG/ML  SOLN COMPARISON:  Overlapping portions CT chest 06/05/2013 FINDINGS: Lower chest: Atheromatous vascular calcification of the right coronary artery and descending thoracic aorta. Hepatobiliary: Prominent intrahepatic and extrahepatic biliary dilatation extending down to the distal common bile duct in the vicinity of a pancreatic head mass. No definite focal metastatic lesions to the liver. Posterior right hepatic lobe cyst 1.5 by 1.0 cm on image 16 of series 301, no follow up of this lesion is indicated. Mildly distended gallbladder. Pancreas: Severe dorsal pancreatic duct dilatation extending to a pancreatic head mass strongly favoring adenocarcinoma. Size: 3.0 by 1.8 by 2.6 cm Location: Pancreatic head Characterization: Solid Enhancement: Hypoenhancing on portal venous phase images. Other Characteristics: Severe dorsal pancreatic duct dilatation Local extent of mass: No invasion of the duodenum. Vascular Involvement: The mass minimally abuts the posterior margin of the SMV without invasion. No involvement of the celiac trunk, common hepatic artery, gastroduodenal artery, or proper hepatic artery. Variant hepatic artery anatomy: Conventional Bile Duct Involvement: Marked narrowing of the distal CBD in the vicinity of the ampulla suggesting potential ampullary involvement by tumor. Variant biliary anatomy: No Adjacent Nodes: Absent Omental/Peritoneal Disease: Absent Distant Metastases: Absent Spleen: Unremarkable Adrenals/Urinary Tract: Fullness of the adrenal glands without mass. 3.2 by 1.8 cm homogeneous hyperdense lesion of the left kidney posteriorly on image 27 series 301, internal density 50 Hounsfield units, likely a  complex cyst given the homogeneity, but technically nonspecific. This and the pancreatic lesion could be further characterized with dedicated pancreatic protocol MRI with and without contrast. Stomach/Bowel: Sigmoid colon diverticulosis. Lower rectal and anal wall thickening circumferentially, likely from nondistention but technically nonspecific, correlate with colon cancer screening history. Vascular/Lymphatic:  Atherosclerosis is present, including aortoiliac atherosclerotic disease. Substantial gastric collateral vessels. Dorsal atheromatous plaque at the origin of the celiac trunk and SMA without occlusion. There is also plaque further distally in the superior mesenteric artery. The IMA appears patent. No pathologic adenopathy observed. Reproductive: Unremarkable Other: No supplemental non-categorized findings. Musculoskeletal: Fatty right spermatic cord. Bilateral mild foraminal impingement at L5-S1 due to intervertebral and facet spurring. IMPRESSION: 1. 3 cm pancreatic head mass strongly favoring pancreatic adenocarcinoma. There is severe dorsal pancreatic duct dilatation and prominent intrahepatic and extrahepatic biliary dilatation extending down to the distal common bile duct in the vicinity of the pancreatic head mass. Other characteristics detailed above. 2. 3.2 by 1.8 cm homogeneous hyperdense lesion of the left kidney posteriorly, internal density 50 Hounsfield units, likely a complex cyst given the homogeneity, but technically nonspecific. This and the pancreatic lesion could be further characterized with dedicated pancreatic protocol MRI with and without contrast. 3. Sigmoid colon diverticulosis. 4. Lower rectal and anal wall thickening circumferentially, likely from nondistention but technically nonspecific, correlate with colon cancer screening history. 5. Bilateral mild foraminal impingement at L5-S1 due to intervertebral and facet spurring. 6. Aortic atherosclerosis. Aortic Atherosclerosis  (ICD10-I70.0). Electronically Signed   By: Gaylyn Rong M.D.   On: 08/31/2022 15:09      LOS: 2 days    Jacquelin Hawking, MD [facility] 09/02/2022, 1:02 PM   If 7PM-7AM, please contact night-coverage [email or web address]

## 2022-09-02 NOTE — Anesthesia Procedure Notes (Addendum)
Procedure Name: Intubation Date/Time: 09/02/2022 11:10 AM  Performed by: Oletha Cruel, CRNAPre-anesthesia Checklist: Patient identified, Emergency Drugs available, Suction available and Patient being monitored Patient Re-evaluated:Patient Re-evaluated prior to induction Oxygen Delivery Method: Circle system utilized Preoxygenation: Pre-oxygenation with 100% oxygen Induction Type: IV induction and Rapid sequence Laryngoscope Size: Mac and 4 Grade View: Grade II Tube type: Oral Tube size: 7.0 mm Number of attempts: 1 Airway Equipment and Method: Stylet Placement Confirmation: ETT inserted through vocal cords under direct vision, positive ETCO2, CO2 detector and breath sounds checked- equal and bilateral Secured at: 22 cm Tube secured with: Tape Dental Injury: Teeth and Oropharynx as per pre-operative assessment  Comments: RSI-Patient preoxygenated on 100% oxygen via face mask.. No mask ventilation prior to induction. Atraumatic intubation.

## 2022-09-02 NOTE — Anesthesia Postprocedure Evaluation (Addendum)
Anesthesia Post Note  Patient: [name]  Procedure(s) Performed: ENDOSCOPIC RETROGRADE CHOLANGIOPANCREATOGRAPHY (ERCP) WITH PROPOFOL SPHINCTEROTOMY BILIARY STENT PLACEMENT     Patient location during evaluation: PACU Anesthesia Type: General Level of consciousness: awake and alert Pain management: pain level controlled Vital Signs Assessment: post-procedure vital signs reviewed and stable Respiratory status: spontaneous breathing, nonlabored ventilation and respiratory function stable Cardiovascular status: stable, blood pressure returned to baseline and bradycardic Anesthetic complications: no   No notable events documented.  Last Vitals:  Vitals:   09/02/22 1245 09/02/22 1315  BP: 128/82 (!) 162/73  Pulse: (!) 44 (!) 40  Resp: (!) 25 20  Temp:  (!) 36.4 C  SpO2: 100% 100%    Last Pain:  Vitals:   09/02/22 1315  TempSrc: Oral  PainSc:                  Alexander [name]

## 2022-09-02 NOTE — Transfer of Care (Addendum)
Immediate Anesthesia Transfer of Care Note  Patient: [name]  Procedure(s) Performed: ENDOSCOPIC RETROGRADE CHOLANGIOPANCREATOGRAPHY (ERCP) WITH PROPOFOL SPHINCTEROTOMY BILIARY STENT PLACEMENT  Patient Location: PACU  Anesthesia Type:General  Level of Consciousness: awake and patient cooperative  Airway & Oxygen Therapy: Patient Spontanous Breathing and Patient connected to face mask oxygen  Post-op Assessment: Report given to RN and Post -op Vital signs reviewed and stable  Post vital signs: Reviewed and stable  Last Vitals:  Vitals Value Taken Time  BP 132/72 09/02/22 1234  Temp 36.8 09/02/22   1240  Pulse 43 09/02/22 1239  Resp 11 09/02/22 1239  SpO2 100 % 09/02/22 1239  Vitals shown include unfiled device data.  Last Pain:  Vitals:   09/02/22 1052  TempSrc: Temporal  PainSc: 0-No pain      Patients Stated Pain Goal: 2 (09/01/22 2015)  Complications: No notable events documented.

## 2022-09-02 NOTE — Interval H&P Note (Signed)
History and Physical Interval Note:  09/02/2022 11:05 AM  Alexander [name]  has presented today for surgery, with the diagnosis of Biliary obstruction, pancreatic mass.  The various methods of treatment have been discussed with the patient and family. After consideration of risks (including pancreatitis, perforation, bleeding, infection), benefits and other options for treatment, the patient has consented to  Procedure(s): ENDOSCOPIC RETROGRADE CHOLANGIOPANCREATOGRAPHY (ERCP) WITH PROPOFOL (N/A) as a surgical intervention.  The patient's history has been reviewed, patient examined, no change in status, stable for surgery.  I have reviewed the patient's chart and labs.  Questions were answered to the patient's satisfaction.     [name]

## 2022-09-02 NOTE — Anesthesia Preprocedure Evaluation (Addendum)
Anesthesia Evaluation  Patient identified by MRN, date of birth, ID band Patient awake    Reviewed: Allergy & Precautions, NPO status , Patient's Chart, lab work & pertinent test results  History of Anesthesia Complications Negative for: history of anesthetic complications  Airway Mallampati: I  TM Distance: >3 FB Neck ROM: Full    Dental  (+) Dental Advisory Given, Teeth Intact   Pulmonary neg pulmonary ROS   Pulmonary exam normal        Cardiovascular hypertension, Pt. on medications + CAD   Rhythm:Regular Rate:Bradycardia     Neuro/Psych negative neurological ROS  negative psych ROS   GI/Hepatic Neg liver ROS,GERD  Medicated and Controlled,, Pancreatic mass    Endo/Other  diabetes, Type 2    Renal/GU negative Renal ROS     Musculoskeletal  (+) Arthritis ,   TMJ dysfunction    Abdominal   Peds  Hematology  (+) Blood dyscrasia, anemia  Polycythemia rubra vera    Anesthesia Other Findings   Reproductive/Obstetrics                              Anesthesia Physical Anesthesia Plan  ASA: 3  Anesthesia Plan: General   Post-op Pain Management: Minimal or no pain anticipated   Induction: Intravenous  PONV Risk Score and Plan: 2 and Treatment may vary due to age or medical condition, Ondansetron and Propofol infusion  Airway Management Planned: Oral ETT  Additional Equipment: None  Intra-op Plan:   Post-operative Plan: Extubation in OR  Informed Consent: I have reviewed the patients History and Physical, chart, labs and discussed the procedure including the risks, benefits and alternatives for the proposed anesthesia with the patient or authorized representative who has indicated his/her understanding and acceptance.     Dental advisory given  Plan Discussed with: CRNA and Anesthesiologist  Anesthesia Plan Comments:         Anesthesia Quick Evaluation

## 2022-09-03 DIAGNOSIS — R739 Hyperglycemia, unspecified: Secondary | ICD-10-CM

## 2022-09-03 DIAGNOSIS — K831 Obstruction of bile duct: Secondary | ICD-10-CM | POA: Diagnosis not present

## 2022-09-03 DIAGNOSIS — K8689 Other specified diseases of pancreas: Secondary | ICD-10-CM | POA: Diagnosis not present

## 2022-09-03 LAB — COMPREHENSIVE METABOLIC PANEL
ALT: 278 U/L — ABNORMAL HIGH (ref 0–44)
AST: 86 U/L — ABNORMAL HIGH (ref 15–41)
Albumin: 3.4 g/dL — ABNORMAL LOW (ref 3.5–5.0)
Alkaline Phosphatase: 388 U/L — ABNORMAL HIGH (ref 38–126)
Anion gap: 10 (ref 5–15)
BUN: 24 mg/dL — ABNORMAL HIGH (ref 8–23)
CO2: 24 mmol/L (ref 22–32)
Calcium: 9.1 mg/dL (ref 8.9–10.3)
Chloride: 101 mmol/L (ref 98–111)
Creatinine, Ser: 1.19 mg/dL (ref 0.61–1.24)
GFR, Estimated: >60 mL/min (ref >60)
Glucose, Bld: 120 mg/dL — ABNORMAL HIGH (ref 70–99)
Potassium: 3.7 mmol/L (ref 3.5–5.1)
Sodium: 135 mmol/L (ref 135–145)
Total Bilirubin: 9 mg/dL — ABNORMAL HIGH (ref 0.3–1.2)
Total Protein: 6.5 g/dL (ref 6.5–8.1)

## 2022-09-03 LAB — GLUCOSE, RANDOM: Glucose, Bld: 406 mg/dL — ABNORMAL HIGH (ref 70–99)

## 2022-09-03 LAB — GLUCOSE, CAPILLARY: Glucose-Capillary: 94 mg/dL (ref 70–99)

## 2022-09-03 MED ORDER — INSULIN ASPART 100 UNIT/ML IJ SOLN
10.0000 [IU] | Freq: Once | INTRAMUSCULAR | Status: AC
  Administered 2022-09-03: 10 [IU] via SUBCUTANEOUS

## 2022-09-03 MED ORDER — AMLODIPINE BESYLATE 10 MG PO TABS
10.0000 mg | ORAL_TABLET | Freq: Every day | ORAL | Status: DC
  Administered 2022-09-03: 10 mg via ORAL
  Filled 2022-09-03: qty 1

## 2022-09-03 NOTE — Progress Notes (Signed)
Informed J. Garner Nash, NP of current glucose level that was drawn by lab being 406. Orders received.

## 2022-09-03 NOTE — Discharge Summary (Signed)
Physician Discharge Summary   Patient: Alexander [name] MRN: [id number] DOB: [birth year]  Admit date:     08/31/2022  Discharge date: 09/03/22  Discharge Physician: Jacquelin Hawking, MD   PCP: Shirline Frees, NP   Recommendations at discharge:  PCP follow-up GI follow-up  Discharge Diagnoses: Principal Problem:   Bile duct stricture Active Problems:   Polycythemia vera (HCC)   Hyperlipidemia   Essential hypertension   CAD (coronary artery disease)   Mass of head of pancreas   Elevated liver function tests   Loss of weight  Resolved Problems:   * No resolved hospital problems. *  Hospital Course: Alexander [name] is a 82 y.o. male with a history of diabetes mellitus type 2, polycythemia vera, hypertension, CAD, GERD, GI bleed, gout, hemorrhoids, chronic back pain.  Patient presented secondary to abnormal imaging with CT abdomen/pelvis significant for a pancreatic head mass which is concerning for pancreatic adenocarcinoma; imaging also concerning for evidence of biliary obstruction. Labs suggest the same.  GI consulted and performed an ERCP with sphincterotomy and biliary stent placement on 8/31. Patient to follow-up with GI as an outpatient.  Assessment and Plan:  Biliary obstruction Painless jaundice CT scan significant for evidence of a 3 cm pancreatic head mass concerning for pancreatic adenocarcinoma with associated severe dorsal pancreatic duct dilation and prominent intrahepatic/extrahepatic biliary dilation down to distal common bile duct. GI consulted and performed an ERCP with sphincterotomy and biliary stent placement on 8/31; malignant appearing distal biliary stricture was noted with concern for pancreatic cancer.  Patient will need outpatient GI follow-up.   Pancreatitic mass New diagnosis. Concerning for pancreatic adenocarcinoma. Patient to follow-up outpatient with GI for biopsy.   Polycythemia vera Patient follows with Hematology as an outpatient.    Hyperlipidemia Pravastatin held secondary to elevated LFTs   Diabetes mellitus type 2 Well-controlled for age. Patient is on glipizide as an outpatient. Significant hyperglycemia was noted while inpatient, but hemoglobin A1C is 7.5%. Recommend to resume home regimen.   Primary hypertension Patient is on amlodipine and lisinopril.  Sinus bradycardia Chronic. Acutely worsened with rates into the 30 bpm range likely related to anesthesia.   CAD Patient is on aspirin and pravastatin as an outpatient, which were held. Resume on discharge.   Consultants:  [facility]ology   Procedures:  8/31: ERCP with biliary sphincterotomy and biliary stent placement  Disposition: Home Diet recommendation: Regular diet   DISCHARGE MEDICATION: Allergies as of 09/03/2022       Reactions   Metformin And Related Diarrhea   Metformin Hcl Diarrhea        Medication List     STOP taking these medications    doxycycline 100 MG tablet Commonly known as: VIBRA-TABS       TAKE these medications    Accu-Chek Aviva Plus w/Device Kit Use to test blood glucose once daily   Accu-Chek Softclix Lancets lancets Use to test blood glucose once daily   allopurinol 100 MG tablet Commonly known as: ZYLOPRIM TAKE ONE TABLET BY MOUTH DAILY   amLODipine 10 MG tablet Commonly known as: NORVASC TAKE ONE TABLET BY MOUTH DAILY   aspirin EC 81 MG tablet Take 1 tablet (81 mg total) by mouth daily.   Coenzyme Q10 300 MG Caps Take 1 capsule by mouth daily.   Embrace Pro Glucose Control Liqd Use to check blood glucose TID   Embrace Pro Glucose Test test strip Generic drug: glucose blood Use as instructed   glipiZIDE 2.5 MG 24  hr tablet Commonly known as: GLUCOTROL XL Take 1 tablet (2.5 mg total) by mouth daily with breakfast.   hydrocortisone 25 MG suppository Commonly known as: ANUSOL-HC Place 25 mg rectally daily as needed for hemorrhoids or anal itching.    lisinopril 10 MG tablet Commonly known as: ZESTRIL TAKE ONE TABLET BY MOUTH DAILY   pantoprazole 40 MG tablet Commonly known as: PROTONIX Take 1 tablet (40 mg total) by mouth daily.   pravastatin 40 MG tablet Commonly known as: PRAVACHOL TAKE ONE TABLET BY MOUTH DAILY   PreviDent 5000 Enamel Protect 1.1-5 % Pste Generic drug: Sod Fluoride-Potassium Nitrate See admin instructions.   Procto-Med HC 2.5 % rectal cream Generic drug: hydrocortisone APPLY 1 CREAM RECTALLY TWICE DAILY        Discharge Exam: Filed Weights   08/31/22 1745 09/02/22 1052  Weight: 63.6 kg 62.1 kg   General exam: Appears calm and comfortable Respiratory system: Respiratory effort normal. Central nervous system: Alert and oriented. No focal neurological deficits. Skin: Jaundice Psychiatry: Judgement and insight appear normal. Mood & affect appropriate.   Condition at discharge: stable  The results of significant diagnostics from this hospitalization (including imaging, microbiology, ancillary and laboratory) are listed below for reference.   Imaging Studies: DG ERCP  Result Date: 09/02/2022 CLINICAL DATA:  Biliary ductal dilatation. Possible malignant obstruction. EXAM: ERCP TECHNIQUE: Multiple spot images obtained with the fluoroscopic device and submitted for interpretation post-procedure. FLUOROSCOPY: Radiation Exposure Index (as provided by the fluoroscopic device): 346.03 mGy Kerma COMPARISON:  MRCP 09/01/2022 FINDINGS: A total of 26 saved intraoperative images are submitted for review. The images demonstrate a flexible duodenal scope in the descending duodenum. Initial wire catheterization of the main pancreatic duct with contrast injection. Ultimately, wire cannulation of the intrahepatic biliary tree is performed. Contrast injection demonstrates biliary ductal dilatation with occlusion distally. Subsequent images show sphincterotomy and placement of a plastic biliary stent. IMPRESSION: ERCP with  sphincterotomy and placement of a plastic biliary stent. Proximal and intrahepatic biliary ductal dilatation with high-grade stenosis of the mid and distal common duct. These images were submitted for radiologic interpretation only. Please see the procedural report for the amount of contrast and the fluoroscopy time utilized. Electronically Signed   By: Malachy Moan M.D.   On: 09/02/2022 12:31   DG C-Arm 1-60 Min-No Report  Result Date: 09/02/2022 Fluoroscopy was utilized by the requesting physician.  No radiographic interpretation.   MR ABDOMEN MRCP W WO CONTAST  Result Date: 09/01/2022 CLINICAL DATA:  Evaluate pancreatic mass identified by CT EXAM: MRI ABDOMEN WITHOUT AND WITH CONTRAST (INCLUDING MRCP) TECHNIQUE: Multiplanar multisequence MR imaging of the abdomen was performed both before and after the administration of intravenous contrast. Heavily T2-weighted images of the biliary and pancreatic ducts were obtained, and three-dimensional MRCP images were rendered by post processing. CONTRAST:  6mL GADAVIST GADOBUTROL 1 MMOL/ML IV SOLN COMPARISON:  CT abdomen pelvis, 08/31/2022 FINDINGS: Lower chest: No acute abnormality. Hepatobiliary: No solid liver abnormality is seen. Simple benign cyst of the posterior liver dome, requiring no specific further follow-up or characterization. No gallstones or gallbladder wall thickening. Mild gallbladder distention and adenomyomatosis of the fundus. Severe intrahepatic biliary ductal dilatation the common bile duct measuring up to 1.7 cm in caliber and abruptly truncated within the central pancreatic head (series 4, image 15). Pancreas: Small, ill-defined, subtly diffusion restricting and hypoenhancing mass in the inferior pancreatic head and uncinate, difficult to precisely delineate although measuring approximately 2.5 x 2.0 x 1.9 cm (series 21, image 30,  series 15, image 54). Abrupt truncation of the central pancreatic duct with severe ductal dilatation along  the length of the remaining pancreas, up to 1.5 cm in caliber, and severe atrophy of the distal pancreatic parenchyma. Spleen: Normal in size without significant abnormality. Adrenals/Urinary Tract: Adrenal glands are unremarkable. Benign hemorrhagic or proteinaceous cyst of the posterior midportion of the left kidney, requiring no further follow-up or characterization (series 5, image 16). Kidneys are otherwise normal, without obvious renal calculi, solid lesion, or hydronephrosis. Stomach/Bowel: Stomach is within normal limits. No evidence of bowel wall thickening, distention, or inflammatory changes. Large burden of stool in the colon. Vascular/Lymphatic: Aortic atherosclerosis. Mass most likely makes minimal contact to the posterior superior mesenteric vein near the confluence of the portal vein (e.g. Series 17, image 49), but appears to have preserved fat planes to the adjacent superior mesenteric artery and the portal vein proper. No enlarged abdominal lymph nodes. Other: No abdominal wall hernia or abnormality. No ascites. Musculoskeletal: No acute or significant osseous findings. IMPRESSION: 1. Small, ill-defined, subtly diffusion restricting and hypoenhancing mass in the inferior pancreatic head and uncinate, difficult to precisely delineate although measuring approximately 2.5 x 2.0 x 1.9 cm. Abrupt truncation of the central pancreatic duct with severe pancreatic ductal dilatation and atrophy of the pancreas distally. 2. Severe intra and extrahepatic biliary ductal dilatation, the common bile duct abruptly truncated within the central pancreatic head. 3. Findings consistent with pancreatic adenocarcinoma. 4. For purposes of local staging, mass most likely makes minimal contact to the posterior superior mesenteric vein near the confluence of the portal vein but appears to have preserved fat planes to the adjacent superior mesenteric artery and the portal vein proper. 5. No evidence of lymphadenopathy or  organ metastatic disease in the abdomen. 6. Large burden of stool in the colon. Aortic Atherosclerosis (ICD10-I70.0). Electronically Signed   By: Jearld Lesch M.D.   On: 09/01/2022 09:13   MR 3D Recon At Scanner  Result Date: 09/01/2022 CLINICAL DATA:  Evaluate pancreatic mass identified by CT EXAM: MRI ABDOMEN WITHOUT AND WITH CONTRAST (INCLUDING MRCP) TECHNIQUE: Multiplanar multisequence MR imaging of the abdomen was performed both before and after the administration of intravenous contrast. Heavily T2-weighted images of the biliary and pancreatic ducts were obtained, and three-dimensional MRCP images were rendered by post processing. CONTRAST:  6mL GADAVIST GADOBUTROL 1 MMOL/ML IV SOLN COMPARISON:  CT abdomen pelvis, 08/31/2022 FINDINGS: Lower chest: No acute abnormality. Hepatobiliary: No solid liver abnormality is seen. Simple benign cyst of the posterior liver dome, requiring no specific further follow-up or characterization. No gallstones or gallbladder wall thickening. Mild gallbladder distention and adenomyomatosis of the fundus. Severe intrahepatic biliary ductal dilatation the common bile duct measuring up to 1.7 cm in caliber and abruptly truncated within the central pancreatic head (series 4, image 15). Pancreas: Small, ill-defined, subtly diffusion restricting and hypoenhancing mass in the inferior pancreatic head and uncinate, difficult to precisely delineate although measuring approximately 2.5 x 2.0 x 1.9 cm (series 21, image 30, series 15, image 54). Abrupt truncation of the central pancreatic duct with severe ductal dilatation along the length of the remaining pancreas, up to 1.5 cm in caliber, and severe atrophy of the distal pancreatic parenchyma. Spleen: Normal in size without significant abnormality. Adrenals/Urinary Tract: Adrenal glands are unremarkable. Benign hemorrhagic or proteinaceous cyst of the posterior midportion of the left kidney, requiring no further follow-up or  characterization (series 5, image 16). Kidneys are otherwise normal, without obvious renal calculi, solid lesion, or hydronephrosis. Stomach/Bowel:  Stomach is within normal limits. No evidence of bowel wall thickening, distention, or inflammatory changes. Large burden of stool in the colon. Vascular/Lymphatic: Aortic atherosclerosis. Mass most likely makes minimal contact to the posterior superior mesenteric vein near the confluence of the portal vein (e.g. Series 17, image 49), but appears to have preserved fat planes to the adjacent superior mesenteric artery and the portal vein proper. No enlarged abdominal lymph nodes. Other: No abdominal wall hernia or abnormality. No ascites. Musculoskeletal: No acute or significant osseous findings. IMPRESSION: 1. Small, ill-defined, subtly diffusion restricting and hypoenhancing mass in the inferior pancreatic head and uncinate, difficult to precisely delineate although measuring approximately 2.5 x 2.0 x 1.9 cm. Abrupt truncation of the central pancreatic duct with severe pancreatic ductal dilatation and atrophy of the pancreas distally. 2. Severe intra and extrahepatic biliary ductal dilatation, the common bile duct abruptly truncated within the central pancreatic head. 3. Findings consistent with pancreatic adenocarcinoma. 4. For purposes of local staging, mass most likely makes minimal contact to the posterior superior mesenteric vein near the confluence of the portal vein but appears to have preserved fat planes to the adjacent superior mesenteric artery and the portal vein proper. 5. No evidence of lymphadenopathy or organ metastatic disease in the abdomen. 6. Large burden of stool in the colon. Aortic Atherosclerosis (ICD10-I70.0). Electronically Signed   By: Jearld Lesch M.D.   On: 09/01/2022 09:13   DG CHEST PORT 1 VIEW  Result Date: 08/31/2022 CLINICAL DATA:  10031 Cough 10031 EXAM: PORTABLE CHEST 1 VIEW.  Patient is rotated. COMPARISON:  Chest x-ray  08/22/2022 FINDINGS: The heart and mediastinal contours are unchanged. Aortic calcification. No focal consolidation. No pulmonary edema. No pleural effusion. No pneumothorax. No acute osseous abnormality. IMPRESSION: No active disease. Electronically Signed   By: Tish Frederickson M.D.   On: 08/31/2022 22:38   CT ABDOMEN PELVIS W CONTRAST  Result Date: 08/31/2022 CLINICAL DATA:  Unintentional weight loss of about 30 pounds in 3-4 months. Painless jaundice. EXAM: CT ABDOMEN AND PELVIS WITH CONTRAST TECHNIQUE: Multidetector CT imaging of the abdomen and pelvis was performed using the standard protocol following bolus administration of intravenous contrast. RADIATION DOSE REDUCTION: This exam was performed according to the departmental dose-optimization program which includes automated exposure control, adjustment of the mA and/or kV according to patient size and/or use of iterative reconstruction technique. CONTRAST:  OMNIPAQUE IOHEXOL 300 MG/ML  SOLN COMPARISON:  Overlapping portions CT chest 06/05/2013 FINDINGS: Lower chest: Atheromatous vascular calcification of the right coronary artery and descending thoracic aorta. Hepatobiliary: Prominent intrahepatic and extrahepatic biliary dilatation extending down to the distal common bile duct in the vicinity of a pancreatic head mass. No definite focal metastatic lesions to the liver. Posterior right hepatic lobe cyst 1.5 by 1.0 cm on image 16 of series 301, no follow up of this lesion is indicated. Mildly distended gallbladder. Pancreas: Severe dorsal pancreatic duct dilatation extending to a pancreatic head mass strongly favoring adenocarcinoma. Size: 3.0 by 1.8 by 2.6 cm Location: Pancreatic head Characterization: Solid Enhancement: Hypoenhancing on portal venous phase images. Other Characteristics: Severe dorsal pancreatic duct dilatation Local extent of mass: No invasion of the duodenum. Vascular Involvement: The mass minimally abuts the posterior margin of  the SMV without invasion. No involvement of the celiac trunk, common hepatic artery, gastroduodenal artery, or proper hepatic artery. Variant hepatic artery anatomy: Conventional Bile Duct Involvement: Marked narrowing of the distal CBD in the vicinity of the ampulla suggesting potential ampullary involvement by tumor. Variant biliary  anatomy: No Adjacent Nodes: Absent Omental/Peritoneal Disease: Absent Distant Metastases: Absent Spleen: Unremarkable Adrenals/Urinary Tract: Fullness of the adrenal glands without mass. 3.2 by 1.8 cm homogeneous hyperdense lesion of the left kidney posteriorly on image 27 series 301, internal density 50 Hounsfield units, likely a complex cyst given the homogeneity, but technically nonspecific. This and the pancreatic lesion could be further characterized with dedicated pancreatic protocol MRI with and without contrast. Stomach/Bowel: Sigmoid colon diverticulosis. Lower rectal and anal wall thickening circumferentially, likely from nondistention but technically nonspecific, correlate with colon cancer screening history. Vascular/Lymphatic: Atherosclerosis is present, including aortoiliac atherosclerotic disease. Substantial gastric collateral vessels. Dorsal atheromatous plaque at the origin of the celiac trunk and SMA without occlusion. There is also plaque further distally in the superior mesenteric artery. The IMA appears patent. No pathologic adenopathy observed. Reproductive: Unremarkable Other: No supplemental non-categorized findings. Musculoskeletal: Fatty right spermatic cord. Bilateral mild foraminal impingement at L5-S1 due to intervertebral and facet spurring. IMPRESSION: 1. 3 cm pancreatic head mass strongly favoring pancreatic adenocarcinoma. There is severe dorsal pancreatic duct dilatation and prominent intrahepatic and extrahepatic biliary dilatation extending down to the distal common bile duct in the vicinity of the pancreatic head mass. Other characteristics  detailed above. 2. 3.2 by 1.8 cm homogeneous hyperdense lesion of the left kidney posteriorly, internal density 50 Hounsfield units, likely a complex cyst given the homogeneity, but technically nonspecific. This and the pancreatic lesion could be further characterized with dedicated pancreatic protocol MRI with and without contrast. 3. Sigmoid colon diverticulosis. 4. Lower rectal and anal wall thickening circumferentially, likely from nondistention but technically nonspecific, correlate with colon cancer screening history. 5. Bilateral mild foraminal impingement at L5-S1 due to intervertebral and facet spurring. 6. Aortic atherosclerosis. Aortic Atherosclerosis (ICD10-I70.0). Electronically Signed   By: Gaylyn Rong M.D.   On: 08/31/2022 15:09   DG Chest 2 View  Result Date: 08/29/2022 CLINICAL DATA:  82 year old male with unintentional weight loss EXAM: CHEST - 2 VIEW COMPARISON:  02/01/2014 FINDINGS: Cardiomediastinal silhouette unchanged in size and contour. No evidence of central vascular congestion. No interlobular septal thickening. Bronchial wall thickening with reticulonodular opacities in the right mid and lower lung. No pneumothorax or pleural effusion. Coarsened interstitial markings, with no confluent airspace disease. No acute displaced fracture. Degenerative changes of the spine. IMPRESSION: Suspected bronchitis/atypical infection of the right lung. Followup PA and lateral chest X-ray is recommended in 3-4 weeks following therapy to assure resolution. Electronically Signed   By: Gilmer Mor D.O.   On: 08/29/2022 11:28    Microbiology: Results for orders placed or performed in visit on 06/04/17  Urine culture     Status: None   Collection Time: 06/04/17 12:48 PM   Specimen: Blood  Result Value Ref Range Status   MICRO NUMBER: [id number]  Final   SPECIMEN QUALITY: ADEQUATE  Final   Sample Source URINE, CLEAN CATCH  Final   STATUS: FINAL  Final   Result:   Final    Single organism  less than 10,000 CFU/mL isolated. These organisms, commonly found on external and internal genitalia, are considered colonizers. No further testing performed.   *Note: Due to a large number of results and/or encounters for the requested time period, some results have not been displayed. A complete set of results can be found in Results Review.    Labs: CBC: Recent Labs  Lab 08/30/22 1500 08/31/22 1801 09/01/22 0444  WBC 8.0 9.1 8.2  NEUTROABS 5.5  --   --   HGB 13.5 13.4  12.8*  HCT 39.1 37.8* 35.6*  MCV 93.1 93.8 93.0  PLT 171 182 159   Basic Metabolic Panel: Recent Labs  Lab 08/30/22 1500 08/31/22 1801 08/31/22 2330 09/01/22 0444 09/03/22 0030  NA 135 133*  --  136  --   K 4.8 4.4  --  3.6  --   CL 101 99  --  104  --   CO2 27 25  --  24  --   GLUCOSE 314* 274*  --  92 406*  BUN 15 15  --  14  --   CREATININE 1.45* 1.04  --  1.04  --   CALCIUM 9.5 9.4  --  9.2  --   MG  --   --  1.8 1.8  --   PHOS  --   --  3.3 3.4  --    Liver Function Tests: Recent Labs  Lab 08/30/22 1500 08/31/22 1801 09/01/22 0444  AST 176* 160* 175*  ALT 347* 352* 334*  ALKPHOS 440* 387* 395*  BILITOT 12.5* 13.5* 13.4*  PROT 6.2* 6.7 6.0*  ALBUMIN 4.1 3.6 3.1*   CBG: Recent Labs  Lab 09/01/22 0718 09/01/22 1126 09/02/22 2056 09/02/22 2354 09/03/22 0401  GLUCAP 130* 265* 245* 402* 94    Discharge time spent: 35 minutes.  Signed: Jacquelin Hawking, MD [facility] 09/03/2022

## 2022-09-03 NOTE — Discharge Instructions (Addendum)
Alexander [name],  Urine also because of some abnormal liver testing.  This appears to be secondary to a pancreatic mass which is concerning for pancreatic cancer.  The GI doctor saw you and placed a stent in your biliary system to relieve the blockages caused by this mass.  The GI doctor would like to follow up with you as an outpatient as he believes neck step for diagnosis is a procedure to get a sample of the mass.  Please follow-up with your primary care doctor as well.

## 2022-09-03 NOTE — Progress Notes (Signed)
Pt and wife given d/c instructions. All questions answered. Pt taken to front entrance via wheelchair to meet ride.

## 2022-09-03 NOTE — Progress Notes (Signed)
Pt stated he wanted to go AMA due to dinner order not being correct, as he was angry and upset about it. Notified Johann Capers, NP of this. The pt then shared that he would stay, that he has a friend that will bring him some dinner. He apologized for his "behavior".

## 2022-09-03 NOTE — Progress Notes (Signed)
Pt's blood sugar reading was 402 via fingerstick. Placed order for stat glucose level to be drawn by lab and notified Johann Capers, NP of this. He stated to inform him of what the glucose level is from blood draw.

## 2022-09-03 NOTE — Progress Notes (Signed)
HISTORY OF PRESENT ILLNESS:  Alexander [name] is a 82 y.o. male who was admitted to the hospital with painless jaundice.  Imaging revealed biliary ductal dilation in the head of pancreas lesion.  Repeat yesterday revealed a distal bile duct stricture with appearance, likely pancreatic in origin.  Was successfully stented with a 10 French 7 cm plastic stent.  No postprocedural problems.  Feeling well.  Eating well.  States the color of his stool and urine have improved.  Wife in the room lying in his bed.  Detailed questions that she wanted to share with physician friends and their daughter who is a pediatrician.  Liver tests are improving.  Did have elevated blood glucose.  Tells me that he takes oral diabetic medications sporadically.  He can monitor his blood sugar at home.  Anxious to go home  REVIEW OF SYSTEMS:  All non-GI ROS negative. Past Medical History:  Diagnosis Date   Arthritis    "minor; shoulders primarily" (05/20/2013)   CAD (coronary artery disease)    a. 05/2013 - Chest pain/unstable angina and dynamic EKG changes showing cath showing atherosclerotic coronary artery disease manifested as diffuse ectasia, normal EF.   Diverticulosis of colon    GERD (gastroesophageal reflux disease)    GI bleed    secondary to Aspirin   Gout    Hematuria, microscopic    Hemorrhoids    Hyperglycemia    Hyperlipidemia    Hypertension    Lumbar back pain    Microalbuminuria    Overweight(278.02)    Polycythemia rubra vera (HCC)    Tubular adenoma of colon 07/2012    Past Surgical History:  Procedure Laterality Date   CARDIAC CATHETERIZATION  05/20/2013   CATARACT EXTRACTION W/ INTRAOCULAR LENS  IMPLANT, BILATERAL Bilateral 2008   CYSTECTOMY  ~ 2000   " SEBACEOUS cyst removed from between shoulder blades"   EXCISIONAL HEMORRHOIDECTOMY  1970's   INGUINAL HERNIA REPAIR Right ~ 2010   LEFT HEART CATHETERIZATION WITH CORONARY ANGIOGRAM N/A 05/20/2013   Procedure: LEFT HEART  CATHETERIZATION WITH CORONARY ANGIOGRAM;  Surgeon: Peter M Swaziland, MD;  Location[facility];  Service: Cardiovascular;  Laterality: N/A;   TONSILLECTOMY AND ADENOIDECTOMY  1940's   VASECTOMY      Social History Alexander [name]  reports that he has never smoked. He has never used smokeless tobacco. He reports that he does not drink alcohol and does not use drugs.  family history includes Cancer in his maternal uncle; Colon cancer in his mother; Diabetes in his father and mother; Heart disease (age of onset: 23) in his father; Stomach cancer in his maternal grandfather.  Allergies  Allergen Reactions   Metformin And Related Diarrhea   Metformin Hcl Diarrhea       PHYSICAL EXAMINATION: Vital signs: BP (!) 146/67 (BP Location: Right Arm)   Pulse (!) 54   Temp 98 F (36.7 C) (Oral)   Resp 16   Ht 5\' 11"  (1.803 m)   Wt 62.1 kg   SpO2 100%   BMI 19.11 kg/m  General: Well-developed, well-nourished, no acute distress Skin: Jaundiced HEENT: Sclerae are icteric Lungs: Clear Heart: Regular Abdomen: soft, nontender, nondistended, no obvious ascites, no peritoneal signs, normal bowel sounds. No organomegaly. Extremities: No edema Psychiatric: alert and oriented x3. Cooperative   ASSESSMENT:  1.  Obstructive jaundice secondary to malignant appearing distal bile duct stricture, likely pancreas cancer.  Status post ERCP with biliary stent placement.  No problems post procedure.  Good stent  function with improving liver tests 2.  Blood sugars.  Taking oral medication at home sporadically.   PLAN:  1.  Okay for discharge from GI standpoint 2.  GI will arrange for the patient to have follow-up outpatient blood work and discomfort ultrasound 3.  I did recommend that he resume his oral diabetic medication regularly given the degree of hyperglycemia hospital.  As well, ask him to monitor his blood sugars regularly and reach out to his PCP Tuesday regarding this issue. 4.  Multiple  questions answered.  His procedure findings, treatment, impressions, and plans were reviewed.  In addition, I provided them with a complete copy of the report.  Will sign off  Fallyn Munnerlyn N. Eda Keys., M.D. [facility] Division of Gastroenterology

## 2022-09-04 ENCOUNTER — Encounter (HOSPITAL_COMMUNITY): Payer: Self-pay | Admitting: Internal Medicine

## 2022-09-04 ENCOUNTER — Telehealth: Payer: Self-pay

## 2022-09-04 NOTE — Transitions of Care (Post Inpatient/ED Visit) (Signed)
09/04/2022  Name: Alexander [name]: [id number] DOB: 05-23-1940  Today's TOC FU Call Status: Today's TOC FU Call Status:: Successful TOC FU Call Completed TOC FU Call Complete Date: 09/04/22 Patient's Name and Date of Birth confirmed.  Transition Care Management Follow-up Telephone Call Date of Discharge: 09/03/22 Discharge Facility: [facility]) Type of Discharge: Inpatient Admission Primary Inpatient Discharge Diagnosis:: obstruction of bile duct How have you been since you were released from the hospital?: Better Any questions or concerns?: No  Items Reviewed: Did you receive and understand the discharge instructions provided?: Yes Medications obtained,verified, and reconciled?: Yes (Medications Reviewed) Any new allergies since your discharge?: No Dietary orders reviewed?: Yes Do you have support at home?: Yes People in Home: spouse  Medications Reviewed Today: Medications Reviewed Today     Reviewed by Karena Addison, LPN (Licensed Practical Nurse) on 09/04/22 at 1115  Med List Status: <None>   Medication Order Taking? Sig Documenting Provider Last Dose Status Informant  Accu-Chek Softclix Lancets lancets [id number] No Use to test blood glucose once daily Nafziger, Kandee Keen, NP Taking Active Self  allopurinol (ZYLOPRIM) 100 MG tablet [id number] No TAKE ONE TABLET BY MOUTH DAILY Nafziger, Kandee Keen, NP 08/30/2022 Active Self  amLODipine (NORVASC) 10 MG tablet [id number] No TAKE ONE TABLET BY MOUTH DAILY Shirline Frees, NP 08/30/2022 Active Self  aspirin EC 81 MG tablet [id number] No Take 1 tablet (81 mg total) by mouth daily. Jake Bathe, MD 08/30/2022 Active Self  Blood Glucose Calibration (EMBRACE PRO GLUCOSE CONTROL) LIQD [id number] No Use to check blood glucose TID Shirline Frees, NP Taking Active Self  Blood Glucose Monitoring Suppl (ACCU-CHEK AVIVA PLUS) w/Device KIT [id number] No Use to test blood glucose once daily Nafziger, Kandee Keen, NP Taking Active Self  Coenzyme Q10 300  MG CAPS [id number] No Take 1 capsule by mouth daily.  [provider] 08/30/2022 Active Self  glipiZIDE (GLUCOTROL XL) 2.5 MG 24 hr tablet [id number] No Take 1 tablet (2.5 mg total) by mouth daily with breakfast. Shirline Frees, NP Past Week Active Self  glucose blood (EMBRACE PRO GLUCOSE TEST) test strip [id number] No Use as instructed Nafziger, Kandee Keen, NP Taking Active Self  hydrocortisone (ANUSOL-HC) 25 MG suppository [id number] No Place 25 mg rectally daily as needed for hemorrhoids or anal itching. [provider] unknown Active Self  lisinopril (ZESTRIL) 10 MG tablet [id number] No TAKE ONE TABLET BY MOUTH DAILY Nafziger, Cory, NP 08/30/2022 Active Self  pantoprazole (PROTONIX) 40 MG tablet [id number] No Take 1 tablet (40 mg total) by mouth daily. Shirline Frees, NP 08/30/2022 Active Self  pravastatin (PRAVACHOL) 40 MG tablet [id number] No TAKE ONE TABLET BY MOUTH DAILY Shirline Frees, NP 08/30/2022 Active Self  PREVIDENT 5000 ENAMEL PROTECT 1.1-5 % PSTE [id number] No See admin instructions. [provider] unknown Active Self  PROCTO-MED HC 2.5 % rectal cream [id number] No APPLY 1 CREAM RECTALLY TWICE DAILY Nafziger, Kandee Keen, NP unknown Active Self            Home Care and Equipment/Supplies: Were Home Health Services Ordered?: NA Any new equipment or medical supplies ordered?: NA  Functional Questionnaire: Do you need assistance with bathing/showering or dressing?: No Do you need assistance with meal preparation?: No Do you need assistance with eating?: No Do you have difficulty maintaining continence: No Do you need assistance with getting out of bed/getting out of a chair/moving?: No Do you have difficulty managing or taking your medications?: No  Follow up appointments reviewed: PCP Follow-up appointment confirmed?: Yes Date of PCP  follow-up appointment?: 09/05/22 Follow-up Provider: [facility] Follow-up appointment confirmed?: No Reason Specialist  Follow-Up Not Confirmed: Patient has Specialist Provider Number and will Call for Appointment Do you need transportation to your follow-up appointment?: No Do you understand care options if your condition(s) worsen?: Yes-patient verbalized understanding    SIGNATURE Karena Addison, LPN [facility] Nurse Health Advisor Direct Dial [phone]

## 2022-09-05 ENCOUNTER — Telehealth: Payer: Self-pay | Admitting: *Deleted

## 2022-09-05 ENCOUNTER — Encounter: Payer: Self-pay | Admitting: Adult Health

## 2022-09-05 ENCOUNTER — Other Ambulatory Visit: Payer: Self-pay

## 2022-09-05 ENCOUNTER — Encounter: Payer: Self-pay | Admitting: *Deleted

## 2022-09-05 ENCOUNTER — Ambulatory Visit: Payer: Medicare HMO | Admitting: Adult Health

## 2022-09-05 ENCOUNTER — Telehealth: Payer: Self-pay

## 2022-09-05 VITALS — BP 110/80 | HR 65 | Temp 97.9°F | Ht 71.0 in | Wt 138.0 lb

## 2022-09-05 DIAGNOSIS — E785 Hyperlipidemia, unspecified: Secondary | ICD-10-CM

## 2022-09-05 DIAGNOSIS — D45 Polycythemia vera: Secondary | ICD-10-CM

## 2022-09-05 DIAGNOSIS — E119 Type 2 diabetes mellitus without complications: Secondary | ICD-10-CM

## 2022-09-05 DIAGNOSIS — K8689 Other specified diseases of pancreas: Secondary | ICD-10-CM

## 2022-09-05 DIAGNOSIS — R7989 Other specified abnormal findings of blood chemistry: Secondary | ICD-10-CM

## 2022-09-05 DIAGNOSIS — R001 Bradycardia, unspecified: Secondary | ICD-10-CM

## 2022-09-05 DIAGNOSIS — K869 Disease of pancreas, unspecified: Secondary | ICD-10-CM

## 2022-09-05 DIAGNOSIS — R17 Unspecified jaundice: Secondary | ICD-10-CM | POA: Diagnosis not present

## 2022-09-05 DIAGNOSIS — Z7984 Long term (current) use of oral hypoglycemic drugs: Secondary | ICD-10-CM

## 2022-09-05 DIAGNOSIS — I10 Essential (primary) hypertension: Secondary | ICD-10-CM

## 2022-09-05 DIAGNOSIS — R748 Abnormal levels of other serum enzymes: Secondary | ICD-10-CM | POA: Diagnosis not present

## 2022-09-05 DIAGNOSIS — I2583 Coronary atherosclerosis due to lipid rich plaque: Secondary | ICD-10-CM

## 2022-09-05 DIAGNOSIS — K831 Obstruction of bile duct: Secondary | ICD-10-CM

## 2022-09-05 DIAGNOSIS — I2510 Atherosclerotic heart disease of native coronary artery without angina pectoris: Secondary | ICD-10-CM

## 2022-09-05 LAB — GLUCOSE, CAPILLARY
Glucose-Capillary: 102 mg/dL — ABNORMAL HIGH (ref 70–99)
Glucose-Capillary: 124 mg/dL — ABNORMAL HIGH (ref 70–99)
Glucose-Capillary: 157 mg/dL — ABNORMAL HIGH (ref 70–99)
Glucose-Capillary: 193 mg/dL — ABNORMAL HIGH (ref 70–99)
Glucose-Capillary: 202 mg/dL — ABNORMAL HIGH (ref 70–99)
Glucose-Capillary: 206 mg/dL — ABNORMAL HIGH (ref 70–99)
Glucose-Capillary: 226 mg/dL — ABNORMAL HIGH (ref 70–99)
Glucose-Capillary: 255 mg/dL — ABNORMAL HIGH (ref 70–99)
Glucose-Capillary: 98 mg/dL (ref 70–99)

## 2022-09-05 LAB — GLUCOSE, POCT (MANUAL RESULT ENTRY): POC Glucose: 233 mg/dL — AB (ref 70–99)

## 2022-09-05 MED ORDER — GLIPIZIDE ER 10 MG PO TB24: 10.0000 mg | ORAL_TABLET | Freq: Every day | ORAL | 1 refills | Status: DC

## 2022-09-05 NOTE — Progress Notes (Signed)
Patient is established in this office for secondary PV. He came into the office last week for a scheduled follow up when it was discovered that his bili and liver enzymes were exceptionally elevated. He also had about 35lb weight loss. Dr Myna Hidalgo ordered CT which showed a pancreatic head mass.   He was admitted to the hospital where he had an ERCP to help relieve the biliary obstruction. He was discharged over the weekend. He has follow up scheduled with GI but also needs to see a surgeon for consideration of resection.   EUS scheduled for 09/11/2022.  Referral order to [facility] placed. Referral coordinator notified.   Oncology Nurse Navigator Documentation     09/05/2022   10:15 AM  Oncology Nurse Navigator Flowsheets  Abnormal Finding Date 08/31/2022  Diagnosis Status Additional Work Up  Navigator Follow Up Date: 09/13/2022  Navigator Follow Up Reason: Pathology  Navigator Location [facility]  Navigator Encounter Type Appt/Treatment Plan Review  Patient Visit Type MedOnc  Treatment Phase Abnormal Scans;Abnormal Labs  Barriers/Navigation Needs Coordination of Care  Interventions Coordination of Care;Referrals  Acuity Level 2-Minimal Needs (1-2 Barriers Identified)  Referrals Other  Coordination of Care Other  Time Spent with Patient 30

## 2022-09-05 NOTE — Telephone Encounter (Signed)
Dr Meridee Score please review and advise for EUS-pt of Dr Russella Dar.

## 2022-09-05 NOTE — Telephone Encounter (Signed)
Patient called to schedule  °

## 2022-09-05 NOTE — Telephone Encounter (Signed)
Patty, Please schedule this patient for 9/9, the appointment slot that his actual wife was supposed to be doing a big polyp resection with me and but is canceled. Upper EUS FNB. Thanks. GM

## 2022-09-05 NOTE — Telephone Encounter (Signed)
EUS scheduled, pt instructed and medications reviewed.  Patient instructions mailed to home.  Patient to call with any questions or concerns.  

## 2022-09-05 NOTE — Telephone Encounter (Signed)
-----   Message from [name] sent at 09/03/2022 12:05 PM EDT ----- Regarding: Needs follow-up blood work and EUS. All,  This patient, known to Judie Petit, was admitted Friday with painless jaundice.  Imaging suggested head of pancreas lesion.  I performed ERCP yesterday.  He had a malignant appearing stricture of the distal bile duct which was stented with a plastic endoprosthesis.  Did well post procedure.  Feeling well today.  Liver tests are improving.  No brushings for technical reasons.  Daughter is a Development worker, community, lives elsewhere.  Needs: 1.  Follow-up liver test the end of this week or beginning of next.  They are aware.  Chaka Boyson please arrange 2.  Endoscopic ultrasound per Gabe.  They are aware.  Wife is apparently a patient of Gabe.  Gurtha Picker please arrange.  Thanks,  JP

## 2022-09-05 NOTE — Telephone Encounter (Signed)
Faxed referral to [facility] @ [phone]

## 2022-09-05 NOTE — Progress Notes (Addendum)
Subjective:    Patient ID: Alexander [name], male    DOB: Jul 17, 1940, 82 y.o.   MRN: [id number]  HPI 82 year old male who  has a past medical history of Arthritis, CAD (coronary artery disease), Diverticulosis of colon, GERD (gastroesophageal reflux disease), GI bleed, Gout, Hematuria, microscopic, Hemorrhoids, Hyperglycemia, Hyperlipidemia, Hypertension, Lumbar back pain, Microalbuminuria, Overweight(278.02), Polycythemia rubra vera (HCC), and Tubular adenoma of colon (07/2012).  He presents to the office today for TCM visit   Admit Date 08/31/2022 Discharge Date 09/03/2022  He presented to the emergency room secondary to abnormal imaging on CT abdomen pelvis significant for pancreatic head mass which is concerning for pancreatic adenocarcinoma, imaging also concerning for evidence of biliary obstruction.  Hospital Course  Biliary Obstruction/Painless Jaundice  -CT scan was significant for evidence of a 3 cm pancreatic head mass concerning for pancreatic adenocarcinoma with associated severe dorsal pancreatic duct dilation and prominent intrahepatic/extrahepatic biliary dilation down to the distal common bile duct.  GI performed an ERCP withwith sphincterotomy and biliary stent placement.  Appearing distal biliary stricture was noted with concern for pancreatic cancer.  Advised to follow-up outpatient GI  Pancreatic Mass -Diagnosis.  Concerning for pancreatic adenocarcinoma.  Patient to follow-up with GI for biopsy  Polycythemia vera -Sinew to follow outpatient with hematology  Hyperlipidemia  -Statin held secondary to elevated LFTs  Diabetes Mellitus Type 2  -A1c was 7.5 during hospital admission.  He did have significant hyperglycemia noted while inpatient.  He was resumed on home regimen of glipizide 2.5 mg extended release  Primary Hypertension  - Continue with Amlodipine  - He has been holding lisinopril due to low BP   Sinus Bradycardia  - Chronic  -Rates into the 30s bpm  during hospital admission likely related to anesthesia  CAD - Patient is on ASA and pravastatin as outpatient.    Today he reports that he is feeling well overall, feels as though his jaundice is improving.  He has been checking his blood sugars at home, this morning his blood sugar was 400.  He is also seeing readings in the 2-3 100s.  He has an appointment with gastroenterology next week. He has no had any fevers, chill, or abdominal pain or n/v/d.     Review of Systems  Constitutional: Negative.   HENT: Negative.    Eyes: Negative.   Respiratory: Negative.    Cardiovascular: Negative.   Gastrointestinal: Negative.   Endocrine: Negative.   Genitourinary: Negative.   Musculoskeletal: Negative.   Skin:  Positive for color change.  Allergic/Immunologic: Negative.   Neurological: Negative.   Hematological: Negative.   Psychiatric/Behavioral: Negative.    All other systems reviewed and are negative.    Past Medical History:  Diagnosis Date   Arthritis    "minor; shoulders primarily" (05/20/2013)   CAD (coronary artery disease)    a. 05/2013 - Chest pain/unstable angina and dynamic EKG changes showing cath showing atherosclerotic coronary artery disease manifested as diffuse ectasia, normal EF.   Diverticulosis of colon    GERD (gastroesophageal reflux disease)    GI bleed    secondary to Aspirin   Gout    Hematuria, microscopic    Hemorrhoids    Hyperglycemia    Hyperlipidemia    Hypertension    Lumbar back pain    Microalbuminuria    Overweight(278.02)    Polycythemia rubra vera (HCC)    Tubular adenoma of colon 07/2012    Social History   Socioeconomic History   Marital  status: Married    Spouse name: Not on file   Number of children: 3   Years of education: Not on file   Highest education level: Some college, no degree  Occupational History   Occupation: retired Garment/textile technologist: RETIRED  Tobacco Use   Smoking status: Never   Smokeless tobacco: Never    Tobacco comments:    NEVER USED TOBACCO  Vaping Use   Vaping status: Never Used  Substance and Sexual Activity   Alcohol use: No    Alcohol/week: 0.0 standard drinks of alcohol   Drug use: No   Sexual activity: Yes    Partners: Female  Other Topics Concern   Not on file  Social History Narrative   Marriedfor 52 years    Daughter, Physicist, medical (pediatrician--lives in [place], [place]), one in [place] - Warden/ranger. One in [place] - Chartered loss adjuster          Social Determinants of Health   Financial Resource Strain: Low Risk  (06/11/2022)   Overall Financial Resource Strain (CARDIA)    Difficulty of Paying Living Expenses: Not hard at all  Food Insecurity: No Food Insecurity (09/03/2022)   Hunger Vital Sign    Worried About Running Out of Food in the Last Year: Never true    Ran Out of Food in the Last Year: Never true  Transportation Needs: No Transportation Needs (09/03/2022)   PRAPARE - Administrator, Civil Service (Medical): No    Lack of Transportation (Non-Medical): No  Physical Activity: Sufficiently Active (06/11/2022)   Exercise Vital Sign    Days of Exercise per Week: 7 days    Minutes of Exercise per Session: 90 min  Stress: No Stress Concern Present (06/11/2022)   [employer] of Occupational Health - Occupational Stress Questionnaire    Feeling of Stress : Not at all  Social Connections: Socially Integrated (06/11/2022)   Social Connection and Isolation Panel [NHANES]    Frequency of Communication with Friends and Family: More than three times a week    Frequency of Social Gatherings with Friends and Family: Twice a week    Attends Religious Services: More than 4 times per year    Active Member of [employer] or Organizations: Yes    Attends Banker Meetings: More than 4 times per year    Marital Status: Married  Catering manager Violence: Not At Risk (09/03/2022)   Humiliation, Afraid, Rape, and Kick questionnaire    Fear of Current or Ex-Partner: No    Emotionally  Abused: No    Physically Abused: No    Sexually Abused: No    Past Surgical History:  Procedure Laterality Date   BILIARY STENT PLACEMENT N/A 09/02/2022   Procedure: BILIARY STENT PLACEMENT;  Surgeon: Hilarie Fredrickson, MD;  Location: [facility];  Service: Gastroenterology;  Laterality: N/A;   CARDIAC CATHETERIZATION  05/20/2013   CATARACT EXTRACTION W/ INTRAOCULAR LENS  IMPLANT, BILATERAL Bilateral 2008   CYSTECTOMY  ~ 2000   " SEBACEOUS cyst removed from between shoulder blades"   ENDOSCOPIC RETROGRADE CHOLANGIOPANCREATOGRAPHY (ERCP) WITH PROPOFOL N/A 09/02/2022   Procedure: ENDOSCOPIC RETROGRADE CHOLANGIOPANCREATOGRAPHY (ERCP) WITH PROPOFOL;  Surgeon: Hilarie Fredrickson, MD;  Location: [facility];  Service: Gastroenterology;  Laterality: N/A;   EXCISIONAL HEMORRHOIDECTOMY  1970's   INGUINAL HERNIA REPAIR Right ~ 2010   LEFT HEART CATHETERIZATION WITH CORONARY ANGIOGRAM N/A 05/20/2013   Procedure: LEFT HEART CATHETERIZATION WITH CORONARY ANGIOGRAM;  Surgeon: Peter M Swaziland, MD;  Location: [facility] CATH LAB;  Service: Cardiovascular;  Laterality: N/A;   SPHINCTEROTOMY  09/02/2022   Procedure: SPHINCTEROTOMY;  Surgeon: Hilarie Fredrickson, MD;  Location: [facility];  Service: Gastroenterology;;   TONSILLECTOMY AND ADENOIDECTOMY  1940's   VASECTOMY      Family History  Problem Relation Age of Onset   Colon cancer Mother        died at 45, colorectal   Diabetes Mother    Heart disease Father 55       MI   Diabetes Father    Stomach cancer Maternal Grandfather    Cancer Maternal Uncle     Allergies  Allergen Reactions   Metformin And Related Diarrhea   Metformin Hcl Diarrhea    Current Outpatient Medications on File Prior to Visit  Medication Sig Dispense Refill   Accu-Chek Softclix Lancets lancets Use to test blood glucose once daily 100 each 3   allopurinol (ZYLOPRIM) 100 MG tablet TAKE ONE TABLET BY MOUTH DAILY 365 tablet 0   amLODipine (NORVASC) 10 MG tablet TAKE ONE TABLET BY MOUTH DAILY  90 tablet 3   aspirin EC 81 MG tablet Take 1 tablet (81 mg total) by mouth daily. 90 tablet 3   Blood Glucose Calibration (EMBRACE PRO GLUCOSE CONTROL) LIQD Use to check blood glucose TID 1 each    Blood Glucose Monitoring Suppl (ACCU-CHEK AVIVA PLUS) w/Device KIT Use to test blood glucose once daily 1 kit 0   Coenzyme Q10 300 MG CAPS Take 1 capsule by mouth daily.      glipiZIDE (GLUCOTROL XL) 2.5 MG 24 hr tablet Take 1 tablet (2.5 mg total) by mouth daily with breakfast. 90 tablet 3   glucose blood (EMBRACE PRO GLUCOSE TEST) test strip Use as instructed 100 each 12   hydrocortisone (ANUSOL-HC) 25 MG suppository Place 25 mg rectally daily as needed for hemorrhoids or anal itching.     lisinopril (ZESTRIL) 10 MG tablet TAKE ONE TABLET BY MOUTH DAILY 90 tablet 3   pantoprazole (PROTONIX) 40 MG tablet Take 1 tablet (40 mg total) by mouth daily. 365 tablet 0   pravastatin (PRAVACHOL) 40 MG tablet TAKE ONE TABLET BY MOUTH DAILY 365 tablet 0   PREVIDENT 5000 ENAMEL PROTECT 1.1-5 % PSTE See admin instructions.     PROCTO-MED HC 2.5 % rectal cream APPLY 1 CREAM RECTALLY TWICE DAILY 90 g 3   No current facility-administered medications on file prior to visit.    BP 110/80   Pulse 65   Temp 97.9 F (36.6 C) (Oral)   Ht 5\' 11"  (1.803 m)   Wt 138 lb (62.6 kg)   SpO2 99%   BMI 19.25 kg/m       Objective:   Physical Exam Vitals and nursing note reviewed.  Constitutional:      Appearance: Normal appearance.  Cardiovascular:     Rate and Rhythm: Normal rate and regular rhythm.     Pulses: Normal pulses.     Heart sounds: Normal heart sounds.  Pulmonary:     Effort: Pulmonary effort is normal.     Breath sounds: Normal breath sounds.  Abdominal:     General: Abdomen is flat. Bowel sounds are normal.     Palpations: Abdomen is soft.  Skin:    General: Skin is warm and dry.     Capillary Refill: Capillary refill takes less than 2 seconds.     Coloration: Skin is jaundiced.   Neurological:     General: No focal deficit present.  Mental Status: He is alert and oriented to person, place, and time.  Psychiatric:        Mood and Affect: Mood normal.        Behavior: Behavior normal.        Thought Content: Thought content normal.        Judgment: Judgment normal.       Assessment & Plan:  1. Elevated liver enzymes -Viewed hospital notes, discharge instructions, labs, and imaging with the patient and his wife.  All questions answered to the best of my ability.  Will recheck CMP today - Comprehensive metabolic panel; Future - Comprehensive metabolic panel  2. Biliary obstruction  - Comprehensive metabolic panel; Future - Comprehensive metabolic panel  3. Jaundice - Improving  - Comprehensive metabolic panel; Future - Comprehensive metabolic panel  4. Mass of head of pancreas - Per GI  - Comprehensive metabolic panel; Future - Comprehensive metabolic panel  5. Polycythemia vera (HCC) - Per Hematology  - Comprehensive metabolic panel; Future - Comprehensive metabolic panel  6. Hyperlipidemia, unspecified hyperlipidemia type - Continue to hold statin  - Comprehensive metabolic panel; Future - Comprehensive metabolic panel  7. Diabetes mellitus treated with oral medication (HCC) - Will increase Glipizide to 210 mg ER daily.  - POC Glucose (CBG)- 233 - in office  - Comprehensive metabolic panel; Future - Comprehensive metabolic panel  8. Primary hypertension - Controlled on Norvasc. Continue to hold lisinopril  - Comprehensive metabolic panel; Future - Comprehensive metabolic panel  9. Sinus bradycardia - Back to baseline  - Comprehensive metabolic panel; Future - Comprehensive metabolic panel  10. Coronary artery disease due to lipid rich plaque - Hold ASA until after biopsy  - Continue to hold statin  - Comprehensive metabolic panel; Future - Comprehensive metabolic panel   Shirline Frees, NP

## 2022-09-05 NOTE — Telephone Encounter (Signed)
Referral faxed to [facility] [phone]

## 2022-09-05 NOTE — Telephone Encounter (Signed)
EUS has been scheduled for 09/11/22 at 930 am at [facility] with GM

## 2022-09-06 ENCOUNTER — Encounter: Payer: Self-pay | Admitting: Adult Health

## 2022-09-06 ENCOUNTER — Encounter: Payer: Self-pay | Admitting: Hematology & Oncology

## 2022-09-06 LAB — COMPREHENSIVE METABOLIC PANEL
ALT: 178 U/L — ABNORMAL HIGH (ref 0–53)
AST: 49 U/L — ABNORMAL HIGH (ref 0–37)
Albumin: 3.8 g/dL (ref 3.5–5.2)
Alkaline Phosphatase: 321 U/L — ABNORMAL HIGH (ref 39–117)
BUN: 19 mg/dL (ref 6–23)
CO2: 27 meq/L (ref 19–32)
Calcium: 9.2 mg/dL (ref 8.4–10.5)
Chloride: 100 meq/L (ref 96–112)
Creatinine, Ser: 1.19 mg/dL (ref 0.40–1.50)
GFR: 56.87 mL/min — ABNORMAL LOW (ref >60.00)
Glucose, Bld: 191 mg/dL — ABNORMAL HIGH (ref 70–99)
Potassium: 3.7 meq/L (ref 3.5–5.1)
Sodium: 136 meq/L (ref 135–145)
Total Bilirubin: 6.3 mg/dL — ABNORMAL HIGH (ref 0.2–1.2)
Total Protein: 6.7 g/dL (ref 6.0–8.3)

## 2022-09-06 NOTE — Telephone Encounter (Signed)
Please advise 

## 2022-09-07 ENCOUNTER — Inpatient Hospital Stay: Payer: Medicare HMO | Attending: Hematology & Oncology

## 2022-09-07 ENCOUNTER — Encounter: Payer: Self-pay | Admitting: Adult Health

## 2022-09-07 ENCOUNTER — Encounter (HOSPITAL_COMMUNITY): Payer: Self-pay | Admitting: Gastroenterology

## 2022-09-07 DIAGNOSIS — C250 Malignant neoplasm of head of pancreas: Secondary | ICD-10-CM | POA: Diagnosis present

## 2022-09-07 DIAGNOSIS — Z79899 Other long term (current) drug therapy: Secondary | ICD-10-CM | POA: Diagnosis not present

## 2022-09-07 DIAGNOSIS — E1165 Type 2 diabetes mellitus with hyperglycemia: Secondary | ICD-10-CM | POA: Diagnosis not present

## 2022-09-07 DIAGNOSIS — D45 Polycythemia vera: Secondary | ICD-10-CM | POA: Diagnosis not present

## 2022-09-07 LAB — CMP (CANCER CENTER ONLY)
ALT: 112 U/L — ABNORMAL HIGH (ref 0–44)
AST: 31 U/L (ref 15–41)
Albumin: 4.1 g/dL (ref 3.5–5.0)
Alkaline Phosphatase: 282 U/L — ABNORMAL HIGH (ref 38–126)
Anion gap: 7 (ref 5–15)
BUN: 15 mg/dL (ref 8–23)
CO2: 29 mmol/L (ref 22–32)
Calcium: 9.5 mg/dL (ref 8.9–10.3)
Chloride: 103 mmol/L (ref 98–111)
Creatinine: 1.22 mg/dL (ref 0.61–1.24)
GFR, Estimated: 59 mL/min — ABNORMAL LOW (ref >60)
Glucose, Bld: 238 mg/dL — ABNORMAL HIGH (ref 70–99)
Potassium: 3.9 mmol/L (ref 3.5–5.1)
Sodium: 139 mmol/L (ref 135–145)
Total Bilirubin: 4.6 mg/dL (ref 0.3–1.2)
Total Protein: 6.7 g/dL (ref 6.5–8.1)

## 2022-09-08 NOTE — Progress Notes (Signed)
Alexander [name]  PCP-Cory Nafziger NP   Cardiologist- Donato Schultz MD  EKG-09/02/22 [phone] Cath- n/a Stress-2014 ICD/PM-n/a Blood thinner-n/a GLP-1-n/a  Hx: CAD, HTN, DM, Polycythemia ruba vera. Sees oncology for polycythemia, last visit 8/28 saw he was jaundiced and bilirubin/lfts were elevated so they recommended he go to ED, was worried with other symptoms could be pancreatic issue. Went in and admitted him, they ended up doing an ERCP 8/31 with biliary stenting but was like malignant stricture so now he is set up for outpt EUS. Of note while he was in ED they did a chest xray which showed bronchitis/ unspecified infection so they put him on doxycyline for two days but then another chest xray 8/29 and did not show anything. Patient stating not having any respiratory symptoms at all, no cough/fevers.  Anesthesia Review: Yes

## 2022-09-08 NOTE — Anesthesia Preprocedure Evaluation (Signed)
Anesthesia Evaluation  Patient identified by MRN, date of birth, ID band Patient awake    Reviewed: Allergy & Precautions, H&P , NPO status , Patient's Chart, lab work & pertinent test results  Airway Mallampati: II  TM Distance: >3 FB Neck ROM: Full    Dental no notable dental hx.    Pulmonary neg pulmonary ROS   Pulmonary exam normal breath sounds clear to auscultation       Cardiovascular hypertension, Pt. on medications Normal cardiovascular exam Rhythm:Regular Rate:Normal     Neuro/Psych negative neurological ROS  negative psych ROS   GI/Hepatic negative GI ROS, Neg liver ROS,,,  Endo/Other  diabetes, Type 2    Renal/GU negative Renal ROS  negative genitourinary   Musculoskeletal negative musculoskeletal ROS (+)    Abdominal   Peds negative pediatric ROS (+)  Hematology negative hematology ROS (+)   Anesthesia Other Findings   Reproductive/Obstetrics negative OB ROS                             Anesthesia Physical Anesthesia Plan  ASA: 2  Anesthesia Plan: MAC   Post-op Pain Management: Minimal or no pain anticipated   Induction: Intravenous  PONV Risk Score and Plan: 1 and Propofol infusion and Treatment may vary due to age or medical condition  Airway Management Planned: Simple Face Mask  Additional Equipment:   Intra-op Plan:   Post-operative Plan:   Informed Consent: I have reviewed the patients History and Physical, chart, labs and discussed the procedure including the risks, benefits and alternatives for the proposed anesthesia with the patient or authorized representative who has indicated his/her understanding and acceptance.     Dental advisory given  Plan Discussed with: CRNA and Surgeon  Anesthesia Plan Comments: (Patient with marked sinus bradycardia after anesthesia from ERCP on 8/31. Resolved)        Anesthesia Quick Evaluation

## 2022-09-11 ENCOUNTER — Encounter (HOSPITAL_COMMUNITY): Payer: Self-pay | Admitting: Gastroenterology

## 2022-09-11 ENCOUNTER — Encounter (HOSPITAL_COMMUNITY)
Admission: RE | Disposition: A | Discharge status: Home / Self Care | Payer: Self-pay | Source: Ambulatory Visit | Attending: Gastroenterology

## 2022-09-11 ENCOUNTER — Ambulatory Visit (HOSPITAL_BASED_OUTPATIENT_CLINIC_OR_DEPARTMENT_OTHER): Payer: Medicare HMO | Admitting: Medical

## 2022-09-11 ENCOUNTER — Ambulatory Visit (HOSPITAL_COMMUNITY): Payer: Medicare HMO | Admitting: Medical

## 2022-09-11 ENCOUNTER — Encounter: Payer: Self-pay | Admitting: *Deleted

## 2022-09-11 ENCOUNTER — Inpatient Hospital Stay: Payer: Medicare HMO

## 2022-09-11 ENCOUNTER — Ambulatory Visit: Payer: Medicare HMO | Admitting: Hematology & Oncology

## 2022-09-11 ENCOUNTER — Ambulatory Visit (HOSPITAL_COMMUNITY)
Admission: RE | Admit: 2022-09-11 | Discharge: 2022-09-11 | Disposition: A | Discharge status: Home / Self Care | Payer: Medicare HMO | Source: Ambulatory Visit | Attending: Gastroenterology | Admitting: Gastroenterology

## 2022-09-11 DIAGNOSIS — K2970 Gastritis, unspecified, without bleeding: Secondary | ICD-10-CM | POA: Diagnosis not present

## 2022-09-11 DIAGNOSIS — I10 Essential (primary) hypertension: Secondary | ICD-10-CM | POA: Insufficient documentation

## 2022-09-11 DIAGNOSIS — K8689 Other specified diseases of pancreas: Secondary | ICD-10-CM | POA: Diagnosis not present

## 2022-09-11 DIAGNOSIS — K831 Obstruction of bile duct: Secondary | ICD-10-CM | POA: Diagnosis not present

## 2022-09-11 DIAGNOSIS — K449 Diaphragmatic hernia without obstruction or gangrene: Secondary | ICD-10-CM

## 2022-09-11 DIAGNOSIS — R590 Localized enlarged lymph nodes: Secondary | ICD-10-CM | POA: Diagnosis not present

## 2022-09-11 DIAGNOSIS — E119 Type 2 diabetes mellitus without complications: Secondary | ICD-10-CM | POA: Diagnosis not present

## 2022-09-11 DIAGNOSIS — K259 Gastric ulcer, unspecified as acute or chronic, without hemorrhage or perforation: Secondary | ICD-10-CM | POA: Diagnosis not present

## 2022-09-11 DIAGNOSIS — K2950 Unspecified chronic gastritis without bleeding: Secondary | ICD-10-CM | POA: Insufficient documentation

## 2022-09-11 DIAGNOSIS — C250 Malignant neoplasm of head of pancreas: Secondary | ICD-10-CM | POA: Insufficient documentation

## 2022-09-11 DIAGNOSIS — K219 Gastro-esophageal reflux disease without esophagitis: Secondary | ICD-10-CM

## 2022-09-11 DIAGNOSIS — K869 Disease of pancreas, unspecified: Secondary | ICD-10-CM

## 2022-09-11 HISTORY — PX: ESOPHAGOGASTRODUODENOSCOPY: SHX5428

## 2022-09-11 HISTORY — PX: FINE NEEDLE ASPIRATION: SHX5430

## 2022-09-11 HISTORY — PX: BIOPSY: SHX5522

## 2022-09-11 HISTORY — PX: EUS: SHX5427

## 2022-09-11 LAB — GLUCOSE, CAPILLARY: Glucose-Capillary: 168 mg/dL — ABNORMAL HIGH (ref 70–99)

## 2022-09-11 SURGERY — UPPER ENDOSCOPIC ULTRASOUND (EUS) RADIAL
Anesthesia: Monitor Anesthesia Care

## 2022-09-11 MED ORDER — PROPOFOL 500 MG/50ML IV EMUL
INTRAVENOUS | Status: DC | PRN
  Administered 2022-09-11: 50 ug/kg/min via INTRAVENOUS

## 2022-09-11 MED ORDER — SODIUM CHLORIDE 0.9 % IV SOLN: INTRAVENOUS | Status: DC

## 2022-09-11 MED ORDER — PROPOFOL 10 MG/ML IV BOLUS
INTRAVENOUS | Status: DC | PRN
  Administered 2022-09-11: 10 mg via INTRAVENOUS
  Administered 2022-09-11: 20 mg via INTRAVENOUS
  Administered 2022-09-11: 30 mg via INTRAVENOUS
  Administered 2022-09-11 (×6): 20 mg via INTRAVENOUS
  Administered 2022-09-11: 30 mg via INTRAVENOUS

## 2022-09-11 MED ORDER — GLYCOPYRROLATE 0.2 MG/ML IJ SOLN
INTRAMUSCULAR | Status: DC | PRN
  Administered 2022-09-11: .2 mg via INTRAVENOUS

## 2022-09-11 MED ORDER — LACTATED RINGERS IV SOLN: INTRAVENOUS | Status: DC | PRN

## 2022-09-11 MED ORDER — PROPOFOL 1000 MG/100ML IV EMUL
INTRAVENOUS | Status: AC
  Filled 2022-09-11: qty 100

## 2022-09-11 MED ORDER — LIDOCAINE HCL (CARDIAC) PF 100 MG/5ML IV SOSY
PREFILLED_SYRINGE | INTRAVENOUS | Status: DC | PRN
  Administered 2022-09-11: 50 mg via INTRAVENOUS

## 2022-09-11 MED ORDER — PANTOPRAZOLE SODIUM 40 MG PO TBEC: 40.0000 mg | DELAYED_RELEASE_TABLET | Freq: Two times a day (BID) | ORAL | 6 refills | Status: DC

## 2022-09-11 NOTE — Transfer of Care (Signed)
Immediate Anesthesia Transfer of Care Note  Patient: Alexander [name]  Procedure(s) Performed: UPPER ENDOSCOPIC ULTRASOUND (EUS) RADIAL ESOPHAGOGASTRODUODENOSCOPY (EGD) BIOPSY FINE NEEDLE ASPIRATION (FNA) LINEAR  Patient Location: PACU  Anesthesia Type:MAC  Level of Consciousness: drowsy  Airway & Oxygen Therapy: Patient Spontanous Breathing  Post-op Assessment: Report given to RN and Post -op Vital signs reviewed and stable  Post vital signs: Reviewed and stable  Last Vitals:  Vitals Value Taken Time  BP 148/70 09/11/22 0949  Temp    Pulse 62 09/11/22 0949  Resp 16 09/11/22 0949  SpO2 100 % 09/11/22 0949    Last Pain:  Vitals:   09/11/22 0949  TempSrc:   PainSc: Asleep         Complications: No notable events documented.

## 2022-09-11 NOTE — H&P (Signed)
GASTROENTEROLOGY PROCEDURE H&P NOTE   Primary Care Physician: Shirline Frees, NP  HPI: Alexander [name] is a 82 y.o. male who presents for EGD/EUS to evaluate a pancreatic lesion that is believed to be source of biliary obstruction s/p recent ERCP.  Past Medical History:  Diagnosis Date   Arthritis    "minor; shoulders primarily" (05/20/2013)   CAD (coronary artery disease)    a. 05/2013 - Chest pain/unstable angina and dynamic EKG changes showing cath showing atherosclerotic coronary artery disease manifested as diffuse ectasia, normal EF.   Diverticulosis of colon    GERD (gastroesophageal reflux disease)    GI bleed    secondary to Aspirin   Gout    Hematuria, microscopic    Hemorrhoids    Hyperglycemia    Hyperlipidemia    Hypertension    Lumbar back pain    Microalbuminuria    Overweight(278.02)    Polycythemia rubra vera (HCC)    Tubular adenoma of colon 07/2012   Past Surgical History:  Procedure Laterality Date   BILIARY STENT PLACEMENT N/A 09/02/2022   Procedure: BILIARY STENT PLACEMENT;  Surgeon: Hilarie Fredrickson, MD;  Location: [facility];  Service: Gastroenterology;  Laterality: N/A;   CARDIAC CATHETERIZATION  05/20/2013   CATARACT EXTRACTION W/ INTRAOCULAR LENS  IMPLANT, BILATERAL Bilateral 2008   CYSTECTOMY  ~ 2000   " SEBACEOUS cyst removed from between shoulder blades"   ENDOSCOPIC RETROGRADE CHOLANGIOPANCREATOGRAPHY (ERCP) WITH PROPOFOL N/A 09/02/2022   Procedure: ENDOSCOPIC RETROGRADE CHOLANGIOPANCREATOGRAPHY (ERCP) WITH PROPOFOL;  Surgeon: Hilarie Fredrickson, MD;  Location: [facility];  Service: Gastroenterology;  Laterality: N/A;   EXCISIONAL HEMORRHOIDECTOMY  1970's   INGUINAL HERNIA REPAIR Right ~ 2010   LEFT HEART CATHETERIZATION WITH CORONARY ANGIOGRAM N/A 05/20/2013   Procedure: LEFT HEART CATHETERIZATION WITH CORONARY ANGIOGRAM;  Surgeon: Peter M Swaziland, MD;  Location[facility];  Service: Cardiovascular;  Laterality: N/A;   SPHINCTEROTOMY   09/02/2022   Procedure: SPHINCTEROTOMY;  Surgeon: Hilarie Fredrickson, MD;  Location: [facility];  Service: Gastroenterology;;   TONSILLECTOMY AND ADENOIDECTOMY  1940's   VASECTOMY     Current Facility-Administered Medications  Medication Dose Route Frequency Provider Last Rate Last Admin   0.9 %  sodium chloride infusion   Intravenous Continuous Mansouraty, Netty Starring., MD        Current Facility-Administered Medications:    0.9 %  sodium chloride infusion, , Intravenous, Continuous, Mansouraty, Netty Starring., MD Allergies  Allergen Reactions   Metformin And Related Diarrhea   Metformin Hcl Diarrhea   Family History  Problem Relation Age of Onset   Colon cancer Mother        died at 71, colorectal   Diabetes Mother    Heart disease Father 51       MI   Diabetes Father    Stomach cancer Maternal Grandfather    Cancer Maternal Uncle    Social History   Socioeconomic History   Marital status: Married    Spouse name: Not on file   Number of children: 3   Years of education: Not on file   Highest education level: Some college, no degree  Occupational History   Occupation: retired Garment/textile technologist: RETIRED  Tobacco Use   Smoking status: Never   Smokeless tobacco: Never   Tobacco comments:    NEVER USED TOBACCO  Vaping Use   Vaping status: Never Used  Substance and Sexual Activity   Alcohol use: No    Alcohol/week: 0.0 standard  drinks of alcohol   Drug use: No   Sexual activity: Yes    Partners: Female  Other Topics Concern   Not on file  Social History Narrative   Marriedfor 52 years    Daughter, Physicist, medical (pediatrician--lives in [place], [place]), one in [place] - Warden/ranger. One in [place] - Chartered loss adjuster          Social Determinants of Health   Financial Resource Strain: Low Risk  (06/11/2022)   Overall Financial Resource Strain (CARDIA)    Difficulty of Paying Living Expenses: Not hard at all  Food Insecurity: No Food Insecurity (09/03/2022)   Hunger Vital Sign    Worried  About Running Out of Food in the Last Year: Never true    Ran Out of Food in the Last Year: Never true  Transportation Needs: No Transportation Needs (09/03/2022)   PRAPARE - Administrator, Civil Service (Medical): No    Lack of Transportation (Non-Medical): No  Physical Activity: Sufficiently Active (06/11/2022)   Exercise Vital Sign    Days of Exercise per Week: 7 days    Minutes of Exercise per Session: 90 min  Stress: No Stress Concern Present (06/11/2022)   [employer] of Occupational Health - Occupational Stress Questionnaire    Feeling of Stress : Not at all  Social Connections: Socially Integrated (06/11/2022)   Social Connection and Isolation Panel [NHANES]    Frequency of Communication with Friends and Family: More than three times a week    Frequency of Social Gatherings with Friends and Family: Twice a week    Attends Religious Services: More than 4 times per year    Active Member of [employer] or Organizations: Yes    Attends Engineer, structural: More than 4 times per year    Marital Status: Married  Catering manager Violence: Not At Risk (09/03/2022)   Humiliation, Afraid, Rape, and Kick questionnaire    Fear of Current or Ex-Partner: No    Emotionally Abused: No    Physically Abused: No    Sexually Abused: No    Physical Exam: There were no vitals filed for this visit. There is no height or weight on file to calculate BMI. GEN: NAD EYE: Sclerae anicteric ENT: MMM CV: Non-tachycardic GI: Soft, NT/ND NEURO:  Alert & Oriented x 3  Lab Results: No results for input(s): "WBC", "HGB", "HCT", "PLT" in the last 72 hours. BMET No results for input(s): "NA", "K", "CL", "CO2", "GLUCOSE", "BUN", "CREATININE", "CALCIUM" in the last 72 hours. LFT No results for input(s): "PROT", "ALBUMIN", "AST", "ALT", "ALKPHOS", "BILITOT", "BILIDIR", "IBILI" in the last 72 hours. PT/INR No results for input(s): "LABPROT", "INR" in the last 72 hours.   Impression /  Plan: This is a 82 y.o.male who presents for EGD/EUS to evaluate a pancreatic lesion that is believed to be source of biliary obstruction s/p recent ERCP.  The risks of an EUS including intestinal perforation, bleeding, infection, aspiration, and medication effects were discussed as was the possibility it may not give a definitive diagnosis if a biopsy is performed.  When a biopsy of the pancreas is done as part of the EUS, there is an additional risk of pancreatitis at the rate of about 1-2%.  It was explained that procedure related pancreatitis is typically mild, although it can be severe and even life threatening, which is why we do not perform random pancreatic biopsies and only biopsy a lesion/area we feel is concerning enough to warrant the risk.  The risks and  benefits of endoscopic evaluation/treatment were discussed with the patient and/or family; these include but are not limited to the risk of perforation, infection, bleeding, missed lesions, lack of diagnosis, severe illness requiring hospitalization, as well as anesthesia and sedation related illnesses.  The patient's history has been reviewed, patient examined, no change in status, and deemed stable for procedure.  The patient and/or family is agreeable to proceed.    Corliss Parish, MD [facility] Office # [phone]

## 2022-09-11 NOTE — Anesthesia Postprocedure Evaluation (Signed)
Anesthesia Post Note  Patient: [name]  Procedure(s) Performed: UPPER ENDOSCOPIC ULTRASOUND (EUS) RADIAL ESOPHAGOGASTRODUODENOSCOPY (EGD) BIOPSY FINE NEEDLE ASPIRATION (FNA) LINEAR     Patient location during evaluation: PACU Anesthesia Type: MAC Level of consciousness: awake and alert Pain management: pain level controlled Vital Signs Assessment: post-procedure vital signs reviewed and stable Respiratory status: spontaneous breathing, nonlabored ventilation, respiratory function stable and patient connected to nasal cannula oxygen Cardiovascular status: stable and blood pressure returned to baseline Postop Assessment: no apparent nausea or vomiting Anesthetic complications: no  No notable events documented.  Last Vitals:  Vitals:   09/11/22 1000 09/11/22 1010  BP: 116/84 (!) 147/80  Pulse: (!) 59 (!) 57  Resp: 17 14  Temp:    SpO2: 100% 100%    Last Pain:  Vitals:   09/11/22 1010  TempSrc:   PainSc: 0-No pain                 Thoren Hosang S

## 2022-09-11 NOTE — Progress Notes (Signed)
Patient has a technically successful EUS this morning. Will follow for path results.   Oncology Nurse Navigator Documentation     09/11/2022   10:00 AM  Oncology Nurse Navigator Flowsheets  Navigator Follow Up Date: 09/13/2022  Navigator Follow Up Reason: Pathology  Navigator Location [facility]  Navigator Encounter Type Appt/Treatment Plan Review  Patient Visit Type MedOnc  Treatment Phase Abnormal Scans;Abnormal Labs  Barriers/Navigation Needs Coordination of Care  Interventions None Required  Acuity Level 2-Minimal Needs (1-2 Barriers Identified)  Time Spent with Patient 15

## 2022-09-11 NOTE — Op Note (Signed)
[facility] Patient Name: Alexander [name] Procedure Date: 09/11/2022 MRN: [id number] Attending MD: Corliss Parish , MD, [id number] Date of Birth: 03-22-1940 CSN: [id number] Age: 82 Admit Type: Ambulatory Procedure:                Upper EUS Indications:              CBD stricture on MRCP, Suspected mass in pancreas                            on MRCP, Abnormal lab work Providers:                Corliss Parish, MD, Doristine Mango, RN,                            Jacquelyn "Jaci" Clelia Croft, RN, Marja Kays,                            Technician Referring MD:             Shirline Frees, Wilhemina Bonito. Marina Goodell, MD, Venita Lick. Russella Dar,                            MD, Rose Phi. Ennever MD, MD Medicines:                Monitored Anesthesia Care Complications:            No immediate complications. Estimated Blood Loss:     Estimated blood loss was minimal. Procedure:                Pre-Anesthesia Assessment:                           - Prior to the procedure, a History and Physical                            was performed, and patient medications and                            allergies were reviewed. The patient's tolerance of                            previous anesthesia was also reviewed. The risks                            and benefits of the procedure and the sedation                            options and risks were discussed with the patient.                            All questions were answered, and informed consent                            was obtained. [Anticoagulant Agents] [CZYS Prior to  Procedure]. [ASA Grade]. After reviewing the risks                            and benefits, the patient was deemed in                            satisfactory condition to undergo the procedure.                           After obtaining informed consent, the endoscope was                            passed under direct vision. Throughout the                             procedure, the patient's blood pressure, pulse, and                            oxygen saturations were monitored continuously. The                            [id number]-[id number] ([id number] Olympus endoscope was introduced                            through the mouth, and advanced to the second part                            of duodenum. After obtaining informed consent, the                            endoscope was passed under direct vision.                            Throughout the procedure, the patient's blood                            pressure, pulse, and oxygen saturations were                            monitored continuously. The [id number] ([id number])                            Olympus linear ultrasound scope was introduced                            through the mouth, and advanced to the duodenum for                            ultrasound examination from the stomach and                            duodenum. The upper EUS was accomplished without  difficulty. The patient tolerated the procedure. Scope In: Scope Out: Findings:      ENDOSCOPIC FINDING: :      No gross lesions were noted in the entire esophagus.      The Z-line was regular and was found 37 cm from the incisors.      A 2 cm hiatal hernia was present.      Segmental moderate inflammation characterized by erosions, erythema and       granularity was found in the gastric body and in the gastric antrum.      No other gross lesions were noted in the entire examined stomach.       Biopsies were taken with a cold forceps for histology and Helicobacter       pylori testing.      No gross lesions were noted in the duodenal bulb, in the first portion       of the duodenum and in the second portion of the duodenum.      A previously placed plastic biliary stent was seen at the major papilla.      ENDOSONOGRAPHIC FINDING: :      An irregular mass was identified in the pancreatic head (this is where        the CBD stent traverses what appears to be a stenosis and where the PD       begins to dilate). The mass was hypoechoic. The mass measured 26 mm by       24 mm in maximal cross-sectional diameter. The outer margins were       irregular. An intact interface was seen between the mass and the       superior mesenteric artery, celiac trunk and portal vein suggesting a       lack of invasion. The remainder of the pancreas was examined. The       endosonographic appearance of parenchyma and the upstream pancreatic       duct indicated duct dilation (PDH = 15 mm, PDN = 13 mm, PDB = 9.9 mm,       PDT = 9.1 mm), an irregularly contoured duct, prominent ductal       side-branches, visible ductal side-branches, a tortuous/ectatic duct,       parenchymal atrophy and parenchymal calcifications (within the head).       Fine needle biopsy was performed of the mass. Color Doppler imaging was       utilized prior to needle puncture to confirm a lack of significant       vascular structures within the needle path. Eight passes were made with       the 22 gauge Acquire biopsy needle using a transduodenal approach.       Visible cores of tissue were obtained. Preliminary cytologic examination       and touch preps were performed. Final cytology results are pending.      One stent was visualized endosonographically in the common bile duct.       Extension of the stent was noted into the CBD which measured 8 mm. There       was CBD thickening on 2.3 mm noted around the CBD stent as well.      Moderate hyperechoic material consistent with sludge was visualized       endosonographically in the gallbladder.      Endosonographic imaging in the visualized portion of the liver showed no       mass.  Two enlarged lymph nodes were visualized in the porta hepatis region.       The largest measured 5 mm by 6 mm in maximal cross-sectional diameter.       The nodes were round, hypoechoic and had well defined margins.       The celiac region was visualized. Impression:               EGD Impression:                           - No gross lesions in the entire esophagus. Z-line                            regular, 37 cm from the incisors.                           - 2 cm hiatal hernia.                           - Moderate gastritis and erosions noted in the                            body/antrum. No other gross lesions in the entire                            stomach. Biopsied.                           - No gross lesions in the duodenal bulb, in the                            first portion of the duodenum and in the second                            portion of the duodenum.                           - Plastic biliary stent at the major papilla.                           EUS Impression:                           - A mass was identified in the pancreatic head                            (where CBD/PD dilation is noted). Cytology results                            are pending. However, the endosonographic                            appearance is suspicious for adenocarcinoma. Fine                            needle biopsy performed of  the mass. This was                            staged T2 N0 Mx by endosonographic criteria. The                            staging applies if malignancy is confirmed. However                            see notation of the LN below.                           - One stent was visualized endosonographically in                            the common bile duct.                           - Hyperechoic material consistent with sludge was                            visualized endosonographically in the gallbladder.                           - Two enlarged lymph nodes were visualized in the                            porta hepatis region and although they are                            hypoechoic and could be concerning for disease,                            they do not meet EUS size criteria  for metastatic                            components (need to be 1 cm in size). Moderate Sedation:      Not Applicable - Patient had care per Anesthesia. Recommendation:           - The patient will be observed post-procedure,                            until all discharge criteria are met.                           - Discharge patient to home.                           - Patient has a contact number available for                            emergencies. The signs and symptoms of potential  delayed complications were discussed with the                            patient. Return to normal activities tomorrow.                            Written discharge instructions were provided to the                            patient.                           - Low fat diet.                           - Monitor for signs/symptoms of bleeding,                            perforation, and infection. If issues please call                            our number to get further assistance as needed.                           - Observe patient's clinical course.                           - Await cytology results and await path results.                           - Increase PPI to 40 mg twice daily.                           - If pathology is non-diagnostic, then will need to                            be scheduled as an EUS/ERCP where CBD stent will be                            removed, then repeat EUS FNB performed and then CBD                            brushings are obtained and stent re-inserted. The                            overall picture is quite concerning for underlying                            malignancy still.                           - The findings and recommendations were discussed                            with the patient.                           -  The findings and recommendations were discussed                            with the patient's family. Procedure  Code(s):        --- Professional ---                           [phone], Esophagogastroduodenoscopy, flexible,                            transoral; with transendoscopic ultrasound-guided                            intramural or transmural fine needle                            aspiration/biopsy(s), (includes endoscopic                            ultrasound examination limited to the esophagus,                            stomach or duodenum, and adjacent structures)                           43239, 59, Esophagogastroduodenoscopy, flexible,                            transoral; with biopsy, single or multiple Diagnosis Code(s):        --- Professional ---                           K44.9, Diaphragmatic hernia without obstruction or                            gangrene                           K29.70, Gastritis, unspecified, without bleeding                           K86.89, Other specified diseases of pancreas                           R59.0, Localized enlarged lymph nodes                           K83.1, Obstruction of bile duct                           R79.9, Abnormal finding of blood chemistry,                            unspecified                           K83.8, Other specified diseases of biliary tract  R93.3, Abnormal findings on diagnostic imaging of                            other parts of digestive tract CPT copyright 2022 American [employer]. All rights reserved. The codes documented in this report are preliminary and upon coder review may  be revised to meet current compliance requirements. Corliss Parish, MD 09/11/2022 9:59:54 AM Number of Addenda: 0

## 2022-09-11 NOTE — Discharge Instructions (Signed)
YOU HAD AN ENDOSCOPIC PROCEDURE TODAY: Refer to the procedure report and other information in the discharge instructions given to you for any specific questions about what was found during the examination. If this information does not answer your questions, please call [place] office at [phone] to clarify.  ° °YOU SHOULD EXPECT: Some feelings of bloating in the abdomen. Passage of more gas than usual. Walking can help get rid of the air that was put into your GI tract during the procedure and reduce the bloating.  ° °DIET: Your first meal following the procedure should be a light meal and then it is ok to progress to your normal diet. A half-sandwich or bowl of soup is an example of a good first meal. Heavy or fried foods are harder to digest and may make you feel nauseous or bloated. Drink plenty of fluids but you should avoid alcoholic beverages for 24 hours. ° °ACTIVITY: Your care partner should take you home directly after the procedure. You should plan to take it easy, moving slowly for the rest of the day. You can resume normal activity the day after the procedure however YOU SHOULD NOT DRIVE, use power tools, machinery or perform tasks that involve climbing or major physical exertion for 24 hours (because of the sedation medicines used during the test).  ° °SYMPTOMS TO REPORT IMMEDIATELY: °A gastroenterologist can be reached at any hour. Please call [phone]  for any of the following symptoms:  ° °Following upper endoscopy (EGD, EUS, ERCP, esophageal dilation) °Vomiting of blood or coffee ground material  °New, significant abdominal pain  °New, significant chest pain or pain under the shoulder blades  °Painful or persistently difficult swallowing  °New shortness of breath  °Black, tarry-looking or red, bloody stools ° °FOLLOW UP:  °If any biopsies were taken you will be contacted by phone or by letter within the next 1-3 weeks. Call [phone]  if you have not heard about the biopsies in 3 weeks.    °Please also call with any specific questions about appointments or follow up tests. ° °

## 2022-09-12 ENCOUNTER — Encounter: Payer: Self-pay | Admitting: Gastroenterology

## 2022-09-12 LAB — SURGICAL PATHOLOGY

## 2022-09-13 ENCOUNTER — Encounter: Payer: Self-pay | Admitting: Adult Health

## 2022-09-13 ENCOUNTER — Ambulatory Visit (INDEPENDENT_AMBULATORY_CARE_PROVIDER_SITE_OTHER): Payer: Medicare HMO | Admitting: Adult Health

## 2022-09-13 VITALS — BP 138/70 | HR 60 | Temp 98.7°F | Ht 71.0 in | Wt 136.0 lb

## 2022-09-13 DIAGNOSIS — Z7984 Long term (current) use of oral hypoglycemic drugs: Secondary | ICD-10-CM

## 2022-09-13 DIAGNOSIS — E119 Type 2 diabetes mellitus without complications: Secondary | ICD-10-CM

## 2022-09-13 NOTE — Progress Notes (Signed)
Subjective:    Patient ID: Alexander [name], male    DOB: August 09, 1940, 82 y.o.   MRN: [id number]  HPI  He presents to the office today for follow-up regarding diabetes mellitus.  Recently placed on glipizide 5 mg extended release.  He reports that he is checking his blood sugars in the morning while he has been fasting with readings in the 160's.  Has not been checking his blood sugars at other times of the day.   Reports that he feels great but is waiting on pathology report on his pancreatic mass.  He is eating well, sleeping well, and continues to exercise.  Review of Systems  See HPI    Past Medical History:  Diagnosis Date   Arthritis    "minor; shoulders primarily" (05/20/2013)   CAD (coronary artery disease)    a. 05/2013 - Chest pain/unstable angina and dynamic EKG changes showing cath showing atherosclerotic coronary artery disease manifested as diffuse ectasia, normal EF.   Diverticulosis of colon    GERD (gastroesophageal reflux disease)    GI bleed    secondary to Aspirin   Gout    Hematuria, microscopic    Hemorrhoids    Hyperglycemia    Hyperlipidemia    Hypertension    Lumbar back pain    Microalbuminuria    Overweight(278.02)    Polycythemia rubra vera (HCC)    Tubular adenoma of colon 07/2012    Social History   Socioeconomic History   Marital status: Married    Spouse name: Not on file   Number of children: 3   Years of education: Not on file   Highest education level: Some college, no degree  Occupational History   Occupation: retired Garment/textile technologist: RETIRED  Tobacco Use   Smoking status: Never   Smokeless tobacco: Never   Tobacco comments:    NEVER USED TOBACCO  Vaping Use   Vaping status: Never Used  Substance and Sexual Activity   Alcohol use: No    Alcohol/week: 0.0 standard drinks of alcohol   Drug use: No   Sexual activity: Yes    Partners: Female  Other Topics Concern   Not on file  Social History Narrative   Marriedfor  52 years    Daughter, Physicist, medical (pediatrician--lives in [place], [place]), one in [place] - Warden/ranger. One in [place] - Chartered loss adjuster          Social Determinants of Health   Financial Resource Strain: Low Risk  (06/11/2022)   Overall Financial Resource Strain (CARDIA)    Difficulty of Paying Living Expenses: Not hard at all  Food Insecurity: No Food Insecurity (09/03/2022)   Hunger Vital Sign    Worried About Running Out of Food in the Last Year: Never true    Ran Out of Food in the Last Year: Never true  Transportation Needs: No Transportation Needs (09/03/2022)   PRAPARE - Administrator, Civil Service (Medical): No    Lack of Transportation (Non-Medical): No  Physical Activity: Sufficiently Active (06/11/2022)   Exercise Vital Sign    Days of Exercise per Week: 7 days    Minutes of Exercise per Session: 90 min  Stress: No Stress Concern Present (06/11/2022)   [employer] of Occupational Health - Occupational Stress Questionnaire    Feeling of Stress : Not at all  Social Connections: Socially Integrated (06/11/2022)   Social Connection and Isolation Panel [NHANES]    Frequency of Communication with Friends and Family: More than  three times a week    Frequency of Social Gatherings with Friends and Family: Twice a week    Attends Religious Services: More than 4 times per year    Active Member of Clubs or Organizations: Yes    Attends Banker Meetings: More than 4 times per year    Marital Status: Married  Catering manager Violence: Not At Risk (09/03/2022)   Humiliation, Afraid, Rape, and Kick questionnaire    Fear of Current or Ex-Partner: No    Emotionally Abused: No    Physically Abused: No    Sexually Abused: No    Past Surgical History:  Procedure Laterality Date   BILIARY STENT PLACEMENT N/A 09/02/2022   Procedure: BILIARY STENT PLACEMENT;  Surgeon: Hilarie Fredrickson, MD;  Location: [facility];  Service: Gastroenterology;  Laterality: N/A;   CARDIAC CATHETERIZATION   05/20/2013   CATARACT EXTRACTION W/ INTRAOCULAR LENS  IMPLANT, BILATERAL Bilateral 2008   CYSTECTOMY  ~ 2000   " SEBACEOUS cyst removed from between shoulder blades"   ENDOSCOPIC RETROGRADE CHOLANGIOPANCREATOGRAPHY (ERCP) WITH PROPOFOL N/A 09/02/2022   Procedure: ENDOSCOPIC RETROGRADE CHOLANGIOPANCREATOGRAPHY (ERCP) WITH PROPOFOL;  Surgeon: Hilarie Fredrickson, MD;  Location: [facility];  Service: Gastroenterology;  Laterality: N/A;   EXCISIONAL HEMORRHOIDECTOMY  1970's   INGUINAL HERNIA REPAIR Right ~ 2010   LEFT HEART CATHETERIZATION WITH CORONARY ANGIOGRAM N/A 05/20/2013   Procedure: LEFT HEART CATHETERIZATION WITH CORONARY ANGIOGRAM;  Surgeon: Peter M Swaziland, MD;  Location: [facility];  Service: Cardiovascular;  Laterality: N/A;   SPHINCTEROTOMY  09/02/2022   Procedure: SPHINCTEROTOMY;  Surgeon: Hilarie Fredrickson, MD;  Location: [facility];  Service: Gastroenterology;;   TONSILLECTOMY AND ADENOIDECTOMY  1940's   VASECTOMY      Family History  Problem Relation Age of Onset   Colon cancer Mother        died at 72, colorectal   Diabetes Mother    Heart disease Father 57       MI   Diabetes Father    Stomach cancer Maternal Grandfather    Cancer Maternal Uncle     Allergies  Allergen Reactions   Metformin And Related Diarrhea   Metformin Hcl Diarrhea    Current Outpatient Medications on File Prior to Visit  Medication Sig Dispense Refill   amLODipine (NORVASC) 10 MG tablet TAKE ONE TABLET BY MOUTH DAILY 90 tablet 3   glipiZIDE (GLUCOTROL XL) 10 MG 24 hr tablet Take 1 tablet (10 mg total) by mouth daily with breakfast. 90 tablet 1   pantoprazole (PROTONIX) 40 MG tablet Take 1 tablet (40 mg total) by mouth 2 (two) times daily before a meal. 60 tablet 6   Accu-Chek Softclix Lancets lancets Use to test blood glucose once daily (Patient not taking: Reported on 09/13/2022) 100 each 3   allopurinol (ZYLOPRIM) 100 MG tablet TAKE ONE TABLET BY MOUTH DAILY (Patient not taking: Reported on  09/13/2022) 365 tablet 0   aspirin EC 81 MG tablet Take 1 tablet (81 mg total) by mouth daily. (Patient not taking: Reported on 09/05/2022) 90 tablet 3   Blood Glucose Calibration (EMBRACE PRO GLUCOSE CONTROL) LIQD Use to check blood glucose TID (Patient not taking: Reported on 09/13/2022) 1 each    Blood Glucose Monitoring Suppl (ACCU-CHEK AVIVA PLUS) w/Device KIT Use to test blood glucose once daily (Patient not taking: Reported on 09/13/2022) 1 kit 0   Coenzyme Q10 300 MG CAPS Take 1 capsule by mouth daily.  (Patient not taking: Reported on 09/13/2022)  glucose blood (EMBRACE PRO GLUCOSE TEST) test strip Use as instructed (Patient not taking: Reported on 09/13/2022) 100 each 12   hydrocortisone (ANUSOL-HC) 25 MG suppository Place 25 mg rectally daily as needed for hemorrhoids or anal itching. (Patient not taking: Reported on 09/13/2022)     pravastatin (PRAVACHOL) 40 MG tablet TAKE ONE TABLET BY MOUTH DAILY (Patient not taking: Reported on 09/05/2022) 365 tablet 0   PREVIDENT 5000 ENAMEL PROTECT 1.1-5 % PSTE See admin instructions. (Patient not taking: Reported on 09/13/2022)     PROCTO-MED HC 2.5 % rectal cream APPLY 1 CREAM RECTALLY TWICE DAILY (Patient not taking: Reported on 09/13/2022) 90 g 3   No current facility-administered medications on file prior to visit.    BP 138/70   Pulse 60   Temp 98.7 F (37.1 C) (Oral)   Ht 5\' 11"  (1.803 m)   Wt 136 lb (61.7 kg)   SpO2 99%   BMI 18.97 kg/m       Objective:   Physical Exam Vitals and nursing note reviewed.  Constitutional:      Appearance: Normal appearance.  Musculoskeletal:        General: Normal range of motion.  Skin:    General: Skin is warm and dry.     Capillary Refill: Capillary refill takes less than 2 seconds.     Coloration: Skin is jaundiced (improved since last visit).  Neurological:     General: No focal deficit present.     Mental Status: He is alert and oriented to person, place, and time.  Psychiatric:        Mood  and Affect: Mood normal.        Behavior: Behavior normal.        Thought Content: Thought content normal.        Judgment: Judgment normal.        Assessment & Plan:   1. Diabetes mellitus treated with oral medication (HCC) -Will have him monitor his blood sugars throughout the afternoon in the evening and he will send me a MyChart message with these readings.  If needed we can increase glipizide 10 mg extended release I would like him on a higher calorie diet to regain some weight that he is lost.  Shirline Frees, NP

## 2022-09-13 NOTE — Patient Instructions (Addendum)
It was great seeing you today   Please take glipizide 10 mg before breakfast. Check your fasting blood sugar and then before lunch and dinner. You can also check a few hours after lunch.   Let me know if you need anything

## 2022-09-14 ENCOUNTER — Other Ambulatory Visit: Payer: Self-pay | Admitting: Adult Health

## 2022-09-14 ENCOUNTER — Encounter: Payer: Self-pay | Admitting: *Deleted

## 2022-09-14 LAB — CYTOLOGY - NON PAP

## 2022-09-14 MED ORDER — FREESTYLE LIBRE 3 SENSOR MISC
1.0000 | 6 refills | Status: DC
Start: 2022-09-14 — End: 2023-03-01

## 2022-09-14 NOTE — Addendum Note (Signed)
Addended by: Nancy Fetter on: 09/14/2022 12:22 PM   Modules accepted: Orders

## 2022-09-14 NOTE — Progress Notes (Signed)
Per Dr Myna Hidalgo, request for Foundation One testing sent on specimen [id number] DOS 09/11/2022.  Oncology Nurse Navigator Documentation     09/14/2022   10:30 AM  Oncology Nurse Navigator Flowsheets  Confirmed Diagnosis Date 09/11/2022  Diagnosis Status Pending Molecular Studies  Navigator Follow Up Date: 09/15/2022  Navigator Follow Up Reason: Review Note  Navigator Location [facility] [facility]  Navigator Encounter Type Molecular Studies;Pathology Review  Patient Visit Type MedOnc  Treatment Phase Pre-Tx/Tx Discussion  Barriers/Navigation Needs Coordination of Care  Interventions Coordination of Care  Acuity Level 2-Minimal Needs (1-2 Barriers Identified)  Coordination of Care Pathology  Support Groups/Services Friends and Family  Time Spent with Patient 30

## 2022-09-14 NOTE — Telephone Encounter (Signed)
Please advise 

## 2022-09-15 ENCOUNTER — Encounter: Payer: Self-pay | Admitting: Adult Health

## 2022-09-15 ENCOUNTER — Telehealth: Payer: Self-pay | Admitting: *Deleted

## 2022-09-15 ENCOUNTER — Encounter: Payer: Self-pay | Admitting: *Deleted

## 2022-09-15 DIAGNOSIS — I2510 Atherosclerotic heart disease of native coronary artery without angina pectoris: Secondary | ICD-10-CM

## 2022-09-15 DIAGNOSIS — G4733 Obstructive sleep apnea (adult) (pediatric): Secondary | ICD-10-CM

## 2022-09-15 NOTE — Telephone Encounter (Signed)
FYI

## 2022-09-15 NOTE — Progress Notes (Signed)
Patient was seen today by Dr Donell Beers. She reviewed all the choices available to him, including upfront surgery. He and his family were present. At the end of the appointment, patient wanted to have further discussion with his family before making a decision.   Will follow up with the patient next week regarding the treatment plan he would like tp pursue.   Oncology Nurse Navigator Documentation     09/15/2022   12:00 PM  Oncology Nurse Navigator Flowsheets  Navigator Follow Up Date: 09/19/2022  Navigator Follow Up Reason: Patient Call  Navigator Location [facility]  Navigator Encounter Type Appt/Treatment Plan Review  Patient Visit Type MedOnc  Treatment Phase Pre-Tx/Tx Discussion  Barriers/Navigation Needs Coordination of Care  Interventions None Required  Acuity Level 2-Minimal Needs (1-2 Barriers Identified)  Support Groups/Services Friends and Family  Time Spent with Patient 15

## 2022-09-15 NOTE — Telephone Encounter (Signed)
Pt has been scheduled for tele pre op appt 09/28/22 @ 2 pm. Med rec and consent are done.      Patient Consent for Virtual Visit        Alexander [name] has provided verbal consent on 09/15/2022 for a virtual visit (video or telephone).   CONSENT FOR VIRTUAL VISIT FOR:  Alexander [name]  By participating in this virtual visit I agree to the following:  I hereby voluntarily request, consent and authorize [facility] and its employed or contracted physicians, physician assistants, nurse practitioners or other licensed health care professionals (the Practitioner), to provide me with telemedicine health care services (the "Services") as deemed necessary by the treating Practitioner. I acknowledge and consent to receive the Services by the Practitioner via telemedicine. I understand that the telemedicine visit will involve communicating with the Practitioner through live audiovisual communication technology and the disclosure of certain medical information by electronic transmission. I acknowledge that I have been given the opportunity to request an in-person assessment or other available alternative prior to the telemedicine visit and am voluntarily participating in the telemedicine visit.  I understand that I have the right to withhold or withdraw my consent to the use of telemedicine in the course of my care at any time, without affecting my right to future care or treatment, and that the Practitioner or I may terminate the telemedicine visit at any time. I understand that I have the right to inspect all information obtained and/or recorded in the course of the telemedicine visit and may receive copies of available information for a reasonable fee.  I understand that some of the potential risks of receiving the Services via telemedicine include:  Delay or interruption in medical evaluation due to technological equipment failure or disruption; Information transmitted may not be  sufficient (e.g. poor resolution of images) to allow for appropriate medical decision making by the Practitioner; and/or  In rare instances, security protocols could fail, causing a breach of personal health information.  Furthermore, I acknowledge that it is my responsibility to provide information about my medical history, conditions and care that is complete and accurate to the best of my ability. I acknowledge that Practitioner's advice, recommendations, and/or decision may be based on factors not within their control, such as incomplete or inaccurate data provided by me or distortions of diagnostic images or specimens that may result from electronic transmissions. I understand that the practice of medicine is not an exact science and that Practitioner makes no warranties or guarantees regarding treatment outcomes. I acknowledge that a copy of this consent can be made available to me via my patient portal [facility] MyChart), or I can request a printed copy by calling the office of [facility].    I understand that my insurance will be billed for this visit.   I have read or had this consent read to me. I understand the contents of this consent, which adequately explains the benefits and risks of the Services being provided via telemedicine.  I have been provided ample opportunity to ask questions regarding this consent and the Services and have had my questions answered to my satisfaction. I give my informed consent for the services to be provided through the use of telemedicine in my medical care

## 2022-09-15 NOTE — Telephone Encounter (Signed)
Pre-operative Risk Assessment    Patient Name: Alexander [name]  DOB: August 04, 1940 MRN: [id number]      Request for Surgical Clearance    Procedure:   Whipple  Date of Surgery:  Clearance TBD                                 Surgeon:  Dr. Almond Lint Surgeon's Group or Practice Name:  [facility] Phone number:  [phone] Fax number:  [phone]   Type of Clearance Requested:   - Medical  - Pharmacy:  Hold Aspirin Not Indicated.   Type of Anesthesia:  General    Additional requests/questions:    Signed, Emmit Pomfret   09/15/2022, 2:07 PM

## 2022-09-15 NOTE — Telephone Encounter (Signed)
Pt has been scheduled for tele pre op appt 09/28/22 @ 2 pm. Med rec and consent are done.

## 2022-09-15 NOTE — Telephone Encounter (Signed)
Name: Alexander [name]  DOB: Sep 30, 1940  MRN: [id number]  Primary Cardiologist: Donato Schultz, MD   Preoperative team, please contact this patient and set up a phone call appointment for further preoperative risk assessment. Please obtain consent and complete medication review. Thank you for your help.  I confirm that guidance regarding antiplatelet and oral anticoagulation therapy has been completed and, if necessary, noted below.  Per office protocol, if patient is without any new symptoms or concerns at the time of their virtual visit, he may hold Aspirin for 5-7 days prior to procedure. Please resume Aspirin as soon as possible postprocedure, at the discretion of the surgeon.    Joylene Grapes, NP 09/15/2022, 2:35 PM [facility]

## 2022-09-16 ENCOUNTER — Encounter: Payer: Self-pay | Admitting: Hematology & Oncology

## 2022-09-17 ENCOUNTER — Encounter (HOSPITAL_COMMUNITY): Payer: Self-pay | Admitting: Gastroenterology

## 2022-09-19 ENCOUNTER — Encounter: Payer: Self-pay | Admitting: *Deleted

## 2022-09-19 NOTE — Progress Notes (Signed)
Patient would like to come in a discuss his options to make a final decision on treatment plan.   Oncology Nurse Navigator Documentation     09/19/2022    8:15 AM  Oncology Nurse Navigator Flowsheets  Navigator Follow Up Date: 09/22/2022  Navigator Follow Up Reason: Follow-up Appointment  Navigator Location [facility]  Navigator Encounter Type Appt/Treatment Plan Review  Patient Visit Type MedOnc  Treatment Phase Pre-Tx/Tx Discussion  Barriers/Navigation Needs Coordination of Care  Interventions None Required  Acuity Level 2-Minimal Needs (1-2 Barriers Identified)  Support Groups/Services Friends and Family  Time Spent with Patient 15

## 2022-09-22 ENCOUNTER — Inpatient Hospital Stay: Payer: Medicare HMO

## 2022-09-22 ENCOUNTER — Encounter: Payer: Self-pay | Admitting: Hematology & Oncology

## 2022-09-22 ENCOUNTER — Encounter: Payer: Self-pay | Admitting: *Deleted

## 2022-09-22 ENCOUNTER — Encounter: Payer: Self-pay | Admitting: Adult Health

## 2022-09-22 ENCOUNTER — Inpatient Hospital Stay (HOSPITAL_BASED_OUTPATIENT_CLINIC_OR_DEPARTMENT_OTHER): Payer: Medicare HMO | Admitting: Hematology & Oncology

## 2022-09-22 VITALS — BP 137/68 | HR 69 | Temp 97.9°F | Resp 20 | Ht 71.0 in | Wt 136.0 lb

## 2022-09-22 DIAGNOSIS — E1165 Type 2 diabetes mellitus with hyperglycemia: Secondary | ICD-10-CM | POA: Diagnosis not present

## 2022-09-22 DIAGNOSIS — R634 Abnormal weight loss: Secondary | ICD-10-CM | POA: Diagnosis not present

## 2022-09-22 DIAGNOSIS — K8689 Other specified diseases of pancreas: Secondary | ICD-10-CM

## 2022-09-22 DIAGNOSIS — C250 Malignant neoplasm of head of pancreas: Secondary | ICD-10-CM

## 2022-09-22 DIAGNOSIS — C259 Malignant neoplasm of pancreas, unspecified: Secondary | ICD-10-CM | POA: Insufficient documentation

## 2022-09-22 DIAGNOSIS — D45 Polycythemia vera: Secondary | ICD-10-CM

## 2022-09-22 HISTORY — DX: Malignant neoplasm of pancreas, unspecified: C25.9

## 2022-09-22 LAB — CBC WITH DIFFERENTIAL (CANCER CENTER ONLY)
Abs Immature Granulocytes: 0.07 10*3/uL (ref 0.00–0.07)
Basophils Absolute: 0.1 10*3/uL (ref 0.0–0.1)
Basophils Relative: 1 %
Eosinophils Absolute: 0.1 10*3/uL (ref 0.0–0.5)
Eosinophils Relative: 1 %
HCT: 39.6 % (ref 39.0–52.0)
Hemoglobin: 13.3 g/dL (ref 13.0–17.0)
Immature Granulocytes: 1 %
Lymphocytes Relative: 12 %
Lymphs Abs: 1.2 10*3/uL (ref 0.7–4.0)
MCH: 32.8 pg (ref 26.0–34.0)
MCHC: 33.6 g/dL (ref 30.0–36.0)
MCV: 97.8 fL (ref 80.0–100.0)
Monocytes Absolute: 0.8 10*3/uL (ref 0.1–1.0)
Monocytes Relative: 8 %
Neutro Abs: 8 10*3/uL — ABNORMAL HIGH (ref 1.7–7.7)
Neutrophils Relative %: 77 %
Platelet Count: 293 10*3/uL (ref 150–400)
RBC: 4.05 MIL/uL — ABNORMAL LOW (ref 4.22–5.81)
RDW: 14.4 % (ref 11.5–15.5)
WBC Count: 10.2 10*3/uL (ref 4.0–10.5)
nRBC: 0 % (ref 0.0–0.2)

## 2022-09-22 LAB — CMP (CANCER CENTER ONLY)
ALT: 100 U/L — ABNORMAL HIGH (ref 0–44)
AST: 31 U/L (ref 15–41)
Albumin: 3.5 g/dL (ref 3.5–5.0)
Alkaline Phosphatase: 559 U/L — ABNORMAL HIGH (ref 38–126)
Anion gap: 7 (ref 5–15)
BUN: 12 mg/dL (ref 8–23)
CO2: 27 mmol/L (ref 22–32)
Calcium: 8.9 mg/dL (ref 8.9–10.3)
Chloride: 99 mmol/L (ref 98–111)
Creatinine: 1.13 mg/dL (ref 0.61–1.24)
GFR, Estimated: >60 mL/min (ref >60)
Glucose, Bld: 355 mg/dL — ABNORMAL HIGH (ref 70–99)
Potassium: 4.6 mmol/L (ref 3.5–5.1)
Sodium: 133 mmol/L — ABNORMAL LOW (ref 135–145)
Total Bilirubin: 3 mg/dL — ABNORMAL HIGH (ref 0.3–1.2)
Total Protein: 6.3 g/dL — ABNORMAL LOW (ref 6.5–8.1)

## 2022-09-22 LAB — LACTATE DEHYDROGENASE: LDH: 115 U/L (ref 98–192)

## 2022-09-22 MED ORDER — INSULIN ASPART 100 UNIT/ML IJ SOLN
10.0000 [IU] | Freq: Once | INTRAMUSCULAR | Status: AC
  Administered 2022-09-22: 10 [IU] via SUBCUTANEOUS
  Filled 2022-09-22: qty 0.1

## 2022-09-22 NOTE — Progress Notes (Signed)
Hematology and Oncology Follow Up Visit  Alexander [name] [id number] 09-26-1940 82 y.o. 09/22/2022   Principle Diagnosis:  Adenocarcinoma of the pancreas-stage Ib (T2N0M0) --clinical stage  Current Therapy:   Status post biliary stent placement     Interim History:  Alexander [name] is back for follow-up.  We have been seeing him for many years because of his history of polycythemia.  However, when we last saw him back in 08/22/2022 he was incredibly jaundiced.  He had lost weight.  He was not eating well.  He had to be admitted.  He was found to have a mass at the head of the pancreas.  On MRCP, this measured 2.5 x 2 x 1.9 cm.  He had no involvement of his arterial supply with this mass.  There is no obvious adenopathy.  There may be some minimal contact with the posterior superior mesenteric vein.   Gastroenterology went ahead and put in a stent which has helped open up the blockage of his biliary duct.  His last CA 19-9 was 282.  He has seen Alexander [name] of surgical oncology.  She has recommended neoadjuvant chemotherapy followed by surgery.  He has an appointment with Alexander [name] of Surgical Oncology at [facility] on 10/02/2022.  He feels better.  He still losing a little bit of weight.  He is eating more.  He is having no nausea or vomiting.  He is having no problems with cough.  He says his urine is lighter now.  He has had no leg swelling.  He has had no rashes.  Overall, I would have said that his performance status is probably ECOG 1.  Medications:  Current Outpatient Medications:    Accu-Chek Softclix Lancets lancets, Use to test blood glucose once daily, Disp: 100 each, Rfl: 3   Blood Glucose Calibration (EMBRACE PRO GLUCOSE CONTROL) LIQD, Use to check blood glucose TID, Disp: 1 each, Rfl:    Blood Glucose Monitoring Suppl (ACCU-CHEK AVIVA PLUS) w/Device KIT, Use to test blood glucose once daily, Disp: 1 kit, Rfl: 0   glipiZIDE (GLUCOTROL XL) 2.5 MG 24 hr tablet, Take 5  mg by mouth daily with breakfast., Disp: , Rfl:    glucose blood (EMBRACE PRO GLUCOSE TEST) test strip, Use as instructed, Disp: 100 each, Rfl: 12   lisinopril (ZESTRIL) 10 MG tablet, Take 10 mg by mouth daily., Disp: , Rfl:    pantoprazole (PROTONIX) 40 MG tablet, Take 1 tablet (40 mg total) by mouth 2 (two) times daily before a meal., Disp: 60 tablet, Rfl: 6   amLODipine (NORVASC) 10 MG tablet, TAKE ONE TABLET BY MOUTH DAILY (Patient not taking: Reported on 09/22/2022), Disp: 90 tablet, Rfl: 3   Continuous Glucose Sensor (FREESTYLE LIBRE 3 SENSOR) MISC, 1 Device by Does not apply route every 14 (fourteen) days. Place 1 sensor on the skin every 14 days. Use to check glucose continuously, Disp: 2 each, Rfl: 6  Allergies:  Allergies  Allergen Reactions   Metformin And Related Diarrhea   Metformin Hcl Diarrhea    Past Medical History, Surgical history, Social history, and Family History were reviewed and updated.  Review of Systems: Review of Systems  Constitutional:  Positive for unexpected weight change.  HENT:  Negative.    Eyes:  Positive for icterus.  Respiratory: Negative.    Cardiovascular: Negative.   Gastrointestinal: Negative.   Endocrine: Negative.   Genitourinary: Negative.    Musculoskeletal: Negative.   Skin:  Positive for rash.  Neurological: Negative.  Hematological: Negative.   Psychiatric/Behavioral: Negative.      Physical Exam:  height is 5\' 11"  (1.803 m) and weight is 136 lb 0.6 oz (61.7 kg). His oral temperature is 97.9 F (36.6 C). His blood pressure is 137/68 and his pulse is 69. His respiration is 20 and oxygen saturation is 100%.   Wt Readings from Last 3 Encounters:  09/22/22 136 lb 0.6 oz (61.7 kg)  09/13/22 136 lb (61.7 kg)  09/05/22 138 lb (62.6 kg)    Physical Exam Vitals reviewed.  HENT:     Head: Normocephalic and atraumatic.  Eyes:     Pupils: Pupils are equal, round, and reactive to light.     Comments: Ocular exam does show some  slight scleral icterus.  Cardiovascular:     Rate and Rhythm: Normal rate and regular rhythm.     Heart sounds: Normal heart sounds.  Pulmonary:     Effort: Pulmonary effort is normal.     Breath sounds: Normal breath sounds.  Abdominal:     General: Bowel sounds are normal.     Palpations: Abdomen is soft.     Comments: Abdominal exam is soft.  He has good bowel sounds.  There is no fluid wave.  There is no obvious abdominal mass.  There is no palpable hepatosplenomegaly.  Musculoskeletal:        General: No tenderness or deformity. Normal range of motion.     Cervical back: Normal range of motion.  Lymphadenopathy:     Cervical: No cervical adenopathy.  Skin:    General: Skin is warm and dry.     Findings: No erythema or rash.     Comments: Skin exam does not show any obvious rashes.  He has some jaundice.  Neurological:     Mental Status: He is alert and oriented to person, place, and time.  Psychiatric:        Behavior: Behavior normal.        Thought Content: Thought content normal.        Judgment: Judgment normal.      Lab Results  Component Value Date   WBC 10.2 09/22/2022   HGB 13.3 09/22/2022   HCT 39.6 09/22/2022   MCV 97.8 09/22/2022   PLT 293 09/22/2022     Chemistry      Component Value Date/Time   NA 133 (L) 09/22/2022 1046   NA 144 11/29/2016 1054   NA 139 12/29/2015 0954   K 4.6 09/22/2022 1046   K 4.2 11/29/2016 1054   K 4.1 12/29/2015 0954   CL 99 09/22/2022 1046   CL 102 11/29/2016 1054   CO2 27 09/22/2022 1046   CO2 30 11/29/2016 1054   CO2 26 12/29/2015 0954   BUN 12 09/22/2022 1046   BUN 22 11/29/2016 1054   BUN 20.0 12/29/2015 0954   CREATININE 1.13 09/22/2022 1046   CREATININE 1.2 11/29/2016 1054   CREATININE 1.3 12/29/2015 0954      Component Value Date/Time   CALCIUM 8.9 09/22/2022 1046   CALCIUM 9.3 11/29/2016 1054   CALCIUM 9.5 12/29/2015 0954   ALKPHOS 559 (H) 09/22/2022 1046   ALKPHOS 92 (H) 11/29/2016 1054   ALKPHOS 98  12/29/2015 0954   AST 31 09/22/2022 1046   AST 16 12/29/2015 0954   ALT 100 (H) 09/22/2022 1046   ALT 20 11/29/2016 1054   ALT 13 12/29/2015 0954   BILITOT 3.0 (H) 09/22/2022 1046   BILITOT 0.71 12/29/2015 0954  Impression and Plan: Alexander [name] is a very nice 82 year old white male.  Again, we have been seeing him for polycythemia vera.  Now, his problem is he has early stage pancreatic cancer.  We will go ahead and get the CT of his chest done.  I suspect that this has to be negative.  I saw that his CA 27.29 was on the elevated side.  Again, I suspect this probably is because of the biliary blockage.  We will see what the level is now.  Again, I think that if he did have upfront surgery, that would certainly be my option for him.  He says that he reacts he may not wish to have surgery.  At his age, he is worried about the possibility of complications and his quality of life not being all that good.  If he does not decide on surgery, I probably would use chemotherapy followed by low-dose chemotherapy and radiation therapy.  He is going to see Alexander [name] at [facility].  He will be interesting to see what he has to say about this.  Again, this is totally "out of the blue.".  He is always been so healthy.  I am just very surprised that he has developed this.  He does have diabetes now.  He has hyperglycemia.  I did give him some insulin in the office.  He will speak to his family doctor about try to help manage his blood sugars.  We will plan to get him back once he sees Alexander [name].  We will have to make sure that we try to send his specimen off for molecular analysis.  I am unsure if there is enough tissue for this.   Josph Macho, MD 9/20/20245:03 PM

## 2022-09-22 NOTE — Telephone Encounter (Signed)
FYI

## 2022-09-22 NOTE — Progress Notes (Unsigned)
Patient is seeking second opinion with surgical oncology at [facility]. His appointment is scheduled for 10/02/2022.  Oncology Nurse Navigator Documentation     09/22/2022   11:00 AM  Oncology Nurse Navigator Flowsheets  Navigator Follow Up Date: 10/02/2022  Navigator Follow Up Reason: Review Note  Navigator Location [facility]  Navigator Encounter Type Appt/Treatment Plan Review  Patient Visit Type MedOnc  Treatment Phase Pre-Tx/Tx Discussion  Barriers/Navigation Needs Coordination of Care  Interventions None Required  Acuity Level 2-Minimal Needs (1-2 Barriers Identified)  Support Groups/Services Friends and Family  Time Spent with Patient 15

## 2022-09-22 NOTE — Patient Instructions (Signed)
Insulin Aspart Injection What is this medication? INSULIN ASPART (IN su lin AS part) treats diabetes. It works by increasing insulin levels in your body, which decreases your blood sugar (glucose). It belongs to a group of medications called rapid-acting insulins. Changes to diet and exercise are often combined with this medication. This medicine may be used for other purposes; ask your health care provider or pharmacist if you have questions. COMMON BRAND NAME(S): [place], [employer], [employer], [employer], NovoLog, NovoLog [place], NovoLog PenFill What should I tell my care team before I take this medication? They need to know if you have any of these conditions: Episodes of low blood sugar Eye disease, vision problems Kidney disease Liver disease An unusual or allergic reaction to insulin, metacresol, other medications, foods, dyes, or preservatives Pregnant or trying to get pregnant Breast-feeding How should I use this medication? This medication is injected under the skin. Use it exactly as directed. It is important to follow the directions given to you by your care team. If you are using Novolog, you should start your meal within 5 to 10 minutes after injection. If you are using Fiasp, you should start your meal at the time of injection or within 20 minutes after injection. Have food ready before injection. Do not delay eating. You will be taught how to use this medication and how to adjust doses for activities and illness. Do not use more insulin than prescribed. Do not use it more or less often than prescribed. Always check the appearance of your insulin before using it. This insulin should be clear and colorless like water. Do not use if it is cloudy, thickened, colored, or has solid particles in it. If you use a pen, be sure to take off the outer needle cover before using the dose. It is important that you put your used needles and syringes in a special sharps container. Do  not put them in a trash can. If you do not have a sharps container, call your pharmacist or care team to get one. This medication comes with INSTRUCTIONS FOR USE. Ask your pharmacist for directions on how to use this medication. Read the information carefully. Talk to your pharmacist or care team if you have questions. Talk to your care team about the use of this medication in children. While it may be prescribed for children as young as 2 years for selected conditions, precautions do apply. Overdosage: If you think you have taken too much of this medicine contact a poison control center or emergency room at once. NOTE: This medicine is only for you. Do not share this medicine with others. What if I miss a dose? It is important not to miss a dose. Your care team should discuss a plan for missed doses with you. If you do miss a dose, follow their plan. Do not take double doses. What may interact with this medication? Some medications may affect your blood sugar levels or hide the symptoms of low blood sugar (hypoglycemia). Talk with your care team about all of the medications you take. They may suggest changes to your insulin dose or checking your blood sugar levels more often. Medications that may affect your blood sugar levels include: Alcohol Certain antibiotics, such as ciprofloxacin, levofloxacin, sulfamethoxazole; trimethoprim Certain medications for blood pressure or heart disease, such as benazepril, enalapril, lisinopril, losartan, valsartan Certain medications for mental health conditions, such as fluoxetine or olanzapine Diuretics, such as hydrochlorothiazide (HCTZ) Estrogen and progestin hormones Other medications for diabetes  Steroid medications, such as prednisone or cortisone Testosterone Thyroid hormones Medications that may mask symptoms of low blood sugar include: Beta blockers, such as atenolol, metoprolol, propranolol Clonidine Guanethidine Reserpine This list may not  describe all possible interactions. Give your health care provider a list of all the medicines, herbs, non-prescription drugs, or dietary supplements you use. Also tell them if you smoke, drink alcohol, or use illegal drugs. Some items may interact with your medicine. What should I watch for while using this medication? Visit your care team for regular checks on your progress. Your care team will monitor your hemoglobin A1C. This is a simple blood test. It measures your average blood sugar level over the past 3 months. It will help you and your care team manage your diabetes. Learn how to check your blood sugar levels. Know the symptoms of low and high blood sugar and how to manage them. Always carry a source of quick sugar with you for symptoms of low blood sugar. Examples include glucose tablets, juice, or sugar candy. Teach your family members, friends, and others how to help you if your blood sugar is too low and you are not awake enough to treat it. Talk to your care team if you have high blood sugar. You may need to adjust your insulin dose. Many factors can cause high blood sugar, including illness, stress, or a change in activity. Do not skip meals. Ask your care team if you should avoid alcohol. Many cough and cold products contain sugar or alcohol. These can affect blood sugar levels. Make sure that you have the correct syringe for the type of insulin you use. Do not change the brand or type of insulin or syringe unless your care team tells you to. Switching insulin brand or type can affect your blood sugar enough to cause serious adverse effects. Always keep an extra supply of insulin and related supplies on hand. Only use syringes once. Get rid of syringes and needles in a closed container to prevent accidental needle sticks. Do not share insulin pens or cartridges with anyone, even if the needle is changed. Each pen should only be used by one person. Sharing could cause an infection. Do not  use a syringe to take insulin out of an insulin pen. Doing this may result in the wrong dose of insulin. Wear a medical ID bracelet or chain. Carry a card that describes your condition. List the medications and doses you take on the card. What side effects may I notice from receiving this medication? Side effects that you should report to your care team as soon as possible: Allergic reactions--skin rash, itching, hives, swelling of the face, lips, tongue, or throat Low blood sugar (hypoglycemia)--tremors or shaking, anxiety, sweating, cold or clammy skin, confusion, dizziness, rapid heartbeat Low potassium level--muscle pain or cramps, unusual weakness or fatigue, fast or irregular heartbeat, constipation Side effects that usually do not require medical attention (report to your care team if they continue or are bothersome): Lipodystrophy--hardening or scarring of tissue at injection site Pain, redness, or irritation at injection site Weight gain This list may not describe all possible side effects. Call your doctor for medical advice about side effects. You may report side effects to FDA at 1-800-FDA-1088. Where should I keep my medication? Keep out of the reach of children and pets. Storage and expiration dates for different insulin products may vary. Check the label for information on how to store your insulin. Talk to your care team if you have  any questions. Do not freeze. Protect from direct light and heat. Do not use insulin if it is exposed to temperatures above 37 degrees C (98.6 degrees F). Do not use insulin if it has been frozen. Fiasp multi-dose vials, Novolog multiple-dose vials, or Fiasp FlexTouch pen Unopened (not in-use): Store at room temperature up to 30 degrees C (86 degrees F) for up to 28 days, or refrigerated until the expiration date. Opened (in-use): Store at room temperature or refrigerated for up to 28 days. Fiasp PenFill cartridges, Novolog PenFill cartridges, Novolog  FlexPen, or Novolog FlexTouch Unopened (not in-use): Store at room temperature up to 30 degrees C (86 degrees F) for up to 28 days, or refrigerated until the expiration date. Opened (in-use): Store at room temperature for up to 28 days. Do not refrigerate. Fiasp PumpCart cartridges Unopened (not in-use): Store at room temperature up to 30 degrees C (86 degrees F) for up to 18 days (including 4 days in the pump), or refrigerated until the expiration date. Opened (in-use): Store at room temperature for up to 4 days. Do not refrigerate. Insulin Pump Users Change the insulin in your pump reservoir as directed by the pump user manual or the insulin label, whichever is first. To get rid of medications that are no longer needed or have expired: Take the medication to a medication take-back program. Check with your pharmacy or law enforcement to find a location. If you cannot return the medication, ask your pharmacist or care team how to get rid of this medication safely. NOTE: This sheet is a summary. It may not cover all possible information. If you have questions about this medicine, talk to your doctor, pharmacist, or health care provider.  2024 Elsevier/Gold Standard (2021-10-17 00:00:00)

## 2022-09-23 LAB — CANCER ANTIGEN 19-9: CA 19-9: 239 U/mL — ABNORMAL HIGH (ref 0–35)

## 2022-09-25 ENCOUNTER — Encounter: Payer: Self-pay | Admitting: Hematology & Oncology

## 2022-09-27 ENCOUNTER — Ambulatory Visit: Payer: Medicare HMO | Admitting: Hematology & Oncology

## 2022-09-27 ENCOUNTER — Encounter (HOSPITAL_COMMUNITY): Payer: Self-pay | Admitting: Hematology & Oncology

## 2022-09-27 ENCOUNTER — Other Ambulatory Visit (HOSPITAL_COMMUNITY): Payer: Medicare HMO

## 2022-09-27 ENCOUNTER — Encounter: Payer: Self-pay | Admitting: Adult Health

## 2022-09-27 ENCOUNTER — Inpatient Hospital Stay: Payer: Medicare HMO

## 2022-09-27 ENCOUNTER — Ambulatory Visit (INDEPENDENT_AMBULATORY_CARE_PROVIDER_SITE_OTHER): Payer: Medicare HMO | Admitting: Adult Health

## 2022-09-27 VITALS — BP 122/80 | HR 78 | Temp 97.8°F | Ht 71.0 in | Wt 139.0 lb

## 2022-09-27 DIAGNOSIS — I10 Essential (primary) hypertension: Secondary | ICD-10-CM | POA: Diagnosis not present

## 2022-09-27 DIAGNOSIS — E119 Type 2 diabetes mellitus without complications: Secondary | ICD-10-CM | POA: Diagnosis not present

## 2022-09-27 DIAGNOSIS — Z7984 Long term (current) use of oral hypoglycemic drugs: Secondary | ICD-10-CM | POA: Diagnosis not present

## 2022-09-27 MED ORDER — GLIPIZIDE ER 5 MG PO TB24
5.0000 mg | ORAL_TABLET | Freq: Two times a day (BID) | ORAL | 0 refills | Status: AC
Start: 2022-09-27 — End: 2022-12-26

## 2022-09-27 NOTE — Progress Notes (Signed)
Subjective:    Patient ID: Alexander [name], male    DOB: July 18, 1940, 82 y.o.   MRN: [id number]  Diabetes    82 year old male who  has a past medical history of Arthritis, CAD (coronary artery disease), Diverticulosis of colon, GERD (gastroesophageal reflux disease), GI bleed, Gout, Hematuria, microscopic, Hemorrhoids, Hyperglycemia, Hyperlipidemia, Hypertension, Lumbar back pain, Microalbuminuria, Overweight(278.02), Pancreatic cancer (HCC) (09/22/2022), Polycythemia rubra vera (HCC), and Tubular adenoma of colon (07/2012).  He presents to the office today for diabetes mellitus type 2. He is currently taking Glipizide 5 mg ER daily. He reports that his blood sugars have been spiking into the 400s and then drops into the 100's. He is eating a lot more protein and complex carbs to help gain weight due to recent dx of pancreatic cancer. He is not eating ice cream or cakes.   He just started wearing the libre sensor with readings in the 120- HI. It looks like most of his blood sugars have been in the mid 200's but we do not ave a lot of data yet   Wt Readings from Last 3 Encounters:  09/27/22 139 lb (63 kg)  09/22/22 136 lb 0.6 oz (61.7 kg)  09/13/22 136 lb (61.7 kg)   HTN - managed with Norvasc 10 mg and lisinopril 10 mg. He feels well controlled.   Review of Systems See HPI   Past Medical History:  Diagnosis Date   Arthritis    "minor; shoulders primarily" (05/20/2013)   CAD (coronary artery disease)    a. 05/2013 - Chest pain/unstable angina and dynamic EKG changes showing cath showing atherosclerotic coronary artery disease manifested as diffuse ectasia, normal EF.   Diverticulosis of colon    GERD (gastroesophageal reflux disease)    GI bleed    secondary to Aspirin   Gout    Hematuria, microscopic    Hemorrhoids    Hyperglycemia    Hyperlipidemia    Hypertension    Lumbar back pain    Microalbuminuria    Overweight(278.02)    Pancreatic cancer (HCC) 09/22/2022    Polycythemia rubra vera (HCC)    Tubular adenoma of colon 07/2012    Social History   Socioeconomic History   Marital status: Married    Spouse name: Not on file   Number of children: 3   Years of education: Not on file   Highest education level: Some college, no degree  Occupational History   Occupation: retired Garment/textile technologist: RETIRED  Tobacco Use   Smoking status: Never   Smokeless tobacco: Never   Tobacco comments:    NEVER USED TOBACCO  Vaping Use   Vaping status: Never Used  Substance and Sexual Activity   Alcohol use: No    Alcohol/week: 0.0 standard drinks of alcohol   Drug use: No   Sexual activity: Yes    Partners: Female  Other Topics Concern   Not on file  Social History Narrative   Marriedfor 52 years    Daughter, Physicist, medical (pediatrician--lives in [place], [place]), one in [place] - Warden/ranger. One in [place] - Chartered loss adjuster          Social Determinants of Health   Financial Resource Strain: Low Risk  (06/11/2022)   Overall Financial Resource Strain (CARDIA)    Difficulty of Paying Living Expenses: Not hard at all  Food Insecurity: No Food Insecurity (09/03/2022)   Hunger Vital Sign    Worried About Running Out of Food in the Last Year: Never true  Ran Out of Food in the Last Year: Never true  Transportation Needs: No Transportation Needs (09/03/2022)   PRAPARE - Administrator, Civil Service (Medical): No    Lack of Transportation (Non-Medical): No  Physical Activity: Sufficiently Active (06/11/2022)   Exercise Vital Sign    Days of Exercise per Week: 7 days    Minutes of Exercise per Session: 90 min  Stress: No Stress Concern Present (06/11/2022)   [employer] of Occupational Health - Occupational Stress Questionnaire    Feeling of Stress : Not at all  Social Connections: Socially Integrated (06/11/2022)   Social Connection and Isolation Panel [NHANES]    Frequency of Communication with Friends and Family: More than three times a week     Frequency of Social Gatherings with Friends and Family: Twice a week    Attends Religious Services: More than 4 times per year    Active Member of [employer] or Organizations: Yes    Attends Banker Meetings: More than 4 times per year    Marital Status: Married  Catering manager Violence: Not At Risk (09/03/2022)   Humiliation, Afraid, Rape, and Kick questionnaire    Fear of Current or Ex-Partner: No    Emotionally Abused: No    Physically Abused: No    Sexually Abused: No    Past Surgical History:  Procedure Laterality Date   BILIARY STENT PLACEMENT N/A 09/02/2022   Procedure: BILIARY STENT PLACEMENT;  Surgeon: Hilarie Fredrickson, MD;  Location: [facility];  Service: Gastroenterology;  Laterality: N/A;   BIOPSY  09/11/2022   Procedure: BIOPSY;  Surgeon: Meridee Score Netty Starring., MD;  Location: [facility];  Service: Gastroenterology;;   CARDIAC CATHETERIZATION  05/20/2013   CATARACT EXTRACTION W/ INTRAOCULAR LENS  IMPLANT, BILATERAL Bilateral 2008   CYSTECTOMY  ~ 2000   " SEBACEOUS cyst removed from between shoulder blades"   ENDOSCOPIC RETROGRADE CHOLANGIOPANCREATOGRAPHY (ERCP) WITH PROPOFOL N/A 09/02/2022   Procedure: ENDOSCOPIC RETROGRADE CHOLANGIOPANCREATOGRAPHY (ERCP) WITH PROPOFOL;  Surgeon: Hilarie Fredrickson, MD;  Location: [facility];  Service: Gastroenterology;  Laterality: N/A;   ESOPHAGOGASTRODUODENOSCOPY N/A 09/11/2022   Procedure: ESOPHAGOGASTRODUODENOSCOPY (EGD);  Surgeon: Lemar Lofty., MD;  Location: [facility];  Service: Gastroenterology;  Laterality: N/A;   EUS N/A 09/11/2022   Procedure: UPPER ENDOSCOPIC ULTRASOUND (EUS) RADIAL;  Surgeon: Lemar Lofty., MD;  Location: [facility];  Service: Gastroenterology;  Laterality: N/A;   EXCISIONAL HEMORRHOIDECTOMY  1970's   FINE NEEDLE ASPIRATION N/A 09/11/2022   Procedure: FINE NEEDLE ASPIRATION (FNA) LINEAR;  Surgeon: Lemar Lofty., MD;  Location: [facility];  Service: Gastroenterology;   Laterality: N/A;   INGUINAL HERNIA REPAIR Right ~ 2010   LEFT HEART CATHETERIZATION WITH CORONARY ANGIOGRAM N/A 05/20/2013   Procedure: LEFT HEART CATHETERIZATION WITH CORONARY ANGIOGRAM;  Surgeon: Peter M Swaziland, MD;  Location[facility];  Service: Cardiovascular;  Laterality: N/A;   SPHINCTEROTOMY  09/02/2022   Procedure: SPHINCTEROTOMY;  Surgeon: Hilarie Fredrickson, MD;  Location: [facility];  Service: Gastroenterology;;   TONSILLECTOMY AND ADENOIDECTOMY  1940's   VASECTOMY      Family History  Problem Relation Age of Onset   Colon cancer Mother        died at 71, colorectal   Diabetes Mother    Heart disease Father 46       MI   Diabetes Father    Stomach cancer Maternal Grandfather    Cancer Maternal Uncle     Allergies  Allergen Reactions  Metformin And Related Diarrhea   Metformin Hcl Diarrhea    Current Outpatient Medications on File Prior to Visit  Medication Sig Dispense Refill   Accu-Chek Softclix Lancets lancets Use to test blood glucose once daily 100 each 3   amLODipine (NORVASC) 10 MG tablet TAKE ONE TABLET BY MOUTH DAILY 90 tablet 3   Blood Glucose Calibration (EMBRACE PRO GLUCOSE CONTROL) LIQD Use to check blood glucose TID 1 each    Blood Glucose Monitoring Suppl (ACCU-CHEK AVIVA PLUS) w/Device KIT Use to test blood glucose once daily 1 kit 0   Continuous Glucose Sensor (FREESTYLE LIBRE 3 SENSOR) MISC 1 Device by Does not apply route every 14 (fourteen) days. Place 1 sensor on the skin every 14 days. Use to check glucose continuously 2 each 6   glucose blood (EMBRACE PRO GLUCOSE TEST) test strip Use as instructed 100 each 12   lisinopril (ZESTRIL) 10 MG tablet Take 10 mg by mouth daily.     pantoprazole (PROTONIX) 40 MG tablet Take 1 tablet (40 mg total) by mouth 2 (two) times daily before a meal. 60 tablet 6   No current facility-administered medications on file prior to visit.    BP 122/80   Pulse 78   Temp 97.8 F (36.6 C) (Oral)   Ht 5\' 11"  (1.803 m)    Wt 139 lb (63 kg)   SpO2 99%   BMI 19.39 kg/m       Objective:   Physical Exam Vitals and nursing note reviewed.  Constitutional:      Appearance: Normal appearance.  Cardiovascular:     Rate and Rhythm: Normal rate and regular rhythm.     Pulses: Normal pulses.     Heart sounds: Normal heart sounds.  Neurological:     General: No focal deficit present.     Mental Status: He is alert and oriented to person, place, and time.  Psychiatric:        Mood and Affect: Mood normal.        Behavior: Behavior normal.        Thought Content: Thought content normal.        Judgment: Judgment normal.       Assessment & Plan:  1. Diabetes mellitus treated with oral medication (HCC) - Will increase to 5 mg XR BID. He will send me results in the next 7 - 10 days to let me know how he is doing  - Can consider long acting insulin if needed - glipiZIDE (GLUCOTROL XL) 5 MG 24 hr tablet; Take 1 tablet (5 mg total) by mouth 2 (two) times daily.  Dispense: 180 tablet; Refill: 0  2. Primary hypertension - Controlled. No change in medication   Shirline Frees, NP

## 2022-09-28 ENCOUNTER — Ambulatory Visit: Payer: Medicare HMO | Attending: Cardiology

## 2022-09-28 DIAGNOSIS — Z01810 Encounter for preprocedural cardiovascular examination: Secondary | ICD-10-CM

## 2022-09-28 NOTE — Progress Notes (Signed)
Virtual Visit via Telephone Note   Because of [employer] co-morbid illnesses, he is at least at moderate risk for complications without adequate follow up.  This format is felt to be most appropriate for this patient at this time.  The patient did not have access to video technology/had technical difficulties with video requiring transitioning to audio format only (telephone).  All issues noted in this document were discussed and addressed.  No physical exam could be performed with this format.  Please refer to the patient's chart for his consent to telehealth for [facility].  Evaluation Performed:  Preoperative cardiovascular risk assessment _____________   Date:  09/28/2022   Patient ID:  Alexander [name], DOB 1940-01-04, MRN [id number] Patient Location:  Home Provider location:   Office  Primary Care Provider:  Shirline Frees, NP Primary Cardiologist:  Donato Schultz, MD  Chief Complaint / Patient Profile   82 y.o. y/o male with a h/o CAD, hypertension, hyperlipidemia, polycythemia vera who is pending [facility] procedure and presents today for telephonic preoperative cardiovascular risk assessment.  History of Present Illness    Alexander [name] is a 82 y.o. male who presents via audio/video conferencing for a telehealth visit today.  Pt was last seen in cardiology clinic on 06/02/2022 by Robin Searing, NP.  At that time Alexander [name] was doing well .  The patient is now pending procedure as outlined above. Since his last visit, he remains stable from a cardiac standpoint.  Today he denies chest pain, shortness of breath, lower extremity edema, fatigue, palpitations, melena, hematuria, hemoptysis, diaphoresis, weakness, presyncope, syncope, orthopnea, and PND.   Past Medical History    Past Medical History:  Diagnosis Date   Arthritis    "minor; shoulders primarily" (05/20/2013)   CAD (coronary artery disease)    a. 05/2013 - Chest pain/unstable angina  and dynamic EKG changes showing cath showing atherosclerotic coronary artery disease manifested as diffuse ectasia, normal EF.   Diverticulosis of colon    GERD (gastroesophageal reflux disease)    GI bleed    secondary to Aspirin   Gout    Hematuria, microscopic    Hemorrhoids    Hyperglycemia    Hyperlipidemia    Hypertension    Lumbar back pain    Microalbuminuria    Overweight(278.02)    Pancreatic cancer (HCC) 09/22/2022   Polycythemia rubra vera (HCC)    Tubular adenoma of colon 07/2012   Past Surgical History:  Procedure Laterality Date   BILIARY STENT PLACEMENT N/A 09/02/2022   Procedure: BILIARY STENT PLACEMENT;  Surgeon: Hilarie Fredrickson, MD;  Location: [facility];  Service: Gastroenterology;  Laterality: N/A;   BIOPSY  09/11/2022   Procedure: BIOPSY;  Surgeon: Meridee Score Netty Starring., MD;  Location: [facility];  Service: Gastroenterology;;   CARDIAC CATHETERIZATION  05/20/2013   CATARACT EXTRACTION W/ INTRAOCULAR LENS  IMPLANT, BILATERAL Bilateral 2008   CYSTECTOMY  ~ 2000   " SEBACEOUS cyst removed from between shoulder blades"   ENDOSCOPIC RETROGRADE CHOLANGIOPANCREATOGRAPHY (ERCP) WITH PROPOFOL N/A 09/02/2022   Procedure: ENDOSCOPIC RETROGRADE CHOLANGIOPANCREATOGRAPHY (ERCP) WITH PROPOFOL;  Surgeon: Hilarie Fredrickson, MD;  Location: [facility];  Service: Gastroenterology;  Laterality: N/A;   ESOPHAGOGASTRODUODENOSCOPY N/A 09/11/2022   Procedure: ESOPHAGOGASTRODUODENOSCOPY (EGD);  Surgeon: Lemar Lofty., MD;  Location: [facility];  Service: Gastroenterology;  Laterality: N/A;   EUS N/A 09/11/2022   Procedure: UPPER ENDOSCOPIC ULTRASOUND (EUS) RADIAL;  Surgeon: Lemar Lofty., MD;  Location: [facility];  Service: Gastroenterology;  Laterality: N/A;   EXCISIONAL HEMORRHOIDECTOMY  1970's   FINE NEEDLE ASPIRATION N/A 09/11/2022   Procedure: FINE NEEDLE ASPIRATION (FNA) LINEAR;  Surgeon: Lemar Lofty., MD;  Location: [facility];  Service:  Gastroenterology;  Laterality: N/A;   INGUINAL HERNIA REPAIR Right ~ 2010   LEFT HEART CATHETERIZATION WITH CORONARY ANGIOGRAM N/A 05/20/2013   Procedure: LEFT HEART CATHETERIZATION WITH CORONARY ANGIOGRAM;  Surgeon: Peter M Swaziland, MD;  Location: [facility] CATH LAB;  Service: Cardiovascular;  Laterality: N/A;   SPHINCTEROTOMY  09/02/2022   Procedure: SPHINCTEROTOMY;  Surgeon: Hilarie Fredrickson, MD;  Location: [facility];  Service: Gastroenterology;;   TONSILLECTOMY AND ADENOIDECTOMY  1940's   VASECTOMY      Allergies  Allergies  Allergen Reactions   Metformin And Related Diarrhea   Metformin Hcl Diarrhea    Home Medications    Prior to Admission medications   Medication Sig Start Date End Date Taking? Authorizing Provider  Accu-Chek Softclix Lancets lancets Use to test blood glucose once daily 11/09/21   Nafziger, Kandee Keen, NP  amLODipine (NORVASC) 10 MG tablet TAKE ONE TABLET BY MOUTH DAILY 03/21/22   Nafziger, Kandee Keen, NP  Blood Glucose Calibration (EMBRACE PRO GLUCOSE CONTROL) LIQD Use to check blood glucose TID 04/19/22   Nafziger, Kandee Keen, NP  Blood Glucose Monitoring Suppl (ACCU-CHEK AVIVA PLUS) w/Device KIT Use to test blood glucose once daily 12/11/17   Nafziger, Kandee Keen, NP  Continuous Glucose Sensor (FREESTYLE LIBRE 3 SENSOR) MISC 1 Device by Does not apply route every 14 (fourteen) days. Place 1 sensor on the skin every 14 days. Use to check glucose continuously 09/14/22   Nafziger, Kandee Keen, NP  glipiZIDE (GLUCOTROL XL) 5 MG 24 hr tablet Take 1 tablet (5 mg total) by mouth 2 (two) times daily. 09/27/22 12/26/22  Nafziger, Kandee Keen, NP  glucose blood (EMBRACE PRO GLUCOSE TEST) test strip Use as instructed 04/19/22   Nafziger, Kandee Keen, NP  lisinopril (ZESTRIL) 10 MG tablet Take 10 mg by mouth daily.    [provider]  pantoprazole (PROTONIX) 40 MG tablet Take 1 tablet (40 mg total) by mouth 2 (two) times daily before a meal. 09/11/22   Mansouraty, Netty Starring., MD    Physical Exam    Vital Signs:   Alexander [name] does not have vital signs available for review today.  Given telephonic nature of communication, physical exam is limited. AAOx3. NAD. Normal affect.  Speech and respirations are unlabored.  Accessory Clinical Findings    None  Assessment & Plan    1.  Preoperative Cardiovascular Risk Assessment:  [facility] ,  Dr. Almond Lint , CCS, Fax number:  [phone]       Primary Cardiologist: Donato Schultz, MD  Chart reviewed as part of pre-operative protocol coverage. Given past medical history and time since last visit, based on ACC/AHA guidelines, MARCANTHONY BODIN would be at acceptable risk for the planned procedure without further cardiovascular testing.   His RCRI is a class II risk, 0.9% risk of major cardiac event.  He is able to complete greater than 4 METS of physical activity.  Patient was advised that if he develops new symptoms prior to surgery to contact our office to arrange a follow-up appointment.  He verbalized understanding.  He may hold Aspirin for 5-7 days prior to procedure. Please resume Aspirin as soon as possible postprocedure, at the discretion of the surgeon.   I will route this recommendation to the requesting party via Epic fax function and remove from pre-op pool.  Time:   Today, I have spent 5 minutes with the patient with telehealth technology discussing medical history, symptoms, and management plan.  Prior to patient's phone evaluation I spent greater than 10 minutes reviewing their past medical history and cardiac medications.    Ronney Asters, NP  09/28/2022, 7:31 AM

## 2022-10-04 ENCOUNTER — Other Ambulatory Visit: Payer: Self-pay | Admitting: Adult Health

## 2022-10-04 ENCOUNTER — Encounter: Payer: Self-pay | Admitting: Adult Health

## 2022-10-04 ENCOUNTER — Ambulatory Visit (HOSPITAL_COMMUNITY)
Admission: RE | Admit: 2022-10-04 | Discharge: 2022-10-04 | Disposition: A | Discharge status: Home / Self Care | Payer: Medicare HMO | Source: Ambulatory Visit | Attending: Hematology & Oncology | Admitting: Hematology & Oncology

## 2022-10-04 DIAGNOSIS — R634 Abnormal weight loss: Secondary | ICD-10-CM | POA: Diagnosis present

## 2022-10-04 DIAGNOSIS — C250 Malignant neoplasm of head of pancreas: Secondary | ICD-10-CM | POA: Diagnosis present

## 2022-10-04 MED ORDER — IOHEXOL 300 MG/ML  SOLN
75.0000 mL | Freq: Once | INTRAMUSCULAR | Status: AC | PRN
  Administered 2022-10-04: 75 mL via INTRAVENOUS

## 2022-10-04 MED ORDER — SODIUM CHLORIDE (PF) 0.9 % IJ SOLN
INTRAMUSCULAR | Status: AC
  Filled 2022-10-04: qty 50

## 2022-10-04 MED ORDER — GLIPIZIDE ER 10 MG PO TB24: 10.0000 mg | ORAL_TABLET | Freq: Two times a day (BID) | ORAL | 0 refills | Status: DC

## 2022-10-04 NOTE — Telephone Encounter (Signed)
Please advise 

## 2022-10-05 ENCOUNTER — Encounter: Payer: Self-pay | Admitting: Hematology & Oncology

## 2022-10-05 ENCOUNTER — Other Ambulatory Visit: Payer: Self-pay | Admitting: Hematology & Oncology

## 2022-10-05 DIAGNOSIS — K8689 Other specified diseases of pancreas: Secondary | ICD-10-CM

## 2022-10-05 MED ORDER — PANCRELIPASE (LIP-PROT-AMYL) 36000-114000 UNITS PO CPEP
ORAL_CAPSULE | ORAL | 0 refills | Status: DC
Start: 2022-10-05 — End: 2022-10-19

## 2022-10-09 ENCOUNTER — Inpatient Hospital Stay: Payer: Medicare HMO | Attending: Hematology & Oncology

## 2022-10-09 ENCOUNTER — Encounter: Payer: Self-pay | Admitting: *Deleted

## 2022-10-09 ENCOUNTER — Inpatient Hospital Stay (HOSPITAL_BASED_OUTPATIENT_CLINIC_OR_DEPARTMENT_OTHER): Payer: Medicare HMO | Admitting: Hematology & Oncology

## 2022-10-09 ENCOUNTER — Encounter: Payer: Self-pay | Admitting: Adult Health

## 2022-10-09 ENCOUNTER — Encounter: Payer: Self-pay | Admitting: Hematology & Oncology

## 2022-10-09 VITALS — BP 142/74 | HR 57 | Temp 98.8°F | Resp 17 | Wt 136.0 lb

## 2022-10-09 DIAGNOSIS — D45 Polycythemia vera: Secondary | ICD-10-CM | POA: Insufficient documentation

## 2022-10-09 DIAGNOSIS — C259 Malignant neoplasm of pancreas, unspecified: Secondary | ICD-10-CM | POA: Diagnosis present

## 2022-10-09 DIAGNOSIS — R17 Unspecified jaundice: Secondary | ICD-10-CM | POA: Diagnosis not present

## 2022-10-09 DIAGNOSIS — C250 Malignant neoplasm of head of pancreas: Secondary | ICD-10-CM

## 2022-10-09 DIAGNOSIS — Z5111 Encounter for antineoplastic chemotherapy: Secondary | ICD-10-CM | POA: Insufficient documentation

## 2022-10-09 DIAGNOSIS — E119 Type 2 diabetes mellitus without complications: Secondary | ICD-10-CM | POA: Diagnosis not present

## 2022-10-09 DIAGNOSIS — Z79899 Other long term (current) drug therapy: Secondary | ICD-10-CM | POA: Diagnosis not present

## 2022-10-09 LAB — CMP (CANCER CENTER ONLY)
ALT: 35 U/L (ref 0–44)
AST: 17 U/L (ref 15–41)
Albumin: 3.3 g/dL — ABNORMAL LOW (ref 3.5–5.0)
Alkaline Phosphatase: 442 U/L — ABNORMAL HIGH (ref 38–126)
Anion gap: 7 (ref 5–15)
BUN: 19 mg/dL (ref 8–23)
CO2: 28 mmol/L (ref 22–32)
Calcium: 8.8 mg/dL — ABNORMAL LOW (ref 8.9–10.3)
Chloride: 99 mmol/L (ref 98–111)
Creatinine: 1.08 mg/dL (ref 0.61–1.24)
GFR, Estimated: >60 mL/min (ref >60)
Glucose, Bld: 403 mg/dL — ABNORMAL HIGH (ref 70–99)
Potassium: 3.9 mmol/L (ref 3.5–5.1)
Sodium: 134 mmol/L — ABNORMAL LOW (ref 135–145)
Total Bilirubin: 1.6 mg/dL — ABNORMAL HIGH (ref 0.3–1.2)
Total Protein: 6.3 g/dL — ABNORMAL LOW (ref 6.5–8.1)

## 2022-10-09 LAB — CBC WITH DIFFERENTIAL (CANCER CENTER ONLY)
Abs Immature Granulocytes: 0.06 10*3/uL (ref 0.00–0.07)
Basophils Absolute: 0 10*3/uL (ref 0.0–0.1)
Basophils Relative: 1 %
Eosinophils Absolute: 0.1 10*3/uL (ref 0.0–0.5)
Eosinophils Relative: 1 %
HCT: 39.7 % (ref 39.0–52.0)
Hemoglobin: 13.5 g/dL (ref 13.0–17.0)
Immature Granulocytes: 1 %
Lymphocytes Relative: 17 %
Lymphs Abs: 1.4 10*3/uL (ref 0.7–4.0)
MCH: 32.8 pg (ref 26.0–34.0)
MCHC: 34 g/dL (ref 30.0–36.0)
MCV: 96.6 fL (ref 80.0–100.0)
Monocytes Absolute: 0.8 10*3/uL (ref 0.1–1.0)
Monocytes Relative: 10 %
Neutro Abs: 5.8 10*3/uL (ref 1.7–7.7)
Neutrophils Relative %: 70 %
Platelet Count: 249 10*3/uL (ref 150–400)
RBC: 4.11 MIL/uL — ABNORMAL LOW (ref 4.22–5.81)
RDW: 13.7 % (ref 11.5–15.5)
WBC Count: 8.2 10*3/uL (ref 4.0–10.5)
nRBC: 0 % (ref 0.0–0.2)

## 2022-10-09 LAB — PREALBUMIN: Prealbumin: 19 mg/dL (ref 18–38)

## 2022-10-09 NOTE — Progress Notes (Signed)
Hematology and Oncology Follow Up Visit  Alexander [name] [id number] 03-26-1940 82 y.o. 10/09/2022   Principle Diagnosis:  Adenocarcinoma of the pancreas-stage Ib (T2N0M0) --clinical stage --BRCA (-), KRAS (+), HRD(-)  Current Therapy:   Status post biliary stent placement     Interim History:  Alexander [name] is back for follow-up.  So far, he is still in the process of getting evaluated for his pancreatic cancer.  Looks like he has a localized pancreatic cancer.  He did see the head Surgical Oncologist at [facility]  Dr. Selena Batten felt that he would benefit from neoadjuvant chemotherapy.  Alexander [name] will be seen at [facility] for evaluation of systemic chemotherapy.  I am so happy that his bilirubin has come down nicely.  Today, his bilirubin is 1.6.[name]  The problem is that he is just not not gained a lot of weight.  He is hungry.  He says whenever he eats he has a metallic taste in his mouth.  I told him to try to rinse with water and baking soda before he eats.  This might help.  Of note, his last CA 19-9 was 239.  He has had no abdominal pain.  He has had no problem bowels or bladder.  The real problem that he has his blood sugars are incredibly high.  He clearly is diabetic.  He has been managed by his family doctor.  I suspect that he is probably going to have to be on insulin.  His blood sugar today is 403.  I think this is going to help prevent him from gaining a lot of weight.  He has had no bleeding.  He has had no fever.  He has had no cough or shortness of breath.  Overall, I would say that his performance status is probably ECOG 1.   Medications:  Current Outpatient Medications:    Accu-Chek Softclix Lancets lancets, Use to test blood glucose once daily, Disp: 100 each, Rfl: 3   Continuous Glucose Sensor (FREESTYLE LIBRE 3 SENSOR) MISC, 1 Device by Does not apply route every 14 (fourteen) days. Place 1 sensor on the skin every 14 days. Use to check glucose  continuously, Disp: 2 each, Rfl: 6   glipiZIDE (GLUCOTROL XL) 10 MG 24 hr tablet, Take 1 tablet (10 mg total) by mouth 2 (two) times daily., Disp: 180 tablet, Rfl: 0   glucose blood (EMBRACE PRO GLUCOSE TEST) test strip, Use as instructed, Disp: 100 each, Rfl: 12   lipase/protease/amylase (CREON) 36000 UNITS CPEP capsule, Take 2 capsules (72,000 Units total) by mouth 3 (three) times daily with meals. May also take 1 capsule (36,000 Units total) as needed (with snacks - up to 4 snacks daily)., Disp: 180 capsule, Rfl: 0   pantoprazole (PROTONIX) 40 MG tablet, Take 1 tablet (40 mg total) by mouth 2 (two) times daily before a meal., Disp: 60 tablet, Rfl: 6   amLODipine (NORVASC) 10 MG tablet, TAKE ONE TABLET BY MOUTH DAILY, Disp: 90 tablet, Rfl: 3   Blood Glucose Calibration (EMBRACE PRO GLUCOSE CONTROL) LIQD, Use to check blood glucose TID (Patient not taking: Reported on 10/09/2022), Disp: 1 each, Rfl:    Blood Glucose Monitoring Suppl (ACCU-CHEK AVIVA PLUS) w/Device KIT, Use to test blood glucose once daily (Patient not taking: Reported on 10/09/2022), Disp: 1 kit, Rfl: 0   lisinopril (ZESTRIL) 10 MG tablet, Take 10 mg by mouth daily., Disp: , Rfl:   Allergies:  Allergies  Allergen Reactions   Metformin And Related Diarrhea  Metformin Hcl Diarrhea    Past Medical History, Surgical history, Social history, and Family History were reviewed and updated.  Review of Systems: Review of Systems  Constitutional:  Positive for unexpected weight change.  HENT:  Negative.    Eyes:  Positive for icterus.  Respiratory: Negative.    Cardiovascular: Negative.   Gastrointestinal: Negative.   Endocrine: Negative.   Genitourinary: Negative.    Musculoskeletal: Negative.   Skin:  Positive for rash.  Neurological: Negative.   Hematological: Negative.   Psychiatric/Behavioral: Negative.      Physical Exam:  weight is 136 lb (61.7 kg). His oral temperature is 98.8 F (37.1 C). His blood pressure is  142/74 (abnormal) and his pulse is 57 (abnormal). His respiration is 17 and oxygen saturation is 100%.   Wt Readings from Last 3 Encounters:  10/09/22 136 lb (61.7 kg)  09/27/22 139 lb (63 kg)  09/22/22 136 lb 0.6 oz (61.7 kg)    Physical Exam Vitals reviewed.  HENT:     Head: Normocephalic and atraumatic.  Eyes:     Pupils: Pupils are equal, round, and reactive to light.     Comments: Ocular exam does show some slight scleral icterus.  Cardiovascular:     Rate and Rhythm: Normal rate and regular rhythm.     Heart sounds: Normal heart sounds.  Pulmonary:     Effort: Pulmonary effort is normal.     Breath sounds: Normal breath sounds.  Abdominal:     General: Bowel sounds are normal.     Palpations: Abdomen is soft.     Comments: Abdominal exam is soft.  He has good bowel sounds.  There is no fluid wave.  There is no obvious abdominal mass.  There is no palpable hepatosplenomegaly.  Musculoskeletal:        General: No tenderness or deformity. Normal range of motion.     Cervical back: Normal range of motion.  Lymphadenopathy:     Cervical: No cervical adenopathy.  Skin:    General: Skin is warm and dry.     Findings: No erythema or rash.     Comments: Skin exam does not show any obvious rashes.  He has some jaundice.  Neurological:     Mental Status: He is alert and oriented to person, place, and time.  Psychiatric:        Behavior: Behavior normal.        Thought Content: Thought content normal.        Judgment: Judgment normal.     Lab Results  Component Value Date   WBC 8.2 10/09/2022   HGB 13.5 10/09/2022   HCT 39.7 10/09/2022   MCV 96.6 10/09/2022   PLT 249 10/09/2022     Chemistry      Component Value Date/Time   NA 134 (L) 10/09/2022 1443   NA 144 11/29/2016 1054   NA 139 12/29/2015 0954   K 3.9 10/09/2022 1443   K 4.2 11/29/2016 1054   K 4.1 12/29/2015 0954   CL 99 10/09/2022 1443   CL 102 11/29/2016 1054   CO2 28 10/09/2022 1443   CO2 30  11/29/2016 1054   CO2 26 12/29/2015 0954   BUN 19 10/09/2022 1443   BUN 22 11/29/2016 1054   BUN 20.0 12/29/2015 0954   CREATININE 1.08 10/09/2022 1443   CREATININE 1.2 11/29/2016 1054   CREATININE 1.3 12/29/2015 0954      Component Value Date/Time   CALCIUM 8.8 (L) 10/09/2022 1443  CALCIUM 9.3 11/29/2016 1054   CALCIUM 9.5 12/29/2015 0954   ALKPHOS 442 (H) 10/09/2022 1443   ALKPHOS 92 (H) 11/29/2016 1054   ALKPHOS 98 12/29/2015 0954   AST 17 10/09/2022 1443   AST 16 12/29/2015 0954   ALT 35 10/09/2022 1443   ALT 20 11/29/2016 1054   ALT 13 12/29/2015 0954   BILITOT 1.6 (H) 10/09/2022 1443   BILITOT 0.71 12/29/2015 0954      Impression and Plan: Mr. Meah is a very nice 82 year old white male.  Again, we have been seeing him for polycythemia vera.  Now, his problem is he has early stage pancreatic cancer.  We will have to see what [facility] says with his failure to his chemotherapy.  I told him that we would be more and happy to treat him locally.  However, Dr. Selena Batten feels that he would be best served being treated at [facility].  This is quite a distance for Mr. Garnier to drive.  He will talk with the oncologist at [facility] to see if he can get treatment in our office.  Again, I had to believe that he is going need to have insulin to help with his blood sugars to be under better control.  Of note, he says that Creon does help with him.  He does help with absorption.  We will be more happy to get him back whenever.  I told him that I would not schedule appointment for him until we hear from [facility] as to whether or not we need to treat him here.   He is going to see Dr. Selena Batten at [facility].  He will be interesting to see what he has to say about this.  Josph Macho, MD 10/7/20243:30 PM

## 2022-10-10 ENCOUNTER — Other Ambulatory Visit: Payer: Self-pay | Admitting: Adult Health

## 2022-10-10 ENCOUNTER — Encounter: Payer: Self-pay | Admitting: Hematology & Oncology

## 2022-10-10 DIAGNOSIS — E1169 Type 2 diabetes mellitus with other specified complication: Secondary | ICD-10-CM

## 2022-10-10 LAB — CANCER ANTIGEN 19-9: CA 19-9: 281 U/mL — ABNORMAL HIGH (ref 0–35)

## 2022-10-10 MED ORDER — INSULIN PEN NEEDLE 29G X 5MM MISC
1 refills | Status: DC
Start: 2022-10-10 — End: 2022-12-14

## 2022-10-10 MED ORDER — INSULIN GLARGINE 100 UNIT/ML ~~LOC~~ SOLN: 10.0000 [IU] | Freq: Every day | SUBCUTANEOUS | 1 refills | Status: DC

## 2022-10-10 NOTE — Telephone Encounter (Signed)
Please advise 

## 2022-10-10 NOTE — Progress Notes (Signed)
Patient has decided that he would like to be treated at [facility]. He is currently working with Ambulance person and has an appointment with their medical oncologist this week. Will discontinue active navigation at this time, but be available as needed in the future.   Oncology Nurse Navigator Documentation     10/09/2022    3:00 PM  Oncology Nurse Navigator Flowsheets  Navigation Complete Date: 10/09/2022  Post Navigation: Continue to Follow Patient? No  Reason Not Navigating Patient: Seeking Care elsewhere  Navigator Location [facility]  Navigator Encounter Type Appt/Treatment Plan Review  Patient Visit Type MedOnc  Treatment Phase Pre-Tx/Tx Discussion  Barriers/Navigation Needs Coordination of Care  Interventions None Required  Acuity Level 2-Minimal Needs (1-2 Barriers Identified)  Support Groups/Services Friends and Family  Time Spent with Patient 15

## 2022-10-12 ENCOUNTER — Other Ambulatory Visit: Payer: Self-pay

## 2022-10-12 ENCOUNTER — Inpatient Hospital Stay (HOSPITAL_COMMUNITY)
Admission: EM | Admit: 2022-10-12 | Discharge: 2022-10-14 | DRG: 919 | Disposition: A | Discharge status: Home / Self Care | Payer: Medicare HMO | Attending: Internal Medicine | Admitting: Internal Medicine

## 2022-10-12 ENCOUNTER — Encounter (HOSPITAL_COMMUNITY): Payer: Self-pay

## 2022-10-12 DIAGNOSIS — Z800 Family history of malignant neoplasm of digestive organs: Secondary | ICD-10-CM

## 2022-10-12 DIAGNOSIS — C259 Malignant neoplasm of pancreas, unspecified: Secondary | ICD-10-CM | POA: Diagnosis not present

## 2022-10-12 DIAGNOSIS — Y732 Prosthetic and other implants, materials and accessory gastroenterology and urology devices associated with adverse incidents: Secondary | ICD-10-CM | POA: Diagnosis present

## 2022-10-12 DIAGNOSIS — Z79899 Other long term (current) drug therapy: Secondary | ICD-10-CM

## 2022-10-12 DIAGNOSIS — R918 Other nonspecific abnormal finding of lung field: Secondary | ICD-10-CM | POA: Diagnosis present

## 2022-10-12 DIAGNOSIS — T85590A Other mechanical complication of bile duct prosthesis, initial encounter: Principal | ICD-10-CM | POA: Diagnosis present

## 2022-10-12 DIAGNOSIS — Z961 Presence of intraocular lens: Secondary | ICD-10-CM | POA: Diagnosis present

## 2022-10-12 DIAGNOSIS — Z9842 Cataract extraction status, left eye: Secondary | ICD-10-CM

## 2022-10-12 DIAGNOSIS — E785 Hyperlipidemia, unspecified: Secondary | ICD-10-CM | POA: Diagnosis present

## 2022-10-12 DIAGNOSIS — Z9841 Cataract extraction status, right eye: Secondary | ICD-10-CM

## 2022-10-12 DIAGNOSIS — Z888 Allergy status to other drugs, medicaments and biological substances status: Secondary | ICD-10-CM

## 2022-10-12 DIAGNOSIS — Z794 Long term (current) use of insulin: Secondary | ICD-10-CM

## 2022-10-12 DIAGNOSIS — Z8249 Family history of ischemic heart disease and other diseases of the circulatory system: Secondary | ICD-10-CM

## 2022-10-12 DIAGNOSIS — I10 Essential (primary) hypertension: Secondary | ICD-10-CM | POA: Diagnosis present

## 2022-10-12 DIAGNOSIS — K219 Gastro-esophageal reflux disease without esophagitis: Secondary | ICD-10-CM | POA: Diagnosis present

## 2022-10-12 DIAGNOSIS — Z833 Family history of diabetes mellitus: Secondary | ICD-10-CM

## 2022-10-12 DIAGNOSIS — K8309 Other cholangitis: Secondary | ICD-10-CM | POA: Diagnosis present

## 2022-10-12 DIAGNOSIS — I2510 Atherosclerotic heart disease of native coronary artery without angina pectoris: Secondary | ICD-10-CM | POA: Diagnosis present

## 2022-10-12 DIAGNOSIS — C250 Malignant neoplasm of head of pancreas: Secondary | ICD-10-CM | POA: Diagnosis present

## 2022-10-12 DIAGNOSIS — E1165 Type 2 diabetes mellitus with hyperglycemia: Secondary | ICD-10-CM | POA: Diagnosis present

## 2022-10-12 DIAGNOSIS — Z860101 Personal history of adenomatous and serrated colon polyps: Secondary | ICD-10-CM

## 2022-10-12 DIAGNOSIS — K831 Obstruction of bile duct: Principal | ICD-10-CM | POA: Diagnosis present

## 2022-10-12 DIAGNOSIS — M109 Gout, unspecified: Secondary | ICD-10-CM | POA: Diagnosis present

## 2022-10-12 DIAGNOSIS — Z7984 Long term (current) use of oral hypoglycemic drugs: Secondary | ICD-10-CM

## 2022-10-12 LAB — CBC WITH DIFFERENTIAL/PLATELET
Abs Immature Granulocytes: 0.12 10*3/uL — ABNORMAL HIGH (ref 0.00–0.07)
Basophils Absolute: 0 10*3/uL (ref 0.0–0.1)
Basophils Relative: 0 %
Eosinophils Absolute: 0 10*3/uL (ref 0.0–0.5)
Eosinophils Relative: 0 %
HCT: 39.3 % (ref 39.0–52.0)
Hemoglobin: 13.4 g/dL (ref 13.0–17.0)
Immature Granulocytes: 1 %
Lymphocytes Relative: 3 %
Lymphs Abs: 0.5 10*3/uL — ABNORMAL LOW (ref 0.7–4.0)
MCH: 33.3 pg (ref 26.0–34.0)
MCHC: 34.1 g/dL (ref 30.0–36.0)
MCV: 97.5 fL (ref 80.0–100.0)
Monocytes Absolute: 0.9 10*3/uL (ref 0.1–1.0)
Monocytes Relative: 4 %
Neutro Abs: 18.4 10*3/uL — ABNORMAL HIGH (ref 1.7–7.7)
Neutrophils Relative %: 92 %
Platelets: 243 10*3/uL (ref 150–400)
RBC: 4.03 MIL/uL — ABNORMAL LOW (ref 4.22–5.81)
RDW: 13.7 % (ref 11.5–15.5)
WBC: 20 10*3/uL — ABNORMAL HIGH (ref 4.0–10.5)
nRBC: 0 % (ref 0.0–0.2)

## 2022-10-12 NOTE — ED Triage Notes (Signed)
Pt arrived via EMS from home for upper gastric and upper back pain after 3 mile walk. Pt was recently diagnosed with pancreatic cancer. given with EMS Pt denies n/v/d

## 2022-10-13 ENCOUNTER — Inpatient Hospital Stay (HOSPITAL_COMMUNITY): Payer: Medicare HMO

## 2022-10-13 ENCOUNTER — Encounter (HOSPITAL_COMMUNITY): Payer: Self-pay | Admitting: Internal Medicine

## 2022-10-13 ENCOUNTER — Emergency Department (HOSPITAL_COMMUNITY): Payer: Medicare HMO

## 2022-10-13 ENCOUNTER — Inpatient Hospital Stay (HOSPITAL_COMMUNITY): Payer: Medicare HMO | Admitting: Anesthesiology

## 2022-10-13 ENCOUNTER — Encounter (HOSPITAL_COMMUNITY)
Admission: EM | Disposition: A | Discharge status: Home / Self Care | Payer: Self-pay | Source: Home / Self Care | Attending: Internal Medicine

## 2022-10-13 DIAGNOSIS — C259 Malignant neoplasm of pancreas, unspecified: Secondary | ICD-10-CM | POA: Diagnosis present

## 2022-10-13 DIAGNOSIS — Z800 Family history of malignant neoplasm of digestive organs: Secondary | ICD-10-CM | POA: Diagnosis not present

## 2022-10-13 DIAGNOSIS — C250 Malignant neoplasm of head of pancreas: Secondary | ICD-10-CM | POA: Diagnosis present

## 2022-10-13 DIAGNOSIS — M109 Gout, unspecified: Secondary | ICD-10-CM | POA: Diagnosis present

## 2022-10-13 DIAGNOSIS — K219 Gastro-esophageal reflux disease without esophagitis: Secondary | ICD-10-CM | POA: Diagnosis present

## 2022-10-13 DIAGNOSIS — Z794 Long term (current) use of insulin: Secondary | ICD-10-CM | POA: Diagnosis not present

## 2022-10-13 DIAGNOSIS — Z7984 Long term (current) use of oral hypoglycemic drugs: Secondary | ICD-10-CM | POA: Diagnosis not present

## 2022-10-13 DIAGNOSIS — Z833 Family history of diabetes mellitus: Secondary | ICD-10-CM | POA: Diagnosis not present

## 2022-10-13 DIAGNOSIS — Z9841 Cataract extraction status, right eye: Secondary | ICD-10-CM | POA: Diagnosis not present

## 2022-10-13 DIAGNOSIS — Z8249 Family history of ischemic heart disease and other diseases of the circulatory system: Secondary | ICD-10-CM | POA: Diagnosis not present

## 2022-10-13 DIAGNOSIS — E785 Hyperlipidemia, unspecified: Secondary | ICD-10-CM | POA: Diagnosis present

## 2022-10-13 DIAGNOSIS — I10 Essential (primary) hypertension: Secondary | ICD-10-CM | POA: Diagnosis present

## 2022-10-13 DIAGNOSIS — C61 Malignant neoplasm of prostate: Secondary | ICD-10-CM | POA: Diagnosis not present

## 2022-10-13 DIAGNOSIS — R7989 Other specified abnormal findings of blood chemistry: Secondary | ICD-10-CM

## 2022-10-13 DIAGNOSIS — R918 Other nonspecific abnormal finding of lung field: Secondary | ICD-10-CM | POA: Diagnosis present

## 2022-10-13 DIAGNOSIS — Z860101 Personal history of adenomatous and serrated colon polyps: Secondary | ICD-10-CM | POA: Diagnosis not present

## 2022-10-13 DIAGNOSIS — Z961 Presence of intraocular lens: Secondary | ICD-10-CM | POA: Diagnosis present

## 2022-10-13 DIAGNOSIS — I2510 Atherosclerotic heart disease of native coronary artery without angina pectoris: Secondary | ICD-10-CM

## 2022-10-13 DIAGNOSIS — Y732 Prosthetic and other implants, materials and accessory gastroenterology and urology devices associated with adverse incidents: Secondary | ICD-10-CM | POA: Diagnosis present

## 2022-10-13 DIAGNOSIS — Z888 Allergy status to other drugs, medicaments and biological substances status: Secondary | ICD-10-CM | POA: Diagnosis not present

## 2022-10-13 DIAGNOSIS — T85590A Other mechanical complication of bile duct prosthesis, initial encounter: Secondary | ICD-10-CM | POA: Diagnosis present

## 2022-10-13 DIAGNOSIS — Z9842 Cataract extraction status, left eye: Secondary | ICD-10-CM | POA: Diagnosis not present

## 2022-10-13 DIAGNOSIS — K831 Obstruction of bile duct: Secondary | ICD-10-CM

## 2022-10-13 DIAGNOSIS — K8309 Other cholangitis: Secondary | ICD-10-CM | POA: Diagnosis present

## 2022-10-13 DIAGNOSIS — Z79899 Other long term (current) drug therapy: Secondary | ICD-10-CM | POA: Diagnosis not present

## 2022-10-13 DIAGNOSIS — E1165 Type 2 diabetes mellitus with hyperglycemia: Secondary | ICD-10-CM | POA: Diagnosis present

## 2022-10-13 HISTORY — PX: BILIARY STENT PLACEMENT: SHX5538

## 2022-10-13 HISTORY — PX: STENT REMOVAL: SHX6421

## 2022-10-13 HISTORY — PX: ENDOSCOPIC RETROGRADE CHOLANGIOPANCREATOGRAPHY (ERCP) WITH PROPOFOL: SHX5810

## 2022-10-13 LAB — PROTIME-INR
INR: 1.3 — ABNORMAL HIGH (ref 0.8–1.2)
Prothrombin Time: 16.1 s — ABNORMAL HIGH (ref 11.4–15.2)

## 2022-10-13 LAB — COMPREHENSIVE METABOLIC PANEL
ALT: 100 U/L — ABNORMAL HIGH (ref 0–44)
AST: 100 U/L — ABNORMAL HIGH (ref 15–41)
Albumin: 3.4 g/dL — ABNORMAL LOW (ref 3.5–5.0)
Alkaline Phosphatase: 457 U/L — ABNORMAL HIGH (ref 38–126)
Anion gap: 8 (ref 5–15)
BUN: 23 mg/dL (ref 8–23)
CO2: 24 mmol/L (ref 22–32)
Calcium: 8.7 mg/dL — ABNORMAL LOW (ref 8.9–10.3)
Chloride: 102 mmol/L (ref 98–111)
Creatinine, Ser: 1.02 mg/dL (ref 0.61–1.24)
GFR, Estimated: >60 mL/min (ref >60)
Glucose, Bld: 234 mg/dL — ABNORMAL HIGH (ref 70–99)
Potassium: 3.5 mmol/L (ref 3.5–5.1)
Sodium: 134 mmol/L — ABNORMAL LOW (ref 135–145)
Total Bilirubin: 4.6 mg/dL — ABNORMAL HIGH (ref 0.3–1.2)
Total Protein: 6.6 g/dL (ref 6.5–8.1)

## 2022-10-13 LAB — URINALYSIS, ROUTINE W REFLEX MICROSCOPIC
Bacteria, UA: NONE SEEN
Bilirubin Urine: NEGATIVE
Glucose, UA: >=500 mg/dL — AB
Hgb urine dipstick: NEGATIVE
Ketones, ur: NEGATIVE mg/dL
Leukocytes,Ua: NEGATIVE
Nitrite: NEGATIVE
Protein, ur: NEGATIVE mg/dL
Specific Gravity, Urine: 1.036 — ABNORMAL HIGH (ref 1.005–1.030)
pH: 5 (ref 5.0–8.0)

## 2022-10-13 LAB — GLUCOSE, CAPILLARY
Glucose-Capillary: 98 mg/dL (ref 70–99)
Glucose-Capillary: 269 mg/dL — ABNORMAL HIGH (ref 70–99)

## 2022-10-13 LAB — TROPONIN I (HIGH SENSITIVITY)
Troponin I (High Sensitivity): 7 ng/L (ref <18)
Troponin I (High Sensitivity): 8 ng/L (ref <18)

## 2022-10-13 LAB — LIPASE, BLOOD: Lipase: 18 U/L (ref 11–51)

## 2022-10-13 LAB — CBG MONITORING, ED
Glucose-Capillary: 179 mg/dL — ABNORMAL HIGH (ref 70–99)
Glucose-Capillary: 115 mg/dL — ABNORMAL HIGH (ref 70–99)

## 2022-10-13 SURGERY — ENDOSCOPIC RETROGRADE CHOLANGIOPANCREATOGRAPHY (ERCP) WITH PROPOFOL
Anesthesia: General

## 2022-10-13 MED ORDER — LIDOCAINE 2% (20 MG/ML) 5 ML SYRINGE
INTRAMUSCULAR | Status: DC | PRN
  Administered 2022-10-13: 60 mg via INTRAVENOUS

## 2022-10-13 MED ORDER — ONDANSETRON HCL 4 MG PO TABS: 4.0000 mg | ORAL_TABLET | Freq: Four times a day (QID) | ORAL | Status: DC | PRN

## 2022-10-13 MED ORDER — ONDANSETRON HCL 4 MG/2ML IJ SOLN: 4.0000 mg | Freq: Four times a day (QID) | INTRAMUSCULAR | Status: DC | PRN

## 2022-10-13 MED ORDER — ONDANSETRON HCL 4 MG/2ML IJ SOLN
INTRAMUSCULAR | Status: DC | PRN
  Administered 2022-10-13: 4 mg via INTRAVENOUS

## 2022-10-13 MED ORDER — SUGAMMADEX SODIUM 200 MG/2ML IV SOLN
INTRAVENOUS | Status: DC | PRN
  Administered 2022-10-13: 200 mg via INTRAVENOUS

## 2022-10-13 MED ORDER — IBUPROFEN 200 MG PO TABS: 400.0000 mg | ORAL_TABLET | Freq: Four times a day (QID) | ORAL | Status: DC | PRN

## 2022-10-13 MED ORDER — DEXAMETHASONE SODIUM PHOSPHATE 10 MG/ML IJ SOLN
INTRAMUSCULAR | Status: DC | PRN
  Administered 2022-10-13: 10 mg via INTRAVENOUS

## 2022-10-13 MED ORDER — INSULIN GLARGINE-YFGN 100 UNIT/ML ~~LOC~~ SOLN
5.0000 [IU] | Freq: Every day | SUBCUTANEOUS | Status: DC
  Administered 2022-10-13 – 2022-10-14 (×2): 5 [IU] via SUBCUTANEOUS
  Filled 2022-10-13 (×2): qty 0.05

## 2022-10-13 MED ORDER — OXYCODONE HCL 5 MG PO TABS: 5.0000 mg | ORAL_TABLET | ORAL | Status: DC | PRN

## 2022-10-13 MED ORDER — INSULIN ASPART 100 UNIT/ML IJ SOLN
0.0000 [IU] | Freq: Three times a day (TID) | INTRAMUSCULAR | Status: DC
  Administered 2022-10-13: 3 [IU] via SUBCUTANEOUS
  Administered 2022-10-14: 5 [IU] via SUBCUTANEOUS
  Filled 2022-10-13: qty 0.15

## 2022-10-13 MED ORDER — ENOXAPARIN SODIUM 40 MG/0.4ML IJ SOSY
40.0000 mg | PREFILLED_SYRINGE | INTRAMUSCULAR | Status: DC
  Filled 2022-10-13: qty 0.4

## 2022-10-13 MED ORDER — HYDROMORPHONE HCL 1 MG/ML IJ SOLN
0.5000 mg | INTRAMUSCULAR | Status: DC | PRN
  Administered 2022-10-13: 1 mg via INTRAVENOUS
  Filled 2022-10-13: qty 1

## 2022-10-13 MED ORDER — PROPOFOL 10 MG/ML IV BOLUS
INTRAVENOUS | Status: DC | PRN
  Administered 2022-10-13: 100 mg via INTRAVENOUS

## 2022-10-13 MED ORDER — PROPOFOL 500 MG/50ML IV EMUL
INTRAVENOUS | Status: DC | PRN
Start: 2022-10-13 — End: 2022-10-13
  Administered 2022-10-13: 100 ug/kg/min via INTRAVENOUS

## 2022-10-13 MED ORDER — AMLODIPINE BESYLATE 10 MG PO TABS
10.0000 mg | ORAL_TABLET | Freq: Every day | ORAL | Status: DC
  Administered 2022-10-13 – 2022-10-14 (×2): 10 mg via ORAL
  Filled 2022-10-13: qty 2
  Filled 2022-10-13: qty 1

## 2022-10-13 MED ORDER — DICLOFENAC SUPPOSITORY 100 MG
RECTAL | Status: AC
  Filled 2022-10-13: qty 1

## 2022-10-13 MED ORDER — PIPERACILLIN-TAZOBACTAM 3.375 G IVPB
3.3750 g | Freq: Three times a day (TID) | INTRAVENOUS | Status: DC
  Administered 2022-10-13 – 2022-10-14 (×3): 3.375 g via INTRAVENOUS
  Filled 2022-10-13 (×3): qty 50

## 2022-10-13 MED ORDER — GLUCAGON HCL RDNA (DIAGNOSTIC) 1 MG IJ SOLR
INTRAMUSCULAR | Status: DC | PRN
Start: 2022-10-13 — End: 2022-10-13
  Administered 2022-10-13: .25 mg via INTRAVENOUS

## 2022-10-13 MED ORDER — ALBUTEROL SULFATE (2.5 MG/3ML) 0.083% IN NEBU: 2.5000 mg | INHALATION_SOLUTION | RESPIRATORY_TRACT | Status: DC | PRN

## 2022-10-13 MED ORDER — SODIUM CHLORIDE 0.9 % IV SOLN
INTRAVENOUS | Status: DC | PRN
Start: 2022-10-13 — End: 2022-10-13

## 2022-10-13 MED ORDER — ROCURONIUM BROMIDE 10 MG/ML (PF) SYRINGE
PREFILLED_SYRINGE | INTRAVENOUS | Status: DC | PRN
  Administered 2022-10-13: 50 mg via INTRAVENOUS

## 2022-10-13 MED ORDER — GLUCAGON HCL RDNA (DIAGNOSTIC) 1 MG IJ SOLR
INTRAMUSCULAR | Status: AC
  Filled 2022-10-13: qty 1

## 2022-10-13 MED ORDER — PANTOPRAZOLE SODIUM 40 MG PO TBEC
40.0000 mg | DELAYED_RELEASE_TABLET | Freq: Two times a day (BID) | ORAL | Status: DC
  Administered 2022-10-13 – 2022-10-14 (×2): 40 mg via ORAL
  Filled 2022-10-13 (×2): qty 1

## 2022-10-13 MED ORDER — IOHEXOL 350 MG/ML SOLN
100.0000 mL | Freq: Once | INTRAVENOUS | Status: AC | PRN
  Administered 2022-10-13: 100 mL via INTRAVENOUS

## 2022-10-13 MED ORDER — TRAZODONE HCL 50 MG PO TABS: 25.0000 mg | ORAL_TABLET | Freq: Every evening | ORAL | Status: DC | PRN

## 2022-10-13 MED ORDER — PIPERACILLIN-TAZOBACTAM 3.375 G IVPB 30 MIN
3.3750 g | Freq: Once | INTRAVENOUS | Status: AC
  Administered 2022-10-13: 3.375 g via INTRAVENOUS
  Filled 2022-10-13: qty 50

## 2022-10-13 MED ORDER — INSULIN ASPART 100 UNIT/ML IJ SOLN
0.0000 [IU] | Freq: Every day | INTRAMUSCULAR | Status: DC
  Administered 2022-10-13: 3 [IU] via SUBCUTANEOUS
  Filled 2022-10-13: qty 0.05

## 2022-10-13 MED ORDER — INSULIN ASPART 100 UNIT/ML IJ SOLN
0.0000 [IU] | Freq: Three times a day (TID) | INTRAMUSCULAR | Status: DC
  Filled 2022-10-13: qty 0.15

## 2022-10-13 MED ORDER — LACTATED RINGERS IV SOLN
INTRAVENOUS | Status: AC | PRN
Start: 2022-10-13 — End: 2022-10-13
  Administered 2022-10-13: 10 mL/h via INTRAVENOUS

## 2022-10-13 NOTE — Transfer of Care (Signed)
Immediate Anesthesia Transfer of Care Note  Patient: Alexander [name]  Procedure(s) Performed: ENDOSCOPIC RETROGRADE CHOLANGIOPANCREATOGRAPHY (ERCP) WITH PROPOFOL STENT REMOVAL BILIARY STENT PLACEMENT  Patient Location: PACU  Anesthesia Type:General  Level of Consciousness: awake, oriented, and drowsy  Airway & Oxygen Therapy: Patient Spontanous Breathing and Patient connected to nasal cannula oxygen  Post-op Assessment: Report given to RN, Post -op Vital signs reviewed and stable, and Patient moving all extremities  Post vital signs: Reviewed and stable  Last Vitals:  Vitals Value Taken Time  BP 121/72 10/13/22 1540  Temp    Pulse 72 10/13/22 1548  Resp 15 10/13/22 1548  SpO2 97 % 10/13/22 1548  Vitals shown include unfiled device data.  Last Pain:  Vitals:   10/13/22 1540  TempSrc:   PainSc: 0-No pain         Complications: No notable events documented.

## 2022-10-13 NOTE — H&P (View-Only) (Signed)
Consultation  Primary Care Physician:  Shirline Frees, NP Primary Gastroenterologist:  Dr. Russella Dar       Reason for Consultation: Epigastric pain with worsening bilirubin and LFTs, concern for stent occlusion.  DOA: 10/12/2022         Hospital Day: 2         HPI:   Alexander [name] is a 82 y.o. male with past medical history significant for DM2, polycythemia vera, HTN, CAD, GERD, gout,diverticulosis, multiple adenomatous polyps,pancreatic head adenocarcinoma, and chronic back pain. Presents to the ER with epigastric pain and elevated LFT's. GI consulted.  Work up notable for sodium 134.  Calcium 8.7.  Normal kidney function.  Alk phos 457.  Albumin 3.4.  AST 100.  ALT 100.  Previously normal on 10/7. total bilirubin 4.6.  Previously 1.6 on 10/7.  WBC 20.  RBCs 4.03.  Normal hemoglobin.  Platelets normal. EKG is sinus rhythm. CT scam 10/11 Progressive gallbladder and biliary distension and near complete resolution of pneumobilia suggesting occlusion of the internal biliary stent.   Patient was recently in hospital on 09/01/2022 for jaundice, bilirubin 12.5, and elevated LFTs elevated as well.  Had lost about 30 to 35 pounds.  CT scan showed a 3 cm pancreatic head mass favoring pancreatic adenocarcinoma with severe pancreatic duct dilatation prominent intra and extrahepatic biliary ductal dilatation extending down to the distal CBD.   MRCP showed Small, ill-defined, subtly diffusion restricting and hypo enhancing mass in the inferior pancreatic head and uncinate,difficult to precisely delineate although measuring approximately 2.5 x 2.0 x 1.9 cm. Abrupt truncation of the central pancreatic duct with severe pancreatic ductal dilatation and atrophy of the pancreas distally. ERCP on 8/31 Malignant appearing distal biliary stricture ( likely pancreatic cancer) with biliary obstruction. Status post ERCP with sphincterotomy and biliary stent placement. Upper endoscopic [place] 9/9 A mass was  identified in the pancreatic head. This was staged T2 N0 Mx by endosonographic criteria. One stent was visualized endosonographically in the common bile duct. 2cm HH. Moderate gastritis. Enlarged lymph nodes. (See full reports below) Cytology diagnosis Adenocarcinoma CA 19-9 0n 10/7 281 CEA 3.5  Patient lying in bed, in and daughter at bedside.  Patient reports went on his regular 3-1/2 mile walk yesterday with his granddaughter and started to experience epigastric pain that radiated to his back.  Patient reports he came home and had food which resulted in severe epigastric pain that became intolerable.  No nausea or vomiting.  No fever or chills.  Reports 1 bowel movement this morning.  No pruritus or jaundice. Reports he has been taking pancreatic enzymes with meals which has helped with consistency of stools, light colored stools. Urine yellow color. Since he received Fentanyl via EMS yesterday he has not had any more epigastric pain.   Previous GI workup:   10/11 CT ANGIOGRAPHY CHEST, ABDOMEN AND PELVIS  IMPRESSION: 1. No evidence of thoracoabdominal aortic aneurysm or dissection. 2. No pulmonary embolism. 3. Progressive gallbladder and biliary distension and near complete resolution of pneumobilia suggesting occlusion of the internal biliary stent. Correlation with liver enzymes and possible hepatobiliary scintigraphy would be helpful to confirm an obstructive process. 4. Poorly defined mass within the pancreatic head better demonstrated on prior MRI examination of 09/01/2022 with associated marked dilation of the main pancreatic duct and marked atrophy of the pancreas, similar to prior examination. 5. Stable right lower lobe pulmonary nodules, indeterminate. Close attention on subsequent surveillance/restaging examination is warranted. If none are planned, a  follow-up evaluation in 6 months is recommended for further evaluation. 6. Mild-to-moderate distal colonic diverticulosis  without superimposed acute inflammatory change.   Aortic Atherosclerosis (ICD10-I70.0).  10/04/2022 CT CHEST WITH CONTRAST/ CT ABDOMEN AND PELVIS WITH CONTRAST  IMPRESSION: 1. The small lesion within the head of pancreas is suboptimally visualized on the current exam. As noted on the previous MRI this is difficult to precisely delineate and therefore accurately compare with previous imaging for interval growth. Best estimate is that this measures approximately 3.6 x 2.5 x 2.5 cm. Compared with the previous exam this appears similar in size (when remeasured). As mentioned previously the tumor appears to contact (but does not encase) the superior mesenteric vein near the portal venous confluence. There is also a small finger-like a prior projection (from the mass) of soft tissue extending up to and potentially contacting the superior mesenteric artery without encasement. 2. No signs of metastatic disease within the abdomen or pelvis. 3. There is a 1.2 cm non-solid nodule within the posterolateral right lower lobe. 4 mm internal solid component is suspected. This is indeterminate. Follow-up non-contrast CT recommended at 3-6 months to confirm persistence. 4. Common bile duct stent is in place. Pneumobilia is identified compatible with biliary patency. There is persistent mild intrahepatic bile duct dilatation which is improved compared with 09/01/2022. 5. Coronary artery calcifications. 6. Within the anterior right lower lobe there is a 5 mm solid nodule which abuts the major fissure. This appears unchanged from 08/31/2022. Nonspecific. Attention on follow-up imaging advised. 7.  Aortic Atherosclerosis (ICD10-I70.0).  8/29 CT scan abdomen and pelvis    IMPRESSION: 1. 3 cm pancreatic head mass strongly favoring pancreatic adenocarcinoma. There is severe dorsal pancreatic duct dilatation and prominent intrahepatic and extrahepatic biliary dilatation extending down to the distal  common bile duct in the vicinity of the pancreatic head mass. Other characteristics detailed above. 2. 3.2 by 1.8 cm homogeneous hyperdense lesion of the left kidney posteriorly, internal density 50 Hounsfield units, likely a complex cyst given the homogeneity, but technically nonspecific. This and the pancreatic lesion could be further characterized with dedicated pancreatic protocol MRI with and without contrast. 3. Sigmoid colon diverticulosis. 4. Lower rectal and anal wall thickening circumferentially, likely from nondistention but technically nonspecific, correlate with colon cancer screening history. 5. Bilateral mild foraminal impingement at L5-S1 due to intervertebral and facet spurring. 6. Aortic atherosclerosis.   Aortic Atherosclerosis (ICD10-I70.0).   09/01/22 MRI abdomen/MRCP as follows:   IMPRESSION: 1. Small, ill-defined, subtly diffusion restricting and hypoenhancing mass in the inferior pancreatic head and uncinate, difficult to precisely delineate although measuring approximately 2.5 x 2.0 x 1.9 cm. Abrupt truncation of the central pancreatic duct with severe pancreatic ductal dilatation and atrophy of the pancreas distally. 2. Severe intra and extrahepatic biliary ductal dilatation, the common bile duct abruptly truncated within the central pancreatic head. 3. Findings consistent with pancreatic adenocarcinoma. 4. For purposes of local staging, mass most likely makes minimal contact to the posterior superior mesenteric vein near the confluence of the portal vein but appears to have preserved fat planes to the adjacent superior mesenteric artery and the portal vein proper. 5. No evidence of lymphadenopathy or organ metastatic disease in the abdomen. 6. Large burden of stool in the colon.   Aortic Atherosclerosis (ICD10-I70.0).   09/11/22 Upper Endoscopic US  Impression: EGD Impression: - No gross lesions in the entire esophagus. Z- line regular, 37 cm from  the incisors. - 2 cm hiatal hernia. - Moderate gastritis and erosions noted in  the body/ antrum. No other gross lesions in the entire stomach. Biopsied. - No gross lesions in the duodenal bulb, in the first portion of the duodenum and in the second portion of the duodenum. - Plastic biliary stent at the major papilla.  EUS Impression: - A mass was identified in the pancreatic head ( where CBD/ PD dilation is noted) . Cytology results are pending. However, the endosonographic appearance is suspicious for adenocarcinoma. Fine needle biopsy performed of the mass. This was staged T2 N0 Mx by endosonographic criteria. The staging applies if malignancy is confirmed. However see notation of the LN below. - One stent was visualized endosonographically in the common bile duct. - Hyperechoic material consistent with sludge was visualized endosonographically in the gallbladder. - Two enlarged lymph nodes were visualized in the porta hepatis region and although they are hypoechoic and could be concerning for disease, they do not meet EUS size criteria for metastatic components ( need to be 1 cm in size) .  09/11/2022 FINAL MICROSCOPIC DIAGNOSIS:   A. STOMACH, BIOPSY:       Gastric antral mucosa with focal mild chronic inactive gastritis  and proton pump inhibitor effect.       No H. pylori identified on HE.       Negative for intestinal metaplasia or dysplasia.   Clinical History: Pancreatic lesion; hiatal hernia 2 cm, gastritis,  gastric biopsy r/o h pylori  Specimen Submitted:  A. PANCREAS, HEAD MASS, FINE NEEDLE ASPIRATION:   FINAL MICROSCOPIC DIAGNOSIS:  - Adenocarcinoma   09/02/22 ERCP Impression: 1. Malignant appearing distal biliary stricture ( likely pancreatic cancer) with biliary obstruction. 2. Status post ERCP with sphincterotomy and biliary stent placement.  08/01/13 Flex sigmoidoscopy ENDOSCOPIC IMPRESSION: 1. Moderate diverticulosis in the descending colon and sigmoid colon 2. Large internal  hemorrhoids, moderate external hemorrhoids.   Past Medical History:  Diagnosis Date   Arthritis    "minor; shoulders primarily" (05/20/2013)   CAD (coronary artery disease)    a. 05/2013 - Chest pain/unstable angina and dynamic EKG changes showing cath showing atherosclerotic coronary artery disease manifested as diffuse ectasia, normal EF.   Diverticulosis of colon    GERD (gastroesophageal reflux disease)    GI bleed    secondary to Aspirin   Gout    Hematuria, microscopic    Hemorrhoids    Hyperglycemia    Hyperlipidemia    Hypertension    Lumbar back pain    Microalbuminuria    Overweight(278.02)    Pancreatic cancer (HCC) 09/22/2022   Polycythemia rubra vera (HCC)    Tubular adenoma of colon 07/2012    Surgical History:  He  has a past surgical history that includes Cataract extraction w/ intraocular lens  implant, bilateral (Bilateral, 2008); Excisional hemorrhoidectomy (1970's); Cardiac catheterization (05/20/2013); Inguinal hernia repair (Right, ~ 2010); Tonsillectomy and adenoidectomy (1940's); Cystectomy (~ 2000); left heart catheterization with coronary angiogram (N/A, 05/20/2013); Vasectomy; Endoscopic retrograde cholangiopancreatography (ercp) with propofol (N/A, 09/02/2022); sphincterotomy (09/02/2022); biliary stent placement (N/A, 09/02/2022); EUS (N/A, 09/11/2022); Esophagogastroduodenoscopy (N/A, 09/11/2022); biopsy (09/11/2022); and Fine needle aspiration (N/A, 09/11/2022). Family History:  His family history includes Cancer in his maternal uncle; Colon cancer in his mother; Diabetes in his father and mother; Heart disease (age of onset: 13) in his father; Stomach cancer in his maternal grandfather. Social History:   reports that he has never smoked. He has never used smokeless tobacco. He reports that he does not drink alcohol and does not use drugs.  Prior to Admission medications  Medication Sig Start Date End Date Taking? Authorizing Provider  amLODipine (NORVASC) 10 MG  tablet TAKE ONE TABLET BY MOUTH DAILY 03/21/22  Yes Nafziger, Kandee Keen, NP  glipiZIDE (GLUCOTROL XL) 10 MG 24 hr tablet Take 10 mg by mouth in the morning and at bedtime.   Yes [provider]  lipase/protease/amylase (CREON) 36000 UNITS CPEP capsule Take 2 capsules (72,000 Units total) by mouth 3 (three) times daily with meals. May also take 1 capsule (36,000 Units total) as needed (with snacks - up to 4 snacks daily). 10/05/22  Yes Josph Macho, MD  pantoprazole (PROTONIX) 40 MG tablet Take 1 tablet (40 mg total) by mouth 2 (two) times daily before a meal. 09/11/22  Yes Mansouraty, Netty Starring., MD  Accu-Chek Softclix Lancets lancets Use to test blood glucose once daily 11/09/21   Nafziger, Kandee Keen, NP  Blood Glucose Calibration (EMBRACE PRO GLUCOSE CONTROL) LIQD Use to check blood glucose TID Patient not taking: Reported on 10/09/2022 04/19/22   Shirline Frees, NP  Blood Glucose Monitoring Suppl (ACCU-CHEK AVIVA PLUS) w/Device KIT Use to test blood glucose once daily Patient not taking: Reported on 10/09/2022 12/11/17   Shirline Frees, NP  Continuous Glucose Sensor (FREESTYLE LIBRE 3 SENSOR) MISC 1 Device by Does not apply route every 14 (fourteen) days. Place 1 sensor on the skin every 14 days. Use to check glucose continuously 09/14/22   Nafziger, Kandee Keen, NP  glucose blood (EMBRACE PRO GLUCOSE TEST) test strip Use as instructed 04/19/22   Nafziger, Kandee Keen, NP  insulin glargine (LANTUS) 100 UNIT/ML injection Inject 0.1 mLs (10 Units total) into the skin daily. 10/10/22   Nafziger, Kandee Keen, NP  Insulin Pen Needle 29G X MISC Use daily for insulin pen 10/10/22   Nafziger, Kandee Keen, NP    Current Facility-Administered Medications  Medication Dose Route Frequency Provider Last Rate Last Admin   albuterol (PROVENTIL) (2.5 MG/3ML) 0.083% nebulizer solution 2.5 mg  2.5 mg Nebulization Q2H PRN Kirby Crigler, Mir M, MD       amLODipine (NORVASC) tablet 10 mg  10 mg Oral Daily Kirby Crigler, Mir M, MD       enoxaparin  (LOVENOX) injection 40 mg  40 mg Subcutaneous Q24H Kirby Crigler, Mir M, MD       HYDROmorphone (DILAUDID) injection 0.5-1 mg  0.5-1 mg Intravenous Q2H PRN Kirby Crigler, Mir M, MD       ibuprofen (ADVIL) tablet 400 mg  400 mg Oral Q6H PRN Kirby Crigler, Mir M, MD       insulin aspart (novoLOG) injection 0-15 Units  0-15 Units Subcutaneous TID WC Kirby Crigler, Mir M, MD       insulin aspart (novoLOG) injection 0-5 Units  0-5 Units Subcutaneous QHS Kirby Crigler, Mir M, MD       insulin glargine-yfgn [facility]) injection 5 Units  5 Units Subcutaneous Daily Kirby Crigler, Mir M, MD       ondansetron [facility]) tablet 4 mg  4 mg Oral Q6H PRN Kirby Crigler, Mir M, MD       Or   ondansetron [facility]) injection 4 mg  4 mg Intravenous Q6H PRN Kirby Crigler, Mir M, MD       oxyCODONE (Oxy IR/ROXICODONE) immediate release tablet 5 mg  5 mg Oral Q4H PRN Kirby Crigler, Mir M, MD       pantoprazole (PROTONIX) EC tablet 40 mg  40 mg Oral BID AC Kirby Crigler, Mir M, MD   40 mg at 10/13/22 0831   piperacillin-tazobactam (ZOSYN) IVPB 3.375 g  3.375 g Intravenous Q8H [facility] [name]  traZODone (DESYREL) tablet 25 mg  25 mg Oral QHS PRN Kirby Crigler, Mir M, MD       Current Outpatient Medications  Medication Sig Dispense Refill   amLODipine (NORVASC) 10 MG tablet TAKE ONE TABLET BY MOUTH DAILY 90 tablet 3   glipiZIDE (GLUCOTROL XL) 10 MG 24 hr tablet Take 10 mg by mouth in the morning and at bedtime.     lipase/protease/amylase (CREON) 36000 UNITS CPEP capsule Take 2 capsules (72,000 Units total) by mouth 3 (three) times daily with meals. May also take 1 capsule (36,000 Units total) as needed (with snacks - up to 4 snacks daily). 180 capsule 0   pantoprazole (PROTONIX) 40 MG tablet Take 1 tablet (40 mg total) by mouth 2 (two) times daily before a meal. 60 tablet 6   Accu-Chek Softclix Lancets lancets Use to test blood glucose once daily 100 each 3   Blood Glucose Calibration (EMBRACE PRO GLUCOSE CONTROL) LIQD Use to check blood glucose  TID (Patient not taking: Reported on 10/09/2022) 1 each    Blood Glucose Monitoring Suppl (ACCU-CHEK AVIVA PLUS) w/Device KIT Use to test blood glucose once daily (Patient not taking: Reported on 10/09/2022) 1 kit 0   Continuous Glucose Sensor (FREESTYLE LIBRE 3 SENSOR) MISC 1 Device by Does not apply route every 14 (fourteen) days. Place 1 sensor on the skin every 14 days. Use to check glucose continuously 2 each 6   glucose blood (EMBRACE PRO GLUCOSE TEST) test strip Use as instructed 100 each 12   insulin glargine (LANTUS) 100 UNIT/ML injection Inject 0.1 mLs (10 Units total) into the skin daily. 10 mL 1   Insulin Pen Needle 29G X MISC Use daily for insulin pen 90 each 1    Allergies as of 10/12/2022 - Review Complete 10/12/2022  Allergen Reaction Noted   Metformin and related Diarrhea 06/06/2012   Metformin hcl Diarrhea 10/07/2020    Review of Systems:    Constitutional: No weight loss, fever, chills, weakness or fatigue HEENT: Eyes: No change in vision               Ears, Nose, Throat:  No change in hearing or congestion Skin: No rash or itching Cardiovascular: No chest pain, chest pressure or palpitations   Respiratory: No SOB or cough Gastrointestinal: See HPI and otherwise negative Genitourinary: No dysuria or change in urinary frequency Neurological: No headache, dizziness or syncope Musculoskeletal: No new muscle or joint pain Hematologic: No bleeding or bruising Psychiatric: No history of depression or anxiety     Physical Exam:  Vital signs in last 24 hours: Temp:  [98.9 F (37.2 C)-99.2 F (37.3 C)] 99 F (37.2 C) (10/11 0742) Pulse Rate:  [65-83] 66 (10/11 0742) Resp:  [11-21] 21 (10/11 0742) BP: (110-155)/(70-86) 121/73 (10/11 0742) SpO2:  [97 %-100 %] 100 % (10/11 0742)   Last BM recorded by nurses in past 5 days No data recorded  General:   Pleasant, well developed male in no acute distress Head:  Normocephalic and atraumatic. Eyes: sclerae  anicteric Heart:  regular rate and rhythm Pulm: Clear anteriorly; no wheezing Abdomen:  Soft, Non-distended AB, Active bowel sounds. No tenderness    .Without guarding and Without rebound, No organomegaly appreciated. Extremities:  Without edema. Msk:  Symmetrical without gross deformities. Peripheral pulses intact.  Neurologic:  Alert and  oriented x4;  No focal deficits.  Skin:   Dry and intact without significant lesions or rashes. Psychiatric:  Cooperative. Normal mood and affect.  LAB  RESULTS: Recent Labs    10/12/22 2344  WBC 20.0*  HGB 13.4  HCT 39.3  PLT 243   BMET Recent Labs    10/12/22 2344  NA 134*  K 3.5  CL 102  CO2 24  GLUCOSE 234*  BUN 23  CREATININE 1.02  CALCIUM 8.7*   LFT Recent Labs    10/12/22 2344  PROT 6.6  ALBUMIN 3.4*  AST 100*  ALT 100*  ALKPHOS 457*  BILITOT 4.6*   PT/INR No results for input(s): "LABPROT", "INR" in the last 72 hours.  STUDIES: CT Angio Chest/Abd/Pel for Dissection W and/or Wo Contrast  Result Date: 10/13/2022 CLINICAL DATA:  Acute aortic syndrome, upper abdominal pain, upper back pain. Pancreatic cancer, leukocytosis. EXAM: CT ANGIOGRAPHY CHEST, ABDOMEN AND PELVIS TECHNIQUE: Non-contrast CT of the chest was initially obtained. Multidetector CT imaging through the chest, abdomen and pelvis was performed using the standard protocol during bolus administration of intravenous contrast. Multiplanar reconstructed images and MIPs were obtained and reviewed to evaluate the vascular anatomy. RADIATION DOSE REDUCTION: This exam was performed according to the departmental dose-optimization program which includes automated exposure control, adjustment of the mA and/or kV according to patient size and/or use of iterative reconstruction technique. CONTRAST:  OMNIPAQUE IOHEXOL 350 MG/ML SOLN COMPARISON:  None Available. FINDINGS: CTA CHEST FINDINGS Cardiovascular: The thoracic aorta is normal in course and caliber. No intramural  hematoma, dissection, or aneurysm. Mild atherosclerotic calcification. Arch vasculature demonstrates classic anatomic configuration and is widely patent proximally. Mild aortic valvular calcification. No significant coronary artery calcification. Global cardiac size is within normal limits. No pericardial effusion. There is adequate opacification of the pulmonary arterial tree no intraluminal filling defect is identified through the segmental level to suggest acute pulmonary embolism. Central pulmonary arteries are of normal caliber. Mediastinum/Nodes: No enlarged mediastinal, hilar, or axillary lymph nodes. Thyroid gland, trachea, and esophagus demonstrate no significant findings. Lungs/Pleura: 10 mm sub solid pulmonary nodule is seen within the right lower lobe, axial image # 72/6, stable since prior examination. 5 mm noncalcified subpleural pulmonary nodule within the right lower lobe, axial image # 94/6, stable since prior examination. Lungs are otherwise clear. No confluent pulmonary infiltrate. No pneumothorax or pleural effusion. Central airways are widely patent. Musculoskeletal: Osseous structures are age-appropriate. No acute bone abnormality. Review of the MIP images confirms the above findings. CTA ABDOMEN AND PELVIS FINDINGS VASCULAR Aorta: Normal caliber aorta without aneurysm, dissection, vasculitis or significant stenosis. Moderate atherosclerotic calcification Celiac: Patent without evidence of aneurysm, dissection, vasculitis or significant stenosis. SMA: Patent without evidence of aneurysm, dissection, vasculitis or significant stenosis. Renals: Both renal arteries are patent without evidence of aneurysm, dissection, vasculitis, fibromuscular dysplasia or significant stenosis. IMA: Patent without evidence of aneurysm, dissection, vasculitis or significant stenosis. Inflow: Patent without evidence of aneurysm, dissection, vasculitis or significant stenosis. Veins: No obvious venous abnormality  within the limitations of this arterial phase study. Review of the MIP images confirms the above findings. NON-VASCULAR Hepatobiliary: Internal biliary stent is again seen extending from the mid common duct to the third portion of the duodenum. However, since the prior examination, there has developed progressive gallbladder and biliary distension and near complete resolution of pneumobilia suggesting occlusion of the stent. No enhancing intrahepatic mass. The gallbladder is otherwise unremarkable. Pancreas: The main pancreatic duct is markedly dilated throughout its course and there is marked atrophy of the pancreas, similar to prior MRI examination of 09/01/2022 with a poorly defined mass within the pancreatic head better demonstrated on prior  examination. Spleen: Unremarkable Adrenals/Urinary Tract: The adrenal glands are unremarkable. The kidneys are normal in size and position. Hemorrhagic cyst noted within the posterior interpolar region of the left kidney better demonstrated on prior MRI examination 09/01/2022. The kidneys are otherwise unremarkable. Bladder unremarkable. Stomach/Bowel: Mild-to-moderate distal colonic diverticulosis, most severe within the sigmoid colon. The stomach, small bowel, and large bowel are otherwise unremarkable. No evidence of obstruction or focal inflammation. Appendix normal. No free intraperitoneal gas or fluid. Lymphatic: No pathologic adenopathy within the abdomen and pelvis. Shotty peripancreatic adenopathy with adjacent to the head of the pancreas within the aortocaval region is nonspecific,. Reproductive: Prostate is unremarkable. Other: Small fat containing right inguinal hernia Musculoskeletal: Degenerative changes seen within the lumbar spine. No acute bone abnormality. No lytic or blastic bone lesion. Review of the MIP images confirms the above findings. IMPRESSION: 1. No evidence of thoracoabdominal aortic aneurysm or dissection. 2. No pulmonary embolism. 3.  Progressive gallbladder and biliary distension and near complete resolution of pneumobilia suggesting occlusion of the internal biliary stent. Correlation with liver enzymes and possible hepatobiliary scintigraphy would be helpful to confirm an obstructive process. 4. Poorly defined mass within the pancreatic head better demonstrated on prior MRI examination of 09/01/2022 with associated marked dilation of the main pancreatic duct and marked atrophy of the pancreas, similar to prior examination. 5. Stable right lower lobe pulmonary nodules, indeterminate. Close attention on subsequent surveillance/restaging examination is warranted. If none are planned, a follow-up evaluation in 6 months is recommended for further evaluation. 6. Mild-to-moderate distal colonic diverticulosis without superimposed acute inflammatory change. Aortic Atherosclerosis (ICD10-I70.0). Electronically Signed   By: Helyn Numbers M.D.   On: 10/13/2022 02:08      Impression /Plan:   82 year old male patient with medical history significant for pancreatic head adenocarcinoma being admitted to the hospital with epigastric pain and concern for biliary stent occlusion.  Epigastric pain from biliary occlusion secondary to pancreatic head adenocarcinoma. Leukocytosis, afebrile, dynamically stable.  WBC 20, up from 8.2 on 10/7 -Trend CBC, CMP, LFT's -NPO - Scheduled ERCP with stent exchange at [facility] today. The benefits and risks of ERCP with possible sphincterotomy not limited to cardiopulmonary complications of sedation, bleeding, infection, perforation,and pancreatitis were discussed with the patient who agrees to proceed.   -Hold am lovenox -Continue Pantoprazole 40mg   -Pain and nausea medication prn -On IV Zosyn -Check INR/ PT now  Elevated LFTs secondary to obstructed biliary stent.  AST/ALT 100, total bili 4.6.  Lipase 18. -Trend LFTs  Pancreatic adenocarcinoma -following with Dr. Myna Hidalgo -scheduled with second opinion  with [facility] next week  Type 2 DM -SSI  Principal Problem:   Pancreatic cancer (HCC) Active Problems:   Obstructive jaundice    LOS: 0 days    Thank you for your kind consultation, we will continue to follow.   Deanna J May  10/13/2022, 9:01 AM    Attending Physician Note   I have taken a history, reviewed the chart and examined the patient. I performed a substantive portion of this encounter, including complete performance of at least one of the key components, in conjunction with the APP. I agree with the APP's note, impression and recommendations with my edits. My additional impressions and recommendations are as follows.   Pancreatic head adenocarcinoma, T2 N0 M0, with a biliary stent placed in August presents with signs and symptoms of biliary obstruction suspected biliary stent obstruction.  ERCP with stent exchange planned.  IV Zosyn for now.  Ongoing follow-up with oncology as  planned.  Claudette Head, MD [facility], [facility], for our on call provider

## 2022-10-13 NOTE — Progress Notes (Signed)
Pharmacy Antibiotic Note  Alexander [name] is a 82 y.o. male admitted on 10/12/2022 with  IAI .  Pharmacy has been consulted for Zosyn dosing.  Plan: Zosyn 3.375g IV q8h (4 hour infusion). Will sign off     Temp (24hrs), Avg:99.1 F (37.3 C), Min:98.9 F (37.2 C), Max:99.2 F (37.3 C)  Recent Labs  Lab 10/09/22 1443 10/12/22 2344  WBC 8.2 20.0*  CREATININE 1.08 1.02    Estimated Creatinine Clearance: 48.7 mL/min (by C-G formula based on SCr of 1.02 mg/dL).    Allergies  Allergen Reactions   Metformin And Related Diarrhea   Metformin Hcl Diarrhea      Thank you for allowing pharmacy to be a part of this patient's care.  Berkley Harvey 10/13/2022 8:34 AM

## 2022-10-13 NOTE — Anesthesia Preprocedure Evaluation (Addendum)
Anesthesia Evaluation  Patient identified by MRN, date of birth, ID band Patient awake    Reviewed: Allergy & Precautions, NPO status , Patient's Chart, lab work & pertinent test results  History of Anesthesia Complications Negative for: history of anesthetic complications  Airway Mallampati: I  TM Distance: >3 FB Neck ROM: Full    Dental no notable dental hx.    Pulmonary neg pulmonary ROS   Pulmonary exam normal        Cardiovascular hypertension, Pt. on medications + CAD  Normal cardiovascular exam     Neuro/Psych negative neurological ROS     GI/Hepatic ,GERD  Medicated,,elevated LFT's, adenocarcinoma, stent replacement   Endo/Other  diabetes, Type 2, Insulin Dependent, Oral Hypoglycemic Agents    Renal/GU negative Renal ROS     Musculoskeletal  (+) Arthritis ,    Abdominal   Peds  Hematology negative hematology ROS (+)   Anesthesia Other Findings Day of surgery medications reviewed with patient.  Reproductive/Obstetrics                              Anesthesia Physical Anesthesia Plan  ASA: 3  Anesthesia Plan: General   Post-op Pain Management: Minimal or no pain anticipated   Induction: Intravenous  PONV Risk Score and Plan: 2 and Treatment may vary due to age or medical condition, Ondansetron and Dexamethasone  Airway Management Planned: Oral ETT  Additional Equipment: None  Intra-op Plan:   Post-operative Plan: Extubation in OR  Informed Consent: I have reviewed the patients History and Physical, chart, labs and discussed the procedure including the risks, benefits and alternatives for the proposed anesthesia with the patient or authorized representative who has indicated his/her understanding and acceptance.     Dental advisory given  Plan Discussed with: CRNA  Anesthesia Plan Comments:         Anesthesia Quick Evaluation

## 2022-10-13 NOTE — Interval H&P Note (Signed)
History and Physical Interval Note:  10/13/2022 2:34 PM  Alexander [name]  has presented today for surgery, with the diagnosis of elevated LFT's, adenocarcinoma, stent replacement.  The various methods of treatment have been discussed with the patient and family. After consideration of risks, benefits and other options for treatment, the patient has consented to  Procedure(s): ENDOSCOPIC RETROGRADE CHOLANGIOPANCREATOGRAPHY (ERCP) WITH PROPOFOL (N/A) as a surgical intervention.  The patient's history has been reviewed, patient examined, no change in status, stable for surgery.  I have reviewed the patient's chart and labs.  Questions were answered to the patient's satisfaction.     Venita Lick. Russella Dar

## 2022-10-13 NOTE — Progress Notes (Signed)
[facility] at bedside to transport pt to [facility].  Eulas Post, RN 10/13/22 4:41 PM

## 2022-10-13 NOTE — Consult Note (Signed)
Alexander [name] is well-known to me.  He is a very nice 82 year old white male.  He is incredibly fit.  He has localized adenocarcinoma of the pancreatic head.  He presented with biliary obstruction about a month or so ago.  He has had a complete workup.  He had a stent placed by Gastroenterology.  He has recent been seen by Surgical oncology at [facility].  They felt that he would benefit from neoadjuvant chemotherapy.  He is set up to see the medical oncologist at [facility] I think next week.  He is said he is walked 3 and half miles yesterday.  He was doing well.  After he got back, and had dinner, he began to have abdominal pain.  He had no nausea or vomiting.  He had no diarrhea.  There is no fever.  He subsequently came to the emergency room.  His bilirubin is up to 4.6.  When we saw him recently on 10/09/2022, bilirubin was down to 1.6.  His SGPT is 100 SGOT 100.  Alkaline phosphatase was 457.  His blood sugar was 234.  He is having his diabetes managed by his family doctor.  His white cell count however was quite high at 20,000.  Hemoglobin 13.4 platelet count 243,000.  He did not have any chills.  There is no rigors.  He did not noted any diaphoresis.  He had a CT angiogram.  There is no pulmonary embolism.  He had progressive gallbladder and biliary distention.  It appeared that there was probably occlusion of the biliary stent.  He had the  Head mass which appeared to be stable.  Everything looked relatively stable from perspective of his malignancy.   His vital signs are temperature 99.  Pulse 66.  Blood pressure 121/73.  His head and exam shows some scleral icterus.  He has some palatal icterus.  There is no adenopathy in the neck.  Lungs are clear bilaterally.  Cardiac exam regular rate and rhythm.  Abdomen is soft.  He has no guarding or rebound tenderness.  He has no fluid wave.  There is no obvious abdominal mass.  There is no palpable liver or spleen tip.  Extremity shows no  clubbing, cyanosis or edema.  Neurological exam is nonfocal.   Alexander [name] is a very nice 82 year old white male.  He has a localized pancreatic cancer.  Again, the Surgical Oncologist at [facility] wants him to have neoadjuvant therapy.  Hopefully, he is to see the Medical Oncologist at [facility] sometime next week.  Clearly, I think the problem is a stent.  His biliary numbers are elevated.  He has a high white cell count.  He does not look like he has biliary infection.  He certainly looks quite good.  I actually, very impressed with how well he looks.  Unfortunately, I think is going had to be admitted and probably have the stent exchange.  I do not know of a metal stent can be put in the biliary tree.  Thankfully, we do not see anything that looks like malignancy that is progressing.  We will follow along.  I know that he will get incredible care no matter where he is located.   Christin Bach, MD  Hughie Closs 4:32

## 2022-10-13 NOTE — ED Provider Notes (Addendum)
[place] EMERGENCY DEPARTMENT AT [facility] Provider Note   CSN: [id number] Arrival date & time: 10/12/22  2240     History  Chief Complaint  Patient presents with   Abdominal Pain    Alexander [name] is a 82 y.o. male.  Patient with a history of pancreatic cancer not yet on treatment, diabetes, CAD, hypertension, hyperlipidemia, polycythemia vera presents with epigastric pain that radiates to his bilateral ribs and upper back.  Symptoms started about 1215 after getting back from a walk outside.  Pain was there all afternoon became acutely worse after eating dinner about 7 PM.  He is now pain-free after receiving fentanyl from EMS.  States the pain was in his epigastrium radiated to his bilateral shoulders and upper back.  No nausea, vomiting, cough, fever, runny nose or sore throat.  Denies chest pain or shortness of breath.  He is pain-free now.  States he was hurting all afternoon with his epigastric pain going to his mid back and bilateral ribs.  Does not feel short of breath.  No cough or fever.  No leg pain or leg swelling.  No pain with urination or blood in the urine.  He underwent ERCP on 8/29 and plastic stent was placed. Subsequently had EGD/EUS on 9/9 which identified the pancreatic head mass T2N0Mx. Biopsy confirmed invasive adenocarcinoma. He was seen by Dr. Donell Beers at [facility] who recommended upfront surgery vs neoadjuvant chemotherapy   The history is provided by the patient.  Abdominal Pain Associated symptoms: no chest pain, no dysuria, no fever, no hematuria, no nausea and no vomiting        Home Medications Prior to Admission medications   Medication Sig Start Date End Date Taking? Authorizing Provider  Accu-Chek Softclix Lancets lancets Use to test blood glucose once daily 11/09/21   Nafziger, Kandee Keen, NP  amLODipine (NORVASC) 10 MG tablet TAKE ONE TABLET BY MOUTH DAILY 03/21/22   Nafziger, Kandee Keen, NP  Blood Glucose Calibration (EMBRACE  PRO GLUCOSE CONTROL) LIQD Use to check blood glucose TID Patient not taking: Reported on 10/09/2022 04/19/22   Shirline Frees, NP  Blood Glucose Monitoring Suppl (ACCU-CHEK AVIVA PLUS) w/Device KIT Use to test blood glucose once daily Patient not taking: Reported on 10/09/2022 12/11/17   Shirline Frees, NP  Continuous Glucose Sensor (FREESTYLE LIBRE 3 SENSOR) MISC 1 Device by Does not apply route every 14 (fourteen) days. Place 1 sensor on the skin every 14 days. Use to check glucose continuously 09/14/22   Nafziger, Kandee Keen, NP  glucose blood (EMBRACE PRO GLUCOSE TEST) test strip Use as instructed 04/19/22   Nafziger, Kandee Keen, NP  insulin glargine (LANTUS) 100 UNIT/ML injection Inject 0.1 mLs (10 Units total) into the skin daily. 10/10/22   Nafziger, Kandee Keen, NP  Insulin Pen Needle 29G X MISC Use daily for insulin pen 10/10/22   Nafziger, Kandee Keen, NP  lipase/protease/amylase (CREON) 36000 UNITS CPEP capsule Take 2 capsules (72,000 Units total) by mouth 3 (three) times daily with meals. May also take 1 capsule (36,000 Units total) as needed (with snacks - up to 4 snacks daily). 10/05/22   Josph Macho, MD  lisinopril (ZESTRIL) 10 MG tablet Take 10 mg by mouth daily.    [provider]  pantoprazole (PROTONIX) 40 MG tablet Take 1 tablet (40 mg total) by mouth 2 (two) times daily before a meal. 09/11/22   Mansouraty, Netty Starring., MD      Allergies    Metformin and related and Metformin hcl  Review of Systems   Review of Systems  Constitutional:  Negative for activity change, appetite change and fever.  HENT:  Negative for congestion and rhinorrhea.   Respiratory:  Negative for chest tightness.   Cardiovascular:  Negative for chest pain.  Gastrointestinal:  Positive for abdominal pain. Negative for nausea and vomiting.  Genitourinary:  Negative for dysuria and hematuria.  Musculoskeletal:  Positive for back pain. Negative for arthralgias and myalgias.  Skin:  Negative for rash.  Neurological:   Negative for dizziness, weakness and headaches.   all other systems are negative except as noted in the HPI and PMH.    Physical Exam Updated Vital Signs BP (!) 155/86 (BP Location: Left Arm)   Pulse 76   Temp 99.2 F (37.3 C) (Oral)   Resp 18   SpO2 97%  Physical Exam Vitals and nursing note reviewed.  Constitutional:      General: He is not in acute distress.    Appearance: He is well-developed.  HENT:     Head: Normocephalic and atraumatic.     Mouth/Throat:     Pharynx: No oropharyngeal exudate.  Eyes:     Conjunctiva/sclera: Conjunctivae normal.     Pupils: Pupils are equal, round, and reactive to light.  Neck:     Comments: No meningismus. Cardiovascular:     Rate and Rhythm: Normal rate and regular rhythm.     Heart sounds: Normal heart sounds. No murmur heard. Pulmonary:     Effort: Pulmonary effort is normal. No respiratory distress.     Breath sounds: Normal breath sounds.  Abdominal:     Palpations: Abdomen is soft.     Tenderness: There is abdominal tenderness. There is no guarding or rebound.     Comments: Epigastric tenderness  Musculoskeletal:        General: No tenderness. Normal range of motion.     Cervical back: Normal range of motion and neck supple.  Skin:    General: Skin is warm.  Neurological:     Mental Status: He is alert and oriented to person, place, and time.     Cranial Nerves: No cranial nerve deficit.     Motor: No abnormal muscle tone.     Coordination: Coordination normal.     Comments:  5/5 strength throughout. CN 2-12 intact.Equal grip strength.   Psychiatric:        Behavior: Behavior normal.     ED Results / Procedures / Treatments   Labs (all labs ordered are listed, but only abnormal results are displayed) Labs Reviewed  CBC WITH DIFFERENTIAL/PLATELET - Abnormal; Notable for the following components:      Result Value   WBC 20.0 (*)    RBC 4.03 (*)    Neutro Abs 18.4 (*)    Lymphs Abs 0.5 (*)    Abs Immature  Granulocytes 0.12 (*)    All other components within normal limits  COMPREHENSIVE METABOLIC PANEL - Abnormal; Notable for the following components:   Sodium 134 (*)    Glucose, Bld 234 (*)    Calcium 8.7 (*)    Albumin 3.4 (*)    AST 100 (*)    ALT 100 (*)    Alkaline Phosphatase 457 (*)    Total Bilirubin 4.6 (*)    All other components within normal limits  URINALYSIS, ROUTINE W REFLEX MICROSCOPIC - Abnormal; Notable for the following components:   Specific Gravity, Urine 1.036 (*)    Glucose, UA >=500 (*)    All  other components within normal limits  LIPASE, BLOOD  TROPONIN I (HIGH SENSITIVITY)  TROPONIN I (HIGH SENSITIVITY)    EKG EKG Interpretation Date/Time:  Friday October 13 2022 00:59:31 EDT Ventricular Rate:  79 PR Interval:  137 QRS Duration:  103 QT Interval:  373 QTC Calculation: 428 R Axis:   78  Text Interpretation: Sinus rhythm Ventricular premature complex No significant change was found Confirmed by [name] [phone]) on 10/13/2022 1:02:30 AM  Radiology CT Angio Chest/Abd/Pel for Dissection W and/or Wo Contrast  Result Date: 10/13/2022 CLINICAL DATA:  Acute aortic syndrome, upper abdominal pain, upper back pain. Pancreatic cancer, leukocytosis. EXAM: CT ANGIOGRAPHY CHEST, ABDOMEN AND PELVIS TECHNIQUE: Non-contrast CT of the chest was initially obtained. Multidetector CT imaging through the chest, abdomen and pelvis was performed using the standard protocol during bolus administration of intravenous contrast. Multiplanar reconstructed images and MIPs were obtained and reviewed to evaluate the vascular anatomy. RADIATION DOSE REDUCTION: This exam was performed according to the departmental dose-optimization program which includes automated exposure control, adjustment of the mA and/or kV according to patient size and/or use of iterative reconstruction technique. CONTRAST:  OMNIPAQUE IOHEXOL 350 MG/ML SOLN COMPARISON:  None Available. FINDINGS: CTA  CHEST FINDINGS Cardiovascular: The thoracic aorta is normal in course and caliber. No intramural hematoma, dissection, or aneurysm. Mild atherosclerotic calcification. Arch vasculature demonstrates classic anatomic configuration and is widely patent proximally. Mild aortic valvular calcification. No significant coronary artery calcification. Global cardiac size is within normal limits. No pericardial effusion. There is adequate opacification of the pulmonary arterial tree no intraluminal filling defect is identified through the segmental level to suggest acute pulmonary embolism. Central pulmonary arteries are of normal caliber. Mediastinum/Nodes: No enlarged mediastinal, hilar, or axillary lymph nodes. Thyroid gland, trachea, and esophagus demonstrate no significant findings. Lungs/Pleura: 10 mm sub solid pulmonary nodule is seen within the right lower lobe, axial image # 72/6, stable since prior examination. 5 mm noncalcified subpleural pulmonary nodule within the right lower lobe, axial image # 94/6, stable since prior examination. Lungs are otherwise clear. No confluent pulmonary infiltrate. No pneumothorax or pleural effusion. Central airways are widely patent. Musculoskeletal: Osseous structures are age-appropriate. No acute bone abnormality. Review of the MIP images confirms the above findings. CTA ABDOMEN AND PELVIS FINDINGS VASCULAR Aorta: Normal caliber aorta without aneurysm, dissection, vasculitis or significant stenosis. Moderate atherosclerotic calcification Celiac: Patent without evidence of aneurysm, dissection, vasculitis or significant stenosis. SMA: Patent without evidence of aneurysm, dissection, vasculitis or significant stenosis. Renals: Both renal arteries are patent without evidence of aneurysm, dissection, vasculitis, fibromuscular dysplasia or significant stenosis. IMA: Patent without evidence of aneurysm, dissection, vasculitis or significant stenosis. Inflow: Patent without evidence of  aneurysm, dissection, vasculitis or significant stenosis. Veins: No obvious venous abnormality within the limitations of this arterial phase study. Review of the MIP images confirms the above findings. NON-VASCULAR Hepatobiliary: Internal biliary stent is again seen extending from the mid common duct to the third portion of the duodenum. However, since the prior examination, there has developed progressive gallbladder and biliary distension and near complete resolution of pneumobilia suggesting occlusion of the stent. No enhancing intrahepatic mass. The gallbladder is otherwise unremarkable. Pancreas: The main pancreatic duct is markedly dilated throughout its course and there is marked atrophy of the pancreas, similar to prior MRI examination of 09/01/2022 with a poorly defined mass within the pancreatic head better demonstrated on prior examination. Spleen: Unremarkable Adrenals/Urinary Tract: The adrenal glands are unremarkable. The kidneys are normal in size and position.  Hemorrhagic cyst noted within the posterior interpolar region of the left kidney better demonstrated on prior MRI examination 09/01/2022. The kidneys are otherwise unremarkable. Bladder unremarkable. Stomach/Bowel: Mild-to-moderate distal colonic diverticulosis, most severe within the sigmoid colon. The stomach, small bowel, and large bowel are otherwise unremarkable. No evidence of obstruction or focal inflammation. Appendix normal. No free intraperitoneal gas or fluid. Lymphatic: No pathologic adenopathy within the abdomen and pelvis. Shotty peripancreatic adenopathy with adjacent to the head of the pancreas within the aortocaval region is nonspecific,. Reproductive: Prostate is unremarkable. Other: Small fat containing right inguinal hernia Musculoskeletal: Degenerative changes seen within the lumbar spine. No acute bone abnormality. No lytic or blastic bone lesion. Review of the MIP images confirms the above findings. IMPRESSION: 1. No  evidence of thoracoabdominal aortic aneurysm or dissection. 2. No pulmonary embolism. 3. Progressive gallbladder and biliary distension and near complete resolution of pneumobilia suggesting occlusion of the internal biliary stent. Correlation with liver enzymes and possible hepatobiliary scintigraphy would be helpful to confirm an obstructive process. 4. Poorly defined mass within the pancreatic head better demonstrated on prior MRI examination of 09/01/2022 with associated marked dilation of the main pancreatic duct and marked atrophy of the pancreas, similar to prior examination. 5. Stable right lower lobe pulmonary nodules, indeterminate. Close attention on subsequent surveillance/restaging examination is warranted. If none are planned, a follow-up evaluation in 6 months is recommended for further evaluation. 6. Mild-to-moderate distal colonic diverticulosis without superimposed acute inflammatory change. Aortic Atherosclerosis (ICD10-I70.0). Electronically Signed   By: Helyn Numbers M.D.   On: 10/13/2022 02:08    Procedures Procedures    Medications Ordered in ED Medications - No data to display  ED Course/ Medical Decision Making/ A&P                                 Medical Decision Making Amount and/or Complexity of Data Reviewed Labs: ordered. Decision-making details documented in ED Course. Radiology: ordered and independent interpretation performed. Decision-making details documented in ED Course. ECG/medicine tests: ordered and independent interpretation performed. Decision-making details documented in ED Course.  Risk Prescription drug management. Decision regarding hospitalization.   Upper abdominal pain with known history of pancreatic cancer, now resolved.  Pain lasted for several hours and is now resolved.  Vital stable, no distress  Leukocytosis of 20.  Bilirubin has increased to 4.6 from 1.6.  EKG is sinus rhythm.  No evidence of ACS.  Leukocytosis of 20.  Elevated  transaminitis with increasing bilirubin.  CT scan today shows no acute vascular pathology.  No evidence of aortic aneurysm or dissection. No evidence of pulmonary embolism.  There is suspicion of occlusion outpatient biliary stent.  Labs today showed leukocytosis of 20.  Elevated transaminitis with increasing bilirubin compared to last week.  No fever. Empiric zosyn given but low concern at this time for cholangitis.   Empiric antibiotics given though the suspicion for cholangitis is low. CT scan is concerning for occlusion of biliary stent as above.  With rising bilirubin there is concern that the stent needs to be exchanged.  Discussed with Dr. Cherly Hensen of oncology who agrees and recommends admission for GI evaluation in the morning.  D/w Dr. Julian Reil       Final Clinical Impression(s) / ED Diagnoses Final diagnoses:  Obstructive jaundice    Rx / DC Orders ED Discharge Orders     None         Ledon Weihe, Jeannett Senior,  MD 10/13/22 0500    [name], MD 10/13/22 [phone]

## 2022-10-13 NOTE — H&P (Addendum)
History and Physical  Alexander [name] GLO:[id number] DOB: [birth year] DOA: 10/12/2022  PCP: Shirline Frees, NP   Chief Complaint: Abdominal pain  HPI: Alexander [name] is a 82 y.o. male with medical history significant for CAD, GERD, hypertension, recent diagnosis of diabetes and pancreatic head adenocarcinoma being admitted to the hospital with abdominal pain and concern for biliary stent occlusion.  Patient has been followed by Dr. Myna Hidalgo for polycythemia vera, underwent ERCP with stenting and sphincterotomy during her recent hospital stay when he was admitted with jaundice, weight loss and pancreatic head mass.  He later had endoscopic ultrasound which demonstrated 3 cm mass in the pancreatic head and biopsy.  He is scheduled to get a second opinion oncology at [facility] next week.  In the meantime he has been doing well, has been very active, in fact yesterday he walked about 4 miles, but had sudden onset of severe epigastric abdominal pain.  He denies any associated nausea, recent fevers, chills, diarrhea, or any other concerns.  He called EMS and was given a dose of IV fentanyl en route to the hospital.  He says that after this, his pain completely resolved and since that time he has been asymptomatic.  ED Course: In the emergency department, he has been afebrile with normal vital signs.  Lab work including CBC and CMP shows WBC 20, sodium 134, normal renal function, alk phos 457, AST and ALT 100/100 total bilirubin 4.6.  CT of the abdomen and pelvis with biliary obstruction as noted below.  ER provider notified oncology of the patient's admission, also consulted gastroenterology.  Review of Systems: Please see HPI for pertinent positives and negatives. A complete 10 system review of systems are otherwise negative.  Past Medical History:  Diagnosis Date   Arthritis    "minor; shoulders primarily" (05/20/2013)   CAD (coronary artery disease)    a. 05/2013 - Chest pain/unstable angina and  dynamic EKG changes showing cath showing atherosclerotic coronary artery disease manifested as diffuse ectasia, normal EF.   Diverticulosis of colon    GERD (gastroesophageal reflux disease)    GI bleed    secondary to Aspirin   Gout    Hematuria, microscopic    Hemorrhoids    Hyperglycemia    Hyperlipidemia    Hypertension    Lumbar back pain    Microalbuminuria    Overweight(278.02)    Pancreatic cancer (HCC) 09/22/2022   Polycythemia rubra vera (HCC)    Tubular adenoma of colon 07/2012   Past Surgical History:  Procedure Laterality Date   BILIARY STENT PLACEMENT N/A 09/02/2022   Procedure: BILIARY STENT PLACEMENT;  Surgeon: Hilarie Fredrickson, MD;  Location: [facility];  Service: Gastroenterology;  Laterality: N/A;   BIOPSY  09/11/2022   Procedure: BIOPSY;  Surgeon: Meridee Score Netty Starring., MD;  Location: [facility];  Service: Gastroenterology;;   CARDIAC CATHETERIZATION  05/20/2013   CATARACT EXTRACTION W/ INTRAOCULAR LENS  IMPLANT, BILATERAL Bilateral 2008   CYSTECTOMY  ~ 2000   " SEBACEOUS cyst removed from between shoulder blades"   ENDOSCOPIC RETROGRADE CHOLANGIOPANCREATOGRAPHY (ERCP) WITH PROPOFOL N/A 09/02/2022   Procedure: ENDOSCOPIC RETROGRADE CHOLANGIOPANCREATOGRAPHY (ERCP) WITH PROPOFOL;  Surgeon: Hilarie Fredrickson, MD;  Location: [facility];  Service: Gastroenterology;  Laterality: N/A;   ESOPHAGOGASTRODUODENOSCOPY N/A 09/11/2022   Procedure: ESOPHAGOGASTRODUODENOSCOPY (EGD);  Surgeon: Lemar Lofty., MD;  Location: [facility];  Service: Gastroenterology;  Laterality: N/A;   EUS N/A 09/11/2022   Procedure: UPPER ENDOSCOPIC ULTRASOUND (EUS) RADIAL;  Surgeon: Lemar Lofty., MD;  Location: [facility];  Service: Gastroenterology;  Laterality: N/A;   EXCISIONAL HEMORRHOIDECTOMY  1970's   FINE NEEDLE ASPIRATION N/A 09/11/2022   Procedure: FINE NEEDLE ASPIRATION (FNA) LINEAR;  Surgeon: Lemar Lofty., MD;  Location: [facility];  Service:  Gastroenterology;  Laterality: N/A;   INGUINAL HERNIA REPAIR Right ~ 2010   LEFT HEART CATHETERIZATION WITH CORONARY ANGIOGRAM N/A 05/20/2013   Procedure: LEFT HEART CATHETERIZATION WITH CORONARY ANGIOGRAM;  Surgeon: Peter M Swaziland, MD;  Location: [facility] LAB;  Service: Cardiovascular;  Laterality: N/A;   SPHINCTEROTOMY  09/02/2022   Procedure: SPHINCTEROTOMY;  Surgeon: Hilarie Fredrickson, MD;  Location: [facility];  Service: Gastroenterology;;   TONSILLECTOMY AND ADENOIDECTOMY  1940's   VASECTOMY      Social History:  reports that he has never smoked. He has never used smokeless tobacco. He reports that he does not drink alcohol and does not use drugs.   Allergies  Allergen Reactions   Metformin And Related Diarrhea   Metformin Hcl Diarrhea    Family History  Problem Relation Age of Onset   Colon cancer Mother        died at 59, colorectal   Diabetes Mother    Heart disease Father 65       MI   Diabetes Father    Stomach cancer Maternal Grandfather    Cancer Maternal Uncle      Prior to Admission medications   Medication Sig Start Date End Date Taking? Authorizing Provider  amLODipine (NORVASC) 10 MG tablet TAKE ONE TABLET BY MOUTH DAILY 03/21/22  Yes Nafziger, Kandee Keen, NP  glipiZIDE (GLUCOTROL XL) 10 MG 24 hr tablet Take 10 mg by mouth in the morning and at bedtime.   Yes [provider]  lipase/protease/amylase (CREON) 36000 UNITS CPEP capsule Take 2 capsules (72,000 Units total) by mouth 3 (three) times daily with meals. May also take 1 capsule (36,000 Units total) as needed (with snacks - up to 4 snacks daily). 10/05/22  Yes Josph Macho, MD  pantoprazole (PROTONIX) 40 MG tablet Take 1 tablet (40 mg total) by mouth 2 (two) times daily before a meal. 09/11/22  Yes Mansouraty, Netty Starring., MD  Accu-Chek Softclix Lancets lancets Use to test blood glucose once daily 11/09/21   Nafziger, Kandee Keen, NP  Blood Glucose Calibration (EMBRACE PRO GLUCOSE CONTROL) LIQD Use to check blood  glucose TID Patient not taking: Reported on 10/09/2022 04/19/22   Shirline Frees, NP  Blood Glucose Monitoring Suppl (ACCU-CHEK AVIVA PLUS) w/Device KIT Use to test blood glucose once daily Patient not taking: Reported on 10/09/2022 12/11/17   Shirline Frees, NP  Continuous Glucose Sensor (FREESTYLE LIBRE 3 SENSOR) MISC 1 Device by Does not apply route every 14 (fourteen) days. Place 1 sensor on the skin every 14 days. Use to check glucose continuously 09/14/22   Nafziger, Kandee Keen, NP  glucose blood (EMBRACE PRO GLUCOSE TEST) test strip Use as instructed 04/19/22   Nafziger, Kandee Keen, NP  insulin glargine (LANTUS) 100 UNIT/ML injection Inject 0.1 mLs (10 Units total) into the skin daily. 10/10/22   Nafziger, Kandee Keen, NP  Insulin Pen Needle 29G X MISC Use daily for insulin pen 10/10/22   Shirline Frees, NP    Physical Exam: BP 121/73   Pulse 66   Temp 99 F (37.2 C) (Oral)   Resp (!) 21   SpO2 100%   General:  Alert, oriented, calm, in no acute distress, looks thin Cardiovascular: RRR, no murmurs or rubs, no peripheral edema  Respiratory:  clear to auscultation bilaterally, no wheezes, no crackles  Abdomen: soft, nontender, nondistended, normal bowel tones heard  Skin: dry, no rashes  Musculoskeletal: no joint effusions, normal range of motion  Psychiatric: appropriate affect, normal speech  Neurologic: extraocular muscles intact, clear speech, moving all extremities with intact sensorium         Labs on Admission:  Basic Metabolic Panel: Recent Labs  Lab 10/09/22 1443 10/12/22 2344  NA 134* 134*  K 3.9 3.5  CL 99 102  CO2 28 24  GLUCOSE 403* 234*  BUN 19 23  CREATININE 1.08 1.02  CALCIUM 8.8* 8.7*   Liver Function Tests: Recent Labs  Lab 10/09/22 1443 10/12/22 2344  AST 17 100*  ALT 35 100*  ALKPHOS 442* 457*  BILITOT 1.6* 4.6*  PROT 6.3* 6.6  ALBUMIN 3.3* 3.4*   Recent Labs  Lab 10/12/22 2344  LIPASE 18   No results for input(s): "AMMONIA" in the last 168  hours. CBC: Recent Labs  Lab 10/09/22 1443 10/12/22 2344  WBC 8.2 20.0*  NEUTROABS 5.8 18.4*  HGB 13.5 13.4  HCT 39.7 39.3  MCV 96.6 97.5  PLT 249 243   Cardiac Enzymes: No results for input(s): "CKTOTAL", "CKMB", "CKMBINDEX", "TROPONINI" in the last 168 hours.  BNP (last 3 results) No results for input(s): "BNP" in the last 8760 hours.  ProBNP (last 3 results) No results for input(s): "PROBNP" in the last 8760 hours.  CBG: No results for input(s): "GLUCAP" in the last 168 hours.  Radiological Exams on Admission: CT Angio Chest/Abd/Pel for Dissection W and/or Wo Contrast  Result Date: 10/13/2022 CLINICAL DATA:  Acute aortic syndrome, upper abdominal pain, upper back pain. Pancreatic cancer, leukocytosis. EXAM: CT ANGIOGRAPHY CHEST, ABDOMEN AND PELVIS TECHNIQUE: Non-contrast CT of the chest was initially obtained. Multidetector CT imaging through the chest, abdomen and pelvis was performed using the standard protocol during bolus administration of intravenous contrast. Multiplanar reconstructed images and MIPs were obtained and reviewed to evaluate the vascular anatomy. RADIATION DOSE REDUCTION: This exam was performed according to the departmental dose-optimization program which includes automated exposure control, adjustment of the mA and/or kV according to patient size and/or use of iterative reconstruction technique. CONTRAST:  OMNIPAQUE IOHEXOL 350 MG/ML SOLN COMPARISON:  None Available. FINDINGS: CTA CHEST FINDINGS Cardiovascular: The thoracic aorta is normal in course and caliber. No intramural hematoma, dissection, or aneurysm. Mild atherosclerotic calcification. Arch vasculature demonstrates classic anatomic configuration and is widely patent proximally. Mild aortic valvular calcification. No significant coronary artery calcification. Global cardiac size is within normal limits. No pericardial effusion. There is adequate opacification of the pulmonary arterial tree no  intraluminal filling defect is identified through the segmental level to suggest acute pulmonary embolism. Central pulmonary arteries are of normal caliber. Mediastinum/Nodes: No enlarged mediastinal, hilar, or axillary lymph nodes. Thyroid gland, trachea, and esophagus demonstrate no significant findings. Lungs/Pleura: 10 mm sub solid pulmonary nodule is seen within the right lower lobe, axial image # 72/6, stable since prior examination. 5 mm noncalcified subpleural pulmonary nodule within the right lower lobe, axial image # 94/6, stable since prior examination. Lungs are otherwise clear. No confluent pulmonary infiltrate. No pneumothorax or pleural effusion. Central airways are widely patent. Musculoskeletal: Osseous structures are age-appropriate. No acute bone abnormality. Review of the MIP images confirms the above findings. CTA ABDOMEN AND PELVIS FINDINGS VASCULAR Aorta: Normal caliber aorta without aneurysm, dissection, vasculitis or significant stenosis. Moderate atherosclerotic calcification Celiac: Patent without evidence of aneurysm, dissection, vasculitis or significant stenosis. SMA:  Patent without evidence of aneurysm, dissection, vasculitis or significant stenosis. Renals: Both renal arteries are patent without evidence of aneurysm, dissection, vasculitis, fibromuscular dysplasia or significant stenosis. IMA: Patent without evidence of aneurysm, dissection, vasculitis or significant stenosis. Inflow: Patent without evidence of aneurysm, dissection, vasculitis or significant stenosis. Veins: No obvious venous abnormality within the limitations of this arterial phase study. Review of the MIP images confirms the above findings. NON-VASCULAR Hepatobiliary: Internal biliary stent is again seen extending from the mid common duct to the third portion of the duodenum. However, since the prior examination, there has developed progressive gallbladder and biliary distension and near complete resolution of  pneumobilia suggesting occlusion of the stent. No enhancing intrahepatic mass. The gallbladder is otherwise unremarkable. Pancreas: The main pancreatic duct is markedly dilated throughout its course and there is marked atrophy of the pancreas, similar to prior MRI examination of 09/01/2022 with a poorly defined mass within the pancreatic head better demonstrated on prior examination. Spleen: Unremarkable Adrenals/Urinary Tract: The adrenal glands are unremarkable. The kidneys are normal in size and position. Hemorrhagic cyst noted within the posterior interpolar region of the left kidney better demonstrated on prior MRI examination 09/01/2022. The kidneys are otherwise unremarkable. Bladder unremarkable. Stomach/Bowel: Mild-to-moderate distal colonic diverticulosis, most severe within the sigmoid colon. The stomach, small bowel, and large bowel are otherwise unremarkable. No evidence of obstruction or focal inflammation. Appendix normal. No free intraperitoneal gas or fluid. Lymphatic: No pathologic adenopathy within the abdomen and pelvis. Shotty peripancreatic adenopathy with adjacent to the head of the pancreas within the aortocaval region is nonspecific,. Reproductive: Prostate is unremarkable. Other: Small fat containing right inguinal hernia Musculoskeletal: Degenerative changes seen within the lumbar spine. No acute bone abnormality. No lytic or blastic bone lesion. Review of the MIP images confirms the above findings. IMPRESSION: 1. No evidence of thoracoabdominal aortic aneurysm or dissection. 2. No pulmonary embolism. 3. Progressive gallbladder and biliary distension and near complete resolution of pneumobilia suggesting occlusion of the internal biliary stent. Correlation with liver enzymes and possible hepatobiliary scintigraphy would be helpful to confirm an obstructive process. 4. Poorly defined mass within the pancreatic head better demonstrated on prior MRI examination of 09/01/2022 with associated  marked dilation of the main pancreatic duct and marked atrophy of the pancreas, similar to prior examination. 5. Stable right lower lobe pulmonary nodules, indeterminate. Close attention on subsequent surveillance/restaging examination is warranted. If none are planned, a follow-up evaluation in 6 months is recommended for further evaluation. 6. Mild-to-moderate distal colonic diverticulosis without superimposed acute inflammatory change. Aortic Atherosclerosis (ICD10-I70.0). Electronically Signed   By: Helyn Numbers M.D.   On: 10/13/2022 02:08    Assessment/Plan  FARAZ PITTSENBARGER is a 82 y.o. male with medical history significant for CAD, GERD, hypertension, recent diagnosis of diabetes and pancreatic head adenocarcinoma being admitted to the hospital with abdominal pain and concern for biliary stent occlusion.   Biliary occlusion-in the setting of biliary stent, and known pancreatic head adenocarcinoma.  He has a significant leukocytosis but otherwise no evidence of cholangitis.  Currently he is comfortable without any pain. -Inpatient admission to MedSurg -Keep n.p.o. for now -Pain and nausea medication as needed -GI Dr. Russella Dar has been consulted, he would likely benefit from stent exchange  Elevated LFTs-due to obstructed biliary stent -Avoid hepatotoxins, trend labs  Pancreatic adenocarcinoma-under the care of Dr. Myna Hidalgo, and seeking second opinion and likely treatment at [facility]  Leukocytosis-without fever or other evidence of acute infection.  However certainly at risk for development  of cholangitis given biliary obstruction. -Will continue empiric IV Zosyn  Type 2 diabetes-hemoglobin A1c 7.5, but noted to have significant hyperglycemia in the oncology clinic.  Was recently prescribed Lantus 10 units daily, has not started this. -Lantus 5 units daily since n.p.o. -Moderate dose sliding scale insulin -Carb controlled diet when able to eat  DVT prophylaxis: Lovenox     Code Status:  Full Code  Consults called: Oncology, gastroenterology  Admission status: The appropriate patient status for this patient is INPATIENT. Inpatient status is judged to be reasonable and necessary in order to provide the required intensity of service to ensure the patient's safety. The patient's presenting symptoms, physical exam findings, and initial radiographic and laboratory data in the context of their chronic comorbidities is felt to place them at high risk for further clinical deterioration. Furthermore, it is not anticipated that the patient will be medically stable for discharge from the hospital within 2 midnights of admission.    I certify that at the point of admission it is my clinical judgment that the patient will require inpatient hospital care spanning beyond 2 midnights from the point of admission due to high intensity of service, high risk for further deterioration and high frequency of surveillance required  Time spent: 59 minutes  Zayli Villafuerte Sharlette Dense MD [facility] Pager [phone]  If 7PM-7AM, please contact night-coverage [email or web address] Password [email or web address]  10/13/2022, 8:01 AM

## 2022-10-13 NOTE — Anesthesia Procedure Notes (Signed)
Procedure Name: Intubation Date/Time: 10/13/2022 3:01 PM  Performed by: Kayleen Memos, CRNAPre-anesthesia Checklist: Patient identified, Emergency Drugs available, Suction available and Patient being monitored Patient Re-evaluated:Patient Re-evaluated prior to induction Oxygen Delivery Method: Circle System Utilized Preoxygenation: Pre-oxygenation with 100% oxygen Induction Type: IV induction Ventilation: Mask ventilation without difficulty Laryngoscope Size: Glidescope and 3 (elective glidescope) Tube type: Oral Tube size: 7.0 mm Number of attempts: 1 Airway Equipment and Method: Stylet Placement Confirmation: ETT inserted through vocal cords under direct vision, positive ETCO2 and breath sounds checked- equal and bilateral Secured at: 24 cm Tube secured with: Tape Dental Injury: Teeth and Oropharynx as per pre-operative assessment

## 2022-10-13 NOTE — Progress Notes (Signed)
Notified Loura Pardon - neighbor and emergency contact of pt's room assignment at [facility] per pt's request.  Eulas Post, RN 10/13/22 3:49 PM

## 2022-10-13 NOTE — Op Note (Signed)
[facility] Patient Name: Alexander [name] Procedure Date : 10/13/2022 MRN: [id number] Attending MD: Meryl Dare , MD, [phone] Date of Birth: Aug 24, 1940 CSN: [id number] Age: 82 Admit Type: Inpatient Procedure:                ERCP Indications:              Abdominal pain of suspected biliary origin,                            Elevated liver enzymes, Malignant tumor of the head                            of pancreas, Stent change Providers:                Venita Lick. Russella Dar, MD, Fransisca Connors, Adin Hector, RN, Salley Scarlet, Technician, Betsey Holiday,                            CRNA Referring MD:             Rose Phi. Myna Hidalgo, MD Medicines:                General Anesthesia Complications:            No immediate complications. Estimated Blood Loss:     Estimated blood loss: none. Procedure:                Pre-Anesthesia Assessment:                           - Prior to the procedure, a History and Physical                            was performed, and patient medications and                            allergies were reviewed. The patient's tolerance of                            previous anesthesia was also reviewed. The risks                            and benefits of the procedure and the sedation                            options and risks were discussed with the patient.                            All questions were answered, and informed consent                            was obtained. Prior Anticoagulants: The patient has  taken no anticoagulant or antiplatelet agents. ASA                            Grade Assessment: III - A patient with severe                            systemic disease. After reviewing the risks and                            benefits, the patient was deemed in satisfactory                            condition to undergo the procedure.                           After obtaining informed  consent, the scope was                            passed under direct vision. Throughout the                            procedure, the patient's blood pressure, pulse, and                            oxygen saturations were monitored continuously. The                            [employer] D single use                            duodenoscope was introduced through the mouth, and                            used to inject contrast into and used to inject                            contrast into the bile duct. The ERCP was                            accomplished without difficulty. The patient                            tolerated the procedure well. Scope In: Scope Out: Findings:      A biliary stent was visible on the scout film. The scope was advanced to       the major papilla in the descending duodenum. Prior sphincterotomy and       biliary stent noted. Very limited examination of the pharynx, larynx and       associated structures, and upper GI tract was otherwise normal. A       straight Roadrunner wire was passed into the biliary tree. The       short-nosed traction sphincterotome was passed over the guidewire and       the bile duct was then deeply cannulated. Contrast was injected. I  personally interpreted the bile duct images. Ductal flow of contrast was       adequate. The common bile duct was markedly dilated and diffusely       dilated, with a mass, stricture causing an obstruction. The largest       diameter was 15 mm. The lower third of the main bile duct contained a       single severe stenosis. The biliary tree was otherwise normal however       the intrahepatic ducts were not adequately filled. The biliary tree       contained one plastic stent. This was found to be visibly occluded. The       stent was removed using a rat-toothed forceps. The stent became lodged       in the channel, the scope was removed from the patient and a new EXALT        Model D duodenoscope was used to complete the procedure. A 10 mm by 6 cm       partially covered metal stent was placed 5 cm into the common bile duct.       Pus and dark bile flowed through the stent. The stent was in good       position. Very good biliary drainage was noted. The PD was not entered       by intention. Photos of the SEMS in position did not capture. Impression:               - Prior sphincterotomy noted.                           - A single severe biliary stricture was found in                            the lower third of the main bile duct. The                            stricture was malignant appearing.                           - The common bile duct proximal to the stricture                            was markedly dilated with a mass, stricture causing                            an obstruction.                           - One stent was exchanged in the common bile duct                            with a partially covered SEMS placed.                           - Cholangitis. Recommendation:           - Return patient to hospital ward for ongoing care.                           -  Observe patient's clinical course following                            today's ERCP with therapeutic intervention.                           - Continue IV Zosyn for now. Change to po                            antibiotics at discharge to complete a 5 day course.                           - Follow up with Dr. Myna Hidalgo.                           - Outpatient GI follow up not needed at this time. Procedure Code(s):        --- Professional ---                           [phone], Endoscopic retrograde                            cholangiopancreatography (ERCP); with removal and                            exchange of stent(s), biliary or pancreatic duct,                            including pre- and post-dilation and guide wire                            passage, when performed, including sphincterotomy,                             when performed, each stent exchanged                           [phone], Endoscopic catheterization of the biliary                            ductal system, radiological supervision and                            interpretation Diagnosis Code(s):        --- Professional ---                           K83.1, Obstruction of bile duct                           R10.9, Unspecified abdominal pain                           R74.8, Abnormal levels of other serum enzymes  C25.0, Malignant neoplasm of head of pancreas                           Z46.59, Encounter for fitting and adjustment of                            other gastrointestinal appliance and device CPT copyright 2022 [employer]. All rights reserved. The codes documented in this report are preliminary and upon coder review may  be revised to meet current compliance requirements. Meryl Dare, MD 10/13/2022 3:59:42 PM This report has been signed electronically. Number of Addenda: 0

## 2022-10-13 NOTE — Consult Note (Addendum)
Consultation  Primary Care Physician:  Shirline Frees, NP Primary Gastroenterologist:  Dr. Russella Dar       Reason for Consultation: Epigastric pain with worsening bilirubin and LFTs, concern for stent occlusion.  DOA: 10/12/2022         Hospital Day: 2         HPI:   Alexander [name] is a 82 y.o. male with past medical history significant for DM2, polycythemia vera, HTN, CAD, GERD, gout,diverticulosis, multiple adenomatous polyps,pancreatic head adenocarcinoma, and chronic back pain. Presents to the ER with epigastric pain and elevated LFT's. GI consulted.  Work up notable for sodium 134.  Calcium 8.7.  Normal kidney function.  Alk phos 457.  Albumin 3.4.  AST 100.  ALT 100.  Previously normal on 10/7. total bilirubin 4.6.  Previously 1.6 on 10/7.  WBC 20.  RBCs 4.03.  Normal hemoglobin.  Platelets normal. EKG is sinus rhythm. CT scam 10/11 Progressive gallbladder and biliary distension and near complete resolution of pneumobilia suggesting occlusion of the internal biliary stent.   Patient was recently in hospital on 09/01/2022 for jaundice, bilirubin 12.5, and elevated LFTs elevated as well.  Had lost about 30 to 35 pounds.  CT scan showed a 3 cm pancreatic head mass favoring pancreatic adenocarcinoma with severe pancreatic duct dilatation prominent intra and extrahepatic biliary ductal dilatation extending down to the distal CBD.   MRCP showed Small, ill-defined, subtly diffusion restricting and hypo enhancing mass in the inferior pancreatic head and uncinate,difficult to precisely delineate although measuring approximately 2.5 x 2.0 x 1.9 cm. Abrupt truncation of the central pancreatic duct with severe pancreatic ductal dilatation and atrophy of the pancreas distally. ERCP on 8/31 Malignant appearing distal biliary stricture ( likely pancreatic cancer) with biliary obstruction. Status post ERCP with sphincterotomy and biliary stent placement. Upper endoscopic [place] 9/9 A mass was  identified in the pancreatic head. This was staged T2 N0 Mx by endosonographic criteria. One stent was visualized endosonographically in the common bile duct. 2cm HH. Moderate gastritis. Enlarged lymph nodes. (See full reports below) Cytology diagnosis Adenocarcinoma CA 19-9 0n 10/7 281 CEA 3.5  Patient lying in bed, in and daughter at bedside.  Patient reports went on his regular 3-1/2 mile walk yesterday with his granddaughter and started to experience epigastric pain that radiated to his back.  Patient reports he came home and had food which resulted in severe epigastric pain that became intolerable.  No nausea or vomiting.  No fever or chills.  Reports 1 bowel movement this morning.  No pruritus or jaundice. Reports he has been taking pancreatic enzymes with meals which has helped with consistency of stools, light colored stools. Urine yellow color. Since he received Fentanyl via EMS yesterday he has not had any more epigastric pain.   Previous GI workup:   10/11 CT ANGIOGRAPHY CHEST, ABDOMEN AND PELVIS  IMPRESSION: 1. No evidence of thoracoabdominal aortic aneurysm or dissection. 2. No pulmonary embolism. 3. Progressive gallbladder and biliary distension and near complete resolution of pneumobilia suggesting occlusion of the internal biliary stent. Correlation with liver enzymes and possible hepatobiliary scintigraphy would be helpful to confirm an obstructive process. 4. Poorly defined mass within the pancreatic head better demonstrated on prior MRI examination of 09/01/2022 with associated marked dilation of the main pancreatic duct and marked atrophy of the pancreas, similar to prior examination. 5. Stable right lower lobe pulmonary nodules, indeterminate. Close attention on subsequent surveillance/restaging examination is warranted. If none are planned, a  follow-up evaluation in 6 months is recommended for further evaluation. 6. Mild-to-moderate distal colonic diverticulosis  without superimposed acute inflammatory change.   Aortic Atherosclerosis (ICD10-I70.0).  10/04/2022 CT CHEST WITH CONTRAST/ CT ABDOMEN AND PELVIS WITH CONTRAST  IMPRESSION: 1. The small lesion within the head of pancreas is suboptimally visualized on the current exam. As noted on the previous MRI this is difficult to precisely delineate and therefore accurately compare with previous imaging for interval growth. Best estimate is that this measures approximately 3.6 x 2.5 x 2.5 cm. Compared with the previous exam this appears similar in size (when remeasured). As mentioned previously the tumor appears to contact (but does not encase) the superior mesenteric vein near the portal venous confluence. There is also a small finger-like a prior projection (from the mass) of soft tissue extending up to and potentially contacting the superior mesenteric artery without encasement. 2. No signs of metastatic disease within the abdomen or pelvis. 3. There is a 1.2 cm non-solid nodule within the posterolateral right lower lobe. 4 mm internal solid component is suspected. This is indeterminate. Follow-up non-contrast CT recommended at 3-6 months to confirm persistence. 4. Common bile duct stent is in place. Pneumobilia is identified compatible with biliary patency. There is persistent mild intrahepatic bile duct dilatation which is improved compared with 09/01/2022. 5. Coronary artery calcifications. 6. Within the anterior right lower lobe there is a 5 mm solid nodule which abuts the major fissure. This appears unchanged from 08/31/2022. Nonspecific. Attention on follow-up imaging advised. 7.  Aortic Atherosclerosis (ICD10-I70.0).  8/29 CT scan abdomen and pelvis    IMPRESSION: 1. 3 cm pancreatic head mass strongly favoring pancreatic adenocarcinoma. There is severe dorsal pancreatic duct dilatation and prominent intrahepatic and extrahepatic biliary dilatation extending down to the distal  common bile duct in the vicinity of the pancreatic head mass. Other characteristics detailed above. 2. 3.2 by 1.8 cm homogeneous hyperdense lesion of the left kidney posteriorly, internal density 50 Hounsfield units, likely a complex cyst given the homogeneity, but technically nonspecific. This and the pancreatic lesion could be further characterized with dedicated pancreatic protocol MRI with and without contrast. 3. Sigmoid colon diverticulosis. 4. Lower rectal and anal wall thickening circumferentially, likely from nondistention but technically nonspecific, correlate with colon cancer screening history. 5. Bilateral mild foraminal impingement at L5-S1 due to intervertebral and facet spurring. 6. Aortic atherosclerosis.   Aortic Atherosclerosis (ICD10-I70.0).   09/01/22 MRI abdomen/MRCP as follows:   IMPRESSION: 1. Small, ill-defined, subtly diffusion restricting and hypoenhancing mass in the inferior pancreatic head and uncinate, difficult to precisely delineate although measuring approximately 2.5 x 2.0 x 1.9 cm. Abrupt truncation of the central pancreatic duct with severe pancreatic ductal dilatation and atrophy of the pancreas distally. 2. Severe intra and extrahepatic biliary ductal dilatation, the common bile duct abruptly truncated within the central pancreatic head. 3. Findings consistent with pancreatic adenocarcinoma. 4. For purposes of local staging, mass most likely makes minimal contact to the posterior superior mesenteric vein near the confluence of the portal vein but appears to have preserved fat planes to the adjacent superior mesenteric artery and the portal vein proper. 5. No evidence of lymphadenopathy or organ metastatic disease in the abdomen. 6. Large burden of stool in the colon.   Aortic Atherosclerosis (ICD10-I70.0).   09/11/22 Upper Endoscopic US  Impression: EGD Impression: - No gross lesions in the entire esophagus. Z- line regular, 37 cm from  the incisors. - 2 cm hiatal hernia. - Moderate gastritis and erosions noted in  the body/ antrum. No other gross lesions in the entire stomach. Biopsied. - No gross lesions in the duodenal bulb, in the first portion of the duodenum and in the second portion of the duodenum. - Plastic biliary stent at the major papilla.  EUS Impression: - A mass was identified in the pancreatic head ( where CBD/ PD dilation is noted) . Cytology results are pending. However, the endosonographic appearance is suspicious for adenocarcinoma. Fine needle biopsy performed of the mass. This was staged T2 N0 Mx by endosonographic criteria. The staging applies if malignancy is confirmed. However see notation of the LN below. - One stent was visualized endosonographically in the common bile duct. - Hyperechoic material consistent with sludge was visualized endosonographically in the gallbladder. - Two enlarged lymph nodes were visualized in the porta hepatis region and although they are hypoechoic and could be concerning for disease, they do not meet EUS size criteria for metastatic components ( need to be 1 cm in size) .  09/11/2022 FINAL MICROSCOPIC DIAGNOSIS:   A. STOMACH, BIOPSY:       Gastric antral mucosa with focal mild chronic inactive gastritis  and proton pump inhibitor effect.       No H. pylori identified on HE.       Negative for intestinal metaplasia or dysplasia.   Clinical History: Pancreatic lesion; hiatal hernia 2 cm, gastritis,  gastric biopsy r/o h pylori  Specimen Submitted:  A. PANCREAS, HEAD MASS, FINE NEEDLE ASPIRATION:   FINAL MICROSCOPIC DIAGNOSIS:  - Adenocarcinoma   09/02/22 ERCP Impression: 1. Malignant appearing distal biliary stricture ( likely pancreatic cancer) with biliary obstruction. 2. Status post ERCP with sphincterotomy and biliary stent placement.  08/01/13 Flex sigmoidoscopy ENDOSCOPIC IMPRESSION: 1. Moderate diverticulosis in the descending colon and sigmoid colon 2. Large internal  hemorrhoids, moderate external hemorrhoids.   Past Medical History:  Diagnosis Date   Arthritis    "minor; shoulders primarily" (05/20/2013)   CAD (coronary artery disease)    a. 05/2013 - Chest pain/unstable angina and dynamic EKG changes showing cath showing atherosclerotic coronary artery disease manifested as diffuse ectasia, normal EF.   Diverticulosis of colon    GERD (gastroesophageal reflux disease)    GI bleed    secondary to Aspirin   Gout    Hematuria, microscopic    Hemorrhoids    Hyperglycemia    Hyperlipidemia    Hypertension    Lumbar back pain    Microalbuminuria    Overweight(278.02)    Pancreatic cancer (HCC) 09/22/2022   Polycythemia rubra vera (HCC)    Tubular adenoma of colon 07/2012    Surgical History:  He  has a past surgical history that includes Cataract extraction w/ intraocular lens  implant, bilateral (Bilateral, 2008); Excisional hemorrhoidectomy (1970's); Cardiac catheterization (05/20/2013); Inguinal hernia repair (Right, ~ 2010); Tonsillectomy and adenoidectomy (1940's); Cystectomy (~ 2000); left heart catheterization with coronary angiogram (N/A, 05/20/2013); Vasectomy; Endoscopic retrograde cholangiopancreatography (ercp) with propofol (N/A, 09/02/2022); sphincterotomy (09/02/2022); biliary stent placement (N/A, 09/02/2022); EUS (N/A, 09/11/2022); Esophagogastroduodenoscopy (N/A, 09/11/2022); biopsy (09/11/2022); and Fine needle aspiration (N/A, 09/11/2022). Family History:  His family history includes Cancer in his maternal uncle; Colon cancer in his mother; Diabetes in his father and mother; Heart disease (age of onset: 13) in his father; Stomach cancer in his maternal grandfather. Social History:   reports that he has never smoked. He has never used smokeless tobacco. He reports that he does not drink alcohol and does not use drugs.  Prior to Admission medications  Medication Sig Start Date End Date Taking? Authorizing Provider  amLODipine (NORVASC) 10 MG  tablet TAKE ONE TABLET BY MOUTH DAILY 03/21/22  Yes Nafziger, Kandee Keen, NP  glipiZIDE (GLUCOTROL XL) 10 MG 24 hr tablet Take 10 mg by mouth in the morning and at bedtime.   Yes [provider]  lipase/protease/amylase (CREON) 36000 UNITS CPEP capsule Take 2 capsules (72,000 Units total) by mouth 3 (three) times daily with meals. May also take 1 capsule (36,000 Units total) as needed (with snacks - up to 4 snacks daily). 10/05/22  Yes Josph Macho, MD  pantoprazole (PROTONIX) 40 MG tablet Take 1 tablet (40 mg total) by mouth 2 (two) times daily before a meal. 09/11/22  Yes Mansouraty, Netty Starring., MD  Accu-Chek Softclix Lancets lancets Use to test blood glucose once daily 11/09/21   Nafziger, Kandee Keen, NP  Blood Glucose Calibration (EMBRACE PRO GLUCOSE CONTROL) LIQD Use to check blood glucose TID Patient not taking: Reported on 10/09/2022 04/19/22   Shirline Frees, NP  Blood Glucose Monitoring Suppl (ACCU-CHEK AVIVA PLUS) w/Device KIT Use to test blood glucose once daily Patient not taking: Reported on 10/09/2022 12/11/17   Shirline Frees, NP  Continuous Glucose Sensor (FREESTYLE LIBRE 3 SENSOR) MISC 1 Device by Does not apply route every 14 (fourteen) days. Place 1 sensor on the skin every 14 days. Use to check glucose continuously 09/14/22   Nafziger, Kandee Keen, NP  glucose blood (EMBRACE PRO GLUCOSE TEST) test strip Use as instructed 04/19/22   Nafziger, Kandee Keen, NP  insulin glargine (LANTUS) 100 UNIT/ML injection Inject 0.1 mLs (10 Units total) into the skin daily. 10/10/22   Nafziger, Kandee Keen, NP  Insulin Pen Needle 29G X MISC Use daily for insulin pen 10/10/22   Nafziger, Kandee Keen, NP    Current Facility-Administered Medications  Medication Dose Route Frequency Provider Last Rate Last Admin   albuterol (PROVENTIL) (2.5 MG/3ML) 0.083% nebulizer solution 2.5 mg  2.5 mg Nebulization Q2H PRN Kirby Crigler, Mir M, MD       amLODipine (NORVASC) tablet 10 mg  10 mg Oral Daily Kirby Crigler, Mir M, MD       enoxaparin  (LOVENOX) injection 40 mg  40 mg Subcutaneous Q24H Kirby Crigler, Mir M, MD       HYDROmorphone (DILAUDID) injection 0.5-1 mg  0.5-1 mg Intravenous Q2H PRN Kirby Crigler, Mir M, MD       ibuprofen (ADVIL) tablet 400 mg  400 mg Oral Q6H PRN Kirby Crigler, Mir M, MD       insulin aspart (novoLOG) injection 0-15 Units  0-15 Units Subcutaneous TID WC Kirby Crigler, Mir M, MD       insulin aspart (novoLOG) injection 0-5 Units  0-5 Units Subcutaneous QHS Kirby Crigler, Mir M, MD       insulin glargine-yfgn [facility]) injection 5 Units  5 Units Subcutaneous Daily Kirby Crigler, Mir M, MD       ondansetron [facility]) tablet 4 mg  4 mg Oral Q6H PRN Kirby Crigler, Mir M, MD       Or   ondansetron [facility]) injection 4 mg  4 mg Intravenous Q6H PRN Kirby Crigler, Mir M, MD       oxyCODONE (Oxy IR/ROXICODONE) immediate release tablet 5 mg  5 mg Oral Q4H PRN Kirby Crigler, Mir M, MD       pantoprazole (PROTONIX) EC tablet 40 mg  40 mg Oral BID AC Kirby Crigler, Mir M, MD   40 mg at 10/13/22 0831   piperacillin-tazobactam (ZOSYN) IVPB 3.375 g  3.375 g Intravenous Q8H [facility] [name]  traZODone (DESYREL) tablet 25 mg  25 mg Oral QHS PRN Kirby Crigler, Mir M, MD       Current Outpatient Medications  Medication Sig Dispense Refill   amLODipine (NORVASC) 10 MG tablet TAKE ONE TABLET BY MOUTH DAILY 90 tablet 3   glipiZIDE (GLUCOTROL XL) 10 MG 24 hr tablet Take 10 mg by mouth in the morning and at bedtime.     lipase/protease/amylase (CREON) 36000 UNITS CPEP capsule Take 2 capsules (72,000 Units total) by mouth 3 (three) times daily with meals. May also take 1 capsule (36,000 Units total) as needed (with snacks - up to 4 snacks daily). 180 capsule 0   pantoprazole (PROTONIX) 40 MG tablet Take 1 tablet (40 mg total) by mouth 2 (two) times daily before a meal. 60 tablet 6   Accu-Chek Softclix Lancets lancets Use to test blood glucose once daily 100 each 3   Blood Glucose Calibration (EMBRACE PRO GLUCOSE CONTROL) LIQD Use to check blood glucose  TID (Patient not taking: Reported on 10/09/2022) 1 each    Blood Glucose Monitoring Suppl (ACCU-CHEK AVIVA PLUS) w/Device KIT Use to test blood glucose once daily (Patient not taking: Reported on 10/09/2022) 1 kit 0   Continuous Glucose Sensor (FREESTYLE LIBRE 3 SENSOR) MISC 1 Device by Does not apply route every 14 (fourteen) days. Place 1 sensor on the skin every 14 days. Use to check glucose continuously 2 each 6   glucose blood (EMBRACE PRO GLUCOSE TEST) test strip Use as instructed 100 each 12   insulin glargine (LANTUS) 100 UNIT/ML injection Inject 0.1 mLs (10 Units total) into the skin daily. 10 mL 1   Insulin Pen Needle 29G X MISC Use daily for insulin pen 90 each 1    Allergies as of 10/12/2022 - Review Complete 10/12/2022  Allergen Reaction Noted   Metformin and related Diarrhea 06/06/2012   Metformin hcl Diarrhea 10/07/2020    Review of Systems:    Constitutional: No weight loss, fever, chills, weakness or fatigue HEENT: Eyes: No change in vision               Ears, Nose, Throat:  No change in hearing or congestion Skin: No rash or itching Cardiovascular: No chest pain, chest pressure or palpitations   Respiratory: No SOB or cough Gastrointestinal: See HPI and otherwise negative Genitourinary: No dysuria or change in urinary frequency Neurological: No headache, dizziness or syncope Musculoskeletal: No new muscle or joint pain Hematologic: No bleeding or bruising Psychiatric: No history of depression or anxiety     Physical Exam:  Vital signs in last 24 hours: Temp:  [98.9 F (37.2 C)-99.2 F (37.3 C)] 99 F (37.2 C) (10/11 0742) Pulse Rate:  [65-83] 66 (10/11 0742) Resp:  [11-21] 21 (10/11 0742) BP: (110-155)/(70-86) 121/73 (10/11 0742) SpO2:  [97 %-100 %] 100 % (10/11 0742)   Last BM recorded by nurses in past 5 days No data recorded  General:   Pleasant, well developed male in no acute distress Head:  Normocephalic and atraumatic. Eyes: sclerae  anicteric Heart:  regular rate and rhythm Pulm: Clear anteriorly; no wheezing Abdomen:  Soft, Non-distended AB, Active bowel sounds. No tenderness    .Without guarding and Without rebound, No organomegaly appreciated. Extremities:  Without edema. Msk:  Symmetrical without gross deformities. Peripheral pulses intact.  Neurologic:  Alert and  oriented x4;  No focal deficits.  Skin:   Dry and intact without significant lesions or rashes. Psychiatric:  Cooperative. Normal mood and affect.  LAB  RESULTS: Recent Labs    10/12/22 2344  WBC 20.0*  HGB 13.4  HCT 39.3  PLT 243   BMET Recent Labs    10/12/22 2344  NA 134*  K 3.5  CL 102  CO2 24  GLUCOSE 234*  BUN 23  CREATININE 1.02  CALCIUM 8.7*   LFT Recent Labs    10/12/22 2344  PROT 6.6  ALBUMIN 3.4*  AST 100*  ALT 100*  ALKPHOS 457*  BILITOT 4.6*   PT/INR No results for input(s): "LABPROT", "INR" in the last 72 hours.  STUDIES: CT Angio Chest/Abd/Pel for Dissection W and/or Wo Contrast  Result Date: 10/13/2022 CLINICAL DATA:  Acute aortic syndrome, upper abdominal pain, upper back pain. Pancreatic cancer, leukocytosis. EXAM: CT ANGIOGRAPHY CHEST, ABDOMEN AND PELVIS TECHNIQUE: Non-contrast CT of the chest was initially obtained. Multidetector CT imaging through the chest, abdomen and pelvis was performed using the standard protocol during bolus administration of intravenous contrast. Multiplanar reconstructed images and MIPs were obtained and reviewed to evaluate the vascular anatomy. RADIATION DOSE REDUCTION: This exam was performed according to the departmental dose-optimization program which includes automated exposure control, adjustment of the mA and/or kV according to patient size and/or use of iterative reconstruction technique. CONTRAST:  OMNIPAQUE IOHEXOL 350 MG/ML SOLN COMPARISON:  None Available. FINDINGS: CTA CHEST FINDINGS Cardiovascular: The thoracic aorta is normal in course and caliber. No intramural  hematoma, dissection, or aneurysm. Mild atherosclerotic calcification. Arch vasculature demonstrates classic anatomic configuration and is widely patent proximally. Mild aortic valvular calcification. No significant coronary artery calcification. Global cardiac size is within normal limits. No pericardial effusion. There is adequate opacification of the pulmonary arterial tree no intraluminal filling defect is identified through the segmental level to suggest acute pulmonary embolism. Central pulmonary arteries are of normal caliber. Mediastinum/Nodes: No enlarged mediastinal, hilar, or axillary lymph nodes. Thyroid gland, trachea, and esophagus demonstrate no significant findings. Lungs/Pleura: 10 mm sub solid pulmonary nodule is seen within the right lower lobe, axial image # 72/6, stable since prior examination. 5 mm noncalcified subpleural pulmonary nodule within the right lower lobe, axial image # 94/6, stable since prior examination. Lungs are otherwise clear. No confluent pulmonary infiltrate. No pneumothorax or pleural effusion. Central airways are widely patent. Musculoskeletal: Osseous structures are age-appropriate. No acute bone abnormality. Review of the MIP images confirms the above findings. CTA ABDOMEN AND PELVIS FINDINGS VASCULAR Aorta: Normal caliber aorta without aneurysm, dissection, vasculitis or significant stenosis. Moderate atherosclerotic calcification Celiac: Patent without evidence of aneurysm, dissection, vasculitis or significant stenosis. SMA: Patent without evidence of aneurysm, dissection, vasculitis or significant stenosis. Renals: Both renal arteries are patent without evidence of aneurysm, dissection, vasculitis, fibromuscular dysplasia or significant stenosis. IMA: Patent without evidence of aneurysm, dissection, vasculitis or significant stenosis. Inflow: Patent without evidence of aneurysm, dissection, vasculitis or significant stenosis. Veins: No obvious venous abnormality  within the limitations of this arterial phase study. Review of the MIP images confirms the above findings. NON-VASCULAR Hepatobiliary: Internal biliary stent is again seen extending from the mid common duct to the third portion of the duodenum. However, since the prior examination, there has developed progressive gallbladder and biliary distension and near complete resolution of pneumobilia suggesting occlusion of the stent. No enhancing intrahepatic mass. The gallbladder is otherwise unremarkable. Pancreas: The main pancreatic duct is markedly dilated throughout its course and there is marked atrophy of the pancreas, similar to prior MRI examination of 09/01/2022 with a poorly defined mass within the pancreatic head better demonstrated on prior  examination. Spleen: Unremarkable Adrenals/Urinary Tract: The adrenal glands are unremarkable. The kidneys are normal in size and position. Hemorrhagic cyst noted within the posterior interpolar region of the left kidney better demonstrated on prior MRI examination 09/01/2022. The kidneys are otherwise unremarkable. Bladder unremarkable. Stomach/Bowel: Mild-to-moderate distal colonic diverticulosis, most severe within the sigmoid colon. The stomach, small bowel, and large bowel are otherwise unremarkable. No evidence of obstruction or focal inflammation. Appendix normal. No free intraperitoneal gas or fluid. Lymphatic: No pathologic adenopathy within the abdomen and pelvis. Shotty peripancreatic adenopathy with adjacent to the head of the pancreas within the aortocaval region is nonspecific,. Reproductive: Prostate is unremarkable. Other: Small fat containing right inguinal hernia Musculoskeletal: Degenerative changes seen within the lumbar spine. No acute bone abnormality. No lytic or blastic bone lesion. Review of the MIP images confirms the above findings. IMPRESSION: 1. No evidence of thoracoabdominal aortic aneurysm or dissection. 2. No pulmonary embolism. 3.  Progressive gallbladder and biliary distension and near complete resolution of pneumobilia suggesting occlusion of the internal biliary stent. Correlation with liver enzymes and possible hepatobiliary scintigraphy would be helpful to confirm an obstructive process. 4. Poorly defined mass within the pancreatic head better demonstrated on prior MRI examination of 09/01/2022 with associated marked dilation of the main pancreatic duct and marked atrophy of the pancreas, similar to prior examination. 5. Stable right lower lobe pulmonary nodules, indeterminate. Close attention on subsequent surveillance/restaging examination is warranted. If none are planned, a follow-up evaluation in 6 months is recommended for further evaluation. 6. Mild-to-moderate distal colonic diverticulosis without superimposed acute inflammatory change. Aortic Atherosclerosis (ICD10-I70.0). Electronically Signed   By: Helyn Numbers M.D.   On: 10/13/2022 02:08      Impression /Plan:   82 year old male patient with medical history significant for pancreatic head adenocarcinoma being admitted to the hospital with epigastric pain and concern for biliary stent occlusion.  Epigastric pain from biliary occlusion secondary to pancreatic head adenocarcinoma. Leukocytosis, afebrile, dynamically stable.  WBC 20, up from 8.2 on 10/7 -Trend CBC, CMP, LFT's -NPO - Scheduled ERCP with stent exchange at [facility] today. The benefits and risks of ERCP with possible sphincterotomy not limited to cardiopulmonary complications of sedation, bleeding, infection, perforation,and pancreatitis were discussed with the patient who agrees to proceed.   -Hold am lovenox -Continue Pantoprazole 40mg   -Pain and nausea medication prn -On IV Zosyn -Check INR/ PT now  Elevated LFTs secondary to obstructed biliary stent.  AST/ALT 100, total bili 4.6.  Lipase 18. -Trend LFTs  Pancreatic adenocarcinoma -following with Dr. Myna Hidalgo -scheduled with second opinion  with [facility] next week  Type 2 DM -SSI  Principal Problem:   Pancreatic cancer (HCC) Active Problems:   Obstructive jaundice    LOS: 0 days    Thank you for your kind consultation, we will continue to follow.   Deanna J May  10/13/2022, 9:01 AM    Attending Physician Note   I have taken a history, reviewed the chart and examined the patient. I performed a substantive portion of this encounter, including complete performance of at least one of the key components, in conjunction with the APP. I agree with the APP's note, impression and recommendations with my edits. My additional impressions and recommendations are as follows.   Pancreatic head adenocarcinoma, T2 N0 M0, with a biliary stent placed in August presents with signs and symptoms of biliary obstruction suspected biliary stent obstruction.  ERCP with stent exchange planned.  IV Zosyn for now.  Ongoing follow-up with oncology as  planned.  Claudette Head, MD [facility] See AM[facility], [facility], for our on call provider

## 2022-10-14 DIAGNOSIS — K831 Obstruction of bile duct: Secondary | ICD-10-CM

## 2022-10-14 DIAGNOSIS — C250 Malignant neoplasm of head of pancreas: Secondary | ICD-10-CM | POA: Diagnosis not present

## 2022-10-14 LAB — COMPREHENSIVE METABOLIC PANEL
ALT: 93 U/L — ABNORMAL HIGH (ref 0–44)
AST: 64 U/L — ABNORMAL HIGH (ref 15–41)
Albumin: 2.6 g/dL — ABNORMAL LOW (ref 3.5–5.0)
Alkaline Phosphatase: 334 U/L — ABNORMAL HIGH (ref 38–126)
Anion gap: 7 (ref 5–15)
BUN: 21 mg/dL (ref 8–23)
CO2: 26 mmol/L (ref 22–32)
Calcium: 8.9 mg/dL (ref 8.9–10.3)
Chloride: 102 mmol/L (ref 98–111)
Creatinine, Ser: 1.16 mg/dL (ref 0.61–1.24)
GFR, Estimated: >60 mL/min (ref >60)
Glucose, Bld: 178 mg/dL — ABNORMAL HIGH (ref 70–99)
Potassium: 4.1 mmol/L (ref 3.5–5.1)
Sodium: 135 mmol/L (ref 135–145)
Total Bilirubin: 4 mg/dL — ABNORMAL HIGH (ref 0.3–1.2)
Total Protein: 5.8 g/dL — ABNORMAL LOW (ref 6.5–8.1)

## 2022-10-14 LAB — CBC
HCT: 35.9 % — ABNORMAL LOW (ref 39.0–52.0)
Hemoglobin: 12.2 g/dL — ABNORMAL LOW (ref 13.0–17.0)
MCH: 33 pg (ref 26.0–34.0)
MCHC: 34 g/dL (ref 30.0–36.0)
MCV: 97 fL (ref 80.0–100.0)
Platelets: 218 10*3/uL (ref 150–400)
RBC: 3.7 MIL/uL — ABNORMAL LOW (ref 4.22–5.81)
RDW: 13.9 % (ref 11.5–15.5)
WBC: 17.7 10*3/uL — ABNORMAL HIGH (ref 4.0–10.5)
nRBC: 0 % (ref 0.0–0.2)

## 2022-10-14 LAB — GLUCOSE, CAPILLARY: Glucose-Capillary: 231 mg/dL — ABNORMAL HIGH (ref 70–99)

## 2022-10-14 MED ORDER — AMOXICILLIN-POT CLAVULANATE 875-125 MG PO TABS
1.0000 | ORAL_TABLET | Freq: Two times a day (BID) | ORAL | 0 refills | Status: AC
Start: 2022-10-14 — End: 2022-10-19

## 2022-10-14 MED ORDER — PANCRELIPASE (LIP-PROT-AMYL) 12000-38000 UNITS PO CPEP
72000.0000 [IU] | ORAL_CAPSULE | Freq: Three times a day (TID) | ORAL | Status: DC
  Administered 2022-10-14: 72000 [IU] via ORAL
  Filled 2022-10-14: qty 6

## 2022-10-14 NOTE — Discharge Summary (Signed)
Physician Discharge Summary  Patient ID: Alexander [name] MRN: [id number] DOB/AGE: 01/31/1940 82 y.o.  Admit date: 10/12/2022 Discharge date: 10/14/2022  Admission Diagnoses:  Discharge Diagnoses:  Principal Problem:   Pancreatic cancer [facility]) Active Problems:   Obstructive jaundice   Cholangitis.   Biliary stricture.   Discharged Condition: stable  Hospital Course: Patient is an 82 year old male with past medical history significant for coronary artery disease, GERD, hypertension, diabetes mellitus and I did not see the pancreatic head.  Patient was admitted with abdominal pain there was concerning for biliary stent occlusion.  Patient underwent ERCP (see documentation below).  ERCP revealed severe biliary stricture found in the lower third of the main bile duct.  The stricture was malignant appearing.  The common bile duct proximal to the stricture was markedly dilated.  The biliary stent was exchanged in the common bile duct with partially covered SEMS placed.  GI team directed patient's management.  GI team is cleared patient for discharge.  Patient will be discharged on Augmentin 875 Mg p.o. twice daily for 5 days.  Cholangitis was also found.  Patient is eager to be discharged back home.  Elevated LFTs: Pancreatic adeno CA: Leukocytosis: Type 2 diabetes mellitus:  Consults: GI  Significant Diagnostic Studies:  ERCP revealed: -Prior sphincterotomy -A single severe biliary stricture was found in the lower third of the main bile duct. -The stricture was malignant appearing. - The common bile duct proximal to the stricture was markedly dilated with a mass, stricture causing an obstruction - One stent was exchanged in the common bile duct with a partially covered SEMS placed. - Cholangitis.  Discharge Exam: Blood pressure 115/71, pulse (!) 53, temperature 97.6 F (36.4 C), temperature source Oral, resp. rate 16, height 5\' 11"  (1.803 m), weight 59.9 kg, SpO2  100%.   Disposition: Discharge disposition: 01-Home or Self Care       Discharge Instructions     Diet - low sodium heart healthy   Complete by: As directed    Increase activity slowly   Complete by: As directed       Allergies as of 10/14/2022       Reactions   Metformin And Related Diarrhea   Metformin Hcl Diarrhea        Medication List     TAKE these medications    Accu-Chek Aviva Plus w/Device Kit Use to test blood glucose once daily   Accu-Chek Softclix Lancets lancets Use to test blood glucose once daily   amLODipine 10 MG tablet Commonly known as: NORVASC TAKE ONE TABLET BY MOUTH DAILY   amoxicillin-clavulanate 875-125 MG tablet Commonly known as: AUGMENTIN Take 1 tablet by mouth 2 (two) times daily for 5 days.   Embrace Pro Glucose Control Liqd Use to check blood glucose TID   Embrace Pro Glucose Test test strip Generic drug: glucose blood Use as instructed   FreeStyle Libre 3 Sensor Misc 1 Device by Does not apply route every 14 (fourteen) days. Place 1 sensor on the skin every 14 days. Use to check glucose continuously   glipiZIDE 10 MG 24 hr tablet Commonly known as: GLUCOTROL XL Take 10 mg by mouth in the morning and at bedtime.   insulin glargine 100 UNIT/ML injection Commonly known as: Lantus Inject 0.1 mLs (10 Units total) into the skin daily.   Insulin Pen Needle 29G X Misc Use daily for insulin pen   lipase/protease/amylase 16010 UNITS Cpep capsule Commonly known as: Creon Take 2 capsules (72,000 Units total)  by mouth 3 (three) times daily with meals. May also take 1 capsule (36,000 Units total) as needed (with snacks - up to 4 snacks daily).   pantoprazole 40 MG tablet Commonly known as: PROTONIX Take 1 tablet (40 mg total) by mouth 2 (two) times daily before a meal.       Time spent: 35 minutes.  SignedBarnetta Chapel 10/14/2022, 12:29 PM

## 2022-10-14 NOTE — Plan of Care (Signed)

## 2022-10-14 NOTE — Progress Notes (Signed)
He feels well this morning.  He felt well yesterday.  He did have a stent exchanged.  As always, Dr. Russella Dar did a great job.  There are no labs back yet.  He wants to eat.  I do not see any reason why cannot eat.  There is no nausea or vomiting.  He has had no fever.  I wonder if the stent material was cultured.  From the report, it looks like there was purulent material that was coming from the biliary duct.  He is on Zosyn.  He has had no chills.  There has been no bleeding.  He does have diabetes.  By his home monitor, his blood sugar is 1 of 176.  His vital signs are all stable.  Temperature 97.6.  Pulse 53.  Blood pressure 115/71.  His head neck exam shows some scleral icterus.  He has no adenopathy.  Lungs are clear.  Cardiac exam regular rate and rhythm.  Abdomen is soft.  He has decent bowel sounds.  There is no fluid wave.  There is no abdominal mass.  He has no palpable liver or spleen tip.  Extremity shows no clubbing, cyanosis or edema.  Neurological exam is nonfocal.  We will have to see what the liver studies look like.  I would like to think that they will be better.  Again, it looks like from the operative report, there was purulent material that was seen in the biliary system.  I wonder if this was cultured.  I know that he is getting great care from everybody upon 6 E.  I do appreciate their compassion.   Christin Bach, MD  Duwayne Heck 41:10

## 2022-10-14 NOTE — Progress Notes (Addendum)
Progress Note   Assessment    Biliary obstruction due to stent occlusion, pancreatic cancer. ERCP with covered SEMS placed yesterday, LFTs have decreased  Mild cholangitis. WBC has decreased to 17.7. Previous biliary stent not cultured as it was lodged in the duodenoscope and was not retrieved Pancreatic adenocarcinoma  Principal Problem:   Pancreatic cancer (HCC) Active Problems:   Obstructive jaundice   Recommendations   Advance to regular diet Resume Creon 36,000U 2 before meals, 1 before snacks Augmentin for 5 days at discharge Follow up with Dr. Myna Hidalgo as planned OK for discharge today from GI standpoint. GI follow prn     Chief Complaint   Feels well. Wants to eat.   Vital signs in last 24 hours: Temp:  [97.6 F (36.4 C)-98.7 F (37.1 C)] 97.6 F (36.4 C) (10/12 0509) Pulse Rate:  [53-73] 53 (10/12 0509) Resp:  [11-20] 16 (10/12 0509) BP: (115-130)/(69-72) 115/71 (10/12 0509) SpO2:  [97 %-100 %] 100 % (10/12 0509) Weight:  [59.9 kg] 59.9 kg (10/11 1348) Last BM Date : 10/13/22  General: Alert, well-developed, in NAD Heart:  Regular rate and rhythm; no murmurs Chest: Clear to ascultation bilaterally Abdomen:  Soft, nontender and nondistended. Normal bowel sounds, without guarding, and without rebound.   Extremities:  Without edema. Neurologic:  Alert and  oriented x4; grossly normal neurologically. Psych:  Alert and cooperative. Normal mood and affect.  Intake/Output from previous day: 10/11 0701 - 10/12 0700 In: 100 [IV Piggyback:100] Out: -  Intake/Output this shift: No intake/output data recorded.  Lab Results: Recent Labs    10/12/22 2344 10/14/22 0917  WBC 20.0* 17.7*  HGB 13.4 12.2*  HCT 39.3 35.9*  PLT 243 218   BMET Recent Labs    10/12/22 2344  NA 134*  K 3.5  CL 102  CO2 24  GLUCOSE 234*  BUN 23  CREATININE 1.02  CALCIUM 8.7*   LFT Recent Labs    10/12/22 2344  PROT 6.6  ALBUMIN 3.4*  AST 100*  ALT 100*   ALKPHOS 457*  BILITOT 4.6*   PT/INR Recent Labs    10/13/22 1855  LABPROT 16.1*  INR 1.3*     Studies/Results: DG ERCP  Result Date: 10/13/2022 CLINICAL DATA:  ERCP.  History of pancreatic cancer EXAM: ERCP TECHNIQUE: Multiple spot images obtained with the fluoroscopic device and submitted for interpretation post-procedure. FLUOROSCOPY TIME: FLUOROSCOPY TIME 2 minutes, 39 seconds (32.8 mGy) COMPARISON:  ERCP-09/02/2022 FINDINGS: Ten spot fluoroscopic images of the right upper abdominal quadrant during ERCP are provided for review Initial image demonstrates an ERCP probe and pre-existing plastic biliary stent overlying the right upper abdominal quadrant. Subsequent images demonstrate removal of the pre-existing plastic stent with selective cannulation and opacification of the common bile duct which appears markedly dilated with malignant shouldering/subtotal occlusion at its distal aspect. Completion image demonstrates placement of a metallic biliary stent traversing the distal aspect of the CBD. IMPRESSION: ERCP with biliary stent exchange as above. These images were submitted for radiologic interpretation only. Please see the procedural report for the amount of contrast and the fluoroscopy time utilized. Electronically Signed   By: Simonne Come M.D.   On: 10/13/2022 17:15   CT Angio Chest/Abd/Pel for Dissection W and/or Wo Contrast  Result Date: 10/13/2022 CLINICAL DATA:  Acute aortic syndrome, upper abdominal pain, upper back pain. Pancreatic cancer, leukocytosis. EXAM: CT ANGIOGRAPHY CHEST, ABDOMEN AND PELVIS TECHNIQUE: Non-contrast CT of the chest was initially obtained. Multidetector CT imaging through the chest,  abdomen and pelvis was performed using the standard protocol during bolus administration of intravenous contrast. Multiplanar reconstructed images and MIPs were obtained and reviewed to evaluate the vascular anatomy. RADIATION DOSE REDUCTION: This exam was performed according to  the departmental dose-optimization program which includes automated exposure control, adjustment of the mA and/or kV according to patient size and/or use of iterative reconstruction technique. CONTRAST:  OMNIPAQUE IOHEXOL 350 MG/ML SOLN COMPARISON:  None Available. FINDINGS: CTA CHEST FINDINGS Cardiovascular: The thoracic aorta is normal in course and caliber. No intramural hematoma, dissection, or aneurysm. Mild atherosclerotic calcification. Arch vasculature demonstrates classic anatomic configuration and is widely patent proximally. Mild aortic valvular calcification. No significant coronary artery calcification. Global cardiac size is within normal limits. No pericardial effusion. There is adequate opacification of the pulmonary arterial tree no intraluminal filling defect is identified through the segmental level to suggest acute pulmonary embolism. Central pulmonary arteries are of normal caliber. Mediastinum/Nodes: No enlarged mediastinal, hilar, or axillary lymph nodes. Thyroid gland, trachea, and esophagus demonstrate no significant findings. Lungs/Pleura: 10 mm sub solid pulmonary nodule is seen within the right lower lobe, axial image # 72/6, stable since prior examination. 5 mm noncalcified subpleural pulmonary nodule within the right lower lobe, axial image # 94/6, stable since prior examination. Lungs are otherwise clear. No confluent pulmonary infiltrate. No pneumothorax or pleural effusion. Central airways are widely patent. Musculoskeletal: Osseous structures are age-appropriate. No acute bone abnormality. Review of the MIP images confirms the above findings. CTA ABDOMEN AND PELVIS FINDINGS VASCULAR Aorta: Normal caliber aorta without aneurysm, dissection, vasculitis or significant stenosis. Moderate atherosclerotic calcification Celiac: Patent without evidence of aneurysm, dissection, vasculitis or significant stenosis. SMA: Patent without evidence of aneurysm, dissection, vasculitis or  significant stenosis. Renals: Both renal arteries are patent without evidence of aneurysm, dissection, vasculitis, fibromuscular dysplasia or significant stenosis. IMA: Patent without evidence of aneurysm, dissection, vasculitis or significant stenosis. Inflow: Patent without evidence of aneurysm, dissection, vasculitis or significant stenosis. Veins: No obvious venous abnormality within the limitations of this arterial phase study. Review of the MIP images confirms the above findings. NON-VASCULAR Hepatobiliary: Internal biliary stent is again seen extending from the mid common duct to the third portion of the duodenum. However, since the prior examination, there has developed progressive gallbladder and biliary distension and near complete resolution of pneumobilia suggesting occlusion of the stent. No enhancing intrahepatic mass. The gallbladder is otherwise unremarkable. Pancreas: The main pancreatic duct is markedly dilated throughout its course and there is marked atrophy of the pancreas, similar to prior MRI examination of 09/01/2022 with a poorly defined mass within the pancreatic head better demonstrated on prior examination. Spleen: Unremarkable Adrenals/Urinary Tract: The adrenal glands are unremarkable. The kidneys are normal in size and position. Hemorrhagic cyst noted within the posterior interpolar region of the left kidney better demonstrated on prior MRI examination 09/01/2022. The kidneys are otherwise unremarkable. Bladder unremarkable. Stomach/Bowel: Mild-to-moderate distal colonic diverticulosis, most severe within the sigmoid colon. The stomach, small bowel, and large bowel are otherwise unremarkable. No evidence of obstruction or focal inflammation. Appendix normal. No free intraperitoneal gas or fluid. Lymphatic: No pathologic adenopathy within the abdomen and pelvis. Shotty peripancreatic adenopathy with adjacent to the head of the pancreas within the aortocaval region is nonspecific,.  Reproductive: Prostate is unremarkable. Other: Small fat containing right inguinal hernia Musculoskeletal: Degenerative changes seen within the lumbar spine. No acute bone abnormality. No lytic or blastic bone lesion. Review of the MIP images confirms the above findings. IMPRESSION: 1. No  evidence of thoracoabdominal aortic aneurysm or dissection. 2. No pulmonary embolism. 3. Progressive gallbladder and biliary distension and near complete resolution of pneumobilia suggesting occlusion of the internal biliary stent. Correlation with liver enzymes and possible hepatobiliary scintigraphy would be helpful to confirm an obstructive process. 4. Poorly defined mass within the pancreatic head better demonstrated on prior MRI examination of 09/01/2022 with associated marked dilation of the main pancreatic duct and marked atrophy of the pancreas, similar to prior examination. 5. Stable right lower lobe pulmonary nodules, indeterminate. Close attention on subsequent surveillance/restaging examination is warranted. If none are planned, a follow-up evaluation in 6 months is recommended for further evaluation. 6. Mild-to-moderate distal colonic diverticulosis without superimposed acute inflammatory change. Aortic Atherosclerosis (ICD10-I70.0). Electronically Signed   By: Helyn Numbers M.D.   On: 10/13/2022 02:08      LOS: 1 day   Jaloni Sorber T. Russella Dar, MD 10/14/2022, 9:26 AM See Loretha Stapler, [facility], to contact our on call provider

## 2022-10-14 NOTE — Progress Notes (Signed)
Pt discharged home with spouse in stable condition. Discharge instructions given. PT verbalized understanding. No immediate question or concerns.  Pt chose to ambulate off the department.

## 2022-10-14 NOTE — Anesthesia Postprocedure Evaluation (Signed)
Anesthesia Post Note  Patient: Alexander [name]  Procedure(s) Performed: ENDOSCOPIC RETROGRADE CHOLANGIOPANCREATOGRAPHY (ERCP) WITH PROPOFOL STENT REMOVAL BILIARY STENT PLACEMENT     Patient location during evaluation: PACU Anesthesia Type: General Level of consciousness: awake and alert Pain management: pain level controlled Vital Signs Assessment: post-procedure vital signs reviewed and stable Respiratory status: spontaneous breathing, nonlabored ventilation and respiratory function stable Cardiovascular status: blood pressure returned to baseline Postop Assessment: no apparent nausea or vomiting Anesthetic complications: no   No notable events documented.        Shanda Howells

## 2022-10-15 ENCOUNTER — Encounter (HOSPITAL_COMMUNITY): Payer: Self-pay | Admitting: Gastroenterology

## 2022-10-16 ENCOUNTER — Telehealth: Payer: Self-pay

## 2022-10-16 NOTE — Transitions of Care (Post Inpatient/ED Visit) (Signed)
10/16/2022  Name: Alexander [name] MRN: [id number] DOB: Jun 04, 1940  Today's TOC FU Call Status: Today's TOC FU Call Status:: Successful TOC FU Call Completed TOC FU Call Complete Date: 10/16/22 Patient's Name and Date of Birth confirmed.  Transition Care Management Follow-up Telephone Call Date of Discharge: 10/14/22 Discharge Facility: [facility]) Type of Discharge: Inpatient Admission Primary Inpatient Discharge Diagnosis:: pancreatic Cancer  s/p ERCP How have you been since you were released from the hospital?: Better (trouble sleeping due to hyperglycemia ranging 200-350) Any questions or concerns?: Yes Patient Questions/Concerns:: changed Diabetic meds , started on 10 units Lantus Patient Questions/Concerns Addressed: Other: (pt has contacted PCP and has appt 10/15)  Items Reviewed: Did you receive and understand the discharge instructions provided?: Yes Medications obtained,verified, and reconciled?: Yes (Medications Reviewed) Any new allergies since your discharge?: No Dietary orders reviewed?: Yes Type of Diet Ordered:: Per patient he can eat whatever he wants does not adhere to [facility] and has no restictions Do you have support at home?: Yes People in Home: spouse, grandchild(ren), child(ren), dependent Name of Support/Comfort Primary Source: Vicki-spouse Dtr Dr Vickii Chafe and Gr Dtr  Medications Reviewed Today: Medications Reviewed Today     Reviewed by Johnnette Barrios, RN (Registered Nurse) on 10/16/22 at 1619  Med List Status: <None>   Medication Order Taking? Sig Documenting Provider Last Dose Status Informant  Accu-Chek Softclix Lancets lancets [id number]  Use to test blood glucose once daily Nafziger, Kandee Keen, NP  Active Self  amLODipine (NORVASC) 10 MG tablet [id number] Yes TAKE ONE TABLET BY MOUTH DAILY Nafziger, Kandee Keen, NP Taking Active Self  amoxicillin-clavulanate (AUGMENTIN) 875-125 MG tablet [id number] Yes Take 1 tablet by mouth 2 (two) times daily for  5 days. Barnetta Chapel, MD Taking Active   Blood Glucose Calibration (EMBRACE PRO GLUCOSE CONTROL) LIQD [id number] No Use to check blood glucose TID  Patient not taking: Reported on 10/09/2022   Shirline Frees, NP Not Taking Active Self  Blood Glucose Monitoring Suppl (ACCU-CHEK AVIVA PLUS) w/Device KIT [id number] No Use to test blood glucose once daily  Patient not taking: Reported on 10/09/2022   Shirline Frees, NP Not Taking Active Self  Continuous Glucose Sensor (FREESTYLE LIBRE 3 SENSOR) [place] [id number] Yes 1 Device by Does not apply route every 14 (fourteen) days. Place 1 sensor on the skin every 14 days. Use to check glucose continuously Nafziger, Kandee Keen, NP Taking Active Self  glipiZIDE (GLUCOTROL XL) 10 MG 24 hr tablet [id number] No Take 10 mg by mouth in the morning and at bedtime.  Patient not taking: Reported on 10/16/2022   [provider] Not Taking Active Self  glucose blood (EMBRACE PRO GLUCOSE TEST) test strip [id number] No Use as instructed  Patient not taking: Reported on 10/16/2022   Shirline Frees, NP Not Taking Active Self  insulin glargine (LANTUS) 100 UNIT/ML injection [id number] Yes Inject 0.1 mLs (10 Units total) into the skin daily. Shirline Frees, NP Taking Active Self           Med Note Cyndie Chime, [place]   Fri Oct 13, 2022  5:03 AM) Hasn't started  Insulin Pen Needle 29G X MISC [id number] Yes Use daily for insulin pen Nafziger, Kandee Keen, NP Taking Active Self  lipase/protease/amylase (CREON) 36000 UNITS CPEP capsule [id number] Yes Take 2 capsules (72,000 Units total) by mouth 3 (three) times daily with meals. May also take 1 capsule (36,000 Units total) as needed (with snacks - up to 4 snacks daily). Josph Macho, MD Taking  Active Self  pantoprazole (PROTONIX) 40 MG tablet [id number] Yes Take 1 tablet (40 mg total) by mouth 2 (two) times daily before a meal. Mansouraty, Netty Starring., MD Taking Active Self            Home Care and  Equipment/Supplies: Were Home Health Services Ordered?: No Any new equipment or medical supplies ordered?: No  Functional Questionnaire: Do you need assistance with bathing/showering or dressing?: No Do you need assistance with meal preparation?: No Do you need assistance with eating?: No Do you have difficulty maintaining continence: No (increased frequency due to hyperglycemia) Do you need assistance with getting out of bed/getting out of a chair/moving?: No (He is about to complete a 2-3 mile walk) Do you have difficulty managing or taking your medications?: No (He has concerns r/t Diabetic medication changes and 10 U Lantus is not controlling BG)  Follow up appointments reviewed: PCP Follow-up appointment confirmed?: Yes Date of PCP follow-up appointment?: 10/17/22 Follow-up Provider: Shirline Frees, NP Specialist Hospital Follow-up appointment confirmed?: Yes Date of Specialist follow-up appointment?: 10/18/22 (Has Oncology referral for Chemo treatmentd and availability of [place] at his desired location) Follow-Up Specialty Provider:: Oncology - new referral He is unsure of MD name Do you need transportation to your follow-up appointment?: No Do you understand care options if your condition(s) worsen?: Yes-patient verbalized understanding    Goals Addressed             This Visit's Progress    TOC Care Plan       Current Barriers:  Care Coordination s/p hospital discharge for on-going medical conditions and s/s management    RNCM Clinical Goal(s):  Patient will work with the Care Management team over the next 30 days to address Transition of Care Barriers: Medication Management Provider appointments take all medications exactly as prescribed and will call provider for medication related questions as evidenced by and change in s/s BG not controlled with medication or interventions  attend all scheduled medical appointments: with PCP and oncologist  as evidenced by no missed appt  no unplanned ER visits  demonstrate ongoing self health care management ability notifying PCP and Specialists of ant change in status  as evidenced by no unmanaged or adverse side effects and acute changes will be managed   through collaboration with RN Care manager, provider, and care team.   Interventions: Evaluation of current treatment plan related to  self management and patient's adherence to plan as established by provider  Transitions of Care:  New goal. Doctor Visits  - discussed the importance of doctor visits Contacted provider for patient needs patient LM for PCP re hyperglycemic episodes and has appt 10/17/22  Patient Goals/Self-Care Activities: Participate in Transition of Care Program/Attend TOC scheduled calls Take all medications as prescribed Attend all scheduled provider appointments Call pharmacy for medication refills 3-7 days in advance of running out of medications Perform all self care activities independently  Perform IADL's (shopping, preparing meals, housekeeping, managing finances) independently Call provider office for new concerns or questions   Follow Up Plan:  Telephone follow up appointment with care management team member scheduled for:  1022/24 @ 11:30am  The patient has been provided with contact information for the care management team and has been advised to call with any health related questions or concerns.          Patient educated on red flags s/s to watch for and was encouraged to report any of these identified , any new symptoms , changes in  baseline or  medication regimen,  change in health status  /  well-being, or safety concerns to PCP and / or the  [facility] Case Management team . Reviewed goals for care Patient provided with Contact information and verbalized understanding with current POC.    Susa Loffler , BSN, RN Care Management Coordinator [place]   [facility] [email or web address] Direct Dial: [phone]

## 2022-10-17 ENCOUNTER — Ambulatory Visit: Payer: Medicare HMO | Admitting: Adult Health

## 2022-10-19 ENCOUNTER — Other Ambulatory Visit: Payer: Self-pay | Admitting: Adult Health

## 2022-10-19 ENCOUNTER — Encounter: Payer: Self-pay | Admitting: Adult Health

## 2022-10-19 ENCOUNTER — Encounter: Payer: Self-pay | Admitting: Hematology & Oncology

## 2022-10-19 DIAGNOSIS — E1169 Type 2 diabetes mellitus with other specified complication: Secondary | ICD-10-CM

## 2022-10-19 DIAGNOSIS — K8689 Other specified diseases of pancreas: Secondary | ICD-10-CM

## 2022-10-19 MED ORDER — PANCRELIPASE (LIP-PROT-AMYL) 36000-114000 UNITS PO CPEP: ORAL_CAPSULE | ORAL | 5 refills | Status: DC

## 2022-10-19 NOTE — Telephone Encounter (Signed)
Pt advised that Nurtec samples are placed up front.

## 2022-10-19 NOTE — Telephone Encounter (Signed)
FYI

## 2022-10-20 ENCOUNTER — Other Ambulatory Visit: Payer: Self-pay

## 2022-10-20 DIAGNOSIS — K8689 Other specified diseases of pancreas: Secondary | ICD-10-CM

## 2022-10-20 NOTE — Progress Notes (Signed)
re

## 2022-10-23 ENCOUNTER — Inpatient Hospital Stay: Payer: Medicare HMO

## 2022-10-23 ENCOUNTER — Other Ambulatory Visit: Payer: Medicare HMO

## 2022-10-23 ENCOUNTER — Encounter: Payer: Self-pay | Admitting: Hematology & Oncology

## 2022-10-23 ENCOUNTER — Inpatient Hospital Stay (HOSPITAL_BASED_OUTPATIENT_CLINIC_OR_DEPARTMENT_OTHER): Payer: Medicare HMO | Admitting: Hematology & Oncology

## 2022-10-23 ENCOUNTER — Encounter: Payer: Self-pay | Admitting: *Deleted

## 2022-10-23 ENCOUNTER — Inpatient Hospital Stay: Payer: Medicare HMO | Admitting: Hematology & Oncology

## 2022-10-23 VITALS — BP 124/60 | HR 63 | Temp 97.7°F | Resp 20 | Wt 138.2 lb

## 2022-10-23 DIAGNOSIS — Z79899 Other long term (current) drug therapy: Secondary | ICD-10-CM | POA: Diagnosis not present

## 2022-10-23 DIAGNOSIS — C252 Malignant neoplasm of tail of pancreas: Secondary | ICD-10-CM

## 2022-10-23 DIAGNOSIS — K8689 Other specified diseases of pancreas: Secondary | ICD-10-CM

## 2022-10-23 DIAGNOSIS — Z5111 Encounter for antineoplastic chemotherapy: Secondary | ICD-10-CM | POA: Diagnosis present

## 2022-10-23 DIAGNOSIS — R17 Unspecified jaundice: Secondary | ICD-10-CM | POA: Diagnosis not present

## 2022-10-23 DIAGNOSIS — D45 Polycythemia vera: Secondary | ICD-10-CM | POA: Diagnosis not present

## 2022-10-23 DIAGNOSIS — C259 Malignant neoplasm of pancreas, unspecified: Secondary | ICD-10-CM | POA: Diagnosis present

## 2022-10-23 DIAGNOSIS — E119 Type 2 diabetes mellitus without complications: Secondary | ICD-10-CM | POA: Diagnosis not present

## 2022-10-23 LAB — CBC WITH DIFFERENTIAL (CANCER CENTER ONLY)
Abs Immature Granulocytes: 0.16 10*3/uL — ABNORMAL HIGH (ref 0.00–0.07)
Basophils Absolute: 0.1 10*3/uL (ref 0.0–0.1)
Basophils Relative: 1 %
Eosinophils Absolute: 0.3 10*3/uL (ref 0.0–0.5)
Eosinophils Relative: 3 %
HCT: 42.1 % (ref 39.0–52.0)
Hemoglobin: 14.3 g/dL (ref 13.0–17.0)
Immature Granulocytes: 2 %
Lymphocytes Relative: 19 %
Lymphs Abs: 1.9 10*3/uL (ref 0.7–4.0)
MCH: 32.5 pg (ref 26.0–34.0)
MCHC: 34 g/dL (ref 30.0–36.0)
MCV: 95.7 fL (ref 80.0–100.0)
Monocytes Absolute: 1 10*3/uL (ref 0.1–1.0)
Monocytes Relative: 10 %
Neutro Abs: 6.5 10*3/uL (ref 1.7–7.7)
Neutrophils Relative %: 65 %
Platelet Count: 313 10*3/uL (ref 150–400)
RBC: 4.4 MIL/uL (ref 4.22–5.81)
RDW: 13.3 % (ref 11.5–15.5)
WBC Count: 9.9 10*3/uL (ref 4.0–10.5)
nRBC: 0 % (ref 0.0–0.2)

## 2022-10-23 LAB — CMP (CANCER CENTER ONLY)
ALT: 26 U/L (ref 0–44)
AST: 16 U/L (ref 15–41)
Albumin: 4 g/dL (ref 3.5–5.0)
Alkaline Phosphatase: 214 U/L — ABNORMAL HIGH (ref 38–126)
Anion gap: 8 (ref 5–15)
BUN: 25 mg/dL — ABNORMAL HIGH (ref 8–23)
CO2: 29 mmol/L (ref 22–32)
Calcium: 9.3 mg/dL (ref 8.9–10.3)
Chloride: 101 mmol/L (ref 98–111)
Creatinine: 1.25 mg/dL — ABNORMAL HIGH (ref 0.61–1.24)
GFR, Estimated: 57 mL/min — ABNORMAL LOW (ref >60)
Glucose, Bld: 245 mg/dL — ABNORMAL HIGH (ref 70–99)
Potassium: 4.4 mmol/L (ref 3.5–5.1)
Sodium: 138 mmol/L (ref 135–145)
Total Bilirubin: 1.5 mg/dL — ABNORMAL HIGH (ref 0.3–1.2)
Total Protein: 6.7 g/dL (ref 6.5–8.1)

## 2022-10-23 MED ORDER — INSULIN SYRINGE 30G X 5/16" 0.5 ML MISC: 5.0000 [IU] | Freq: Three times a day (TID) | 3 refills | Status: DC

## 2022-10-23 MED ORDER — OLANZAPINE 5 MG PO TABS: 5.0000 mg | ORAL_TABLET | Freq: Every day | ORAL | 4 refills | Status: DC

## 2022-10-23 MED ORDER — INSULIN ASPART 100 UNIT/ML IJ SOLN
10.0000 [IU] | Freq: Once | INTRAMUSCULAR | Status: AC
  Administered 2022-10-23: 10 [IU] via SUBCUTANEOUS
  Filled 2022-10-23: qty 0.1

## 2022-10-23 MED ORDER — INSULIN REGULAR HUMAN 100 UNIT/ML IJ SOLN: 5.0000 [IU] | Freq: Three times a day (TID) | INTRAMUSCULAR | 11 refills | Status: DC

## 2022-10-23 NOTE — Progress Notes (Signed)
Hematology and Oncology Follow Up Visit  CEDDRICK KRONICK [id number] 1940-08-01 82 y.o. 10/23/2022   Principle Diagnosis:  Adenocarcinoma of the pancreas-stage Ib (T2N0M0) --clinical stage --BRCA (-), KRAS (+), HRD(-)  Current Therapy:   Status post biliary stent placement FOLFIRINOX -- Neoadj - start cycle #1 on 10/31/2022     Interim History:  Mr. Entwisle is back for follow-up.  He has seen Surgical Oncology at [facility].  Also saw Medical Oncology at [facility].  They both feel that he needs neoadjuvant chemotherapy.  He is in good shape.  Has lost little bit of weight.  He is eating well.  His bilirubin has come back down.  He had another biliary stent placed.  This appears to be working.  His blood sugars are real problem.  He has diabetes now.  He is on long-acting insulin.  I sent in some short acting insulin that he takes with meals.  Hopefully this will help keep his blood sugars down a little bit.  I think that he would do okay on the FOLFIRINOX protocol.  I realize that he is quite mature.  However, he is in great shape.  I think with modification of the dose of FOLFIRINOX, he should get through.  We will do 4 cycles of treatment.  I think this would be adequate enough to see if this can cause some tumor shrinkage.  He has had no pain.  He has had no change in bowel or bladder habits.  He has had no cough or shortness of breath.  He has had no leg swelling.  He has had no pruritus.  There is been no bleeding.  His last CA 19-9 was 281.  Overall, I would say that his performance status is probably ECOG 1.    Medications:  Current Outpatient Medications:    Accu-Chek Softclix Lancets lancets, Use to test blood glucose once daily, Disp: 100 each, Rfl: 3   amLODipine (NORVASC) 10 MG tablet, TAKE ONE TABLET BY MOUTH DAILY, Disp: 90 tablet, Rfl: 3   Continuous Glucose Sensor (FREESTYLE LIBRE 3 SENSOR) MISC, 1 Device by Does not apply route every 14 (fourteen)  days. Place 1 sensor on the skin every 14 days. Use to check glucose continuously, Disp: 2 each, Rfl: 6   glucose blood (EMBRACE PRO GLUCOSE TEST) test strip, Use as instructed, Disp: 100 each, Rfl: 12   insulin glargine (LANTUS) 100 UNIT/ML injection, Inject 0.1 mLs (10 Units total) into the skin daily. (Patient taking differently: Inject 20 Units into the skin daily.), Disp: 10 mL, Rfl: 1   Insulin Pen Needle 29G X MISC, Use daily for insulin pen, Disp: 90 each, Rfl: 1   insulin regular (HUMULIN R) 100 units/mL injection, Inject 0.05 mLs (5 Units total) into the skin 3 (three) times daily before meals., Disp: 10 mL, Rfl: 11   Insulin Syringe-Needle U-100 (INSULIN SYRINGE .5CC/30GX5/16") 30G X 5/16" 0.5 ML MISC, Inject 5 Units into the skin 3 (three) times daily before meals., Disp: 100 each, Rfl: 3   lipase/protease/amylase (CREON) 36000 UNITS CPEP capsule, Take 2 capsules (72,000 Units total) by mouth 3 (three) times daily with meals. May also take 1 capsule (36,000 Units total) as needed (with snacks - up to 4 snacks daily)., Disp: 180 capsule, Rfl: 5   OLANZapine (ZYPREXA) 5 MG tablet, Take 1 tablet (5 mg total) by mouth at bedtime., Disp: 30 tablet, Rfl: 4   pantoprazole (PROTONIX) 40 MG tablet, Take 1 tablet (40 mg  total) by mouth 2 (two) times daily before a meal., Disp: 60 tablet, Rfl: 6   Blood Glucose Calibration (EMBRACE PRO GLUCOSE CONTROL) LIQD, Use to check blood glucose TID (Patient not taking: Reported on 10/23/2022), Disp: 1 each, Rfl:    Blood Glucose Monitoring Suppl (ACCU-CHEK AVIVA PLUS) w/Device KIT, Use to test blood glucose once daily (Patient not taking: Reported on 10/23/2022), Disp: 1 kit, Rfl: 0   glipiZIDE (GLUCOTROL XL) 10 MG 24 hr tablet, Take 10 mg by mouth in the morning and at bedtime. (Patient not taking: Reported on 10/16/2022), Disp: , Rfl:   Allergies:  Allergies  Allergen Reactions   Metformin And Related Diarrhea   Metformin Hcl Diarrhea    Past  Medical History, Surgical history, Social history, and Family History were reviewed and updated.  Review of Systems: Review of Systems  Constitutional:  Positive for unexpected weight change.  HENT:  Negative.    Eyes:  Positive for icterus.  Respiratory: Negative.    Cardiovascular: Negative.   Gastrointestinal: Negative.   Endocrine: Negative.   Genitourinary: Negative.    Musculoskeletal: Negative.   Skin:  Positive for rash.  Neurological: Negative.   Hematological: Negative.   Psychiatric/Behavioral: Negative.      Physical Exam:  weight is 138 lb 4 oz (62.7 kg). His oral temperature is 97.7 F (36.5 C). His blood pressure is 124/60 and his pulse is 63. His respiration is 20 and oxygen saturation is 99%.   Wt Readings from Last 3 Encounters:  10/23/22 138 lb 4 oz (62.7 kg)  10/13/22 132 lb (59.9 kg)  10/09/22 136 lb (61.7 kg)    Physical Exam Vitals reviewed.  HENT:     Head: Normocephalic and atraumatic.  Eyes:     Pupils: Pupils are equal, round, and reactive to light.     Comments: Ocular exam does show some slight scleral icterus.  Cardiovascular:     Rate and Rhythm: Normal rate and regular rhythm.     Heart sounds: Normal heart sounds.  Pulmonary:     Effort: Pulmonary effort is normal.     Breath sounds: Normal breath sounds.  Abdominal:     General: Bowel sounds are normal.     Palpations: Abdomen is soft.     Comments: Abdominal exam is soft.  He has good bowel sounds.  There is no fluid wave.  There is no obvious abdominal mass.  There is no palpable hepatosplenomegaly.  Musculoskeletal:        General: No tenderness or deformity. Normal range of motion.     Cervical back: Normal range of motion.  Lymphadenopathy:     Cervical: No cervical adenopathy.  Skin:    General: Skin is warm and dry.     Findings: No erythema or rash.     Comments: Skin exam does not show any obvious rashes.  He has some jaundice.  Neurological:     Mental Status: He is  alert and oriented to person, place, and time.  Psychiatric:        Behavior: Behavior normal.        Thought Content: Thought content normal.        Judgment: Judgment normal.      Lab Results  Component Value Date   WBC 9.9 10/23/2022   HGB 14.3 10/23/2022   HCT 42.1 10/23/2022   MCV 95.7 10/23/2022   PLT 313 10/23/2022     Chemistry      Component Value Date/Time  NA 138 10/23/2022 0753   NA 144 11/29/2016 1054   NA 139 12/29/2015 0954   K 4.4 10/23/2022 0753   K 4.2 11/29/2016 1054   K 4.1 12/29/2015 0954   CL 101 10/23/2022 0753   CL 102 11/29/2016 1054   CO2 29 10/23/2022 0753   CO2 30 11/29/2016 1054   CO2 26 12/29/2015 0954   BUN 25 (H) 10/23/2022 0753   BUN 22 11/29/2016 1054   BUN 20.0 12/29/2015 0954   CREATININE 1.25 (H) 10/23/2022 0753   CREATININE 1.2 11/29/2016 1054   CREATININE 1.3 12/29/2015 0954      Component Value Date/Time   CALCIUM 9.3 10/23/2022 0753   CALCIUM 9.3 11/29/2016 1054   CALCIUM 9.5 12/29/2015 0954   ALKPHOS 214 (H) 10/23/2022 0753   ALKPHOS 92 (H) 11/29/2016 1054   ALKPHOS 98 12/29/2015 0954   AST 16 10/23/2022 0753   AST 16 12/29/2015 0954   ALT 26 10/23/2022 0753   ALT 20 11/29/2016 1054   ALT 13 12/29/2015 0954   BILITOT 1.5 (H) 10/23/2022 0753   BILITOT 0.71 12/29/2015 0954      Impression and Plan: Mr. Patrizio is a very nice 82 year old white male.  Again, we have been seeing him for polycythemia vera.  Now, his problem is he has early stage pancreatic cancer.  He will need to have a Port-A-Cath placed.  I will go ahead and see about getting this set up.  Will try to get treatment going next week.  I gave him information about the protocol.  We will have to be very cautious with the blood counts.  We probably will have to have him come back in a week to have his blood counts checked just to make sure that they are not too low.  He has had no problems to date with the new stent.  His bilirubin has come down quite  nicely.  Again, I think 4 cycles of treatment in the neoadjuvant setting should be enough.  We will try to get started next week.  I will see him back myself when he starts cycle #2 of treatment.    We may have him come back just for labs a week after treatment.    Josph Macho, MD 10/21/20249:07 AM

## 2022-10-23 NOTE — Progress Notes (Signed)
Patient has decided to receive treatment at this location. Spoke to Dr Myna Hidalgo today and plan for FOLFIRINOX made. Plan to start next week. Will need port, and chemo education.   Port scheduled for 10/27/2022.  Chemo education scheduled for 10/24/2022 with patient. He is aware of date, time and location.   Social work and nutrition consult placed per protocol.   Oncology Nurse Navigator Documentation     10/23/2022   10:45 AM  Oncology Nurse Navigator Flowsheets  Abnormal Finding Date 08/31/2022  Confirmed Diagnosis Date 09/11/2022  Diagnosis Status Confirmed Diagnosis Complete  Planned Course of Treatment Neo Chemo  Phase of Treatment Chemo  Navigator Follow Up Date: 10/31/2022  Navigator Follow Up Reason: Chemotherapy  Navigator Location [facility]  Navigator Encounter Type Appt/Treatment Plan Review;Telephone  Patient Visit Type MedOnc  Treatment Phase Pre-Tx/Tx Discussion  Barriers/Navigation Needs Coordination of Care;Education  Education Other  Interventions Coordination of Care;Education;Referrals  Acuity Level 2-Minimal Needs (1-2 Barriers Identified)  Referrals Nutrition/dietician;Social Work  Coordination of Care Appts;Radiology  Education Method Verbal;Teach-back  Support Groups/Services Friends and Family  Time Spent with Patient 45

## 2022-10-23 NOTE — Patient Instructions (Signed)
Insulin Aspart Injection What is this medication? INSULIN ASPART (IN su lin AS part) treats diabetes. It works by increasing insulin levels in your body, which decreases your blood sugar (glucose). It belongs to a group of medications called rapid-acting insulins. Changes to diet and exercise are often combined with this medication. This medicine may be used for other purposes; ask your health care provider or pharmacist if you have questions. COMMON BRAND NAME(S): [place], [employer], [employer], [employer], NovoLog, NovoLog [place], NovoLog PenFill What should I tell my care team before I take this medication? They need to know if you have any of these conditions: Episodes of low blood sugar Eye disease, vision problems Kidney disease Liver disease An unusual or allergic reaction to insulin, metacresol, other medications, foods, dyes, or preservatives Pregnant or trying to get pregnant Breast-feeding How should I use this medication? This medication is injected under the skin. Use it exactly as directed. It is important to follow the directions given to you by your care team. If you are using Novolog, you should start your meal within 5 to 10 minutes after injection. If you are using Fiasp, you should start your meal at the time of injection or within 20 minutes after injection. Have food ready before injection. Do not delay eating. You will be taught how to use this medication and how to adjust doses for activities and illness. Do not use more insulin than prescribed. Do not use it more or less often than prescribed. Always check the appearance of your insulin before using it. This insulin should be clear and colorless like water. Do not use if it is cloudy, thickened, colored, or has solid particles in it. If you use a pen, be sure to take off the outer needle cover before using the dose. It is important that you put your used needles and syringes in a special sharps container. Do  not put them in a trash can. If you do not have a sharps container, call your pharmacist or care team to get one. This medication comes with INSTRUCTIONS FOR USE. Ask your pharmacist for directions on how to use this medication. Read the information carefully. Talk to your pharmacist or care team if you have questions. Talk to your care team about the use of this medication in children. While it may be prescribed for children as young as 2 years for selected conditions, precautions do apply. Overdosage: If you think you have taken too much of this medicine contact a poison control center or emergency room at once. NOTE: This medicine is only for you. Do not share this medicine with others. What if I miss a dose? It is important not to miss a dose. Your care team should discuss a plan for missed doses with you. If you do miss a dose, follow their plan. Do not take double doses. What may interact with this medication? Some medications may affect your blood sugar levels or hide the symptoms of low blood sugar (hypoglycemia). Talk with your care team about all of the medications you take. They may suggest changes to your insulin dose or checking your blood sugar levels more often. Medications that may affect your blood sugar levels include: Alcohol Certain antibiotics, such as ciprofloxacin, levofloxacin, sulfamethoxazole; trimethoprim Certain medications for blood pressure or heart disease, such as benazepril, enalapril, lisinopril, losartan, valsartan Certain medications for mental health conditions, such as fluoxetine or olanzapine Diuretics, such as hydrochlorothiazide (HCTZ) Estrogen and progestin hormones Other medications for diabetes  Steroid medications, such as prednisone or cortisone Testosterone Thyroid hormones Medications that may mask symptoms of low blood sugar include: Beta blockers, such as atenolol, metoprolol, propranolol Clonidine Guanethidine Reserpine This list may not  describe all possible interactions. Give your health care provider a list of all the medicines, herbs, non-prescription drugs, or dietary supplements you use. Also tell them if you smoke, drink alcohol, or use illegal drugs. Some items may interact with your medicine. What should I watch for while using this medication? Visit your care team for regular checks on your progress. Your care team will monitor your hemoglobin A1C. This is a simple blood test. It measures your average blood sugar level over the past 3 months. It will help you and your care team manage your diabetes. Learn how to check your blood sugar levels. Know the symptoms of low and high blood sugar and how to manage them. Always carry a source of quick sugar with you for symptoms of low blood sugar. Examples include glucose tablets, juice, or sugar candy. Teach your family members, friends, and others how to help you if your blood sugar is too low and you are not awake enough to treat it. Talk to your care team if you have high blood sugar. You may need to adjust your insulin dose. Many factors can cause high blood sugar, including illness, stress, or a change in activity. Do not skip meals. Ask your care team if you should avoid alcohol. Many cough and cold products contain sugar or alcohol. These can affect blood sugar levels. Make sure that you have the correct syringe for the type of insulin you use. Do not change the brand or type of insulin or syringe unless your care team tells you to. Switching insulin brand or type can affect your blood sugar enough to cause serious adverse effects. Always keep an extra supply of insulin and related supplies on hand. Only use syringes once. Get rid of syringes and needles in a closed container to prevent accidental needle sticks. Do not share insulin pens or cartridges with anyone, even if the needle is changed. Each pen should only be used by one person. Sharing could cause an infection. Do not  use a syringe to take insulin out of an insulin pen. Doing this may result in the wrong dose of insulin. Wear a medical ID bracelet or chain. Carry a card that describes your condition. List the medications and doses you take on the card. What side effects may I notice from receiving this medication? Side effects that you should report to your care team as soon as possible: Allergic reactions--skin rash, itching, hives, swelling of the face, lips, tongue, or throat Low blood sugar (hypoglycemia)--tremors or shaking, anxiety, sweating, cold or clammy skin, confusion, dizziness, rapid heartbeat Low potassium level--muscle pain or cramps, unusual weakness or fatigue, fast or irregular heartbeat, constipation Side effects that usually do not require medical attention (report to your care team if they continue or are bothersome): Lipodystrophy--hardening or scarring of tissue at injection site Pain, redness, or irritation at injection site Weight gain This list may not describe all possible side effects. Call your doctor for medical advice about side effects. You may report side effects to FDA at 1-800-FDA-1088. Where should I keep my medication? Keep out of the reach of children and pets. Storage and expiration dates for different insulin products may vary. Check the label for information on how to store your insulin. Talk to your care team if you have  any questions. Do not freeze. Protect from direct light and heat. Do not use insulin if it is exposed to temperatures above 37 degrees C (98.6 degrees F). Do not use insulin if it has been frozen. Fiasp multi-dose vials, Novolog multiple-dose vials, or Fiasp FlexTouch pen Unopened (not in-use): Store at room temperature up to 30 degrees C (86 degrees F) for up to 28 days, or refrigerated until the expiration date. Opened (in-use): Store at room temperature or refrigerated for up to 28 days. Fiasp PenFill cartridges, Novolog PenFill cartridges, Novolog  FlexPen, or Novolog FlexTouch Unopened (not in-use): Store at room temperature up to 30 degrees C (86 degrees F) for up to 28 days, or refrigerated until the expiration date. Opened (in-use): Store at room temperature for up to 28 days. Do not refrigerate. Fiasp PumpCart cartridges Unopened (not in-use): Store at room temperature up to 30 degrees C (86 degrees F) for up to 18 days (including 4 days in the pump), or refrigerated until the expiration date. Opened (in-use): Store at room temperature for up to 4 days. Do not refrigerate. Insulin Pump Users Change the insulin in your pump reservoir as directed by the pump user manual or the insulin label, whichever is first. To get rid of medications that are no longer needed or have expired: Take the medication to a medication take-back program. Check with your pharmacy or law enforcement to find a location. If you cannot return the medication, ask your pharmacist or care team how to get rid of this medication safely. NOTE: This sheet is a summary. It may not cover all possible information. If you have questions about this medicine, talk to your doctor, pharmacist, or health care provider.  2024 Elsevier/Gold Standard (2021-10-17 00:00:00)

## 2022-10-23 NOTE — Progress Notes (Signed)
 START ON PATHWAY REGIMEN - Pancreatic Adenocarcinoma     A cycle is every 14 days:     Irinotecan       Oxaliplatin       Leucovorin       Fluorouracil    **Always confirm dose/schedule in your pharmacy ordering system**  Patient Characteristics: Preoperative, M0 (Clinical Staging), Resectable, Neoadjuvant Therapy Planned, BRCA1/2 and PALB2 Mutation Absent/Unknown Therapeutic Status: Preoperative, M0 (Clinical Staging) AJCC T Category: cT2 AJCC N Category: cN0 Resectability Status: Resectable AJCC M Category: cM0 AJCC 8 Stage Grouping: IB BRCA1/2 Mutation Status: Awaiting Test Results PALB2 Mutation Status: Awaiting Test Results Intent of Therapy: Curative Intent, Discussed with Patient

## 2022-10-24 ENCOUNTER — Inpatient Hospital Stay: Payer: Medicare HMO

## 2022-10-24 ENCOUNTER — Inpatient Hospital Stay: Payer: Medicare HMO | Admitting: Licensed Clinical Social Worker

## 2022-10-24 ENCOUNTER — Telehealth: Payer: Self-pay | Admitting: Genetic Counselor

## 2022-10-24 ENCOUNTER — Encounter: Payer: Self-pay | Admitting: *Deleted

## 2022-10-24 ENCOUNTER — Other Ambulatory Visit: Payer: Self-pay

## 2022-10-24 ENCOUNTER — Telehealth: Payer: Self-pay

## 2022-10-24 LAB — CANCER ANTIGEN 19-9: CA 19-9: 318 U/mL — ABNORMAL HIGH (ref 0–35)

## 2022-10-24 NOTE — Progress Notes (Signed)
Initial RN Navigator Patient Visit  Name: Alexander [name] Diagnosis: Pancreatic  Met with patient after their chemo education. Gave patient "Your Patient Navigator" handout which explains my role, areas in which I am able to help, and all the contact information for myself and the office. Also gave patient MD and Navigator business card.  New patient packet given to patient which includes: orientation to office and staff; campus directory; education on My Chart and Advance Directives; and patient centered education on pancreatic cancer.  Reviewed with patient that he would qualify for genetic testing based on his diagnosis. Also provided him with genetic flyer. He is interested in proceeding and we will draw kit when he's here on Tuesday.   Patient understands all follow up procedures and expectations. They have my number to reach out for any further clarification or additional needs.   Oncology Nurse Navigator Documentation     10/24/2022   12:00 PM  Oncology Nurse Navigator Flowsheets  Navigator Follow Up Date: 10/31/2022  Navigator Follow Up Reason: Chemotherapy  Navigator Location [facility]  Navigator Encounter Type Education  Patient Visit Type MedOnc  Treatment Phase Pre-Tx/Tx Discussion  Barriers/Navigation Needs Coordination of Care;Education  Education Other  Interventions Education  Acuity Level 2-Minimal Needs (1-2 Barriers Identified)  Education Method Written;Verbal  Support Groups/Services Friends and Family  Time Spent with Patient 30

## 2022-10-24 NOTE — Patient Outreach (Signed)
Care Management  Transitions of Care Program Transitions of Care Post-discharge week 2  10/24/2022 Name: Alexander [name] MRN: [id number] DOB: 1940-02-12  Subjective: Alexander [name] is a 82 y.o. year old male who is a primary care patient of Shirline Frees, NP. The Care Management team was unable to reach the patient by phone to assess and address transitions of care needs.  Patient is currently at  PCP appt ( resch from 10/15)  Plan: Additional outreach attempts will be made to reach the patient enrolled in the [facility] Program (Post Inpatient/ED Visit).  Susa Loffler , BSN, RN Care Management Coordinator [place]   [facility] [email or web address] Direct Dial: [phone]

## 2022-10-24 NOTE — Telephone Encounter (Signed)
Alexander [name] messaged me that blood will be drawn for genetic testing on 10/29.  Results will go into the S drive.

## 2022-10-24 NOTE — Progress Notes (Signed)
[facility] Clinical Social Work  Initial Assessment   Alexander [name] is a 82 y.o. year old male contacted by phone. Clinical Social Work was referred by nurse navigator for assessment of psychosocial needs.   SDOH (Social Determinants of Health) assessments performed: Yes SDOH Interventions    Flowsheet Row Clinical Support from 09/09/2021 in [facility] [place] [facility] at [place] Clinical Support from 09/08/2020 in [facility] at [place] Clinical Support from 09/26/2019 in [facility] at [place] Chronic Care Management from 07/03/2019 in [facility] at [place]  SDOH Interventions      Food Insecurity Interventions Intervention Not Indicated Intervention Not Indicated Intervention Not Indicated --  Housing Interventions Intervention Not Indicated Intervention Not Indicated Intervention Not Indicated --  Transportation Interventions Intervention Not Indicated Intervention Not Indicated -- Intervention Not Indicated  Utilities Interventions Intervention Not Indicated -- -- --  Alcohol Usage Interventions Intervention Not Indicated (Score <7) -- -- --  Depression Interventions/Treatment  -- -- PHQ2-9 Score <4 Follow-up Not Indicated --  Financial Strain Interventions Intervention Not Indicated Intervention Not Indicated -- Intervention Not Indicated  Physical Activity Interventions Intervention Not Indicated Intervention Not Indicated Intervention Not Indicated --  Stress Interventions Intervention Not Indicated Intervention Not Indicated Intervention Not Indicated --  Social Connections Interventions Intervention Not Indicated Intervention Not Indicated Intervention Not Indicated --       SDOH Screenings   Food Insecurity: No Food Insecurity (10/16/2022)  Housing: Patient Declined (10/16/2022)  Transportation Needs: No Transportation Needs (10/16/2022)  Utilities: Not At Risk (10/13/2022)  Alcohol Screen: Low Risk  (09/09/2021)   Depression (PHQ2-9): Low Risk  (01/27/2022)  Financial Resource Strain: Low Risk  (10/16/2022)  Physical Activity: Sufficiently Active (10/16/2022)  Social Connections: Socially Integrated (10/16/2022)  Stress: No Stress Concern Present (10/16/2022)  Tobacco Use: Low Risk  (10/23/2022)  Recent Concern: Tobacco Use - Medium Risk (10/18/2022)   Received from [facility]  Health Literacy: Low Risk  (10/03/2022)   Received from [facility]     Distress Screen completed: No     No data to display            Family/Social Information:  Housing Arrangement: patient lives with his wife, Alexander [name] members/support persons in your life? Family and Friends Transportation concerns: no  Employment: Retired  Income source: Actor concerns: No Type of concern: None Food access concerns: no Religious or spiritual practice: Yes-Patient identifies as Jewish. Services Currently in place:  [employer] Medicare  Coping/ Adjustment to diagnosis: Patient understands treatment plan and what happens next? yes Concerns about diagnosis and/or treatment: I'm not especially worried about anything Patient reported stressors: Insurance.  He stated he will contact his insurance company to verify transportation and home care benefits. Hopes and/or priorities: Family Patient enjoys time with family/ friends Current coping skills/ strengths: Active sense of humor , Average or above average intelligence , Capable of independent living , Communication skills , Contractor , General fund of knowledge , Motivation for treatment/growth , and Supportive family/friends     SUMMARY: Current SDOH Barriers:  None per patient.  Clinical Social Work Clinical Goal(s):  No clinical social work goals at this time  Interventions: Discussed common feeling and emotions when being diagnosed with cancer, and the importance of support during treatment Informed patient of the  support team roles and support services at [facility] Provided CSW contact information and encouraged patient to call with any questions or concerns Provided patient with  information about social work role and the [employer].   Follow Up Plan: Patient will contact CSW with any support or resource needs Patient verbalizes understanding of plan: Yes    Pascal Lux Alexander Kozub, LCSW Clinical Social Worker [facility]

## 2022-10-24 NOTE — Progress Notes (Signed)
Pharmacist Chemotherapy Monitoring - Initial Assessment    Anticipated start date: 10/31/22   The following has been reviewed per standard work regarding the patient's treatment regimen: The patient's diagnosis, treatment plan and drug doses, and organ/hematologic function Lab orders and baseline tests specific to treatment regimen  The treatment plan start date, drug sequencing, and pre-medications Prior authorization status  Patient's documented medication list, including drug-drug interaction screen and prescriptions for anti-emetics and supportive care specific to the treatment regimen The drug concentrations, fluid compatibility, administration routes, and timing of the medications to be used The patient's access for treatment and lifetime cumulative dose history, if applicable  The patient's medication allergies and previous infusion related reactions, if applicable   Changes made to treatment plan:  treatment plan date  Follow up needed:  N/A   Alexander [name], Alexander [name], [name], 10/24/2022  10:53 AM

## 2022-10-24 NOTE — Progress Notes (Signed)
Gave Patient and family ChemoEducation . Discussed pts treatments and frequency, medications to take at home prior to and after treatments, Side effects of drugs, when to call office, how to care for port-a-cath after placement and when taking chemo home. Gave pt and family booklets on nutrition and tips for eating, small frequent meals, protein shakes and substitutes. All questions answered, pt, wife and grand daughter verbalized understanding.  Pt to have port-a-cath placed this week prior to 1st chemo treatment.

## 2022-10-25 ENCOUNTER — Encounter: Payer: Self-pay | Admitting: Adult Health

## 2022-10-25 ENCOUNTER — Ambulatory Visit: Payer: Medicare HMO | Admitting: Adult Health

## 2022-10-25 ENCOUNTER — Other Ambulatory Visit (HOSPITAL_COMMUNITY): Payer: Self-pay | Admitting: Student

## 2022-10-25 ENCOUNTER — Other Ambulatory Visit: Payer: Self-pay

## 2022-10-25 ENCOUNTER — Telehealth: Payer: Self-pay

## 2022-10-25 VITALS — BP 128/78 | HR 55 | Temp 97.6°F | Ht 71.0 in | Wt 141.0 lb

## 2022-10-25 DIAGNOSIS — E1169 Type 2 diabetes mellitus with other specified complication: Secondary | ICD-10-CM

## 2022-10-25 DIAGNOSIS — Z794 Long term (current) use of insulin: Secondary | ICD-10-CM

## 2022-10-25 NOTE — Telephone Encounter (Signed)
Please advise on the dose. It states 25 units on medication but labeled as pt taking diffrently.

## 2022-10-25 NOTE — Patient Outreach (Signed)
Care Management  Transitions of Care Program Transitions of Care Post-discharge week 2   10/25/2022 Name: Alexander [name] MRN: [id number] DOB: Jun 01, 1940  Subjective: Alexander [name] is a 82 y.o. year old male who is a primary care patient of Shirline Frees, NP. The Care Management team Engaged with patient Engaged with patient by telephone to assess and address transitions of care needs.   Consent to Services:  Patient was given information about care management services, agreed to services, and gave verbal consent to participate.   Assessment:   Patient voices no new complaints Patient has not developed/ reported any new Medical issues / Dx or acute changes.- since last follow-up call for most recent  Hospital stay    10/10-10/12  / 2024  He is doing great, he says he feels fine He was seen by PCP today, He had meeting with Oncology 10/22 . They reviewed plan for treatment . He has no questions or concerns No medication changes  Patient educated on red flags s/s to watch for and was encouraged to report any of these identified , any new symptoms , changes in baseline or  medication regimen,  change in health status  /  well-being, or safety concerns to PCP and / or the  [facility] Case Management team .          SDOH Interventions    Flowsheet Row Clinical Support from 09/09/2021 in [facility] at [place] Clinical Support from 09/08/2020 in [facility] [place] [facility] at [place] Clinical Support from 09/26/2019 in [facility] [place] [facility] at [place] Chronic Care Management from 07/03/2019 in [facility] at [place]  SDOH Interventions      Food Insecurity Interventions Intervention Not Indicated Intervention Not Indicated Intervention Not Indicated --  Housing Interventions Intervention Not Indicated Intervention Not Indicated Intervention Not Indicated --  Transportation Interventions Intervention Not Indicated Intervention Not  Indicated -- Intervention Not Indicated  Utilities Interventions Intervention Not Indicated -- -- --  Alcohol Usage Interventions Intervention Not Indicated (Score <7) -- -- --  Depression Interventions/Treatment  -- -- PHQ2-9 Score <4 Follow-up Not Indicated --  Financial Strain Interventions Intervention Not Indicated Intervention Not Indicated -- Intervention Not Indicated  Physical Activity Interventions Intervention Not Indicated Intervention Not Indicated Intervention Not Indicated --  Stress Interventions Intervention Not Indicated Intervention Not Indicated Intervention Not Indicated --  Social Connections Interventions Intervention Not Indicated Intervention Not Indicated Intervention Not Indicated --        Goals Addressed             This Visit's Progress    TOC Care Plan       Current Barriers:  Care Coordination s/p hospital discharge for on-going medical conditions and s/s management    RNCM Clinical Goal(s):  Patient will work with the Care Management team over the next 30 days to address Transition of Care Barriers: Medication Management Provider appointments take all medications exactly as prescribed and will call provider for medication related questions as evidenced by and change in s/s BG not controlled with medication or interventions  attend all scheduled medical appointments: with PCP and oncologist  as evidenced by no missed appt no unplanned ER visits  demonstrate ongoing self health care management ability notifying PCP and Specialists of ant change in status  as evidenced by no unmanaged or adverse side effects and acute changes will be managed   through collaboration with RN Care manager, provider, and care team.   Interventions: Evaluation  of current treatment plan related to  self management and patient's adherence to plan as established by provider  Transitions of Care:  Goal on track:  Yes. Doctor Visits  - discussed the importance of doctor  visits  Patient Goals/Self-Care Activities: Participate in Transition of Care Program/Attend [facility] scheduled calls Take all medications as prescribed Attend all scheduled provider appointments Call pharmacy for medication refills 3-7 days in advance of running out of medications Perform all self care activities independently  Perform IADL's (shopping, preparing meals, housekeeping, managing finances) independently Call provider office for new concerns or questions   Follow Up Plan:  Telephone follow up appointment with care management team member scheduled for:  1030/24 @ 1:00pm  The patient has been provided with contact information for the care management team and has been advised to call with any health related questions or concerns.          Plan: The patient has been provided with contact information for the care management team and has been advised to call with any health related questions or concerns.   Routine follow-up and on-going assessment evaluation and education of disease processes, recommended interventions for both chronic and acute medical conditions , will occur during each  visit along with ongoing  management of symptoms ,medication reviews and reconciliation. Will make changes and updates episodically to Care Plan and initiate visits / follow-up as indicated .   Patient provided with Contact information for [facility] CM   Reviewed goals for care , patient   verbalized understanding and agreement with current POC.   Susa Loffler , BSN, RN Care Management Coordinator [place]   [facility] [email or web address] Direct Dial: [phone]

## 2022-10-25 NOTE — Progress Notes (Signed)
Subjective:    Patient ID: Alexander [name], male    DOB: 07-02-1940, 82 y.o.   MRN: [id number]  HPI 82 year old male who  has a past medical history of Arthritis, CAD (coronary artery disease), Diverticulosis of colon, GERD (gastroesophageal reflux disease), GI bleed, Gout, Hematuria, microscopic, Hemorrhoids, Hyperglycemia, Hyperlipidemia, Hypertension, Lumbar back pain, Microalbuminuria, Overweight(278.02), Pancreatic cancer (HCC) (09/22/2022), Polycythemia rubra vera (HCC), and Tubular adenoma of colon (07/2012).  He presents to the office today for follow-up regarding diabetes mellitus type 2.  He has a recent diagnosis of attic cancer and has been increasing his calories to help him gain weight.  In the past he was on glipizide at variousdoses but this was inadequate to control his blood sugars.  We have been titrating his long-acting insulin up to 20 units daily.  Late last week when he was at his oncologist it was noted that his blood sugars are still elevated and he was started on 5 units of Humulin R 3 times daily before meals.  Today he reports that his oncologist increased his Lantus to 25 units daily.  Yesterday was the first day that he started using Humulin R and his granddaughter who helps administer his insulin accidentally gave him 15 units.  Blood sugars quickly plummeted into the 60s.  EMS was called but he did not go to the hospital as he was able to bring his blood sugars back up.  He did not have any signs or symptoms of hypoglycemia.  Today in the office his Josephine Igo sensor shows a blood sugar of 213 but he did not take his afternoon fast acting insulin.  In 2 days he is having a port placed and they want him n.p.o. after that he will restart his insulin regimen as directed.   Review of Systems See HPI   Past Medical History:  Diagnosis Date   Arthritis    "minor; shoulders primarily" (05/20/2013)   CAD (coronary artery disease)    a. 05/2013 - Chest pain/unstable  angina and dynamic EKG changes showing cath showing atherosclerotic coronary artery disease manifested as diffuse ectasia, normal EF.   Diverticulosis of colon    GERD (gastroesophageal reflux disease)    GI bleed    secondary to Aspirin   Gout    Hematuria, microscopic    Hemorrhoids    Hyperglycemia    Hyperlipidemia    Hypertension    Lumbar back pain    Microalbuminuria    Overweight(278.02)    Pancreatic cancer (HCC) 09/22/2022   Polycythemia rubra vera (HCC)    Tubular adenoma of colon 07/2012    Social History   Socioeconomic History   Marital status: Married    Spouse name: Not on file   Number of children: 3   Years of education: Not on file   Highest education level: Some college, no degree  Occupational History   Occupation: retired Garment/textile technologist: RETIRED  Tobacco Use   Smoking status: Never   Smokeless tobacco: Never   Tobacco comments:    NEVER USED TOBACCO  Vaping Use   Vaping status: Never Used  Substance and Sexual Activity   Alcohol use: No    Alcohol/week: 0.0 standard drinks of alcohol   Drug use: No   Sexual activity: Yes    Partners: Female  Other Topics Concern   Not on file  Social History Narrative   Marriedfor 52 years    Daughter, Physicist, medical (pediatrician--lives in [place], [place]), one in  [place] - psychologist. One in [place] - Chartered loss adjuster          Social Determinants of Health   Financial Resource Strain: Low Risk  (10/16/2022)   Overall Financial Resource Strain (CARDIA)    Difficulty of Paying Living Expenses: Not hard at all  Food Insecurity: No Food Insecurity (10/16/2022)   Hunger Vital Sign    Worried About Running Out of Food in the Last Year: Never true    Ran Out of Food in the Last Year: Never true  Transportation Needs: No Transportation Needs (10/16/2022)   PRAPARE - Administrator, Civil Service (Medical): No    Lack of Transportation (Non-Medical): No  Physical Activity: Sufficiently Active (10/16/2022)    Exercise Vital Sign    Days of Exercise per Week: 3 days    Minutes of Exercise per Session: 60 min  Stress: No Stress Concern Present (10/16/2022)   Alexander [name] of Occupational Health - Occupational Stress Questionnaire    Feeling of Stress : Only a little  Social Connections: Socially Integrated (10/16/2022)   Social Connection and Isolation Panel [NHANES]    Frequency of Communication with Friends and Family: More than three times a week    Frequency of Social Gatherings with Friends and Family: More than three times a week    Attends Religious Services: 1 to 4 times per year    Active Member of [employer] or Organizations: Yes    Attends Banker Meetings: Never    Marital Status: Married  Catering manager Violence: Not At Risk (10/13/2022)   Humiliation, Afraid, Rape, and Kick questionnaire    Fear of Current or Ex-Partner: No    Emotionally Abused: No    Physically Abused: No    Sexually Abused: No    Past Surgical History:  Procedure Laterality Date   BILIARY STENT PLACEMENT N/A 09/02/2022   Procedure: BILIARY STENT PLACEMENT;  Surgeon: Hilarie Fredrickson, MD;  Location: [facility];  Service: Gastroenterology;  Laterality: N/A;   BILIARY STENT PLACEMENT  10/13/2022   Procedure: BILIARY STENT PLACEMENT;  Surgeon: Meryl Dare, MD;  Location: [facility] ENDOSCOPY;  Service: Gastroenterology;;   BIOPSY  09/11/2022   Procedure: BIOPSY;  Surgeon: Lemar Lofty., MD;  Location: [facility] ENDOSCOPY;  Service: Gastroenterology;;   CARDIAC CATHETERIZATION  05/20/2013   CATARACT EXTRACTION W/ INTRAOCULAR LENS  IMPLANT, BILATERAL Bilateral 2008   CYSTECTOMY  ~ 2000   " SEBACEOUS cyst removed from between shoulder blades"   ENDOSCOPIC RETROGRADE CHOLANGIOPANCREATOGRAPHY (ERCP) WITH PROPOFOL N/A 09/02/2022   Procedure: ENDOSCOPIC RETROGRADE CHOLANGIOPANCREATOGRAPHY (ERCP) WITH PROPOFOL;  Surgeon: Hilarie Fredrickson, MD;  Location: [facility];  Service: Gastroenterology;  Laterality: N/A;    ENDOSCOPIC RETROGRADE CHOLANGIOPANCREATOGRAPHY (ERCP) WITH PROPOFOL N/A 10/13/2022   Procedure: ENDOSCOPIC RETROGRADE CHOLANGIOPANCREATOGRAPHY (ERCP) WITH PROPOFOL;  Surgeon: Meryl Dare, MD;  Location: [facility] ENDOSCOPY;  Service: Gastroenterology;  Laterality: N/A;   ESOPHAGOGASTRODUODENOSCOPY N/A 09/11/2022   Procedure: ESOPHAGOGASTRODUODENOSCOPY (EGD);  Surgeon: Lemar Lofty., MD;  Location: [facility];  Service: Gastroenterology;  Laterality: N/A;   EUS N/A 09/11/2022   Procedure: UPPER ENDOSCOPIC ULTRASOUND (EUS) RADIAL;  Surgeon: Lemar Lofty., MD;  Location: [facility] ENDOSCOPY;  Service: Gastroenterology;  Laterality: N/A;   EXCISIONAL HEMORRHOIDECTOMY  1970's   FINE NEEDLE ASPIRATION N/A 09/11/2022   Procedure: FINE NEEDLE ASPIRATION (FNA) LINEAR;  Surgeon: Lemar Lofty., MD;  Location: [facility] ENDOSCOPY;  Service: Gastroenterology;  Laterality: N/A;   INGUINAL HERNIA REPAIR Right ~ 2010   LEFT HEART  CATHETERIZATION WITH CORONARY ANGIOGRAM N/A 05/20/2013   Procedure: LEFT HEART CATHETERIZATION WITH CORONARY ANGIOGRAM;  Surgeon: Peter M Swaziland, MD;  Location: [facility] CATH LAB;  Service: Cardiovascular;  Laterality: N/A;   SPHINCTEROTOMY  09/02/2022   Procedure: SPHINCTEROTOMY;  Surgeon: Hilarie Fredrickson, MD;  Location: [facility];  Service: Gastroenterology;;   Francine Graven REMOVAL  10/13/2022   Procedure: STENT REMOVAL;  Surgeon: Meryl Dare, MD;  Location: [facility] ENDOSCOPY;  Service: Gastroenterology;;   TONSILLECTOMY AND ADENOIDECTOMY  1940's   VASECTOMY      Family History  Problem Relation Age of Onset   Colon cancer Mother        died at 37, colorectal   Diabetes Mother    Heart disease Father 32       MI   Diabetes Father    Stomach cancer Maternal Grandfather    Cancer Maternal Uncle     Allergies  Allergen Reactions   Metformin And Related Diarrhea   Metformin Hcl Diarrhea    Current Outpatient Medications on File Prior to Visit  Medication Sig Dispense  Refill   Accu-Chek Softclix Lancets lancets Use to test blood glucose once daily 100 each 3   amLODipine (NORVASC) 10 MG tablet TAKE ONE TABLET BY MOUTH DAILY 90 tablet 3   Blood Glucose Calibration (EMBRACE PRO GLUCOSE CONTROL) LIQD Use to check blood glucose TID 1 each    Blood Glucose Monitoring Suppl (ACCU-CHEK AVIVA PLUS) w/Device KIT Use to test blood glucose once daily 1 kit 0   Continuous Glucose Sensor (FREESTYLE LIBRE 3 SENSOR) MISC 1 Device by Does not apply route every 14 (fourteen) days. Place 1 sensor on the skin every 14 days. Use to check glucose continuously 2 each 6   glucose blood (EMBRACE PRO GLUCOSE TEST) test strip Use as instructed 100 each 12   insulin glargine (LANTUS) 100 UNIT/ML injection Inject 0.1 mLs (10 Units total) into the skin daily. (Patient taking differently: Inject 20 Units into the skin daily.) 10 mL 1   Insulin Pen Needle 29G X MISC Use daily for insulin pen 90 each 1   insulin regular (HUMULIN R) 100 units/mL injection Inject 0.05 mLs (5 Units total) into the skin 3 (three) times daily before meals. 10 mL 11   Insulin Syringe-Needle U-100 (INSULIN SYRINGE .5CC/30GX5/16") 30G X 5/16" 0.5 ML MISC Inject 5 Units into the skin 3 (three) times daily before meals. 100 each 3   lipase/protease/amylase (CREON) 36000 UNITS CPEP capsule Take 2 capsules (72,000 Units total) by mouth 3 (three) times daily with meals. May also take 1 capsule (36,000 Units total) as needed (with snacks - up to 4 snacks daily). 180 capsule 5   OLANZapine (ZYPREXA) 5 MG tablet Take 1 tablet (5 mg total) by mouth at bedtime. 30 tablet 4   pantoprazole (PROTONIX) 40 MG tablet Take 1 tablet (40 mg total) by mouth 2 (two) times daily before a meal. 60 tablet 6   glipiZIDE (GLUCOTROL XL) 10 MG 24 hr tablet Take 10 mg by mouth in the morning and at bedtime.     No current facility-administered medications on file prior to visit.    BP 128/78   Pulse (!) 55   Temp 97.6 F (36.4 C) (Oral)    Ht 5\' 11"  (1.803 m)   Wt 141 lb (64 kg)   SpO2 97%   BMI 19.67 kg/m       Objective:   Physical Exam Vitals and nursing note reviewed.  Constitutional:  Appearance: Normal appearance.  Musculoskeletal:        General: Normal range of motion.  Skin:    General: Skin is dry.  Neurological:     General: No focal deficit present.     Mental Status: He is alert and oriented to person, place, and time.  Psychiatric:        Mood and Affect: Mood normal.        Behavior: Behavior normal.        Thought Content: Thought content normal.        Judgment: Judgment normal.       Assessment & Plan:  1. Type 2 diabetes mellitus with other specified complication, with long-term current use of insulin (HCC) -Encouraged to take insulin as directed.  I will follow-up with him early next week after he has port placed and has had the weekend to get data about his blood sugar levels.  We can further adjust as needed but he will start chemo at the beginning and next week so that may play a part in further adjustments.  He is trying to gain weight, I encouraged him to eat more protein and lower sugar foods to help keep his blood sugar stable.  Shirline Frees, NP  Time spent with patient today was 31 minutes which consisted of chart review, discussing DM type 2, work up, treatment answering questions and documentation.

## 2022-10-27 ENCOUNTER — Ambulatory Visit (HOSPITAL_COMMUNITY)
Admission: RE | Admit: 2022-10-27 | Discharge: 2022-10-27 | Disposition: A | Discharge status: Home / Self Care | Payer: Medicare HMO | Source: Ambulatory Visit | Attending: Hematology & Oncology | Admitting: Hematology & Oncology

## 2022-10-27 ENCOUNTER — Other Ambulatory Visit: Payer: Self-pay

## 2022-10-27 ENCOUNTER — Other Ambulatory Visit: Payer: Self-pay | Admitting: Hematology & Oncology

## 2022-10-27 DIAGNOSIS — I2510 Atherosclerotic heart disease of native coronary artery without angina pectoris: Secondary | ICD-10-CM | POA: Diagnosis not present

## 2022-10-27 DIAGNOSIS — D45 Polycythemia vera: Secondary | ICD-10-CM | POA: Diagnosis not present

## 2022-10-27 DIAGNOSIS — Z794 Long term (current) use of insulin: Secondary | ICD-10-CM | POA: Insufficient documentation

## 2022-10-27 DIAGNOSIS — E119 Type 2 diabetes mellitus without complications: Secondary | ICD-10-CM | POA: Diagnosis not present

## 2022-10-27 DIAGNOSIS — I10 Essential (primary) hypertension: Secondary | ICD-10-CM | POA: Insufficient documentation

## 2022-10-27 DIAGNOSIS — C252 Malignant neoplasm of tail of pancreas: Secondary | ICD-10-CM | POA: Insufficient documentation

## 2022-10-27 HISTORY — PX: IR IMAGING GUIDED PORT INSERTION: IMG5740

## 2022-10-27 LAB — GLUCOSE, CAPILLARY
Glucose-Capillary: 166 mg/dL — ABNORMAL HIGH (ref 70–99)
Glucose-Capillary: 113 mg/dL — ABNORMAL HIGH (ref 70–99)

## 2022-10-27 MED ORDER — HEPARIN SOD (PORK) LOCK FLUSH 100 UNIT/ML IV SOLN
500.0000 [IU] | Freq: Once | INTRAVENOUS | Status: AC
  Administered 2022-10-27: 500 [IU] via INTRAVENOUS

## 2022-10-27 MED ORDER — LIDOCAINE HCL 1 % IJ SOLN
20.0000 mL | Freq: Once | INTRAMUSCULAR | Status: AC
  Administered 2022-10-27: 10 mL

## 2022-10-27 MED ORDER — FENTANYL CITRATE (PF) 100 MCG/2ML IJ SOLN
INTRAMUSCULAR | Status: AC
  Filled 2022-10-27: qty 2

## 2022-10-27 MED ORDER — MIDAZOLAM HCL 2 MG/2ML IJ SOLN
INTRAMUSCULAR | Status: AC
  Filled 2022-10-27: qty 2

## 2022-10-27 MED ORDER — HEPARIN SODIUM (PORCINE) 1000 UNIT/ML IJ SOLN
INTRAMUSCULAR | Status: AC
  Filled 2022-10-27: qty 10

## 2022-10-27 MED ORDER — HEPARIN SOD (PORK) LOCK FLUSH 100 UNIT/ML IV SOLN
INTRAVENOUS | Status: AC
Start: 2022-10-27 — End: ?
  Filled 2022-10-27: qty 5

## 2022-10-27 MED ORDER — SODIUM CHLORIDE 0.9 % IV SOLN
INTRAVENOUS | Status: DC
Start: 2022-10-27 — End: 2022-10-28

## 2022-10-27 MED ORDER — MIDAZOLAM HCL 2 MG/2ML IJ SOLN
INTRAMUSCULAR | Status: AC | PRN
  Administered 2022-10-27: 1 mg via INTRAVENOUS

## 2022-10-27 MED ORDER — FENTANYL CITRATE (PF) 100 MCG/2ML IJ SOLN
INTRAMUSCULAR | Status: AC | PRN
  Administered 2022-10-27: 25 ug via INTRAVENOUS

## 2022-10-27 MED ORDER — LIDOCAINE-EPINEPHRINE 1 %-1:100000 IJ SOLN
INTRAMUSCULAR | Status: AC
  Filled 2022-10-27: qty 1

## 2022-10-27 NOTE — Procedures (Signed)
Interventional Radiology Procedure Note  Procedure: Port placement.  Indication: Pancreatic Ca  Findings: Please refer to procedural dictation for full description.  Complications: None  EBL: < 10 mL  Charmin Aguiniga, MD [phone]   

## 2022-10-27 NOTE — H&P (Signed)
Chief Complaint: Patient was seen in consultation today for port-a-catheter placement.   Referring Physician(s): Josph Macho  Supervising Physician: Mir, Mauri Reading  Patient Status: [facility] - Out-pt  History of Present Illness: Alexander [name] is a 82 y.o. male with a medical history significant for DM, CAD, HTN, polycythemia vera and recently diagnosed pancreatic cancer.  He has been followed by Dr. Myna Hidalgo for polycythemia vera and during a visit in August 2024 Dr. Myna Hidalgo noticed he was jaundiced and had lost a lot of weight. Work up subsequently revealed pancreatic cancer. He is now s/p biliary stent placement. He is currently being evaluated for possible surgical resection but his cancer team would like him to receive neoadjuvant chemotherapy. A port-a-catheter has been requested for durable venous access.    Past Medical History:  Diagnosis Date   Arthritis    "minor; shoulders primarily" (05/20/2013)   CAD (coronary artery disease)    a. 05/2013 - Chest pain/unstable angina and dynamic EKG changes showing cath showing atherosclerotic coronary artery disease manifested as diffuse ectasia, normal EF.   Diverticulosis of colon    GERD (gastroesophageal reflux disease)    GI bleed    secondary to Aspirin   Gout    Hematuria, microscopic    Hemorrhoids    Hyperglycemia    Hyperlipidemia    Hypertension    Lumbar back pain    Microalbuminuria    Overweight(278.02)    Pancreatic cancer (HCC) 09/22/2022   Polycythemia rubra vera (HCC)    Tubular adenoma of colon 07/2012    Past Surgical History:  Procedure Laterality Date   BILIARY STENT PLACEMENT N/A 09/02/2022   Procedure: BILIARY STENT PLACEMENT;  Surgeon: Hilarie Fredrickson, MD;  Location: [facility] ENDOSCOPY;  Service: Gastroenterology;  Laterality: N/A;   BILIARY STENT PLACEMENT  10/13/2022   Procedure: BILIARY STENT PLACEMENT;  Surgeon: Meryl Dare, MD;  Location: [facility] ENDOSCOPY;  Service: Gastroenterology;;   BIOPSY   09/11/2022   Procedure: BIOPSY;  Surgeon: Lemar Lofty., MD;  Location: [facility] ENDOSCOPY;  Service: Gastroenterology;;   CARDIAC CATHETERIZATION  05/20/2013   CATARACT EXTRACTION W/ INTRAOCULAR LENS  IMPLANT, BILATERAL Bilateral 2008   CYSTECTOMY  ~ 2000   " SEBACEOUS cyst removed from between shoulder blades"   ENDOSCOPIC RETROGRADE CHOLANGIOPANCREATOGRAPHY (ERCP) WITH PROPOFOL N/A 09/02/2022   Procedure: ENDOSCOPIC RETROGRADE CHOLANGIOPANCREATOGRAPHY (ERCP) WITH PROPOFOL;  Surgeon: Hilarie Fredrickson, MD;  Location: [facility]OS[facility];  Service: Gastroenterology;  Laterality: N/A;   ENDOSCOPIC RETROGRADE CHOLANGIOPANCREATOGRAPHY (ERCP) WITH PROPOFOL N/A 10/13/2022   Procedure: ENDOSCOPIC RETROGRADE CHOLANGIOPANCREATOGRAPHY (ERCP) WITH PROPOFOL;  Surgeon: Meryl Dare, MD;  Location: [facility] ENDOSCOPY;  Service: Gastroenterology;  Laterality: N/A;   ESOPHAGOGASTRODUODENOSCOPY N/A 09/11/2022   Procedure: ESOPHAGOGASTRODUODENOSCOPY (EGD);  Surgeon: Lemar Lofty., MD;  Location: [facility] ENDOSCOPY;  Service: Gastroenterology;  Laterality: N/A;   EUS N/A 09/11/2022   Procedure: UPPER ENDOSCOPIC ULTRASOUND (EUS) RADIAL;  Surgeon: Lemar Lofty., MD;  Location: [facility] ENDOSCOPY;  Service: Gastroenterology;  Laterality: N/A;   EXCISIONAL HEMORRHOIDECTOMY  1970's   FINE NEEDLE ASPIRATION N/A 09/11/2022   Procedure: FINE NEEDLE ASPIRATION (FNA) LINEAR;  Surgeon: Lemar Lofty., MD;  Location: [facility] ENDOSCOPY;  Service: Gastroenterology;  Laterality: N/A;   INGUINAL HERNIA REPAIR Right ~ 2010   LEFT HEART CATHETERIZATION WITH CORONARY ANGIOGRAM N/A 05/20/2013   Procedure: LEFT HEART CATHETERIZATION WITH CORONARY ANGIOGRAM;  Surgeon: Peter M Swaziland, MD;  Location: [facility] CATH LAB;  Service: Cardiovascular;  Laterality: N/A;   SPHINCTEROTOMY  09/02/2022   Procedure:  SPHINCTEROTOMY;  Surgeon: Hilarie Fredrickson, MD;  Location: [facility] ENDOSCOPY;  Service: Gastroenterology;;   Francine Graven REMOVAL  10/13/2022   Procedure: STENT  REMOVAL;  Surgeon: Meryl Dare, MD;  Location: [facility] ENDOSCOPY;  Service: Gastroenterology;;   TONSILLECTOMY AND ADENOIDECTOMY  1940's   VASECTOMY      Allergies: Metformin and related and Metformin hcl  Medications: Prior to Admission medications   Medication Sig Start Date End Date Taking? Authorizing Provider  Accu-Chek Softclix Lancets lancets Use to test blood glucose once daily 11/09/21   Nafziger, Kandee Keen, NP  amLODipine (NORVASC) 10 MG tablet TAKE ONE TABLET BY MOUTH DAILY 03/21/22   Nafziger, Kandee Keen, NP  Blood Glucose Calibration (EMBRACE PRO GLUCOSE CONTROL) LIQD Use to check blood glucose TID 04/19/22   Nafziger, Kandee Keen, NP  Blood Glucose Monitoring Suppl (ACCU-CHEK AVIVA PLUS) w/Device KIT Use to test blood glucose once daily 12/11/17   Nafziger, Kandee Keen, NP  Continuous Glucose Sensor (FREESTYLE LIBRE 3 SENSOR) MISC 1 Device by Does not apply route every 14 (fourteen) days. Place 1 sensor on the skin every 14 days. Use to check glucose continuously 09/14/22   Nafziger, Kandee Keen, NP  glucose blood (EMBRACE PRO GLUCOSE TEST) test strip Use as instructed 04/19/22   Nafziger, Kandee Keen, NP  insulin glargine (LANTUS) 100 UNIT/ML injection Inject 0.1 mLs (10 Units total) into the skin daily. Patient taking differently: Inject 25 Units into the skin daily. 10/10/22   Nafziger, Kandee Keen, NP  Insulin Pen Needle 29G X MISC Use daily for insulin pen 10/10/22   Nafziger, Kandee Keen, NP  insulin regular (HUMULIN R) 100 units/mL injection Inject 0.05 mLs (5 Units total) into the skin 3 (three) times daily before meals. 10/23/22   Josph Macho, MD  Insulin Syringe-Needle U-100 (INSULIN SYRINGE .5CC/30GX5/16") 30G X 5/16" 0.5 ML MISC Inject 5 Units into the skin 3 (three) times daily before meals. 10/23/22   Josph Macho, MD  lipase/protease/amylase (CREON) 36000 UNITS CPEP capsule Take 2 capsules (72,000 Units total) by mouth 3 (three) times daily with meals. May also take 1 capsule (36,000 Units total) as needed (with  snacks - up to 4 snacks daily). 10/19/22   Josph Macho, MD  OLANZapine (ZYPREXA) 5 MG tablet Take 1 tablet (5 mg total) by mouth at bedtime. 10/23/22   Josph Macho, MD  pantoprazole (PROTONIX) 40 MG tablet Take 1 tablet (40 mg total) by mouth 2 (two) times daily before a meal. 09/11/22   Mansouraty, Netty Starring., MD     Family History  Problem Relation Age of Onset   Colon cancer Mother        died at 59, colorectal   Diabetes Mother    Heart disease Father 77       MI   Diabetes Father    Stomach cancer Maternal Grandfather    Cancer Maternal Uncle     Social History   Socioeconomic History   Marital status: Married    Spouse name: Not on file   Number of children: 3   Years of education: Not on file   Highest education level: Some college, no degree  Occupational History   Occupation: retired Garment/textile technologist: RETIRED  Tobacco Use   Smoking status: Never   Smokeless tobacco: Never   Tobacco comments:    NEVER USED TOBACCO  Vaping Use   Vaping status: Never Used  Substance and Sexual Activity   Alcohol use: No    Alcohol/week: 0.0 standard drinks of  alcohol   Drug use: No   Sexual activity: Yes    Partners: Female  Other Topics Concern   Not on file  Social History Narrative   Marriedfor 52 years    Daughter, Physicist, medical (pediatrician--lives in [place], [place]), one in [place] - Warden/ranger. One in [place] - Chartered loss adjuster          Social Determinants of Health   Financial Resource Strain: Low Risk  (10/16/2022)   Overall Financial Resource Strain (CARDIA)    Difficulty of Paying Living Expenses: Not hard at all  Food Insecurity: No Food Insecurity (10/16/2022)   Hunger Vital Sign    Worried About Running Out of Food in the Last Year: Never true    Ran Out of Food in the Last Year: Never true  Transportation Needs: No Transportation Needs (10/16/2022)   PRAPARE - Administrator, Civil Service (Medical): No    Lack of Transportation (Non-Medical): No   Physical Activity: Sufficiently Active (10/16/2022)   Exercise Vital Sign    Days of Exercise per Week: 3 days    Minutes of Exercise per Session: 60 min  Stress: No Stress Concern Present (10/16/2022)   [employer] of Occupational Health - Occupational Stress Questionnaire    Feeling of Stress : Only a little  Social Connections: Socially Integrated (10/16/2022)   Social Connection and Isolation Panel [NHANES]    Frequency of Communication with Friends and Family: More than three times a week    Frequency of Social Gatherings with Friends and Family: More than three times a week    Attends Religious Services: 1 to 4 times per year    Active Member of [employer] or Organizations: Yes    Attends Banker Meetings: Never    Marital Status: Married    Review of Systems: A 12 point ROS discussed and pertinent positives are indicated in the HPI above.  All other systems are negative.  Review of Systems  Constitutional:  Negative for appetite change and fatigue.  Respiratory:  Negative for cough and shortness of breath.   Cardiovascular:  Negative for chest pain and leg swelling.  Gastrointestinal:  Negative for abdominal pain, diarrhea, nausea and vomiting.  Genitourinary:  Negative for flank pain.  Musculoskeletal:  Negative for back pain.  Neurological:  Negative for dizziness and headaches.    Vital Signs: BP (!) 155/79   Pulse 68   Temp 97.7 F (36.5 C) (Oral)   Resp 18   Ht 5\' 11"  (1.803 m)   Wt 133 lb (60.3 kg)   SpO2 100%   BMI 18.55 kg/m   Physical Exam Constitutional:      General: He is not in acute distress.    Appearance: He is not ill-appearing.  HENT:     Mouth/Throat:     Mouth: Mucous membranes are moist.     Pharynx: Oropharynx is clear.  Cardiovascular:     Rate and Rhythm: Normal rate.  Pulmonary:     Effort: Pulmonary effort is normal.  Abdominal:     Tenderness: There is no abdominal tenderness.  Skin:    General: Skin is warm and  dry.  Neurological:     Mental Status: He is alert and oriented to person, place, and time.  Psychiatric:        Mood and Affect: Mood normal.        Behavior: Behavior normal.        Thought Content: Thought content normal.  Judgment: Judgment normal.     Imaging: CT Chest W Contrast  Addendum Date: 10/23/2022   ADDENDUM REPORT: 10/23/2022 05:16 ADDENDUM: CLINICAL DATA: Pancreas neoplasm. Staging. * Tracking Code: BO * EXAM: CT CHEST WITH CONTRAST CT ABDOMEN AND PELVIS WITHOUT AND WITH CONTRAST TECHNIQUE: Multidetector CT imaging of the chest was performed during intravenous contrast administration. Multidetector CT imaging of the abdomen and pelvis was performed following the standard protocol without and during bolus administration of intravenous contrast. RADIATION DOSE REDUCTION: This exam was performed according to the departmental dose-optimization program which includes automated exposure control, adjustment of the mA and/or kV according to patient size and/or use of iterative reconstruction technique. CONTRAST: 75mL OMNIPAQUE IOHEXOL 300 MG/ML SOLN COMPARISON: MRI abdomen 09/01/2022 and CT AP from 08/31/2022 and CT chest from 06/05/2013 FINDINGS: CT CHEST FINDINGS Cardiovascular: Normal heart size. Aortic atherosclerosis with coronary artery atherosclerotic calcifications. No pericardial effusion. Mediastinum/Nodes: Thyroid gland, trachea, and esophagus appear normal. No enlarged mediastinal or hilar lymph nodes. Lungs/Pleura: No pleural fluid or consolidative change. -Within the posterolateral right lower lobe there is a non solid nodule which measures 1.2 cm, image 73/5. 4 mm internal solid component is suspected. -Within the anterior right lower lobe there is a 5 mm solid nodule which abuts the major fissure, image 96/5. On 08/31/2022 this measured the same. Musculoskeletal: No chest wall mass or suspicious bone lesions identified. CT ABDOMEN AND PELVIS FINDINGS Hepatobiliary: Within  the posterior 7 right lobe of liver there is a 1.5 cm low-density structure, image 83/2. This was characterized as a benign cyst on the MRI from 09/01/2022. No new focal liver lesions. Mild gallbladder scratch set gallbladder appears normal. Pneumobilia is identified. Common bile duct stent is in place. Pneumobilia is identified compatible with biliary patency. There is persistent mild intrahepatic bile duct dilatation which is improved compared with 09/01/2022. Pancreas: The small lesion within the head of pancreas is suboptimally visualized on the current exam. As noted on the previous MRI this is difficult to precisely delineate in therefore accurately compare with previous imaging for interval growth. Best estimate is that this measures approximately 3.6 x 2.5 by 2.5 cm, image 156/4 and image 44/6. Compared with the previous exam this appears similar in size (when remeasured). Spleen: Normal in size without focal abnormality. Adrenals/Urinary Tract: Normal adrenal glands. No nephrolithiasis or obstructive uropathy. Previous Bosniak class 2 cyst off the posterior cortex of the left kidney is unchanged measuring 3.2 cm. No follow-up imaging recommended. Urinary bladder appears within normal limits. Stomach/Bowel: Stomach is nondistended. There is no bowel wall thickening, inflammation or distension. Moderate stool burden is identified throughout the colon. Vascular/Lymphatic: Aortic atherosclerosis. The upper abdominal vascularity remains patent. As before, pancreatic head lesion extends up to and abuts the superior mesenteric vein, image 104/2. Small extension of soft tissue extends up to, and contacts the superior mesenteric artery on image 108/2. No encasement of the celiac trunk. No enlarged lymph nodes identified. Reproductive: Prostate is unremarkable. Other: No ascites or focal fluid collections. No signs of peritoneal nodularity. Musculoskeletal: No acute or significant osseous findings. Lumbar spondylosis  noted. IMPRESSION: 1. The small lesion within the head of pancreas is suboptimally visualized on the current exam. As noted on the previous MRI this is difficult to precisely delineate and therefore accurately compare with previous imaging for interval growth. Best estimate is that this measures approximately 3.6 x 2.5 x 2.5 cm. Compared with the previous exam this appears similar in size (when remeasured). As mentioned previously the tumor appears  to contact (but does not encase) the superior mesenteric vein near the portal venous confluence. There is also a small finger-like a prior projection (from the mass) of soft tissue extending up to and potentially contacting the superior mesenteric artery without encasement. 2. No signs of metastatic disease within the abdomen or pelvis. 3. There is a 1.2 cm non-solid nodule within the posterolateral right lower lobe. 4 mm internal solid component is suspected. This is indeterminate. Follow-up non-contrast CT recommended at 3-6 months to confirm persistence. 4. Common bile duct stent is in place. Pneumobilia is identified compatible with biliary patency. There is persistent mild intrahepatic bile duct dilatation which is improved compared with 09/01/2022. 5. Coronary artery calcifications. 6. Within the anterior right lower lobe there is a 5 mm solid nodule which abuts the major fissure. This appears unchanged from 08/31/2022. Nonspecific. Attention on follow-up imaging advised. 7. Aortic Atherosclerosis (ICD10-I70.0). Electronically Signed   By: Signa Kell M.D.   On: 10/23/2022 05:16   Result Date: 10/23/2022 CLINICAL DATA:  Pancreas neoplasm.  Staging. * Tracking Code: BO * EXAM: CT CHEST WITH CONTRAST CT ABDOMEN AND PELVIS WITH CONTRAST TECHNIQUE: Multidetector CT imaging of the chest was performed during intravenous contrast administration. Multidetector CT imaging of the abdomen and pelvis was performed following the standard protocol during bolus  administration of intravenous contrast. RADIATION DOSE REDUCTION: This exam was performed according to the departmental dose-optimization program which includes automated exposure control, adjustment of the mA and/or kV according to patient size and/or use of iterative reconstruction technique. CONTRAST:  75mL OMNIPAQUE IOHEXOL 300 MG/ML  SOLN COMPARISON:  MRI abdomen 09/01/2022 and CT AP from 08/31/2022 and CT chest from 06/05/2013 FINDINGS: CT CHEST FINDINGS Cardiovascular: Normal heart size. Aortic atherosclerosis with coronary artery atherosclerotic calcifications. No pericardial effusion. Mediastinum/Nodes: Thyroid gland, trachea, and esophagus appear normal. No enlarged mediastinal or hilar lymph nodes. Lungs/Pleura: No pleural fluid or consolidative change. -Within the posterolateral right lower lobe there is a non solid nodule which measures 1.2 cm, image 73/5. 4 mm internal solid component is suspected. -Within the anterior right lower lobe there is a 5 mm solid nodule which abuts the major fissure, image 96/5. On 08/31/2022 this measured the same. Musculoskeletal: No chest wall mass or suspicious bone lesions identified. CT ABDOMEN AND PELVIS FINDINGS Hepatobiliary: Within the posterior 7 right lobe of liver there is a 1.5 cm low-density structure, image 83/2. This was characterized as a benign cyst on the MRI from 09/01/2022. No new focal liver lesions. Mild gallbladder scratch set gallbladder appears normal. Pneumobilia is identified. Common bile duct stent is in place. Pneumobilia is identified compatible with biliary patency. There is persistent mild intrahepatic bile duct dilatation which is improved compared with 09/01/2022. Pancreas: The small lesion within the head of pancreas is suboptimally visualized on the current exam. As noted on the previous MRI this is difficult to precisely delineate in therefore accurately compare with previous imaging for interval growth. Best estimate is that this  measures approximately 3.6 x 2.5 by 2.5 cm, image 156/4 and image 44/6. Compared with the previous exam this appears similar in size (when remeasured). Spleen: Normal in size without focal abnormality. Adrenals/Urinary Tract: Normal adrenal glands. No nephrolithiasis or obstructive uropathy. Previous Bosniak class 2 cyst off the posterior cortex of the left kidney is unchanged measuring 3.2 cm. No follow-up imaging recommended. Urinary bladder appears within normal limits. Stomach/Bowel: Stomach is nondistended. There is no bowel wall thickening, inflammation or distension. Moderate stool burden is identified throughout  the colon. Vascular/Lymphatic: Aortic atherosclerosis. The upper abdominal vascularity remains patent. As before, pancreatic head lesion extends up to and abuts the superior mesenteric vein, image 104/2. Small extension of soft tissue extends up to, and contacts the superior mesenteric artery on image 108/2. No encasement of the celiac trunk. No enlarged lymph nodes identified. Reproductive: Prostate is unremarkable. Other: No ascites or focal fluid collections. No signs of peritoneal nodularity. Musculoskeletal: No acute or significant osseous findings. Lumbar spondylosis noted. IMPRESSION: 1. The small lesion within the head of pancreas is suboptimally visualized on the current exam. As noted on the previous MRI this is difficult to precisely delineate and therefore accurately compare with previous imaging for interval growth. Best estimate is that this measures approximately 3.6 x 2.5 x 2.5 cm. Compared with the previous exam this appears similar in size (when remeasured). As mentioned previously the tumor appears to contact (but does not encase) the superior mesenteric vein near the portal venous confluence. There is also a small finger-like a prior projection (from the mass) of soft tissue extending up to and potentially contacting the superior mesenteric artery without encasement. 2. No signs  of metastatic disease within the abdomen or pelvis. 3. There is a 1.2 cm non-solid nodule within the posterolateral right lower lobe. 4 mm internal solid component is suspected. This is indeterminate. Follow-up non-contrast CT recommended at 3-6 months to confirm persistence. 4. Common bile duct stent is in place. Pneumobilia is identified compatible with biliary patency. There is persistent mild intrahepatic bile duct dilatation which is improved compared with 09/01/2022. 5. Coronary artery calcifications. 6. Within the anterior right lower lobe there is a 5 mm solid nodule which abuts the major fissure. This appears unchanged from 08/31/2022. Nonspecific. Attention on follow-up imaging advised. 7.  Aortic Atherosclerosis (ICD10-I70.0). Electronically Signed: By: Signa Kell M.D. On: 10/04/2022 17:22   CT ABDOMEN PELVIS W WO CONTRAST  Addendum Date: 10/23/2022   ADDENDUM REPORT: 10/23/2022 05:16 ADDENDUM: CLINICAL DATA: Pancreas neoplasm. Staging. * Tracking Code: BO * EXAM: CT CHEST WITH CONTRAST CT ABDOMEN AND PELVIS WITHOUT AND WITH CONTRAST TECHNIQUE: Multidetector CT imaging of the chest was performed during intravenous contrast administration. Multidetector CT imaging of the abdomen and pelvis was performed following the standard protocol without and during bolus administration of intravenous contrast. RADIATION DOSE REDUCTION: This exam was performed according to the departmental dose-optimization program which includes automated exposure control, adjustment of the mA and/or kV according to patient size and/or use of iterative reconstruction technique. CONTRAST: 75mL OMNIPAQUE IOHEXOL 300 MG/ML SOLN COMPARISON: MRI abdomen 09/01/2022 and CT AP from 08/31/2022 and CT chest from 06/05/2013 FINDINGS: CT CHEST FINDINGS Cardiovascular: Normal heart size. Aortic atherosclerosis with coronary artery atherosclerotic calcifications. No pericardial effusion. Mediastinum/Nodes: Thyroid gland, trachea, and  esophagus appear normal. No enlarged mediastinal or hilar lymph nodes. Lungs/Pleura: No pleural fluid or consolidative change. -Within the posterolateral right lower lobe there is a non solid nodule which measures 1.2 cm, image 73/5. 4 mm internal solid component is suspected. -Within the anterior right lower lobe there is a 5 mm solid nodule which abuts the major fissure, image 96/5. On 08/31/2022 this measured the same. Musculoskeletal: No chest wall mass or suspicious bone lesions identified. CT ABDOMEN AND PELVIS FINDINGS Hepatobiliary: Within the posterior 7 right lobe of liver there is a 1.5 cm low-density structure, image 83/2. This was characterized as a benign cyst on the MRI from 09/01/2022. No new focal liver lesions. Mild gallbladder scratch set gallbladder appears normal. Pneumobilia is  identified. Common bile duct stent is in place. Pneumobilia is identified compatible with biliary patency. There is persistent mild intrahepatic bile duct dilatation which is improved compared with 09/01/2022. Pancreas: The small lesion within the head of pancreas is suboptimally visualized on the current exam. As noted on the previous MRI this is difficult to precisely delineate in therefore accurately compare with previous imaging for interval growth. Best estimate is that this measures approximately 3.6 x 2.5 by 2.5 cm, image 156/4 and image 44/6. Compared with the previous exam this appears similar in size (when remeasured). Spleen: Normal in size without focal abnormality. Adrenals/Urinary Tract: Normal adrenal glands. No nephrolithiasis or obstructive uropathy. Previous Bosniak class 2 cyst off the posterior cortex of the left kidney is unchanged measuring 3.2 cm. No follow-up imaging recommended. Urinary bladder appears within normal limits. Stomach/Bowel: Stomach is nondistended. There is no bowel wall thickening, inflammation or distension. Moderate stool burden is identified throughout the colon.  Vascular/Lymphatic: Aortic atherosclerosis. The upper abdominal vascularity remains patent. As before, pancreatic head lesion extends up to and abuts the superior mesenteric vein, image 104/2. Small extension of soft tissue extends up to, and contacts the superior mesenteric artery on image 108/2. No encasement of the celiac trunk. No enlarged lymph nodes identified. Reproductive: Prostate is unremarkable. Other: No ascites or focal fluid collections. No signs of peritoneal nodularity. Musculoskeletal: No acute or significant osseous findings. Lumbar spondylosis noted. IMPRESSION: 1. The small lesion within the head of pancreas is suboptimally visualized on the current exam. As noted on the previous MRI this is difficult to precisely delineate and therefore accurately compare with previous imaging for interval growth. Best estimate is that this measures approximately 3.6 x 2.5 x 2.5 cm. Compared with the previous exam this appears similar in size (when remeasured). As mentioned previously the tumor appears to contact (but does not encase) the superior mesenteric vein near the portal venous confluence. There is also a small finger-like a prior projection (from the mass) of soft tissue extending up to and potentially contacting the superior mesenteric artery without encasement. 2. No signs of metastatic disease within the abdomen or pelvis. 3. There is a 1.2 cm non-solid nodule within the posterolateral right lower lobe. 4 mm internal solid component is suspected. This is indeterminate. Follow-up non-contrast CT recommended at 3-6 months to confirm persistence. 4. Common bile duct stent is in place. Pneumobilia is identified compatible with biliary patency. There is persistent mild intrahepatic bile duct dilatation which is improved compared with 09/01/2022. 5. Coronary artery calcifications. 6. Within the anterior right lower lobe there is a 5 mm solid nodule which abuts the major fissure. This appears unchanged from  08/31/2022. Nonspecific. Attention on follow-up imaging advised. 7. Aortic Atherosclerosis (ICD10-I70.0). Electronically Signed   By: Signa Kell M.D.   On: 10/23/2022 05:16   Result Date: 10/23/2022 CLINICAL DATA:  Pancreas neoplasm.  Staging. * Tracking Code: BO * EXAM: CT CHEST WITH CONTRAST CT ABDOMEN AND PELVIS WITH CONTRAST TECHNIQUE: Multidetector CT imaging of the chest was performed during intravenous contrast administration. Multidetector CT imaging of the abdomen and pelvis was performed following the standard protocol during bolus administration of intravenous contrast. RADIATION DOSE REDUCTION: This exam was performed according to the departmental dose-optimization program which includes automated exposure control, adjustment of the mA and/or kV according to patient size and/or use of iterative reconstruction technique. CONTRAST:  75mL OMNIPAQUE IOHEXOL 300 MG/ML  SOLN COMPARISON:  MRI abdomen 09/01/2022 and CT AP from 08/31/2022 and CT chest from  06/05/2013 FINDINGS: CT CHEST FINDINGS Cardiovascular: Normal heart size. Aortic atherosclerosis with coronary artery atherosclerotic calcifications. No pericardial effusion. Mediastinum/Nodes: Thyroid gland, trachea, and esophagus appear normal. No enlarged mediastinal or hilar lymph nodes. Lungs/Pleura: No pleural fluid or consolidative change. -Within the posterolateral right lower lobe there is a non solid nodule which measures 1.2 cm, image 73/5. 4 mm internal solid component is suspected. -Within the anterior right lower lobe there is a 5 mm solid nodule which abuts the major fissure, image 96/5. On 08/31/2022 this measured the same. Musculoskeletal: No chest wall mass or suspicious bone lesions identified. CT ABDOMEN AND PELVIS FINDINGS Hepatobiliary: Within the posterior 7 right lobe of liver there is a 1.5 cm low-density structure, image 83/2. This was characterized as a benign cyst on the MRI from 09/01/2022. No new focal liver lesions. Mild  gallbladder scratch set gallbladder appears normal. Pneumobilia is identified. Common bile duct stent is in place. Pneumobilia is identified compatible with biliary patency. There is persistent mild intrahepatic bile duct dilatation which is improved compared with 09/01/2022. Pancreas: The small lesion within the head of pancreas is suboptimally visualized on the current exam. As noted on the previous MRI this is difficult to precisely delineate in therefore accurately compare with previous imaging for interval growth. Best estimate is that this measures approximately 3.6 x 2.5 by 2.5 cm, image 156/4 and image 44/6. Compared with the previous exam this appears similar in size (when remeasured). Spleen: Normal in size without focal abnormality. Adrenals/Urinary Tract: Normal adrenal glands. No nephrolithiasis or obstructive uropathy. Previous Bosniak class 2 cyst off the posterior cortex of the left kidney is unchanged measuring 3.2 cm. No follow-up imaging recommended. Urinary bladder appears within normal limits. Stomach/Bowel: Stomach is nondistended. There is no bowel wall thickening, inflammation or distension. Moderate stool burden is identified throughout the colon. Vascular/Lymphatic: Aortic atherosclerosis. The upper abdominal vascularity remains patent. As before, pancreatic head lesion extends up to and abuts the superior mesenteric vein, image 104/2. Small extension of soft tissue extends up to, and contacts the superior mesenteric artery on image 108/2. No encasement of the celiac trunk. No enlarged lymph nodes identified. Reproductive: Prostate is unremarkable. Other: No ascites or focal fluid collections. No signs of peritoneal nodularity. Musculoskeletal: No acute or significant osseous findings. Lumbar spondylosis noted. IMPRESSION: 1. The small lesion within the head of pancreas is suboptimally visualized on the current exam. As noted on the previous MRI this is difficult to precisely delineate and  therefore accurately compare with previous imaging for interval growth. Best estimate is that this measures approximately 3.6 x 2.5 x 2.5 cm. Compared with the previous exam this appears similar in size (when remeasured). As mentioned previously the tumor appears to contact (but does not encase) the superior mesenteric vein near the portal venous confluence. There is also a small finger-like a prior projection (from the mass) of soft tissue extending up to and potentially contacting the superior mesenteric artery without encasement. 2. No signs of metastatic disease within the abdomen or pelvis. 3. There is a 1.2 cm non-solid nodule within the posterolateral right lower lobe. 4 mm internal solid component is suspected. This is indeterminate. Follow-up non-contrast CT recommended at 3-6 months to confirm persistence. 4. Common bile duct stent is in place. Pneumobilia is identified compatible with biliary patency. There is persistent mild intrahepatic bile duct dilatation which is improved compared with 09/01/2022. 5. Coronary artery calcifications. 6. Within the anterior right lower lobe there is a 5 mm solid nodule which  abuts the major fissure. This appears unchanged from 08/31/2022. Nonspecific. Attention on follow-up imaging advised. 7.  Aortic Atherosclerosis (ICD10-I70.0). Electronically Signed: By: Signa Kell M.D. On: 10/04/2022 17:22   DG ERCP  Result Date: 10/13/2022 CLINICAL DATA:  ERCP.  History of pancreatic cancer EXAM: ERCP TECHNIQUE: Multiple spot images obtained with the fluoroscopic device and submitted for interpretation post-procedure. FLUOROSCOPY TIME: FLUOROSCOPY TIME 2 minutes, 39 seconds (32.8 mGy) COMPARISON:  ERCP-09/02/2022 FINDINGS: Ten spot fluoroscopic images of the right upper abdominal quadrant during ERCP are provided for review Initial image demonstrates an ERCP probe and pre-existing plastic biliary stent overlying the right upper abdominal quadrant. Subsequent images  demonstrate removal of the pre-existing plastic stent with selective cannulation and opacification of the common bile duct which appears markedly dilated with malignant shouldering/subtotal occlusion at its distal aspect. Completion image demonstrates placement of a metallic biliary stent traversing the distal aspect of the CBD. IMPRESSION: ERCP with biliary stent exchange as above. These images were submitted for radiologic interpretation only. Please see the procedural report for the amount of contrast and the fluoroscopy time utilized. Electronically Signed   By: Simonne Come M.D.   On: 10/13/2022 17:15   CT Angio Chest/Abd/Pel for Dissection W and/or Wo Contrast  Result Date: 10/13/2022 CLINICAL DATA:  Acute aortic syndrome, upper abdominal pain, upper back pain. Pancreatic cancer, leukocytosis. EXAM: CT ANGIOGRAPHY CHEST, ABDOMEN AND PELVIS TECHNIQUE: Non-contrast CT of the chest was initially obtained. Multidetector CT imaging through the chest, abdomen and pelvis was performed using the standard protocol during bolus administration of intravenous contrast. Multiplanar reconstructed images and MIPs were obtained and reviewed to evaluate the vascular anatomy. RADIATION DOSE REDUCTION: This exam was performed according to the departmental dose-optimization program which includes automated exposure control, adjustment of the mA and/or kV according to patient size and/or use of iterative reconstruction technique. CONTRAST:  OMNIPAQUE IOHEXOL 350 MG/ML SOLN COMPARISON:  None Available. FINDINGS: CTA CHEST FINDINGS Cardiovascular: The thoracic aorta is normal in course and caliber. No intramural hematoma, dissection, or aneurysm. Mild atherosclerotic calcification. Arch vasculature demonstrates classic anatomic configuration and is widely patent proximally. Mild aortic valvular calcification. No significant coronary artery calcification. Global cardiac size is within normal limits. No pericardial effusion.  There is adequate opacification of the pulmonary arterial tree no intraluminal filling defect is identified through the segmental level to suggest acute pulmonary embolism. Central pulmonary arteries are of normal caliber. Mediastinum/Nodes: No enlarged mediastinal, hilar, or axillary lymph nodes. Thyroid gland, trachea, and esophagus demonstrate no significant findings. Lungs/Pleura: 10 mm sub solid pulmonary nodule is seen within the right lower lobe, axial image # 72/6, stable since prior examination. 5 mm noncalcified subpleural pulmonary nodule within the right lower lobe, axial image # 94/6, stable since prior examination. Lungs are otherwise clear. No confluent pulmonary infiltrate. No pneumothorax or pleural effusion. Central airways are widely patent. Musculoskeletal: Osseous structures are age-appropriate. No acute bone abnormality. Review of the MIP images confirms the above findings. CTA ABDOMEN AND PELVIS FINDINGS VASCULAR Aorta: Normal caliber aorta without aneurysm, dissection, vasculitis or significant stenosis. Moderate atherosclerotic calcification Celiac: Patent without evidence of aneurysm, dissection, vasculitis or significant stenosis. SMA: Patent without evidence of aneurysm, dissection, vasculitis or significant stenosis. Renals: Both renal arteries are patent without evidence of aneurysm, dissection, vasculitis, fibromuscular dysplasia or significant stenosis. IMA: Patent without evidence of aneurysm, dissection, vasculitis or significant stenosis. Inflow: Patent without evidence of aneurysm, dissection, vasculitis or significant stenosis. Veins: No obvious venous abnormality within the limitations of  this arterial phase study. Review of the MIP images confirms the above findings. NON-VASCULAR Hepatobiliary: Internal biliary stent is again seen extending from the mid common duct to the third portion of the duodenum. However, since the prior examination, there has developed progressive  gallbladder and biliary distension and near complete resolution of pneumobilia suggesting occlusion of the stent. No enhancing intrahepatic mass. The gallbladder is otherwise unremarkable. Pancreas: The main pancreatic duct is markedly dilated throughout its course and there is marked atrophy of the pancreas, similar to prior MRI examination of 09/01/2022 with a poorly defined mass within the pancreatic head better demonstrated on prior examination. Spleen: Unremarkable Adrenals/Urinary Tract: The adrenal glands are unremarkable. The kidneys are normal in size and position. Hemorrhagic cyst noted within the posterior interpolar region of the left kidney better demonstrated on prior MRI examination 09/01/2022. The kidneys are otherwise unremarkable. Bladder unremarkable. Stomach/Bowel: Mild-to-moderate distal colonic diverticulosis, most severe within the sigmoid colon. The stomach, small bowel, and large bowel are otherwise unremarkable. No evidence of obstruction or focal inflammation. Appendix normal. No free intraperitoneal gas or fluid. Lymphatic: No pathologic adenopathy within the abdomen and pelvis. Shotty peripancreatic adenopathy with adjacent to the head of the pancreas within the aortocaval region is nonspecific,. Reproductive: Prostate is unremarkable. Other: Small fat containing right inguinal hernia Musculoskeletal: Degenerative changes seen within the lumbar spine. No acute bone abnormality. No lytic or blastic bone lesion. Review of the MIP images confirms the above findings. IMPRESSION: 1. No evidence of thoracoabdominal aortic aneurysm or dissection. 2. No pulmonary embolism. 3. Progressive gallbladder and biliary distension and near complete resolution of pneumobilia suggesting occlusion of the internal biliary stent. Correlation with liver enzymes and possible hepatobiliary scintigraphy would be helpful to confirm an obstructive process. 4. Poorly defined mass within the pancreatic head better  demonstrated on prior MRI examination of 09/01/2022 with associated marked dilation of the main pancreatic duct and marked atrophy of the pancreas, similar to prior examination. 5. Stable right lower lobe pulmonary nodules, indeterminate. Close attention on subsequent surveillance/restaging examination is warranted. If none are planned, a follow-up evaluation in 6 months is recommended for further evaluation. 6. Mild-to-moderate distal colonic diverticulosis without superimposed acute inflammatory change. Aortic Atherosclerosis (ICD10-I70.0). Electronically Signed   By: Helyn Numbers M.D.   On: 10/13/2022 02:08    Labs:  CBC: Recent Labs    10/09/22 1443 10/12/22 2344 10/14/22 0917 10/23/22 0753  WBC 8.2 20.0* 17.7* 9.9  HGB 13.5 13.4 12.2* 14.3  HCT 39.7 39.3 35.9* 42.1  PLT 249 243 218 313    COAGS: Recent Labs    08/31/22 2330 10/13/22 1855  INR 1.2 1.3*    BMP: Recent Labs    10/09/22 1443 10/12/22 2344 10/14/22 0917 10/23/22 0753  NA 134* 134* 135 138  K 3.9 3.5 4.1 4.4  CL 99 102 102 101  CO2 28 24 26 29   GLUCOSE 403* 234* 178* 245*  BUN 19 23 21  25*  CALCIUM 8.8* 8.7* 8.9 9.3  CREATININE 1.08 1.02 1.16 1.25*  GFRNONAA >60 >60 >60 57*    LIVER FUNCTION TESTS: Recent Labs    10/09/22 1443 10/12/22 2344 10/14/22 0917 10/23/22 0753  BILITOT 1.6* 4.6* 4.0* 1.5*  AST 17 100* 64* 16  ALT 35 100* 93* 26  ALKPHOS 442* 457* 334* 214*  PROT 6.3* 6.6 5.8* 6.7  ALBUMIN 3.3* 3.4* 2.6* 4.0    TUMOR MARKERS: No results for input(s): "AFPTM", "CEA", "CA199", "CHROMGRNA" in the last 8760 hours.  Assessment  and Plan:  Pancreatic cancer with pending neoadjuvant chemotherapy: Alexander [name], 82 year old male, presents today to the [facility] Interventional Radiology department for an image-guided port-a-catheter placement.   Risks and benefits of image-guided Port-a-catheter placement were discussed with the patient including, but not limited to bleeding,  infection, pneumothorax, or fibrin sheath development and need for additional procedures.  All of the patient's questions were answered, patient is agreeable to proceed. He has been NPO. He is a full code.   Consent signed and in chart.   Thank you for this interesting consult.  I greatly enjoyed meeting Alexander [name] and look forward to participating in their care.  A copy of this report was sent to the requesting provider on this date.  Electronically Signed: Alwyn Ren, AGACNP-BC [phone] 10/27/2022, 10:41 AM   I spent a total of  30 Minutes   in face to face in clinical consultation, greater than 50% of which was counseling/coordinating care for port-a-catheter placement.

## 2022-10-31 ENCOUNTER — Other Ambulatory Visit: Payer: Self-pay | Admitting: *Deleted

## 2022-10-31 ENCOUNTER — Inpatient Hospital Stay: Payer: Medicare HMO

## 2022-10-31 ENCOUNTER — Encounter: Payer: Self-pay | Admitting: *Deleted

## 2022-10-31 VITALS — BP 118/60 | HR 74 | Resp 16

## 2022-10-31 DIAGNOSIS — C252 Malignant neoplasm of tail of pancreas: Secondary | ICD-10-CM

## 2022-10-31 DIAGNOSIS — Z5111 Encounter for antineoplastic chemotherapy: Secondary | ICD-10-CM | POA: Diagnosis not present

## 2022-10-31 LAB — CBC WITH DIFFERENTIAL (CANCER CENTER ONLY)
Abs Immature Granulocytes: 0.06 10*3/uL (ref 0.00–0.07)
Basophils Absolute: 0.1 10*3/uL (ref 0.0–0.1)
Basophils Relative: 1 %
Eosinophils Absolute: 0.1 10*3/uL (ref 0.0–0.5)
Eosinophils Relative: 1 %
HCT: 37.2 % — ABNORMAL LOW (ref 39.0–52.0)
Hemoglobin: 12.5 g/dL — ABNORMAL LOW (ref 13.0–17.0)
Immature Granulocytes: 1 %
Lymphocytes Relative: 13 %
Lymphs Abs: 1.2 10*3/uL (ref 0.7–4.0)
MCH: 32.1 pg (ref 26.0–34.0)
MCHC: 33.6 g/dL (ref 30.0–36.0)
MCV: 95.4 fL (ref 80.0–100.0)
Monocytes Absolute: 1.1 10*3/uL — ABNORMAL HIGH (ref 0.1–1.0)
Monocytes Relative: 11 %
Neutro Abs: 7.2 10*3/uL (ref 1.7–7.7)
Neutrophils Relative %: 73 %
Platelet Count: 257 10*3/uL (ref 150–400)
RBC: 3.9 MIL/uL — ABNORMAL LOW (ref 4.22–5.81)
RDW: 13.4 % (ref 11.5–15.5)
WBC Count: 9.7 10*3/uL (ref 4.0–10.5)
nRBC: 0 % (ref 0.0–0.2)

## 2022-10-31 LAB — CMP (CANCER CENTER ONLY)
ALT: 16 U/L (ref 0–44)
AST: 15 U/L (ref 15–41)
Albumin: 3.6 g/dL (ref 3.5–5.0)
Alkaline Phosphatase: 143 U/L — ABNORMAL HIGH (ref 38–126)
Anion gap: 8 (ref 5–15)
BUN: 22 mg/dL (ref 8–23)
CO2: 25 mmol/L (ref 22–32)
Calcium: 8.8 mg/dL — ABNORMAL LOW (ref 8.9–10.3)
Chloride: 103 mmol/L (ref 98–111)
Creatinine: 1.03 mg/dL (ref 0.61–1.24)
GFR, Estimated: >60 mL/min (ref >60)
Glucose, Bld: 370 mg/dL — ABNORMAL HIGH (ref 70–99)
Potassium: 4.5 mmol/L (ref 3.5–5.1)
Sodium: 136 mmol/L (ref 135–145)
Total Bilirubin: 1.1 mg/dL (ref 0.3–1.2)
Total Protein: 6.3 g/dL — ABNORMAL LOW (ref 6.5–8.1)

## 2022-10-31 MED ORDER — LOPERAMIDE HCL 2 MG PO CAPS: ORAL_CAPSULE | ORAL | 3 refills | Status: DC

## 2022-10-31 MED ORDER — DEXAMETHASONE SODIUM PHOSPHATE 100 MG/10ML IJ SOLN: 10.0000 mg | Freq: Once | INTRAMUSCULAR | Status: DC

## 2022-10-31 MED ORDER — DEXAMETHASONE SODIUM PHOSPHATE 100 MG/10ML IJ SOLN
10.0000 mg | Freq: Once | INTRAMUSCULAR | Status: DC
  Filled 2022-10-31: qty 1

## 2022-10-31 MED ORDER — HEPARIN SOD (PORK) LOCK FLUSH 100 UNIT/ML IV SOLN
500.0000 [IU] | Freq: Once | INTRAVENOUS | Status: DC | PRN
Start: 2022-10-31 — End: 2022-10-31

## 2022-10-31 MED ORDER — SODIUM CHLORIDE 0.9% FLUSH: 10.0000 mL | INTRAVENOUS | Status: DC | PRN

## 2022-10-31 MED ORDER — SODIUM CHLORIDE 0.9 % IV SOLN
1920.0000 mg/m2 | INTRAVENOUS | Status: DC
  Administered 2022-10-31: 3500 mg via INTRAVENOUS
  Filled 2022-10-31: qty 70

## 2022-10-31 MED ORDER — DEXAMETHASONE SODIUM PHOSPHATE 10 MG/ML IJ SOLN
10.0000 mg | Freq: Once | INTRAMUSCULAR | Status: AC
  Administered 2022-10-31: 10 mg via INTRAVENOUS
  Filled 2022-10-31: qty 1

## 2022-10-31 MED ORDER — ONDANSETRON HCL 8 MG PO TABS: 8.0000 mg | ORAL_TABLET | Freq: Three times a day (TID) | ORAL | 1 refills | Status: DC | PRN

## 2022-10-31 MED ORDER — SODIUM CHLORIDE 0.9 % IV SOLN
150.0000 mg | Freq: Once | INTRAVENOUS | Status: AC
  Administered 2022-10-31: 150 mg via INTRAVENOUS
  Filled 2022-10-31: qty 150

## 2022-10-31 MED ORDER — SODIUM CHLORIDE 0.9 % IV SOLN
120.0000 mg/m2 | Freq: Once | INTRAVENOUS | Status: AC
  Administered 2022-10-31: 200 mg via INTRAVENOUS
  Filled 2022-10-31: qty 10

## 2022-10-31 MED ORDER — OXALIPLATIN CHEMO INJECTION 100 MG/20ML
68.0000 mg/m2 | Freq: Once | INTRAVENOUS | Status: AC
  Administered 2022-10-31: 120 mg via INTRAVENOUS
  Filled 2022-10-31: qty 20

## 2022-10-31 MED ORDER — SODIUM CHLORIDE 0.9 % IV SOLN
400.0000 mg/m2 | Freq: Once | INTRAVENOUS | Status: AC
  Administered 2022-10-31: 692 mg via INTRAVENOUS
  Filled 2022-10-31: qty 34.6

## 2022-10-31 MED ORDER — ATROPINE SULFATE 1 MG/ML IV SOLN
0.5000 mg | Freq: Once | INTRAVENOUS | Status: AC | PRN
  Administered 2022-10-31: 0.5 mg via INTRAVENOUS
  Filled 2022-10-31: qty 1

## 2022-10-31 MED ORDER — DEXTROSE 5 % IV SOLN
INTRAVENOUS | Status: DC
Start: 2022-10-31 — End: 2022-10-31

## 2022-10-31 MED ORDER — PALONOSETRON HCL INJECTION 0.25 MG/5ML
0.2500 mg | Freq: Once | INTRAVENOUS | Status: AC
  Administered 2022-10-31: 0.25 mg via INTRAVENOUS
  Filled 2022-10-31: qty 5

## 2022-10-31 MED ORDER — DEXAMETHASONE 4 MG PO TABS: 8.0000 mg | ORAL_TABLET | Freq: Every day | ORAL | 1 refills | Status: DC

## 2022-10-31 MED ORDER — PROCHLORPERAZINE MALEATE 10 MG PO TABS: 10.0000 mg | ORAL_TABLET | Freq: Four times a day (QID) | ORAL | 1 refills | Status: DC | PRN

## 2022-10-31 NOTE — Progress Notes (Signed)
Ok to treat today with labs dated 10.21.24 per Dr Myna Hidalgo.  No take home Dexamethasone per Dr Myna Hidalgo because of Patients diabetes.

## 2022-10-31 NOTE — Patient Instructions (Signed)
[facility]  Discharge Instructions: Thank you for choosing [facility] to provide your oncology and hematology care.   If you have a lab appointment with the Cancer Center, please go directly to the Cancer Center and check in at the registration area.  Wear comfortable clothing and clothing appropriate for easy access to any Portacath or PICC line.   We strive to give you quality time with your provider. You may need to reschedule your appointment if you arrive late (15 or more minutes).  Arriving late affects you and other patients whose appointments are after yours.  Also, if you miss three or more appointments without notifying the office, you may be dismissed from the clinic at the provider's discretion.      For prescription refill requests, have your pharmacy contact our office and allow 72 hours for refills to be completed.    Today you received the following chemotherapy and/or immunotherapy agents Oxaliplatin, Camptosar, 5FU      To help prevent nausea and vomiting after your treatment, we encourage you to take your nausea medication as directed.  BELOW ARE SYMPTOMS THAT SHOULD BE REPORTED IMMEDIATELY: *FEVER GREATER THAN 100.4 F (38 C) OR HIGHER *CHILLS OR SWEATING *NAUSEA AND VOMITING THAT IS NOT CONTROLLED WITH YOUR NAUSEA MEDICATION *UNUSUAL SHORTNESS OF BREATH *UNUSUAL BRUISING OR BLEEDING *URINARY PROBLEMS (pain or burning when urinating, or frequent urination) *BOWEL PROBLEMS (unusual diarrhea, constipation, pain near the anus) TENDERNESS IN MOUTH AND THROAT WITH OR WITHOUT PRESENCE OF ULCERS (sore throat, sores in mouth, or a toothache) UNUSUAL RASH, SWELLING OR PAIN  UNUSUAL VAGINAL DISCHARGE OR ITCHING   Items with * indicate a potential emergency and should be followed up as soon as possible or go to the Emergency Department if any problems should occur.  Please show the CHEMOTHERAPY ALERT CARD or IMMUNOTHERAPY  ALERT CARD at check-in to the Emergency Department and triage nurse. Should you have questions after your visit or need to cancel or reschedule your appointment, please contact [facility]  [phone] and follow the prompts.  Office hours are 8:00 a.m. to 4:30 p.m. Monday - Friday. Please note that voicemails left after 4:00 p.m. may not be returned until the following business day.  We are closed weekends and major holidays. You have access to a nurse at all times for urgent questions. Please call the main number to the clinic [phone] and follow the prompts.  For any non-urgent questions, you may also contact your provider using MyChart. We now offer e-Visits for anyone 59 and older to request care online for non-urgent symptoms. For details visit [email or web address].   Also download the MyChart app! Go to the app store, search "MyChart", open the app, select [facility], and log in with your MyChart username and password.

## 2022-10-31 NOTE — Progress Notes (Signed)
Patient is here to start treatment. He completed education last week, and had his port placed yesterday.   Per previous discussion, patient wishes to proceed with genetic testing. [employer] kit is drawn and shipped today. Genetic referral placed.   Oncology Nurse Navigator Documentation     10/31/2022   10:15 AM  Oncology Nurse Navigator Flowsheets  Planned Course of Treatment Neo Chemo  Phase of Treatment Chemo  Chemotherapy Actual Start Date: 10/31/2022  Chemotherapy Expected End Date: 12/13/2022  Navigator Follow Up Date: 11/14/2022  Navigator Follow Up Reason: Follow-up Appointment;Chemotherapy  Navigator Location [facility]  Navigator Encounter Type Treatment  Treatment Initiated Date 10/31/2022  Patient Visit Type MedOnc  Treatment Phase First Chemo Tx  Barriers/Navigation Needs Coordination of Care;Education  Education Other  Interventions Education;Referrals  Acuity Level 2-Minimal Needs (1-2 Barriers Identified)  Referrals Genetics  Education Method Verbal;Written  Support Groups/Services Friends and Family  Time Spent with Patient 30  Genetic Counseling Type Non-Urgent

## 2022-10-31 NOTE — Telephone Encounter (Signed)
Spoke to patient to follow up on his his blood sugars were responding with his new regimen of Lantus 25 units daily and Humulin R 5-7 units TID. He reports that over the weekend his blood sugars were " much better". The highest he saw was 370. He did not have any lows and most were within normal range. We will keep him on his current regimen. He was advised that he can increase Humulin R to 10 units TID if needed  Will follow up up with him towards the end of the week

## 2022-10-31 NOTE — Patient Instructions (Signed)

## 2022-11-01 ENCOUNTER — Other Ambulatory Visit: Payer: Self-pay

## 2022-11-01 ENCOUNTER — Telehealth: Payer: Self-pay

## 2022-11-01 NOTE — Patient Outreach (Signed)
Care Management  Transitions of Care Program Transitions of Care Post-discharge week 3   11/01/2022 Name: Alexander [name] MRN: [id number] DOB: 1940/10/15  Subjective: Alexander [name] is a 82 y.o. year old male who is a primary care patient of Shirline Frees, NP. The Care Management team Engaged with patient Engaged with patient by telephone to assess and address transitions of care needs.   Consent to Services:  Patient was given information about care management services, agreed to services, and gave verbal consent to participate.   Assessment:   Most recent Hospital stay    1010-10/12 / 2024  Patient voices no new complaints Patient  developed/ elevated blood glucoses readings s/p Chemo and Steroid injections. His BG has been running above 400. He reports excessive thirst and urination He spoke with PCP and He increased Lantus Insulin to 20 Units and Humulin10 Units w/ meals beginning today . He has an appt pending with a Nutritionist. He reports he has an appetite although food has no taste, he denies any changes in moth no thrush on tongue or throat. He does report some increased sensitivity to cold temperatures (known SE of Chemo) His pump is removed tomorrow and he is on a 2 week treatment schedule  His granddaughter has  moved in and is assisting with daily care, errands   He is in good spirits, is talkative and optimistic   Patient educated on red flags s/s to watch for and was encouraged to report any of these identified , any new symptoms , changes in baseline or  medication regimen,  change in health status  /  well-being, or safety concerns to PCP and / or the  [facility] Case Management team .          SDOH Interventions    Flowsheet Row Clinical Support from 09/09/2021 in [facility] at [place] Clinical Support from 09/08/2020 in [facility] [place] [facility] at [place] Clinical Support from 09/26/2019 in [facility] at [place]  Chronic Care Management from 07/03/2019 in [facility] [place] [facility] at [place]  SDOH Interventions      Food Insecurity Interventions Intervention Not Indicated Intervention Not Indicated Intervention Not Indicated --  Housing Interventions Intervention Not Indicated Intervention Not Indicated Intervention Not Indicated --  Transportation Interventions Intervention Not Indicated Intervention Not Indicated -- Intervention Not Indicated  Utilities Interventions Intervention Not Indicated -- -- --  Alcohol Usage Interventions Intervention Not Indicated (Score <7) -- -- --  Depression Interventions/Treatment  -- -- PHQ2-9 Score <4 Follow-up Not Indicated --  Financial Strain Interventions Intervention Not Indicated Intervention Not Indicated -- Intervention Not Indicated  Physical Activity Interventions Intervention Not Indicated Intervention Not Indicated Intervention Not Indicated --  Stress Interventions Intervention Not Indicated Intervention Not Indicated Intervention Not Indicated --  Social Connections Interventions Intervention Not Indicated Intervention Not Indicated Intervention Not Indicated --        Goals Addressed             This Visit's Progress    TOC Care Plan       Current Barriers:  Care Coordination s/p hospital discharge for on-going medical conditions and s/s management    RNCM Clinical Goal(s):  Patient will work with the Care Management team over the next 30 days to address Transition of Care Barriers: Medication Management Provider appointments take all medications exactly as prescribed and will call provider for medication related questions as evidenced by and change in s/s BG not controlled with medication or  interventions  attend all scheduled medical appointments: with PCP and oncologist  as evidenced by no missed appt no unplanned ER visits  demonstrate ongoing self health care management ability notifying PCP and Specialists of ant change in status   as evidenced by no unmanaged or adverse side effects and acute changes will be managed   through collaboration with RN Care manager, provider, and care team.   Interventions: Evaluation of current treatment plan related to  self management and patient's adherence to plan as established by provider  Transitions of Care:  Goal on track:  Yes. Doctor Visits  - discussed the importance of doctor visits  Patient Goals/Self-Care Activities: Participate in Transition of Care Program/Attend [facility] scheduled calls Take all medications as prescribed Attend all scheduled provider appointments Call pharmacy for medication refills 3-7 days in advance of running out of medications Perform all self care activities independently  Perform IADL's (shopping, preparing meals, housekeeping, managing finances) independently Call provider office for new concerns or questions   Follow Up Plan:  Telephone follow up appointment with care management team member scheduled for:  11/08/22 @ 11:00am  The patient has been provided with contact information for the care management team and has been advised to call with any health related questions or concerns.          Plan: The patient has been provided with contact information for the care management team and has been advised to call with any health related questions or concerns.  Routine follow-up and on-going assessment evaluation and education of disease processes, recommended interventions for both chronic and acute medical conditions , will occur during each weekly visit along with ongoing review of symptoms ,medication reviews and reconciliation. Any updates , inconsistencies, discrepancies or acute care concerns will be addressed and routed to the correct Practitioner if indicated   Please refer to Care Plan for goals and interventions -Effectiveness of interventions, symptom management and outcomes will be evaluated  weekly during [facility] 30-day Program Outreach calls  .  Any necessary  changes and updates to Care Plan will be completed episodically    Reviewed goals for care  Patient provided with Contact information and verbalized understanding with current POC.  Susa Loffler , BSN, RN Care Management Coordinator [place]   [facility] [email or web address] Direct Dial: [phone]

## 2022-11-01 NOTE — Telephone Encounter (Signed)
Scheduled appointments per referral. Patient is aware of the appointment time and date as well as the address. Patient was informed to arrive 10-15 minutes prior with updated insurance information. All questions were answered.

## 2022-11-02 ENCOUNTER — Telehealth: Payer: Self-pay | Admitting: Dietician

## 2022-11-02 ENCOUNTER — Encounter: Payer: Medicare HMO | Admitting: Dietician

## 2022-11-02 ENCOUNTER — Inpatient Hospital Stay: Payer: Medicare HMO

## 2022-11-02 VITALS — BP 100/87 | HR 59 | Temp 97.9°F | Resp 17

## 2022-11-02 DIAGNOSIS — C252 Malignant neoplasm of tail of pancreas: Secondary | ICD-10-CM

## 2022-11-02 DIAGNOSIS — Z5111 Encounter for antineoplastic chemotherapy: Secondary | ICD-10-CM | POA: Diagnosis not present

## 2022-11-02 MED ORDER — SODIUM CHLORIDE 0.9% FLUSH
10.0000 mL | INTRAVENOUS | Status: DC | PRN
  Administered 2022-11-02: 10 mL

## 2022-11-02 MED ORDER — HEPARIN SOD (PORK) LOCK FLUSH 100 UNIT/ML IV SOLN
500.0000 [IU] | Freq: Once | INTRAVENOUS | Status: AC | PRN
  Administered 2022-11-02: 500 [IU]

## 2022-11-02 NOTE — Telephone Encounter (Signed)
Attempted to reach patient for a scheduled remote nutrition consult. We had just started our chat for his nutrition consult while he was riding in car.  Coverage was sketchy and we kept getting disconnected. Sent text with contact info to have him call later or we could reschedule Tuesday.  Gennaro Africa, RDN, LDN Registered Dietitian, [facility] Part Time Remote (Usual office hours: Tuesday-Thursday) Cell: [phone]

## 2022-11-08 ENCOUNTER — Encounter: Payer: Self-pay | Admitting: Genetic Counselor

## 2022-11-08 ENCOUNTER — Inpatient Hospital Stay: Payer: Medicare HMO | Attending: Hematology & Oncology | Admitting: Dietician

## 2022-11-08 ENCOUNTER — Other Ambulatory Visit: Payer: Self-pay

## 2022-11-08 ENCOUNTER — Encounter: Payer: Self-pay | Admitting: *Deleted

## 2022-11-08 DIAGNOSIS — Z79899 Other long term (current) drug therapy: Secondary | ICD-10-CM | POA: Insufficient documentation

## 2022-11-08 DIAGNOSIS — C259 Malignant neoplasm of pancreas, unspecified: Secondary | ICD-10-CM | POA: Insufficient documentation

## 2022-11-08 DIAGNOSIS — Z794 Long term (current) use of insulin: Secondary | ICD-10-CM | POA: Insufficient documentation

## 2022-11-08 DIAGNOSIS — Z452 Encounter for adjustment and management of vascular access device: Secondary | ICD-10-CM | POA: Insufficient documentation

## 2022-11-08 DIAGNOSIS — Z1379 Encounter for other screening for genetic and chromosomal anomalies: Secondary | ICD-10-CM | POA: Insufficient documentation

## 2022-11-08 DIAGNOSIS — Z5111 Encounter for antineoplastic chemotherapy: Secondary | ICD-10-CM | POA: Insufficient documentation

## 2022-11-08 DIAGNOSIS — E119 Type 2 diabetes mellitus without complications: Secondary | ICD-10-CM | POA: Insufficient documentation

## 2022-11-08 NOTE — Patient Outreach (Signed)
Care Management  Transitions of Care Program Transitions of Care Post-discharge week 4   11/08/2022 Name: Alexander [name] MRN: [id number] DOB: July 30, 1940  Subjective: Alexander [name] is a 82 y.o. year old male who is a primary care patient of Shirline Frees, NP. The Care Management team Engaged with patient Engaged with patient by telephone to assess and address transitions of care needs.   Consent to Services:  Patient was given information about care management services, agreed to services, and gave verbal consent to participate.   Assessment:     Patient voices no new complaints Patient has not developed/ reported any new Medical issues / Dx or acute changes.- since last follow-up call for most recent  Hospital stay 10/11-10/12 / 2024  He is generally doing well Current Chemo treatment plan is 1 q 2 weeks Next Tx scheduled for 11/12 with pump stop 11/14. Overall he is doing well, no increased sensitivity to cold temperatures. The biggest concern and adverse side effect he  is reporting is that food, drinks have a terrible unappetizing taste, He has tried various textures , temperatures without success. He is lactose intolerant, he reports he does  not like jello or gelatin consistency substances. His BG tends to run hight but the has found the most tolerable items tend to be" sugary " He was scheduled to see a Nutritionist, it appears they had an unsuccessful call.Call placed to [facility] ( [phone]) requested she follow-up with him to discus option he may be able to tolerate provided contact info for patient and myself.  Patient educated on red flags s/s to watch for and was encouraged to report any of these identified , any new symptoms , changes in baseline or  medication regimen,  change in health status  /  well-being, or safety concerns to PCP and / or the  [facility] Case Management team .        SDOH Interventions    Flowsheet Row Patient Outreach from 11/08/2022 in [facility] Clinical Support from 09/09/2021 in [facility] [place] [facility] at [place] Clinical Support from 09/08/2020 in [facility] [place] [facility] at [place] Clinical Support from 09/26/2019 in [facility] [place] [facility] at [place] Chronic Care Management from 07/03/2019 in [facility] [facility] at [place]  SDOH Interventions       Food Insecurity Interventions Intervention Not Indicated Intervention Not Indicated Intervention Not Indicated Intervention Not Indicated --  Housing Interventions Intervention Not Indicated Intervention Not Indicated Intervention Not Indicated Intervention Not Indicated --  Transportation Interventions Intervention Not Indicated, Patient Resources (Friends/Family) Intervention Not Indicated Intervention Not Indicated -- Intervention Not Indicated  Utilities Interventions Intervention Not Indicated Intervention Not Indicated -- -- --  Alcohol Usage Interventions -- Intervention Not Indicated (Score <7) -- -- --  Depression Interventions/Treatment  -- -- -- PHQ2-9 Score <4 Follow-up Not Indicated --  Financial Strain Interventions -- Intervention Not Indicated Intervention Not Indicated -- Intervention Not Indicated  Physical Activity Interventions -- Intervention Not Indicated Intervention Not Indicated Intervention Not Indicated --  Stress Interventions -- Intervention Not Indicated Intervention Not Indicated Intervention Not Indicated --  Social Connections Interventions -- Intervention Not Indicated Intervention Not Indicated Intervention Not Indicated --        Goals Addressed             This Visit's Progress    TOC Care Plan       Current Barriers:  Care Coordination s/p hospital discharge for on-going medical conditions and  s/s management    RNCM Clinical Goal(s):  Patient will work with the Care Management team over the next 30 days to address Transition of Care Barriers: Medication  Management Diet/Nutrition/Food Resources Provider appointments take all medications exactly as prescribed and will call provider for medication related questions as evidenced by and change in s/s BG not controlled with medication or interventions  attend all scheduled medical appointments: with PCP and oncologist  as evidenced by no missed appt no unplanned ER visits  demonstrate ongoing self health care management ability notifying PCP and Specialists of ant change in status  as evidenced by no unmanaged or adverse side effects and acute changes will be managed  Collaborate with Nutritionist on meal options to ensure he has adequate intake   through collaboration with RN Care manager, provider, and care team.   Interventions: Evaluation of current treatment plan related to  self management and patient's adherence to plan as established by provider  Transitions of Care:  Goal on track:  Yes. Doctor Visits  - discussed the importance of doctor visits  Patient Goals/Self-Care Activities: Participate in Transition of Care Program/Attend [facility] scheduled calls Take all medications as prescribed Attend all scheduled provider appointments Call pharmacy for medication refills 3-7 days in advance of running out of medications Perform all self care activities independently  Perform IADL's (shopping, preparing meals, housekeeping, managing finances) independently Call provider office for new concerns or questions   Follow Up Plan:  Telephone follow up appointment with care management team member scheduled for:  11/17/22 @ 10:00am  The patient has been provided with contact information for the care management team and has been advised to call with any health related questions or concerns.          Plan: The patient has been provided with contact information for the care management team and has been advised to call with any health related questions or concerns.  Routine follow-up and on-going  assessment evaluation and education of disease processes, recommended interventions for both chronic and acute medical conditions , will occur during each weekly visit along with ongoing review of symptoms ,medication reviews and reconciliation. Any updates , inconsistencies, discrepancies or acute care concerns will be addressed and routed to the correct Practitioner if indicated   Please refer to Care Plan for goals and interventions -Effectiveness of interventions, symptom management and outcomes will be evaluated  weekly during [facility] 30-day Program Outreach calls  . Any necessary  changes and updates to Care Plan will be completed episodically    Reviewed goals for care  Patient provided with Contact information and verbalized understanding with current POC.  Awaiting call back from Nutritionist regarding patient and options for adequate intake  Susa Loffler , BSN, RN Care Management Coordinator [place]   [facility] [email or web address] Direct Dial: [phone]

## 2022-11-08 NOTE — Progress Notes (Signed)
Nutrition Assessment: Reached out to patient at home telephone number.    Reason for Assessment: MST for weight loss and referral from CM   ASSESSMENT: Patient is a 82 y.o. year old male with localized adenocarcinoma of the pancreatic head. He was recently hospitalized with biliary obstruction and had a stent placed by Gastroenterology. He has been follow by Dr. Myna Hidalgo previously for polycythemia, also has PMHx that includes DM2, GERD, heart disease. CM relayed he has a daughter who is a physician and a grand daughter very involved with his care. He is undergoing FOLFIRINOX -- Neoadj - start cycle #1 on 10/31/2022.  He is  lactose, but not having issues with cold intolerance in mouth or throat (hands only),  Patient reports everything including water taste terrible and he can't get anything down.  He did tolerate eggs and bagel this morning.  He can't tolerate meats, anything acidic, many fruits.   At this point he can get down coffee with creamer, has 2 protein powder supplements, willing to trial smoothie with Almond milk and protein powder with peanut butter and banana or applesauce.  Would be willing to eat some pumpkin pie.  Offered suggestion of pumpkin in smoothie.  Loves pasta and pizza can't tolerate now too acidic. Offered suggestions for garlic and oil, or pasta salad alternative, also suggested salty options like soups (encouraged bean based).  Encouraged trial of Fair Life Milk, ricotta cheese, soy yogurts which are all lower in lactose. Loves pasta and pizza can't tolerate now sauce too acidic.  Nutrition Focused Physical Exam: unable to perform NFPE   Medications: creon, insulin, fast acting insulin all ordered   Labs: 10/31/22  Glucose 370   Anthropometrics:  weight loss 36# (21%) since beginning of year  Height: 71" Weight:  10/27/22  133# UBW: 170# BMI: 18.55   Estimated Energy Needs  Kcals: 2135-2400 Protein: 73-92g Fluid: 2.4   NUTRITION DIAGNOSIS: Inadequate PO  intake overall r/t dysgeusia to meet increased nutrient needs  INTERVENTION:   Relayed that nutrition services are wrap around service provided at no charge and encouraged continued communication if experiencing any nutritional impact symptoms (NIS). Educated on importance of adequate nourishment with calorie and protein energy intake with nutrient dense foods when possible to maintain weight/strength and QOL.   Suggested lactose free oral nutrition supplements as tolerated, consider smoothies or soupies with straw Offered suggestion of pumpkin in smoothie.   Offered suggestions for garlic and oil, or pasta salad alternative, also suggested salty options like soups (encouraged bean based).  Encouraged trial of Fair Life Milk, ricotta cheese, soy yogurts which are all lower in lactose. Emailed Nutrition Tip sheet  for  Nutrition During Cancer Treatment, High Protein Snacking, Soft moist protein foods Contact information provided.  MONITORING, EVALUATION, GOAL: weight trends, nutrition impact symptoms, PO intake, labs   Next Visit: Remote 2 weeks  Gennaro Africa, RDN, LDN Registered Dietitian, [facility] Part Time Remote (Usual office hours: Tuesday-Thursday) Mobile: [phone]

## 2022-11-08 NOTE — Telephone Encounter (Signed)
11/08/2022 - results forwarded to Cari and are in the [facility] portal.  Still needs to be put in S drive.

## 2022-11-09 ENCOUNTER — Encounter: Payer: Self-pay | Admitting: Adult Health

## 2022-11-10 ENCOUNTER — Encounter: Payer: Self-pay | Admitting: Hematology & Oncology

## 2022-11-10 ENCOUNTER — Encounter: Payer: Self-pay | Admitting: Adult Health

## 2022-11-14 ENCOUNTER — Other Ambulatory Visit: Payer: Self-pay | Admitting: Hematology & Oncology

## 2022-11-14 ENCOUNTER — Inpatient Hospital Stay: Payer: Medicare HMO

## 2022-11-14 ENCOUNTER — Encounter: Payer: Self-pay | Admitting: Hematology & Oncology

## 2022-11-14 ENCOUNTER — Encounter: Payer: Self-pay | Admitting: *Deleted

## 2022-11-14 ENCOUNTER — Inpatient Hospital Stay (HOSPITAL_BASED_OUTPATIENT_CLINIC_OR_DEPARTMENT_OTHER): Payer: Medicare HMO | Admitting: Hematology & Oncology

## 2022-11-14 VITALS — BP 134/67 | HR 68 | Temp 98.0°F | Wt 143.0 lb

## 2022-11-14 VITALS — BP 123/75 | HR 58 | Resp 18

## 2022-11-14 DIAGNOSIS — Z5111 Encounter for antineoplastic chemotherapy: Secondary | ICD-10-CM | POA: Diagnosis present

## 2022-11-14 DIAGNOSIS — C252 Malignant neoplasm of tail of pancreas: Secondary | ICD-10-CM

## 2022-11-14 DIAGNOSIS — Z794 Long term (current) use of insulin: Secondary | ICD-10-CM | POA: Diagnosis not present

## 2022-11-14 DIAGNOSIS — C251 Malignant neoplasm of body of pancreas: Secondary | ICD-10-CM

## 2022-11-14 DIAGNOSIS — E119 Type 2 diabetes mellitus without complications: Secondary | ICD-10-CM | POA: Diagnosis not present

## 2022-11-14 DIAGNOSIS — C259 Malignant neoplasm of pancreas, unspecified: Secondary | ICD-10-CM | POA: Diagnosis present

## 2022-11-14 DIAGNOSIS — Z452 Encounter for adjustment and management of vascular access device: Secondary | ICD-10-CM | POA: Diagnosis not present

## 2022-11-14 DIAGNOSIS — Z79899 Other long term (current) drug therapy: Secondary | ICD-10-CM | POA: Diagnosis not present

## 2022-11-14 LAB — CBC WITH DIFFERENTIAL (CANCER CENTER ONLY)
Abs Immature Granulocytes: 0.03 10*3/uL (ref 0.00–0.07)
Basophils Absolute: 0.1 10*3/uL (ref 0.0–0.1)
Basophils Relative: 1 %
Eosinophils Absolute: 0.3 10*3/uL (ref 0.0–0.5)
Eosinophils Relative: 6 %
HCT: 32.8 % — ABNORMAL LOW (ref 39.0–52.0)
Hemoglobin: 11.2 g/dL — ABNORMAL LOW (ref 13.0–17.0)
Immature Granulocytes: 1 %
Lymphocytes Relative: 23 %
Lymphs Abs: 1.1 10*3/uL (ref 0.7–4.0)
MCH: 32.4 pg (ref 26.0–34.0)
MCHC: 34.1 g/dL (ref 30.0–36.0)
MCV: 94.8 fL (ref 80.0–100.0)
Monocytes Absolute: 0.9 10*3/uL (ref 0.1–1.0)
Monocytes Relative: 18 %
Neutro Abs: 2.7 10*3/uL (ref 1.7–7.7)
Neutrophils Relative %: 51 %
Platelet Count: 224 10*3/uL (ref 150–400)
RBC: 3.46 MIL/uL — ABNORMAL LOW (ref 4.22–5.81)
RDW: 13.9 % (ref 11.5–15.5)
WBC Count: 5.1 10*3/uL (ref 4.0–10.5)
nRBC: 0 % (ref 0.0–0.2)

## 2022-11-14 LAB — CMP (CANCER CENTER ONLY)
ALT: 15 U/L (ref 0–44)
AST: 15 U/L (ref 15–41)
Albumin: 3.6 g/dL (ref 3.5–5.0)
Alkaline Phosphatase: 115 U/L (ref 38–126)
Anion gap: 9 (ref 5–15)
BUN: 21 mg/dL (ref 8–23)
CO2: 23 mmol/L (ref 22–32)
Calcium: 8.7 mg/dL — ABNORMAL LOW (ref 8.9–10.3)
Chloride: 107 mmol/L (ref 98–111)
Creatinine: 1.16 mg/dL (ref 0.61–1.24)
GFR, Estimated: >60 mL/min (ref >60)
Glucose, Bld: 336 mg/dL — ABNORMAL HIGH (ref 70–99)
Potassium: 3.4 mmol/L — ABNORMAL LOW (ref 3.5–5.1)
Sodium: 139 mmol/L (ref 135–145)
Total Bilirubin: 0.7 mg/dL (ref <1.2)
Total Protein: 6.1 g/dL — ABNORMAL LOW (ref 6.5–8.1)

## 2022-11-14 LAB — LACTATE DEHYDROGENASE: LDH: 133 U/L (ref 98–192)

## 2022-11-14 MED ORDER — FLUOROURACIL CHEMO INJECTION 5 GM/100ML
1920.0000 mg/m2 | INTRAVENOUS | Status: DC
  Administered 2022-11-14: 3500 mg via INTRAVENOUS
  Filled 2022-11-14: qty 70

## 2022-11-14 MED ORDER — DEXTROSE 5 % IV SOLN
INTRAVENOUS | Status: DC
Start: 2022-11-14 — End: 2022-11-14

## 2022-11-14 MED ORDER — DEXAMETHASONE SODIUM PHOSPHATE 10 MG/ML IJ SOLN
10.0000 mg | Freq: Once | INTRAMUSCULAR | Status: AC
  Administered 2022-11-14: 10 mg via INTRAVENOUS
  Filled 2022-11-14: qty 1

## 2022-11-14 MED ORDER — OXALIPLATIN CHEMO INJECTION 100 MG/20ML
68.0000 mg/m2 | Freq: Once | INTRAVENOUS | Status: AC
Start: 2022-11-14 — End: 2022-11-14
  Administered 2022-11-14: 120 mg via INTRAVENOUS
  Filled 2022-11-14: qty 20

## 2022-11-14 MED ORDER — LEUCOVORIN CALCIUM INJECTION 350 MG
400.0000 mg/m2 | Freq: Once | INTRAMUSCULAR | Status: DC
  Filled 2022-11-14: qty 34.6

## 2022-11-14 MED ORDER — INSULIN ASPART 100 UNIT/ML IJ SOLN
15.0000 [IU] | Freq: Once | INTRAMUSCULAR | Status: AC
  Administered 2022-11-14: 15 [IU] via SUBCUTANEOUS
  Filled 2022-11-14: qty 0.15

## 2022-11-14 MED ORDER — SODIUM CHLORIDE 0.9 % IV SOLN
400.0000 mg/m2 | Freq: Once | INTRAVENOUS | Status: AC
  Administered 2022-11-14: 692 mg via INTRAVENOUS
  Filled 2022-11-14: qty 34.6

## 2022-11-14 MED ORDER — PALONOSETRON HCL INJECTION 0.25 MG/5ML
0.2500 mg | Freq: Once | INTRAVENOUS | Status: AC
  Administered 2022-11-14: 0.25 mg via INTRAVENOUS
  Filled 2022-11-14: qty 5

## 2022-11-14 MED ORDER — SODIUM CHLORIDE 0.9 % IV SOLN
150.0000 mg | Freq: Once | INTRAVENOUS | Status: AC
  Administered 2022-11-14: 150 mg via INTRAVENOUS
  Filled 2022-11-14: qty 150

## 2022-11-14 MED ORDER — SODIUM CHLORIDE 0.9 % IV SOLN
120.0000 mg/m2 | Freq: Once | INTRAVENOUS | Status: AC
Start: 2022-11-14 — End: 2022-11-14
  Administered 2022-11-14: 200 mg via INTRAVENOUS
  Filled 2022-11-14: qty 10

## 2022-11-14 MED ORDER — ATROPINE SULFATE 1 MG/ML IV SOLN
0.5000 mg | Freq: Once | INTRAVENOUS | Status: AC | PRN
  Administered 2022-11-14: 0.5 mg via INTRAVENOUS
  Filled 2022-11-14: qty 1

## 2022-11-14 NOTE — Progress Notes (Signed)
Hematology and Oncology Follow Up Visit  MAXIMINO ALCORN [id number] 05-16-1940 82 y.o. 11/14/2022   Principle Diagnosis:  Adenocarcinoma of the pancreas-stage Ib (T2N0M0) --clinical stage --BRCA (-), KRAS (+), HRD(-)  Current Therapy:   Status post biliary stent placement FOLFIRINOX -- Neoadj - s/p cycle #1/4  - start on 10/31/2022     Interim History:  Mr. Mcinerny is back for follow-up.  He has a looks quite good.  He tolerated his first cycle of chemotherapy well.  His blood sugars are quite high.  Will go ahead and give him some extra insulin today.  He has had no abdominal pain.  He has had no cough or shortness of breath.  He has had no nausea or vomiting.  There is been no mouth sores.  He walks 1-3 miles a day.  Again he is staying quite active.  His weight is going back up which is nice to see.  Overall, I would say his performance is probably ECOG 1. s .    Medications:  Current Outpatient Medications:    Accu-Chek Softclix Lancets lancets, Use to test blood glucose once daily, Disp: 100 each, Rfl: 3   amLODipine (NORVASC) 10 MG tablet, TAKE ONE TABLET BY MOUTH DAILY, Disp: 90 tablet, Rfl: 3   Blood Glucose Calibration (EMBRACE PRO GLUCOSE CONTROL) LIQD, Use to check blood glucose TID, Disp: 1 each, Rfl:    Blood Glucose Monitoring Suppl (ACCU-CHEK AVIVA PLUS) w/Device KIT, Use to test blood glucose once daily, Disp: 1 kit, Rfl: 0   Continuous Glucose Sensor (FREESTYLE LIBRE 3 SENSOR) MISC, 1 Device by Does not apply route every 14 (fourteen) days. Place 1 sensor on the skin every 14 days. Use to check glucose continuously, Disp: 2 each, Rfl: 6   dexamethasone (DECADRON) 4 MG tablet, Take 2 tablets (8 mg total) by mouth daily. Take 2 tablets daily x 3 days starting the day after chemotherapy. Take with food., Disp: 30 tablet, Rfl: 1   glucose blood (EMBRACE PRO GLUCOSE TEST) test strip, Use as instructed, Disp: 100 each, Rfl: 12   insulin glargine (LANTUS) 100  UNIT/ML injection, Inject 0.1 mLs (10 Units total) into the skin daily. (Patient taking differently: Inject 25 Units into the skin daily.), Disp: 10 mL, Rfl: 1   Insulin Pen Needle 29G X MISC, Use daily for insulin pen, Disp: 90 each, Rfl: 1   insulin regular (HUMULIN R) 100 units/mL injection, Inject 0.05 mLs (5 Units total) into the skin 3 (three) times daily before meals., Disp: 10 mL, Rfl: 11   Insulin Syringe-Needle U-100 (INSULIN SYRINGE .5CC/30GX5/16") 30G X 5/16" 0.5 ML MISC, Inject 5 Units into the skin 3 (three) times daily before meals., Disp: 100 each, Rfl: 3   lipase/protease/amylase (CREON) 36000 UNITS CPEP capsule, Take 2 capsules (72,000 Units total) by mouth 3 (three) times daily with meals. May also take 1 capsule (36,000 Units total) as needed (with snacks - up to 4 snacks daily)., Disp: 180 capsule, Rfl: 5   loperamide (IMODIUM) 2 MG capsule, Take 2 tabs by mouth with first loose stool, then 1 tab with each additional loose stool as needed. Do not exceed 8 tabs in a 24-hour period, Disp: 100 capsule, Rfl: 3   OLANZapine (ZYPREXA) 5 MG tablet, Take 1 tablet (5 mg total) by mouth at bedtime., Disp: 30 tablet, Rfl: 4   ondansetron (ZOFRAN) 8 MG tablet, Take 1 tablet (8 mg total) by mouth every 8 (eight) hours as needed for nausea or  vomiting. Start on the third day after chemotherapy, Disp: 30 tablet, Rfl: 1   pantoprazole (PROTONIX) 40 MG tablet, Take 1 tablet (40 mg total) by mouth 2 (two) times daily before a meal., Disp: 60 tablet, Rfl: 6   prochlorperazine (COMPAZINE) 10 MG tablet, Take 1 tablet (10 mg total) by mouth every 6 (six) hours as needed for nausea or vomiting (Nausea or vomiting)., Disp: 30 tablet, Rfl: 1 No current facility-administered medications for this visit.  Facility-Administered Medications Ordered in Other Visits:    dexamethasone (DECADRON) injection 10 mg, 10 mg, Intravenous, Once, Imanol Bihl, Rose Phi, MD   dextrose 5 % solution, , Intravenous, Continuous,  Kavya Haag, Rose Phi, MD   fluorouracil (ADRUCIL) 3,500 mg in sodium chloride 0.9 % 80 mL chemo infusion, 1,920 mg/m2 (Treatment Plan Recorded), Intravenous, 1 day or 1 dose, Yoltzin Barg, Rose Phi, MD   fosaprepitant (EMEND) 150 mg in sodium chloride 0.9 % 145 mL IVPB, 150 mg, Intravenous, Once, Poetry Cerro, Rose Phi, MD   irinotecan (CAMPTOSAR) 200 mg in sodium chloride 0.9 % 500 mL chemo infusion, 120 mg/m2 (Treatment Plan Recorded), Intravenous, Once, Philippe Gang, Rose Phi, MD   leucovorin 692 mg in sodium chloride 0.9 % 250 mL infusion, 400 mg/m2 (Treatment Plan Recorded), Intravenous, Once, Karanveer Ramakrishnan, Rose Phi, MD   oxaliplatin (ELOXATIN) 120 mg in dextrose 5 % 500 mL chemo infusion, 68 mg/m2 (Treatment Plan Recorded), Intravenous, Once, Dontai Pember, Rose Phi, MD   palonosetron (ALOXI) injection 0.25 mg, 0.25 mg, Intravenous, Once, Charliee Krenz, Rose Phi, MD  Allergies:  Allergies  Allergen Reactions   Metformin And Related Diarrhea   Metformin Hcl Diarrhea    Past Medical History, Surgical history, Social history, and Family History were reviewed and updated.  Review of Systems: Review of Systems  Constitutional:  Positive for unexpected weight change.  HENT:  Negative.    Eyes:  Positive for icterus.  Respiratory: Negative.    Cardiovascular: Negative.   Gastrointestinal: Negative.   Endocrine: Negative.   Genitourinary: Negative.    Musculoskeletal: Negative.   Skin:  Positive for rash.  Neurological: Negative.   Hematological: Negative.   Psychiatric/Behavioral: Negative.      Physical Exam:  weight is 143 lb (64.9 kg). His oral temperature is 98 F (36.7 C). His blood pressure is 134/67 and his pulse is 68. His oxygen saturation is 100%.   Wt Readings from Last 3 Encounters:  11/14/22 143 lb (64.9 kg)  10/27/22 133 lb (60.3 kg)  10/25/22 141 lb (64 kg)    Physical Exam Vitals reviewed.  HENT:     Head: Normocephalic and atraumatic.  Eyes:     Pupils: Pupils are equal, round, and reactive to  light.     Comments: Ocular exam does show some slight scleral icterus.  Cardiovascular:     Rate and Rhythm: Normal rate and regular rhythm.     Heart sounds: Normal heart sounds.  Pulmonary:     Effort: Pulmonary effort is normal.     Breath sounds: Normal breath sounds.  Abdominal:     General: Bowel sounds are normal.     Palpations: Abdomen is soft.     Comments: Abdominal exam is soft.  He has good bowel sounds.  There is no fluid wave.  There is no obvious abdominal mass.  There is no palpable hepatosplenomegaly.  Musculoskeletal:        General: No tenderness or deformity. Normal range of motion.     Cervical back: Normal range of motion.  Lymphadenopathy:  Cervical: No cervical adenopathy.  Skin:    General: Skin is warm and dry.     Findings: No erythema or rash.     Comments: Skin exam does not show any obvious rashes.  He has some jaundice.  Neurological:     Mental Status: He is alert and oriented to person, place, and time.  Psychiatric:        Behavior: Behavior normal.        Thought Content: Thought content normal.        Judgment: Judgment normal.      Lab Results  Component Value Date   WBC 5.1 11/14/2022   HGB 11.2 (L) 11/14/2022   HCT 32.8 (L) 11/14/2022   MCV 94.8 11/14/2022   PLT 224 11/14/2022     Chemistry      Component Value Date/Time   NA 139 11/14/2022 1050   NA 144 11/29/2016 1054   NA 139 12/29/2015 0954   K 3.4 (L) 11/14/2022 1050   K 4.2 11/29/2016 1054   K 4.1 12/29/2015 0954   CL 107 11/14/2022 1050   CL 102 11/29/2016 1054   CO2 23 11/14/2022 1050   CO2 30 11/29/2016 1054   CO2 26 12/29/2015 0954   BUN 21 11/14/2022 1050   BUN 22 11/29/2016 1054   BUN 20.0 12/29/2015 0954   CREATININE 1.16 11/14/2022 1050   CREATININE 1.2 11/29/2016 1054   CREATININE 1.3 12/29/2015 0954      Component Value Date/Time   CALCIUM 8.7 (L) 11/14/2022 1050   CALCIUM 9.3 11/29/2016 1054   CALCIUM 9.5 12/29/2015 0954   ALKPHOS 115  11/14/2022 1050   ALKPHOS 92 (H) 11/29/2016 1054   ALKPHOS 98 12/29/2015 0954   AST 15 11/14/2022 1050   AST 16 12/29/2015 0954   ALT 15 11/14/2022 1050   ALT 20 11/29/2016 1054   ALT 13 12/29/2015 0954   BILITOT 0.7 11/14/2022 1050   BILITOT 0.71 12/29/2015 0954      Impression and Plan: Mr. Jachim is a very nice 82 year old white male.  Again, we have been seeing him for polycythemia vera.  Now, his problem is he has early stage pancreatic cancer.  We will go ahead with a second cycle of chemotherapy.  Again I am very impressed with his resilience.  We will go ahead and give him some insulin today.  I will get him back in 2 weeks for his next cycle.  Again we will give him 4 cycles and then we will plan for follow-up scans.   Josph Macho, MD 11/12/202411:45 AM

## 2022-11-14 NOTE — Progress Notes (Signed)
Patient tolerated his first treatment cycle well. He will proceed with cycle two.   Received information that his genetics did show a mutation, but not one associated with pancreatic cancer. He was asked if he wanted to see genetics before his scheduled appointment and he declined. He wishes to see them as scheduled.   Oncology Nurse Navigator Documentation     11/14/2022   12:15 PM  Oncology Nurse Navigator Flowsheets  Navigator Follow Up Date: 11/27/2022  Navigator Follow Up Reason: Follow-up Appointment;Chemotherapy  Navigator Location [facility]  Navigator Encounter Type Treatment;Appt/Treatment Plan Review  Patient Visit Type MedOnc  Treatment Phase Active Tx  Barriers/Navigation Needs Coordination of Care;Education  Interventions Psycho-Social Support;Coordination of Care  Acuity Level 2-Minimal Needs (1-2 Barriers Identified)  Coordination of Care Other  Support Groups/Services Friends and Family  Time Spent with Patient 15

## 2022-11-14 NOTE — Patient Instructions (Signed)
[facility] - A DEPT OF [facility]  Discharge Instructions: Thank you for choosing [facility] to provide your oncology and hematology care.   If you have a lab appointment with the Cancer Center, please go directly to the Cancer Center and check in at the registration area.  Wear comfortable clothing and clothing appropriate for easy access to any Portacath or PICC line.   We strive to give you quality time with your provider. You may need to reschedule your appointment if you arrive late (15 or more minutes).  Arriving late affects you and other patients whose appointments are after yours.  Also, if you miss three or more appointments without notifying the office, you may be dismissed from the clinic at the provider's discretion.      For prescription refill requests, have your pharmacy contact our office and allow 72 hours for refills to be completed.    Today you received the following chemotherapy and/or immunotherapy agents:  Oxaliplatin, Irinotecan, Leucovorin and 5FU.       To help prevent nausea and vomiting after your treatment, we encourage you to take your nausea medication as directed.  BELOW ARE SYMPTOMS THAT SHOULD BE REPORTED IMMEDIATELY: *FEVER GREATER THAN 100.4 F (38 C) OR HIGHER *CHILLS OR SWEATING *NAUSEA AND VOMITING THAT IS NOT CONTROLLED WITH YOUR NAUSEA MEDICATION *UNUSUAL SHORTNESS OF BREATH *UNUSUAL BRUISING OR BLEEDING *URINARY PROBLEMS (pain or burning when urinating, or frequent urination) *BOWEL PROBLEMS (unusual diarrhea, constipation, pain near the anus) TENDERNESS IN MOUTH AND THROAT WITH OR WITHOUT PRESENCE OF ULCERS (sore throat, sores in mouth, or a toothache) UNUSUAL RASH, SWELLING OR PAIN  UNUSUAL VAGINAL DISCHARGE OR ITCHING   Items with * indicate a potential emergency and should be followed up as soon as possible or go to the Emergency Department if any problems should occur.  Please show the  CHEMOTHERAPY ALERT CARD or IMMUNOTHERAPY ALERT CARD at check-in to the Emergency Department and triage nurse. Should you have questions after your visit or need to cancel or reschedule your appointment, please contact [facility] - A DEPT OF [facility]  [phone] and follow the prompts.  Office hours are 8:00 a.m. to 4:30 p.m. Monday - Friday. Please note that voicemails left after 4:00 p.m. may not be returned until the following business day.  We are closed weekends and major holidays. You have access to a nurse at all times for urgent questions. Please call the main number to the clinic [phone] and follow the prompts.  For any non-urgent questions, you may also contact your provider using MyChart. We now offer e-Visits for anyone 46 and older to request care online for non-urgent symptoms. For details visit [email or web address].   Also download the MyChart app! Go to the app store, search "MyChart", open the app, select [place], and log in with your MyChart username and password.

## 2022-11-15 LAB — CANCER ANTIGEN 19-9: CA 19-9: 115 U/mL — ABNORMAL HIGH (ref 0–35)

## 2022-11-16 ENCOUNTER — Inpatient Hospital Stay: Payer: Medicare HMO

## 2022-11-16 VITALS — BP 140/74 | HR 62 | Temp 97.6°F | Resp 18

## 2022-11-16 DIAGNOSIS — C252 Malignant neoplasm of tail of pancreas: Secondary | ICD-10-CM

## 2022-11-16 DIAGNOSIS — Z5111 Encounter for antineoplastic chemotherapy: Secondary | ICD-10-CM | POA: Diagnosis not present

## 2022-11-16 MED ORDER — HEPARIN SOD (PORK) LOCK FLUSH 100 UNIT/ML IV SOLN
500.0000 [IU] | Freq: Once | INTRAVENOUS | Status: AC | PRN
  Administered 2022-11-16: 500 [IU]

## 2022-11-16 MED ORDER — SODIUM CHLORIDE 0.9% FLUSH
10.0000 mL | INTRAVENOUS | Status: DC | PRN
Start: 2022-11-16 — End: 2022-11-16
  Administered 2022-11-16: 10 mL

## 2022-11-16 NOTE — Patient Instructions (Signed)

## 2022-11-17 ENCOUNTER — Encounter: Payer: Self-pay | Admitting: Adult Health

## 2022-11-17 ENCOUNTER — Other Ambulatory Visit: Payer: Self-pay

## 2022-11-17 NOTE — Telephone Encounter (Signed)
Please advise 

## 2022-11-17 NOTE — Patient Outreach (Signed)
Care Management  Transitions of Care Program Transitions of Care Post-discharge Program Completion   11/17/2022 Name: Alexander [name] MRN: [id number] DOB: 07/01/1940  Subjective: Alexander [name] is a 82 y.o. year old male who is a primary care patient of Shirline Frees, NP. The Care Management team Engaged with patient Engaged with patient by telephone to assess and address transitions of care needs.   Consent to Services:  Patient was given information about care management services, agreed to services, and gave verbal consent to participate.   Assessment:   Patient voices no new complaints Patient has not developed/ reported any new Medical issues / Dx or acute changes.- since last follow-up call for most recent  Hospital stay     10/10-10/12 / 2024  Today he is irritated and annoyed at his Dargan 3 device He says it keeps waking him up for low BG He checks and gets Fingerstick reading of 70's. He stated his [place] low is 100. Troubleshot settings with spouse. They are going to call customer support to check calibration try reload app.Their Granddaughter lives with them and is able to assist. Overall He is eating better, He spoke with Nutritionist last week,she provided several suggestions and options. He is tolerating things slightly better. He has recent treatment and pump removal 11/14 Patient educated on red flags s/s to watch for and was encouraged to report any of these identified , any new symptoms , changes in baseline or  medication regimen,  change in health status  /  well-being, or safety concerns to PCP and / or the  [facility] Case Management team .          SDOH Interventions    Flowsheet Row Patient Outreach from 11/08/2022 in [place] [facility] Clinical Support from 09/09/2021 in [facility] [place] Clinical Support from 09/08/2020 in [facility] [place] Clinical Support from 09/26/2019 in [facility] [place] Chronic Care Management from 07/03/2019 in [facility] [place]  SDOH Interventions       Food Insecurity Interventions Intervention Not Indicated Intervention Not Indicated Intervention Not Indicated Intervention Not Indicated --  Housing Interventions Intervention Not Indicated Intervention Not Indicated Intervention Not Indicated Intervention Not Indicated --  Transportation Interventions Intervention Not Indicated, Patient Resources (Friends/Family) Intervention Not Indicated Intervention Not Indicated -- Intervention Not Indicated  Utilities Interventions Intervention Not Indicated Intervention Not Indicated -- -- --  Alcohol Usage Interventions -- Intervention Not Indicated (Score <7) -- -- --  Depression Interventions/Treatment  -- -- -- PHQ2-9 Score <4 Follow-up Not Indicated --  Financial Strain Interventions -- Intervention Not Indicated Intervention Not Indicated -- Intervention Not Indicated  Physical Activity Interventions -- Intervention Not Indicated Intervention Not Indicated Intervention Not Indicated --  Stress Interventions -- Intervention Not Indicated Intervention Not Indicated Intervention Not Indicated --  Social Connections Interventions -- Intervention Not Indicated Intervention Not Indicated Intervention Not Indicated --        Goals Addressed             This Visit's Progress    TOC Care Plan       Current Barriers:  Care Coordination s/p hospital discharge for on-going medical conditions and s/s management    RNCM Clinical Goal(s):  Patient will work with the Care Management team over the next 30 days to address Transition of Care Barriers: Medication Management Diet/Nutrition/Food Resources Provider appointments take all medications exactly as prescribed and will call provider  for medication related questions as evidenced by and change in s/s BG not controlled with medication or interventions  attend all  scheduled medical appointments: with PCP and oncologist  as evidenced by no missed appt no unplanned ER visits  demonstrate ongoing self health care management ability notifying PCP and Specialists of ant change in status  as evidenced by no unmanaged or adverse side effects and acute changes will be managed  Collaborate with Nutritionist on meal options to ensure he has adequate intake   through collaboration with RN Care manager, provider, and care team.   Interventions: Evaluation of current treatment plan related to  self management and patient's adherence to plan as established by provider  Transitions of Care:  Goal Met. Doctor Visits  - discussed the importance of doctor visits  Patient Goals/Self-Care Activities: Participate in Transition of Care Program/Attend [facility] scheduled calls Take all medications as prescribed Attend all scheduled provider appointments Call pharmacy for medication refills 3-7 days in advance of running out of medications Perform all self care activities independently  Perform IADL's (shopping, preparing meals, housekeeping, managing finances) independently Call provider office for new concerns or questions   Follow Up Plan:  The patient has been provided with contact information for the care management team and has been advised to call with any health related questions or concerns.  No further follow up required: Completed 30 day Program           Plan:  The patient has successfully completed the 30-day TOC Program. Condition is stable No further acute needs identified at this time. Chronic conditions and ongoing care is  managed thru collaboration with  PCP,  Specialists and additional Healthcare Providers if indicated . Patient verbalized understanding of ongoing plan of care.  SDOH needs have been screened and interventions provided if identified.  Reviewed current home medications with spouse  -- provided education as needed.  Previously discussed  rationale of use, how/when to take medications. Patient is aware of potential side effects, and was encouraged to notify PCP for any changes in condition or signs / symptoms not relieved  with interventions.   Patient will call 911 for Medical Emergencies or Life -Threatening or report to a local emergency department or urgent care.   Patient was encouraged to Contact PCP  with any questions or concerns regarding ongoing  medical care, any  difficulty obtaining or picking up  prescriptions, any  changes or  worsening in  condition including signs / symptoms not relieved  with interventions  Patient had no additional questions or concerns at this time. Current needs addressed.     The patient has been provided with contact information for the care management team and has been advised to call with any health related questions or concerns.   Susa Loffler , BSN, RN Care Management Coordinator [place]   [facility] [email or web address] Direct Dial: [phone]

## 2022-11-20 NOTE — Addendum Note (Signed)
Encounter addended by: Edward Qualia on: 11/20/2022 12:43 PM  Actions taken: Imaging Exam ended

## 2022-11-21 ENCOUNTER — Ambulatory Visit (INDEPENDENT_AMBULATORY_CARE_PROVIDER_SITE_OTHER): Payer: Medicare HMO | Admitting: Adult Health

## 2022-11-21 ENCOUNTER — Encounter: Payer: Self-pay | Admitting: Adult Health

## 2022-11-21 VITALS — BP 120/80 | HR 65 | Temp 98.1°F | Ht 71.0 in | Wt 143.0 lb

## 2022-11-21 DIAGNOSIS — I8391 Asymptomatic varicose veins of right lower extremity: Secondary | ICD-10-CM

## 2022-11-21 DIAGNOSIS — Z794 Long term (current) use of insulin: Secondary | ICD-10-CM | POA: Diagnosis not present

## 2022-11-21 DIAGNOSIS — E1169 Type 2 diabetes mellitus with other specified complication: Secondary | ICD-10-CM

## 2022-11-21 MED ORDER — INSULIN GLARGINE 100 UNIT/ML ~~LOC~~ SOLN: 20.0000 [IU] | Freq: Two times a day (BID) | SUBCUTANEOUS | 1 refills | Status: DC

## 2022-11-21 MED ORDER — INSULIN SYRINGE 30G X 5/16" 0.3 ML MISC: 3 refills | Status: DC

## 2022-11-21 NOTE — Progress Notes (Signed)
Subjective:    Patient ID: Alexander [name], male    DOB: 1940-09-10, 82 y.o.   MRN: [id number]  HPI 82 year old male who  has a past medical history of Arthritis, CAD (coronary artery disease), Diverticulosis of colon, GERD (gastroesophageal reflux disease), GI bleed, Gout, Hematuria, microscopic, Hemorrhoids, Hyperglycemia, Hyperlipidemia, Hypertension, Lumbar back pain, Microalbuminuria, Overweight(278.02), Pancreatic cancer (HCC) (09/22/2022), Polycythemia rubra vera (HCC), and Tubular adenoma of colon (07/2012).  He presents to the office today for an acute complaint of a "painful varicose vein".  He has had a large varicose vein in his right upper thigh for many years.  This varicose vein has not changed in size over the years.  Over the last few days the varicose vein became painful to touch.  He did not notice any redness or warmth from the area.  He applies some topical ointment pain relief and he is no longer having any pain at the site of the varicose vein.  Furthermore, he is a type II diabetic.  Currently going through cancer treatment for pancreatic cancer.  Was started on insulin therapy a couple of months ago.  He is currently on Lantus 30 units nightly and insulin aspart 5 to 8 units 3 times daily with meals.  His blood sugars have started to improve over the last 90 days.  He is at goal 53% of the time, 28% of the time he is 1 81-2 50 and 14% of the time is greater than 250.  He has had some hypoglycemic events about 4% of the time.  He was dealing with his alarm going off in the middle the night but once he switched arms the alarm stopped going off.  It sounds like he was sleeping on his side where the alarm was attached to his arm.  Is not regulating his diet at home and is eating as much as he can to put on weight as he goes through cancer treatment.  Review of Systems See HPI   Past Medical History:  Diagnosis Date   Arthritis    "minor; shoulders primarily" (05/20/2013)    CAD (coronary artery disease)    a. 05/2013 - Chest pain/unstable angina and dynamic EKG changes showing cath showing atherosclerotic coronary artery disease manifested as diffuse ectasia, normal EF.   Diverticulosis of colon    GERD (gastroesophageal reflux disease)    GI bleed    secondary to Aspirin   Gout    Hematuria, microscopic    Hemorrhoids    Hyperglycemia    Hyperlipidemia    Hypertension    Lumbar back pain    Microalbuminuria    Overweight(278.02)    Pancreatic cancer (HCC) 09/22/2022   Polycythemia rubra vera (HCC)    Tubular adenoma of colon 07/2012    Social History   Socioeconomic History   Marital status: Married    Spouse name: Not on file   Number of children: 3   Years of education: Not on file   Highest education level: Some college, no degree  Occupational History   Occupation: retired Garment/textile technologist: RETIRED  Tobacco Use   Smoking status: Never   Smokeless tobacco: Never   Tobacco comments:    NEVER USED TOBACCO  Vaping Use   Vaping status: Never Used  Substance and Sexual Activity   Alcohol use: No    Alcohol/week: 0.0 standard drinks of alcohol   Drug use: No   Sexual activity: Yes    Partners: Female  Other Topics Concern   Not on file  Social History Narrative   Marriedfor 52 years    Daughter, Elease Hashimoto (pediatrician--lives in [place], [place]), one in [place] - Warden/ranger. One in [place] - Chartered loss adjuster          Social Determinants of Health   Financial Resource Strain: Low Risk  (10/16/2022)   Overall Financial Resource Strain (CARDIA)    Difficulty of Paying Living Expenses: Not hard at all  Food Insecurity: No Food Insecurity (11/08/2022)   Hunger Vital Sign    Worried About Running Out of Food in the Last Year: Never true    Ran Out of Food in the Last Year: Never true  Transportation Needs: No Transportation Needs (11/08/2022)   PRAPARE - Administrator, Civil Service (Medical): No    Lack of Transportation  (Non-Medical): No  Physical Activity: Sufficiently Active (10/16/2022)   Exercise Vital Sign    Days of Exercise per Week: 3 days    Minutes of Exercise per Session: 60 min  Stress: No Stress Concern Present (10/16/2022)   [employer] of Occupational Health - Occupational Stress Questionnaire    Feeling of Stress : Only a little  Social Connections: Socially Integrated (10/16/2022)   Social Connection and Isolation Panel [NHANES]    Frequency of Communication with Friends and Family: More than three times a week    Frequency of Social Gatherings with Friends and Family: More than three times a week    Attends Religious Services: 1 to 4 times per year    Active Member of [employer] or Organizations: Yes    Attends Banker Meetings: Never    Marital Status: Married  Catering manager Violence: Not At Risk (11/08/2022)   Humiliation, Afraid, Rape, and Kick questionnaire    Fear of Current or Ex-Partner: No    Emotionally Abused: No    Physically Abused: No    Sexually Abused: No    Past Surgical History:  Procedure Laterality Date   BILIARY STENT PLACEMENT N/A 09/02/2022   Procedure: BILIARY STENT PLACEMENT;  Surgeon: Hilarie Fredrickson, MD;  Location: [facility];  Service: Gastroenterology;  Laterality: N/A;   BILIARY STENT PLACEMENT  10/13/2022   Procedure: BILIARY STENT PLACEMENT;  Surgeon: Meryl Dare, MD;  Location: [facility] ENDOSCOPY;  Service: Gastroenterology;;   BIOPSY  09/11/2022   Procedure: BIOPSY;  Surgeon: Lemar Lofty., MD;  Location: [facility] ENDOSCOPY;  Service: Gastroenterology;;   CARDIAC CATHETERIZATION  05/20/2013   CATARACT EXTRACTION W/ INTRAOCULAR LENS  IMPLANT, BILATERAL Bilateral 2008   CYSTECTOMY  ~ 2000   " SEBACEOUS cyst removed from between shoulder blades"   ENDOSCOPIC RETROGRADE CHOLANGIOPANCREATOGRAPHY (ERCP) WITH PROPOFOL N/A 09/02/2022   Procedure: ENDOSCOPIC RETROGRADE CHOLANGIOPANCREATOGRAPHY (ERCP) WITH PROPOFOL;  Surgeon: Hilarie Fredrickson,  MD;  Location: [facility];  Service: Gastroenterology;  Laterality: N/A;   ENDOSCOPIC RETROGRADE CHOLANGIOPANCREATOGRAPHY (ERCP) WITH PROPOFOL N/A 10/13/2022   Procedure: ENDOSCOPIC RETROGRADE CHOLANGIOPANCREATOGRAPHY (ERCP) WITH PROPOFOL;  Surgeon: Meryl Dare, MD;  Location: [facility] ENDOSCOPY;  Service: Gastroenterology;  Laterality: N/A;   ESOPHAGOGASTRODUODENOSCOPY N/A 09/11/2022   Procedure: ESOPHAGOGASTRODUODENOSCOPY (EGD);  Surgeon: Lemar Lofty., MD;  Location: [facility];  Service: Gastroenterology;  Laterality: N/A;   EUS N/A 09/11/2022   Procedure: UPPER ENDOSCOPIC ULTRASOUND (EUS) RADIAL;  Surgeon: Lemar Lofty., MD;  Location: [facility] ENDOSCOPY;  Service: Gastroenterology;  Laterality: N/A;   EXCISIONAL HEMORRHOIDECTOMY  1970's   FINE NEEDLE ASPIRATION N/A 09/11/2022   Procedure: FINE NEEDLE ASPIRATION (FNA) LINEAR;  Surgeon: Lemar Lofty., MD;  Location: [facility];  Service: Gastroenterology;  Laterality: N/A;   INGUINAL HERNIA REPAIR Right ~ 2010   IR IMAGING GUIDED PORT INSERTION  10/27/2022   LEFT HEART CATHETERIZATION WITH CORONARY ANGIOGRAM N/A 05/20/2013   Procedure: LEFT HEART CATHETERIZATION WITH CORONARY ANGIOGRAM;  Surgeon: Peter M Swaziland, MD;  Location: [facility] CATH LAB;  Service: Cardiovascular;  Laterality: N/A;   SPHINCTEROTOMY  09/02/2022   Procedure: SPHINCTEROTOMY;  Surgeon: Hilarie Fredrickson, MD;  Location: [facility];  Service: Gastroenterology;;   Francine Graven REMOVAL  10/13/2022   Procedure: STENT REMOVAL;  Surgeon: Meryl Dare, MD;  Location: [facility] ENDOSCOPY;  Service: Gastroenterology;;   TONSILLECTOMY AND ADENOIDECTOMY  1940's   VASECTOMY      Family History  Problem Relation Age of Onset   Colon cancer Mother        died at 41, colorectal   Diabetes Mother    Heart disease Father 19       MI   Diabetes Father    Stomach cancer Maternal Grandfather    Cancer Maternal Uncle     Allergies  Allergen Reactions   Metformin And Related  Diarrhea   Metformin Hcl Diarrhea    Current Outpatient Medications on File Prior to Visit  Medication Sig Dispense Refill   Accu-Chek Softclix Lancets lancets Use to test blood glucose once daily 100 each 3   amLODipine (NORVASC) 10 MG tablet TAKE ONE TABLET BY MOUTH DAILY 90 tablet 3   Blood Glucose Calibration (EMBRACE PRO GLUCOSE CONTROL) LIQD Use to check blood glucose TID 1 each    Blood Glucose Monitoring Suppl (ACCU-CHEK AVIVA PLUS) w/Device KIT Use to test blood glucose once daily 1 kit 0   Continuous Glucose Sensor (FREESTYLE LIBRE 3 SENSOR) MISC 1 Device by Does not apply route every 14 (fourteen) days. Place 1 sensor on the skin every 14 days. Use to check glucose continuously 2 each 6   glucose blood (EMBRACE PRO GLUCOSE TEST) test strip Use as instructed 100 each 12   insulin glargine (LANTUS) 100 UNIT/ML injection Inject 0.1 mLs (10 Units total) into the skin daily. (Patient taking differently: Inject 25 Units into the skin daily.) 10 mL 1   Insulin Pen Needle 29G X MISC Use daily for insulin pen 90 each 1   insulin regular (HUMULIN R) 100 units/mL injection Inject 0.05 mLs (5 Units total) into the skin 3 (three) times daily before meals. 10 mL 11   Insulin Syringe-Needle U-100 (INSULIN SYRINGE .5CC/30GX5/16") 30G X 5/16" 0.5 ML MISC Inject 5 Units into the skin 3 (three) times daily before meals. 100 each 3   lipase/protease/amylase (CREON) 36000 UNITS CPEP capsule Take 2 capsules (72,000 Units total) by mouth 3 (three) times daily with meals. May also take 1 capsule (36,000 Units total) as needed (with snacks - up to 4 snacks daily). 180 capsule 5   loperamide (IMODIUM) 2 MG capsule Take 2 tabs by mouth with first loose stool, then 1 tab with each additional loose stool as needed. Do not exceed 8 tabs in a 24-hour period 100 capsule 3   OLANZapine (ZYPREXA) 5 MG tablet Take 1 tablet (5 mg total) by mouth at bedtime. 30 tablet 4   ondansetron (ZOFRAN) 8 MG tablet Take 1 tablet  (8 mg total) by mouth every 8 (eight) hours as needed for nausea or vomiting. Start on the third day after chemotherapy 30 tablet 1   pantoprazole (PROTONIX) 40 MG tablet Take 1  tablet (40 mg total) by mouth 2 (two) times daily before a meal. 60 tablet 6   prochlorperazine (COMPAZINE) 10 MG tablet Take 1 tablet (10 mg total) by mouth every 6 (six) hours as needed for nausea or vomiting (Nausea or vomiting). 30 tablet 1   No current facility-administered medications on file prior to visit.    Ht 5\' 11"  (1.803 m)   BMI 19.94 kg/m       Objective:   Physical Exam Vitals and nursing note reviewed.  Constitutional:      Appearance: Normal appearance.  Skin:    General: Skin is warm and dry.     Findings: No erythema.     Comments: Large varicose vein in the right upper thigh. No redness, warmth or pain noted.   Neurological:     General: No focal deficit present.     Mental Status: He is alert and oriented to person, place, and time.  Psychiatric:        Mood and Affect: Mood normal.        Behavior: Behavior normal.        Thought Content: Thought content normal.        Judgment: Judgment normal.       Assessment & Plan:  1. Type 2 diabetes mellitus with other specified complication, without long-term current use of insulin (HCC) -I am happy he is gaining weight and his blood sugars have come down.  I am going to increase his Lantus to 20 units twice daily and see if we can even out his blood sugars better.  He can continue with insulin are 5 to 8 units 3 times daily if needed. - insulin glargine (LANTUS) 100 UNIT/ML injection; Inject 0.2 mLs (20 Units total) into the skin 2 (two) times daily.  Dispense: 12 mL; Refill: 1  2. Asymptomatic varicose veins of right lower extremity -Currently asymptomatic.  No signs of DVT.  Will continue to monitor.  Shirline Frees, NP  Time spent with patient today was 34 minutes which consisted of chart review, discussing  DM Type 2 and varicose  vein  work up, treatment answering questions and documentation.

## 2022-11-22 ENCOUNTER — Inpatient Hospital Stay: Payer: Medicare HMO | Admitting: Dietician

## 2022-11-22 NOTE — Progress Notes (Signed)
Nutrition follow up:   Reached out to patient at home telephone#.  He reports no progress with dysgeusia.  Everything still tastes horrible.  Some foods taste fine going down (orange juice) but afterwards terrible taste in mouth.  He is hold his nose and forcing foods down.  He said he has requested treatment be held until after Thanksgiving to try not to ruin his holiday.  He reports eating good potassium sources daily.  Insulin regimen has been adjusted for continued elevated glucose levels.   Labs: 11/14/22  K+3.4  Glucose 336   Anthropometrics:  weight has rebounded with 10# gain  Height: 71" Weight:  11/21/22  143# 10/27/22  133# UBW: 170# BMI: 19.94   Estimated Energy Needs  Kcals: 2135-2400 Protein: 73-92g Fluid: 2.4   NUTRITION DIAGNOSIS: Inadequate PO intake overall r/t dysgeusia to meet increased nutrient needs. Improving with increased weight and patient force feeding himself  INTERVENTION:   Offered recipes or resources, patient declines needs at this time.   MONITORING, EVALUATION, GOAL: weight trends, nutrition impact symptoms, PO intake, labs   Next Visit: PRN at patient or providers' request  Gennaro Africa, RDN, LDN Registered Dietitian, [facility] Part Time Remote (Usual office hours: Tuesday-Thursday) Mobile: [phone]

## 2022-11-27 ENCOUNTER — Inpatient Hospital Stay: Payer: Medicare HMO

## 2022-11-27 ENCOUNTER — Encounter: Payer: Self-pay | Admitting: Hematology & Oncology

## 2022-11-27 ENCOUNTER — Encounter: Payer: Self-pay | Admitting: Adult Health

## 2022-11-27 ENCOUNTER — Inpatient Hospital Stay: Payer: Medicare HMO | Admitting: Hematology & Oncology

## 2022-11-28 ENCOUNTER — Inpatient Hospital Stay: Payer: Medicare HMO

## 2022-11-28 ENCOUNTER — Encounter: Payer: Self-pay | Admitting: Adult Health

## 2022-11-28 ENCOUNTER — Other Ambulatory Visit: Payer: Self-pay | Admitting: Adult Health

## 2022-11-28 ENCOUNTER — Inpatient Hospital Stay: Payer: Medicare HMO | Admitting: Hematology & Oncology

## 2022-11-28 NOTE — Telephone Encounter (Signed)
Please advise 

## 2022-11-29 ENCOUNTER — Inpatient Hospital Stay: Payer: Medicare HMO

## 2022-12-04 ENCOUNTER — Other Ambulatory Visit: Payer: Self-pay | Admitting: *Deleted

## 2022-12-04 ENCOUNTER — Encounter: Payer: Self-pay | Admitting: Adult Health

## 2022-12-04 DIAGNOSIS — K8689 Other specified diseases of pancreas: Secondary | ICD-10-CM

## 2022-12-04 DIAGNOSIS — C252 Malignant neoplasm of tail of pancreas: Secondary | ICD-10-CM

## 2022-12-05 ENCOUNTER — Encounter: Payer: Self-pay | Admitting: *Deleted

## 2022-12-05 ENCOUNTER — Inpatient Hospital Stay: Payer: Medicare HMO

## 2022-12-05 ENCOUNTER — Encounter: Payer: Self-pay | Admitting: Adult Health

## 2022-12-05 ENCOUNTER — Encounter: Payer: Self-pay | Admitting: Hematology & Oncology

## 2022-12-05 ENCOUNTER — Inpatient Hospital Stay: Payer: Medicare HMO | Attending: Hematology & Oncology

## 2022-12-05 DIAGNOSIS — C250 Malignant neoplasm of head of pancreas: Secondary | ICD-10-CM | POA: Insufficient documentation

## 2022-12-05 DIAGNOSIS — Z5111 Encounter for antineoplastic chemotherapy: Secondary | ICD-10-CM | POA: Diagnosis present

## 2022-12-05 DIAGNOSIS — Z79899 Other long term (current) drug therapy: Secondary | ICD-10-CM | POA: Diagnosis not present

## 2022-12-05 DIAGNOSIS — C252 Malignant neoplasm of tail of pancreas: Secondary | ICD-10-CM

## 2022-12-05 LAB — CMP (CANCER CENTER ONLY)
ALT: 13 U/L (ref 0–44)
AST: 14 U/L — ABNORMAL LOW (ref 15–41)
Albumin: 3.3 g/dL — ABNORMAL LOW (ref 3.5–5.0)
Alkaline Phosphatase: 83 U/L (ref 38–126)
Anion gap: 6 (ref 5–15)
BUN: 17 mg/dL (ref 8–23)
CO2: 28 mmol/L (ref 22–32)
Calcium: 8.5 mg/dL — ABNORMAL LOW (ref 8.9–10.3)
Chloride: 106 mmol/L (ref 98–111)
Creatinine: 1.1 mg/dL (ref 0.61–1.24)
GFR, Estimated: >60 mL/min (ref >60)
Glucose, Bld: 238 mg/dL — ABNORMAL HIGH (ref 70–99)
Potassium: 4.2 mmol/L (ref 3.5–5.1)
Sodium: 140 mmol/L (ref 135–145)
Total Bilirubin: 0.6 mg/dL (ref <1.2)
Total Protein: 6.1 g/dL — ABNORMAL LOW (ref 6.5–8.1)

## 2022-12-05 LAB — CBC WITH DIFFERENTIAL (CANCER CENTER ONLY)
Abs Immature Granulocytes: 0.07 10*3/uL (ref 0.00–0.07)
Basophils Absolute: 0.1 10*3/uL (ref 0.0–0.1)
Basophils Relative: 1 %
Eosinophils Absolute: 0.2 10*3/uL (ref 0.0–0.5)
Eosinophils Relative: 3 %
HCT: 32.9 % — ABNORMAL LOW (ref 39.0–52.0)
Hemoglobin: 11 g/dL — ABNORMAL LOW (ref 13.0–17.0)
Immature Granulocytes: 1 %
Lymphocytes Relative: 20 %
Lymphs Abs: 1.3 10*3/uL (ref 0.7–4.0)
MCH: 33.8 pg (ref 26.0–34.0)
MCHC: 33.4 g/dL (ref 30.0–36.0)
MCV: 101.2 fL — ABNORMAL HIGH (ref 80.0–100.0)
Monocytes Absolute: 1.2 10*3/uL — ABNORMAL HIGH (ref 0.1–1.0)
Monocytes Relative: 19 %
Neutro Abs: 3.7 10*3/uL (ref 1.7–7.7)
Neutrophils Relative %: 56 %
Platelet Count: 235 10*3/uL (ref 150–400)
RBC: 3.25 MIL/uL — ABNORMAL LOW (ref 4.22–5.81)
RDW: 17.2 % — ABNORMAL HIGH (ref 11.5–15.5)
WBC Count: 6.4 10*3/uL (ref 4.0–10.5)
nRBC: 0 % (ref 0.0–0.2)

## 2022-12-05 MED ORDER — DEXTROSE 5 % IV SOLN
INTRAVENOUS | Status: DC
Start: 2022-12-05 — End: 2022-12-05

## 2022-12-05 MED ORDER — OXALIPLATIN CHEMO INJECTION 100 MG/20ML
68.0000 mg/m2 | Freq: Once | INTRAVENOUS | Status: AC
  Administered 2022-12-05: 120 mg via INTRAVENOUS
  Filled 2022-12-05: qty 20

## 2022-12-05 MED ORDER — DEXAMETHASONE SODIUM PHOSPHATE 10 MG/ML IJ SOLN
10.0000 mg | Freq: Once | INTRAMUSCULAR | Status: AC
  Administered 2022-12-05: 10 mg via INTRAVENOUS
  Filled 2022-12-05: qty 1

## 2022-12-05 MED ORDER — SODIUM CHLORIDE 0.9 % IV SOLN
1920.0000 mg/m2 | INTRAVENOUS | Status: DC
  Administered 2022-12-05: 3500 mg via INTRAVENOUS
  Filled 2022-12-05: qty 70

## 2022-12-05 MED ORDER — SODIUM CHLORIDE 0.9 % IV SOLN
150.0000 mg | Freq: Once | INTRAVENOUS | Status: AC
  Administered 2022-12-05: 150 mg via INTRAVENOUS
  Filled 2022-12-05: qty 150

## 2022-12-05 MED ORDER — SODIUM CHLORIDE 0.9 % IV SOLN
120.0000 mg/m2 | Freq: Once | INTRAVENOUS | Status: AC
  Administered 2022-12-05: 200 mg via INTRAVENOUS
  Filled 2022-12-05: qty 10

## 2022-12-05 MED ORDER — ATROPINE SULFATE 1 MG/ML IV SOLN
0.5000 mg | Freq: Once | INTRAVENOUS | Status: AC | PRN
  Administered 2022-12-05: 0.5 mg via INTRAVENOUS
  Filled 2022-12-05: qty 1

## 2022-12-05 MED ORDER — SODIUM CHLORIDE 0.9 % IV SOLN
400.0000 mg/m2 | Freq: Once | INTRAVENOUS | Status: AC
  Administered 2022-12-05: 692 mg via INTRAVENOUS
  Filled 2022-12-05: qty 34.6

## 2022-12-05 MED ORDER — PALONOSETRON HCL INJECTION 0.25 MG/5ML
0.2500 mg | Freq: Once | INTRAVENOUS | Status: AC
  Administered 2022-12-05: 0.25 mg via INTRAVENOUS
  Filled 2022-12-05: qty 5

## 2022-12-05 NOTE — Progress Notes (Unsigned)
Patient is doing well with treatment. He has no complaints today. Continuing with treatment.   Oncology Nurse Navigator Documentation     12/05/2022    2:15 PM  Oncology Nurse Navigator Flowsheets  Navigator Follow Up Date: 12/12/2022  Navigator Follow Up Reason: Follow-up Appointment  Navigator Location [facility]  Navigator Encounter Type Treatment;Appt/Treatment Plan Review  Patient Visit Type MedOnc  Treatment Phase Active Tx  Barriers/Navigation Needs Coordination of Care;Education  Interventions Psycho-Social Support  Acuity Level 2-Minimal Needs (1-2 Barriers Identified)  Support Groups/Services Friends and Family  Time Spent with Patient 15

## 2022-12-06 ENCOUNTER — Other Ambulatory Visit: Payer: Self-pay

## 2022-12-07 ENCOUNTER — Inpatient Hospital Stay: Payer: Medicare HMO

## 2022-12-07 ENCOUNTER — Encounter: Payer: Self-pay | Admitting: Hematology & Oncology

## 2022-12-07 VITALS — BP 143/73 | HR 52 | Temp 97.6°F | Resp 18

## 2022-12-07 DIAGNOSIS — Z5111 Encounter for antineoplastic chemotherapy: Secondary | ICD-10-CM | POA: Diagnosis not present

## 2022-12-07 DIAGNOSIS — C252 Malignant neoplasm of tail of pancreas: Secondary | ICD-10-CM

## 2022-12-07 MED ORDER — SODIUM CHLORIDE 0.9% FLUSH
10.0000 mL | INTRAVENOUS | Status: DC | PRN
  Administered 2022-12-07: 10 mL

## 2022-12-07 MED ORDER — HEPARIN SOD (PORK) LOCK FLUSH 100 UNIT/ML IV SOLN
500.0000 [IU] | Freq: Once | INTRAVENOUS | Status: AC | PRN
  Administered 2022-12-07: 500 [IU]

## 2022-12-07 NOTE — Patient Instructions (Signed)

## 2022-12-08 ENCOUNTER — Telehealth: Payer: Self-pay | Admitting: Hematology & Oncology

## 2022-12-08 NOTE — Telephone Encounter (Signed)
Pt called in to change 12/19/22 treatment date as pt has family coming into town. Pt unable to come week of 12/23 due to prior appointment commitments with spouse, so pt scheduled for 12/31 for Treatment, 1/2 for pump d/c. Pt agreeable to dates/times.

## 2022-12-09 ENCOUNTER — Other Ambulatory Visit: Payer: Self-pay

## 2022-12-11 ENCOUNTER — Inpatient Hospital Stay: Payer: Medicare HMO | Admitting: Genetic Counselor

## 2022-12-11 ENCOUNTER — Encounter: Payer: Self-pay | Admitting: Genetic Counselor

## 2022-12-11 ENCOUNTER — Inpatient Hospital Stay: Payer: Medicare HMO

## 2022-12-11 ENCOUNTER — Telehealth: Payer: Self-pay | Admitting: Genetic Counselor

## 2022-12-11 NOTE — Telephone Encounter (Signed)
Alexander [name] called requesting to cancel genetic testing appointment as he is not feeling well.  Offered to reschedule and/or conduct virtual visit.  Alexander [name] is not interested in proceeding with a genetic counseling visit.    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  Very briefly discussed moderate increased risk for colon cancer and encouraged family members to have genetic testing, as it could impact their colon cancer screening.  Per NCCN, individuals with the APC AJ increased risk allele are commended to have colonoscopies at least every 5 years, starting at the age of 42 (or 10 years before the youngest dx of colon cancer in the family, whichever is earlier).    Alexander [name] explained that he does not have any living biological relatives to share this information with.  Mailed genetic testing result to home address.  Contact information provided in case he has questions or wishes to reschedule in the future.

## 2022-12-12 ENCOUNTER — Inpatient Hospital Stay: Payer: Medicare HMO

## 2022-12-12 ENCOUNTER — Encounter: Payer: Self-pay | Admitting: *Deleted

## 2022-12-12 ENCOUNTER — Encounter: Payer: Self-pay | Admitting: Hematology & Oncology

## 2022-12-12 ENCOUNTER — Inpatient Hospital Stay: Payer: Medicare HMO | Admitting: Medical Oncology

## 2022-12-12 ENCOUNTER — Other Ambulatory Visit: Payer: Self-pay

## 2022-12-12 VITALS — BP 115/86 | HR 76 | Temp 98.3°F | Resp 18 | Ht 71.0 in | Wt 144.0 lb

## 2022-12-12 DIAGNOSIS — T383X5A Adverse effect of insulin and oral hypoglycemic [antidiabetic] drugs, initial encounter: Secondary | ICD-10-CM

## 2022-12-12 DIAGNOSIS — R634 Abnormal weight loss: Secondary | ICD-10-CM

## 2022-12-12 DIAGNOSIS — C251 Malignant neoplasm of body of pancreas: Secondary | ICD-10-CM | POA: Diagnosis not present

## 2022-12-12 DIAGNOSIS — C250 Malignant neoplasm of head of pancreas: Secondary | ICD-10-CM

## 2022-12-12 DIAGNOSIS — C252 Malignant neoplasm of tail of pancreas: Secondary | ICD-10-CM

## 2022-12-12 DIAGNOSIS — Z5111 Encounter for antineoplastic chemotherapy: Secondary | ICD-10-CM | POA: Diagnosis not present

## 2022-12-12 DIAGNOSIS — E160 Drug-induced hypoglycemia without coma: Secondary | ICD-10-CM

## 2022-12-12 LAB — CBC WITH DIFFERENTIAL (CANCER CENTER ONLY)
Abs Immature Granulocytes: 0.09 10*3/uL — ABNORMAL HIGH (ref 0.00–0.07)
Basophils Absolute: 0 10*3/uL (ref 0.0–0.1)
Basophils Relative: 0 %
Eosinophils Absolute: 0.1 10*3/uL (ref 0.0–0.5)
Eosinophils Relative: 1 %
HCT: 35.5 % — ABNORMAL LOW (ref 39.0–52.0)
Hemoglobin: 11.8 g/dL — ABNORMAL LOW (ref 13.0–17.0)
Immature Granulocytes: 1 %
Lymphocytes Relative: 20 %
Lymphs Abs: 1.6 10*3/uL (ref 0.7–4.0)
MCH: 33.7 pg (ref 26.0–34.0)
MCHC: 33.2 g/dL (ref 30.0–36.0)
MCV: 101.4 fL — ABNORMAL HIGH (ref 80.0–100.0)
Monocytes Absolute: 0.7 10*3/uL (ref 0.1–1.0)
Monocytes Relative: 9 %
Neutro Abs: 5.4 10*3/uL (ref 1.7–7.7)
Neutrophils Relative %: 69 %
Platelet Count: 211 10*3/uL (ref 150–400)
RBC: 3.5 MIL/uL — ABNORMAL LOW (ref 4.22–5.81)
RDW: 15.4 % (ref 11.5–15.5)
WBC Count: 8 10*3/uL (ref 4.0–10.5)
nRBC: 0 % (ref 0.0–0.2)

## 2022-12-12 LAB — CMP (CANCER CENTER ONLY)
ALT: 16 U/L (ref 0–44)
AST: 15 U/L (ref 15–41)
Albumin: 3.7 g/dL (ref 3.5–5.0)
Alkaline Phosphatase: 90 U/L (ref 38–126)
Anion gap: 8 (ref 5–15)
BUN: 27 mg/dL — ABNORMAL HIGH (ref 8–23)
CO2: 27 mmol/L (ref 22–32)
Calcium: 8.9 mg/dL (ref 8.9–10.3)
Chloride: 102 mmol/L (ref 98–111)
Creatinine: 1.25 mg/dL — ABNORMAL HIGH (ref 0.61–1.24)
GFR, Estimated: 57 mL/min — ABNORMAL LOW (ref >60)
Glucose, Bld: 254 mg/dL — ABNORMAL HIGH (ref 70–99)
Potassium: 4.5 mmol/L (ref 3.5–5.1)
Sodium: 137 mmol/L (ref 135–145)
Total Bilirubin: 0.7 mg/dL (ref <1.2)
Total Protein: 6.4 g/dL — ABNORMAL LOW (ref 6.5–8.1)

## 2022-12-12 MED ORDER — HEPARIN SOD (PORK) LOCK FLUSH 100 UNIT/ML IV SOLN
500.0000 [IU] | Freq: Once | INTRAVENOUS | Status: AC
Start: 2022-12-12 — End: 2022-12-12
  Administered 2022-12-12: 500 [IU] via INTRAVENOUS

## 2022-12-12 MED ORDER — SODIUM CHLORIDE 0.9% FLUSH
10.0000 mL | Freq: Once | INTRAVENOUS | Status: AC
Start: 2022-12-12 — End: 2022-12-12
  Administered 2022-12-12: 10 mL

## 2022-12-12 NOTE — Patient Instructions (Signed)

## 2022-12-12 NOTE — Progress Notes (Unsigned)
Hematology and Oncology Follow Up Visit  Alexander [name] [id number] June 16, 1940 82 y.o. 12/13/2022   Principle Diagnosis:  Adenocarcinoma of the pancreas-stage Ib (T2N0M0) --clinical stage --BRCA (-), KRAS (+), HRD(-)  Current Therapy:   Status post biliary stent placement FOLFIRINOX -- Neoadj - s/p cycle #2/4  - start on 10/31/2022     Interim History:  Alexander [name] is back for follow-up.   Thus far he has tolerated chemotherapy fairly well.   Currently this biggest concerns are his blood sugars which are dropping at night and his lack of taste for food. He is using his Libre device and states that it is waking him up nightly. Unfortunately this device and his glucometer can vary sometimes in their readings up to 40 points. He has discussed this with his provider and he has cut down his night time insulin to 5 units.   He has had no abdominal pain.  He has had no cough or shortness of breath.  He has had no nausea or vomiting.  There is been no mouth sores.  No neuropathy, hearing or visual changes.   He continues to stay active and walks 1-3 miles per day.   Overall, I would say his performance is probably ECOG 1. s .   Wt Readings from Last 3 Encounters:  12/13/22 144 lb (65.3 kg)  12/12/22 144 lb (65.3 kg)  11/21/22 143 lb (64.9 kg)    Medications:  Current Outpatient Medications:    Accu-Chek Softclix Lancets lancets, Use to test blood glucose once daily, Disp: 100 each, Rfl: 3   amLODipine (NORVASC) 10 MG tablet, TAKE ONE TABLET BY MOUTH DAILY, Disp: 90 tablet, Rfl: 3   Blood Glucose Calibration (EMBRACE PRO GLUCOSE CONTROL) LIQD, Use to check blood glucose TID, Disp: 1 each, Rfl:    Blood Glucose Monitoring Suppl (ACCU-CHEK AVIVA PLUS) w/Device KIT, Use to test blood glucose once daily, Disp: 1 kit, Rfl: 0   Continuous Glucose Sensor (FREESTYLE LIBRE 3 SENSOR) MISC, 1 Device by Does not apply route every 14 (fourteen) days. Place 1 sensor on the skin every 14 days.  Use to check glucose continuously, Disp: 2 each, Rfl: 6   glucose blood (EMBRACE PRO GLUCOSE TEST) test strip, Use as instructed, Disp: 100 each, Rfl: 12   insulin glargine (LANTUS) 100 UNIT/ML injection, Inject 0.2 mLs (20 Units total) into the skin 2 (two) times daily., Disp: 12 mL, Rfl: 1   Insulin Pen Needle 29G X MISC, Use daily for insulin pen, Disp: 90 each, Rfl: 1   insulin regular (HUMULIN R) 100 units/mL injection, Inject 0.05 mLs (5 Units total) into the skin 3 (three) times daily before meals., Disp: 10 mL, Rfl: 11   Insulin Syringe-Needle U-100 (INSULIN SYRINGE .3CC/30GX5/16") 30G X 5/16" 0.3 ML MISC, Use for insulin administration three times a day, Disp: 300 each, Rfl: 3   lipase/protease/amylase (CREON) 36000 UNITS CPEP capsule, Take 2 capsules (72,000 Units total) by mouth 3 (three) times daily with meals. May also take 1 capsule (36,000 Units total) as needed (with snacks - up to 4 snacks daily)., Disp: 180 capsule, Rfl: 5   loperamide (IMODIUM) 2 MG capsule, Take 2 tabs by mouth with first loose stool, then 1 tab with each additional loose stool as needed. Do not exceed 8 tabs in a 24-hour period, Disp: 100 capsule, Rfl: 3   OLANZapine (ZYPREXA) 5 MG tablet, Take 1 tablet (5 mg total) by mouth at bedtime., Disp: 30 tablet, Rfl: 4  ondansetron (ZOFRAN) 8 MG tablet, Take 1 tablet (8 mg total) by mouth every 8 (eight) hours as needed for nausea or vomiting. Start on the third day after chemotherapy, Disp: 30 tablet, Rfl: 1   pantoprazole (PROTONIX) 40 MG tablet, Take 1 tablet (40 mg total) by mouth 2 (two) times daily before a meal., Disp: 60 tablet, Rfl: 6   prochlorperazine (COMPAZINE) 10 MG tablet, Take 1 tablet (10 mg total) by mouth every 6 (six) hours as needed for nausea or vomiting (Nausea or vomiting)., Disp: 30 tablet, Rfl: 1  Allergies:  Allergies  Allergen Reactions   Metformin And Related Diarrhea   Metformin Hcl Diarrhea    Past Medical History, Surgical  history, Social history, and Family History were reviewed and updated.  Review of Systems: Review of Systems  Constitutional:  Negative for unexpected weight change.  HENT:  Negative.    Eyes:  Negative for icterus.  Respiratory: Negative.    Cardiovascular: Negative.   Gastrointestinal: Negative.   Endocrine: Negative.   Genitourinary: Negative.    Musculoskeletal: Negative.   Skin:  Negative for rash.  Neurological: Negative.   Hematological: Negative.   Psychiatric/Behavioral: Negative.      Physical Exam:  height is 5\' 11"  (1.803 m) and weight is 144 lb (65.3 kg). His oral temperature is 98.3 F (36.8 C). His blood pressure is 115/86 and his pulse is 76. His respiration is 18 and oxygen saturation is 100%.   Wt Readings from Last 3 Encounters:  12/13/22 144 lb (65.3 kg)  12/12/22 144 lb (65.3 kg)  11/21/22 143 lb (64.9 kg)    Physical Exam Vitals reviewed.  HENT:     Head: Normocephalic and atraumatic.  Eyes:     Pupils: Pupils are equal, round, and reactive to light.     Comments: Ocular exam does show some slight scleral icterus.  Cardiovascular:     Rate and Rhythm: Normal rate and regular rhythm.     Heart sounds: Normal heart sounds.  Pulmonary:     Effort: Pulmonary effort is normal.     Breath sounds: Normal breath sounds.  Abdominal:     General: Bowel sounds are normal.     Palpations: Abdomen is soft.     Comments: Abdominal exam is soft.  He has good bowel sounds.  There is no fluid wave.  There is no obvious abdominal mass.  There is no palpable hepatosplenomegaly.  Musculoskeletal:        General: No tenderness or deformity. Normal range of motion.     Cervical back: Normal range of motion.  Lymphadenopathy:     Cervical: No cervical adenopathy.  Skin:    General: Skin is warm and dry.     Findings: No erythema or rash.     Comments: Skin exam does not show any obvious rashes.  He has some jaundice.  Neurological:     Mental Status: He is alert  and oriented to person, place, and time.  Psychiatric:        Behavior: Behavior normal.        Thought Content: Thought content normal.        Judgment: Judgment normal.      Lab Results  Component Value Date   WBC 8.0 12/12/2022   HGB 11.8 (L) 12/12/2022   HCT 35.5 (L) 12/12/2022   MCV 101.4 (H) 12/12/2022   PLT 211 12/12/2022     Chemistry      Component Value Date/Time   NA  137 12/12/2022 0957   NA 144 11/29/2016 1054   NA 139 12/29/2015 0954   K 4.5 12/12/2022 0957   K 4.2 11/29/2016 1054   K 4.1 12/29/2015 0954   CL 102 12/12/2022 0957   CL 102 11/29/2016 1054   CO2 27 12/12/2022 0957   CO2 30 11/29/2016 1054   CO2 26 12/29/2015 0954   BUN 27 (H) 12/12/2022 0957   BUN 22 11/29/2016 1054   BUN 20.0 12/29/2015 0954   CREATININE 1.25 (H) 12/12/2022 0957   CREATININE 1.2 11/29/2016 1054   CREATININE 1.3 12/29/2015 0954      Component Value Date/Time   CALCIUM 8.9 12/12/2022 0957   CALCIUM 9.3 11/29/2016 1054   CALCIUM 9.5 12/29/2015 0954   ALKPHOS 90 12/12/2022 0957   ALKPHOS 92 (H) 11/29/2016 1054   ALKPHOS 98 12/29/2015 0954   AST 15 12/12/2022 0957   AST 16 12/29/2015 0954   ALT 16 12/12/2022 0957   ALT 20 11/29/2016 1054   ALT 13 12/29/2015 0954   BILITOT 0.7 12/12/2022 0957   BILITOT 0.71 12/29/2015 0954     Encounter Diagnoses  Name Primary?   Weight loss, abnormal Yes   Malignant neoplasm of head of pancreas (HCC)    Malignant neoplasm of body of pancreas (HCC)    Malignant neoplasm of tail of pancreas (HCC)    Impression and Plan: Mr. Frerichs is a very nice 82 year old white male.  Again, we have been seeing him for polycythemia vera.  Now, his problem is he has early stage pancreatic cancer.  Overall he is struggling with nocturnal hypoglycemia. He will stop his lantus in the evening. I have also suggested that he and his PCP discuss a sliding scale for meals- fortunately he has an appointment with them tomorrow. We also discussed trying to  avoid over correcting his hypoglycemia and the importance of double checking his readings with both meters given the inconsistencies.   We also discussed ways to help him meet his nutritional needs with his taste changes. Hopefully this will help him. I have also suggested a nutritional consult which he defers at the moment.   RTC 12/31 as planned for MD, port labs, treatment #3   I spent 25 minutes dedicated to the care of this patient (face to face and non-face to face) on the date of this encounter to include the diabetic education stated above.   Rushie Chestnut, PA-C 12/11/20245:38 PM

## 2022-12-13 ENCOUNTER — Ambulatory Visit: Payer: Medicare HMO | Admitting: Adult Health

## 2022-12-13 ENCOUNTER — Encounter: Payer: Self-pay | Admitting: Adult Health

## 2022-12-13 ENCOUNTER — Encounter: Payer: Self-pay | Admitting: Hematology & Oncology

## 2022-12-13 VITALS — BP 130/80 | HR 96 | Temp 98.2°F | Ht 71.0 in | Wt 144.0 lb

## 2022-12-13 DIAGNOSIS — E1169 Type 2 diabetes mellitus with other specified complication: Secondary | ICD-10-CM | POA: Diagnosis not present

## 2022-12-13 DIAGNOSIS — C250 Malignant neoplasm of head of pancreas: Secondary | ICD-10-CM | POA: Diagnosis not present

## 2022-12-13 DIAGNOSIS — Z794 Long term (current) use of insulin: Secondary | ICD-10-CM

## 2022-12-13 LAB — CANCER ANTIGEN 19-9: CA 19-9: 101 U/mL — ABNORMAL HIGH (ref 0–35)

## 2022-12-13 NOTE — Progress Notes (Signed)
Subjective:    Patient ID: Alexander [name], male    DOB: 1940-03-03, 82 y.o.   MRN: [id number]  HPI 82 year old male who  has a past medical history of Arthritis, CAD (coronary artery disease), Diverticulosis of colon, GERD (gastroesophageal reflux disease), GI bleed, Gout, Hematuria, microscopic, Hemorrhoids, Hyperglycemia, Hyperlipidemia, Hypertension, Lumbar back pain, Microalbuminuria, Overweight(278.02), Pancreatic cancer (HCC) (09/22/2022), Polycythemia rubra vera (HCC), and Tubular adenoma of colon (07/2012).  He is being evaluated today for follow-up regarding diabetes mellitus.  Currently maintained with Lantus 20 units twice daily and Humulin R 5 units in the morning and at lunchtime.  We had to discontinue his evening dose due to low blood sugars in the middle of the night, this is since resolved.  Does report that his blood sugars have been up and down, currently in the office his blood sugar is 117 and he has not had any insulin since yesterday afternoon.  His 90-day average view the [place] sensor is 219.  He is Ingal roughly 54% of the time.  He does have an appointment with endocrinology tomorrow morning.  He is currently going through treatment for pancreatic cancer and has been trying to gain weight so he has not been following a specific diet.  He does eat a lot of sugars but as of his second chemo treatment his appetite has diminished.  He feels hungry but anytime he put something in his mouth he has a hard time eating it and sometimes has to force himself to eat.  Review of Systems See HPI   Past Medical History:  Diagnosis Date   Arthritis    "minor; shoulders primarily" (05/20/2013)   CAD (coronary artery disease)    a. 05/2013 - Chest pain/unstable angina and dynamic EKG changes showing cath showing atherosclerotic coronary artery disease manifested as diffuse ectasia, normal EF.   Diverticulosis of colon    GERD (gastroesophageal reflux disease)    GI bleed     secondary to Aspirin   Gout    Hematuria, microscopic    Hemorrhoids    Hyperglycemia    Hyperlipidemia    Hypertension    Lumbar back pain    Microalbuminuria    Overweight(278.02)    Pancreatic cancer (HCC) 09/22/2022   Polycythemia rubra vera (HCC)    Tubular adenoma of colon 07/2012    Social History   Socioeconomic History   Marital status: Married    Spouse name: Not on file   Number of children: 3   Years of education: Not on file   Highest education level: Some college, no degree  Occupational History   Occupation: retired Garment/textile technologist: RETIRED  Tobacco Use   Smoking status: Never   Smokeless tobacco: Never   Tobacco comments:    NEVER USED TOBACCO  Vaping Use   Vaping status: Never Used  Substance and Sexual Activity   Alcohol use: No    Alcohol/week: 0.0 standard drinks of alcohol   Drug use: No   Sexual activity: Yes    Partners: Female  Other Topics Concern   Not on file  Social History Narrative   Marriedfor 52 years    Daughter, Physicist, medical (pediatrician--lives in [place], [place]), one in [place] - Warden/ranger. One in [place] - Chartered loss adjuster          Social Determinants of Health   Financial Resource Strain: Low Risk  (10/16/2022)   Overall Financial Resource Strain (CARDIA)    Difficulty of Paying Living Expenses: Not  hard at all  Food Insecurity: No Food Insecurity (11/08/2022)   Hunger Vital Sign    Worried About Running Out of Food in the Last Year: Never true    Ran Out of Food in the Last Year: Never true  Transportation Needs: No Transportation Needs (11/08/2022)   PRAPARE - Administrator, Civil Service (Medical): No    Lack of Transportation (Non-Medical): No  Physical Activity: Sufficiently Active (10/16/2022)   Exercise Vital Sign    Days of Exercise per Week: 3 days    Minutes of Exercise per Session: 60 min  Stress: No Stress Concern Present (10/16/2022)   [employer] of Occupational Health - Occupational Stress  Questionnaire    Feeling of Stress : Only a little  Social Connections: Socially Integrated (10/16/2022)   Social Connection and Isolation Panel [NHANES]    Frequency of Communication with Friends and Family: More than three times a week    Frequency of Social Gatherings with Friends and Family: More than three times a week    Attends Religious Services: 1 to 4 times per year    Active Member of [employer] or Organizations: Yes    Attends Banker Meetings: Never    Marital Status: Married  Catering manager Violence: Not At Risk (11/08/2022)   Humiliation, Afraid, Rape, and Kick questionnaire    Fear of Current or Ex-Partner: No    Emotionally Abused: No    Physically Abused: No    Sexually Abused: No    Past Surgical History:  Procedure Laterality Date   BILIARY STENT PLACEMENT N/A 09/02/2022   Procedure: BILIARY STENT PLACEMENT;  Surgeon: Hilarie Fredrickson, MD;  Location: [facility];  Service: Gastroenterology;  Laterality: N/A;   BILIARY STENT PLACEMENT  10/13/2022   Procedure: BILIARY STENT PLACEMENT;  Surgeon: Meryl Dare, MD;  Location: [facility] ENDOSCOPY;  Service: Gastroenterology;;   BIOPSY  09/11/2022   Procedure: BIOPSY;  Surgeon: Lemar Lofty., MD;  Location: [facility] ENDOSCOPY;  Service: Gastroenterology;;   CARDIAC CATHETERIZATION  05/20/2013   CATARACT EXTRACTION W/ INTRAOCULAR LENS  IMPLANT, BILATERAL Bilateral 2008   CYSTECTOMY  ~ 2000   " SEBACEOUS cyst removed from between shoulder blades"   ENDOSCOPIC RETROGRADE CHOLANGIOPANCREATOGRAPHY (ERCP) WITH PROPOFOL N/A 09/02/2022   Procedure: ENDOSCOPIC RETROGRADE CHOLANGIOPANCREATOGRAPHY (ERCP) WITH PROPOFOL;  Surgeon: Hilarie Fredrickson, MD;  Location: [facility];  Service: Gastroenterology;  Laterality: N/A;   ENDOSCOPIC RETROGRADE CHOLANGIOPANCREATOGRAPHY (ERCP) WITH PROPOFOL N/A 10/13/2022   Procedure: ENDOSCOPIC RETROGRADE CHOLANGIOPANCREATOGRAPHY (ERCP) WITH PROPOFOL;  Surgeon: Meryl Dare, MD;  Location: [facility]  ENDOSCOPY;  Service: Gastroenterology;  Laterality: N/A;   ESOPHAGOGASTRODUODENOSCOPY N/A 09/11/2022   Procedure: ESOPHAGOGASTRODUODENOSCOPY (EGD);  Surgeon: Lemar Lofty., MD;  Location: [facility];  Service: Gastroenterology;  Laterality: N/A;   EUS N/A 09/11/2022   Procedure: UPPER ENDOSCOPIC ULTRASOUND (EUS) RADIAL;  Surgeon: Lemar Lofty., MD;  Location: [facility] ENDOSCOPY;  Service: Gastroenterology;  Laterality: N/A;   EXCISIONAL HEMORRHOIDECTOMY  1970's   FINE NEEDLE ASPIRATION N/A 09/11/2022   Procedure: FINE NEEDLE ASPIRATION (FNA) LINEAR;  Surgeon: Lemar Lofty., MD;  Location: [facility] ENDOSCOPY;  Service: Gastroenterology;  Laterality: N/A;   INGUINAL HERNIA REPAIR Right ~ 2010   IR IMAGING GUIDED PORT INSERTION  10/27/2022   LEFT HEART CATHETERIZATION WITH CORONARY ANGIOGRAM N/A 05/20/2013   Procedure: LEFT HEART CATHETERIZATION WITH CORONARY ANGIOGRAM;  Surgeon: Peter M Swaziland, MD;  Location: [facility] CATH LAB;  Service: Cardiovascular;  Laterality: N/A;   SPHINCTEROTOMY  09/02/2022  Procedure: SPHINCTEROTOMY;  Surgeon: Hilarie Fredrickson, MD;  Location: [facility]OS[facility];  Service: Gastroenterology;;   Francine Graven REMOVAL  10/13/2022   Procedure: STENT REMOVAL;  Surgeon: Meryl Dare, MD;  [facility];  Service: Gastroenterology;;   TONSILLECTOMY AND ADENOIDECTOMY  1940's   VASECTOMY      Family History  Problem Relation Age of Onset   Colon cancer Mother        died at 64, colorectal   Diabetes Mother    Heart disease Father 24       MI   Diabetes Father    Stomach cancer Maternal Grandfather    Cancer Maternal Uncle     Allergies  Allergen Reactions   Metformin And Related Diarrhea   Metformin Hcl Diarrhea    Current Outpatient Medications on File Prior to Visit  Medication Sig Dispense Refill   Accu-Chek Softclix Lancets lancets Use to test blood glucose once daily 100 each 3   amLODipine (NORVASC) 10 MG tablet TAKE ONE TABLET BY MOUTH DAILY 90 tablet  3   Blood Glucose Calibration (EMBRACE PRO GLUCOSE CONTROL) LIQD Use to check blood glucose TID 1 each    Blood Glucose Monitoring Suppl (ACCU-CHEK AVIVA PLUS) w/Device KIT Use to test blood glucose once daily 1 kit 0   Continuous Glucose Sensor (FREESTYLE LIBRE 3 SENSOR) MISC 1 Device by Does not apply route every 14 (fourteen) days. Place 1 sensor on the skin every 14 days. Use to check glucose continuously 2 each 6   glucose blood (EMBRACE PRO GLUCOSE TEST) test strip Use as instructed 100 each 12   insulin glargine (LANTUS) 100 UNIT/ML injection Inject 0.2 mLs (20 Units total) into the skin 2 (two) times daily. 12 mL 1   Insulin Pen Needle 29G X MISC Use daily for insulin pen 90 each 1   insulin regular (HUMULIN R) 100 units/mL injection Inject 0.05 mLs (5 Units total) into the skin 3 (three) times daily before meals. 10 mL 11   Insulin Syringe-Needle U-100 (INSULIN SYRINGE .3CC/30GX5/16") 30G X 5/16" 0.3 ML MISC Use for insulin administration three times a day 300 each 3   lipase/protease/amylase (CREON) 36000 UNITS CPEP capsule Take 2 capsules (72,000 Units total) by mouth 3 (three) times daily with meals. May also take 1 capsule (36,000 Units total) as needed (with snacks - up to 4 snacks daily). 180 capsule 5   loperamide (IMODIUM) 2 MG capsule Take 2 tabs by mouth with first loose stool, then 1 tab with each additional loose stool as needed. Do not exceed 8 tabs in a 24-hour period 100 capsule 3   OLANZapine (ZYPREXA) 5 MG tablet Take 1 tablet (5 mg total) by mouth at bedtime. 30 tablet 4   ondansetron (ZOFRAN) 8 MG tablet Take 1 tablet (8 mg total) by mouth every 8 (eight) hours as needed for nausea or vomiting. Start on the third day after chemotherapy 30 tablet 1   pantoprazole (PROTONIX) 40 MG tablet Take 1 tablet (40 mg total) by mouth 2 (two) times daily before a meal. 60 tablet 6   prochlorperazine (COMPAZINE) 10 MG tablet Take 1 tablet (10 mg total) by mouth every 6 (six) hours as  needed for nausea or vomiting (Nausea or vomiting). 30 tablet 1   No current facility-administered medications on file prior to visit.    BP 130/80   Pulse 96   Temp 98.2 F (36.8 C) (Oral)   Ht 5\' 11"  (1.803 m)   Wt 144  lb (65.3 kg)   SpO2 (!) 68%   BMI 20.08 kg/m       Objective:   Physical Exam Vitals and nursing note reviewed.  Constitutional:      Appearance: Normal appearance. He is underweight.  Cardiovascular:     Rate and Rhythm: Normal rate and regular rhythm.     Pulses: Normal pulses.     Heart sounds: Normal heart sounds.  Pulmonary:     Effort: Pulmonary effort is normal.     Breath sounds: Normal breath sounds.  Musculoskeletal:        General: Normal range of motion.  Skin:    General: Skin is warm and dry.  Neurological:     General: No focal deficit present.     Mental Status: He is alert and oriented to person, place, and time.  Psychiatric:        Mood and Affect: Mood normal.        Behavior: Behavior normal.        Thought Content: Thought content normal.        Judgment: Judgment normal.         Assessment & Plan:  1. Type 2 diabetes mellitus with other specified complication, with long-term current use of insulin (HCC) -I am not going to change anything on his insulin regimen today since he will be seen by endocrinology tomorrow.  Did encouraged foods higher in protein and lower in sugar and carbs.  2. Malignant neoplasm of head of pancreas (HCC) - Continue treatment plan by Oncology.  - Cancer Antigen continues to fall, most recent from yesterday was 101   Shirline Frees, NP  Time spent with patient today was 31 minutes which consisted of chart review, discussing DM Type 2 and treatment for pancreatic cancer, work up, treatment answering questions and documentation.

## 2022-12-14 ENCOUNTER — Encounter: Payer: Self-pay | Admitting: Endocrinology

## 2022-12-14 ENCOUNTER — Inpatient Hospital Stay: Payer: Medicare HMO

## 2022-12-14 ENCOUNTER — Encounter: Payer: Self-pay | Admitting: Adult Health

## 2022-12-14 ENCOUNTER — Ambulatory Visit: Payer: Medicare HMO | Admitting: Endocrinology

## 2022-12-14 ENCOUNTER — Encounter: Payer: Self-pay | Admitting: Hematology & Oncology

## 2022-12-14 VITALS — BP 136/60 | HR 86 | Resp 20 | Ht 71.0 in | Wt 143.0 lb

## 2022-12-14 DIAGNOSIS — E160 Drug-induced hypoglycemia without coma: Secondary | ICD-10-CM

## 2022-12-14 DIAGNOSIS — E1165 Type 2 diabetes mellitus with hyperglycemia: Secondary | ICD-10-CM | POA: Diagnosis not present

## 2022-12-14 DIAGNOSIS — Z794 Long term (current) use of insulin: Secondary | ICD-10-CM

## 2022-12-14 DIAGNOSIS — T383X5A Adverse effect of insulin and oral hypoglycemic [antidiabetic] drugs, initial encounter: Secondary | ICD-10-CM | POA: Diagnosis not present

## 2022-12-14 DIAGNOSIS — I10 Essential (primary) hypertension: Secondary | ICD-10-CM

## 2022-12-14 DIAGNOSIS — K219 Gastro-esophageal reflux disease without esophagitis: Secondary | ICD-10-CM

## 2022-12-14 DIAGNOSIS — E119 Type 2 diabetes mellitus without complications: Secondary | ICD-10-CM

## 2022-12-14 LAB — POCT GLYCOSYLATED HEMOGLOBIN (HGB A1C): Hemoglobin A1C: 6.2 % — AB (ref 4.0–5.6)

## 2022-12-14 MED ORDER — INSULIN LISPRO (1 UNIT DIAL) 100 UNIT/ML (KWIKPEN): PEN_INJECTOR | SUBCUTANEOUS | 11 refills | Status: DC

## 2022-12-14 MED ORDER — INSULIN PEN NEEDLE 32G X 4 MM MISC: 3 refills | Status: DC

## 2022-12-14 MED ORDER — INSULIN GLARGINE 100 UNIT/ML SOLOSTAR PEN: PEN_INJECTOR | SUBCUTANEOUS | 4 refills | Status: DC

## 2022-12-14 MED ORDER — GVOKE HYPOPEN 1-PACK 1 MG/0.2ML ~~LOC~~ SOAJ: 1.0000 mg | SUBCUTANEOUS | 2 refills | Status: DC | PRN

## 2022-12-14 NOTE — Progress Notes (Signed)
Patient has good treatment tolerance. He will continue as planned.   Oncology Nurse Navigator Documentation     12/12/2022   10:30 AM  Oncology Nurse Navigator Flowsheets  Navigator Follow Up Date: 01/02/2023  Navigator Follow Up Reason: Follow-up Appointment;Chemotherapy  Navigator Location [facility]  Navigator Encounter Type Follow-up Appt  Patient Visit Type MedOnc  Treatment Phase Active Tx  Barriers/Navigation Needs Coordination of Care;Education  Interventions Psycho-Social Support  Acuity Level 2-Minimal Needs (1-2 Barriers Identified)  Support Groups/Services Friends and Family  Time Spent with Patient 15

## 2022-12-14 NOTE — Progress Notes (Signed)
Outpatient Endocrinology Note [place] Dejah Droessler, MD  12/14/22  Patient's Name: Alexander [name]    DOB: 01-17-1940    MRN: [id number]                                                    REASON OF VISIT: New consult for type 2 diabetes mellitus  REFERRING PROVIDER: Shirline Frees, NP  PCP: Shirline Frees, NP  HISTORY OF PRESENT ILLNESS:   Alexander [name] is a 82 y.o. old male with past medical history listed below, is here for new consult for type 2 diabetes mellitus.   Pertinent Diabetes History: Patient has type 2 diabetes mellitus, based on hemoglobin A1c 6.6% at least from 2008, was controlled with mostly hemoglobin A1c in the range of 5.6 to 6.4% for several year, mostly in prediabetes range on lifestyle modification.  In March 2024 he had hemoglobin A1c elevated to 8.1%, was started on glipizide however later he stopped due to patient concern of weight loss after few weeks.  He had tried metformin in the past for short time, stopped due to GI intolerance/diarrhea.    He was diagnosed with adenocarcinoma of pancreas in September 2024, is being treated with neoadjuvant chemotherapy, with a plan to follow-up surgery in the future.  Patient had received dexamethasone during the chemotherapy cycle, most recent dexamethasone was on December 3.  During the course of pancreatic cancer treatment he was started on insulin therapy in October 2024, he is currently on multidose insulin regimen and referred to endocrinology for further evaluation and management of type 2 diabetes mellitus.  She has been seeing dietitian/nutritionist as well.  Family h/o diabetes mellitus: mother and father had type 2 diabetes mellitus.[name]    He was started on Lantus 20 units 2 times a day and Humulin R 5 units with meals 3 times a day.  Lantus evening dose was gradually decreased, was taking 10 units until 2 days ago and he stopped.  He continued on Lantus 20 units in the morning only.  He has been on continuous  glucose monitoring freestyle libre 3 and reviewed as follows.  Chronic Diabetes Complications : Retinopathy: Unknown. Last ophthalmology exam was done on ? Nephropathy: no Peripheral neuropathy: no Coronary artery disease: no Stroke: no  Relevant comorbidities and cardiovascular risk factors: Obesity: no Body mass index is 19.94 kg/m.  Hypertension: Yes  Hyperlipidemia : Yes, on statin   Current / Home Diabetic regimen includes:  Lantus 20 units in the morning daily.   Humulin R units 5 units with meals 3 times a day.   Prior diabetic medications: Glipizide, was treated in March 2024.  Metformin had GI intolerance with diarrhea in the past.  Glycemic data:    CONTINUOUS GLUCOSE MONITORING SYSTEM (CGMS) INTERPRETATION: At today's visit, we reviewed CGM downloads. The full report is scanned in the media. Reviewing the CGM trends, blood glucose are as follows:  FreeStyle Libre 3 CGM-  Sensor Download (Sensor download was reviewed and summarized below.) Dates: November 29 to December 14, 2022, 14 days Sensor Average: 157  Glucose Management Indicator: 7.1%    Interpretation: Patient had intermittent hypoglycemia with blood sugar in the range of 50-60 in between the meals, early morning.  He had hyperglycemia sometime up to 300 range related with steroid/dexamethasone use and also related with correction for hypoglycemia and  related to meals postprandially.  Some of the times blood sugar are acceptable in between the meals and overnight.  Lantus evening dose was gradually decreased and was stopped 2 days ago.  Yesterday he had taken Lantus 20 units in the morning and did not take in the evening, this morning blood sugar and overnight are in the normal range in the 90s.  Hypoglycemia: Patient has frequent hypoglycemic episodes. Patient has hypoglycemia awareness.  Factors modifying glucose control: 1.  Diabetic diet assessment: Small and variable meals, loss of appetite with  recent diagnosis of pancreatic cancer on chemotherapy, unpredictable eating.  2.  Staying active or exercising: none  3.  Medication compliance: compliant all of the time.  Interval history  Patient presented for the evaluation and management of type 2 diabetes mellitus.  Patient is accompanied by his wife in the clinic today.  REVIEW OF SYSTEMS As per history of present illness.   PAST MEDICAL HISTORY: Past Medical History:  Diagnosis Date   Arthritis    "minor; shoulders primarily" (05/20/2013)   CAD (coronary artery disease)    a. 05/2013 - Chest pain/unstable angina and dynamic EKG changes showing cath showing atherosclerotic coronary artery disease manifested as diffuse ectasia, normal EF.   Diverticulosis of colon    GERD (gastroesophageal reflux disease)    GI bleed    secondary to Aspirin   Gout    Hematuria, microscopic    Hemorrhoids    Hyperglycemia    Hyperlipidemia    Hypertension    Lumbar back pain    Microalbuminuria    Overweight(278.02)    Pancreatic cancer (HCC) 09/22/2022   Polycythemia rubra vera (HCC)    Tubular adenoma of colon 07/2012    PAST SURGICAL HISTORY: Past Surgical History:  Procedure Laterality Date   BILIARY STENT PLACEMENT N/A 09/02/2022   Procedure: BILIARY STENT PLACEMENT;  Surgeon: Hilarie Fredrickson, MD;  Location: [facility];  Service: Gastroenterology;  Laterality: N/A;   BILIARY STENT PLACEMENT  10/13/2022   Procedure: BILIARY STENT PLACEMENT;  Surgeon: Meryl Dare, MD;  Location: [facility] ENDOSCOPY;  Service: Gastroenterology;;   BIOPSY  09/11/2022   Procedure: BIOPSY;  Surgeon: Lemar Lofty., MD;  Location: [facility] ENDOSCOPY;  Service: Gastroenterology;;   CARDIAC CATHETERIZATION  05/20/2013   CATARACT EXTRACTION W/ INTRAOCULAR LENS  IMPLANT, BILATERAL Bilateral 2008   CYSTECTOMY  ~ 2000   " SEBACEOUS cyst removed from between shoulder blades"   ENDOSCOPIC RETROGRADE CHOLANGIOPANCREATOGRAPHY (ERCP) WITH PROPOFOL N/A 09/02/2022    Procedure: ENDOSCOPIC RETROGRADE CHOLANGIOPANCREATOGRAPHY (ERCP) WITH PROPOFOL;  Surgeon: Hilarie Fredrickson, MD;  Location: [facility];  Service: Gastroenterology;  Laterality: N/A;   ENDOSCOPIC RETROGRADE CHOLANGIOPANCREATOGRAPHY (ERCP) WITH PROPOFOL N/A 10/13/2022   Procedure: ENDOSCOPIC RETROGRADE CHOLANGIOPANCREATOGRAPHY (ERCP) WITH PROPOFOL;  Surgeon: Meryl Dare, MD;  Location: [facility] ENDOSCOPY;  Service: Gastroenterology;  Laterality: N/A;   ESOPHAGOGASTRODUODENOSCOPY N/A 09/11/2022   Procedure: ESOPHAGOGASTRODUODENOSCOPY (EGD);  Surgeon: Lemar Lofty., MD;  Location: [facility];  Service: Gastroenterology;  Laterality: N/A;   EUS N/A 09/11/2022   Procedure: UPPER ENDOSCOPIC ULTRASOUND (EUS) RADIAL;  Surgeon: Lemar Lofty., MD;  Location: [facility] ENDOSCOPY;  Service: Gastroenterology;  Laterality: N/A;   EXCISIONAL HEMORRHOIDECTOMY  1970's   FINE NEEDLE ASPIRATION N/A 09/11/2022   Procedure: FINE NEEDLE ASPIRATION (FNA) LINEAR;  Surgeon: Lemar Lofty., MD;  Location: [facility] ENDOSCOPY;  Service: Gastroenterology;  Laterality: N/A;   INGUINAL HERNIA REPAIR Right ~ 2010   IR IMAGING GUIDED PORT INSERTION  10/27/2022   LEFT  HEART CATHETERIZATION WITH CORONARY ANGIOGRAM N/A 05/20/2013   Procedure: LEFT HEART CATHETERIZATION WITH CORONARY ANGIOGRAM;  Surgeon: Peter M Swaziland, MD;  Location[facility] C[facility] LAB;  Service: Cardiovascular;  Laterality: N/A;   SPHINCTEROTOMY  09/02/2022   Procedure: SPHINCTEROTOMY;  Surgeon: Hilarie Fredrickson, MD;  Location: [facility];  Service: Gastroenterology;;   Francine Graven REMOVAL  10/13/2022   Procedure: STENT REMOVAL;  Surgeon: Meryl Dare, MD;  Location: [facility] ENDOSCOPY;  Service: Gastroenterology;;   TONSILLECTOMY AND ADENOIDECTOMY  1940's   VASECTOMY      ALLERGIES: Allergies  Allergen Reactions   Metformin And Related Diarrhea   Metformin Hcl Diarrhea    FAMILY HISTORY:  Family History  Problem Relation Age of Onset   Colon cancer Mother         died at 57, colorectal   Diabetes Mother    Heart disease Father 11       MI   Diabetes Father    Stomach cancer Maternal Grandfather    Cancer Maternal Uncle     SOCIAL HISTORY: Social History   Socioeconomic History   Marital status: Married    Spouse name: Not on file   Number of children: 3   Years of education: Not on file   Highest education level: Some college, no degree  Occupational History   Occupation: retired Garment/textile technologist: RETIRED  Tobacco Use   Smoking status: Never   Smokeless tobacco: Never   Tobacco comments:    NEVER USED TOBACCO  Vaping Use   Vaping status: Never Used  Substance and Sexual Activity   Alcohol use: No    Alcohol/week: 0.0 standard drinks of alcohol   Drug use: No   Sexual activity: Yes    Partners: Female  Other Topics Concern   Not on file  Social History Narrative   Marriedfor 52 years    Daughter, Physicist, medical (pediatrician--lives in [place], [place]), one in [place] - Warden/ranger. One in [place] - Chartered loss adjuster          Social Drivers of Health   Financial Resource Strain: Low Risk  (10/16/2022)   Overall Financial Resource Strain (CARDIA)    Difficulty of Paying Living Expenses: Not hard at all  Food Insecurity: No Food Insecurity (11/08/2022)   Hunger Vital Sign    Worried About Running Out of Food in the Last Year: Never true    Ran Out of Food in the Last Year: Never true  Transportation Needs: No Transportation Needs (11/08/2022)   PRAPARE - Administrator, Civil Service (Medical): No    Lack of Transportation (Non-Medical): No  Physical Activity: Sufficiently Active (10/16/2022)   Exercise Vital Sign    Days of Exercise per Week: 3 days    Minutes of Exercise per Session: 60 min  Stress: No Stress Concern Present (10/16/2022)   [employer] of Occupational Health - Occupational Stress Questionnaire    Feeling of Stress : Only a little  Social Connections: Socially Integrated (10/16/2022)   Social  Connection and Isolation Panel [NHANES]    Frequency of Communication with Friends and Family: More than three times a week    Frequency of Social Gatherings with Friends and Family: More than three times a week    Attends Religious Services: 1 to 4 times per year    Active Member of [employer] or Organizations: Yes    Attends Banker Meetings: Never    Marital Status: Married    MEDICATIONS:  Current Outpatient  Medications  Medication Sig Dispense Refill   Accu-Chek Softclix Lancets lancets Use to test blood glucose once daily 100 each 3   amLODipine (NORVASC) 10 MG tablet TAKE ONE TABLET BY MOUTH DAILY 90 tablet 3   Blood Glucose Calibration (EMBRACE PRO GLUCOSE CONTROL) LIQD Use to check blood glucose TID 1 each    Blood Glucose Monitoring Suppl (ACCU-CHEK AVIVA PLUS) w/Device KIT Use to test blood glucose once daily 1 kit 0   Continuous Glucose Sensor (FREESTYLE LIBRE 3 SENSOR) MISC 1 Device by Does not apply route every 14 (fourteen) days. Place 1 sensor on the skin every 14 days. Use to check glucose continuously 2 each 6   glucose blood (EMBRACE PRO GLUCOSE TEST) test strip Use as instructed 100 each 12   insulin glargine (LANTUS) 100 UNIT/ML Solostar Pen 16 units daily in regular days and 20 units on chemo days for 3 days after dexamethasone. Maxmium 20 units / day. 15 mL 4   insulin lispro (HUMALOG KWIKPEN) 100 UNIT/ML KwikPen 2-3 units with meals 3 times a day plus sliding scale as instructed, maximum 20 units / day. 15 mL 11   Insulin Pen Needle 32G X 4 MM MISC Use 4x a day 300 each 3   Insulin Syringe-Needle U-100 (INSULIN SYRINGE .3CC/30GX5/16") 30G X 5/16" 0.3 ML MISC Use for insulin administration three times a day 300 each 3   lipase/protease/amylase (CREON) 36000 UNITS CPEP capsule Take 2 capsules (72,000 Units total) by mouth 3 (three) times daily with meals. May also take 1 capsule (36,000 Units total) as needed (with snacks - up to 4 snacks daily). 180 capsule 5    loperamide (IMODIUM) 2 MG capsule Take 2 tabs by mouth with first loose stool, then 1 tab with each additional loose stool as needed. Do not exceed 8 tabs in a 24-hour period 100 capsule 3   OLANZapine (ZYPREXA) 5 MG tablet Take 1 tablet (5 mg total) by mouth at bedtime. 30 tablet 4   ondansetron (ZOFRAN) 8 MG tablet Take 1 tablet (8 mg total) by mouth every 8 (eight) hours as needed for nausea or vomiting. Start on the third day after chemotherapy 30 tablet 1   pantoprazole (PROTONIX) 40 MG tablet Take 1 tablet (40 mg total) by mouth 2 (two) times daily before a meal. 60 tablet 6   prochlorperazine (COMPAZINE) 10 MG tablet Take 1 tablet (10 mg total) by mouth every 6 (six) hours as needed for nausea or vomiting (Nausea or vomiting). 30 tablet 1   No current facility-administered medications for this visit.    PHYSICAL EXAM: Vitals:   12/14/22 1003  BP: 136/60  Pulse: 86  Resp: 20  SpO2: 99%  Weight: 143 lb (64.9 kg)  Height: 5\' 11"  (1.803 m)   Body mass index is 19.94 kg/m.  Wt Readings from Last 3 Encounters:  12/14/22 143 lb (64.9 kg)  12/13/22 144 lb (65.3 kg)  12/12/22 144 lb (65.3 kg)    General: Well developed, well nourished male in no apparent distress.  HEENT: AT/[place], no external lesions.  Eyes: Conjunctiva clear and no icterus. Neck: Neck supple  Lungs: Respirations not labored Neurologic: Alert, oriented, normal speech Extremities / Skin: Dry. No sores or rashes noted. Psychiatric: Does not appear depressed or anxious  Diabetic Foot Exam - Simple   No data filed    LABS Reviewed Lab Results  Component Value Date   HGBA1C 6.2 (A) 12/14/2022   HGBA1C 7.5 (H) 08/31/2022   HGBA1C 6.4 (  A) 06/16/2022   No results found for: "FRUCTOSAMINE" Lab Results  Component Value Date   CHOL 170 03/16/2021   HDL 47.70 03/16/2021   LDLCALC 85 03/16/2021   TRIG 183.0 (H) 03/16/2021   CHOLHDL 4 03/16/2021   Lab Results  Component Value Date   MICRALBCREAT 26.7 06/11/2007    MICRALBCREAT 23.1 08/07/2006   Lab Results  Component Value Date   CREATININE 1.25 (H) 12/12/2022   Lab Results  Component Value Date   GFR 56.87 (L) 09/05/2022    ASSESSMENT / PLAN  1. Type 2 diabetes mellitus with hyperglycemia, with long-term current use of insulin (HCC)   2. Type 2 diabetes mellitus without complication, with long-term current use of insulin (HCC)   3. Hypoglycemia due to insulin     Diabetes Mellitus type 2, complicated by no known complication. - Diabetic status / severity: Controlled with hypo and hyperglycemia.  Lab Results  Component Value Date   HGBA1C 6.2 (A) 12/14/2022    - Hemoglobin A1c goal : <7%  Patient has long history of type 2 diabetes mellitus/prediabetes for several years, was controlled on lifestyle modification.  Multidose insulin therapy was started in October 2024 after the diagnosis of pancreatic cancer during the course of chemotherapy treatment.  Patient has been getting dexamethasone steroid during the chemotherapy cycle.  He is still on chemotherapy cycle, following with oncology.  Patient needs close monitoring and frequent adjustment of insulin regimen while on active treatment for pancreatic cancer on chemotherapy and after anticipated Whipple's procedure.  He is currently on Lantus and Humulin R.  Adjusted diabetes regimen as follows.  Patient is asked to call our clinic, if he still gets hypoglycemia at home probably need to decrease the Lantus further.  - Medications: See below.  Diabetes regimen:  Lantus:  Decreased Lantus from 20 to 16 units daily in the morning once a day only.  During chemo cycle take Lantus 20 units for 3 days after dexamethasone.  Humalog Stopped Humulin R.  Take Humalog 2 to 3 units with meals plus sliding scale 3 times a day.  Okay to take after finishing meals.  Okay not to take Humalog when eating minimal amount of meal.  May take up to 5 units with meals for 3 days after  dexamethasone.  Mild Sliding Scale Blood Glucose        Insulin 60-150                     None 151-200                   1 unit 201-250                   2 units 251-300                   4 units 301-350                   6 units 351-400                   8 units      >400                        9 units and call provider    - Home glucose testing: Freestyle libre 3 and check as needed. - Discussed/ Gave Hypoglycemia treatment plan.  Advised to take glucose tablet 2 to 4 units for correcting  hypoglycemia.  Advised to get it from pharmacy over-the-counter.  Glucagon emergency kit prescribed.  # Consult : not required at this time.   # Annual urine for microalbuminuria/ creatinine ratio, no microalbuminuria currently.  Will discuss and check in the future visit. Last  Lab Results  Component Value Date   MICRALBCREAT 26.7 06/11/2007    # Foot check nightly / neuropathy.  # Annual dilated diabetic eye exams.  Will discuss and plan for diabetic eye exam in the future visit.   2. Blood pressure  -  BP Readings from Last 1 Encounters:  12/14/22 136/60    - Control is in target.  - No change in current plans.  3. Lipid status / Hyperlipidemia - Last  Lab Results  Component Value Date   LDLCALC 85 03/16/2021    Diagnoses and all orders for this visit:  Type 2 diabetes mellitus with hyperglycemia, with long-term current use of insulin (HCC) -     POCT glycosylated hemoglobin (Hb A1C)  Type 2 diabetes mellitus without complication, with long-term current use of insulin (HCC)  Hypoglycemia due to insulin  Other orders -     insulin lispro (HUMALOG KWIKPEN) 100 UNIT/ML KwikPen; 2-3 units with meals 3 times a day plus sliding scale as instructed, maximum 20 units / day. -     insulin glargine (LANTUS) 100 UNIT/ML Solostar Pen; 16 units daily in regular days and 20 units on chemo days for 3 days after dexamethasone. Maxmium 20 units / day. -     Insulin Pen Needle 32G X 4 MM  MISC; Use 4x a day    DISPOSITION Follow up in clinic in 1  months suggested.  Asked to call our clinic in between the visit in regard to question or concern regarding blood glucose.   All questions answered and patient verbalized understanding of the plan.  [place] Erle Guster, MD [facility] Endocrinology [facility] [address] [place], [address] [place], [place] [address] Phone # [phone]  At least part of this note was generated using voice recognition software. Inadvertent word errors may have occurred, which were not recognized during the proofreading process.

## 2022-12-14 NOTE — Patient Instructions (Addendum)
Diabetes regimen:  Lantus:  Lantus 16 units daily in the morning once a day only.  During chemo cycle take Lantus 20 units for 3 days after dexamethasone.   Humalog Stopped Humulin R.  Take Humalog 2 to 3 units with meals plus sliding scale 3 times a day.  Okay to take after finishing meals.  Okay not to take Humalog when eating minimal amount of meal.  May take up to 5 units with meals for 3 days after dexamethasone.  Mild Sliding Scale Blood Glucose        Insulin 60-150                     None 151-200                   1 unit 201-250                   2 units 251-300                   4 units 301-350                   6 units 351-400                   8 units      >400                        9 units and call provider

## 2022-12-15 ENCOUNTER — Other Ambulatory Visit: Payer: Self-pay

## 2022-12-15 DIAGNOSIS — E1165 Type 2 diabetes mellitus with hyperglycemia: Secondary | ICD-10-CM

## 2022-12-15 MED ORDER — AMLODIPINE BESYLATE 10 MG PO TABS: 10.0000 mg | ORAL_TABLET | Freq: Every day | ORAL | 3 refills | Status: AC

## 2022-12-15 MED ORDER — NOVOLOG FLEXPEN 100 UNIT/ML ~~LOC~~ SOPN: PEN_INJECTOR | SUBCUTANEOUS | 11 refills | Status: DC

## 2022-12-15 MED ORDER — PANTOPRAZOLE SODIUM 40 MG PO TBEC: 40.0000 mg | DELAYED_RELEASE_TABLET | Freq: Two times a day (BID) | ORAL | 2 refills | Status: DC

## 2022-12-15 NOTE — Addendum Note (Signed)
Addended by: Waymon Amato R on: 12/15/2022 12:17 PM   Modules accepted: Orders

## 2022-12-19 ENCOUNTER — Other Ambulatory Visit: Payer: Medicare HMO

## 2022-12-19 ENCOUNTER — Inpatient Hospital Stay: Payer: Medicare HMO

## 2022-12-21 ENCOUNTER — Inpatient Hospital Stay: Payer: Medicare HMO

## 2022-12-22 ENCOUNTER — Other Ambulatory Visit: Payer: Self-pay

## 2022-12-29 ENCOUNTER — Other Ambulatory Visit: Payer: Self-pay | Admitting: Adult Health

## 2022-12-29 NOTE — Telephone Encounter (Signed)
  The original prescription was discontinued on 10/10/2022 by Shirline Frees, NP. Renewing this prescription may not be appropriate.

## 2022-12-30 ENCOUNTER — Encounter: Payer: Self-pay | Admitting: Endocrinology

## 2023-01-02 ENCOUNTER — Encounter: Payer: Self-pay | Admitting: *Deleted

## 2023-01-02 ENCOUNTER — Inpatient Hospital Stay (HOSPITAL_BASED_OUTPATIENT_CLINIC_OR_DEPARTMENT_OTHER): Payer: Medicare HMO | Admitting: Hematology & Oncology

## 2023-01-02 ENCOUNTER — Inpatient Hospital Stay: Payer: Medicare HMO

## 2023-01-02 ENCOUNTER — Encounter: Payer: Self-pay | Admitting: Hematology & Oncology

## 2023-01-02 ENCOUNTER — Other Ambulatory Visit: Payer: Self-pay

## 2023-01-02 VITALS — BP 133/70 | HR 62 | Temp 97.6°F | Resp 17 | Ht 71.0 in | Wt 152.0 lb

## 2023-01-02 DIAGNOSIS — K831 Obstruction of bile duct: Secondary | ICD-10-CM

## 2023-01-02 DIAGNOSIS — Z860100 Personal history of colon polyps, unspecified: Secondary | ICD-10-CM

## 2023-01-02 DIAGNOSIS — C251 Malignant neoplasm of body of pancreas: Secondary | ICD-10-CM

## 2023-01-02 DIAGNOSIS — R634 Abnormal weight loss: Secondary | ICD-10-CM

## 2023-01-02 DIAGNOSIS — Z87442 Personal history of urinary calculi: Secondary | ICD-10-CM | POA: Diagnosis not present

## 2023-01-02 DIAGNOSIS — I200 Unstable angina: Secondary | ICD-10-CM

## 2023-01-02 DIAGNOSIS — C252 Malignant neoplasm of tail of pancreas: Secondary | ICD-10-CM | POA: Diagnosis not present

## 2023-01-02 DIAGNOSIS — R3129 Other microscopic hematuria: Secondary | ICD-10-CM

## 2023-01-02 DIAGNOSIS — D45 Polycythemia vera: Secondary | ICD-10-CM

## 2023-01-02 DIAGNOSIS — I10 Essential (primary) hypertension: Secondary | ICD-10-CM

## 2023-01-02 DIAGNOSIS — K8689 Other specified diseases of pancreas: Secondary | ICD-10-CM

## 2023-01-02 DIAGNOSIS — R7303 Prediabetes: Secondary | ICD-10-CM

## 2023-01-02 DIAGNOSIS — M545 Low back pain, unspecified: Secondary | ICD-10-CM

## 2023-01-02 DIAGNOSIS — E785 Hyperlipidemia, unspecified: Secondary | ICD-10-CM

## 2023-01-02 DIAGNOSIS — Z1379 Encounter for other screening for genetic and chromosomal anomalies: Secondary | ICD-10-CM

## 2023-01-02 DIAGNOSIS — I2089 Other forms of angina pectoris: Secondary | ICD-10-CM

## 2023-01-02 DIAGNOSIS — I2510 Atherosclerotic heart disease of native coronary artery without angina pectoris: Secondary | ICD-10-CM

## 2023-01-02 DIAGNOSIS — R7989 Other specified abnormal findings of blood chemistry: Secondary | ICD-10-CM

## 2023-01-02 DIAGNOSIS — K5730 Diverticulosis of large intestine without perforation or abscess without bleeding: Secondary | ICD-10-CM

## 2023-01-02 DIAGNOSIS — K219 Gastro-esophageal reflux disease without esophagitis: Secondary | ICD-10-CM

## 2023-01-02 DIAGNOSIS — I2583 Coronary atherosclerosis due to lipid rich plaque: Secondary | ICD-10-CM

## 2023-01-02 DIAGNOSIS — Z5111 Encounter for antineoplastic chemotherapy: Secondary | ICD-10-CM | POA: Diagnosis not present

## 2023-01-02 DIAGNOSIS — M26629 Arthralgia of temporomandibular joint, unspecified side: Secondary | ICD-10-CM

## 2023-01-02 DIAGNOSIS — M1000 Idiopathic gout, unspecified site: Secondary | ICD-10-CM

## 2023-01-02 DIAGNOSIS — G8929 Other chronic pain: Secondary | ICD-10-CM

## 2023-01-02 DIAGNOSIS — K648 Other hemorrhoids: Secondary | ICD-10-CM

## 2023-01-02 LAB — CMP (CANCER CENTER ONLY)
ALT: 16 U/L (ref 0–44)
AST: 15 U/L (ref 15–41)
Albumin: 3.5 g/dL (ref 3.5–5.0)
Alkaline Phosphatase: 117 U/L (ref 38–126)
Anion gap: 7 (ref 5–15)
BUN: 18 mg/dL (ref 8–23)
CO2: 24 mmol/L (ref 22–32)
Calcium: 8.5 mg/dL — ABNORMAL LOW (ref 8.9–10.3)
Chloride: 105 mmol/L (ref 98–111)
Creatinine: 1.14 mg/dL (ref 0.61–1.24)
GFR, Estimated: >60 mL/min (ref >60)
Glucose, Bld: 417 mg/dL — ABNORMAL HIGH (ref 70–99)
Potassium: 4.1 mmol/L (ref 3.5–5.1)
Sodium: 136 mmol/L (ref 135–145)
Total Bilirubin: 0.6 mg/dL (ref 0.0–1.2)
Total Protein: 6.1 g/dL — ABNORMAL LOW (ref 6.5–8.1)

## 2023-01-02 LAB — PREALBUMIN: Prealbumin: 19 mg/dL (ref 18–38)

## 2023-01-02 LAB — CBC WITH DIFFERENTIAL (CANCER CENTER ONLY)
Abs Immature Granulocytes: 0.06 10*3/uL (ref 0.00–0.07)
Basophils Absolute: 0 10*3/uL (ref 0.0–0.1)
Basophils Relative: 0 %
Eosinophils Absolute: 0.1 10*3/uL (ref 0.0–0.5)
Eosinophils Relative: 1 %
HCT: 33.1 % — ABNORMAL LOW (ref 39.0–52.0)
Hemoglobin: 11 g/dL — ABNORMAL LOW (ref 13.0–17.0)
Immature Granulocytes: 1 %
Lymphocytes Relative: 18 %
Lymphs Abs: 1.2 10*3/uL (ref 0.7–4.0)
MCH: 34.1 pg — ABNORMAL HIGH (ref 26.0–34.0)
MCHC: 33.2 g/dL (ref 30.0–36.0)
MCV: 102.5 fL — ABNORMAL HIGH (ref 80.0–100.0)
Monocytes Absolute: 1 10*3/uL (ref 0.1–1.0)
Monocytes Relative: 15 %
Neutro Abs: 4.3 10*3/uL (ref 1.7–7.7)
Neutrophils Relative %: 65 %
Platelet Count: 204 10*3/uL (ref 150–400)
RBC: 3.23 MIL/uL — ABNORMAL LOW (ref 4.22–5.81)
RDW: 13.5 % (ref 11.5–15.5)
WBC Count: 6.7 10*3/uL (ref 4.0–10.5)
nRBC: 0 % (ref 0.0–0.2)

## 2023-01-02 MED ORDER — SODIUM CHLORIDE 0.9 % IV SOLN
400.0000 mg/m2 | Freq: Once | INTRAVENOUS | Status: AC
  Administered 2023-01-02: 692 mg via INTRAVENOUS
  Filled 2023-01-02: qty 34.6

## 2023-01-02 MED ORDER — OXALIPLATIN CHEMO INJECTION 100 MG/20ML
68.0000 mg/m2 | Freq: Once | INTRAVENOUS | Status: AC
  Administered 2023-01-02: 120 mg via INTRAVENOUS
  Filled 2023-01-02: qty 20

## 2023-01-02 MED ORDER — ATROPINE SULFATE 1 MG/ML IV SOLN
0.5000 mg | Freq: Once | INTRAVENOUS | Status: AC | PRN
  Administered 2023-01-02: 0.5 mg via INTRAVENOUS
  Filled 2023-01-02: qty 1

## 2023-01-02 MED ORDER — SODIUM CHLORIDE 0.9 % IV SOLN
120.0000 mg/m2 | Freq: Once | INTRAVENOUS | Status: AC
  Administered 2023-01-02: 200 mg via INTRAVENOUS
  Filled 2023-01-02: qty 10

## 2023-01-02 MED ORDER — DEXTROSE 5 % IV SOLN: INTRAVENOUS | Status: DC

## 2023-01-02 MED ORDER — PALONOSETRON HCL INJECTION 0.25 MG/5ML
0.2500 mg | Freq: Once | INTRAVENOUS | Status: AC
  Administered 2023-01-02: 0.25 mg via INTRAVENOUS
  Filled 2023-01-02: qty 5

## 2023-01-02 MED ORDER — SODIUM CHLORIDE 0.9 % IV SOLN
150.0000 mg | Freq: Once | INTRAVENOUS | Status: AC
  Administered 2023-01-02: 150 mg via INTRAVENOUS
  Filled 2023-01-02: qty 150

## 2023-01-02 MED ORDER — SODIUM CHLORIDE 0.9 % IV SOLN: INTRAVENOUS | Status: DC

## 2023-01-02 MED ORDER — DEXAMETHASONE SODIUM PHOSPHATE 10 MG/ML IJ SOLN
10.0000 mg | Freq: Once | INTRAMUSCULAR | Status: AC
  Administered 2023-01-02: 10 mg via INTRAVENOUS
  Filled 2023-01-02: qty 1

## 2023-01-02 MED ORDER — FLUOROURACIL CHEMO INJECTION 5 GM/100ML
1920.0000 mg/m2 | INTRAVENOUS | Status: DC
  Administered 2023-01-02: 3500 mg via INTRAVENOUS
  Filled 2023-01-02: qty 70

## 2023-01-02 NOTE — Progress Notes (Signed)
 Hematology and Oncology Follow Up Visit  Alexander [name] [id number] 09/02/1940 82 y.o. 01/02/2023   Principle Diagnosis:  Adenocarcinoma of the pancreas-stage Ib (T2N0M0) --clinical stage --BRCA (-), KRAS (+), HRD(-)  Current Therapy:   Status post biliary stent placement FOLFIRINOX -- Neoadj - s/p cycle #3/4  - start on 10/31/2022     Interim History:  Alexander [name] is back for follow-up.  So far, he has done incredibly well.  He has had 3 cycles of treatment so far.  He is tolerated this very very nicely.  The only problem is that his taste for food really is gone down.  I am not surprised.  I think this is probably the infusional 5-fluorouracil .  His last CA 19-9 was down to 101.  Alexander [name] for starting treatment, he was up to 318.  He sees his doctors out at [facility] on January 27.  At that point time, I am sure that they will set up a surgery date for him.  After believe that he is responding to treatment by the decline in his CA 19-9.  He has had no fever.  He has had no change in bowel or bladder habits.  His weight is gone up a little bit.  He has had no cough or shortness of breath.  He is still walking quite a bit.  He has had no leg swelling.  He has had no rashes.  Overall, I would say that his performance status is ECOG 1.  .   Wt Readings from Last 3 Encounters:  01/02/23 152 lb (68.9 kg)  12/14/22 143 lb (64.9 kg)  12/13/22 144 lb (65.3 kg)    Medications:  Current Outpatient Medications:    Accu-Chek Softclix Lancets lancets, Use to test blood glucose once daily, Disp: 100 each, Rfl: 3   amLODipine  (NORVASC ) 10 MG tablet, Take 1 tablet (10 mg total) by mouth daily., Disp: 90 tablet, Rfl: 3   Blood Glucose Calibration (EMBRACE PRO GLUCOSE CONTROL) LIQD, Use to check blood glucose TID, Disp: 1 each, Rfl:    Blood Glucose Monitoring Suppl (ACCU-CHEK AVIVA PLUS) w/Device KIT, Use to test blood glucose once daily, Disp: 1 kit, Rfl: 0   Continuous Glucose Sensor  (FREESTYLE LIBRE 3 SENSOR) MISC, 1 Device by Does not apply route every 14 (fourteen) days. Place 1 sensor on the skin every 14 days. Use to check glucose continuously, Disp: 2 each, Rfl: 6   Glucagon  (GVOKE HYPOPEN  1-PACK) 1 MG/0.2ML SOAJ, Inject 1 mg into the skin as needed (low blood sugar with impaired consciousness)., Disp: 0.4 mL, Rfl: 2   glucose blood (EMBRACE PRO GLUCOSE TEST) test strip, Use as instructed, Disp: 100 each, Rfl: 12   insulin  aspart (NOVOLOG  FLEXPEN) 100 UNIT/ML FlexPen, 2-3 units with meals 3 times a day plus sliding scale as instructed, maximum 20units/day, Disp: 15 mL, Rfl: 11   insulin  glargine (LANTUS ) 100 UNIT/ML Solostar Pen, 16 units daily in regular days and 20 units on chemo days for 3 days after dexamethasone . Maxmium 20 units / day., Disp: 15 mL, Rfl: 4   insulin  lispro (HUMALOG  KWIKPEN) 100 UNIT/ML KwikPen, 2-3 units with meals 3 times a day plus sliding scale as instructed, maximum 20 units / day., Disp: 15 mL, Rfl: 11   Insulin  Pen Needle 32G X 4 MM MISC, Use 4x a day, Disp: 300 each, Rfl: 3   Insulin  Syringe-Needle U-100 (INSULIN  SYRINGE .3CC/30GX5/16) 30G X 5/16 0.3 ML MISC, Use for insulin  administration three times a  day, Disp: 300 each, Rfl: 3   lipase/protease/amylase (CREON ) 36000 UNITS CPEP capsule, Take 2 capsules (72,000 Units total) by mouth 3 (three) times daily with meals. May also take 1 capsule (36,000 Units total) as needed (with snacks - up to 4 snacks daily)., Disp: 180 capsule, Rfl: 5   loperamide  (IMODIUM ) 2 MG capsule, Take 2 tabs by mouth with first loose stool, then 1 tab with each additional loose stool as needed. Do not exceed 8 tabs in a 24-hour period, Disp: 100 capsule, Rfl: 3   OLANZapine  (ZYPREXA ) 5 MG tablet, Take 1 tablet (5 mg total) by mouth at bedtime., Disp: 30 tablet, Rfl: 4   ondansetron  (ZOFRAN ) 8 MG tablet, Take 1 tablet (8 mg total) by mouth every 8 (eight) hours as needed for nausea or vomiting. Start on the third day after  chemotherapy, Disp: 30 tablet, Rfl: 1   pantoprazole  (PROTONIX ) 40 MG tablet, Take 1 tablet (40 mg total) by mouth 2 (two) times daily before a meal., Disp: 180 tablet, Rfl: 2   prochlorperazine  (COMPAZINE ) 10 MG tablet, Take 1 tablet (10 mg total) by mouth every 6 (six) hours as needed for nausea or vomiting (Nausea or vomiting)., Disp: 30 tablet, Rfl: 1  Allergies:  Allergies  Allergen Reactions   Metformin  And Related Diarrhea   Metformin  Hcl Diarrhea    Past Medical History, Surgical history, Social history, and Family History were reviewed and updated.  Review of Systems: Review of Systems  Constitutional:  Negative for unexpected weight change.  HENT:  Negative.    Eyes:  Negative for icterus.  Respiratory: Negative.    Cardiovascular: Negative.   Gastrointestinal: Negative.   Endocrine: Negative.   Genitourinary: Negative.    Musculoskeletal: Negative.   Skin:  Negative for rash.  Neurological: Negative.   Hematological: Negative.   Psychiatric/Behavioral: Negative.      Physical Exam:  height is 5' 11 (1.803 m) and weight is 152 lb (68.9 kg). His oral temperature is 97.6 F (36.4 C). His blood pressure is 133/70 and his pulse is 62. His respiration is 17 and oxygen saturation is 100%.   Wt Readings from Last 3 Encounters:  01/02/23 152 lb (68.9 kg)  12/14/22 143 lb (64.9 kg)  12/13/22 144 lb (65.3 kg)    Physical Exam Vitals reviewed.  HENT:     Head: Normocephalic and atraumatic.  Eyes:     Pupils: Pupils are equal, round, and reactive to light.     Comments: Ocular exam does show some slight scleral icterus.  Cardiovascular:     Rate and Rhythm: Normal rate and regular rhythm.     Heart sounds: Normal heart sounds.  Pulmonary:     Effort: Pulmonary effort is normal.     Breath sounds: Normal breath sounds.  Abdominal:     General: Bowel sounds are normal.     Palpations: Abdomen is soft.     Comments: Abdominal exam is soft.  He has good bowel  sounds.  There is no fluid wave.  There is no obvious abdominal mass.  There is no palpable hepatosplenomegaly.  Musculoskeletal:        General: No tenderness or deformity. Normal range of motion.     Cervical back: Normal range of motion.  Lymphadenopathy:     Cervical: No cervical adenopathy.  Skin:    General: Skin is warm and dry.     Findings: No erythema or rash.     Comments: Skin exam does not show  any obvious rashes.  He has some jaundice.  Neurological:     Mental Status: He is alert and oriented to person, place, and time.  Psychiatric:        Behavior: Behavior normal.        Thought Content: Thought content normal.        Judgment: Judgment normal.      Lab Results  Component Value Date   WBC 6.7 01/02/2023   HGB 11.0 (L) 01/02/2023   HCT 33.1 (L) 01/02/2023   MCV 102.5 (H) 01/02/2023   PLT 204 01/02/2023     Chemistry      Component Value Date/Time   NA 136 01/02/2023 1007   NA 144 11/29/2016 1054   NA 139 12/29/2015 0954   K 4.1 01/02/2023 1007   K 4.2 11/29/2016 1054   K 4.1 12/29/2015 0954   CL 105 01/02/2023 1007   CL 102 11/29/2016 1054   CO2 24 01/02/2023 1007   CO2 30 11/29/2016 1054   CO2 26 12/29/2015 0954   BUN 18 01/02/2023 1007   BUN 22 11/29/2016 1054   BUN 20.0 12/29/2015 0954   CREATININE 1.14 01/02/2023 1007   CREATININE 1.2 11/29/2016 1054   CREATININE 1.3 12/29/2015 0954      Component Value Date/Time   CALCIUM  8.5 (L) 01/02/2023 1007   CALCIUM  9.3 11/29/2016 1054   CALCIUM  9.5 12/29/2015 0954   ALKPHOS 117 01/02/2023 1007   ALKPHOS 92 (H) 11/29/2016 1054   ALKPHOS 98 12/29/2015 0954   AST 15 01/02/2023 1007   AST 16 12/29/2015 0954   ALT 16 01/02/2023 1007   ALT 20 11/29/2016 1054   ALT 13 12/29/2015 0954   BILITOT 0.6 01/02/2023 1007   BILITOT 0.71 12/29/2015 0954     Encounter Diagnosis  Name Primary?   Malignant neoplasm of body of pancreas (HCC) Yes   Impression and Plan: Mr. Mcbee is a very nice  82 year old white male.  Again, we have been seeing him for polycythemia vera.  Now, his problem is he has early stage pancreatic cancer.  We will continue his chemotherapy in the neoadjuvant setting.  Again he is done incredibly well.  His blood sugars have tended to fluctuate.  At this point in time, I really not too worried about this.  We will go ahead with his fourth cycle.  I will have him come back in 2 weeks for his fifth cycle.  That probably the last cycle that we would give prior to surgery.  Again, I am not sure when surgery will be done.  Am just happy that he is done so well.  He has shown a lot of strength.  He has had a lot of help at home.  He has such a great support system at home with his family.    Maude JONELLE Crease, MD 12/31/202410:42 AM

## 2023-01-02 NOTE — Patient Instructions (Signed)
 [facility] - A DEPT OF [facility]  Discharge Instructions: Thank you for choosing [facility] to provide your oncology and hematology care.   If you have a lab appointment with the Cancer Center, please go directly to the Cancer Center and check in at the registration area.  Wear comfortable clothing and clothing appropriate for easy access to any Portacath or PICC line.   We strive to give you quality time with your provider. You may need to reschedule your appointment if you arrive late (15 or more minutes).  Arriving late affects you and other patients whose appointments are after yours.  Also, if you miss three or more appointments without notifying the office, you may be dismissed from the clinic at the provider's discretion.      For prescription refill requests, have your pharmacy contact our office and allow 72 hours for refills to be completed.    Today you received the following chemotherapy and/or immunotherapy agents:  Oxaliplatin , Leucovorin , Irinotecan  and 5FU.       To help prevent nausea and vomiting after your treatment, we encourage you to take your nausea medication as directed.  BELOW ARE SYMPTOMS THAT SHOULD BE REPORTED IMMEDIATELY: *FEVER GREATER THAN 100.4 F (38 C) OR HIGHER *CHILLS OR SWEATING *NAUSEA AND VOMITING THAT IS NOT CONTROLLED WITH YOUR NAUSEA MEDICATION *UNUSUAL SHORTNESS OF BREATH *UNUSUAL BRUISING OR BLEEDING *URINARY PROBLEMS (pain or burning when urinating, or frequent urination) *BOWEL PROBLEMS (unusual diarrhea, constipation, pain near the anus) TENDERNESS IN MOUTH AND THROAT WITH OR WITHOUT PRESENCE OF ULCERS (sore throat, sores in mouth, or a toothache) UNUSUAL RASH, SWELLING OR PAIN  UNUSUAL VAGINAL DISCHARGE OR ITCHING   Items with * indicate a potential emergency and should be followed up as soon as possible or go to the Emergency Department if any problems should occur.  Please show the  CHEMOTHERAPY ALERT CARD or IMMUNOTHERAPY ALERT CARD at check-in to the Emergency Department and triage nurse. Should you have questions after your visit or need to cancel or reschedule your appointment, please contact [facility] - A DEPT OF [facility]  [phone] and follow the prompts.  Office hours are 8:00 a.m. to 4:30 p.m. Monday - Friday. Please note that voicemails left after 4:00 p.m. may not be returned until the following business day.  We are closed weekends and major holidays. You have access to a nurse at all times for urgent questions. Please call the main number to the clinic [phone] and follow the prompts.  For any non-urgent questions, you may also contact your provider using MyChart. We now offer e-Visits for anyone 71 and older to request care online for non-urgent symptoms. For details visit [email or web address].   Also download the MyChart app! Go to the app store, search MyChart, open the app, select [place], and log in with your MyChart username and password.

## 2023-01-02 NOTE — Patient Instructions (Signed)

## 2023-01-02 NOTE — Progress Notes (Signed)
 Patient continues to have good treatment tolerance. He will see his surgeons at [facility] next month with hopeful plan for surgery.   Oncology Nurse Navigator Documentation     01/02/2023   10:15 AM  Oncology Nurse Navigator Flowsheets  Navigator Follow Up Date: 01/16/2023  Navigator Follow Up Reason: Follow-up Appointment;Chemotherapy  Navigator Location [facility]  Navigator Encounter Type Follow-up Appt  Patient Visit Type MedOnc  Treatment Phase Active Tx  Barriers/Navigation Needs Coordination of Care;Education  Interventions Psycho-Social Support  Acuity Level 2-Minimal Needs (1-2 Barriers Identified)  Support Groups/Services Friends and Family  Time Spent with Patient 15

## 2023-01-03 LAB — CANCER ANTIGEN 19-9: CA 19-9: 74 U/mL — ABNORMAL HIGH (ref 0–35)

## 2023-01-04 ENCOUNTER — Inpatient Hospital Stay: Payer: Medicare HMO | Attending: Hematology & Oncology

## 2023-01-04 VITALS — BP 137/80 | HR 56 | Temp 97.6°F | Resp 17

## 2023-01-04 DIAGNOSIS — C252 Malignant neoplasm of tail of pancreas: Secondary | ICD-10-CM

## 2023-01-04 DIAGNOSIS — C250 Malignant neoplasm of head of pancreas: Secondary | ICD-10-CM | POA: Insufficient documentation

## 2023-01-04 DIAGNOSIS — Z5111 Encounter for antineoplastic chemotherapy: Secondary | ICD-10-CM | POA: Diagnosis present

## 2023-01-04 MED ORDER — HEPARIN SOD (PORK) LOCK FLUSH 100 UNIT/ML IV SOLN
500.0000 [IU] | Freq: Once | INTRAVENOUS | Status: AC | PRN
  Administered 2023-01-04: 500 [IU]

## 2023-01-04 MED ORDER — SODIUM CHLORIDE 0.9% FLUSH
10.0000 mL | INTRAVENOUS | Status: DC | PRN
  Administered 2023-01-04: 10 mL

## 2023-01-04 NOTE — Telephone Encounter (Signed)
 You can use an additional dose of quick acting insulin  with meals as per sliding scale for the blood sugar before eating according to the scale I had provided in the class clinic visit as follows.  You take 6 unit of quick acting insulin  before meals for blood sugar up to 150.  For blood sugar more than 150 you take an additional unit as per  scale as follows.  Mild Sliding Scale Blood Glucose        Insulin  60-150                     None 151-200                   1 unit 201-250                   2 units 251-300                   4 units 301-350                   6 units 351-400                   8 units      >400                        9 units and call provider    Stay on current dose of Lantus  16 units daily.  Rylynn Schoneman, MD [facility] Endocr[facility] [address] [place], [address] [place], [place] [address] Phone # [phone]

## 2023-01-16 ENCOUNTER — Inpatient Hospital Stay: Payer: Medicare HMO

## 2023-01-16 ENCOUNTER — Inpatient Hospital Stay (HOSPITAL_BASED_OUTPATIENT_CLINIC_OR_DEPARTMENT_OTHER): Payer: Medicare HMO | Admitting: Family

## 2023-01-16 ENCOUNTER — Encounter: Payer: Self-pay | Admitting: *Deleted

## 2023-01-16 VITALS — BP 134/71 | HR 58 | Temp 97.9°F | Resp 17 | Ht 71.0 in | Wt 149.0 lb

## 2023-01-16 VITALS — BP 126/63 | HR 56 | Resp 18

## 2023-01-16 DIAGNOSIS — R3129 Other microscopic hematuria: Secondary | ICD-10-CM

## 2023-01-16 DIAGNOSIS — M26629 Arthralgia of temporomandibular joint, unspecified side: Secondary | ICD-10-CM

## 2023-01-16 DIAGNOSIS — K831 Obstruction of bile duct: Secondary | ICD-10-CM

## 2023-01-16 DIAGNOSIS — K8689 Other specified diseases of pancreas: Secondary | ICD-10-CM

## 2023-01-16 DIAGNOSIS — Z1379 Encounter for other screening for genetic and chromosomal anomalies: Secondary | ICD-10-CM

## 2023-01-16 DIAGNOSIS — C251 Malignant neoplasm of body of pancreas: Secondary | ICD-10-CM

## 2023-01-16 DIAGNOSIS — M1000 Idiopathic gout, unspecified site: Secondary | ICD-10-CM

## 2023-01-16 DIAGNOSIS — R7303 Prediabetes: Secondary | ICD-10-CM

## 2023-01-16 DIAGNOSIS — C252 Malignant neoplasm of tail of pancreas: Secondary | ICD-10-CM

## 2023-01-16 DIAGNOSIS — I200 Unstable angina: Secondary | ICD-10-CM

## 2023-01-16 DIAGNOSIS — D45 Polycythemia vera: Secondary | ICD-10-CM

## 2023-01-16 DIAGNOSIS — I10 Essential (primary) hypertension: Secondary | ICD-10-CM

## 2023-01-16 DIAGNOSIS — R7989 Other specified abnormal findings of blood chemistry: Secondary | ICD-10-CM

## 2023-01-16 DIAGNOSIS — I2089 Other forms of angina pectoris: Secondary | ICD-10-CM

## 2023-01-16 DIAGNOSIS — Z87442 Personal history of urinary calculi: Secondary | ICD-10-CM

## 2023-01-16 DIAGNOSIS — Z860100 Personal history of colon polyps, unspecified: Secondary | ICD-10-CM

## 2023-01-16 DIAGNOSIS — K5730 Diverticulosis of large intestine without perforation or abscess without bleeding: Secondary | ICD-10-CM

## 2023-01-16 DIAGNOSIS — K219 Gastro-esophageal reflux disease without esophagitis: Secondary | ICD-10-CM

## 2023-01-16 DIAGNOSIS — E785 Hyperlipidemia, unspecified: Secondary | ICD-10-CM

## 2023-01-16 DIAGNOSIS — Z5111 Encounter for antineoplastic chemotherapy: Secondary | ICD-10-CM | POA: Diagnosis not present

## 2023-01-16 DIAGNOSIS — R634 Abnormal weight loss: Secondary | ICD-10-CM

## 2023-01-16 DIAGNOSIS — K648 Other hemorrhoids: Secondary | ICD-10-CM

## 2023-01-16 DIAGNOSIS — M545 Low back pain, unspecified: Secondary | ICD-10-CM

## 2023-01-16 DIAGNOSIS — I2510 Atherosclerotic heart disease of native coronary artery without angina pectoris: Secondary | ICD-10-CM

## 2023-01-16 LAB — CBC WITH DIFFERENTIAL (CANCER CENTER ONLY)
Abs Immature Granulocytes: 0.02 10*3/uL (ref 0.00–0.07)
Basophils Absolute: 0.1 10*3/uL (ref 0.0–0.1)
Basophils Relative: 1 %
Eosinophils Absolute: 0.3 10*3/uL (ref 0.0–0.5)
Eosinophils Relative: 4 %
HCT: 36.3 % — ABNORMAL LOW (ref 39.0–52.0)
Hemoglobin: 12.2 g/dL — ABNORMAL LOW (ref 13.0–17.0)
Immature Granulocytes: 0 %
Lymphocytes Relative: 25 %
Lymphs Abs: 1.6 10*3/uL (ref 0.7–4.0)
MCH: 34.1 pg — ABNORMAL HIGH (ref 26.0–34.0)
MCHC: 33.6 g/dL (ref 30.0–36.0)
MCV: 101.4 fL — ABNORMAL HIGH (ref 80.0–100.0)
Monocytes Absolute: 0.9 10*3/uL (ref 0.1–1.0)
Monocytes Relative: 14 %
Neutro Abs: 3.7 10*3/uL (ref 1.7–7.7)
Neutrophils Relative %: 56 %
Platelet Count: 187 10*3/uL (ref 150–400)
RBC: 3.58 MIL/uL — ABNORMAL LOW (ref 4.22–5.81)
RDW: 13.2 % (ref 11.5–15.5)
WBC Count: 6.5 10*3/uL (ref 4.0–10.5)
nRBC: 0 % (ref 0.0–0.2)

## 2023-01-16 LAB — CMP (CANCER CENTER ONLY)
ALT: 16 U/L (ref 0–44)
AST: 13 U/L — ABNORMAL LOW (ref 15–41)
Albumin: 3.8 g/dL (ref 3.5–5.0)
Alkaline Phosphatase: 96 U/L (ref 38–126)
Anion gap: 7 (ref 5–15)
BUN: 19 mg/dL (ref 8–23)
CO2: 25 mmol/L (ref 22–32)
Calcium: 8.9 mg/dL (ref 8.9–10.3)
Chloride: 109 mmol/L (ref 98–111)
Creatinine: 1.2 mg/dL (ref 0.61–1.24)
GFR, Estimated: >60 mL/min (ref >60)
Glucose, Bld: 187 mg/dL — ABNORMAL HIGH (ref 70–99)
Potassium: 3.6 mmol/L (ref 3.5–5.1)
Sodium: 141 mmol/L (ref 135–145)
Total Bilirubin: 0.5 mg/dL (ref 0.0–1.2)
Total Protein: 6.2 g/dL — ABNORMAL LOW (ref 6.5–8.1)

## 2023-01-16 MED ORDER — DEXTROSE 5 % IV SOLN
68.0000 mg/m2 | Freq: Once | INTRAVENOUS | Status: AC
  Administered 2023-01-16: 120 mg via INTRAVENOUS
  Filled 2023-01-16: qty 20

## 2023-01-16 MED ORDER — LEUCOVORIN CALCIUM INJECTION 350 MG
400.0000 mg/m2 | Freq: Once | INTRAMUSCULAR | Status: AC
  Administered 2023-01-16: 692 mg via INTRAVENOUS
  Filled 2023-01-16: qty 34.6

## 2023-01-16 MED ORDER — SODIUM CHLORIDE 0.9 % IV SOLN
120.0000 mg/m2 | Freq: Once | INTRAVENOUS | Status: AC
  Administered 2023-01-16: 200 mg via INTRAVENOUS
  Filled 2023-01-16: qty 10

## 2023-01-16 MED ORDER — PALONOSETRON HCL INJECTION 0.25 MG/5ML
0.2500 mg | Freq: Once | INTRAVENOUS | Status: AC
Start: 2023-01-16 — End: 2023-01-16
  Administered 2023-01-16: 0.25 mg via INTRAVENOUS
  Filled 2023-01-16: qty 5

## 2023-01-16 MED ORDER — ATROPINE SULFATE 1 MG/ML IV SOLN
0.5000 mg | Freq: Once | INTRAVENOUS | Status: AC | PRN
  Administered 2023-01-16: 0.5 mg via INTRAVENOUS
  Filled 2023-01-16: qty 1

## 2023-01-16 MED ORDER — DEXTROSE 5 % IV SOLN: INTRAVENOUS | Status: DC

## 2023-01-16 MED ORDER — HEPARIN SOD (PORK) LOCK FLUSH 100 UNIT/ML IV SOLN: 500.0000 [IU] | Freq: Once | INTRAVENOUS | Status: DC | PRN

## 2023-01-16 MED ORDER — SODIUM CHLORIDE 0.9% FLUSH: 10.0000 mL | INTRAVENOUS | Status: DC | PRN

## 2023-01-16 MED ORDER — FOSAPREPITANT DIMEGLUMINE INJECTION 150 MG
150.0000 mg | Freq: Once | INTRAVENOUS | Status: AC
  Administered 2023-01-16: 150 mg via INTRAVENOUS
  Filled 2023-01-16: qty 150

## 2023-01-16 MED ORDER — DEXAMETHASONE SODIUM PHOSPHATE 10 MG/ML IJ SOLN
10.0000 mg | Freq: Once | INTRAMUSCULAR | Status: AC
  Administered 2023-01-16: 10 mg via INTRAVENOUS
  Filled 2023-01-16: qty 1

## 2023-01-16 MED ORDER — FLUOROURACIL CHEMO INJECTION 5 GM/100ML
1920.0000 mg/m2 | INTRAVENOUS | Status: DC
  Administered 2023-01-16: 3500 mg via INTRAVENOUS
  Filled 2023-01-16: qty 70

## 2023-01-16 NOTE — Progress Notes (Signed)
 Hematology and Oncology Follow Up Visit  Alexander [name] [id number] [birth year] 83 y.o. 01/16/2023   Principle Diagnosis:  Adenocarcinoma of the pancreas-stage Ib (T2N0M0) --clinical stage --BRCA (-), KRAS (+), HRD(-)   Current Therapy:        Status post biliary stent placement FOLFIRINOX -- Neoadj - s/p cycle #3/4  - start on 10/31/2022   Interim History:  Alexander [name] is here today for follow-up and treatment. He is doing well and only complaint at this time is food not tasting good. He is still eating regularly and staying well hydrated throughout the day.  Weight is 149 lbs.  No fever, chills, n/v, cough, rash, dizziness, SOB, chest pain, palpitations, abdominal pain or changes in bowel or bladder habits.  No swelling or tenderness in his extremities.  Only numbness and tingling in fingertips when touching something cold after treatment.  No falls or syncope reported.  He enjoys walking 3 miles regularly.  No blood loss, abnormal bruising or petechiae noted.  He follows up with scans, oncology and surgery at [facility] on 01/29/2023.   ECOG Performance Status: 1 - Symptomatic but completely ambulatory  Medications:  Allergies as of 01/16/2023       Reactions   Metformin  And Related Diarrhea   Metformin  Hcl Diarrhea        Medication List        Accurate as of January 16, 2023 10:12 AM. If you have any questions, ask your nurse or doctor.          Accu-Chek Aviva Plus w/Device Kit Use to test blood glucose once daily   Accu-Chek Softclix Lancets lancets Use to test blood glucose once daily   amLODipine  10 MG tablet Commonly known as: NORVASC  Take 1 tablet (10 mg total) by mouth daily.   Embrace Pro Glucose Control Liqd Use to check blood glucose TID   Embrace Pro Glucose Test test strip Generic drug: glucose blood Use as instructed   FreeStyle Libre 3 Sensor Misc 1 Device by Does not apply route every 14 (fourteen) days. Place 1 sensor on the skin every  14 days. Use to check glucose continuously   Gvoke HypoPen  1-Pack 1 MG/0.2ML Soaj Generic drug: Glucagon  Inject 1 mg into the skin as needed (low blood sugar with impaired consciousness).   insulin  glargine 100 UNIT/ML Solostar Pen Commonly known as: LANTUS  16 units daily in regular days and 20 units on chemo days for 3 days after dexamethasone . Maxmium 20 units / day.   insulin  lispro 100 UNIT/ML KwikPen Commonly known as: HumaLOG  KwikPen 2-3 units with meals 3 times a day plus sliding scale as instructed, maximum 20 units / day.   Insulin  Pen Needle 32G X 4 MM Misc Use 4x a day   INSULIN  SYRINGE .3CC/30GX5/16 30G X 5/16 0.3 ML Misc Use for insulin  administration three times a day   lipase/protease/amylase 63999 UNITS Cpep capsule Commonly known as: Creon  Take 2 capsules (72,000 Units total) by mouth 3 (three) times daily with meals. May also take 1 capsule (36,000 Units total) as needed (with snacks - up to 4 snacks daily).   loperamide  2 MG capsule Commonly known as: IMODIUM  Take 2 tabs by mouth with first loose stool, then 1 tab with each additional loose stool as needed. Do not exceed 8 tabs in a 24-hour period   NovoLOG  FlexPen 100 UNIT/ML FlexPen Generic drug: insulin  aspart 2-3 units with meals 3 times a day plus sliding scale as instructed, maximum 20units/day   OLANZapine   5 MG tablet Commonly known as: ZYPREXA  Take 1 tablet (5 mg total) by mouth at bedtime.   ondansetron  8 MG tablet Commonly known as: Zofran  Take 1 tablet (8 mg total) by mouth every 8 (eight) hours as needed for nausea or vomiting. Start on the third day after chemotherapy   pantoprazole  40 MG tablet Commonly known as: PROTONIX  Take 1 tablet (40 mg total) by mouth 2 (two) times daily before a meal.   prochlorperazine  10 MG tablet Commonly known as: COMPAZINE  Take 1 tablet (10 mg total) by mouth every 6 (six) hours as needed for nausea or vomiting (Nausea or vomiting).        Allergies:   Allergies  Allergen Reactions   Metformin  And Related Diarrhea   Metformin  Hcl Diarrhea    Past Medical History, Surgical history, Social history, and Family History were reviewed and updated.  Review of Systems: All other 10 point review of systems is negative.   Physical Exam:  height is 5' 11 (1.803 m) and weight is 149 lb (67.6 kg). His oral temperature is 97.9 F (36.6 C). His blood pressure is 134/71 and his pulse is 58 (abnormal). His respiration is 17 and oxygen saturation is 100%.   Wt Readings from Last 3 Encounters:  01/16/23 149 lb (67.6 kg)  01/02/23 152 lb (68.9 kg)  12/14/22 143 lb (64.9 kg)    Ocular: Sclerae unicteric, pupils equal, round and reactive to light Ear-nose-throat: Oropharynx clear, dentition fair Lymphatic: No cervical or supraclavicular adenopathy Lungs no rales or rhonchi, good excursion bilaterally Heart regular rate and rhythm, no murmur appreciated Abd soft, nontender, positive bowel sounds MSK no focal spinal tenderness, no joint edema Neuro: non-focal, well-oriented, appropriate affect Breasts: Deferred   Lab Results  Component Value Date   WBC 6.7 01/02/2023   HGB 11.0 (L) 01/02/2023   HCT 33.1 (L) 01/02/2023   MCV 102.5 (H) 01/02/2023   PLT 204 01/02/2023   Lab Results  Component Value Date   FERRITIN 240 08/30/2022   IRON 103 08/30/2022   TIBC 305 08/30/2022   UIBC 202 08/30/2022   IRONPCTSAT 34 08/30/2022   Lab Results  Component Value Date   RETICCTPCT 0.7 08/30/2022   RBC 3.23 (L) 01/02/2023   RETICCTABS 32.6 12/19/2013   No results found for: KPAFRELGTCHN, LAMBDASER, KAPLAMBRATIO No results found for: IGGSERUM, IGA, IGMSERUM No results found for: STEPHANY CARLOTA BENSON MARKEL EARLA JOANNIE DOC, MSPIKE, SPEI   Chemistry      Component Value Date/Time   NA 136 01/02/2023 1007   NA 144 11/29/2016 1054   NA 139 12/29/2015 0954   K 4.1 01/02/2023 1007   K 4.2 11/29/2016  1054   K 4.1 12/29/2015 0954   CL 105 01/02/2023 1007   CL 102 11/29/2016 1054   CO2 24 01/02/2023 1007   CO2 30 11/29/2016 1054   CO2 26 12/29/2015 0954   BUN 18 01/02/2023 1007   BUN 22 11/29/2016 1054   BUN 20.0 12/29/2015 0954   CREATININE 1.14 01/02/2023 1007   CREATININE 1.2 11/29/2016 1054   CREATININE 1.3 12/29/2015 0954      Component Value Date/Time   CALCIUM  8.5 (L) 01/02/2023 1007   CALCIUM  9.3 11/29/2016 1054   CALCIUM  9.5 12/29/2015 0954   ALKPHOS 117 01/02/2023 1007   ALKPHOS 92 (H) 11/29/2016 1054   ALKPHOS 98 12/29/2015 0954   AST 15 01/02/2023 1007   AST 16 12/29/2015 0954   ALT 16 01/02/2023 1007   ALT 20  11/29/2016 1054   ALT 13 12/29/2015 0954   BILITOT 0.6 01/02/2023 1007   BILITOT 0.71 12/29/2015 0954       Impression and Plan: Alexander [name] is a pleasant 83 yo gentleman with original diagnosis of  polycythemia vera and now early stage pancreatic cancer.  CA 19-9 last visit was down to 74 (previously 101).  We will proceed with treatment today as planned.  He follows up with [facility] for repeat scans and to see oncology and surgery on 01/29/2023.  Follow-up with our office in 2 weeks.   Lauraine Pepper, NP 1/14/202510:12 AM

## 2023-01-16 NOTE — Progress Notes (Signed)
 Patient will proceed with next cycle. He follows up with [facility] later this month for surgical consideration.   Oncology Nurse Navigator Documentation     01/16/2023   11:00 AM  Oncology Nurse Navigator Flowsheets  Navigator Follow Up Date: 01/29/2023  Navigator Follow Up Reason: Review Note  Navigator Location [facility]  Navigator Encounter Type Follow-up Appt  Patient Visit Type MedOnc  Treatment Phase Active Tx  Barriers/Navigation Needs Coordination of Care;Education  Interventions None Required  Acuity Level 2-Minimal Needs (1-2 Barriers Identified)  Support Groups/Services Friends and Family  Time Spent with Patient 15

## 2023-01-16 NOTE — Patient Instructions (Signed)
 [facility] - A DEPT OF [facility]  Discharge Instructions: Thank you for choosing [facility] to provide your oncology and hematology care.   If you have a lab appointment with the Cancer Center, please go directly to the Cancer Center and check in at the registration area.  Wear comfortable clothing and clothing appropriate for easy access to any Portacath or PICC line.   We strive to give you quality time with your provider. You may need to reschedule your appointment if you arrive late (15 or more minutes).  Arriving late affects you and other patients whose appointments are after yours.  Also, if you miss three or more appointments without notifying the office, you may be dismissed from the clinic at the provider's discretion.      For prescription refill requests, have your pharmacy contact our office and allow 72 hours for refills to be completed.    Today you received the following chemotherapy and/or immunotherapy agents:  Oxaliplatin , Leucovorin , Irinotecan  and 5FU.       To help prevent nausea and vomiting after your treatment, we encourage you to take your nausea medication as directed.  BELOW ARE SYMPTOMS THAT SHOULD BE REPORTED IMMEDIATELY: *FEVER GREATER THAN 100.4 F (38 C) OR HIGHER *CHILLS OR SWEATING *NAUSEA AND VOMITING THAT IS NOT CONTROLLED WITH YOUR NAUSEA MEDICATION *UNUSUAL SHORTNESS OF BREATH *UNUSUAL BRUISING OR BLEEDING *URINARY PROBLEMS (pain or burning when urinating, or frequent urination) *BOWEL PROBLEMS (unusual diarrhea, constipation, pain near the anus) TENDERNESS IN MOUTH AND THROAT WITH OR WITHOUT PRESENCE OF ULCERS (sore throat, sores in mouth, or a toothache) UNUSUAL RASH, SWELLING OR PAIN  UNUSUAL VAGINAL DISCHARGE OR ITCHING   Items with * indicate a potential emergency and should be followed up as soon as possible or go to the Emergency Department if any problems should occur.  Please show the  CHEMOTHERAPY ALERT CARD or IMMUNOTHERAPY ALERT CARD at check-in to the Emergency Department and triage nurse. Should you have questions after your visit or need to cancel or reschedule your appointment, please contact [facility] - A DEPT OF [facility]  [phone] and follow the prompts.  Office hours are 8:00 a.m. to 4:30 p.m. Monday - Friday. Please note that voicemails left after 4:00 p.m. may not be returned until the following business day.  We are closed weekends and major holidays. You have access to a nurse at all times for urgent questions. Please call the main number to the clinic [phone] and follow the prompts.  For any non-urgent questions, you may also contact your provider using MyChart. We now offer e-Visits for anyone 71 and older to request care online for non-urgent symptoms. For details visit [email or web address].   Also download the MyChart app! Go to the app store, search MyChart, open the app, select [place], and log in with your MyChart username and password.

## 2023-01-17 ENCOUNTER — Encounter: Payer: Self-pay | Admitting: Adult Health

## 2023-01-17 LAB — CANCER ANTIGEN 19-9: CA 19-9: 74 U/mL — ABNORMAL HIGH (ref 0–35)

## 2023-01-18 ENCOUNTER — Inpatient Hospital Stay: Payer: Medicare HMO

## 2023-01-18 ENCOUNTER — Other Ambulatory Visit: Payer: Self-pay

## 2023-01-18 VITALS — BP 147/92 | HR 52 | Temp 97.8°F | Resp 18

## 2023-01-18 DIAGNOSIS — Z5111 Encounter for antineoplastic chemotherapy: Secondary | ICD-10-CM | POA: Diagnosis not present

## 2023-01-18 DIAGNOSIS — C252 Malignant neoplasm of tail of pancreas: Secondary | ICD-10-CM

## 2023-01-18 MED ORDER — SODIUM CHLORIDE 0.9% FLUSH
10.0000 mL | INTRAVENOUS | Status: DC | PRN
  Administered 2023-01-18: 10 mL

## 2023-01-18 MED ORDER — HEPARIN SOD (PORK) LOCK FLUSH 100 UNIT/ML IV SOLN
500.0000 [IU] | Freq: Once | INTRAVENOUS | Status: AC | PRN
  Administered 2023-01-18: 500 [IU]

## 2023-01-30 ENCOUNTER — Encounter: Payer: Self-pay | Admitting: Hematology & Oncology

## 2023-01-30 ENCOUNTER — Encounter: Payer: Self-pay | Admitting: *Deleted

## 2023-01-30 NOTE — Progress Notes (Signed)
Patient seen at [facility]. Not a surgical candidate at this time. He will continue with seven more cycle for a total of twelve and then be reconsidered.   Oncology Nurse Navigator Documentation     01/30/2023    8:00 AM  Oncology Nurse Navigator Flowsheets  Navigator Follow Up Date: 02/08/2023  Navigator Follow Up Reason: Follow-up Appointment;Chemotherapy  Navigator Location [facility]  Navigator Encounter Type Appt/Treatment Plan Review  Patient Visit Type MedOnc  Treatment Phase Active Tx  Barriers/Navigation Needs Coordination of Care;Education  Interventions None Required  Acuity Level 2-Minimal Needs (1-2 Barriers Identified)  Support Groups/Services Friends and Family  Time Spent with Patient 15

## 2023-01-31 ENCOUNTER — Other Ambulatory Visit: Payer: Self-pay | Admitting: Hematology & Oncology

## 2023-01-31 ENCOUNTER — Ambulatory Visit (INDEPENDENT_AMBULATORY_CARE_PROVIDER_SITE_OTHER): Payer: Medicare HMO | Admitting: Endocrinology

## 2023-01-31 ENCOUNTER — Encounter: Payer: Self-pay | Admitting: Endocrinology

## 2023-01-31 ENCOUNTER — Other Ambulatory Visit: Payer: Self-pay | Admitting: Adult Health

## 2023-01-31 VITALS — BP 172/72 | HR 61 | Wt 153.2 lb

## 2023-01-31 DIAGNOSIS — Z794 Long term (current) use of insulin: Secondary | ICD-10-CM | POA: Diagnosis not present

## 2023-01-31 DIAGNOSIS — E1165 Type 2 diabetes mellitus with hyperglycemia: Secondary | ICD-10-CM | POA: Diagnosis not present

## 2023-01-31 LAB — POCT GLYCOSYLATED HEMOGLOBIN (HGB A1C): Hemoglobin A1C: 6.1 % — AB (ref 4.0–5.6)

## 2023-01-31 NOTE — Patient Instructions (Addendum)
Diabetes regimen:   Lantus 18 units daily in the morning once a day only.  Humalog 8 units with breakfast, 5 units with lunch and 8 units with supper.  If you exercise after breakfast take Humalog 5 units with breakfast also.  Mild Sliding Scale Blood Glucose        Insulin 60-150                     None 151-200                   1 unit 201-250                   2 units 251-300                   4 units 301-350                   6 units 351-400                   8 units      >400                        9 units and call provider

## 2023-01-31 NOTE — Progress Notes (Signed)
Outpatient Endocrinology Note Alexander Jailen Coward, MD  01/31/23  Patient's Name: Alexander [name]    DOB: August 17, 1940    MRN: [id number]                                                    REASON OF VISIT: Follow-up for type 2 diabetes mellitus  REFERRING PROVIDER: Shirline Frees, NP  PCP: Shirline Frees, NP  HISTORY OF PRESENT ILLNESS:   Alexander [name] is a 83 y.o. old male with past medical history listed below, is here for follow-up for type 2 diabetes mellitus.   Pertinent Diabetes History: Patient has type 2 diabetes mellitus, based on hemoglobin A1c 6.6% at least from 2008, was controlled with mostly hemoglobin A1c in the range of 5.6 to 6.4% for several years, mostly in prediabetes range on lifestyle modification.  In March 2024 he had hemoglobin A1c elevated to 8.1%, was started on glipizide however later he stopped due to patient concern of weight loss after few weeks.  He had tried metformin in the past for short time, stopped due to GI intolerance/diarrhea.  He was diagnosed with adenocarcinoma of pancreas in September 2024, is being treated with neoadjuvant chemotherapy, possible surgery in the future. During the course of pancreatic cancer treatment he was started on insulin therapy in October 2024, and referred to endocrinology for further evaluation and management of type 2 diabetes mellitus, was initially seen in December 2024.  She has been seeing dietitian/nutritionist as well.  Family h/o diabetes mellitus: mother and father had type 2 diabetes mellitus.[name]    He was started on Lantus 20 units 2 times a day and Humulin R 5 units with meals 3 times a day.  Lantus evening dose was gradually decreased, was taking 10 units until 2 days ago and he stopped.  He continued on Lantus 20 units in the morning only.  Insulin doses has been adjusted over time.  Chronic Diabetes Complications : Retinopathy: no , reportedly. Last ophthalmology exam was done on annually.  Nephropathy:  no Peripheral neuropathy: no Coronary artery disease: no Stroke: no  Relevant comorbidities and cardiovascular risk factors: Obesity: no Body mass index is 21.37 kg/m.  Hypertension: Yes  Hyperlipidemia : Yes, on statin   Current / Home Diabetic regimen includes:  Lantus 18 units in the morning daily.  Humnalog 8 units with meals 3 times a day.   Prior diabetic medications: Glipizide, was treated in March 2024.  Metformin had GI intolerance with diarrhea in the past.  Glycemic data:    CONTINUOUS GLUCOSE MONITORING SYSTEM (CGMS) INTERPRETATION: At today's visit, we reviewed CGM downloads. The full report is scanned in the media. Reviewing the CGM trends, blood glucose are as follows:  FreeStyle Libre 3 CGM-  Sensor Download (Sensor download was reviewed and summarized below.) Dates: January 16 to January 31, 2023, 14 days Sensor Average: 148  Glucose Management Indicator: 6.9%    Interpretation: Patient has mostly acceptable blood sugar with sporadic hyperglycemia with blood sugar up to 280 range related with meals and intermittent hypoglycemia with blood sugar in 60s especially in the late afternoon following the exercise in the afternoon.  Mostly acceptable blood sugar overnight.  Rare hypoglycemia in the night with blood sugar 64, 68.  GMI 6.9%.  Percent of hypoglycemia has decreased to 1% compared to was 7% in last  visit.  Hypoglycemia: Patient has minor hypoglycemic episodes. Patient has hypoglycemia awareness.  Factors modifying glucose control: 1.  Diabetic diet assessment: Small and variable meals, loss of appetite with recent diagnosis of pancreatic cancer on chemotherapy, unpredictable eating.  2.  Staying active or exercising: none  3.  Medication compliance: compliant all of the time.  Other: He was diagnosed with adenocarcinoma of pancreas in September 2024, is being treated with neoadjuvant chemotherapy, possible surgery in the future.  With a plan to  follow-up surgery in the future.  Has been following with oncology.  Interval history  CGM data as reviewed above.  Diabetes/insulin regimen reviewed and as noted above.  Patient mostly every day exercise during afternoon after lunch.  He has intermittent hypoglycemia after exercise.  Otherwise mostly acceptable blood sugar.  Denies any other complaints today.  He is accompanied by his wife in the clinic today.  REVIEW OF SYSTEMS As per history of present illness.   PAST MEDICAL HISTORY: Past Medical History:  Diagnosis Date   Arthritis    "minor; shoulders primarily" (05/20/2013)   CAD (coronary artery disease)    a. 05/2013 - Chest pain/unstable angina and dynamic EKG changes showing cath showing atherosclerotic coronary artery disease manifested as diffuse ectasia, normal EF.   Diverticulosis of colon    GERD (gastroesophageal reflux disease)    GI bleed    secondary to Aspirin   Gout    Hematuria, microscopic    Hemorrhoids    Hyperglycemia    Hyperlipidemia    Hypertension    Lumbar back pain    Microalbuminuria    Overweight(278.02)    Pancreatic cancer (HCC) 09/22/2022   Polycythemia rubra vera (HCC)    Tubular adenoma of colon 07/2012    PAST SURGICAL HISTORY: Past Surgical History:  Procedure Laterality Date   BILIARY STENT PLACEMENT N/A 09/02/2022   Procedure: BILIARY STENT PLACEMENT;  Surgeon: Hilarie Fredrickson, MD;  Location: [facility];  Service: Gastroenterology;  Laterality: N/A;   BILIARY STENT PLACEMENT  10/13/2022   Procedure: BILIARY STENT PLACEMENT;  Surgeon: Meryl Dare, MD;  Location: [facility] ENDOSCOPY;  Service: Gastroenterology;;   BIOPSY  09/11/2022   Procedure: BIOPSY;  Surgeon: Lemar Lofty., MD;  Location: [facility] ENDOSCOPY;  Service: Gastroenterology;;   CARDIAC CATHETERIZATION  05/20/2013   CATARACT EXTRACTION W/ INTRAOCULAR LENS  IMPLANT, BILATERAL Bilateral 2008   CYSTECTOMY  ~ 2000   " SEBACEOUS cyst removed from between shoulder blades"    ENDOSCOPIC RETROGRADE CHOLANGIOPANCREATOGRAPHY (ERCP) WITH PROPOFOL N/A 09/02/2022   Procedure: ENDOSCOPIC RETROGRADE CHOLANGIOPANCREATOGRAPHY (ERCP) WITH PROPOFOL;  Surgeon: Hilarie Fredrickson, MD;  Location: [facility];  Service: Gastroenterology;  Laterality: N/A;   ENDOSCOPIC RETROGRADE CHOLANGIOPANCREATOGRAPHY (ERCP) WITH PROPOFOL N/A 10/13/2022   Procedure: ENDOSCOPIC RETROGRADE CHOLANGIOPANCREATOGRAPHY (ERCP) WITH PROPOFOL;  Surgeon: Meryl Dare, MD;  Location: [facility] ENDOSCOPY;  Service: Gastroenterology;  Laterality: N/A;   ESOPHAGOGASTRODUODENOSCOPY N/A 09/11/2022   Procedure: ESOPHAGOGASTRODUODENOSCOPY (EGD);  Surgeon: Lemar Lofty., MD;  Location: [facility];  Service: Gastroenterology;  Laterality: N/A;   EUS N/A 09/11/2022   Procedure: UPPER ENDOSCOPIC ULTRASOUND (EUS) RADIAL;  Surgeon: Lemar Lofty., MD;  Location: [facility] ENDOSCOPY;  Service: Gastroenterology;  Laterality: N/A;   EXCISIONAL HEMORRHOIDECTOMY  1970's   FINE NEEDLE ASPIRATION N/A 09/11/2022   Procedure: FINE NEEDLE ASPIRATION (FNA) LINEAR;  Surgeon: Lemar Lofty., MD;  Location: [facility] ENDOSCOPY;  Service: Gastroenterology;  Laterality: N/A;   INGUINAL HERNIA REPAIR Right ~ 2010   IR IMAGING GUIDED PORT  INSERTION  10/27/2022   LEFT HEART CATHETERIZATION WITH CORONARY ANGIOGRAM N/A 05/20/2013   Procedure: LEFT HEART CATHETERIZATION WITH CORONARY ANGIOGRAM;  Surgeon: Peter M Swaziland, MD;  Location: [facility] CATH LAB;  Service: Cardiovascular;  Laterality: N/A;   SPHINCTEROTOMY  09/02/2022   Procedure: SPHINCTEROTOMY;  Surgeon: Hilarie Fredrickson, MD;  Location: [facility];  Service: Gastroenterology;;   Francine Graven REMOVAL  10/13/2022   Procedure: STENT REMOVAL;  Surgeon: Meryl Dare, MD;  Location: [facility] ENDOSCOPY;  Service: Gastroenterology;;   TONSILLECTOMY AND ADENOIDECTOMY  1940's   VASECTOMY      ALLERGIES: Allergies  Allergen Reactions   Metformin And Related Diarrhea   Metformin Hcl Diarrhea    FAMILY  HISTORY:  Family History  Problem Relation Age of Onset   Colon cancer Mother        died at 24, colorectal   Diabetes Mother    Heart disease Father 78       MI   Diabetes Father    Stomach cancer Maternal Grandfather    Cancer Maternal Uncle     SOCIAL HISTORY: Social History   Socioeconomic History   Marital status: Married    Spouse name: Not on file   Number of children: 3   Years of education: Not on file   Highest education level: Some college, no degree  Occupational History   Occupation: retired Garment/textile technologist: RETIRED  Tobacco Use   Smoking status: Never   Smokeless tobacco: Never   Tobacco comments:    NEVER USED TOBACCO  Vaping Use   Vaping status: Never Used  Substance and Sexual Activity   Alcohol use: No    Alcohol/week: 0.0 standard drinks of alcohol   Drug use: No   Sexual activity: Yes    Partners: Female  Other Topics Concern   Not on file  Social History Narrative   Marriedfor 52 years    Daughter, Physicist, medical (pediatrician--lives in [place], [place]), one in [place] - Warden/ranger. One in [place] - Chartered loss adjuster          Social Drivers of Health   Financial Resource Strain: Low Risk  (10/16/2022)   Overall Financial Resource Strain (CARDIA)    Difficulty of Paying Living Expenses: Not hard at all  Food Insecurity: No Food Insecurity (11/08/2022)   Hunger Vital Sign    Worried About Running Out of Food in the Last Year: Never true    Ran Out of Food in the Last Year: Never true  Transportation Needs: No Transportation Needs (11/08/2022)   PRAPARE - Administrator, Civil Service (Medical): No    Lack of Transportation (Non-Medical): No  Physical Activity: Sufficiently Active (10/16/2022)   Exercise Vital Sign    Days of Exercise per Week: 3 days    Minutes of Exercise per Session: 60 min  Stress: No Stress Concern Present (10/16/2022)   [employer] of Occupational Health - Occupational Stress Questionnaire    Feeling of Stress :  Only a little  Social Connections: Socially Integrated (10/16/2022)   Social Connection and Isolation Panel [NHANES]    Frequency of Communication with Friends and Family: More than three times a week    Frequency of Social Gatherings with Friends and Family: More than three times a week    Attends Religious Services: 1 to 4 times per year    Active Member of [employer] or Organizations: Yes    Attends Banker Meetings: Never    Marital Status: Married  MEDICATIONS:  Current Outpatient Medications  Medication Sig Dispense Refill   Accu-Chek Softclix Lancets lancets Use to test blood glucose once daily 100 each 3   amLODipine (NORVASC) 10 MG tablet Take 1 tablet (10 mg total) by mouth daily. 90 tablet 3   Blood Glucose Calibration (EMBRACE PRO GLUCOSE CONTROL) LIQD Use to check blood glucose TID 1 each    Blood Glucose Monitoring Suppl (ACCU-CHEK AVIVA PLUS) w/Device KIT Use to test blood glucose once daily 1 kit 0   Continuous Glucose Sensor (FREESTYLE LIBRE 3 SENSOR) MISC 1 Device by Does not apply route every 14 (fourteen) days. Place 1 sensor on the skin every 14 days. Use to check glucose continuously 2 each 6   Glucagon (GVOKE HYPOPEN 1-PACK) 1 MG/0.2ML SOAJ Inject 1 mg into the skin as needed (low blood sugar with impaired consciousness). 0.4 mL 2   glucose blood (EMBRACE PRO GLUCOSE TEST) test strip Use as instructed 100 each 12   insulin aspart (NOVOLOG FLEXPEN) 100 UNIT/ML FlexPen 2-3 units with meals 3 times a day plus sliding scale as instructed, maximum 20units/day 15 mL 11   insulin glargine (LANTUS) 100 UNIT/ML Solostar Pen 16 units daily in regular days and 20 units on chemo days for 3 days after dexamethasone. Maxmium 20 units / day. 15 mL 4   insulin lispro (HUMALOG KWIKPEN) 100 UNIT/ML KwikPen 2-3 units with meals 3 times a day plus sliding scale as instructed, maximum 20 units / day. 15 mL 11   Insulin Pen Needle 32G X 4 MM MISC Use 4x a day 300 each 3   Insulin  Syringe-Needle U-100 (INSULIN SYRINGE .3CC/30GX5/16") 30G X 5/16" 0.3 ML MISC Use for insulin administration three times a day 300 each 3   lipase/protease/amylase (CREON) 36000 UNITS CPEP capsule Take 2 capsules (72,000 Units total) by mouth 3 (three) times daily with meals. May also take 1 capsule (36,000 Units total) as needed (with snacks - up to 4 snacks daily). 180 capsule 5   loperamide (IMODIUM) 2 MG capsule Take 2 tabs by mouth with first loose stool, then 1 tab with each additional loose stool as needed. Do not exceed 8 tabs in a 24-hour period 100 capsule 3   OLANZapine (ZYPREXA) 5 MG tablet Take 1 tablet (5 mg total) by mouth at bedtime. 30 tablet 4   ondansetron (ZOFRAN) 8 MG tablet Take 1 tablet (8 mg total) by mouth every 8 (eight) hours as needed for nausea or vomiting. Start on the third day after chemotherapy 30 tablet 1   pantoprazole (PROTONIX) 40 MG tablet Take 1 tablet (40 mg total) by mouth 2 (two) times daily before a meal. 180 tablet 2   prochlorperazine (COMPAZINE) 10 MG tablet Take 1 tablet (10 mg total) by mouth every 6 (six) hours as needed for nausea or vomiting (Nausea or vomiting). 30 tablet 1   No current facility-administered medications for this visit.    PHYSICAL EXAM: Vitals:   01/31/23 1410  BP: (!) 172/72  Pulse: 61  SpO2: 98%  Weight: 153 lb 3.2 oz (69.5 kg)   Body mass index is 21.37 kg/m.  Wt Readings from Last 3 Encounters:  01/31/23 153 lb 3.2 oz (69.5 kg)  01/16/23 149 lb (67.6 kg)  01/02/23 152 lb (68.9 kg)    General: Well developed, well nourished male in no apparent distress.  HEENT: AT/[place], no external lesions.  Eyes: Conjunctiva clear and no icterus. Neck: Neck supple  Lungs: Respirations not labored Neurologic: Alert, oriented,  normal speech Extremities / Skin: Dry. No sores or rashes noted. Psychiatric: Does not appear depressed or anxious  Diabetic Foot Exam - Simple   Simple Foot Form Diabetic Foot exam was performed with the  following findings: Yes 01/31/2023  2:38 PM  Visual Inspection No deformities, no ulcerations, no other skin breakdown bilaterally: Yes Sensation Testing Intact to touch and monofilament testing bilaterally: Yes Pulse Check See comments: Yes Comments DP palpable bilaterally.     LABS Reviewed Lab Results  Component Value Date   HGBA1C 6.1 (A) 01/31/2023   HGBA1C 6.2 (A) 12/14/2022   HGBA1C 7.5 (H) 08/31/2022   No results found for: "FRUCTOSAMINE" Lab Results  Component Value Date   CHOL 170 03/16/2021   HDL 47.70 03/16/2021   LDLCALC 85 03/16/2021   TRIG 183.0 (H) 03/16/2021   CHOLHDL 4 03/16/2021   Lab Results  Component Value Date   MICRALBCREAT 26.7 06/11/2007   MICRALBCREAT 23.1 08/07/2006   Lab Results  Component Value Date   CREATININE 1.20 01/16/2023   Lab Results  Component Value Date   GFR 56.87 (L) 09/05/2022    ASSESSMENT / PLAN  1. Type 2 diabetes mellitus with hyperglycemia, with long-term current use of insulin (HCC)     Diabetes Mellitus type 2, complicated by no known complication. - Diabetic status / severity: Controlled with hypo and hyperglycemia.  Lab Results  Component Value Date   HGBA1C 6.1 (A) 01/31/2023    - Hemoglobin A1c goal : <7%  Patient has long history of type 2 diabetes mellitus/prediabetes for several years, was controlled on lifestyle modification.  Multidose insulin therapy was started in October 2024 after the diagnosis of pancreatic cancer during the course of chemotherapy treatment.  Patient has been getting dexamethasone steroid during the chemotherapy cycle.  He is still on ? chemotherapy cycle, following with oncology.  Patient needs close monitoring and frequent adjustment of insulin regimen while on active treatment for pancreatic cancer on chemotherapy and after anticipated Whipple's procedure.  CGM data as reviewed above.  Significant improvement on hypoglycemia.  Occasional hypoglycemia after exercise in the  afternoon.  - Medications: See below.  Adjusted as follows.  Diabetes regimen:  Continue Lantus 18 units in the morning daily.  Humalog 8 units with breakfast, 5 units with lunch and 8 units with supper.  Decrease Humalog for the long time as he exercise after lunch to prevent hypoglycemia.  During chemo cycle take Lantus 20 units for 3 days after dexamethasone.   If you exercise after breakfast take Humalog 5 units with breakfast also.  Mild Sliding Scale Blood Glucose        Insulin 60-150                     None 151-200                   1 unit 201-250                   2 units 251-300                   4 units 301-350                   6 units 351-400                   8 units      >400  9 units and call provider    - Home glucose testing: Freestyle libre 3 and check as needed.  - Discussed/ Gave Hypoglycemia treatment plan.  Advised to take glucose tablet 2 to 4 units for correcting hypoglycemia.  Advised to get it from pharmacy over-the-counter.  Glucagon emergency kit prescribed.  # Consult : not required at this time.   # Annual urine for microalbuminuria/ creatinine ratio, no microalbuminuria currently.  Will discuss and check today.   Last  Lab Results  Component Value Date   MICRALBCREAT 26.7 06/11/2007    # Foot check nightly / neuropathy.  # Annual dilated diabetic eye exams.    2. Blood pressure  -  BP Readings from Last 1 Encounters:  01/31/23 (!) 172/72    - Control is in target. Repeat BP : 140/78 mmHg - No change in current plans.  3. Lipid status / Hyperlipidemia - Last  Lab Results  Component Value Date   LDLCALC 85 03/16/2021    Diagnoses and all orders for this visit:  Type 2 diabetes mellitus with hyperglycemia, with long-term current use of insulin (HCC) -     POCT glycosylated hemoglobin (Hb A1C) -     Microalbumin / creatinine urine ratio     DISPOSITION Follow up in clinic in 3 months suggested.   Asked to call our clinic in between the visit in regard to question or concern regarding blood glucose.   All questions answered and patient verbalized understanding of the plan.  Alexander Kavian Peters, MD [facility] Endocrinology [facility] [address] [place], [address] [place], [place] [address] Phone # [phone]  At least part of this note was generated using voice recognition software. Inadvertent word errors may have occurred, which were not recognized during the proofreading process.

## 2023-02-01 ENCOUNTER — Encounter: Payer: Self-pay | Admitting: Endocrinology

## 2023-02-01 ENCOUNTER — Other Ambulatory Visit: Payer: Self-pay

## 2023-02-01 LAB — MICROALBUMIN / CREATININE URINE RATIO
Creatinine, Urine: 124 mg/dL (ref 20–320)
Microalb Creat Ratio: 56 mg/g{creat} — ABNORMAL HIGH (ref <30)
Microalb, Ur: 7 mg/dL

## 2023-02-07 ENCOUNTER — Inpatient Hospital Stay: Payer: Medicare HMO | Attending: Hematology & Oncology

## 2023-02-07 ENCOUNTER — Inpatient Hospital Stay: Payer: Medicare HMO

## 2023-02-07 ENCOUNTER — Encounter: Payer: Self-pay | Admitting: *Deleted

## 2023-02-07 ENCOUNTER — Inpatient Hospital Stay: Payer: Medicare HMO | Admitting: Licensed Clinical Social Worker

## 2023-02-07 ENCOUNTER — Inpatient Hospital Stay: Payer: Medicare HMO | Admitting: Family

## 2023-02-07 VITALS — BP 150/78 | HR 60 | Temp 96.8°F | Resp 17 | Ht 71.0 in | Wt 150.0 lb

## 2023-02-07 VITALS — BP 139/73 | HR 59

## 2023-02-07 DIAGNOSIS — Z87442 Personal history of urinary calculi: Secondary | ICD-10-CM

## 2023-02-07 DIAGNOSIS — K5730 Diverticulosis of large intestine without perforation or abscess without bleeding: Secondary | ICD-10-CM

## 2023-02-07 DIAGNOSIS — Z452 Encounter for adjustment and management of vascular access device: Secondary | ICD-10-CM | POA: Diagnosis not present

## 2023-02-07 DIAGNOSIS — I200 Unstable angina: Secondary | ICD-10-CM

## 2023-02-07 DIAGNOSIS — C252 Malignant neoplasm of tail of pancreas: Secondary | ICD-10-CM | POA: Diagnosis not present

## 2023-02-07 DIAGNOSIS — R7303 Prediabetes: Secondary | ICD-10-CM

## 2023-02-07 DIAGNOSIS — M26629 Arthralgia of temporomandibular joint, unspecified side: Secondary | ICD-10-CM

## 2023-02-07 DIAGNOSIS — K219 Gastro-esophageal reflux disease without esophagitis: Secondary | ICD-10-CM

## 2023-02-07 DIAGNOSIS — R634 Abnormal weight loss: Secondary | ICD-10-CM

## 2023-02-07 DIAGNOSIS — I2510 Atherosclerotic heart disease of native coronary artery without angina pectoris: Secondary | ICD-10-CM

## 2023-02-07 DIAGNOSIS — G8929 Other chronic pain: Secondary | ICD-10-CM

## 2023-02-07 DIAGNOSIS — K648 Other hemorrhoids: Secondary | ICD-10-CM

## 2023-02-07 DIAGNOSIS — D45 Polycythemia vera: Secondary | ICD-10-CM

## 2023-02-07 DIAGNOSIS — Z5111 Encounter for antineoplastic chemotherapy: Secondary | ICD-10-CM | POA: Diagnosis present

## 2023-02-07 DIAGNOSIS — R7989 Other specified abnormal findings of blood chemistry: Secondary | ICD-10-CM

## 2023-02-07 DIAGNOSIS — Z1379 Encounter for other screening for genetic and chromosomal anomalies: Secondary | ICD-10-CM

## 2023-02-07 DIAGNOSIS — I10 Essential (primary) hypertension: Secondary | ICD-10-CM

## 2023-02-07 DIAGNOSIS — M1000 Idiopathic gout, unspecified site: Secondary | ICD-10-CM

## 2023-02-07 DIAGNOSIS — I2583 Coronary atherosclerosis due to lipid rich plaque: Secondary | ICD-10-CM

## 2023-02-07 DIAGNOSIS — K831 Obstruction of bile duct: Secondary | ICD-10-CM

## 2023-02-07 DIAGNOSIS — R3129 Other microscopic hematuria: Secondary | ICD-10-CM

## 2023-02-07 DIAGNOSIS — K8689 Other specified diseases of pancreas: Secondary | ICD-10-CM

## 2023-02-07 DIAGNOSIS — I2089 Other forms of angina pectoris: Secondary | ICD-10-CM

## 2023-02-07 DIAGNOSIS — M545 Low back pain, unspecified: Secondary | ICD-10-CM

## 2023-02-07 DIAGNOSIS — E785 Hyperlipidemia, unspecified: Secondary | ICD-10-CM

## 2023-02-07 DIAGNOSIS — C259 Malignant neoplasm of pancreas, unspecified: Secondary | ICD-10-CM | POA: Insufficient documentation

## 2023-02-07 DIAGNOSIS — Z860100 Personal history of colon polyps, unspecified: Secondary | ICD-10-CM

## 2023-02-07 LAB — CBC WITH DIFFERENTIAL (CANCER CENTER ONLY)
Abs Immature Granulocytes: 0.03 10*3/uL (ref 0.00–0.07)
Basophils Absolute: 0 10*3/uL (ref 0.0–0.1)
Basophils Relative: 1 %
Eosinophils Absolute: 0.2 10*3/uL (ref 0.0–0.5)
Eosinophils Relative: 3 %
HCT: 38.2 % — ABNORMAL LOW (ref 39.0–52.0)
Hemoglobin: 13 g/dL (ref 13.0–17.0)
Immature Granulocytes: 1 %
Lymphocytes Relative: 22 %
Lymphs Abs: 1.3 10*3/uL (ref 0.7–4.0)
MCH: 34 pg (ref 26.0–34.0)
MCHC: 34 g/dL (ref 30.0–36.0)
MCV: 100 fL (ref 80.0–100.0)
Monocytes Absolute: 0.9 10*3/uL (ref 0.1–1.0)
Monocytes Relative: 16 %
Neutro Abs: 3.4 10*3/uL (ref 1.7–7.7)
Neutrophils Relative %: 57 %
Platelet Count: 159 10*3/uL (ref 150–400)
RBC: 3.82 MIL/uL — ABNORMAL LOW (ref 4.22–5.81)
RDW: 14.2 % (ref 11.5–15.5)
WBC Count: 5.9 10*3/uL (ref 4.0–10.5)
nRBC: 0 % (ref 0.0–0.2)

## 2023-02-07 LAB — CMP (CANCER CENTER ONLY)
ALT: 15 U/L (ref 0–44)
AST: 18 U/L (ref 15–41)
Albumin: 3.9 g/dL (ref 3.5–5.0)
Alkaline Phosphatase: 108 U/L (ref 38–126)
Anion gap: 6 (ref 5–15)
BUN: 17 mg/dL (ref 8–23)
CO2: 28 mmol/L (ref 22–32)
Calcium: 9.3 mg/dL (ref 8.9–10.3)
Chloride: 104 mmol/L (ref 98–111)
Creatinine: 1.19 mg/dL (ref 0.61–1.24)
GFR, Estimated: >60 mL/min (ref >60)
Glucose, Bld: 207 mg/dL — ABNORMAL HIGH (ref 70–99)
Potassium: 4.4 mmol/L (ref 3.5–5.1)
Sodium: 138 mmol/L (ref 135–145)
Total Bilirubin: 0.5 mg/dL (ref 0.0–1.2)
Total Protein: 6.5 g/dL (ref 6.5–8.1)

## 2023-02-07 LAB — LACTATE DEHYDROGENASE: LDH: 177 U/L (ref 98–192)

## 2023-02-07 MED ORDER — OXALIPLATIN CHEMO INJECTION 100 MG/20ML
68.0000 mg/m2 | Freq: Once | INTRAVENOUS | Status: AC
  Administered 2023-02-07: 120 mg via INTRAVENOUS
  Filled 2023-02-07: qty 20

## 2023-02-07 MED ORDER — SODIUM CHLORIDE 0.9 % IV SOLN
120.0000 mg/m2 | Freq: Once | INTRAVENOUS | Status: AC
  Administered 2023-02-07: 200 mg via INTRAVENOUS
  Filled 2023-02-07: qty 10

## 2023-02-07 MED ORDER — PALONOSETRON HCL INJECTION 0.25 MG/5ML
0.2500 mg | Freq: Once | INTRAVENOUS | Status: AC
  Administered 2023-02-07: 0.25 mg via INTRAVENOUS
  Filled 2023-02-07: qty 5

## 2023-02-07 MED ORDER — ATROPINE SULFATE 1 MG/ML IV SOLN
0.5000 mg | Freq: Once | INTRAVENOUS | Status: AC | PRN
  Administered 2023-02-07: 0.5 mg via INTRAVENOUS
  Filled 2023-02-07: qty 1

## 2023-02-07 MED ORDER — DEXAMETHASONE SODIUM PHOSPHATE 10 MG/ML IJ SOLN
10.0000 mg | Freq: Once | INTRAMUSCULAR | Status: AC
  Administered 2023-02-07: 10 mg via INTRAVENOUS
  Filled 2023-02-07: qty 1

## 2023-02-07 MED ORDER — DEXTROSE 5 % IV SOLN
INTRAVENOUS | Status: DC
Start: 2023-02-07 — End: 2023-02-07

## 2023-02-07 MED ORDER — SODIUM CHLORIDE 0.9 % IV SOLN
150.0000 mg | Freq: Once | INTRAVENOUS | Status: AC
  Administered 2023-02-07: 150 mg via INTRAVENOUS
  Filled 2023-02-07: qty 150

## 2023-02-07 MED ORDER — SODIUM CHLORIDE 0.9 % IV SOLN
1920.0000 mg/m2 | INTRAVENOUS | Status: DC
  Administered 2023-02-07: 3500 mg via INTRAVENOUS
  Filled 2023-02-07: qty 70

## 2023-02-07 MED ORDER — SODIUM CHLORIDE 0.9 % IV SOLN
400.0000 mg/m2 | Freq: Once | INTRAVENOUS | Status: AC
  Administered 2023-02-07: 692 mg via INTRAVENOUS
  Filled 2023-02-07: qty 34.6

## 2023-02-07 NOTE — Addendum Note (Signed)
 Addended by: Ivor Mars on: 02/07/2023 10:54 AM   Modules accepted: Orders

## 2023-02-07 NOTE — Progress Notes (Signed)
 Hematology and Oncology Follow Up Visit  Alexander [name] [id number] 02-15-1940 83 y.o. 02/07/2023   Principle Diagnosis:  Adenocarcinoma of the pancreas-stage Ib (T2N0M0) --clinical stage --BRCA (-), KRAS (+), HRD(-)   Current Therapy:        Status post biliary stent placement FOLFIRINOX -- Neoadj - s/p cycle #3/4  - start on 10/31/2022   Interim History:  Alexander [name] is here today for follow-up and treatment.  He was seen at [facility] Dr. DOROTHA [name] on 01/29/2023 and repeat scans showed stable/slightly smaller pancreatic lesion. He also had some lung nodules that were similar in size that they continue to follow-up. At this time he is still not considered a candidate for surgery.  He will continue on with another 7 cycles for a total of 12 cycles and then Surgery will see him again for repeat imaging. Hopefully at that time he will be able to move forward with surgery.  CA 19-9 continues to come down nicely.  He is doing well. His only complaint is that some days his taste is off. He keeps himself on an eating schedule and is staying well hydrated.  Weight is 150 lbs.  No fever, chills, n/v, cough, rash, dizziness, SOB, chest pain, palpitations, abdominal pain or changes in bowel or bladder habits.  No swelling, tenderness, numbness or tingling in his extremities at this time.  No falls or syncope reported.  He is walking at least 3 miles every day.   ECOG Performance Status: 1 - Symptomatic but completely ambulatory  Medications:  Allergies as of 02/07/2023       Reactions   Metformin  And Related Diarrhea   Metformin  Hcl Diarrhea        Medication List        Accurate as of February 07, 2023 10:25 AM. If you have any questions, ask your nurse or doctor.          Accu-Chek Aviva Plus w/Device Kit Use to test blood glucose once daily   Accu-Chek Softclix Lancets lancets Use to test blood glucose once daily   amLODipine  10 MG tablet Commonly known as: NORVASC  Take 1  tablet (10 mg total) by mouth daily.   Embrace Pro Glucose Control Liqd Use to check blood glucose TID   Embrace Pro Glucose Test test strip Generic drug: glucose blood Use as instructed   FreeStyle Libre 3 Sensor Misc 1 Device by Does not apply route every 14 (fourteen) days. Place 1 sensor on the skin every 14 days. Use to check glucose continuously   Gvoke HypoPen  1-Pack 1 MG/0.2ML Soaj Generic drug: Glucagon  Inject 1 mg into the skin as needed (low blood sugar with impaired consciousness).   insulin  lispro 100 UNIT/ML KwikPen Commonly known as: HumaLOG  KwikPen 2-3 units with meals 3 times a day plus sliding scale as instructed, maximum 20 units / day.   Insulin  Pen Needle 32G X 4 MM Misc Use 4x a day   INSULIN  SYRINGE .3CC/30GX5/16 30G X 5/16 0.3 ML Misc Use for insulin  administration three times a day   Lantus  SoloStar 100 UNIT/ML Solostar Pen Generic drug: insulin  glargine INJECT 20 UNITS INTO THE SKIN 2 TIMES A DAY   lipase/protease/amylase 63999 UNITS Cpep capsule Commonly known as: Creon  Take 2 capsules (72,000 Units total) by mouth 3 (three) times daily with meals. May also take 1 capsule (36,000 Units total) as needed (with snacks - up to 4 snacks daily).   loperamide  2 MG capsule Commonly known as: IMODIUM  Take 2  tabs by mouth with first loose stool, then 1 tab with each additional loose stool as needed. Do not exceed 8 tabs in a 24-hour period   NovoLOG  FlexPen 100 UNIT/ML FlexPen Generic drug: insulin  aspart 2-3 units with meals 3 times a day plus sliding scale as instructed, maximum 20units/day   OLANZapine  5 MG tablet Commonly known as: ZYPREXA  Take 1 tablet (5 mg total) by mouth at bedtime.   ondansetron  8 MG tablet Commonly known as: Zofran  Take 1 tablet (8 mg total) by mouth every 8 (eight) hours as needed for nausea or vomiting. Start on the third day after chemotherapy   pantoprazole  40 MG tablet Commonly known as: PROTONIX  Take 1 tablet (40  mg total) by mouth 2 (two) times daily before a meal.   prochlorperazine  10 MG tablet Commonly known as: COMPAZINE  Take 1 tablet (10 mg total) by mouth every 6 (six) hours as needed for nausea or vomiting (Nausea or vomiting).        Allergies:  Allergies  Allergen Reactions   Metformin  And Related Diarrhea   Metformin  Hcl Diarrhea    Past Medical History, Surgical history, Social history, and Family History were reviewed and updated.  Review of Systems: All other 10 point review of systems is negative.   Physical Exam:  vitals were not taken for this visit.   Wt Readings from Last 3 Encounters:  01/31/23 153 lb 3.2 oz (69.5 kg)  01/16/23 149 lb (67.6 kg)  01/02/23 152 lb (68.9 kg)    Ocular: Sclerae unicteric, pupils equal, round and reactive to light Ear-nose-throat: Oropharynx clear, dentition fair Lymphatic: No cervical or supraclavicular adenopathy Lungs no rales or rhonchi, good excursion bilaterally Heart regular rate and rhythm, no murmur appreciated Abd soft, nontender, positive bowel sounds MSK no focal spinal tenderness, no joint edema Neuro: non-focal, well-oriented, appropriate affect Breasts: Deferred   Lab Results  Component Value Date   WBC 6.5 01/16/2023   HGB 12.2 (L) 01/16/2023   HCT 36.3 (L) 01/16/2023   MCV 101.4 (H) 01/16/2023   PLT 187 01/16/2023   Lab Results  Component Value Date   FERRITIN 240 08/30/2022   IRON 103 08/30/2022   TIBC 305 08/30/2022   UIBC 202 08/30/2022   IRONPCTSAT 34 08/30/2022   Lab Results  Component Value Date   RETICCTPCT 0.7 08/30/2022   RBC 3.58 (L) 01/16/2023   RETICCTABS 32.6 12/19/2013   No results found for: KPAFRELGTCHN, LAMBDASER, KAPLAMBRATIO No results found for: IGGSERUM, IGA, IGMSERUM No results found for: Alexander [name], Alexander [name]   Chemistry      Component Value Date/Time   NA 141 01/16/2023 0955   NA 144  11/29/2016 1054   NA 139 12/29/2015 0954   K 3.6 01/16/2023 0955   K 4.2 11/29/2016 1054   K 4.1 12/29/2015 0954   CL 109 01/16/2023 0955   CL 102 11/29/2016 1054   CO2 25 01/16/2023 0955   CO2 30 11/29/2016 1054   CO2 26 12/29/2015 0954   BUN 19 01/16/2023 0955   BUN 22 11/29/2016 1054   BUN 20.0 12/29/2015 0954   CREATININE 1.20 01/16/2023 0955   CREATININE 1.2 11/29/2016 1054   CREATININE 1.3 12/29/2015 0954      Component Value Date/Time   CALCIUM  8.9 01/16/2023 0955   CALCIUM  9.3 11/29/2016 1054   CALCIUM  9.5 12/29/2015 0954   ALKPHOS 96 01/16/2023 0955   ALKPHOS 92 (H) 11/29/2016 1054   ALKPHOS 98 12/29/2015 0954  AST 13 (L) 01/16/2023 0955   AST 16 12/29/2015 0954   ALT 16 01/16/2023 0955   ALT 20 11/29/2016 1054   ALT 13 12/29/2015 0954   BILITOT 0.5 01/16/2023 0955   BILITOT 0.71 12/29/2015 0954       Impression and Plan: Mr. Shedden is a pleasant 83 yo gentleman with original diagnosis of  polycythemia vera and now early stage pancreatic cancer.  CA 19-9 last visit was down to 47 (previously 74).  We will proceed with treatment cycle 6/12 today as planned. Follow-up in 2 weeks.   Lauraine Pepper, NP 2/5/202510:25 AM

## 2023-02-07 NOTE — Progress Notes (Signed)
 Patient will continue on current treatment plan for total of 12 cycles and then reconsideration of surgery. Will proceed with cycle six today.   Oncology Nurse Navigator Documentation     02/07/2023   10:45 AM  Oncology Nurse Navigator Flowsheets  Navigator Follow Up Date: 02/21/2023  Navigator Follow Up Reason: Follow-up Appointment;Chemotherapy  Navigator Location [facility]  Navigator Encounter Type Follow-up Appt;Treatment  Patient Visit Type MedOnc  Treatment Phase Active Tx  Barriers/Navigation Needs Coordination of Care;Education  Interventions Psycho-Social Support  Acuity Level 2-Minimal Needs (1-2 Barriers Identified)  Support Groups/Services Friends and Family  Time Spent with Patient 15

## 2023-02-07 NOTE — Patient Instructions (Signed)

## 2023-02-07 NOTE — Progress Notes (Signed)
 [facility] CSW Progress Note  Visual Merchandiser met with patient in infusion to assess needs.  He reports minimal side effects from his treatment.  He continues walking as exercise.  His family is very supportive.  His granddaughter is currently staying with him until the end of the month.  Provided him with free massage certificates.  Patient expressed no other needs at this time.    Alexander [name] Au, LCSW Clinical Social Worker [facility]

## 2023-02-07 NOTE — Patient Instructions (Signed)
 [facility]  Discharge Instructions: Thank you for choosing [facility] to provide your oncology and hematology care.   If you have a lab appointment with the Cancer Center, please go directly to the Cancer Center and check in at the registration area.  Wear comfortable clothing and clothing appropriate for easy access to any Portacath or PICC line.   We strive to give you quality time with your provider. You may need to reschedule your appointment if you arrive late (15 or more minutes).  Arriving late affects you and other patients whose appointments are after yours.  Also, if you miss three or more appointments without notifying the office, you may be dismissed from the clinic at the provider's discretion.      For prescription refill requests, have your pharmacy contact our office and allow 72 hours for refills to be completed.    Today you received the following chemotherapy and/or immunotherapy agents Oxaliplatin/Irinotecan/Leucovorin/5 FU       To help prevent nausea and vomiting after your treatment, we encourage you to take your nausea medication as directed.  BELOW ARE SYMPTOMS THAT SHOULD BE REPORTED IMMEDIATELY: *FEVER GREATER THAN 100.4 F (38 C) OR HIGHER *CHILLS OR SWEATING *NAUSEA AND VOMITING THAT IS NOT CONTROLLED WITH YOUR NAUSEA MEDICATION *UNUSUAL SHORTNESS OF BREATH *UNUSUAL BRUISING OR BLEEDING *URINARY PROBLEMS (pain or burning when urinating, or frequent urination) *BOWEL PROBLEMS (unusual diarrhea, constipation, pain near the anus) TENDERNESS IN MOUTH AND THROAT WITH OR WITHOUT PRESENCE OF ULCERS (sore throat, sores in mouth, or a toothache) UNUSUAL RASH, SWELLING OR PAIN  UNUSUAL VAGINAL DISCHARGE OR ITCHING   Items with * indicate a potential emergency and should be followed up as soon as possible or go to the Emergency Department if any problems should occur.  Please show the CHEMOTHERAPY  ALERT CARD or IMMUNOTHERAPY ALERT CARD at check-in to the Emergency Department and triage nurse. Should you have questions after your visit or need to cancel or reschedule your appointment, please contact [facility] - A DEPT OF Eligha Bridegroom [facility]  [phone] and follow the prompts.  Office hours are 8:00 a.m. to 4:30 p.m. Monday - Friday. Please note that voicemails left after 4:00 p.m. may not be returned until the following business day.  We are closed weekends and major holidays. You have access to a nurse at all times for urgent questions. Please call the main number to the clinic [phone] and follow the prompts.  For any non-urgent questions, you may also contact your provider using MyChart. We now offer e-Visits for anyone 57 and older to request care online for non-urgent symptoms. For details visit [email or web address].   Also download the MyChart app! Go to the app store, search "MyChart", open the app, select [place], and log in with your MyChart username and password.

## 2023-02-08 ENCOUNTER — Other Ambulatory Visit: Payer: Medicare HMO

## 2023-02-08 ENCOUNTER — Ambulatory Visit: Payer: Medicare HMO | Admitting: Hematology & Oncology

## 2023-02-09 ENCOUNTER — Inpatient Hospital Stay: Payer: Medicare HMO

## 2023-02-09 VITALS — BP 142/68 | HR 58 | Temp 98.2°F | Resp 20

## 2023-02-09 DIAGNOSIS — C252 Malignant neoplasm of tail of pancreas: Secondary | ICD-10-CM

## 2023-02-09 DIAGNOSIS — Z5111 Encounter for antineoplastic chemotherapy: Secondary | ICD-10-CM | POA: Diagnosis not present

## 2023-02-09 MED ORDER — SODIUM CHLORIDE 0.9% FLUSH
10.0000 mL | INTRAVENOUS | Status: DC | PRN
  Administered 2023-02-09: 10 mL

## 2023-02-09 MED ORDER — HEPARIN SOD (PORK) LOCK FLUSH 100 UNIT/ML IV SOLN
500.0000 [IU] | Freq: Once | INTRAVENOUS | Status: AC | PRN
  Administered 2023-02-09: 500 [IU]

## 2023-02-19 ENCOUNTER — Encounter: Payer: Self-pay | Admitting: *Deleted

## 2023-02-21 ENCOUNTER — Inpatient Hospital Stay: Payer: Medicare HMO

## 2023-02-21 ENCOUNTER — Inpatient Hospital Stay: Payer: Medicare HMO | Admitting: Medical Oncology

## 2023-02-23 ENCOUNTER — Inpatient Hospital Stay: Payer: Medicare HMO

## 2023-02-26 ENCOUNTER — Encounter: Payer: Self-pay | Admitting: Adult Health

## 2023-02-27 ENCOUNTER — Encounter: Payer: Self-pay | Admitting: Adult Health

## 2023-02-28 ENCOUNTER — Encounter: Payer: Self-pay | Admitting: Medical Oncology

## 2023-02-28 ENCOUNTER — Inpatient Hospital Stay: Payer: Medicare HMO

## 2023-02-28 ENCOUNTER — Encounter: Payer: Self-pay | Admitting: Adult Health

## 2023-02-28 ENCOUNTER — Encounter: Payer: Self-pay | Admitting: *Deleted

## 2023-02-28 ENCOUNTER — Inpatient Hospital Stay (HOSPITAL_BASED_OUTPATIENT_CLINIC_OR_DEPARTMENT_OTHER): Payer: Medicare HMO | Admitting: Medical Oncology

## 2023-02-28 VITALS — BP 149/82 | HR 57 | Resp 17

## 2023-02-28 VITALS — BP 139/74 | HR 64 | Temp 97.8°F | Resp 17 | Ht 71.0 in | Wt 153.0 lb

## 2023-02-28 DIAGNOSIS — R3129 Other microscopic hematuria: Secondary | ICD-10-CM

## 2023-02-28 DIAGNOSIS — C251 Malignant neoplasm of body of pancreas: Secondary | ICD-10-CM

## 2023-02-28 DIAGNOSIS — K648 Other hemorrhoids: Secondary | ICD-10-CM

## 2023-02-28 DIAGNOSIS — K5730 Diverticulosis of large intestine without perforation or abscess without bleeding: Secondary | ICD-10-CM

## 2023-02-28 DIAGNOSIS — Z87442 Personal history of urinary calculi: Secondary | ICD-10-CM

## 2023-02-28 DIAGNOSIS — K219 Gastro-esophageal reflux disease without esophagitis: Secondary | ICD-10-CM

## 2023-02-28 DIAGNOSIS — R634 Abnormal weight loss: Secondary | ICD-10-CM | POA: Diagnosis not present

## 2023-02-28 DIAGNOSIS — K8689 Other specified diseases of pancreas: Secondary | ICD-10-CM

## 2023-02-28 DIAGNOSIS — Z860100 Personal history of colon polyps, unspecified: Secondary | ICD-10-CM

## 2023-02-28 DIAGNOSIS — I2510 Atherosclerotic heart disease of native coronary artery without angina pectoris: Secondary | ICD-10-CM

## 2023-02-28 DIAGNOSIS — M545 Low back pain, unspecified: Secondary | ICD-10-CM

## 2023-02-28 DIAGNOSIS — R7989 Other specified abnormal findings of blood chemistry: Secondary | ICD-10-CM | POA: Diagnosis not present

## 2023-02-28 DIAGNOSIS — I2089 Other forms of angina pectoris: Secondary | ICD-10-CM

## 2023-02-28 DIAGNOSIS — D45 Polycythemia vera: Secondary | ICD-10-CM

## 2023-02-28 DIAGNOSIS — C252 Malignant neoplasm of tail of pancreas: Secondary | ICD-10-CM | POA: Diagnosis not present

## 2023-02-28 DIAGNOSIS — E785 Hyperlipidemia, unspecified: Secondary | ICD-10-CM

## 2023-02-28 DIAGNOSIS — Z5111 Encounter for antineoplastic chemotherapy: Secondary | ICD-10-CM | POA: Diagnosis not present

## 2023-02-28 DIAGNOSIS — I200 Unstable angina: Secondary | ICD-10-CM

## 2023-02-28 DIAGNOSIS — K831 Obstruction of bile duct: Secondary | ICD-10-CM

## 2023-02-28 DIAGNOSIS — C250 Malignant neoplasm of head of pancreas: Secondary | ICD-10-CM

## 2023-02-28 DIAGNOSIS — R7303 Prediabetes: Secondary | ICD-10-CM

## 2023-02-28 DIAGNOSIS — Z1379 Encounter for other screening for genetic and chromosomal anomalies: Secondary | ICD-10-CM

## 2023-02-28 DIAGNOSIS — I2583 Coronary atherosclerosis due to lipid rich plaque: Secondary | ICD-10-CM

## 2023-02-28 DIAGNOSIS — G8929 Other chronic pain: Secondary | ICD-10-CM

## 2023-02-28 DIAGNOSIS — M26629 Arthralgia of temporomandibular joint, unspecified side: Secondary | ICD-10-CM

## 2023-02-28 DIAGNOSIS — I10 Essential (primary) hypertension: Secondary | ICD-10-CM

## 2023-02-28 DIAGNOSIS — M1000 Idiopathic gout, unspecified site: Secondary | ICD-10-CM

## 2023-02-28 LAB — CBC WITH DIFFERENTIAL (CANCER CENTER ONLY)
Abs Immature Granulocytes: 0.03 10*3/uL (ref 0.00–0.07)
Basophils Absolute: 0 10*3/uL (ref 0.0–0.1)
Basophils Relative: 1 %
Eosinophils Absolute: 0.1 10*3/uL (ref 0.0–0.5)
Eosinophils Relative: 3 %
HCT: 35.8 % — ABNORMAL LOW (ref 39.0–52.0)
Hemoglobin: 12 g/dL — ABNORMAL LOW (ref 13.0–17.0)
Immature Granulocytes: 1 %
Lymphocytes Relative: 30 %
Lymphs Abs: 1.6 10*3/uL (ref 0.7–4.0)
MCH: 33.3 pg (ref 26.0–34.0)
MCHC: 33.5 g/dL (ref 30.0–36.0)
MCV: 99.4 fL (ref 80.0–100.0)
Monocytes Absolute: 1.1 10*3/uL — ABNORMAL HIGH (ref 0.1–1.0)
Monocytes Relative: 21 %
Neutro Abs: 2.4 10*3/uL (ref 1.7–7.7)
Neutrophils Relative %: 44 %
Platelet Count: 179 10*3/uL (ref 150–400)
RBC: 3.6 MIL/uL — ABNORMAL LOW (ref 4.22–5.81)
RDW: 14.7 % (ref 11.5–15.5)
WBC Count: 5.2 10*3/uL (ref 4.0–10.5)
nRBC: 0 % (ref 0.0–0.2)

## 2023-02-28 LAB — CMP (CANCER CENTER ONLY)
ALT: 15 U/L (ref 0–44)
AST: 15 U/L (ref 15–41)
Albumin: 3.9 g/dL (ref 3.5–5.0)
Alkaline Phosphatase: 103 U/L (ref 38–126)
Anion gap: 7 (ref 5–15)
BUN: 19 mg/dL (ref 8–23)
CO2: 26 mmol/L (ref 22–32)
Calcium: 9.1 mg/dL (ref 8.9–10.3)
Chloride: 105 mmol/L (ref 98–111)
Creatinine: 1.06 mg/dL (ref 0.61–1.24)
GFR, Estimated: >60 mL/min (ref >60)
Glucose, Bld: 222 mg/dL — ABNORMAL HIGH (ref 70–99)
Potassium: 4.1 mmol/L (ref 3.5–5.1)
Sodium: 138 mmol/L (ref 135–145)
Total Bilirubin: 0.6 mg/dL (ref 0.0–1.2)
Total Protein: 5.9 g/dL — ABNORMAL LOW (ref 6.5–8.1)

## 2023-02-28 LAB — LACTATE DEHYDROGENASE: LDH: 174 U/L (ref 98–192)

## 2023-02-28 MED ORDER — DEXAMETHASONE SODIUM PHOSPHATE 10 MG/ML IJ SOLN
10.0000 mg | Freq: Once | INTRAMUSCULAR | Status: AC
Start: 2023-02-28 — End: 2023-02-28
  Administered 2023-02-28: 10 mg via INTRAVENOUS
  Filled 2023-02-28: qty 1

## 2023-02-28 MED ORDER — PALONOSETRON HCL INJECTION 0.25 MG/5ML
0.2500 mg | Freq: Once | INTRAVENOUS | Status: AC
  Administered 2023-02-28: 0.25 mg via INTRAVENOUS
  Filled 2023-02-28: qty 5

## 2023-02-28 MED ORDER — ATROPINE SULFATE 1 MG/ML IV SOLN
0.5000 mg | Freq: Once | INTRAVENOUS | Status: AC | PRN
  Administered 2023-02-28: 0.5 mg via INTRAVENOUS
  Filled 2023-02-28: qty 1

## 2023-02-28 MED ORDER — SODIUM CHLORIDE 0.9 % IV SOLN
120.0000 mg/m2 | Freq: Once | INTRAVENOUS | Status: AC
  Administered 2023-02-28: 200 mg via INTRAVENOUS
  Filled 2023-02-28: qty 10

## 2023-02-28 MED ORDER — OXALIPLATIN CHEMO INJECTION 100 MG/20ML
68.0000 mg/m2 | Freq: Once | INTRAVENOUS | Status: AC
  Administered 2023-02-28: 120 mg via INTRAVENOUS
  Filled 2023-02-28: qty 20

## 2023-02-28 MED ORDER — DEXTROSE 5 % IV SOLN: INTRAVENOUS | Status: DC

## 2023-02-28 MED ORDER — SODIUM CHLORIDE 0.9 % IV SOLN
150.0000 mg | Freq: Once | INTRAVENOUS | Status: AC
  Administered 2023-02-28: 150 mg via INTRAVENOUS
  Filled 2023-02-28: qty 150

## 2023-02-28 MED ORDER — SODIUM CHLORIDE 0.9 % IV SOLN
1920.0000 mg/m2 | INTRAVENOUS | Status: DC
  Administered 2023-02-28: 3500 mg via INTRAVENOUS
  Filled 2023-02-28: qty 70

## 2023-02-28 MED ORDER — LEUCOVORIN CALCIUM INJECTION 350 MG
400.0000 mg/m2 | Freq: Once | INTRAMUSCULAR | Status: AC
  Administered 2023-02-28: 692 mg via INTRAVENOUS
  Filled 2023-02-28: qty 34.6

## 2023-02-28 NOTE — Progress Notes (Signed)
 Patient states he took 5 units of Insulin  at 1055 for a continuous blood glucose monitor result of 288. Clent Jacks, PA notified.

## 2023-02-28 NOTE — Telephone Encounter (Signed)
 FYI

## 2023-02-28 NOTE — Patient Instructions (Signed)

## 2023-02-28 NOTE — Progress Notes (Signed)
 Continue Irinotecan 200mg  per MD.  Richardean Sale, RPH, BCPS, BCOP 02/28/2023 12:13 PM

## 2023-02-28 NOTE — Progress Notes (Signed)
 Patient will proceed with cycle seven today.   Oncology Nurse Navigator Documentation     02/28/2023   10:45 AM  Oncology Nurse Navigator Flowsheets  Navigator Follow Up Date: 03/14/2023  Navigator Follow Up Reason: Follow-up Appointment;Chemotherapy  Navigator Radiographer, therapeutic Encounter Type Treatment  Patient Visit Type MedOnc  Treatment Phase Active Tx  Barriers/Navigation Needs Coordination of Care;Education  Interventions Psycho-Social Support  Acuity Level 2-Minimal Needs (1-2 Barriers Identified)  Support Groups/Services Friends and Family  Time Spent with Patient 15

## 2023-02-28 NOTE — Progress Notes (Signed)
 Hematology and Oncology Follow Up Visit  Alexander [name] [id number] [birth year] 83 y.o. 02/28/2023   Principle Diagnosis:  Adenocarcinoma of the pancreas-stage Ib (T2N0M0) --clinical stage --BRCA (-), KRAS (+), HRD(-)   Current Therapy:        Status post biliary stent placement FOLFIRINOX -- Neoadj - s/p cycle #8/12  - start on 10/31/2022   Interim History:  Alexander [name] is here today for follow-up and treatment.   He was seen at [facility] Dr. Levester Fresh on 01/29/2023 and repeat scans showed stable/slightly smaller pancreatic lesion. He also had some lung nodules that were similar in size. At this time he is still not considered a candidate for surgery. Plan is to continue on for a total of 12 cycles then follow up imaging. Hopefully at that time he will be able to move forward with surgery.  CA 19-9 continues to come down nicely.  His appetite is still off but he has pushed himself to eat more. He has gained 3 pounds.  No fever, chills, n/v, cough, rash, dizziness, SOB, chest pain, palpitations, abdominal pain or changes in bowel or bladder habits.  No swelling, tenderness, numbness or tingling in his extremities at this time.  No falls or syncope reported.  He is walking at least 3 miles every day.   ECOG Performance Status: 1 - Symptomatic but completely ambulatory Wt Readings from Last 3 Encounters:  02/28/23 153 lb (69.4 kg)  02/07/23 150 lb (68 kg)  01/31/23 153 lb 3.2 oz (69.5 kg)   Medications:  Allergies as of 02/28/2023       Reactions   Metformin And Related Diarrhea   Metformin Hcl Diarrhea        Medication List        Accurate as of February 28, 2023 10:49 AM. If you have any questions, ask your nurse or doctor.          Accu-Chek Softclix Lancets lancets Use to test blood glucose once daily   amLODipine 10 MG tablet Commonly known as: NORVASC Take 1 tablet (10 mg total) by mouth daily.   Embrace Pro Glucose Test test strip Generic drug: glucose  blood Use as instructed   FreeStyle Libre 3 Sensor Misc 1 Device by Does not apply route every 14 (fourteen) days. Place 1 sensor on the skin every 14 days. Use to check glucose continuously   Gvoke HypoPen 1-Pack 1 MG/0.2ML Soaj Generic drug: Glucagon Inject 1 mg into the skin as needed (low blood sugar with impaired consciousness).   insulin lispro 100 UNIT/ML KwikPen Commonly known as: HumaLOG KwikPen 2-3 units with meals 3 times a day plus sliding scale as instructed, maximum 20 units / day.   Insulin Pen Needle 32G X 4 MM Misc Use 4x a day   INSULIN SYRINGE .3CC/30GX5/16" 30G X 5/16" 0.3 ML Misc Use for insulin administration three times a day   Lantus SoloStar 100 UNIT/ML Solostar Pen Generic drug: insulin glargine INJECT 20 UNITS INTO THE SKIN 2 TIMES A DAY What changed: additional instructions   lipase/protease/amylase 40981 UNITS Cpep capsule Commonly known as: Creon Take 2 capsules (72,000 Units total) by mouth 3 (three) times daily with meals. May also take 1 capsule (36,000 Units total) as needed (with snacks - up to 4 snacks daily).   loperamide 2 MG capsule Commonly known as: IMODIUM Take 2 tabs by mouth with first loose stool, then 1 tab with each additional loose stool as needed. Do not exceed 8 tabs in a  24-hour period   NovoLOG FlexPen 100 UNIT/ML FlexPen Generic drug: insulin aspart 2-3 units with meals 3 times a day plus sliding scale as instructed, maximum 20units/day What changed: additional instructions   OLANZapine 5 MG tablet Commonly known as: ZYPREXA Take 1 tablet (5 mg total) by mouth at bedtime.   ondansetron 8 MG tablet Commonly known as: Zofran Take 1 tablet (8 mg total) by mouth every 8 (eight) hours as needed for nausea or vomiting. Start on the third day after chemotherapy   pantoprazole 40 MG tablet Commonly known as: PROTONIX Take 1 tablet (40 mg total) by mouth 2 (two) times daily before a meal.   prochlorperazine 10 MG  tablet Commonly known as: COMPAZINE Take 1 tablet (10 mg total) by mouth every 6 (six) hours as needed for nausea or vomiting (Nausea or vomiting).        Allergies:  Allergies  Allergen Reactions   Metformin And Related Diarrhea   Metformin Hcl Diarrhea    Past Medical History, Surgical history, Social history, and Family History were reviewed and updated.  Review of Systems: All other 10 point review of systems is negative.   Physical Exam:  height is 5\' 11"  (1.803 m) and weight is 153 lb (69.4 kg). His oral temperature is 97.8 F (36.6 C). His blood pressure is 139/74 and his pulse is 64. His respiration is 17 and oxygen saturation is 100%.   Wt Readings from Last 3 Encounters:  02/28/23 153 lb (69.4 kg)  02/07/23 150 lb (68 kg)  01/31/23 153 lb 3.2 oz (69.5 kg)    Ocular: Sclerae unicteric, pupils equal, round and reactive to light Ear-nose-throat: Oropharynx clear, dentition fair Lymphatic: No cervical or supraclavicular adenopathy Lungs no rales or rhonchi, good excursion bilaterally Heart regular rate and rhythm, no murmur appreciated Abd soft, nontender, positive bowel sounds MSK no focal spinal tenderness, no joint edema Neuro: non-focal, well-oriented, appropriate affect  Lab Results  Component Value Date   WBC 5.2 02/28/2023   HGB 12.0 (L) 02/28/2023   HCT 35.8 (L) 02/28/2023   MCV 99.4 02/28/2023   PLT 179 02/28/2023   Lab Results  Component Value Date   FERRITIN 240 08/30/2022   IRON 103 08/30/2022   TIBC 305 08/30/2022   UIBC 202 08/30/2022   IRONPCTSAT 34 08/30/2022   Lab Results  Component Value Date   RETICCTPCT 0.7 08/30/2022   RBC 3.60 (L) 02/28/2023   RETICCTABS 32.6 12/19/2013   No results found for: "KPAFRELGTCHN", "LAMBDASER", "KAPLAMBRATIO" No results found for: "IGGSERUM", "IGA", "IGMSERUM" No results found for: "TOTALPROTELP", "ALBUMINELP", "A1GS", "A2GS", "BETS", "BETA2SER", "GAMS", "MSPIKE", "SPEI"   Chemistry       Component Value Date/Time   NA 138 02/07/2023 1020   NA 144 11/29/2016 1054   NA 139 12/29/2015 0954   K 4.4 02/07/2023 1020   K 4.2 11/29/2016 1054   K 4.1 12/29/2015 0954   CL 104 02/07/2023 1020   CL 102 11/29/2016 1054   CO2 28 02/07/2023 1020   CO2 30 11/29/2016 1054   CO2 26 12/29/2015 0954   BUN 17 02/07/2023 1020   BUN 22 11/29/2016 1054   BUN 20.0 12/29/2015 0954   CREATININE 1.19 02/07/2023 1020   CREATININE 1.2 11/29/2016 1054   CREATININE 1.3 12/29/2015 0954      Component Value Date/Time   CALCIUM 9.3 02/07/2023 1020   CALCIUM 9.3 11/29/2016 1054   CALCIUM 9.5 12/29/2015 0954   ALKPHOS 108 02/07/2023 1020   ALKPHOS 92 (  H) 11/29/2016 1054   ALKPHOS 98 12/29/2015 0954   AST 18 02/07/2023 1020   AST 16 12/29/2015 0954   ALT 15 02/07/2023 1020   ALT 20 11/29/2016 1054   ALT 13 12/29/2015 0954   BILITOT 0.5 02/07/2023 1020   BILITOT 0.71 12/29/2015 0954     Encounter Diagnoses  Name Primary?   Malignant neoplasm of tail of pancreas (HCC) Yes   Loss of weight    Elevated liver function tests    Polycythemia vera (HCC)    Malignant neoplasm of head of pancreas (HCC)    Malignant neoplasm of body of pancreas (HCC)     Impression and Plan: Alexander [name] is a pleasant 83 yo gentleman with original diagnosis of  polycythemia vera and now early stage pancreatic cancer.   CBC stable today. CMP stable- he will use his sliding scale insulin  CA 19-9 last visit was down to 47 (previously 74). Today's value is pending.  We will proceed with treatment cycle 7/12 today as planned.  TX today RTC 2 weeks MD, port labs, TX 8/12  Brand Males [place], [place] 2/26/202510:49 AM

## 2023-02-28 NOTE — Patient Instructions (Signed)
 [facility]  Discharge Instructions: Thank you for choosing [facility] to provide your oncology and hematology care.   If you have a lab appointment with the Cancer Center, please go directly to the Cancer Center and check in at the registration area.  Wear comfortable clothing and clothing appropriate for easy access to any Portacath or PICC line.   We strive to give you quality time with your provider. You may need to reschedule your appointment if you arrive late (15 or more minutes).  Arriving late affects you and other patients whose appointments are after yours.  Also, if you miss three or more appointments without notifying the office, you may be dismissed from the clinic at the provider's discretion.      For prescription refill requests, have your pharmacy contact our office and allow 72 hours for refills to be completed.    Today you received the following chemotherapy and/or immunotherapy agents Oxaliplatin/Irinotecan/Leucovorin/5 FU       To help prevent nausea and vomiting after your treatment, we encourage you to take your nausea medication as directed.  BELOW ARE SYMPTOMS THAT SHOULD BE REPORTED IMMEDIATELY: *FEVER GREATER THAN 100.4 F (38 C) OR HIGHER *CHILLS OR SWEATING *NAUSEA AND VOMITING THAT IS NOT CONTROLLED WITH YOUR NAUSEA MEDICATION *UNUSUAL SHORTNESS OF BREATH *UNUSUAL BRUISING OR BLEEDING *URINARY PROBLEMS (pain or burning when urinating, or frequent urination) *BOWEL PROBLEMS (unusual diarrhea, constipation, pain near the anus) TENDERNESS IN MOUTH AND THROAT WITH OR WITHOUT PRESENCE OF ULCERS (sore throat, sores in mouth, or a toothache) UNUSUAL RASH, SWELLING OR PAIN  UNUSUAL VAGINAL DISCHARGE OR ITCHING   Items with * indicate a potential emergency and should be followed up as soon as possible or go to the Emergency Department if any problems should occur.  Please show the CHEMOTHERAPY  ALERT CARD or IMMUNOTHERAPY ALERT CARD at check-in to the Emergency Department and triage nurse. Should you have questions after your visit or need to cancel or reschedule your appointment, please contact [facility] - A DEPT OF Eligha Bridegroom [facility]  [phone] and follow the prompts.  Office hours are 8:00 a.m. to 4:30 p.m. Monday - Friday. Please note that voicemails left after 4:00 p.m. may not be returned until the following business day.  We are closed weekends and major holidays. You have access to a nurse at all times for urgent questions. Please call the main number to the clinic [phone] and follow the prompts.  For any non-urgent questions, you may also contact your provider using MyChart. We now offer e-Visits for anyone 57 and older to request care online for non-urgent symptoms. For details visit [email or web address].   Also download the MyChart app! Go to the app store, search "MyChart", open the app, select [place], and log in with your MyChart username and password.

## 2023-03-01 ENCOUNTER — Other Ambulatory Visit: Payer: Self-pay | Admitting: Adult Health

## 2023-03-01 DIAGNOSIS — E1169 Type 2 diabetes mellitus with other specified complication: Secondary | ICD-10-CM

## 2023-03-01 LAB — CANCER ANTIGEN 19-9: CA 19-9: 57 U/mL — ABNORMAL HIGH (ref 0–35)

## 2023-03-01 MED ORDER — DROPLET PEN NEEDLES 31G X 6 MM MISC
0 refills | Status: AC
Start: 2023-03-01 — End: ?

## 2023-03-01 MED ORDER — FREESTYLE LIBRE 3 PLUS SENSOR MISC
6 refills | Status: DC
Start: 2023-03-01 — End: 2023-10-12

## 2023-03-01 NOTE — Telephone Encounter (Signed)
 This was taken care of

## 2023-03-02 ENCOUNTER — Other Ambulatory Visit: Payer: Self-pay | Admitting: Adult Health

## 2023-03-02 ENCOUNTER — Inpatient Hospital Stay: Payer: Medicare HMO

## 2023-03-02 VITALS — BP 137/68 | HR 58 | Temp 98.4°F | Resp 17

## 2023-03-02 DIAGNOSIS — E785 Hyperlipidemia, unspecified: Secondary | ICD-10-CM

## 2023-03-02 DIAGNOSIS — K8689 Other specified diseases of pancreas: Secondary | ICD-10-CM

## 2023-03-02 DIAGNOSIS — Z5111 Encounter for antineoplastic chemotherapy: Secondary | ICD-10-CM | POA: Diagnosis not present

## 2023-03-02 DIAGNOSIS — M1000 Idiopathic gout, unspecified site: Secondary | ICD-10-CM

## 2023-03-02 MED ORDER — SODIUM CHLORIDE 0.9% FLUSH
10.0000 mL | Freq: Once | INTRAVENOUS | Status: AC
  Administered 2023-03-02: 10 mL via INTRAVENOUS

## 2023-03-02 MED ORDER — HEPARIN SOD (PORK) LOCK FLUSH 100 UNIT/ML IV SOLN
500.0000 [IU] | Freq: Once | INTRAVENOUS | Status: AC
  Administered 2023-03-02: 500 [IU] via INTRAVENOUS

## 2023-03-02 NOTE — Telephone Encounter (Signed)
 Left message to return phone call.

## 2023-03-02 NOTE — Patient Instructions (Signed)

## 2023-03-02 NOTE — Telephone Encounter (Signed)
  The original prescription was discontinued on 09/22/2022 by Lacie Draft, RN for the following reason: Discontinued by provider. Renewing this prescription may not be appropriate.    Please advise if I need to send in

## 2023-03-05 ENCOUNTER — Encounter: Payer: Self-pay | Admitting: Adult Health

## 2023-03-06 NOTE — Telephone Encounter (Signed)
 This has been taken care of in another encounter.

## 2023-03-06 NOTE — Telephone Encounter (Signed)
 Pt stated that he is no longer taking.

## 2023-03-07 ENCOUNTER — Inpatient Hospital Stay: Payer: Medicare HMO

## 2023-03-07 ENCOUNTER — Inpatient Hospital Stay: Payer: Medicare HMO | Admitting: Hematology & Oncology

## 2023-03-09 ENCOUNTER — Inpatient Hospital Stay: Payer: Medicare HMO

## 2023-03-13 ENCOUNTER — Other Ambulatory Visit: Payer: Self-pay | Admitting: Adult Health

## 2023-03-13 DIAGNOSIS — E1169 Type 2 diabetes mellitus with other specified complication: Secondary | ICD-10-CM

## 2023-03-14 ENCOUNTER — Inpatient Hospital Stay: Payer: Medicare HMO

## 2023-03-14 ENCOUNTER — Inpatient Hospital Stay (HOSPITAL_BASED_OUTPATIENT_CLINIC_OR_DEPARTMENT_OTHER): Payer: Medicare HMO | Admitting: Medical Oncology

## 2023-03-14 ENCOUNTER — Inpatient Hospital Stay: Payer: Medicare HMO | Attending: Hematology & Oncology

## 2023-03-14 ENCOUNTER — Encounter: Payer: Self-pay | Admitting: *Deleted

## 2023-03-14 ENCOUNTER — Encounter: Payer: Self-pay | Admitting: Medical Oncology

## 2023-03-14 VITALS — BP 136/71 | HR 51 | Temp 97.4°F | Resp 18 | Ht 71.0 in | Wt 152.0 lb

## 2023-03-14 DIAGNOSIS — C259 Malignant neoplasm of pancreas, unspecified: Secondary | ICD-10-CM | POA: Insufficient documentation

## 2023-03-14 DIAGNOSIS — C252 Malignant neoplasm of tail of pancreas: Secondary | ICD-10-CM

## 2023-03-14 DIAGNOSIS — I2510 Atherosclerotic heart disease of native coronary artery without angina pectoris: Secondary | ICD-10-CM

## 2023-03-14 DIAGNOSIS — R634 Abnormal weight loss: Secondary | ICD-10-CM | POA: Diagnosis not present

## 2023-03-14 DIAGNOSIS — Z1379 Encounter for other screening for genetic and chromosomal anomalies: Secondary | ICD-10-CM

## 2023-03-14 DIAGNOSIS — M1000 Idiopathic gout, unspecified site: Secondary | ICD-10-CM

## 2023-03-14 DIAGNOSIS — K8689 Other specified diseases of pancreas: Secondary | ICD-10-CM

## 2023-03-14 DIAGNOSIS — Z5111 Encounter for antineoplastic chemotherapy: Secondary | ICD-10-CM | POA: Insufficient documentation

## 2023-03-14 DIAGNOSIS — K5730 Diverticulosis of large intestine without perforation or abscess without bleeding: Secondary | ICD-10-CM

## 2023-03-14 DIAGNOSIS — Z87442 Personal history of urinary calculi: Secondary | ICD-10-CM

## 2023-03-14 DIAGNOSIS — K831 Obstruction of bile duct: Secondary | ICD-10-CM

## 2023-03-14 DIAGNOSIS — D45 Polycythemia vera: Secondary | ICD-10-CM

## 2023-03-14 DIAGNOSIS — G8929 Other chronic pain: Secondary | ICD-10-CM

## 2023-03-14 DIAGNOSIS — Z860100 Personal history of colon polyps, unspecified: Secondary | ICD-10-CM

## 2023-03-14 DIAGNOSIS — M26629 Arthralgia of temporomandibular joint, unspecified side: Secondary | ICD-10-CM

## 2023-03-14 DIAGNOSIS — R7989 Other specified abnormal findings of blood chemistry: Secondary | ICD-10-CM

## 2023-03-14 DIAGNOSIS — I200 Unstable angina: Secondary | ICD-10-CM

## 2023-03-14 DIAGNOSIS — R3129 Other microscopic hematuria: Secondary | ICD-10-CM

## 2023-03-14 DIAGNOSIS — E785 Hyperlipidemia, unspecified: Secondary | ICD-10-CM

## 2023-03-14 DIAGNOSIS — R7303 Prediabetes: Secondary | ICD-10-CM

## 2023-03-14 DIAGNOSIS — K648 Other hemorrhoids: Secondary | ICD-10-CM

## 2023-03-14 DIAGNOSIS — K219 Gastro-esophageal reflux disease without esophagitis: Secondary | ICD-10-CM

## 2023-03-14 DIAGNOSIS — I10 Essential (primary) hypertension: Secondary | ICD-10-CM

## 2023-03-14 DIAGNOSIS — I2089 Other forms of angina pectoris: Secondary | ICD-10-CM

## 2023-03-14 LAB — CBC WITH DIFFERENTIAL (CANCER CENTER ONLY)
Abs Immature Granulocytes: 0.02 10*3/uL (ref 0.00–0.07)
Basophils Absolute: 0 10*3/uL (ref 0.0–0.1)
Basophils Relative: 0 %
Eosinophils Absolute: 0.1 10*3/uL (ref 0.0–0.5)
Eosinophils Relative: 2 %
HCT: 35.9 % — ABNORMAL LOW (ref 39.0–52.0)
Hemoglobin: 11.9 g/dL — ABNORMAL LOW (ref 13.0–17.0)
Immature Granulocytes: 0 %
Lymphocytes Relative: 25 %
Lymphs Abs: 1.3 10*3/uL (ref 0.7–4.0)
MCH: 33 pg (ref 26.0–34.0)
MCHC: 33.1 g/dL (ref 30.0–36.0)
MCV: 99.4 fL (ref 80.0–100.0)
Monocytes Absolute: 0.8 10*3/uL (ref 0.1–1.0)
Monocytes Relative: 15 %
Neutro Abs: 2.9 10*3/uL (ref 1.7–7.7)
Neutrophils Relative %: 58 %
Platelet Count: 149 10*3/uL — ABNORMAL LOW (ref 150–400)
RBC: 3.61 MIL/uL — ABNORMAL LOW (ref 4.22–5.81)
RDW: 14.6 % (ref 11.5–15.5)
WBC Count: 5.2 10*3/uL (ref 4.0–10.5)
nRBC: 0 % (ref 0.0–0.2)

## 2023-03-14 LAB — CMP (CANCER CENTER ONLY)
ALT: 14 U/L (ref 0–44)
AST: 14 U/L — ABNORMAL LOW (ref 15–41)
Albumin: 3.7 g/dL (ref 3.5–5.0)
Alkaline Phosphatase: 102 U/L (ref 38–126)
Anion gap: 8 (ref 5–15)
BUN: 19 mg/dL (ref 8–23)
CO2: 26 mmol/L (ref 22–32)
Calcium: 8.5 mg/dL — ABNORMAL LOW (ref 8.9–10.3)
Chloride: 106 mmol/L (ref 98–111)
Creatinine: 0.97 mg/dL (ref 0.61–1.24)
GFR, Estimated: >60 mL/min (ref >60)
Glucose, Bld: 245 mg/dL — ABNORMAL HIGH (ref 70–99)
Potassium: 3.5 mmol/L (ref 3.5–5.1)
Sodium: 140 mmol/L (ref 135–145)
Total Bilirubin: 0.6 mg/dL (ref 0.0–1.2)
Total Protein: 6.1 g/dL — ABNORMAL LOW (ref 6.5–8.1)

## 2023-03-14 MED ORDER — OXALIPLATIN CHEMO INJECTION 100 MG/20ML
68.0000 mg/m2 | Freq: Once | INTRAVENOUS | Status: AC
  Administered 2023-03-14: 120 mg via INTRAVENOUS
  Filled 2023-03-14: qty 20

## 2023-03-14 MED ORDER — SODIUM CHLORIDE 0.9 % IV SOLN
1920.0000 mg/m2 | INTRAVENOUS | Status: DC
  Administered 2023-03-14: 3500 mg via INTRAVENOUS
  Filled 2023-03-14: qty 70

## 2023-03-14 MED ORDER — DEXAMETHASONE SODIUM PHOSPHATE 10 MG/ML IJ SOLN
10.0000 mg | Freq: Once | INTRAMUSCULAR | Status: AC
  Administered 2023-03-14: 10 mg via INTRAVENOUS
  Filled 2023-03-14: qty 1

## 2023-03-14 MED ORDER — DEXTROSE 5 % IV SOLN: INTRAVENOUS | Status: DC

## 2023-03-14 MED ORDER — SODIUM CHLORIDE 0.9 % IV SOLN
120.0000 mg/m2 | Freq: Once | INTRAVENOUS | Status: AC
  Administered 2023-03-14: 200 mg via INTRAVENOUS
  Filled 2023-03-14: qty 10

## 2023-03-14 MED ORDER — SODIUM CHLORIDE 0.9 % IV SOLN
400.0000 mg/m2 | Freq: Once | INTRAVENOUS | Status: AC
  Administered 2023-03-14: 692 mg via INTRAVENOUS
  Filled 2023-03-14: qty 34.6

## 2023-03-14 MED ORDER — SODIUM CHLORIDE 0.9 % IV SOLN
150.0000 mg | Freq: Once | INTRAVENOUS | Status: AC
  Administered 2023-03-14: 150 mg via INTRAVENOUS
  Filled 2023-03-14: qty 150

## 2023-03-14 MED ORDER — ATROPINE SULFATE 1 MG/ML IV SOLN
0.5000 mg | Freq: Once | INTRAVENOUS | Status: AC | PRN
  Administered 2023-03-14: 0.5 mg via INTRAVENOUS
  Filled 2023-03-14: qty 1

## 2023-03-14 MED ORDER — PALONOSETRON HCL INJECTION 0.25 MG/5ML
0.2500 mg | Freq: Once | INTRAVENOUS | Status: AC
  Administered 2023-03-14: 0.25 mg via INTRAVENOUS
  Filled 2023-03-14: qty 5

## 2023-03-14 NOTE — Progress Notes (Signed)
 Will proceed with cycle 8 today.  Oncology Nurse Navigator Documentation     03/14/2023   11:00 AM  Oncology Nurse Navigator Flowsheets  Navigator Follow Up Date: 03/28/2023  Navigator Follow Up Reason: Follow-up Appointment;Chemotherapy  Navigator Location [facility]  Navigator Encounter Type Treatment  Patient Visit Type MedOnc  Treatment Phase Active Tx  Barriers/Navigation Needs Coordination of Care;Education  Interventions None Required  Acuity Level 2-Minimal Needs (1-2 Barriers Identified)  Support Groups/Services Friends and Family  Time Spent with Patient 15

## 2023-03-14 NOTE — Patient Instructions (Signed)
 [facility]  Discharge Instructions: Thank you for choosing [facility] to provide your oncology and hematology care.   If you have a lab appointment with the Cancer Center, please go directly to the Cancer Center and check in at the registration area.  Wear comfortable clothing and clothing appropriate for easy access to any Portacath or PICC line.   We strive to give you quality time with your provider. You may need to reschedule your appointment if you arrive late (15 or more minutes).  Arriving late affects you and other patients whose appointments are after yours.  Also, if you miss three or more appointments without notifying the office, you may be dismissed from the clinic at the provider's discretion.      For prescription refill requests, have your pharmacy contact our office and allow 72 hours for refills to be completed.    Today you received the following chemotherapy and/or immunotherapy agents Oxaliplatin/Irinotecan/Leucovorin/5 Fu       To help prevent nausea and vomiting after your treatment, we encourage you to take your nausea medication as directed.  BELOW ARE SYMPTOMS THAT SHOULD BE REPORTED IMMEDIATELY: *FEVER GREATER THAN 100.4 F (38 C) OR HIGHER *CHILLS OR SWEATING *NAUSEA AND VOMITING THAT IS NOT CONTROLLED WITH YOUR NAUSEA MEDICATION *UNUSUAL SHORTNESS OF BREATH *UNUSUAL BRUISING OR BLEEDING *URINARY PROBLEMS (pain or burning when urinating, or frequent urination) *BOWEL PROBLEMS (unusual diarrhea, constipation, pain near the anus) TENDERNESS IN MOUTH AND THROAT WITH OR WITHOUT PRESENCE OF ULCERS (sore throat, sores in mouth, or a toothache) UNUSUAL RASH, SWELLING OR PAIN  UNUSUAL VAGINAL DISCHARGE OR ITCHING   Items with * indicate a potential emergency and should be followed up as soon as possible or go to the Emergency Department if any problems should occur.  Please show the CHEMOTHERAPY  ALERT CARD or IMMUNOTHERAPY ALERT CARD at check-in to the Emergency Department and triage nurse. Should you have questions after your visit or need to cancel or reschedule your appointment, please contact [facility] - A DEPT OF Eligha Bridegroom [facility]  [phone] and follow the prompts.  Office hours are 8:00 a.m. to 4:30 p.m. Monday - Friday. Please note that voicemails left after 4:00 p.m. may not be returned until the following business day.  We are closed weekends and major holidays. You have access to a nurse at all times for urgent questions. Please call the main number to the clinic [phone] and follow the prompts.  For any non-urgent questions, you may also contact your provider using MyChart. We now offer e-Visits for anyone 69 and older to request care online for non-urgent symptoms. For details visit [email or web address].   Also download the MyChart app! Go to the app store, search "MyChart", open the app, select [place], and log in with your MyChart username and password.

## 2023-03-14 NOTE — Progress Notes (Signed)
 Hematology and Oncology Follow Up Visit  Alexander [name] [id number] 12-13-1940 83 y.o. 03/14/2023   Principle Diagnosis:  Adenocarcinoma of the pancreas-stage Ib (T2N0M0) --clinical stage --BRCA (-), KRAS (+), HRD(-)   Current Therapy:        Status post biliary stent placement FOLFIRINOX -- Neoadj - s/p cycle #8/12  - start on 10/31/2022   Interim History:  Alexander [name] is here today for follow-up and treatment. He was last seen at [facility] Dr. Levester Fresh on 01/29/2023 and repeat scans showed stable/slightly smaller pancreatic lesion. He also had some lung nodules that were similar in size. At this time he is still not considered a candidate for surgery. Plan is to continue on for a total of 12 cycles then follow up imaging. Hopefully at that time he will be able to move forward with surgery.   Today he states that he is doing ok. He continues to struggle to make himself eat/drink given taste changes with treatment. He is finding his groove though in what he intakes.  CA 19-9 continues to trend downward No fever, chills, n/v, cough, rash, dizziness, SOB, chest pain, palpitations, abdominal pain or changes in bowel or bladder habits.  No swelling, tenderness, numbness or tingling in his extremities at this time.  No falls or syncope reported.  He is walking at least 2-3 miles every day.   ECOG Performance Status: 1 - Symptomatic but completely ambulatory Wt Readings from Last 3 Encounters:  03/14/23 152 lb (68.9 kg)  02/28/23 153 lb (69.4 kg)  02/07/23 150 lb (68 kg)   Medications:  Allergies as of 03/14/2023       Reactions   Metformin And Related Diarrhea   Metformin Hcl Diarrhea        Medication List        Accurate as of March 14, 2023 11:40 AM. If you have any questions, ask your nurse or doctor.          Accu-Chek Softclix Lancets lancets Use to test blood glucose once daily   amLODipine 10 MG tablet Commonly known as: NORVASC Take 1 tablet (10 mg total) by  mouth daily.   Droplet Pen Needles 31G X 6 MM Misc Generic drug: Insulin Pen Needle Use to check blood sugar TID   Embrace Pro Glucose Test test strip Generic drug: glucose blood Use as instructed   FreeStyle Libre 3 Plus Sensor Misc Change sensor every 15 days.   Gvoke HypoPen 1-Pack 1 MG/0.2ML Soaj Generic drug: Glucagon Inject 1 mg into the skin as needed (low blood sugar with impaired consciousness).   insulin lispro 100 UNIT/ML KwikPen Commonly known as: HumaLOG KwikPen 2-3 units with meals 3 times a day plus sliding scale as instructed, maximum 20 units / day.   INSULIN SYRINGE .3CC/30GX5/16" 30G X 5/16" 0.3 ML Misc Use for insulin administration three times a day   Lantus SoloStar 100 UNIT/ML Solostar Pen Generic drug: insulin glargine INJECT 20 UNITS INTO THE SKIN 2 TIMES A DAY What changed: additional instructions   lipase/protease/amylase 78295 UNITS Cpep capsule Commonly known as: Creon Take 2 capsules (72,000 Units total) by mouth 3 (three) times daily with meals. May also take 1 capsule (36,000 Units total) as needed (with snacks - up to 4 snacks daily).   loperamide 2 MG capsule Commonly known as: IMODIUM Take 2 tabs by mouth with first loose stool, then 1 tab with each additional loose stool as needed. Do not exceed 8 tabs in a 24-hour period  NovoLOG FlexPen 100 UNIT/ML FlexPen Generic drug: insulin aspart 2-3 units with meals 3 times a day plus sliding scale as instructed, maximum 20units/day What changed: additional instructions   OLANZapine 5 MG tablet Commonly known as: ZYPREXA Take 1 tablet (5 mg total) by mouth at bedtime.   ondansetron 8 MG tablet Commonly known as: Zofran Take 1 tablet (8 mg total) by mouth every 8 (eight) hours as needed for nausea or vomiting. Start on the third day after chemotherapy   pantoprazole 40 MG tablet Commonly known as: PROTONIX Take 1 tablet (40 mg total) by mouth 2 (two) times daily before a meal.    prochlorperazine 10 MG tablet Commonly known as: COMPAZINE Take 1 tablet (10 mg total) by mouth every 6 (six) hours as needed for nausea or vomiting (Nausea or vomiting).        Allergies:  Allergies  Allergen Reactions   Metformin And Related Diarrhea   Metformin Hcl Diarrhea    Past Medical History, Surgical history, Social history, and Family History were reviewed and updated.  Review of Systems: All other 10 point review of systems is negative.   Physical Exam:  height is 5\' 11"  (1.803 m) and weight is 152 lb (68.9 kg). His oral temperature is 97.4 F (36.3 C) (abnormal). His blood pressure is 136/71 and his pulse is 51 (abnormal). His respiration is 18 and oxygen saturation is 100%.   Wt Readings from Last 3 Encounters:  03/14/23 152 lb (68.9 kg)  02/28/23 153 lb (69.4 kg)  02/07/23 150 lb (68 kg)    Ocular: Sclerae unicteric, pupils equal, round and reactive to light Ear-nose-throat: Oropharynx clear, dentition fair Lymphatic: No cervical or supraclavicular adenopathy Lungs no rales or rhonchi, good excursion bilaterally Heart regular rate and rhythm, no murmur appreciated Abd soft, nontender, positive bowel sounds MSK no focal spinal tenderness, no joint edema Neuro: non-focal, well-oriented, appropriate affect  Lab Results  Component Value Date   WBC 5.2 03/14/2023   HGB 11.9 (L) 03/14/2023   HCT 35.9 (L) 03/14/2023   MCV 99.4 03/14/2023   PLT 149 (L) 03/14/2023   Lab Results  Component Value Date   FERRITIN 240 08/30/2022   IRON 103 08/30/2022   TIBC 305 08/30/2022   UIBC 202 08/30/2022   IRONPCTSAT 34 08/30/2022   Lab Results  Component Value Date   RETICCTPCT 0.7 08/30/2022   RBC 3.61 (L) 03/14/2023   RETICCTABS 32.6 12/19/2013   No results found for: "KPAFRELGTCHN", "LAMBDASER", "KAPLAMBRATIO" No results found for: "IGGSERUM", "IGA", "IGMSERUM" No results found for: "TOTALPROTELP", "ALBUMINELP", "A1GS", "A2GS", "BETS", "BETA2SER", "GAMS",  "MSPIKE", "SPEI"   Chemistry      Component Value Date/Time   NA 140 03/14/2023 1020   NA 144 11/29/2016 1054   NA 139 12/29/2015 0954   K 3.5 03/14/2023 1020   K 4.2 11/29/2016 1054   K 4.1 12/29/2015 0954   CL 106 03/14/2023 1020   CL 102 11/29/2016 1054   CO2 26 03/14/2023 1020   CO2 30 11/29/2016 1054   CO2 26 12/29/2015 0954   BUN 19 03/14/2023 1020   BUN 22 11/29/2016 1054   BUN 20.0 12/29/2015 0954   CREATININE 0.97 03/14/2023 1020   CREATININE 1.2 11/29/2016 1054   CREATININE 1.3 12/29/2015 0954      Component Value Date/Time   CALCIUM 8.5 (L) 03/14/2023 1020   CALCIUM 9.3 11/29/2016 1054   CALCIUM 9.5 12/29/2015 0954   ALKPHOS 102 03/14/2023 1020   ALKPHOS 92 (H) 11/29/2016  1054   ALKPHOS 98 12/29/2015 0954   AST 14 (L) 03/14/2023 1020   AST 16 12/29/2015 0954   ALT 14 03/14/2023 1020   ALT 20 11/29/2016 1054   ALT 13 12/29/2015 0954   BILITOT 0.6 03/14/2023 1020   BILITOT 0.71 12/29/2015 0954     Encounter Diagnoses  Name Primary?   Malignant neoplasm of tail of pancreas (HCC) Yes   Loss of weight     Impression and Plan: Mr. Mincey is a pleasant 83 yo gentleman with original diagnosis of  polycythemia vera and now early stage pancreatic cancer.   CBC stable today. CMP stable- he will use his sliding scale insulin  CA 19-9 last visit was down to 57 (previously 74). Today's value is pending.  We will proceed with treatment cycle 8/12 today as planned. He will continue with his nutritional plan.   TX today RTC 2 weeks MD, port labs (CMP, CBC w/, CA 19-9, LDH), TX 9/12  Brand Males [place], [place] 3/12/202511:40 AM

## 2023-03-14 NOTE — Patient Instructions (Signed)

## 2023-03-16 ENCOUNTER — Inpatient Hospital Stay: Payer: Medicare HMO

## 2023-03-16 VITALS — BP 131/77 | HR 52 | Temp 98.2°F | Resp 18

## 2023-03-16 DIAGNOSIS — C252 Malignant neoplasm of tail of pancreas: Secondary | ICD-10-CM

## 2023-03-16 DIAGNOSIS — Z5111 Encounter for antineoplastic chemotherapy: Secondary | ICD-10-CM | POA: Diagnosis not present

## 2023-03-16 MED ORDER — HEPARIN SOD (PORK) LOCK FLUSH 100 UNIT/ML IV SOLN
500.0000 [IU] | Freq: Once | INTRAVENOUS | Status: AC | PRN
  Administered 2023-03-16: 500 [IU]

## 2023-03-16 MED ORDER — SODIUM CHLORIDE 0.9% FLUSH
10.0000 mL | INTRAVENOUS | Status: DC | PRN
Start: 2023-03-16 — End: 2023-03-16
  Administered 2023-03-16: 10 mL

## 2023-03-28 ENCOUNTER — Inpatient Hospital Stay: Payer: Medicare HMO

## 2023-03-28 ENCOUNTER — Inpatient Hospital Stay (HOSPITAL_BASED_OUTPATIENT_CLINIC_OR_DEPARTMENT_OTHER): Payer: Medicare HMO | Admitting: Hematology & Oncology

## 2023-03-28 ENCOUNTER — Encounter: Payer: Self-pay | Admitting: Hematology & Oncology

## 2023-03-28 ENCOUNTER — Encounter: Payer: Self-pay | Admitting: *Deleted

## 2023-03-28 VITALS — Ht 71.0 in | Wt 153.1 lb

## 2023-03-28 DIAGNOSIS — R3129 Other microscopic hematuria: Secondary | ICD-10-CM

## 2023-03-28 DIAGNOSIS — G8929 Other chronic pain: Secondary | ICD-10-CM

## 2023-03-28 DIAGNOSIS — R7303 Prediabetes: Secondary | ICD-10-CM

## 2023-03-28 DIAGNOSIS — C252 Malignant neoplasm of tail of pancreas: Secondary | ICD-10-CM | POA: Diagnosis not present

## 2023-03-28 DIAGNOSIS — I200 Unstable angina: Secondary | ICD-10-CM

## 2023-03-28 DIAGNOSIS — I10 Essential (primary) hypertension: Secondary | ICD-10-CM

## 2023-03-28 DIAGNOSIS — K831 Obstruction of bile duct: Secondary | ICD-10-CM

## 2023-03-28 DIAGNOSIS — R634 Abnormal weight loss: Secondary | ICD-10-CM

## 2023-03-28 DIAGNOSIS — M26629 Arthralgia of temporomandibular joint, unspecified side: Secondary | ICD-10-CM

## 2023-03-28 DIAGNOSIS — I2510 Atherosclerotic heart disease of native coronary artery without angina pectoris: Secondary | ICD-10-CM

## 2023-03-28 DIAGNOSIS — K648 Other hemorrhoids: Secondary | ICD-10-CM

## 2023-03-28 DIAGNOSIS — R7989 Other specified abnormal findings of blood chemistry: Secondary | ICD-10-CM

## 2023-03-28 DIAGNOSIS — Z5111 Encounter for antineoplastic chemotherapy: Secondary | ICD-10-CM | POA: Diagnosis not present

## 2023-03-28 DIAGNOSIS — K8689 Other specified diseases of pancreas: Secondary | ICD-10-CM

## 2023-03-28 DIAGNOSIS — Z860100 Personal history of colon polyps, unspecified: Secondary | ICD-10-CM

## 2023-03-28 DIAGNOSIS — E785 Hyperlipidemia, unspecified: Secondary | ICD-10-CM

## 2023-03-28 DIAGNOSIS — M1000 Idiopathic gout, unspecified site: Secondary | ICD-10-CM

## 2023-03-28 DIAGNOSIS — K219 Gastro-esophageal reflux disease without esophagitis: Secondary | ICD-10-CM

## 2023-03-28 DIAGNOSIS — Z9221 Personal history of antineoplastic chemotherapy: Secondary | ICD-10-CM | POA: Diagnosis not present

## 2023-03-28 DIAGNOSIS — I2089 Other forms of angina pectoris: Secondary | ICD-10-CM

## 2023-03-28 DIAGNOSIS — Z87442 Personal history of urinary calculi: Secondary | ICD-10-CM

## 2023-03-28 DIAGNOSIS — Z1379 Encounter for other screening for genetic and chromosomal anomalies: Secondary | ICD-10-CM

## 2023-03-28 DIAGNOSIS — K5730 Diverticulosis of large intestine without perforation or abscess without bleeding: Secondary | ICD-10-CM

## 2023-03-28 DIAGNOSIS — D45 Polycythemia vera: Secondary | ICD-10-CM

## 2023-03-28 DIAGNOSIS — R978 Other abnormal tumor markers: Secondary | ICD-10-CM | POA: Diagnosis not present

## 2023-03-28 LAB — CMP (CANCER CENTER ONLY)
ALT: 13 U/L (ref 0–44)
AST: 14 U/L — ABNORMAL LOW (ref 15–41)
Albumin: 3.9 g/dL (ref 3.5–5.0)
Alkaline Phosphatase: 97 U/L (ref 38–126)
Anion gap: 5 (ref 5–15)
BUN: 15 mg/dL (ref 8–23)
CO2: 28 mmol/L (ref 22–32)
Calcium: 8.6 mg/dL — ABNORMAL LOW (ref 8.9–10.3)
Chloride: 105 mmol/L (ref 98–111)
Creatinine: 1.08 mg/dL (ref 0.61–1.24)
GFR, Estimated: >60 mL/min (ref >60)
Glucose, Bld: 270 mg/dL — ABNORMAL HIGH (ref 70–99)
Potassium: 4.2 mmol/L (ref 3.5–5.1)
Sodium: 138 mmol/L (ref 135–145)
Total Bilirubin: 0.5 mg/dL (ref 0.0–1.2)
Total Protein: 5.8 g/dL — ABNORMAL LOW (ref 6.5–8.1)

## 2023-03-28 LAB — CBC WITH DIFFERENTIAL (CANCER CENTER ONLY)
Abs Immature Granulocytes: 0.03 10*3/uL (ref 0.00–0.07)
Basophils Absolute: 0 10*3/uL (ref 0.0–0.1)
Basophils Relative: 0 %
Eosinophils Absolute: 0.1 10*3/uL (ref 0.0–0.5)
Eosinophils Relative: 2 %
HCT: 34.8 % — ABNORMAL LOW (ref 39.0–52.0)
Hemoglobin: 11.6 g/dL — ABNORMAL LOW (ref 13.0–17.0)
Immature Granulocytes: 1 %
Lymphocytes Relative: 21 %
Lymphs Abs: 1.3 10*3/uL (ref 0.7–4.0)
MCH: 33.6 pg (ref 26.0–34.0)
MCHC: 33.3 g/dL (ref 30.0–36.0)
MCV: 100.9 fL — ABNORMAL HIGH (ref 80.0–100.0)
Monocytes Absolute: 1 10*3/uL (ref 0.1–1.0)
Monocytes Relative: 16 %
Neutro Abs: 3.9 10*3/uL (ref 1.7–7.7)
Neutrophils Relative %: 60 %
Platelet Count: 131 10*3/uL — ABNORMAL LOW (ref 150–400)
RBC: 3.45 MIL/uL — ABNORMAL LOW (ref 4.22–5.81)
RDW: 15.2 % (ref 11.5–15.5)
WBC Count: 6.4 10*3/uL (ref 4.0–10.5)
nRBC: 0 % (ref 0.0–0.2)

## 2023-03-28 LAB — LACTATE DEHYDROGENASE: LDH: 158 U/L (ref 98–192)

## 2023-03-28 MED ORDER — SODIUM CHLORIDE 0.9 % IV SOLN
400.0000 mg/m2 | Freq: Once | INTRAVENOUS | Status: AC
  Administered 2023-03-28: 692 mg via INTRAVENOUS
  Filled 2023-03-28: qty 34.6

## 2023-03-28 MED ORDER — OXALIPLATIN CHEMO INJECTION 100 MG/20ML
68.0000 mg/m2 | Freq: Once | INTRAVENOUS | Status: AC
  Administered 2023-03-28: 120 mg via INTRAVENOUS
  Filled 2023-03-28: qty 20

## 2023-03-28 MED ORDER — SODIUM CHLORIDE 0.9 % IV SOLN
1920.0000 mg/m2 | INTRAVENOUS | Status: DC
  Administered 2023-03-28: 3500 mg via INTRAVENOUS
  Filled 2023-03-28: qty 70

## 2023-03-28 MED ORDER — ATROPINE SULFATE 1 MG/ML IV SOLN
0.5000 mg | Freq: Once | INTRAVENOUS | Status: AC | PRN
  Administered 2023-03-28: 0.5 mg via INTRAVENOUS
  Filled 2023-03-28: qty 1

## 2023-03-28 MED ORDER — DEXAMETHASONE SODIUM PHOSPHATE 10 MG/ML IJ SOLN
10.0000 mg | Freq: Once | INTRAMUSCULAR | Status: AC
  Administered 2023-03-28: 10 mg via INTRAVENOUS
  Filled 2023-03-28: qty 1

## 2023-03-28 MED ORDER — SODIUM CHLORIDE 0.9 % IV SOLN
150.0000 mg | Freq: Once | INTRAVENOUS | Status: AC
  Administered 2023-03-28: 150 mg via INTRAVENOUS
  Filled 2023-03-28: qty 150

## 2023-03-28 MED ORDER — SODIUM CHLORIDE 0.9 % IV SOLN
120.0000 mg/m2 | Freq: Once | INTRAVENOUS | Status: AC
  Administered 2023-03-28: 200 mg via INTRAVENOUS
  Filled 2023-03-28: qty 10

## 2023-03-28 MED ORDER — PALONOSETRON HCL INJECTION 0.25 MG/5ML
0.2500 mg | Freq: Once | INTRAVENOUS | Status: AC
Start: 2023-03-28 — End: 2023-03-28
  Administered 2023-03-28: 0.25 mg via INTRAVENOUS
  Filled 2023-03-28: qty 5

## 2023-03-28 MED ORDER — DEXTROSE 5 % IV SOLN
INTRAVENOUS | Status: DC
Start: 2023-03-28 — End: 2023-03-28

## 2023-03-28 NOTE — Progress Notes (Signed)
 Hematology and Oncology Follow Up Visit  Alexander [name] [id number] [birth year] 83 y.o. 03/28/2023   Principle Diagnosis:  Adenocarcinoma of the pancreas-stage Ib (T2N0M0) --clinical stage --BRCA (-), KRAS (+), HRD(-)   Current Therapy:        Status post biliary stent placement FOLFIRINOX -- Neoadj - s/p cycle #8/12  - start on 10/31/2022   Interim History:  Alexander [name] is here today for follow-up and treatment. He continues to do quite nicely.  Very impressed with his resilience.  His last CA 19-9 was down to 57.  He is no longer jaundiced.  He has done incredibly well with the stents.  His appetite is okay.  He has absolutely horrible taste for food.  I am sure that this is secondary to his chemotherapy.  He is trying to stay active.  He is trying to walk every day.[name]  He is having no heart difficulty.  He is having no problems with bowels or bladder.  He has had no rashes.  There is been no bleeding.  He has had no leg swelling.  Overall, I would say his performance status is probably ECOG 1.    Wt Readings from Last 3 Encounters:  03/28/23 153 lb 1.9 oz (69.5 kg)  03/28/23 153 lb 1.9 oz (69.5 kg)  03/14/23 152 lb (68.9 kg)   Medications:  Allergies as of 03/28/2023       Reactions   Metformin And Related Diarrhea   Metformin Hcl Diarrhea        Medication List        Accurate as of March 28, 2023 11:58 AM. If you have any questions, ask your nurse or doctor.          Accu-Chek Softclix Lancets lancets Use to test blood glucose once daily   amLODipine 10 MG tablet Commonly known as: NORVASC Take 1 tablet (10 mg total) by mouth daily.   Droplet Pen Needles 31G X 6 MM Misc Generic drug: Insulin Pen Needle Use to check blood sugar TID   Embrace Pro Glucose Test test strip Generic drug: glucose blood Use as instructed   FreeStyle Libre 3 Plus Sensor Misc Change sensor every 15 days.   Gvoke HypoPen 1-Pack 1 MG/0.2ML Soaj Generic drug:  Glucagon Inject 1 mg into the skin as needed (low blood sugar with impaired consciousness).   insulin lispro 100 UNIT/ML KwikPen Commonly known as: HumaLOG KwikPen 2-3 units with meals 3 times a day plus sliding scale as instructed, maximum 20 units / day.   INSULIN SYRINGE .3CC/30GX5/16" 30G X 5/16" 0.3 ML Misc Use for insulin administration three times a day   Lantus SoloStar 100 UNIT/ML Solostar Pen Generic drug: insulin glargine INJECT 20 UNITS INTO THE SKIN 2 TIMES A DAY What changed: additional instructions   lipase/protease/amylase 40981 UNITS Cpep capsule Commonly known as: Creon Take 2 capsules (72,000 Units total) by mouth 3 (three) times daily with meals. May also take 1 capsule (36,000 Units total) as needed (with snacks - up to 4 snacks daily).   loperamide 2 MG capsule Commonly known as: IMODIUM Take 2 tabs by mouth with first loose stool, then 1 tab with each additional loose stool as needed. Do not exceed 8 tabs in a 24-hour period   NovoLOG FlexPen 100 UNIT/ML FlexPen Generic drug: insulin aspart 2-3 units with meals 3 times a day plus sliding scale as instructed, maximum 20units/day What changed: additional instructions   OLANZapine 5 MG tablet Commonly known as: ZYPREXA Take  1 tablet (5 mg total) by mouth at bedtime.   ondansetron 8 MG tablet Commonly known as: Zofran Take 1 tablet (8 mg total) by mouth every 8 (eight) hours as needed for nausea or vomiting. Start on the third day after chemotherapy   pantoprazole 40 MG tablet Commonly known as: PROTONIX Take 1 tablet (40 mg total) by mouth 2 (two) times daily before a meal.   prochlorperazine 10 MG tablet Commonly known as: COMPAZINE Take 1 tablet (10 mg total) by mouth every 6 (six) hours as needed for nausea or vomiting (Nausea or vomiting).   saccharomyces boulardii 250 MG capsule Commonly known as: FLORASTOR Take 250 mg by mouth 2 (two) times daily.   VITAMIN D (CHOLECALCIFEROL) PO Take by  mouth 2 (two) times daily.        Allergies:  Allergies  Allergen Reactions   Metformin And Related Diarrhea   Metformin Hcl Diarrhea    Past Medical History, Surgical history, Social history, and Family History were reviewed and updated.  Review of Systems: Review of Systems  Constitutional: Negative.   HENT: Negative.    Eyes: Negative.   Respiratory: Negative.    Cardiovascular: Negative.   Gastrointestinal: Negative.   Genitourinary: Negative.   Musculoskeletal: Negative.   Skin: Negative.   Neurological: Negative.   Endo/Heme/Allergies: Negative.   Psychiatric/Behavioral: Negative.       Physical Exam:  height is 5\' 11"  (1.803 m) and weight is 153 lb 1.9 oz (69.5 kg).   Wt Readings from Last 3 Encounters:  03/28/23 153 lb 1.9 oz (69.5 kg)  03/28/23 153 lb 1.9 oz (69.5 kg)  03/14/23 152 lb (68.9 kg)    Physical Exam Vitals reviewed.  HENT:     Head: Normocephalic and atraumatic.  Eyes:     Pupils: Pupils are equal, round, and reactive to light.  Cardiovascular:     Rate and Rhythm: Normal rate and regular rhythm.     Heart sounds: Normal heart sounds.  Pulmonary:     Effort: Pulmonary effort is normal.     Breath sounds: Normal breath sounds.  Abdominal:     General: Bowel sounds are normal.     Palpations: Abdomen is soft.  Musculoskeletal:        General: No tenderness or deformity. Normal range of motion.     Cervical back: Normal range of motion.  Lymphadenopathy:     Cervical: No cervical adenopathy.  Skin:    General: Skin is warm and dry.     Findings: No erythema or rash.  Neurological:     Mental Status: He is alert and oriented to person, place, and time.  Psychiatric:        Behavior: Behavior normal.        Thought Content: Thought content normal.        Judgment: Judgment normal.     Lab Results  Component Value Date   WBC 6.4 03/28/2023   HGB 11.6 (L) 03/28/2023   HCT 34.8 (L) 03/28/2023   MCV 100.9 (H) 03/28/2023   PLT  131 (L) 03/28/2023   Lab Results  Component Value Date   FERRITIN 240 08/30/2022   IRON 103 08/30/2022   TIBC 305 08/30/2022   UIBC 202 08/30/2022   IRONPCTSAT 34 08/30/2022   Lab Results  Component Value Date   RETICCTPCT 0.7 08/30/2022   RBC 3.45 (L) 03/28/2023   RETICCTABS 32.6 12/19/2013   No results found for: "KPAFRELGTCHN", "LAMBDASER", "KAPLAMBRATIO" No results found for: "IGGSERUM", "  IGA", "IGMSERUM" No results found for: "TOTALPROTELP", "ALBUMINELP", "A1GS", "A2GS", "BETS", "BETA2SER", "GAMS", "MSPIKE", "SPEI"   Chemistry      Component Value Date/Time   NA 138 03/28/2023 1025   NA 144 11/29/2016 1054   NA 139 12/29/2015 0954   K 4.2 03/28/2023 1025   K 4.2 11/29/2016 1054   K 4.1 12/29/2015 0954   CL 105 03/28/2023 1025   CL 102 11/29/2016 1054   CO2 28 03/28/2023 1025   CO2 30 11/29/2016 1054   CO2 26 12/29/2015 0954   BUN 15 03/28/2023 1025   BUN 22 11/29/2016 1054   BUN 20.0 12/29/2015 0954   CREATININE 1.08 03/28/2023 1025   CREATININE 1.2 11/29/2016 1054   CREATININE 1.3 12/29/2015 0954      Component Value Date/Time   CALCIUM 8.6 (L) 03/28/2023 1025   CALCIUM 9.3 11/29/2016 1054   CALCIUM 9.5 12/29/2015 0954   ALKPHOS 97 03/28/2023 1025   ALKPHOS 92 (H) 11/29/2016 1054   ALKPHOS 98 12/29/2015 0954   AST 14 (L) 03/28/2023 1025   AST 16 12/29/2015 0954   ALT 13 03/28/2023 1025   ALT 20 11/29/2016 1054   ALT 13 12/29/2015 0954   BILITOT 0.5 03/28/2023 1025   BILITOT 0.71 12/29/2015 0954     Encounter Diagnoses  Name Primary?   Malignant neoplasm of tail of pancreas (HCC) Yes   Loss of weight    Personal history of urinary calculi    Unstable angina (HCC)    TMJ syndrome    Diverticulosis of colon    Genetic testing    Elevated liver function tests    Angina at rest [facility])    Gastroesophageal reflux disease without esophagitis    Microscopic hematuria    Prediabetes    History of colonic polyps    Obstructive jaundice    Essential  hypertension    Polycythemia vera (HCC)    Coronary artery disease due to lipid rich plaque    Other hemorrhoids    Idiopathic gout, unspecified chronicity, unspecified site    Chronic bilateral low back pain without sciatica    Hyperlipidemia, unspecified hyperlipidemia type    Mass of head of pancreas     Impression and Plan: Alexander [name] is a pleasant 83 yo gentleman with original diagnosis of  polycythemia vera and now early stage pancreatic cancer.   Again, I am incredibly impressed with his resilience.  He has such a tough guy.  He is definitely responding.  Hopefully, he will continue to respond.  Will go ahead with his ninth cycle of treatment.  We will keep him on schedule him come back in 2 weeks.  He will get a total of 12 cycles.   Josph Macho, MD 3/26/202511:58 AM

## 2023-03-28 NOTE — Patient Instructions (Signed)

## 2023-03-28 NOTE — Progress Notes (Signed)
 Patient is doing well today. He says he worked out too hard the last two days and today he is more tired than usual. He also c/o of lack of appetite. He will proceed with cycle nine today. Plan continues to be twelve cycles and then consideration for surgery.   Oncology Nurse Navigator Documentation     03/28/2023   10:30 AM  Oncology Nurse Navigator Flowsheets  Navigator Follow Up Date: 04/11/2023  Navigator Follow Up Reason: Follow-up Appointment;Chemotherapy  Navigator Location [facility]  Navigator Encounter Type Treatment  Patient Visit Type MedOnc  Treatment Phase Active Tx  Barriers/Navigation Needs No Barriers At This Time  Interventions Psycho-Social Support  Acuity Level 1-No Barriers  Support Groups/Services Friends and Family  Time Spent with Patient 15

## 2023-03-29 ENCOUNTER — Encounter: Payer: Self-pay | Admitting: *Deleted

## 2023-03-29 LAB — CANCER ANTIGEN 19-9: CA 19-9: 41 U/mL — ABNORMAL HIGH (ref 0–35)

## 2023-03-30 ENCOUNTER — Inpatient Hospital Stay: Payer: Medicare HMO

## 2023-03-30 VITALS — BP 134/72 | HR 56 | Resp 16

## 2023-03-30 DIAGNOSIS — C252 Malignant neoplasm of tail of pancreas: Secondary | ICD-10-CM

## 2023-03-30 DIAGNOSIS — Z5111 Encounter for antineoplastic chemotherapy: Secondary | ICD-10-CM | POA: Diagnosis not present

## 2023-03-30 MED ORDER — SODIUM CHLORIDE 0.9% FLUSH
10.0000 mL | INTRAVENOUS | Status: DC | PRN
  Administered 2023-03-30: 10 mL

## 2023-03-30 MED ORDER — HEPARIN SOD (PORK) LOCK FLUSH 100 UNIT/ML IV SOLN
500.0000 [IU] | Freq: Once | INTRAVENOUS | Status: AC | PRN
  Administered 2023-03-30: 500 [IU]

## 2023-03-30 NOTE — Patient Instructions (Signed)
 [facility] - A DEPT OF [facility]  Discharge Instructions: Thank you for choosing [facility] to provide your oncology and hematology care.   If you have a lab appointment with the Cancer Center, please go directly to the Cancer Center and check in at the registration area.  Wear comfortable clothing and clothing appropriate for easy access to any Portacath or PICC line.   We strive to give you quality time with your provider. You may need to reschedule your appointment if you arrive late (15 or more minutes).  Arriving late affects you and other patients whose appointments are after yours.  Also, if you miss three or more appointments without notifying the office, you may be dismissed from the clinic at the provider's discretion.      For prescription refill requests, have your pharmacy contact our office and allow 72 hours for refills to be completed.    Today you received the following chemotherapy and/or immunotherapy agents 5FU      To help prevent nausea and vomiting after your treatment, we encourage you to take your nausea medication as directed.  BELOW ARE SYMPTOMS THAT SHOULD BE REPORTED IMMEDIATELY: *FEVER GREATER THAN 100.4 F (38 C) OR HIGHER *CHILLS OR SWEATING *NAUSEA AND VOMITING THAT IS NOT CONTROLLED WITH YOUR NAUSEA MEDICATION *UNUSUAL SHORTNESS OF BREATH *UNUSUAL BRUISING OR BLEEDING *URINARY PROBLEMS (pain or burning when urinating, or frequent urination) *BOWEL PROBLEMS (unusual diarrhea, constipation, pain near the anus) TENDERNESS IN MOUTH AND THROAT WITH OR WITHOUT PRESENCE OF ULCERS (sore throat, sores in mouth, or a toothache) UNUSUAL RASH, SWELLING OR PAIN  UNUSUAL VAGINAL DISCHARGE OR ITCHING   Items with * indicate a potential emergency and should be followed up as soon as possible or go to the Emergency Department if any problems should occur.  Please show the CHEMOTHERAPY ALERT CARD or IMMUNOTHERAPY ALERT  CARD at check-in to the Emergency Department and triage nurse. Should you have questions after your visit or need to cancel or reschedule your appointment, please contact [facility] - A DEPT OF [facility]  [phone] and follow the prompts.  Office hours are 8:00 a.m. to 4:30 p.m. Monday - Friday. Please note that voicemails left after 4:00 p.m. may not be returned until the following business day.  We are closed weekends and major holidays. You have access to a nurse at all times for urgent questions. Please call the main number to the clinic [phone] and follow the prompts.  For any non-urgent questions, you may also contact your provider using MyChart. We now offer e-Visits for anyone 25 and older to request care online for non-urgent symptoms. For details visit [email or web address].   Also download the MyChart app! Go to the app store, search "MyChart", open the app, select [place], and log in with your MyChart username and password.

## 2023-04-04 ENCOUNTER — Inpatient Hospital Stay: Attending: Hematology & Oncology | Admitting: Dietician

## 2023-04-04 ENCOUNTER — Telehealth: Payer: Self-pay

## 2023-04-04 ENCOUNTER — Encounter: Payer: Self-pay | Admitting: Family

## 2023-04-04 ENCOUNTER — Other Ambulatory Visit: Payer: Self-pay

## 2023-04-04 DIAGNOSIS — C252 Malignant neoplasm of tail of pancreas: Secondary | ICD-10-CM

## 2023-04-04 DIAGNOSIS — Z5111 Encounter for antineoplastic chemotherapy: Secondary | ICD-10-CM | POA: Insufficient documentation

## 2023-04-04 DIAGNOSIS — C259 Malignant neoplasm of pancreas, unspecified: Secondary | ICD-10-CM | POA: Insufficient documentation

## 2023-04-04 NOTE — Telephone Encounter (Signed)
 Pt sent MyChart message about not being able to eat even though being hungry.  This RN reached out to Doyce Para RD to evaluate patient.  Doyce Para reached out to this office to state patient may need IVF as patient has lost 5 pounds since last week and unable to drink or eat. Dr. Myna Hidalgo aware and wanted patient to come in for IVF tomorrow. This RN spoke to patient who stated he would like to get IVF. Pt states he has not taken any anti nausea medications. Pt encouraged to take an anti nausea medications before eating to see if that helps any of his symptoms. Anti nausea medications reviewed. Pt verbalized understanding. Pt educated on appointment time and day and understanding verbalized.

## 2023-04-04 NOTE — Progress Notes (Signed)
 Nutrition Follow Up: Called patient in response to request from RN who reported complaining of nausea and unable to eat along with weight loss.   Patient states since his last treatment his dygeusia has gotten increasingly worse and he can't tolerate any foods or drinks without everything tasting horrible.  He said he has tried mouth rinses, none work for him. He said he bought the [facility] but that didn't help.  He's has experimented with lots of flavors and tried everything from coffee, orange juice, only tolerate room temperature foods and liquids. Hasn't tried plain almond milk yet but he's added a protein drink powder to it and has gotten that down. He gets no relief from using a straw to suck things down faster and reduce contact with mouth. He also got down a bowl of oatmeal yesterday with brown sugar tried a BLT but bacon tasted horrible. Hasn't tried avocado yet but willing to explore that.  [place] and hasn't found a humus he likes here yet so that wasn't an option. He and spouse were driving and he couldn't remember what the protein powder was that he could force feed. He also was trying to get some diluted cranberry juice down. That seemed to be a bit less repulsive than plain water.  He states he lost 5 pounds since his treatment last week.   He doesn't have any appointments tomorrow and could be available for an evaluation for IVF.  He also relayed that he wouldn't be interested in a temporary NG tube for nourishment.  I asked him to text me a picture of his protein powder label and encouraged continued trial and error to find what he can get down.  Gennaro Africa, RDN, LDN Registered Dietitian, [facility] Part Time Remote (Usual office hours: Tuesday-Thursday) Mobile: [phone] Remote Office: [phone]

## 2023-04-05 ENCOUNTER — Inpatient Hospital Stay: Admitting: Dietician

## 2023-04-05 ENCOUNTER — Inpatient Hospital Stay

## 2023-04-05 VITALS — BP 140/76 | HR 60 | Temp 97.2°F | Resp 18

## 2023-04-05 DIAGNOSIS — C252 Malignant neoplasm of tail of pancreas: Secondary | ICD-10-CM

## 2023-04-05 DIAGNOSIS — Z5111 Encounter for antineoplastic chemotherapy: Secondary | ICD-10-CM | POA: Diagnosis present

## 2023-04-05 DIAGNOSIS — C259 Malignant neoplasm of pancreas, unspecified: Secondary | ICD-10-CM | POA: Diagnosis present

## 2023-04-05 MED ORDER — SODIUM CHLORIDE 0.9 % IV SOLN: INTRAVENOUS | Status: DC

## 2023-04-05 NOTE — Patient Instructions (Signed)
 Dehydration, Adult Dehydration is a condition in which there is not enough water or other fluids in the body. This happens when a person loses more fluids than they take in. Important organs cannot work right without the right amount of fluids. Any loss of fluids from the body can cause dehydration. Dehydration can be mild, worse, or very bad. It should be treated right away to keep it from getting very bad. What are the causes? Conditions that cause loss of water in the body. They include: Watery poop (diarrhea). Vomiting. Sweating a lot. Fever. Infection. Peeing (urinating) a lot. Not drinking enough fluids. Certain medicines, such as medicines that take extra fluid out of the body (diuretics). Lack of safe drinking water. Not being able to get enough water and food. What increases the risk? Having a long-term (chronic) illness that has not been treated the right way, such as: Diabetes. Heart disease. Kidney disease. Being 25 years of age or older. Having a disability. Living in a place that is high above the ground or sea (high in altitude). The thinner, drier air causes more fluid loss. Doing exercises that put stress on your body for a long time. Being active when in hot places. What are the signs or symptoms? Symptoms of dehydration depend on how bad it is. Mild or worse dehydration Thirst. Dry lips or dry mouth. Feeling dizzy or light-headed. Muscle cramps. Passing little pee or dark pee. Pee may be the color of tea. Headache. Very bad dehydration Changes in skin. Skin may: Be cold to the touch (clammy). Be blotchy or pale. Not go back to normal right after you pinch it and let it go. Little or no tears, pee, or sweat. Fast breathing. Low blood pressure. Weak pulse. Pulse that is more than 100 beats a minute when you are sitting still. Other changes, such as: Feeling very thirsty. Eyes that look hollow (sunken). Cold hands and feet. Being confused. Being very  tired (lethargic) or having trouble waking from sleep. Losing weight. Loss of consciousness. How is this treated? Treatment for this condition depends on how bad your dehydration is. Treatment should start right away. Do not wait until your condition gets very bad. Very bad dehydration is an emergency. You will need to go to a hospital. Mild or worse dehydration can be treated at home. You may be asked to: Drink more fluids. Drink an oral rehydration solution (ORS). This drink gives you the right amount of fluids, salts, and minerals (electrolytes). Very bad dehydration can be treated: With fluids through an IV tube. By correcting low levels of electrolytes in the body. By treating the problem that caused your dehydration. Follow these instructions at home: Oral rehydration solution If told by your doctor, drink an ORS: Make an ORS. Use instructions on the package. Start by drinking small amounts, about  cup (120 mL) every 5-10 minutes. Slowly drink more until you have had the amount that your doctor said to have.  Eating and drinking  Drink enough clear fluid to keep your pee pale yellow. If you were told to drink an ORS, finish the ORS first. Then, start slowly drinking other clear fluids. Drink fluids such as: Water. Do not drink only water. Doing that can make the salt (sodium) level in your body get too low. Water from ice chips you suck on. Fruit juice that you have added water to (diluted). Low-calorie sports drinks. Eat foods that have the right amounts of salts and minerals, such as bananas, oranges, potatoes,  tomatoes, or spinach. Do not drink alcohol. Avoid drinks that have caffeine or sugar. These include:: High-calorie sports drinks. Fruit juice that you did not add water to. Soda. Coffee or energy drinks. Avoid foods that are greasy or have a lot of fat or sugar. General instructions Take over-the-counter and prescription medicines only as told by your doctor. Do  not take sodium tablets. Doing that can make the salt level in your body get too high. Return to your normal activities as told by your doctor. Ask your doctor what activities are safe for you. Keep all follow-up visits. Your doctor may check and change your treatment. Contact a doctor if: You have pain in your belly (abdomen) and the pain: Gets worse. Stays in one place. You have a rash. You have a stiff neck. You get angry or annoyed more easily than normal. You are more tired or have a harder time waking than normal. You feel weak or dizzy. You feel very thirsty. Get help right away if: You have any symptoms of very bad dehydration. You vomit every time you eat or drink. Your vomiting gets worse, does not go away, or you vomit blood or green stuff. You are getting treatment, but symptoms are getting worse. You have a fever. You have a very bad headache. You have: Diarrhea that gets worse or does not go away. Blood in your poop (stool). This may cause poop to look black and tarry. No pee in 6-8 hours. Only a small amount of pee in 6-8 hours, and the pee is very dark. You have trouble breathing. These symptoms may be an emergency. Get help right away. Call 911. Do not wait to see if the symptoms will go away. Do not drive yourself to the hospital. This information is not intended to replace advice given to you by your health care provider. Make sure you discuss any questions you have with your health care provider. Document Revised: 07/18/2021 Document Reviewed: 07/18/2021 Elsevier Patient Education  2024 [employer].

## 2023-04-05 NOTE — Progress Notes (Signed)
 Pt refused pepcid Iv at this time. Pt stated it did not "work For [employer] Pt  was able to drink a can of warm soup with nausea or  vomitting. Pt stated that he was able to eat starting  yesterday.  Was able to consume breakfast  with no nausea.  Pt encouraged to take anti nausea medications before during, and after chemo sessions and as needed to  keep nausea under control.  Pt stated that he didn't think he needed the medications. Once again the patient was encouraged  to follow  prescribed anti nausea medication. Pt instructed to all our center for issues. Pt verbalized understanding of directions.

## 2023-04-05 NOTE — Progress Notes (Signed)
 Offered pt nausea medicine while in clinic and he denied and stated it 'doesnt work and he is not nausea."

## 2023-04-05 NOTE — Progress Notes (Signed)
 Nutrition Follow-up:  Called patient during infusion.  He texted me a picture of the protein supplement he was using. Evolution Chocolate (whey protein isolate) 1 scoop 167 calories, 30 g protein, Vits added ( A, B1, B2, B5, B6, B9, C, D3, E. No minerals or Zinc.  Sweetened with Stevia. Patient reports he can probably get one down a day.  He ate well last night has large bowl of split pea soup, grilled cheese sandwich. Likes to snack on 12 grain bread, will do [place] yogurt with add ins, Enjoyed Reese's peanut butter cup.  Has almond milk and lactaid in home. Loves chick peas.   Medications: No supplement other than Vit D  Labs: 03/28/23 Glucose 270, Hgb 11.6  Anthropometrics:   Height: 71' Weight: 153# UBW: 170# BMI: 21.36   Estimated Energy Needs   Kcals: 2135-2400 Protein: 73-92g Fluid: 2.4  NUTRITION DIAGNOSIS: Inadequate PO intake overall r/t dysgeusia to meet increased nutrient needs. Improving with increased weight and patient force feeding himself. Continues  INTERVENTION:  Encouraged continued exploration of condiments to take taste out of mouth (doesn't tolerate ketchup) ([place], salad dressings, soy etc.) Consider chewing chick peas after other foods to remove foul taste Encouraged use of legumes and soups to hydrate. Encouraged trial of plain almond milk or lactaid to hydrate, or use of diluted cranberry whichever proves less offensive. Trial of pudding or sherbets.  Will send recipe for high protein chocolate peanut butter pudding. (Sub lactose free milk and add in protein powder)    MONITORING, EVALUATION, GOAL: weight trends, nutrition impact symptoms, PO intake, labs     Next Visit: remote next week   Gennaro Africa, RDN, LDN Registered Dietitian,  [facility] Part Time Remote (Usual office hours: Tuesday-Thursday) Mobile: [phone]

## 2023-04-10 ENCOUNTER — Encounter: Payer: Self-pay | Admitting: Hematology & Oncology

## 2023-04-10 ENCOUNTER — Inpatient Hospital Stay

## 2023-04-10 ENCOUNTER — Encounter: Payer: Self-pay | Admitting: *Deleted

## 2023-04-10 ENCOUNTER — Other Ambulatory Visit: Payer: Self-pay

## 2023-04-10 ENCOUNTER — Inpatient Hospital Stay: Admitting: Hematology & Oncology

## 2023-04-10 VITALS — BP 116/70 | HR 58 | Temp 97.5°F | Resp 18 | Ht 71.0 in | Wt 151.0 lb

## 2023-04-10 DIAGNOSIS — K219 Gastro-esophageal reflux disease without esophagitis: Secondary | ICD-10-CM

## 2023-04-10 DIAGNOSIS — R634 Abnormal weight loss: Secondary | ICD-10-CM | POA: Diagnosis not present

## 2023-04-10 DIAGNOSIS — K8689 Other specified diseases of pancreas: Secondary | ICD-10-CM

## 2023-04-10 DIAGNOSIS — M1000 Idiopathic gout, unspecified site: Secondary | ICD-10-CM

## 2023-04-10 DIAGNOSIS — I200 Unstable angina: Secondary | ICD-10-CM

## 2023-04-10 DIAGNOSIS — I10 Essential (primary) hypertension: Secondary | ICD-10-CM

## 2023-04-10 DIAGNOSIS — Z87442 Personal history of urinary calculi: Secondary | ICD-10-CM | POA: Diagnosis not present

## 2023-04-10 DIAGNOSIS — C252 Malignant neoplasm of tail of pancreas: Secondary | ICD-10-CM | POA: Diagnosis not present

## 2023-04-10 DIAGNOSIS — D45 Polycythemia vera: Secondary | ICD-10-CM

## 2023-04-10 DIAGNOSIS — I2089 Other forms of angina pectoris: Secondary | ICD-10-CM

## 2023-04-10 DIAGNOSIS — R7303 Prediabetes: Secondary | ICD-10-CM

## 2023-04-10 DIAGNOSIS — M26629 Arthralgia of temporomandibular joint, unspecified side: Secondary | ICD-10-CM

## 2023-04-10 DIAGNOSIS — I2510 Atherosclerotic heart disease of native coronary artery without angina pectoris: Secondary | ICD-10-CM

## 2023-04-10 DIAGNOSIS — K5730 Diverticulosis of large intestine without perforation or abscess without bleeding: Secondary | ICD-10-CM

## 2023-04-10 DIAGNOSIS — K648 Other hemorrhoids: Secondary | ICD-10-CM

## 2023-04-10 DIAGNOSIS — E785 Hyperlipidemia, unspecified: Secondary | ICD-10-CM

## 2023-04-10 DIAGNOSIS — Z860100 Personal history of colon polyps, unspecified: Secondary | ICD-10-CM

## 2023-04-10 DIAGNOSIS — K831 Obstruction of bile duct: Secondary | ICD-10-CM

## 2023-04-10 DIAGNOSIS — R7989 Other specified abnormal findings of blood chemistry: Secondary | ICD-10-CM

## 2023-04-10 DIAGNOSIS — Z1379 Encounter for other screening for genetic and chromosomal anomalies: Secondary | ICD-10-CM

## 2023-04-10 DIAGNOSIS — M545 Low back pain, unspecified: Secondary | ICD-10-CM

## 2023-04-10 DIAGNOSIS — I2583 Coronary atherosclerosis due to lipid rich plaque: Secondary | ICD-10-CM

## 2023-04-10 DIAGNOSIS — G8929 Other chronic pain: Secondary | ICD-10-CM

## 2023-04-10 DIAGNOSIS — R3129 Other microscopic hematuria: Secondary | ICD-10-CM

## 2023-04-10 DIAGNOSIS — Z5111 Encounter for antineoplastic chemotherapy: Secondary | ICD-10-CM | POA: Diagnosis not present

## 2023-04-10 LAB — CMP (CANCER CENTER ONLY)
ALT: 15 U/L (ref 0–44)
AST: 14 U/L — ABNORMAL LOW (ref 15–41)
Albumin: 3.5 g/dL (ref 3.5–5.0)
Alkaline Phosphatase: 94 U/L (ref 38–126)
Anion gap: 8 (ref 5–15)
BUN: 16 mg/dL (ref 8–23)
CO2: 27 mmol/L (ref 22–32)
Calcium: 9 mg/dL (ref 8.9–10.3)
Chloride: 105 mmol/L (ref 98–111)
Creatinine: 1.14 mg/dL (ref 0.61–1.24)
GFR, Estimated: >60 mL/min (ref >60)
Glucose, Bld: 171 mg/dL — ABNORMAL HIGH (ref 70–99)
Potassium: 3.9 mmol/L (ref 3.5–5.1)
Sodium: 140 mmol/L (ref 135–145)
Total Bilirubin: 0.5 mg/dL (ref 0.0–1.2)
Total Protein: 6.1 g/dL — ABNORMAL LOW (ref 6.5–8.1)

## 2023-04-10 LAB — CBC WITH DIFFERENTIAL (CANCER CENTER ONLY)
Abs Immature Granulocytes: 0.03 10*3/uL (ref 0.00–0.07)
Basophils Absolute: 0 10*3/uL (ref 0.0–0.1)
Basophils Relative: 1 %
Eosinophils Absolute: 0.1 10*3/uL (ref 0.0–0.5)
Eosinophils Relative: 3 %
HCT: 32.5 % — ABNORMAL LOW (ref 39.0–52.0)
Hemoglobin: 10.8 g/dL — ABNORMAL LOW (ref 13.0–17.0)
Immature Granulocytes: 1 %
Lymphocytes Relative: 32 %
Lymphs Abs: 1.4 10*3/uL (ref 0.7–4.0)
MCH: 32.9 pg (ref 26.0–34.0)
MCHC: 33.2 g/dL (ref 30.0–36.0)
MCV: 99.1 fL (ref 80.0–100.0)
Monocytes Absolute: 0.7 10*3/uL (ref 0.1–1.0)
Monocytes Relative: 17 %
Neutro Abs: 2 10*3/uL (ref 1.7–7.7)
Neutrophils Relative %: 46 %
Platelet Count: 152 10*3/uL (ref 150–400)
RBC: 3.28 MIL/uL — ABNORMAL LOW (ref 4.22–5.81)
RDW: 13.6 % (ref 11.5–15.5)
WBC Count: 4.2 10*3/uL (ref 4.0–10.5)
nRBC: 0 % (ref 0.0–0.2)

## 2023-04-10 MED ORDER — OXALIPLATIN CHEMO INJECTION 100 MG/20ML
61.2000 mg/m2 | Freq: Once | INTRAVENOUS | Status: AC
  Administered 2023-04-10: 100 mg via INTRAVENOUS
  Filled 2023-04-10: qty 20

## 2023-04-10 MED ORDER — SODIUM CHLORIDE 0.9 % IV SOLN
150.0000 mg | Freq: Once | INTRAVENOUS | Status: AC
  Administered 2023-04-10: 150 mg via INTRAVENOUS
  Filled 2023-04-10: qty 150

## 2023-04-10 MED ORDER — LEUCOVORIN CALCIUM INJECTION 350 MG
400.0000 mg/m2 | Freq: Once | INTRAMUSCULAR | Status: AC
  Administered 2023-04-10: 692 mg via INTRAVENOUS
  Filled 2023-04-10: qty 34.6

## 2023-04-10 MED ORDER — PALONOSETRON HCL INJECTION 0.25 MG/5ML
0.2500 mg | Freq: Once | INTRAVENOUS | Status: AC
  Administered 2023-04-10: 0.25 mg via INTRAVENOUS
  Filled 2023-04-10: qty 5

## 2023-04-10 MED ORDER — IRINOTECAN HCL CHEMO INJECTION 100 MG/5ML
100.0000 mg/m2 | Freq: Once | INTRAVENOUS | Status: AC
  Administered 2023-04-10: 180 mg via INTRAVENOUS
  Filled 2023-04-10: qty 5

## 2023-04-10 MED ORDER — ATROPINE SULFATE 1 MG/ML IV SOLN
0.5000 mg | Freq: Once | INTRAVENOUS | Status: AC | PRN
  Administered 2023-04-10: 0.5 mg via INTRAVENOUS
  Filled 2023-04-10: qty 1

## 2023-04-10 MED ORDER — SODIUM CHLORIDE 0.9 % IV SOLN
1728.0000 mg/m2 | INTRAVENOUS | Status: DC
  Administered 2023-04-10: 3000 mg via INTRAVENOUS
  Filled 2023-04-10: qty 60

## 2023-04-10 MED ORDER — DEXTROSE 5 % IV SOLN: INTRAVENOUS | Status: DC

## 2023-04-10 MED ORDER — DEXAMETHASONE SODIUM PHOSPHATE 10 MG/ML IJ SOLN
10.0000 mg | Freq: Once | INTRAMUSCULAR | Status: AC
  Administered 2023-04-10: 10 mg via INTRAVENOUS
  Filled 2023-04-10: qty 1

## 2023-04-10 NOTE — Progress Notes (Signed)
 Reduce Irinotecan dose by 10% to 180mg  per Dr. Myna Hidalgo.  Anola Gurney Artas, [place], BCPS, BCOP 04/10/2023 8:59 AM

## 2023-04-10 NOTE — Progress Notes (Signed)
 Hematology and Oncology Follow Up Visit  JAKOREY MCCONATHY [id number] 25-Apr-1940 83 y.o. 04/10/2023   Principle Diagnosis:  Adenocarcinoma of the pancreas-stage Ib (T2N0M0) --clinical stage --BRCA (-), KRAS (+), HRD(-)   Current Therapy:        Status post biliary stent placement FOLFIRINOX -- Neoadj - s/p cycle #9/12  - start on 10/31/2022   Interim History:  Mr. Mousel is here today for follow-up and treatment.  He started to feel some of the effects of the chemotherapy.  I think this is a cumulative issue.  I am going to go ahead and decrease the dose a little bit.  I think he would tolerate this a little bit better.  He is eating okay.  His weight is down a couple pounds.  Marina Goodell has had no diarrhea.  There is been no cough or shortness of breath.  He has had no bleeding.  There is been no rashes.  He has had no leg swelling.  His last CA 19-9 was down to 41.  This has come down quite nicely since we started treatment.  He has had no mouth sores..  He does have occasional abdominal discomfort in the epigastric region.  He has had no fever.  He has had no headache.  Overall, I would say that his performance status is ECOG 1.    Wt Readings from Last 3 Encounters:  04/10/23 151 lb (68.5 kg)  03/28/23 153 lb 1.9 oz (69.5 kg)  03/28/23 153 lb 1.9 oz (69.5 kg)   Medications:  Allergies as of 04/10/2023       Reactions   Metformin And Related Diarrhea   Metformin Hcl Diarrhea        Medication List        Accurate as of April 10, 2023  8:21 AM. If you have any questions, ask your nurse or doctor.          Accu-Chek Softclix Lancets lancets Use to test blood glucose once daily   amLODipine 10 MG tablet Commonly known as: NORVASC Take 1 tablet (10 mg total) by mouth daily.   Droplet Pen Needles 31G X 6 MM Misc Generic drug: Insulin Pen Needle Use to check blood sugar TID   Embrace Pro Glucose Test test strip Generic drug: glucose blood Use as instructed    FreeStyle Libre 3 Plus Sensor Misc Change sensor every 15 days.   Gvoke HypoPen 1-Pack 1 MG/0.2ML Soaj Generic drug: Glucagon Inject 1 mg into the skin as needed (low blood sugar with impaired consciousness).   insulin lispro 100 UNIT/ML KwikPen Commonly known as: HumaLOG KwikPen 2-3 units with meals 3 times a day plus sliding scale as instructed, maximum 20 units / day.   INSULIN SYRINGE .3CC/30GX5/16" 30G X 5/16" 0.3 ML Misc Use for insulin administration three times a day   Lantus SoloStar 100 UNIT/ML Solostar Pen Generic drug: insulin glargine INJECT 20 UNITS INTO THE SKIN 2 TIMES A DAY What changed: additional instructions   lipase/protease/amylase 47829 UNITS Cpep capsule Commonly known as: Creon Take 2 capsules (72,000 Units total) by mouth 3 (three) times daily with meals. May also take 1 capsule (36,000 Units total) as needed (with snacks - up to 4 snacks daily).   loperamide 2 MG capsule Commonly known as: IMODIUM Take 2 tabs by mouth with first loose stool, then 1 tab with each additional loose stool as needed. Do not exceed 8 tabs in a 24-hour period   NovoLOG FlexPen 100 UNIT/ML FlexPen Generic  drug: insulin aspart 2-3 units with meals 3 times a day plus sliding scale as instructed, maximum 20units/day What changed: additional instructions   OLANZapine 5 MG tablet Commonly known as: ZYPREXA Take 1 tablet (5 mg total) by mouth at bedtime.   ondansetron 8 MG tablet Commonly known as: Zofran Take 1 tablet (8 mg total) by mouth every 8 (eight) hours as needed for nausea or vomiting. Start on the third day after chemotherapy   pantoprazole 40 MG tablet Commonly known as: PROTONIX Take 1 tablet (40 mg total) by mouth 2 (two) times daily before a meal.   prochlorperazine 10 MG tablet Commonly known as: COMPAZINE Take 1 tablet (10 mg total) by mouth every 6 (six) hours as needed for nausea or vomiting (Nausea or vomiting).   saccharomyces boulardii 250 MG  capsule Commonly known as: FLORASTOR Take 250 mg by mouth 2 (two) times daily.   VITAMIN D (CHOLECALCIFEROL) PO Take by mouth 2 (two) times daily.        Allergies:  Allergies  Allergen Reactions   Metformin And Related Diarrhea   Metformin Hcl Diarrhea    Past Medical History, Surgical history, Social history, and Family History were reviewed and updated.  Review of Systems: Review of Systems  Constitutional: Negative.   HENT: Negative.    Eyes: Negative.   Respiratory: Negative.    Cardiovascular: Negative.   Gastrointestinal: Negative.   Genitourinary: Negative.   Musculoskeletal: Negative.   Skin: Negative.   Neurological: Negative.   Endo/Heme/Allergies: Negative.   Psychiatric/Behavioral: Negative.       Physical Exam:  height is 5\' 11"  (1.803 m) and weight is 151 lb (68.5 kg). His oral temperature is 97.5 F (36.4 C) (abnormal). His blood pressure is 116/70 and his pulse is 58 (abnormal). His respiration is 18 and oxygen saturation is 100%.   Wt Readings from Last 3 Encounters:  04/10/23 151 lb (68.5 kg)  03/28/23 153 lb 1.9 oz (69.5 kg)  03/28/23 153 lb 1.9 oz (69.5 kg)    Physical Exam Vitals reviewed.  HENT:     Head: Normocephalic and atraumatic.  Eyes:     Pupils: Pupils are equal, round, and reactive to light.  Cardiovascular:     Rate and Rhythm: Normal rate and regular rhythm.     Heart sounds: Normal heart sounds.  Pulmonary:     Effort: Pulmonary effort is normal.     Breath sounds: Normal breath sounds.  Abdominal:     General: Bowel sounds are normal.     Palpations: Abdomen is soft.  Musculoskeletal:        General: No tenderness or deformity. Normal range of motion.     Cervical back: Normal range of motion.  Lymphadenopathy:     Cervical: No cervical adenopathy.  Skin:    General: Skin is warm and dry.     Findings: No erythema or rash.  Neurological:     Mental Status: He is alert and oriented to person, place, and time.   Psychiatric:        Behavior: Behavior normal.        Thought Content: Thought content normal.        Judgment: Judgment normal.     Lab Results  Component Value Date   WBC 4.2 04/10/2023   HGB 10.8 (L) 04/10/2023   HCT 32.5 (L) 04/10/2023   MCV 99.1 04/10/2023   PLT 152 04/10/2023   Lab Results  Component Value Date   FERRITIN 240 08/30/2022  IRON 103 08/30/2022   TIBC 305 08/30/2022   UIBC 202 08/30/2022   IRONPCTSAT 34 08/30/2022   Lab Results  Component Value Date   RETICCTPCT 0.7 08/30/2022   RBC 3.28 (L) 04/10/2023   RETICCTABS 32.6 12/19/2013   No results found for: "KPAFRELGTCHN", "LAMBDASER", "KAPLAMBRATIO" No results found for: "IGGSERUM", "IGA", "IGMSERUM" No results found for: "TOTALPROTELP", "ALBUMINELP", "A1GS", "A2GS", "BETS", "BETA2SER", "GAMS", "MSPIKE", "SPEI"   Chemistry      Component Value Date/Time   NA 138 03/28/2023 1025   NA 144 11/29/2016 1054   NA 139 12/29/2015 0954   K 4.2 03/28/2023 1025   K 4.2 11/29/2016 1054   K 4.1 12/29/2015 0954   CL 105 03/28/2023 1025   CL 102 11/29/2016 1054   CO2 28 03/28/2023 1025   CO2 30 11/29/2016 1054   CO2 26 12/29/2015 0954   BUN 15 03/28/2023 1025   BUN 22 11/29/2016 1054   BUN 20.0 12/29/2015 0954   CREATININE 1.08 03/28/2023 1025   CREATININE 1.2 11/29/2016 1054   CREATININE 1.3 12/29/2015 0954      Component Value Date/Time   CALCIUM 8.6 (L) 03/28/2023 1025   CALCIUM 9.3 11/29/2016 1054   CALCIUM 9.5 12/29/2015 0954   ALKPHOS 97 03/28/2023 1025   ALKPHOS 92 (H) 11/29/2016 1054   ALKPHOS 98 12/29/2015 0954   AST 14 (L) 03/28/2023 1025   AST 16 12/29/2015 0954   ALT 13 03/28/2023 1025   ALT 20 11/29/2016 1054   ALT 13 12/29/2015 0954   BILITOT 0.5 03/28/2023 1025   BILITOT 0.71 12/29/2015 0954     Encounter Diagnoses  Name Primary?   Malignant neoplasm of tail of pancreas (HCC) Yes   Loss of weight    Personal history of urinary calculi    Unstable angina (HCC)    TMJ  syndrome    Diverticulosis of colon    Genetic testing    Elevated liver function tests    Angina at rest [facility])    Gastroesophageal reflux disease without esophagitis    Microscopic hematuria    Prediabetes    History of colonic polyps    Obstructive jaundice    Essential hypertension    Polycythemia vera (HCC)    Coronary artery disease due to lipid rich plaque    Other hemorrhoids    Idiopathic gout, unspecified chronicity, unspecified site    Chronic bilateral low back pain without sciatica    Hyperlipidemia, unspecified hyperlipidemia type    Mass of head of pancreas     Impression and Plan: Mr. Micciche is a pleasant 83 yo gentleman with original diagnosis of  polycythemia vera and now early stage pancreatic cancer.   We will go ahead and make a dosage adjustment with his chemotherapy.  This will be his 10th cycle.  He will have a total of 12 cycles and then undergo resection.  I would think that by the decrease in his CA 19-9 that he is responding.  Again, we need to make sure that he is tolerating treatment so he can undergo surgery at the appropriate time.  Will plan to go ahead and get him back in a couple weeks for his 11th cycle.     Josph Macho, MD 4/8/20258:21 AM

## 2023-04-10 NOTE — Progress Notes (Unsigned)
 Patient will proceed today with dose modified cycle ten. He is starting to feel more impact from his treatments. He is struggling to eat. He states that he does try to maintain intake, but his weight continues to drop. Nutrition is active on his care team.   Oncology Nurse Navigator Documentation     04/10/2023    8:00 AM  Oncology Nurse Navigator Flowsheets  Navigator Follow Up Date: 04/24/2023  Navigator Follow Up Reason: Follow-up Appointment;Chemotherapy  Navigator Location [facility]  Navigator Encounter Type Treatment  Patient Visit Type MedOnc  Treatment Phase Active Tx  Barriers/Navigation Needs No Barriers At This Time  Interventions Psycho-Social Support  Acuity Level 1-No Barriers  Support Groups/Services Friends and Family  Time Spent with Patient 15

## 2023-04-10 NOTE — Patient Instructions (Signed)

## 2023-04-11 ENCOUNTER — Other Ambulatory Visit

## 2023-04-11 ENCOUNTER — Ambulatory Visit: Admitting: Hematology & Oncology

## 2023-04-11 ENCOUNTER — Inpatient Hospital Stay

## 2023-04-11 ENCOUNTER — Encounter: Payer: Self-pay | Admitting: Hematology & Oncology

## 2023-04-11 ENCOUNTER — Ambulatory Visit

## 2023-04-11 ENCOUNTER — Other Ambulatory Visit: Payer: Self-pay

## 2023-04-11 LAB — CANCER ANTIGEN 19-9: CA 19-9: 43 U/mL — ABNORMAL HIGH (ref 0–35)

## 2023-04-12 ENCOUNTER — Inpatient Hospital Stay

## 2023-04-12 ENCOUNTER — Inpatient Hospital Stay: Admitting: Dietician

## 2023-04-12 DIAGNOSIS — C252 Malignant neoplasm of tail of pancreas: Secondary | ICD-10-CM

## 2023-04-12 DIAGNOSIS — Z5111 Encounter for antineoplastic chemotherapy: Secondary | ICD-10-CM | POA: Diagnosis not present

## 2023-04-12 MED ORDER — SODIUM CHLORIDE 0.9% FLUSH
10.0000 mL | INTRAVENOUS | Status: DC | PRN
  Administered 2023-04-12: 10 mL

## 2023-04-12 MED ORDER — HEPARIN SOD (PORK) LOCK FLUSH 100 UNIT/ML IV SOLN
500.0000 [IU] | Freq: Once | INTRAVENOUS | Status: AC | PRN
  Administered 2023-04-12: 500 [IU]

## 2023-04-12 NOTE — Progress Notes (Signed)
 Nutrition Follow-up:  Called patient at home. He admits he is feeling poorly today after treatment on 04/10/23.  He did say he ate well yesterday (Eggplant parmesan for dinner.  [place] and crackers, [place] toast) Planning on having oatmeal this morning.  He hasn't tried the recipe for the high protein chocolate pudding yet. He does report tolerating Hershey's dark chocolate with almonds.  He sent text asking for other more budget friendly options for protein supplements.  He has very positive outlook and is actively trying to explore all options to better health.  MD notes reflect treatments will proceed with a dose reduction to improve tolerance.       Medications: No supplement other than Vit D  Labs: 03/28/23 Glucose 270, Hgb 11.6  Anthropometrics:   Height: 71' Weight:  04/10/23  151# 03/28/23  153# UBW: 170# BMI: 21.06   Estimated Energy Needs   Kcals: 2135-2400 Protein: 73-92g Fluid: 2.4  NUTRITION DIAGNOSIS: Inadequate PO intake overall r/t dysgeusia to meet increased nutrient needs. Improving with increased weight and patient force feeding himself. Continues  INTERVENTION:   Encouraged MVI w/minerals to round out micronutrient while variety of food tolerance is limited.  Sent RD blog or protein supplements to his email which provides both benefits and cost comparison.  Encouraged use of small piece of the Hershey Chocolate after meals to eliminate poor lingering taste and allow improved tolerance to more nutrient dense intake.  Will send [employer] Lactose free powder sample to [facility] for his trial of taste acceptance.  MONITORING, EVALUATION, GOAL: weight trends, nutrition impact symptoms, PO intake, labs     Next Visit: remote next month   Gennaro Africa, RDN, LDN Registered Dietitian, [facility] Part Time Remote (Usual office hours: Tuesday-Thursday) Mobile: [phone]

## 2023-04-12 NOTE — Patient Instructions (Signed)

## 2023-04-13 ENCOUNTER — Encounter

## 2023-04-15 ENCOUNTER — Encounter: Payer: Self-pay | Admitting: Hematology & Oncology

## 2023-04-19 ENCOUNTER — Other Ambulatory Visit: Payer: Self-pay | Admitting: *Deleted

## 2023-04-19 ENCOUNTER — Encounter: Payer: Self-pay | Admitting: Hematology & Oncology

## 2023-04-19 DIAGNOSIS — C252 Malignant neoplasm of tail of pancreas: Secondary | ICD-10-CM

## 2023-04-19 DIAGNOSIS — K8689 Other specified diseases of pancreas: Secondary | ICD-10-CM

## 2023-04-19 MED ORDER — AMOXICILLIN 500 MG PO CAPS: ORAL_CAPSULE | ORAL | 0 refills | Status: DC

## 2023-04-24 ENCOUNTER — Inpatient Hospital Stay (HOSPITAL_BASED_OUTPATIENT_CLINIC_OR_DEPARTMENT_OTHER): Admitting: Hematology & Oncology

## 2023-04-24 ENCOUNTER — Inpatient Hospital Stay

## 2023-04-24 ENCOUNTER — Encounter: Payer: Self-pay | Admitting: *Deleted

## 2023-04-24 VITALS — BP 143/80 | HR 54 | Temp 97.5°F | Resp 18 | Ht 71.0 in | Wt 153.0 lb

## 2023-04-24 DIAGNOSIS — D45 Polycythemia vera: Secondary | ICD-10-CM

## 2023-04-24 DIAGNOSIS — R7303 Prediabetes: Secondary | ICD-10-CM

## 2023-04-24 DIAGNOSIS — M26629 Arthralgia of temporomandibular joint, unspecified side: Secondary | ICD-10-CM

## 2023-04-24 DIAGNOSIS — R3129 Other microscopic hematuria: Secondary | ICD-10-CM

## 2023-04-24 DIAGNOSIS — I2583 Coronary atherosclerosis due to lipid rich plaque: Secondary | ICD-10-CM

## 2023-04-24 DIAGNOSIS — K8689 Other specified diseases of pancreas: Secondary | ICD-10-CM

## 2023-04-24 DIAGNOSIS — G8929 Other chronic pain: Secondary | ICD-10-CM

## 2023-04-24 DIAGNOSIS — K648 Other hemorrhoids: Secondary | ICD-10-CM

## 2023-04-24 DIAGNOSIS — I2510 Atherosclerotic heart disease of native coronary artery without angina pectoris: Secondary | ICD-10-CM

## 2023-04-24 DIAGNOSIS — K831 Obstruction of bile duct: Secondary | ICD-10-CM

## 2023-04-24 DIAGNOSIS — R7989 Other specified abnormal findings of blood chemistry: Secondary | ICD-10-CM

## 2023-04-24 DIAGNOSIS — E785 Hyperlipidemia, unspecified: Secondary | ICD-10-CM

## 2023-04-24 DIAGNOSIS — Z1379 Encounter for other screening for genetic and chromosomal anomalies: Secondary | ICD-10-CM

## 2023-04-24 DIAGNOSIS — M1000 Idiopathic gout, unspecified site: Secondary | ICD-10-CM

## 2023-04-24 DIAGNOSIS — Z5111 Encounter for antineoplastic chemotherapy: Secondary | ICD-10-CM | POA: Diagnosis not present

## 2023-04-24 DIAGNOSIS — C252 Malignant neoplasm of tail of pancreas: Secondary | ICD-10-CM

## 2023-04-24 DIAGNOSIS — I10 Essential (primary) hypertension: Secondary | ICD-10-CM

## 2023-04-24 DIAGNOSIS — R634 Abnormal weight loss: Secondary | ICD-10-CM | POA: Diagnosis not present

## 2023-04-24 DIAGNOSIS — K219 Gastro-esophageal reflux disease without esophagitis: Secondary | ICD-10-CM

## 2023-04-24 DIAGNOSIS — Z860100 Personal history of colon polyps, unspecified: Secondary | ICD-10-CM

## 2023-04-24 DIAGNOSIS — I200 Unstable angina: Secondary | ICD-10-CM

## 2023-04-24 DIAGNOSIS — I2089 Other forms of angina pectoris: Secondary | ICD-10-CM

## 2023-04-24 DIAGNOSIS — M545 Low back pain, unspecified: Secondary | ICD-10-CM

## 2023-04-24 DIAGNOSIS — Z87442 Personal history of urinary calculi: Secondary | ICD-10-CM

## 2023-04-24 DIAGNOSIS — K5730 Diverticulosis of large intestine without perforation or abscess without bleeding: Secondary | ICD-10-CM

## 2023-04-24 LAB — CBC WITH DIFFERENTIAL (CANCER CENTER ONLY)
Abs Immature Granulocytes: 0.02 10*3/uL (ref 0.00–0.07)
Basophils Absolute: 0 10*3/uL (ref 0.0–0.1)
Basophils Relative: 1 %
Eosinophils Absolute: 0.1 10*3/uL (ref 0.0–0.5)
Eosinophils Relative: 3 %
HCT: 33.3 % — ABNORMAL LOW (ref 39.0–52.0)
Hemoglobin: 11.3 g/dL — ABNORMAL LOW (ref 13.0–17.0)
Immature Granulocytes: 0 %
Lymphocytes Relative: 31 %
Lymphs Abs: 1.4 10*3/uL (ref 0.7–4.0)
MCH: 33.9 pg (ref 26.0–34.0)
MCHC: 33.9 g/dL (ref 30.0–36.0)
MCV: 100 fL (ref 80.0–100.0)
Monocytes Absolute: 0.9 10*3/uL (ref 0.1–1.0)
Monocytes Relative: 21 %
Neutro Abs: 2 10*3/uL (ref 1.7–7.7)
Neutrophils Relative %: 44 %
Platelet Count: 123 10*3/uL — ABNORMAL LOW (ref 150–400)
RBC: 3.33 MIL/uL — ABNORMAL LOW (ref 4.22–5.81)
RDW: 14.6 % (ref 11.5–15.5)
WBC Count: 4.5 10*3/uL (ref 4.0–10.5)
nRBC: 0 % (ref 0.0–0.2)

## 2023-04-24 LAB — CMP (CANCER CENTER ONLY)
ALT: 14 U/L (ref 0–44)
AST: 16 U/L (ref 15–41)
Albumin: 3.6 g/dL (ref 3.5–5.0)
Alkaline Phosphatase: 85 U/L (ref 38–126)
Anion gap: 7 (ref 5–15)
BUN: 13 mg/dL (ref 8–23)
CO2: 26 mmol/L (ref 22–32)
Calcium: 8.8 mg/dL — ABNORMAL LOW (ref 8.9–10.3)
Chloride: 108 mmol/L (ref 98–111)
Creatinine: 1.07 mg/dL (ref 0.61–1.24)
GFR, Estimated: >60 mL/min (ref >60)
Glucose, Bld: 160 mg/dL — ABNORMAL HIGH (ref 70–99)
Potassium: 3.6 mmol/L (ref 3.5–5.1)
Sodium: 141 mmol/L (ref 135–145)
Total Bilirubin: 0.4 mg/dL (ref 0.0–1.2)
Total Protein: 5.7 g/dL — ABNORMAL LOW (ref 6.5–8.1)

## 2023-04-24 MED ORDER — ATROPINE SULFATE 1 MG/ML IV SOLN
0.5000 mg | Freq: Once | INTRAVENOUS | Status: AC | PRN
  Administered 2023-04-24: 0.5 mg via INTRAVENOUS
  Filled 2023-04-24: qty 1

## 2023-04-24 MED ORDER — DEXTROSE 5 % IV SOLN: INTRAVENOUS | Status: DC

## 2023-04-24 MED ORDER — DEXAMETHASONE SODIUM PHOSPHATE 10 MG/ML IJ SOLN
10.0000 mg | Freq: Once | INTRAMUSCULAR | Status: AC
  Administered 2023-04-24: 10 mg via INTRAVENOUS
  Filled 2023-04-24: qty 1

## 2023-04-24 MED ORDER — PALONOSETRON HCL INJECTION 0.25 MG/5ML
0.2500 mg | Freq: Once | INTRAVENOUS | Status: AC
  Administered 2023-04-24: 0.25 mg via INTRAVENOUS
  Filled 2023-04-24: qty 5

## 2023-04-24 MED ORDER — SODIUM CHLORIDE 0.9 % IV SOLN
1728.0000 mg/m2 | INTRAVENOUS | Status: DC
  Administered 2023-04-24: 3000 mg via INTRAVENOUS
  Filled 2023-04-24: qty 60

## 2023-04-24 MED ORDER — SODIUM CHLORIDE 0.9% FLUSH: 10.0000 mL | INTRAVENOUS | Status: DC | PRN

## 2023-04-24 MED ORDER — SODIUM CHLORIDE 0.9 % IV SOLN
100.0000 mg/m2 | Freq: Once | INTRAVENOUS | Status: AC
  Administered 2023-04-24: 180 mg via INTRAVENOUS
  Filled 2023-04-24: qty 5

## 2023-04-24 MED ORDER — HEPARIN SOD (PORK) LOCK FLUSH 100 UNIT/ML IV SOLN: 500.0000 [IU] | Freq: Once | INTRAVENOUS | Status: DC | PRN

## 2023-04-24 MED ORDER — SODIUM CHLORIDE 0.9 % IV SOLN
150.0000 mg | Freq: Once | INTRAVENOUS | Status: AC
  Administered 2023-04-24: 150 mg via INTRAVENOUS
  Filled 2023-04-24: qty 150

## 2023-04-24 MED ORDER — OXALIPLATIN CHEMO INJECTION 100 MG/20ML
100.0000 mg | Freq: Once | INTRAVENOUS | Status: AC
  Administered 2023-04-24: 100 mg via INTRAVENOUS
  Filled 2023-04-24: qty 20

## 2023-04-24 MED ORDER — SODIUM CHLORIDE 0.9 % IV SOLN
400.0000 mg/m2 | Freq: Once | INTRAVENOUS | Status: AC
  Administered 2023-04-24: 692 mg via INTRAVENOUS
  Filled 2023-04-24: qty 34.6

## 2023-04-24 NOTE — Patient Instructions (Addendum)
 [facility] - A DEPT OF [facility]  [facility]ITAL  Discharge Instructions: Thank you for choosing [facility] to provide your oncology and hematology care.   If you have a lab appointment with the Cancer Center, please go directly to the Cancer Center and check in at the registration area.  Wear comfortable clothing and clothing appropriate for easy access to any Portacath or PICC line.   We strive to give you quality time with your provider. You may need to reschedule your appointment if you arrive late (15 or more minutes).  Arriving late affects you and other patients whose appointments are after yours.  Also, if you miss three or more appointments without notifying the office, you may be dismissed from the clinic at the provider's discretion.      For prescription refill requests, have your pharmacy contact our office and allow 72 hours for refills to be completed.    Today you received the following chemotherapy and/or immunotherapy agents FOLFOXIRI      To help prevent nausea and vomiting after your treatment, we encourage you to take your nausea medication as directed.  BELOW ARE SYMPTOMS THAT SHOULD BE REPORTED IMMEDIATELY: *FEVER GREATER THAN 100.4 F (38 C) OR HIGHER *CHILLS OR SWEATING *NAUSEA AND VOMITING THAT IS NOT CONTROLLED WITH YOUR NAUSEA MEDICATION *UNUSUAL SHORTNESS OF BREATH *UNUSUAL BRUISING OR BLEEDING *URINARY PROBLEMS (pain or burning when urinating, or frequent urination) *BOWEL PROBLEMS (unusual diarrhea, constipation, pain near the anus) TENDERNESS IN MOUTH AND THROAT WITH OR WITHOUT PRESENCE OF ULCERS (sore throat, sores in mouth, or a toothache) UNUSUAL RASH, SWELLING OR PAIN  UNUSUAL VAGINAL DISCHARGE OR ITCHING   Items with * indicate a potential emergency and should be followed up as soon as possible or go to the Emergency Department if any problems should occur.  Please show the CHEMOTHERAPY ALERT CARD or IMMUNOTHERAPY  ALERT CARD at check-in to the Emergency Department and triage nurse. Should you have questions after your visit or need to cancel or reschedule your appointment, please contact [facility] - A DEPT OF [facility]  [phone] and follow the prompts.  Office hours are 8:00 a.m. to 4:30 p.m. Monday - Friday. Please note that voicemails left after 4:00 p.m. may not be returned until the following business day.  We are closed weekends and major holidays. You have access to a nurse at all times for urgent questions. Please call the main number to the clinic [phone] and follow the prompts.  For any non-urgent questions, you may also contact your provider using MyChart. We now offer e-Visits for anyone 24 and older to request care online for non-urgent symptoms. For details visit [email or web address].   Also download the MyChart app! Go to the app store, search "MyChart", open the app, select [place], and log in with your MyChart username and password.

## 2023-04-24 NOTE — Patient Instructions (Signed)

## 2023-04-24 NOTE — Progress Notes (Signed)
 Patient had better tolerance of his last dose modified cycle. He continues to work with dietary to increase intake. Alexander [name] had sent me nutritional supplement samples for patient and these were given to him today. He will proceed today with cycle 11/12.   Oncology Nurse Navigator Documentation     04/24/2023    8:30 AM  Oncology Nurse Navigator Flowsheets  Navigator Follow Up Date: 05/08/2023  Navigator Follow Up Reason: Follow-up Appointment;Chemotherapy  Navigator Location [facility]  Navigator Encounter Type Treatment  Patient Visit Type MedOnc  Treatment Phase Active Tx  Barriers/Navigation Needs No Barriers At This Time  Interventions Coordination of Care;Psycho-Social Support  Acuity Level 1-No Barriers  Coordination of Care Other  Support Groups/Services Friends and Family  Time Spent with Patient 15

## 2023-04-24 NOTE — Progress Notes (Signed)
 Hematology and Oncology Follow Up Visit  Alexander [name] [id number] 06-16-1940 83 y.o. 04/24/2023   Principle Diagnosis:  Adenocarcinoma of the pancreas-stage Ib (T2N0M0) --clinical stage --BRCA (-), KRAS (+), HRD(-)   Current Therapy:        Status post biliary stent placement FOLFIRINOX -- Neoadj - s/p cycle #10/12  - start on 10/31/2022   Interim History:  Alexander [name] is here today for follow-up and treatment.  He started to feel some of the effects of the chemotherapy.  I think this is a cumulative issue.  I did go ahead and decrease the dose of his chemotherapy the last time that we saw him.  I think that this dose is actually pretty good for him.  His blood counts look pretty good.  He is eating okay.  His weight is holding steady.  He does not have a lot of taste for food however.  There is been no problems with bleeding.  He has had no diarrhea.  Currently, his CA 19-9 is stable at 43.  He has had no cough or shortness of breath.  He really has had no problems with nausea.  He really has a strong constitution.  He is trying to walk.  I noticed for wife is having her health issues.  He is trying to help her.  Overall, his performance status is ECOG 1.   Wt Readings from Last 3 Encounters:  04/24/23 153 lb (69.4 kg)  04/10/23 151 lb (68.5 kg)  03/28/23 153 lb 1.9 oz (69.5 kg)   Medications:  Allergies as of 04/24/2023       Reactions   Metformin  And Related Diarrhea   Metformin  Hcl Diarrhea        Medication List        Accurate as of April 24, 2023  8:25 AM. If you have any questions, ask your nurse or doctor.          Accu-Chek Softclix Lancets lancets Use to test blood glucose once daily   amLODipine  10 MG tablet Commonly known as: NORVASC  Take 1 tablet (10 mg total) by mouth daily.   amoxicillin  500 MG capsule Commonly known as: AMOXIL  Take four capsules (2000 mg) one hour prior to dental procedure.   Droplet Pen Needles 31G X 6 MM  Misc Generic drug: Insulin  Pen Needle Use to check blood sugar TID   Embrace Pro Glucose Test test strip Generic drug: glucose blood Use as instructed   FreeStyle Libre 3 Plus Sensor Misc Change sensor every 15 days.   Gvoke HypoPen  1-Pack 1 MG/0.2ML Soaj Generic drug: Glucagon  Inject 1 mg into the skin as needed (low blood sugar with impaired consciousness).   insulin  lispro 100 UNIT/ML KwikPen Commonly known as: HumaLOG  KwikPen 2-3 units with meals 3 times a day plus sliding scale as instructed, maximum 20 units / day.   INSULIN  SYRINGE .3CC/30GX5/16" 30G X 5/16" 0.3 ML Misc Use for insulin  administration three times a day   Lantus  SoloStar 100 UNIT/ML Solostar Pen Generic drug: insulin  glargine INJECT 20 UNITS INTO THE SKIN 2 TIMES A DAY What changed: additional instructions   lipase/protease/amylase 40981 UNITS Cpep capsule Commonly known as: Creon  Take 2 capsules (72,000 Units total) by mouth 3 (three) times daily with meals. May also take 1 capsule (36,000 Units total) as needed (with snacks - up to 4 snacks daily).   loperamide  2 MG capsule Commonly known as: IMODIUM  Take 2 tabs by mouth with first loose stool, then 1 tab  with each additional loose stool as needed. Do not exceed 8 tabs in a 24-hour period   NovoLOG  FlexPen 100 UNIT/ML FlexPen Generic drug: insulin  aspart 2-3 units with meals 3 times a day plus sliding scale as instructed, maximum 20units/day What changed: additional instructions   OLANZapine  5 MG tablet Commonly known as: ZYPREXA  Take 1 tablet (5 mg total) by mouth at bedtime.   ondansetron  8 MG tablet Commonly known as: Zofran  Take 1 tablet (8 mg total) by mouth every 8 (eight) hours as needed for nausea or vomiting. Start on the third day after chemotherapy   pantoprazole  40 MG tablet Commonly known as: PROTONIX  Take 1 tablet (40 mg total) by mouth 2 (two) times daily before a meal.   prochlorperazine  10 MG tablet Commonly known as:  COMPAZINE  Take 1 tablet (10 mg total) by mouth every 6 (six) hours as needed for nausea or vomiting (Nausea or vomiting).   saccharomyces boulardii 250 MG capsule Commonly known as: FLORASTOR Take 250 mg by mouth 2 (two) times daily.   VITAMIN D (CHOLECALCIFEROL) PO Take by mouth 2 (two) times daily.        Allergies:  Allergies  Allergen Reactions   Metformin  And Related Diarrhea   Metformin  Hcl Diarrhea    Past Medical History, Surgical history, Social history, and Family History were reviewed and updated.  Review of Systems: Review of Systems  Constitutional: Negative.   HENT: Negative.    Eyes: Negative.   Respiratory: Negative.    Cardiovascular: Negative.   Gastrointestinal: Negative.   Genitourinary: Negative.   Musculoskeletal: Negative.   Skin: Negative.   Neurological: Negative.   Endo/Heme/Allergies: Negative.   Psychiatric/Behavioral: Negative.       Physical Exam:  height is 5\' 11"  (1.803 m) and weight is 153 lb (69.4 kg). His oral temperature is 97.5 F (36.4 C) (abnormal). His blood pressure is 143/80 (abnormal) and his pulse is 54 (abnormal). His respiration is 18 and oxygen saturation is 100%.   Wt Readings from Last 3 Encounters:  04/24/23 153 lb (69.4 kg)  04/10/23 151 lb (68.5 kg)  03/28/23 153 lb 1.9 oz (69.5 kg)    Physical Exam Vitals reviewed.  HENT:     Head: Normocephalic and atraumatic.  Eyes:     Pupils: Pupils are equal, round, and reactive to light.  Cardiovascular:     Rate and Rhythm: Normal rate and regular rhythm.     Heart sounds: Normal heart sounds.  Pulmonary:     Effort: Pulmonary effort is normal.     Breath sounds: Normal breath sounds.  Abdominal:     General: Bowel sounds are normal.     Palpations: Abdomen is soft.  Musculoskeletal:        General: No tenderness or deformity. Normal range of motion.     Cervical back: Normal range of motion.  Lymphadenopathy:     Cervical: No cervical adenopathy.   Skin:    General: Skin is warm and dry.     Findings: No erythema or rash.  Neurological:     Mental Status: He is alert and oriented to person, place, and time.  Psychiatric:        Behavior: Behavior normal.        Thought Content: Thought content normal.        Judgment: Judgment normal.     Lab Results  Component Value Date   WBC 4.5 04/24/2023   HGB 11.3 (L) 04/24/2023   HCT 33.3 (L) 04/24/2023  MCV 100.0 04/24/2023   PLT 123 (L) 04/24/2023   Lab Results  Component Value Date   FERRITIN 240 08/30/2022   IRON 103 08/30/2022   TIBC 305 08/30/2022   UIBC 202 08/30/2022   IRONPCTSAT 34 08/30/2022   Lab Results  Component Value Date   RETICCTPCT 0.7 08/30/2022   RBC 3.33 (L) 04/24/2023   RETICCTABS 32.6 12/19/2013   No results found for: "KPAFRELGTCHN", "LAMBDASER", "KAPLAMBRATIO" No results found for: "IGGSERUM", "IGA", "IGMSERUM" No results found for: "TOTALPROTELP", "ALBUMINELP", "A1GS", "A2GS", "BETS", "BETA2SER", "GAMS", "MSPIKE", "SPEI"   Chemistry      Component Value Date/Time   NA 140 04/10/2023 0742   NA 144 11/29/2016 1054   NA 139 12/29/2015 0954   K 3.9 04/10/2023 0742   K 4.2 11/29/2016 1054   K 4.1 12/29/2015 0954   CL 105 04/10/2023 0742   CL 102 11/29/2016 1054   CO2 27 04/10/2023 0742   CO2 30 11/29/2016 1054   CO2 26 12/29/2015 0954   BUN 16 04/10/2023 0742   BUN 22 11/29/2016 1054   BUN 20.0 12/29/2015 0954   CREATININE 1.14 04/10/2023 0742   CREATININE 1.2 11/29/2016 1054   CREATININE 1.3 12/29/2015 0954      Component Value Date/Time   CALCIUM  9.0 04/10/2023 0742   CALCIUM  9.3 11/29/2016 1054   CALCIUM  9.5 12/29/2015 0954   ALKPHOS 94 04/10/2023 0742   ALKPHOS 92 (H) 11/29/2016 1054   ALKPHOS 98 12/29/2015 0954   AST 14 (L) 04/10/2023 0742   AST 16 12/29/2015 0954   ALT 15 04/10/2023 0742   ALT 20 11/29/2016 1054   ALT 13 12/29/2015 0954   BILITOT 0.5 04/10/2023 0742   BILITOT 0.71 12/29/2015 0954       Impression  and Plan: Mr. Ferrari is a pleasant 83 yo gentleman with original diagnosis of  polycythemia vera and now early stage pancreatic cancer.   This to be his 11th cycle of chemotherapy.  He will have  and additional cycle after this.  He will then go to [facility] and then figure out when to have surgery.  After believe that everything is going incredibly well.  I would like to think that surgery should not be all that difficult although with his age, he could certainly have some postoperative challenges.  We will plan to get him back to see us  in another 2 weeks for his 12th and final cycle of treatment.    Ivor Mars, MD 4/22/20258:25 AM

## 2023-04-25 LAB — CANCER ANTIGEN 19-9: CA 19-9: 52 U/mL — ABNORMAL HIGH (ref 0–35)

## 2023-04-26 ENCOUNTER — Inpatient Hospital Stay

## 2023-04-26 VITALS — BP 130/68 | HR 58 | Temp 98.0°F | Resp 16

## 2023-04-26 DIAGNOSIS — C252 Malignant neoplasm of tail of pancreas: Secondary | ICD-10-CM

## 2023-04-26 DIAGNOSIS — Z5111 Encounter for antineoplastic chemotherapy: Secondary | ICD-10-CM | POA: Diagnosis not present

## 2023-04-26 MED ORDER — SODIUM CHLORIDE 0.9% FLUSH
10.0000 mL | INTRAVENOUS | Status: DC | PRN
  Administered 2023-04-26: 10 mL

## 2023-04-26 MED ORDER — HEPARIN SOD (PORK) LOCK FLUSH 100 UNIT/ML IV SOLN
500.0000 [IU] | Freq: Once | INTRAVENOUS | Status: AC | PRN
  Administered 2023-04-26: 500 [IU]

## 2023-04-26 NOTE — Patient Instructions (Signed)

## 2023-05-01 ENCOUNTER — Encounter: Payer: Self-pay | Admitting: Endocrinology

## 2023-05-01 ENCOUNTER — Ambulatory Visit (INDEPENDENT_AMBULATORY_CARE_PROVIDER_SITE_OTHER): Payer: Medicare HMO | Admitting: Endocrinology

## 2023-05-01 ENCOUNTER — Other Ambulatory Visit: Payer: Self-pay | Admitting: Adult Health

## 2023-05-01 VITALS — BP 124/60 | HR 65 | Resp 20 | Ht 71.0 in | Wt 152.8 lb

## 2023-05-01 DIAGNOSIS — E1165 Type 2 diabetes mellitus with hyperglycemia: Secondary | ICD-10-CM | POA: Diagnosis not present

## 2023-05-01 DIAGNOSIS — Z794 Long term (current) use of insulin: Secondary | ICD-10-CM

## 2023-05-01 LAB — POCT GLYCOSYLATED HEMOGLOBIN (HGB A1C): Hemoglobin A1C: 6.6 % — AB (ref 4.0–5.6)

## 2023-05-01 NOTE — Patient Instructions (Signed)
 Diabetes regimen:   Lantus  16 units daily in the morning once a day only.  Humalog  5-6 units with breakfast, 5-6 units with lunch and 5-6 units with supper.  If you exercise after breakfast take Humalog  5 units with breakfast also.  Mild Sliding Scale Blood Glucose        Insulin  60-150                     None 151-200                   1 unit 201-250                   2 units 251-300                   4 units 301-350                   6 units 351-400                   8 units      >400                        9 units and call provider   On chemo time with dexamethasone  okay to take lantus  20 units for 3 days only.

## 2023-05-01 NOTE — Progress Notes (Signed)
 Outpatient Endocrinology Note [place] Alexander Kamm, MD  05/01/23  Patient's Name: Alexander [name]    DOB: 1940/05/04    MRN: [id number]                                                    REASON OF VISIT: Follow-up for type 2 diabetes mellitus  REFERRING PROVIDER: Alto Atta, NP  PCP: Alto Atta, NP  HISTORY OF PRESENT ILLNESS:   Alexander [name] is a 83 y.o. old male with past medical history listed below, is here for follow-up for type 2 diabetes mellitus.   Pertinent Diabetes History: Patient has type 2 diabetes mellitus, based on hemoglobin A1c 6.6% at least from 2008, was controlled with mostly hemoglobin A1c in the range of 5.6 to 6.4% for several years, mostly in prediabetes range on lifestyle modification.  In March 2024 he had hemoglobin A1c elevated to 8.1%, was started on glipizide  however later he stopped due to patient concern of weight loss after few weeks.  He had tried metformin  in the past for short time, stopped due to GI intolerance/diarrhea.  He was diagnosed with adenocarcinoma of pancreas in September 2024, is being treated with neoadjuvant chemotherapy, possible surgery in the future. During the course of pancreatic cancer treatment he was started on insulin  therapy in October 2024, and referred to endocrinology for further evaluation and management of type 2 diabetes mellitus, was initially seen in December 2024.  She has been seeing dietitian/nutritionist as well.  Family h/o diabetes mellitus: mother and father had type 2 diabetes mellitus.Aaron Aas    He was started on Lantus  20 units 2 times a day and Humulin R  5 units with meals 3 times a day.  Lantus  evening dose was gradually decreased, was taking 10 units until 2 days ago and he stopped.  He continued on Lantus  20 units in the morning only.  Insulin  doses has been adjusted over time.  Chronic Diabetes Complications : Retinopathy: no , reportedly. Last ophthalmology exam was done on annually.  Nephropathy:  no Peripheral neuropathy: no Coronary artery disease: no Stroke: no  Relevant comorbidities and cardiovascular risk factors: Obesity: no Body mass index is 21.31 kg/m.  Hypertension: Yes  Hyperlipidemia : Yes, on statin   Current / Home Diabetic regimen includes:  Lantus  20 units in the morning daily.  Humalog  5-6 units with meals 3 times a day.   Prior diabetic medications: Glipizide , was treated in March 2024.  Metformin  had GI intolerance with diarrhea in the past.  Glycemic data:    CONTINUOUS GLUCOSE MONITORING SYSTEM (CGMS) INTERPRETATION: At today's visit, we reviewed CGM downloads. The full report is scanned in the media. Reviewing the CGM trends, blood glucose are as follows:  FreeStyle Libre 3 CGM-  Sensor Download (Sensor download was reviewed and summarized below.) Dates: April 16 to April 29 , 2025, 14 days Sensor Average: 158 Glucose Management Indicator: 7.1%    Interpretation: Mostly acceptable blood sugar.  He has trending down blood sugar by the morning with blood sugar mostly in the 80 to low 100 range and occasionally hypoglycemia with blood sugar in 60s.  Rare hypoglycemia in between the meals.  He has random hyperglycemia postprandially with blood sugar up to 300 range, related to the meals.  No concerning hypoglycemia.  Hypoglycemia: Patient has minor hypoglycemic episodes. Patient has hypoglycemia awareness.  Factors modifying glucose control: 1.  Diabetic diet assessment: Small and variable meals, loss of appetite with recent diagnosis of pancreatic cancer on chemotherapy, unpredictable eating.  2.  Staying active or exercising: none  3.  Medication compliance: compliant all of the time.  Other: He was diagnosed with adenocarcinoma of pancreas in September 2024, is being treated with neoadjuvant chemotherapy, possible surgery in the future.  With a plan to follow-up surgery in the future.  Has been following with oncology.  Interval history   CGM data as reviewed above.  Mostly acceptable blood sugar with random hypoglycemia with the meals and trending down blood sugar by the morning.  Diabetes regimen is reviewed and noted above.  He reports he has 1 more cycle of chemo.  He reports he has plan to see his surgeon in the middle of May and may be planning for pancreatic surgery.  No other complaints today.  REVIEW OF SYSTEMS As per history of present illness.   PAST MEDICAL HISTORY: Past Medical History:  Diagnosis Date   Arthritis    "minor; shoulders primarily" (05/20/2013)   CAD (coronary artery disease)    a. 05/2013 - Chest pain/unstable angina and dynamic EKG changes showing cath showing atherosclerotic coronary artery disease manifested as diffuse ectasia, normal EF.   Diverticulosis of colon    GERD (gastroesophageal reflux disease)    GI bleed    secondary to Aspirin    Gout    Hematuria, microscopic    Hemorrhoids    Hyperglycemia    Hyperlipidemia    Hypertension    Lumbar back pain    Microalbuminuria    Overweight(278.02)    Pancreatic cancer (HCC) 09/22/2022   Polycythemia rubra vera (HCC)    Tubular adenoma of colon 07/2012    PAST SURGICAL HISTORY: Past Surgical History:  Procedure Laterality Date   BILIARY STENT PLACEMENT N/A 09/02/2022   Procedure: BILIARY STENT PLACEMENT;  Surgeon: Tobin Forts, MD;  Location: [facility];  Service: Gastroenterology;  Laterality: N/A;   BILIARY STENT PLACEMENT  10/13/2022   Procedure: BILIARY STENT PLACEMENT;  Surgeon: Asencion Blacksmith, MD;  Location: [facility] ENDOSCOPY;  Service: Gastroenterology;;   BIOPSY  09/11/2022   Procedure: BIOPSY;  Surgeon: Normie Becton., MD;  Location: [facility] ENDOSCOPY;  Service: Gastroenterology;;   CARDIAC CATHETERIZATION  05/20/2013   CATARACT EXTRACTION W/ INTRAOCULAR LENS  IMPLANT, BILATERAL Bilateral 2008   CYSTECTOMY  ~ 2000   " SEBACEOUS cyst removed from between shoulder blades"   ENDOSCOPIC RETROGRADE CHOLANGIOPANCREATOGRAPHY  (ERCP) WITH PROPOFOL  N/A 09/02/2022   Procedure: ENDOSCOPIC RETROGRADE CHOLANGIOPANCREATOGRAPHY (ERCP) WITH PROPOFOL ;  Surgeon: Tobin Forts, MD;  Location: [facility];  Service: Gastroenterology;  Laterality: N/A;   ENDOSCOPIC RETROGRADE CHOLANGIOPANCREATOGRAPHY (ERCP) WITH PROPOFOL  N/A 10/13/2022   Procedure: ENDOSCOPIC RETROGRADE CHOLANGIOPANCREATOGRAPHY (ERCP) WITH PROPOFOL ;  Surgeon: Asencion Blacksmith, MD;  Location: [facility] ENDOSCOPY;  Service: Gastroenterology;  Laterality: N/A;   ESOPHAGOGASTRODUODENOSCOPY N/A 09/11/2022   Procedure: ESOPHAGOGASTRODUODENOSCOPY (EGD);  Surgeon: Normie Becton., MD;  Location: [facility];  Service: Gastroenterology;  Laterality: N/A;   EUS N/A 09/11/2022   Procedure: UPPER ENDOSCOPIC ULTRASOUND (EUS) RADIAL;  Surgeon: Normie Becton., MD;  Location: [facility] ENDOSCOPY;  Service: Gastroenterology;  Laterality: N/A;   EXCISIONAL HEMORRHOIDECTOMY  1970's   FINE NEEDLE ASPIRATION N/A 09/11/2022   Procedure: FINE NEEDLE ASPIRATION (FNA) LINEAR;  Surgeon: Normie Becton., MD;  Location: [facility] ENDOSCOPY;  Service: Gastroenterology;  Laterality: N/A;   INGUINAL HERNIA REPAIR Right ~ 2010   IR  IMAGING GUIDED PORT INSERTION  10/27/2022   LEFT HEART CATHETERIZATION WITH CORONARY ANGIOGRAM N/A 05/20/2013   Procedure: LEFT HEART CATHETERIZATION WITH CORONARY ANGIOGRAM;  Surgeon: Peter M Swaziland, MD;  Location: [facility] CATH LAB;  Service: Cardiovascular;  Laterality: N/A;   SPHINCTEROTOMY  09/02/2022   Procedure: SPHINCTEROTOMY;  Surgeon: Tobin Forts, MD;  Location: [facility];  Service: Gastroenterology;;   [place] REMOVAL  10/13/2022   Procedure: STENT REMOVAL;  Surgeon: Asencion Blacksmith, MD;  Location: [facility] ENDOSCOPY;  Service: Gastroenterology;;   TONSILLECTOMY AND ADENOIDECTOMY  1940's   VASECTOMY      ALLERGIES: Allergies  Allergen Reactions   Metformin  And Related Diarrhea   Metformin  Hcl Diarrhea    FAMILY HISTORY:  Family History  Problem Relation Age  of Onset   Colon cancer Mother        died at 81, colorectal   Diabetes Mother    Heart disease Father 56       MI   Diabetes Father    Stomach cancer Maternal Grandfather    Cancer Maternal Uncle     SOCIAL HISTORY: Social History   Socioeconomic History   Marital status: Married    Spouse name: Not on file   Number of children: 3   Years of education: Not on file   Highest education level: Some college, no degree  Occupational History   Occupation: retired Garment/textile technologist: RETIRED  Tobacco Use   Smoking status: Never   Smokeless tobacco: Never   Tobacco comments:    NEVER USED TOBACCO  Vaping Use   Vaping status: Never Used  Substance and Sexual Activity   Alcohol use: No    Alcohol/week: 0.0 standard drinks of alcohol   Drug use: No   Sexual activity: Not Currently    Partners: Female  Other Topics Concern   Not on file  Social History Narrative   Marriedfor 52 years    Daughter, Physicist, medical (pediatrician--lives in [place], [place]), one in [place] - Warden/ranger. One in [place] - Chartered loss adjuster          Social Drivers of Health   Financial Resource Strain: Low Risk  (10/16/2022)   Overall Financial Resource Strain (CARDIA)    Difficulty of Paying Living Expenses: Not hard at all  Food Insecurity: No Food Insecurity (11/08/2022)   Hunger Vital Sign    Worried About Running Out of Food in the Last Year: Never true    Ran Out of Food in the Last Year: Never true  Transportation Needs: No Transportation Needs (11/08/2022)   PRAPARE - Administrator, Civil Service (Medical): No    Lack of Transportation (Non-Medical): No  Physical Activity: Sufficiently Active (10/16/2022)   Exercise Vital Sign    Days of Exercise per Week: 3 days    Minutes of Exercise per Session: 60 min  Stress: No Stress Concern Present (10/16/2022)   [employer] of Occupational Health - Occupational Stress Questionnaire    Feeling of Stress : Only a little  Social Connections:  Socially Integrated (10/16/2022)   Social Connection and Isolation Panel [NHANES]    Frequency of Communication with Friends and Family: More than three times a week    Frequency of Social Gatherings with Friends and Family: More than three times a week    Attends Religious Services: 1 to 4 times per year    Active Member of [employer] or Organizations: Yes    Attends Banker Meetings: Never  Marital Status: Married    MEDICATIONS:  Current Outpatient Medications  Medication Sig Dispense Refill   Accu-Chek Softclix Lancets lancets Use to test blood glucose once daily 100 each 3   amLODipine  (NORVASC ) 10 MG tablet Take 1 tablet (10 mg total) by mouth daily. 90 tablet 3   Continuous Glucose Sensor (FREESTYLE LIBRE 3 PLUS SENSOR) MISC Change sensor every 15 days. 2 each 6   Glucagon  (GVOKE HYPOPEN  1-PACK) 1 MG/0.2ML SOAJ Inject 1 mg into the skin as needed (low blood sugar with impaired consciousness). 0.4 mL 2   glucose blood (EMBRACE PRO GLUCOSE TEST) test strip Use as instructed 100 each 12   insulin  glargine (LANTUS  SOLOSTAR) 100 UNIT/ML Solostar Pen INJECT 20 UNITS INTO THE SKIN 2 TIMES A DAY (Patient taking differently: 03/28/2023 INJECT 20 UNITS INTO THE SKIN daily) 12 mL 0   insulin  lispro (HUMALOG  KWIKPEN) 100 UNIT/ML KwikPen 2-3 units with meals 3 times a day plus sliding scale as instructed, maximum 20 units / day. 15 mL 11   Insulin  Pen Needle (DROPLET PEN NEEDLES) 31G X 6 MM MISC Use to check blood sugar TID 100 each 0   Insulin  Syringe-Needle U-100 (INSULIN  SYRINGE .3CC/30GX5/16") 30G X 5/16" 0.3 ML MISC Use for insulin  administration three times a day 300 each 3   lipase/protease/amylase (CREON ) 36000 UNITS CPEP capsule Take 2 capsules (72,000 Units total) by mouth 3 (three) times daily with meals. May also take 1 capsule (36,000 Units total) as needed (with snacks - up to 4 snacks daily). 180 capsule 5   saccharomyces boulardii (FLORASTOR) 250 MG capsule Take 250 mg by  mouth 2 (two) times daily.     VITAMIN D, CHOLECALCIFEROL, PO Take by mouth 2 (two) times daily.     amoxicillin  (AMOXIL ) 500 MG capsule Take four capsules (2000 mg) one hour prior to dental procedure. (Patient not taking: Reported on 05/01/2023) 4 capsule 0   insulin  aspart (NOVOLOG  FLEXPEN) 100 UNIT/ML FlexPen 2-3 units with meals 3 times a day plus sliding scale as instructed, maximum 20units/day (Patient not taking: Reported on 05/01/2023) 15 mL 11   loperamide  (IMODIUM ) 2 MG capsule Take 2 tabs by mouth with first loose stool, then 1 tab with each additional loose stool as needed. Do not exceed 8 tabs in a 24-hour period (Patient not taking: Reported on 05/01/2023) 100 capsule 3   OLANZapine  (ZYPREXA ) 5 MG tablet Take 1 tablet (5 mg total) by mouth at bedtime. (Patient not taking: Reported on 05/01/2023) 30 tablet 4   ondansetron  (ZOFRAN ) 8 MG tablet Take 1 tablet (8 mg total) by mouth every 8 (eight) hours as needed for nausea or vomiting. Start on the third day after chemotherapy (Patient not taking: Reported on 05/01/2023) 30 tablet 1   pantoprazole  (PROTONIX ) 40 MG tablet Take 1 tablet (40 mg total) by mouth 2 (two) times daily before a meal. 180 tablet 2   prochlorperazine  (COMPAZINE ) 10 MG tablet Take 1 tablet (10 mg total) by mouth every 6 (six) hours as needed for nausea or vomiting (Nausea or vomiting). (Patient not taking: Reported on 05/01/2023) 30 tablet 1   No current facility-administered medications for this visit.    PHYSICAL EXAM: Vitals:   05/01/23 1428  BP: 124/60  Pulse: 65  Resp: 20  SpO2: 98%  Weight: 152 lb 12.8 oz (69.3 kg)  Height: 5\' 11"  (1.803 m)    Body mass index is 21.31 kg/m.  Wt Readings from Last 3 Encounters:  05/01/23 152 lb 12.8  oz (69.3 kg)  04/24/23 153 lb (69.4 kg)  04/10/23 151 lb (68.5 kg)    General: Well developed, well nourished male in no apparent distress.  HEENT: AT/[place], no external lesions.  Eyes: Conjunctiva clear and no icterus. Neck:  Neck supple  Lungs: Respirations not labored Neurologic: Alert, oriented, normal speech Extremities / Skin: Dry.  Psychiatric: Does not appear depressed or anxious  Diabetic Foot Exam - Simple   No data filed    LABS Reviewed Lab Results  Component Value Date   HGBA1C 6.6 (A) 05/01/2023   HGBA1C 6.1 (A) 01/31/2023   HGBA1C 6.2 (A) 12/14/2022   No results found for: "FRUCTOSAMINE" Lab Results  Component Value Date   CHOL 170 03/16/2021   HDL 47.70 03/16/2021   LDLCALC 85 03/16/2021   TRIG 183.0 (H) 03/16/2021   CHOLHDL 4 03/16/2021   Lab Results  Component Value Date   MICRALBCREAT 56 (H) 01/31/2023   MICRALBCREAT 26.7 06/11/2007   Lab Results  Component Value Date   CREATININE 1.07 04/24/2023   Lab Results  Component Value Date   GFR 56.87 (L) 09/05/2022    ASSESSMENT / PLAN  1. Type 2 diabetes mellitus with hyperglycemia, with long-term current use of insulin  (HCC)    Diabetes Mellitus type 2, complicated by no known complication. - Diabetic status / severity: Controlled with hypo and hyperglycemia.  Lab Results  Component Value Date   HGBA1C 6.6 (A) 05/01/2023    - Hemoglobin A1c goal : <7%  Patient has long history of type 2 diabetes mellitus/prediabetes for several years, was controlled on lifestyle modification.  Multidose insulin  therapy was started in October 2024 after the diagnosis of pancreatic cancer during the course of chemotherapy treatment.  Patient has been getting dexamethasone  steroid during the chemotherapy cycle.  He is still on chemotherapy cycle, following with oncology.  Patient needs close monitoring and frequent adjustment of insulin  regimen while on active treatment for pancreatic cancer on chemotherapy and after anticipated Whipple's procedure.  CGM data as reviewed above.   - Medications: See below.  Adjusted as follows.  Diabetes regimen:  Decreased Lantus  from 20 to 16 units in the morning daily.  Okay to take Lantus  20  units at the time of chemotherapy with dexamethasone  for 3 days only.  Humalog  5-6 units with breakfast, 5-6 units with lunch and 5-6 units with supper plus sliding scale blood sugar before eating as follows.  He feels comfortable adjusting Humalog  for meals.  Advised to take 1 or 2 unit additional for large carbohydrate containing meals to avoid hyperglycemia.  Mild Sliding Scale Blood Glucose        Insulin  60-150                     None 151-200                   1 unit 201-250                   2 units 251-300                   4 units 301-350                   6 units 351-400                   8 units      >400  9 units and call provider    - Home glucose testing: Freestyle libre 3 and check as needed.  - Discussed/ Gave Hypoglycemia treatment plan.  Advised to take glucose tablet 2 to 4 units for correcting hypoglycemia.  Advised to get it from pharmacy over-the-counter.  Glucagon  emergency kit prescribed.  # Consult : not required at this time.   # Annual urine for microalbuminuria/ creatinine ratio, + microalbuminuria currently.    Last  Lab Results  Component Value Date   MICRALBCREAT 56 (H) 01/31/2023    # Foot check nightly / neuropathy.  # Annual dilated diabetic eye exams.    2. Blood pressure  -  BP Readings from Last 1 Encounters:  05/01/23 124/60    - Control is in target. Repeat BP : 140/78 mmHg - No change in current plans.  3. Lipid status / Hyperlipidemia - Last  Lab Results  Component Value Date   LDLCALC 85 03/16/2021    Diagnoses and all orders for this visit:  Type 2 diabetes mellitus with hyperglycemia, with long-term current use of insulin  (HCC) -     POCT glycosylated hemoglobin (Hb A1C)    DISPOSITION Follow up in clinic in 3 months suggested.  Asked to call our clinic in between the visits in regard to question or concern regarding blood glucose.   All questions answered and patient verbalized understanding  of the plan.  [place] Amarisa Wilinski, MD [facility] Endocrinology [facility] [address] [place], [address] [place], [place] [address] Phone # [phone]  At least part of this note was generated using voice recognition software. Inadvertent word errors may have occurred, which were not recognized during the proofreading process.

## 2023-05-02 ENCOUNTER — Other Ambulatory Visit: Payer: Self-pay

## 2023-05-08 ENCOUNTER — Inpatient Hospital Stay (HOSPITAL_BASED_OUTPATIENT_CLINIC_OR_DEPARTMENT_OTHER): Admitting: Family

## 2023-05-08 ENCOUNTER — Encounter: Payer: Self-pay | Admitting: Hematology & Oncology

## 2023-05-08 ENCOUNTER — Encounter: Payer: Self-pay | Admitting: Family

## 2023-05-08 ENCOUNTER — Encounter: Payer: Self-pay | Admitting: *Deleted

## 2023-05-08 ENCOUNTER — Other Ambulatory Visit: Payer: Self-pay

## 2023-05-08 ENCOUNTER — Inpatient Hospital Stay: Attending: Hematology & Oncology

## 2023-05-08 ENCOUNTER — Inpatient Hospital Stay

## 2023-05-08 VITALS — BP 133/90 | HR 98 | Temp 98.0°F | Resp 18 | Ht 71.0 in | Wt 152.4 lb

## 2023-05-08 VITALS — BP 127/64 | HR 74

## 2023-05-08 DIAGNOSIS — G8929 Other chronic pain: Secondary | ICD-10-CM

## 2023-05-08 DIAGNOSIS — M545 Low back pain, unspecified: Secondary | ICD-10-CM

## 2023-05-08 DIAGNOSIS — Z87442 Personal history of urinary calculi: Secondary | ICD-10-CM

## 2023-05-08 DIAGNOSIS — I2510 Atherosclerotic heart disease of native coronary artery without angina pectoris: Secondary | ICD-10-CM

## 2023-05-08 DIAGNOSIS — I10 Essential (primary) hypertension: Secondary | ICD-10-CM

## 2023-05-08 DIAGNOSIS — R634 Abnormal weight loss: Secondary | ICD-10-CM | POA: Diagnosis not present

## 2023-05-08 DIAGNOSIS — I2583 Coronary atherosclerosis due to lipid rich plaque: Secondary | ICD-10-CM

## 2023-05-08 DIAGNOSIS — C252 Malignant neoplasm of tail of pancreas: Secondary | ICD-10-CM

## 2023-05-08 DIAGNOSIS — Z1379 Encounter for other screening for genetic and chromosomal anomalies: Secondary | ICD-10-CM

## 2023-05-08 DIAGNOSIS — C259 Malignant neoplasm of pancreas, unspecified: Secondary | ICD-10-CM | POA: Diagnosis present

## 2023-05-08 DIAGNOSIS — K219 Gastro-esophageal reflux disease without esophagitis: Secondary | ICD-10-CM

## 2023-05-08 DIAGNOSIS — R7989 Other specified abnormal findings of blood chemistry: Secondary | ICD-10-CM

## 2023-05-08 DIAGNOSIS — Z5111 Encounter for antineoplastic chemotherapy: Secondary | ICD-10-CM | POA: Insufficient documentation

## 2023-05-08 DIAGNOSIS — Z860100 Personal history of colon polyps, unspecified: Secondary | ICD-10-CM

## 2023-05-08 DIAGNOSIS — R7303 Prediabetes: Secondary | ICD-10-CM

## 2023-05-08 DIAGNOSIS — R3129 Other microscopic hematuria: Secondary | ICD-10-CM

## 2023-05-08 DIAGNOSIS — M26629 Arthralgia of temporomandibular joint, unspecified side: Secondary | ICD-10-CM | POA: Diagnosis not present

## 2023-05-08 DIAGNOSIS — D45 Polycythemia vera: Secondary | ICD-10-CM

## 2023-05-08 DIAGNOSIS — K5730 Diverticulosis of large intestine without perforation or abscess without bleeding: Secondary | ICD-10-CM

## 2023-05-08 DIAGNOSIS — K8689 Other specified diseases of pancreas: Secondary | ICD-10-CM

## 2023-05-08 DIAGNOSIS — K648 Other hemorrhoids: Secondary | ICD-10-CM

## 2023-05-08 DIAGNOSIS — I2089 Other forms of angina pectoris: Secondary | ICD-10-CM

## 2023-05-08 DIAGNOSIS — I200 Unstable angina: Secondary | ICD-10-CM

## 2023-05-08 DIAGNOSIS — K831 Obstruction of bile duct: Secondary | ICD-10-CM

## 2023-05-08 DIAGNOSIS — E785 Hyperlipidemia, unspecified: Secondary | ICD-10-CM

## 2023-05-08 DIAGNOSIS — D509 Iron deficiency anemia, unspecified: Secondary | ICD-10-CM

## 2023-05-08 DIAGNOSIS — M1000 Idiopathic gout, unspecified site: Secondary | ICD-10-CM

## 2023-05-08 LAB — CBC WITH DIFFERENTIAL (CANCER CENTER ONLY)
Abs Immature Granulocytes: 0.02 10*3/uL (ref 0.00–0.07)
Basophils Absolute: 0 10*3/uL (ref 0.0–0.1)
Basophils Relative: 0 %
Eosinophils Absolute: 0.1 10*3/uL (ref 0.0–0.5)
Eosinophils Relative: 1 %
HCT: 36.5 % — ABNORMAL LOW (ref 39.0–52.0)
Hemoglobin: 12.5 g/dL — ABNORMAL LOW (ref 13.0–17.0)
Immature Granulocytes: 0 %
Lymphocytes Relative: 19 %
Lymphs Abs: 1 10*3/uL (ref 0.7–4.0)
MCH: 34.2 pg — ABNORMAL HIGH (ref 26.0–34.0)
MCHC: 34.2 g/dL (ref 30.0–36.0)
MCV: 100 fL (ref 80.0–100.0)
Monocytes Absolute: 0.7 10*3/uL (ref 0.1–1.0)
Monocytes Relative: 13 %
Neutro Abs: 3.5 10*3/uL (ref 1.7–7.7)
Neutrophils Relative %: 67 %
Platelet Count: 159 10*3/uL (ref 150–400)
RBC: 3.65 MIL/uL — ABNORMAL LOW (ref 4.22–5.81)
RDW: 15.1 % (ref 11.5–15.5)
WBC Count: 5.2 10*3/uL (ref 4.0–10.5)
nRBC: 0 % (ref 0.0–0.2)

## 2023-05-08 LAB — CMP (CANCER CENTER ONLY)
ALT: 20 U/L (ref 0–44)
AST: 20 U/L (ref 15–41)
Albumin: 4 g/dL (ref 3.5–5.0)
Alkaline Phosphatase: 90 U/L (ref 38–126)
Anion gap: 10 (ref 5–15)
BUN: 14 mg/dL (ref 8–23)
CO2: 24 mmol/L (ref 22–32)
Calcium: 9.1 mg/dL (ref 8.9–10.3)
Chloride: 106 mmol/L (ref 98–111)
Creatinine: 0.97 mg/dL (ref 0.61–1.24)
GFR, Estimated: >60 mL/min (ref >60)
Glucose, Bld: 258 mg/dL — ABNORMAL HIGH (ref 70–99)
Potassium: 3.8 mmol/L (ref 3.5–5.1)
Sodium: 140 mmol/L (ref 135–145)
Total Bilirubin: 0.5 mg/dL (ref 0.0–1.2)
Total Protein: 6.5 g/dL (ref 6.5–8.1)

## 2023-05-08 LAB — IRON AND IRON BINDING CAPACITY (CC-WL,HP ONLY)
Iron: 67 ug/dL (ref 45–182)
Saturation Ratios: 23 % (ref 17.9–39.5)
TIBC: 297 ug/dL (ref 250–450)
UIBC: 230 ug/dL (ref 117–376)

## 2023-05-08 LAB — FERRITIN: Ferritin: 160 ng/mL (ref 24–336)

## 2023-05-08 MED ORDER — PALONOSETRON HCL INJECTION 0.25 MG/5ML
0.2500 mg | Freq: Once | INTRAVENOUS | Status: AC
  Administered 2023-05-08: 0.25 mg via INTRAVENOUS
  Filled 2023-05-08: qty 5

## 2023-05-08 MED ORDER — OXALIPLATIN CHEMO INJECTION 100 MG/20ML
61.2000 mg/m2 | Freq: Once | INTRAVENOUS | Status: AC
  Administered 2023-05-08: 100 mg via INTRAVENOUS
  Filled 2023-05-08: qty 20

## 2023-05-08 MED ORDER — DEXAMETHASONE SODIUM PHOSPHATE 10 MG/ML IJ SOLN
10.0000 mg | Freq: Once | INTRAMUSCULAR | Status: AC
  Administered 2023-05-08: 10 mg via INTRAVENOUS
  Filled 2023-05-08: qty 1

## 2023-05-08 MED ORDER — SODIUM CHLORIDE 0.9 % IV SOLN
1728.0000 mg/m2 | INTRAVENOUS | Status: DC
  Administered 2023-05-08: 3000 mg via INTRAVENOUS
  Filled 2023-05-08: qty 60

## 2023-05-08 MED ORDER — SODIUM CHLORIDE 0.9 % IV SOLN
100.0000 mg/m2 | Freq: Once | INTRAVENOUS | Status: AC
  Administered 2023-05-08: 180 mg via INTRAVENOUS
  Filled 2023-05-08: qty 5

## 2023-05-08 MED ORDER — SODIUM CHLORIDE 0.9 % IV SOLN
400.0000 mg/m2 | Freq: Once | INTRAVENOUS | Status: AC
  Administered 2023-05-08: 692 mg via INTRAVENOUS
  Filled 2023-05-08: qty 34.6

## 2023-05-08 MED ORDER — SODIUM CHLORIDE 0.9 % IV SOLN
400.0000 mg/m2 | Freq: Once | INTRAVENOUS | Status: DC
  Filled 2023-05-08: qty 34.6

## 2023-05-08 MED ORDER — ATROPINE SULFATE 1 MG/ML IV SOLN
0.5000 mg | Freq: Once | INTRAVENOUS | Status: AC | PRN
  Administered 2023-05-08: 0.5 mg via INTRAVENOUS
  Filled 2023-05-08: qty 1

## 2023-05-08 MED ORDER — SODIUM CHLORIDE 0.9 % IV SOLN
150.0000 mg | Freq: Once | INTRAVENOUS | Status: AC
  Administered 2023-05-08: 150 mg via INTRAVENOUS
  Filled 2023-05-08: qty 150

## 2023-05-08 MED ORDER — DEXTROSE 5 % IV SOLN: INTRAVENOUS | Status: DC

## 2023-05-08 NOTE — Progress Notes (Signed)
 Patient here for final chemo cycle before being reconsidered for surgery at [facility]. He will complete cycle twelve this week. He is already scheduled for CT and surgical follow up at [facility] on 05/14/2023.   Oncology Nurse Navigator Documentation     05/08/2023    8:30 AM  Oncology Nurse Navigator Flowsheets  Navigator Follow Up Date: 05/14/2023  Navigator Follow Up Reason: Review Note  Navigator Location [facility]  Navigator Encounter Type Treatment  Patient Visit Type MedOnc  Treatment Phase Final Chemo TX  Barriers/Navigation Needs No Barriers At This Time  Interventions Psycho-Social Support  Acuity Level 1-No Barriers  Support Groups/Services Friends and Family  Time Spent with Patient 15

## 2023-05-08 NOTE — Progress Notes (Signed)
 Hematology and Oncology Follow Up Visit  [name] [id number] [birth year] 83 y.o. 05/08/2023   Principle Diagnosis:  Adenocarcinoma of the pancreas-stage Ib (T2N0M0) --clinical stage --BRCA (-), KRAS (+), HRD(-)   Current Therapy:        Status post biliary stent placement FOLFIRINOX -- Neoadj - s/p cycle 11/12  - start on 10/31/2022   Interim History:  [name] is here today for follow-up and cycle 12 of treatment. He has his appointment with [facility] surgery team on 5/13. He states that he will see the MD and have CT scans that day.  He has had some issues with hypoglycemia recently. He is eating a better but did wake up last night with a glucose of 50. He is feeling fatigued from lack of sleep. No change in LOC.  No fever, chills, n/v, cough, rash, dizziness, SOB, chest pain, palpitations, abdominal pain or changes in bowel or bladder habits at this time.  Occasional dizziness also associated with low glucose.  No falls or syncope reported.  Appetite and hydration are good at this time.  No swelling of tingling in his extremities.  No blood loss or petechiae noted.   ECOG Performance Status: 1 - Symptomatic but completely ambulatory  Medications:  Allergies as of 05/08/2023       Reactions   Metformin  And Related Diarrhea   Metformin  Hcl Diarrhea        Medication List        Accurate as of May 08, 2023  8:51 AM. If you have any questions, ask your nurse or doctor.          Accu-Chek Softclix Lancets lancets Use to test blood glucose once daily   amLODipine  10 MG tablet Commonly known as: NORVASC  Take 1 tablet (10 mg total) by mouth daily.   amoxicillin  500 MG capsule Commonly known as: AMOXIL  Take four capsules (2000 mg) one hour prior to dental procedure.   Droplet Pen Needles 31G X 6 MM Misc Generic drug: Insulin  Pen Needle Use to check blood sugar TID   BD Pen Needle Nano 2nd Gen 32G X 4 MM Misc Generic drug: Insulin  Pen Needle   Embrace Pro  Glucose Test test strip Generic drug: glucose blood Use as instructed   FreeStyle Libre 3 Plus Sensor Misc Change sensor every 15 days.   Gvoke HypoPen  1-Pack 1 MG/0.2ML Soaj Generic drug: Glucagon  Inject 1 mg into the skin as needed (low blood sugar with impaired consciousness).   insulin  lispro 100 UNIT/ML KwikPen Commonly known as: HumaLOG  KwikPen 2-3 units with meals 3 times a day plus sliding scale as instructed, maximum 20 units / day.   INSULIN  SYRINGE .3CC/30GX5/16" 30G X 5/16" 0.3 ML Misc Use for insulin  administration three times a day   Lantus  SoloStar 100 UNIT/ML Solostar Pen Generic drug: insulin  glargine INJECT 20 UNITS INTO THE SKIN 2 TIMES A DAY What changed: additional instructions   lipase/protease/amylase 40981 UNITS Cpep capsule Commonly known as: Creon  Take 2 capsules (72,000 Units total) by mouth 3 (three) times daily with meals. May also take 1 capsule (36,000 Units total) as needed (with snacks - up to 4 snacks daily).   loperamide  2 MG capsule Commonly known as: IMODIUM  Take 2 tabs by mouth with first loose stool, then 1 tab with each additional loose stool as needed. Do not exceed 8 tabs in a 24-hour period   NovoLOG  FlexPen 100 UNIT/ML FlexPen Generic drug: insulin  aspart 2-3 units with meals 3 times a day  plus sliding scale as instructed, maximum 20units/day   OLANZapine  5 MG tablet Commonly known as: ZYPREXA  Take 1 tablet (5 mg total) by mouth at bedtime.   ondansetron  8 MG tablet Commonly known as: Zofran  Take 1 tablet (8 mg total) by mouth every 8 (eight) hours as needed for nausea or vomiting. Start on the third day after chemotherapy   pantoprazole  40 MG tablet Commonly known as: PROTONIX  Take 1 tablet (40 mg total) by mouth 2 (two) times daily before a meal.   prochlorperazine  10 MG tablet Commonly known as: COMPAZINE  Take 1 tablet (10 mg total) by mouth every 6 (six) hours as needed for nausea or vomiting (Nausea or vomiting).    saccharomyces boulardii 250 MG capsule Commonly known as: FLORASTOR Take 250 mg by mouth 2 (two) times daily.   VITAMIN D (CHOLECALCIFEROL) PO Take by mouth 2 (two) times daily.        Allergies:  Allergies  Allergen Reactions   Metformin  And Related Diarrhea   Metformin  Hcl Diarrhea    Past Medical History, Surgical history, Social history, and Family History were reviewed and updated.  Review of Systems: All other 10 point review of systems is negative.   Physical Exam:  height is 5\' 11"  (1.803 m) and weight is 152 lb 6.4 oz (69.1 kg). His oral temperature is 98 F (36.7 C). His blood pressure is 133/90 (abnormal) and his pulse is 98. His respiration is 18 and oxygen saturation is 100%.   Wt Readings from Last 3 Encounters:  05/08/23 152 lb 6.4 oz (69.1 kg)  05/01/23 152 lb 12.8 oz (69.3 kg)  04/24/23 153 lb (69.4 kg)    Ocular: Sclerae unicteric, pupils equal, round and reactive to light Ear-nose-throat: Oropharynx clear, dentition fair Lymphatic: No cervical or supraclavicular adenopathy Lungs no rales or rhonchi, good excursion bilaterally Heart regular rate and rhythm, no murmur appreciated Abd soft, nontender, positive bowel sounds MSK no focal spinal tenderness, no joint edema Neuro: non-focal, well-oriented, appropriate affect Breasts: Deferred   Lab Results  Component Value Date   WBC 5.2 05/08/2023   HGB 12.5 (L) 05/08/2023   HCT 36.5 (L) 05/08/2023   MCV 100.0 05/08/2023   PLT 159 05/08/2023   Lab Results  Component Value Date   FERRITIN 240 08/30/2022   IRON 103 08/30/2022   TIBC 305 08/30/2022   UIBC 202 08/30/2022   IRONPCTSAT 34 08/30/2022   Lab Results  Component Value Date   RETICCTPCT 0.7 08/30/2022   RBC 3.65 (L) 05/08/2023   RETICCTABS 32.6 12/19/2013   No results found for: "KPAFRELGTCHN", "LAMBDASER", "KAPLAMBRATIO" No results found for: "IGGSERUM", "IGA", "IGMSERUM" No results found for: "TOTALPROTELP", "ALBUMINELP", "A1GS",  "A2GS", "BETS", "BETA2SER", "GAMS", "MSPIKE", "SPEI"   Chemistry      Component Value Date/Time   NA 141 04/24/2023 0800   NA 144 11/29/2016 1054   NA 139 12/29/2015 0954   K 3.6 04/24/2023 0800   K 4.2 11/29/2016 1054   K 4.1 12/29/2015 0954   CL 108 04/24/2023 0800   CL 102 11/29/2016 1054   CO2 26 04/24/2023 0800   CO2 30 11/29/2016 1054   CO2 26 12/29/2015 0954   BUN 13 04/24/2023 0800   BUN 22 11/29/2016 1054   BUN 20.0 12/29/2015 0954   CREATININE 1.07 04/24/2023 0800   CREATININE 1.2 11/29/2016 1054   CREATININE 1.3 12/29/2015 0954      Component Value Date/Time   CALCIUM  8.8 (L) 04/24/2023 0800   CALCIUM  9.3 11/29/2016  1054   CALCIUM  9.5 12/29/2015 0954   ALKPHOS 85 04/24/2023 0800   ALKPHOS 92 (H) 11/29/2016 1054   ALKPHOS 98 12/29/2015 0954   AST 16 04/24/2023 0800   AST 16 12/29/2015 0954   ALT 14 04/24/2023 0800   ALT 20 11/29/2016 1054   ALT 13 12/29/2015 0954   BILITOT 0.4 04/24/2023 0800   BILITOT 0.71 12/29/2015 0954       Impression and Plan: Mr. Dubray is a pleasant 83 yo gentleman with original diagnosis of polycythemia vera and now early stage pancreatic cancer.  We will proceed with cycle 12 today as planned.  He goes on 5/13 to [facility] to have CT scans and speak with the surgeon.  Follow-up in 4 weeks with Dr. Maria Shiner.   Kennard Pea, NP 5/6/20258:51 AM

## 2023-05-08 NOTE — Patient Instructions (Signed)

## 2023-05-09 ENCOUNTER — Inpatient Hospital Stay: Admitting: Dietician

## 2023-05-09 LAB — CANCER ANTIGEN 19-9: CA 19-9: 41 U/mL — ABNORMAL HIGH (ref 0–35)

## 2023-05-09 NOTE — Progress Notes (Signed)
 Nutrition Follow-up: Called patient at home. Patient completed his last round of chemo and has a consult with surgeon next week.  He was recently seen by his endocrinologist to address elevated blood sugars and rare hypoglycemic events.  Insult regimens adjusted. He received the protein powder samples and has tried some but didn't have any preference for tolerability.  Has coupons as well to make more affordable.  He reports taste issues are still a "crap shoot with foods". breakfast "almost tasted good" (omelette with veggies) "Pizza wasn't horrible." Doing better with PO intake.  Blood sugars at last MD visit elevated due to regular Gatorade.   Patient has family in medical field to support him as well as very determined attitude for continued progress.  Medications: Insulin  routine changed, using CGM to adjust fast acting. Labs: 05/08/23 Glucose 258, Hgb 12.5  Anthropometrics: small fluctuations Height: 71' Weight:  05/08/23  152.4# 05/01/23  152.7# 04/24/23  153# 04/10/23  151# 03/28/23  153# UBW: 170# BMI: 21.26   Estimated Energy Needs   Kcals: 2135-2400 Protein: 73-92g Fluid: 2.4  NUTRITION DIAGNOSIS: Inadequate PO intake overall r/t dysgeusia to meet increased nutrient needs. Improving with increased weight and patient force feeding himself. Resolving with improved PO   INTERVENTION:   Encouraged MVI w/minerals to round out micronutrient while variety of food tolerance is limited. Encouraged continued use of ONS and protein powders per preference. Encouraged patient to reach out if he has concerns or questions in future.  MONITORING, EVALUATION, GOAL: weight trends, nutrition impact symptoms, PO intake, labs     Next Visit: PRN at patient or provider request   Carleen Chary, RDN, LDN Registered Dietitian, [facility] Part Time Remote (Usual office hours: Tuesday-Thursday) Mobile: [phone]

## 2023-05-10 ENCOUNTER — Inpatient Hospital Stay

## 2023-05-10 VITALS — BP 131/63 | HR 58 | Temp 98.6°F | Resp 18

## 2023-05-10 DIAGNOSIS — C252 Malignant neoplasm of tail of pancreas: Secondary | ICD-10-CM

## 2023-05-10 DIAGNOSIS — Z5111 Encounter for antineoplastic chemotherapy: Secondary | ICD-10-CM | POA: Diagnosis not present

## 2023-05-10 MED ORDER — SODIUM CHLORIDE 0.9% FLUSH
10.0000 mL | INTRAVENOUS | Status: DC | PRN
  Administered 2023-05-10: 10 mL

## 2023-05-10 MED ORDER — HEPARIN SOD (PORK) LOCK FLUSH 100 UNIT/ML IV SOLN
500.0000 [IU] | Freq: Once | INTRAVENOUS | Status: AC | PRN
  Administered 2023-05-10: 500 [IU]

## 2023-05-10 NOTE — Patient Instructions (Signed)

## 2023-05-15 ENCOUNTER — Encounter: Payer: Self-pay | Admitting: *Deleted

## 2023-05-15 ENCOUNTER — Other Ambulatory Visit: Payer: Self-pay | Admitting: Hematology & Oncology

## 2023-05-15 DIAGNOSIS — K8689 Other specified diseases of pancreas: Secondary | ICD-10-CM

## 2023-05-15 NOTE — Progress Notes (Unsigned)
 Patient was seen at [facility] for consideration of surgery. Unfortunately his repeat CT showed that his indeterminate pulmonary nodules had increased in size. He will need tissue biopsy to rule out metastatic disease prior to approval for surgery. They placed referral to pulmonary.   Will follow for scheduling, biopsy and path result.   Oncology Nurse Navigator Documentation     05/15/2023   12:45 PM  Oncology Nurse Navigator Flowsheets  Navigator Follow Up Date: 05/29/2023  Navigator Follow Up Reason: Review Note  Navigator Location [facility]  Navigator Encounter Type Appt/Treatment Plan Review  Patient Visit Type MedOnc  Treatment Phase Other  Barriers/Navigation Needs No Barriers At This Time  Interventions None Required  Acuity Level 1-No Barriers  Support Groups/Services Friends and Family  Time Spent with Patient 15

## 2023-05-16 ENCOUNTER — Encounter: Payer: Self-pay | Admitting: Hematology & Oncology

## 2023-05-29 ENCOUNTER — Encounter: Payer: Self-pay | Admitting: *Deleted

## 2023-05-29 NOTE — Progress Notes (Signed)
 Patient seen by pulmonary. They have requested a CT chest scheduled for 06/11/2023.  Oncology Nurse Navigator Documentation     05/29/2023   11:45 AM  Oncology Nurse Navigator Flowsheets  Navigator Follow Up Date: 06/11/2023  Navigator Follow Up Reason: Scan Review  Navigator Location [facility]  Navigator Encounter Type Appt/Treatment Plan Review  Patient Visit Type MedOnc  Treatment Phase Other  Barriers/Navigation Needs No Barriers At This Time  Interventions None Required  Acuity Level 1-No Barriers  Support Groups/Services Friends and Family  Time Spent with Patient 15

## 2023-05-30 ENCOUNTER — Encounter: Payer: Self-pay | Admitting: *Deleted

## 2023-06-05 ENCOUNTER — Inpatient Hospital Stay: Attending: Hematology & Oncology

## 2023-06-05 ENCOUNTER — Inpatient Hospital Stay (HOSPITAL_BASED_OUTPATIENT_CLINIC_OR_DEPARTMENT_OTHER): Admitting: Hematology & Oncology

## 2023-06-05 ENCOUNTER — Encounter: Payer: Self-pay | Admitting: Hematology & Oncology

## 2023-06-05 ENCOUNTER — Inpatient Hospital Stay

## 2023-06-05 VITALS — BP 138/73 | HR 61 | Temp 97.8°F | Resp 18 | Ht 71.0 in | Wt 155.0 lb

## 2023-06-05 DIAGNOSIS — D45 Polycythemia vera: Secondary | ICD-10-CM | POA: Diagnosis not present

## 2023-06-05 DIAGNOSIS — D509 Iron deficiency anemia, unspecified: Secondary | ICD-10-CM

## 2023-06-05 DIAGNOSIS — E119 Type 2 diabetes mellitus without complications: Secondary | ICD-10-CM | POA: Diagnosis not present

## 2023-06-05 DIAGNOSIS — R911 Solitary pulmonary nodule: Secondary | ICD-10-CM | POA: Diagnosis not present

## 2023-06-05 DIAGNOSIS — C259 Malignant neoplasm of pancreas, unspecified: Secondary | ICD-10-CM | POA: Diagnosis present

## 2023-06-05 DIAGNOSIS — C252 Malignant neoplasm of tail of pancreas: Secondary | ICD-10-CM

## 2023-06-05 DIAGNOSIS — C250 Malignant neoplasm of head of pancreas: Secondary | ICD-10-CM | POA: Diagnosis not present

## 2023-06-05 LAB — CBC WITH DIFFERENTIAL (CANCER CENTER ONLY)
Abs Immature Granulocytes: 0.03 10*3/uL (ref 0.00–0.07)
Basophils Absolute: 0 10*3/uL (ref 0.0–0.1)
Basophils Relative: 1 %
Eosinophils Absolute: 0.2 10*3/uL (ref 0.0–0.5)
Eosinophils Relative: 3 %
HCT: 35.5 % — ABNORMAL LOW (ref 39.0–52.0)
Hemoglobin: 11.9 g/dL — ABNORMAL LOW (ref 13.0–17.0)
Immature Granulocytes: 1 %
Lymphocytes Relative: 20 %
Lymphs Abs: 1.3 10*3/uL (ref 0.7–4.0)
MCH: 34.4 pg — ABNORMAL HIGH (ref 26.0–34.0)
MCHC: 33.5 g/dL (ref 30.0–36.0)
MCV: 102.6 fL — ABNORMAL HIGH (ref 80.0–100.0)
Monocytes Absolute: 1.1 10*3/uL — ABNORMAL HIGH (ref 0.1–1.0)
Monocytes Relative: 17 %
Neutro Abs: 3.7 10*3/uL (ref 1.7–7.7)
Neutrophils Relative %: 58 %
Platelet Count: 138 10*3/uL — ABNORMAL LOW (ref 150–400)
RBC: 3.46 MIL/uL — ABNORMAL LOW (ref 4.22–5.81)
RDW: 14.9 % (ref 11.5–15.5)
WBC Count: 6.3 10*3/uL (ref 4.0–10.5)
nRBC: 0 % (ref 0.0–0.2)

## 2023-06-05 LAB — CMP (CANCER CENTER ONLY)
ALT: 15 U/L (ref 0–44)
AST: 18 U/L (ref 15–41)
Albumin: 3.8 g/dL (ref 3.5–5.0)
Alkaline Phosphatase: 98 U/L (ref 38–126)
Anion gap: 8 (ref 5–15)
BUN: 19 mg/dL (ref 8–23)
CO2: 26 mmol/L (ref 22–32)
Calcium: 8.7 mg/dL — ABNORMAL LOW (ref 8.9–10.3)
Chloride: 105 mmol/L (ref 98–111)
Creatinine: 1.06 mg/dL (ref 0.61–1.24)
GFR, Estimated: >60 mL/min (ref >60)
Glucose, Bld: 226 mg/dL — ABNORMAL HIGH (ref 70–99)
Potassium: 4.2 mmol/L (ref 3.5–5.1)
Sodium: 139 mmol/L (ref 135–145)
Total Bilirubin: 0.6 mg/dL (ref 0.0–1.2)
Total Protein: 6.2 g/dL — ABNORMAL LOW (ref 6.5–8.1)

## 2023-06-05 LAB — FERRITIN: Ferritin: 91 ng/mL (ref 24–336)

## 2023-06-05 LAB — IRON AND IRON BINDING CAPACITY (CC-WL,HP ONLY)
Iron: 78 ug/dL (ref 45–182)
Saturation Ratios: 26 % (ref 17.9–39.5)
TIBC: 298 ug/dL (ref 250–450)
UIBC: 220 ug/dL (ref 117–376)

## 2023-06-05 NOTE — Progress Notes (Signed)
 Hematology and Oncology Follow Up Visit  Alexander [name] [id number] 03-11-1940 83 y.o. 06/05/2023   Principle Diagnosis:  Adenocarcinoma of the pancreas-stage Ib (T2N0M0) --clinical stage --BRCA (-), KRAS (+), HRD(-)   [name] Therapy:        Status post biliary stent placement FOLFIRINOX -- Neoadj - s/p cycle #12/12  - start on 10/31/2022 --completed on 05/08/2023   Interim History:  Alexander [name] is here today for follow-up.  He has completed all of his neoadjuvant chemotherapy.  Unfortunately, there may be a problem.  He was seen out of [facility].  He was found on a chest CT scan to have a right lower lobe pulmonary nodule.  I think this has been there in the past.  The surgeon wants to have this biopsy before he thinks about doing a Whipple procedure.  The CA 19-9 is a 41.  Alexander [name] feels great.  He is exercising.  Unfortunate, he cannot swim because the heater in his pool is broken.  His appetite is good.  He is try to get his taste back.  He does have diabetes.  He is trying to watch his blood sugar.  He has had no problems with his bowels or bladder.  He has had no bleeding.  Has had no leg swelling.  There is been no problems with pain.  Overall, I would say that his performance status is probably ECOG 1.    Wt Readings from Last 3 Encounters:  06/05/23 155 lb (70.3 kg)  05/08/23 152 lb 6.4 oz (69.1 kg)  05/01/23 152 lb 12.8 oz (69.3 kg)   Medications:  Allergies as of 06/05/2023       Reactions   Metformin  And Related Diarrhea   Metformin  Hcl Diarrhea        Medication List        Accurate as of June 05, 2023 10:04 AM. If you have any questions, ask your nurse or doctor.          Accu-Chek Softclix Lancets lancets Use to test blood glucose once daily   amLODipine  10 MG tablet Commonly known as: NORVASC  Take 1 tablet (10 mg total) by mouth daily.   amoxicillin  500 MG capsule Commonly known as: AMOXIL  Take four capsules (2000 mg) one hour  prior to dental procedure.   Creon  36000-114000 units Cpep capsule Generic drug: lipase/protease/amylase TAKE TWO CAPSULES BY MOUTH THREE TIMES A DAY WITH MEALS. ALSO MAY TAKE ONE CAPSULE AS NEEDED WITH SNACKS (UP TO 4 SNACKS PER DAY)   Droplet Pen Needles 31G X 6 MM Misc Generic drug: Insulin  Pen Needle Use to check blood sugar TID   BD Pen Needle Nano 2nd Gen 32G X 4 MM Misc Generic drug: Insulin  Pen Needle   Embrace Pro Glucose Test test strip Generic drug: glucose blood Use as instructed   FreeStyle Libre 3 Plus Sensor Misc Change sensor every 15 days.   Gvoke HypoPen  1-Pack 1 MG/0.2ML Soaj Generic drug: Glucagon  Inject 1 mg into the skin as needed (low blood sugar with impaired consciousness).   insulin  lispro 100 UNIT/ML KwikPen Commonly known as: HumaLOG  KwikPen 2-3 units with meals 3 times a day plus sliding scale as instructed, maximum 20 units / day.   INSULIN  SYRINGE .3CC/30GX5/16" 30G X 5/16" 0.3 ML Misc Use for insulin  administration three times a day   Lantus  SoloStar 100 UNIT/ML Solostar Pen Generic drug: insulin  glargine INJECT 20 UNITS INTO THE SKIN 2 TIMES A DAY What changed: additional instructions  loperamide  2 MG capsule Commonly known as: IMODIUM  Take 2 tabs by mouth with first loose stool, then 1 tab with each additional loose stool as needed. Do not exceed 8 tabs in a 24-hour period   NovoLOG  FlexPen 100 UNIT/ML FlexPen Generic drug: insulin  aspart 2-3 units with meals 3 times a day plus sliding scale as instructed, maximum 20units/day   OLANZapine  5 MG tablet Commonly known as: ZYPREXA  Take 1 tablet (5 mg total) by mouth at bedtime.   ondansetron  8 MG tablet Commonly known as: Zofran  Take 1 tablet (8 mg total) by mouth every 8 (eight) hours as needed for nausea or vomiting. Start on the third day after chemotherapy   pantoprazole  40 MG tablet Commonly known as: PROTONIX  Take 1 tablet (40 mg total) by mouth 2 (two) times daily before a  meal.   prochlorperazine  10 MG tablet Commonly known as: COMPAZINE  Take 1 tablet (10 mg total) by mouth every 6 (six) hours as needed for nausea or vomiting (Nausea or vomiting).   saccharomyces boulardii 250 MG capsule Commonly known as: FLORASTOR Take 250 mg by mouth 2 (two) times daily.   VITAMIN D (CHOLECALCIFEROL) PO Take by mouth 2 (two) times daily.        Allergies:  Allergies  Allergen Reactions   Metformin  And Related Diarrhea   Metformin  Hcl Diarrhea    Past Medical History, Surgical history, Social history, and Family History were reviewed and updated.  Review of Systems: Review of Systems  Constitutional: Negative.   HENT: Negative.    Eyes: Negative.   Respiratory: Negative.    Cardiovascular: Negative.   Gastrointestinal: Negative.   Genitourinary: Negative.   Musculoskeletal: Negative.   Skin: Negative.   Neurological: Negative.   Endo/Heme/Allergies: Negative.   Psychiatric/Behavioral: Negative.       Physical Exam:  height is 5\' 11"  (1.803 m) and weight is 155 lb (70.3 kg). His oral temperature is 97.8 F (36.6 C). His blood pressure is 138/73 and his pulse is 61. His respiration is 18 and oxygen saturation is 99%.   Wt Readings from Last 3 Encounters:  06/05/23 155 lb (70.3 kg)  05/08/23 152 lb 6.4 oz (69.1 kg)  05/01/23 152 lb 12.8 oz (69.3 kg)    Physical Exam Vitals reviewed.  HENT:     Head: Normocephalic and atraumatic.  Eyes:     Pupils: Pupils are equal, round, and reactive to light.  Cardiovascular:     Rate and Rhythm: Normal rate and regular rhythm.     Heart sounds: Normal heart sounds.  Pulmonary:     Effort: Pulmonary effort is normal.     Breath sounds: Normal breath sounds.  Abdominal:     General: Bowel sounds are normal.     Palpations: Abdomen is soft.  Musculoskeletal:        General: No tenderness or deformity. Normal range of motion.     Cervical back: Normal range of motion.  Lymphadenopathy:      Cervical: No cervical adenopathy.  Skin:    General: Skin is warm and dry.     Findings: No erythema or rash.  Neurological:     Mental Status: He is alert and oriented to person, place, and time.  Psychiatric:        Behavior: Behavior normal.        Thought Content: Thought content normal.        Judgment: Judgment normal.     Lab Results  Component Value Date  WBC 6.3 06/05/2023   HGB 11.9 (L) 06/05/2023   HCT 35.5 (L) 06/05/2023   MCV 102.6 (H) 06/05/2023   PLT 138 (L) 06/05/2023   Lab Results  Component Value Date   FERRITIN 160 05/08/2023   IRON 67 05/08/2023   TIBC 297 05/08/2023   UIBC 230 05/08/2023   IRONPCTSAT 23 05/08/2023   Lab Results  Component Value Date   RETICCTPCT 0.7 08/30/2022   RBC 3.46 (L) 06/05/2023   RETICCTABS 32.6 12/19/2013   No results found for: "KPAFRELGTCHN", "LAMBDASER", "KAPLAMBRATIO" No results found for: "IGGSERUM", "IGA", "IGMSERUM" No results found for: "TOTALPROTELP", "ALBUMINELP", "A1GS", "A2GS", "BETS", "BETA2SER", "GAMS", "MSPIKE", "SPEI"   Chemistry      Component Value Date/Time   NA 139 06/05/2023 0756   NA 144 11/29/2016 1054   NA 139 12/29/2015 0954   K 4.2 06/05/2023 0756   K 4.2 11/29/2016 1054   K 4.1 12/29/2015 0954   CL 105 06/05/2023 0756   CL 102 11/29/2016 1054   CO2 26 06/05/2023 0756   CO2 30 11/29/2016 1054   CO2 26 12/29/2015 0954   BUN 19 06/05/2023 0756   BUN 22 11/29/2016 1054   BUN 20.0 12/29/2015 0954   CREATININE 1.06 06/05/2023 0756   CREATININE 1.2 11/29/2016 1054   CREATININE 1.3 12/29/2015 0954      Component Value Date/Time   CALCIUM  8.7 (L) 06/05/2023 0756   CALCIUM  9.3 11/29/2016 1054   CALCIUM  9.5 12/29/2015 0954   ALKPHOS 98 06/05/2023 0756   ALKPHOS 92 (H) 11/29/2016 1054   ALKPHOS 98 12/29/2015 0954   AST 18 06/05/2023 0756   AST 16 12/29/2015 0954   ALT 15 06/05/2023 0756   ALT 20 11/29/2016 1054   ALT 13 12/29/2015 0954   BILITOT 0.6 06/05/2023 0756   BILITOT 0.71  12/29/2015 0954       Impression and Plan: Alexander [name] is a pleasant 83 yo gentleman with original diagnosis of  polycythemia vera and now early stage pancreatic cancer.   Hopefully, he will be able to have the Whipple procedure.  He is going to undergo a bronchoscopy on 06/13/2023.  We will have to see what the path report shows.  I think if the biopsy is positive in the lung nodule, I I would not imagine that he would have a Whipple procedure.  I would consider him as oligometastatic disease.  I would consider him for radiosurgery.  I would then think about doing chemoradiation therapy for the pancreatic primary.  As far as having to come back to see us , I am not sure when we will be able to get him back.  This is can be dependent upon his biopsy and whether or not he has the Whipple procedure.  I will plan for follow-up in about 6 weeks just to have him on the schedule.   Ivor Mars, MD 6/3/202510:04 AM

## 2023-06-05 NOTE — Patient Instructions (Signed)

## 2023-06-06 ENCOUNTER — Encounter: Payer: Self-pay | Admitting: *Deleted

## 2023-06-06 ENCOUNTER — Ambulatory Visit: Payer: Self-pay | Admitting: Hematology & Oncology

## 2023-06-06 LAB — CANCER ANTIGEN 19-9: CA 19-9: 33 U/mL (ref 0–35)

## 2023-06-07 ENCOUNTER — Inpatient Hospital Stay

## 2023-06-12 ENCOUNTER — Encounter: Payer: Self-pay | Admitting: *Deleted

## 2023-06-12 NOTE — Progress Notes (Signed)
 Reviewed CT. Bronch planned for 06/14/2023.  Oncology Nurse Navigator Documentation     06/12/2023    8:30 AM  Oncology Nurse Navigator Flowsheets  Navigator Follow Up Date: 06/14/2023  Navigator Follow Up Reason: Surgery  Navigator Location [facility]  Navigator Encounter Type Scan Review  Patient Visit Type MedOnc  Treatment Phase Other  Barriers/Navigation Needs No Barriers At This Time  Interventions None Required  Acuity Level 1-No Barriers  Support Groups/Services Friends and Family  Time Spent with Patient 15

## 2023-06-14 ENCOUNTER — Encounter: Payer: Self-pay | Admitting: *Deleted

## 2023-06-14 NOTE — Progress Notes (Signed)
 Patient had a technically successful bronch at [facility] today. Will follow for path results.   Oncology Nurse Navigator Documentation     06/14/2023    1:00 PM  Oncology Nurse Navigator Flowsheets  Navigator Follow Up Date: 06/18/2023  Navigator Follow Up Reason: Pathology  Navigator Location [facility]  Navigator Encounter Type Appt/Treatment Plan Review  Patient Visit Type MedOnc  Treatment Phase Other  Barriers/Navigation Needs No Barriers At This Time  Interventions None Required  Acuity Level 1-No Barriers  Support Groups/Services Friends and Family  Time Spent with Patient 15

## 2023-07-02 ENCOUNTER — Encounter: Payer: Self-pay | Admitting: *Deleted

## 2023-07-02 NOTE — Progress Notes (Signed)
 Patient's biopsy inconclusive to whether or not his lung nodules are malignant. He will have a follow up appointment with his surgical team on 07/09/2023.  Oncology Nurse Navigator Documentation     07/02/2023    2:15 PM  Oncology Nurse Navigator Flowsheets  Navigator Follow Up Date: 07/09/2023  Navigator Follow Up Reason: Review Note  Navigator Location [facility]  Navigator Encounter Type Pathology Review  Patient Visit Type MedOnc  Treatment Phase Other  Barriers/Navigation Needs No Barriers At This Time  Interventions None Required  Acuity Level 1-No Barriers  Support Groups/Services Friends and Family  Time Spent with Patient 15

## 2023-07-10 ENCOUNTER — Encounter: Payer: Self-pay | Admitting: *Deleted

## 2023-07-10 NOTE — Progress Notes (Signed)
 Patient was seen at [facility] yesterday with the following plan made:  Discussed that his last chemotherapy was on 5/6 and with him being borderline resectable, would recommend restaging scans now and if still borderline, would recommend thoracic surgery referral for consideration of VATs procedure. Obtained repeat CMP and CA 19-9 today. If he remains not a surgical candidate, discussed that palliative indefinite chemotherapy for disease control would his option, potentially with or without radiation, clinical trial, or best supportive care, pending local oncologist recommendations.   CT's are scheduled for this afternoon.   Oncology Nurse Navigator Documentation     07/10/2023    8:30 AM  Oncology Nurse Navigator Flowsheets  Navigator Follow Up Date: 07/11/2023  Navigator Follow Up Reason: Scan Review  Navigator Location [facility]  Navigator Encounter Type Appt/Treatment Plan Review  Patient Visit Type MedOnc  Treatment Phase Other  Barriers/Navigation Needs No Barriers At This Time  Interventions None Required  Acuity Level 1-No Barriers  Support Groups/Services Friends and Family  Time Spent with Patient 15

## 2023-07-11 ENCOUNTER — Encounter: Payer: Self-pay | Admitting: *Deleted

## 2023-07-11 NOTE — Progress Notes (Signed)
 CT showed stable to slightly enlarge pulmonary nodules. CA 19-9 is rising, now at 58. [facility] team will determine next steps.   Oncology Nurse Navigator Documentation     07/11/2023    8:30 AM  Oncology Nurse Navigator Flowsheets  Navigator Follow Up Date: 07/17/2023  Navigator Follow Up Reason: Follow-up Appointment  Navigator Location [facility]  Navigator Encounter Type Scan Review  Patient Visit Type MedOnc  Treatment Phase Other  Barriers/Navigation Needs No Barriers At This Time  Interventions None Required  Acuity Level 1-No Barriers  Support Groups/Services Friends and Family  Time Spent with Patient 15

## 2023-07-17 ENCOUNTER — Inpatient Hospital Stay: Attending: Hematology & Oncology

## 2023-07-17 ENCOUNTER — Other Ambulatory Visit

## 2023-07-17 ENCOUNTER — Inpatient Hospital Stay (HOSPITAL_BASED_OUTPATIENT_CLINIC_OR_DEPARTMENT_OTHER): Admitting: Family

## 2023-07-17 ENCOUNTER — Inpatient Hospital Stay

## 2023-07-17 ENCOUNTER — Encounter: Payer: Self-pay | Admitting: Family

## 2023-07-17 ENCOUNTER — Encounter: Payer: Self-pay | Admitting: *Deleted

## 2023-07-17 ENCOUNTER — Ambulatory Visit: Admitting: Hematology & Oncology

## 2023-07-17 VITALS — BP 150/69 | HR 57 | Temp 97.8°F | Resp 18 | Ht 71.0 in | Wt 155.5 lb

## 2023-07-17 DIAGNOSIS — C250 Malignant neoplasm of head of pancreas: Secondary | ICD-10-CM | POA: Diagnosis not present

## 2023-07-17 DIAGNOSIS — D45 Polycythemia vera: Secondary | ICD-10-CM | POA: Diagnosis present

## 2023-07-17 LAB — CMP (CANCER CENTER ONLY)
ALT: 14 U/L (ref 0–44)
AST: 14 U/L — ABNORMAL LOW (ref 15–41)
Albumin: 3.8 g/dL (ref 3.5–5.0)
Alkaline Phosphatase: 138 U/L — ABNORMAL HIGH (ref 38–126)
Anion gap: 8 (ref 5–15)
BUN: 20 mg/dL (ref 8–23)
CO2: 26 mmol/L (ref 22–32)
Calcium: 9.3 mg/dL (ref 8.9–10.3)
Chloride: 107 mmol/L (ref 98–111)
Creatinine: 1.27 mg/dL — ABNORMAL HIGH (ref 0.61–1.24)
GFR, Estimated: 56 mL/min — ABNORMAL LOW (ref >60)
Glucose, Bld: 163 mg/dL — ABNORMAL HIGH (ref 70–99)
Potassium: 4 mmol/L (ref 3.5–5.1)
Sodium: 141 mmol/L (ref 135–145)
Total Bilirubin: 0.5 mg/dL (ref 0.0–1.2)
Total Protein: 6.6 g/dL (ref 6.5–8.1)

## 2023-07-17 LAB — CBC WITH DIFFERENTIAL (CANCER CENTER ONLY)
Abs Immature Granulocytes: 0.03 K/uL (ref 0.00–0.07)
Basophils Absolute: 0.1 K/uL (ref 0.0–0.1)
Basophils Relative: 1 %
Eosinophils Absolute: 0.2 K/uL (ref 0.0–0.5)
Eosinophils Relative: 2 %
HCT: 39 % (ref 39.0–52.0)
Hemoglobin: 12.9 g/dL — ABNORMAL LOW (ref 13.0–17.0)
Immature Granulocytes: 0 %
Lymphocytes Relative: 17 %
Lymphs Abs: 1.6 K/uL (ref 0.7–4.0)
MCH: 32.7 pg (ref 26.0–34.0)
MCHC: 33.1 g/dL (ref 30.0–36.0)
MCV: 98.7 fL (ref 80.0–100.0)
Monocytes Absolute: 1.1 K/uL — ABNORMAL HIGH (ref 0.1–1.0)
Monocytes Relative: 12 %
Neutro Abs: 6.1 K/uL (ref 1.7–7.7)
Neutrophils Relative %: 68 %
Platelet Count: 196 K/uL (ref 150–400)
RBC: 3.95 MIL/uL — ABNORMAL LOW (ref 4.22–5.81)
RDW: 12.9 % (ref 11.5–15.5)
WBC Count: 9 K/uL (ref 4.0–10.5)
nRBC: 0 % (ref 0.0–0.2)

## 2023-07-17 MED ORDER — SODIUM CHLORIDE 0.9% FLUSH
10.0000 mL | Freq: Once | INTRAVENOUS | Status: AC
  Administered 2023-07-17: 10 mL

## 2023-07-17 MED ORDER — HEPARIN SOD (PORK) LOCK FLUSH 100 UNIT/ML IV SOLN
500.0000 [IU] | Freq: Once | INTRAVENOUS | Status: AC
  Administered 2023-07-17: 500 [IU] via INTRAVENOUS

## 2023-07-17 NOTE — Progress Notes (Signed)
 Spoke with patient prior to his provider appointment. He's doing well and feeling well. He's excited that his tase is starting to return. He has a follow up with a surgeon next week to discuss whether or not he will proceed with a lung biopsy, for whipple clearance, or return to us  for maintenance/observation.   Oncology Nurse Navigator Documentation     07/17/2023    9:30 AM  Oncology Nurse Navigator Flowsheets  Navigator Follow Up Date: 07/24/2023  Navigator Follow Up Reason: Review Note  Navigator Location [facility]  Navigator Encounter Type Treatment  Patient Visit Type MedOnc  Treatment Phase Other  Barriers/Navigation Needs No Barriers At This Time  Interventions Psycho-Social Support  Acuity Level 1-No Barriers  Support Groups/Services Friends and Family  Time Spent with Patient 15

## 2023-07-17 NOTE — Progress Notes (Signed)
 Hematology and Oncology Follow Up Visit  Alexander [name] [id number] 01/21/1940 83 y.o. 07/17/2023   Principle Diagnosis:  Adenocarcinoma of the pancreas-stage Ib (T2N0M0) --clinical stage --BRCA (-), KRAS (+), HRD(-)   Current Therapy:        Status post biliary stent placement FOLFIRINOX -- Neoadj - s/p cycle #12/12  - start on 10/31/2022 --completed on 05/08/2023   Interim History:  Mr. Amick is here today for follow-up. He had repeat scans with [facility] which showed stable to slightly enlarged pulmonary nodules. Bronch had been inconclusive. He has an appointment with them next week to discuss next steps.  He states that he feels great. He is still swimming every day.  No issues with fatigue.  No fever, chills, n/v, cough, rash, dizziness, SOB, chest pain, palpitations, abdominal pain or changes in bowel or bladder habits.  No swelling or tenderness in his extremities.  Neuropathy in his fingertips unchanged from baseline.  No falls or syncope reported.  Appetite and hydration ae good. Weight is stable at 155 lbs.   ECOG Performance Status: 1 - Symptomatic but completely ambulatory  Medications:  Allergies as of 07/17/2023       Reactions   Metformin  And Related Diarrhea   Metformin  Hcl Diarrhea        Medication List        Accurate as of July 17, 2023  9:31 AM. If you have any questions, ask your nurse or doctor.          Accu-Chek Softclix Lancets lancets Use to test blood glucose once daily   amLODipine  10 MG tablet Commonly known as: NORVASC  Take 1 tablet (10 mg total) by mouth daily.   amoxicillin  500 MG capsule Commonly known as: AMOXIL  Take four capsules (2000 mg) one hour prior to dental procedure.   Creon  36000-114000 units Cpep capsule Generic drug: lipase/protease/amylase TAKE TWO CAPSULES BY MOUTH THREE TIMES A DAY WITH MEALS. ALSO MAY TAKE ONE CAPSULE AS NEEDED WITH SNACKS (UP TO 4 SNACKS PER DAY)   Droplet Pen Needles 31G X 6 MM  Misc Generic drug: Insulin  Pen Needle Use to check blood sugar TID   BD Pen Needle Nano 2nd Gen 32G X 4 MM Misc Generic drug: Insulin  Pen Needle   Embrace Pro Glucose Test test strip Generic drug: glucose blood Use as instructed   FreeStyle Libre 3 Plus Sensor Misc Change sensor every 15 days.   Gvoke HypoPen  1-Pack 1 MG/0.2ML Soaj Generic drug: Glucagon  Inject 1 mg into the skin as needed (low blood sugar with impaired consciousness).   insulin  lispro 100 UNIT/ML KwikPen Commonly known as: HumaLOG  KwikPen 2-3 units with meals 3 times a day plus sliding scale as instructed, maximum 20 units / day.   INSULIN  SYRINGE .3CC/30GX5/16 30G X 5/16 0.3 ML Misc Use for insulin  administration three times a day   Lantus  SoloStar 100 UNIT/ML Solostar Pen Generic drug: insulin  glargine INJECT 20 UNITS INTO THE SKIN 2 TIMES A DAY   loperamide  2 MG capsule Commonly known as: IMODIUM  Take 2 tabs by mouth with first loose stool, then 1 tab with each additional loose stool as needed. Do not exceed 8 tabs in a 24-hour period   NovoLOG  FlexPen 100 UNIT/ML FlexPen Generic drug: insulin  aspart 2-3 units with meals 3 times a day plus sliding scale as instructed, maximum 20units/day   OLANZapine  5 MG tablet Commonly known as: ZYPREXA  Take 1 tablet (5 mg total) by mouth at bedtime.   ondansetron  8 MG tablet  Commonly known as: Zofran  Take 1 tablet (8 mg total) by mouth every 8 (eight) hours as needed for nausea or vomiting. Start on the third day after chemotherapy   pantoprazole  40 MG tablet Commonly known as: PROTONIX  Take 1 tablet (40 mg total) by mouth 2 (two) times daily before a meal.   prochlorperazine  10 MG tablet Commonly known as: COMPAZINE  Take 1 tablet (10 mg total) by mouth every 6 (six) hours as needed for nausea or vomiting (Nausea or vomiting).   saccharomyces boulardii 250 MG capsule Commonly known as: FLORASTOR Take 250 mg by mouth 2 (two) times daily.   VITAMIN D  (CHOLECALCIFEROL) PO Take by mouth 2 (two) times daily.        Allergies:  Allergies  Allergen Reactions   Metformin  And Related Diarrhea   Metformin  Hcl Diarrhea    Past Medical History, Surgical history, Social history, and Family History were reviewed and updated.  Review of Systems: All other 10 point review of systems is negative.   Physical Exam:  height is 5' 11 (1.803 m) and weight is 155 lb 8 oz (70.5 kg). His oral temperature is 97.8 F (36.6 C). His blood pressure is 150/69 (abnormal) and his pulse is 57 (abnormal). His respiration is 18 and oxygen saturation is 100%.   Wt Readings from Last 3 Encounters:  07/17/23 155 lb 8 oz (70.5 kg)  06/05/23 155 lb (70.3 kg)  05/08/23 152 lb 6.4 oz (69.1 kg)    Ocular: Sclerae unicteric, pupils equal, round and reactive to light Ear-nose-throat: Oropharynx clear, dentition fair Lymphatic: No cervical or supraclavicular adenopathy Lungs no rales or rhonchi, good excursion bilaterally Heart regular rate and rhythm, no murmur appreciated Abd soft, nontender, positive bowel sounds MSK no focal spinal tenderness, no joint edema Neuro: non-focal, well-oriented, appropriate affect Breasts: Deferred   Lab Results  Component Value Date   WBC 9.0 07/17/2023   HGB 12.9 (L) 07/17/2023   HCT 39.0 07/17/2023   MCV 98.7 07/17/2023   PLT 196 07/17/2023   Lab Results  Component Value Date   FERRITIN 91 06/05/2023   IRON 78 06/05/2023   TIBC 298 06/05/2023   UIBC 220 06/05/2023   IRONPCTSAT 26 06/05/2023   Lab Results  Component Value Date   RETICCTPCT 0.7 08/30/2022   RBC 3.95 (L) 07/17/2023   RETICCTABS 32.6 12/19/2013   No results found for: KPAFRELGTCHN, LAMBDASER, KAPLAMBRATIO No results found for: IGGSERUM, IGA, IGMSERUM No results found for: STEPHANY CARLOTA BENSON MARKEL EARLA JOANNIE DOC VICK, SPEI   Chemistry      Component Value Date/Time   NA 139 06/05/2023 0756    NA 144 11/29/2016 1054   NA 139 12/29/2015 0954   K 4.2 06/05/2023 0756   K 4.2 11/29/2016 1054   K 4.1 12/29/2015 0954   CL 105 06/05/2023 0756   CL 102 11/29/2016 1054   CO2 26 06/05/2023 0756   CO2 30 11/29/2016 1054   CO2 26 12/29/2015 0954   BUN 19 06/05/2023 0756   BUN 22 11/29/2016 1054   BUN 20.0 12/29/2015 0954   CREATININE 1.06 06/05/2023 0756   CREATININE 1.2 11/29/2016 1054   CREATININE 1.3 12/29/2015 0954      Component Value Date/Time   CALCIUM  8.7 (L) 06/05/2023 0756   CALCIUM  9.3 11/29/2016 1054   CALCIUM  9.5 12/29/2015 0954   ALKPHOS 98 06/05/2023 0756   ALKPHOS 92 (H) 11/29/2016 1054   ALKPHOS 98 12/29/2015 0954   AST 18 06/05/2023 0756  AST 16 12/29/2015 0954   ALT 15 06/05/2023 0756   ALT 20 11/29/2016 1054   ALT 13 12/29/2015 0954   BILITOT 0.6 06/05/2023 0756   BILITOT 0.71 12/29/2015 0954       Impression and Plan:  Mr. Yankee is a pleasant 83 yo gentleman with original diagnosis of  polycythemia vera and now early stage pancreatic cancer.  CA 19-9 was 58 last week.  [facility] will discuss surgical options with him next week.  We will plan to see him back in a month.     Lauraine Pepper, NP 7/15/20259:31 AM

## 2023-07-17 NOTE — Patient Instructions (Signed)

## 2023-07-18 ENCOUNTER — Other Ambulatory Visit: Payer: Self-pay

## 2023-07-18 LAB — CANCER ANTIGEN 19-9: CA 19-9: 52 U/mL — ABNORMAL HIGH (ref 0–35)

## 2023-07-24 ENCOUNTER — Encounter: Payer: Self-pay | Admitting: Endocrinology

## 2023-07-25 ENCOUNTER — Encounter: Payer: Self-pay | Admitting: *Deleted

## 2023-07-25 NOTE — Progress Notes (Signed)
 Patient will proceed with the following surgery on 8/1/12025:  THORACOSCOPY, SURGICAL; WITH THERAPEUTIC WEDGE RESECTION (EG, MASS, NODULE) INITIAL UNILATERAL   Will follow for path.  Oncology Nurse Navigator Documentation     07/25/2023    9:00 AM  Oncology Nurse Navigator Flowsheets  Navigator Follow Up Date: 08/03/2023  Navigator Follow Up Reason: Surgery  Navigator Location [facility]  Navigator Encounter Type Appt/Treatment Plan Review  Patient Visit Type MedOnc  Barriers/Navigation Needs No Barriers At This Time  Interventions None Required  Acuity Level 1-No Barriers  Support Groups/Services Friends and Family  Time Spent with Patient 15

## 2023-07-30 ENCOUNTER — Other Ambulatory Visit: Payer: Self-pay

## 2023-08-04 NOTE — Discharge Summary (Signed)
 ------------------------------------------------------------------------------- Attestation signed by Darra Selinda Sharper, MD at 08/06/23 [phone] I saw and evaluated the patient, participating in the key portions of the service on the day of discharge.  I reviewed the resident's note and agree with the discharge plans and disposition. I personally spent 5 minutes in discharge planning services. Selinda CHRISTELLA Darra, MD -------------------------------------------------------------------------------   Discharge Summary  Admit date: 08/03/2023  Discharge date and time: 08/04/2023  Discharge to:  Home  Discharge Service: Surg Thoracic (SRT)  Discharge Attending Physician: Selinda Sharper Darra, MD  Discharge  Diagnoses: s/p right video assisted thorascopic surgery therapeutic wedge resection  Secondary Diagnosis: Principal Problem:   Lung nodules (POA: Unknown) Resolved Problems:   * No resolved hospital problems. *   OR Procedures:   Right - THORACOSCOPY; WITH DIAGNOSTIC BIOPSY(IES) OF LUNG NODULE(S) OR MASS(ES)(EG, WEDGE, INCISIONAL), UNILATERAL Date 08/03/2023 -------------------   Ancillary Procedures: no procedures  Discharge Day Services:   Subjective  No acute events overnight.  Objective  Patient Vitals for the past 8 hrs:  BP Temp Temp src Pulse Resp SpO2  08/04/23 1206 148/93 36.5 C (97.7 F) Oral 69 18 98 %   I/O this shift: In: 2.2 [I.V.:2.2] Out: 60 [Chest Tube:60]  General Appearance:   No acute distress Lungs:                Clear to auscultation bilaterally Heart:                           Regular rate and rhythm Abdomen:                Soft, non-tender, non-distended Extremities:              Warm and well perfused  Hospital Course:  Mr. Barnet Benavides is a 83 y.o. male who presents for evaluation and management of three right lower lobe nodules in consultation at the request of Dr. WILKIE Cramp. He has a history of cT2N0 borderline resectable pancreatic adenocarcinoma  s/p12 cycles of neoadjuvant FOLFIRINOX (10/31/2022 - 05/10/2023). He has tolerated chemotherapy well without nausea, vomiting, diarrhea, malaise.  His weight loss has stopped and he has started to gain some weight after the addition of creon . The patient is following Dr. Cramp for his pancreatic cancer.  In terms of his right lower lobe nodules that were first noted on the patient CT scan of the chest on 10/04/2022.  Sequentially, the patient had another CT scan of the chest on 01/29/2023 which revealed slight increase in tumor size.  Another scan was performed on 05/14/2023 which again showed increased in the size of the nodules.  In that context, the patient was referred to interventional pulmonology and had ION navigational bronchoscopy and biopsy of the lateral and medial nodules.  The biopsy was overall inconclusive however some atypical mucinous cells were identified on the FNA performed on the lateral inferior right lower lobe.   He had a right video-assisted thorascopic surgery therapeutic wedge resection on 08/03/2023 and tolerated the procedure well. He had his chest tube removed on POD1. Post-operatively, the patient's pain is well tolerated, is tolerating a regular diet, is ambulating, and is voiding spontaneously.   There will be a 2-week post-op visit with Dr. Darra and you will be reached out to in regards to scheduling.    Condition at Discharge: Improved Discharge Medications:    Medication List    START taking these medications   . oxyCODONE  5 MG immediate  release tablet; Commonly known as: ROXICODONE ;  Take 1 tablet (5 mg total) by mouth every four (4) hours as needed for up  to 5 days.   CONTINUE taking these medications   . amlodipine  10 MG tablet; Commonly known as: NORVASC  . LANTUS  SOLOSTAR U-100 INSULIN  100 unit/mL (3 mL) injection pen; Generic  drug: insulin  glargine . lipase-protease-amylase 36,000-114,000- 180,000 unit Cpdr; Commonly  known as: CREON  . NANO 2ND GEN PEN NEEDLE  32 gauge x 5/32 Ndle; Generic drug: pen needle,  diabetic . NovoLOG  Flexpen U-100 Insulin  100 unit/mL (3 mL) injection pen; Generic  drug: insulin  aspart . pantoprazole  40 MG tablet; Commonly known as: Protonix  . Saccharomyces boulardii 250 mg capsule; Commonly known as: FLORASTOR . sodium fluoride-pot nitrate 1.1-5 % Pste . VITAMIN D3 125 mcg (5,000 unit) tablet; Generic drug: cholecalciferol  (vitamin D3-125 mcg (5,000 unit))   STOP taking these medications   . PROCTO-MED HC  2.5 % rectal cream; Generic drug: hydrocortisone     Pending Test Results:   Discharge Instructions:  Other Instructions     Discharge instructions     1) Dial  911 for emergencies.   2) For emergencies after-hours: call the [facility] operator [phone]) to page the General Surgery resident on call (your question will be directed to a surgery resident who is not immediately aware of the details of your case, but can help you deal with any emergencies that cannot wait until regular business hours).  3) You may shower 48 hours after surgery, do not scrub surgical sites. Any sutures or staples will be removed in clinic during your next follow up visit.   4) You can take Tylenol  650-1000mg  every 6-8 hours as needed for pain. If your pain is not controlled, you have been prescribed a narcotic pain medication if necessary. Do not drive while taking narcotic pain medications.  5)  Some of your home medications may have CHANGED, please review list carefully. Resume home medications as listed under discharge medications.  You will have a follow up in 2 weeks with [facility] Thoracic Surgery along with a Chest Xray.      Labs and Other Follow-ups after Discharge: Follow Up instructions and Outpatient Referrals    Discharge instructions       Future Appointments:

## 2023-08-06 ENCOUNTER — Telehealth: Payer: Self-pay

## 2023-08-06 ENCOUNTER — Encounter: Payer: Self-pay | Admitting: Hematology & Oncology

## 2023-08-06 NOTE — Transitions of Care (Post Inpatient/ED Visit) (Signed)
   08/06/2023  Name: Alexander [name] MRN: [id number] DOB: Apr 12, 1940  Today's TOC FU Call Status: Today's TOC FU Call Status:: Unsuccessful Call (1st Attempt) Unsuccessful Call (1st Attempt) Date: 08/06/23  Attempted to reach the patient regarding the most recent Inpatient/ED visit. Patient reports that he is at the store and unable to talk. States to call back later. States I am doing well.  Follow Up Plan: Additional outreach attempts will be made to reach the patient to complete the Transitions of Care (Post Inpatient/ED visit) call.   Alan Ee, RN, BSN, CEN [employer]- Transition of Care Team.  [facility] [phone]

## 2023-08-07 ENCOUNTER — Encounter: Payer: Self-pay | Admitting: *Deleted

## 2023-08-07 NOTE — Progress Notes (Signed)
 Received notification that patient's lung biopsy was positive for pancreatic mets. This is a preliminary report. Reviewed with Dr Timmy. When final report is released, he would like [facility] studies completed on the biopsy.  Oncology Nurse Navigator Documentation     08/07/2023    1:15 PM  Oncology Nurse Navigator Flowsheets  Navigator Location [facility]  Navigator Encounter Type Pathology Review  Patient Visit Type MedOnc  Barriers/Navigation Needs No Barriers At This Time  Interventions None Required  Acuity Level 1-No Barriers  Support Groups/Services Friends and Family  Time Spent with Patient 15

## 2023-08-08 ENCOUNTER — Encounter: Payer: Self-pay | Admitting: Adult Health

## 2023-08-08 ENCOUNTER — Encounter: Payer: Self-pay | Admitting: *Deleted

## 2023-08-08 ENCOUNTER — Inpatient Hospital Stay (HOSPITAL_BASED_OUTPATIENT_CLINIC_OR_DEPARTMENT_OTHER): Admitting: Hematology & Oncology

## 2023-08-08 ENCOUNTER — Ambulatory Visit

## 2023-08-08 ENCOUNTER — Inpatient Hospital Stay: Attending: Hematology & Oncology | Admitting: Family

## 2023-08-08 ENCOUNTER — Inpatient Hospital Stay: Admitting: Licensed Clinical Social Worker

## 2023-08-08 ENCOUNTER — Telehealth: Payer: Self-pay

## 2023-08-08 ENCOUNTER — Encounter: Payer: Self-pay | Admitting: Hematology & Oncology

## 2023-08-08 VITALS — BP 140/68 | HR 71 | Temp 98.3°F | Resp 20 | Ht 71.0 in | Wt 152.4 lb

## 2023-08-08 DIAGNOSIS — C7800 Secondary malignant neoplasm of unspecified lung: Secondary | ICD-10-CM | POA: Diagnosis present

## 2023-08-08 DIAGNOSIS — C250 Malignant neoplasm of head of pancreas: Secondary | ICD-10-CM

## 2023-08-08 DIAGNOSIS — Z5111 Encounter for antineoplastic chemotherapy: Secondary | ICD-10-CM | POA: Diagnosis present

## 2023-08-08 DIAGNOSIS — C252 Malignant neoplasm of tail of pancreas: Secondary | ICD-10-CM | POA: Diagnosis not present

## 2023-08-08 DIAGNOSIS — C259 Malignant neoplasm of pancreas, unspecified: Secondary | ICD-10-CM | POA: Insufficient documentation

## 2023-08-08 LAB — CBC WITH DIFFERENTIAL (CANCER CENTER ONLY)
Abs Immature Granulocytes: 0.05 K/uL (ref 0.00–0.07)
Basophils Absolute: 0 K/uL (ref 0.0–0.1)
Basophils Relative: 0 %
Eosinophils Absolute: 0.3 K/uL (ref 0.0–0.5)
Eosinophils Relative: 3 %
HCT: 37.7 % — ABNORMAL LOW (ref 39.0–52.0)
Hemoglobin: 12.6 g/dL — ABNORMAL LOW (ref 13.0–17.0)
Immature Granulocytes: 1 %
Lymphocytes Relative: 11 %
Lymphs Abs: 1.2 K/uL (ref 0.7–4.0)
MCH: 32.2 pg (ref 26.0–34.0)
MCHC: 33.4 g/dL (ref 30.0–36.0)
MCV: 96.4 fL (ref 80.0–100.0)
Monocytes Absolute: 1.4 K/uL — ABNORMAL HIGH (ref 0.1–1.0)
Monocytes Relative: 13 %
Neutro Abs: 7.3 K/uL (ref 1.7–7.7)
Neutrophils Relative %: 72 %
Platelet Count: 162 K/uL (ref 150–400)
RBC: 3.91 MIL/uL — ABNORMAL LOW (ref 4.22–5.81)
RDW: 12.8 % (ref 11.5–15.5)
WBC Count: 10.2 K/uL (ref 4.0–10.5)
nRBC: 0 % (ref 0.0–0.2)

## 2023-08-08 LAB — CMP (CANCER CENTER ONLY)
ALT: 19 U/L (ref 0–44)
AST: 18 U/L (ref 15–41)
Albumin: 3.4 g/dL — ABNORMAL LOW (ref 3.5–5.0)
Alkaline Phosphatase: 129 U/L — ABNORMAL HIGH (ref 38–126)
Anion gap: 10 (ref 5–15)
BUN: 17 mg/dL (ref 8–23)
CO2: 24 mmol/L (ref 22–32)
Calcium: 8.9 mg/dL (ref 8.9–10.3)
Chloride: 103 mmol/L (ref 98–111)
Creatinine: 1.04 mg/dL (ref 0.61–1.24)
GFR, Estimated: >60 mL/min (ref >60)
Glucose, Bld: 231 mg/dL — ABNORMAL HIGH (ref 70–99)
Potassium: 4.1 mmol/L (ref 3.5–5.1)
Sodium: 138 mmol/L (ref 135–145)
Total Bilirubin: 0.5 mg/dL (ref 0.0–1.2)
Total Protein: 6.2 g/dL — ABNORMAL LOW (ref 6.5–8.1)

## 2023-08-08 NOTE — Progress Notes (Signed)
 Patient seen after confirmed metastatic disease. He will no longer be eligible for surgery. Plan now is for concurrent ChemoRT. Referral placed.   Patient would also like to see a Dentist. He previously had testing but declined genetic follow up. Referral placed.   Called [facility]. Path is still pending.  Oncology Nurse Navigator Documentation     08/08/2023    8:30 AM  Oncology Nurse Navigator Flowsheets  Navigator Location [facility]  Navigator Encounter Type Follow-up Appt  Patient Visit Type MedOnc  Barriers/Navigation Needs Coordination of Care  Interventions Coordination of Care;Referrals  Acuity Level 1-No Barriers  Referrals Genetics;Radiation Oncology  Coordination of Care Pathology  Support Groups/Services Friends and Family  Time Spent with Patient 15

## 2023-08-08 NOTE — Transitions of Care (Post Inpatient/ED Visit) (Signed)
   08/08/2023  Name: Alexander [name] MRN: [id number] DOB: 05-Oct-1940  Today's TOC FU Call Status: Today's TOC FU Call Status:: Successful TOC FU Call Completed TOC FU Call Complete Date: 08/08/23 Patient's Name and Date of Birth confirmed.  Transition Care Management Follow-up Telephone Call How have you been since you were released from the hospital?: Better Any questions or concerns?: No  Items Reviewed: Did you receive and understand the discharge instructions provided?: Yes   Placed call to patient today and reviewed reason for call.  Patient reports that he understands his discharge instructions. Reports that he saw Dr. Timmy today and is coming up with a plan of care.  Patient denies any need to review discharge instructions or medications.  Reports he is all good. Provided my contact information for patient to call me if needed.  Assessment not completed- patient declined.  Alan Ee, RN, BSN, CEN [employer]- Transition of Care Team.  [facility] [phone]

## 2023-08-08 NOTE — Progress Notes (Signed)
 Hematology and Oncology Follow Up Visit  Alexander [name] [id number] [birth year] 83 y.o. 08/08/2023   Principle Diagnosis:  Adenocarcinoma of the pancreas-stage Ib (T2N0M0) --clinical stage --BRCA (-), KRAS (+), HRD(-) --metastatic to lung   Current Therapy:        Status post biliary stent placement FOLFIRINOX -- Neoadj - s/p cycle #12/12  - start on 10/31/2022 --completed on 05/08/2023   Interim History:  Alexander [name] is here today for follow-up.  Unfortunately, we now documented metastatic disease.  He underwent a VATS procedure at [facility].  This was just last week.  The pathology came back as adenocarcinoma.  Really, really hate this for him.  He had done so well with the FOLFIRINOX.  He tolerated this well.  He had 12 cycles.  He unfortunately progressed after having this.  He has not been able to have resection of the pancreatic primary.  He has had no problems with cough or shortness of breath.  He does have a little bit of discomfort at the site where he had the VATS and the thoracotomy tube.  At this point, we are not can be able to think about surgical resection of the primary.  I think the next best option would be radiation therapy along with chemotherapy..  I will have to talk with Dr. Shannon of Radiation Oncology and see what he would think would be the best way to go.  I do not know if this is some that radiosurgery could take care of or if he needed external beam therapy.  He is eating okay.  Is exercising.  He swims about 1-2 hours a day.  He has had no change in bowel or bladder habits.  He has had no pruritus.  He has had no leg swelling.  He has had no headache.  Overall, I would have to say that his performance status is probably ECOG 1.    Medications:  Allergies as of 08/08/2023       Reactions   Metformin  And Related Diarrhea   Metformin  Hcl Diarrhea        Medication List        Accurate as of August 08, 2023  9:04 AM. If you have any questions,  ask your nurse or doctor.          Accu-Chek Softclix Lancets lancets Use to test blood glucose once daily   amLODipine  10 MG tablet Commonly known as: NORVASC  Take 1 tablet (10 mg total) by mouth daily.   amoxicillin  500 MG capsule Commonly known as: AMOXIL  Take four capsules (2000 mg) one hour prior to dental procedure.   Creon  36000-114000 units Cpep capsule Generic drug: lipase/protease/amylase TAKE TWO CAPSULES BY MOUTH THREE TIMES A DAY WITH MEALS. ALSO MAY TAKE ONE CAPSULE AS NEEDED WITH SNACKS (UP TO 4 SNACKS PER DAY)   Droplet Pen Needles 31G X 6 MM Misc Generic drug: Insulin  Pen Needle Use to check blood sugar TID   BD Pen Needle Nano 2nd Gen 32G X 4 MM Misc Generic drug: Insulin  Pen Needle   Embrace Pro Glucose Test test strip Generic drug: glucose blood Use as instructed   FreeStyle Libre 3 Plus Sensor Misc Change sensor every 15 days.   Gvoke HypoPen  1-Pack 1 MG/0.2ML Soaj Generic drug: Glucagon  Inject 1 mg into the skin as needed (low blood sugar with impaired consciousness).   insulin  lispro 100 UNIT/ML KwikPen Commonly known as: HumaLOG  KwikPen 2-3 units with meals 3 times a day plus sliding  scale as instructed, maximum 20 units / day.   INSULIN  SYRINGE .3CC/30GX5/16 30G X 5/16 0.3 ML Misc Use for insulin  administration three times a day   Lantus  SoloStar 100 UNIT/ML Solostar Pen Generic drug: insulin  glargine INJECT 20 UNITS INTO THE SKIN 2 TIMES A DAY What changed: additional instructions   loperamide  2 MG capsule Commonly known as: IMODIUM  Take 2 tabs by mouth with first loose stool, then 1 tab with each additional loose stool as needed. Do not exceed 8 tabs in a 24-hour period What changed:  when to take this reasons to take this   NovoLOG  FlexPen 100 UNIT/ML FlexPen Generic drug: insulin  aspart 2-3 units with meals 3 times a day plus sliding scale as instructed, maximum 20units/day What changed: additional instructions   OLANZapine   5 MG tablet Commonly known as: ZYPREXA  Take 1 tablet (5 mg total) by mouth at bedtime. What changed:  when to take this reasons to take this   ondansetron  8 MG tablet Commonly known as: Zofran  Take 1 tablet (8 mg total) by mouth every 8 (eight) hours as needed for nausea or vomiting. Start on the third day after chemotherapy   pantoprazole  40 MG tablet Commonly known as: PROTONIX  Take 1 tablet (40 mg total) by mouth 2 (two) times daily before a meal.   prochlorperazine  10 MG tablet Commonly known as: COMPAZINE  Take 1 tablet (10 mg total) by mouth every 6 (six) hours as needed for nausea or vomiting (Nausea or vomiting).   saccharomyces boulardii 250 MG capsule Commonly known as: FLORASTOR Take 250 mg by mouth 2 (two) times daily.   VITAMIN D (CHOLECALCIFEROL) PO Take by mouth 2 (two) times daily.        Allergies:  Allergies  Allergen Reactions   Metformin  And Related Diarrhea   Metformin  Hcl Diarrhea    Past Medical History, Surgical history, Social history, and Family History were reviewed and updated.  Review of Systems:  Review of Systems  Constitutional: Negative.   HENT: Negative.    Eyes: Negative.   Respiratory: Negative.    Cardiovascular: Negative.   Gastrointestinal: Negative.   Genitourinary: Negative.   Musculoskeletal: Negative.   Skin: Negative.   Neurological: Negative.   Endo/Heme/Allergies: Negative.   Psychiatric/Behavioral: Negative.       Physical Exam:  height is 5' 11 (1.803 m) and weight is 152 lb 6.4 oz (69.1 kg). His temperature is 98.3 F (36.8 C). His blood pressure is 140/68 (abnormal) and his pulse is 71. His respiration is 20 and oxygen saturation is 100%.   Wt Readings from Last 3 Encounters:  08/08/23 152 lb 6.4 oz (69.1 kg)  07/17/23 155 lb 8 oz (70.5 kg)  06/05/23 155 lb (70.3 kg)    Physical Exam Vitals reviewed.  HENT:     Head: Normocephalic and atraumatic.  Eyes:     Pupils: Pupils are equal, round, and  reactive to light.  Cardiovascular:     Rate and Rhythm: Normal rate and regular rhythm.     Heart sounds: Normal heart sounds.  Pulmonary:     Effort: Pulmonary effort is normal.     Breath sounds: Normal breath sounds.  Abdominal:     General: Bowel sounds are normal.     Palpations: Abdomen is soft.  Musculoskeletal:        General: No tenderness or deformity. Normal range of motion.     Cervical back: Normal range of motion.     Comments: Chest wall exam shows he  dressed VATS and thoracotomy site on the right lateral chest wall.  There is little bit of swelling.  There is little bit of tenderness at the sites.  Lymphadenopathy:     Cervical: No cervical adenopathy.  Skin:    General: Skin is warm and dry.     Findings: No erythema or rash.  Neurological:     Mental Status: He is alert and oriented to person, place, and time.  Psychiatric:        Behavior: Behavior normal.        Thought Content: Thought content normal.        Judgment: Judgment normal.      Lab Results  Component Value Date   WBC 10.2 08/08/2023   HGB 12.6 (L) 08/08/2023   HCT 37.7 (L) 08/08/2023   MCV 96.4 08/08/2023   PLT 162 08/08/2023   Lab Results  Component Value Date   FERRITIN 91 06/05/2023   IRON 78 06/05/2023   TIBC 298 06/05/2023   UIBC 220 06/05/2023   IRONPCTSAT 26 06/05/2023   Lab Results  Component Value Date   RETICCTPCT 0.7 08/30/2022   RBC 3.91 (L) 08/08/2023   RETICCTABS 32.6 12/19/2013   No results found for: KPAFRELGTCHN, LAMBDASER, KAPLAMBRATIO No results found for: IGGSERUM, IGA, IGMSERUM No results found for: STEPHANY CARLOTA BENSON MARKEL EARLA JOANNIE DOC VICK, SPEI   Chemistry      Component Value Date/Time   NA 138 08/08/2023 0830   NA 144 11/29/2016 1054   NA 139 12/29/2015 0954   K 4.1 08/08/2023 0830   K 4.2 11/29/2016 1054   K 4.1 12/29/2015 0954   CL 103 08/08/2023 0830   CL 102 11/29/2016 1054   CO2 24  08/08/2023 0830   CO2 30 11/29/2016 1054   CO2 26 12/29/2015 0954   BUN 17 08/08/2023 0830   BUN 22 11/29/2016 1054   BUN 20.0 12/29/2015 0954   CREATININE 1.04 08/08/2023 0830   CREATININE 1.2 11/29/2016 1054   CREATININE 1.3 12/29/2015 0954      Component Value Date/Time   CALCIUM  8.9 08/08/2023 0830   CALCIUM  9.3 11/29/2016 1054   CALCIUM  9.5 12/29/2015 0954   ALKPHOS 129 (H) 08/08/2023 0830   ALKPHOS 92 (H) 11/29/2016 1054   ALKPHOS 98 12/29/2015 0954   AST 18 08/08/2023 0830   AST 16 12/29/2015 0954   ALT 19 08/08/2023 0830   ALT 20 11/29/2016 1054   ALT 13 12/29/2015 0954   BILITOT 0.5 08/08/2023 0830   BILITOT 0.71 12/29/2015 0954       Impression and Plan:  Mr. Rorke is a pleasant 83 yo gentleman with original diagnosis of  polycythemia vera and early stage pancreatic cancer.   We are treating him in the neoadjuvant setting so that he could have a Whipple procedure.  Unfortunately, he progressed despite neoadjuvant FOLFIRINOX.  At this point, our goal is to treat the pancreatic cancer with radiotherapy and chemotherapy.  I think this would be a very reasonable way to treat.  Hopefully, we will be able to start next week.   Maude JONELLE Crease, MD 8/6/20259:04 AM

## 2023-08-08 NOTE — Progress Notes (Signed)
 [facility] CSW Progress Note  Visual merchandiser met with patient, his wife, Orie, and their daughter, Ludivina.  She is visiting from [facility] and is a pediatrician.  Purpose was to follow-up on need for community resources.    Interventions: Referred patient to community resources: Provided a list of home health and home care agencies.  Informed them of method of payment for each.  Patient reports not needing assistance currently, but his family would like to have information for when the time comes.       Follow Up Plan:  CSW will follow-up with patient by phone     Macario CHRISTELLA Au, LCSW Clinical Social Worker [facility]

## 2023-08-09 ENCOUNTER — Other Ambulatory Visit: Payer: Self-pay

## 2023-08-09 ENCOUNTER — Ambulatory Visit (INDEPENDENT_AMBULATORY_CARE_PROVIDER_SITE_OTHER): Admitting: Adult Health

## 2023-08-09 VITALS — BP 130/60 | HR 52 | Temp 98.1°F | Ht 71.0 in | Wt 150.0 lb

## 2023-08-09 DIAGNOSIS — C250 Malignant neoplasm of head of pancreas: Secondary | ICD-10-CM

## 2023-08-09 DIAGNOSIS — Z5189 Encounter for other specified aftercare: Secondary | ICD-10-CM

## 2023-08-09 LAB — CANCER ANTIGEN 19-9: CA 19-9: 55 U/mL — ABNORMAL HIGH (ref 0–35)

## 2023-08-09 NOTE — Progress Notes (Signed)
 Subjective:    Patient ID: Alexander [name], male    DOB: 1940-05-16, 83 y.o.   MRN: [id number]  HPI 83 year old male who  has a past medical history of Arthritis, CAD (coronary artery disease), Diverticulosis of colon, GERD (gastroesophageal reflux disease), GI bleed, Gout, Hematuria, microscopic, Hemorrhoids, Hyperglycemia, Hyperlipidemia, Hypertension, Lumbar back pain, Microalbuminuria, Overweight(278.02), Pancreatic cancer (HCC) (09/22/2022), Polycythemia rubra vera (HCC), and Tubular adenoma of colon (07/2012).  He presents to the office today for follow up.   He informs me that he underwent a VATS procedure at [facility] last week and pathology came back for adenocarcinoma.  Unfortunately this prevents him from having a Whipple procedure that he was hoping to have.  He has met with his oncologist, Dr. Timmy and will be starting radiation soon. Overall he reports that he is doing well and staying upbeat but is slightly fatigued.  He is eating healthy and staying active   He would like me to take a look at his surgical wound from the VATS procedure on his right back.     Review of Systems See HPI   Past Medical History:  Diagnosis Date   Arthritis    minor; shoulders primarily (05/20/2013)   CAD (coronary artery disease)    a. 05/2013 - Chest pain/unstable angina and dynamic EKG changes showing cath showing atherosclerotic coronary artery disease manifested as diffuse ectasia, normal EF.   Diverticulosis of colon    GERD (gastroesophageal reflux disease)    GI bleed    secondary to Aspirin    Gout    Hematuria, microscopic    Hemorrhoids    Hyperglycemia    Hyperlipidemia    Hypertension    Lumbar back pain    Microalbuminuria    Overweight(278.02)    Pancreatic cancer (HCC) 09/22/2022   Polycythemia rubra vera (HCC)    Tubular adenoma of colon 07/2012    Social History   Socioeconomic History   Marital status: Married    Spouse name: Not on file    Number of children: 3   Years of education: Not on file   Highest education level: Some college, no degree  Occupational History   Occupation: retired Garment/textile technologist: RETIRED  Tobacco Use   Smoking status: Never   Smokeless tobacco: Never   Tobacco comments:    NEVER USED TOBACCO  Vaping Use   Vaping status: Never Used  Substance and Sexual Activity   Alcohol use: No    Alcohol/week: 0.0 standard drinks of alcohol   Drug use: No   Sexual activity: Not Currently    Partners: Female  Other Topics Concern   Not on file  Social History Narrative   Marriedfor 52 years    Daughter, Physicist, medical (pediatrician--lives in [place], [place]), one in [place] - Warden/ranger. One in [place] - Chartered loss adjuster          Social Drivers of Corporate investment banker Strain: Low Risk  (10/16/2022)   Overall Financial Resource Strain (CARDIA)    Difficulty of Paying Living Expenses: Not hard at all  Food Insecurity: No Food Insecurity (05/14/2023)   Received from [facility]   Hunger Vital Sign    Within the past 12 months, you worried that your food would run out before you got the money to buy more.: Never true    Within the past 12 months, the food you bought just didn't last and you didn't have money to get more.: Never true  Transportation Needs: No Transportation Needs (05/14/2023)   Received from [facility] - Transportation    Lack of Transportation (Medical): No    Lack of Transportation (Non-Medical): No  Physical Activity: Sufficiently Active (10/16/2022)   Exercise Vital Sign    Days of Exercise per Week: 3 days    Minutes of Exercise per Session: 60 min  Stress: No Stress Concern Present (10/16/2022)   [employer] of Occupational Health - Occupational Stress Questionnaire    Feeling of Stress : Only a little  Social Connections: Socially Integrated (10/16/2022)   Social Connection and Isolation Panel    Frequency of Communication with Friends and Family: More than  three times a week    Frequency of Social Gatherings with Friends and Family: More than three times a week    Attends Religious Services: 1 to 4 times per year    Active Member of [employer] or Organizations: Yes    Attends Banker Meetings: Never    Marital Status: Married  Catering manager Violence: Not At Risk (11/08/2022)   Humiliation, Afraid, Rape, and Kick questionnaire    Fear of Current or Ex-Partner: No    Emotionally Abused: No    Physically Abused: No    Sexually Abused: No    Past Surgical History:  Procedure Laterality Date   BILIARY STENT PLACEMENT N/A 09/02/2022   Procedure: BILIARY STENT PLACEMENT;  Surgeon: Abran Norleen SAILOR, MD;  Location: [facility];  Service: Gastroenterology;  Laterality: N/A;   BILIARY STENT PLACEMENT  10/13/2022   Procedure: BILIARY STENT PLACEMENT;  Surgeon: Aneita Gwendlyn DASEN, MD;  Location: [facility] ENDOSCOPY;  Service: Gastroenterology;;   BIOPSY  09/11/2022   Procedure: BIOPSY;  Surgeon: Wilhelmenia Aloha Raddle., MD;  Location: [facility];  Service: Gastroenterology;;   CARDIAC CATHETERIZATION  05/20/2013   CATARACT EXTRACTION W/ INTRAOCULAR LENS  IMPLANT, BILATERAL Bilateral 2008   CYSTECTOMY  ~ 2000    SEBACEOUS cyst removed from between shoulder blades   ENDOSCOPIC RETROGRADE CHOLANGIOPANCREATOGRAPHY (ERCP) WITH PROPOFOL  N/A 09/02/2022   Procedure: ENDOSCOPIC RETROGRADE CHOLANGIOPANCREATOGRAPHY (ERCP) WITH PROPOFOL ;  Surgeon: Abran Norleen SAILOR, MD;  Location: [facility];  Service: Gastroenterology;  Laterality: N/A;   ENDOSCOPIC RETROGRADE CHOLANGIOPANCREATOGRAPHY (ERCP) WITH PROPOFOL  N/A 10/13/2022   Procedure: ENDOSCOPIC RETROGRADE CHOLANGIOPANCREATOGRAPHY (ERCP) WITH PROPOFOL ;  Surgeon: Aneita Gwendlyn DASEN, MD;  Location: [facility] ENDOSCOPY;  Service: Gastroenterology;  Laterality: N/A;   ESOPHAGOGASTRODUODENOSCOPY N/A 09/11/2022   Procedure: ESOPHAGOGASTRODUODENOSCOPY (EGD);  Surgeon: Wilhelmenia Aloha Raddle., MD;  Location: [facility];  Service:  Gastroenterology;  Laterality: N/A;   EUS N/A 09/11/2022   Procedure: UPPER ENDOSCOPIC ULTRASOUND (EUS) RADIAL;  Surgeon: Wilhelmenia Aloha Raddle., MD;  Location: [facility];  Service: Gastroenterology;  Laterality: N/A;   EXCISIONAL HEMORRHOIDECTOMY  1970's   FINE NEEDLE ASPIRATION N/A 09/11/2022   Procedure: FINE NEEDLE ASPIRATION (FNA) LINEAR;  Surgeon: Wilhelmenia Aloha Raddle., MD;  Location: [facility];  Service: Gastroenterology;  Laterality: N/A;   INGUINAL HERNIA REPAIR Right ~ 2010   IR IMAGING GUIDED PORT INSERTION  10/27/2022   LEFT HEART CATHETERIZATION WITH CORONARY ANGIOGRAM N/A 05/20/2013   Procedure: LEFT HEART CATHETERIZATION WITH CORONARY ANGIOGRAM;  Surgeon: Peter M Swaziland, MD;  Location: [facility] CATH LAB;  Service: Cardiovascular;  Laterality: N/A;   SPHINCTEROTOMY  09/02/2022   Procedure: SPHINCTEROTOMY;  Surgeon: Abran Norleen SAILOR, MD;  Location: [facility];  Service: Gastroenterology;;   CLEDA REMOVAL  10/13/2022   Procedure: STENT REMOVAL;  Surgeon: Aneita Gwendlyn DASEN, MD;  Location: [facility]COPY;  Service: Gastroenterology;;   TONSILLECTOMY AND ADENOIDECTOMY  1940's   VASECTOMY      Family History  Problem Relation Age of Onset   Colon cancer Mother        died at 45, colorectal   Diabetes Mother    Heart disease Father 58       MI   Diabetes Father    Stomach cancer Maternal Grandfather    Cancer Maternal Uncle     Allergies  Allergen Reactions   Metformin  And Related Diarrhea   Metformin  Hcl Diarrhea    Current Outpatient Medications on File Prior to Visit  Medication Sig Dispense Refill   Accu-Chek Softclix Lancets lancets Use to test blood glucose once daily 100 each 3   amLODipine  (NORVASC ) 10 MG tablet Take 1 tablet (10 mg total) by mouth daily. 90 tablet 3   amoxicillin  (AMOXIL ) 500 MG capsule Take four capsules (2000 mg) one hour prior to dental procedure. 4 capsule 0   BD PEN NEEDLE NANO 2ND GEN 32G X 4 MM MISC      Continuous Glucose Sensor (FREESTYLE  LIBRE 3 PLUS SENSOR) MISC Change sensor every 15 days. 2 each 6   Glucagon  (GVOKE HYPOPEN  1-PACK) 1 MG/0.2ML SOAJ Inject 1 mg into the skin as needed (low blood sugar with impaired consciousness). 0.4 mL 2   glucose blood (EMBRACE PRO GLUCOSE TEST) test strip Use as instructed 100 each 12   insulin  aspart (NOVOLOG  FLEXPEN) 100 UNIT/ML FlexPen 2-3 units with meals 3 times a day plus sliding scale as instructed, maximum 20units/day (Patient taking differently: 5-6 units with meals 3 times a day plus sliding scale as instructed, maximum 20units/day) 15 mL 11   insulin  glargine (LANTUS  SOLOSTAR) 100 UNIT/ML Solostar Pen INJECT 20 UNITS INTO THE SKIN 2 TIMES A DAY (Patient taking differently: INJECT 16 UNITS INTO THE SKIN daily) 12 mL 0   insulin  lispro (HUMALOG  KWIKPEN) 100 UNIT/ML KwikPen 2-3 units with meals 3 times a day plus sliding scale as instructed, maximum 20 units / day. 15 mL 11   Insulin  Pen Needle (DROPLET PEN NEEDLES) 31G X 6 MM MISC Use to check blood sugar TID 100 each 0   Insulin  Syringe-Needle U-100 (INSULIN  SYRINGE .3CC/30GX5/16) 30G X 5/16 0.3 ML MISC Use for insulin  administration three times a day 300 each 3   lipase/protease/amylase (CREON ) 36000 UNITS CPEP capsule TAKE TWO CAPSULES BY MOUTH THREE TIMES A DAY WITH MEALS. ALSO MAY TAKE ONE CAPSULE AS NEEDED WITH SNACKS (UP TO 4 SNACKS PER DAY) 200 capsule 6   loperamide  (IMODIUM ) 2 MG capsule Take 2 tabs by mouth with first loose stool, then 1 tab with each additional loose stool as needed. Do not exceed 8 tabs in a 24-hour period (Patient taking differently: as needed. Take 2 tabs by mouth with first loose stool, then 1 tab with each additional loose stool as needed. Do not exceed 8 tabs in a 24-hour period) 100 capsule 3   OLANZapine  (ZYPREXA ) 5 MG tablet Take 1 tablet (5 mg total) by mouth at bedtime. (Patient taking differently: Take 5 mg by mouth daily as needed.) 30 tablet 4   ondansetron  (ZOFRAN ) 8 MG tablet Take 1 tablet (8 mg  total) by mouth every 8 (eight) hours as needed for nausea or vomiting. Start on the third day after chemotherapy 30 tablet 1   pantoprazole  (PROTONIX ) 40 MG tablet Take 1 tablet (40 mg total) by mouth 2 (two) times daily before a meal. 180 tablet 2  prochlorperazine  (COMPAZINE ) 10 MG tablet Take 1 tablet (10 mg total) by mouth every 6 (six) hours as needed for nausea or vomiting (Nausea or vomiting). 30 tablet 1   saccharomyces boulardii (FLORASTOR) 250 MG capsule Take 250 mg by mouth 2 (two) times daily.     VITAMIN D, CHOLECALCIFEROL, PO Take by mouth 2 (two) times daily.     No current facility-administered medications on file prior to visit.    BP 130/60   Pulse (!) 52   Temp 98.1 F (36.7 C) (Oral)   Ht 5' 11 (1.803 m)   Wt 150 lb (68 kg)   SpO2 99%   BMI 20.92 kg/m       Objective:   Physical Exam Vitals and nursing note reviewed.  Constitutional:      Appearance: Normal appearance.  Musculoskeletal:        General: Normal range of motion.  Skin:    General: Skin is warm and dry.     Capillary Refill: Capillary refill takes less than 2 seconds.      Neurological:     General: No focal deficit present.     Mental Status: He is alert and oriented to person, place, and time.  Psychiatric:        Mood and Affect: Mood normal.        Behavior: Behavior normal.        Thought Content: Thought content normal.        Judgment: Judgment normal.        Assessment & Plan:  1. Malignant neoplasm of head of pancreas (HCC) (Primary)  Follow up with Oncology as directed. He knows he can reach out at anytime if he needs anything   2. Visit for wound check - Wound appears well. Bandaged with gauze and tape   Darleene Shape, NP  I personally spent a total of 32 minutes in the care of the patient today including preparing to see the patient, getting/reviewing separately obtained history, performing a medically appropriate exam/evaluation, counseling and educating,  documenting clinical information in the EHR, and communicating results.

## 2023-08-10 ENCOUNTER — Encounter: Payer: Self-pay | Admitting: *Deleted

## 2023-08-10 NOTE — Progress Notes (Signed)
 Per Dr Timmy, request for [facility] One testing sent on R Lower Lung wedge from [facility] 08/03/2023.  Plan for patient to start concurrent ChemoRT. Will follow for RadOnc and MedOnc planning.   Oncology Nurse Navigator Documentation     08/10/2023    3:00 PM  Oncology Nurse Navigator Flowsheets  Navigator Follow Up Date: 08/14/2023  Navigator Follow Up Reason: Appointment Review  Navigator Location [facility]  Navigator Encounter Type Follow-up Appt;Molecular Studies  Patient Visit Type MedOnc  Treatment Phase Pre-Tx/Tx Discussion  Barriers/Navigation Needs Coordination of Care  Interventions Coordination of Care  Acuity Level 2-Minimal Needs (1-2 Barriers Identified)  Coordination of Care Pathology  Support Groups/Services Friends and Family  Time Spent with Patient 30

## 2023-08-12 ENCOUNTER — Other Ambulatory Visit: Payer: Self-pay

## 2023-08-13 ENCOUNTER — Encounter: Payer: Self-pay | Admitting: Family

## 2023-08-13 ENCOUNTER — Encounter: Payer: Self-pay | Admitting: Hematology & Oncology

## 2023-08-13 NOTE — Addendum Note (Signed)
 Addended by: TIMMY COY R on: 08/13/2023 03:07 PM   Modules accepted: Orders

## 2023-08-13 NOTE — Addendum Note (Signed)
 Addended by: TIMMY COY R on: 08/13/2023 03:11 PM   Modules accepted: Orders

## 2023-08-14 ENCOUNTER — Ambulatory Visit (INDEPENDENT_AMBULATORY_CARE_PROVIDER_SITE_OTHER): Admitting: Endocrinology

## 2023-08-14 ENCOUNTER — Other Ambulatory Visit: Payer: Self-pay

## 2023-08-14 ENCOUNTER — Ambulatory Visit: Admitting: Adult Health

## 2023-08-14 ENCOUNTER — Encounter: Payer: Self-pay | Admitting: Endocrinology

## 2023-08-14 ENCOUNTER — Encounter: Payer: Self-pay | Admitting: *Deleted

## 2023-08-14 ENCOUNTER — Encounter: Payer: Self-pay | Admitting: Hematology & Oncology

## 2023-08-14 VITALS — BP 118/60 | HR 56 | Resp 20 | Ht 71.0 in | Wt 154.2 lb

## 2023-08-14 DIAGNOSIS — E1165 Type 2 diabetes mellitus with hyperglycemia: Secondary | ICD-10-CM

## 2023-08-14 DIAGNOSIS — Z794 Long term (current) use of insulin: Secondary | ICD-10-CM | POA: Diagnosis not present

## 2023-08-14 NOTE — Progress Notes (Signed)
 Spoke with Dr Timmy. He and Dr Shannon plan to start concurrent ChemoRT on 08/27/2023. Message sent to patient updating him to the plan. Message also sent to scheduling to reschedule his current 08/20/2023 appointment.   Oncology Nurse Navigator Documentation     08/14/2023   11:00 AM  Oncology Nurse Navigator Flowsheets  Navigator Follow Up Date: 08/27/2023  Navigator Follow Up Reason: Follow-up Appointment;Chemotherapy  Navigator Location [facility]  Navigator Encounter Type MyChart;Appt/Treatment Plan Review  Patient Visit Type MedOnc  Treatment Phase Pre-Tx/Tx Discussion  Barriers/Navigation Needs Coordination of Care  Education Other  Interventions Coordination of Care;Education  Acuity Level 2-Minimal Needs (1-2 Barriers Identified)  Coordination of Care Appts;Other  Support Groups/Services Friends and Family  Time Spent with Patient 30

## 2023-08-14 NOTE — Patient Instructions (Addendum)
 Diabetes regimingmen:   Lantus  16 units daily in the morning once a day only.  On swimming day take 12 units daily only to avoid low blood sugar.  Humalog  6 units with breakfast, 6 units with lunch and 6 units with supper.  Take 4 units with breakfast and lunch on swimming days.  If your meal has added carbohydrates take up to 8 units with meal.  Take Humalog  10 to 15 minutes before eating if possible.  Use the following sliding scale if blood sugar is high before eating to give additional insulin  based on sliding scale for the meals.  Mild Sliding Scale Blood Glucose        Insulin  60-150                     None 151-200                   1 unit 201-250                   2 units 251-300                   4 units 301-350                   6 units 351-400                   8 units      >400                        9 units and call provider   On chemo time with dexamethasone  okay to take lantus  20 units for 3 days only.

## 2023-08-14 NOTE — Progress Notes (Signed)
 Outpatient Endocrinology Note [place] Claudene Gatliff, MD  08/14/23  Patient's Name: Alexander [name]    DOB: 1940-03-07    MRN: [id number]                                                    REASON OF VISIT: Follow-up for type 2 diabetes mellitus  REFERRING PROVIDER: Merna Huxley, NP  PCP: Merna Huxley, NP  HISTORY OF PRESENT ILLNESS:   JEWELL RYANS is a 83 y.o. old male with past medical history listed below, is here for follow-up for type 2 diabetes mellitus.   Pertinent Diabetes History: Patient has type 2 diabetes mellitus, based on hemoglobin A1c 6.6% at least from 2008, was controlled with mostly hemoglobin A1c in the range of 5.6 to 6.4% for several years, mostly in prediabetes range on lifestyle modification.  In March 2024 he had hemoglobin A1c elevated to 8.1%, was started on glipizide  however later he stopped due to patient concern of weight loss after few weeks.  He had tried metformin  in the past for short time, stopped due to GI intolerance/diarrhea.  He was diagnosed with adenocarcinoma of pancreas in September 2024, is being treated with neoadjuvant chemotherapy, ? possible surgery in the future. During the course of pancreatic cancer treatment he was started on insulin  therapy in October 2024, and referred to endocrinology for further evaluation and management of type 2 diabetes mellitus, was initially seen in December 2024.  She has been seeing dietitian/nutritionist as well.  Family h/o diabetes mellitus: mother and father had type 2 diabetes mellitus.SABRA    He was started on Lantus  20 units 2 times a day and Humulin R  5 units with meals 3 times a day.  Lantus  evening dose was gradually decreased, was taking 10 units until 2 days ago and he stopped.  He continued on Lantus  20 units in the morning only.  Insulin  doses has been adjusted over time.  Chronic Diabetes Complications : Retinopathy: no , reportedly. Last ophthalmology exam was done on annually.  Nephropathy:  no Peripheral neuropathy: no Coronary artery disease: no Stroke: no  Relevant comorbidities and cardiovascular risk factors: Obesity: no Body mass index is 21.51 kg/m.  Hypertension: Yes  Hyperlipidemia : Yes, on statin   Current / Home Diabetic regimen includes:  Lantus  6 units in the morning daily.  Humalog  6 units with meals 3 times a day.   Prior diabetic medications: Glipizide , was treated in March 2024.  Metformin  had GI intolerance with diarrhea in the past.  Glycemic data:    CONTINUOUS GLUCOSE MONITORING SYSTEM (CGMS) INTERPRETATION: At today's visit, we reviewed CGM downloads. The full report is scanned in the media. Reviewing the CGM trends, blood glucose are as follows:  FreeStyle Libre 3 CGM-  Sensor Download (Sensor download was reviewed and summarized below.) Dates: July 30 to August 14, 2023, 14 days Sensor Average: 177 Glucose Management Indicator: 7.5%     Interpretation: Mostly acceptable blood sugars.  Occasional hypoglycemia with blood sugar up to 80-300 range related to the meals at random times especially with breakfast and lunch, sometimes overnight.  Rare mild hypoglycemia with blood sugar in 60s around late afternoon.  No other concerning hypoglycemia.  Hypoglycemia: Patient has minor hypoglycemic episodes. Patient has hypoglycemia awareness.  Factors modifying glucose control: 1.  Diabetic diet assessment: Small and variable meals, loss  of appetite with recent diagnosis of pancreatic cancer on chemotherapy, unpredictable eating.  2.  Staying active or exercising: none  3.  Medication compliance: compliant all of the time.  Other: He was diagnosed with adenocarcinoma of pancreas in September 2024, is being treated with neoadjuvant chemotherapy, possible surgery in the future.  With a plan to follow-up surgery in the future.  Has been following with oncology.  Interval history  CGM data as reviewed above.  Random hyperglycemia related to  meals.  Rare hypoglycemia in the late afternoon related to exercise/swimming.  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  Patient has known adenocarcinoma of the pancreas.  He was deemed not a surgical candidate.  He has been following with oncology.  Patient reports there is no plan for surgery possibly planning for chemotherapy in the future.  Currently he is not on chemotherapy or any glucocorticoid.  He has a question about insulin  pump.  Discussed that insulin  pump may not be the best option at this time.  Insulin  pump also has long learning steps before we will be able to use the pump appropriately.  If he requires glucocorticoid high-dose treatment for potential chemotherapy in the future, insulin  pump will not be the best option.In that case he would need frequent rapid acting insulin .  He reasonable control of diabetes with current regimen of insulin .  Discussed and decided to stay on multidose insulin  regimen for now.  REVIEW OF SYSTEMS As per history of present illness.   PAST MEDICAL HISTORY: Past Medical History:  Diagnosis Date   Arthritis    minor; shoulders primarily (05/20/2013)   CAD (coronary artery disease)    a. 05/2013 - Chest pain/unstable angina and dynamic EKG changes showing cath showing atherosclerotic coronary artery disease manifested as diffuse ectasia, normal EF.   Diverticulosis of colon    GERD (gastroesophageal reflux disease)    GI bleed    secondary to Aspirin    Gout    Hematuria, microscopic    Hemorrhoids    Hyperglycemia    Hyperlipidemia    Hypertension    Lumbar back pain    Microalbuminuria    Overweight(278.02)    Pancreatic cancer (HCC) 09/22/2022   Polycythemia rubra vera (HCC)    Tubular adenoma of colon 07/2012    PAST SURGICAL HISTORY: Past Surgical History:  Procedure Laterality Date   BILIARY STENT PLACEMENT N/A 09/02/2022    Procedure: BILIARY STENT PLACEMENT;  Surgeon: Abran Norleen SAILOR, MD;  Location: [facility];  Service: Gastroenterology;  Laterality: N/A;   BILIARY STENT PLACEMENT  10/13/2022   Procedure: BILIARY STENT PLACEMENT;  Surgeon: Aneita Gwendlyn DASEN, MD;  Location: [facility] ENDOSCOPY;  Service: Gastroenterology;;   BIOPSY  09/11/2022   Procedure: BIOPSY;  Surgeon: Wilhelmenia Aloha Raddle., MD;  Location: [facility];  Service: Gastroenterology;;   CARDIAC CATHETERIZATION  05/20/2013   CATARACT EXTRACTION W/ INTRAOCULAR LENS  IMPLANT, BILATERAL Bilateral 2008   CYSTECTOMY  ~ 2000    SEBACEOUS cyst removed from between shoulder blades   ENDOSCOPIC RETROGRADE CHOLANGIOPANCREATOGRAPHY (ERCP) WITH PROPOFOL  N/A 09/02/2022   Procedure: ENDOSCOPIC RETROGRADE CHOLANGIOPANCREATOGRAPHY (ERCP) WITH PROPOFOL ;  Surgeon: Abran Norleen SAILOR, MD;  Location: [facility];  Service: Gastroenterology;  Laterality: N/A;   ENDOSCOPIC RETROGRADE CHOLANGIOPANCREATOGRAPHY (ERCP) WITH PROPOFOL  N/A 10/13/2022   Procedure: ENDOSCOPIC RETROGRADE CHOLANGIOPANCREATOGRAPHY (ERCP) WITH PROPOFOL ;  Surgeon: Aneita Gwendlyn DASEN, MD;  Location: [facility] ENDOSCOPY;  Service: Gastroenterology;  Laterality: N/A;  ESOPHAGOGASTRODUODENOSCOPY N/A 09/11/2022   Procedure: ESOPHAGOGASTRODUODENOSCOPY (EGD);  Surgeon: Wilhelmenia Aloha Raddle., MD;  Location: [facility];  Service: Gastroenterology;  Laterality: N/A;   EUS N/A 09/11/2022   Procedure: UPPER ENDOSCOPIC ULTRASOUND (EUS) RADIAL;  Surgeon: Wilhelmenia Aloha Raddle., MD;  Location: [facility];  Service: Gastroenterology;  Laterality: N/A;   EXCISIONAL HEMORRHOIDECTOMY  1970's   FINE NEEDLE ASPIRATION N/A 09/11/2022   Procedure: FINE NEEDLE ASPIRATION (FNA) LINEAR;  Surgeon: Wilhelmenia Aloha Raddle., MD;  Location: [facility];  Service: Gastroenterology;  Laterality: N/A;   INGUINAL HERNIA REPAIR Right ~ 2010   IR IMAGING GUIDED PORT INSERTION  10/27/2022   LEFT HEART CATHETERIZATION WITH CORONARY ANGIOGRAM N/A  05/20/2013   Procedure: LEFT HEART CATHETERIZATION WITH CORONARY ANGIOGRAM;  Surgeon: Peter M Swaziland, MD;  Location: [facility] CATH LAB;  Service: Cardiovascular;  Laterality: N/A;   SPHINCTEROTOMY  09/02/2022   Procedure: SPHINCTEROTOMY;  Surgeon: Abran Norleen SAILOR, MD;  Location: [facility];  Service: Gastroenterology;;   CLEDA REMOVAL  10/13/2022   Procedure: STENT REMOVAL;  Surgeon: Aneita Gwendlyn DASEN, MD;  Location: [facility] ENDOSCOPY;  Service: Gastroenterology;;   TONSILLECTOMY AND ADENOIDECTOMY  1940's   VASECTOMY      ALLERGIES: Allergies  Allergen Reactions   Metformin  And Related Diarrhea   Metformin  Hcl Diarrhea    FAMILY HISTORY:  Family History  Problem Relation Age of Onset   Colon cancer Mother        died at 79, colorectal   Diabetes Mother    Heart disease Father 22       MI   Diabetes Father    Stomach cancer Maternal Grandfather    Cancer Maternal Uncle     SOCIAL HISTORY: Social History   Socioeconomic History   Marital status: Married    Spouse name: Not on file   Number of children: 3   Years of education: Not on file   Highest education level: Some college, no degree  Occupational History   Occupation: retired Garment/textile technologist: RETIRED  Tobacco Use   Smoking status: Never   Smokeless tobacco: Never   Tobacco comments:    NEVER USED TOBACCO  Vaping Use   Vaping status: Never Used  Substance and Sexual Activity   Alcohol use: No    Alcohol/week: 0.0 standard drinks of alcohol   Drug use: No   Sexual activity: Not Currently    Partners: Female  Other Topics Concern   Not on file  Social History Narrative   Marriedfor 52 years    Daughter, Physicist, medical (pediatrician--lives in [place], [place]), one in [place] - Warden/ranger. One in [place] - Chartered loss adjuster          Social Drivers of Health   Financial Resource Strain: Low Risk  (08/11/2023)   Overall Financial Resource Strain (CARDIA)    Difficulty of Paying Living Expenses: Not hard at all  Food Insecurity: No Food  Insecurity (08/11/2023)   Hunger Vital Sign    Worried About Running Out of Food in the Last Year: Never true    Ran Out of Food in the Last Year: Never true  Transportation Needs: No Transportation Needs (08/11/2023)   PRAPARE - Administrator, Civil Service (Medical): No    Lack of Transportation (Non-Medical): No  Physical Activity: Sufficiently Active (08/11/2023)   Exercise Vital Sign    Days of Exercise per Week: 7 days    Minutes of Exercise per Session: 90 min  Stress: No Stress Concern Present (08/11/2023)  [employer] of Occupational Health - Occupational Stress Questionnaire    Feeling of Stress: Not at all  Social Connections: Socially Integrated (08/11/2023)   Social Connection and Isolation Panel    Frequency of Communication with Friends and Family: More than three times a week    Frequency of Social Gatherings with Friends and Family: More than three times a week    Attends Religious Services: 1 to 4 times per year    Active Member of [employer] or Organizations: Yes    Attends Banker Meetings: 1 to 4 times per year    Marital Status: Married    MEDICATIONS:  Current Outpatient Medications  Medication Sig Dispense Refill   Accu-Chek Softclix Lancets lancets Use to test blood glucose once daily 100 each 3   amLODipine  (NORVASC ) 10 MG tablet Take 1 tablet (10 mg total) by mouth daily. 90 tablet 3   amoxicillin  (AMOXIL ) 500 MG capsule Take four capsules (2000 mg) one hour prior to dental procedure. 4 capsule 0   BD PEN NEEDLE NANO 2ND GEN 32G X 4 MM MISC      Continuous Glucose Sensor (FREESTYLE LIBRE 3 PLUS SENSOR) MISC Change sensor every 15 days. 2 each 6   Glucagon  (GVOKE HYPOPEN  1-PACK) 1 MG/0.2ML SOAJ Inject 1 mg into the skin as needed (low blood sugar with impaired consciousness). 0.4 mL 2   glucose blood (EMBRACE PRO GLUCOSE TEST) test strip Use as instructed 100 each 12   insulin  aspart (NOVOLOG  FLEXPEN) 100 UNIT/ML FlexPen 2-3 units with  meals 3 times a day plus sliding scale as instructed, maximum 20units/day (Patient taking differently: 5-6 units with meals 3 times a day plus sliding scale as instructed, maximum 20units/day) 15 mL 11   insulin  glargine (LANTUS  SOLOSTAR) 100 UNIT/ML Solostar Pen INJECT 20 UNITS INTO THE SKIN 2 TIMES A DAY (Patient taking differently: INJECT 16 UNITS INTO THE SKIN daily) 12 mL 0   insulin  lispro (HUMALOG  KWIKPEN) 100 UNIT/ML KwikPen 2-3 units with meals 3 times a day plus sliding scale as instructed, maximum 20 units / day. 15 mL 11   Insulin  Pen Needle (DROPLET PEN NEEDLES) 31G X 6 MM MISC Use to check blood sugar TID 100 each 0   Insulin  Syringe-Needle U-100 (INSULIN  SYRINGE .3CC/30GX5/16) 30G X 5/16 0.3 ML MISC Use for insulin  administration three times a day 300 each 3   lipase/protease/amylase (CREON ) 36000 UNITS CPEP capsule TAKE TWO CAPSULES BY MOUTH THREE TIMES A DAY WITH MEALS. ALSO MAY TAKE ONE CAPSULE AS NEEDED WITH SNACKS (UP TO 4 SNACKS PER DAY) 200 capsule 6   OLANZapine  (ZYPREXA ) 5 MG tablet Take 1 tablet (5 mg total) by mouth at bedtime. 30 tablet 4   pantoprazole  (PROTONIX ) 40 MG tablet Take 1 tablet (40 mg total) by mouth 2 (two) times daily before a meal. 180 tablet 2   saccharomyces boulardii (FLORASTOR) 250 MG capsule Take 250 mg by mouth 2 (two) times daily.     VITAMIN D, CHOLECALCIFEROL, PO Take by mouth 2 (two) times daily.     No current facility-administered medications for this visit.    PHYSICAL EXAM: Vitals:   08/14/23 1357  BP: 118/60  Pulse: (!) 56  Resp: 20  SpO2: 98%  Weight: 154 lb 3.2 oz (69.9 kg)  Height: 5' 11 (1.803 m)     Body mass index is 21.51 kg/m.  Wt Readings from Last 3 Encounters:  08/14/23 154 lb 3.2 oz (69.9 kg)  08/09/23 150 lb (68  kg)  08/08/23 152 lb 6.4 oz (69.1 kg)    General: Well developed, well nourished male in no apparent distress.  HEENT: AT/, no external lesions.  Eyes: Conjunctiva clear and no icterus. Neck: Neck  supple  Lungs: Respirations not labored Neurologic: Alert, oriented, normal speech Extremities / Skin: Dry.  Psychiatric: Does not appear depressed or anxious  Diabetic Foot Exam - Simple   No data filed    LABS Reviewed Lab Results  Component Value Date   HGBA1C 6.6 (A) 05/01/2023   HGBA1C 6.1 (A) 01/31/2023   HGBA1C 6.2 (A) 12/14/2022   No results found for: FRUCTOSAMINE Lab Results  Component Value Date   CHOL 170 03/16/2021   HDL 47.70 03/16/2021   LDLCALC 85 03/16/2021   TRIG 183.0 (H) 03/16/2021   CHOLHDL 4 03/16/2021   Lab Results  Component Value Date   MICRALBCREAT 56 (H) 01/31/2023   MICRALBCREAT 26.7 06/11/2007   Lab Results  Component Value Date   CREATININE 1.04 08/08/2023   Lab Results  Component Value Date   GFR 56.87 (L) 09/05/2022    ASSESSMENT / PLAN  1. Type 2 diabetes mellitus with hyperglycemia, with long-term current use of insulin  (HCC)     Diabetes Mellitus type 2, complicated by no other known complication. - Diabetic status / severity: fair Controlled  Lab Results  Component Value Date   HGBA1C 6.6 (A) 05/01/2023    - Hemoglobin A1c goal : <7%  Patient has long history of type 2 diabetes mellitus/prediabetes for several years, was controlled on lifestyle modification.  Multidose insulin  therapy was started in October 2024 after the diagnosis of pancreatic cancer during the course of chemotherapy treatment.  He is deemed to be nonsurgical candidate.  Possibly planning for chemotherapy in the future with glucocorticoid.  CGM data as reviewed above.   - Medications: See below.  Adjusted as follows.  To avoid hypoglycemia during swimming days.  Adjusted insulin  regimen as follows.  To avoid hyperglycemia advised patient to use sliding scale and adjusted Humalog  as follows.  Diabetes regimen:   Lantus  16 units daily in the morning once a day only.  On swimming day take 12 units daily only to avoid low blood sugar.  Humalog  6  units with breakfast, 6 units with lunch and 6 units with supper.  Take 4 units with breakfast and lunch on swimming days.  If your meal has added carbohydrates take up to 8 units with meal.  Take Humalog  10 to 15 minutes before eating if possible.  Use the following sliding scale if blood sugar is high before eating to give additional insulin  based on sliding scale for the meals.  Mild Sliding Scale Blood Glucose        Insulin  60-150                     None 151-200                   1 unit 201-250                   2 units 251-300                   4 units 301-350                   6 units 351-400                   8 units      >  400                        9 units and call provider   Patient is asked to call our clinic if he be on chemotherapy with glucocorticoid in that case he will require higher dose of insulin  and frequent monitoring.    - Home glucose testing: Freestyle libre 3 and check as needed.  - Discussed/ Gave Hypoglycemia treatment plan.  Advised to take glucose tablet 2 to 4 units for correcting hypoglycemia.  Advised to get it from pharmacy over-the-counter.  Glucagon  emergency kit prescribed.  # Consult : not required at this time.   # Annual urine for microalbuminuria/ creatinine ratio, + microalbuminuria currently.    Last  Lab Results  Component Value Date   MICRALBCREAT 56 (H) 01/31/2023    # Foot check nightly / neuropathy.  # Annual dilated diabetic eye exams.    2. Blood pressure  -  BP Readings from Last 1 Encounters:  08/14/23 118/60    - Control is in target. - No change in current plans.  3. Lipid status / Hyperlipidemia - Last  Lab Results  Component Value Date   LDLCALC 85 03/16/2021    Diagnoses and all orders for this visit:  Type 2 diabetes mellitus with hyperglycemia, with long-term current use of insulin  (HCC)   DISPOSITION Follow up in clinic in 3 months suggested.  Asked to call our clinic in between the visits in regard to  question or concern regarding blood glucose.   All questions answered and patient verbalized understanding of the plan.  [place]ra[place] Dayle Sherpa, MD [facility] Endocrinology [facility] [address] [place], [address] [place], [place] [address] Phone # [phone]  At least part of this note was generated using voice recognition software. Inadvertent word errors may have occurred, which were not recognized during the proofreading process.

## 2023-08-15 ENCOUNTER — Encounter: Payer: Self-pay | Admitting: Adult Health

## 2023-08-15 ENCOUNTER — Ambulatory Visit (INDEPENDENT_AMBULATORY_CARE_PROVIDER_SITE_OTHER): Admitting: Adult Health

## 2023-08-15 ENCOUNTER — Encounter: Payer: Self-pay | Admitting: Gastroenterology

## 2023-08-15 ENCOUNTER — Other Ambulatory Visit: Payer: Self-pay

## 2023-08-15 ENCOUNTER — Encounter: Payer: Self-pay | Admitting: Hematology & Oncology

## 2023-08-15 ENCOUNTER — Ambulatory Visit: Admitting: Adult Health

## 2023-08-15 VITALS — BP 136/82 | HR 55 | Temp 97.8°F | Ht 71.0 in | Wt 155.0 lb

## 2023-08-15 DIAGNOSIS — Z5189 Encounter for other specified aftercare: Secondary | ICD-10-CM

## 2023-08-15 NOTE — Progress Notes (Signed)
 GI Location of Tumor / Histology:    Alexander [name] presented *** months ago with symptoms of: ***  Biopsies of pancreas (if applicable) revealed:   Past/Anticipated interventions by surgeon, if any: yes   Past/Anticipated interventions by medical oncology, if any: yes   Weight changes, if any: {:18581}  Bowel/Bladder complaints, if any: {:18581}  Nausea / Vomiting, if any: {:18581}  Pain issues, if any:  {:18581}  Any blood per rectum:   {:18581}  SAFETY ISSUES: Prior radiation? {:18581} Pacemaker/ICD? {:18581} Possible current pregnancy? {:18581} Is the patient on methotrexate? {:18581}  Current Complaints/Details:

## 2023-08-15 NOTE — Progress Notes (Signed)
 Subjective:    Patient ID: Alexander [name], male    DOB: [birth year], 83 y.o.   MRN: [id number]  Wound Check    83 year old male who  has a past medical history of Arthritis, CAD (coronary artery disease), Diverticulosis of colon, GERD (gastroesophageal reflux disease), GI bleed, Gout, Hematuria, microscopic, Hemorrhoids, Hyperglycemia, Hyperlipidemia, Hypertension, Lumbar back pain, Microalbuminuria, Overweight(278.02), Pancreatic cancer (HCC) (09/22/2022), Polycythemia rubra vera (HCC), and Tubular adenoma of colon (07/2012).  He presents to the office today for follow up regarding wound care. He had a VATS procedure roughly two weeks ago at [facility] and would like me to look at his surgical wound on his right back to make sure it is healing well.   Review of Systems See HPI   Past Medical History:  Diagnosis Date   Arthritis    minor; shoulders primarily (05/20/2013)   CAD (coronary artery disease)    a. 05/2013 - Chest pain/unstable angina and dynamic EKG changes showing cath showing atherosclerotic coronary artery disease manifested as diffuse ectasia, normal EF.   Diverticulosis of colon    GERD (gastroesophageal reflux disease)    GI bleed    secondary to Aspirin    Gout    Hematuria, microscopic    Hemorrhoids    Hyperglycemia    Hyperlipidemia    Hypertension    Lumbar back pain    Microalbuminuria    Overweight(278.02)    Pancreatic cancer (HCC) 09/22/2022   Polycythemia rubra vera (HCC)    Tubular adenoma of colon 07/2012    Social History   Socioeconomic History   Marital status: Married    Spouse name: Not on file   Number of children: 3   Years of education: Not on file   Highest education level: Some college, no degree  Occupational History   Occupation: retired Garment/textile technologist: RETIRED  Tobacco Use   Smoking status: Never   Smokeless tobacco: Never   Tobacco comments:    NEVER USED TOBACCO  Vaping Use   Vaping status: Never Used  Substance  and Sexual Activity   Alcohol use: No    Alcohol/week: 0.0 standard drinks of alcohol   Drug use: No   Sexual activity: Not Currently    Partners: Female  Other Topics Concern   Not on file  Social History Narrative   Marriedfor 52 years    Daughter, Physicist, medical (pediatrician--lives in [place], [place]), one in [place] - Warden/ranger. One in [place] - Chartered loss adjuster          Social Drivers of Health   Financial Resource Strain: Low Risk  (08/11/2023)   Overall Financial Resource Strain (CARDIA)    Difficulty of Paying Living Expenses: Not hard at all  Food Insecurity: No Food Insecurity (08/11/2023)   Hunger Vital Sign    Worried About Running Out of Food in the Last Year: Never true    Ran Out of Food in the Last Year: Never true  Transportation Needs: No Transportation Needs (08/11/2023)   PRAPARE - Administrator, Civil Service (Medical): No    Lack of Transportation (Non-Medical): No  Physical Activity: Sufficiently Active (08/11/2023)   Exercise Vital Sign    Days of Exercise per Week: 7 days    Minutes of Exercise per Session: 90 min  Stress: No Stress Concern Present (08/11/2023)   [employer] of Occupational Health - Occupational Stress Questionnaire    Feeling of Stress: Not at all  Social Connections: Socially Integrated (08/11/2023)  Social Connection and Isolation Panel    Frequency of Communication with Friends and Family: More than three times a week    Frequency of Social Gatherings with Friends and Family: More than three times a week    Attends Religious Services: 1 to 4 times per year    Active Member of Clubs or Organizations: Yes    Attends Banker Meetings: 1 to 4 times per year    Marital Status: Married  Catering manager Violence: Not At Risk (11/08/2022)   Humiliation, Afraid, Rape, and Kick questionnaire    Fear of Current or Ex-Partner: No    Emotionally Abused: No    Physically Abused: No    Sexually Abused: No    Past Surgical History:   Procedure Laterality Date   BILIARY STENT PLACEMENT N/A 09/02/2022   Procedure: BILIARY STENT PLACEMENT;  Surgeon: Abran Norleen SAILOR, MD;  Location: [facility];  Service: Gastroenterology;  Laterality: N/A;   BILIARY STENT PLACEMENT  10/13/2022   Procedure: BILIARY STENT PLACEMENT;  Surgeon: Aneita Gwendlyn DASEN, MD;  Location: [facility] ENDOSCOPY;  Service: Gastroenterology;;   BIOPSY  09/11/2022   Procedure: BIOPSY;  Surgeon: Wilhelmenia Aloha Raddle., MD;  Location: [facility];  Service: Gastroenterology;;   CARDIAC CATHETERIZATION  05/20/2013   CATARACT EXTRACTION W/ INTRAOCULAR LENS  IMPLANT, BILATERAL Bilateral 2008   CYSTECTOMY  ~ 2000    SEBACEOUS cyst removed from between shoulder blades   ENDOSCOPIC RETROGRADE CHOLANGIOPANCREATOGRAPHY (ERCP) WITH PROPOFOL  N/A 09/02/2022   Procedure: ENDOSCOPIC RETROGRADE CHOLANGIOPANCREATOGRAPHY (ERCP) WITH PROPOFOL ;  Surgeon: Abran Norleen SAILOR, MD;  Location: [facility];  Service: Gastroenterology;  Laterality: N/A;   ENDOSCOPIC RETROGRADE CHOLANGIOPANCREATOGRAPHY (ERCP) WITH PROPOFOL  N/A 10/13/2022   Procedure: ENDOSCOPIC RETROGRADE CHOLANGIOPANCREATOGRAPHY (ERCP) WITH PROPOFOL ;  Surgeon: Aneita Gwendlyn DASEN, MD;  Location: [facility] ENDOSCOPY;  Service: Gastroenterology;  Laterality: N/A;   ESOPHAGOGASTRODUODENOSCOPY N/A 09/11/2022   Procedure: ESOPHAGOGASTRODUODENOSCOPY (EGD);  Surgeon: Wilhelmenia Aloha Raddle., MD;  Location: [facility];  Service: Gastroenterology;  Laterality: N/A;   EUS N/A 09/11/2022   Procedure: UPPER ENDOSCOPIC ULTRASOUND (EUS) RADIAL;  Surgeon: Wilhelmenia Aloha Raddle., MD;  Location: [facility];  Service: Gastroenterology;  Laterality: N/A;   EXCISIONAL HEMORRHOIDECTOMY  1970's   FINE NEEDLE ASPIRATION N/A 09/11/2022   Procedure: FINE NEEDLE ASPIRATION (FNA) LINEAR;  Surgeon: Wilhelmenia Aloha Raddle., MD;  Location: [facility];  Service: Gastroenterology;  Laterality: N/A;   INGUINAL HERNIA REPAIR Right ~ 2010   IR IMAGING GUIDED PORT INSERTION   10/27/2022   LEFT HEART CATHETERIZATION WITH CORONARY ANGIOGRAM N/A 05/20/2013   Procedure: LEFT HEART CATHETERIZATION WITH CORONARY ANGIOGRAM;  Surgeon: Peter M Swaziland, MD;  Location: [facility] CATH LAB;  Service: Cardiovascular;  Laterality: N/A;   SPHINCTEROTOMY  09/02/2022   Procedure: SPHINCTEROTOMY;  Surgeon: Abran Norleen SAILOR, MD;  Location: [facility];  Service: Gastroenterology;;   CLEDA REMOVAL  10/13/2022   Procedure: STENT REMOVAL;  Surgeon: Aneita Gwendlyn DASEN, MD;  Location: [facility] ENDOSCOPY;  Service: Gastroenterology;;   TONSILLECTOMY AND ADENOIDECTOMY  1940's   VASECTOMY      Family History  Problem Relation Age of Onset   Colon cancer Mother        died at 49, colorectal   Diabetes Mother    Heart disease Father 6       MI   Diabetes Father    Stomach cancer Maternal Grandfather    Cancer Maternal Uncle     Allergies  Allergen Reactions   Metformin  And Related Diarrhea   Metformin  Hcl Diarrhea  Current Outpatient Medications on File Prior to Visit  Medication Sig Dispense Refill   Accu-Chek Softclix Lancets lancets Use to test blood glucose once daily 100 each 3   amLODipine  (NORVASC ) 10 MG tablet Take 1 tablet (10 mg total) by mouth daily. 90 tablet 3   amoxicillin  (AMOXIL ) 500 MG capsule Take four capsules (2000 mg) one hour prior to dental procedure. 4 capsule 0   BD PEN NEEDLE NANO 2ND GEN 32G X 4 MM MISC      Continuous Glucose Sensor (FREESTYLE LIBRE 3 PLUS SENSOR) MISC Change sensor every 15 days. 2 each 6   Glucagon  (GVOKE HYPOPEN  1-PACK) 1 MG/0.2ML SOAJ Inject 1 mg into the skin as needed (low blood sugar with impaired consciousness). 0.4 mL 2   glucose blood (EMBRACE PRO GLUCOSE TEST) test strip Use as instructed 100 each 12   insulin  aspart (NOVOLOG  FLEXPEN) 100 UNIT/ML FlexPen 2-3 units with meals 3 times a day plus sliding scale as instructed, maximum 20units/day (Patient taking differently: 5-6 units with meals 3 times a day plus sliding scale as instructed,  maximum 20units/day) 15 mL 11   insulin  glargine (LANTUS  SOLOSTAR) 100 UNIT/ML Solostar Pen INJECT 20 UNITS INTO THE SKIN 2 TIMES A DAY (Patient taking differently: INJECT 16 UNITS INTO THE SKIN daily) 12 mL 0   insulin  lispro (HUMALOG  KWIKPEN) 100 UNIT/ML KwikPen 2-3 units with meals 3 times a day plus sliding scale as instructed, maximum 20 units / day. 15 mL 11   Insulin  Pen Needle (DROPLET PEN NEEDLES) 31G X 6 MM MISC Use to check blood sugar TID 100 each 0   Insulin  Syringe-Needle U-100 (INSULIN  SYRINGE .3CC/30GX5/16) 30G X 5/16 0.3 ML MISC Use for insulin  administration three times a day 300 each 3   lipase/protease/amylase (CREON ) 36000 UNITS CPEP capsule TAKE TWO CAPSULES BY MOUTH THREE TIMES A DAY WITH MEALS. ALSO MAY TAKE ONE CAPSULE AS NEEDED WITH SNACKS (UP TO 4 SNACKS PER DAY) 200 capsule 6   OLANZapine  (ZYPREXA ) 5 MG tablet Take 1 tablet (5 mg total) by mouth at bedtime. 30 tablet 4   pantoprazole  (PROTONIX ) 40 MG tablet Take 1 tablet (40 mg total) by mouth 2 (two) times daily before a meal. 180 tablet 2   saccharomyces boulardii (FLORASTOR) 250 MG capsule Take 250 mg by mouth 2 (two) times daily.     VITAMIN D, CHOLECALCIFEROL, PO Take by mouth 2 (two) times daily.     No current facility-administered medications on file prior to visit.    BP 136/82   Pulse (!) 55   Temp 97.8 F (36.6 C) (Oral)   Ht 5' 11 (1.803 m)   Wt 155 lb (70.3 kg)   SpO2 99%   BMI 21.62 kg/m        Objective:   Physical Exam Vitals and nursing note reviewed.  Constitutional:      Appearance: Normal appearance.  Skin:    General: Skin is warm and dry.      Neurological:     General: No focal deficit present.     Mental Status: He is alert and oriented to person, place, and time.  Psychiatric:        Mood and Affect: Mood normal.        Behavior: Behavior normal.        Thought Content: Thought content normal.        Judgment: Judgment normal.       Assessment & Plan:  1. Visit for  wound  check (Primary) - Wound has healed well.  - Follow up as needed  Jared Cahn, NP

## 2023-08-17 ENCOUNTER — Other Ambulatory Visit

## 2023-08-17 ENCOUNTER — Ambulatory Visit: Admitting: Adult Health

## 2023-08-17 ENCOUNTER — Inpatient Hospital Stay

## 2023-08-17 ENCOUNTER — Ambulatory Visit: Admitting: Hematology & Oncology

## 2023-08-17 ENCOUNTER — Encounter: Payer: Self-pay | Admitting: Adult Health

## 2023-08-17 NOTE — Telephone Encounter (Signed)
 FYI

## 2023-08-18 ENCOUNTER — Encounter: Payer: Self-pay | Admitting: Hematology & Oncology

## 2023-08-19 NOTE — Progress Notes (Incomplete)
 Radiation Oncology         [phone] ________________________________  Initial Outpatient Consultation  Name: Alexander [name] MRN: [id number]  Date: 08/20/2023  DOB: 04-26-1940  RR:Wjqsphzm, Darleene, NP  Alexander Maude SAUNDERS, MD   REFERRING PHYSICIAN: Timmy Maude SAUNDERS, MD  DIAGNOSIS: There were no encounter diagnoses.  Adenocarcinoma of the pancreas-stage Ib (T2N0M0) --clinical stage --BRCA (-), KRAS (+), HRD(-) --metastatic to lung   HISTORY OF PRESENT ILLNESS::Alexander [name] is a 83 y.o. male who is accompanied by ***. he is seen as a courtesy of Dr. Timmy for an opinion concerning radiation therapy as part of management for his recently diagnosed ***.   Prior to his diagnosis, he was previously established with Dr. 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 He presented to Dr. Timmy in August of 2024 for evaluation of jaundice (with a bilirubin of 12.5) and unintentional weight loss of approximately 30 pounds over the course of 3-4 months.   A CT AP was subsequently performed on 08/29.25 which demonstrated: a 3 cm pancreatic head mass concerning for adenocarcinoma with severe dorsal pancreatic duct dilation and prominent intrahepatic and extrahepatic biliary dilation extending down to the distal common bile duct in the vicinity of the pancreatic mass; a nonspecific 2.3 cm homogeneous hyperdense lesion of the left kidney posteriorly (with an internal density of 50 Hounsfield units) likely representing a complex cyst given the homogeneity. Other findings of potential clinical significance also included a: ***    He was subsequently sent to the hospital on 08/31/22 for management of jaundice    He was treated with 12 cycles of neoadjuvant chemotherapy consisting of FOLFIRINOX from 10/31/22 through 05/08/23  PREVIOUS RADIATION THERAPY: {EXAM; YES/NO:19492::No}  PAST MEDICAL HISTORY:  Past Medical History:  Diagnosis Date   Arthritis     minor; shoulders primarily (05/20/2013)   CAD (coronary artery disease)    a. 05/2013 - Chest pain/unstable angina and dynamic EKG changes showing cath showing atherosclerotic coronary artery disease manifested as diffuse ectasia, normal EF.   Diverticulosis of colon    GERD (gastroesophageal reflux disease)    GI bleed    secondary to Aspirin    Gout    Hematuria, microscopic    Hemorrhoids    Hyperglycemia    Hyperlipidemia    Hypertension    Lumbar back pain    Microalbuminuria    Overweight(278.02)    Pancreatic cancer (HCC) 09/22/2022   Polycythemia rubra vera (HCC)    Tubular adenoma of colon 07/2012    PAST SURGICAL HISTORY: Past Surgical History:  Procedure Laterality Date   BILIARY STENT PLACEMENT N/A 09/02/2022   Procedure: BILIARY STENT PLACEMENT;  Surgeon: Abran Norleen SAILOR, MD;  Location: [facility];  Service: Gastroenterology;  Laterality: N/A;   BILIARY STENT PLACEMENT  10/13/2022   Procedure: BILIARY STENT PLACEMENT;  Surgeon: Aneita Gwendlyn DASEN, MD;  Location: [facility] ENDOSCOPY;  Service: Gastroenterology;;   BIOPSY  09/11/2022   Procedure: BIOPSY;  Surgeon: Wilhelmenia Aloha Raddle., MD;  Location: [facility];  Service: Gastroenterology;;   CARDIAC CATHETERIZATION  05/20/2013   CATARACT EXTRACTION W/ INTRAOCULAR LENS  IMPLANT, BILATERAL Bilateral 2008   CYSTECTOMY  ~ 2000    SEBACEOUS cyst removed from between shoulder blades   ENDOSCOPIC RETROGRADE CHOLANGIOPANCREATOGRAPHY (ERCP) WITH PROPOFOL  N/A 09/02/2022   Procedure: ENDOSCOPIC RETROGRADE CHOLANGIOPANCREATOGRAPHY (ERCP) WITH PROPOFOL ;  Surgeon: Abran Norleen SAILOR, MD;  Location: [facility];  Service: Gastroenterology;  Laterality: N/A;   ENDOSCOPIC RETROGRADE CHOLANGIOPANCREATOGRAPHY (ERCP) WITH PROPOFOL  N/A 10/13/2022  Procedure: ENDOSCOPIC RETROGRADE CHOLANGIOPANCREATOGRAPHY (ERCP) WITH PROPOFOL ;  Surgeon: Aneita Gwendlyn DASEN, MD;  Location: [facility] ENDOSCOPY;  Service: Gastroenterology;  Laterality: N/A;    ESOPHAGOGASTRODUODENOSCOPY N/A 09/11/2022   Procedure: ESOPHAGOGASTRODUODENOSCOPY (EGD);  Surgeon: Wilhelmenia Aloha Raddle., MD;  Location: [facility];  Service: Gastroenterology;  Laterality: N/A;   EUS N/A 09/11/2022   Procedure: UPPER ENDOSCOPIC ULTRASOUND (EUS) RADIAL;  Surgeon: Wilhelmenia Aloha Raddle., MD;  Location: [facility];  Service: Gastroenterology;  Laterality: N/A;   EXCISIONAL HEMORRHOIDECTOMY  1970's   FINE NEEDLE ASPIRATION N/A 09/11/2022   Procedure: FINE NEEDLE ASPIRATION (FNA) LINEAR;  Surgeon: Wilhelmenia Aloha Raddle., MD;  Location: [facility];  Service: Gastroenterology;  Laterality: N/A;   INGUINAL HERNIA REPAIR Right ~ 2010   IR IMAGING GUIDED PORT INSERTION  10/27/2022   LEFT HEART CATHETERIZATION WITH CORONARY ANGIOGRAM N/A 05/20/2013   Procedure: LEFT HEART CATHETERIZATION WITH CORONARY ANGIOGRAM;  Surgeon: Peter M Swaziland, MD;  [facility] CATH LAB;  Service: Cardiovascular;  Laterality: N/A;   SPHINCTEROTOMY  09/02/2022   Procedure: SPHINCTEROTOMY;  Surgeon: Abran Norleen SAILOR, MD;  Location: [facility];  Service: Gastroenterology;;   CLEDA REMOVAL  10/13/2022   Procedure: STENT REMOVAL;  Surgeon: Aneita Gwendlyn DASEN, MD;  Location: [facility] ENDOSCOPY;  Service: Gastroenterology;;   TONSILLECTOMY AND ADENOIDECTOMY  1940's   VASECTOMY      FAMILY HISTORY:  Family History  Problem Relation Age of Onset   Colon cancer Mother        died at 69, colorectal   Diabetes Mother    Heart disease Father 69       MI   Diabetes Father    Stomach cancer Maternal Grandfather    Cancer Maternal Uncle     SOCIAL HISTORY:  Social History   Tobacco Use   Smoking status: Never   Smokeless tobacco: Never   Tobacco comments:    NEVER USED TOBACCO  Vaping Use   Vaping status: Never Used  Substance Use Topics   Alcohol use: No    Alcohol/week: 0.0 standard drinks of alcohol   Drug use: No    ALLERGIES:  Allergies  Allergen Reactions   Metformin  And Related Diarrhea    Metformin  Hcl Diarrhea    MEDICATIONS:  Current Outpatient Medications  Medication Sig Dispense Refill   Accu-Chek Softclix Lancets lancets Use to test blood glucose once daily 100 each 3   amLODipine  (NORVASC ) 10 MG tablet Take 1 tablet (10 mg total) by mouth daily. 90 tablet 3   amoxicillin  (AMOXIL ) 500 MG capsule Take four capsules (2000 mg) one hour prior to dental procedure. 4 capsule 0   BD PEN NEEDLE NANO 2ND GEN 32G X 4 MM MISC      Continuous Glucose Sensor (FREESTYLE LIBRE 3 PLUS SENSOR) MISC Change sensor every 15 days. 2 each 6   Glucagon  (GVOKE HYPOPEN  1-PACK) 1 MG/0.2ML SOAJ Inject 1 mg into the skin as needed (low blood sugar with impaired consciousness). 0.4 mL 2   glucose blood (EMBRACE PRO GLUCOSE TEST) test strip Use as instructed 100 each 12   insulin  aspart (NOVOLOG  FLEXPEN) 100 UNIT/ML FlexPen 2-3 units with meals 3 times a day plus sliding scale as instructed, maximum 20units/day (Patient taking differently: 5-6 units with meals 3 times a day plus sliding scale as instructed, maximum 20units/day) 15 mL 11   insulin  glargine (LANTUS  SOLOSTAR) 100 UNIT/ML Solostar Pen INJECT 20 UNITS INTO THE SKIN 2 TIMES A DAY (Patient taking differently: INJECT 16 UNITS INTO THE SKIN daily) 12  mL 0   insulin  lispro (HUMALOG  KWIKPEN) 100 UNIT/ML KwikPen 2-3 units with meals 3 times a day plus sliding scale as instructed, maximum 20 units / day. 15 mL 11   Insulin  Pen Needle (DROPLET PEN NEEDLES) 31G X 6 MM MISC Use to check blood sugar TID 100 each 0   Insulin  Syringe-Needle U-100 (INSULIN  SYRINGE .3CC/30GX5/16) 30G X 5/16 0.3 ML MISC Use for insulin  administration three times a day 300 each 3   lipase/protease/amylase (CREON ) 36000 UNITS CPEP capsule TAKE TWO CAPSULES BY MOUTH THREE TIMES A DAY WITH MEALS. ALSO MAY TAKE ONE CAPSULE AS NEEDED WITH SNACKS (UP TO 4 SNACKS PER DAY) 200 capsule 6   OLANZapine  (ZYPREXA ) 5 MG tablet Take 1 tablet (5 mg total) by mouth at bedtime. 30 tablet 4    pantoprazole  (PROTONIX ) 40 MG tablet Take 1 tablet (40 mg total) by mouth 2 (two) times daily before a meal. 180 tablet 2   saccharomyces boulardii (FLORASTOR) 250 MG capsule Take 250 mg by mouth 2 (two) times daily.     VITAMIN D, CHOLECALCIFEROL, PO Take by mouth 2 (two) times daily.     No current facility-administered medications for this encounter.    REVIEW OF SYSTEMS:  A 10+ POINT REVIEW OF SYSTEMS WAS OBTAINED including neurology, dermatology, psychiatry, cardiac, respiratory, lymph, extremities, GI, GU, musculoskeletal, constitutional, reproductive, HEENT. ***   PHYSICAL EXAM:  vitals were not taken for this visit.   General: Alert and oriented, in no acute distress HEENT: Head is normocephalic. Extraocular movements are intact. Oropharynx is clear. Neck: Neck is supple, no palpable cervical or supraclavicular lymphadenopathy. Heart: Regular in rate and rhythm with no murmurs, rubs, or gallops. Chest: Clear to auscultation bilaterally, with no rhonchi, wheezes, or rales. Abdomen: Soft, nontender, nondistended, with no rigidity or guarding. Extremities: No cyanosis or edema. Lymphatics: see Neck Exam Skin: No concerning lesions. Musculoskeletal: symmetric strength and muscle tone throughout. Neurologic: Cranial nerves II through XII are grossly intact. No obvious focalities. Speech is fluent. Coordination is intact. Psychiatric: Judgment and insight are intact. Affect is appropriate. ***  ECOG = ***  0 - Asymptomatic (Fully active, able to carry on all predisease activities without restriction)  1 - Symptomatic but completely ambulatory (Restricted in physically strenuous activity but ambulatory and able to carry out work of a light or sedentary nature. For example, light housework, office work)  2 - Symptomatic, <50% in bed during the day (Ambulatory and capable of all self care but unable to carry out any work activities. Up and about more than 50% of waking hours)  3 -  Symptomatic, >50% in bed, but not bedbound (Capable of only limited self-care, confined to bed or chair 50% or more of waking hours)  4 - Bedbound (Completely disabled. Cannot carry on any self-care. Totally confined to bed or chair)  5 - Death   Raylene MM, Creech RH, Tormey DC, et al. [phone]). Toxicity and response criteria of the [facility]. Am. DOROTHA Bridges. Oncol. 5 (6): 649-55  LABORATORY DATA:  Lab Results  Component Value Date   WBC 10.2 08/08/2023   HGB 12.6 (L) 08/08/2023   HCT 37.7 (L) 08/08/2023   MCV 96.4 08/08/2023   PLT 162 08/08/2023   NEUTROABS 7.3 08/08/2023   Lab Results  Component Value Date   NA 138 08/08/2023   K 4.1 08/08/2023   CL 103 08/08/2023   CO2 24 08/08/2023   GLUCOSE 231 (H) 08/08/2023   BUN 17 08/08/2023  CREATININE 1.04 08/08/2023   CALCIUM  8.9 08/08/2023      RADIOGRAPHY: No results found.    IMPRESSION: {diagnosis}  ***  Today, I talked to the patient and family about the findings and work-up thus far.  We discussed the natural history of *** and general treatment, highlighting the role of radiotherapy in the management.  We discussed the available radiation techniques, and focused on the details of logistics and delivery.  We reviewed the anticipated acute and late sequelae associated with radiation in this setting.  The patient was encouraged to ask questions that I answered to the best of my ability. *** A patient consent form was discussed and signed.  We retained a copy for our records.  The patient would like to proceed with radiation and will be scheduled for CT simulation.  PLAN: ***    *** minutes of total time was spent for this patient encounter, including preparation, face-to-face counseling with the patient and coordination of care, physical exam, and documentation of the encounter.   ------------------------------------------------  Lynwood CHARM Nasuti, PhD, MD  This document serves as a record of services  personally performed by Lynwood Nasuti, MD. It was created on his behalf by Dorthy Fuse, a trained medical scribe. The creation of this record is based on the scribe's personal observations and the provider's statements to them. This document has been checked and approved by the attending provider.

## 2023-08-20 ENCOUNTER — Other Ambulatory Visit

## 2023-08-20 ENCOUNTER — Ambulatory Visit
Admission: RE | Admit: 2023-08-20 | Discharge: 2023-08-20 | Disposition: A | Discharge status: Home / Self Care | Source: Ambulatory Visit | Attending: Radiation Oncology | Admitting: Radiation Oncology

## 2023-08-20 ENCOUNTER — Inpatient Hospital Stay

## 2023-08-20 ENCOUNTER — Ambulatory Visit: Admitting: Hematology & Oncology

## 2023-08-20 ENCOUNTER — Ambulatory Visit
Admission: RE | Admit: 2023-08-20 | Discharge: 2023-08-20 | Discharge status: Home / Self Care | Source: Ambulatory Visit | Attending: Radiation Oncology | Admitting: Radiation Oncology

## 2023-08-20 ENCOUNTER — Encounter: Payer: Self-pay | Admitting: Radiation Oncology

## 2023-08-20 VITALS — BP 150/89 | HR 64 | Temp 97.9°F | Resp 20 | Ht 71.0 in | Wt 155.0 lb

## 2023-08-20 DIAGNOSIS — D45 Polycythemia vera: Secondary | ICD-10-CM | POA: Insufficient documentation

## 2023-08-20 DIAGNOSIS — I10 Essential (primary) hypertension: Secondary | ICD-10-CM | POA: Diagnosis not present

## 2023-08-20 DIAGNOSIS — R634 Abnormal weight loss: Secondary | ICD-10-CM | POA: Insufficient documentation

## 2023-08-20 DIAGNOSIS — C250 Malignant neoplasm of head of pancreas: Secondary | ICD-10-CM | POA: Insufficient documentation

## 2023-08-20 DIAGNOSIS — Z794 Long term (current) use of insulin: Secondary | ICD-10-CM | POA: Insufficient documentation

## 2023-08-20 DIAGNOSIS — Z800 Family history of malignant neoplasm of digestive organs: Secondary | ICD-10-CM | POA: Diagnosis not present

## 2023-08-20 DIAGNOSIS — Z79899 Other long term (current) drug therapy: Secondary | ICD-10-CM | POA: Insufficient documentation

## 2023-08-20 DIAGNOSIS — Z860101 Personal history of adenomatous and serrated colon polyps: Secondary | ICD-10-CM | POA: Insufficient documentation

## 2023-08-20 DIAGNOSIS — Z510 Encounter for antineoplastic radiation therapy: Secondary | ICD-10-CM | POA: Insufficient documentation

## 2023-08-20 DIAGNOSIS — M5459 Other low back pain: Secondary | ICD-10-CM | POA: Diagnosis not present

## 2023-08-20 DIAGNOSIS — R918 Other nonspecific abnormal finding of lung field: Secondary | ICD-10-CM | POA: Diagnosis not present

## 2023-08-20 DIAGNOSIS — M129 Arthropathy, unspecified: Secondary | ICD-10-CM | POA: Insufficient documentation

## 2023-08-20 DIAGNOSIS — Z8719 Personal history of other diseases of the digestive system: Secondary | ICD-10-CM | POA: Diagnosis not present

## 2023-08-20 DIAGNOSIS — I2510 Atherosclerotic heart disease of native coronary artery without angina pectoris: Secondary | ICD-10-CM | POA: Insufficient documentation

## 2023-08-20 DIAGNOSIS — K219 Gastro-esophageal reflux disease without esophagitis: Secondary | ICD-10-CM | POA: Insufficient documentation

## 2023-08-20 DIAGNOSIS — C7801 Secondary malignant neoplasm of right lung: Secondary | ICD-10-CM | POA: Diagnosis not present

## 2023-08-20 DIAGNOSIS — E785 Hyperlipidemia, unspecified: Secondary | ICD-10-CM | POA: Insufficient documentation

## 2023-08-20 DIAGNOSIS — Z9221 Personal history of antineoplastic chemotherapy: Secondary | ICD-10-CM | POA: Diagnosis not present

## 2023-08-20 MED ORDER — SODIUM CHLORIDE 0.9% FLUSH: 10.0000 mL | INTRAVENOUS | Status: DC | PRN

## 2023-08-20 NOTE — Progress Notes (Signed)
 Has armband been applied?  Yes.    Does patient have an allergy to IV contrast dye?: No.   Has patient ever received premedication for IV contrast dye?: No.   Date of lab work: August 08, 2023 BUN: 17 CR: 1.04 eGFR: >60  Does patient take metformin ?: No.  Is eGFR >60?: Yes.   If no, when can patient resume? (Must be 48 hrs AFTER they receive IV contrast):    IV site: Port a cath  Has IV site been added to flowsheet?  Yes.    BP (!) 150/89 (BP Location: Left Arm, Patient Position: Sitting, Cuff Size: Large)   Pulse 64   Temp 97.9 F (36.6 C)   Resp 20   Ht 5' 11 (1.803 m)   Wt 155 lb (70.3 kg)   SpO2 99%   BMI 21.62 kg/m

## 2023-08-20 NOTE — Progress Notes (Signed)
 Radiation Oncology         [phone] ________________________________  Initial Outpatient Consultation  Name: Alexander [name] MRN: [id number]  Date: 08/20/2023  DOB: 1940/02/04  RR:Wjqsphzm, Darleene, NP  Timmy Maude SAUNDERS, MD   REFERRING PHYSICIAN: Timmy Maude SAUNDERS, MD  DIAGNOSIS: The encounter diagnosis was Malignant neoplasm of head of pancreas [facility]).  Adenocarcinoma of the pancreas-stage Ib (T2N0M0); BRCA (-), KRAS (+), HRD(-), now with metastatic disease to the right lower lung: s/p neoadjuvant chemotherapy followed by RLL wedge resection   Cancer Staging  Pancreatic cancer [facility]) Staging form: Exocrine Pancreas, AJCC 8th Edition - Clinical stage from 09/22/2022: Stage IB (cT2, cN0, cM0) - Signed by Timmy Maude SAUNDERS, MD on 09/22/2022   HISTORY OF PRESENT ILLNESS::Alexander [name] is a 83 y.o. male who is seen as a courtesy of Dr. Timmy for an opinion concerning radiation therapy as part of management for his recently diagnosed pancreatic cancer with pulmonary metastases.   Prior to his diagnosis, he was previously established with Dr. 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 He presented to Dr. Timmy in August of 2024 for evaluation of jaundice (with a bilirubin of 12.5) and unintentional weight loss of approximately 30 pounds over the course of 3-4 months.   A CT AP was subsequently performed on 08/29.25 which demonstrated: a 3 cm pancreatic head mass concerning for adenocarcinoma with severe dorsal pancreatic duct dilation and prominent intrahepatic and extrahepatic biliary dilation extending down to the distal common bile duct in the vicinity of the pancreatic mass; a nonspecific 2.3 cm homogeneous hyperdense lesion of the left kidney posteriorly (with an internal density of 50 Hounsfield units) likely representing a complex cyst given the homogeneity.   He was subsequently sent to the hospital on 08/31/22 for management of jaundice.  An MRI of the abdomen and pelvis was then performed while inpatient on 09/01/22 which demonstrated: the small ill defined subtly diffusion restricting and hypoenhancing mass in the inferior pancreatic head and uncinate, measuring approximately 2.5 x 2.0 x 1.9 cm concerning for pancreatic adenocarcinoma; abrupt truncation of the central pancreatic duct with severe pancreatic ductal dilatation and atrophy of the pancreas distally; severe intra and extrahepatic biliary ductal dilatation, with the common bile duct being abruptly truncated within the central pancreatic head. Imaging otherwise showed no evidence of lymphadenopathy or organ metastatic disease in the abdomen.  GI was accordingly consulted and performed an ERCP with sphincterotomy and biliary stent placement on 09/02/22. Exam findings included a malignant appearing distal biliary stricture with concern for pancreatic cancer noted.   Following discharge, he was accordingly referred to GI and opted to proceed with an EUS with biopsies on the pancreatic mass on 09/11/23 under the care of Dr. Wilhelmenia. FNA of the pancreatic mass showed findings consistent with adenocarcinoma. A stomach biopsy was also obtained which came back negative for malignancy or H. Pylori.   A staging CT CAP was then performed on 10/04/22 which demonstrated: the known pancreatic head mass measuring approximately 3.6 x 2.5 x 2.5 cm and contacting (but not encasing) the superior mesenteric vein near the portal venous confluence; a small finger-like a prior projection (from the mass) of soft tissue extending up to and potentially contacting the superior mesenteric artery without encasement; and a 1.2 cm non solid nodule within the posterolateral right lower lobe. Imaging otherwise showed no evidence of metastatic disease within the abdomen or pelvis.     He was also admitted and underwent a stent change w/ ERCP on  10/13/22 in the setting of abdominal pain and his pancreatic tumor.  Procedure findings included: a single severe malignancy appearing biliary stricture in the lower third of the main bile duct, and marked dilation of the common bile duct proximal to the structure resulting in an obstruction.  He wad accordingly referred to Dr. Timmy and was treated with 12 cycles of neoadjuvant chemotherapy consisting of FOLFIRINOX from 10/31/22 through 05/08/23.   Follow-up imaging performed throughout the course of his systemic therapy includes:  -- A CT AP with contrast on 01/29/23 which demonstrated: the pancreatic head mass measuring 4.5 cm with the extrahepatic biliary stent in place. The mass again appeared to abut the superior mesenteric vein giving a teardrop deformity to the level of the first order branch. In addition, soft tissue thickening and loss of fat plane of the superior mesenteric artery just proximal to the first order jejunal branch was also present concerning for either posttreatment change or residual tumor.   -- Chest CT on 01/29/23 showed: a slight increase in size of 3 pleural-based nonsolid nodules within the lower lobe of the right lung.  After completing chemotherapy, a restaging CT AP was performed on 05/14/23 which demonstrated: stability of the pancreatic head mass measuring 4.6 cm, with unchanged soft tissue abutment of the SMA and proximal SMV. CT otherwise showed no evidence of metastatic disease in the abdomen or pelvis. A chest CT was also performed that day which showed an increase in size of the juxtapleural nodules in the right lower lobe measuring up to 1.2 cm.   Given his progressive lung findings, a repeat chest CT was performed on 06/11/23 which showed a minimal change in size of the multiple nonspecific pleural-based nodular findings within the lower lobe of the right lung. The most concerning lesion noted was located in the medial aspect of the superior segment. A second nodule along the major fissure was favored to represent a benign  subpleural lymph node. A relatively linear lesion involving the lateral basilar segment was also demonstrated and favored to reflect benign changes.   He was seen by surgical oncology at [facility] and biopsies of the RLL nodule were recommended. FNA of the medial RLL nodule and inferior/lateral RLL nodule on 06/14/23 both showed atypical mucinous cells and abundant extracellular mucin - primary or metastatic mucinous carcinoma could not be entirely excluded. RLL lavage was also submitted for cytology and showed no evidence of malignancy.    Follow-up CT AP on 07/10/23 showed a slight interval decrease in size of the pancreatic mass abutting the SMA and SMV (without definite invasion), and stable appearing severe pancreatic atrophy and pancreatic ductal dilation. Imaging findings otherwise showed no evidence of metastatic disease in the abdomen or pelvis.   His most recent chest CT was also performed that same date (07/08) and showed a similar to slight increase in size of the RLL nodules, characterized by: stability of the 1.0 cm cm subpleural superior segment nodule; a slight increase in size of the posterior basal segment nodule, measuring 0.8 cm (previously measuring 0.7 cm); and a slight increase in size of the anterior basal segment nodule measuring 0.8 cm, previously measuring 0.7 cm.      He accordingly returned to [facility] surgical oncology for further management and opted to proceed with a thoracoscopy w/ wedge resection of the RLL on 08/03/23 under the care of Dr. Darra. Pathology from the procedure showed: mucinous intestinal type adenocarcinoma measuring 1.5 cm (subpleural) consistent with metastatic adenocarcinoma from a pancreatic primary;  all margins negative for carcinoma.    Given his metastatic disease, Dr. Timmy does not advise pursuing resection of the pancreatic mass and has recommended chemoradiation which we will discuss in detail today.  Patient denies any abdominal pain, nausea,  vomiting, loose stools, or any other changes to his bowel habits. He lost his taste from his chemotherapy, and states this has started to return. 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   PREVIOUS RADIATION THERAPY: No  PAST MEDICAL HISTORY:  Past Medical History:  Diagnosis Date   Arthritis    minor; shoulders primarily (05/20/2013)   CAD (coronary artery disease)    a. 05/2013 - Chest pain/unstable angina and dynamic EKG changes showing cath showing atherosclerotic coronary artery disease manifested as diffuse ectasia, normal EF.   Diverticulosis of colon    GERD (gastroesophageal reflux disease)    GI bleed    secondary to Aspirin    Gout    Hematuria, microscopic    Hemorrhoids    Hyperglycemia    Hyperlipidemia    Hypertension    Lumbar back pain    Microalbuminuria    Overweight(278.02)    Pancreatic cancer (HCC) 09/22/2022   Polycythemia rubra vera (HCC)    Tubular adenoma of colon 07/2012    PAST SURGICAL HISTORY: Past Surgical History:  Procedure Laterality Date   BILIARY STENT PLACEMENT N/A 09/02/2022   Procedure: BILIARY STENT PLACEMENT;  Surgeon: Abran Norleen SAILOR, MD;  Location: [facility];  Service: Gastroenterology;  Laterality: N/A;   BILIARY STENT PLACEMENT  10/13/2022   Procedure: BILIARY STENT PLACEMENT;  Surgeon: Aneita Gwendlyn DASEN, MD;  Location: [facility] ENDOSCOPY;  Service: Gastroenterology;;   BIOPSY  09/11/2022   Procedure: BIOPSY;  Surgeon: Wilhelmenia Aloha Raddle., MD;  Location: [facility];  Service: Gastroenterology;;   CARDIAC CATHETERIZATION  05/20/2013   CATARACT EXTRACTION W/ INTRAOCULAR LENS  IMPLANT, BILATERAL Bilateral 2008   CYSTECTOMY  ~ 2000    SEBACEOUS cyst removed from between shoulder blades   ENDOSCOPIC RETROGRADE CHOLANGIOPANCREATOGRAPHY (ERCP) WITH PROPOFOL  N/A 09/02/2022   Procedure: ENDOSCOPIC RETROGRADE CHOLANGIOPANCREATOGRAPHY (ERCP) WITH PROPOFOL ;  Surgeon: Abran Norleen SAILOR, MD;   Location: [facility];  Service: Gastroenterology;  Laterality: N/A;   ENDOSCOPIC RETROGRADE CHOLANGIOPANCREATOGRAPHY (ERCP) WITH PROPOFOL  N/A 10/13/2022   Procedure: ENDOSCOPIC RETROGRADE CHOLANGIOPANCREATOGRAPHY (ERCP) WITH PROPOFOL ;  Surgeon: Aneita Gwendlyn DASEN, MD;  Location: [facility] ENDOSCOPY;  Service: Gastroenterology;  Laterality: N/A;   ESOPHAGOGASTRODUODENOSCOPY N/A 09/11/2022   Procedure: ESOPHAGOGASTRODUODENOSCOPY (EGD);  Surgeon: Wilhelmenia Aloha Raddle., MD;  Location: [facility];  Service: Gastroenterology;  Laterality: N/A;   EUS N/A 09/11/2022   Procedure: UPPER ENDOSCOPIC ULTRASOUND (EUS) RADIAL;  Surgeon: Wilhelmenia Aloha Raddle., MD;  Location: [facility];  Service: Gastroenterology;  Laterality: N/A;   EXCISIONAL HEMORRHOIDECTOMY  1970's   FINE NEEDLE ASPIRATION N/A 09/11/2022   Procedure: FINE NEEDLE ASPIRATION (FNA) LINEAR;  Surgeon: Wilhelmenia Aloha Raddle., MD;  Location: [facility];  Service: Gastroenterology;  Laterality: N/A;   INGUINAL HERNIA REPAIR Right ~ 2010   IR IMAGING GUIDED PORT INSERTION  10/27/2022   LEFT HEART CATHETERIZATION WITH CORONARY ANGIOGRAM N/A 05/20/2013   Procedure: LEFT HEART CATHETERIZATION WITH CORONARY ANGIOGRAM;  Surgeon: Peter M Swaziland, MD;  Location: [facility] CATH LAB;  Service: Cardiovascular;  Laterality: N/A;   SPHINCTEROTOMY  09/02/2022   Procedure: SPHINCTEROTOMY;  Surgeon: Abran Norleen SAILOR, MD;  Location: [facility];  Service: Gastroenterology;;   CLEDA REMOVAL  10/13/2022   Procedure: STENT REMOVAL;  Surgeon: Aneita Gwendlyn DASEN, MD;  Location: [facility] ENDOSCOPY;  Service: Gastroenterology;;   TONSILLECTOMY AND ADENOIDECTOMY  1940's   VASECTOMY      FAMILY HISTORY:  Family History  Problem Relation Age of Onset   Colon cancer Mother        died at 45, colorectal   Diabetes Mother    Heart disease Father 29       MI   Diabetes Father    Stomach cancer Maternal Grandfather    Cancer Maternal Uncle     SOCIAL HISTORY:  Social History   Tobacco  Use   Smoking status: Never   Smokeless tobacco: Never   Tobacco comments:    NEVER USED TOBACCO  Vaping Use   Vaping status: Never Used  Substance Use Topics   Alcohol use: No    Alcohol/week: 0.0 standard drinks of alcohol   Drug use: No    ALLERGIES:  Allergies  Allergen Reactions   Metformin  And Related Diarrhea   Metformin  Hcl Diarrhea    MEDICATIONS:  Current Outpatient Medications  Medication Sig Dispense Refill   Accu-Chek Softclix Lancets lancets Use to test blood glucose once daily 100 each 3   amLODipine  (NORVASC ) 10 MG tablet Take 1 tablet (10 mg total) by mouth daily. 90 tablet 3   amoxicillin  (AMOXIL ) 500 MG capsule Take four capsules (2000 mg) one hour prior to dental procedure. 4 capsule 0   BD PEN NEEDLE NANO 2ND GEN 32G X 4 MM MISC      Continuous Glucose Sensor (FREESTYLE LIBRE 3 PLUS SENSOR) MISC Change sensor every 15 days. 2 each 6   Glucagon  (GVOKE HYPOPEN  1-PACK) 1 MG/0.2ML SOAJ Inject 1 mg into the skin as needed (low blood sugar with impaired consciousness). 0.4 mL 2   glucose blood (EMBRACE PRO GLUCOSE TEST) test strip Use as instructed 100 each 12   insulin  aspart (NOVOLOG  FLEXPEN) 100 UNIT/ML FlexPen 2-3 units with meals 3 times a day plus sliding scale as instructed, maximum 20units/day (Patient taking differently: 5-6 units with meals 3 times a day plus sliding scale as instructed, maximum 20units/day) 15 mL 11   insulin  glargine (LANTUS  SOLOSTAR) 100 UNIT/ML Solostar Pen INJECT 20 UNITS INTO THE SKIN 2 TIMES A DAY (Patient taking differently: INJECT 16 UNITS INTO THE SKIN daily) 12 mL 0   insulin  lispro (HUMALOG  KWIKPEN) 100 UNIT/ML KwikPen 2-3 units with meals 3 times a day plus sliding scale as instructed, maximum 20 units / day. 15 mL 11   Insulin  Pen Needle (DROPLET PEN NEEDLES) 31G X 6 MM MISC Use to check blood sugar TID 100 each 0   Insulin  Syringe-Needle U-100 (INSULIN  SYRINGE .3CC/30GX5/16) 30G X 5/16 0.3 ML MISC Use for insulin   administration three times a day 300 each 3   lipase/protease/amylase (CREON ) 36000 UNITS CPEP capsule TAKE TWO CAPSULES BY MOUTH THREE TIMES A DAY WITH MEALS. ALSO MAY TAKE ONE CAPSULE AS NEEDED WITH SNACKS (UP TO 4 SNACKS PER DAY) 200 capsule 6   OLANZapine  (ZYPREXA ) 5 MG tablet Take 1 tablet (5 mg total) by mouth at bedtime. 30 tablet 4   pantoprazole  (PROTONIX ) 40 MG tablet Take 1 tablet (40 mg total) by mouth 2 (two) times daily before a meal. 180 tablet 2   saccharomyces boulardii (FLORASTOR) 250 MG capsule Take 250 mg by mouth 2 (two) times daily.     VITAMIN D, CHOLECALCIFEROL, PO Take by mouth 2 (two) times daily.     Current Facility-Administered Medications  Medication  Dose Route Frequency Provider Last Rate Last Admin   sodium chloride  flush (NS) 0.9 % injection 10 mL  10 mL Intravenous PRN Wyatt Leeroy HERO, PA-C        REVIEW OF SYSTEMS:  Notable for that above.    PHYSICAL EXAM:  height is 5' 11 (1.803 m) and weight is 155 lb (70.3 kg). His temperature is 97.9 F (36.6 C). His blood pressure is 150/89 (abnormal) and his pulse is 64. His respiration is 20 and oxygen saturation is 99%.   General: Alert and oriented, in no acute distress HEENT: Head is normocephalic. Extraocular movements are intact. Oropharynx is clear. Neck: Neck is supple, no palpable cervical or supraclavicular lymphadenopathy. Heart: Regular in rate and rhythm with no murmurs, rubs, or gallops. Chest: Clear to auscultation bilaterally, with no rhonchi, wheezes, or rales. Abdomen: Soft, nontender, nondistended, with no rigidity or guarding. Extremities: No cyanosis or edema. Lymphatics: see Neck Exam Skin: No concerning lesions. Musculoskeletal: symmetric strength and muscle tone throughout. Neurologic: Cranial nerves II through XII are grossly intact. No obvious focalities. Speech is fluent. Coordination is intact. Psychiatric: Judgment and insight are intact. Affect is appropriate.   ECOG = 1  0 -  Asymptomatic (Fully active, able to carry on all predisease activities without restriction)  1 - Symptomatic but completely ambulatory (Restricted in physically strenuous activity but ambulatory and able to carry out work of a light or sedentary nature. For example, light housework, office work)  2 - Symptomatic, <50% in bed during the day (Ambulatory and capable of all self care but unable to carry out any work activities. Up and about more than 50% of waking hours)  3 - Symptomatic, >50% in bed, but not bedbound (Capable of only limited self-care, confined to bed or chair 50% or more of waking hours)  4 - Bedbound (Completely disabled. Cannot carry on any self-care. Totally confined to bed or chair)  5 - Death   Raylene MM, Creech RH, Tormey DC, et al. [phone]). Toxicity and response criteria of the [facility]. Am. DOROTHA Bridges. Oncol. 5 (6): 649-55  LABORATORY DATA:  Lab Results  Component Value Date   WBC 10.2 08/08/2023   HGB 12.6 (L) 08/08/2023   HCT 37.7 (L) 08/08/2023   MCV 96.4 08/08/2023   PLT 162 08/08/2023   NEUTROABS 7.3 08/08/2023   Lab Results  Component Value Date   NA 138 08/08/2023   K 4.1 08/08/2023   CL 103 08/08/2023   CO2 24 08/08/2023   GLUCOSE 231 (H) 08/08/2023   BUN 17 08/08/2023   CREATININE 1.04 08/08/2023   CALCIUM  8.9 08/08/2023      RADIOGRAPHY: No results found.    IMPRESSION: Adenocarcinoma of the pancreas-stage Ib (T2N0M0); BRCA (-), KRAS (+), HRD(-), now with metastatic disease to the right lower lung: s/p neoadjuvant chemotherapy followed by RLL wedge resection  We have reviewed this patient's current work-up. Patient presents today with metastatic pancreatic carcinoma after completing neoadjuvant chemotherapy. Pathology from patient's RLL wedge resection confirmed metastatic cancer to his lungs. There is no other evidence of metastatic disease. He is not a candidate for surgery at this time. SBRT could be considered in this  case; however, Dr. Timmy is hopeful the addition of systemic treatment will help prevent further disease progression. Dr. Shannon recommends radiation given concurrently with chemotherapy to the pancreatic mass.  Today, I talked to the patient and family about the findings and work-up thus far.  We discussed the natural history  of unresectable pancreatic carcinoma and general treatment, highlighting the role of radiotherapy in the management.  We discussed the available radiation techniques, and focused on the details of logistics and delivery.  We reviewed the anticipated acute and late sequelae associated with radiation in this setting.  The patient was encouraged to ask questions that I answered to the best of my ability.  A patient consent form was discussed and signed.  We retained a copy for our records.  The patient would like to proceed with radiation and will be scheduled for CT simulation.  PLAN: Patient is scheduled for CT simulation later today. Dr. Shannon anticipates 28 fractions of radiation given concurrently with chemotherapy. We will coordinate radiation treatments with his chemotherapy. We look forward to participating in this patient's care.    60 minutes of total time was spent for this patient encounter, including preparation, face-to-face counseling with the patient and coordination of care, physical exam, and documentation of the encounter.   ------------------------------------------------   Leeroy Due, PA-C   Lynwood CHARM Shannon, PhD, MD   [facility]  Radiation Oncology Direct Dial : [phone]  Fax: [phone] [email or web address]    This document serves as a record of services personally performed by Lynwood Shannon, MD. It was created on his behalf by Dorthy Fuse, a trained medical scribe. The creation of this record is based on the scribe's personal observations and the provider's statements to them. This document has been checked and approved by the attending  provider.

## 2023-08-21 ENCOUNTER — Ambulatory Visit: Admitting: Adult Health

## 2023-08-24 NOTE — Telephone Encounter (Signed)
 Confirmed with Pt. that we did discussed the following on 08/07/23 the following instructions: no swimming or Jacuzzi until post op appointment and until surgical sites are completed healed. Pt. confirmed postop appointment on 08/27/24 and states she has no additional questions or concerns.   Benton Fell RN, [employer] Surgery Phone: [phone] Pager: [phone]

## 2023-08-27 ENCOUNTER — Inpatient Hospital Stay

## 2023-08-27 ENCOUNTER — Inpatient Hospital Stay (HOSPITAL_BASED_OUTPATIENT_CLINIC_OR_DEPARTMENT_OTHER): Admitting: Hematology & Oncology

## 2023-08-27 ENCOUNTER — Encounter: Payer: Self-pay | Admitting: Hematology & Oncology

## 2023-08-27 ENCOUNTER — Other Ambulatory Visit: Payer: Self-pay

## 2023-08-27 ENCOUNTER — Encounter: Payer: Self-pay | Admitting: *Deleted

## 2023-08-27 VITALS — BP 120/70 | HR 55 | Temp 97.9°F | Resp 18 | Ht 71.0 in | Wt 156.0 lb

## 2023-08-27 VITALS — BP 142/81 | HR 58

## 2023-08-27 DIAGNOSIS — Z5111 Encounter for antineoplastic chemotherapy: Secondary | ICD-10-CM | POA: Diagnosis not present

## 2023-08-27 DIAGNOSIS — C252 Malignant neoplasm of tail of pancreas: Secondary | ICD-10-CM

## 2023-08-27 DIAGNOSIS — Z510 Encounter for antineoplastic radiation therapy: Secondary | ICD-10-CM | POA: Diagnosis not present

## 2023-08-27 DIAGNOSIS — C250 Malignant neoplasm of head of pancreas: Secondary | ICD-10-CM | POA: Diagnosis not present

## 2023-08-27 LAB — CBC WITH DIFFERENTIAL (CANCER CENTER ONLY)
Abs Immature Granulocytes: 0.06 K/uL (ref 0.00–0.07)
Basophils Absolute: 0 K/uL (ref 0.0–0.1)
Basophils Relative: 1 %
Eosinophils Absolute: 0.3 K/uL (ref 0.0–0.5)
Eosinophils Relative: 3 %
HCT: 39.4 % (ref 39.0–52.0)
Hemoglobin: 12.9 g/dL — ABNORMAL LOW (ref 13.0–17.0)
Immature Granulocytes: 1 %
Lymphocytes Relative: 18 %
Lymphs Abs: 1.6 K/uL (ref 0.7–4.0)
MCH: 31.5 pg (ref 26.0–34.0)
MCHC: 32.7 g/dL (ref 30.0–36.0)
MCV: 96.1 fL (ref 80.0–100.0)
Monocytes Absolute: 1.1 K/uL — ABNORMAL HIGH (ref 0.1–1.0)
Monocytes Relative: 12 %
Neutro Abs: 5.7 K/uL (ref 1.7–7.7)
Neutrophils Relative %: 65 %
Platelet Count: 186 K/uL (ref 150–400)
RBC: 4.1 MIL/uL — ABNORMAL LOW (ref 4.22–5.81)
RDW: 13.3 % (ref 11.5–15.5)
WBC Count: 8.7 K/uL (ref 4.0–10.5)
nRBC: 0 % (ref 0.0–0.2)

## 2023-08-27 LAB — CMP (CANCER CENTER ONLY)
ALT: 14 U/L (ref 0–44)
AST: 17 U/L (ref 15–41)
Albumin: 3.9 g/dL (ref 3.5–5.0)
Alkaline Phosphatase: 119 U/L (ref 38–126)
Anion gap: 11 (ref 5–15)
BUN: 16 mg/dL (ref 8–23)
CO2: 24 mmol/L (ref 22–32)
Calcium: 9 mg/dL (ref 8.9–10.3)
Chloride: 104 mmol/L (ref 98–111)
Creatinine: 1.03 mg/dL (ref 0.61–1.24)
GFR, Estimated: >60 mL/min (ref >60)
Glucose, Bld: 161 mg/dL — ABNORMAL HIGH (ref 70–99)
Potassium: 4.5 mmol/L (ref 3.5–5.1)
Sodium: 139 mmol/L (ref 135–145)
Total Bilirubin: 0.5 mg/dL (ref 0.0–1.2)
Total Protein: 6.6 g/dL (ref 6.5–8.1)

## 2023-08-27 MED ORDER — SODIUM CHLORIDE 0.9 % IV SOLN
500.0000 mg/m2 | Freq: Once | INTRAVENOUS | Status: AC
  Administered 2023-08-27: 912 mg via INTRAVENOUS
  Filled 2023-08-27: qty 23.99

## 2023-08-27 MED ORDER — SODIUM CHLORIDE 0.9 % IV SOLN: INTRAVENOUS | Status: DC

## 2023-08-27 MED ORDER — PROCHLORPERAZINE MALEATE 10 MG PO TABS
10.0000 mg | ORAL_TABLET | Freq: Once | ORAL | Status: AC
  Administered 2023-08-27: 10 mg via ORAL
  Filled 2023-08-27: qty 1

## 2023-08-27 MED ORDER — PROCHLORPERAZINE MALEATE 10 MG PO TABS: 10.0000 mg | ORAL_TABLET | Freq: Four times a day (QID) | ORAL | 1 refills | Status: DC | PRN

## 2023-08-27 MED ORDER — ONDANSETRON HCL 8 MG PO TABS
8.0000 mg | ORAL_TABLET | Freq: Three times a day (TID) | ORAL | 1 refills | Status: DC | PRN
Start: 2023-08-27 — End: 2023-09-15

## 2023-08-27 MED ORDER — LIDOCAINE-PRILOCAINE 2.5-2.5 % EX CREA: TOPICAL_CREAM | CUTANEOUS | 3 refills | Status: DC

## 2023-08-27 NOTE — Progress Notes (Signed)
 Hematology and Oncology Follow Up Visit  Alexander [name] [id number] December 02, 1940 83 y.o. 08/27/2023   Principle Diagnosis:  Adenocarcinoma of the pancreas-stage Ib (T2N0M0) --clinical stage --BRCA (-), KRAS (+), HRD(-) --metastatic to lung   Current Therapy:        Status post biliary stent placement FOLFIRINOX -- Neoadj - s/p cycle #12/12  - start on 10/31/2022 --completed on 05/08/2023 XRT + Gemzar  -- start on 08/27/2023   Interim History:  Alexander [name] is here today for follow-up.  Unfortunately, he does have metastatic disease.  We will go ahead and treat now with radiation and chemotherapy.  This will be to the pancreas.  I will give him weekly gemcitabine .  I think this would be very reasonable to do.  He feels well.  His poor wife is having problems with migraines.  I do feel bad for her.  He has had no issues with nausea or vomiting.  Has had no change in bowel or bladder habits.  He has had no bleeding.  Is been no rashes.  He has had no leg swelling.  He has had no mouth sores.  Overall, I would have to say that his performance status is [name].    Medications:  Allergies as of 08/27/2023       Reactions   Metformin  And Related Diarrhea   Metformin  Hcl Diarrhea        Medication List        Accurate as of August 27, 2023 12:26 PM. If you have any questions, ask your nurse or doctor.          Accu-Chek Softclix Lancets lancets Use to test blood glucose once daily   amLODipine  10 MG tablet Commonly known as: NORVASC  Take 1 tablet (10 mg total) by mouth daily.   amoxicillin  500 MG capsule Commonly known as: AMOXIL  Take four capsules (2000 mg) one hour prior to dental procedure.   Creon  36000-114000 units Cpep capsule Generic drug: lipase/protease/amylase TAKE TWO CAPSULES BY MOUTH THREE TIMES A DAY WITH MEALS. ALSO MAY TAKE ONE CAPSULE AS NEEDED WITH SNACKS (UP TO 4 SNACKS PER DAY)   Droplet Pen Needles 31G X 6 MM Misc Generic drug: Insulin  Pen  Needle Use to check blood sugar TID   BD Pen Needle Nano 2nd Gen 32G X 4 MM Misc Generic drug: Insulin  Pen Needle   Embrace Pro Glucose Test test strip Generic drug: glucose blood Use as instructed   FreeStyle Libre 3 Plus Sensor Misc Change sensor every 15 days.   Gvoke HypoPen  1-Pack 1 MG/0.2ML Soaj Generic drug: Glucagon  Inject 1 mg into the skin as needed (low blood sugar with impaired consciousness).   insulin  lispro 100 UNIT/ML KwikPen Commonly known as: HumaLOG  KwikPen 2-3 units with meals 3 times a day plus sliding scale as instructed, maximum 20 units / day.   INSULIN  SYRINGE .3CC/30GX5/16 30G X 5/16 0.3 ML Misc Use for insulin  administration three times a day   Lantus  SoloStar 100 UNIT/ML Solostar Pen Generic drug: insulin  glargine INJECT 20 UNITS INTO THE SKIN 2 TIMES A DAY What changed: additional instructions   lidocaine -prilocaine  cream Commonly known as: EMLA  Apply to affected area once   NovoLOG  FlexPen 100 UNIT/ML FlexPen Generic drug: insulin  aspart 2-3 units with meals 3 times a day plus sliding scale as instructed, maximum 20units/day What changed: additional instructions   OLANZapine  5 MG tablet Commonly known as: ZYPREXA  Take 1 tablet (5 mg total) by mouth at bedtime.   ondansetron  8  MG tablet Commonly known as: Zofran  Take 1 tablet (8 mg total) by mouth every 8 (eight) hours as needed for nausea or vomiting.   pantoprazole  40 MG tablet Commonly known as: PROTONIX  Take 1 tablet (40 mg total) by mouth 2 (two) times daily before a meal.   prochlorperazine  10 MG tablet Commonly known as: COMPAZINE  Take 1 tablet (10 mg total) by mouth every 6 (six) hours as needed for nausea or vomiting.   saccharomyces boulardii 250 MG capsule Commonly known as: FLORASTOR Take 250 mg by mouth 2 (two) times daily.   VITAMIN D (CHOLECALCIFEROL) PO Take by mouth 2 (two) times daily.        Allergies:  Allergies  Allergen Reactions   Metformin  And  Related Diarrhea   Metformin  Hcl Diarrhea    Past Medical History, Surgical history, Social history, and Family History were reviewed and updated.  Review of Systems:  Review of Systems  Constitutional: Negative.   HENT: Negative.    Eyes: Negative.   Respiratory: Negative.    Cardiovascular: Negative.   Gastrointestinal: Negative.   Genitourinary: Negative.   Musculoskeletal: Negative.   Skin: Negative.   Neurological: Negative.   Endo/Heme/Allergies: Negative.   Psychiatric/Behavioral: Negative.       Physical Exam:  height is 5' 11 (1.803 m) and weight is 156 lb (70.8 kg). His oral temperature is 97.9 F (36.6 C). His blood pressure is 120/70 and his pulse is 55 (abnormal). His respiration is 18 and oxygen saturation is 100%.   Wt Readings from Last 3 Encounters:  08/27/23 156 lb (70.8 kg)  08/20/23 155 lb (70.3 kg)  08/15/23 155 lb (70.3 kg)    Physical Exam Vitals reviewed.  HENT:     Head: Normocephalic and atraumatic.  Eyes:     Pupils: Pupils are equal, round, and reactive to light.  Cardiovascular:     Rate and Rhythm: Normal rate and regular rhythm.     Heart sounds: Normal heart sounds.  Pulmonary:     Effort: Pulmonary effort is normal.     Breath sounds: Normal breath sounds.  Abdominal:     General: Bowel sounds are normal.     Palpations: Abdomen is soft.  Musculoskeletal:        General: No tenderness or deformity. Normal range of motion.     Cervical back: Normal range of motion.     Comments: Chest wall exam shows he dressed VATS and thoracotomy site on the right lateral chest wall.  There is little bit of swelling.  There is little bit of tenderness at the sites.  Lymphadenopathy:     Cervical: No cervical adenopathy.  Skin:    General: Skin is warm and dry.     Findings: No erythema or rash.  Neurological:     Mental Status: He is alert and oriented to person, place, and time.  Psychiatric:        Behavior: Behavior normal.         Thought Content: Thought content normal.        Judgment: Judgment normal.      Lab Results  Component Value Date   WBC 8.7 08/27/2023   HGB 12.9 (L) 08/27/2023   HCT 39.4 08/27/2023   MCV 96.1 08/27/2023   PLT 186 08/27/2023   Lab Results  Component Value Date   FERRITIN 91 06/05/2023   IRON 78 06/05/2023   TIBC 298 06/05/2023   UIBC 220 06/05/2023   IRONPCTSAT 26 06/05/2023  Lab Results  Component Value Date   RETICCTPCT 0.7 08/30/2022   RBC 4.10 (L) 08/27/2023   RETICCTABS 32.6 12/19/2013   No results found for: KPAFRELGTCHN, LAMBDASER, KAPLAMBRATIO No results found for: IGGSERUM, IGA, IGMSERUM No results found for: STEPHANY CARLOTA BENSON MARKEL EARLA JOANNIE DOC VICK, SPEI   Chemistry      Component Value Date/Time   NA 139 08/27/2023 1136   NA 144 11/29/2016 1054   NA 139 12/29/2015 0954   K 4.5 08/27/2023 1136   K 4.2 11/29/2016 1054   K 4.1 12/29/2015 0954   CL 104 08/27/2023 1136   CL 102 11/29/2016 1054   CO2 24 08/27/2023 1136   CO2 30 11/29/2016 1054   CO2 26 12/29/2015 0954   BUN 16 08/27/2023 1136   BUN 22 11/29/2016 1054   BUN 20.0 12/29/2015 0954   CREATININE 1.03 08/27/2023 1136   CREATININE 1.2 11/29/2016 1054   CREATININE 1.3 12/29/2015 0954      Component Value Date/Time   CALCIUM  9.0 08/27/2023 1136   CALCIUM  9.3 11/29/2016 1054   CALCIUM  9.5 12/29/2015 0954   ALKPHOS 119 08/27/2023 1136   ALKPHOS 92 (H) 11/29/2016 1054   ALKPHOS 98 12/29/2015 0954   AST 17 08/27/2023 1136   AST 16 12/29/2015 0954   ALT 14 08/27/2023 1136   ALT 20 11/29/2016 1054   ALT 13 12/29/2015 0954   BILITOT 0.5 08/27/2023 1136   BILITOT 0.71 12/29/2015 0954       Impression and Plan:  Mr. Beedle is a pleasant 84 yo gentleman with original diagnosis of  polycythemia vera and early stage pancreatic cancer.   He was treated with neoadjuvant FOLFIRINOX but unfortunately he was not had progressive disease.  As  such, he was not able to have surgery to resect out the pancreatic mass.  We are now doing chemoradiation therapy.  I think single agent Gemzar  would be reasonable with the radiation.  We will go ahead and start his treatment today.  I will plan to see him back probably in 3 weeks.   Maude JONELLE Crease, MD 8/25/202512:26 PM

## 2023-08-27 NOTE — Progress Notes (Unsigned)
 Patient here to begin concurrent ChemoRT. RXT is scheduled to start Wednesday.   Oncology Nurse Navigator Documentation     08/27/2023   12:30 PM  Oncology Nurse Navigator Flowsheets  Diagnosis Status Recurrent  Phase of Treatment Chemo/Radiation Concurrent  Chemotherapy Actual Start Date: 08/27/2023  Navigator Follow Up Date: 09/18/2023  Navigator Follow Up Reason: Follow-up Appointment;Chemotherapy  Navigator Location [facility]  Navigator Encounter Type Treatment  Treatment Initiated Date 08/27/2023  Patient Visit Type MedOnc  Treatment Phase First Chemo Tx  Barriers/Navigation Needs Coordination of Care  Interventions Psycho-Social Support  Acuity Level 2-Minimal Needs (1-2 Barriers Identified)  Support Groups/Services Friends and Family  Time Spent with Patient 15

## 2023-08-27 NOTE — Patient Instructions (Signed)
 Gemcitabine Injection What is this medication? GEMCITABINE (jem SYE ta been) treats some types of cancer. It works by slowing down the growth of cancer cells. This medicine may be used for other purposes; ask your health care provider or pharmacist if you have questions. COMMON BRAND NAME(S): Gemzar, Infugem What should I tell my care team before I take this medication? They need to know if you have any of these conditions: Blood disorders Infection Kidney disease Liver disease Lung or breathing disease, such as asthma or COPD Recent or ongoing radiation therapy An unusual or allergic reaction to gemcitabine, other medications, foods, dyes, or preservatives If you or your partner are pregnant or trying to get pregnant Breast-feeding How should I use this medication? This medication is injected into a vein. It is given by your care team in a hospital or clinic setting. Talk to your care team about the use of this medication in children. Special care may be needed. Overdosage: If you think you have taken too much of this medicine contact a poison control center or emergency room at once. NOTE: This medicine is only for you. Do not share this medicine with others. What if I miss a dose? Keep appointments for follow-up doses. It is important not to miss your dose. Call your care team if you are unable to keep an appointment. What may interact with this medication? Interactions have not been studied. This list may not describe all possible interactions. Give your health care provider a list of all the medicines, herbs, non-prescription drugs, or dietary supplements you use. Also tell them if you smoke, drink alcohol, or use illegal drugs. Some items may interact with your medicine. What should I watch for while using this medication? Your condition will be monitored carefully while you are receiving this medication. This medication may make you feel generally unwell. This is not uncommon, as  chemotherapy can affect healthy cells as well as cancer cells. Report any side effects. Continue your course of treatment even though you feel ill unless your care team tells you to stop. In some cases, you may be given additional medications to help with side effects. Follow all directions for their use. This medication may increase your risk of getting an infection. Call your care team for advice if you get a fever, chills, sore throat, or other symptoms of a cold or flu. Do not treat yourself. Try to avoid being around people who are sick. This medication may increase your risk to bruise or bleed. Call your care team if you notice any unusual bleeding. Be careful brushing or flossing your teeth or using a toothpick because you may get an infection or bleed more easily. If you have any dental work done, tell your dentist you are receiving this medication. Avoid taking medications that contain aspirin, acetaminophen, ibuprofen, naproxen, or ketoprofen unless instructed by your care team. These medications may hide a fever. Talk to your care team if you or your partner wish to become pregnant or think you might be pregnant. This medication can cause serious birth defects if taken during pregnancy and for 6 months after the last dose. A negative pregnancy test is required before starting this medication. A reliable form of contraception is recommended while taking this medication and for 6 months after the last dose. Talk to your care team about effective forms of contraception. Do not father a child while taking this medication and for 3 months after the last dose. Use a condom while having sex  during this time period. Do not breastfeed while taking this medication and for at least 1 week after the last dose. This medication may cause infertility. Talk to your care team if you are concerned about your fertility. What side effects may I notice from receiving this medication? Side effects that you should  report to your care team as soon as possible: Allergic reactions--skin rash, itching, hives, swelling of the face, lips, tongue, or throat Capillary leak syndrome--stomach or muscle pain, unusual weakness or fatigue, feeling faint or lightheaded, decrease in the amount of urine, swelling of the ankles, hands, or feet, trouble breathing Infection--fever, chills, cough, sore throat, wounds that don't heal, pain or trouble when passing urine, general feeling of discomfort or being unwell Liver injury--right upper belly pain, loss of appetite, nausea, light-colored stool, dark yellow or brown urine, yellowing skin or eyes, unusual weakness or fatigue Low red blood cell level--unusual weakness or fatigue, dizziness, headache, trouble breathing Lung injury--shortness of breath or trouble breathing, cough, spitting up blood, chest pain, fever Stomach pain, bloody diarrhea, pale skin, unusual weakness or fatigue, decrease in the amount of urine, which may be signs of hemolytic uremic syndrome Sudden and severe headache, confusion, change in vision, seizures, which may be signs of posterior reversible encephalopathy syndrome (PRES) Unusual bruising or bleeding Side effects that usually do not require medical attention (report to your care team if they continue or are bothersome): Diarrhea Drowsiness Hair loss Nausea Pain, redness, or swelling with sores inside the mouth or throat Vomiting This list may not describe all possible side effects. Call your doctor for medical advice about side effects. You may report side effects to FDA at 1-800-FDA-1088. Where should I keep my medication? This medication is given in a hospital or clinic. It will not be stored at home. NOTE: This sheet is a summary. It may not cover all possible information. If you have questions about this medicine, talk to your doctor, pharmacist, or health care provider.  2024 Elsevier/Gold Standard (2021-04-26 00:00:00)

## 2023-08-27 NOTE — Progress Notes (Signed)
 Pharmacist Chemotherapy Monitoring - Initial Assessment    Anticipated start date: 08/27/23   The following has been reviewed per standard work regarding the patient's treatment regimen: The patient's diagnosis, treatment plan and drug doses, and organ/hematologic function Lab orders and baseline tests specific to treatment regimen  The treatment plan start date, drug sequencing, and pre-medications Prior authorization status  Patient's documented medication list, including drug-drug interaction screen and prescriptions for anti-emetics and supportive care specific to the treatment regimen The drug concentrations, fluid compatibility, administration routes, and timing of the medications to be used The patient's access for treatment and lifetime cumulative dose history, if applicable  The patient's medication allergies and previous infusion related reactions, if applicable   Changes made to treatment plan:  N/A  Follow up needed:  N/A   Norleen JAYSON Sou, [facility], 08/27/2023  12:38 PM

## 2023-08-28 ENCOUNTER — Encounter: Payer: Self-pay | Admitting: Hematology & Oncology

## 2023-08-29 ENCOUNTER — Other Ambulatory Visit: Payer: Self-pay

## 2023-08-29 ENCOUNTER — Ambulatory Visit
Admission: RE | Admit: 2023-08-29 | Discharge: 2023-08-29 | Disposition: A | Discharge status: Home / Self Care | Source: Ambulatory Visit | Attending: Radiation Oncology | Admitting: Radiation Oncology

## 2023-08-29 DIAGNOSIS — Z510 Encounter for antineoplastic radiation therapy: Secondary | ICD-10-CM | POA: Diagnosis not present

## 2023-08-29 DIAGNOSIS — C250 Malignant neoplasm of head of pancreas: Secondary | ICD-10-CM

## 2023-08-29 LAB — RAD ONC ARIA SESSION SUMMARY
Course Elapsed Days: 0
Plan Fractions Treated to Date: 1
Plan Prescribed Dose Per Fraction: 1.8 Gy
Plan Total Fractions Prescribed: 25
Plan Total Prescribed Dose: 45 Gy
Reference Point Dosage Given to Date: 1.8 Gy
Reference Point Session Dosage Given: 1.8 Gy
Session Number: 1

## 2023-08-30 ENCOUNTER — Ambulatory Visit
Admission: RE | Admit: 2023-08-30 | Discharge: 2023-08-30 | Disposition: A | Discharge status: Home / Self Care | Source: Ambulatory Visit | Attending: Radiation Oncology | Admitting: Radiation Oncology

## 2023-08-30 ENCOUNTER — Other Ambulatory Visit: Payer: Self-pay

## 2023-08-30 DIAGNOSIS — Z510 Encounter for antineoplastic radiation therapy: Secondary | ICD-10-CM | POA: Diagnosis not present

## 2023-08-30 LAB — RAD ONC ARIA SESSION SUMMARY
Course Elapsed Days: 1
Plan Fractions Treated to Date: 2
Plan Prescribed Dose Per Fraction: 1.8 Gy
Plan Total Fractions Prescribed: 25
Plan Total Prescribed Dose: 45 Gy
Reference Point Dosage Given to Date: 3.6 Gy
Reference Point Session Dosage Given: 1.8 Gy
Session Number: 2

## 2023-08-31 ENCOUNTER — Other Ambulatory Visit: Payer: Self-pay

## 2023-08-31 ENCOUNTER — Ambulatory Visit
Admission: RE | Admit: 2023-08-31 | Discharge: 2023-08-31 | Disposition: A | Discharge status: Home / Self Care | Source: Ambulatory Visit | Attending: Radiation Oncology | Admitting: Radiation Oncology

## 2023-08-31 DIAGNOSIS — Z510 Encounter for antineoplastic radiation therapy: Secondary | ICD-10-CM | POA: Diagnosis not present

## 2023-08-31 LAB — RAD ONC ARIA SESSION SUMMARY
Course Elapsed Days: 2
Plan Fractions Treated to Date: 3
Plan Prescribed Dose Per Fraction: 1.8 Gy
Plan Total Fractions Prescribed: 25
Plan Total Prescribed Dose: 45 Gy
Reference Point Dosage Given to Date: 5.4 Gy
Reference Point Session Dosage Given: 1.8 Gy
Session Number: 3

## 2023-09-04 ENCOUNTER — Other Ambulatory Visit: Payer: Self-pay

## 2023-09-04 ENCOUNTER — Inpatient Hospital Stay

## 2023-09-04 ENCOUNTER — Ambulatory Visit
Admission: RE | Admit: 2023-09-04 | Discharge: 2023-09-04 | Disposition: A | Discharge status: Home / Self Care | Source: Ambulatory Visit | Attending: Radiation Oncology | Admitting: Radiation Oncology

## 2023-09-04 ENCOUNTER — Inpatient Hospital Stay: Attending: Hematology & Oncology

## 2023-09-04 DIAGNOSIS — C259 Malignant neoplasm of pancreas, unspecified: Secondary | ICD-10-CM | POA: Diagnosis present

## 2023-09-04 DIAGNOSIS — C250 Malignant neoplasm of head of pancreas: Secondary | ICD-10-CM | POA: Diagnosis present

## 2023-09-04 DIAGNOSIS — Z79899 Other long term (current) drug therapy: Secondary | ICD-10-CM | POA: Diagnosis not present

## 2023-09-04 DIAGNOSIS — Z5111 Encounter for antineoplastic chemotherapy: Secondary | ICD-10-CM | POA: Diagnosis present

## 2023-09-04 DIAGNOSIS — C252 Malignant neoplasm of tail of pancreas: Secondary | ICD-10-CM

## 2023-09-04 DIAGNOSIS — C7800 Secondary malignant neoplasm of unspecified lung: Secondary | ICD-10-CM | POA: Diagnosis present

## 2023-09-04 LAB — CBC WITH DIFFERENTIAL (CANCER CENTER ONLY)
Abs Immature Granulocytes: 0.04 K/uL (ref 0.00–0.07)
Basophils Absolute: 0 K/uL (ref 0.0–0.1)
Basophils Relative: 0 %
Eosinophils Absolute: 0.2 K/uL (ref 0.0–0.5)
Eosinophils Relative: 3 %
HCT: 37.7 % — ABNORMAL LOW (ref 39.0–52.0)
Hemoglobin: 12.5 g/dL — ABNORMAL LOW (ref 13.0–17.0)
Immature Granulocytes: 1 %
Lymphocytes Relative: 18 %
Lymphs Abs: 1.1 K/uL (ref 0.7–4.0)
MCH: 31.4 pg (ref 26.0–34.0)
MCHC: 33.2 g/dL (ref 30.0–36.0)
MCV: 94.7 fL (ref 80.0–100.0)
Monocytes Absolute: 0.7 K/uL (ref 0.1–1.0)
Monocytes Relative: 12 %
Neutro Abs: 4 K/uL (ref 1.7–7.7)
Neutrophils Relative %: 66 %
Platelet Count: 111 K/uL — ABNORMAL LOW (ref 150–400)
RBC: 3.98 MIL/uL — ABNORMAL LOW (ref 4.22–5.81)
RDW: 13.2 % (ref 11.5–15.5)
WBC Count: 6 K/uL (ref 4.0–10.5)
nRBC: 0 % (ref 0.0–0.2)

## 2023-09-04 LAB — RAD ONC ARIA SESSION SUMMARY
Course Elapsed Days: 6
Plan Fractions Treated to Date: 4
Plan Prescribed Dose Per Fraction: 1.8 Gy
Plan Total Fractions Prescribed: 25
Plan Total Prescribed Dose: 45 Gy
Reference Point Dosage Given to Date: 7.2 Gy
Reference Point Session Dosage Given: 1.8 Gy
Session Number: 4

## 2023-09-04 LAB — CMP (CANCER CENTER ONLY)
ALT: 16 U/L (ref 0–44)
AST: 21 U/L (ref 15–41)
Albumin: 3.8 g/dL (ref 3.5–5.0)
Alkaline Phosphatase: 125 U/L (ref 38–126)
Anion gap: 9 (ref 5–15)
BUN: 24 mg/dL — ABNORMAL HIGH (ref 8–23)
CO2: 25 mmol/L (ref 22–32)
Calcium: 9.1 mg/dL (ref 8.9–10.3)
Chloride: 104 mmol/L (ref 98–111)
Creatinine: 1.02 mg/dL (ref 0.61–1.24)
GFR, Estimated: >60 mL/min (ref >60)
Glucose, Bld: 198 mg/dL — ABNORMAL HIGH (ref 70–99)
Potassium: 4.4 mmol/L (ref 3.5–5.1)
Sodium: 138 mmol/L (ref 135–145)
Total Bilirubin: 0.4 mg/dL (ref 0.0–1.2)
Total Protein: 6.6 g/dL (ref 6.5–8.1)

## 2023-09-04 MED ORDER — SODIUM CHLORIDE 0.9 % IV SOLN
500.0000 mg/m2 | Freq: Once | INTRAVENOUS | Status: AC
  Administered 2023-09-04: 912 mg via INTRAVENOUS
  Filled 2023-09-04: qty 23.99

## 2023-09-04 MED ORDER — PROCHLORPERAZINE MALEATE 10 MG PO TABS
10.0000 mg | ORAL_TABLET | Freq: Once | ORAL | Status: AC
  Administered 2023-09-04: 10 mg via ORAL
  Filled 2023-09-04: qty 1

## 2023-09-04 MED ORDER — SODIUM CHLORIDE 0.9 % IV SOLN: INTRAVENOUS | Status: DC

## 2023-09-04 NOTE — Patient Instructions (Signed)

## 2023-09-04 NOTE — Patient Instructions (Signed)
 [facility] - A DEPT OF [facility]  Discharge Instructions: Thank you for choosing [facility] to provide your oncology and hematology care.   If you have a lab appointment with the Cancer Center, please go directly to the Cancer Center and check in at the registration area.  Wear comfortable clothing and clothing appropriate for easy access to any Portacath or PICC line.   We strive to give you quality time with your provider. You may need to reschedule your appointment if you arrive late (15 or more minutes).  Arriving late affects you and other patients whose appointments are after yours.  Also, if you miss three or more appointments without notifying the office, you may be dismissed from the clinic at the provider's discretion.      For prescription refill requests, have your pharmacy contact our office and allow 72 hours for refills to be completed.    Today you received the following chemotherapy and/or immunotherapy agents Gemzar.   To help prevent nausea and vomiting after your treatment, we encourage you to take your nausea medication as directed.  BELOW ARE SYMPTOMS THAT SHOULD BE REPORTED IMMEDIATELY: *FEVER GREATER THAN 100.4 F (38 C) OR HIGHER *CHILLS OR SWEATING *NAUSEA AND VOMITING THAT IS NOT CONTROLLED WITH YOUR NAUSEA MEDICATION *UNUSUAL SHORTNESS OF BREATH *UNUSUAL BRUISING OR BLEEDING *URINARY PROBLEMS (pain or burning when urinating, or frequent urination) *BOWEL PROBLEMS (unusual diarrhea, constipation, pain near the anus) TENDERNESS IN MOUTH AND THROAT WITH OR WITHOUT PRESENCE OF ULCERS (sore throat, sores in mouth, or a toothache) UNUSUAL RASH, SWELLING OR PAIN  UNUSUAL VAGINAL DISCHARGE OR ITCHING   Items with * indicate a potential emergency and should be followed up as soon as possible or go to the Emergency Department if any problems should occur.  Please show the CHEMOTHERAPY ALERT CARD or IMMUNOTHERAPY ALERT  CARD at check-in to the Emergency Department and triage nurse. Should you have questions after your visit or need to cancel or reschedule your appointment, please contact [facility] - A DEPT OF [facility]  [phone] and follow the prompts.  Office hours are 8:00 a.m. to 4:30 p.m. Monday - Friday. Please note that voicemails left after 4:00 p.m. may not be returned until the following business day.  We are closed weekends and major holidays. You have access to a nurse at all times for urgent questions. Please call the main number to the clinic [phone] and follow the prompts.  For any non-urgent questions, you may also contact your provider using MyChart. We now offer e-Visits for anyone 31 and older to request care online for non-urgent symptoms. For details visit [email or web address].   Also download the MyChart app! Go to the app store, search "MyChart", open the app, select , and log in with your MyChart username and password.

## 2023-09-05 ENCOUNTER — Ambulatory Visit
Admission: RE | Admit: 2023-09-05 | Discharge: 2023-09-05 | Disposition: A | Discharge status: Home / Self Care | Source: Ambulatory Visit | Attending: Radiation Oncology | Admitting: Radiation Oncology

## 2023-09-05 ENCOUNTER — Other Ambulatory Visit: Payer: Self-pay

## 2023-09-05 DIAGNOSIS — C250 Malignant neoplasm of head of pancreas: Secondary | ICD-10-CM | POA: Diagnosis not present

## 2023-09-05 LAB — RAD ONC ARIA SESSION SUMMARY
Course Elapsed Days: 7
Plan Fractions Treated to Date: 5
Plan Prescribed Dose Per Fraction: 1.8 Gy
Plan Total Fractions Prescribed: 25
Plan Total Prescribed Dose: 45 Gy
Reference Point Dosage Given to Date: 9 Gy
Reference Point Session Dosage Given: 1.8 Gy
Session Number: 5

## 2023-09-06 ENCOUNTER — Other Ambulatory Visit: Payer: Self-pay

## 2023-09-06 ENCOUNTER — Inpatient Hospital Stay: Admitting: Licensed Clinical Social Worker

## 2023-09-06 ENCOUNTER — Other Ambulatory Visit: Payer: Self-pay | Admitting: *Deleted

## 2023-09-06 ENCOUNTER — Ambulatory Visit
Admission: RE | Admit: 2023-09-06 | Discharge: 2023-09-06 | Disposition: A | Discharge status: Home / Self Care | Source: Ambulatory Visit | Attending: Radiation Oncology | Admitting: Radiation Oncology

## 2023-09-06 ENCOUNTER — Encounter: Payer: Self-pay | Admitting: Hematology & Oncology

## 2023-09-06 ENCOUNTER — Encounter: Payer: Self-pay | Admitting: Licensed Clinical Social Worker

## 2023-09-06 DIAGNOSIS — C250 Malignant neoplasm of head of pancreas: Secondary | ICD-10-CM

## 2023-09-06 DIAGNOSIS — K219 Gastro-esophageal reflux disease without esophagitis: Secondary | ICD-10-CM

## 2023-09-06 DIAGNOSIS — Z1379 Encounter for other screening for genetic and chromosomal anomalies: Secondary | ICD-10-CM

## 2023-09-06 DIAGNOSIS — Z800 Family history of malignant neoplasm of digestive organs: Secondary | ICD-10-CM

## 2023-09-06 LAB — RAD ONC ARIA SESSION SUMMARY
Course Elapsed Days: 8
Plan Fractions Treated to Date: 6
Plan Prescribed Dose Per Fraction: 1.8 Gy
Plan Total Fractions Prescribed: 25
Plan Total Prescribed Dose: 45 Gy
Reference Point Dosage Given to Date: 10.8 Gy
Reference Point Session Dosage Given: 1.8 Gy
Session Number: 6

## 2023-09-06 MED ORDER — PANTOPRAZOLE SODIUM 40 MG PO TBEC
40.0000 mg | DELAYED_RELEASE_TABLET | Freq: Two times a day (BID) | ORAL | 0 refills | Status: DC
Start: 2023-09-06 — End: 2023-09-18

## 2023-09-06 NOTE — Progress Notes (Signed)
 REFERRING PROVIDER: Timmy Maude SAUNDERS, MD [address] [place],  [place] [address]  PRIMARY PROVIDER:  Merna Huxley, NP  PRIMARY REASON FOR VISIT:  1. Genetic testing   2. Malignant neoplasm of head of pancreas (HCC)   3. Family history of colon cancer   4. Family history of stomach cancer    I connected with Alexander [name] on 09/06/2023 at 10:00 AM EDT by telephone and verified that I am speaking with the correct person using three identifiers.    Patient location: home Provider location: [facility]  HISTORY OF PRESENT ILLNESS:   Alexander [name], a 83 y.o. male, was seen for a [place] cancer genetics consultation at the request of Dr. Timmy due to a personal history of cancer and his genetic test results that showed an increased risk allele in APC called [id number].   Alexander [name] presents to clinic today to discuss the possibility of a hereditary predisposition to cancer, genetic testing, and to further clarify his future cancer risks, as well as potential cancer risks for family members.   CANCER HISTORY:  In 2024, at the age of 52, Alexander [name] was diagnosed with pancreatic cancer. The treatment plan includes chemotherapy and radiation. He also has history of polycythemia vera diagnosed in his mid 57s. History of >10 lifetime polyps on colonoscopy.   Oncology History  Pancreatic cancer (HCC)  09/22/2022 Initial Diagnosis   Pancreatic cancer (HCC)   09/22/2022 Cancer Staging   Staging form: Exocrine Pancreas, AJCC 8th Edition - Clinical stage from 09/22/2022: Stage IB (cT2, cN0, cM0) - Signed by Timmy Maude SAUNDERS, MD on 09/22/2022 Stage prefix: Initial diagnosis Total positive nodes: 0 Histologic grade (G): G2 Histologic grading system: 3 grade system   10/31/2022 - 05/10/2023 Chemotherapy   Patient is on Treatment Plan : PANCREAS Modified FOLFIRINOX q14d x 4 cycles     11/08/2022 Genetic Testing   APC c.3920T>A (p.[id number]) Increased risk allele  identified on the [facility] Common Hereditary Cancer + RNA panel.  The report date is 11/08/2022.  The Common Hereditary Gene Panel offered by [facility] includes sequencing and/or deletion duplication testing of the following 47 genes: APC, ATM, AXIN2, BARD1, BMPR1A, BRCA1, BRCA2, BRIP1, CDH1, CDK4, CDKN2A (p14ARF), CDKN2A (p16INK4a), CHEK2, CTNNA1, DICER1, EPCAM (Deletion/duplication testing only), GREM1 (promoter region deletion/duplication testing only), KIT, MEN1, MLH1, MSH2, MSH3, MSH6, MUTYH, NBN, NF1, NHTL1, PALB2, PDGFRA, PMS2, POLD1, POLE, PTEN, RAD50, RAD51C, RAD51D, SDHB, SDHC, SDHD, SMAD4, SMARCA4. STK11, TP53, TSC1, TSC2, and VHL.  The following genes were evaluated for sequence changes only: SDHA and HOXB13 c.251G>A variant only.    08/27/2023 -  Chemotherapy   Patient is on Treatment Plan : PANCREAS Gemcitabine  q7d (500) + XRT       Past Medical History:  Diagnosis Date   Arthritis    minor; shoulders primarily (05/20/2013)   CAD (coronary artery disease)    a. 05/2013 - Chest pain/unstable angina and dynamic EKG changes showing cath showing atherosclerotic coronary artery disease manifested as diffuse ectasia, normal EF.   Diverticulosis of colon    GERD (gastroesophageal reflux disease)    GI bleed    secondary to Aspirin    Gout    Hematuria, microscopic    Hemorrhoids    Hyperglycemia    Hyperlipidemia    Hypertension    Lumbar back pain    Microalbuminuria    Overweight(278.02)    Pancreatic cancer (HCC) 09/22/2022   Polycythemia rubra vera (HCC)    Tubular adenoma of  colon 07/2012    Past Surgical History:  Procedure Laterality Date   BILIARY STENT PLACEMENT N/A 09/02/2022   Procedure: BILIARY STENT PLACEMENT;  Surgeon: Abran Norleen SAILOR, MD;  Location: [facility];  Service: Gastroenterology;  Laterality: N/A;   BILIARY STENT PLACEMENT  10/13/2022   Procedure: BILIARY STENT PLACEMENT;  Surgeon: Aneita Gwendlyn DASEN, MD;  Location: [facility];  Service:  Gastroenterology;;   BIOPSY  09/11/2022   Procedure: BIOPSY;  Surgeon: Wilhelmenia Aloha Raddle., MD;  Location: [facility];  Service: Gastroenterology;;   CARDIAC CATHETERIZATION  05/20/2013   CATARACT EXTRACTION W/ INTRAOCULAR LENS  IMPLANT, BILATERAL Bilateral 2008   CYSTECTOMY  ~ 2000    SEBACEOUS cyst removed from between shoulder blades   ENDOSCOPIC RETROGRADE CHOLANGIOPANCREATOGRAPHY (ERCP) WITH PROPOFOL  N/A 09/02/2022   Procedure: ENDOSCOPIC RETROGRADE CHOLANGIOPANCREATOGRAPHY (ERCP) WITH PROPOFOL ;  Surgeon: Abran Norleen SAILOR, MD;  Location: [facility];  Service: Gastroenterology;  Laterality: N/A;   ENDOSCOPIC RETROGRADE CHOLANGIOPANCREATOGRAPHY (ERCP) WITH PROPOFOL  N/A 10/13/2022   Procedure: ENDOSCOPIC RETROGRADE CHOLANGIOPANCREATOGRAPHY (ERCP) WITH PROPOFOL ;  Surgeon: Aneita Gwendlyn DASEN, MD;  Location: [facility]COPY;  Service: Gastroenterology;  Laterality: N/A;   ESOPHAGOGASTRODUODENOSCOPY N/A 09/11/2022   Procedure: ESOPHAGOGASTRODUODENOSCOPY (EGD);  Surgeon: Wilhelmenia Aloha Raddle., MD;  Location: [facility];  Service: Gastroenterology;  Laterality: N/A;   EUS N/A 09/11/2022   Procedure: UPPER ENDOSCOPIC ULTRASOUND (EUS) RADIAL;  Surgeon: Wilhelmenia Aloha Raddle., MD;  Location: [facility];  Service: Gastroenterology;  Laterality: N/A;   EXCISIONAL HEMORRHOIDECTOMY  1970's   FINE NEEDLE ASPIRATION N/A 09/11/2022   Procedure: FINE NEEDLE ASPIRATION (FNA) LINEAR;  Surgeon: Wilhelmenia Aloha Raddle., MD;  Location: [facility];  Service: Gastroenterology;  Laterality: N/A;   INGUINAL HERNIA REPAIR Right ~ 2010   IR IMAGING GUIDED PORT INSERTION  10/27/2022   LEFT HEART CATHETERIZATION WITH CORONARY ANGIOGRAM N/A 05/20/2013   Procedure: LEFT HEART CATHETERIZATION WITH CORONARY ANGIOGRAM;  Surgeon: Peter M Swaziland, MD;  Location: [facility] CATH LAB;  Service: Cardiovascular;  Laterality: N/A;   SPHINCTEROTOMY  09/02/2022   Procedure: SPHINCTEROTOMY;  Surgeon: Abran Norleen SAILOR, MD;  Location: [facility];   Service: Gastroenterology;;   CLEDA REMOVAL  10/13/2022   Procedure: STENT REMOVAL;  Surgeon: Aneita Gwendlyn DASEN, MD;  Location: [facility]COPY;  Service: Gastroenterology;;   TONSILLECTOMY AND ADENOIDECTOMY  1940's   VASECTOMY      FAMILY HISTORY:  We obtained a detailed, 4-generation family history.  Significant diagnoses are listed below: Family History  Problem Relation Age of Onset   Colon cancer Mother        died at 80, colorectal   Diabetes Mother    Heart disease Father 40       MI   Diabetes Father    Stomach cancer Maternal Grandfather    Cancer Maternal Uncle    Alexander [name] has 3 daughters, ages 5, 54 and 40, no cancers. They living [place], [place], and [place]. No cancers. Alexander [name] also has 1 sister, 78.   Alexander [name] mother had colon cancer at 63 and passed at 33. A maternal uncle had thyroid  cancer. Maternal grandfather had stomach cancer in his 52s. Grandmother had cancer, unknown type, possibly throat or lung.  Alexander [name] father passed at 13, limited information about this side of the family, no cancers.  Alexander [name] is unaware of previous family history of genetic testing for hereditary cancer risks. There is reported Ashkenazi Jewish ancestry. There is no known consanguinity.    GENETIC COUNSELING ASSESSMENT: Alexander [name] is a 82 y.o. male  with a personal history of pancreatic cancer and increased risk allele in APC identified on genetic testing.  We, therefore, discussed and recommended the following at today's visit.   DISCUSSION: We discussed that approximately 10% of pancreatic cancer is hereditary. He underwent genetic testing to see if there was a hereditary cause for his pancreatic cancer. We discussed that testing is beneficial for several reasons including knowing about cancer risks, identifying potential screening and risk-reduction options that may be appropriate, and to understand if other family members could be at risk for cancer and  allow them to undergo genetic testing.   GENETIC TEST RESULTS:  Alexander [name] tested positive for a single pathogenic variant (harmful genetic change) in the APC gene. Specifically, this variant is p.I1307K. . Of note, this result DOES NOT indicate a diagnosis of familial adenomatous polyposis syndrome (FAP). Remainder of results were normal.   The Common Hereditary Cancers Panel + RNA offered by [facility] includes sequencing and/or deletion duplication testing of the following 48 genes: APC*, ATM*, AXIN2, BAP1, BARD1, BMPR1A, BRCA1, BRCA2, BRIP1, CDH1, CDK4, CDKN2A (p14ARF), CDKN2A (p16INK4a), CHEK2, CTNNA1, DICER1*, EPCAM*, FH*, GREM1*, HOXB13, KIT, MBD4, MEN1*, MLH1*, MSH2*, MSH3*, MSH6*, MUTYH, NF1*, NTHL1, PALB2, PDGFRA, PMS2*, POLD1*, POLE, PTEN*, RAD51C, RAD51D, SDHA*, SDHB, SDHC*, SDHD, SMAD4, SMARCA4, STK11, TP53, TSC1*, TSC2, VHL.   The test report has been scanned into EPIC and is located under the Molecular Pathology section of the Results Review tab.  A portion of the result report is included below for reference. Genetic testing reported out on 11/08/2022.     Cancer Risks for the p.I1307K variant in the APC gene: Colon cancer, 5-10% risk  This information is based on current understanding of the gene and may change in the future.  Management Recommendations: Colonoscopy screening every 5 years beginning at age 65 If an individual has a first-degree relative with colorectal cancer, screening should begin 10 years prior to the relative's age at diagnosis or at age 28, whichever comes first.  Implications for Family Members: Hereditary predisposition to cancer due to pathogenic variants in the APC gene has autosomal dominant inheritance. This means that an individual with a pathogenic variant has a 50% chance of passing the condition on to his/her offspring. Identification of a pathogenic variant allows for the recognition of at-risk relatives who can pursue testing for the familial  variant.  Family members are encouraged to consider genetic testing for this familial pathogenic variant. As there are generally no childhood cancer risks associated with pathogenic variants in the APC gene p. I1307K pathogenic variant, individuals in the family are not recommended to have testing until they reach at least 83 years of age. They may contact our office at [phone] for more information or to schedule an appointment. Family members who live outside of the area are encouraged to find a genetic counselor in their area by visiting: [email or web address].  Resources: FORCE (Facing Our Risk of Cancer Empowered) is a resource for those with a hereditary predisposition to develop cancer.  [employer]CE provides information about risk reduction, advocacy, legislation, and clinical trials.  Additionally, [employer]CE provides a platform for collaboration and support; which includes: peer navigation, message boards, local support groups, a toll-free helpline, research registry and recruitment, advocate training, published medical research, webinars, brochures, mastectomy photos, and more.  For more information, visit www.facingourrisk.org  This result does not explain Alexander [name] personal/family history of cancer. Possible explanations for the cancer in the family may include: The cancers in Alexander [name] and/or his family may  be sporadic/familial or due to other genetic and environmental factors. There may be a gene mutation in one of these genes that current testing methods cannot detect but that chance is small. There could be another gene that has not yet been discovered, or that we have not yet tested, that is responsible for the cancer diagnoses in the family.  It is also possible there is a hereditary cause for the cancer in the family that Alexander [name] did not inherit.  Therefore, it is important to remain in touch with cancer genetics in the future so that we can continue to  offer Mr. Rami the most up to date genetic testing.   ADDITIONAL GENETIC TESTING:  We discussed with Alexander [name] that his genetic testing was fairly extensive.  If there are additional relevant genes identified to increase cancer risk that can be analyzed in the future, we would be happy to discuss and coordinate this testing at that time.    FOLLOW-UP:  Lastly, we discussed with Alexander [name] that cancer genetics is a rapidly advancing field and it is possible that new genetic tests will be appropriate for him and/or his family members in the future. We encouraged him to remain in contact with cancer genetics on an annual basis so we can update his personal and family histories and let him know of advances in cancer genetics that may benefit this family.   Our contact number was provided. Alexander [name] questions were answered to his satisfaction, and he knows he is welcome to call us  at anytime with additional questions or concerns.   Dena Cary, MS, [facility] Genetic Counselor [email or web address] Phone: [phone]  45 minutes were spent on the date of the encounter in service to the patient including preparation, virtual consultation, documentation and care coordination. Dr. Delinda was available for discussion regarding this case.   _______________________________________________________________________ For Office Staff:  Number of people involved in session: 1 Was an Intern/ student involved with case: no

## 2023-09-07 ENCOUNTER — Ambulatory Visit
Admission: RE | Admit: 2023-09-07 | Discharge: 2023-09-07 | Disposition: A | Discharge status: Home / Self Care | Source: Ambulatory Visit | Attending: Radiation Oncology | Admitting: Radiation Oncology

## 2023-09-07 ENCOUNTER — Other Ambulatory Visit: Payer: Self-pay

## 2023-09-07 DIAGNOSIS — C250 Malignant neoplasm of head of pancreas: Secondary | ICD-10-CM | POA: Diagnosis not present

## 2023-09-07 LAB — RAD ONC ARIA SESSION SUMMARY
Course Elapsed Days: 9
Plan Fractions Treated to Date: 7
Plan Prescribed Dose Per Fraction: 1.8 Gy
Plan Total Fractions Prescribed: 25
Plan Total Prescribed Dose: 45 Gy
Reference Point Dosage Given to Date: 12.6 Gy
Reference Point Session Dosage Given: 1.8 Gy
Session Number: 7

## 2023-09-10 ENCOUNTER — Other Ambulatory Visit: Payer: Self-pay

## 2023-09-10 ENCOUNTER — Ambulatory Visit
Admission: RE | Admit: 2023-09-10 | Discharge: 2023-09-10 | Disposition: A | Discharge status: Home / Self Care | Source: Ambulatory Visit | Attending: Radiation Oncology | Admitting: Radiation Oncology

## 2023-09-10 DIAGNOSIS — C250 Malignant neoplasm of head of pancreas: Secondary | ICD-10-CM | POA: Diagnosis not present

## 2023-09-10 LAB — RAD ONC ARIA SESSION SUMMARY
Course Elapsed Days: 12
Plan Fractions Treated to Date: 8
Plan Prescribed Dose Per Fraction: 1.8 Gy
Plan Total Fractions Prescribed: 25
Plan Total Prescribed Dose: 45 Gy
Reference Point Dosage Given to Date: 14.4 Gy
Reference Point Session Dosage Given: 1.8 Gy
Session Number: 8

## 2023-09-11 ENCOUNTER — Other Ambulatory Visit: Payer: Self-pay

## 2023-09-11 ENCOUNTER — Inpatient Hospital Stay

## 2023-09-11 ENCOUNTER — Ambulatory Visit
Admission: RE | Admit: 2023-09-11 | Discharge: 2023-09-11 | Disposition: A | Discharge status: Home / Self Care | Source: Ambulatory Visit | Attending: Radiation Oncology | Admitting: Radiation Oncology

## 2023-09-11 ENCOUNTER — Encounter: Payer: Self-pay | Admitting: Adult Health

## 2023-09-11 DIAGNOSIS — C252 Malignant neoplasm of tail of pancreas: Secondary | ICD-10-CM

## 2023-09-11 DIAGNOSIS — C250 Malignant neoplasm of head of pancreas: Secondary | ICD-10-CM

## 2023-09-11 DIAGNOSIS — Z5111 Encounter for antineoplastic chemotherapy: Secondary | ICD-10-CM | POA: Diagnosis not present

## 2023-09-11 LAB — CBC WITH DIFFERENTIAL (CANCER CENTER ONLY)
Abs Immature Granulocytes: 0.01 K/uL (ref 0.00–0.07)
Basophils Absolute: 0 K/uL (ref 0.0–0.1)
Basophils Relative: 1 %
Eosinophils Absolute: 0.1 K/uL (ref 0.0–0.5)
Eosinophils Relative: 1 %
HCT: 36 % — ABNORMAL LOW (ref 39.0–52.0)
Hemoglobin: 11.9 g/dL — ABNORMAL LOW (ref 13.0–17.0)
Immature Granulocytes: 0 %
Lymphocytes Relative: 14 %
Lymphs Abs: 0.6 K/uL — ABNORMAL LOW (ref 0.7–4.0)
MCH: 30.7 pg (ref 26.0–34.0)
MCHC: 33.1 g/dL (ref 30.0–36.0)
MCV: 93 fL (ref 80.0–100.0)
Monocytes Absolute: 0.8 K/uL (ref 0.1–1.0)
Monocytes Relative: 19 %
Neutro Abs: 2.9 K/uL (ref 1.7–7.7)
Neutrophils Relative %: 65 %
Platelet Count: 98 K/uL — ABNORMAL LOW (ref 150–400)
RBC: 3.87 MIL/uL — ABNORMAL LOW (ref 4.22–5.81)
RDW: 13.3 % (ref 11.5–15.5)
WBC Count: 4.2 K/uL (ref 4.0–10.5)
nRBC: 0 % (ref 0.0–0.2)

## 2023-09-11 LAB — CMP (CANCER CENTER ONLY)
ALT: 14 U/L (ref 0–44)
AST: 19 U/L (ref 15–41)
Albumin: 3.9 g/dL (ref 3.5–5.0)
Alkaline Phosphatase: 121 U/L (ref 38–126)
Anion gap: 11 (ref 5–15)
BUN: 18 mg/dL (ref 8–23)
CO2: 23 mmol/L (ref 22–32)
Calcium: 9.1 mg/dL (ref 8.9–10.3)
Chloride: 104 mmol/L (ref 98–111)
Creatinine: 1.03 mg/dL (ref 0.61–1.24)
GFR, Estimated: >60 mL/min (ref >60)
Glucose, Bld: 178 mg/dL — ABNORMAL HIGH (ref 70–99)
Potassium: 3.9 mmol/L (ref 3.5–5.1)
Sodium: 139 mmol/L (ref 135–145)
Total Bilirubin: 0.6 mg/dL (ref 0.0–1.2)
Total Protein: 6.6 g/dL (ref 6.5–8.1)

## 2023-09-11 LAB — RAD ONC ARIA SESSION SUMMARY
Course Elapsed Days: 13
Plan Fractions Treated to Date: 9
Plan Prescribed Dose Per Fraction: 1.8 Gy
Plan Total Fractions Prescribed: 25
Plan Total Prescribed Dose: 45 Gy
Reference Point Dosage Given to Date: 16.2 Gy
Reference Point Session Dosage Given: 1.8 Gy
Session Number: 9

## 2023-09-11 MED ORDER — SONAFINE EX EMUL
1.0000 | Freq: Once | CUTANEOUS | Status: AC
  Administered 2023-09-11: 1 via TOPICAL

## 2023-09-11 NOTE — Telephone Encounter (Signed)
 Please advise there is another message coming behind this one from Pt spouse chart

## 2023-09-11 NOTE — Progress Notes (Signed)
 [facility] CSW Progress Note  Clinical Child psychotherapist contacted patient by phone to follow-up on need for community resources.    Interventions: Provided patient with information about available resources for house cleaning assistance.  He does not meet the qualifications for the [employer].  Patient reports increased fatigue since beginning treatment and his wife has limited ability.       Follow Up Plan:  CSW will follow-up with patient by phone     Alexander CHRISTELLA Au, LCSW Clinical Social Worker [facility]

## 2023-09-11 NOTE — Progress Notes (Signed)
 Pt states that he went to his dentist yesterday and that he has been diagnosed with an abscess to his first right lower molar and has been prescribed Amoxicillin  500 mg TID times 12 days. Pt states that he has tenderness and slight swelling to that sight.  Pt also states that his taste is off and that he is having increased fatigue.  Dr. Timmy notified.  Order received to hold treatment today and for pt to come back in two weeks for treatment.  Pt notified and scheduling notified.  Pt is agreeable with plan and verbalizes an understanding to call office with any issues before next scheduled appt on 09/25/23.

## 2023-09-12 ENCOUNTER — Other Ambulatory Visit: Payer: Self-pay

## 2023-09-12 ENCOUNTER — Ambulatory Visit
Admission: RE | Admit: 2023-09-12 | Discharge: 2023-09-12 | Disposition: A | Discharge status: Home / Self Care | Source: Ambulatory Visit | Attending: Radiation Oncology | Admitting: Radiation Oncology

## 2023-09-12 ENCOUNTER — Ambulatory Visit

## 2023-09-12 DIAGNOSIS — T85590A Other mechanical complication of bile duct prosthesis, initial encounter: Secondary | ICD-10-CM | POA: Diagnosis not present

## 2023-09-12 DIAGNOSIS — R1013 Epigastric pain: Secondary | ICD-10-CM | POA: Diagnosis not present

## 2023-09-12 LAB — RAD ONC ARIA SESSION SUMMARY
Course Elapsed Days: 14
Plan Fractions Treated to Date: 10
Plan Prescribed Dose Per Fraction: 1.8 Gy
Plan Total Fractions Prescribed: 25
Plan Total Prescribed Dose: 45 Gy
Reference Point Dosage Given to Date: 18 Gy
Reference Point Session Dosage Given: 1.8 Gy
Session Number: 10

## 2023-09-13 ENCOUNTER — Other Ambulatory Visit: Payer: Self-pay

## 2023-09-13 ENCOUNTER — Ambulatory Visit
Admission: RE | Admit: 2023-09-13 | Discharge: 2023-09-13 | Disposition: A | Discharge status: Home / Self Care | Source: Ambulatory Visit | Attending: Radiation Oncology | Admitting: Radiation Oncology

## 2023-09-13 LAB — RAD ONC ARIA SESSION SUMMARY
Course Elapsed Days: 15
Plan Fractions Treated to Date: 11
Plan Prescribed Dose Per Fraction: 1.8 Gy
Plan Total Fractions Prescribed: 25
Plan Total Prescribed Dose: 45 Gy
Reference Point Dosage Given to Date: 19.8 Gy
Reference Point Session Dosage Given: 1.8 Gy
Session Number: 11

## 2023-09-14 ENCOUNTER — Ambulatory Visit
Admission: RE | Admit: 2023-09-14 | Discharge: 2023-09-14 | Disposition: A | Discharge status: Home / Self Care | Source: Ambulatory Visit | Attending: Radiation Oncology | Admitting: Radiation Oncology

## 2023-09-14 ENCOUNTER — Other Ambulatory Visit: Payer: Self-pay

## 2023-09-14 DIAGNOSIS — C250 Malignant neoplasm of head of pancreas: Secondary | ICD-10-CM | POA: Diagnosis present

## 2023-09-14 LAB — RAD ONC ARIA SESSION SUMMARY
Course Elapsed Days: 16
Plan Fractions Treated to Date: 12
Plan Prescribed Dose Per Fraction: 1.8 Gy
Plan Total Fractions Prescribed: 25
Plan Total Prescribed Dose: 45 Gy
Reference Point Dosage Given to Date: 21.6 Gy
Reference Point Session Dosage Given: 1.8 Gy
Session Number: 12

## 2023-09-15 ENCOUNTER — Other Ambulatory Visit: Payer: Self-pay

## 2023-09-15 ENCOUNTER — Encounter (HOSPITAL_COMMUNITY): Payer: Self-pay

## 2023-09-15 ENCOUNTER — Inpatient Hospital Stay (HOSPITAL_COMMUNITY)
Admission: EM | Admit: 2023-09-15 | Discharge: 2023-09-18 | DRG: 919 | Disposition: A | Discharge status: Home / Self Care | Attending: Family Medicine | Admitting: Family Medicine

## 2023-09-15 ENCOUNTER — Emergency Department (HOSPITAL_COMMUNITY)

## 2023-09-15 ENCOUNTER — Encounter: Payer: Self-pay | Admitting: Adult Health

## 2023-09-15 ENCOUNTER — Encounter: Payer: Self-pay | Admitting: Hematology & Oncology

## 2023-09-15 DIAGNOSIS — Z95828 Presence of other vascular implants and grafts: Secondary | ICD-10-CM | POA: Diagnosis not present

## 2023-09-15 DIAGNOSIS — R748 Abnormal levels of other serum enzymes: Secondary | ICD-10-CM | POA: Diagnosis present

## 2023-09-15 DIAGNOSIS — C7800 Secondary malignant neoplasm of unspecified lung: Secondary | ICD-10-CM

## 2023-09-15 DIAGNOSIS — Z8616 Personal history of COVID-19: Secondary | ICD-10-CM

## 2023-09-15 DIAGNOSIS — K047 Periapical abscess without sinus: Secondary | ICD-10-CM | POA: Diagnosis present

## 2023-09-15 DIAGNOSIS — Y828 Other medical devices associated with adverse incidents: Secondary | ICD-10-CM | POA: Diagnosis present

## 2023-09-15 DIAGNOSIS — R1013 Epigastric pain: Secondary | ICD-10-CM | POA: Diagnosis present

## 2023-09-15 DIAGNOSIS — T85590A Other mechanical complication of bile duct prosthesis, initial encounter: Secondary | ICD-10-CM | POA: Diagnosis present

## 2023-09-15 DIAGNOSIS — E785 Hyperlipidemia, unspecified: Secondary | ICD-10-CM | POA: Diagnosis present

## 2023-09-15 DIAGNOSIS — K831 Obstruction of bile duct: Secondary | ICD-10-CM | POA: Diagnosis present

## 2023-09-15 DIAGNOSIS — Z888 Allergy status to other drugs, medicaments and biological substances status: Secondary | ICD-10-CM

## 2023-09-15 DIAGNOSIS — Z794 Long term (current) use of insulin: Secondary | ICD-10-CM | POA: Diagnosis not present

## 2023-09-15 DIAGNOSIS — K838 Other specified diseases of biliary tract: Secondary | ICD-10-CM | POA: Diagnosis not present

## 2023-09-15 DIAGNOSIS — F32A Depression, unspecified: Secondary | ICD-10-CM | POA: Diagnosis present

## 2023-09-15 DIAGNOSIS — Z923 Personal history of irradiation: Secondary | ICD-10-CM

## 2023-09-15 DIAGNOSIS — C259 Malignant neoplasm of pancreas, unspecified: Secondary | ICD-10-CM | POA: Diagnosis present

## 2023-09-15 DIAGNOSIS — D45 Polycythemia vera: Secondary | ICD-10-CM | POA: Diagnosis present

## 2023-09-15 DIAGNOSIS — Z66 Do not resuscitate: Secondary | ICD-10-CM | POA: Diagnosis not present

## 2023-09-15 DIAGNOSIS — M109 Gout, unspecified: Secondary | ICD-10-CM | POA: Diagnosis present

## 2023-09-15 DIAGNOSIS — Z8249 Family history of ischemic heart disease and other diseases of the circulatory system: Secondary | ICD-10-CM | POA: Diagnosis not present

## 2023-09-15 DIAGNOSIS — K219 Gastro-esophageal reflux disease without esophagitis: Secondary | ICD-10-CM | POA: Diagnosis present

## 2023-09-15 DIAGNOSIS — I2510 Atherosclerotic heart disease of native coronary artery without angina pectoris: Secondary | ICD-10-CM | POA: Diagnosis present

## 2023-09-15 DIAGNOSIS — C250 Malignant neoplasm of head of pancreas: Secondary | ICD-10-CM | POA: Diagnosis not present

## 2023-09-15 DIAGNOSIS — Z9221 Personal history of antineoplastic chemotherapy: Secondary | ICD-10-CM

## 2023-09-15 DIAGNOSIS — I25110 Atherosclerotic heart disease of native coronary artery with unstable angina pectoris: Secondary | ICD-10-CM | POA: Diagnosis not present

## 2023-09-15 DIAGNOSIS — K8050 Calculus of bile duct without cholangitis or cholecystitis without obstruction: Secondary | ICD-10-CM | POA: Diagnosis present

## 2023-09-15 DIAGNOSIS — K8051 Calculus of bile duct without cholangitis or cholecystitis with obstruction: Secondary | ICD-10-CM | POA: Diagnosis not present

## 2023-09-15 DIAGNOSIS — C7801 Secondary malignant neoplasm of right lung: Secondary | ICD-10-CM | POA: Diagnosis present

## 2023-09-15 DIAGNOSIS — G893 Neoplasm related pain (acute) (chronic): Secondary | ICD-10-CM | POA: Diagnosis present

## 2023-09-15 DIAGNOSIS — I10 Essential (primary) hypertension: Secondary | ICD-10-CM | POA: Diagnosis present

## 2023-09-15 DIAGNOSIS — E119 Type 2 diabetes mellitus without complications: Secondary | ICD-10-CM | POA: Diagnosis not present

## 2023-09-15 DIAGNOSIS — K828 Other specified diseases of gallbladder: Secondary | ICD-10-CM | POA: Diagnosis present

## 2023-09-15 DIAGNOSIS — R7989 Other specified abnormal findings of blood chemistry: Secondary | ICD-10-CM

## 2023-09-15 DIAGNOSIS — E1165 Type 2 diabetes mellitus with hyperglycemia: Secondary | ICD-10-CM | POA: Diagnosis present

## 2023-09-15 DIAGNOSIS — K8309 Other cholangitis: Secondary | ICD-10-CM

## 2023-09-15 DIAGNOSIS — R932 Abnormal findings on diagnostic imaging of liver and biliary tract: Secondary | ICD-10-CM | POA: Diagnosis not present

## 2023-09-15 DIAGNOSIS — C252 Malignant neoplasm of tail of pancreas: Secondary | ICD-10-CM

## 2023-09-15 DIAGNOSIS — K8031 Calculus of bile duct with cholangitis, unspecified, with obstruction: Secondary | ICD-10-CM | POA: Diagnosis present

## 2023-09-15 DIAGNOSIS — Z860101 Personal history of adenomatous and serrated colon polyps: Secondary | ICD-10-CM

## 2023-09-15 DIAGNOSIS — Z800 Family history of malignant neoplasm of digestive organs: Secondary | ICD-10-CM

## 2023-09-15 DIAGNOSIS — Z79899 Other long term (current) drug therapy: Secondary | ICD-10-CM

## 2023-09-15 DIAGNOSIS — Z833 Family history of diabetes mellitus: Secondary | ICD-10-CM

## 2023-09-15 LAB — CBC
HCT: 39.5 % (ref 39.0–52.0)
Hemoglobin: 13.1 g/dL (ref 13.0–17.0)
MCH: 31.6 pg (ref 26.0–34.0)
MCHC: 33.2 g/dL (ref 30.0–36.0)
MCV: 95.2 fL (ref 80.0–100.0)
Platelets: 195 K/uL (ref 150–400)
RBC: 4.15 MIL/uL — ABNORMAL LOW (ref 4.22–5.81)
RDW: 14.3 % (ref 11.5–15.5)
WBC: 7.3 K/uL (ref 4.0–10.5)
nRBC: 0 % (ref 0.0–0.2)

## 2023-09-15 LAB — HEPATIC FUNCTION PANEL
ALT: 180 U/L — ABNORMAL HIGH (ref 0–44)
AST: 333 U/L — ABNORMAL HIGH (ref 15–41)
Albumin: 3.4 g/dL — ABNORMAL LOW (ref 3.5–5.0)
Alkaline Phosphatase: 219 U/L — ABNORMAL HIGH (ref 38–126)
Bilirubin, Direct: 1 mg/dL — ABNORMAL HIGH (ref 0.0–0.2)
Indirect Bilirubin: 0.9 mg/dL (ref 0.3–0.9)
Total Bilirubin: 1.9 mg/dL — ABNORMAL HIGH (ref 0.0–1.2)
Total Protein: 6.5 g/dL (ref 6.5–8.1)

## 2023-09-15 LAB — I-STAT CHEM 8, ED
BUN: 17 mg/dL (ref 8–23)
Calcium, Ion: 1.19 mmol/L (ref 1.15–1.40)
Chloride: 103 mmol/L (ref 98–111)
Creatinine, Ser: 1.1 mg/dL (ref 0.61–1.24)
Glucose, Bld: 156 mg/dL — ABNORMAL HIGH (ref 70–99)
HCT: 40 % (ref 39.0–52.0)
Hemoglobin: 13.6 g/dL (ref 13.0–17.0)
Potassium: 4 mmol/L (ref 3.5–5.1)
Sodium: 142 mmol/L (ref 135–145)
TCO2: 25 mmol/L (ref 22–32)

## 2023-09-15 LAB — LIPASE, BLOOD: Lipase: 18 U/L (ref 11–51)

## 2023-09-15 LAB — BASIC METABOLIC PANEL WITH GFR
Anion gap: 14 (ref 5–15)
BUN: 15 mg/dL (ref 8–23)
CO2: 23 mmol/L (ref 22–32)
Calcium: 9.4 mg/dL (ref 8.9–10.3)
Chloride: 104 mmol/L (ref 98–111)
Creatinine, Ser: 1.04 mg/dL (ref 0.61–1.24)
GFR, Estimated: >60 mL/min (ref >60)
Glucose, Bld: 153 mg/dL — ABNORMAL HIGH (ref 70–99)
Potassium: 4 mmol/L (ref 3.5–5.1)
Sodium: 141 mmol/L (ref 135–145)

## 2023-09-15 LAB — TROPONIN I (HIGH SENSITIVITY)
Troponin I (High Sensitivity): 9 ng/L (ref <18)
Troponin I (High Sensitivity): 10 ng/L (ref <18)

## 2023-09-15 MED ORDER — AMLODIPINE BESYLATE 10 MG PO TABS
10.0000 mg | ORAL_TABLET | Freq: Every day | ORAL | Status: DC
  Administered 2023-09-15 – 2023-09-18 (×4): 10 mg via ORAL
  Filled 2023-09-15 (×2): qty 1
  Filled 2023-09-15: qty 2
  Filled 2023-09-15: qty 1

## 2023-09-15 MED ORDER — INSULIN GLARGINE 100 UNIT/ML ~~LOC~~ SOLN: 16.0000 [IU] | Freq: Every day | SUBCUTANEOUS | Status: DC

## 2023-09-15 MED ORDER — OLANZAPINE 5 MG PO TABS
5.0000 mg | ORAL_TABLET | Freq: Every day | ORAL | Status: DC
Start: 2023-09-15 — End: 2023-09-15
  Filled 2023-09-15: qty 1

## 2023-09-15 MED ORDER — PANCRELIPASE (LIP-PROT-AMYL) 36000-114000 UNITS PO CPEP
72000.0000 [IU] | ORAL_CAPSULE | Freq: Three times a day (TID) | ORAL | Status: DC
  Administered 2023-09-16 – 2023-09-18 (×3): 72000 [IU] via ORAL
  Filled 2023-09-15 (×9): qty 2

## 2023-09-15 MED ORDER — SODIUM CHLORIDE 0.9 % IV SOLN: INTRAVENOUS | Status: DC

## 2023-09-15 MED ORDER — IOHEXOL 350 MG/ML SOLN
75.0000 mL | Freq: Once | INTRAVENOUS | Status: AC | PRN
  Administered 2023-09-15: 75 mL via INTRAVENOUS

## 2023-09-15 MED ORDER — PANTOPRAZOLE SODIUM 40 MG PO TBEC
40.0000 mg | DELAYED_RELEASE_TABLET | Freq: Three times a day (TID) | ORAL | Status: DC
  Administered 2023-09-15 – 2023-09-18 (×8): 40 mg via ORAL
  Filled 2023-09-15 (×8): qty 1

## 2023-09-15 MED ORDER — PROCHLORPERAZINE MALEATE 10 MG PO TABS: 10.0000 mg | ORAL_TABLET | Freq: Four times a day (QID) | ORAL | Status: DC | PRN

## 2023-09-15 MED ORDER — MORPHINE SULFATE (PF) 2 MG/ML IV SOLN
2.0000 mg | INTRAVENOUS | Status: DC | PRN
  Administered 2023-09-16 – 2023-09-17 (×5): 2 mg via INTRAVENOUS
  Filled 2023-09-15 (×5): qty 1

## 2023-09-15 MED ORDER — ONDANSETRON HCL 4 MG PO TABS: 8.0000 mg | ORAL_TABLET | Freq: Three times a day (TID) | ORAL | Status: DC | PRN

## 2023-09-15 MED ORDER — OXYCODONE HCL 5 MG PO TABS
5.0000 mg | ORAL_TABLET | ORAL | Status: DC | PRN
  Administered 2023-09-16 (×2): 5 mg via ORAL
  Filled 2023-09-15 (×3): qty 1

## 2023-09-15 MED ORDER — PIPERACILLIN-TAZOBACTAM 3.375 G IVPB
3.3750 g | Freq: Three times a day (TID) | INTRAVENOUS | Status: DC
  Administered 2023-09-15 – 2023-09-18 (×10): 3.375 g via INTRAVENOUS
  Filled 2023-09-15 (×10): qty 50

## 2023-09-15 MED ORDER — INSULIN ASPART 100 UNIT/ML FLEXPEN: 2.0000 [IU] | PEN_INJECTOR | Freq: Three times a day (TID) | SUBCUTANEOUS | Status: DC

## 2023-09-15 MED ORDER — INSULIN ASPART 100 UNIT/ML IJ SOLN: 0.0000 [IU] | Freq: Every day | INTRAMUSCULAR | Status: DC

## 2023-09-15 MED ORDER — INSULIN ASPART 100 UNIT/ML IJ SOLN: 0.0000 [IU] | Freq: Three times a day (TID) | INTRAMUSCULAR | Status: DC

## 2023-09-15 MED ORDER — SODIUM CHLORIDE 0.9% FLUSH: 10.0000 mL | INTRAVENOUS | Status: DC | PRN

## 2023-09-15 MED ORDER — INSULIN ASPART 100 UNIT/ML IJ SOLN
0.0000 [IU] | Freq: Three times a day (TID) | INTRAMUSCULAR | Status: DC
  Administered 2023-09-15: 3 [IU] via SUBCUTANEOUS

## 2023-09-15 MED ORDER — INSULIN GLARGINE 100 UNIT/ML ~~LOC~~ SOLN
16.0000 [IU] | Freq: Every day | SUBCUTANEOUS | Status: DC
  Administered 2023-09-15 – 2023-09-16 (×2): 16 [IU] via SUBCUTANEOUS
  Filled 2023-09-15 (×3): qty 0.16

## 2023-09-15 MED ORDER — INSULIN GLARGINE 100 UNIT/ML ~~LOC~~ SOLN
10.0000 [IU] | Freq: Two times a day (BID) | SUBCUTANEOUS | Status: DC
  Filled 2023-09-15 (×2): qty 0.1

## 2023-09-15 MED ORDER — PANTOPRAZOLE SODIUM 40 MG PO TBEC
40.0000 mg | DELAYED_RELEASE_TABLET | Freq: Two times a day (BID) | ORAL | Status: DC
  Administered 2023-09-15 (×2): 40 mg via ORAL
  Filled 2023-09-15 (×2): qty 1

## 2023-09-15 MED ORDER — ONDANSETRON HCL 4 MG/2ML IJ SOLN
4.0000 mg | Freq: Once | INTRAMUSCULAR | Status: AC
  Administered 2023-09-15: 4 mg via INTRAVENOUS
  Filled 2023-09-15: qty 2

## 2023-09-15 MED ORDER — HYDROMORPHONE HCL 1 MG/ML IJ SOLN
1.0000 mg | Freq: Once | INTRAMUSCULAR | Status: AC
  Administered 2023-09-15: 1 mg via INTRAVENOUS
  Filled 2023-09-15: qty 1

## 2023-09-15 MED ORDER — CHLORHEXIDINE GLUCONATE CLOTH 2 % EX PADS
6.0000 | MEDICATED_PAD | Freq: Every day | CUTANEOUS | Status: DC
  Administered 2023-09-16 – 2023-09-18 (×3): 6 via TOPICAL

## 2023-09-15 MED ORDER — PANCRELIPASE (LIP-PROT-AMYL) 36000-114000 UNITS PO CPEP
36000.0000 [IU] | ORAL_CAPSULE | Freq: Three times a day (TID) | ORAL | Status: DC
  Administered 2023-09-15 (×2): 36000 [IU] via ORAL
  Filled 2023-09-15 (×4): qty 1

## 2023-09-15 MED ORDER — MORPHINE SULFATE (PF) 4 MG/ML IV SOLN
4.0000 mg | Freq: Once | INTRAVENOUS | Status: AC
  Administered 2023-09-15: 4 mg via INTRAVENOUS
  Filled 2023-09-15: qty 1

## 2023-09-15 MED ORDER — SODIUM CHLORIDE 0.9 % IV SOLN: Freq: Once | INTRAVENOUS | Status: AC

## 2023-09-15 MED ORDER — INSULIN ASPART 100 UNIT/ML IJ SOLN: 2.0000 [IU] | Freq: Three times a day (TID) | INTRAMUSCULAR | Status: DC

## 2023-09-15 MED ORDER — LIDOCAINE-PRILOCAINE 2.5-2.5 % EX CREA
TOPICAL_CREAM | Freq: Once | CUTANEOUS | Status: DC
  Filled 2023-09-15: qty 5

## 2023-09-15 MED ORDER — SODIUM CHLORIDE 0.9% FLUSH
10.0000 mL | Freq: Two times a day (BID) | INTRAVENOUS | Status: DC
  Administered 2023-09-17 – 2023-09-18 (×2): 10 mL

## 2023-09-15 NOTE — ED Triage Notes (Signed)
 Pt states that he has abd pain that radiates to his back, pain woke him from his sleep, denies nausea or SOB, pt is also actively getting chemo

## 2023-09-15 NOTE — ED Notes (Signed)
 Pt is being transported to room

## 2023-09-15 NOTE — Progress Notes (Signed)
 Patient has lactose intolerance and made the kitchen aware about allergy to soft cheese and milk products. MD aware.

## 2023-09-15 NOTE — Plan of Care (Signed)
  Problem: Education: Goal: Knowledge of General Education information will improve Description: Including pain rating scale, medication(s)/side effects and non-pharmacologic comfort measures Outcome: Progressing   Problem: Health Behavior/Discharge Planning: Goal: Ability to manage health-related needs will improve Outcome: Progressing   Problem: Clinical Measurements: Goal: Cardiovascular complication will be avoided Outcome: Progressing   Problem: Activity: Goal: Risk for activity intolerance will decrease Outcome: Progressing   Problem: Nutrition: Goal: Adequate nutrition will be maintained Outcome: Progressing   Problem: Coping: Goal: Level of anxiety will decrease Outcome: Progressing   Problem: Elimination: Goal: Will not experience complications related to urinary retention Outcome: Progressing   Problem: Skin Integrity: Goal: Risk for impaired skin integrity will decrease Outcome: Progressing   Problem: Safety: Goal: Ability to remain free from injury will improve Outcome: Progressing   Problem: Nutritional: Goal: Maintenance of adequate nutrition will improve Outcome: Progressing

## 2023-09-15 NOTE — H&P (Signed)
 [facility] HPI   Alexander [name] FMW:[id number] DOB: 07-25-1940 DOA: 09/15/2023 From: Home code Status full  PCP: Merna Huxley, NP   Chief Complaint: abdominal pain  HPI:  83Y very active home dwelling moved here from [place] retired Emergency planning/management officer Lives with wife-she has moderate dementia he takes care of her does all ADLs IDLs for her and is the main caregiver Swims several hours a day including walking  cT2 N0 borderline resectable pancreatic adenocarcinoma 12 cycles neoadjuvant FOLFIRINOX 10/31/2022-->5/8/025-is on Creon --started neoadjuvant XRT and Gemzar  08/27/2023 Follows Dr. Luke at [facility] for his pancreatic cancer-locally follows with our own dR.  Ennever--- he is not a candidate for Whipple procedure and has been referred back to Dr. Timmy for this purpose for medical management and XRT Status post 3 right lower lobe nodules resected with VATS wedge resection 08/03/2023 by Dr. Selinda Sharper Long of cardiothoracic surgery at [facility] uncomplicated postop course--- postop pathology showed mucinous adenocarcinoma metastatic from pancreas GERD Polycythemia vera Multiple adenomatous polyps DM TY 2 Previous ERCP 09/02/2022 with stent visualized in CBD-10/13/2022 ERCP showed CBD proximal dilatation with mass and stricture causing obstruction and stent exchange was performed partially covered SEMS he was treated for cholangitis and discharged on oral antibiotics Underwent port placement 10/26/2023  Presented from home with spouse with epigastric pain radiation to the back around 0 130 took Tylenol  without relief No nausea no vomiting but ever since chemo 09/11/2023 he has been feeling weaker and having oropharyngeal dysgeusia as well as inability to really enjoy the food that he likes-his oncologist locally Dr. Timmy told him that he would be taking a 1 week break and is scheduled for next XRT may be on 9/16 He has been trying to keep down liquids shakes and food but the  dysgeusia has prevented him from feeling like eating even though he is hungry  He recently has been placed on Amoxil  for tooth infection about 2 weeks ago and is still on the antibiotic  His daughter is Dr. Ludivina Craw--recently retired pediatrician--I had a brief chat with her about what I think the plan is  Because of the pain in his abdomen and the feeling as if he had  an obstructed stent (his own words)-he came to the emergency room He was given several rounds of morphine  Zofran  and saline below and felt better and feels closer to his normal  Review of Systems:  As mentioned above in HPI are pertinent +'s Pertinent negatives as per below   ED Course: Given Dilaudid  morphine  Zofran  saline   Past Medical History:  Diagnosis Date   Arthritis    minor; shoulders primarily (05/20/2013)   CAD (coronary artery disease)    a. 05/2013 - Chest pain/unstable angina and dynamic EKG changes showing cath showing atherosclerotic coronary artery disease manifested as diffuse ectasia, normal EF.   Diverticulosis of colon    GERD (gastroesophageal reflux disease)    GI bleed    secondary to Aspirin    Gout    Hematuria, microscopic    Hemorrhoids    Hyperglycemia    Hyperlipidemia    Hypertension    Lumbar back pain    Microalbuminuria    Overweight(278.02)    Pancreatic cancer (HCC) 09/22/2022   Polycythemia rubra vera (HCC)    Tubular adenoma of colon 07/2012   Past Surgical History:  Procedure Laterality Date   BILIARY STENT PLACEMENT N/A 09/02/2022   Procedure: BILIARY STENT PLACEMENT;  Surgeon: Abran Norleen SAILOR, MD;  Location: [facility];  Service: Gastroenterology;  Laterality: N/A;   BILIARY STENT PLACEMENT  10/13/2022   Procedure: BILIARY STENT PLACEMENT;  Surgeon: Aneita Gwendlyn DASEN, MD;  Location: [facility]COPY;  Service: Gastroenterology;;   BIOPSY  09/11/2022   Procedure: BIOPSY;  Surgeon: Wilhelmenia Aloha Raddle., MD;  Location: [facility];  Service: Gastroenterology;;    CARDIAC CATHETERIZATION  05/20/2013   CATARACT EXTRACTION W/ INTRAOCULAR LENS  IMPLANT, BILATERAL Bilateral 2008   CYSTECTOMY  ~ 2000    SEBACEOUS cyst removed from between shoulder blades   ENDOSCOPIC RETROGRADE CHOLANGIOPANCREATOGRAPHY (ERCP) WITH PROPOFOL  N/A 09/02/2022   Procedure: ENDOSCOPIC RETROGRADE CHOLANGIOPANCREATOGRAPHY (ERCP) WITH PROPOFOL ;  Surgeon: Abran Norleen SAILOR, MD;  Location: [facility];  Service: Gastroenterology;  Laterality: N/A;   ENDOSCOPIC RETROGRADE CHOLANGIOPANCREATOGRAPHY (ERCP) WITH PROPOFOL  N/A 10/13/2022   Procedure: ENDOSCOPIC RETROGRADE CHOLANGIOPANCREATOGRAPHY (ERCP) WITH PROPOFOL ;  Surgeon: Aneita Gwendlyn DASEN, MD;  Location: [facility] ENDOSCOPY;  Service: Gastroenterology;  Laterality: N/A;   ESOPHAGOGASTRODUODENOSCOPY N/A 09/11/2022   Procedure: ESOPHAGOGASTRODUODENOSCOPY (EGD);  Surgeon: Wilhelmenia Aloha Raddle., MD;  Location: [facility];  Service: Gastroenterology;  Laterality: N/A;   EUS N/A 09/11/2022   Procedure: UPPER ENDOSCOPIC ULTRASOUND (EUS) RADIAL;  Surgeon: Wilhelmenia Aloha Raddle., MD;  Location: [facility];  Service: Gastroenterology;  Laterality: N/A;   EXCISIONAL HEMORRHOIDECTOMY  1970's   FINE NEEDLE ASPIRATION N/A 09/11/2022   Procedure: FINE NEEDLE ASPIRATION (FNA) LINEAR;  Surgeon: Wilhelmenia Aloha Raddle., MD;  Location: [facility];  Service: Gastroenterology;  Laterality: N/A;   INGUINAL HERNIA REPAIR Right ~ 2010   IR IMAGING GUIDED PORT INSERTION  10/27/2022   LEFT HEART CATHETERIZATION WITH CORONARY ANGIOGRAM N/A 05/20/2013   Procedure: LEFT HEART CATHETERIZATION WITH CORONARY ANGIOGRAM;  Surgeon: Peter M Swaziland, MD;  Location: [facility] CATH LAB;  Service: Cardiovascular;  Laterality: N/A;   SPHINCTEROTOMY  09/02/2022   Procedure: SPHINCTEROTOMY;  Surgeon: Abran Norleen SAILOR, MD;  Location: [facility];  Service: Gastroenterology;;   CLEDA REMOVAL  10/13/2022   Procedure: STENT REMOVAL;  Surgeon: Aneita Gwendlyn DASEN, MD;  Location: [facility] ENDOSCOPY;  Service:  Gastroenterology;;   TONSILLECTOMY AND ADENOIDECTOMY  1940's   VASECTOMY      reports that he has never smoked. He has never used smokeless tobacco. He reports that he does not drink alcohol and does not use drugs.  Mobility: Independent at baseline  Allergies  Allergen Reactions   Metformin  And Related Diarrhea   Metformin  Hcl Diarrhea   Family History  Problem Relation Age of Onset   Colon cancer Mother        died at 40, colorectal   Diabetes Mother    Heart disease Father 71       MI   Diabetes Father    Stomach cancer Maternal Grandfather    Cancer Maternal Uncle    Prior to Admission medications   Medication Sig Start Date End Date Taking? Authorizing Provider  Accu-Chek Softclix Lancets lancets Use to test blood glucose once daily 11/09/21   Nafziger, Darleene, NP  amLODipine  (NORVASC ) 10 MG tablet Take 1 tablet (10 mg total) by mouth daily. 12/15/22   Nafziger, Darleene, NP  amoxicillin  (AMOXIL ) 500 MG capsule Take four capsules (2000 mg) one hour prior to dental procedure. 04/19/23   Timmy Maude SAUNDERS, MD  BD PEN NEEDLE NANO 2ND GEN 32G X 4 MM MISC  03/15/23   [provider]  Continuous Glucose Sensor (FREESTYLE LIBRE 3 PLUS SENSOR) MISC Change sensor every 15 days. 03/01/23   Nafziger, Cory, NP  Glucagon  (GVOKE HYPOPEN  1-PACK)  1 MG/0.2ML SOAJ Inject 1 mg into the skin as needed (low blood sugar with impaired consciousness). 12/14/22   Thapa, [place], MD  glucose blood (EMBRACE PRO GLUCOSE TEST) test strip Use as instructed 04/19/22   Merna Huxley, NP  insulin  aspart (NOVOLOG  FLEXPEN) 100 UNIT/ML FlexPen 2-3 units with meals 3 times a day plus sliding scale as instructed, maximum 20units/day Patient taking differently: 5-6 units with meals 3 times a day plus sliding scale as instructed, maximum 20units/day 12/15/22   Thapa, [place], MD  insulin  glargine (LANTUS  SOLOSTAR) 100 UNIT/ML Solostar Pen INJECT 20 UNITS INTO THE SKIN 2 TIMES A DAY Patient taking differently: INJECT 16  UNITS INTO THE SKIN daily 02/01/23   Nafziger, Huxley, NP  insulin  lispro (HUMALOG  KWIKPEN) 100 UNIT/ML KwikPen 2-3 units with meals 3 times a day plus sliding scale as instructed, maximum 20 units / day. 12/14/22   Thapa, [place], MD  Insulin  Pen Needle (DROPLET PEN NEEDLES) 31G X 6 MM MISC Use to check blood sugar TID 03/01/23   Merna Huxley, NP  Insulin  Syringe-Needle U-100 (INSULIN  SYRINGE .3CC/30GX5/16) 30G X 5/16 0.3 ML MISC Use for insulin  administration three times a day 11/21/22   Nafziger, Cory, NP  lidocaine -prilocaine  (EMLA ) cream Apply to affected area once 08/27/23   Ennever, Peter R, MD  lipase/protease/amylase (CREON ) 36000 UNITS CPEP capsule TAKE TWO CAPSULES BY MOUTH THREE TIMES A DAY WITH MEALS. ALSO MAY TAKE ONE CAPSULE AS NEEDED WITH SNACKS (UP TO 4 SNACKS PER DAY) 05/15/23   Timmy Maude SAUNDERS, MD  OLANZapine  (ZYPREXA ) 5 MG tablet Take 1 tablet (5 mg total) by mouth at bedtime. 10/23/22   Timmy Maude SAUNDERS, MD  ondansetron  (ZOFRAN ) 8 MG tablet Take 1 tablet (8 mg total) by mouth every 8 (eight) hours as needed for nausea or vomiting. 08/27/23   Timmy Maude SAUNDERS, MD  pantoprazole  (PROTONIX ) 40 MG tablet Take 1 tablet (40 mg total) by mouth 2 (two) times daily before a meal. 09/06/23 09/05/24  Ennever, Maude SAUNDERS, MD  prochlorperazine  (COMPAZINE ) 10 MG tablet Take 1 tablet (10 mg total) by mouth every 6 (six) hours as needed for nausea or vomiting. 08/27/23   Timmy Maude SAUNDERS, MD  saccharomyces boulardii (FLORASTOR) 250 MG capsule Take 250 mg by mouth 2 (two) times daily.    [provider]  VITAMIN D, CHOLECALCIFEROL, PO Take by mouth 2 (two) times daily.    [provider]    Physical Exam:  Vitals:   09/15/23 0314 09/15/23 0503  BP: (!) 186/91 (!) 148/80  Pulse: 65 63  Resp: 16 (!) 22  Temp: 97.8 F (36.6 C) 97.6 F (36.4 C)  SpO2: 99% 100%    Very pleasant white male looks about stated age partial dentures No icterus no pallor No thrush S1-S2 no murmur port in  place right side Abdomen soft no rebound no guarding ROM intact No lower extremity edema  I have personally reviewed following labs and imaging studies  Labs:  Sodium 141 potassium 4.0 BUN/creatinine 15/1.0 Alk phos 219 AST/ALT 333/180, bilirubin 1.9 WBC 7.3  Imaging studies:  CT ABD sequently of obstructing pancreatic tumor with metal CBD stent in place intra extrahepatic ductal dilatation not changed main pancreatic ductal dilatation regressed spiculated pancreatic soft tissue along the distal CBD has progressed To right lung base nodule 7 to 9 mm are new or increased No other findings  Medical tests:  EKG independently reviewed: PVCs  Test discussed with performing physician: Mr. Lennice  Decision to obtain  old records:  Yes  Review and summation of old records:  Yes  Active Problems:   * No active hospital problems. *   Assessment/Plan Likely CBD obstruction requiring stent replacement-risk for cholangitis  Start Zosyn , fluids 100 cc/H-clear liquids only in case we can get GI input with regards to ERCP-May need MRCP?  First GI has been consulted by ED Pain control Oxy IR liquid and then Dilaudid  for severe pain Trend LFT INR a.m. Can resume Zofran  and Compazine  given nausea Can continue Creon  Metastatic pancreatic cancer status post 1 cycle of chemo above Currently neoadjuvant therapy which is on hold for now   Requires discussion with oncology-Will CC Dr. Timmy  Will require outpatient goals of care discussions with Dr. Simeon confirms DNR but would want treatment Recent VATS for lung masses  Has metastatic disease as above-outpatient follow-up at [facility] Recent tooth infection  Stop Amoxil  as will be on Zosyn -likely will need to see his dentist as an outpatient Diabetes mellitus   Cut back usual dose of long-acting insulin  from 20-10 twice daily Placed on sliding scale insulin  additionally every 4 as is going to only be on clear liquids with meds for  now Depression  Continue Zyprexa  5 daily   Severity of Illness: The appropriate patient status for this patient is OBSERVATION. Observation status is judged to be reasonable and necessary in order to provide the required intensity of service to ensure the patient's safety. The patient's presenting symptoms, physical exam findings, and initial radiographic and laboratory data in the context of their medical condition is felt to place them at decreased risk for further clinical deterioration. Furthermore, it is anticipated that the patient will be medically stable for discharge from the hospital within 2 midnights of admission.    Family Communication: Discussed with daughter  DVT ppx: Lovenox  Consults called & Whom: GI consulted by ED  Time spent: 60 minutes  Royal, MD dufm my NP partners at night for Care related issues] [facility] --[employer]ia [employer] OR , [email or web address]; password [facility]  09/15/2023, 7:46 AM

## 2023-09-15 NOTE — ED Notes (Signed)
 Pt family member came to nurse station stating pt pain is increasing. RN was informed last time pt had this pain he had a stent placed in his stomach. RN will notify EDP for pain medication

## 2023-09-15 NOTE — ED Provider Notes (Signed)
 [place] EMERGENCY DEPARTMENT AT [facility] Provider Note   CSN: [id number] Arrival date & time: 09/15/23  0310     Patient presents with: Abdominal Pain   Alexander [name] is a 83 y.o. male.   83 year old male with history of pancreatic cancer, last chemo 2 weeks ago, doing radiation 5 days a week.  Patient states he woke up with epigastric pain radiating through to his back at midnight tonight.  States this feels similar to when he was having problems with his pancreatic stent.  Pain is constant.  Denies fevers, vomiting, changes in bowel or bladder habits.       Prior to Admission medications   Medication Sig Start Date End Date Taking? Authorizing Provider  Accu-Chek Softclix Lancets lancets Use to test blood glucose once daily 11/09/21   Nafziger, Darleene, NP  amLODipine  (NORVASC ) 10 MG tablet Take 1 tablet (10 mg total) by mouth daily. 12/15/22   Nafziger, Darleene, NP  amoxicillin  (AMOXIL ) 500 MG capsule Take four capsules (2000 mg) one hour prior to dental procedure. 04/19/23   Timmy Maude SAUNDERS, MD  BD PEN NEEDLE NANO 2ND GEN 32G X 4 MM MISC  03/15/23   [provider]  Continuous Glucose Sensor (FREESTYLE LIBRE 3 PLUS SENSOR) MISC Change sensor every 15 days. 03/01/23   Nafziger, Darleene, NP  Glucagon  (GVOKE HYPOPEN  1-PACK) 1 MG/0.2ML SOAJ Inject 1 mg into the skin as needed (low blood sugar with impaired consciousness). 12/14/22   Thapa, [place], MD  glucose blood (EMBRACE PRO GLUCOSE TEST) test strip Use as instructed 04/19/22   Merna Darleene, NP  insulin  aspart (NOVOLOG  FLEXPEN) 100 UNIT/ML FlexPen 2-3 units with meals 3 times a day plus sliding scale as instructed, maximum 20units/day Patient taking differently: 5-6 units with meals 3 times a day plus sliding scale as instructed, maximum 20units/day 12/15/22   Thapa, [place], MD  insulin  glargine (LANTUS  SOLOSTAR) 100 UNIT/ML Solostar Pen INJECT 20 UNITS INTO THE SKIN 2 TIMES A DAY Patient taking differently: INJECT  16 UNITS INTO THE SKIN daily 02/01/23   Nafziger, Darleene, NP  insulin  lispro (HUMALOG  KWIKPEN) 100 UNIT/ML KwikPen 2-3 units with meals 3 times a day plus sliding scale as instructed, maximum 20 units / day. 12/14/22   Thapa, [place], MD  Insulin  Pen Needle (DROPLET PEN NEEDLES) 31G X 6 MM MISC Use to check blood sugar TID 03/01/23   Merna Darleene, NP  Insulin  Syringe-Needle U-100 (INSULIN  SYRINGE .3CC/30GX5/16) 30G X 5/16 0.3 ML MISC Use for insulin  administration three times a day 11/21/22   Nafziger, Cory, NP  lidocaine -prilocaine  (EMLA ) cream Apply to affected area once 08/27/23   Timmy Maude SAUNDERS, MD  lipase/protease/amylase (CREON ) 36000 UNITS CPEP capsule TAKE TWO CAPSULES BY MOUTH THREE TIMES A DAY WITH MEALS. ALSO MAY TAKE ONE CAPSULE AS NEEDED WITH SNACKS (UP TO 4 SNACKS PER DAY) 05/15/23   Timmy Maude SAUNDERS, MD  OLANZapine  (ZYPREXA ) 5 MG tablet Take 1 tablet (5 mg total) by mouth at bedtime. 10/23/22   Timmy Maude SAUNDERS, MD  ondansetron  (ZOFRAN ) 8 MG tablet Take 1 tablet (8 mg total) by mouth every 8 (eight) hours as needed for nausea or vomiting. 08/27/23   Timmy Maude SAUNDERS, MD  pantoprazole  (PROTONIX ) 40 MG tablet Take 1 tablet (40 mg total) by mouth 2 (two) times daily before a meal. 09/06/23 09/05/24  Ennever, Maude SAUNDERS, MD  prochlorperazine  (COMPAZINE ) 10 MG tablet Take 1 tablet (10 mg total) by mouth every 6 (six) hours as needed  for nausea or vomiting. 08/27/23   Timmy Maude SAUNDERS, MD  saccharomyces boulardii (FLORASTOR) 250 MG capsule Take 250 mg by mouth 2 (two) times daily.    [provider]  VITAMIN D, CHOLECALCIFEROL, PO Take by mouth 2 (two) times daily.    [provider]    Allergies: Metformin  and related and Metformin  hcl    Review of Systems Negative except as per HPI Updated Vital Signs BP (!) 148/80   Pulse 63   Temp 97.6 F (36.4 C) (Oral)   Resp (!) 22   SpO2 100%   Physical Exam Vitals and nursing note reviewed.  Constitutional:      General: He is  not in acute distress.    Appearance: He is well-developed. He is not diaphoretic.  HENT:     Head: Normocephalic and atraumatic.  Cardiovascular:     Rate and Rhythm: Normal rate and regular rhythm.     Heart sounds: Normal heart sounds.  Pulmonary:     Effort: Pulmonary effort is normal.     Breath sounds: Normal breath sounds.  Abdominal:     Palpations: Abdomen is soft.     Tenderness: There is generalized abdominal tenderness and tenderness in the epigastric area.  Skin:    General: Skin is warm and dry.     Findings: No erythema or rash.  Neurological:     Mental Status: He is alert and oriented to person, place, and time.  Psychiatric:        Behavior: Behavior normal.     (all labs ordered are listed, but only abnormal results are displayed) Labs Reviewed  BASIC METABOLIC PANEL WITH GFR - Abnormal; Notable for the following components:      Result Value   Glucose, Bld 153 (*)    All other components within normal limits  CBC - Abnormal; Notable for the following components:   RBC 4.15 (*)    All other components within normal limits  I-STAT CHEM 8, ED - Abnormal; Notable for the following components:   Glucose, Bld 156 (*)    All other components within normal limits  HEPATIC FUNCTION PANEL  LIPASE, BLOOD  TROPONIN I (HIGH SENSITIVITY)  TROPONIN I (HIGH SENSITIVITY)    EKG: None  Radiology: DG Chest 2 View Result Date: 09/15/2023 CLINICAL DATA:  Chest pain EXAM: CHEST - 2 VIEW COMPARISON:  10/13/2022 FINDINGS: Cardiac shadows within normal limits. Right chest wall port is noted in satisfactory position. Lungs are clear. Postsurgical changes in the right base are noted. No acute bony abnormality is noted. IMPRESSION: No acute abnormality noted. Electronically Signed   By: Oneil Devonshire M.D.   On: 09/15/2023 04:06     Procedures   Medications Ordered in the ED  lidocaine -prilocaine  (EMLA ) cream (has no administration in time range)  sodium chloride  flush  (NS) 0.9 % injection 10-40 mL (has no administration in time range)  sodium chloride  flush (NS) 0.9 % injection 10-40 mL (has no administration in time range)  Chlorhexidine  Gluconate Cloth 2 % PADS 6 each (has no administration in time range)  ondansetron  (ZOFRAN ) injection 4 mg (4 mg Intravenous Given 09/15/23 0538)  morphine  (PF) 4 MG/ML injection 4 mg (4 mg Intravenous Given 09/15/23 0538)                                    Medical Decision Making Amount and/or Complexity of Data Reviewed  Labs: ordered. Radiology: ordered.  Risk Prescription drug management.   This patient presents to the ED for concern of abdominal pain, this involves an extensive number of treatment options, and is a complaint that carries with it a high risk of complications and morbidity.  The differential diagnosis includes but not limited to ACS, pancreatitis, stent malfunction, gastric outlet obstruction   Co morbidities / Chronic conditions that complicate the patient evaluation  Pancreatic cancer (on chemo break, does radiation 5 days/week), HLD, polycythemia vera, HTN diverticulosis, CAD   Additional history obtained:  Additional history obtained from EMR External records from outside source obtained and reviewed including prior labs and imaging on file   Lab Tests:  I Ordered, and personally interpreted labs.  The pertinent results include: Initial troponin is 9, will trend.  CBC without significant findings.  BMP with mildly elevated glucose 153.  Add on hepatic function and lipase pending   Imaging Studies ordered:  I ordered imaging studies including chest x-ray, CT abdomen pelvis I independently visualized and interpreted imaging which showed chest x-ray without acute process.  CT pending at time of signout to oncoming provider I agree with the radiologist interpretation   Cardiac Monitoring: / EKG:  The patient was maintained on a cardiac monitor.  I personally viewed and interpreted  the cardiac monitored which showed an underlying rhythm of: Sinus rhythm, rate 61   Problem List / ED Course / Critical interventions / Medication management  83 year old male with history of pancreatic cancer presents with complaint of epigastric abdominal pain which woke him from his sleep at midnight without vomiting, changes in bowel or bladder habits.  Patient states he has a biliary stent and his pain feels similar to when he had malfunction of the stent in the past.  He is provided with pain medication.  Care signed out at change of shift pending CT and additional labs. I ordered medication including morphine , Zofran  I have reviewed the patients home medicines and have made adjustments as needed   Social Determinants of Health:  Lives with family   Test / Admission - Considered:  Disposition pending at time of signout      Final diagnoses:  Epigastric pain    ED Discharge Orders     None          Beverley Leita LABOR, PA-C 09/15/23 0601    Franklyn Sid SAILOR, MD 09/15/23 [phone]

## 2023-09-15 NOTE — Progress Notes (Signed)
 Patient is on continuous blood sugar monitor and care orders placed by the provider to use its numbers in order to give insulin .

## 2023-09-15 NOTE — Progress Notes (Signed)
 Patient refused to have  SCD

## 2023-09-15 NOTE — ED Provider Notes (Signed)
 Accepted handoff at shift change from Leita Chancy PA-C. Please see prior provider note for more detail.   Briefly: Patient is 83 y.o.   DDX: concern for severe abdominal pain radiating to back.  On chemo for pancreatic cancer.  Reports that it feels similar to when he had a previous biliary obstruction.  Plan: CT abdomen pelvis with biliary stent in place, no evidence of blockage, but there is some new growth surrounding the area, hepatic function panel with new transaminitis.  Patient with no significant improvement of pain with morphine , nausea with Zofran .  Will administer repeat doses of pain, nausea medication.  Think would be reasonable to admit patient for intractable pain, nausea in context of known pancreatic cancer.  Spoke with hospitalist, who requested consult with GI, Zosyn , fluids.  Agrees to admission otherwise though. Dr. Nandigan with GI agrees to consult during evaluation.    Rosan Sherlean DEL, PA-C 09/15/23 0813    Towana Ozell BROCKS, MD 09/15/23 [phone]

## 2023-09-15 NOTE — Progress Notes (Signed)
 MD contacted due to pt not taking zyprexa . MD discontinued it. It was on his home med list. Pt never took it. Pt is to have family bring in his mouth rinse Peridex  as pharmacy doesn't have it.  Pt stated he didn't get any insulin  today.  I looked at mar and reminded him that he did. Pt states he can't eat food because everything tastes terrible.  States it's from his chemo treatments.

## 2023-09-15 NOTE — Consult Note (Addendum)
 Consultation  Referring Provider: TRH/Samtani Primary Care Physician:  Merna Huxley, NP Primary Gastroenterologist:  Dr. Clarke  Reason for Consultation: Pancreatic adenocarcinoma with acute abdominal pain  HPI: Alexander [name] is a 83 y.o. male with diagnosis of pancreatic adenocarcinoma, now metastatic to the lung with initial diagnosis August 2024 when he presented with jaundice.  He was found to have a malignant appearing biliary stricture at ERCP and initially had plastic stent placement.  Subsequent EUS in September 2024, then required repeat ERCP when he presented with acute abdominal pain and elevated LFTs.  He was found to have an occluded stent at ERCP.  The stent was removed and he had a 10 mm x 6 cm partially covered metal stent placed 5 cm into the common bile duct, pus flowed through the stent. He is followed by Dr. Timmy, and is now being treated with chemoradiation.  He says he had not been feeling well over the past couple weeks and chemotherapy was paused. Not been having any abdominal pain however or nausea or vomiting until he was awakened at 1 AM today with epigastric pain radiating into his back.  He says this was very similar to his previous episode when his stent had become infected/occluded.  The pain persisted, became more severe was associated with nausea and he came on to the emergency room.  Labs today-WBC 7.3/hemoglobin 13.1/hematocrit 39.5/platelets 195 Troponins negative Potassium 4.0/BUN 15/creatinine 1.04 Lipase normal T. bili 1.9/alk phos 219/AST 333/ALT 180 Chest x-ray negative  CT of the abdomen with contrast today shows Port-A-Cath in the right atrium, right lung nodules ranging from 7 to 9 mm have progressed in size from last year, metal CBD stent in place, mild gallbladder distention, intrahepatic biliary ductal dilation not significantly changed, less pneumobilia.  Atrophied pancreatic body and tail with chronic pancreatic ductal  dilation regressed from last year diminutive head and uncinate 8.  There is greater degree of soft tissue along the distal CBD stent and soft tissue there appears somewhat spiculated..  Patient currently feeling better on analgesic and without pain at present.  Remains afebrile  No blood thinner use.  Other medical problems include history of polycythemia, coronary artery disease hypertension, hyperlipidemia, and hyperglycemia.  Past Medical History:  Diagnosis Date   Arthritis    minor; shoulders primarily (05/20/2013)   CAD (coronary artery disease)    a. 05/2013 - Chest pain/unstable angina and dynamic EKG changes showing cath showing atherosclerotic coronary artery disease manifested as diffuse ectasia, normal EF.   Diverticulosis of colon    GERD (gastroesophageal reflux disease)    GI bleed    secondary to Aspirin    Gout    Hematuria, microscopic    Hemorrhoids    Hyperglycemia    Hyperlipidemia    Hypertension    Lumbar back pain    Microalbuminuria    Overweight(278.02)    Pancreatic cancer (HCC) 09/22/2022   Polycythemia rubra vera (HCC)    Tubular adenoma of colon 07/2012    Past Surgical History:  Procedure Laterality Date   BILIARY STENT PLACEMENT N/A 09/02/2022   Procedure: BILIARY STENT PLACEMENT;  Surgeon: Abran Norleen SAILOR, MD;  Location: [facility];  Service: Gastroenterology;  Laterality: N/A;   BILIARY STENT PLACEMENT  10/13/2022   Procedure: BILIARY STENT PLACEMENT;  Surgeon: Aneita Gwendlyn DASEN, MD;  Location: [facility];  Service: Gastroenterology;;   BIOPSY  09/11/2022   Procedure: BIOPSY;  Surgeon: Wilhelmenia Aloha Raddle., MD;  Location: [facility];  Service: Gastroenterology;;  CARDIAC CATHETERIZATION  05/20/2013   CATARACT EXTRACTION W/ INTRAOCULAR LENS  IMPLANT, BILATERAL Bilateral 2008   CYSTECTOMY  ~ 2000    SEBACEOUS cyst removed from between shoulder blades   ENDOSCOPIC RETROGRADE CHOLANGIOPANCREATOGRAPHY (ERCP) WITH PROPOFOL  N/A 09/02/2022    Procedure: ENDOSCOPIC RETROGRADE CHOLANGIOPANCREATOGRAPHY (ERCP) WITH PROPOFOL ;  Surgeon: Abran Norleen SAILOR, MD;  Location: [facility];  Service: Gastroenterology;  Laterality: N/A;   ENDOSCOPIC RETROGRADE CHOLANGIOPANCREATOGRAPHY (ERCP) WITH PROPOFOL  N/A 10/13/2022   Procedure: ENDOSCOPIC RETROGRADE CHOLANGIOPANCREATOGRAPHY (ERCP) WITH PROPOFOL ;  Surgeon: Aneita Gwendlyn DASEN, MD;  Location: [facility] ENDOSCOPY;  Service: Gastroenterology;  Laterality: N/A;   ESOPHAGOGASTRODUODENOSCOPY N/A 09/11/2022   Procedure: ESOPHAGOGASTRODUODENOSCOPY (EGD);  Surgeon: Wilhelmenia Aloha Raddle., MD;  Location: [facility];  Service: Gastroenterology;  Laterality: N/A;   EUS N/A 09/11/2022   Procedure: UPPER ENDOSCOPIC ULTRASOUND (EUS) RADIAL;  Surgeon: Wilhelmenia Aloha Raddle., MD;  Location: [facility];  Service: Gastroenterology;  Laterality: N/A;   EXCISIONAL HEMORRHOIDECTOMY  1970's   FINE NEEDLE ASPIRATION N/A 09/11/2022   Procedure: FINE NEEDLE ASPIRATION (FNA) LINEAR;  Surgeon: Wilhelmenia Aloha Raddle., MD;  Location: [facility];  Service: Gastroenterology;  Laterality: N/A;   INGUINAL HERNIA REPAIR Right ~ 2010   IR IMAGING GUIDED PORT INSERTION  10/27/2022   LEFT HEART CATHETERIZATION WITH CORONARY ANGIOGRAM N/A 05/20/2013   Procedure: LEFT HEART CATHETERIZATION WITH CORONARY ANGIOGRAM;  Surgeon: Peter M Swaziland, MD;  Location: [facility] C[facility] LAB;  Service: Cardiovascular;  Laterality: N/A;   SPHINCTEROTOMY  09/02/2022   Procedure: SPHINCTEROTOMY;  Surgeon: Abran Norleen SAILOR, MD;  Location: [facility];  Service: Gastroenterology;;   CLEDA REMOVAL  10/13/2022   Procedure: STENT REMOVAL;  Surgeon: Aneita Gwendlyn DASEN, MD;  Location: [facility];  Service: Gastroenterology;;   TONSILLECTOMY AND ADENOIDECTOMY  1940's   VASECTOMY      Prior to Admission medications   Medication Sig Start Date End Date Taking? Authorizing Provider  Accu-Chek Softclix Lancets lancets Use to test blood glucose once daily Patient taking differently: 1  each by Other route as needed for other (Check blood sugar). 11/09/21  Yes Nafziger, Darleene, NP  amLODipine  (NORVASC ) 10 MG tablet Take 1 tablet (10 mg total) by mouth daily. Patient taking differently: Take 10 mg by mouth every evening. 12/15/22  Yes Nafziger, Darleene, NP  amoxicillin  (AMOXIL ) 250 MG capsule Take 250 mg by mouth 3 (three) times daily. 09/10/23  Yes [provider]  BD PEN NEEDLE NANO 2ND GEN 32G X 4 MM MISC  03/15/23  Yes [provider]  chlorhexidine  (PERIDEX ) 0.12 % solution Use as directed 10 mLs in the mouth or throat 2 (two) times daily. 09/10/23  Yes [provider]  Cholecalciferol (VITAMIN D3) 50 MCG (2000 UT) CAPS Take 2,000 Units by mouth with breakfast, with lunch, and with evening meal.   Yes [provider]  Continuous Glucose Sensor (FREESTYLE LIBRE 3 PLUS SENSOR) MISC Change sensor every 15 days. 03/01/23  Yes Nafziger, Darleene, NP  Glucagon  (GVOKE HYPOPEN  1-PACK) 1 MG/0.2ML SOAJ Inject 1 mg into the skin as needed (low blood sugar with impaired consciousness). 12/14/22  Yes Thapa, [place], MD  glucose blood (EMBRACE PRO GLUCOSE TEST) test strip Use as instructed 04/19/22  Yes Nafziger, Darleene, NP  insulin  aspart (NOVOLOG  FLEXPEN) 100 UNIT/ML FlexPen 2-3 units with meals 3 times a day plus sliding scale as instructed, maximum 20units/day Patient taking differently: 5-6 units with meals 3 times a day plus sliding scale as instructed, maximum 20units/day 12/15/22  Yes Thapa, [place], MD  insulin  glargine (  LANTUS  SOLOSTAR) 100 UNIT/ML Solostar Pen INJECT 20 UNITS INTO THE SKIN 2 TIMES A DAY Patient taking differently: Inject 16 Units into the skin daily. INJECT 16 UNITS INTO THE SKIN daily 02/01/23  Yes Nafziger, Darleene, NP  Insulin  Pen Needle (DROPLET PEN NEEDLES) 31G X 6 MM MISC Use to check blood sugar TID 03/01/23  Yes Nafziger, Darleene, NP  lipase/protease/amylase (CREON ) 36000 UNITS CPEP capsule TAKE TWO CAPSULES BY MOUTH THREE TIMES A DAY WITH MEALS. ALSO  MAY TAKE ONE CAPSULE AS NEEDED WITH SNACKS (UP TO 4 SNACKS PER DAY) Patient taking differently: Take 36,000 Units by mouth See admin instructions. Takes three times a day plus one if he is having a snack 05/15/23  Yes Ennever, Maude SAUNDERS, MD  pantoprazole  (PROTONIX ) 40 MG tablet Take 1 tablet (40 mg total) by mouth 2 (two) times daily before a meal. Patient taking differently: Take 40 mg by mouth in the morning, at noon, and at bedtime. 09/06/23 09/05/24 Yes Ennever, Maude SAUNDERS, MD  saccharomyces boulardii (FLORASTOR) 250 MG capsule Take 250 mg by mouth 2 (two) times daily.   Yes [provider]    Current Facility-Administered Medications  Medication Dose Route Frequency Provider Last Rate Last Admin   0.9 %  sodium chloride  infusion   Intravenous Continuous Samtani, Jai-Gurmukh, MD 100 mL/hr at 09/15/23 0824 New Bag at 09/15/23 0824   amLODipine  (NORVASC ) tablet 10 mg  10 mg Oral Daily Samtani, Jai-Gurmukh, MD   10 mg at 09/15/23 1012   Chlorhexidine  Gluconate Cloth 2 % PADS 6 each  6 each Topical Daily Franklyn Sid SAILOR, MD       insulin  glargine (LANTUS ) injection 10 Units  10 Units Subcutaneous BID Samtani, Jai-Gurmukh, MD       lipase/protease/amylase (CREON ) capsule 36,000 Units  36,000 Units Oral TID AC Samtani, Jai-Gurmukh, MD   36,000 Units at 09/15/23 1151   morphine  (PF) 2 MG/ML injection 2 mg  2 mg Intravenous Q2H PRN Samtani, Jai-Gurmukh, MD       OLANZapine  (ZYPREXA ) tablet 5 mg  5 mg Oral QHS Samtani, Jai-Gurmukh, MD       ondansetron  (ZOFRAN ) tablet 8 mg  8 mg Oral Q8H PRN Samtani, Jai-Gurmukh, MD       oxyCODONE  (Oxy IR/ROXICODONE ) immediate release tablet 5 mg  5 mg Oral Q4H PRN Samtani, Jai-Gurmukh, MD       pantoprazole  (PROTONIX ) EC tablet 40 mg  40 mg Oral BID AC Samtani, Jai-Gurmukh, MD   40 mg at 09/15/23 0848   piperacillin -tazobactam (ZOSYN ) IVPB 3.375 g  3.375 g Intravenous Q8H Samtani, Jai-Gurmukh, MD   Stopped at 09/15/23 9046   prochlorperazine  (COMPAZINE ) tablet 10 mg   10 mg Oral Q6H PRN Samtani, Jai-Gurmukh, MD       sodium chloride  flush (NS) 0.9 % injection 10-40 mL  10-40 mL Intracatheter Q12H Franklyn Sid SAILOR, MD       sodium chloride  flush (NS) 0.9 % injection 10-40 mL  10-40 mL Intracatheter PRN Franklyn Sid SAILOR, MD       Current Outpatient Medications  Medication Sig Dispense Refill   Accu-Chek Softclix Lancets lancets Use to test blood glucose once daily (Patient taking differently: 1 each by Other route as needed for other (Check blood sugar).) 100 each 3   amLODipine  (NORVASC ) 10 MG tablet Take 1 tablet (10 mg total) by mouth daily. (Patient taking differently: Take 10 mg by mouth every evening.) 90 tablet 3   amoxicillin  (AMOXIL ) 250 MG capsule Take 250  mg by mouth 3 (three) times daily.     BD PEN NEEDLE NANO 2ND GEN 32G X 4 MM MISC      chlorhexidine  (PERIDEX ) 0.12 % solution Use as directed 10 mLs in the mouth or throat 2 (two) times daily.     Cholecalciferol (VITAMIN D3) 50 MCG (2000 UT) CAPS Take 2,000 Units by mouth with breakfast, with lunch, and with evening meal.     Continuous Glucose Sensor (FREESTYLE LIBRE 3 PLUS SENSOR) MISC Change sensor every 15 days. 2 each 6   Glucagon  (GVOKE HYPOPEN  1-PACK) 1 MG/0.2ML SOAJ Inject 1 mg into the skin as needed (low blood sugar with impaired consciousness). 0.4 mL 2   glucose blood (EMBRACE PRO GLUCOSE TEST) test strip Use as instructed 100 each 12   insulin  aspart (NOVOLOG  FLEXPEN) 100 UNIT/ML FlexPen 2-3 units with meals 3 times a day plus sliding scale as instructed, maximum 20units/day (Patient taking differently: 5-6 units with meals 3 times a day plus sliding scale as instructed, maximum 20units/day) 15 mL 11   insulin  glargine (LANTUS  SOLOSTAR) 100 UNIT/ML Solostar Pen INJECT 20 UNITS INTO THE SKIN 2 TIMES A DAY (Patient taking differently: Inject 16 Units into the skin daily. INJECT 16 UNITS INTO THE SKIN daily) 12 mL 0   Insulin  Pen Needle (DROPLET PEN NEEDLES) 31G X 6 MM MISC Use to check blood  sugar TID 100 each 0   lipase/protease/amylase (CREON ) 36000 UNITS CPEP capsule TAKE TWO CAPSULES BY MOUTH THREE TIMES A DAY WITH MEALS. ALSO MAY TAKE ONE CAPSULE AS NEEDED WITH SNACKS (UP TO 4 SNACKS PER DAY) (Patient taking differently: Take 36,000 Units by mouth See admin instructions. Takes three times a day plus one if he is having a snack) 200 capsule 6   pantoprazole  (PROTONIX ) 40 MG tablet Take 1 tablet (40 mg total) by mouth 2 (two) times daily before a meal. (Patient taking differently: Take 40 mg by mouth in the morning, at noon, and at bedtime.) 730 tablet 0   saccharomyces boulardii (FLORASTOR) 250 MG capsule Take 250 mg by mouth 2 (two) times daily.      Allergies as of 09/15/2023 - Review Complete 09/15/2023  Allergen Reaction Noted   Metformin  and related Diarrhea 06/06/2012   Metformin  hcl Diarrhea 10/07/2020    Family History  Problem Relation Age of Onset   Colon cancer Mother        died at 69, colorectal   Diabetes Mother    Heart disease Father 75       MI   Diabetes Father    Stomach cancer Maternal Grandfather    Cancer Maternal Uncle     Social History   Socioeconomic History   Marital status: Married    Spouse name: Not on file   Number of children: 3   Years of education: Not on file   Highest education level: Some college, no degree  Occupational History   Occupation: retired Garment/textile technologist: RETIRED  Tobacco Use   Smoking status: Never   Smokeless tobacco: Never   Tobacco comments:    NEVER USED TOBACCO  Vaping Use   Vaping status: Never Used  Substance and Sexual Activity   Alcohol use: No    Alcohol/week: 0.0 standard drinks of alcohol   Drug use: No   Sexual activity: Not Currently    Partners: Female  Other Topics Concern   Not on file  Social History Narrative   Marriedfor 52 years  Daughter, Francene (pediatrician--lives in [place], [place]), one in [place] - Warden/ranger. One in [place] - Chartered loss adjuster          Social Drivers of Health    Financial Resource Strain: Low Risk  (08/11/2023)   Overall Financial Resource Strain (CARDIA)    Difficulty of Paying Living Expenses: Not hard at all  Food Insecurity: No Food Insecurity (08/20/2023)   Hunger Vital Sign    Worried About Running Out of Food in the Last Year: Never true    Ran Out of Food in the Last Year: Never true  Transportation Needs: No Transportation Needs (08/20/2023)   PRAPARE - Administrator, Civil Service (Medical): No    Lack of Transportation (Non-Medical): No  Physical Activity: Sufficiently Active (08/11/2023)   Exercise Vital Sign    Days of Exercise per Week: 7 days    Minutes of Exercise per Session: 90 min  Stress: No Stress Concern Present (08/11/2023)   [employer] of Occupational Health - Occupational Stress Questionnaire    Feeling of Stress: Not at all  Social Connections: Socially Integrated (08/11/2023)   Social Connection and Isolation Panel    Frequency of Communication with Friends and Family: More than three times a week    Frequency of Social Gatherings with Friends and Family: More than three times a week    Attends Religious Services: 1 to 4 times per year    Active Member of [employer] or Organizations: Yes    Attends Banker Meetings: 1 to 4 times per year    Marital Status: Married  Catering manager Violence: Not At Risk (08/20/2023)   Humiliation, Afraid, Rape, and Kick questionnaire    Fear of Current or Ex-Partner: No    Emotionally Abused: No    Physically Abused: No    Sexually Abused: No    Review of Systems: Pertinent positive and negative review of systems were noted in the above HPI section.  All other review of systems was otherwise negative. SABRA  Physical Exam: Vital signs in last 24 hours: Temp:  [97.6 F (36.4 C)-97.8 F (36.6 C)] 97.8 F (36.6 C) (09/13 1132) Pulse Rate:  [63-93] 73 (09/13 1132) Resp:  [14-22] 14 (09/13 1132) BP: (128-186)/(74-91) 128/76 (09/13 1132) SpO2:  [95 %-100 %]  100 % (09/13 1132) Weight:  [70.3 kg] 70.3 kg (09/13 0746)   General:   Alert,  Well-developed, well-nourished, thin, elderly white male pleasant and cooperative in NAD Head:  Normocephalic and atraumatic. Eyes:  Sclera clear, no icterus.   Conjunctiva pink. Ears:  Normal auditory acuity. Nose:  No deformity, discharge,  or lesions. Mouth:  No deformity or lesions.   Neck:  Supple; no masses or thyromegaly. Lungs:  Clear throughout to auscultation.   No wheezes, crackles, or rhonchi.  Heart:  Regular rate and rhythm; no murmurs, clicks, rubs,  or gallops. Abdomen:  Soft,nontender, BS active,nonpalp mass or hsm.   Rectal: Not done Msk:  Symmetrical without gross deformities. . Pulses:  Normal pulses noted. Extremities:  Without clubbing or edema. Neurologic:  Alert and  oriented x4;  grossly normal neurologically. Skin:  Intact without significant lesions or rashes.. Psych:  Alert and cooperative. Normal mood and affect.  Intake/Output from previous day: No intake/output data recorded. Intake/Output this shift: No intake/output data recorded.  Lab Results: Recent Labs    09/15/23 0322 09/15/23 0345  WBC  --  7.3  HGB 13.6 13.1  HCT 40.0 39.5  PLT  --  195  BMET Recent Labs    09/15/23 0322 09/15/23 0345  NA 142 141  K 4.0 4.0  CL 103 104  CO2  --  23  GLUCOSE 156* 153*  BUN 17 15  CREATININE 1.10 1.04  CALCIUM   --  9.4   LFT Recent Labs    09/15/23 0550  PROT 6.5  ALBUMIN 3.4*  AST 333*  ALT 180*  ALKPHOS 219*  BILITOT 1.9*  BILIDIR 1.0*  IBILI 0.9   PT/INR No results for input(s): LABPROT, INR in the last 72 hours. Hepatitis Panel No results for input(s): HEPBSAG, HCVAB, HEPAIGM, HEPBIGM in the last 72 hours.    IMPRESSION:  #25 83 year old white male diagnosed with pancreatic adenocarcinoma August 2024 he presented with jaundice. Found to have a malignant appearing biliary stricture at ERCP, and plastic biliary stent was  placed.  EUS September 2024 mass in the pancreatic head FNA was done stage T2 N0 MX He  presented October 2024 with acute abdominal pain and LFTs. Repeat ERCP with finding of stent occlusion and infection, plastic stent removed and partially covered metal stent placed.  Has been undergoing chemoradiation under the care of Dr. Timmy, paused over the past couple of weeks due to the patient feeling fatigued.  Patient now presents with acute onset of severe epigastric pain radiating to his back onset 1 AM today.  Pain is very similar to when he had previous stent occlusion  LFTs elevated with T. bili 1.9 No leukocytosis No SIRS criteria  CT with unchanged intrahepatic biliary ductal dilation but there does appear to be increased degree of soft tissue along the distal common bile duct stent and this appears somewhat spiculated.  He has also had an increase in size of right lung nodules. Suspect he has had progression of the pancreatic mass, with secondary stent occlusion  #2 history of polycythemia #3.  History of hypertension #4.  Coronary artery disease #5 hyperglycemia  Plan; OK for regular diet Agree with IV Unasyn , as definitely at risk for cholangitis Trend daily labs Patient will need repeat ERCP, with probable stent removal and replacement of new stent versus placement of additional stent inside metal stent. We do not have ERCP availability this weekend, ERCP likely Monday afternoon.  Plans have been discussed in detail with the patient at bedside today. GI will follow with you.      Amy EsterwoodPA-C  09/15/2023, 12:50 PM   Attending physician's note  I personally saw the patient and performed a substantive portion of the medical decision making process for this encounter (including a complete performance of the key components : MDM, Hx and Exam), in conjunction with the APP.  I agree with the APP's note, impression, and  the management plan for the number and complexity of  problems addressed at the encounter for the patient and take responsibility for that plan with its inherent risk of complications, morbidity, or mortality with additional input as follows.    83 year old male with pancreatic adenocarcinoma metastatic to lung diagnosed in 2024 s/p chemoradiation, he started back on chemoradiation in August 2025 presented to ER with worsening abdominal pain since last night  On exam abdomen soft no tenderness or distention, no rebound  LFT elevated with total bilirubin 1.9.  No leukocytosis or fever to suggest cholangitis  Presentation is concerning for biliary stent occlusion IV broad-spectrum antibiotics Monitor for sepsis/cholangitis Continue soft diet as tolerated  No biliary coverage this weekend, tentatively plan for ERCP on Monday with Dr. Wilhelmenia  High complex medical decision making (this includes chart review, review of results, face-to-face time used for counseling as well as treatment plan and follow-up. The patient was provided an opportunity to ask questions and all were answered. The patient agreed with the plan and demonstrated an understanding of the instructions.  LOIS Wilkie Mcgee , MD [phone]

## 2023-09-15 NOTE — ED Triage Notes (Signed)
 Pt bib POV with spouse c/o epigastric pain with radiation to the back that started about 1:30. Pt took two Tylenol  without relief. Pt states he is on chemo and haven't had problems in the past.

## 2023-09-15 NOTE — Progress Notes (Signed)
 Patient transferred from ED. Alert and oriented. On RA. Remains in the bed. Family at bed side.

## 2023-09-16 DIAGNOSIS — K8050 Calculus of bile duct without cholangitis or cholecystitis without obstruction: Secondary | ICD-10-CM

## 2023-09-16 DIAGNOSIS — R932 Abnormal findings on diagnostic imaging of liver and biliary tract: Secondary | ICD-10-CM

## 2023-09-16 DIAGNOSIS — C250 Malignant neoplasm of head of pancreas: Secondary | ICD-10-CM | POA: Diagnosis not present

## 2023-09-16 LAB — CBC WITH DIFFERENTIAL/PLATELET
Abs Immature Granulocytes: 0.11 K/uL — ABNORMAL HIGH (ref 0.00–0.07)
Basophils Absolute: 0 K/uL (ref 0.0–0.1)
Basophils Relative: 0 %
Eosinophils Absolute: 0 K/uL (ref 0.0–0.5)
Eosinophils Relative: 0 %
HCT: 33 % — ABNORMAL LOW (ref 39.0–52.0)
Hemoglobin: 11.2 g/dL — ABNORMAL LOW (ref 13.0–17.0)
Immature Granulocytes: 1 %
Lymphocytes Relative: 2 %
Lymphs Abs: 0.3 K/uL — ABNORMAL LOW (ref 0.7–4.0)
MCH: 31.5 pg (ref 26.0–34.0)
MCHC: 33.9 g/dL (ref 30.0–36.0)
MCV: 92.7 fL (ref 80.0–100.0)
Monocytes Absolute: 1.6 K/uL — ABNORMAL HIGH (ref 0.1–1.0)
Monocytes Relative: 13 %
Neutro Abs: 10.2 K/uL — ABNORMAL HIGH (ref 1.7–7.7)
Neutrophils Relative %: 84 %
Platelets: 162 K/uL (ref 150–400)
RBC: 3.56 MIL/uL — ABNORMAL LOW (ref 4.22–5.81)
RDW: 14.3 % (ref 11.5–15.5)
WBC: 12.2 K/uL — ABNORMAL HIGH (ref 4.0–10.5)
nRBC: 0 % (ref 0.0–0.2)

## 2023-09-16 LAB — COMPREHENSIVE METABOLIC PANEL WITH GFR
ALT: 313 U/L — ABNORMAL HIGH (ref 0–44)
AST: 248 U/L — ABNORMAL HIGH (ref 15–41)
Albumin: 2.8 g/dL — ABNORMAL LOW (ref 3.5–5.0)
Alkaline Phosphatase: 196 U/L — ABNORMAL HIGH (ref 38–126)
Anion gap: 11 (ref 5–15)
BUN: 12 mg/dL (ref 8–23)
CO2: 24 mmol/L (ref 22–32)
Calcium: 8.7 mg/dL — ABNORMAL LOW (ref 8.9–10.3)
Chloride: 100 mmol/L (ref 98–111)
Creatinine, Ser: 1.08 mg/dL (ref 0.61–1.24)
GFR, Estimated: >60 mL/min (ref >60)
Glucose, Bld: 128 mg/dL — ABNORMAL HIGH (ref 70–99)
Potassium: 4.1 mmol/L (ref 3.5–5.1)
Sodium: 135 mmol/L (ref 135–145)
Total Bilirubin: 3.6 mg/dL — ABNORMAL HIGH (ref 0.0–1.2)
Total Protein: 5.7 g/dL — ABNORMAL LOW (ref 6.5–8.1)

## 2023-09-16 LAB — PROTIME-INR
INR: 1.4 — ABNORMAL HIGH (ref 0.8–1.2)
Prothrombin Time: 17.5 s — ABNORMAL HIGH (ref 11.4–15.2)

## 2023-09-16 MED ORDER — SODIUM CHLORIDE 0.9 % IV SOLN
12.5000 mg | Freq: Three times a day (TID) | INTRAVENOUS | Status: DC | PRN
  Administered 2023-09-16: 12.5 mg via INTRAVENOUS
  Filled 2023-09-16 (×2): qty 0.5

## 2023-09-16 MED ORDER — DICLOFENAC SUPPOSITORY 100 MG
100.0000 mg | Freq: Once | RECTAL | Status: DC
  Filled 2023-09-16 (×2): qty 1

## 2023-09-16 NOTE — Progress Notes (Addendum)
 [facility] ROUNDING NOTE   Subjective: C/o worsening abd pain, fever, nausea and no appetite   Objective: Vital signs in last 24 hours: Temp:  [97.8 F (36.6 C)-100.1 F (37.8 C)] 100.1 F (37.8 C) (09/14 1303) Pulse Rate:  [68-73] 70 (09/14 1303) Resp:  [16-18] 16 (09/14 1303) BP: (133-141)/(74-76) 141/76 (09/14 1303) SpO2:  [98 %-100 %] 98 % (09/14 1303) Last BM Date : 09/15/23 General: NAD Lungs: b/l clear Heart: s1s2  Abdomen: soft, epigastric and upper abdomen mild tenderness, no distension Ext: No edema    Intake/Output from previous day: 09/13 0701 - 09/14 0700 In: 1604.3 [I.V.:1481; IV Piggyback:123.3] Out: -  Intake/Output this shift: No intake/output data recorded.   Lab Results: Recent Labs    09/15/23 0322 09/15/23 0345 09/16/23 0135  WBC  --  7.3 12.2*  HGB 13.6 13.1 11.2*  PLT  --  195 162  MCV  --  95.2 92.7   BMET Recent Labs    09/15/23 0322 09/15/23 0345 09/16/23 0135  NA 142 141 135  K 4.0 4.0 4.1  CL 103 104 100  CO2  --  23 24  GLUCOSE 156* 153* 128*  BUN 17 15 12   CREATININE 1.10 1.04 1.08  CALCIUM   --  9.4 8.7*   LFT Recent Labs    09/15/23 0550 09/16/23 0135  PROT 6.5 5.7*  ALBUMIN 3.4* 2.8*  AST 333* 248*  ALT 180* 313*  ALKPHOS 219* 196*  BILITOT 1.9* 3.6*  BILIDIR 1.0*  --   IBILI 0.9  --    PT/INR Recent Labs    09/16/23 0135  INR 1.4*      Imaging/Other results: CT ABDOMEN PELVIS W CONTRAST Result Date: 09/15/2023 CLINICAL DATA:  83 year old male with abdominal pain. Epigastric pain radiating to the back. Pancreatic cancer. * Tracking Code: BO * EXAM: CT ABDOMEN AND PELVIS WITH CONTRAST TECHNIQUE: Multidetector CT imaging of the abdomen and pelvis was performed using the standard protocol following bolus administration of intravenous contrast. RADIATION DOSE REDUCTION: This exam was performed according to the departmental dose-optimization program which includes automated exposure control,  adjustment of the mA and/or kV according to patient size and/or use of iterative reconstruction technique. CONTRAST:  75mL OMNIPAQUE  IOHEXOL  350 MG/ML SOLN COMPARISON:  CT Abdomen and Pelvis 10/04/2022.  CTA 10/13/2022. FINDINGS: Lower chest: Partially visible right chest Port-A-Cath at the right atrium, appears to be new from last year. Stable heart size. No pericardial effusion. No pleural effusion. Increased curvilinear enhancing atelectasis and scarring at the lung bases from last year. However, right lung nodules ranging from 7-9 mm on series 5, images 2 and 6 are new or progressed from last year. No other suspicious lung base nodule. Hepatobiliary: Metallic CBD stent now in place. Mild gallbladder distension similar to last year. Intrahepatic biliary ductal dilatation also not significantly changed from last year. Less pneumobilia. No discrete liver mass. Pancreas: Atrophied pancreatic body and tail with chronic pancreatic ductal dilatation which is regressed from last year. Diminutive head and uncinate. There is a greater degree of soft tissue along the distal CBD stent, and the soft tissue there appears somewhat spiculated on coronal image 55. When compared to 10/04/2022 this roughly 4 cm area is not obviously progressed. Spleen: Negative. Adrenals/Urinary Tract: Stable and negative: Stable and benign-appearing left renal midpole cyst (no follow up imaging recommended). Symmetric renal enhancement and contrast excretion. Decompressed ureters. Unremarkable bladder. Occasional pelvic phleboliths. Stomach/Bowel: Gas distended rectum. Upstream decompressed sigmoid colon with advanced diverticulosis. Advanced  diverticulosis in the descending colon. Occasional diverticula in the transverse colon. No active inflammation identified. Negative right colon. Normal gas containing appendix on series 3, image 57. Decompressed terminal ileum. Nondilated small bowel. Retained fluid and food, gas in the stomach. Duodenum  appears decompressed. No pneumoperitoneum, free fluid, or mesenteric inflammation identified. Vascular/Lymphatic: Aortoiliac calcified atherosclerosis. Major arterial structures remain patent. Stable mild tortuosity of the abdominal aorta. Portal venous system is patent. No lymphadenopathy identified. Reproductive: Chronic fat containing right inguinal hernia appears stable. Other: No pelvis free fluid. Musculoskeletal: Chronic spine degeneration, hyperostosis. No acute or suspicious osseous lesion identified. IMPRESSION: 1. Sequelae of Obstructing Pancreatic Tumor with metal CBD stent now in place. Intra- and extrahepatic biliary ductal dilatation not significantly changed from last year. Main pancreatic ductal dilatation has regressed. Spiculated pancreatic soft tissue along the distal CBD stent not obviously progressed since CTA on 10/04/2022. 2. However, two right lung base nodules ranging from 7 mm to 9 mm are new or increased from last year and suspicious for Pulmonary Metastatic Disease. 3. No other acute finding in the abdomen or pelvis. 4.  Aortic Atherosclerosis (ICD10-I70.0). Electronically Signed   By: VEAR Hurst M.D.   On: 09/15/2023 06:31   DG Chest 2 View Result Date: 09/15/2023 CLINICAL DATA:  Chest pain EXAM: CHEST - 2 VIEW COMPARISON:  10/13/2022 FINDINGS: Cardiac shadows within normal limits. Right chest wall port is noted in satisfactory position. Lungs are clear. Postsurgical changes in the right base are noted. No acute bony abnormality is noted. IMPRESSION: No acute abnormality noted. Electronically Signed   By: Oneil Devonshire M.D.   On: 09/15/2023 04:06      Assessment &Plan  83 year old very pleasant gentleman with pancreatic adenocarcinoma initially diagnosed in September 2024 status post chemoradiation, was restarted on chemoradiation few weeks ago CT abdomen pelvis with increased soft tissue along the distal common bile duct, appears spiculated and also has increase in right lobe  lung nodule size  His presentation is concerning for stent occlusion with possible progression of disease or biliary sludge  He is febrile and has worsening leukocytosis concerning for ascending cholangitis Worsening LFT Switched to IV Zosyn  from IV Unasyn  yesterday  Plan for urgent ERCP on Monday with Dr. Wilhelmenia N.p.o. after midnight Continue IV antibiotics Pain management and continue IV fluids   This visit required >35 minutes of patient care (this includes precharting, chart review, review of results, face-to-face time used for counseling as well as treatment plan and follow-up. The patient was provided an opportunity to ask questions and all were answered. The patient agreed with the plan and demonstrated an understanding of the instructions.   LOIS Wilkie Mcgee , MD [phone]  [facility]  Addendum Discussed his case with Dr. Charlanne, he is on-call tonight and also with Dr. Wilhelmenia, he is ERCP backup starting tomorrow. Patient made n.p.o. this afternoon given change in his clinical course, became febrile and is having chills Continue to monitor closely, please stat page GI Oncall with any change in symptoms

## 2023-09-16 NOTE — Plan of Care (Signed)
  Problem: Clinical Measurements: Goal: Will remain free from infection Outcome: Progressing   Problem: Nutrition: Goal: Adequate nutrition will be maintained Outcome: Progressing   Problem: Coping: Goal: Level of anxiety will decrease Outcome: Progressing   Problem: Safety: Goal: Ability to remain free from injury will improve Outcome: Progressing   Problem: Coping: Goal: Ability to adjust to condition or change in health will improve Outcome: Progressing   Problem: Metabolic: Goal: Ability to maintain appropriate glucose levels will improve Outcome: Progressing

## 2023-09-16 NOTE — Progress Notes (Addendum)
 [facility]   ROUNDING   NOTE ROMANO STIGGER FMW:[id number]  DOB: February 06, 1940  DOA: 09/15/2023  PCP: Merna Huxley, NP  09/16/2023,1:29 PM  LOS: 1 day    Code Status: DNR     from: Home   83Y very active home dwelling moved here from [place] retired Emergency planning/management officer Lives with wife-she has moderate dementia he takes care of her does all ADLs IDLs for her and is the main caregiver Swims several hours a day including walking  cT2 N0 borderline resectable pancreatic adenocarcinoma 12 cycles neoadjuvant FOLFIRINOX 10/31/2022-->5/8/025-is on Creon --started neoadjuvant XRT and Gemzar  08/27/2023 Follows Dr. Luke at [facility] for his pancreatic cancer-locally follows with our own dR.  Ennever--- he is not a candidate for Whipple procedure and has been referred back to Dr. Timmy for this purpose for medical management and XRT Status post 3 right lower lobe nodules resected with VATS wedge resection 08/03/2023 by Dr. 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 in the setting of recent chemoinfusion 9/9 Also recent tooth infection GI consulted planning for ERCP started on antibiotics for cholangitis risk     Assessment  & Plan :   Likely CBD obstruction requiring stent replacement-risk for cholangitis Zosyn , fluids 100 cc/H-Compazine  10 mg every 6 as needed refractory nausea Pain control Oxy IR liquid, morphine  2 mg Q2 as  needed severe Take specialized dosing of Protonix  40 3 times daily?-Insistent on taking this so continue for now ERCP?  Later today hopefully Metastatic pancreatic cancer status post 1 cycle of chemo above Currently neoadjuvant therapy which is on hold for now         Requires discussion with oncology-Will CC Dr. Timmy Will require outpatient goals of care discussions with Dr. Simeon confirms DNR but would want treatment Recent VATS for lung masse Has metastatic disease as above-outpatient follow-up at [facility] CVTS Recent tooth infection      Stop Amoxil  as will be on Zosyn -likely will need to see his dentist as an outpatient Diabetes mellitus         insulins adjusted-currently on Lantus  16 daily, 2 units 3 times daily meals and sliding scale coverage-CBG ranging 128-180 Depression Continue Zyprexa  5 daily   Data Reviewed:   Sodium 135 potassium 4.1 BUN/creatinine 12/1.0 Alk phos 196  AST/ALT 248/313 Bilirubin 3.6 WBC up to 12.2  DVT prophylaxis: SCD  Status is: Inpatient Remains inpatient appropriate because:   Needs definitive ERCP    Current Dispo: Inpatient     Subjective:    Dysgeusia persists some discomfort in abdomen but ambulatory declined Lovenox  no chest pain No lower extremity edema Low-grade temperatures 100.1 Waiting on ERCP   Objective + exam Vitals:   09/15/23 1345 09/15/23 1557 09/15/23 2200 09/16/23 1303  BP: 131/81 137/74 133/74 (!) 141/76  Pulse: 72 73 68 70  Resp: 18 18 16 16   Temp:  97.8 F (36.6 C) 98.2 F (36.8 C) 100.1 F (37.8 C)  TempSrc:  Oral Oral Oral  SpO2: 94% 98% 100% 98%  Weight:  Height:       Filed Weights   09/15/23 0746  Weight: 70.3 kg     Examination: Pleasant coherent no distress EOMI NCAT mildly icteric Neck soft supple Mild ABD tenderness in epigastrium on deep palpation ROM intact Power 5/5   Scheduled Meds:  amLODipine   10 mg Oral Daily   Chlorhexidine  Gluconate Cloth  6 each Topical Daily    insulin  aspart  0-6 Units Subcutaneous TID WC   insulin  aspart  2 Units Subcutaneous TID WC   insulin  glargine  16 Units Subcutaneous Daily   lipase/protease/amylase  72,000 Units Oral TID AC   pantoprazole   40 mg Oral TID   sodium chloride  flush  10-40 mL Intracatheter Q12H   Continuous Infusions:  sodium chloride  100 mL/hr at 09/16/23 0342   piperacillin -tazobactam (ZOSYN )  IV 3.375 g (09/16/23 0519)    Time 30  Jai-Gurmukh Negar Sieler, MD  [facility]

## 2023-09-16 NOTE — Plan of Care (Signed)

## 2023-09-16 NOTE — Progress Notes (Addendum)
 Pt request to contact close friend when procedure is done for update Octaviano [phone] or Darice [phone]

## 2023-09-16 NOTE — H&P (View-Only) (Signed)
 [facility] ROUNDING NOTE   Subjective: C/o worsening abd pain, fever, nausea and no appetite   Objective: Vital signs in last 24 hours: Temp:  [97.8 F (36.6 C)-100.1 F (37.8 C)] 100.1 F (37.8 C) (09/14 1303) Pulse Rate:  [68-73] 70 (09/14 1303) Resp:  [16-18] 16 (09/14 1303) BP: (133-141)/(74-76) 141/76 (09/14 1303) SpO2:  [98 %-100 %] 98 % (09/14 1303) Last BM Date : 09/15/23 General: NAD Lungs: b/l clear Heart: s1s2  Abdomen: soft, epigastric and upper abdomen mild tenderness, no distension Ext: No edema    Intake/Output from previous day: 09/13 0701 - 09/14 0700 In: 1604.3 [I.V.:1481; IV Piggyback:123.3] Out: -  Intake/Output this shift: No intake/output data recorded.   Lab Results: Recent Labs    09/15/23 0322 09/15/23 0345 09/16/23 0135  WBC  --  7.3 12.2*  HGB 13.6 13.1 11.2*  PLT  --  195 162  MCV  --  95.2 92.7   BMET Recent Labs    09/15/23 0322 09/15/23 0345 09/16/23 0135  NA 142 141 135  K 4.0 4.0 4.1  CL 103 104 100  CO2  --  23 24  GLUCOSE 156* 153* 128*  BUN 17 15 12   CREATININE 1.10 1.04 1.08  CALCIUM   --  9.4 8.7*   LFT Recent Labs    09/15/23 0550 09/16/23 0135  PROT 6.5 5.7*  ALBUMIN 3.4* 2.8*  AST 333* 248*  ALT 180* 313*  ALKPHOS 219* 196*  BILITOT 1.9* 3.6*  BILIDIR 1.0*  --   IBILI 0.9  --    PT/INR Recent Labs    09/16/23 0135  INR 1.4*      Imaging/Other results: CT ABDOMEN PELVIS W CONTRAST Result Date: 09/15/2023 CLINICAL DATA:  83 year old male with abdominal pain. Epigastric pain radiating to the back. Pancreatic cancer. * Tracking Code: BO * EXAM: CT ABDOMEN AND PELVIS WITH CONTRAST TECHNIQUE: Multidetector CT imaging of the abdomen and pelvis was performed using the standard protocol following bolus administration of intravenous contrast. RADIATION DOSE REDUCTION: This exam was performed according to the departmental dose-optimization program which includes automated exposure control,  adjustment of the mA and/or kV according to patient size and/or use of iterative reconstruction technique. CONTRAST:  75mL OMNIPAQUE  IOHEXOL  350 MG/ML SOLN COMPARISON:  CT Abdomen and Pelvis 10/04/2022.  CTA 10/13/2022. FINDINGS: Lower chest: Partially visible right chest Port-A-Cath at the right atrium, appears to be new from last year. Stable heart size. No pericardial effusion. No pleural effusion. Increased curvilinear enhancing atelectasis and scarring at the lung bases from last year. However, right lung nodules ranging from 7-9 mm on series 5, images 2 and 6 are new or progressed from last year. No other suspicious lung base nodule. Hepatobiliary: Metallic CBD stent now in place. Mild gallbladder distension similar to last year. Intrahepatic biliary ductal dilatation also not significantly changed from last year. Less pneumobilia. No discrete liver mass. Pancreas: Atrophied pancreatic body and tail with chronic pancreatic ductal dilatation which is regressed from last year. Diminutive head and uncinate. There is a greater degree of soft tissue along the distal CBD stent, and the soft tissue there appears somewhat spiculated on coronal image 55. When compared to 10/04/2022 this roughly 4 cm area is not obviously progressed. Spleen: Negative. Adrenals/Urinary Tract: Stable and negative: Stable and benign-appearing left renal midpole cyst (no follow up imaging recommended). Symmetric renal enhancement and contrast excretion. Decompressed ureters. Unremarkable bladder. Occasional pelvic phleboliths. Stomach/Bowel: Gas distended rectum. Upstream decompressed sigmoid colon with advanced diverticulosis. Advanced  diverticulosis in the descending colon. Occasional diverticula in the transverse colon. No active inflammation identified. Negative right colon. Normal gas containing appendix on series 3, image 57. Decompressed terminal ileum. Nondilated small bowel. Retained fluid and food, gas in the stomach. Duodenum  appears decompressed. No pneumoperitoneum, free fluid, or mesenteric inflammation identified. Vascular/Lymphatic: Aortoiliac calcified atherosclerosis. Major arterial structures remain patent. Stable mild tortuosity of the abdominal aorta. Portal venous system is patent. No lymphadenopathy identified. Reproductive: Chronic fat containing right inguinal hernia appears stable. Other: No pelvis free fluid. Musculoskeletal: Chronic spine degeneration, hyperostosis. No acute or suspicious osseous lesion identified. IMPRESSION: 1. Sequelae of Obstructing Pancreatic Tumor with metal CBD stent now in place. Intra- and extrahepatic biliary ductal dilatation not significantly changed from last year. Main pancreatic ductal dilatation has regressed. Spiculated pancreatic soft tissue along the distal CBD stent not obviously progressed since CTA on 10/04/2022. 2. However, two right lung base nodules ranging from 7 mm to 9 mm are new or increased from last year and suspicious for Pulmonary Metastatic Disease. 3. No other acute finding in the abdomen or pelvis. 4.  Aortic Atherosclerosis (ICD10-I70.0). Electronically Signed   By: VEAR Hurst M.D.   On: 09/15/2023 06:31   DG Chest 2 View Result Date: 09/15/2023 CLINICAL DATA:  Chest pain EXAM: CHEST - 2 VIEW COMPARISON:  10/13/2022 FINDINGS: Cardiac shadows within normal limits. Right chest wall port is noted in satisfactory position. Lungs are clear. Postsurgical changes in the right base are noted. No acute bony abnormality is noted. IMPRESSION: No acute abnormality noted. Electronically Signed   By: Oneil Devonshire M.D.   On: 09/15/2023 04:06      Assessment &Plan  83 year old very pleasant gentleman with pancreatic adenocarcinoma initially diagnosed in September 2024 status post chemoradiation, was restarted on chemoradiation few weeks ago CT abdomen pelvis with increased soft tissue along the distal common bile duct, appears spiculated and also has increase in right lobe  lung nodule size  His presentation is concerning for stent occlusion with possible progression of disease or biliary sludge  He is febrile and has worsening leukocytosis concerning for ascending cholangitis Worsening LFT Switched to IV Zosyn  from IV Unasyn  yesterday  Plan for urgent ERCP on Monday with Dr. Wilhelmenia N.p.o. after midnight Continue IV antibiotics Pain management and continue IV fluids   This visit required >35 minutes of patient care (this includes precharting, chart review, review of results, face-to-face time used for counseling as well as treatment plan and follow-up. The patient was provided an opportunity to ask questions and all were answered. The patient agreed with the plan and demonstrated an understanding of the instructions.   LOIS Wilkie Mcgee , MD [phone]  [facility]  Addendum Discussed his case with Dr. Charlanne, he is on-call tonight and also with Dr. Wilhelmenia, he is ERCP backup starting tomorrow. Patient made n.p.o. this afternoon given change in his clinical course, became febrile and is having chills Continue to monitor closely, please stat page GI Oncall with any change in symptoms

## 2023-09-17 ENCOUNTER — Ambulatory Visit

## 2023-09-17 ENCOUNTER — Encounter (HOSPITAL_COMMUNITY)
Admission: EM | Disposition: A | Discharge status: Home / Self Care | Payer: Self-pay | Source: Home / Self Care | Attending: Family Medicine

## 2023-09-17 ENCOUNTER — Inpatient Hospital Stay (HOSPITAL_COMMUNITY)

## 2023-09-17 ENCOUNTER — Inpatient Hospital Stay (HOSPITAL_COMMUNITY): Admitting: Certified Registered"

## 2023-09-17 ENCOUNTER — Encounter (HOSPITAL_COMMUNITY): Payer: Self-pay | Admitting: Family Medicine

## 2023-09-17 DIAGNOSIS — K8051 Calculus of bile duct without cholangitis or cholecystitis with obstruction: Secondary | ICD-10-CM

## 2023-09-17 DIAGNOSIS — R1013 Epigastric pain: Principal | ICD-10-CM

## 2023-09-17 DIAGNOSIS — I10 Essential (primary) hypertension: Secondary | ICD-10-CM | POA: Diagnosis not present

## 2023-09-17 DIAGNOSIS — I25110 Atherosclerotic heart disease of native coronary artery with unstable angina pectoris: Secondary | ICD-10-CM | POA: Diagnosis not present

## 2023-09-17 DIAGNOSIS — C259 Malignant neoplasm of pancreas, unspecified: Secondary | ICD-10-CM | POA: Diagnosis not present

## 2023-09-17 DIAGNOSIS — T85590A Other mechanical complication of bile duct prosthesis, initial encounter: Principal | ICD-10-CM

## 2023-09-17 DIAGNOSIS — E119 Type 2 diabetes mellitus without complications: Secondary | ICD-10-CM | POA: Diagnosis not present

## 2023-09-17 DIAGNOSIS — K838 Other specified diseases of biliary tract: Secondary | ICD-10-CM

## 2023-09-17 DIAGNOSIS — R7989 Other specified abnormal findings of blood chemistry: Secondary | ICD-10-CM

## 2023-09-17 DIAGNOSIS — K8309 Other cholangitis: Secondary | ICD-10-CM

## 2023-09-17 DIAGNOSIS — K8050 Calculus of bile duct without cholangitis or cholecystitis without obstruction: Secondary | ICD-10-CM | POA: Diagnosis not present

## 2023-09-17 HISTORY — PX: ERCP: SHX5425

## 2023-09-17 LAB — COMPREHENSIVE METABOLIC PANEL WITH GFR
ALT: 184 U/L — ABNORMAL HIGH (ref 0–44)
AST: 81 U/L — ABNORMAL HIGH (ref 15–41)
Albumin: 2.4 g/dL — ABNORMAL LOW (ref 3.5–5.0)
Alkaline Phosphatase: 203 U/L — ABNORMAL HIGH (ref 38–126)
Anion gap: 9 (ref 5–15)
BUN: 10 mg/dL (ref 8–23)
CO2: 25 mmol/L (ref 22–32)
Calcium: 8.3 mg/dL — ABNORMAL LOW (ref 8.9–10.3)
Chloride: 100 mmol/L (ref 98–111)
Creatinine, Ser: 1.04 mg/dL (ref 0.61–1.24)
GFR, Estimated: >60 mL/min (ref >60)
Glucose, Bld: 135 mg/dL — ABNORMAL HIGH (ref 70–99)
Potassium: 3.4 mmol/L — ABNORMAL LOW (ref 3.5–5.1)
Sodium: 134 mmol/L — ABNORMAL LOW (ref 135–145)
Total Bilirubin: 4.8 mg/dL — ABNORMAL HIGH (ref 0.0–1.2)
Total Protein: 5.6 g/dL — ABNORMAL LOW (ref 6.5–8.1)

## 2023-09-17 LAB — CBC WITH DIFFERENTIAL/PLATELET
Basophils Absolute: 0 K/uL (ref 0.0–0.1)
Basophils Relative: 0 %
Eosinophils Absolute: 0 K/uL (ref 0.0–0.5)
Eosinophils Relative: 0 %
HCT: 33.4 % — ABNORMAL LOW (ref 39.0–52.0)
Hemoglobin: 11.2 g/dL — ABNORMAL LOW (ref 13.0–17.0)
Lymphocytes Relative: 4 %
Lymphs Abs: 0.4 K/uL — ABNORMAL LOW (ref 0.7–4.0)
MCH: 31.6 pg (ref 26.0–34.0)
MCHC: 33.5 g/dL (ref 30.0–36.0)
MCV: 94.4 fL (ref 80.0–100.0)
Monocytes Absolute: 0.7 K/uL (ref 0.1–1.0)
Monocytes Relative: 7 %
Neutro Abs: 8.9 K/uL — ABNORMAL HIGH (ref 1.7–7.7)
Neutrophils Relative %: 89 %
Platelets: 166 K/uL (ref 150–400)
RBC: 3.54 MIL/uL — ABNORMAL LOW (ref 4.22–5.81)
RDW: 14.6 % (ref 11.5–15.5)
WBC: 10 K/uL (ref 4.0–10.5)
nRBC: 0 % (ref 0.0–0.2)

## 2023-09-17 LAB — GLUCOSE, CAPILLARY: Glucose-Capillary: 120 mg/dL — ABNORMAL HIGH (ref 70–99)

## 2023-09-17 SURGERY — ERCP, WITH INTERVENTION IF INDICATED
Anesthesia: General

## 2023-09-17 MED ORDER — DICLOFENAC SUPPOSITORY 100 MG
RECTAL | Status: DC | PRN
Start: 2023-09-17 — End: 2023-09-17
  Administered 2023-09-17: 100 mg via RECTAL

## 2023-09-17 MED ORDER — FENTANYL CITRATE (PF) 100 MCG/2ML IJ SOLN
INTRAMUSCULAR | Status: AC
  Filled 2023-09-17: qty 2

## 2023-09-17 MED ORDER — SUGAMMADEX SODIUM 200 MG/2ML IV SOLN
INTRAVENOUS | Status: DC | PRN
  Administered 2023-09-17: 200 mg via INTRAVENOUS

## 2023-09-17 MED ORDER — DICLOFENAC SUPPOSITORY 100 MG
RECTAL | Status: AC
  Filled 2023-09-17: qty 1

## 2023-09-17 MED ORDER — SODIUM CHLORIDE 0.9 % IV SOLN
INTRAVENOUS | Status: DC | PRN
  Administered 2023-09-17: 40 mL

## 2023-09-17 MED ORDER — CIPROFLOXACIN IN D5W 400 MG/200ML IV SOLN
INTRAVENOUS | Status: AC
  Filled 2023-09-17: qty 200

## 2023-09-17 MED ORDER — LACTATED RINGERS IV SOLN: INTRAVENOUS | Status: DC | PRN

## 2023-09-17 MED ORDER — GLUCAGON HCL RDNA (DIAGNOSTIC) 1 MG IJ SOLR
INTRAMUSCULAR | Status: AC
  Filled 2023-09-17: qty 1

## 2023-09-17 MED ORDER — LIDOCAINE 2% (20 MG/ML) 5 ML SYRINGE
INTRAMUSCULAR | Status: DC | PRN
  Administered 2023-09-17: 60 mg via INTRAVENOUS

## 2023-09-17 MED ORDER — ROCURONIUM BROMIDE 10 MG/ML (PF) SYRINGE
PREFILLED_SYRINGE | INTRAVENOUS | Status: DC | PRN
  Administered 2023-09-17: 40 mg via INTRAVENOUS

## 2023-09-17 MED ORDER — INSULIN ASPART 100 UNIT/ML IJ SOLN
2.0000 [IU] | Freq: Three times a day (TID) | INTRAMUSCULAR | Status: DC
Start: 2023-09-17 — End: 2023-09-18
  Administered 2023-09-17 – 2023-09-18 (×2): 2 [IU] via SUBCUTANEOUS

## 2023-09-17 MED ORDER — PROPOFOL 10 MG/ML IV BOLUS
INTRAVENOUS | Status: DC | PRN
  Administered 2023-09-17: 130 mg via INTRAVENOUS

## 2023-09-17 MED ORDER — PHENYLEPHRINE 80 MCG/ML (10ML) SYRINGE FOR IV PUSH (FOR BLOOD PRESSURE SUPPORT)
PREFILLED_SYRINGE | INTRAVENOUS | Status: DC | PRN
  Administered 2023-09-17 (×3): 80 ug via INTRAVENOUS

## 2023-09-17 MED ORDER — FENTANYL CITRATE (PF) 250 MCG/5ML IJ SOLN
INTRAMUSCULAR | Status: DC | PRN
  Administered 2023-09-17 (×2): 50 ug via INTRAVENOUS

## 2023-09-17 MED ORDER — INSULIN GLARGINE 100 UNIT/ML ~~LOC~~ SOLN
16.0000 [IU] | Freq: Every day | SUBCUTANEOUS | Status: DC
  Administered 2023-09-18: 16 [IU] via SUBCUTANEOUS
  Filled 2023-09-17: qty 0.16

## 2023-09-17 MED ORDER — ONDANSETRON HCL 4 MG/2ML IJ SOLN
INTRAMUSCULAR | Status: DC | PRN
  Administered 2023-09-17: 4 mg via INTRAVENOUS

## 2023-09-17 MED ORDER — DEXAMETHASONE SODIUM PHOSPHATE 10 MG/ML IJ SOLN
INTRAMUSCULAR | Status: DC | PRN
  Administered 2023-09-17: 10 mg via INTRAVENOUS

## 2023-09-17 MED ORDER — INSULIN ASPART 100 UNIT/ML IJ SOLN
0.0000 [IU] | Freq: Three times a day (TID) | INTRAMUSCULAR | Status: DC
  Administered 2023-09-17: 3 [IU] via SUBCUTANEOUS
  Administered 2023-09-18: 2 [IU] via SUBCUTANEOUS
  Administered 2023-09-18: 1 [IU] via SUBCUTANEOUS

## 2023-09-17 MED ORDER — PHENYLEPHRINE HCL-NACL 20-0.9 MG/250ML-% IV SOLN
INTRAVENOUS | Status: DC | PRN
  Administered 2023-09-17: 50 ug/min via INTRAVENOUS

## 2023-09-17 NOTE — Progress Notes (Signed)
 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 Luke at [facility] for his pancreatic cancer-locally follows with our own dR.  Ennever--- he is not a candidate for Whipple procedure and has been referred back to Dr. Timmy for this purpose for medical management and XRT Status post 3 right lower lobe nodules resected with VATS wedge resection 08/03/2023 by Dr. 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 in the setting of recent chemoinfusion 9/9 Also recent tooth infection GI consulted 9/15 ERCP sphincterotomy open metal stent in biliary tree from major papula completely occluded-pus emerging-0.035 inch X260 sonometer straight Hydra Jagwire passed into the biliary tree-sphincterotome passed over guidewire deeply cannulated--evidence of distal biliary stenosis in the ampulla  due to tissue ingrowth even after cleanout-1 covered metal stent placed in CBD to transverse this area and allow full expansion-very small chance of cholecystitis     Assessment  & Plan :   CBD obstruction status post ERCP as above frank cholangitis confirmed-tissue growth occluded by biliary stent currently Antibiotics fluids and further planning for GI--lipase in a.m. with Chem-12 INR Take specialized dosing of Protonix  40 3 times daily?-Insistent on taking this so continue for now Possibility cholecystitis GI to comment when next seen Metastatic pancreatic cancer status post 1 cycle of chemo above Currently neoadjuvant therapy which is on hold for now         Will require outpatient goals of care discussions with Dr. Simeon confirms DNR but would want treatment May need to consolidate/alter chemo/Gemzar  and discuss @ next office visit with Oncology Recent VATS for lung masse Has metastatic disease as above-outpatient follow-up at [facility] CVTS Recent tooth infection      Stopped Amoxil  ---on Zosyn -follow-up dentist as an outpatient Diabetes mellitus         insulins adjusted-currently on Lantus  16 daily, 2 units 3 times daily meals and sliding scale coverage-CBG controlled Depression Continue Zyprexa  5 daily   Data Reviewed:   Sodium 134 potassium 3.4 BUN/creatinine 10/1.0 alk phos 203 AST/ALT 81/184 (lower than prior) Bilirubin 4.8 WBC 10 hemoglobin 11 platelet 166  DVT prophylaxis: SCD  Status is: Inpatient Remains inpatient appropriate because:   Needs definitive ERCP    Current Dispo: Inpatient     Subjective:    Seen earlier prior tp procedure in Endo Now seen in room with wife Santina over results--he feels great--drinking eating some clears No pain fever Note Tmax 100.5 earlier    Objective + exam Vitals:  09/17/23 1542 09/17/23 1550 09/17/23 1600 09/17/23 1617  BP:  124/76 132/73 136/80  Pulse: 82 85 75 68  Resp: (!) 22 12 12 18   Temp:    98.2 F  (36.8 C)  TempSrc:    Oral  SpO2: 98% 94% 94% 97%  Weight:      Height:       Filed Weights   09/15/23 0746  Weight: 70.3 kg     Examination:  Awake coherent  Partial dentures mild ict No pallor S1 s 2no m Abd slight distension--no rebound No le edema  Scheduled Meds:  amLODipine   10 mg Oral Daily   Chlorhexidine  Gluconate Cloth  6 each Topical Daily   diclofenac   100 mg Rectal Once   insulin  aspart  0-6 Units Subcutaneous TID WC   insulin  aspart  2 Units Subcutaneous TID WC   insulin  glargine  16 Units Subcutaneous Daily   lipase/protease/amylase  72,000 Units Oral TID AC   pantoprazole   40 mg Oral TID   sodium chloride  flush  10-40 mL Intracatheter Q12H   Continuous Infusions:  sodium chloride  Stopped (09/17/23 1604)   chlorproMAZINE  (THORAZINE ) 12.5 mg in sodium chloride  0.9 % 25 mL IVPB 12.5 mg (09/16/23 1837)   piperacillin -tazobactam (ZOSYN )  IV 3.375 g (09/17/23 0606)    Time 30  Jai-Gurmukh Telly Jawad, MD  [facility]

## 2023-09-17 NOTE — Anesthesia Preprocedure Evaluation (Addendum)
 Anesthesia Evaluation  Patient identified by MRN, date of birth, ID band Patient awake    Reviewed: Allergy & Precautions, NPO status , Patient's Chart, lab work & pertinent test results  Airway Mallampati: I  TM Distance: >3 FB Neck ROM: Full    Dental no notable dental hx. (+) Teeth Intact, Dental Advisory Given   Pulmonary neg pulmonary ROS   Pulmonary exam normal breath sounds clear to auscultation       Cardiovascular hypertension, Pt. on medications + angina  + CAD  Normal cardiovascular exam Rhythm:Regular Rate:Normal     Neuro/Psych negative neurological ROS  negative psych ROS   GI/Hepatic Neg liver ROS,GERD  ,,  Endo/Other  diabetes, Type 2, Insulin  Dependent    Renal/GU negative Renal ROS  negative genitourinary   Musculoskeletal  (+) Arthritis ,    Abdominal   Peds  Hematology negative hematology ROS (+)   Anesthesia Other Findings   Reproductive/Obstetrics                              Anesthesia Physical Anesthesia Plan  ASA: 3  Anesthesia Plan: General   Post-op Pain Management: Minimal or no pain anticipated   Induction: Intravenous  PONV Risk Score and Plan: 2 and Ondansetron , TIVA and Treatment may vary due to age or medical condition  Airway Management Planned: Oral ETT  Additional Equipment:   Intra-op Plan:   Post-operative Plan: Extubation in OR  Informed Consent: I have reviewed the patients History and Physical, chart, labs and discussed the procedure including the risks, benefits and alternatives for the proposed anesthesia with the patient or authorized representative who has indicated his/her understanding and acceptance.   Patient has DNR.  Discussed DNR with patient and Suspend DNR.   Dental advisory given  Plan Discussed with: CRNA  Anesthesia Plan Comments:          Anesthesia Quick Evaluation

## 2023-09-17 NOTE — Progress Notes (Signed)
 Mobility Specialist Progress Note:   09/17/23 0956  Mobility  Activity Ambulated with assistance (In hallway)  Level of Assistance Standby assist, set-up cues, supervision of patient - no hands on  Assistive Device Other (Comment) (IV Pole)  Distance Ambulated (ft) 225 ft  Activity Response Tolerated well  Mobility Referral Yes  Mobility visit 1 Mobility  Mobility Specialist Start Time (ACUTE ONLY) 0846  Mobility Specialist Stop Time (ACUTE ONLY) 0857  Mobility Specialist Time Calculation (min) (ACUTE ONLY) 11 min   Received pt in bed having complaints of abdominal pain and agreeable to mobility. Pt was asymptomatic throughout ambulation and returned to room w/o fault. Left in bed w/ call bell in reach and all needs met.   Lavanda Pollack Mobility Specialist  Please contact via [employer] or  Rehab Office [phone]

## 2023-09-17 NOTE — Plan of Care (Signed)
  Pt has been NPO since midnight. CHG bath given. Consent signed. Pt will urinate and brush teeth prior to going to OR  Problem: Education: Goal: Knowledge of General Education information will improve Description: Including pain rating scale, medication(s)/side effects and non-pharmacologic comfort measures Outcome: Progressing   Problem: Health Behavior/Discharge Planning: Goal: Ability to manage health-related needs will improve Outcome: Progressing   Problem: Clinical Measurements: Goal: Ability to maintain clinical measurements within normal limits will improve Outcome: Progressing Goal: Will remain free from infection Outcome: Progressing Goal: Diagnostic test results will improve Outcome: Progressing Goal: Respiratory complications will improve Outcome: Progressing Goal: Cardiovascular complication will be avoided Outcome: Progressing   Problem: Nutrition: Goal: Adequate nutrition will be maintained Outcome: Progressing   Problem: Coping: Goal: Level of anxiety will decrease Outcome: Progressing   Problem: Elimination: Goal: Will not experience complications related to bowel motility Outcome: Progressing Goal: Will not experience complications related to urinary retention Outcome: Progressing

## 2023-09-17 NOTE — Interval H&P Note (Signed)
 History and Physical Interval Note:  09/17/2023 2:17 PM  Alexander [name]  has presented today for surgery, with the diagnosis of Pancreatic cancer, biliary stent occlusion.  The various methods of treatment have been discussed with the patient and family. After consideration of risks, benefits and other options for treatment, the patient has consented to  Procedure(s): ERCP, WITH INTERVENTION IF INDICATED (N/A) as a surgical intervention.  The patient's history has been reviewed, patient examined, no change in status, stable for surgery.  I have reviewed the patient's chart and labs.  Questions were answered to the patient's satisfaction.    The risks of an ERCP were discussed at length, including but not limited to the risk of perforation, bleeding, abdominal pain, post-ERCP pancreatitis (while usually mild can be severe and even life threatening).    [name]

## 2023-09-17 NOTE — TOC CM/SW Note (Signed)
 Transition of Care [facility]) - Inpatient Brief Assessment   Patient Details  Name: Alexander [name] MRN: [id number] Date of Birth: [birth year]  [facility]) CM/SW Contact:    Lauraine FORBES Saa, LCSWA Phone Number: 09/17/2023, 8:55 AM   Clinical Narrative:  8:55 AM Per chart review, patient resides at home with spouse. Patient has a PCP and insurance. Patient does not have SNF/HH/DME history. Patient's preferred pharmacy is [facility] [phone] [place]. No TOC needs identified at this time. TOC will continue to follow and be available to assist.  Transition of Care Asessment: Insurance and Status: Insurance coverage has been reviewed Patient has primary care physician: Yes Home environment has been reviewed: Private Residence Prior level of function:: N/A Prior/Current Home Services: No current home services Social Drivers of Health Review: SDOH reviewed no interventions necessary Readmission risk has been reviewed: Yes (Currently Green 13%) Transition of care needs: no transition of care needs at this time

## 2023-09-17 NOTE — Consult Note (Signed)
 Alexander [name] is well-known to me.  He is a very nice 83 year old white male.  He actually had localized pancreatic cancer.  He underwent neoadjuvant chemotherapy.  Unfortunately, he began to have progressive disease.  She had lung nodules.  He subsequently was taken off the surgical list.  He has been getting radiotherapy to the pancreas primary.  He also has had chemotherapy with gemcitabine .  He had COVID presented with biliary obstruction.  This was back in 2024.  He has stent placed.  Now, it sounds like he may have a stent that could be infected.  He began to have severe abdominal pain 4 days ago.  He really cannot eat.  He had no vomiting.  He had no diarrhea.  He had no problems urinating.  He came to the ER.  His labs on the 09/15/2023 showed a bilirubin of 1.  SGPT 180 SGOT 333.  Alkaline phosphatase 219.  BUN 15 creatinine 1.04.  Today, his bilirubin is up to 4.8.  His white cell count was 7.3.  Hemoglobin 13.1.  Platelet count 195.  Today, white cell count is 10.  Hemoglobin 9.2.  Platelet count 166.  He had a CT scan that was done on 09/15/2023.  This was relatively unrevealing.  There is no obstruction.  He has been seen by Gastroenterology.  They will do a ERCP on him today.  He says the pain is better.  He has been given IV antibiotics.  He has been not eating much.  He is having no diarrhea.  There is been no melena or bright red blood per rectum.  He has had no urinary issues.  As always, he is quite motivated.  He really does seem to be in decent shape.   Vital signs show temperature of 98.6.  Pulse 85.  Blood pressure 162/80.  Head neck exam shows no ocular or oral lesions.  There is some scleral icterus.  He has no adenopathy.  Lungs are clear bilaterally.  Cardiac exam regular rate and rhythm.  Has no murmurs.  Abdomen is soft.  There is maybe some slight distention.  There is no fluid wave.  There is no guarding or rebound tenderness.  I cannot palpate any mass in the  right upper quadrant.  Extremity shows no clubbing, cyanosis or edema.  Neurological exam is nonfocal.  I suspect that Alexander [name] probably does have biliary obstruction.  His bilirubin is starting to go up.  He can certainly have an infected stent.  Again, he will have your CP today.  Hopefully the stent will be removed and sent for culture.  Hopefully, everything will improve with stent exchange.  We will continue to follow along.  I know that he will get great care from everybody up on 5C.   Jeralyn Crease, MD  [place] 1:5

## 2023-09-17 NOTE — Op Note (Signed)
 [facility] Patient Name: Alexander [name] Procedure Date : 09/17/2023 MRN: [id number] Attending MD: Aloha Finner , MD, [id number] Date of Birth: Feb 26, 1940 CSN: [id number] Age: 83 Admit Type: Inpatient Procedure:                ERCP Indications:              Biliary dilation on Computed Tomogram Scan,                            Suspected ascending cholangitis, For therapy of                            ascending cholangitis, Jaundice, Elevated liver                            enzymes Providers:                Aloha Finner, MD, Hoy Penner, RN, Felice Sar, Technician Referring MD:              Medicines:                General Anesthesia, Zosyn  3.375 g IV (already                            scheduled), Diclofenac  100 mg rectal Complications:            No immediate complications. Estimated Blood Loss:     Estimated blood loss was minimal. Procedure:                Pre-Anesthesia Assessment:                           - Prior to the procedure, a History and Physical                            was performed, and patient medications and                            allergies were reviewed. The patient's tolerance of                            previous anesthesia was also reviewed. The risks                            and benefits of the procedure and the sedation                            options and risks were discussed with the patient.                            All questions were answered, and informed consent                            was obtained. Prior Anticoagulants:  The patient has                            taken no anticoagulant or antiplatelet agents. ASA                            Grade Assessment: III - A patient with severe                            systemic disease. After reviewing the risks and                            benefits, the patient was deemed in satisfactory                            condition to  undergo the procedure.                           After obtaining informed consent, the scope was                            passed under direct vision. Throughout the                            procedure, the patient's blood pressure, pulse, and                            oxygen saturations were monitored continuously. The                            [employer] D single use                            duodenoscope was introduced through the mouth, and                            used to inject contrast into and used to inject                            contrast into the bile duct. The ERCP was                            accomplished without difficulty. The patient                            tolerated the procedure. Scope In: Scope Out: Findings:      A biliary stent was visible on the scout film.      The esophagus was successfully intubated under direct vision without       detailed examination of the pharynx, larynx, and associated structures,       and upper GI tract. A biliary sphincterotomy had been performed. The       sphincterotomy appeared open. One metal stent originating in the biliary       tree was emerging from the major  papilla. The stent was totally       occluded. Pus was emerging from the major papilla.      A 0.035 inch x 260 cm straight Hydra Jagwire was passed into the biliary       tree. The Hydratome sphincterotome was passed over the guidewire and the       bile duct was then deeply cannulated (this allowed more pus to be noted       just with passage of the sphincterotome). Contrast was injected,       initially with very small aliquots to prevent progressive cholangitis. I       personally interpreted the bile duct images. Ductal flow of contrast was       adequate. Image quality was adequate. Contrast extended to the hepatic       ducts. Opacification of the entire biliary tree except for the       gallbladder was successful. The lower third of  the main bile duct and       middle third of the main bile duct contained one metal stent. The stent       was visibly occluded. The lower third of the main bile duct and middle       third of the main bile duct contained filling defects thought to be       sludge versus sludge balls/stones. The lower third of the main bile duct       (right at the transition of the distal bile duct to the ampullary       orifice (previously placed uncovered portion of stent) showed evidence       of containing a single moderate stenosis 10 mm in length (and this       remained on fluoroscopic imaging). To discover objects, the biliary tree       was swept with a retrieval balloon. Sludge was swept from the duct. Many       stones were removed. No stones remained. Pus was swept from the duct. An       occlusion cholangiogram was performed that showed the distal biliary       narrowing/stenosis and no further significant biliary pathology. To help       prevent further tissue ingrowth and what was previously the uncovered       portion of the partially covered stent, decision was made to place a       fully covered stent distally. One 10 mm by 4 cm covered metal biliary       stent was placed into the common bile duct. The stent was in good       position.      A formal pancreatogram was not performed.      The duodenoscope was withdrawn from the patient. Impression:               - Prior biliary sphincterotomy appeared open.                           - One totally occluded stent from the biliary tree                            was seen in the major papilla. Pus was seen in the  major papilla.                           - The fluoroscopic examination was suspicious for                            sludge.                           - A single moderate biliary stricture was found in                            the lower third of the main bile duct.                           -  Choledocholithiasis/significant biliary                            sludge/debris as well as cholangitis/pus was found.                            Complete removal was accomplished by sweeping the                            stent.                           - This showed evidence of a distal biliary stenosis                            at the area of the ampulla as a result of tissue                            ingrowth which remained present even after                            cleanout. One covered metal biliary stent was                            placed into the common bile duct to traverse this                            area and allow full expansion distally to hopefully                            prevent further tissue ingrowth in future. Recommendation:           - The patient will be observed post-procedure,                            until all discharge criteria are met.                           - Return patient to hospital ward for ongoing care.                           -  Observe patient's clinical course.                           - Treat patient with antibiotics for total of 7                            days (longer if bacteremia is found).                           - Trend LFTs.                           - Certainly with the bile duct being patent at this                            point in time, hope would be that patient continues                            to have clinical improvement. There is a small                            chance that patient (see imaging with gallbladder                            distention previously noted), could have an                            underlying cholecystitis as well. I suspect that as                            the bile duct is now decompressed that there should                            have less gallbladder distention hopefully. If                            patient does not have clinical improvement, query                             possible role of therapy to the gallbladder                            (percutaneous cholecystostomy consideration) versus                            cholecystectomy.                           - Hopefully the fully covered stent will allow the                            distal bile duct/ampullary area to remain  open/patent so that recurring cholangitis does not                            occur. This stent can migrate however and should be                            kept in mind if patient returns with recurring                            obstruction.                           - The findings and recommendations were discussed                            with the patient.                           - The findings and recommendations were discussed                            with the referring physician. Procedure Code(s):        --- Professional ---                           [phone], Endoscopic retrograde                            cholangiopancreatography (ERCP); with removal and                            exchange of stent(s), biliary or pancreatic duct,                            including pre- and post-dilation and guide wire                            passage, when performed, including sphincterotomy,                            when performed, each stent exchanged                           [id number], Endoscopic retrograde                            cholangiopancreatography (ERCP); with removal of                            calculi/debris from biliary/pancreatic duct(s)                           74328, 26, Endoscopic catheterization of the                            biliary ductal system, radiological supervision and  interpretation Diagnosis Code(s):        --- Professional ---                           K80.31, Calculus of bile duct with cholangitis,                            unspecified, with obstruction                            T85.590A, Other mechanical complication of bile                            duct prosthesis, initial encounter                           K83.8, Other specified diseases of biliary tract                           K83.09, Other cholangitis                           R17, Unspecified jaundice                           R74.8, Abnormal levels of other serum enzymes CPT copyright 2022 American Medical Association. All rights reserved. The codes documented in this report are preliminary and upon coder review may  be revised to meet current compliance requirements. Aloha Finner, MD 09/17/2023 3:41:11 PM Number of Addenda: 0

## 2023-09-17 NOTE — Anesthesia Procedure Notes (Signed)
 Procedure Name: Intubation Date/Time: 09/17/2023 2:30 PM  Performed by: Delores Duwaine SAUNDERS, CRNAPre-anesthesia Checklist: Patient identified, Emergency Drugs available, Suction available, Patient being monitored and Timeout performed Patient Re-evaluated:Patient Re-evaluated prior to induction Oxygen Delivery Method: Circle system utilized Preoxygenation: Pre-oxygenation with 100% oxygen Induction Type: IV induction Ventilation: Two handed mask ventilation required and Oral airway inserted - appropriate to patient size Laryngoscope Size: Mac and 4 Grade View: Grade II Tube size: 7.0 mm Number of attempts: 1 Airway Equipment and Method: Patient positioned with wedge pillow and Stylet Placement Confirmation: ETT inserted through vocal cords under direct vision, positive ETCO2, CO2 detector and breath sounds checked- equal and bilateral Secured at: 24 cm Tube secured with: Tape

## 2023-09-17 NOTE — Anesthesia Postprocedure Evaluation (Signed)
 Anesthesia Post Note  Patient: Alexander [name]  Procedure(s) Performed: ERCP, WITH INTERVENTION IF INDICATED     Patient location during evaluation: PACU Anesthesia Type: General Level of consciousness: awake and alert Pain management: pain level controlled Vital Signs Assessment: post-procedure vital signs reviewed and stable Respiratory status: spontaneous breathing, nonlabored ventilation and respiratory function stable Cardiovascular status: blood pressure returned to baseline and stable Postop Assessment: no apparent nausea or vomiting Anesthetic complications: no   No notable events documented.  Last Vitals:  Vitals:   09/17/23 1550 09/17/23 1600  BP: 124/76 132/73  Pulse: 85 75  Resp: 12 12  Temp:    SpO2: 94% 94%    Last Pain:  Vitals:   09/17/23 1600  TempSrc:   PainSc: 0-No pain                 Merilee Wible,W. EDMOND

## 2023-09-17 NOTE — Plan of Care (Signed)

## 2023-09-17 NOTE — Transfer of Care (Signed)
 Immediate Anesthesia Transfer of Care Note  Patient: Alexander [name]  Procedure(s) Performed: ERCP, WITH INTERVENTION IF INDICATED  Patient Location: Endoscopy Unit  Anesthesia Type:General  Level of Consciousness: drowsy and patient cooperative  Airway & Oxygen Therapy: Patient Spontanous Breathing and Patient connected to face mask oxygen  Post-op Assessment: Report given to RN, Post -op Vital signs reviewed and stable, Patient moving all extremities, and Patient moving all extremities X 4  Post vital signs: Reviewed and stable  Last Vitals:  Vitals Value Taken Time  BP 139/70 09/17/23 15:40  Temp 36.6 C 09/17/23 15:36  Pulse 82 09/17/23 15:43  Resp 11 09/17/23 15:43  SpO2 96 % 09/17/23 15:43  Vitals shown include unfiled device data.  Last Pain:  Vitals:   09/17/23 1542  TempSrc:   PainSc: 0-No pain         Complications: No notable events documented.

## 2023-09-17 NOTE — Progress Notes (Signed)
 Patient BS noted at 129

## 2023-09-18 ENCOUNTER — Ambulatory Visit

## 2023-09-18 ENCOUNTER — Inpatient Hospital Stay

## 2023-09-18 ENCOUNTER — Encounter (HOSPITAL_COMMUNITY): Payer: Self-pay | Admitting: Gastroenterology

## 2023-09-18 ENCOUNTER — Inpatient Hospital Stay: Admitting: Hematology & Oncology

## 2023-09-18 ENCOUNTER — Encounter: Payer: Self-pay | Admitting: Family

## 2023-09-18 ENCOUNTER — Encounter: Payer: Self-pay | Admitting: *Deleted

## 2023-09-18 DIAGNOSIS — K8050 Calculus of bile duct without cholangitis or cholecystitis without obstruction: Secondary | ICD-10-CM | POA: Diagnosis not present

## 2023-09-18 DIAGNOSIS — C259 Malignant neoplasm of pancreas, unspecified: Secondary | ICD-10-CM | POA: Diagnosis not present

## 2023-09-18 LAB — CBC WITH DIFFERENTIAL/PLATELET
Abs Immature Granulocytes: 0.05 K/uL (ref 0.00–0.07)
Basophils Absolute: 0 K/uL (ref 0.0–0.1)
Basophils Relative: 0 %
Eosinophils Absolute: 0 K/uL (ref 0.0–0.5)
Eosinophils Relative: 0 %
HCT: 33.1 % — ABNORMAL LOW (ref 39.0–52.0)
Hemoglobin: 11.2 g/dL — ABNORMAL LOW (ref 13.0–17.0)
Immature Granulocytes: 1 %
Lymphocytes Relative: 3 %
Lymphs Abs: 0.2 K/uL — ABNORMAL LOW (ref 0.7–4.0)
MCH: 31.5 pg (ref 26.0–34.0)
MCHC: 33.8 g/dL (ref 30.0–36.0)
MCV: 93 fL (ref 80.0–100.0)
Monocytes Absolute: 0.6 K/uL (ref 0.1–1.0)
Monocytes Relative: 10 %
Neutro Abs: 5.5 K/uL (ref 1.7–7.7)
Neutrophils Relative %: 86 %
Platelets: 160 K/uL (ref 150–400)
RBC: 3.56 MIL/uL — ABNORMAL LOW (ref 4.22–5.81)
RDW: 14.6 % (ref 11.5–15.5)
WBC: 6.4 K/uL (ref 4.0–10.5)
nRBC: 0 % (ref 0.0–0.2)

## 2023-09-18 LAB — COMPREHENSIVE METABOLIC PANEL WITH GFR
ALT: 130 U/L — ABNORMAL HIGH (ref 0–44)
AST: 49 U/L — ABNORMAL HIGH (ref 15–41)
Albumin: 2.3 g/dL — ABNORMAL LOW (ref 3.5–5.0)
Alkaline Phosphatase: 211 U/L — ABNORMAL HIGH (ref 38–126)
Anion gap: 9 (ref 5–15)
BUN: 15 mg/dL (ref 8–23)
CO2: 27 mmol/L (ref 22–32)
Calcium: 8.4 mg/dL — ABNORMAL LOW (ref 8.9–10.3)
Chloride: 98 mmol/L (ref 98–111)
Creatinine, Ser: 1.02 mg/dL (ref 0.61–1.24)
GFR, Estimated: >60 mL/min (ref >60)
Glucose, Bld: 212 mg/dL — ABNORMAL HIGH (ref 70–99)
Potassium: 3.5 mmol/L (ref 3.5–5.1)
Sodium: 134 mmol/L — ABNORMAL LOW (ref 135–145)
Total Bilirubin: 4.8 mg/dL — ABNORMAL HIGH (ref 0.0–1.2)
Total Protein: 5.4 g/dL — ABNORMAL LOW (ref 6.5–8.1)

## 2023-09-18 LAB — GLUCOSE, CAPILLARY
Glucose-Capillary: 199 mg/dL — ABNORMAL HIGH (ref 70–99)
Glucose-Capillary: 243 mg/dL — ABNORMAL HIGH (ref 70–99)

## 2023-09-18 LAB — PROTIME-INR
INR: 1.3 — ABNORMAL HIGH (ref 0.8–1.2)
Prothrombin Time: 17.4 s — ABNORMAL HIGH (ref 11.4–15.2)

## 2023-09-18 LAB — LIPASE, BLOOD: Lipase: 16 U/L (ref 11–51)

## 2023-09-18 MED ORDER — PANTOPRAZOLE SODIUM 40 MG PO TBEC: 40.0000 mg | DELAYED_RELEASE_TABLET | Freq: Three times a day (TID) | ORAL | Status: AC

## 2023-09-18 MED ORDER — PANCRELIPASE (LIP-PROT-AMYL) 36000-114000 UNITS PO CPEP: 72000.0000 [IU] | ORAL_CAPSULE | Freq: Three times a day (TID) | ORAL | Status: DC

## 2023-09-18 MED ORDER — HEPARIN SOD (PORK) LOCK FLUSH 100 UNIT/ML IV SOLN
500.0000 [IU] | INTRAVENOUS | Status: AC | PRN
  Administered 2023-09-18: 500 [IU]

## 2023-09-18 MED ORDER — PROCHLORPERAZINE MALEATE 10 MG PO TABS: 10.0000 mg | ORAL_TABLET | Freq: Four times a day (QID) | ORAL | Status: DC | PRN

## 2023-09-18 MED ORDER — AMOXICILLIN-POT CLAVULANATE 875-125 MG PO TABS: 1.0000 | ORAL_TABLET | Freq: Two times a day (BID) | ORAL | 0 refills | Status: AC

## 2023-09-18 MED ORDER — ONDANSETRON HCL 8 MG PO TABS: 8.0000 mg | ORAL_TABLET | Freq: Three times a day (TID) | ORAL | 1 refills | Status: DC | PRN

## 2023-09-18 MED ORDER — AMOXICILLIN-POT CLAVULANATE 875-125 MG PO TABS
1.0000 | ORAL_TABLET | Freq: Two times a day (BID) | ORAL | Status: DC
  Administered 2023-09-18: 1 via ORAL
  Filled 2023-09-18: qty 1

## 2023-09-18 MED ORDER — PROPOFOL 1000 MG/100ML IV EMUL
INTRAVENOUS | Status: AC
  Filled 2023-09-18: qty 300

## 2023-09-18 NOTE — Care Management Important Message (Signed)
 Important Message  Patient Details  Name: Alexander [name] MRN: [id number] Date of Birth: [birth year]   Important Message Given:  Yes - Medicare IM     Claretta Deed 09/18/2023, 4:25 PM

## 2023-09-18 NOTE — Discharge Summary (Addendum)
 Physician Discharge Summary  Alexander [name] FMW:[id number] DOB: 01-14-1940 DOA: 09/15/2023  PCP: Merna Huxley, NP  Admit date: 09/15/2023 Discharge date: 09/18/2023  Time spent: 47 minutes  Recommendations for Outpatient Follow-up:  Chem-12 CBC INR in 3 to 5 days in the outpatient setting Resume XRT as per Dr. Mikeal  to be adjusted by him-May need further goals of care discussions depending on intent of goals with shared decision making Should follow-up with his usual dentist for dental issues  Discharge Diagnoses:  MAIN problem for hospitalization   Cholangitis in the setting of CBD obstruction Metastatic pancreatic CA not amenable to Whipple  Please see below for itemized issues addressed in HOpsital- refer to other progress notes for clarity if needed  Discharge Condition: Improved  Diet recommendation: Heart healthy diabetic  Filed Weights   09/15/23 0746  Weight: 70.3 kg    History of present illness:  83Y very active home dwelling moved here from [place] retired Emergency planning/management officer Lives with wife-she has moderate dementia he takes care of her does all ADLs IDLs for her and is the main caregiver Swims several hours a day including walking  cT2 N0 borderline resectable pancreatic adenocarcinoma 12 cycles neoadjuvant FOLFIRINOX 10/31/2022-->5/8/025-is on Creon --started neoadjuvant XRT and Gemzar  08/27/2023 Follows Dr. Luke at [facility] for his pancreatic cancer-locally follows with our own dR.  Ennever--- he is not a candidate for Whipple procedure and has been referred back to Dr. Timmy for this purpose for medical management and XRT Status post 3 right lower lobe nodules resected with VATS wedge resection 08/03/2023 by Dr. 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 in the setting of recent chemoinfusion 9/9 Also recent tooth infection GI consulted 9/15 ERCP sphincterotomy open metal stent in biliary tree from major papula completely occluded-pus emerging-0.035 inch X260 sonometer straight Hydra Jagwire passed into the biliary tree-sphincterotome passed over guidewire deeply cannulated--evidence of distal biliary stenosis in the ampulla due to tissue ingrowth even after cleanout-1 covered metal stent placed in CBD to transverse this area and allow full expansion-very small chance of cholecystitis  He is stabilized and met maximal hospital stay after this procedure and was able to go home on 9/16 and should follow-up with his specialists        Assessment  & Plan :    CBD obstruction status post ERCP as above frank cholangitis confirmed-tissue growth occluded by biliary stent currently Patient completely seems to have resolved in terms of abdominal symptomatology no pain no fever It was not felt that he had any other diagnoses such as cholecystitis will complete a total of 7 days as per GI of antibiotics with Augmentin  Lipase labs are normal and he is eating a full meal He is able to discharge home today Take specialized dosing of Protonix  40 3 times daily?  GI to comment as an outpatient Metastatic pancreatic cancer status post 1 cycle of chemo above Currently neoadjuvant therapy which is on hold for now            Will require  outpatient goals of care discussions with Dr. Simeon confirms DNR but would want treatment May need to consolidate/alter chemo/Gemzar  as  well as XRT and discuss @ next office visit with Oncology Recent VATS for lung masse Has metastatic disease as above-outpatient follow[facility]up at [facility] He did not want to go to radiation today and should follow-up with radiation oncologist-CC at discharge Dr. Lucian Recent tooth infection      Stopped Amoxil  ---on augmentin  as per MAR-follow-up dentist as an outpatient Diabetes mellitus         insulins adjusted-currently on Lantus  16 daily--sugars were well-controlled during hospital stay-he knows how to adjust his pump as well as sliding scale Depression Continue Zyprexa  5 daily  Discharge Exam: Vitals:   09/18/23 0500 09/18/23 0759  BP: (!) 156/82 (!) 142/79  Pulse: 68 69  Resp: 18 18  Temp: (!) 97.5 F (36.4 C)   SpO2: 98% 99%    Subj on day of d/c   Walked over 500 feet with mobility no abdominal pain whatsoever  General Exam on discharge  EOMI NCAT no focal deficit Some icterus no pallor Moderate dentition S1-S2 no murmur No hepatosplenomegaly ROM intact Power 5/5  Discharge Instructions   Discharge Instructions     Diet - low sodium heart healthy   Complete by: As directed    Discharge instructions   Complete by: As directed    I am sorry that you were admitted but it looks like you had a blocked stent in your bile ducts This was managed and you will need to continue antibiotics as recommended If you have recurrent pain you need to come back immediately to the ER-it is doubtful however that you have anything going on with your gallbladder Take all of your other meds that you have been taking without change Happy 60th anniversary enjoy yourself and please follow-up with our good friend Dr. Timmy soon-take care   Increase activity slowly   Complete by: As directed       Allergies as of 09/18/2023       Reactions   Metformin  And Related Diarrhea   Metformin  Hcl Diarrhea   Cheese Diarrhea   Allergy to soft cheese, can have hard cheese   Milk-related  Compounds Diarrhea        Medication List     TAKE these medications    Accu-Chek Softclix Lancets lancets Use to test blood glucose once daily What changed:  how much to take how to take this when to take this reasons to take this additional instructions   amLODipine  10 MG tablet Commonly known as: NORVASC  Take 1 tablet (10 mg total) by mouth daily. What changed: when to take this   amoxicillin -clavulanate 875-125 MG tablet Commonly known as: AUGMENTIN  Take 1 tablet by mouth every 12 (twelve) hours for 4 days.   Droplet Pen Needles 31G X 6 MM Misc Generic drug: Insulin  Pen Needle Use to check blood sugar TID   BD Pen Needle Nano 2nd Gen 32G X 4 MM Misc Generic drug: Insulin  Pen Needle   Embrace Pro Glucose Test test strip Generic drug: glucose blood Use as instructed   FreeStyle Libre 3 Plus Sensor Misc Change sensor every 15 days.   Lantus  SoloStar 100 UNIT/ML Solostar Pen Generic drug: insulin  glargine INJECT 20 UNITS INTO THE SKIN 2 TIMES A DAY What changed:  how much to take how to take this when to take this additional instructions   lipase/protease/amylase 63999 UNITS Cpep capsule Commonly known as:  Creon  Take 2 capsules (72,000 Units total) by mouth 3 (three) times daily before meals. What changed:  medication strength See the new instructions.   NovoLOG  FlexPen 100 UNIT/ML FlexPen Generic drug: insulin  aspart 2-3 units with meals 3 times a day plus sliding scale as instructed, maximum 20units/day What changed: additional instructions   ondansetron  8 MG tablet Commonly known as: Zofran  Take 1 tablet (8 mg total) by mouth every 8 (eight) hours as needed for nausea or vomiting.   pantoprazole  40 MG tablet Commonly known as: PROTONIX  Take 1 tablet (40 mg total) by mouth 3 (three) times daily. What changed: when to take this   prochlorperazine  10 MG tablet Commonly known as: COMPAZINE  Take 1 tablet (10 mg total) by mouth every 6 (six)  hours as needed for nausea or vomiting.   saccharomyces boulardii 250 MG capsule Commonly known as: FLORASTOR Take 250 mg by mouth 2 (two) times daily.   vitamin D3 50 MCG (2000 UT) Caps Take 2,000 Units by mouth with breakfast, with lunch, and with evening meal.       Allergies  Allergen Reactions   Metformin  And Related Diarrhea   Metformin  Hcl Diarrhea   Cheese Diarrhea    Allergy to soft cheese, can have hard cheese   Milk-Related Compounds Diarrhea      The results of significant diagnostics from this hospitalization (including imaging, microbiology, ancillary and laboratory) are listed below for reference.    Significant Diagnostic Studies: DG ERCP Result Date: 09/17/2023 CLINICAL DATA:  Biliary dilatation on CT the patient suspected to have ascending cholangitis. EXAM: ERCP 10 fluoroscopic right upper quadrant intra procedural images and 2 cine loops submitted for interpretation TECHNIQUE: Multiple spot images obtained with the fluoroscopic device and submitted for interpretation post-procedure. FLUOROSCOPY: Radiation Exposure Index (as provided by the fluoroscopic device): 18.533 mGy Kerma COMPARISON:  None Available. FINDINGS: Images demonstrate indwelling metallic common bile duct stent with flexible endoscopy device and guidewire extending into the common bile duct through the stent into the right hepatic duct. Contrast injected and ERCP performed sweeping debris from the common bile duct. During the procedure cystic duct filling with contrast is identified. No contrast extravasation. Status post balloon sweep placement of a subsequent metallic stent extending beyond the original common bile duct stent IMPRESSION: ERCP and placement of longer common bile duct metallic stent. These images were submitted for radiologic interpretation only. Please see the procedural report for the amount of contrast and the fluoroscopy time utilized. Electronically Signed   By: Cordella Banner    On: 09/17/2023 16:49   CT ABDOMEN PELVIS W CONTRAST Result Date: 09/15/2023 CLINICAL DATA:  83 year old male with abdominal pain. Epigastric pain radiating to the back. Pancreatic cancer. * Tracking Code: BO * EXAM: CT ABDOMEN AND PELVIS WITH CONTRAST TECHNIQUE: Multidetector CT imaging of the abdomen and pelvis was performed using the standard protocol following bolus administration of intravenous contrast. RADIATION DOSE REDUCTION: This exam was performed according to the departmental dose-optimization program which includes automated exposure control, adjustment of the mA and/or kV according to patient size and/or use of iterative reconstruction technique. CONTRAST:  75mL OMNIPAQUE  IOHEXOL  350 MG/ML SOLN COMPARISON:  CT Abdomen and Pelvis 10/04/2022.  CTA 10/13/2022. FINDINGS: Lower chest: Partially visible right chest Port-A-Cath at the right atrium, appears to be new from last year. Stable heart size. No pericardial effusion. No pleural effusion. Increased curvilinear enhancing atelectasis and scarring at the lung bases from last year. However, right lung nodules ranging from 7-9 mm on  series 5, images 2 and 6 are new or progressed from last year. No other suspicious lung base nodule. Hepatobiliary: Metallic CBD stent now in place. Mild gallbladder distension similar to last year. Intrahepatic biliary ductal dilatation also not significantly changed from last year. Less pneumobilia. No discrete liver mass. Pancreas: Atrophied pancreatic body and tail with chronic pancreatic ductal dilatation which is regressed from last year. Diminutive head and uncinate. There is a greater degree of soft tissue along the distal CBD stent, and the soft tissue there appears somewhat spiculated on coronal image 55. When compared to 10/04/2022 this roughly 4 cm area is not obviously progressed. Spleen: Negative. Adrenals/Urinary Tract: Stable and negative: Stable and benign-appearing left renal midpole cyst (no follow up  imaging recommended). Symmetric renal enhancement and contrast excretion. Decompressed ureters. Unremarkable bladder. Occasional pelvic phleboliths. Stomach/Bowel: Gas distended rectum. Upstream decompressed sigmoid colon with advanced diverticulosis. Advanced diverticulosis in the descending colon. Occasional diverticula in the transverse colon. No active inflammation identified. Negative right colon. Normal gas containing appendix on series 3, image 57. Decompressed terminal ileum. Nondilated small bowel. Retained fluid and food, gas in the stomach. Duodenum appears decompressed. No pneumoperitoneum, free fluid, or mesenteric inflammation identified. Vascular/Lymphatic: Aortoiliac calcified atherosclerosis. Major arterial structures remain patent. Stable mild tortuosity of the abdominal aorta. Portal venous system is patent. No lymphadenopathy identified. Reproductive: Chronic fat containing right inguinal hernia appears stable. Other: No pelvis free fluid. Musculoskeletal: Chronic spine degeneration, hyperostosis. No acute or suspicious osseous lesion identified. IMPRESSION: 1. Sequelae of Obstructing Pancreatic Tumor with metal CBD stent now in place. Intra- and extrahepatic biliary ductal dilatation not significantly changed from last year. Main pancreatic ductal dilatation has regressed. Spiculated pancreatic soft tissue along the distal CBD stent not obviously progressed since CTA on 10/04/2022. 2. However, two right lung base nodules ranging from 7 mm to 9 mm are new or increased from last year and suspicious for Pulmonary Metastatic Disease. 3. No other acute finding in the abdomen or pelvis. 4.  Aortic Atherosclerosis (ICD10-I70.0). Electronically Signed   By: VEAR Hurst M.D.   On: 09/15/2023 06:31   DG Chest 2 View Result Date: 09/15/2023 CLINICAL DATA:  Chest pain EXAM: CHEST - 2 VIEW COMPARISON:  10/13/2022 FINDINGS: Cardiac shadows within normal limits. Right chest wall port is noted in satisfactory  position. Lungs are clear. Postsurgical changes in the right base are noted. No acute bony abnormality is noted. IMPRESSION: No acute abnormality noted. Electronically Signed   By: Oneil Devonshire M.D.   On: 09/15/2023 04:06    Microbiology: No results found for this or any previous visit (from the past 240 hours).   Labs: Basic Metabolic Panel: Recent Labs  Lab 09/11/23 1116 09/15/23 0322 09/15/23 0345 09/16/23 0135 09/17/23 0236 09/18/23 0335  NA 139 142 141 135 134* 134*  K 3.9 4.0 4.0 4.1 3.4* 3.5  CL 104 103 104 100 100 98  CO2 23  --  23 24 25 27   GLUCOSE 178* 156* 153* 128* 135* 212*  BUN 18 17 15 12 10 15   CREATININE 1.03 1.10 1.04 1.08 1.04 1.02  CALCIUM  9.1  --  9.4 8.7* 8.3* 8.4*   Liver Function Tests: Recent Labs  Lab 09/11/23 1116 09/15/23 0550 09/16/23 0135 09/17/23 0236 09/18/23 0335  AST 19 333* 248* 81* 49*  ALT 14 180* 313* 184* 130*  ALKPHOS 121 219* 196* 203* 211*  BILITOT 0.6 1.9* 3.6* 4.8* 4.8*  PROT 6.6 6.5 5.7* 5.6* 5.4*  ALBUMIN 3.9 3.4*  2.8* 2.4* 2.3*   Recent Labs  Lab 09/15/23 0550 09/18/23 0335  LIPASE 18 16   No results for input(s): AMMONIA in the last 168 hours. CBC: Recent Labs  Lab 09/11/23 1116 09/15/23 0322 09/15/23 0345 09/16/23 0135 09/17/23 0236 09/18/23 0335  WBC 4.2  --  7.3 12.2* 10.0 6.4  NEUTROABS 2.9  --   --  10.2* 8.9* 5.5  HGB 11.9* 13.6 13.1 11.2* 11.2* 11.2*  HCT 36.0* 40.0 39.5 33.0* 33.4* 33.1*  MCV 93.0  --  95.2 92.7 94.4 93.0  PLT 98*  --  195 162 166 160   Cardiac Enzymes: No results for input(s): CKTOTAL, CKMB, CKMBINDEX, TROPONINI in the last 168 hours. BNP: BNP (last 3 results) No results for input(s): BNP in the last 8760 hours.  ProBNP (last 3 results) No results for input(s): PROBNP in the last 8760 hours.  CBG: Recent Labs  Lab 09/17/23 1253 09/18/23 0755  GLUCAP 120* 199*    Signed:  Colen Grimes MD   [facility] 09/18/2023, 11:04 AM

## 2023-09-18 NOTE — Progress Notes (Signed)
 He underwent a successful procedure yesterday.  He feels a whole lot better today.  Unfortunately, his labs do not look all that much different.  Bilirubin is 4.8.  SGPT 130 SGOT 49.  His blood sugar is 212.  Again he feels well.  He feels like he can eat.  He is having no fever.  He is having no problems with bowels or bladder.  There is been no cough or shortness of breath.  He probably would want to go home.  We can certainly follow-up with his labs in the office.  His CBC shows white count 6.4.  Hemoglobin 11.2.  Platelet count 160,000.  He has had no bleeding.  He has had no mouth sores.  He has had no leg pain or swelling.  Again, he had a successful procedure yesterday.  I am unsure if cultures were taken.    His vital signs show temperature of 97.5.  Pulse 68.  Blood pressure 156/82.  Head neck exam shows no ocular or oral lesions.  There is still some slight scleral icterus.  He has no adenopathy in the neck.  Lungs are clear bilaterally.  Cardiac exam regular rate and rhythm.  Abdomen is soft.  There is no tenderness in the right upper quadrant.  He has good bowel sounds.  There is no fluid wave.  There is no palpable liver or spleen tip.  Extremity shows no clubbing, cyanosis or edema.  It sounds like he had cholangitis.  It sounds like this may be from gallstone that may have gotten to the duct.  It was flushed out.  Again, I would suspect that his LFTs will come down.  Again, if you would like to go home I do not have a problem of that from my point of view.  I do appreciate everybody's help.  Jeralyn Crease, MD  Deuteronomy 11:18

## 2023-09-18 NOTE — TOC Transition Note (Signed)
 Transition of Care [facility]) - Discharge Note   Patient Details  Name: Alexander [name] MRN: [id number] Date of Birth: Feb 14, 1940  [facility]) CM/SW Contact:  Tom-Johnson, Harvest Muskrat, RN Phone Number: 09/18/2023, 12:24 PM   Clinical Narrative:     Patient is scheduled for discharge today.  Readmission Risk Assessment done. Outpatient f/u, hospital f/u and discharge instructions on AVS. No ICM needs or recommendations noted. Wife, Orie at bedside, friend Octaviano to transport at discharge.  No further ICM needs noted.       Final next level of care: Home/Self Care Barriers to Discharge: Barriers Resolved   Patient Goals and CMS Choice Patient states their goals for this hospitalization and ongoing recovery are:: To return home CMS Medicare.gov Compare Post Acute Care list provided to:: Patient Choice offered to / list presented to : NA      Discharge Placement                Patient to be transferred to facility by: Alyse Octaviano Pugh Name of family member notified: Wife, Orie at bedside.    Discharge Plan and Services Additional resources added to the After Visit Summary for                  DME Arranged: N/A DME Agency: NA       HH Arranged: NA HH Agency: NA        Social Drivers of Health (SDOH) Interventions SDOH Screenings   Food Insecurity: No Food Insecurity (09/15/2023)  Housing: Low Risk  (09/15/2023)  Transportation Needs: No Transportation Needs (09/15/2023)  Utilities: Not At Risk (09/15/2023)  Alcohol Screen: Low Risk  (09/09/2021)  Depression (PHQ2-9): Low Risk  (08/27/2023)  Financial Resource Strain: Low Risk  (08/11/2023)  Physical Activity: Sufficiently Active (08/11/2023)  Social Connections: Socially Integrated (09/15/2023)  Stress: No Stress Concern Present (08/11/2023)  Tobacco Use: Low Risk  (09/17/2023)  Recent Concern: Tobacco Use - Medium Risk (08/03/2023)   Received from [facility]  Health Literacy: Low Risk   (10/03/2022)   Received from [facility]     Readmission Risk Interventions    09/18/2023   12:21 PM  Readmission Risk Prevention Plan  Post Dischage Appt Complete  Medication Screening Complete  Transportation Screening Complete

## 2023-09-18 NOTE — Progress Notes (Signed)
   09/18/23 1002  Mobility  Activity Ambulated independently  Level of Assistance Independent  Assistive Device None  Distance Ambulated (ft) 1500 ft  Activity Response Tolerated well  Mobility Referral Yes  Mobility visit 1 Mobility  Mobility Specialist Start Time (ACUTE ONLY) 1002  Mobility Specialist Stop Time (ACUTE ONLY) 1017  Mobility Specialist Time Calculation (min) (ACUTE ONLY) 15 min   Mobility Specialist: Progress Note  Pt agreeable to mobility session - received in bed. Pt was asymptomatic throughout session with no complaints.Returned to bed with all needs met - call bell within reach. Wife present.   Virgle Boards, BS Mobility Specialist Please contact via SecureChat or  Rehab office at [phone].

## 2023-09-18 NOTE — Telephone Encounter (Signed)
 FYI

## 2023-09-18 NOTE — Progress Notes (Signed)
 Patient was scheduled to come in today for follow up and treatment. He is currently admitted. He was seen for abdominal pain and was found to have an obstruction. He is close to discharge.   Oncology Nurse Navigator Documentation     09/18/2023    9:45 AM  Oncology Nurse Navigator Flowsheets  Navigator Follow Up Date: 09/25/2023  Navigator Follow Up Reason: Follow-up Appointment;Chemotherapy  Navigator Location [facility]  Navigator Encounter Type Appt/Treatment Plan Review  Patient Visit Type MedOnc  Treatment Phase Active Tx  Barriers/Navigation Needs Coordination of Care  Interventions None Required  Acuity Level 2-Minimal Needs (1-2 Barriers Identified)  Support Groups/Services Friends and Family  Time Spent with Patient 15

## 2023-09-19 ENCOUNTER — Other Ambulatory Visit: Payer: Self-pay

## 2023-09-19 ENCOUNTER — Ambulatory Visit
Admission: RE | Admit: 2023-09-19 | Discharge: 2023-09-19 | Disposition: A | Discharge status: Home / Self Care | Source: Ambulatory Visit | Attending: Radiation Oncology | Admitting: Radiation Oncology

## 2023-09-19 ENCOUNTER — Telehealth: Payer: Self-pay

## 2023-09-19 DIAGNOSIS — C250 Malignant neoplasm of head of pancreas: Secondary | ICD-10-CM | POA: Diagnosis present

## 2023-09-19 LAB — RAD ONC ARIA SESSION SUMMARY
Course Elapsed Days: 21
Plan Fractions Treated to Date: 13
Plan Prescribed Dose Per Fraction: 1.8 Gy
Plan Total Fractions Prescribed: 25
Plan Total Prescribed Dose: 45 Gy
Reference Point Dosage Given to Date: 23.4 Gy
Reference Point Session Dosage Given: 1.8 Gy
Session Number: 13

## 2023-09-19 NOTE — Transitions of Care (Post Inpatient/ED Visit) (Signed)
   09/19/2023  Name: Alexander [name] MRN: [id number] DOB: 12-30-1940  Today's TOC FU Call Status: Today's TOC FU Call Status:: Successful TOC FU Call Completed TOC FU Call Complete Date: 09/19/23 (patient reports that he can not talk right now. headed to radiation in about 30 minutes. Will call back in the am.) Patient's Name and Date of Birth confirmed.  Transition Care Management Follow-up Telephone Call Date of Discharge: 09/18/23 Discharge Facility: [facility]) Type of Discharge: Inpatient Admission Primary Inpatient Discharge Diagnosis:: Choledocholithiasis How have you been since you were released from the hospital?: Better (reports feeling better but did not sleep well last night due to sleeping all day yesterday.)   Patient unable to complete call due to radiation. Will plan to call tomorrow. Alan Ee, RN, BSN, CEN [employer]- Transition of Care Team.  [facility] [phone]

## 2023-09-20 ENCOUNTER — Telehealth: Payer: Self-pay

## 2023-09-20 ENCOUNTER — Other Ambulatory Visit: Payer: Self-pay

## 2023-09-20 ENCOUNTER — Inpatient Hospital Stay

## 2023-09-20 ENCOUNTER — Ambulatory Visit
Admission: RE | Admit: 2023-09-20 | Discharge: 2023-09-20 | Disposition: A | Discharge status: Home / Self Care | Source: Ambulatory Visit | Attending: Radiation Oncology | Admitting: Radiation Oncology

## 2023-09-20 ENCOUNTER — Ambulatory Visit: Payer: Self-pay | Admitting: Hematology & Oncology

## 2023-09-20 DIAGNOSIS — Z5111 Encounter for antineoplastic chemotherapy: Secondary | ICD-10-CM | POA: Diagnosis present

## 2023-09-20 DIAGNOSIS — C259 Malignant neoplasm of pancreas, unspecified: Secondary | ICD-10-CM | POA: Diagnosis present

## 2023-09-20 DIAGNOSIS — C250 Malignant neoplasm of head of pancreas: Secondary | ICD-10-CM | POA: Diagnosis not present

## 2023-09-20 DIAGNOSIS — C7800 Secondary malignant neoplasm of unspecified lung: Secondary | ICD-10-CM | POA: Diagnosis present

## 2023-09-20 DIAGNOSIS — Z794 Long term (current) use of insulin: Secondary | ICD-10-CM

## 2023-09-20 DIAGNOSIS — Z79899 Other long term (current) drug therapy: Secondary | ICD-10-CM | POA: Diagnosis not present

## 2023-09-20 DIAGNOSIS — I10 Essential (primary) hypertension: Secondary | ICD-10-CM

## 2023-09-20 LAB — CMP (CANCER CENTER ONLY)
ALT: 98 U/L — ABNORMAL HIGH (ref 0–44)
AST: 28 U/L (ref 15–41)
Albumin: 3.9 g/dL (ref 3.5–5.0)
Alkaline Phosphatase: 317 U/L — ABNORMAL HIGH (ref 38–126)
Anion gap: 7 (ref 5–15)
BUN: 23 mg/dL (ref 8–23)
CO2: 32 mmol/L (ref 22–32)
Calcium: 9 mg/dL (ref 8.9–10.3)
Chloride: 99 mmol/L (ref 98–111)
Creatinine: 1.08 mg/dL (ref 0.61–1.24)
GFR, Estimated: >60 mL/min (ref >60)
Glucose, Bld: 232 mg/dL — ABNORMAL HIGH (ref 70–99)
Potassium: 3.2 mmol/L — ABNORMAL LOW (ref 3.5–5.1)
Sodium: 138 mmol/L (ref 135–145)
Total Bilirubin: 2.4 mg/dL — ABNORMAL HIGH (ref 0.0–1.2)
Total Protein: 7.4 g/dL (ref 6.5–8.1)

## 2023-09-20 LAB — CBC WITH DIFFERENTIAL (CANCER CENTER ONLY)
Abs Immature Granulocytes: 0.27 K/uL — ABNORMAL HIGH (ref 0.00–0.07)
Basophils Absolute: 0.1 K/uL (ref 0.0–0.1)
Basophils Relative: 1 %
Eosinophils Absolute: 0.1 K/uL (ref 0.0–0.5)
Eosinophils Relative: 1 %
HCT: 38.3 % — ABNORMAL LOW (ref 39.0–52.0)
Hemoglobin: 13.2 g/dL (ref 13.0–17.0)
Immature Granulocytes: 3 %
Lymphocytes Relative: 8 %
Lymphs Abs: 0.7 K/uL (ref 0.7–4.0)
MCH: 31.1 pg (ref 26.0–34.0)
MCHC: 34.5 g/dL (ref 30.0–36.0)
MCV: 90.3 fL (ref 80.0–100.0)
Monocytes Absolute: 1.6 K/uL — ABNORMAL HIGH (ref 0.1–1.0)
Monocytes Relative: 18 %
Neutro Abs: 5.9 K/uL (ref 1.7–7.7)
Neutrophils Relative %: 69 %
Platelet Count: 290 K/uL (ref 150–400)
RBC: 4.24 MIL/uL (ref 4.22–5.81)
RDW: 15.3 % (ref 11.5–15.5)
WBC Count: 8.6 K/uL (ref 4.0–10.5)
nRBC: 0 % (ref 0.0–0.2)

## 2023-09-20 LAB — RAD ONC ARIA SESSION SUMMARY
Course Elapsed Days: 22
Plan Fractions Treated to Date: 14
Plan Prescribed Dose Per Fraction: 1.8 Gy
Plan Total Fractions Prescribed: 25
Plan Total Prescribed Dose: 45 Gy
Reference Point Dosage Given to Date: 25.2 Gy
Reference Point Session Dosage Given: 1.8 Gy
Session Number: 14

## 2023-09-20 LAB — LACTATE DEHYDROGENASE: LDH: 147 U/L (ref 98–192)

## 2023-09-20 NOTE — Transitions of Care (Post Inpatient/ED Visit) (Signed)
 09/20/2023  Name: Alexander [name] MRN: [id number] DOB: 1940/03/16  Today's TOC FU Call Status: TOC FU Call Complete Date: 09/20/23 Patient's Name and Date of Birth confirmed.  Transition Care Management Follow-up Telephone Call How have you been since you were released from the hospital?: Better (less weakness) Any questions or concerns?: No  Items Reviewed: Did you receive and understand the discharge instructions provided?: Yes Medications obtained,verified, and reconciled?: Yes (Medications Reviewed) Any new allergies since your discharge?: No Dietary orders reviewed?: NA (per Dr. Timmy not specific diet.  Eating what ever you want.) Do you have support at home?: Yes People in Home [RPT]: spouse Name of Support/Comfort Primary Source: wife  Medications Reviewed Today: Medications Reviewed Today     Reviewed by Rumalda Alan PENNER, RN (Registered Nurse) on 09/20/23 at 1036  Med List Status: <None>   Medication Order Taking? Sig Documenting Provider Last Dose Status Informant  Accu-Chek Softclix Lancets lancets [id number] Yes Use to test blood glucose once daily Nafziger, Darleene, NP  Active Self, Pharmacy Records  amLODipine  (NORVASC ) 10 MG tablet [id number] Yes Take 1 tablet (10 mg total) by mouth daily. Nafziger, Darleene, NP  Active Self, Pharmacy Records  amoxicillin -clavulanate (AUGMENTIN ) 875-125 MG tablet [id number] Yes Take 1 tablet by mouth every 12 (twelve) hours for 4 days. Samtani, Jai-Gurmukh, MD  Active   BD PEN NEEDLE NANO 2ND GEN 32G X 4 MM MISC [id number] Yes  [provider]  Active Self, Pharmacy Records  Cholecalciferol (VITAMIN D3) 50 MCG (2000 UT) CAPS [id number] Yes Take 2,000 Units by mouth with breakfast, with lunch, and with evening meal. [provider]  Active Self, Pharmacy Records  Continuous Glucose Sensor (FREESTYLE LIBRE 3 PLUS SENSOR) MISC [id number] Yes Change sensor every 15 days. Merna Darleene, NP  Active Self, Pharmacy Records  glucose  blood (EMBRACE PRO GLUCOSE TEST) test strip [id number] Yes Use as instructed Nafziger, Cory, NP  Active Self, Pharmacy Records  insulin  aspart (NOVOLOG  FLEXPEN) 100 UNIT/ML FlexPen [id number] Yes 2-3 units with meals 3 times a day plus sliding scale as instructed, maximum 20units/day Thapa, [place], MD  Active Self, Pharmacy Records  insulin  glargine (LANTUS  SOLOSTAR) 100 UNIT/ML Solostar Pen [id number] Yes INJECT 20 UNITS INTO THE SKIN 2 TIMES A DAY  Patient taking differently: Inject 16 Units into the skin daily. INJECT 16 UNITS INTO THE SKIN daily   Nafziger, Darleene, NP  Active Self, Pharmacy Records           Med Note RENNIS, DUROJAHYE' R   Sat Sep 15, 2023  9:56 AM) Changes on days where he has chemo  Insulin  Pen Needle (DROPLET PEN NEEDLES) 31G X 6 MM MISC [id number] Yes Use to check blood sugar TID Nafziger, Cory, NP  Active Self, Pharmacy Records  lipase/protease/amylase (CREON ) 36000 UNITS CPEP capsule [id number] Yes Take 2 capsules (72,000 Units total) by mouth 3 (three) times daily before meals.  Patient taking differently: Take 2 capsules (72,000 Units total) by mouth 3 (three) times daily before meals.   Samtani, Jai-Gurmukh, MD  Active   ondansetron  (ZOFRAN ) 8 MG tablet [id number] Yes Take 1 tablet (8 mg total) by mouth every 8 (eight) hours as needed for nausea or vomiting. Samtani, Jai-Gurmukh, MD  Active   pantoprazole  (PROTONIX ) 40 MG tablet [id number] Yes Take 1 tablet (40 mg total) by mouth 3 (three) times daily. Samtani, Jai-Gurmukh, MD  Active   prochlorperazine  (COMPAZINE ) 10 MG tablet [id number] Yes Take 1 tablet (10 mg total) by mouth every 6 (six)  hours as needed for nausea or vomiting. Samtani, Jai-Gurmukh, MD  Active   saccharomyces boulardii (FLORASTOR) 250 MG capsule [id number] Yes Take 250 mg by mouth 2 (two) times daily. [provider]  Active Self, Pharmacy Records            Home Care and Equipment/Supplies: Were Home Health Services Ordered?: No Any new  equipment or medical supplies ordered?: No  Functional Questionnaire: Do you need assistance with bathing/showering or dressing?: No Do you need assistance with meal preparation?: No Do you need assistance with eating?: No Do you have difficulty maintaining continence: No Do you need assistance with getting out of bed/getting out of a chair/moving?: No Do you have difficulty managing or taking your medications?: No  Follow up appointments reviewed: PCP Follow-up appointment confirmed?: No (reports that he sees Dr. Timmy only) MD Provider Line Number:[phone] Given: No Specialist Hospital Follow-up appointment confirmed?: Yes Date of Specialist follow-up appointment?: 09/25/23 Follow-Up Specialty Provider:: Ennever Do you need transportation to your follow-up appointment?: No Do you understand care options if your condition(s) worsen?: Yes-patient verbalized understanding  SDOH Interventions Today    Flowsheet Row Most Recent Value  SDOH Interventions   Food Insecurity Interventions Intervention Not Indicated  Housing Interventions Intervention Not Indicated  Transportation Interventions Intervention Not Indicated  Utilities Interventions Intervention Not Indicated    Today's Vitals   09/20/23 1043  Weight: 150 lb (68 kg)  PainSc: 0-No pain     Goals Addressed             This Visit's Progress               [facility] Transitions of Care (TOC) Care Plan       Problems:  Recent Hospitalization for treatment of pancreatic cancer, ERCP   Goal:  Over the next 30 days, the patient will not experience hospital readmission  Interventions:  Transitions of Care: Doctor Visits  - discussed the importance of doctor visits Reviewed Signs and symptoms of infection Encouraged patient to see PCP as well as oncology Reviewed all medications per discharge. Encouraged patient to take all medications as prescribed. Reviewed current weight Reviewed change in taste with cancer  treatment Reviewed importance of taking all of augmentin  with foods. Reviewed side effect of diarrhea. Reviewed an offered 30 day TOC program and patient consented.  Scheduled next appointment and provided my contact information. Encouraged patient to stay as active as possible.  Oncology: Assessment of understanding of oncology diagnosis:  Assessed patient understanding of cancer diagnosis and recommended treatment plan Reviewed upcoming provider appointments and treatment appointments Assessed available transportation to appointments and treatments. Has consistent/reliable transportation: Yes Assessed support system. Has consistent/reliable family or other support: Yes PHQ2/PHQ9 performed Encouraged patient to again continue to eat the best that he can  Patient Self Care Activities:  Attend all scheduled provider appointments Call pharmacy for medication refills 3-7 days in advance of running out of medications Call provider office for new concerns or questions  Take medications as prescribed   See MD as planned  Plan:  Telephone follow up appointment with care management team member scheduled for:  09/27/2023 at 11 am The patient has been provided with contact information for the care management team and has been advised to call with any health related questions or concerns.       This note sent to MD.  Alan Ee, RN, BSN, CEN [employer] [facility]- Transition of Care Team.  [facility] [phone]

## 2023-09-21 ENCOUNTER — Ambulatory Visit
Admission: RE | Admit: 2023-09-21 | Discharge: 2023-09-21 | Disposition: A | Discharge status: Home / Self Care | Source: Ambulatory Visit | Attending: Radiation Oncology | Admitting: Radiation Oncology

## 2023-09-21 ENCOUNTER — Other Ambulatory Visit: Payer: Self-pay

## 2023-09-21 DIAGNOSIS — C250 Malignant neoplasm of head of pancreas: Secondary | ICD-10-CM | POA: Diagnosis not present

## 2023-09-21 LAB — RAD ONC ARIA SESSION SUMMARY
Course Elapsed Days: 23
Plan Fractions Treated to Date: 15
Plan Prescribed Dose Per Fraction: 1.8 Gy
Plan Total Fractions Prescribed: 25
Plan Total Prescribed Dose: 45 Gy
Reference Point Dosage Given to Date: 27 Gy
Reference Point Session Dosage Given: 1.8 Gy
Session Number: 15

## 2023-09-21 LAB — CANCER ANTIGEN 19-9: CA 19-9: 280 U/mL — ABNORMAL HIGH (ref 0–35)

## 2023-09-24 ENCOUNTER — Other Ambulatory Visit: Payer: Self-pay

## 2023-09-24 ENCOUNTER — Ambulatory Visit
Admission: RE | Admit: 2023-09-24 | Discharge: 2023-09-24 | Disposition: A | Discharge status: Home / Self Care | Source: Ambulatory Visit | Attending: Radiation Oncology | Admitting: Radiation Oncology

## 2023-09-24 DIAGNOSIS — C250 Malignant neoplasm of head of pancreas: Secondary | ICD-10-CM | POA: Diagnosis not present

## 2023-09-24 LAB — RAD ONC ARIA SESSION SUMMARY
Course Elapsed Days: 26
Plan Fractions Treated to Date: 16
Plan Prescribed Dose Per Fraction: 1.8 Gy
Plan Total Fractions Prescribed: 25
Plan Total Prescribed Dose: 45 Gy
Reference Point Dosage Given to Date: 28.8 Gy
Reference Point Session Dosage Given: 1.8 Gy
Session Number: 16

## 2023-09-25 ENCOUNTER — Ambulatory Visit

## 2023-09-25 ENCOUNTER — Inpatient Hospital Stay

## 2023-09-25 ENCOUNTER — Inpatient Hospital Stay (HOSPITAL_BASED_OUTPATIENT_CLINIC_OR_DEPARTMENT_OTHER): Admitting: Medical Oncology

## 2023-09-25 ENCOUNTER — Encounter: Payer: Self-pay | Admitting: *Deleted

## 2023-09-25 ENCOUNTER — Other Ambulatory Visit: Payer: Self-pay

## 2023-09-25 ENCOUNTER — Ambulatory Visit
Admission: RE | Admit: 2023-09-25 | Discharge: 2023-09-25 | Disposition: A | Discharge status: Home / Self Care | Source: Ambulatory Visit | Attending: Radiation Oncology | Admitting: Radiation Oncology

## 2023-09-25 VITALS — BP 108/70 | HR 58 | Temp 97.7°F | Resp 17 | Ht 71.0 in | Wt 149.4 lb

## 2023-09-25 VITALS — BP 123/70 | HR 49 | Resp 17

## 2023-09-25 DIAGNOSIS — C252 Malignant neoplasm of tail of pancreas: Secondary | ICD-10-CM

## 2023-09-25 DIAGNOSIS — D649 Anemia, unspecified: Secondary | ICD-10-CM

## 2023-09-25 DIAGNOSIS — C250 Malignant neoplasm of head of pancreas: Secondary | ICD-10-CM | POA: Diagnosis not present

## 2023-09-25 DIAGNOSIS — Z5111 Encounter for antineoplastic chemotherapy: Secondary | ICD-10-CM | POA: Diagnosis not present

## 2023-09-25 LAB — CBC WITH DIFFERENTIAL (CANCER CENTER ONLY)
Abs Immature Granulocytes: 0.36 K/uL — ABNORMAL HIGH (ref 0.00–0.07)
Basophils Absolute: 0.1 K/uL (ref 0.0–0.1)
Basophils Relative: 1 %
Eosinophils Absolute: 0.1 K/uL (ref 0.0–0.5)
Eosinophils Relative: 2 %
HCT: 33.2 % — ABNORMAL LOW (ref 39.0–52.0)
Hemoglobin: 11.3 g/dL — ABNORMAL LOW (ref 13.0–17.0)
Immature Granulocytes: 4 %
Lymphocytes Relative: 7 %
Lymphs Abs: 0.6 K/uL — ABNORMAL LOW (ref 0.7–4.0)
MCH: 32 pg (ref 26.0–34.0)
MCHC: 34 g/dL (ref 30.0–36.0)
MCV: 94.1 fL (ref 80.0–100.0)
Monocytes Absolute: 1.6 K/uL — ABNORMAL HIGH (ref 0.1–1.0)
Monocytes Relative: 18 %
Neutro Abs: 6.4 K/uL (ref 1.7–7.7)
Neutrophils Relative %: 68 %
Platelet Count: 316 K/uL (ref 150–400)
RBC: 3.53 MIL/uL — ABNORMAL LOW (ref 4.22–5.81)
RDW: 15.8 % — ABNORMAL HIGH (ref 11.5–15.5)
WBC Count: 9.2 K/uL (ref 4.0–10.5)
nRBC: 0 % (ref 0.0–0.2)

## 2023-09-25 LAB — CMP (CANCER CENTER ONLY)
ALT: 40 U/L (ref 0–44)
AST: 22 U/L (ref 15–41)
Albumin: 3.4 g/dL — ABNORMAL LOW (ref 3.5–5.0)
Alkaline Phosphatase: 254 U/L — ABNORMAL HIGH (ref 38–126)
Anion gap: 11 (ref 5–15)
BUN: 21 mg/dL (ref 8–23)
CO2: 23 mmol/L (ref 22–32)
Calcium: 8.8 mg/dL — ABNORMAL LOW (ref 8.9–10.3)
Chloride: 101 mmol/L (ref 98–111)
Creatinine: 1.03 mg/dL (ref 0.61–1.24)
GFR, Estimated: >60 mL/min (ref >60)
Glucose, Bld: 263 mg/dL — ABNORMAL HIGH (ref 70–99)
Potassium: 4.2 mmol/L (ref 3.5–5.1)
Sodium: 135 mmol/L (ref 135–145)
Total Bilirubin: 1 mg/dL (ref 0.0–1.2)
Total Protein: 6.4 g/dL — ABNORMAL LOW (ref 6.5–8.1)

## 2023-09-25 LAB — RAD ONC ARIA SESSION SUMMARY
Course Elapsed Days: 27
Plan Fractions Treated to Date: 17
Plan Prescribed Dose Per Fraction: 1.8 Gy
Plan Total Fractions Prescribed: 25
Plan Total Prescribed Dose: 45 Gy
Reference Point Dosage Given to Date: 30.6 Gy
Reference Point Session Dosage Given: 1.8 Gy
Session Number: 17

## 2023-09-25 MED ORDER — SODIUM CHLORIDE 0.9 % IV SOLN: INTRAVENOUS | Status: DC

## 2023-09-25 MED ORDER — SODIUM CHLORIDE 0.9 % IV SOLN
500.0000 mg/m2 | Freq: Once | INTRAVENOUS | Status: AC
  Administered 2023-09-25: 912 mg via INTRAVENOUS
  Filled 2023-09-25: qty 23.99

## 2023-09-25 MED ORDER — PROCHLORPERAZINE MALEATE 10 MG PO TABS
10.0000 mg | ORAL_TABLET | Freq: Once | ORAL | Status: AC
  Administered 2023-09-25: 10 mg via ORAL
  Filled 2023-09-25: qty 1

## 2023-09-25 NOTE — Progress Notes (Signed)
 Patient is tolerating his concurrent ChemoRT. He will continue with his next chemo cycle today.   Oncology Nurse Navigator Documentation     09/25/2023   10:45 AM  Oncology Nurse Navigator Flowsheets  Phase of Treatment Chemo/Radiation Concurrent  Chemo/Radiation Concurrent Expected End Date: 10/10/2023  Navigator Follow Up Date: 10/09/2023  Navigator Follow Up Reason: Follow-up Appointment;Chemotherapy  Navigator Location [facility]  Navigator Encounter Type Treatment  Patient Visit Type MedOnc  Treatment Phase Active Tx  Barriers/Navigation Needs Coordination of Care  Interventions Psycho-Social Support  Acuity Level 2-Minimal Needs (1-2 Barriers Identified)  Support Groups/Services Friends and Family  Time Spent with Patient 15

## 2023-09-25 NOTE — Progress Notes (Signed)
 Hematology and Oncology Follow Up Visit  HARSHAN KEARLEY [id number] 02/27/1940 83 y.o. 09/25/2023   Principle Diagnosis:  Adenocarcinoma of the pancreas-stage Ib (T2N0M0) --clinical stage --BRCA (-), KRAS (+), HRD(-) --metastatic to lung   Current Therapy:        Status post biliary stent placement FOLFIRINOX -- Neoadj - s/p cycle #12/12  - start on 10/31/2022 --completed on 05/08/2023 XRT + Gemzar  -- start on 08/27/2023   Interim History:  Mr. Mckellips is here today for follow-up.  He is currently on radiation and chemotherapy.   Today he states that he has been doing much better.  He has had no issues with nausea or vomiting.  Has had no change in bowel or bladder habits.  He has had no bleeding.  Is been no rashes.  He has had no leg swelling.  He has had no mouth sores.  He is eating and drinking well.   Overall, I would have to say that his performance status is [name].   Wt Readings from Last 3 Encounters:  09/25/23 149 lb 6.4 oz (67.8 kg)  09/20/23 150 lb (68 kg)  09/15/23 155 lb (70.3 kg)   Medications:  Allergies as of 09/25/2023       Reactions   Metformin  And Related Diarrhea   Cheese Diarrhea   Allergy to soft cheese, can have hard cheese   Milk-related Compounds Diarrhea        Medication List        Accurate as of September 25, 2023 10:53 AM. If you have any questions, ask your nurse or doctor.          Accu-Chek Softclix Lancets lancets Use to test blood glucose once daily   amLODipine  10 MG tablet Commonly known as: NORVASC  Take 1 tablet (10 mg total) by mouth daily.   Droplet Pen Needles 31G X 6 MM Misc Generic drug: Insulin  Pen Needle Use to check blood sugar TID   BD Pen Needle Nano 2nd Gen 32G X 4 MM Misc Generic drug: Insulin  Pen Needle   Embrace Pro Glucose Test test strip Generic drug: glucose blood Use as instructed   FreeStyle Libre 3 Plus Sensor Misc Change sensor every 15 days.   Lantus  SoloStar 100 UNIT/ML Solostar  Pen Generic drug: insulin  glargine INJECT 20 UNITS INTO THE SKIN 2 TIMES A DAY What changed:  how much to take how to take this when to take this additional instructions   lipase/protease/amylase 63999 UNITS Cpep capsule Commonly known as: Creon  Take 2 capsules (72,000 Units total) by mouth 3 (three) times daily before meals.   NovoLOG  FlexPen 100 UNIT/ML FlexPen Generic drug: insulin  aspart 2-3 units with meals 3 times a day plus sliding scale as instructed, maximum 20units/day   ondansetron  8 MG tablet Commonly known as: Zofran  Take 1 tablet (8 mg total) by mouth every 8 (eight) hours as needed for nausea or vomiting.   pantoprazole  40 MG tablet Commonly known as: PROTONIX  Take 1 tablet (40 mg total) by mouth 3 (three) times daily.   prochlorperazine  10 MG tablet Commonly known as: COMPAZINE  Take 1 tablet (10 mg total) by mouth every 6 (six) hours as needed for nausea or vomiting.   saccharomyces boulardii 250 MG capsule Commonly known as: FLORASTOR Take 250 mg by mouth 2 (two) times daily.   vitamin D3 50 MCG (2000 UT) Caps Take 2,000 Units by mouth with breakfast, with lunch, and with evening meal.        Allergies:  Allergies  Allergen Reactions   Metformin  And Related Diarrhea   Cheese Diarrhea    Allergy to soft cheese, can have hard cheese   Milk-Related Compounds Diarrhea    Past Medical History, Surgical history, Social history, and Family History were reviewed and updated.  Review of Systems:  Review of Systems  Constitutional: Negative.   HENT: Negative.    Eyes: Negative.   Respiratory: Negative.    Cardiovascular: Negative.   Gastrointestinal: Negative.   Genitourinary: Negative.   Musculoskeletal: Negative.   Skin: Negative.   Neurological: Negative.   Endo/Heme/Allergies: Negative.   Psychiatric/Behavioral: Negative.       Physical Exam:  height is 5' 11 (1.803 m) and weight is 149 lb 6.4 oz (67.8 kg). His oral temperature is 97.7  F (36.5 C). His blood pressure is 108/70 and his pulse is 58 (abnormal). His respiration is 17 and oxygen saturation is 100%.   Wt Readings from Last 3 Encounters:  09/25/23 149 lb 6.4 oz (67.8 kg)  09/20/23 150 lb (68 kg)  09/15/23 155 lb (70.3 kg)    Physical Exam Vitals reviewed.  HENT:     Head: Normocephalic and atraumatic.  Eyes:     Pupils: Pupils are equal, round, and reactive to light.  Cardiovascular:     Rate and Rhythm: Normal rate and regular rhythm.     Heart sounds: Normal heart sounds.  Pulmonary:     Effort: Pulmonary effort is normal.     Breath sounds: Normal breath sounds.  Abdominal:     General: Bowel sounds are normal.     Palpations: Abdomen is soft.  Musculoskeletal:        General: No tenderness or deformity. Normal range of motion.     Cervical back: Normal range of motion.     Comments: Chest wall exam shows he dressed VATS and thoracotomy site on the right lateral chest wall.  There is little bit of swelling.  There is little bit of tenderness at the sites.  Lymphadenopathy:     Cervical: No cervical adenopathy.  Skin:    General: Skin is warm and dry.     Findings: No erythema or rash.  Neurological:     Mental Status: He is alert and oriented to person, place, and time.  Psychiatric:        Behavior: Behavior normal.        Thought Content: Thought content normal.        Judgment: Judgment normal.      Lab Results  Component Value Date   WBC 9.2 09/25/2023   HGB 11.3 (L) 09/25/2023   HCT 33.2 (L) 09/25/2023   MCV 94.1 09/25/2023   PLT 316 09/25/2023   Lab Results  Component Value Date   FERRITIN 91 06/05/2023   IRON 78 06/05/2023   TIBC 298 06/05/2023   UIBC 220 06/05/2023   IRONPCTSAT 26 06/05/2023   Lab Results  Component Value Date   RETICCTPCT 0.7 08/30/2022   RBC 3.53 (L) 09/25/2023   RETICCTABS 32.6 12/19/2013   No results found for: KPAFRELGTCHN, LAMBDASER, KAPLAMBRATIO No results found for: IGGSERUM,  IGA, IGMSERUM No results found for: STEPHANY RINGS, A1GS, A2GS, EARLA JOANNIE DOC VICK, SPEI   Chemistry      Component Value Date/Time   NA 135 09/25/2023 1006   NA 144 11/29/2016 1054   NA 139 12/29/2015 0954   K 4.2 09/25/2023 1006   K 4.2 11/29/2016 1054   K 4.1 12/29/2015 0954  CL 101 09/25/2023 1006   CL 102 11/29/2016 1054   CO2 23 09/25/2023 1006   CO2 30 11/29/2016 1054   CO2 26 12/29/2015 0954   BUN 21 09/25/2023 1006   BUN 22 11/29/2016 1054   BUN 20.0 12/29/2015 0954   CREATININE 1.03 09/25/2023 1006   CREATININE 1.2 11/29/2016 1054   CREATININE 1.3 12/29/2015 0954      Component Value Date/Time   CALCIUM  8.8 (L) 09/25/2023 1006   CALCIUM  9.3 11/29/2016 1054   CALCIUM  9.5 12/29/2015 0954   ALKPHOS 254 (H) 09/25/2023 1006   ALKPHOS 92 (H) 11/29/2016 1054   ALKPHOS 98 12/29/2015 0954   AST 22 09/25/2023 1006   AST 16 12/29/2015 0954   ALT 40 09/25/2023 1006   ALT 20 11/29/2016 1054   ALT 13 12/29/2015 0954   BILITOT 1.0 09/25/2023 1006   BILITOT 0.71 12/29/2015 0954      Encounter Diagnosis  Name Primary?   Malignant neoplasm of tail of pancreas (HCC) Yes    Impression and Plan:  Mr. Loewe is a pleasant 83 yo gentleman with original diagnosis of  polycythemia vera and early stage pancreatic cancer. He was originally treated with neoadjuvant FOLFIRINOX but unfortunately he was not had progressive disease.  As such, he was not able to have surgery to resect out the pancreatic mass. We are now doing chemoradiation therapy.   RTC weekly x 3 for labs and TX RTC 3 weeks MD, port labs, Treatment    Lauraine HERO [place], [place] 9/23/202510:53 AM

## 2023-09-25 NOTE — Patient Instructions (Signed)
 [facility] - A DEPT OF [facility]  Discharge Instructions: Thank you for choosing [facility] to provide your oncology and hematology care.   If you have a lab appointment with the Cancer Center, please go directly to the Cancer Center and check in at the registration area.  Wear comfortable clothing and clothing appropriate for easy access to any Portacath or PICC line.   We strive to give you quality time with your provider. You may need to reschedule your appointment if you arrive late (15 or more minutes).  Arriving late affects you and other patients whose appointments are after yours.  Also, if you miss three or more appointments without notifying the office, you may be dismissed from the clinic at the provider's discretion.      For prescription refill requests, have your pharmacy contact our office and allow 72 hours for refills to be completed.    Today you received the following chemotherapy and/or immunotherapy agents Gemzar.   To help prevent nausea and vomiting after your treatment, we encourage you to take your nausea medication as directed.  BELOW ARE SYMPTOMS THAT SHOULD BE REPORTED IMMEDIATELY: *FEVER GREATER THAN 100.4 F (38 C) OR HIGHER *CHILLS OR SWEATING *NAUSEA AND VOMITING THAT IS NOT CONTROLLED WITH YOUR NAUSEA MEDICATION *UNUSUAL SHORTNESS OF BREATH *UNUSUAL BRUISING OR BLEEDING *URINARY PROBLEMS (pain or burning when urinating, or frequent urination) *BOWEL PROBLEMS (unusual diarrhea, constipation, pain near the anus) TENDERNESS IN MOUTH AND THROAT WITH OR WITHOUT PRESENCE OF ULCERS (sore throat, sores in mouth, or a toothache) UNUSUAL RASH, SWELLING OR PAIN  UNUSUAL VAGINAL DISCHARGE OR ITCHING   Items with * indicate a potential emergency and should be followed up as soon as possible or go to the Emergency Department if any problems should occur.  Please show the CHEMOTHERAPY ALERT CARD or IMMUNOTHERAPY ALERT  CARD at check-in to the Emergency Department and triage nurse. Should you have questions after your visit or need to cancel or reschedule your appointment, please contact [facility] - A DEPT OF [facility]  [phone] and follow the prompts.  Office hours are 8:00 a.m. to 4:30 p.m. Monday - Friday. Please note that voicemails left after 4:00 p.m. may not be returned until the following business day.  We are closed weekends and major holidays. You have access to a nurse at all times for urgent questions. Please call the main number to the clinic [phone] and follow the prompts.  For any non-urgent questions, you may also contact your provider using MyChart. We now offer e-Visits for anyone 31 and older to request care online for non-urgent symptoms. For details visit [email or web address].   Also download the MyChart app! Go to the app store, search "MyChart", open the app, select , and log in with your MyChart username and password.

## 2023-09-26 ENCOUNTER — Encounter: Payer: Self-pay | Admitting: Adult Health

## 2023-09-26 ENCOUNTER — Other Ambulatory Visit: Payer: Self-pay

## 2023-09-26 ENCOUNTER — Ambulatory Visit: Admitting: Adult Health

## 2023-09-26 ENCOUNTER — Ambulatory Visit
Admission: RE | Admit: 2023-09-26 | Discharge: 2023-09-26 | Discharge status: Home / Self Care | Source: Ambulatory Visit | Attending: Radiation Oncology | Admitting: Radiation Oncology

## 2023-09-26 ENCOUNTER — Ambulatory Visit
Admission: RE | Admit: 2023-09-26 | Discharge: 2023-09-26 | Disposition: A | Discharge status: Home / Self Care | Source: Ambulatory Visit | Attending: Radiation Oncology | Admitting: Radiation Oncology

## 2023-09-26 ENCOUNTER — Inpatient Hospital Stay: Admitting: Adult Health

## 2023-09-26 VITALS — BP 100/60 | HR 78 | Temp 97.5°F | Ht 71.0 in | Wt 149.0 lb

## 2023-09-26 DIAGNOSIS — K047 Periapical abscess without sinus: Secondary | ICD-10-CM

## 2023-09-26 DIAGNOSIS — C250 Malignant neoplasm of head of pancreas: Secondary | ICD-10-CM | POA: Diagnosis not present

## 2023-09-26 DIAGNOSIS — K831 Obstruction of bile duct: Secondary | ICD-10-CM | POA: Diagnosis not present

## 2023-09-26 DIAGNOSIS — Z794 Long term (current) use of insulin: Secondary | ICD-10-CM

## 2023-09-26 DIAGNOSIS — E1169 Type 2 diabetes mellitus with other specified complication: Secondary | ICD-10-CM | POA: Diagnosis not present

## 2023-09-26 DIAGNOSIS — F321 Major depressive disorder, single episode, moderate: Secondary | ICD-10-CM

## 2023-09-26 LAB — RAD ONC ARIA SESSION SUMMARY
Course Elapsed Days: 28
Plan Fractions Treated to Date: 18
Plan Prescribed Dose Per Fraction: 1.8 Gy
Plan Total Fractions Prescribed: 25
Plan Total Prescribed Dose: 45 Gy
Reference Point Dosage Given to Date: 32.4 Gy
Reference Point Session Dosage Given: 1.8 Gy
Session Number: 18

## 2023-09-26 NOTE — Progress Notes (Signed)
 Subjective:    Patient ID: Alexander [name], male    DOB: 1940-12-19, 83 y.o.   MRN: [id number]  HPI 83 year old male who  has a past medical history of Arthritis, CAD (coronary artery disease), Diverticulosis of colon, GERD (gastroesophageal reflux disease), GI bleed, Gout, Hematuria, microscopic, Hemorrhoids, Hyperglycemia, Hyperlipidemia, Hypertension, Lumbar back pain, Microalbuminuria, Overweight(278.02), Pancreatic cancer (HCC) (09/22/2022), Polycythemia rubra vera (HCC), and Tubular adenoma of colon (07/2012).  He presents to the office today for TCM visit   Admit Date 09/15/2023 Discharge Date 09/18/2023  He presented to the emergency room with epigastric pain, nausea, vomiting, inability to keep down p.o. in the setting of recent chemoinfusion on 09/11/2023.  Hospital Course  Common Bile Duct Obstruction  - s/p ERCP on 09/17/2023 - Symptoms seem to have resolved after ERCP was performed to unobstruct common bile duct. - Follow-up suspected cholecystitis, was placed on a total 7 days of Augmentin  - Protonix  was increased to TID dosing   Metastatic pancreatic cancer  - Follow up outpatient with Oncology  - CT Abd/Pelvis noted to right lung base nodules ranging from 7 mm to 9 mm which were new and increased from last year suspicious for pulmonary metastatic disease  Recent Tooth infection  - Stopped Amoxil  - on augmentin   - Follow up with dentist outpatient  DM Type 2  - Currently on Lantus  16 units daily - BS well controlled during hospital admission   Depression  - Continued on Zyprexa  5 mg daily.   Today in the office he presents with his wife. He reports feeling well overall except for fatigue that he contributes to going through treatment. He has not had any fevers, chills, or vomiting. He has treatment later this afternoon.   Review of Systems  Constitutional:  Positive for fatigue.  HENT: Negative.    Respiratory: Negative.    Cardiovascular: Negative.    Gastrointestinal: Negative.   Genitourinary: Negative.   Musculoskeletal: Negative.   Skin: Negative.   Neurological: Negative.    Past Medical History:  Diagnosis Date   Arthritis    minor; shoulders primarily (05/20/2013)   CAD (coronary artery disease)    a. 05/2013 - Chest pain/unstable angina and dynamic EKG changes showing cath showing atherosclerotic coronary artery disease manifested as diffuse ectasia, normal EF.   Diverticulosis of colon    GERD (gastroesophageal reflux disease)    GI bleed    secondary to Aspirin    Gout    Hematuria, microscopic    Hemorrhoids    Hyperglycemia    Hyperlipidemia    Hypertension    Lumbar back pain    Microalbuminuria    Overweight(278.02)    Pancreatic cancer (HCC) 09/22/2022   Polycythemia rubra vera (HCC)    Tubular adenoma of colon 07/2012    Social History   Socioeconomic History   Marital status: Married    Spouse name: Not on file   Number of children: 3   Years of education: Not on file   Highest education level: Some college, no degree  Occupational History   Occupation: retired Garment/textile technologist: RETIRED  Tobacco Use   Smoking status: Never   Smokeless tobacco: Never   Tobacco comments:    NEVER USED TOBACCO  Vaping Use   Vaping status: Never Used  Substance and Sexual Activity   Alcohol use: No    Alcohol/week: 0.0 standard drinks of alcohol   Drug use: No   Sexual activity: Not Currently  Partners: Female  Other Topics Concern   Not on file  Social History Narrative   Marriedfor 52 years    Daughter, Physicist, medical (pediatrician--lives in [place], [place]), one in [place] - Warden/ranger. One in [place] - Chartered loss adjuster          Social Drivers of Health   Financial Resource Strain: Low Risk  (08/11/2023)   Overall Financial Resource Strain (CARDIA)    Difficulty of Paying Living Expenses: Not hard at all  Food Insecurity: No Food Insecurity (09/20/2023)   Hunger Vital Sign    Worried About Running Out of Food in  the Last Year: Never true    Ran Out of Food in the Last Year: Never true  Transportation Needs: No Transportation Needs (09/20/2023)   PRAPARE - Administrator, Civil Service (Medical): No    Lack of Transportation (Non-Medical): No  Physical Activity: Sufficiently Active (08/11/2023)   Exercise Vital Sign    Days of Exercise per Week: 7 days    Minutes of Exercise per Session: 90 min  Stress: No Stress Concern Present (08/11/2023)   [employer] of Occupational Health - Occupational Stress Questionnaire    Feeling of Stress: Not at all  Social Connections: Socially Integrated (09/15/2023)   Social Connection and Isolation Panel    Frequency of Communication with Friends and Family: More than three times a week    Frequency of Social Gatherings with Friends and Family: More than three times a week    Attends Religious Services: More than 4 times per year    Active Member of Clubs or Organizations: Not on file    Attends Banker Meetings: More than 4 times per year    Marital Status: Married  Catering manager Violence: Not At Risk (09/20/2023)   Humiliation, Afraid, Rape, and Kick questionnaire    Fear of Current or Ex-Partner: No    Emotionally Abused: No    Physically Abused: No    Sexually Abused: No    Past Surgical History:  Procedure Laterality Date   BILIARY STENT PLACEMENT N/A 09/02/2022   Procedure: BILIARY STENT PLACEMENT;  Surgeon: Abran Norleen SAILOR, MD;  Location: [facility] ENDOSCOPY;  Service: Gastroenterology;  Laterality: N/A;   BILIARY STENT PLACEMENT  10/13/2022   Procedure: BILIARY STENT PLACEMENT;  Surgeon: Aneita Gwendlyn DASEN, MD;  Location: [facility];  Service: Gastroenterology;;   BIOPSY  09/11/2022   Procedure: BIOPSY;  Surgeon: Wilhelmenia Aloha Raddle., MD;  Location: [facility] ENDOSCOPY;  Service: Gastroenterology;;   CARDIAC CATHETERIZATION  05/20/2013   CATARACT EXTRACTION W/ INTRAOCULAR LENS  IMPLANT, BILATERAL Bilateral 2008   CYSTECTOMY  ~ 2000     SEBACEOUS cyst removed from between shoulder blades   ENDOSCOPIC RETROGRADE CHOLANGIOPANCREATOGRAPHY (ERCP) WITH PROPOFOL  N/A 09/02/2022   Procedure: ENDOSCOPIC RETROGRADE CHOLANGIOPANCREATOGRAPHY (ERCP) WITH PROPOFOL ;  Surgeon: Abran Norleen SAILOR, MD;  Location: [facility];  Service: Gastroenterology;  Laterality: N/A;   ENDOSCOPIC RETROGRADE CHOLANGIOPANCREATOGRAPHY (ERCP) WITH PROPOFOL  N/A 10/13/2022   Procedure: ENDOSCOPIC RETROGRADE CHOLANGIOPANCREATOGRAPHY (ERCP) WITH PROPOFOL ;  Surgeon: Aneita Gwendlyn DASEN, MD;  Location: [facility] ENDOSCOPY;  Service: Gastroenterology;  Laterality: N/A;   ERCP N/A 09/17/2023   Procedure: ERCP, WITH INTERVENTION IF INDICATED;  Surgeon: Wilhelmenia Aloha Raddle., MD;  Location: [facility] ENDOSCOPY;  Service: Gastroenterology;  Laterality: N/A;   ESOPHAGOGASTRODUODENOSCOPY N/A 09/11/2022   Procedure: ESOPHAGOGASTRODUODENOSCOPY (EGD);  Surgeon: Wilhelmenia Aloha Raddle., MD;  Location: [facility];  Service: Gastroenterology;  Laterality: N/A;   EUS N/A 09/11/2022   Procedure: UPPER ENDOSCOPIC ULTRASOUND (EUS) RADIAL;  Surgeon: Wilhelmenia Aloha Raddle., MD;  Location: [facility];  Service: Gastroenterology;  Laterality: N/A;   EXCISIONAL HEMORRHOIDECTOMY  1970's   FINE NEEDLE ASPIRATION N/A 09/11/2022   Procedure: FINE NEEDLE ASPIRATION (FNA) LINEAR;  Surgeon: Wilhelmenia Aloha Raddle., MD;  Location: [facility] ENDOSCOPY;  Service: Gastroenterology;  Laterality: N/A;   INGUINAL HERNIA REPAIR Right ~ 2010   IR IMAGING GUIDED PORT INSERTION  10/27/2022   LEFT HEART CATHETERIZATION WITH CORONARY ANGIOGRAM N/A 05/20/2013   Procedure: LEFT HEART CATHETERIZATION WITH CORONARY ANGIOGRAM;  Surgeon: Peter M Swaziland, MD;  Location: [facility] CATH LAB;  Service: Cardiovascular;  Laterality: N/A;   SPHINCTEROTOMY  09/02/2022   Procedure: SPHINCTEROTOMY;  Surgeon: Abran Norleen SAILOR, MD;  Location: [facility];  Service: Gastroenterology;;   CLEDA REMOVAL  10/13/2022   Procedure: STENT REMOVAL;  Surgeon: Aneita Gwendlyn DASEN,  MD;  Location: [facility] ENDOSCOPY;  Service: Gastroenterology;;   TONSILLECTOMY AND ADENOIDECTOMY  1940's   VASECTOMY      Family History  Problem Relation Age of Onset   Colon cancer Mother        died at 67, colorectal   Diabetes Mother    Heart disease Father 4       MI   Diabetes Father    Stomach cancer Maternal Grandfather    Cancer Maternal Uncle     Allergies  Allergen Reactions   Metformin  And Related Diarrhea   Cheese Diarrhea    Allergy to soft cheese, can have hard cheese   Milk-Related Compounds Diarrhea    Current Outpatient Medications on File Prior to Visit  Medication Sig Dispense Refill   Accu-Chek Softclix Lancets lancets Use to test blood glucose once daily 100 each 3   amLODipine  (NORVASC ) 10 MG tablet Take 1 tablet (10 mg total) by mouth daily. 90 tablet 3   BD PEN NEEDLE NANO 2ND GEN 32G X 4 MM MISC      Cholecalciferol (VITAMIN D3) 50 MCG (2000 UT) CAPS Take 2,000 Units by mouth with breakfast, with lunch, and with evening meal.     Continuous Glucose Sensor (FREESTYLE LIBRE 3 PLUS SENSOR) MISC Change sensor every 15 days. 2 each 6   glucose blood (EMBRACE PRO GLUCOSE TEST) test strip Use as instructed 100 each 12   insulin  aspart (NOVOLOG  FLEXPEN) 100 UNIT/ML FlexPen 2-3 units with meals 3 times a day plus sliding scale as instructed, maximum 20units/day 15 mL 11   insulin  glargine (LANTUS  SOLOSTAR) 100 UNIT/ML Solostar Pen INJECT 20 UNITS INTO THE SKIN 2 TIMES A DAY (Patient taking differently: Inject 16 Units into the skin daily. INJECT 16 UNITS INTO THE SKIN daily) 12 mL 0   Insulin  Pen Needle (DROPLET PEN NEEDLES) 31G X 6 MM MISC Use to check blood sugar TID 100 each 0   lipase/protease/amylase (CREON ) 36000 UNITS CPEP capsule Take 2 capsules (72,000 Units total) by mouth 3 (three) times daily before meals. (Patient taking differently: Take 2 capsules (72,000 Units total) by mouth 3 (three) times daily before meals.)     ondansetron  (ZOFRAN ) 8 MG tablet  Take 1 tablet (8 mg total) by mouth every 8 (eight) hours as needed for nausea or vomiting. 30 tablet 1   pantoprazole  (PROTONIX ) 40 MG tablet Take 1 tablet (40 mg total) by mouth 3 (three) times daily.     prochlorperazine  (COMPAZINE ) 10 MG tablet Take 1 tablet (10 mg total) by mouth every 6 (six) hours as needed for nausea or vomiting.     saccharomyces boulardii (FLORASTOR) 250  MG capsule Take 250 mg by mouth 2 (two) times daily.     No current facility-administered medications on file prior to visit.    BP 100/60   Pulse 78   Temp (!) 97.5 F (36.4 C) (Tympanic)   Ht 5' 11 (1.803 m)   Wt 149 lb (67.6 kg)   SpO2 95%   BMI 20.78 kg/m       Objective:   Physical Exam Vitals and nursing note reviewed.  Constitutional:      Appearance: Normal appearance.  Cardiovascular:     Rate and Rhythm: Normal rate and regular rhythm.     Pulses: Normal pulses.     Heart sounds: Normal heart sounds.  Pulmonary:     Effort: Pulmonary effort is normal.     Breath sounds: Normal breath sounds.  Musculoskeletal:        General: Normal range of motion.  Skin:    General: Skin is warm and dry.  Neurological:     General: No focal deficit present.     Mental Status: He is alert and oriented to person, place, and time.  Psychiatric:        Mood and Affect: Mood normal.        Behavior: Behavior normal.        Thought Content: Thought content normal.        Judgment: Judgment normal.           Assessment & Plan:  1. Common bile duct obstruction (Primary) - Reviewed hospital notes, discharge instructions, labs, imaging and medication changes with the patient. All questions answered to the best of my ability   2. Malignant neoplasm of head of pancreas [facility]) - Per oncology   3. Tooth infection - Follow up with dentist outpatient   4. Type 2 diabetes mellitus with other specified complication, with long-term current use of insulin  (HCC) - Continue with current dose of insulin   -  Follow up with endocrinology as directed  5. Depression, major, single episode, moderate (HCC) - Controlled. Continue with Zyprexa  5 mg daily   Valeska Haislip, NP

## 2023-09-27 ENCOUNTER — Other Ambulatory Visit: Payer: Self-pay

## 2023-09-27 ENCOUNTER — Ambulatory Visit
Admission: RE | Admit: 2023-09-27 | Discharge: 2023-09-27 | Disposition: A | Discharge status: Home / Self Care | Source: Ambulatory Visit | Attending: Radiation Oncology | Admitting: Radiation Oncology

## 2023-09-27 DIAGNOSIS — C250 Malignant neoplasm of head of pancreas: Secondary | ICD-10-CM | POA: Diagnosis not present

## 2023-09-27 LAB — RAD ONC ARIA SESSION SUMMARY
Course Elapsed Days: 29
Plan Fractions Treated to Date: 19
Plan Prescribed Dose Per Fraction: 1.8 Gy
Plan Total Fractions Prescribed: 25
Plan Total Prescribed Dose: 45 Gy
Reference Point Dosage Given to Date: 34.2 Gy
Reference Point Session Dosage Given: 1.8 Gy
Session Number: 19

## 2023-09-27 NOTE — Patient Instructions (Signed)
 Visit Information  Thank you for taking time to visit with me today. Please don't hesitate to contact me if I can be of assistance to you before our next scheduled telephone appointment.  Our next appointment is by telephone on 10/04/2023 at 11am  Following is a copy of your care plan:   Goals Addressed             This Visit's Progress    [facility] Transitions of Care (TOC) Care Plan       Problems:  Recent Hospitalization for treatment of pancreatic cancer, ERCP  09/27/2023  Patient reports that he woke up in severe pain today. Mostly shoulder and thumbs.  States that he took a tylenol  and pain is decreasing.  Reports that this is the first time he has had pain like this since having a round of chemo.  Denies fever or any other symptoms. Going to radiation today.    Goal:  Over the next 30 days, the patient will not experience hospital readmission  Interventions:  Transitions of Care: Doctor Visits  - discussed the importance of doctor visits Reviewed Signs and symptoms of infection Reviewed PCP follow up yesterday.  Reviewed current weight Reviewed change in taste with cancer treatment- reviewed that patient is hungry but food does not taste good. Encouraged patient to continue to try to eat and drink as much as possible.  Encouraged patient to stay as active as possible. Reviewed pain management.  Encouraged patient to talk with cancer today when he goes for a treatment about new symptoms today.  Oncology: Assessment of understanding of oncology diagnosis:  Assessed patient understanding of cancer diagnosis and recommended treatment plan Reviewed upcoming provider appointments and treatment appointments Encouraged patient to again continue to eat the best that he can  Patient Self Care Activities:  Attend all scheduled provider appointments Call pharmacy for medication refills 3-7 days in advance of running out of medications Call provider office for new concerns or questions   Take medications as prescribed   See MD as planned Take pain medications as needed Check your temperature Discuss new symptoms at cancer today when you go to your appointment. Rest when you can  Plan:  Telephone follow up appointment with care management team member scheduled for:  10/04/2023 at 11 am The patient has been provided with contact information for the care management team and has been advised to call with any health related questions or concerns.         Patient verbalizes understanding of instructions and care plan provided today and agrees to view in MyChart. Active MyChart status and patient understanding of how to access instructions and care plan via MyChart confirmed with patient.     Telephone follow up appointment with care management team member scheduled for:  10/04/2023 at 11 am  Please call the care guide team at [phone] if you need to cancel or reschedule your appointment.   Please call the Suicide and Crisis Lifeline: 988 call the [place]  National Suicide Prevention Lifeline: 365 273 5943 or TTY: 412-594-4956 TTY 364-472-9962) to talk to a trained counselor call 1-800-273-TALK (toll free, 24 hour hotline) call 911 if you are experiencing a Mental Health or Behavioral Health Crisis or need someone to talk to.  Alan Ee, RN, BSN, CEN [employer]- Transition of Care Team.  [facility] [phone]

## 2023-09-27 NOTE — Transitions of Care (Post Inpatient/ED Visit) (Signed)
 Transition of Care week 2  Visit Note  09/27/2023  Name: Alexander [name] MRN: [id number]          DOB: 06-03-1940  Situation: Patient enrolled in [facility] 30-day program. Visit completed with patient by telephone.   Background:   Initial Transition Care Management Follow-up Telephone Call    Past Medical History:  Diagnosis Date   Arthritis    minor; shoulders primarily (05/20/2013)   CAD (coronary artery disease)    a. 05/2013 - Chest pain/unstable angina and dynamic EKG changes showing cath showing atherosclerotic coronary artery disease manifested as diffuse ectasia, normal EF.   Diverticulosis of colon    GERD (gastroesophageal reflux disease)    GI bleed    secondary to Aspirin    Gout    Hematuria, microscopic    Hemorrhoids    Hyperglycemia    Hyperlipidemia    Hypertension    Lumbar back pain    Microalbuminuria    Overweight(278.02)    Pancreatic cancer (HCC) 09/22/2022   Polycythemia rubra vera (HCC)    Tubular adenoma of colon 07/2012    Assessment: Patient reports severe pain when he woke up this am. This is unusual for him. Took tylenol  for pain. Pain is mostly shoulder and thumbs. Pain worse with movement.  Denies any signs of infection.  Patient Reported Symptoms: Cognitive Cognitive Status: Able to follow simple commands, Alert and oriented to person, place, and time, Normal speech and language skills      Neurological Neurological Review of Symptoms: No symptoms reported    HEENT HEENT Symptoms Reported: No symptoms reported      Cardiovascular Cardiovascular Symptoms Reported: No symptoms reported    Respiratory Respiratory Symptoms Reported: No symptoms reported    Endocrine Endocrine Symptoms Reported: No symptoms reported Is patient diabetic?: Yes Is patient checking blood sugars at home?: Yes List most recent blood sugar readings, include date and time of day: 104 during call    Gastrointestinal Gastrointestinal Symptoms Reported: No  symptoms reported Additional Gastrointestinal Details: reports being hungry.  making himself eating Gastrointestinal Self-Management Outcome: 4 (good)    Genitourinary Genitourinary Symptoms Reported: No symptoms reported    Integumentary Integumentary Symptoms Reported: No symptoms reported    Musculoskeletal Musculoskelatal Symptoms Reviewed: Joint pain Additional Musculoskeletal Details: shoulder and thumb pain with movement.        Psychosocial Psychosocial Symptoms Reported: No symptoms reported         There were no vitals filed for this visit.   Medications Reviewed Today     Reviewed by Rumalda Alan PENNER, RN (Registered Nurse) on 09/27/23 at 1129  Med List Status: <None>   Medication Order Taking? Sig Documenting Provider Last Dose Status Informant  Accu-Chek Softclix Lancets lancets [id number] Yes Use to test blood glucose once daily Nafziger, Darleene, NP  Active Self, Pharmacy Records  amLODipine  (NORVASC ) 10 MG tablet [id number] Yes Take 1 tablet (10 mg total) by mouth daily. Nafziger, Darleene, NP  Active Self, Pharmacy Records  BD PEN NEEDLE NANO 2ND GEN 32G X 4 MM MISC [id number] Yes  [provider]  Active Self, Pharmacy Records  Cholecalciferol (VITAMIN D3) 50 MCG (2000 UT) CAPS [id number] Yes Take 2,000 Units by mouth with breakfast, with lunch, and with evening meal. [provider]  Active Self, Pharmacy Records  Continuous Glucose Sensor (FREESTYLE LIBRE 3 PLUS SENSOR) MISC [id number] Yes Change sensor every 15 days. Merna Darleene, NP  Active Self, Pharmacy Records  glucose blood (EMBRACE PRO GLUCOSE TEST) test  strip [id number] Yes Use as instructed Nafziger, Cory, NP  Active Self, Pharmacy Records  insulin  aspart (NOVOLOG  FLEXPEN) 100 UNIT/ML FlexPen [id number] Yes 2-3 units with meals 3 times a day plus sliding scale as instructed, maximum 20units/day Thapa, [place], MD  Active Self, Pharmacy Records  insulin  glargine (LANTUS  SOLOSTAR) 100 UNIT/ML Solostar Pen  [id number] Yes INJECT 20 UNITS INTO THE SKIN 2 TIMES A DAY  Patient taking differently: Inject 16 Units into the skin daily. INJECT 16 UNITS INTO THE SKIN daily   Nafziger, Darleene, NP  Active Self, Pharmacy Records           Med Note RENNIS, DUROJAHYE' R   Sat Sep 15, 2023  9:56 AM) Changes on days where he has chemo  Insulin  Pen Needle (DROPLET PEN NEEDLES) 31G X 6 MM MISC [id number] Yes Use to check blood sugar TID Nafziger, Cory, NP  Active Self, Pharmacy Records  lipase/protease/amylase (CREON ) 36000 UNITS CPEP capsule [id number] Yes Take 2 capsules (72,000 Units total) by mouth 3 (three) times daily before meals.  Patient taking differently: Take 2 capsules (72,000 Units total) by mouth 3 (three) times daily before meals.   Samtani, Jai-Gurmukh, MD  Active   ondansetron  (ZOFRAN ) 8 MG tablet [id number] Yes Take 1 tablet (8 mg total) by mouth every 8 (eight) hours as needed for nausea or vomiting. Samtani, Jai-Gurmukh, MD  Active   pantoprazole  (PROTONIX ) 40 MG tablet [id number] Yes Take 1 tablet (40 mg total) by mouth 3 (three) times daily. Samtani, Jai-Gurmukh, MD  Active   prochlorperazine  (COMPAZINE ) 10 MG tablet [id number] Yes Take 1 tablet (10 mg total) by mouth every 6 (six) hours as needed for nausea or vomiting. Samtani, Jai-Gurmukh, MD  Active   saccharomyces boulardii (FLORASTOR) 250 MG capsule [id number] Yes Take 250 mg by mouth 2 (two) times daily. [provider]  Active Self, Pharmacy Records            Goals Addressed             This Visit's Progress    [facility] Transitions of Care (TOC) Care Plan       Problems:  Recent Hospitalization for treatment of pancreatic cancer, ERCP  09/27/2023  Patient reports that he woke up in severe pain today. Mostly shoulder and thumbs.  States that he took a tylenol  and pain is decreasing.  Reports that this is the first time he has had pain like this since having a round of chemo.  Denies fever or any other symptoms. Going to  radiation today.    Goal:  Over the next 30 days, the patient will not experience hospital readmission  Interventions:  Transitions of Care: Doctor Visits  - discussed the importance of doctor visits Reviewed Signs and symptoms of infection Reviewed PCP follow up yesterday.  Reviewed current weight Reviewed change in taste with cancer treatment- reviewed that patient is hungry but food does not taste good. Encouraged patient to continue to try to eat and drink as much as possible.  Encouraged patient to stay as active as possible. Reviewed pain management.  Encouraged patient to talk with cancer today when he goes for a treatment about new symptoms today.  Oncology: Assessment of understanding of oncology diagnosis:  Assessed patient understanding of cancer diagnosis and recommended treatment plan Reviewed upcoming provider appointments and treatment appointments Encouraged patient to again continue to eat the best that he can  Patient Self Care Activities:  Attend all scheduled provider appointments Call pharmacy for medication refills 3-7  days in advance of running out of medications Call provider office for new concerns or questions  Take medications as prescribed   See MD as planned Take pain medications as needed Check your temperature Discuss new symptoms at cancer today when you go to your appointment. Rest when you can  Plan:  Telephone follow up appointment with care management team member scheduled for:  10/04/2023 at 11 am The patient has been provided with contact information for the care management team and has been advised to call with any health related questions or concerns.         Recommendation:   Continue Current Plan of Care Discuss pain when you go to the cancer center today  Follow Up Plan:   Telephone follow up appointment date/time:  10/04/2023 at 11 am  Alan Ee, RN, BSN, [employer]- Transition of Care Team.  [facility] [employer] [phone]

## 2023-09-28 ENCOUNTER — Other Ambulatory Visit: Payer: Self-pay

## 2023-09-28 ENCOUNTER — Ambulatory Visit
Admission: RE | Admit: 2023-09-28 | Discharge: 2023-09-28 | Disposition: A | Discharge status: Home / Self Care | Source: Ambulatory Visit | Attending: Radiation Oncology | Admitting: Radiation Oncology

## 2023-09-28 DIAGNOSIS — C250 Malignant neoplasm of head of pancreas: Secondary | ICD-10-CM | POA: Diagnosis not present

## 2023-09-28 LAB — RAD ONC ARIA SESSION SUMMARY
Course Elapsed Days: 30
Plan Fractions Treated to Date: 20
Plan Prescribed Dose Per Fraction: 1.8 Gy
Plan Total Fractions Prescribed: 25
Plan Total Prescribed Dose: 45 Gy
Reference Point Dosage Given to Date: 36 Gy
Reference Point Session Dosage Given: 1.8 Gy
Session Number: 20

## 2023-10-01 ENCOUNTER — Ambulatory Visit
Admission: RE | Admit: 2023-10-01 | Discharge: 2023-10-01 | Disposition: A | Discharge status: Home / Self Care | Source: Ambulatory Visit | Attending: Radiation Oncology | Admitting: Radiation Oncology

## 2023-10-01 ENCOUNTER — Other Ambulatory Visit: Payer: Self-pay

## 2023-10-01 DIAGNOSIS — C250 Malignant neoplasm of head of pancreas: Secondary | ICD-10-CM | POA: Diagnosis not present

## 2023-10-01 LAB — RAD ONC ARIA SESSION SUMMARY
Course Elapsed Days: 33
Plan Fractions Treated to Date: 21
Plan Prescribed Dose Per Fraction: 1.8 Gy
Plan Total Fractions Prescribed: 25
Plan Total Prescribed Dose: 45 Gy
Reference Point Dosage Given to Date: 37.8 Gy
Reference Point Session Dosage Given: 1.8 Gy
Session Number: 21

## 2023-10-02 ENCOUNTER — Encounter: Payer: Self-pay | Admitting: *Deleted

## 2023-10-02 ENCOUNTER — Telehealth: Payer: Self-pay | Admitting: Dietician

## 2023-10-02 ENCOUNTER — Ambulatory Visit

## 2023-10-02 ENCOUNTER — Inpatient Hospital Stay

## 2023-10-02 ENCOUNTER — Encounter: Payer: Self-pay | Admitting: Medical Oncology

## 2023-10-02 ENCOUNTER — Other Ambulatory Visit: Payer: Self-pay | Admitting: Medical Oncology

## 2023-10-02 ENCOUNTER — Inpatient Hospital Stay: Admitting: Medical Oncology

## 2023-10-02 VITALS — BP 116/78 | HR 63 | Temp 98.3°F | Resp 18 | Ht 71.0 in | Wt 146.3 lb

## 2023-10-02 VITALS — BP 121/81 | HR 52 | Resp 17

## 2023-10-02 DIAGNOSIS — C252 Malignant neoplasm of tail of pancreas: Secondary | ICD-10-CM

## 2023-10-02 DIAGNOSIS — D649 Anemia, unspecified: Secondary | ICD-10-CM | POA: Diagnosis not present

## 2023-10-02 DIAGNOSIS — Z5111 Encounter for antineoplastic chemotherapy: Secondary | ICD-10-CM | POA: Diagnosis not present

## 2023-10-02 LAB — CBC WITH DIFFERENTIAL (CANCER CENTER ONLY)
Abs Immature Granulocytes: 0.06 K/uL (ref 0.00–0.07)
Basophils Absolute: 0 K/uL (ref 0.0–0.1)
Basophils Relative: 0 %
Eosinophils Absolute: 0.1 K/uL (ref 0.0–0.5)
Eosinophils Relative: 1 %
HCT: 34.7 % — ABNORMAL LOW (ref 39.0–52.0)
Hemoglobin: 11.6 g/dL — ABNORMAL LOW (ref 13.0–17.0)
Immature Granulocytes: 1 %
Lymphocytes Relative: 7 %
Lymphs Abs: 0.4 K/uL — ABNORMAL LOW (ref 0.7–4.0)
MCH: 31.4 pg (ref 26.0–34.0)
MCHC: 33.4 g/dL (ref 30.0–36.0)
MCV: 94 fL (ref 80.0–100.0)
Monocytes Absolute: 1.1 K/uL — ABNORMAL HIGH (ref 0.1–1.0)
Monocytes Relative: 18 %
Neutro Abs: 4.5 K/uL (ref 1.7–7.7)
Neutrophils Relative %: 73 %
Platelet Count: 198 K/uL (ref 150–400)
RBC: 3.69 MIL/uL — ABNORMAL LOW (ref 4.22–5.81)
RDW: 15.2 % (ref 11.5–15.5)
WBC Count: 6.1 K/uL (ref 4.0–10.5)
nRBC: 0 % (ref 0.0–0.2)

## 2023-10-02 LAB — CMP (CANCER CENTER ONLY)
ALT: 24 U/L (ref 0–44)
AST: 24 U/L (ref 15–41)
Albumin: 3.8 g/dL (ref 3.5–5.0)
Alkaline Phosphatase: 201 U/L — ABNORMAL HIGH (ref 38–126)
Anion gap: 10 (ref 5–15)
BUN: 17 mg/dL (ref 8–23)
CO2: 25 mmol/L (ref 22–32)
Calcium: 9.3 mg/dL (ref 8.9–10.3)
Chloride: 102 mmol/L (ref 98–111)
Creatinine: 1.14 mg/dL (ref 0.61–1.24)
GFR, Estimated: >60 mL/min (ref >60)
Glucose, Bld: 140 mg/dL — ABNORMAL HIGH (ref 70–99)
Potassium: 4.6 mmol/L (ref 3.5–5.1)
Sodium: 138 mmol/L (ref 135–145)
Total Bilirubin: 0.9 mg/dL (ref 0.0–1.2)
Total Protein: 6.7 g/dL (ref 6.5–8.1)

## 2023-10-02 LAB — FERRITIN: Ferritin: 165 ng/mL (ref 24–336)

## 2023-10-02 LAB — IRON AND IRON BINDING CAPACITY (CC-WL,HP ONLY)
Iron: 114 ug/dL (ref 45–182)
Saturation Ratios: 42 % — ABNORMAL HIGH (ref 17.9–39.5)
TIBC: 270 ug/dL (ref 250–450)
UIBC: 156 ug/dL

## 2023-10-02 LAB — FOLATE: Folate: 26.8 ng/mL (ref >5.9)

## 2023-10-02 LAB — VITAMIN B12: Vitamin B-12: 1228 pg/mL — ABNORMAL HIGH (ref 180–914)

## 2023-10-02 MED ORDER — SODIUM CHLORIDE 0.9 % IV SOLN: INTRAVENOUS | Status: DC

## 2023-10-02 MED ORDER — SODIUM CHLORIDE 0.9 % IV SOLN
500.0000 mg/m2 | Freq: Once | INTRAVENOUS | Status: AC
  Administered 2023-10-02: 912 mg via INTRAVENOUS
  Filled 2023-10-02: qty 23.99

## 2023-10-02 MED ORDER — PROCHLORPERAZINE MALEATE 10 MG PO TABS
10.0000 mg | ORAL_TABLET | Freq: Once | ORAL | Status: AC
  Administered 2023-10-02: 10 mg via ORAL
  Filled 2023-10-02: qty 1

## 2023-10-02 NOTE — Telephone Encounter (Signed)
 Patient screened on MST. First attempt to reach since previous nutrition consults. Provided my cell# with spouse as patient was sleeping ask her to have him return call to set up a nutrition follow up.  Micheline Craven, RDN, LDN Registered Dietitian, [facility] Part Time Remote (Usual office hours: Tuesday-Thursday) Cell: [phone]

## 2023-10-02 NOTE — Patient Instructions (Signed)

## 2023-10-02 NOTE — Progress Notes (Signed)
 Patient had a unscheduled provider visit today, along with treatment, for symptom management. He requests a referral to [facility] to see if he qualifies for a clinical trial for mRNA treatment for his pancreatic cancer.  Referral sent to   [facility] [address] [place], [place] [address] [phone]  Oncology Nurse Navigator Documentation     10/02/2023    1:30 PM  Oncology Nurse Navigator Flowsheets  Navigator Follow Up Date: 10/09/2023  Navigator Follow Up Reason: Follow-up Appointment;Chemotherapy  Navigator Location [facility]  Navigator Encounter Type Follow-up Appt  Patient Visit Type MedOnc  Treatment Phase Active Tx  Barriers/Navigation Needs Coordination of Care  Interventions Referrals  Acuity Level 2-Minimal Needs (1-2 Barriers Identified)  Referrals Medical Oncology  Support Groups/Services Friends and Family  Time Spent with Patient 15

## 2023-10-02 NOTE — Progress Notes (Signed)
 Hematology and Oncology Follow Up Visit  Alexander [name] [id number] [birth year] 83 y.o. 10/02/2023  Principle Diagnosis:  Adenocarcinoma of the pancreas-stage Ib (T2N0M0) --clinical stage --BRCA (-), KRAS (+), HRD(-) --metastatic to lung   Current Therapy:        Status post biliary stent placement FOLFIRINOX -- Neoadj - s/p cycle #12/12  - start on 10/31/2022 --completed on 05/08/2023 XRT + Gemzar  -- start on 08/27/2023   - s/p 21/25 fractions of XRT    Interim History:  Alexander [name] is here today for follow-up.  He is currently on radiation and chemotherapy.   He is here today for a visit ahead of his chemotherapy. He reports trouble with the taste of foods. He has had orange juice this morning. 3 scrambled eggs, rye toast and 1 large cup of coffee. Dinner was a Contractor and mashed potatoes last night. He is also drinking a Zip Fizz 4-5 days per week which has B12 in it.   He has had no issues with nausea or vomiting.  Has had no change in bowel or bladder habits.  He has had no bleeding.  Is been no rashes.  He has had no leg swelling.  He has had no mouth sores.  No fevers.   He asks about a referral to [facility] to see if he is a candidate for mRNA treatment for his pancreatic cancer.   Overall, I would have to say that his performance status is [name].   Wt Readings from Last 3 Encounters:  10/02/23 146 lb 5 oz (66.4 kg)  09/26/23 149 lb (67.6 kg)  09/25/23 149 lb 6.4 oz (67.8 kg)   Medications:  Allergies as of 10/02/2023       Reactions   Metformin  And Related Diarrhea   Cheese Diarrhea   Allergy to soft cheese, can have hard cheese   Milk-related Compounds Diarrhea        Medication List        Accurate as of October 02, 2023 12:13 PM. If you have any questions, ask your nurse or doctor.          Accu-Chek Softclix Lancets lancets Use to test blood glucose once daily   amLODipine  10 MG tablet Commonly known as: NORVASC  Take 1 tablet (10 mg  total) by mouth daily.   Droplet Pen Needles 31G X 6 MM Misc Generic drug: Insulin  Pen Needle Use to check blood sugar TID   BD Pen Needle Nano 2nd Gen 32G X 4 MM Misc Generic drug: Insulin  Pen Needle   Embrace Pro Glucose Test test strip Generic drug: glucose blood Use as instructed   FreeStyle Libre 3 Plus Sensor Misc Change sensor every 15 days.   Lantus  SoloStar 100 UNIT/ML Solostar Pen Generic drug: insulin  glargine INJECT 20 UNITS INTO THE SKIN 2 TIMES A DAY   lipase/protease/amylase 63999 UNITS Cpep capsule Commonly known as: Creon  Take 2 capsules (72,000 Units total) by mouth 3 (three) times daily before meals.   NovoLOG  FlexPen 100 UNIT/ML FlexPen Generic drug: insulin  aspart 2-3 units with meals 3 times a day plus sliding scale as instructed, maximum 20units/day   ondansetron  8 MG tablet Commonly known as: Zofran  Take 1 tablet (8 mg total) by mouth every 8 (eight) hours as needed for nausea or vomiting.   pantoprazole  40 MG tablet Commonly known as: PROTONIX  Take 1 tablet (40 mg total) by mouth 3 (three) times daily.   prochlorperazine  10 MG tablet Commonly known as: COMPAZINE  Take  1 tablet (10 mg total) by mouth every 6 (six) hours as needed for nausea or vomiting.   saccharomyces boulardii 250 MG capsule Commonly known as: FLORASTOR Take 250 mg by mouth 2 (two) times daily.   vitamin D3 50 MCG (2000 UT) Caps Take 2,000 Units by mouth with breakfast, with lunch, and with evening meal.        Allergies:  Allergies  Allergen Reactions   Metformin  And Related Diarrhea   Cheese Diarrhea    Allergy to soft cheese, can have hard cheese   Milk-Related Compounds Diarrhea    Past Medical History, Surgical history, Social history, and Family History were reviewed and updated.  Review of Systems:  Review of Systems  Constitutional: Negative.   HENT: Negative.    Eyes: Negative.   Respiratory: Negative.    Cardiovascular: Negative.    Gastrointestinal: Negative.   Genitourinary: Negative.   Musculoskeletal: Negative.   Skin: Negative.   Neurological: Negative.   Endo/Heme/Allergies: Negative.   Psychiatric/Behavioral: Negative.       Physical Exam:  height is 5' 11 (1.803 m) and weight is 146 lb 5 oz (66.4 kg). His oral temperature is 98.3 F (36.8 C). His blood pressure is 116/78 and his pulse is 63. His respiration is 18 and oxygen saturation is 100%.   Wt Readings from Last 3 Encounters:  10/02/23 146 lb 5 oz (66.4 kg)  09/26/23 149 lb (67.6 kg)  09/25/23 149 lb 6.4 oz (67.8 kg)    Physical Exam Vitals reviewed.  HENT:     Head: Normocephalic and atraumatic.  Eyes:     Pupils: Pupils are equal, round, and reactive to light.  Cardiovascular:     Rate and Rhythm: Normal rate and regular rhythm.     Heart sounds: Normal heart sounds.  Pulmonary:     Effort: Pulmonary effort is normal.     Breath sounds: Normal breath sounds.  Abdominal:     General: Bowel sounds are normal.     Palpations: Abdomen is soft.  Musculoskeletal:        General: No tenderness or deformity. Normal range of motion.     Cervical back: Normal range of motion.  Lymphadenopathy:     Cervical: No cervical adenopathy.  Skin:    General: Skin is warm and dry.     Findings: No erythema or rash.  Neurological:     Mental Status: He is alert and oriented to person, place, and time.  Psychiatric:        Behavior: Behavior normal.        Thought Content: Thought content normal.        Judgment: Judgment normal.      Lab Results  Component Value Date   WBC 6.1 10/02/2023   HGB 11.6 (L) 10/02/2023   HCT 34.7 (L) 10/02/2023   MCV 94.0 10/02/2023   PLT 198 10/02/2023   Lab Results  Component Value Date   FERRITIN 91 06/05/2023   IRON 78 06/05/2023   TIBC 298 06/05/2023   UIBC 220 06/05/2023   IRONPCTSAT 26 06/05/2023   Lab Results  Component Value Date   RETICCTPCT 0.7 08/30/2022   RBC 3.69 (L) 10/02/2023    RETICCTABS 32.6 12/19/2013   No results found for: KPAFRELGTCHN, LAMBDASER, KAPLAMBRATIO No results found for: IGGSERUM, IGA, IGMSERUM No results found for: TOTALPROTELP, ALBUMINELP, A1GS, A2GS, BETS, BETA2SER, GAMS, MSPIKE, SPEI   Chemistry      Component Value Date/Time   NA 138 10/02/2023 1113  NA 144 11/29/2016 1054   NA 139 12/29/2015 0954   K 4.6 10/02/2023 1113   K 4.2 11/29/2016 1054   K 4.1 12/29/2015 0954   CL 102 10/02/2023 1113   CL 102 11/29/2016 1054   CO2 25 10/02/2023 1113   CO2 30 11/29/2016 1054   CO2 26 12/29/2015 0954   BUN 17 10/02/2023 1113   BUN 22 11/29/2016 1054   BUN 20.0 12/29/2015 0954   CREATININE 1.14 10/02/2023 1113   CREATININE 1.2 11/29/2016 1054   CREATININE 1.3 12/29/2015 0954      Component Value Date/Time   CALCIUM  9.3 10/02/2023 1113   CALCIUM  9.3 11/29/2016 1054   CALCIUM  9.5 12/29/2015 0954   ALKPHOS 201 (H) 10/02/2023 1113   ALKPHOS 92 (H) 11/29/2016 1054   ALKPHOS 98 12/29/2015 0954   AST 24 10/02/2023 1113   AST 16 12/29/2015 0954   ALT 24 10/02/2023 1113   ALT 20 11/29/2016 1054   ALT 13 12/29/2015 0954   BILITOT 0.9 10/02/2023 1113   BILITOT 0.71 12/29/2015 0954      Encounter Diagnoses  Name Primary?   Malignant neoplasm of tail of pancreas (HCC) Yes   Anemia, unspecified type    Impression and Plan:  Mr. Swingle is a pleasant 83 yo gentleman with original diagnosis of  polycythemia vera and early stage pancreatic cancer. He was originally treated with neoadjuvant FOLFIRINOX but unfortunately he had progressive disease.  As such, he was not able to have surgery to resect out the pancreatic mass. We are now doing chemoradiation therapy. He is now s/p 21/25 XRT treatments. He is tolerating chemotherapy well other than weight loss from poor taste of food.   CBC/CMP reviewed and stable to improved Nutritional labs pending in regards to his anemia TX today. Continue XRT.  Will discuss  referral to [facility] with Dr. Alexandra clinical navigator   RTC weekly x 3 for labs and TX RTC 3 weeks MD only, port labs, Treatment    Lauraine CHRISTELLA Dais, PA-C 9/30/202512:13 PM

## 2023-10-03 ENCOUNTER — Other Ambulatory Visit: Payer: Self-pay

## 2023-10-03 ENCOUNTER — Ambulatory Visit

## 2023-10-03 ENCOUNTER — Ambulatory Visit
Admission: RE | Admit: 2023-10-03 | Discharge: 2023-10-03 | Disposition: A | Discharge status: Home / Self Care | Source: Ambulatory Visit | Attending: Radiation Oncology | Admitting: Radiation Oncology

## 2023-10-03 DIAGNOSIS — C7801 Secondary malignant neoplasm of right lung: Secondary | ICD-10-CM | POA: Diagnosis present

## 2023-10-03 DIAGNOSIS — Z510 Encounter for antineoplastic radiation therapy: Secondary | ICD-10-CM | POA: Insufficient documentation

## 2023-10-03 DIAGNOSIS — C250 Malignant neoplasm of head of pancreas: Secondary | ICD-10-CM | POA: Insufficient documentation

## 2023-10-03 LAB — RAD ONC ARIA SESSION SUMMARY
Course Elapsed Days: 35
Plan Fractions Treated to Date: 22
Plan Prescribed Dose Per Fraction: 1.8 Gy
Plan Total Fractions Prescribed: 25
Plan Total Prescribed Dose: 45 Gy
Reference Point Dosage Given to Date: 39.6 Gy
Reference Point Session Dosage Given: 1.8 Gy
Session Number: 22

## 2023-10-04 ENCOUNTER — Other Ambulatory Visit: Payer: Self-pay

## 2023-10-04 ENCOUNTER — Inpatient Hospital Stay: Attending: Hematology & Oncology

## 2023-10-04 ENCOUNTER — Ambulatory Visit

## 2023-10-04 ENCOUNTER — Ambulatory Visit: Payer: Self-pay | Admitting: Medical Oncology

## 2023-10-04 DIAGNOSIS — C7800 Secondary malignant neoplasm of unspecified lung: Secondary | ICD-10-CM | POA: Insufficient documentation

## 2023-10-04 DIAGNOSIS — D45 Polycythemia vera: Secondary | ICD-10-CM | POA: Insufficient documentation

## 2023-10-04 DIAGNOSIS — E860 Dehydration: Secondary | ICD-10-CM | POA: Insufficient documentation

## 2023-10-04 DIAGNOSIS — Z79899 Other long term (current) drug therapy: Secondary | ICD-10-CM | POA: Insufficient documentation

## 2023-10-04 DIAGNOSIS — C259 Malignant neoplasm of pancreas, unspecified: Secondary | ICD-10-CM | POA: Insufficient documentation

## 2023-10-04 DIAGNOSIS — Z91011 Allergy to milk products, unspecified: Secondary | ICD-10-CM | POA: Insufficient documentation

## 2023-10-04 NOTE — Patient Instructions (Signed)
 Visit Information  Thank you for taking time to visit with me today. Please don't hesitate to contact me if I can be of assistance to you before our next scheduled telephone appointment.  Our next appointment is by telephone on 10/11/2023 at 1100  Following is a copy of your care plan:   Goals Addressed             This Visit's Progress    [facility]BCI Transitions of Care (TOC) Care Plan       Problems:  Recent Hospitalization for treatment of pancreatic cancer, ERCP  09/27/2023  Patient reports that he woke up in severe pain today. Mostly shoulder and thumbs.  States that he took a tylenol  and pain is decreasing.  Reports that this is the first time he has had pain like this since having a round of chemo.  Denies fever or any other symptoms. Going to radiation today. 10/04/2023  reports treatments going well.   Trial study at [facility] in 2 weeks .  He is looking forward to these possibilities.  No concerns at this time other than some tiredness. Reports today is a good day!    Goal:  Over the next 30 days, the patient will not experience hospital readmission  Interventions:  Transitions of Care: Doctor Visits  - discussed the importance of doctor visits Reviewed Signs and symptoms of infection Reviewed current weight- unchanged Reviewed change in taste with cancer treatment- reviewed that patient is hungry but food does not taste good. Encouraged patient to continue to try to eat and drink as much as possible.- worsening taste at this time but forcing himself to eat.  Encouraged patient to stay as active as possible. Reviewed pain management.  Patient interested in speaking with LCSW.  Reviewed EMR and noted LCSW is [employer]. Teams message sent to Macario Au who states she will call the patient.  Patient informed during this call.   Oncology: Assessment of understanding of oncology diagnosis:  Assessed patient understanding of cancer diagnosis and recommended treatment plan Reviewed  upcoming provider appointments and treatment appointments Encouraged patient to again continue to eat the best that he can  Patient Self Care Activities:  Attend all scheduled provider appointments Call pharmacy for medication refills 3-7 days in advance of running out of medications Call provider office for new concerns or questions  Take medications as prescribed   See MD as planned Take pain medications as needed Check your temperature Expect call from LCSW Rest when you can Call dietician back to discuss concerns.   Plan:  Telephone follow up appointment with care management team member scheduled for:  10/11/2023 at 11 am The patient has been provided with contact information for the care management team and has been advised to call with any health related questions or concerns.         Patient verbalizes understanding of instructions and care plan provided today and agrees to view in MyChart. Active MyChart status and patient understanding of how to access instructions and care plan via MyChart confirmed with patient.     Telephone follow up appointment with care management team member scheduled for: 10/11/2023  at 11 am  Please call the care guide team at [phone] if you need to cancel or reschedule your appointment.   Please call the Suicide and Crisis Lifeline: 988 call the [place]  National Suicide Prevention Lifeline: 714-284-2188 or TTY: (440) 808-7716 TTY (208) 188-8156) to talk to a trained counselor call 1-800-273-TALK (toll free, 24 hour hotline) call 911  if you are experiencing a Mental Health or Behavioral Health Crisis or need someone to talk to.  Alan Ee, RN, BSN, CEN [employer]- Transition of Care Team.  [facility] [phone]

## 2023-10-04 NOTE — Transitions of Care (Post Inpatient/ED Visit) (Signed)
 Transition of Care week 3  Visit Note  10/04/2023  Name: Alexander [name] MRN: [id number]          DOB: 30-Jun-1940  Situation: Patient enrolled in [facility] 30-day program. Visit completed with patient by telephone.   Background:   Initial Transition Care Management Follow-up Telephone Call    Past Medical History:  Diagnosis Date   Arthritis    minor; shoulders primarily (05/20/2013)   CAD (coronary artery disease)    a. 05/2013 - Chest pain/unstable angina and dynamic EKG changes showing cath showing atherosclerotic coronary artery disease manifested as diffuse ectasia, normal EF.   Diverticulosis of colon    GERD (gastroesophageal reflux disease)    GI bleed    secondary to Aspirin    Gout    Hematuria, microscopic    Hemorrhoids    Hyperglycemia    Hyperlipidemia    Hypertension    Lumbar back pain    Microalbuminuria    Overweight(278.02)    Pancreatic cancer (HCC) 09/22/2022   Polycythemia rubra vera (HCC)    Tubular adenoma of colon 07/2012    Assessment: reports today is a good day.  Still having difficulty with taste.  Patient Reported Symptoms: Cognitive Cognitive Status: No symptoms reported, Able to follow simple commands, Alert and oriented to person, place, and time, Normal speech and language skills      Neurological Neurological Review of Symptoms: No symptoms reported    HEENT HEENT Symptoms Reported: No symptoms reported      Cardiovascular Cardiovascular Symptoms Reported: No symptoms reported Weight: 145 lb (65.8 kg)  Respiratory Respiratory Symptoms Reported: No symptoms reported    Endocrine Endocrine Symptoms Reported: No symptoms reported Is patient diabetic?: Yes Is patient checking blood sugars at home?: Yes List most recent blood sugar readings, include date and time of day: CBG 128 during call.    Gastrointestinal Gastrointestinal Symptoms Reported: Change in appetite Additional Gastrointestinal Details: continues to have bad taste in  mouth with a decreased appetite.  Reviewed that patient is forcing himself to eat.  Reviewed missed call from dietician and encouraged pateint to call dietician back. Gastrointestinal Management Strategies: Nutrition support Gastrointestinal Self-Management Outcome: 4 (good) Gastrointestinal Comment: reassessed weight today and no change    Genitourinary Genitourinary Symptoms Reported: No symptoms reported    Integumentary Integumentary Symptoms Reported: No symptoms reported    Musculoskeletal Musculoskelatal Symptoms Reviewed: No symptoms reported Additional Musculoskeletal Details: denies symptoms        Psychosocial Psychosocial Symptoms Reported: No symptoms reported Additional Psychological Details: good spirits today. Behavioral Health Comment: Patient requested to speak to his LCSW. Message sent in team and Macario Au will call patient.       Today's Vitals   10/04/23 1112  Weight: 145 lb (65.8 kg)  PainSc: 0-No pain     Medications Reviewed Today     Reviewed by Rumalda Alan PENNER, RN (Registered Nurse) on 10/04/23 at 1107  Med List Status: <None>   Medication Order Taking? Sig Documenting Provider Last Dose Status Informant  Accu-Chek Softclix Lancets lancets [id number] Yes Use to test blood glucose once daily Nafziger, Darleene, NP  Active Self, Pharmacy Records  amLODipine  (NORVASC ) 10 MG tablet [id number] Yes Take 1 tablet (10 mg total) by mouth daily. Merna Darleene, NP  Active Self, Pharmacy Records  BD PEN NEEDLE NANO 2ND GEN 32G X 4 MM MISC [id number] Yes  [provider]  Active Self, Pharmacy Records  Cholecalciferol (VITAMIN D3) 50 MCG (2000 UT) CAPS [id number] Yes  Take 2,000 Units by mouth with breakfast, with lunch, and with evening meal. [provider]  Active Self, Pharmacy Records  Continuous Glucose Sensor (FREESTYLE LIBRE 3 PLUS SENSOR) MISC [id number] Yes Change sensor every 15 days. Merna Huxley, NP  Active Self, Pharmacy Records  glucose  blood (EMBRACE PRO GLUCOSE TEST) test strip [id number] Yes Use as instructed Nafziger, Huxley, NP  Active Self, Pharmacy Records  insulin  aspart (NOVOLOG  FLEXPEN) 100 UNIT/ML FlexPen [id number] Yes 2-3 units with meals 3 times a day plus sliding scale as instructed, maximum 20units/day Thapa, [place], MD  Active Self, Pharmacy Records  insulin  glargine (LANTUS  SOLOSTAR) 100 UNIT/ML Solostar Pen [id number] Yes INJECT 20 UNITS INTO THE SKIN 2 TIMES A DAY Nafziger, Cory, NP  Active Self, Pharmacy Records           Med Note RENNIS, DUROJAHYE' R   Sat Sep 15, 2023  9:56 AM) Changes on days where he has chemo  Insulin  Pen Needle (DROPLET PEN NEEDLES) 31G X 6 MM MISC [id number] Yes Use to check blood sugar TID Nafziger, Cory, NP  Active Self, Pharmacy Records  lipase/protease/amylase (CREON ) 36000 UNITS CPEP capsule [id number] Yes Take 2 capsules (72,000 Units total) by mouth 3 (three) times daily before meals.  Patient taking differently: Take 2 capsules (72,000 Units total) by mouth 3 (three) times daily before meals.   Samtani, Jai-Gurmukh, MD  Active   ondansetron  (ZOFRAN ) 8 MG tablet [id number] Yes Take 1 tablet (8 mg total) by mouth every 8 (eight) hours as needed for nausea or vomiting. Samtani, Jai-Gurmukh, MD  Active   pantoprazole  (PROTONIX ) 40 MG tablet [id number] Yes Take 1 tablet (40 mg total) by mouth 3 (three) times daily. Samtani, Jai-Gurmukh, MD  Active   prochlorperazine  (COMPAZINE ) 10 MG tablet [id number] Yes Take 1 tablet (10 mg total) by mouth every 6 (six) hours as needed for nausea or vomiting. Samtani, Jai-Gurmukh, MD  Active   saccharomyces boulardii (FLORASTOR) 250 MG capsule [id number] Yes Take 250 mg by mouth 2 (two) times daily. [provider]  Active Self, Pharmacy Records            Goals Addressed             This Visit's Progress    VBCI Transitions of Care (TOC) Care Plan       Problems:  Recent Hospitalization for treatment of pancreatic cancer, ERCP   09/27/2023  Patient reports that he woke up in severe pain today. Mostly shoulder and thumbs.  States that he took a tylenol  and pain is decreasing.  Reports that this is the first time he has had pain like this since having a round of chemo.  Denies fever or any other symptoms. Going to radiation today. 10/04/2023  reports treatments going well.   Trial study at [facility] in 2 weeks .  He is looking forward to these possibilities.  No concerns at this time other than some tiredness. Reports today is a good day!    Goal:  Over the next 30 days, the patient will not experience hospital readmission  Interventions:  Transitions of Care: Doctor Visits  - discussed the importance of doctor visits Reviewed Signs and symptoms of infection Reviewed current weight- unchanged Reviewed change in taste with cancer treatment- reviewed that patient is hungry but food does not taste good. Encouraged patient to continue to try to eat and drink as much as possible.- worsening taste at this time but forcing himself to eat.  Encouraged patient to stay as  active as possible. Reviewed pain management.  Patient interested in speaking with LCSW.  Reviewed EMR and noted LCSW is [employer]. Teams message sent to Macario Au who states she will call the patient.  Patient informed during this call.   Oncology: Assessment of understanding of oncology diagnosis:  Assessed patient understanding of cancer diagnosis and recommended treatment plan Reviewed upcoming provider appointments and treatment appointments Encouraged patient to again continue to eat the best that he can  Patient Self Care Activities:  Attend all scheduled provider appointments Call pharmacy for medication refills 3-7 days in advance of running out of medications Call provider office for new concerns or questions  Take medications as prescribed   See MD as planned Take pain medications as needed Check your temperature Expect call from LCSW Rest when  you can Call dietician back to discuss concerns.   Plan:  Telephone follow up appointment with care management team member scheduled for:  10/11/2023 at 11 am The patient has been provided with contact information for the care management team and has been advised to call with any health related questions or concerns.         Recommendation:   Continue Current Plan of Care  Follow Up Plan:   Telephone follow up appointment date/time:  10/11/2023 at 11 am  Alan Ee, RN, BSN, [employer]- Transition of Care Team.  [facility] [employer] [phone]

## 2023-10-04 NOTE — Progress Notes (Signed)
 [facility] CSW Progress Note  Clinical Child psychotherapist contacted patient by phone to follow-up on need for community resources.    Interventions: Provided patient with information about the Medicare transportation benefit.  Explored resources.  Patient does not qualify for the [employer] at this time.       Follow Up Plan:  CSW will follow-up with patient by phone     Alexander CHRISTELLA Au, LCSW Clinical Social Worker [facility]

## 2023-10-05 ENCOUNTER — Other Ambulatory Visit: Payer: Self-pay

## 2023-10-05 ENCOUNTER — Ambulatory Visit

## 2023-10-05 ENCOUNTER — Ambulatory Visit
Admission: RE | Admit: 2023-10-05 | Discharge: 2023-10-05 | Disposition: A | Discharge status: Home / Self Care | Source: Ambulatory Visit | Attending: Radiation Oncology | Admitting: Radiation Oncology

## 2023-10-05 DIAGNOSIS — Z510 Encounter for antineoplastic radiation therapy: Secondary | ICD-10-CM | POA: Diagnosis not present

## 2023-10-05 LAB — RAD ONC ARIA SESSION SUMMARY
Course Elapsed Days: 37
Plan Fractions Treated to Date: 23
Plan Prescribed Dose Per Fraction: 1.8 Gy
Plan Total Fractions Prescribed: 25
Plan Total Prescribed Dose: 45 Gy
Reference Point Dosage Given to Date: 41.4 Gy
Reference Point Session Dosage Given: 1.8 Gy
Session Number: 23

## 2023-10-08 ENCOUNTER — Ambulatory Visit

## 2023-10-08 ENCOUNTER — Encounter: Payer: Self-pay | Admitting: Hematology & Oncology

## 2023-10-08 ENCOUNTER — Other Ambulatory Visit: Payer: Self-pay

## 2023-10-08 ENCOUNTER — Ambulatory Visit
Admission: RE | Admit: 2023-10-08 | Discharge: 2023-10-08 | Disposition: A | Source: Ambulatory Visit | Attending: Radiation Oncology | Admitting: Radiation Oncology

## 2023-10-08 DIAGNOSIS — Z510 Encounter for antineoplastic radiation therapy: Secondary | ICD-10-CM | POA: Diagnosis not present

## 2023-10-08 LAB — RAD ONC ARIA SESSION SUMMARY
Course Elapsed Days: 40
Plan Fractions Treated to Date: 24
Plan Prescribed Dose Per Fraction: 1.8 Gy
Plan Total Fractions Prescribed: 25
Plan Total Prescribed Dose: 45 Gy
Reference Point Dosage Given to Date: 43.2 Gy
Reference Point Session Dosage Given: 1.8 Gy
Session Number: 24

## 2023-10-09 ENCOUNTER — Ambulatory Visit
Admission: RE | Admit: 2023-10-09 | Discharge: 2023-10-09 | Disposition: A | Discharge status: Home / Self Care | Source: Ambulatory Visit | Attending: Radiation Oncology | Admitting: Radiation Oncology

## 2023-10-09 ENCOUNTER — Inpatient Hospital Stay: Admitting: Hematology & Oncology

## 2023-10-09 ENCOUNTER — Inpatient Hospital Stay

## 2023-10-09 ENCOUNTER — Telehealth: Payer: Self-pay

## 2023-10-09 ENCOUNTER — Other Ambulatory Visit: Payer: Self-pay

## 2023-10-09 ENCOUNTER — Ambulatory Visit: Payer: Self-pay

## 2023-10-09 ENCOUNTER — Ambulatory Visit

## 2023-10-09 ENCOUNTER — Encounter: Payer: Self-pay | Admitting: Hematology & Oncology

## 2023-10-09 VITALS — BP 121/63 | HR 57

## 2023-10-09 VITALS — BP 122/86 | HR 78 | Temp 97.6°F | Resp 18 | Ht 71.0 in | Wt 141.0 lb

## 2023-10-09 DIAGNOSIS — C250 Malignant neoplasm of head of pancreas: Secondary | ICD-10-CM | POA: Diagnosis not present

## 2023-10-09 DIAGNOSIS — C252 Malignant neoplasm of tail of pancreas: Secondary | ICD-10-CM

## 2023-10-09 DIAGNOSIS — E860 Dehydration: Secondary | ICD-10-CM | POA: Diagnosis not present

## 2023-10-09 DIAGNOSIS — Z91011 Allergy to milk products, unspecified: Secondary | ICD-10-CM | POA: Diagnosis not present

## 2023-10-09 DIAGNOSIS — C7800 Secondary malignant neoplasm of unspecified lung: Secondary | ICD-10-CM | POA: Diagnosis present

## 2023-10-09 DIAGNOSIS — Z79899 Other long term (current) drug therapy: Secondary | ICD-10-CM | POA: Diagnosis not present

## 2023-10-09 DIAGNOSIS — Z510 Encounter for antineoplastic radiation therapy: Secondary | ICD-10-CM | POA: Diagnosis not present

## 2023-10-09 DIAGNOSIS — C259 Malignant neoplasm of pancreas, unspecified: Secondary | ICD-10-CM | POA: Diagnosis present

## 2023-10-09 DIAGNOSIS — D45 Polycythemia vera: Secondary | ICD-10-CM | POA: Diagnosis not present

## 2023-10-09 LAB — CMP (CANCER CENTER ONLY)
ALT: 20 U/L (ref 0–44)
AST: 21 U/L (ref 15–41)
Albumin: 3.9 g/dL (ref 3.5–5.0)
Alkaline Phosphatase: 165 U/L — ABNORMAL HIGH (ref 38–126)
Anion gap: 11 (ref 5–15)
BUN: 17 mg/dL (ref 8–23)
CO2: 24 mmol/L (ref 22–32)
Calcium: 9.5 mg/dL (ref 8.9–10.3)
Chloride: 102 mmol/L (ref 98–111)
Creatinine: 1.18 mg/dL (ref 0.61–1.24)
GFR, Estimated: >60 mL/min (ref >60)
Glucose, Bld: 139 mg/dL — ABNORMAL HIGH (ref 70–99)
Potassium: 4.3 mmol/L (ref 3.5–5.1)
Sodium: 137 mmol/L (ref 135–145)
Total Bilirubin: 0.9 mg/dL (ref 0.0–1.2)
Total Protein: 6.8 g/dL (ref 6.5–8.1)

## 2023-10-09 LAB — CBC WITH DIFFERENTIAL (CANCER CENTER ONLY)
Abs Immature Granulocytes: 0.03 K/uL (ref 0.00–0.07)
Basophils Absolute: 0 K/uL (ref 0.0–0.1)
Basophils Relative: 0 %
Eosinophils Absolute: 0.1 K/uL (ref 0.0–0.5)
Eosinophils Relative: 2 %
HCT: 35.3 % — ABNORMAL LOW (ref 39.0–52.0)
Hemoglobin: 12 g/dL — ABNORMAL LOW (ref 13.0–17.0)
Immature Granulocytes: 1 %
Lymphocytes Relative: 6 %
Lymphs Abs: 0.3 K/uL — ABNORMAL LOW (ref 0.7–4.0)
MCH: 31.7 pg (ref 26.0–34.0)
MCHC: 34 g/dL (ref 30.0–36.0)
MCV: 93.4 fL (ref 80.0–100.0)
Monocytes Absolute: 0.9 K/uL (ref 0.1–1.0)
Monocytes Relative: 21 %
Neutro Abs: 3 K/uL (ref 1.7–7.7)
Neutrophils Relative %: 70 %
Platelet Count: 67 K/uL — ABNORMAL LOW (ref 150–400)
RBC: 3.78 MIL/uL — ABNORMAL LOW (ref 4.22–5.81)
RDW: 15.4 % (ref 11.5–15.5)
WBC Count: 4.2 K/uL (ref 4.0–10.5)
nRBC: 0 % (ref 0.0–0.2)

## 2023-10-09 LAB — RAD ONC ARIA SESSION SUMMARY
Course Elapsed Days: 41
Plan Fractions Treated to Date: 25
Plan Prescribed Dose Per Fraction: 1.8 Gy
Plan Total Fractions Prescribed: 25
Plan Total Prescribed Dose: 45 Gy
Reference Point Dosage Given to Date: 45 Gy
Reference Point Session Dosage Given: 1.8 Gy
Session Number: 25

## 2023-10-09 LAB — LACTATE DEHYDROGENASE: LDH: 167 U/L (ref 98–192)

## 2023-10-09 MED ORDER — SODIUM CHLORIDE 0.9 % IV SOLN: Freq: Once | INTRAVENOUS | Status: AC

## 2023-10-09 NOTE — Patient Instructions (Signed)

## 2023-10-09 NOTE — Telephone Encounter (Signed)
 Called Radiation Oncology to let them know that the patient is still in our office and he will be late. Spoke with [employer]. She told that their are booked solid patient should still head towards their office when done. If they will have  a machine open they will accommodate him, if not they will reschedule him.

## 2023-10-09 NOTE — Patient Instructions (Signed)

## 2023-10-10 ENCOUNTER — Ambulatory Visit

## 2023-10-10 ENCOUNTER — Ambulatory Visit
Admission: RE | Admit: 2023-10-10 | Discharge: 2023-10-10 | Disposition: A | Discharge status: Home / Self Care | Source: Ambulatory Visit | Attending: Radiation Oncology | Admitting: Radiation Oncology

## 2023-10-10 ENCOUNTER — Encounter: Payer: Self-pay | Admitting: Hematology & Oncology

## 2023-10-10 ENCOUNTER — Inpatient Hospital Stay

## 2023-10-10 ENCOUNTER — Encounter: Payer: Self-pay | Admitting: *Deleted

## 2023-10-10 ENCOUNTER — Other Ambulatory Visit: Payer: Self-pay

## 2023-10-10 VITALS — BP 131/68 | HR 54 | Resp 18

## 2023-10-10 DIAGNOSIS — C7800 Secondary malignant neoplasm of unspecified lung: Secondary | ICD-10-CM | POA: Diagnosis not present

## 2023-10-10 DIAGNOSIS — E860 Dehydration: Secondary | ICD-10-CM

## 2023-10-10 DIAGNOSIS — Z510 Encounter for antineoplastic radiation therapy: Secondary | ICD-10-CM | POA: Diagnosis not present

## 2023-10-10 LAB — RAD ONC ARIA SESSION SUMMARY
Course Elapsed Days: 42
Plan Fractions Treated to Date: 1
Plan Prescribed Dose Per Fraction: 1.8 Gy
Plan Total Fractions Prescribed: 3
Plan Total Prescribed Dose: 5.4 Gy
Reference Point Dosage Given to Date: 1.8 Gy
Reference Point Session Dosage Given: 1.8 Gy
Session Number: 26

## 2023-10-10 LAB — CANCER ANTIGEN 19-9: CA 19-9: 38 U/mL — ABNORMAL HIGH (ref 0–35)

## 2023-10-10 MED ORDER — SODIUM CHLORIDE 0.9 % IV SOLN: INTRAVENOUS | Status: AC

## 2023-10-10 NOTE — Progress Notes (Signed)
 Hematology and Oncology Follow Up Visit  Alexander [name] [id number] [birth year] 83 y.o. 10/10/2023  Principle Diagnosis:  Adenocarcinoma of the pancreas-stage Ib (T2N0M0) --clinical stage --BRCA (-), KRAS (+), HRD(-) --metastatic to lung   Current Therapy:        Status post biliary stent placement FOLFIRINOX -- Neoadj - s/p cycle #12/12  - start on 10/31/2022 --completed on 05/08/2023 XRT + Gemzar  -- start on 08/27/2023   - s/p 21/25 fractions of XRT    Interim History:  Alexander [name] is here today for follow-up.  He is currently on radiation and chemotherapy.  He really is having a hard time.  As such, I really think that we have to hold on his chemotherapy.  He can continue the radiation therapy.  I am not sure if the Gemzar  is just making him sick.  He says is not able to eat.  He says food tastes horrible.  He really has not been able to keep down that much because of poor taste.  He looks really is dehydrated.  We really need to give him some IV fluids.  Will get some fluids today.  Will see about giving him some fluids tomorrow.  He apparently was accepted for an evaluation into a clinical trial at [facility].  This is apparently a novel trial utilizing messenger RNA.  I would be fascinated to see how this would work.  Hopefully, he will be excepted.  He has had no problems with pain.  He has had no problems with bowels or bladder.  Has had no diarrhea.  He has had no bleeding.  He has had no leg swelling.  His last CA 19-9 was actually much better at 38.  He has had no fever.  He has had no mouth sores.  Overall, I would have to say that his performance status is probably ECOG 2.   Wt Readings from Last 3 Encounters:  10/09/23 141 lb (64 kg)  10/04/23 145 lb (65.8 kg)  10/02/23 146 lb 5 oz (66.4 kg)   Medications:  Allergies as of 10/09/2023       Reactions   Metformin  And Related Diarrhea   Cheese Diarrhea   Allergy to soft cheese, can have hard cheese   Milk-related  Compounds Diarrhea        Medication List        Accurate as of October 09, 2023 11:59 PM. If you have any questions, ask your nurse or doctor.          Accu-Chek Softclix Lancets lancets Use to test blood glucose once daily   amLODipine  10 MG tablet Commonly known as: NORVASC  Take 1 tablet (10 mg total) by mouth daily.   Droplet Pen Needles 31G X 6 MM Misc Generic drug: Insulin  Pen Needle Use to check blood sugar TID   BD Pen Needle Nano 2nd Gen 32G X 4 MM Misc Generic drug: Insulin  Pen Needle   Embrace Pro Glucose Test test strip Generic drug: glucose blood Use as instructed   FreeStyle Libre 3 Plus Sensor Misc Change sensor every 15 days.   Lantus  SoloStar 100 UNIT/ML Solostar Pen Generic drug: insulin  glargine INJECT 20 UNITS INTO THE SKIN 2 TIMES A DAY   lipase/protease/amylase 63999 UNITS Cpep capsule Commonly known as: Creon  Take 2 capsules (72,000 Units total) by mouth 3 (three) times daily before meals.   NovoLOG  FlexPen 100 UNIT/ML FlexPen Generic drug: insulin  aspart 2-3 units with meals 3 times a day plus sliding scale as  instructed, maximum 20units/day   ondansetron  8 MG tablet Commonly known as: Zofran  Take 1 tablet (8 mg total) by mouth every 8 (eight) hours as needed for nausea or vomiting.   pantoprazole  40 MG tablet Commonly known as: PROTONIX  Take 1 tablet (40 mg total) by mouth 3 (three) times daily.   prochlorperazine  10 MG tablet Commonly known as: COMPAZINE  Take 1 tablet (10 mg total) by mouth every 6 (six) hours as needed for nausea or vomiting.   saccharomyces boulardii 250 MG capsule Commonly known as: FLORASTOR Take 250 mg by mouth 2 (two) times daily.   vitamin D3 50 MCG (2000 UT) Caps Take 2,000 Units by mouth with breakfast, with lunch, and with evening meal.        Allergies:  Allergies  Allergen Reactions   Metformin  And Related Diarrhea   Cheese Diarrhea    Allergy to soft cheese, can have hard cheese    Milk-Related Compounds Diarrhea    Past Medical History, Surgical history, Social history, and Family History were reviewed and updated.  Review of Systems:  Review of Systems  Constitutional: Negative.   HENT: Negative.    Eyes: Negative.   Respiratory: Negative.    Cardiovascular: Negative.   Gastrointestinal: Negative.   Genitourinary: Negative.   Musculoskeletal: Negative.   Skin: Negative.   Neurological: Negative.   Endo/Heme/Allergies: Negative.   Psychiatric/Behavioral: Negative.       Physical Exam:  height is 5' 11 (1.803 m) and weight is 141 lb (64 kg). His oral temperature is 97.6 F (36.4 C). His blood pressure is 122/86 and his pulse is 78. His respiration is 18 and oxygen saturation is 100%.   Wt Readings from Last 3 Encounters:  10/09/23 141 lb (64 kg)  10/04/23 145 lb (65.8 kg)  10/02/23 146 lb 5 oz (66.4 kg)    Physical Exam Vitals reviewed.  HENT:     Head: Normocephalic and atraumatic.  Eyes:     Pupils: Pupils are equal, round, and reactive to light.  Cardiovascular:     Rate and Rhythm: Normal rate and regular rhythm.     Heart sounds: Normal heart sounds.  Pulmonary:     Effort: Pulmonary effort is normal.     Breath sounds: Normal breath sounds.  Abdominal:     General: Bowel sounds are normal.     Palpations: Abdomen is soft.  Musculoskeletal:        General: No tenderness or deformity. Normal range of motion.     Cervical back: Normal range of motion.  Lymphadenopathy:     Cervical: No cervical adenopathy.  Skin:    General: Skin is warm and dry.     Findings: No erythema or rash.  Neurological:     Mental Status: He is alert and oriented to person, place, and time.  Psychiatric:        Behavior: Behavior normal.        Thought Content: Thought content normal.        Judgment: Judgment normal.      Lab Results  Component Value Date   WBC 4.2 10/09/2023   HGB 12.0 (L) 10/09/2023   HCT 35.3 (L) 10/09/2023   MCV 93.4  10/09/2023   PLT 67 (L) 10/09/2023   Lab Results  Component Value Date   FERRITIN 165 10/02/2023   IRON 114 10/02/2023   TIBC 270 10/02/2023   UIBC 156 10/02/2023   IRONPCTSAT 42 (H) 10/02/2023   Lab Results  Component Value Date  RETICCTPCT 0.7 08/30/2022   RBC 3.78 (L) 10/09/2023   RETICCTABS 32.6 12/19/2013   No results found for: KPAFRELGTCHN, LAMBDASER, KAPLAMBRATIO No results found for: IGGSERUM, IGA, IGMSERUM No results found for: STEPHANY CARLOTA BENSON MARKEL EARLA JOANNIE DOC VICK, SPEI   Chemistry      Component Value Date/Time   NA 137 10/09/2023 1058   NA 144 11/29/2016 1054   NA 139 12/29/2015 0954   K 4.3 10/09/2023 1058   K 4.2 11/29/2016 1054   K 4.1 12/29/2015 0954   CL 102 10/09/2023 1058   CL 102 11/29/2016 1054   CO2 24 10/09/2023 1058   CO2 30 11/29/2016 1054   CO2 26 12/29/2015 0954   BUN 17 10/09/2023 1058   BUN 22 11/29/2016 1054   BUN 20.0 12/29/2015 0954   CREATININE 1.18 10/09/2023 1058   CREATININE 1.2 11/29/2016 1054   CREATININE 1.3 12/29/2015 0954      Component Value Date/Time   CALCIUM  9.5 10/09/2023 1058   CALCIUM  9.3 11/29/2016 1054   CALCIUM  9.5 12/29/2015 0954   ALKPHOS 165 (H) 10/09/2023 1058   ALKPHOS 92 (H) 11/29/2016 1054   ALKPHOS 98 12/29/2015 0954   AST 21 10/09/2023 1058   AST 16 12/29/2015 0954   ALT 20 10/09/2023 1058   ALT 20 11/29/2016 1054   ALT 13 12/29/2015 0954   BILITOT 0.9 10/09/2023 1058   BILITOT 0.71 12/29/2015 0954        Impression and Plan:  Alexander [name] is a pleasant 83 yo gentleman with original diagnosis of  polycythemia vera and early stage pancreatic cancer. He was originally treated with neoadjuvant FOLFIRINOX but unfortunately he had progressive disease.  As such, he was not able to have surgery to resect out the pancreatic mass.   Again, we will stop the chemotherapy.  This started can be causing his poor taste.  He will continue the  radiotherapy.  I think he finishes this up in a week or so.  I probably would not do any scans on him to see how well he has responded for about 6 weeks.  I am sure that if he gets excepted into this clinical trial at [facility], they will help manage x-rays.  He will come in tomorrow for some IV fluid.  He will get IV fluid today.  Hopefully, we can get his taste back so we can start to enjoy food again.  I would like to see him back myself probably in about 3 to 4 weeks.   Maude JONELLE Crease, MD 10/8/20252:10 PM

## 2023-10-10 NOTE — Progress Notes (Signed)
 Patient came in yesterday for treatment, however he'd been having difficulty with drinking and eating. Treatment was held and he instead received symptom management. He returned today for more fluids. He continues on radiation which finishes on Friday.    He has an appointment at [facility] on 10/16/2023 for consideration of clinical trial.   Oncology Nurse Navigator Documentation     10/10/2023   12:30 PM  Oncology Nurse Navigator Flowsheets  Navigator Follow Up Date: 10/16/2023  Navigator Follow Up Reason: Review Note  Navigator Location [facility]  Navigator Encounter Type Treatment  Patient Visit Type MedOnc  Treatment Phase Active Tx  Barriers/Navigation Needs Coordination of Care  Interventions Psycho-Social Support  Acuity Level 2-Minimal Needs (1-2 Barriers Identified)  Support Groups/Services Friends and Family  Time Spent with Patient 15

## 2023-10-11 ENCOUNTER — Ambulatory Visit

## 2023-10-11 ENCOUNTER — Ambulatory Visit
Admission: RE | Admit: 2023-10-11 | Discharge: 2023-10-11 | Disposition: A | Source: Ambulatory Visit | Attending: Radiation Oncology | Admitting: Radiation Oncology

## 2023-10-11 ENCOUNTER — Other Ambulatory Visit: Payer: Self-pay

## 2023-10-11 DIAGNOSIS — Z510 Encounter for antineoplastic radiation therapy: Secondary | ICD-10-CM | POA: Diagnosis not present

## 2023-10-11 LAB — RAD ONC ARIA SESSION SUMMARY
Course Elapsed Days: 43
Plan Fractions Treated to Date: 2
Plan Prescribed Dose Per Fraction: 1.8 Gy
Plan Total Fractions Prescribed: 3
Plan Total Prescribed Dose: 5.4 Gy
Reference Point Dosage Given to Date: 3.6 Gy
Reference Point Session Dosage Given: 1.8 Gy
Session Number: 27

## 2023-10-11 NOTE — Patient Instructions (Signed)
 Visit Information  Thank you for taking time to visit with me today. Please don't hesitate to contact me if I can be of assistance to you before our next scheduled telephone appointment.  Our next appointment is by telephone on 10/18/2023 at 10 am  Following is a copy of your care plan:   Goals Addressed             This Visit's Progress    [facility] Transitions of Care (TOC) Care Plan       Problems:  Recent Hospitalization for treatment of pancreatic cancer, ERCP  09/27/2023  Patient reports that he woke up in severe pain today. Mostly shoulder and thumbs.  States that he took a tylenol  and pain is decreasing.  Reports that this is the first time he has had pain like this since having a round of chemo.  Denies fever or any other symptoms. Going to radiation today. 10/04/2023  reports treatments going well.   Trial study at [facility] in 2 weeks .  He is looking forward to these possibilities.  No concerns at this time other than some tiredness. Reports today is a good day! 10/11/2023  Reports not a good week. Reports decrease in appetite. Having difficultly drinking water without gagging.  Saw Dr. Timmy and got fluids this week and that has helped him.  Reports  sores in mouth and states that he has had for a while.  Reports that he is drinking Boost plus and taking lactaid.  Reports feeling achy but no fevers.  Continues to have radiation and will finish his radiation in 2 days.     Goal:  Over the next 30 days, the patient will not experience hospital readmission  Interventions:  Transitions of Care: Doctor Visits  - discussed the importance of doctor visits Reviewed Signs and symptoms of infection- denies fever Reviewed change in taste with cancer treatment- reviewed gagging when trying to drink water. He has spoken with his care team about this.  Encouraged patient to try ice chips. Encouraged patient to stay as active as possible. Reviewed pain management.  Message update sent to Dr.  Timmy  Oncology: Assessment of understanding of oncology diagnosis:  Assessed patient understanding of cancer diagnosis and recommended treatment plan Reviewed upcoming provider appointments and treatment appointments Encouraged patient to again continue to eat the best that he can Reviewed pending consult at [facility] for next week.   Patient Self Care Activities:  Attend all scheduled provider appointments Call pharmacy for medication refills 3-7 days in advance of running out of medications Call provider office for new concerns or questions  Take medications as prescribed   See MD as planned Take pain medications as needed Check your temperature Rest when you can  Plan:  Telephone follow up appointment with care management team member scheduled for:  10/18/2023 at 10 am The patient has been provided with contact information for the care management team and has been advised to call with any health related questions or concerns.         Patient verbalizes understanding of instructions and care plan provided today and agrees to view in MyChart. Active MyChart status and patient understanding of how to access instructions and care plan via MyChart confirmed with patient.     Telephone follow up appointment with care management team member scheduled for:  10/18/2023 at 10 am  Please call the care guide team at [phone] if you need to cancel or reschedule your appointment.   Please call the  Suicide and Crisis Lifeline: 988 call the [place]  National Suicide Prevention Lifeline: (513) 384-1569 or TTY: 773-871-5017 TTY 705-593-3370) to talk to a trained counselor call 1-800-273-TALK (toll free, 24 hour hotline) call 911 if you are experiencing a Mental Health or Behavioral Health Crisis or need someone to talk to.  Alan Ee, RN, BSN, CEN [employer]- Transition of Care Team.  [facility] [phone]

## 2023-10-11 NOTE — Transitions of Care (Post Inpatient/ED Visit) (Signed)
 Transition of Care week 4  Visit Note  10/11/2023  Name: Alexander [name] MRN: [id number]          DOB: [birth year]  Situation: Patient enrolled in [facility] 30-day program. Visit completed with patient by telephone.   Background:   Initial Transition Care Management Follow-up Telephone Call    Past Medical History:  Diagnosis Date   Arthritis    minor; shoulders primarily (05/20/2013)   CAD (coronary artery disease)    a. 05/2013 - Chest pain/unstable angina and dynamic EKG changes showing cath showing atherosclerotic coronary artery disease manifested as diffuse ectasia, normal EF.   Diverticulosis of colon    GERD (gastroesophageal reflux disease)    GI bleed    secondary to Aspirin    Gout    Hematuria, microscopic    Hemorrhoids    Hyperglycemia    Hyperlipidemia    Hypertension    Lumbar back pain    Microalbuminuria    Overweight(278.02)    Pancreatic cancer (HCC) 09/22/2022   Polycythemia rubra vera (HCC)    Tubular adenoma of colon 07/2012    Assessment: Reports that he has been feeling poorly in the last week . Worsening appetite and taste. Reports that he got IV hydration and feels some better. Reports that he was able to drink a boost plus last night and drank a cup of coffee today.  Chemo on hold this week. Has continued to go to radiation daily. Has consult at [facility] next week.  Patient Reported Symptoms: Cognitive Cognitive Status: Able to follow simple commands, Alert and oriented to person, place, and time, Normal speech and language skills      Neurological Neurological Review of Symptoms: Weakness Neurological Comment: Patient reports that he is not feeling well. achy  HEENT HEENT Symptoms Reported: Other: HEENT Comment: Report mouth sores.  Reports that he has had a large one yesterday and it disappeared today. reports multiple other area of sores in his mouth.  Denies white patches.    Cardiovascular Cardiovascular Symptoms Reported:  Fatigue Cardiovascular Comment: has gotten IV fluids for the last 2 days.  Respiratory Respiratory Symptoms Reported: No symptoms reported    Endocrine Endocrine Symptoms Reported: No symptoms reported Is patient diabetic?: Yes Is patient checking blood sugars at home?: Yes List most recent blood sugar readings, include date and time of day: CBG today at 211    Gastrointestinal Gastrointestinal Symptoms Reported: Change in appetite Additional Gastrointestinal Details: Patient reports that he continues to have a bad taste in his mouth, reports that this is worse than last week. Gastrointestinal Comment: Reports drinking boost but he is lactose intolerate. States that he is taking a Lactaid before drinking his boost.    Genitourinary Genitourinary Symptoms Reported: No symptoms reported    Integumentary Integumentary Symptoms Reported: No symptoms reported    Musculoskeletal Musculoskelatal Symptoms Reviewed: Other Other Musculoskeletal Symptoms: achey all over        Psychosocial Psychosocial Symptoms Reported: No symptoms reported           Medications Reviewed Today     Reviewed by Rumalda Alan PENNER, RN (Registered Nurse) on 10/11/23 at 1109  Med List Status: <None>   Medication Order Taking? Sig Documenting Provider Last Dose Status Informant  Accu-Chek Softclix Lancets lancets [id number] Yes Use to test blood glucose once daily Nafziger, Darleene, NP  Active Self, Pharmacy Records  amLODipine  (NORVASC ) 10 MG tablet [id number] Yes Take 1 tablet (10 mg total) by mouth daily. Merna Darleene, NP  Active Self,  Pharmacy Records  BD PEN NEEDLE NANO 2ND GEN 32G X 4 MM MISC [id number] Yes  [provider]  Active Self, Pharmacy Records  Cholecalciferol (VITAMIN D3) 50 MCG (2000 UT) CAPS [id number] Yes Take 2,000 Units by mouth with breakfast, with lunch, and with evening meal. [provider]  Active Self, Pharmacy Records  Continuous Glucose Sensor (FREESTYLE LIBRE 3 PLUS  SENSOR) MISC [id number] Yes Change sensor every 15 days. Merna Huxley, NP  Active Self, Pharmacy Records  glucose blood (EMBRACE PRO GLUCOSE TEST) test strip [id number] Yes Use as instructed Nafziger, Huxley, NP  Active Self, Pharmacy Records  insulin  aspart (NOVOLOG  FLEXPEN) 100 UNIT/ML FlexPen [id number] Yes 2-3 units with meals 3 times a day plus sliding scale as instructed, maximum 20units/day Thapa, [place], MD  Active Self, Pharmacy Records  insulin  glargine (LANTUS  SOLOSTAR) 100 UNIT/ML Solostar Pen [id number] Yes INJECT 20 UNITS INTO THE SKIN 2 TIMES A DAY Nafziger, Cory, NP  Active Self, Pharmacy Records           Med Note RENNIS, DUROJAHYE' R   Sat Sep 15, 2023  9:56 AM) Changes on days where he has chemo  Insulin  Pen Needle (DROPLET PEN NEEDLES) 31G X 6 MM MISC [id number] Yes Use to check blood sugar TID Nafziger, Cory, NP  Active Self, Pharmacy Records  lipase/protease/amylase (CREON ) 36000 UNITS CPEP capsule [id number] Yes Take 2 capsules (72,000 Units total) by mouth 3 (three) times daily before meals. Samtani, Jai-Gurmukh, MD  Active   pantoprazole  (PROTONIX ) 40 MG tablet [id number] Yes Take 1 tablet (40 mg total) by mouth 3 (three) times daily. Samtani, Jai-Gurmukh, MD  Active   saccharomyces boulardii (FLORASTOR) 250 MG capsule [id number] Yes Take 250 mg by mouth 2 (two) times daily. [provider]  Active Self, Pharmacy Records            Goals       Patient Stated      I would like to maintain my health       Patient stated (pt-stated)      Maintain weight      Pharmacy Care Plan      CARE PLAN ENTRY (see longitudinal plan of care for additional care plan information)  Current Barriers:  Chronic Disease Management support, education, and care coordination needs related to Hypertension, Hyperlipidemia, Diabetes, GERD, Gout, and Polycythemia   Hypertension BP Readings from Last 3 Encounters:  06/25/19 120/70  06/10/19 124/60  04/16/19 128/80   Pharmacist  Clinical Goal(s): Over the next 180 days, patient will work with PharmD and providers to maintain BP goal <130/80 Current regimen:  Amlodipine  10mg , 1 tablet once daily Lisinopril  10mg , 1 tablet once daily  Interventions: Discussed diet modifications. DASH diet:  following a diet emphasizing fruits and vegetables and low-fat dairy products along with whole grains, fish, poultry, and nuts. Reducing red meats and sugars.  Patient self care activities - Over the next 180 days, patient will: Check BP as instructed, document, and provide at future appointments  Hyperlipidemia Lab Results  Component Value Date/Time   LDLCALC 107 (H) 09/27/2018 09:23 AM   Pharmacist Clinical Goal(s): Over the next 180 days, patient will work with PharmD and providers to achieve LDL goal < 100 Current regimen:  Pravastatin  40mg , 1 tablet once daily Interventions: How to reduce cholesterol through diet/weight management and physical activity.    We discussed how a diet high in plant sterols (fruits/vegetables/nuts/whole grains/legumes) may reduce your cholesterol.  Encouraged increasing fiber to a daily  Patient  self care activities - Over the next 180 days, patient will: Continue current medications and continue lifestyle modifications (diet/ exercise).   Prediabetes Lab Results  Component Value Date/Time   HGBA1C 6.3 09/27/2018 09:23 AM   HGBA1C 6.2 09/06/2017 11:03 AM   Pharmacist Clinical Goal(s): Over the next 180 days, patient will work with PharmD and providers to maintain A1c goal <6.5% Current regimen:  No medications (lifestyle modifications only) Patient self care activities - Over the next 180 days, patient will: Check blood sugar as directed, document, and provide at future appointments  Polycythemia Pharmacist Clinical Goal(s) Over the next 180 days, patient will work with PharmD and providers to continue current medications and visits with oncologist.  Current regimen:  Aspirin  81mg , 1  tablet once daily  Patient self care activities - Over the next 180 days, patient will: Continue current medications and contact [facility] for blood donations.  GERD Pharmacist Clinical Goal(s) Over the next 180 days, patient will work with PharmD and providers to maintain/ minimize heartburn symptoms.  Current regimen:  Pantoprazole  40mg ,1 tablet once daily  Patient self care activities Patient will continue current medications.   Gout Pharmacist Clinical Goal(s) Over the next 180 days, patient will work with PharmD and providers to prevent gout flare-ups Current regimen:  Allopurinol  100mg , 1 tablet once daily  Patient self care activities Patient will continue current medications.   Medication management Pharmacist Clinical Goal(s): Over the next 180 days, patient will work with PharmD and providers to maintain optimal medication adherence Current pharmacy: [facility]  Interventions Comprehensive medication review performed. Continue current medication management strategy Patient self care activities - Over the next 180 days, patient will: Take medications as prescribed Report any questions or concerns to PharmD and/or provider(s)  Initial goal documentation       [facility] Transitions of Care (TOC) Care Plan      Problems:  Recent Hospitalization for treatment of pancreatic cancer, ERCP  09/27/2023  Patient reports that he woke up in severe pain today. Mostly shoulder and thumbs.  States that he took a tylenol  and pain is decreasing.  Reports that this is the first time he has had pain like this since having a round of chemo.  Denies fever or any other symptoms. Going to radiation today. 10/04/2023  reports treatments going well.   Trial study at [facility] in 2 weeks .  He is looking forward to these possibilities.  No concerns at this time other than some tiredness. Reports today is a good day! 10/11/2023  Reports not a good week. Reports decrease in appetite. Having difficultly  drinking water without gagging.  Saw Dr. Timmy and got fluids this week and that has helped him.  Reports  sores in mouth and states that he has had for a while.  Reports that he is drinking Boost plus and taking lactaid.  Reports feeling achy but no fevers.  Continues to have radiation and will finish his radiation in 2 days.     Goal:  Over the next 30 days, the patient will not experience hospital readmission  Interventions:  Transitions of Care: Doctor Visits  - discussed the importance of doctor visits Reviewed Signs and symptoms of infection- denies fever Reviewed change in taste with cancer treatment- reviewed gagging when trying to drink water. He has spoken with his care team about this.  Encouraged patient to try ice chips. Encouraged patient to stay as active as possible. Reviewed pain management.  Message update sent to Dr. Timmy  Oncology: Assessment of understanding of oncology diagnosis:  Assessed patient understanding of cancer diagnosis and recommended treatment plan Reviewed upcoming provider appointments and treatment appointments Encouraged patient to again continue to eat the best that he can Reviewed pending consult at [facility] for next week.   Patient Self Care Activities:  Attend all scheduled provider appointments Call pharmacy for medication refills 3-7 days in advance of running out of medications Call provider office for new concerns or questions  Take medications as prescribed   See MD as planned Take pain medications as needed Check your temperature Rest when you can  Plan:  Telephone follow up appointment with care management team member scheduled for:  10/18/2023 at 10 am The patient has been provided with contact information for the care management team and has been advised to call with any health related questions or concerns.          Recommendation:   Continue Current Plan of Care Call MD for worsening condition  Follow Up Plan:    Telephone follow up appointment date/time:  10/18/2023 at 10 am  Alan Ee, RN, BSN, [employer]- Transition of Care Team.  [facility] [phone]

## 2023-10-12 ENCOUNTER — Other Ambulatory Visit: Payer: Self-pay

## 2023-10-12 ENCOUNTER — Ambulatory Visit
Admission: RE | Admit: 2023-10-12 | Discharge: 2023-10-12 | Disposition: A | Discharge status: Home / Self Care | Source: Ambulatory Visit | Attending: Radiation Oncology | Admitting: Radiation Oncology

## 2023-10-12 ENCOUNTER — Other Ambulatory Visit: Payer: Self-pay | Admitting: Adult Health

## 2023-10-12 DIAGNOSIS — Z510 Encounter for antineoplastic radiation therapy: Secondary | ICD-10-CM | POA: Diagnosis not present

## 2023-10-12 DIAGNOSIS — E1169 Type 2 diabetes mellitus with other specified complication: Secondary | ICD-10-CM

## 2023-10-12 LAB — RAD ONC ARIA SESSION SUMMARY
Course Elapsed Days: 44
Plan Fractions Treated to Date: 3
Plan Prescribed Dose Per Fraction: 1.8 Gy
Plan Total Fractions Prescribed: 3
Plan Total Prescribed Dose: 5.4 Gy
Reference Point Dosage Given to Date: 5.4 Gy
Reference Point Session Dosage Given: 1.8 Gy
Session Number: 28

## 2023-10-12 MED ORDER — FREESTYLE LIBRE 3 PLUS SENSOR MISC: 6 refills | Status: DC

## 2023-10-12 NOTE — Telephone Encounter (Signed)
 Copied from CRM [id number]. Topic: Clinical - Medication Refill >> Oct 12, 2023  9:17 AM [place] E wrote: Medication: Continuous Glucose Sensor (FREESTYLE LIBRE 3 PLUS SENSOR) MISC  Has the patient contacted their pharmacy? Yes (Agent: If no, request that the patient contact the pharmacy for the refill. If patient does not wish to contact the pharmacy document the reason why and proceed with request.) (Agent: If yes, when and what did the pharmacy advise?)  This is the patient's preferred pharmacy:  [facility] [id number] [place], [place] - [address] [place] [address] Phone: [phone] Fax: [phone]  Is this the correct pharmacy for this prescription? Yes If no, delete pharmacy and type the correct one.   Has the prescription been filled recently? No  Is the patient out of the medication? Yes  Has the patient been seen for an appointment in the last year OR does the patient have an upcoming appointment? Yes  Can we respond through MyChart? Yes  Agent: Please be advised that Rx refills may take up to 3 business days. We ask that you follow-up with your pharmacy.

## 2023-10-14 ENCOUNTER — Emergency Department (HOSPITAL_COMMUNITY)

## 2023-10-14 ENCOUNTER — Other Ambulatory Visit: Payer: Self-pay

## 2023-10-14 ENCOUNTER — Encounter (HOSPITAL_COMMUNITY): Payer: Self-pay

## 2023-10-14 ENCOUNTER — Emergency Department (HOSPITAL_COMMUNITY)
Admission: EM | Admit: 2023-10-14 | Discharge: 2023-10-14 | Disposition: A | Discharge status: Home / Self Care | Attending: Emergency Medicine | Admitting: Emergency Medicine

## 2023-10-14 DIAGNOSIS — Z79899 Other long term (current) drug therapy: Secondary | ICD-10-CM | POA: Diagnosis not present

## 2023-10-14 DIAGNOSIS — I2510 Atherosclerotic heart disease of native coronary artery without angina pectoris: Secondary | ICD-10-CM | POA: Insufficient documentation

## 2023-10-14 DIAGNOSIS — Z794 Long term (current) use of insulin: Secondary | ICD-10-CM | POA: Insufficient documentation

## 2023-10-14 DIAGNOSIS — I10 Essential (primary) hypertension: Secondary | ICD-10-CM | POA: Insufficient documentation

## 2023-10-14 DIAGNOSIS — Z8507 Personal history of malignant neoplasm of pancreas: Secondary | ICD-10-CM | POA: Diagnosis not present

## 2023-10-14 DIAGNOSIS — R1013 Epigastric pain: Secondary | ICD-10-CM | POA: Insufficient documentation

## 2023-10-14 LAB — COMPREHENSIVE METABOLIC PANEL WITH GFR
ALT: 28 U/L (ref 0–44)
AST: 71 U/L — ABNORMAL HIGH (ref 15–41)
Albumin: 3.2 g/dL — ABNORMAL LOW (ref 3.5–5.0)
Alkaline Phosphatase: 156 U/L — ABNORMAL HIGH (ref 38–126)
Anion gap: 13 (ref 5–15)
BUN: 13 mg/dL (ref 8–23)
CO2: 23 mmol/L (ref 22–32)
Calcium: 9 mg/dL (ref 8.9–10.3)
Chloride: 101 mmol/L (ref 98–111)
Creatinine, Ser: 1.06 mg/dL (ref 0.61–1.24)
GFR, Estimated: >60 mL/min (ref >60)
Glucose, Bld: 138 mg/dL — ABNORMAL HIGH (ref 70–99)
Potassium: 4.8 mmol/L (ref 3.5–5.1)
Sodium: 137 mmol/L (ref 135–145)
Total Bilirubin: 1.5 mg/dL — ABNORMAL HIGH (ref 0.0–1.2)
Total Protein: 6.2 g/dL — ABNORMAL LOW (ref 6.5–8.1)

## 2023-10-14 LAB — CBC WITH DIFFERENTIAL/PLATELET
Abs Immature Granulocytes: 0.03 K/uL (ref 0.00–0.07)
Basophils Absolute: 0 K/uL (ref 0.0–0.1)
Basophils Relative: 0 %
Eosinophils Absolute: 0.2 K/uL (ref 0.0–0.5)
Eosinophils Relative: 4 %
HCT: 34.6 % — ABNORMAL LOW (ref 39.0–52.0)
Hemoglobin: 11.5 g/dL — ABNORMAL LOW (ref 13.0–17.0)
Immature Granulocytes: 1 %
Lymphocytes Relative: 7 %
Lymphs Abs: 0.3 K/uL — ABNORMAL LOW (ref 0.7–4.0)
MCH: 31.6 pg (ref 26.0–34.0)
MCHC: 33.2 g/dL (ref 30.0–36.0)
MCV: 95.1 fL (ref 80.0–100.0)
Monocytes Absolute: 0.9 K/uL (ref 0.1–1.0)
Monocytes Relative: 20 %
Neutro Abs: 3.2 K/uL (ref 1.7–7.7)
Neutrophils Relative %: 68 %
Platelets: 166 K/uL (ref 150–400)
RBC: 3.64 MIL/uL — ABNORMAL LOW (ref 4.22–5.81)
RDW: 16.5 % — ABNORMAL HIGH (ref 11.5–15.5)
WBC: 4.7 K/uL (ref 4.0–10.5)
nRBC: 0 % (ref 0.0–0.2)

## 2023-10-14 LAB — LIPASE, BLOOD: Lipase: 16 U/L (ref 11–51)

## 2023-10-14 LAB — CBG MONITORING, ED: Glucose-Capillary: 105 mg/dL — ABNORMAL HIGH (ref 70–99)

## 2023-10-14 LAB — TROPONIN I (HIGH SENSITIVITY): Troponin I (High Sensitivity): 10 ng/L (ref <18)

## 2023-10-14 MED ORDER — IOHEXOL 350 MG/ML SOLN
75.0000 mL | Freq: Once | INTRAVENOUS | Status: AC | PRN
  Administered 2023-10-14: 75 mL via INTRAVENOUS

## 2023-10-14 NOTE — ED Provider Triage Note (Signed)
 Emergency Medicine Provider Triage Evaluation Note  MARC LEICHTER , a 83 y.o. male  was evaluated in triage.  Pt complains of epigastric abdominal pain that occurred few times this morning.  Has had a little bit of that in the past.  When patient gets pain like this it means that his biliary pancreatic stent has been dislodged or not working properly.  Patient last admitted for this problem September 13 of this year.  And it sounds as if they have had to do ERCP to sort this out.  Patient without any real chest pain.  No nausea vomiting no change in his stools..  Review of Systems  Positive: As above Negative: As above  Physical Exam  BP 131/80 (BP Location: Right Arm)   Pulse 69   Temp 98.7 F (37.1 C) (Oral)   Resp 17   Ht 1.803 m (5' 11)   Wt 64 kg   SpO2 100%   BMI 19.67 kg/m  Gen: Awake, no distress   Resp: Normal effort  MSK:  Moves extremities without difficulty  Abdomen: Nondistended maybe some slight tenderness epigastric area.  Medical Decision Making  Medically screening exam initiated at 11:20 AM.  Appropriate orders placed.  Jerilynn DELENA Mori was informed that the remainder of the evaluation will be completed by another provider, this initial triage assessment does not replace that evaluation, and the importance of remaining in the ED until their evaluation is complete.  Patient's troponins pending.  EKG had a lot of artifact but no significant changes.  Chest x-ray without any acute findings.  Patient may need consultation by GI.     Raejean Swinford, MD 10/14/23 [phone]

## 2023-10-14 NOTE — Discharge Instructions (Addendum)
 Your workup today is reassuring.  Follow-up with your primary care doctor.  Return to the emergency room as needed for pain. Go eat some good food :)

## 2023-10-14 NOTE — ED Provider Notes (Signed)
 [place] EMERGENCY DEPARTMENT AT [facility] Provider Note   CSN: [id number] Arrival date & time: 10/14/23  1026     Patient presents with: Chest Pain   Alexander [name] is a 83 y.o. male.    Chest Pain Patient with a history of pancreatic cancer, reflux, gout, GI bleed, hemorrhoids, CAD, hypertension  Patient presents emergency room today with complaints of epigastric abdominal pain that occurred twice this morning both times when he bent completely over in a forward fold. When patient gets pain like this it means that his biliary pancreatic stent has been dislodged or not working properly.  Patient last admitted for this problem September 13 of this year.  And it sounds as if they have had to do ERCP to sort this out.  Patient without any real chest pain.  No nausea vomiting no change in his stools.   During both of these episodes when he stood up his symptoms completely resolved.   Prior to Admission medications   Medication Sig Start Date End Date Taking? Authorizing Provider  Accu-Chek Softclix Lancets lancets Use to test blood glucose once daily 11/09/21   Nafziger, Darleene, NP  amLODipine  (NORVASC ) 10 MG tablet Take 1 tablet (10 mg total) by mouth daily. 12/15/22   Nafziger, Darleene, NP  BD PEN NEEDLE NANO 2ND GEN 32G X 4 MM MISC  03/15/23   [provider]  Cholecalciferol (VITAMIN D3) 50 MCG (2000 UT) CAPS Take 2,000 Units by mouth with breakfast, with lunch, and with evening meal.    [provider]  Continuous Glucose Sensor (FREESTYLE LIBRE 3 PLUS SENSOR) MISC Change sensor every 15 days. 10/12/23   Nafziger, Darleene, NP  glucose blood (EMBRACE PRO GLUCOSE TEST) test strip Use as instructed 04/19/22   Nafziger, Cory, NP  insulin  aspart (NOVOLOG  FLEXPEN) 100 UNIT/ML FlexPen 2-3 units with meals 3 times a day plus sliding scale as instructed, maximum 20units/day 12/15/22   Thapa, [place], MD  insulin  glargine (LANTUS  SOLOSTAR) 100 UNIT/ML Solostar Pen INJECT  20 UNITS INTO THE SKIN 2 TIMES A DAY 02/01/23   Nafziger, Darleene, NP  Insulin  Pen Needle (DROPLET PEN NEEDLES) 31G X 6 MM MISC Use to check blood sugar TID 03/01/23   Merna Darleene, NP  lipase/protease/amylase (CREON ) 36000 UNITS CPEP capsule Take 2 capsules (72,000 Units total) by mouth 3 (three) times daily before meals. 09/18/23   Samtani, Jai-Gurmukh, MD  pantoprazole  (PROTONIX ) 40 MG tablet Take 1 tablet (40 mg total) by mouth 3 (three) times daily. 09/18/23   Samtani, Jai-Gurmukh, MD  saccharomyces boulardii (FLORASTOR) 250 MG capsule Take 250 mg by mouth 2 (two) times daily.    [provider]    Allergies: Metformin  and related, Cheese, and Milk-related compounds    Review of Systems  Cardiovascular:  Positive for chest pain.    Updated Vital Signs BP 134/78   Pulse 60   Temp 98.2 F (36.8 C) (Oral)   Resp 17   Ht 5' 11 (1.803 m)   Wt 64 kg   SpO2 100%   BMI 19.67 kg/m   Physical Exam Vitals and nursing note reviewed.  Constitutional:      General: He is not in acute distress.    Comments: Pleasant well-appearing 83 year old.  In no acute distress.  Sitting comfortably in bed.  Able answer questions appropriately follow commands. No increased work of breathing. Speaking in full sentences.   HENT:     Head: Normocephalic and atraumatic.  Nose: Nose normal.     Mouth/Throat:     Mouth: Mucous membranes are moist.  Eyes:     General: No scleral icterus. Cardiovascular:     Rate and Rhythm: Normal rate and regular rhythm.     Pulses: Normal pulses.     Heart sounds: Normal heart sounds.  Pulmonary:     Effort: Pulmonary effort is normal. No respiratory distress.     Breath sounds: No wheezing.  Abdominal:     Palpations: Abdomen is soft.     Tenderness: There is no abdominal tenderness.  Musculoskeletal:     Cervical back: Normal range of motion.     Right lower leg: No edema.     Left lower leg: No edema.  Skin:    General: Skin is warm and dry.      Capillary Refill: Capillary refill takes less than 2 seconds.  Neurological:     Mental Status: He is alert. Mental status is at baseline.  Psychiatric:        Mood and Affect: Mood normal.        Behavior: Behavior normal.     (all labs ordered are listed, but only abnormal results are displayed) Labs Reviewed  CBC WITH DIFFERENTIAL/PLATELET - Abnormal; Notable for the following components:      Result Value   RBC 3.64 (*)    Hemoglobin 11.5 (*)    HCT 34.6 (*)    RDW 16.5 (*)    Lymphs Abs 0.3 (*)    All other components within normal limits  COMPREHENSIVE METABOLIC PANEL WITH GFR - Abnormal; Notable for the following components:   Glucose, Bld 138 (*)    Total Protein 6.2 (*)    Albumin 3.2 (*)    AST 71 (*)    Alkaline Phosphatase 156 (*)    Total Bilirubin 1.5 (*)    All other components within normal limits  CBG MONITORING, ED - Abnormal; Notable for the following components:   Glucose-Capillary 105 (*)    All other components within normal limits  LIPASE, BLOOD  TROPONIN I (HIGH SENSITIVITY)    EKG: EKG Interpretation Date/Time:  Sunday October 14 2023 10:38:09 EDT Ventricular Rate:  62 PR Interval:  100 QRS Duration:  100 QT Interval:  404 QTC Calculation: 410 R Axis:   67  Text Interpretation: Sinus rhythm with short PR Otherwise normal ECG When compared with ECG of 15-Sep-2023 03:44, PREVIOUS ECG IS PRESENT Interpretation limited secondary to artifact Confirmed by Zackowski, Scott [id number]) on 10/14/2023 11:22:40 AM  Radiology: CT ABDOMEN PELVIS W CONTRAST Result Date: 10/14/2023 EXAM: CT ABDOMEN AND PELVIS WITH CONTRAST 10/14/2023 12:54:54 PM TECHNIQUE: CT of the abdomen and pelvis was performed with the administration of 75 mL of iohexol  (OMNIPAQUE ) 350 MG/ML injection. Multiplanar reformatted images are provided for review. Automated exposure control, iterative reconstruction, and/or weight-based adjustment of the mA/kV was utilized to reduce the radiation  dose to as low as reasonably achievable. COMPARISON: 09/15/2023 CLINICAL HISTORY: Epigastric pain. Substernal chest pain that radiated to his back, 1st episode was at 0930 and occurred while bending down to pick a green pepper from garden. Resolved after that but then had another episode happened while just sitting down. Hx pancreatic cancer; similar to his episodes when his pancreatic stent gets clogged. Denies SOB n/v/sweating. FINDINGS: LOWER CHEST: Right lower lobe lung nodule measures 0.7 cm, image 15/5. Previously this measured 0.9 cm. The other previously noted right lower lobe lung nodule measures 7 mm, as of  12/05. Unchanged from the previous exam. Similar scarring within the posterior right lower lobe. 2 small nodules within the left lower lobe are unchanged as well. Paravertebral nodule within the right lower lobe measures 6 mm, not imaged on the most recent previous exam. On study from 10/04/2022, this measured 4 mm. LIVER: There is no suspicious liver lesion identified. GALLBLADDER AND BILE DUCTS: Common bile duct stent is in place. Pneumobilia is identified, compatible with biliary patency. Air is also noted filling the gallbladder. SPLEEN: Within normal limits in size and appearance. PANCREAS: Soft tissue mass involving the head of the pancreas measuring 3.9 cm versus 4 cm previously. Marked dilatation of the main duct involving the neck, body, and tail of pancreas is unchanged measuring 1.2 cm. Atrophy of the pancreatic parenchyma from the neck through the tail is also unchanged. ADRENAL GLANDS: Normal size and morphology bilaterally. No nodule, thickening, or hemorrhage. No periadrenal stranding. KIDNEYS, URETERS AND BLADDER: Exophytic, Bosniak class 1 cyst arises off the posterior left kidney measuring 3.3 cm, image 28/3. No follow-up imaging recommended. No stones in the kidneys or ureters. No hydronephrosis. No perinephric or periureteral stranding. Urinary bladder is unremarkable. GI AND BOWEL:  Stomach is unremarkable. No pathologic dilatation of the bowel loops. The appendix is visualized and normal in caliber, without wall thickening, periappendiceal inflammation, or fluid. Sigmoid colon demonstrates diverticulosis without evidence of diverticulitis. No bowel wall thickening, pericolonic stranding, abscess, or free air. Moderate retained stool in the rectum. PERITONEUM AND RETROPERITONEUM: No ascites or focal fluid collection. No peritoneal nodularity or mass. VASCULATURE: Aorta is normal in caliber. Aortic atherosclerotic calcification. Perigastric varicosities identified. LYMPH NODES: No abdominal or pelvic lymph nodes. REPRODUCTIVE ORGANS: No acute abnormality. BONES AND SOFT TISSUES: Unchanged fat-containing right inguinal hernia. No acute osseous abnormality. No focal soft tissue abnormality. IMPRESSION: 1. No acute findings. 2. Grossly unchanged pancreatic head mass measuring 3.9 cm (previously 4.0 cm). 3. Unchanged marked main pancreatic duct dilation to 1.2 cm with downstream pancreatic atrophy. 4. Common bile duct stent in place with pneumobilia, compatible with biliary patency. 5. Right lower lobe pulmonary nodules: one decreased to 7 mm from 9 mm; another stable at 7 mm; paravertebral right lower lobe nodule increased to 6 mm from 4 mm (since 10/04/2022). 6. Aortic atherosclerotic calcification. Electronically signed by: Waddell Calk MD 10/14/2023 02:07 PM EDT RP Workstation: [id number]   DG Chest 2 View Result Date: 10/14/2023 CLINICAL DATA:  Chest pain, history of pancreas cancer EXAM: CHEST - 2 VIEW COMPARISON:  09/15/2023 FINDINGS: Normal heart size and vascularity. Right IJ power port catheter tip SVC RA junction, unchanged. Similar scarring and postsurgical changes in the right lung base. No superimposed acute airspace process, pneumonia, collapse or consolidation. No large effusion or pneumothorax. Trachea midline. Aorta atherosclerotic. Degenerative changes noted spine. Biliary  stent partially imaged in the upper abdomen. IMPRESSION: Stable chest exam. No interval change or acute process by plain radiography. Electronically Signed   By: CHRISTELLA.  Shick M.D.   On: 10/14/2023 11:04     Procedures   Medications Ordered in the ED  iohexol  (OMNIPAQUE ) 350 MG/ML injection 75 mL (75 mLs Intravenous Contrast Given 10/14/23 1249)                                    Medical Decision Making Amount and/or Complexity of Data Reviewed Labs: ordered. Radiology: ordered.  Risk Prescription drug management.   Patient with a history  of pancreatic cancer, reflux, gout, GI bleed, hemorrhoids, CAD, hypertension  Patient presents emergency room today with complaints of epigastric abdominal pain that occurred twice this morning both times when he bent completely over in a forward fold. When patient gets pain like this it means that his biliary pancreatic stent has been dislodged or not working properly.  Patient last admitted for this problem September 13 of this year.  And it sounds as if they have had to do ERCP to sort this out.  Patient without any real chest pain.  No nausea vomiting no change in his stools.   During both of these episodes when he stood up his symptoms completely resolved.   Patient has no symptoms now, normal troponin, CBC without leukocytosis CMP with mildly elevated bilirubin at 1.5 patient states that this is not abnormal for him given his disease history, lipase normal  CT abdomen pelvis shows pancreatic duct stent in place IMPRESSION:  1. No acute findings.  2. Grossly unchanged pancreatic head mass measuring 3.9 cm (previously 4.0 cm).  3. Unchanged marked main pancreatic duct dilation to 1.2 cm with downstream  pancreatic atrophy.  4. Common bile duct stent in place with pneumobilia, compatible with biliary  patency.  5. Right lower lobe pulmonary nodules: one decreased to 7 mm from 9 mm; another  stable at 7 mm; paravertebral right lower lobe nodule  increased to 6 mm from 4  mm (since 10/04/2022).  6. Aortic atherosclerotic calcification.    Electronically signed by: Waddell Calk MD 10/14/2023 02:07 PM EDT RP  Workstation: [id number]   Patient feels fine now chest x-ray unremarkable  Will discharge home patient is requesting discharge at this time and is very hungry.   Final diagnoses:  Epigastric pain    ED Discharge Orders     None          Neldon Hamp RAMAN, [place] 10/14/23 1551    Tegeler, Lonni PARAS, MD 10/15/23 1556

## 2023-10-14 NOTE — ED Notes (Signed)
 Patient transported to CT

## 2023-10-14 NOTE — ED Triage Notes (Signed)
 Substernal chest pain that radiated to his back, 1st episode was at 0930 and occurred while bending down to pick a green pepper from garden.  Resolved after that but then had another episode happened while just sitting down.  Hx pancreatic cancer similar to his episodes when his pancreatic stent gets clogged.  Denies SOB n/v/sweating.

## 2023-10-14 NOTE — ED Notes (Signed)
 Pt states his BGL was starting to run low per his sensor. BGL checked and 105. Pt has not eaten today. He also did not take his insulin  this morning. Pt advised to continue to monitor and let this paramedic know if he feels he is getting too low.

## 2023-10-15 ENCOUNTER — Inpatient Hospital Stay

## 2023-10-15 ENCOUNTER — Ambulatory Visit: Admitting: Hematology & Oncology

## 2023-10-15 ENCOUNTER — Telehealth: Payer: Self-pay | Admitting: Gastroenterology

## 2023-10-15 ENCOUNTER — Ambulatory Visit

## 2023-10-15 ENCOUNTER — Encounter: Payer: Self-pay | Admitting: Hematology & Oncology

## 2023-10-15 NOTE — Telephone Encounter (Signed)
 Inbound call from patient stating he was seen in the emergency room yesterday 10/11/23 with complaints of epigastric abdominal pain that occurred twice in the morning both times was when he bent completely over in a forward fold, patient would like to be advised on what to do since he's had a biliary pancreatic stent done about 3 times  Please advise  Thank you

## 2023-10-15 NOTE — Telephone Encounter (Signed)
 I am sorry to hear this. This gentleman has metastatic pancreas cancer. I do not know what else I can offer for him unless his bilirubin starts trending upwards at which point he will need to come back to the emergency department and have another stent placed. His last ERCP showed stent tissue ingrowth with placement of a distal fully covered self-expanding metal stent to minimize stent ingrowth in future. His CT shows he has pneumobilia, so it is patent. I recommend he continue to follow-up with oncology. If LFTs increase, will need repeat evaluation via ERCP. Thanks. GM

## 2023-10-15 NOTE — Telephone Encounter (Signed)
 The pt states he is feeling much better today and will follow up with oncology as needed.

## 2023-10-15 NOTE — Telephone Encounter (Signed)
 Dr Wilhelmenia please review the ED note and advise how you would like to proceed

## 2023-10-17 NOTE — Radiation Completion Notes (Addendum)
  Radiation Oncology         [phone]) [phone] ________________________________  Name: Alexander [name] MRN: [id number]  Date of Service: 10/12/2023  DOB: 09-07-1940  End of Treatment Note  Diagnosis: Stage IB (cT2, N0, M0) adenocarcinoma of the pancreas Intent: Palliative     ==========DELIVERED PLANS==========  First Treatment Date: 2023-08-29 Last Treatment Date: 2023-10-12   Plan Name: Abd_Pancreas Site: Pancreas Technique: IMRT Mode: Photon Dose Per Fraction: 1.8 Gy Prescribed Dose (Delivered / Prescribed): 45 Gy / 45 Gy Prescribed Fxs (Delivered / Prescribed): 25 / 25   Plan Name: Pancreas_Bst Site: Pancreas Technique: IMRT Mode: Photon Dose Per Fraction: 1.8 Gy Prescribed Dose (Delivered / Prescribed): 5.4 Gy / 5.4 Gy Prescribed Fxs (Delivered / Prescribed): 3 / 3     ====================================   The patient tolerated radiation. He experienced poor taste and appetite. He was given IV fluids to help with this. He also experienced mild fatigue.   The patient will return in one month and will continue follow up with Dr. Timmy as well.      Ronita Due, PA-C

## 2023-10-18 ENCOUNTER — Other Ambulatory Visit: Payer: Self-pay

## 2023-10-18 DIAGNOSIS — C250 Malignant neoplasm of head of pancreas: Secondary | ICD-10-CM

## 2023-10-18 NOTE — Patient Instructions (Signed)
 Visit Information  Thank you for taking time to visit with me today.   Following is a copy of your care plan:   Goals Addressed             This Visit's Progress    COMPLETED: [facility] Transitions of Care (TOC) Care Plan       Problems:  Recent Hospitalization for treatment of pancreatic cancer, ERCP  09/27/2023  Patient reports that he woke up in severe pain today. Mostly shoulder and thumbs.  States that he took a tylenol  and pain is decreasing.  Reports that this is the first time he has had pain like this since having a round of chemo.  Denies fever or any other symptoms. Going to radiation today. 10/04/2023  reports treatments going well.   Trial study at [facility] in 2 weeks .  He is looking forward to these possibilities.  No concerns at this time other than some tiredness. Reports today is a good day! 10/11/2023  Reports not a good week. Reports decrease in appetite. Having difficultly drinking water without gagging.  Saw Dr. Timmy and got fluids this week and that has helped him.  Reports  sores in mouth and states that he has had for a while.  Reports that he is drinking Boost plus and taking lactaid.  Reports feeling achy but no fevers.  Continues to have radiation and will finish his radiation in 2 days.  10/18/2023  Patient continues to have severe problems with eating. Reports when he puts food in his mouth it taste so bad that he has to spit it out.  Reports that he has tried potatoes. Bagels, cookies and anything he can think of.  Reports that he has a good appetite but can not eat the food.  Reports consult at [facility] went well and he will start an oral chemo in the next week.  1 recent ED visit for abdominal pain after bending over outside. Reports he was told that the stent got kinked.  Reports no pain right now. CBG range today of 85-120.     Goal:  Over the next 30 days, the patient will not experience hospital readmission  Interventions:  Transitions of Care: Doctor Visits  -  discussed the importance of doctor visits Reviewed Signs and symptoms of infection- denies fever Reviewed change in taste with cancer treatment- reviewed gagging when trying to drink water. He has spoken with his care team about this. Encouraged patient to call the dietician and MD if this continues Encouraged patient to try ice chips. Encouraged patient to stay as active as possible. Reviewed pain management.  CBG levels reviewed.   Oncology: Assessment of understanding of oncology diagnosis:  Assessed patient understanding of cancer diagnosis and recommended treatment plan Reviewed upcoming provider appointments and treatment appointments Encouraged patient to again continue to eat the best that he can  Patient Self Care Activities:  Attend all scheduled provider appointments Call pharmacy for medication refills 3-7 days in advance of running out of medications Call provider office for new concerns or questions  Take medications as prescribed   See MD as planned Take pain medications as needed Eat when you can. Stay hydrated. If feeling worse call Dr. Timmy or call the symptom clinic Rest when you can  Plan:  Patient has completed [facility] program successfully. He has agreed to [facility] services. Referral placed.         Patient verbalizes understanding of instructions and care plan provided today and agrees to view  in MyChart. Active MyChart status and patient understanding of how to access instructions and care plan via MyChart confirmed with patient.     A referral has been place for CCM. RN to call patient within 2 weeks.   Please call the care guide team at [phone] if you need to cancel or reschedule your appointment.   Please call the Suicide and Crisis Lifeline: 988 call the [place]  National Suicide Prevention Lifeline: 641-566-6541 or TTY: 757 762 7064 TTY 331-157-6654) to talk to a trained counselor call 1-800-273-TALK (toll free, 24 hour hotline) call 911 if you are  experiencing a Mental Health or Behavioral Health Crisis or need someone to talk to.  Alan Ee, RN, BSN, CEN [employer]- Transition of Care Team.  [facility] [phone]

## 2023-10-18 NOTE — Transitions of Care (Post Inpatient/ED Visit) (Signed)
 Transition of Care Week 5  Visit Note  10/18/2023  Name: Alexander [name] MRN: [id number]          DOB: [birth year]  Situation: Patient enrolled in [facility] 30-day program. Visit completed with patient by telephone.   Background:   Initial Transition Care Management Follow-up Telephone Call Discharge Date and Diagnosis: 09/18/23, Choledocholithiasis   Past Medical History:  Diagnosis Date   Arthritis    minor; shoulders primarily (05/20/2013)   CAD (coronary artery disease)    a. 05/2013 - Chest pain/unstable angina and dynamic EKG changes showing cath showing atherosclerotic coronary artery disease manifested as diffuse ectasia, normal EF.   Diverticulosis of colon    GERD (gastroesophageal reflux disease)    GI bleed    secondary to Aspirin    Gout    Hematuria, microscopic    Hemorrhoids    Hyperglycemia    Hyperlipidemia    Hypertension    Lumbar back pain    Microalbuminuria    Overweight(278.02)    Pancreatic cancer (HCC) 09/22/2022   Polycythemia rubra vera (HCC)    Tubular adenoma of colon 07/2012    Assessment: Reports he is terrible because he can not eat. Reports putting food in his mouth and having to spit it out.  Appetite is good but food taste terrible.  Went to [facility] for consult and will start oral chemo in the next week.  Patient Reported Symptoms: Cognitive Cognitive Status: Able to follow simple commands, Alert and oriented to person, place, and time, Normal speech and language skills      Neurological Neurological Review of Symptoms: Weakness    HEENT HEENT Symptoms Reported: Other: HEENT Comment: Continues to report poor taste and decrease in oral intake    Cardiovascular Cardiovascular Symptoms Reported: No symptoms reported    Respiratory Respiratory Symptoms Reported: No symptoms reported    Endocrine Endocrine Symptoms Reported: No symptoms reported Is patient diabetic?: Yes Is patient checking blood sugars at home?: Yes List most recent  blood sugar readings, include date and time of day: CBG today- 85, 120, 102 Endocrine Self-Management Outcome: 5 (very good)  Gastrointestinal Gastrointestinal Symptoms Reported: Other Other Gastrointestinal Symptoms: bad taste in mouth reports good appetite. No abdominal pain today      Genitourinary Genitourinary Symptoms Reported: No symptoms reported    Integumentary Integumentary Symptoms Reported: No symptoms reported    Musculoskeletal Musculoskelatal Symptoms Reviewed: No symptoms reported        Psychosocial Psychosocial Symptoms Reported: Not assessed         Today's Vitals   10/18/23 1330  PainSc: 0-No pain     Medications Reviewed Today     Reviewed by Rumalda Alan PENNER, RN (Registered Nurse) on 10/18/23 at 1315  Med List Status: <None>   Medication Order Taking? Sig Documenting Provider Last Dose Status Informant  Accu-Chek Softclix Lancets lancets [id number] Yes Use to test blood glucose once daily Nafziger, Darleene, NP  Active Self, Pharmacy Records  amLODipine  (NORVASC ) 10 MG tablet [id number] Yes Take 1 tablet (10 mg total) by mouth daily. Nafziger, Darleene, NP  Active Self, Pharmacy Records  BD PEN NEEDLE NANO 2ND GEN 32G X 4 MM MISC [id number] Yes  [provider]  Active Self, Pharmacy Records  Cholecalciferol (VITAMIN D3) 50 MCG (2000 UT) CAPS [id number] Yes Take 2,000 Units by mouth with breakfast, with lunch, and with evening meal. [provider]  Active Self, Pharmacy Records  Continuous Glucose Sensor (FREESTYLE LIBRE 3 PLUS SENSOR) MISC [id number] Yes Change  sensor every 15 days. Nafziger, Darleene, NP  Active   glucose blood (EMBRACE PRO GLUCOSE TEST) test strip [id number] Yes Use as instructed Nafziger, Darleene, NP  Active Self, Pharmacy Records  insulin  aspart (NOVOLOG  FLEXPEN) 100 UNIT/ML FlexPen [id number] Yes 2-3 units with meals 3 times a day plus sliding scale as instructed, maximum 20units/day Thapa, [place], MD  Active Self, Pharmacy Records  insulin   glargine (LANTUS  SOLOSTAR) 100 UNIT/ML Solostar Pen [id number] Yes INJECT 20 UNITS INTO THE SKIN 2 TIMES A DAY Nafziger, Cory, NP  Active Self, Pharmacy Records           Med Note RENNIS, DUROJAHYE' R   Sat Sep 15, 2023  9:56 AM) Changes on days where he has chemo  Insulin  Pen Needle (DROPLET PEN NEEDLES) 31G X 6 MM MISC [id number] Yes Use to check blood sugar TID Nafziger, Cory, NP  Active Self, Pharmacy Records  lipase/protease/amylase (CREON ) 36000 UNITS CPEP capsule [id number] Yes Take 2 capsules (72,000 Units total) by mouth 3 (three) times daily before meals. Samtani, Jai-Gurmukh, MD  Active   pantoprazole  (PROTONIX ) 40 MG tablet [id number] Yes Take 1 tablet (40 mg total) by mouth 3 (three) times daily. Samtani, Jai-Gurmukh, MD  Active   saccharomyces boulardii (FLORASTOR) 250 MG capsule [id number] Yes Take 250 mg by mouth 2 (two) times daily. [provider]  Active Self, Pharmacy Records            Goals Addressed             This Visit's Progress    COMPLETED: [facility] Transitions of Care (TOC) Care Plan       Problems:  Recent Hospitalization for treatment of pancreatic cancer, ERCP  09/27/2023  Patient reports that he woke up in severe pain today. Mostly shoulder and thumbs.  States that he took a tylenol  and pain is decreasing.  Reports that this is the first time he has had pain like this since having a round of chemo.  Denies fever or any other symptoms. Going to radiation today. 10/04/2023  reports treatments going well.   Trial study at [facility] in 2 weeks .  He is looking forward to these possibilities.  No concerns at this time other than some tiredness. Reports today is a good day! 10/11/2023  Reports not a good week. Reports decrease in appetite. Having difficultly drinking water without gagging.  Saw Dr. Timmy and got fluids this week and that has helped him.  Reports  sores in mouth and states that he has had for a while.  Reports that he is drinking Boost plus and  taking lactaid.  Reports feeling achy but no fevers.  Continues to have radiation and will finish his radiation in 2 days.  10/18/2023  Patient continues to have severe problems with eating. Reports when he puts food in his mouth it taste so bad that he has to spit it out.  Reports that he has tried potatoes. Bagels, cookies and anything he can think of.  Reports that he has a good appetite but can not eat the food.  Reports consult at [facility] went well and he will start an oral chemo in the next week.  1 recent ED visit for abdominal pain after bending over outside. Reports he was told that the stent got kinked.  Reports no pain right now. CBG range today of 85-120.     Goal:  Over the next 30 days, the patient will not experience hospital readmission  Interventions:  Transitions of Care:  Doctor Visits  - discussed the importance of doctor visits Reviewed Signs and symptoms of infection- denies fever Reviewed change in taste with cancer treatment- reviewed gagging when trying to drink water. He has spoken with his care team about this. Encouraged patient to call the dietician and MD if this continues Encouraged patient to try ice chips. Encouraged patient to stay as active as possible. Reviewed pain management.  CBG levels reviewed.   Oncology: Assessment of understanding of oncology diagnosis:  Assessed patient understanding of cancer diagnosis and recommended treatment plan Reviewed upcoming provider appointments and treatment appointments Encouraged patient to again continue to eat the best that he can  Patient Self Care Activities:  Attend all scheduled provider appointments Call pharmacy for medication refills 3-7 days in advance of running out of medications Call provider office for new concerns or questions  Take medications as prescribed   See MD as planned Take pain medications as needed Eat when you can. Stay hydrated. If feeling worse call Dr. Timmy or call the symptom  clinic Rest when you can  Plan:  Patient has completed [facility] program successfully. He has agreed to [facility] services. Referral placed.          Recommendation:   Referral to: CCM services  Follow Up Plan:   Closing From:  Transitions of Care Program  Alan Ee, RN, BSN, [employer]- Transition of Care Team.  [facility] [phone]

## 2023-10-22 ENCOUNTER — Telehealth: Payer: Self-pay | Admitting: *Deleted

## 2023-10-22 NOTE — Progress Notes (Signed)
 Complex Care Management Note  Care Guide Note 10/22/2023 Name: Alexander [name] MRN: [id number] DOB: 11/07/1940  Alexander [name] is a 83 y.o. year old male who sees Nafziger, Darleene, NP for primary care. I reached out to Alexander [name] by phone today to offer complex care management services.  Mr. Angert was given information about Complex Care Management services today including:   The Complex Care Management services include support from the care team which includes your Nurse Care Manager, Clinical Social Worker, or Pharmacist.  The Complex Care Management team is here to help remove barriers to the health concerns and goals most important to you. Complex Care Management services are voluntary, and the patient may decline or stop services at any time by request to their care team member.   Complex Care Management Consent Status: Patient agreed to services and verbal consent obtained.   Follow up plan:  Telephone appointment with complex care management team member scheduled for:  10/29/2023  Encounter Outcome:  Patient Scheduled  Thedford Franks, CMA [place]  [facility], [facility] Guide Direct Dial : [phone]  Fax: [phone] Website: [email or web address]

## 2023-10-29 ENCOUNTER — Telehealth

## 2023-10-30 ENCOUNTER — Encounter: Payer: Self-pay | Admitting: Endocrinology

## 2023-10-31 ENCOUNTER — Other Ambulatory Visit: Payer: Self-pay | Admitting: Endocrinology

## 2023-11-05 ENCOUNTER — Other Ambulatory Visit: Payer: Self-pay

## 2023-11-05 ENCOUNTER — Encounter: Payer: Self-pay | Admitting: *Deleted

## 2023-11-05 NOTE — Progress Notes (Signed)
 Patient is working with [facility] at this time. He is waiting for a clinical trial opportunity. While waiting he is on oral Xeloda. He will continue to follow up with this office.   Oncology Nurse Navigator Documentation     11/05/2023    8:45 AM  Oncology Nurse Navigator Flowsheets  Navigator Follow Up Date: 11/13/2023  Navigator Follow Up Reason: Follow-up Appointment  Navigator Location [facility]  Navigator Encounter Type Appt/Treatment Plan Review  Patient Visit Type MedOnc  Treatment Phase Active Tx  Barriers/Navigation Needs Coordination of Care  Interventions None Required  Acuity Level 2-Minimal Needs (1-2 Barriers Identified)  Support Groups/Services Friends and Family  Time Spent with Patient 15

## 2023-11-09 ENCOUNTER — Encounter: Payer: Self-pay | Admitting: Radiation Oncology

## 2023-11-09 NOTE — Progress Notes (Signed)
 Radiation Oncology         [phone] ________________________________  Name: Alexander [name] MRN: [id number]  Date: 11/12/2023  DOB: 02-26-1940  Follow-Up Visit Note  CC: Merna Huxley, NP  Merna Huxley, NP  No diagnosis found.  Diagnosis: The encounter diagnosis was Malignant neoplasm of head of pancreas (HCC).   Adenocarcinoma of the pancreas-stage Ib (T2N0M0); BRCA (-), KRAS (+), HRD(-), now with metastatic disease to the right lower lung: s/p neoadjuvant chemotherapy followed by RLL wedge resection    Cancer Staging  Pancreatic cancer [facility]) Staging form: Exocrine Pancreas, AJCC 8th Edition - Clinical stage from 09/22/2022: Stage IB (cT2, cN0, cM0) - Signed by Timmy Maude SAUNDERS, MD on 09/22/2022  Interval Since Last Radiation: 1 month   Intent: Palliative  Radiation Treatment Dates: First Treatment Date: 2023-08-29 -- Last Treatment Date: 2023-10-12 Site/Dose/Technique/Mode:  Plan Name: Abd_Pancreas Site: Pancreas Technique: IMRT Mode: Photon Dose Per Fraction: 1.8 Gy Prescribed Dose (Delivered / Prescribed): 45 Gy / 45 Gy Prescribed Fxs (Delivered / Prescribed): 25 / 25   Plan Name: Pancreas_Bst Site: Pancreas Technique: IMRT Mode: Photon Dose Per Fraction: 1.8 Gy Prescribed Dose (Delivered / Prescribed): 5.4 Gy / 5.4 Gy Prescribed Fxs (Delivered / Prescribed): 3 / 3  Narrative:  The patient returns today for routine follow-up. He tolerated radiation therapy relatively well. He did have some mild fatigue and poor taste and appetite during treatment; he was given IV fluids to help with this.  He was hospitalized for management of Cholangitis in the setting of CBD obstruction while undergoing radiation therapy from 09/13 through 09/18/23. Hospital course consisted of ERCP with common bile duct stent placement; performed on 09/15. Imaging performed upon admission included a CT AP (on 09/15/23) which redemonstrated the obstructive pancreatic head tumor with a metal  CBD stent in place and stability of the spiculated pancreatic soft tissue along the distal CBD stent; similar appearing intra- and extrahepatic biliary ductal dilatation with regression of the main pancreatic ductal dilatation; and an increase in size of the right lung nodules ranging from 7-9 mm. No other acute finding were demonstrated in the abdomen or pelvis.     To review, he began receiving Gemzar  on 08/27/23 under Dr. Timmy. His poor taste (as previously noted) was likely attributed to Gemzar , and he was noted to have lost some weight secondary to poor taste during a visit with Dr. Timmy on 10/02/23. His lack of p.o intake persisted in this setting to the point where all food tasted horrible to him and he eventually became really dehydrated and weak. Given his progressive symptoms, Dr. Timmy discontinued Gemzar  on 10/09/23.   He also presented to the ED on 10/14/23 with c/o several episodes of epigastric abdominal pain occurring that morning. CT CAP was performed at that time which demonstrated: no significant change in size of the pancreatic head mass, measuring 3.0 cm (previously measuring 4.0 cm); no significant change of the main pancreatic duct dilation measuring up to 1.2 cm with downstream pancreatic atrophy; bilary patency with the common bile duct stent in place; a decrease in size of one of the RLL pulmonary nodules, measuring 7 mm, previously measuring 9 mm, but an increase in size of the paravertebral RLL nodule measuring 6 mm, previously measuring 4 mm. The other (third) RLL nodule otherwise appeared stable in size, measuring 7 mm. His symptoms ultimately improved on their own in the ED without intervention and he opted for discharge. Of note: a chest x-ray was also performed in the  ED which was unremarkable.   He was also evaluated by Dr. Benjie at the [facility] on 10/14 for consideration of clinical trial participation. Other treatment options were also discussed at that  time (pending clinical trial placement) and he agreed to try capecitabine for maintenance therapy. He is tolerating this relatively well; he is scheduled to receive his next cycle of maintenance therapy next week at [facility].   ***   Allergies:  is allergic to metformin  and related, cheese, and milk-related compounds.  Meds: Current Outpatient Medications  Medication Sig Dispense Refill   Accu-Chek Softclix Lancets lancets Use to test blood glucose once daily 100 each 3   amLODipine  (NORVASC ) 10 MG tablet Take 1 tablet (10 mg total) by mouth daily. 90 tablet 3   Cholecalciferol (VITAMIN D3) 50 MCG (2000 UT) CAPS Take 2,000 Units by mouth with breakfast, with lunch, and with evening meal.     Continuous Glucose Sensor (FREESTYLE LIBRE 3 PLUS SENSOR) MISC Change sensor every 15 days. 2 each 6   glucose blood (EMBRACE PRO GLUCOSE TEST) test strip Use as instructed 100 each 12   insulin  aspart (NOVOLOG  FLEXPEN) 100 UNIT/ML FlexPen 2-3 units with meals 3 times a day plus sliding scale as instructed, maximum 20units/day 15 mL 11   insulin  glargine (LANTUS  SOLOSTAR) 100 UNIT/ML Solostar Pen INJECT 20 UNITS INTO THE SKIN 2 TIMES A DAY 12 mL 0   Insulin  Pen Needle (DROPLET PEN NEEDLES) 31G X 6 MM MISC Use to check blood sugar TID 100 each 0   Insulin  Pen Needle (EMBECTA PEN NEEDLE NANO 2 GEN) 32G X 4 MM MISC USE TO INJECT INSULIN  FOUR TIMES A DAY 300 each 2   lipase/protease/amylase (CREON ) 36000 UNITS CPEP capsule Take 2 capsules (72,000 Units total) by mouth 3 (three) times daily before meals.     pantoprazole  (PROTONIX ) 40 MG tablet Take 1 tablet (40 mg total) by mouth 3 (three) times daily.     saccharomyces boulardii (FLORASTOR) 250 MG capsule Take 250 mg by mouth 2 (two) times daily.     No current facility-administered medications for this encounter.    Physical Findings: The patient is in no acute distress. Patient is alert and oriented.  vitals were not taken for this visit. .  No significant  changes. Lungs are clear to auscultation bilaterally. Heart has regular rate and rhythm. No palpable cervical, supraclavicular, or axillary adenopathy. Abdomen soft, non-tender, normal bowel sounds.   Lab Findings: Lab Results  Component Value Date   WBC 4.7 10/14/2023   HGB 11.5 (L) 10/14/2023   HCT 34.6 (L) 10/14/2023   MCV 95.1 10/14/2023   PLT 166 10/14/2023    Radiographic Findings: CT ABDOMEN PELVIS W CONTRAST Result Date: 10/14/2023 EXAM: CT ABDOMEN AND PELVIS WITH CONTRAST 10/14/2023 12:54:54 PM TECHNIQUE: CT of the abdomen and pelvis was performed with the administration of 75 mL of iohexol  (OMNIPAQUE ) 350 MG/ML injection. Multiplanar reformatted images are provided for review. Automated exposure control, iterative reconstruction, and/or weight-based adjustment of the mA/kV was utilized to reduce the radiation dose to as low as reasonably achievable. COMPARISON: 09/15/2023 CLINICAL HISTORY: Epigastric pain. Substernal chest pain that radiated to his back, 1st episode was at 0930 and occurred while bending down to pick a green pepper from garden. Resolved after that but then had another episode happened while just sitting down. Hx pancreatic cancer; similar to his episodes when his pancreatic stent gets clogged. Denies SOB n/v/sweating. FINDINGS: LOWER CHEST: Right lower lobe lung nodule  measures 0.7 cm, image 15/5. Previously this measured 0.9 cm. The other previously noted right lower lobe lung nodule measures 7 mm, as of 12/05. Unchanged from the previous exam. Similar scarring within the posterior right lower lobe. 2 small nodules within the left lower lobe are unchanged as well. Paravertebral nodule within the right lower lobe measures 6 mm, not imaged on the most recent previous exam. On study from 10/04/2022, this measured 4 mm. LIVER: There is no suspicious liver lesion identified. GALLBLADDER AND BILE DUCTS: Common bile duct stent is in place. Pneumobilia is identified, compatible  with biliary patency. Air is also noted filling the gallbladder. SPLEEN: Within normal limits in size and appearance. PANCREAS: Soft tissue mass involving the head of the pancreas measuring 3.9 cm versus 4 cm previously. Marked dilatation of the main duct involving the neck, body, and tail of pancreas is unchanged measuring 1.2 cm. Atrophy of the pancreatic parenchyma from the neck through the tail is also unchanged. ADRENAL GLANDS: Normal size and morphology bilaterally. No nodule, thickening, or hemorrhage. No periadrenal stranding. KIDNEYS, URETERS AND BLADDER: Exophytic, Bosniak class 1 cyst arises off the posterior left kidney measuring 3.3 cm, image 28/3. No follow-up imaging recommended. No stones in the kidneys or ureters. No hydronephrosis. No perinephric or periureteral stranding. Urinary bladder is unremarkable. GI AND BOWEL: Stomach is unremarkable. No pathologic dilatation of the bowel loops. The appendix is visualized and normal in caliber, without wall thickening, periappendiceal inflammation, or fluid. Sigmoid colon demonstrates diverticulosis without evidence of diverticulitis. No bowel wall thickening, pericolonic stranding, abscess, or free air. Moderate retained stool in the rectum. PERITONEUM AND RETROPERITONEUM: No ascites or focal fluid collection. No peritoneal nodularity or mass. VASCULATURE: Aorta is normal in caliber. Aortic atherosclerotic calcification. Perigastric varicosities identified. LYMPH NODES: No abdominal or pelvic lymph nodes. REPRODUCTIVE ORGANS: No acute abnormality. BONES AND SOFT TISSUES: Unchanged fat-containing right inguinal hernia. No acute osseous abnormality. No focal soft tissue abnormality. IMPRESSION: 1. No acute findings. 2. Grossly unchanged pancreatic head mass measuring 3.9 cm (previously 4.0 cm). 3. Unchanged marked main pancreatic duct dilation to 1.2 cm with downstream pancreatic atrophy. 4. Common bile duct stent in place with pneumobilia, compatible with  biliary patency. 5. Right lower lobe pulmonary nodules: one decreased to 7 mm from 9 mm; another stable at 7 mm; paravertebral right lower lobe nodule increased to 6 mm from 4 mm (since 10/04/2022). 6. Aortic atherosclerotic calcification. Electronically signed by: Waddell Calk MD 10/14/2023 02:07 PM EDT RP Workstation: [id number]   DG Chest 2 View Result Date: 10/14/2023 CLINICAL DATA:  Chest pain, history of pancreas cancer EXAM: CHEST - 2 VIEW COMPARISON:  09/15/2023 FINDINGS: Normal heart size and vascularity. Right IJ power port catheter tip SVC RA junction, unchanged. Similar scarring and postsurgical changes in the right lung base. No superimposed acute airspace process, pneumonia, collapse or consolidation. No large effusion or pneumothorax. Trachea midline. Aorta atherosclerotic. Degenerative changes noted spine. Biliary stent partially imaged in the upper abdomen. IMPRESSION: Stable chest exam. No interval change or acute process by plain radiography. Electronically Signed   By: CHRISTELLA.  Shick M.D.   On: 10/14/2023 11:04    Impression: Adenocarcinoma of the pancreas-stage Ib (T2N0M0); BRCA (-), KRAS (+), HRD(-), now with metastatic disease to the right lower lung: s/p neoadjuvant chemotherapy followed by RLL wedge resection  The patient is recovering from the effects of radiation.  ***  Plan:  ***   *** minutes of total time was spent for this patient encounter, including  preparation, face-to-face counseling with the patient and coordination of care, physical exam, and documentation of the encounter. ____________________________________  Lynwood CHARM Nasuti, PhD, MD  This document serves as a record of services personally performed by Lynwood Nasuti, MD. It was created on his behalf by Dorthy Fuse, a trained medical scribe. The creation of this record is based on the scribe's personal observations and the provider's statements to them. This document has been checked and approved by the attending  provider.

## 2023-11-12 ENCOUNTER — Ambulatory Visit
Admission: RE | Admit: 2023-11-12 | Discharge: 2023-11-12 | Disposition: A | Discharge status: Home / Self Care | Source: Ambulatory Visit | Attending: Radiation Oncology | Admitting: Radiation Oncology

## 2023-11-12 DIAGNOSIS — Z794 Long term (current) use of insulin: Secondary | ICD-10-CM | POA: Insufficient documentation

## 2023-11-12 DIAGNOSIS — Z9221 Personal history of antineoplastic chemotherapy: Secondary | ICD-10-CM | POA: Diagnosis not present

## 2023-11-12 DIAGNOSIS — Z923 Personal history of irradiation: Secondary | ICD-10-CM | POA: Insufficient documentation

## 2023-11-12 DIAGNOSIS — I700 Atherosclerosis of aorta: Secondary | ICD-10-CM | POA: Diagnosis not present

## 2023-11-12 DIAGNOSIS — R432 Parageusia: Secondary | ICD-10-CM | POA: Insufficient documentation

## 2023-11-12 DIAGNOSIS — C250 Malignant neoplasm of head of pancreas: Secondary | ICD-10-CM | POA: Diagnosis present

## 2023-11-12 DIAGNOSIS — K838 Other specified diseases of biliary tract: Secondary | ICD-10-CM | POA: Insufficient documentation

## 2023-11-12 DIAGNOSIS — Z79899 Other long term (current) drug therapy: Secondary | ICD-10-CM | POA: Diagnosis not present

## 2023-11-12 DIAGNOSIS — C7801 Secondary malignant neoplasm of right lung: Secondary | ICD-10-CM | POA: Diagnosis not present

## 2023-11-12 DIAGNOSIS — R918 Other nonspecific abnormal finding of lung field: Secondary | ICD-10-CM | POA: Diagnosis not present

## 2023-11-12 HISTORY — DX: Personal history of irradiation: Z92.3

## 2023-11-12 NOTE — Progress Notes (Signed)
 Alexander [name] is here today for follow up post radiation to the abdomen  They completed their radiation on: 10/12/23   Does the patient complain of any of the following:  Pain:Denies Abdominal bloating: Denies Diarrhea/Constipation: Denies Nausea/Vomiting: Denies Urinary Issues (dysuria/incomplete emptying/ incontinence/ increased frequency/urgency): Denies Post radiation skin changes: Denies  BP (P) 119/75 (BP Location: Left Arm, Patient Position: Sitting)   Pulse (P) 64   Temp (!) (P) 97.1 F (36.2 C) (Temporal)   Resp (P) 18   Ht (P) 5' 11 (1.803 m)   Wt (P) 145 lb 4 oz (65.9 kg)   SpO2 (P) 100%   BMI (P) 20.26 kg/m     Additional comments if applicable: None

## 2023-11-13 ENCOUNTER — Ambulatory Visit

## 2023-11-13 ENCOUNTER — Encounter: Payer: Self-pay | Admitting: *Deleted

## 2023-11-13 ENCOUNTER — Inpatient Hospital Stay: Attending: Hematology & Oncology

## 2023-11-13 ENCOUNTER — Inpatient Hospital Stay

## 2023-11-13 ENCOUNTER — Encounter: Payer: Self-pay | Admitting: Hematology & Oncology

## 2023-11-13 ENCOUNTER — Inpatient Hospital Stay (HOSPITAL_BASED_OUTPATIENT_CLINIC_OR_DEPARTMENT_OTHER): Admitting: Hematology & Oncology

## 2023-11-13 VITALS — BP 122/70 | HR 62 | Temp 97.9°F | Resp 18 | Ht 71.0 in | Wt 145.0 lb

## 2023-11-13 DIAGNOSIS — Z79899 Other long term (current) drug therapy: Secondary | ICD-10-CM | POA: Insufficient documentation

## 2023-11-13 DIAGNOSIS — C259 Malignant neoplasm of pancreas, unspecified: Secondary | ICD-10-CM | POA: Diagnosis present

## 2023-11-13 DIAGNOSIS — C7800 Secondary malignant neoplasm of unspecified lung: Secondary | ICD-10-CM | POA: Diagnosis present

## 2023-11-13 DIAGNOSIS — C250 Malignant neoplasm of head of pancreas: Secondary | ICD-10-CM

## 2023-11-13 DIAGNOSIS — D45 Polycythemia vera: Secondary | ICD-10-CM | POA: Diagnosis not present

## 2023-11-13 LAB — CMP (CANCER CENTER ONLY)
ALT: 16 U/L (ref 0–44)
AST: 22 U/L (ref 15–41)
Albumin: 3.7 g/dL (ref 3.5–5.0)
Alkaline Phosphatase: 186 U/L — ABNORMAL HIGH (ref 38–126)
Anion gap: 11 (ref 5–15)
BUN: 20 mg/dL (ref 8–23)
CO2: 26 mmol/L (ref 22–32)
Calcium: 9.1 mg/dL (ref 8.9–10.3)
Chloride: 106 mmol/L (ref 98–111)
Creatinine: 0.96 mg/dL (ref 0.61–1.24)
GFR, Estimated: >60 mL/min (ref >60)
Glucose, Bld: 98 mg/dL (ref 70–99)
Potassium: 3.9 mmol/L (ref 3.5–5.1)
Sodium: 143 mmol/L (ref 135–145)
Total Bilirubin: 0.5 mg/dL (ref 0.0–1.2)
Total Protein: 6.4 g/dL — ABNORMAL LOW (ref 6.5–8.1)

## 2023-11-13 LAB — CBC WITH DIFFERENTIAL (CANCER CENTER ONLY)
Abs Immature Granulocytes: 0.03 K/uL (ref 0.00–0.07)
Basophils Absolute: 0 K/uL (ref 0.0–0.1)
Basophils Relative: 0 %
Eosinophils Absolute: 0.2 K/uL (ref 0.0–0.5)
Eosinophils Relative: 3 %
HCT: 32 % — ABNORMAL LOW (ref 39.0–52.0)
Hemoglobin: 10.6 g/dL — ABNORMAL LOW (ref 13.0–17.0)
Immature Granulocytes: 0 %
Lymphocytes Relative: 13 %
Lymphs Abs: 0.9 K/uL (ref 0.7–4.0)
MCH: 32.8 pg (ref 26.0–34.0)
MCHC: 33.1 g/dL (ref 30.0–36.0)
MCV: 99.1 fL (ref 80.0–100.0)
Monocytes Absolute: 0.8 K/uL (ref 0.1–1.0)
Monocytes Relative: 11 %
Neutro Abs: 5 K/uL (ref 1.7–7.7)
Neutrophils Relative %: 73 %
Platelet Count: 204 K/uL (ref 150–400)
RBC: 3.23 MIL/uL — ABNORMAL LOW (ref 4.22–5.81)
RDW: 17.5 % — ABNORMAL HIGH (ref 11.5–15.5)
WBC Count: 6.9 K/uL (ref 4.0–10.5)
nRBC: 0 % (ref 0.0–0.2)

## 2023-11-13 NOTE — Progress Notes (Unsigned)
 Patient here for follow up, but currently under active treatment with [facility]. We will continue periodic follow while [facility] is treating.   Oncology Nurse Navigator Documentation     11/13/2023   10:45 AM  Oncology Nurse Navigator Flowsheets  Navigator Location [facility]  Navigator Encounter Type Follow-up Appt  Patient Visit Type MedOnc  Treatment Phase Active Tx  Barriers/Navigation Needs No Barriers At This Time  Interventions None Required  Acuity Level 1-No Barriers  Support Groups/Services Friends and Family  Time Spent with Patient 15

## 2023-11-13 NOTE — Progress Notes (Unsigned)
 Is been a little Hematology and Oncology Follow Up Visit  Alexander [name] [id number] [birth year] 83 y.o. 11/13/2023  Principle Diagnosis:  Adenocarcinoma of the pancreas-stage Ib (T2N0M0) --clinical stage --BRCA (-), KRAS (+), HRD(-) --metastatic to lung   Current Therapy:        Status post biliary stent placement FOLFIRINOX -- Neoadj - s/p cycle #12/12  - start on 10/31/2022 --completed on 05/08/2023 XRT + Gemzar  -- start on 08/27/2023   - s/p 21/25 fractions of XRT    Interim History:  Alexander [name] is here today for follow-up.  He is currently on radiation and chemotherapy.  He really is having a hard time.  As such, I really think that we have to hold on his chemotherapy.  He can continue the radiation therapy.  I am not sure if the Gemzar  is just making him sick.  He says is not able to eat.  He says food tastes horrible.  He really has not been able to keep down that much because of poor taste.  He looks really is dehydrated.  We really need to give him some IV fluids.  Will get some fluids today.  Will see about giving him some fluids tomorrow.  He apparently was accepted for an evaluation into a clinical trial at [facility].  This is apparently a novel trial utilizing messenger RNA.  I would be fascinated to see how this would work.  Hopefully, he will be excepted.  He has had no problems with pain.  He has had no problems with bowels or bladder.  Has had no diarrhea.  He has had no bleeding.  He has had no leg swelling.  His last CA 19-9 was actually much better at 38.  He has had no fever.  He has had no mouth sores.  Overall, I would have to say that his performance status is probably ECOG 2.   Wt Readings from Last 3 Encounters:  11/13/23 145 lb (65.8 kg)  11/12/23 (P) 145 lb 4 oz (65.9 kg)  10/14/23 141 lb (64 kg)   Medications:  Allergies as of 11/13/2023       Reactions   Metformin  And Related Diarrhea   Cheese Diarrhea   Allergy to soft cheese, can have hard cheese    Milk-related Compounds Diarrhea        Medication List        Accurate as of November 13, 2023  9:52 AM. If you have any questions, ask your nurse or doctor.          Accu-Chek Softclix Lancets lancets Use to test blood glucose once daily   amLODipine  10 MG tablet Commonly known as: NORVASC  Take 1 tablet (10 mg total) by mouth daily.   capecitabine 500 MG tablet Commonly known as: XELODA Take 3 tablets (1500 mg) in AM and 3 tablets (1500 mg) in PM (12 hours apart) by mouth with water AFTER meal days 1-14. OFF days 15-21 What changed: See the new instructions.   chlorhexidine  0.12 % solution Commonly known as: PERIDEX    Droplet Pen Needles 31G X 6 MM Misc Generic drug: Insulin  Pen Needle Use to check blood sugar TID   Embecta Pen Needle Nano 2 Gen 32G X 4 MM Misc Generic drug: Insulin  Pen Needle USE TO INJECT INSULIN  FOUR TIMES A DAY   Embrace Pro Glucose Test test strip Generic drug: glucose blood Use as instructed   FreeStyle Libre 3 Plus Sensor Misc Change sensor every 15 days.  Lantus  SoloStar 100 UNIT/ML Solostar Pen Generic drug: insulin  glargine INJECT 20 UNITS INTO THE SKIN 2 TIMES A DAY What changed: additional instructions   lipase/protease/amylase 63999 UNITS Cpep capsule Commonly known as: Creon  Take 2 capsules (72,000 Units total) by mouth 3 (three) times daily before meals. What changed:  how much to take additional instructions   NovoLOG  FlexPen 100 UNIT/ML FlexPen Generic drug: insulin  aspart 2-3 units with meals 3 times a day plus sliding scale as instructed, maximum 20units/day What changed: additional instructions   ondansetron  8 MG tablet Commonly known as: ZOFRAN  Take by mouth.   pantoprazole  40 MG tablet Commonly known as: PROTONIX  Take 1 tablet (40 mg total) by mouth 3 (three) times daily.   prochlorperazine  10 MG tablet Commonly known as: COMPAZINE  Take 10 mg by mouth every 6 (six) hours.   saccharomyces boulardii  250 MG capsule Commonly known as: FLORASTOR Take 250 mg by mouth 2 (two) times daily.   vitamin D3 50 MCG (2000 UT) Caps Take 2,000 Units by mouth with breakfast, with lunch, and with evening meal.        Allergies:  Allergies  Allergen Reactions  . Metformin  And Related Diarrhea  . Cheese Diarrhea    Allergy to soft cheese, can have hard cheese  . Milk-Related Compounds Diarrhea    Past Medical History, Surgical history, Social history, and Family History were reviewed and updated.  Review of Systems:  Review of Systems  Constitutional: Negative.   HENT: Negative.    Eyes: Negative.   Respiratory: Negative.    Cardiovascular: Negative.   Gastrointestinal: Negative.   Genitourinary: Negative.   Musculoskeletal: Negative.   Skin: Negative.   Neurological: Negative.   Endo/Heme/Allergies: Negative.   Psychiatric/Behavioral: Negative.       Physical Exam:  height is 5' 11 (1.803 m) and weight is 145 lb (65.8 kg). His oral temperature is 97.9 F (36.6 C). His blood pressure is 122/70 and his pulse is 62. His respiration is 18 and oxygen saturation is 100%.   Wt Readings from Last 3 Encounters:  11/13/23 145 lb (65.8 kg)  11/12/23 (P) 145 lb 4 oz (65.9 kg)  10/14/23 141 lb (64 kg)    Physical Exam Vitals reviewed.  HENT:     Head: Normocephalic and atraumatic.  Eyes:     Pupils: Pupils are equal, round, and reactive to light.  Cardiovascular:     Rate and Rhythm: Normal rate and regular rhythm.     Heart sounds: Normal heart sounds.  Pulmonary:     Effort: Pulmonary effort is normal.     Breath sounds: Normal breath sounds.  Abdominal:     General: Bowel sounds are normal.     Palpations: Abdomen is soft.  Musculoskeletal:        General: No tenderness or deformity. Normal range of motion.     Cervical back: Normal range of motion.  Lymphadenopathy:     Cervical: No cervical adenopathy.  Skin:    General: Skin is warm and dry.     Findings: No  erythema or rash.  Neurological:     Mental Status: He is alert and oriented to person, place, and time.  Psychiatric:        Behavior: Behavior normal.        Thought Content: Thought content normal.        Judgment: Judgment normal.      Lab Results  Component Value Date   WBC 6.9 11/13/2023   HGB  10.6 (L) 11/13/2023   HCT 32.0 (L) 11/13/2023   MCV 99.1 11/13/2023   PLT 204 11/13/2023   Lab Results  Component Value Date   FERRITIN 165 10/02/2023   IRON 114 10/02/2023   TIBC 270 10/02/2023   UIBC 156 10/02/2023   IRONPCTSAT 42 (H) 10/02/2023   Lab Results  Component Value Date   RETICCTPCT 0.7 08/30/2022   RBC 3.23 (L) 11/13/2023   RETICCTABS 32.6 12/19/2013   No results found for: KPAFRELGTCHN, LAMBDASER, KAPLAMBRATIO No results found for: IGGSERUM, IGA, IGMSERUM No results found for: STEPHANY CARLOTA BENSON MARKEL EARLA JOANNIE DOC, MSPIKE, SPEI   Chemistry      Component Value Date/Time   NA 137 10/14/2023 1042   NA 144 11/29/2016 1054   NA 139 12/29/2015 0954   K 4.8 10/14/2023 1042   K 4.2 11/29/2016 1054   K 4.1 12/29/2015 0954   CL 101 10/14/2023 1042   CL 102 11/29/2016 1054   CO2 23 10/14/2023 1042   CO2 30 11/29/2016 1054   CO2 26 12/29/2015 0954   BUN 13 10/14/2023 1042   BUN 22 11/29/2016 1054   BUN 20.0 12/29/2015 0954   CREATININE 1.06 10/14/2023 1042   CREATININE 1.18 10/09/2023 1058   CREATININE 1.2 11/29/2016 1054   CREATININE 1.3 12/29/2015 0954      Component Value Date/Time   CALCIUM  9.0 10/14/2023 1042   CALCIUM  9.3 11/29/2016 1054   CALCIUM  9.5 12/29/2015 0954   ALKPHOS 156 (H) 10/14/2023 1042   ALKPHOS 92 (H) 11/29/2016 1054   ALKPHOS 98 12/29/2015 0954   AST 71 (H) 10/14/2023 1042   AST 21 10/09/2023 1058   AST 16 12/29/2015 0954   ALT 28 10/14/2023 1042   ALT 20 10/09/2023 1058   ALT 20 11/29/2016 1054   ALT 13 12/29/2015 0954   BILITOT 1.5 (H) 10/14/2023 1042   BILITOT 0.9  10/09/2023 1058   BILITOT 0.71 12/29/2015 0954        Impression and Plan:  Mr. Dickison is a pleasant 83 yo gentleman with original diagnosis of  polycythemia vera and early stage pancreatic cancer. He was originally treated with neoadjuvant FOLFIRINOX but unfortunately he had progressive disease.  As such, he was not able to have surgery to resect out the pancreatic mass.   Again, we will stop the chemotherapy.  This started can be causing his poor taste.  He will continue the radiotherapy.  I think he finishes this up in a week or so.  I probably would not do any scans on him to see how well he has responded for about 6 weeks.  I am sure that if he gets excepted into this clinical trial at [facility], they will help manage x-rays.  He will come in tomorrow for some IV fluid.  He will get IV fluid today.  Hopefully, we can get his taste back so we can start to enjoy food again.  I would like to see him back myself probably in about 3 to 4 weeks.   Maude JONELLE Crease, MD 11/11/20259:52 AM

## 2023-11-13 NOTE — Patient Instructions (Signed)

## 2023-11-14 ENCOUNTER — Encounter: Payer: Self-pay | Admitting: Hematology & Oncology

## 2023-11-14 ENCOUNTER — Ambulatory Visit: Payer: Self-pay | Admitting: Hematology & Oncology

## 2023-11-14 ENCOUNTER — Ambulatory Visit: Admitting: Endocrinology

## 2023-11-14 LAB — CANCER ANTIGEN 19-9: CA 19-9: 24 U/mL (ref 0–35)

## 2023-11-15 ENCOUNTER — Encounter: Payer: Self-pay | Admitting: *Deleted

## 2023-11-15 ENCOUNTER — Encounter: Payer: Self-pay | Admitting: Hematology & Oncology

## 2023-11-15 NOTE — Progress Notes (Signed)
 Dr Timmy would like patient to be able to get his labs drawn between visits at this office. He requests I coordinate this with [facility].   Called Dr [employer] office and spoke to Triage [employer]. She states patient has standing orders for CBC and CMP. She will speak with Dr Benjie and return my call.   Received call back from [facility]. Patient is only scheduled for labs every three weeks and he is also scheduled a provider visit at this same time. As such, at this time he will need to continue with [facility]. If anything changes in the future, they will reach out.   Dr Timmy notified.   Oncology Nurse Navigator Documentation     11/15/2023    8:00 AM  Oncology Nurse Navigator Flowsheets  Navigator Follow Up Date: 12/24/2023  Navigator Follow Up Reason: Follow-up Appointment  Navigator Location [facility]  Navigator Encounter Type Telephone  Telephone Outgoing Call  Patient Visit Type MedOnc  Treatment Phase Active Tx  Barriers/Navigation Needs Coordination of Care  Interventions Coordination of Care  Acuity Level 1-No Barriers  Coordination of Care Other  Support Groups/Services Friends and Family  Time Spent with Patient 30

## 2023-11-26 ENCOUNTER — Telehealth: Payer: Self-pay | Admitting: *Deleted

## 2023-11-26 NOTE — Telephone Encounter (Signed)
 Patient called wanting to set up follow up lab appt here so he doesn't have to go to [facility] just for blood work.  According to [facility] note on 11.18.25, patient is to come back in 3 weeks to follow up with Dr Timmy and clarene.  Scheduling message sent and patient notified

## 2023-12-03 ENCOUNTER — Other Ambulatory Visit: Payer: Self-pay

## 2023-12-03 ENCOUNTER — Ambulatory Visit: Admitting: Endocrinology

## 2023-12-03 ENCOUNTER — Encounter: Payer: Self-pay | Admitting: Endocrinology

## 2023-12-03 ENCOUNTER — Ambulatory Visit: Payer: Self-pay | Admitting: Endocrinology

## 2023-12-03 VITALS — BP 100/62 | HR 54 | Resp 16 | Ht 71.0 in | Wt 145.8 lb

## 2023-12-03 DIAGNOSIS — Z794 Long term (current) use of insulin: Secondary | ICD-10-CM

## 2023-12-03 DIAGNOSIS — E1165 Type 2 diabetes mellitus with hyperglycemia: Secondary | ICD-10-CM

## 2023-12-03 LAB — POCT GLYCOSYLATED HEMOGLOBIN (HGB A1C): Hemoglobin A1C: 5.8 % — AB (ref 4.0–5.6)

## 2023-12-03 NOTE — Patient Instructions (Addendum)
 Diabetes regimingmen:   Lantus  12 units daily in the morning once a day only.  On chemo days okay to  take 15 units fro 3 days daily.  Humalog  5-6 units with breakfast, 5-6 units with lunch and 5-6 units with supper.  Take 4 units with breakfast and lunch on swimming days.  If your meal has added carbohydrates take up to 8 units with meal.  Take Humalog  10 to 15 minutes before eating if possible.  Use the following sliding scale if blood sugar is high before eating to give additional insulin  based on sliding scale for the meals.  Mild Sliding Scale Blood Glucose        Insulin  60-150                     None 151-200                   1 unit 201-250                   2 units 251-300                   4 units 301-350                   6 units 351-400                   8 units      >400                        9 units and call provider

## 2023-12-03 NOTE — Patient Outreach (Signed)
 Complex Care Management   Visit Note  12/03/2023  Name:  Alexander [name] MRN: [id number] DOB: [birth year]  Situation: Referral received for Complex Care Management related to Diabetes with Complications and Ongoing pancreatic Cancer treatment. I obtained verbal consent from Patient.  Visit completed with Patient  on the phone  Background:   Past Medical History:  Diagnosis Date   Arthritis    minor; shoulders primarily (05/20/2013)   CAD (coronary artery disease)    a. 05/2013 - Chest pain/unstable angina and dynamic EKG changes showing cath showing atherosclerotic coronary artery disease manifested as diffuse ectasia, normal EF.   Diverticulosis of colon    GERD (gastroesophageal reflux disease)    GI bleed    secondary to Aspirin    Gout    Hematuria, microscopic    Hemorrhoids    History of radiation therapy    Abdomen- 08/29/23-10/12/23- Dr. Lynwood Nasuti   Hyperglycemia    Hyperlipidemia    Hypertension    Lumbar back pain    Microalbuminuria    Overweight(278.02)    Pancreatic cancer (HCC) 09/22/2022   Polycythemia rubra vera (HCC)    Tubular adenoma of colon 07/2012    Assessment: Patient Reported Symptoms:  Cognitive Cognitive Status: No symptoms reported, Normal speech and language skills, Insightful and able to interpret abstract concepts, Alert and oriented to person, place, and time Cognitive/Intellectual Conditions Management [RPT]: None reported or documented in medical history or problem list   Health Maintenance Behaviors: Healthy diet, Stress management, Annual physical exam, Social activities, Hobbies Healing Pattern: Average Health Facilitated by: Healthy diet, Rest, Stress management  Neurological Neurological Review of Symptoms: Weakness Neurological Management Strategies: Coping strategies, Adequate rest, Routine screening Neurological Self-Management Outcome: 4 (good)  HEENT HEENT Symptoms Reported: No symptoms reported      Cardiovascular  Cardiovascular Symptoms Reported: No symptoms reported Does patient have uncontrolled Hypertension?: No Cardiovascular Self-Management Outcome: 4 (good)  Respiratory Respiratory Symptoms Reported: No symptoms reported    Endocrine Is patient diabetic?: Yes Is patient checking blood sugars at home?: Yes List most recent blood sugar readings, include date and time of day: most recent A1c = 5.8; saw Endocrinologist Dr. Mercie today, 12/03/23 and MD suggested patient cut back on insulin  dosage Endocrine Self-Management Outcome: 5 (very good)  Gastrointestinal Gastrointestinal Symptoms Reported: Other Other Gastrointestinal Symptoms: Reports good appetite despite altered taste. Remains on Chemo w/ dexamethasone  Additional Gastrointestinal Details: Being treated for pancreatic cancer by Dr. Timmy Gastrointestinal Management Strategies: Medication therapy, Diet modification, Adequate rest, Coping strategies Gastrointestinal Self-Management Outcome: 3 (uncertain)    Genitourinary Genitourinary Symptoms Reported: No symptoms reported    Integumentary Integumentary Symptoms Reported: No symptoms reported    Musculoskeletal Musculoskelatal Symptoms Reviewed: No symptoms reported        Psychosocial Psychosocial Symptoms Reported: No symptoms reported     Quality of Family Relationships: supportive, involved, helpful Do you feel physically threatened by others?: No    12/03/2023    PHQ2-9 Depression Screening   Little interest or pleasure in doing things Not at all  Feeling down, depressed, or hopeless Not at all  PHQ-2 - Total Score 0  Trouble falling or staying asleep, or sleeping too much    Feeling tired or having little energy    Poor appetite or overeating     Feeling bad about yourself - or that you are a failure or have let yourself or your family down    Trouble concentrating on things, such as reading the newspaper or watching television  Moving or speaking so slowly that other  people could have noticed.  Or the opposite - being so fidgety or restless that you have been moving around a lot more than usual    Thoughts that you would be better off dead, or hurting yourself in some way    PHQ2-9 Total Score    If you checked off any problems, how difficult have these problems made it for you to do your work, take care of things at home, or get along with other people    Depression Interventions/Treatment      There were no vitals filed for this visit.    Medications Reviewed Today     Reviewed by Gordy Channing LABOR, RN (Registered Nurse) on 12/03/23 at 1545  Med List Status: <None>   Medication Order Taking? Sig Documenting Provider Last Dose Status Informant  Accu-Chek Softclix Lancets lancets [id number] Yes Use to test blood glucose once daily Nafziger, Darleene, NP  Active Self, Pharmacy Records  amLODipine  (NORVASC ) 10 MG tablet [id number] Yes Take 1 tablet (10 mg total) by mouth daily. Nafziger, Darleene, NP  Active Self, Pharmacy Records  capecitabine (XELODA) 500 MG tablet [id number] Yes Take 3 tablets (1500 mg) in AM and 3 tablets (1500 mg) in PM (12 hours apart) by mouth with water AFTER meal days 1-14. OFF days 15-21 [provider]  Active   chlorhexidine  (PERIDEX ) 0.12 % solution [id number] Yes  [provider]  Active   Cholecalciferol (VITAMIN D3) 50 MCG (2000 UT) CAPS [id number] Yes Take 2,000 Units by mouth with breakfast, with lunch, and with evening meal. [provider]  Active Self, Pharmacy Records  Continuous Glucose Sensor (FREESTYLE LIBRE 3 PLUS SENSOR) MISC [id number] Yes Change sensor every 15 days. Nafziger, Darleene, NP  Active   glucose blood (EMBRACE PRO GLUCOSE TEST) test strip [id number] Yes Use as instructed Nafziger, Cory, NP  Active Self, Pharmacy Records  insulin  aspart (NOVOLOG  FLEXPEN) 100 UNIT/ML FlexPen [id number] Yes 2-3 units with meals 3 times a day plus sliding scale as instructed, maximum 20units/day  Patient taking  differently: 4-5 units with meals 3 times a day plus sliding scale as instructed, maximum 20units/day   Thapa, Sudan, MD  Active Self, Pharmacy Records  insulin  glargine (LANTUS  SOLOSTAR) 100 UNIT/ML Solostar Pen [id number] Yes INJECT 20 UNITS INTO THE SKIN 2 TIMES A DAY  Patient taking differently: 11/13/2023  INJECT 18 UNITS INTO THE SKIN daily.   Merna Darleene, NP  Active Self, Pharmacy Records           Med Note RENNIS, DUROJAHYE' R   Sat Sep 15, 2023  9:56 AM) Changes on days where he has chemo  Insulin  Pen Needle (DROPLET PEN NEEDLES) 31G X 6 MM MISC [id number] Yes Use to check blood sugar TID Nafziger, Cory, NP  Active Self, Pharmacy Records  Insulin  Pen Needle (EMBECTA PEN NEEDLE NANO 2 GEN) 32G X 4 MM MISC [id number] Yes USE TO INJECT INSULIN  FOUR TIMES A DAY Thapa, Sudan, MD  Active   lipase/protease/amylase (CREON ) 36000 UNITS CPEP capsule [id number] Yes Take 2 capsules (72,000 Units total) by mouth 3 (three) times daily before meals.  Patient taking differently: Take by mouth 3 (three) times daily before meals. 11/13/2023 Takes 2 capsules with breakfast and lunch, 3 capsules with dinner and 1 capsule with snack.   Samtani, Jai-Gurmukh, MD  Active   ondansetron  (ZOFRAN ) 8 MG tablet [id number] Yes Take by mouth. [provider]  Active   pantoprazole  (PROTONIX ) 40 MG  tablet [id number] Yes Take 1 tablet (40 mg total) by mouth 3 (three) times daily. Samtani, Jai-Gurmukh, MD  Active   prochlorperazine  (COMPAZINE ) 10 MG tablet [id number] Yes Take 10 mg by mouth every 6 (six) hours. [provider]  Active   saccharomyces boulardii (FLORASTOR) 250 MG capsule [id number] Yes Take 250 mg by mouth 2 (two) times daily. [provider]  Active Self, Pharmacy Records            Recommendation:   Continue Current Plan of Care  Follow Up Plan:   Telephone follow up appointment date/time:  12/24/2023 at 3pm with RN Care Manager Channing LITTIE Channing A. Gordy RN, BA,  [facility], CRRN [place]  [facility] Population Health RN Care Manager Direct Dial : [phone]  Fax: [phone]

## 2023-12-03 NOTE — Patient Instructions (Signed)
 Visit Information  Thank you for taking time to visit with me today. Please don't hesitate to contact me if I can be of assistance to you before our next scheduled telephone appointment.  Our next appointment is by telephone on 12/24/2023 at 3pm.  Following is a copy of your care plan:   Goals Addressed             This Visit's Progress    VBCI RN Care Plan       Problems:  Chronic Disease Management support and education needs related to DMII and Pacreatic Cancer which patient receives treatment from [facility] w/ chemotherapy & dexamenthasone  Goal: Over the next 4 months the Patient will continue to work with RN Care Manager and/or Social Worker to address care management and care coordination needs related to DMII and Pancreatic Cancer as evidenced by adherence to care management team scheduled appointments      Interventions:   Diabetes Interventions: Assessed patient's understanding of A1c goal: <7% Provided education to patient about basic DM disease process Reviewed medications with patient and discussed importance of medication adherence Counseled on importance of regular laboratory monitoring as prescribed Discussed plans with patient for ongoing care management follow up and provided patient with direct contact information for care management team Review of patient status, including review of consultants reports, relevant laboratory and other test results, and medications completed Screening for signs and symptoms of depression related to chronic disease state  Assessed social determinant of health barriers Lab Results  Component Value Date   HGBA1C 5.8 (A) 12/03/2023    Oncology: Assessment of understanding of oncology diagnosis:  Assessed patient understanding of cancer diagnosis and recommended treatment plan Reviewed upcoming provider appointments and treatment appointments Assessed available transportation to appointments and treatments. Has  consistent/reliable transportation: Yes Assessed support system. Has consistent/reliable family or other support: Yes Collaboration/Communication with Cancer Navigator is followed by [facility]/Dr. Timmy PHQ2/PHQ9 performed  Patient Self-Care Activities:  Attend all scheduled provider appointments Attend church or other social activities Call pharmacy for medication refills 3-7 days in advance of running out of medications Call provider office for new concerns or questions  Participate in Transition of Care Program/Attend TOC scheduled calls Perform all self care activities independently  Perform IADL's (shopping, preparing meals, housekeeping, managing finances) independently Work with the child psychotherapist to address care coordination needs and will continue to work with the clinical team to address health care and disease management related needs Work with the pharmacist to address medication management needs and will continue to work with the clinical team to address health care and disease management related needs schedule appointment with eye doctor check feet daily for cuts, sores or redness  Plan:  The patient has been provided with contact information for the care management team and has been advised to call with any health related questions or concerns.  Next appointment with RNCM Channing L is scheduled for 12/24/23 at 3pm              Care plan and visit instructions communicated with the patient verbally today. Patient agrees to receive a copy in MyChart. Active MyChart status and patient understanding of how to access instructions and care plan via MyChart confirmed with patient.     The patient has been provided with contact information for the care management team and has been advised to call with any health related questions or concerns.   Please call the care guide team at [phone] if you need  to cancel or reschedule your appointment.   Please call  1-800-273-TALK (toll free, 24 hour hotline) if you are experiencing a Mental Health or Behavioral Health Crisis or need someone to talk to.  Wrangler Penning A. Gordy RN, BA, [facility], CRRN [place]  [facility] Health RN Care Manager Direct Dial : [phone]  Fax: [phone]

## 2023-12-03 NOTE — Progress Notes (Signed)
 Outpatient Endocrinology Note Melchizedek Espinola, MD  12/03/23  Patient's Name: Alexander [name]    DOB: 09/20/1940    MRN: [id number]                                                    REASON OF VISIT: Follow-up for type 2 diabetes mellitus  REFERRING PROVIDER: Merna Huxley, NP  PCP: Merna Huxley, NP  HISTORY OF PRESENT ILLNESS:   Alexander [name] is a 83 y.o. old male with past medical history listed below, is here for follow-up for type 2 diabetes mellitus.   Pertinent Diabetes History: Patient has type 2 diabetes mellitus, based on hemoglobin A1c 6.6% at least from 2008, was controlled with mostly hemoglobin A1c in the range of 5.6 to 6.4% for several years, mostly in prediabetes range on lifestyle modification.  In March 2024 he had hemoglobin A1c elevated to 8.1%, was started on glipizide  however later he stopped due to patient concern of weight loss after few weeks.  He had tried metformin  in the past for short time, stopped due to GI intolerance/diarrhea.  He was diagnosed with adenocarcinoma of pancreas in September 2024, is being treated with neoadjuvant chemotherapy, ? possible surgery in the future. During the course of pancreatic cancer treatment he was started on insulin  therapy in October 2024, and referred to endocrinology for further evaluation and management of type 2 diabetes mellitus, was initially seen in December 2024.  She has been seeing dietitian/nutritionist as well.  Family h/o diabetes mellitus: mother and father had type 2 diabetes mellitus.SABRA    He was started on Lantus  20 units 2 times a day and Humulin R  5 units with meals 3 times a day.  Lantus  evening dose was gradually decreased, was taking 10 units until 2 days ago and he stopped.  Insulin  doses has been adjusted over time.  Chronic Diabetes Complications : Retinopathy: no , reportedly. Last ophthalmology exam was done on annually.  Nephropathy: no Peripheral neuropathy: no Coronary artery  disease: no Stroke: no  Relevant comorbidities and cardiovascular risk factors: Obesity: no Body mass index is 20.33 kg/m.  Hypertension: Yes  Hyperlipidemia : Yes, on statin   Current / Home Diabetic regimen includes:  Lantus  18 units in the morning daily, on chemo days and 15 units on nonchemo days. Humalog  5-6 units with meals 3 times a day.   Prior diabetic medications: Glipizide , was treated in March 2024.  Metformin  had GI intolerance with diarrhea in the past. He had question about insulin  pump in the past, we discussed and plan to stay on multidose insulin  regimen.  Glycemic data:    CONTINUOUS GLUCOSE MONITORING SYSTEM (CGMS) INTERPRETATION: At today's visit, we reviewed CGM downloads. The full report is scanned in the media. Reviewing the CGM trends, blood glucose are as follows:  FreeStyle Libre 3 CGM-  Sensor Download (Sensor download was reviewed and summarized below.) Dates: November 18 - December 1 , 2025, 14 days Sensor Average: 142 Glucose Management Indicator: 6.7%     Interpretation: Mostly acceptable blood sugar.  Mostly trending down blood sugar by the morning in the low normal range.  Occasional hypoglycemia with blood sugar in 60s randomly in the afternoon and sometime overnight.  Revealed mild hyperglycemia with blood sugar in 260 range.  No concerning hypo and hyperglycemia.  Hypoglycemia: Patient has minor  hypoglycemic episodes. Patient has hypoglycemia awareness.  Factors modifying glucose control: 1.  Diabetic diet assessment: Small and variable meals, loss of appetite with recent diagnosis of pancreatic cancer on chemotherapy, unpredictable eating.  2.  Staying active or exercising: none  3.  Medication compliance: compliant all of the time.  Other: He was diagnosed with metastatic adenocarcinoma of pancreas in September 2024, is being treated with neoadjuvant chemotherapy, deemed nonsurgical candidate. Has been following with oncology at  [facility].  Interval history  CGM data as reviewed above.  Mostly acceptable blood sugar.  Occasional mild hypoglycemia.  Hemoglobin A1c 5.8% today.  He has been following at [facility] oncology on chemotherapy, with dexamethasone .  He reports he has good appetite however not able to eat much due to altered taste.  No other complaints today.  He has not been swimming lately due to winter months.  REVIEW OF SYSTEMS As per history of present illness.   PAST MEDICAL HISTORY: Past Medical History:  Diagnosis Date   Arthritis    minor; shoulders primarily (05/20/2013)   CAD (coronary artery disease)    a. 05/2013 - Chest pain/unstable angina and dynamic EKG changes showing cath showing atherosclerotic coronary artery disease manifested as diffuse ectasia, normal EF.   Diverticulosis of colon    GERD (gastroesophageal reflux disease)    GI bleed    secondary to Aspirin    Gout    Hematuria, microscopic    Hemorrhoids    History of radiation therapy    Abdomen- 08/29/23-10/12/23- Dr. Lynwood Nasuti   Hyperglycemia    Hyperlipidemia    Hypertension    Lumbar back pain    Microalbuminuria    Overweight(278.02)    Pancreatic cancer (HCC) 09/22/2022   Polycythemia rubra vera (HCC)    Tubular adenoma of colon 07/2012    PAST SURGICAL HISTORY: Past Surgical History:  Procedure Laterality Date   BILIARY STENT PLACEMENT N/A 09/02/2022   Procedure: BILIARY STENT PLACEMENT;  Surgeon: Abran Norleen SAILOR, MD;  Location: [facility];  Service: Gastroenterology;  Laterality: N/A;   BILIARY STENT PLACEMENT  10/13/2022   Procedure: BILIARY STENT PLACEMENT;  Surgeon: Aneita Gwendlyn DASEN, MD;  Location: [facility] END[facility]COPY;  Service: Gastroenterology;;   BIOPSY  09/11/2022   Procedure: BIOPSY;  Surgeon: Wilhelmenia Aloha Raddle., MD;  Location: [facility];  Service: Gastroenterology;;   CARDIAC CATHETERIZATION  05/20/2013   CATARACT EXTRACTION W/ INTRAOCULAR LENS  IMPLANT, BILATERAL Bilateral 2008   CYSTECTOMY  ~ 2000     SEBACEOUS cyst removed from between shoulder blades   ENDOSCOPIC RETROGRADE CHOLANGIOPANCREATOGRAPHY (ERCP) WITH PROPOFOL  N/A 09/02/2022   Procedure: ENDOSCOPIC RETROGRADE CHOLANGIOPANCREATOGRAPHY (ERCP) WITH PROPOFOL ;  Surgeon: Abran Norleen SAILOR, MD;  Location: [facility];  Service: Gastroenterology;  Laterality: N/A;   ENDOSCOPIC RETROGRADE CHOLANGIOPANCREATOGRAPHY (ERCP) WITH PROPOFOL  N/A 10/13/2022   Procedure: ENDOSCOPIC RETROGRADE CHOLANGIOPANCREATOGRAPHY (ERCP) WITH PROPOFOL ;  Surgeon: Aneita Gwendlyn DASEN, MD;  Location: [facility] ENDOSCOPY;  Service: Gastroenterology;  Laterality: N/A;   ERCP N/A 09/17/2023   Procedure: ERCP, WITH INTERVENTION IF INDICATED;  Surgeon: Wilhelmenia Aloha Raddle., MD;  Location: [name] ENDOSCOPY;  Service: Gastroenterology;  Laterality: N/A;   ESOPHAGOGASTRODUODENOSCOPY N/A 09/11/2022   Procedure: ESOPHAGOGASTRODUODENOSCOPY (EGD);  Surgeon: Wilhelmenia Aloha Raddle., MD;  Location: [facility];  Service: Gastroenterology;  Laterality: N/A;   EUS N/A 09/11/2022   Procedure: UPPER ENDOSCOPIC ULTRASOUND (EUS) RADIAL;  Surgeon: Wilhelmenia Aloha Raddle., MD;  Location: [facility];  Service: Gastroenterology;  Laterality: N/A;   EXCISIONAL HEMORRHOIDECTOMY  1970's   FINE NEEDLE ASPIRATION N/A 09/11/2022  Procedure: FINE NEEDLE ASPIRATION (FNA) LINEAR;  Surgeon: Wilhelmenia Aloha Raddle., MD;  Location: [facility];  Service: Gastroenterology;  Laterality: N/A;   INGUINAL HERNIA REPAIR Right ~ 2010   IR IMAGING GUIDED PORT INSERTION  10/27/2022   LEFT HEART CATHETERIZATION WITH CORONARY ANGIOGRAM N/A 05/20/2013   Procedure: LEFT HEART CATHETERIZATION WITH CORONARY ANGIOGRAM;  Surgeon: Peter M Jordan, MD;  Location[facility] C[facility] LAB;  Service: Cardiovascular;  Laterality: N/A;   SPHINCTEROTOMY  09/02/2022   Procedure: SPHINCTEROTOMY;  Surgeon: Abran Norleen SAILOR, MD;  Location: [facility];  Service: Gastroenterology;;   CLEDA REMOVAL  10/13/2022   Procedure: STENT REMOVAL;  Surgeon: Aneita Gwendlyn DASEN,  MD;  Location: [facility] ENDOSCOPY;  Service: Gastroenterology;;   TONSILLECTOMY AND ADENOIDECTOMY  1940's   VASECTOMY      ALLERGIES: Allergies  Allergen Reactions   Metformin  And Related Diarrhea   Cheese Diarrhea    Allergy to soft cheese, can have hard cheese   Milk-Related Compounds Diarrhea    FAMILY HISTORY:  Family History  Problem Relation Age of Onset   Colon cancer Mother        died at 6, colorectal   Diabetes Mother    Heart disease Father 78       MI   Diabetes Father    Stomach cancer Maternal Grandfather    Cancer Maternal Uncle     SOCIAL HISTORY: Social History   Socioeconomic History   Marital status: Married    Spouse name: Not on file   Number of children: 3   Years of education: Not on file   Highest education level: Some college, no degree  Occupational History   Occupation: retired Garment/textile Technologist: RETIRED  Tobacco Use   Smoking status: Never   Smokeless tobacco: Never   Tobacco comments:    NEVER USED TOBACCO  Vaping Use   Vaping status: Never Used  Substance and Sexual Activity   Alcohol use: No    Alcohol/week: 0.0 standard drinks of alcohol   Drug use: No   Sexual activity: Not Currently    Partners: Female  Other Topics Concern   Not on file  Social History Narrative   Marriedfor 52 years    Daughter, Physicist, Medical (pediatrician--lives in [place], [place]), one in [place] - warden/ranger. One in [place] - Chartered Loss Adjuster          Social Drivers of Health   Financial Resource Strain: Low Risk  (08/11/2023)   Overall Financial Resource Strain (CARDIA)    Difficulty of Paying Living Expenses: Not hard at all  Food Insecurity: No Food Insecurity (09/20/2023)   Hunger Vital Sign    Worried About Running Out of Food in the Last Year: Never true    Ran Out of Food in the Last Year: Never true  Transportation Needs: No Transportation Needs (09/20/2023)   PRAPARE - Administrator, Civil Service (Medical): No    Lack of Transportation  (Non-Medical): No  Physical Activity: Sufficiently Active (08/11/2023)   Exercise Vital Sign    Days of Exercise per Week: 7 days    Minutes of Exercise per Session: 90 min  Stress: No Stress Concern Present (08/11/2023)   [employer] of Occupational Health - Occupational Stress Questionnaire    Feeling of Stress: Not at all  Social Connections: Socially Integrated (09/15/2023)   Social Connection and Isolation Panel    Frequency of Communication with Friends and Family: More than three times a week    Frequency of  Social Gatherings with Friends and Family: More than three times a week    Attends Religious Services: More than 4 times per year    Active Member of Clubs or Organizations: Not on file    Attends Engineer, Structural: More than 4 times per year    Marital Status: Married    MEDICATIONS:  Current Outpatient Medications  Medication Sig Dispense Refill   Accu-Chek Softclix Lancets lancets Use to test blood glucose once daily 100 each 3   amLODipine  (NORVASC ) 10 MG tablet Take 1 tablet (10 mg total) by mouth daily. 90 tablet 3   capecitabine (XELODA) 500 MG tablet Take 3 tablets (1500 mg) in AM and 3 tablets (1500 mg) in PM (12 hours apart) by mouth with water AFTER meal days 1-14. OFF days 15-21 (Patient taking differently: 11/13/2023 On for 2 weeks and off for 2 weeks.)     chlorhexidine  (PERIDEX ) 0.12 % solution      Cholecalciferol (VITAMIN D3) 50 MCG (2000 UT) CAPS Take 2,000 Units by mouth with breakfast, with lunch, and with evening meal.     Continuous Glucose Sensor (FREESTYLE LIBRE 3 PLUS SENSOR) MISC Change sensor every 15 days. 2 each 6   glucose blood (EMBRACE PRO GLUCOSE TEST) test strip Use as instructed 100 each 12   insulin  aspart (NOVOLOG  FLEXPEN) 100 UNIT/ML FlexPen 2-3 units with meals 3 times a day plus sliding scale as instructed, maximum 20units/day (Patient taking differently: 4-5 units with meals 3 times a day plus sliding scale as instructed,  maximum 20units/day) 15 mL 11   insulin  glargine (LANTUS  SOLOSTAR) 100 UNIT/ML Solostar Pen INJECT 20 UNITS INTO THE SKIN 2 TIMES A DAY (Patient taking differently: 11/13/2023  INJECT 18 UNITS INTO THE SKIN daily.) 12 mL 0   Insulin  Pen Needle (DROPLET PEN NEEDLES) 31G X 6 MM MISC Use to check blood sugar TID 100 each 0   Insulin  Pen Needle (EMBECTA PEN NEEDLE NANO 2 GEN) 32G X 4 MM MISC USE TO INJECT INSULIN  FOUR TIMES A DAY 300 each 2   lipase/protease/amylase (CREON ) 36000 UNITS CPEP capsule Take 2 capsules (72,000 Units total) by mouth 3 (three) times daily before meals. (Patient taking differently: Take by mouth 3 (three) times daily before meals. 11/13/2023 Takes 2 capsules with breakfast and lunch, 3 capsules with dinner and 1 capsule with snack.)     ondansetron  (ZOFRAN ) 8 MG tablet Take by mouth.     pantoprazole  (PROTONIX ) 40 MG tablet Take 1 tablet (40 mg total) by mouth 3 (three) times daily.     prochlorperazine  (COMPAZINE ) 10 MG tablet Take 10 mg by mouth every 6 (six) hours.     saccharomyces boulardii (FLORASTOR) 250 MG capsule Take 250 mg by mouth 2 (two) times daily.     No current facility-administered medications for this visit.    PHYSICAL EXAM: Vitals:   12/03/23 1332  BP: 100/62  Pulse: (!) 54  Resp: 16  SpO2: 96%  Weight: 145 lb 12.8 oz (66.1 kg)  Height: 5' 11 (1.803 m)      Body mass index is 20.33 kg/m.  Wt Readings from Last 3 Encounters:  12/03/23 145 lb 12.8 oz (66.1 kg)  11/13/23 145 lb (65.8 kg)  11/12/23 (P) 145 lb 4 oz (65.9 kg)    General: Well developed, well nourished male in no apparent distress.  HEENT: AT/[place], no external lesions.  Eyes: Conjunctiva clear and no icterus. Neck: Neck supple  Lungs: Respirations not labored Neurologic: Alert,  oriented, normal speech Extremities / Skin: Dry.  Psychiatric: Does not appear depressed or anxious  Diabetic Foot Exam - Simple   No data filed    LABS Reviewed Lab Results  Component Value  Date   HGBA1C 5.8 (A) 12/03/2023   HGBA1C 6.6 (A) 05/01/2023   HGBA1C 6.1 (A) 01/31/2023   No results found for: FRUCTOSAMINE Lab Results  Component Value Date   CHOL 170 03/16/2021   HDL 47.70 03/16/2021   LDLCALC 85 03/16/2021   TRIG 183.0 (H) 03/16/2021   CHOLHDL 4 03/16/2021   Lab Results  Component Value Date   MICRALBCREAT 56 (H) 01/31/2023   MICRALBCREAT 26.7 06/11/2007   Lab Results  Component Value Date   CREATININE 0.96 11/13/2023   Lab Results  Component Value Date   GFR 56.87 (L) 09/05/2022    ASSESSMENT / PLAN  1. Type 2 diabetes mellitus with hyperglycemia, with long-term current use of insulin  (HCC)    Diabetes Mellitus type 2, complicated by no other known complication. - Diabetic status / severity: fair Controlled  Lab Results  Component Value Date   HGBA1C 5.8 (A) 12/03/2023    - Hemoglobin A1c goal : <7%  Hemoglobin A1c 5.8% likely mildly falsely low due to anemia.  Patient has long history of type 2 diabetes mellitus/prediabetes for several years, was controlled on lifestyle modification.  Multidose insulin  therapy was started in October 2024 after the diagnosis of pancreatic cancer during the course of chemotherapy treatment.  He is deemed to be nonsurgical candidate.  On chemotherapy with glucocorticoid.  CGM data as reviewed above.   - Medications: See below.  Adjusted as follows.  Diabetes regimen:   Decreased Lantus  12 units daily in the morning once a day only.  On chemo days okay to  take 15 units fro 3 days daily.  He is currently taking 15 units on regular days and 18 units during chemotherapy days.  Humalog  5-6 units with breakfast, 5-6 units with lunch and 5-6 units with supper.  Take 4 units with breakfast and lunch on swimming days.  If your meal has added carbohydrates take up to 8 units with meal.  Take Humalog  10 to 15 minutes before eating if possible.  Use the following sliding scale if blood sugar is high before  eating to give additional insulin  based on sliding scale for the meals.  Mild Sliding Scale Blood Glucose        Insulin  60-150                     None 151-200                   1 unit 201-250                   2 units 251-300                   4 units 301-350                   6 units 351-400                   8 units      >400                        9 units and call provider    - Home glucose testing: Freestyle libre 3 plus and check as needed.  - Discussed/ Gave  Hypoglycemia treatment plan.  Advised to take glucose tablet 2 to 4 units for correcting hypoglycemia.  Advised to get it from pharmacy over-the-counter.  Glucagon  emergency kit prescribed.  # Consult : not required at this time.   # Annual urine for microalbuminuria/ creatinine ratio, + microalbuminuria currently.    Last  Lab Results  Component Value Date   MICRALBCREAT 56 (H) 01/31/2023    # Foot check nightly / neuropathy.  # Annual dilated diabetic eye exams.    2. Blood pressure  -  BP Readings from Last 1 Encounters:  12/03/23 100/62    - Control is in target. - No change in current plans.  3. Lipid status / Hyperlipidemia - Last  Lab Results  Component Value Date   LDLCALC 85 03/16/2021    Diagnoses and all orders for this visit:  Type 2 diabetes mellitus with hyperglycemia, with long-term current use of insulin  (HCC) -     POCT glycosylated hemoglobin (Hb A1C)    DISPOSITION Follow up in clinic in 3 months suggested.  Asked to call our clinic in between the visits in regard to question or concern regarding blood glucose.   All questions answered and patient verbalized understanding of the plan.  Kameelah Minish, MD [facility] Endocrinology [facility] [address] [place], [address] [place], [place] [address] Phone # [phone]  At least part of this note was generated using voice recognition software. Inadvertent word errors may have occurred, which were not recognized during the  proofreading process.

## 2023-12-12 ENCOUNTER — Inpatient Hospital Stay: Admitting: Licensed Clinical Social Worker

## 2023-12-12 ENCOUNTER — Inpatient Hospital Stay: Attending: Hematology & Oncology

## 2023-12-12 ENCOUNTER — Other Ambulatory Visit: Payer: Self-pay

## 2023-12-12 ENCOUNTER — Inpatient Hospital Stay

## 2023-12-12 ENCOUNTER — Encounter: Payer: Self-pay | Admitting: Hematology & Oncology

## 2023-12-12 ENCOUNTER — Inpatient Hospital Stay (HOSPITAL_BASED_OUTPATIENT_CLINIC_OR_DEPARTMENT_OTHER): Admitting: Hematology & Oncology

## 2023-12-12 VITALS — BP 129/74 | HR 54 | Temp 98.2°F | Resp 18 | Wt 145.0 lb

## 2023-12-12 DIAGNOSIS — C259 Malignant neoplasm of pancreas, unspecified: Secondary | ICD-10-CM | POA: Insufficient documentation

## 2023-12-12 DIAGNOSIS — C250 Malignant neoplasm of head of pancreas: Secondary | ICD-10-CM | POA: Diagnosis not present

## 2023-12-12 DIAGNOSIS — C7800 Secondary malignant neoplasm of unspecified lung: Secondary | ICD-10-CM | POA: Insufficient documentation

## 2023-12-12 DIAGNOSIS — Z79899 Other long term (current) drug therapy: Secondary | ICD-10-CM | POA: Diagnosis not present

## 2023-12-12 DIAGNOSIS — D45 Polycythemia vera: Secondary | ICD-10-CM | POA: Diagnosis not present

## 2023-12-12 LAB — CBC WITH DIFFERENTIAL (CANCER CENTER ONLY)
Abs Immature Granulocytes: 0.03 K/uL (ref 0.00–0.07)
Basophils Absolute: 0 K/uL (ref 0.0–0.1)
Basophils Relative: 0 %
Eosinophils Absolute: 0.3 K/uL (ref 0.0–0.5)
Eosinophils Relative: 4 %
HCT: 30.1 % — ABNORMAL LOW (ref 39.0–52.0)
Hemoglobin: 10.3 g/dL — ABNORMAL LOW (ref 13.0–17.0)
Immature Granulocytes: 1 %
Lymphocytes Relative: 15 %
Lymphs Abs: 0.9 K/uL (ref 0.7–4.0)
MCH: 35.2 pg — ABNORMAL HIGH (ref 26.0–34.0)
MCHC: 34.2 g/dL (ref 30.0–36.0)
MCV: 102.7 fL — ABNORMAL HIGH (ref 80.0–100.0)
Monocytes Absolute: 1.2 K/uL — ABNORMAL HIGH (ref 0.1–1.0)
Monocytes Relative: 19 %
Neutro Abs: 3.7 K/uL (ref 1.7–7.7)
Neutrophils Relative %: 61 %
Platelet Count: 158 K/uL (ref 150–400)
RBC: 2.93 MIL/uL — ABNORMAL LOW (ref 4.22–5.81)
RDW: 19.1 % — ABNORMAL HIGH (ref 11.5–15.5)
WBC Count: 6.1 K/uL (ref 4.0–10.5)
nRBC: 0 % (ref 0.0–0.2)

## 2023-12-12 LAB — LACTATE DEHYDROGENASE: LDH: 190 U/L (ref 105–235)

## 2023-12-12 LAB — CMP (CANCER CENTER ONLY)
ALT: 20 U/L (ref 0–44)
AST: 24 U/L (ref 15–41)
Albumin: 3.7 g/dL (ref 3.5–5.0)
Alkaline Phosphatase: 150 U/L — ABNORMAL HIGH (ref 38–126)
Anion gap: 10 (ref 5–15)
BUN: 13 mg/dL (ref 8–23)
CO2: 24 mmol/L (ref 22–32)
Calcium: 8.7 mg/dL — ABNORMAL LOW (ref 8.9–10.3)
Chloride: 106 mmol/L (ref 98–111)
Creatinine: 0.96 mg/dL (ref 0.61–1.24)
GFR, Estimated: >60 mL/min (ref >60)
Glucose, Bld: 138 mg/dL — ABNORMAL HIGH (ref 70–99)
Potassium: 3.5 mmol/L (ref 3.5–5.1)
Sodium: 141 mmol/L (ref 135–145)
Total Bilirubin: 0.7 mg/dL (ref 0.0–1.2)
Total Protein: 6.1 g/dL — ABNORMAL LOW (ref 6.5–8.1)

## 2023-12-12 LAB — PREALBUMIN: Prealbumin: 18 mg/dL (ref 18–38)

## 2023-12-12 NOTE — Progress Notes (Signed)
 Is been a little Hematology and Oncology Follow Up Visit  Alexander [name] [id number] Apr 13, 1940 83 y.o. 12/12/2023  Principle Diagnosis:  Adenocarcinoma of the pancreas-stage Ib (T2N0M0) --clinical stage --BRCA (-), KRAS (+), HRD(-) --metastatic to lung   Current Therapy:        Status post biliary stent placement FOLFIRINOX -- Neoadj - s/p cycle #12/12  - start on 10/31/2022 --completed on 05/08/2023 XRT + Gemzar  -- start on 08/27/2023   - s/p 21/25 fractions of XRT    Interim History:  Alexander [name] is here today for follow-up.  He is managing okay.  The real problem that has right now is that he has very little taste for food.  I am sure this is probably from his Xeloda.  I am not sure he is going to be taking any more Xeloda.  He is still followed at [facility].  They are awaiting to try to get him on a clinical trial for his K-ras mutation.  He has had no problems with diarrhea.  He has had no bleeding.  He has had no fever.  He has had no cough.  He is having no leg swelling.  His last CA 19-9 was down to 24.  I think he is set up for a CT scan at [facility] in early January.  Overall, I will say that his performance status is probably ECOG 1.     Wt Readings from Last 3 Encounters:  12/12/23 145 lb (65.8 kg)  12/03/23 145 lb 12.8 oz (66.1 kg)  11/13/23 145 lb (65.8 kg)   Medications:  Allergies as of 12/12/2023       Reactions   Metformin  And Related Diarrhea   Cheese Diarrhea   Allergy to soft cheese, can have hard cheese   Milk-related Compounds Diarrhea        Medication List        Accurate as of December 12, 2023  3:20 PM. If you have any questions, ask your nurse or doctor.          Accu-Chek Softclix Lancets lancets Use to test blood glucose once daily   amLODipine  10 MG tablet Commonly known as: NORVASC  Take 1 tablet (10 mg total) by mouth daily.   capecitabine 500 MG tablet Commonly known as: XELODA Take 3 tablets (1500 mg) in AM and 3 tablets  (1500 mg) in PM (12 hours apart) by mouth with water AFTER meal days 1-14. OFF days 15-21   chlorhexidine  0.12 % solution Commonly known as: PERIDEX    Droplet Pen Needles 31G X 6 MM Misc Generic drug: Insulin  Pen Needle Use to check blood sugar TID   Embecta Pen Needle Nano 2 Gen 32G X 4 MM Misc Generic drug: Insulin  Pen Needle USE TO INJECT INSULIN  FOUR TIMES A DAY   Embrace Pro Glucose Test test strip Generic drug: glucose blood Use as instructed   FreeStyle Libre 3 Plus Sensor Misc Change sensor every 15 days.   Lantus  SoloStar 100 UNIT/ML Solostar Pen Generic drug: insulin  glargine INJECT 20 UNITS INTO THE SKIN 2 TIMES A DAY What changed: additional instructions   lipase/protease/amylase 63999 UNITS Cpep capsule Commonly known as: Creon  Take 2 capsules (72,000 Units total) by mouth 3 (three) times daily before meals. What changed:  how much to take additional instructions   NovoLOG  FlexPen 100 UNIT/ML FlexPen Generic drug: insulin  aspart 2-3 units with meals 3 times a day plus sliding scale as instructed, maximum 20units/day What changed: additional instructions   ondansetron   8 MG tablet Commonly known as: ZOFRAN  Take by mouth.   pantoprazole  40 MG tablet Commonly known as: PROTONIX  Take 1 tablet (40 mg total) by mouth 3 (three) times daily.   prochlorperazine  10 MG tablet Commonly known as: COMPAZINE  Take 10 mg by mouth every 6 (six) hours.   saccharomyces boulardii 250 MG capsule Commonly known as: FLORASTOR Take 250 mg by mouth 2 (two) times daily.   vitamin D3 50 MCG (2000 UT) Caps Take 2,000 Units by mouth with breakfast, with lunch, and with evening meal.        Allergies:  Allergies  Allergen Reactions   Metformin  And Related Diarrhea   Cheese Diarrhea    Allergy to soft cheese, can have hard cheese   Milk-Related Compounds Diarrhea    Past Medical History, Surgical history, Social history, and Family History were reviewed and  updated.  Review of Systems:  Review of Systems  Constitutional: Negative.   HENT: Negative.    Eyes: Negative.   Respiratory: Negative.    Cardiovascular: Negative.   Gastrointestinal: Negative.   Genitourinary: Negative.   Musculoskeletal: Negative.   Skin: Negative.   Neurological: Negative.   Endo/Heme/Allergies: Negative.   Psychiatric/Behavioral: Negative.       Physical Exam:  weight is 145 lb (65.8 kg). His oral temperature is 98.2 F (36.8 C). His blood pressure is 129/74 and his pulse is 54 (abnormal). His respiration is 18 and oxygen saturation is 100%.   Wt Readings from Last 3 Encounters:  12/12/23 145 lb (65.8 kg)  12/03/23 145 lb 12.8 oz (66.1 kg)  11/13/23 145 lb (65.8 kg)    Physical Exam Vitals reviewed.  HENT:     Head: Normocephalic and atraumatic.  Eyes:     Pupils: Pupils are equal, round, and reactive to light.  Cardiovascular:     Rate and Rhythm: Normal rate and regular rhythm.     Heart sounds: Normal heart sounds.  Pulmonary:     Effort: Pulmonary effort is normal.     Breath sounds: Normal breath sounds.  Abdominal:     General: Bowel sounds are normal.     Palpations: Abdomen is soft.  Musculoskeletal:        General: No tenderness or deformity. Normal range of motion.     Cervical back: Normal range of motion.  Lymphadenopathy:     Cervical: No cervical adenopathy.  Skin:    General: Skin is warm and dry.     Findings: No erythema or rash.  Neurological:     Mental Status: He is alert and oriented to person, place, and time.  Psychiatric:        Behavior: Behavior normal.        Thought Content: Thought content normal.        Judgment: Judgment normal.      Lab Results  Component Value Date   WBC 6.1 12/12/2023   HGB 10.3 (L) 12/12/2023   HCT 30.1 (L) 12/12/2023   MCV 102.7 (H) 12/12/2023   PLT 158 12/12/2023   Lab Results  Component Value Date   FERRITIN 165 10/02/2023   IRON 114 10/02/2023   TIBC 270 10/02/2023    UIBC 156 10/02/2023   IRONPCTSAT 42 (H) 10/02/2023   Lab Results  Component Value Date   RETICCTPCT 0.7 08/30/2022   RBC 2.93 (L) 12/12/2023   RETICCTABS 32.6 12/19/2013   No results found for: KPAFRELGTCHN, LAMBDASER, KAPLAMBRATIO No results found for: IGGSERUM, IGA, IGMSERUM No results found for:  STEPHANY CARLOTA BENSON MARKEL EARLA JOANNIE DOC VICK, SPEI   Chemistry      Component Value Date/Time   NA 141 12/12/2023 1212   NA 144 11/29/2016 1054   NA 139 12/29/2015 0954   K 3.5 12/12/2023 1212   K 4.2 11/29/2016 1054   K 4.1 12/29/2015 0954   CL 106 12/12/2023 1212   CL 102 11/29/2016 1054   CO2 24 12/12/2023 1212   CO2 30 11/29/2016 1054   CO2 26 12/29/2015 0954   BUN 13 12/12/2023 1212   BUN 22 11/29/2016 1054   BUN 20.0 12/29/2015 0954   CREATININE 0.96 12/12/2023 1212   CREATININE 1.2 11/29/2016 1054   CREATININE 1.3 12/29/2015 0954      Component Value Date/Time   CALCIUM  8.7 (L) 12/12/2023 1212   CALCIUM  9.3 11/29/2016 1054   CALCIUM  9.5 12/29/2015 0954   ALKPHOS 150 (H) 12/12/2023 1212   ALKPHOS 92 (H) 11/29/2016 1054   ALKPHOS 98 12/29/2015 0954   AST 24 12/12/2023 1212   AST 16 12/29/2015 0954   ALT 20 12/12/2023 1212   ALT 20 11/29/2016 1054   ALT 13 12/29/2015 0954   BILITOT 0.7 12/12/2023 1212   BILITOT 0.71 12/29/2015 0954        Impression and Plan:  Mr. Schellenberg is a pleasant 83 yo gentleman with original diagnosis of  polycythemia vera and early stage pancreatic cancer. He was originally treated with neoadjuvant FOLFIRINOX but unfortunately he had progressive disease.  As such, he was not able to have surgery to resect out the pancreatic mass.   I am not sure at all as when he would go into a clinical trial.  Hopefully, he would be a candidate.  I know that [facility] is doing tough job trying to get him onto protocol.  Hopefully, he will be able to have a little bit better taste for food.  I would  think that if he gets off the Xeloda, this will certainly help his taste.  His labs actually pretty good.  His blood sugar is under decent control.  We will plan to get him back probably right after he has his scans done.    Maude JONELLE Crease, MD 12/10/20253:20 PM

## 2023-12-12 NOTE — Patient Instructions (Signed)

## 2023-12-12 NOTE — Progress Notes (Signed)
 [facility] CSW Progress Note  Visual Merchandiser met with patient to follow-up on emotional support.    Interventions: Met with patient and his wife who is now diagnosed with cancer.  They are both meeting with Dr. Timmy today.  Patient states he remains as active as possible.  No other needs at this time.      Follow Up Plan:  CSW will follow-up with patient by phone     Macario CHRISTELLA Au, LCSW Clinical Social Worker [facility]

## 2023-12-13 ENCOUNTER — Ambulatory Visit: Payer: Self-pay | Admitting: Hematology & Oncology

## 2023-12-13 LAB — CANCER ANTIGEN 19-9: CA 19-9: 13 U/mL (ref 0–35)

## 2023-12-24 ENCOUNTER — Inpatient Hospital Stay: Admitting: Hematology & Oncology

## 2023-12-24 ENCOUNTER — Other Ambulatory Visit (INDEPENDENT_AMBULATORY_CARE_PROVIDER_SITE_OTHER): Payer: Self-pay

## 2023-12-24 ENCOUNTER — Inpatient Hospital Stay

## 2023-12-24 ENCOUNTER — Other Ambulatory Visit

## 2023-12-24 DIAGNOSIS — E1169 Type 2 diabetes mellitus with other specified complication: Secondary | ICD-10-CM

## 2023-12-24 NOTE — Patient Instructions (Signed)
 Visit Information  Thank you for taking time to visit with me today. Please don't hesitate to contact me if I can be of assistance to you before our next scheduled appointment.  Your next care management appointment is by telephone on 01/24/24 at 1pm  Please call the care guide team at [phone] if you need to cancel, schedule, or reschedule an appointment.   Please call the Suicide and Crisis Lifeline: 988 call the [place]  National Suicide Prevention Lifeline: 352 061 8571 or TTY: (678) 010-4715 TTY [phone]) to talk to a trained counselor call 1-800-273-TALK (toll free, 24 hour hotline) if you are experiencing a Mental Health or Behavioral Health Crisis or need someone to talk to.  Warren Quivers RN CM Population Health-Complex Care Management [facility] [phone]

## 2023-12-24 NOTE — Patient Outreach (Signed)
 Complex Care Management   Visit Note  12/24/2023  Name:  Alexander [name] MRN: [id number] DOB: [birth year]  Situation: Referral received for Complex Care Management related to Diabetes with Complications and pancreatic cancer. I obtained verbal consent from Patient.  Visit completed with Patient  on the phone  Background:   Past Medical History:  Diagnosis Date   Arthritis    minor; shoulders primarily (05/20/2013)   CAD (coronary artery disease)    a. 05/2013 - Chest pain/unstable angina and dynamic EKG changes showing cath showing atherosclerotic coronary artery disease manifested as diffuse ectasia, normal EF.   Diverticulosis of colon    GERD (gastroesophageal reflux disease)    GI bleed    secondary to Aspirin    Gout    Hematuria, microscopic    Hemorrhoids    History of radiation therapy    Abdomen- 08/29/23-10/12/23- Dr. Lynwood Nasuti   Hyperglycemia    Hyperlipidemia    Hypertension    Lumbar back pain    Microalbuminuria    Overweight(278.02)    Pancreatic cancer (HCC) 09/22/2022   Polycythemia rubra vera (HCC)    Tubular adenoma of colon 07/2012    Assessment: Patient Reported Symptoms:  Cognitive Cognitive Status: No symptoms reported      Neurological Neurological Review of Symptoms: No symptoms reported Neurological Management Strategies: Adequate rest, Routine screening, Coping strategies Neurological Self-Management Outcome: 4 (good)  HEENT HEENT Symptoms Reported: No symptoms reported      Cardiovascular Cardiovascular Symptoms Reported: No symptoms reported Does patient have uncontrolled Hypertension?: No Weight: 146 lb (66.2 kg) Cardiovascular Self-Management Outcome: 4 (good)  Respiratory Respiratory Symptoms Reported: No symptoms reported    Endocrine Endocrine Symptoms Reported: No symptoms reported Is patient diabetic?: Yes Is patient checking blood sugars at home?: Yes List most recent blood sugar readings, include date and time of day:  A1C- 6.3, Lantus  insulin  decreased to 13 units daily. BS reported to be in normal range. Uses CGM Endocrine Self-Management Outcome: 5 (very good)  Gastrointestinal Gastrointestinal Symptoms Reported: Other Other Gastrointestinal Symptoms: Reports altered taste since being on chemo treatment. Additional Gastrointestinal Details: Being treated for pancreatic cancer Gastrointestinal Management Strategies: Medication therapy, Diet modification, Adequate rest    Genitourinary Genitourinary Symptoms Reported: No symptoms reported    Integumentary Integumentary Symptoms Reported: Bruising Additional Integumentary Details: patient reports bruising for many years but has not gotten any worse. Skin Management Strategies: Coping strategies, Routine screening Skin Self-Management Outcome: 4 (good)  Musculoskeletal Musculoskelatal Symptoms Reviewed: Other Other Musculoskeletal Symptoms: Minor arthritis pains. Musculoskeletal Management Strategies: Routine screening, Adequate rest, Weight management      Psychosocial Psychosocial Symptoms Reported: No symptoms reported          12/24/2023    PHQ2-9 Depression Screening   Little interest or pleasure in doing things    Feeling down, depressed, or hopeless    PHQ-2 - Total Score    Trouble falling or staying asleep, or sleeping too much    Feeling tired or having little energy    Poor appetite or overeating     Feeling bad about yourself - or that you are a failure or have let yourself or your family down    Trouble concentrating on things, such as reading the newspaper or watching television    Moving or speaking so slowly that other people could have noticed.  Or the opposite - being so fidgety or restless that you have been moving around a lot more than usual    Thoughts that  you would be better off dead, or hurting yourself in some way    PHQ2-9 Total Score    If you checked off any problems, how difficult have these problems made it for you  to do your work, take care of things at home, or get along with other people    Depression Interventions/Treatment      Today's Vitals   12/24/23 1509  Weight: 146 lb (66.2 kg)   Pain Scale: 0-10 Pain Score: 0-No pain  Medications Reviewed Today     Reviewed by Leodis Warren DEL, RN (Registered Nurse) on 12/24/23 at 1506  Med List Status: <None>   Medication Order Taking? Sig Documenting Provider Last Dose Status Informant  Accu-Chek Softclix Lancets lancets [id number] Yes Use to test blood glucose once daily Nafziger, Darleene, NP  Active Self, Pharmacy Records  amLODipine  (NORVASC ) 10 MG tablet [id number] Yes Take 1 tablet (10 mg total) by mouth daily. Nafziger, Darleene, NP  Active Self, Pharmacy Records  capecitabine (XELODA) 500 MG tablet [id number] Yes Take 3 tablets (1500 mg) in AM and 3 tablets (1500 mg) in PM (12 hours apart) by mouth with water AFTER meal days 1-14. OFF days 15-21 [provider]  Active   chlorhexidine  (PERIDEX ) 0.12 % solution [id number]    Patient not taking: Reported on 12/24/2023   [provider]  Active   Cholecalciferol (VITAMIN D3) 50 MCG (2000 UT) CAPS [id number] Yes Take 2,000 Units by mouth with breakfast, with lunch, and with evening meal. [provider]  Active Self, Pharmacy Records  Continuous Glucose Sensor (FREESTYLE LIBRE 3 PLUS SENSOR) MISC [id number] Yes Change sensor every 15 days. Nafziger, Darleene, NP  Active   glucose blood (EMBRACE PRO GLUCOSE TEST) test strip [id number] Yes Use as instructed Nafziger, Cory, NP  Active Self, Pharmacy Records  insulin  aspart (NOVOLOG  FLEXPEN) 100 UNIT/ML FlexPen [id number] Yes 2-3 units with meals 3 times a day plus sliding scale as instructed, maximum 20units/day Thapa, Sudan, MD  Active Self, Pharmacy Records  insulin  glargine (LANTUS  SOLOSTAR) 100 UNIT/ML Solostar Pen [id number] Yes INJECT 20 UNITS INTO THE SKIN 2 TIMES A DAY  Patient taking differently: 12/24/2023  INJECT 13 UNITS INTO THE SKIN  daily.   Merna Darleene, NP  Active Self, Pharmacy Records           Med Note RENNIS, DUROJAHYE' R   Sat Sep 15, 2023  9:56 AM) Changes on days where he has chemo  Insulin  Pen Needle (DROPLET PEN NEEDLES) 31G X 6 MM MISC [id number] Yes Use to check blood sugar TID Nafziger, Cory, NP  Active Self, Pharmacy Records  Insulin  Pen Needle (EMBECTA PEN NEEDLE NANO 2 GEN) 32G X 4 MM MISC [id number] Yes USE TO INJECT INSULIN  FOUR TIMES A DAY Thapa, Sudan, MD  Active   lipase/protease/amylase (CREON ) 36000 UNITS CPEP capsule [id number] Yes Take 2 capsules (72,000 Units total) by mouth 3 (three) times daily before meals. Samtani, Jai-Gurmukh, MD  Active   ondansetron  (ZOFRAN ) 8 MG tablet [id number] Yes Take by mouth. [provider]  Active   pantoprazole  (PROTONIX ) 40 MG tablet [id number] Yes Take 1 tablet (40 mg total) by mouth 3 (three) times daily. Samtani, Jai-Gurmukh, MD  Active   prochlorperazine  (COMPAZINE ) 10 MG tablet [id number] Yes Take 10 mg by mouth every 6 (six) hours. [provider]  Active   saccharomyces boulardii (FLORASTOR) 250 MG capsule [id number] Yes Take 250 mg by mouth 2 (two) times daily. [provider]  Active Self, Pharmacy Records  Recommendation:   Continue Current Plan of Care  Follow Up Plan:   Telephone follow up appointment date/time:  01/24/24 at 1 pm   Warren Quivers RN CM Population Health-Complex Care Management [facility] [phone]

## 2023-12-29 ENCOUNTER — Other Ambulatory Visit: Payer: Self-pay | Admitting: Hematology & Oncology

## 2023-12-31 ENCOUNTER — Encounter: Payer: Self-pay | Admitting: *Deleted

## 2023-12-31 ENCOUNTER — Encounter: Payer: Self-pay | Admitting: Hematology & Oncology

## 2024-01-07 ENCOUNTER — Telehealth: Payer: Self-pay | Admitting: Gastroenterology

## 2024-01-07 DIAGNOSIS — E1165 Type 2 diabetes mellitus with hyperglycemia: Secondary | ICD-10-CM

## 2024-01-07 NOTE — Telephone Encounter (Signed)
 Inbound call from patient stating he has had 5 episodes of severe pain from biliary stent. States he will be having labs completed tomorrow at [facility] if they are needed to be reviewed. Requesting a call back. Please advise, thank you

## 2024-01-07 NOTE — Telephone Encounter (Signed)
 The pt states that he spoke with Dr Wilhelmenia recently while his wife was hospitalized. He wants Dr Wilhelmenia to know that he is seeing [facility] tomorrow and having labs and CT scan. He will have them sent to Dr Wilhelmenia for review.

## 2024-01-07 NOTE — Telephone Encounter (Signed)
 Once I can review the labs and imaging, we can determine what next steps could be required. Thanks. GM

## 2024-01-07 NOTE — Telephone Encounter (Signed)
 Left message on machine to call back

## 2024-01-11 ENCOUNTER — Inpatient Hospital Stay

## 2024-01-11 ENCOUNTER — Other Ambulatory Visit: Payer: Self-pay

## 2024-01-11 ENCOUNTER — Inpatient Hospital Stay: Attending: Hematology & Oncology | Admitting: Hematology & Oncology

## 2024-01-11 ENCOUNTER — Encounter: Payer: Self-pay | Admitting: Hematology & Oncology

## 2024-01-11 ENCOUNTER — Encounter: Payer: Self-pay | Admitting: *Deleted

## 2024-01-11 ENCOUNTER — Other Ambulatory Visit: Payer: Self-pay | Admitting: Endocrinology

## 2024-01-11 DIAGNOSIS — Z794 Long term (current) use of insulin: Secondary | ICD-10-CM

## 2024-01-11 MED ORDER — LANTUS SOLOSTAR 100 UNIT/ML ~~LOC~~ SOPN: 13.0000 [IU] | PEN_INJECTOR | Freq: Every day | SUBCUTANEOUS | 4 refills | Status: AC

## 2024-01-11 NOTE — Progress Notes (Unsigned)
 Is been a little Hematology and Oncology Follow Up Visit  Alexander [name] [id number] Jan 29, 1940 84 y.o. 01/11/2024  Principle Diagnosis:  Adenocarcinoma of the pancreas-stage Ib (T2N0M0) --clinical stage --BRCA (-), KRAS (+), HRD(-) --metastatic to lung   Current Therapy:        Status post biliary stent placement FOLFIRINOX -- Neoadj - s/p cycle #12/12  - start on 10/31/2022 --completed on 05/08/2023 XRT + Gemzar  -- start on 08/27/2023   - s/p 21/25 fractions of XRT    Interim History:  Alexander [name] is here today for follow-up.  He is managing okay.  The real problem that has right now is that he has very little taste for food.  I am sure this is probably from his Xeloda.  I am not sure he is going to be taking any more Xeloda.  He is still followed at [facility]  They are awaiting to try to get him on a clinical trial for his K-ras mutation.  He has had no problems with diarrhea.  He has had no bleeding.  He has had no fever.  He has had no cough.  He is having no leg swelling.  His last CA 19-9 was down to 24.  I think he is set up for a CT scan at [facility] in early January.  Overall, I will say that his performance status is probably ECOG 1.     Wt Readings from Last 3 Encounters:  01/11/24 144 lb (65.3 kg)  12/24/23 146 lb (66.2 kg)  12/12/23 145 lb (65.8 kg)   Medications:  Allergies as of 01/11/2024       Reactions   Metformin  And Related Diarrhea   Cheese Diarrhea   Allergy to soft cheese, can have hard cheese   Milk-related Compounds Diarrhea        Medication List        Accurate as of January 11, 2024 11:09 AM. If you have any questions, ask your nurse or doctor.          Accu-Chek Softclix Lancets lancets Use to test blood glucose once daily   amLODipine  10 MG tablet Commonly known as: NORVASC  Take 1 tablet (10 mg total) by mouth daily.   capecitabine 500 MG tablet Commonly known as: XELODA Take 3 tablets (1500 mg) in AM and 3 tablets (1500 mg) in PM  (12 hours apart) by mouth with water AFTER meal days 1-14. OFF days 15-21   chlorhexidine  0.12 % solution Commonly known as: PERIDEX    Creon  36000-114000 units Cpep capsule Generic drug: lipase/protease/amylase TAKE 2 CAPSULES BY MOUTH 3 TIMES A DAY WITH MEALS AND 1 CAPSULE AS NEEDED WITH UP TO 4 SNACKS PER DAY   Droplet Pen Needles 31G X 6 MM Misc Generic drug: Insulin  Pen Needle Use to check blood sugar TID   Embecta Pen Needle Nano 2 Gen 32G X 4 MM Misc Generic drug: Insulin  Pen Needle USE TO INJECT INSULIN  FOUR TIMES A DAY   Embrace Pro Glucose Test test strip Generic drug: glucose blood Use as instructed   FreeStyle Libre 3 Plus Sensor Misc Change sensor every 15 days.   Lantus  SoloStar 100 UNIT/ML Solostar Pen Generic drug: insulin  glargine Inject 13 Units into the skin daily. What changed:  how much to take how to take this when to take this additional instructions Changed by: Sudan Thapa, MD   NovoLOG  FlexPen 100 UNIT/ML FlexPen Generic drug: insulin  aspart INJECT 2 TO 3 UNITS WITH MEALS THREE TIMES A DAY  PLUS SLIDING SCALE AS INSTRUCTED. MAX OF 20 UNITS PER DAY   ondansetron  8 MG tablet Commonly known as: ZOFRAN  Take by mouth.   pantoprazole  40 MG tablet Commonly known as: PROTONIX  Take 1 tablet (40 mg total) by mouth 3 (three) times daily.   prochlorperazine  10 MG tablet Commonly known as: COMPAZINE  Take 10 mg by mouth every 6 (six) hours.   saccharomyces boulardii 250 MG capsule Commonly known as: FLORASTOR Take 250 mg by mouth 2 (two) times daily.   vitamin D3 50 MCG (2000 UT) Caps Take 2,000 Units by mouth with breakfast, with lunch, and with evening meal.        Allergies:  Allergies  Allergen Reactions   Metformin  And Related Diarrhea   Cheese Diarrhea    Allergy to soft cheese, can have hard cheese   Milk-Related Compounds Diarrhea    Past Medical History, Surgical history, Social history, and Family History were reviewed and  updated.  Review of Systems:  Review of Systems  Constitutional: Negative.   HENT: Negative.    Eyes: Negative.   Respiratory: Negative.    Cardiovascular: Negative.   Gastrointestinal: Negative.   Genitourinary: Negative.   Musculoskeletal: Negative.   Skin: Negative.   Neurological: Negative.   Endo/Heme/Allergies: Negative.   Psychiatric/Behavioral: Negative.       Physical Exam:  height is 5' 11 (1.803 m) and weight is 144 lb (65.3 kg). His oral temperature is 97.9 F (36.6 C). His blood pressure is 127/84 and his pulse is 63. His respiration is 18 and oxygen saturation is 100%.   Wt Readings from Last 3 Encounters:  01/11/24 144 lb (65.3 kg)  12/24/23 146 lb (66.2 kg)  12/12/23 145 lb (65.8 kg)    Physical Exam Vitals reviewed.  HENT:     Head: Normocephalic and atraumatic.  Eyes:     Pupils: Pupils are equal, round, and reactive to light.  Cardiovascular:     Rate and Rhythm: Normal rate and regular rhythm.     Heart sounds: Normal heart sounds.  Pulmonary:     Effort: Pulmonary effort is normal.     Breath sounds: Normal breath sounds.  Abdominal:     General: Bowel sounds are normal.     Palpations: Abdomen is soft.  Musculoskeletal:        General: No tenderness or deformity. Normal range of motion.     Cervical back: Normal range of motion.  Lymphadenopathy:     Cervical: No cervical adenopathy.  Skin:    General: Skin is warm and dry.     Findings: No erythema or rash.  Neurological:     Mental Status: He is alert and oriented to person, place, and time.  Psychiatric:        Behavior: Behavior normal.        Thought Content: Thought content normal.        Judgment: Judgment normal.      Lab Results  Component Value Date   WBC 6.1 12/12/2023   HGB 10.3 (L) 12/12/2023   HCT 30.1 (L) 12/12/2023   MCV 102.7 (H) 12/12/2023   PLT 158 12/12/2023   Lab Results  Component Value Date   FERRITIN 165 10/02/2023   IRON 114 10/02/2023   TIBC  270 10/02/2023   UIBC 156 10/02/2023   IRONPCTSAT 42 (H) 10/02/2023   Lab Results  Component Value Date   RETICCTPCT 0.7 08/30/2022   RBC 2.93 (L) 12/12/2023   RETICCTABS 32.6 12/19/2013  No results found for: KPAFRELGTCHN, LAMBDASER, KAPLAMBRATIO No results found for: IGGSERUM, IGA, IGMSERUM No results found for: STEPHANY CARLOTA BENSON MARKEL EARLA JOANNIE DOC VICK, SPEI   Chemistry      Component Value Date/Time   NA 141 12/12/2023 1212   NA 144 11/29/2016 1054   NA 139 12/29/2015 0954   K 3.5 12/12/2023 1212   K 4.2 11/29/2016 1054   K 4.1 12/29/2015 0954   CL 106 12/12/2023 1212   CL 102 11/29/2016 1054   CO2 24 12/12/2023 1212   CO2 30 11/29/2016 1054   CO2 26 12/29/2015 0954   BUN 13 12/12/2023 1212   BUN 22 11/29/2016 1054   BUN 20.0 12/29/2015 0954   CREATININE 0.96 12/12/2023 1212   CREATININE 1.2 11/29/2016 1054   CREATININE 1.3 12/29/2015 0954      Component Value Date/Time   CALCIUM  8.7 (L) 12/12/2023 1212   CALCIUM  9.3 11/29/2016 1054   CALCIUM  9.5 12/29/2015 0954   ALKPHOS 150 (H) 12/12/2023 1212   ALKPHOS 92 (H) 11/29/2016 1054   ALKPHOS 98 12/29/2015 0954   AST 24 12/12/2023 1212   AST 16 12/29/2015 0954   ALT 20 12/12/2023 1212   ALT 20 11/29/2016 1054   ALT 13 12/29/2015 0954   BILITOT 0.7 12/12/2023 1212   BILITOT 0.71 12/29/2015 0954        Impression and Plan:  Mr. Throgmorton is a pleasant 84 yo gentleman with original diagnosis of  polycythemia vera and early stage pancreatic cancer. He was originally treated with neoadjuvant FOLFIRINOX but unfortunately he had progressive disease.  As such, he was not able to have surgery to resect out the pancreatic mass.   I am not sure at all as when he would go into a clinical trial.  Hopefully, he would be a candidate.  I know that [facility] is doing tough job trying to get him onto protocol.  Hopefully, he will be able to have a little bit better taste for  food.  I would think that if he gets off the Xeloda, this will certainly help his taste.  His labs actually pretty good.  His blood sugar is under decent control.  We will plan to get him back probably right after he has his scans done.    Maude JONELLE Crease, MD 1/9/202611:09 AM

## 2024-01-11 NOTE — Progress Notes (Unsigned)
 Patient was just seen at [facility]. He had a CT scan which showed stable disease, but much improved pancreatic ductal dilation. He will continue with treatment as ordered. Will continue with [facility], but also follow here for local lab monitoring.   Oncology Nurse Navigator Documentation     01/11/2024    1:30 PM  Oncology Nurse Navigator Flowsheets  Navigator Follow Up Date: 02/08/2024  Navigator Follow Up Reason: Follow-up Appointment  Navigator Location [facility]  Navigator Encounter Type Appt/Treatment Plan Review  Patient Visit Type MedOnc  Treatment Phase Active Tx  Barriers/Navigation Needs No Barriers At This Time  Interventions None Required  Acuity Level 1-No Barriers  Support Groups/Services Friends and Family  Time Spent with Patient 15

## 2024-01-14 ENCOUNTER — Encounter: Payer: Self-pay | Admitting: Hematology & Oncology

## 2024-01-18 ENCOUNTER — Encounter: Payer: Self-pay | Admitting: Adult Health

## 2024-01-18 DIAGNOSIS — E1169 Type 2 diabetes mellitus with other specified complication: Secondary | ICD-10-CM

## 2024-01-18 MED ORDER — FREESTYLE LIBRE 3 PLUS SENSOR MISC: 6 refills | Status: DC

## 2024-01-18 NOTE — Telephone Encounter (Signed)
 This has been taking care of. Sensor sent in to pharmacy and pt notified of update.

## 2024-01-22 ENCOUNTER — Encounter: Payer: Self-pay | Admitting: *Deleted

## 2024-01-22 ENCOUNTER — Encounter: Payer: Self-pay | Admitting: Adult Health

## 2024-01-22 ENCOUNTER — Other Ambulatory Visit: Payer: Self-pay

## 2024-01-22 DIAGNOSIS — C250 Malignant neoplasm of head of pancreas: Secondary | ICD-10-CM

## 2024-01-23 ENCOUNTER — Encounter: Payer: Self-pay | Admitting: *Deleted

## 2024-01-24 ENCOUNTER — Other Ambulatory Visit (HOSPITAL_COMMUNITY): Payer: Self-pay

## 2024-01-24 ENCOUNTER — Other Ambulatory Visit: Payer: Self-pay

## 2024-01-24 ENCOUNTER — Encounter: Payer: Self-pay | Admitting: Hematology & Oncology

## 2024-01-24 ENCOUNTER — Inpatient Hospital Stay

## 2024-01-24 ENCOUNTER — Telehealth: Payer: Self-pay

## 2024-01-24 DIAGNOSIS — C252 Malignant neoplasm of tail of pancreas: Secondary | ICD-10-CM

## 2024-01-24 LAB — CBC WITH DIFFERENTIAL (CANCER CENTER ONLY)
Abs Immature Granulocytes: 0.03 K/uL (ref 0.00–0.07)
Basophils Absolute: 0 K/uL (ref 0.0–0.1)
Basophils Relative: 0 %
Eosinophils Absolute: 0.2 K/uL (ref 0.0–0.5)
Eosinophils Relative: 3 %
HCT: 31.9 % — ABNORMAL LOW (ref 39.0–52.0)
Hemoglobin: 11 g/dL — ABNORMAL LOW (ref 13.0–17.0)
Immature Granulocytes: 1 %
Lymphocytes Relative: 13 %
Lymphs Abs: 0.7 K/uL (ref 0.7–4.0)
MCH: 36.2 pg — ABNORMAL HIGH (ref 26.0–34.0)
MCHC: 34.5 g/dL (ref 30.0–36.0)
MCV: 104.9 fL — ABNORMAL HIGH (ref 80.0–100.0)
Monocytes Absolute: 1.1 K/uL — ABNORMAL HIGH (ref 0.1–1.0)
Monocytes Relative: 18 %
Neutro Abs: 3.8 K/uL (ref 1.7–7.7)
Neutrophils Relative %: 65 %
Platelet Count: 166 K/uL (ref 150–400)
RBC: 3.04 MIL/uL — ABNORMAL LOW (ref 4.22–5.81)
RDW: 17.9 % — ABNORMAL HIGH (ref 11.5–15.5)
WBC Count: 5.8 K/uL (ref 4.0–10.5)
nRBC: 0 % (ref 0.0–0.2)

## 2024-01-24 LAB — CMP (CANCER CENTER ONLY)
ALT: 20 U/L (ref 0–44)
AST: 20 U/L (ref 15–41)
Albumin: 3.8 g/dL (ref 3.5–5.0)
Alkaline Phosphatase: 181 U/L — ABNORMAL HIGH (ref 38–126)
Anion gap: 10 (ref 5–15)
BUN: 15 mg/dL (ref 8–23)
CO2: 26 mmol/L (ref 22–32)
Calcium: 8.6 mg/dL — ABNORMAL LOW (ref 8.9–10.3)
Chloride: 105 mmol/L (ref 98–111)
Creatinine: 1.15 mg/dL (ref 0.61–1.24)
GFR, Estimated: >60 mL/min
Glucose, Bld: 131 mg/dL — ABNORMAL HIGH (ref 70–99)
Potassium: 3.4 mmol/L — ABNORMAL LOW (ref 3.5–5.1)
Sodium: 142 mmol/L (ref 135–145)
Total Bilirubin: 0.7 mg/dL (ref 0.0–1.2)
Total Protein: 6.2 g/dL — ABNORMAL LOW (ref 6.5–8.1)

## 2024-01-24 NOTE — Telephone Encounter (Signed)
 Pharmacy Patient Advocate Encounter   Received notification from [facility] that prior authorization for [facility] 3 plus sensor is required/requested.   Insurance verification completed.   The patient is insured through [employer] [facility].   Per test claim: Medication is not eligible for pharmacy benefits and must be billed through medical insurance. As our team only handles pharmacy related prior auths, medical PA's must be submitted by the clinic. Thank you Diabetic supply must run through patient Part B benefit at pharmacy.

## 2024-01-24 NOTE — Patient Instructions (Signed)

## 2024-01-25 NOTE — Telephone Encounter (Signed)
 Called pt no answer

## 2024-01-28 NOTE — Patient Instructions (Signed)
 Visit Information  Thank you for taking time to visit with me today. Please don't hesitate to contact me if I can be of assistance to you before our next scheduled telephone appointment.  Our next appointment is by telephone on 02/21/2024 at 1 pm.  Following is a copy of your care plan:   Goals Addressed             This Visit's Progress    VBCI RN Care Plan       Problems:  Chronic Disease Management support and education needs related to DMII and Pacreatic Cancer which patient receives treatment from [facility] w/ chemotherapy & dexamenthasone  Goal: Over the next 4 months the Patient will continue to work with RN Care Manager and/or Social Worker to address care management and care coordination needs related to DMII and Pancreatic Cancer as evidenced by adherence to care management team scheduled appointments      Interventions:   Diabetes Interventions: Assessed patient's understanding of maintaining A1c goal: <6.5% Provided education to patient about basic DM disease process Reviewed medications with patient and discussed importance of medication adherence Counseled on importance of regular laboratory monitoring as prescribed Discussed plans with patient for ongoing care management follow up and provided patient with direct contact information for care management team Review of patient status, including review of consultants reports, relevant laboratory and other test results, and medications completed Review of patient status, including review of consultants reports, relevant laboratory and other test results, and medications completed Screening for signs and symptoms of depression related to chronic disease state  Assessed social determinant of health barriers Advised to contact care team or PCP for any BS highs/lows  Lab Results  Component Value Date   HGBA1C 5.8 (A) 12/03/2023    Oncology: Assessment of understanding of oncology diagnosis:  Assessed patient  understanding of cancer diagnosis and recommended treatment plan Reviewed upcoming provider appointments and treatment appointments Assessed available transportation to appointments and treatments. Has consistent/reliable transportation: Yes Assessed support system. Has consistent/reliable family or other support: Yes  Patient Self-Care Activities:  Attend all scheduled provider appointments Call pharmacy for medication refills 3-7 days in advance of running out of medications Call provider office for new concerns or questions  Perform all self care activities independently  Perform IADL's (shopping, preparing meals, housekeeping, managing finances) independently Take medications as prescribed   schedule appointment with eye doctor check feet daily for cuts, sores or redness trim toenails straight across fill half of plate with vegetables limit fast food meals to no more than 1 per week do heel pump exercise 2 to 3 times each day keep feet up while sitting  Plan:  Telephone follow up appointment with care management team member scheduled for:  02/21/24 at 1 pm The patient has been provided with contact information for the care management team and has been advised to call with any health related questions or concerns.              Care plan and visit instructions communicated with the patient verbally today. Patient agrees to receive a copy in MyChart. Active MyChart status and patient understanding of how to access instructions and care plan via MyChart confirmed with patient.     The patient has been provided with contact information for the care management team and has been advised to call with any health related questions or concerns.   Please call the care guide team at [phone] if you need to cancel or reschedule your  appointment.   Please call 1-800-273-TALK (toll free, 24 hour hotline) if you are experiencing a Mental Health or Behavioral Health Crisis or need someone  to talk to.  Kristene Liberati A. Gordy RN, BA, [facility], CRRN [place]  [facility] RN Care Manager Direct Dial : [phone]  Fax: [phone]

## 2024-01-28 NOTE — Patient Outreach (Signed)
 Complex Care Management   Visit Note  01/24/2024  Name:  Alexander [name] MRN: [id number] DOB: 1940-03-31  Situation: Referral received for Complex Care Management related to Complications of, and Ongoing Pancreatic Cancer I obtained verbal consent from Patient.  Visit completed with Patient  on the phone  Background:   Past Medical History:  Diagnosis Date   Arthritis    minor; shoulders primarily (05/20/2013)   CAD (coronary artery disease)    a. 05/2013 - Chest pain/unstable angina and dynamic EKG changes showing cath showing atherosclerotic coronary artery disease manifested as diffuse ectasia, normal EF.   Diverticulosis of colon    GERD (gastroesophageal reflux disease)    GI bleed    secondary to Aspirin    Gout    Hematuria, microscopic    Hemorrhoids    History of radiation therapy    Abdomen- 08/29/23-10/12/23- Dr. Lynwood Nasuti   Hyperglycemia    Hyperlipidemia    Hypertension    Lumbar back pain    Microalbuminuria    Overweight(278.02)    Pancreatic cancer (HCC) 09/22/2022   Polycythemia rubra vera (HCC)    Tubular adenoma of colon 07/2012    Assessment: Patient Reported Symptoms:  Cognitive Cognitive Status: No symptoms reported, Normal speech and language skills, Alert and oriented to person, place, and time, Insightful and able to interpret abstract concepts Cognitive/Intellectual Conditions Management [RPT]: None reported or documented in medical history or problem list   Health Maintenance Behaviors: Healthy diet, Stress management, Annual physical exam, Social activities, Hobbies Health Facilitated by: Healthy diet, Stress management  Neurological Neurological Review of Symptoms: No symptoms reported Neurological Management Strategies: Adequate rest, Routine screening, Coping strategies Neurological Self-Management Outcome: 4 (good)  HEENT HEENT Symptoms Reported: No symptoms reported      Cardiovascular Cardiovascular Symptoms Reported: No  symptoms reported Does patient have uncontrolled Hypertension?: No Cardiovascular Self-Management Outcome: 4 (good)  Respiratory Respiratory Symptoms Reported: No symptoms reported    Endocrine Endocrine Symptoms Reported: No symptoms reported Is patient diabetic?: Yes Endocrine Self-Management Outcome: 5 (very good)  Gastrointestinal Gastrointestinal Symptoms Reported: Other Other Gastrointestinal Symptoms: still c/o altered taste Additional Gastrointestinal Details: continues tx for Pancreatic CA Gastrointestinal Management Strategies: Medication therapy, Diet modification, Adequate rest Gastrointestinal Self-Management Outcome: 3 (uncertain)    Genitourinary Genitourinary Symptoms Reported: No symptoms reported    Integumentary Integumentary Symptoms Reported: Bruising Skin Management Strategies: Coping strategies, Routine screening Skin Self-Management Outcome: 4 (good)  Musculoskeletal Musculoskelatal Symptoms Reviewed: Other Other Musculoskeletal Symptoms: c/o minor arthritic pains Musculoskeletal Management Strategies: Routine screening, Adequate rest, Weight management      Psychosocial Psychosocial Symptoms Reported: No symptoms reported     Quality of Family Relationships: helpful, involved, supportive Do you feel physically threatened by others?: No    01/28/2024    PHQ2-9 Depression Screening   Little interest or pleasure in doing things Not at all  Feeling down, depressed, or hopeless Not at all  PHQ-2 - Total Score 0  Trouble falling or staying asleep, or sleeping too much    Feeling tired or having little energy    Poor appetite or overeating     Feeling bad about yourself - or that you are a failure or have let yourself or your family down    Trouble concentrating on things, such as reading the newspaper or watching television    Moving or speaking so slowly that other people could have noticed.  Or the opposite - being so fidgety or restless that you have been  moving  around a lot more than usual    Thoughts that you would be better off dead, or hurting yourself in some way    PHQ2-9 Total Score    If you checked off any problems, how difficult have these problems made it for you to do your work, take care of things at home, or get along with other people    Depression Interventions/Treatment      There were no vitals filed for this visit. Pain Scale: 0-10 Pain Score: 0-No pain  Medications Reviewed Today     Reviewed by Gordy Channing LABOR, RN (Registered Nurse) on 01/24/24 at 1325  Med List Status: <None>   Medication Order Taking? Sig Documenting Provider Last Dose Status Informant  Accu-Chek Softclix Lancets lancets [id number] Yes Use to test blood glucose once daily Nafziger, Darleene, NP  Active Self, Pharmacy Records  amLODipine  (NORVASC ) 10 MG tablet [id number] Yes Take 1 tablet (10 mg total) by mouth daily. Nafziger, Darleene, NP  Active Self, Pharmacy Records  capecitabine (XELODA) 500 MG tablet [id number] Yes Take 3 tablets (1500 mg) in AM and 3 tablets (1500 mg) in PM (12 hours apart) by mouth with water AFTER meal days 1-14. OFF days 15-21 [provider]  Active   chlorhexidine  (PERIDEX ) 0.12 % solution [id number]    Patient not taking: Reported on 01/24/2024   [provider]  Active   Cholecalciferol (VITAMIN D3) 50 MCG (2000 UT) CAPS [id number] Yes Take 2,000 Units by mouth with breakfast, with lunch, and with evening meal. [provider]  Active Self, Pharmacy Records  Continuous Glucose Sensor (FREESTYLE LIBRE 3 PLUS SENSOR) MISC [id number] Yes Change sensor every 15 days. Nafziger, Darleene, NP  Active   glucose blood (EMBRACE PRO GLUCOSE TEST) test strip [id number] Yes Use as instructed Nafziger, Cory, NP  Active Self, Pharmacy Records  insulin  aspart (NOVOLOG  FLEXPEN) 100 UNIT/ML FlexPen [id number] Yes INJECT 2 TO 3 UNITS WITH MEALS THREE TIMES A DAY PLUS SLIDING SCALE AS INSTRUCTED. MAX OF 20 UNITS PER DAY Thapa, Sudan,  MD  Active   insulin  glargine (LANTUS  SOLOSTAR) 100 UNIT/ML Solostar Pen [id number] Yes Inject 13 Units into the skin daily. Thapa, Sudan, MD  Active   Insulin  Pen Needle (DROPLET PEN NEEDLES) 31G X 6 MM MISC [id number] Yes Use to check blood sugar TID Nafziger, Cory, NP  Active Self, Pharmacy Records  Insulin  Pen Needle (EMBECTA PEN NEEDLE NANO 2 GEN) 32G X 4 MM MISC [id number] Yes USE TO INJECT INSULIN  FOUR TIMES A DAY Thapa, Sudan, MD  Active   lipase/protease/amylase (CREON ) 36000 UNITS CPEP capsule [id number] Yes TAKE 2 CAPSULES BY MOUTH 3 TIMES A DAY WITH MEALS AND 1 CAPSULE AS NEEDED WITH UP TO 4 SNACKS PER DAY Timmy Maude SAUNDERS, MD  Active   ondansetron  (ZOFRAN ) 8 MG tablet [id number] Yes Take by mouth. [provider]  Active   pantoprazole  (PROTONIX ) 40 MG tablet [id number] Yes Take 1 tablet (40 mg total) by mouth 3 (three) times daily. Samtani, Jai-Gurmukh, MD  Active   prochlorperazine  (COMPAZINE ) 10 MG tablet [id number] Yes Take 10 mg by mouth every 6 (six) hours. [provider]  Active   saccharomyces boulardii (FLORASTOR) 250 MG capsule [id number] Yes Take 250 mg by mouth 2 (two) times daily. [provider]  Active Self, Pharmacy Records            Recommendation:   Continue Current Plan of Care  Follow Up Plan:   Telephone follow up appointment date/time:  02/21/24  at 1pm with RN Care Manager Channing LITTIE Channing A. Gordy RN, BA, [facility], CRRN [place]  [facility] RN Care Manager Direct Dial : [phone]  Fax: [phone]

## 2024-01-29 ENCOUNTER — Encounter: Payer: Self-pay | Admitting: Adult Health

## 2024-01-31 ENCOUNTER — Other Ambulatory Visit: Payer: Self-pay

## 2024-01-31 DIAGNOSIS — E1169 Type 2 diabetes mellitus with other specified complication: Secondary | ICD-10-CM

## 2024-01-31 MED ORDER — FREESTYLE LIBRE 3 PLUS SENSOR MISC: 6 refills | Status: AC

## 2024-01-31 NOTE — Telephone Encounter (Signed)
 I informed pt that it was nothing left to do on my end with the pharmacy. Spoke to the pharmacist and she stated that they do not have the 3rd party system to do Plan B coverage. Pt stated that his insurance advised to [facility] [place] Pt will call back if he has any issues filling this.

## 2024-02-06 ENCOUNTER — Other Ambulatory Visit (HOSPITAL_COMMUNITY): Payer: Self-pay

## 2024-02-06 ENCOUNTER — Ambulatory Visit: Admitting: Gastroenterology

## 2024-02-08 ENCOUNTER — Inpatient Hospital Stay

## 2024-02-08 ENCOUNTER — Inpatient Hospital Stay: Attending: Hematology & Oncology

## 2024-02-08 ENCOUNTER — Other Ambulatory Visit: Payer: Self-pay

## 2024-02-08 ENCOUNTER — Encounter: Payer: Self-pay | Admitting: Hematology & Oncology

## 2024-02-08 ENCOUNTER — Inpatient Hospital Stay: Admitting: Hematology & Oncology

## 2024-02-08 VITALS — BP 156/83 | HR 64 | Temp 98.0°F | Resp 18 | Ht 71.0 in | Wt 143.0 lb

## 2024-02-08 DIAGNOSIS — C250 Malignant neoplasm of head of pancreas: Secondary | ICD-10-CM

## 2024-02-08 DIAGNOSIS — D45 Polycythemia vera: Secondary | ICD-10-CM

## 2024-02-08 LAB — CBC WITH DIFFERENTIAL (CANCER CENTER ONLY)
Abs Immature Granulocytes: 0.03 10*3/uL (ref 0.00–0.07)
Basophils Absolute: 0 10*3/uL (ref 0.0–0.1)
Basophils Relative: 0 %
Eosinophils Absolute: 0.2 10*3/uL (ref 0.0–0.5)
Eosinophils Relative: 2 %
HCT: 33.5 % — ABNORMAL LOW (ref 39.0–52.0)
Hemoglobin: 11.4 g/dL — ABNORMAL LOW (ref 13.0–17.0)
Immature Granulocytes: 0 %
Lymphocytes Relative: 12 %
Lymphs Abs: 0.8 10*3/uL (ref 0.7–4.0)
MCH: 35.7 pg — ABNORMAL HIGH (ref 26.0–34.0)
MCHC: 34 g/dL (ref 30.0–36.0)
MCV: 105 fL — ABNORMAL HIGH (ref 80.0–100.0)
Monocytes Absolute: 0.9 10*3/uL (ref 0.1–1.0)
Monocytes Relative: 14 %
Neutro Abs: 4.8 10*3/uL (ref 1.7–7.7)
Neutrophils Relative %: 72 %
Platelet Count: 220 10*3/uL (ref 150–400)
RBC: 3.19 MIL/uL — ABNORMAL LOW (ref 4.22–5.81)
RDW: 17.1 % — ABNORMAL HIGH (ref 11.5–15.5)
WBC Count: 6.7 10*3/uL (ref 4.0–10.5)
nRBC: 0 % (ref 0.0–0.2)

## 2024-02-08 LAB — CMP (CANCER CENTER ONLY)
ALT: 31 U/L (ref 0–44)
AST: 25 U/L (ref 15–41)
Albumin: 3.7 g/dL (ref 3.5–5.0)
Alkaline Phosphatase: 415 U/L — ABNORMAL HIGH (ref 38–126)
Anion gap: 10 (ref 5–15)
BUN: 12 mg/dL (ref 8–23)
CO2: 26 mmol/L (ref 22–32)
Calcium: 8.9 mg/dL (ref 8.9–10.3)
Chloride: 103 mmol/L (ref 98–111)
Creatinine: 1.07 mg/dL (ref 0.61–1.24)
GFR, Estimated: >60 mL/min
Glucose, Bld: 166 mg/dL — ABNORMAL HIGH (ref 70–99)
Potassium: 4.1 mmol/L (ref 3.5–5.1)
Sodium: 138 mmol/L (ref 135–145)
Total Bilirubin: 0.7 mg/dL (ref 0.0–1.2)
Total Protein: 6.3 g/dL — ABNORMAL LOW (ref 6.5–8.1)

## 2024-02-08 LAB — LACTATE DEHYDROGENASE: LDH: 189 U/L (ref 105–235)

## 2024-02-08 LAB — PREALBUMIN: Prealbumin: 18 mg/dL (ref 18–38)

## 2024-02-08 NOTE — Patient Instructions (Signed)

## 2024-02-08 NOTE — Progress Notes (Signed)
 Is been a little Hematology and Oncology Follow Up Visit  TISON LEIBOLD [id number] [birth year] 84 y.o. 02/08/2024  Principle Diagnosis:  Adenocarcinoma of the pancreas-stage Ib (T2N0M0) --clinical stage --BRCA (-), KRAS (+), HRD(-) --metastatic to lung   Current Therapy:        Status post biliary stent placement FOLFIRINOX -- Neoadj - s/p cycle #12/12  - start on 10/31/2022 --completed on 05/08/2023 XRT + Gemzar  -- start on 08/27/2023   - s/p 21/25 fractions of XRT    Interim History:  Mr. Lohmeyer is here today for follow-up.  So far, there is he is going pretty well for him.  His taste for food is slowly coming back.  However, this will certainly takes its toll on his taste buds.  He is maintaining himself fairly well.  He has had no problems with nausea or vomiting.  He has had no bleeding.  There has been no diarrhea.  His last CA 19-9 was 13.  I think he goes back to [facility] sometime in March to have his scans done.  He has had no fever.  There has been no headache.  He has had no mouth sores.  He has had no pain in his hands or feet.  There has been no swollen lymph nodes.  Overall, I will say that his performance status is probably ECOG 1.     Wt Readings from Last 3 Encounters:  02/08/24 143 lb (64.9 kg)  01/11/24 144 lb (65.3 kg)  12/24/23 146 lb (66.2 kg)   Medications:  Allergies as of 02/08/2024       Reactions   Metformin  And Related Diarrhea   Cheese Diarrhea   Allergy to soft cheese, can have hard cheese   Milk-related Compounds Diarrhea        Medication List        Accurate as of February 08, 2024  1:03 PM. If you have any questions, ask your nurse or doctor.          Accu-Chek Softclix Lancets lancets Use to test blood glucose once daily   amLODipine  10 MG tablet Commonly known as: NORVASC  Take 1 tablet (10 mg total) by mouth daily.   capecitabine 500 MG tablet Commonly known as: XELODA Take 3 tablets (1500 mg) in AM and 3 tablets (1500  mg) in PM (12 hours apart) by mouth with water AFTER meal days 1-14. OFF days 15-21   chlorhexidine  0.12 % solution Commonly known as: PERIDEX    Creon  36000-114000 units Cpep capsule Generic drug: lipase/protease/amylase TAKE 2 CAPSULES BY MOUTH 3 TIMES A DAY WITH MEALS AND 1 CAPSULE AS NEEDED WITH UP TO 4 SNACKS PER DAY   Droplet Pen Needles 31G X 6 MM Misc Generic drug: Insulin  Pen Needle Use to check blood sugar TID   Embecta Pen Needle Nano 2 Gen 32G X 4 MM Misc Generic drug: Insulin  Pen Needle USE TO INJECT INSULIN  FOUR TIMES A DAY   Embrace Pro Glucose Test test strip Generic drug: glucose blood Use as instructed   FreeStyle Libre 3 Plus Sensor Misc Change sensor every 15 days.   Lantus  SoloStar 100 UNIT/ML Solostar Pen Generic drug: insulin  glargine Inject 13 Units into the skin daily.   NovoLOG  FlexPen 100 UNIT/ML FlexPen Generic drug: insulin  aspart INJECT 2 TO 3 UNITS WITH MEALS THREE TIMES A DAY PLUS SLIDING SCALE AS INSTRUCTED. MAX OF 20 UNITS PER DAY   ondansetron  8 MG tablet Commonly known as: ZOFRAN  Take by mouth.  pantoprazole  40 MG tablet Commonly known as: PROTONIX  Take 1 tablet (40 mg total) by mouth 3 (three) times daily.   prochlorperazine  10 MG tablet Commonly known as: COMPAZINE  Take 10 mg by mouth every 6 (six) hours.   saccharomyces boulardii 250 MG capsule Commonly known as: FLORASTOR Take 250 mg by mouth 2 (two) times daily.   vitamin D3 50 MCG (2000 UT) Caps Take 2,000 Units by mouth with breakfast, with lunch, and with evening meal.        Allergies:  Allergies  Allergen Reactions   Metformin  And Related Diarrhea   Cheese Diarrhea    Allergy to soft cheese, can have hard cheese   Milk-Related Compounds Diarrhea    Past Medical History, Surgical history, Social history, and Family History were reviewed and updated.  Review of Systems:  Review of Systems  Constitutional: Negative.   HENT: Negative.    Eyes: Negative.    Respiratory: Negative.    Cardiovascular: Negative.   Gastrointestinal: Negative.   Genitourinary: Negative.   Musculoskeletal: Negative.   Skin: Negative.   Neurological: Negative.   Endo/Heme/Allergies: Negative.   Psychiatric/Behavioral: Negative.       Physical Exam:  height is 5' 11 (1.803 m) and weight is 143 lb (64.9 kg). His oral temperature is 98 F (36.7 C). His blood pressure is 156/83 (abnormal) and his pulse is 64. His respiration is 18 and oxygen saturation is 97%.   Wt Readings from Last 3 Encounters:  02/08/24 143 lb (64.9 kg)  01/11/24 144 lb (65.3 kg)  12/24/23 146 lb (66.2 kg)    Physical Exam Vitals reviewed.  HENT:     Head: Normocephalic and atraumatic.  Eyes:     Pupils: Pupils are equal, round, and reactive to light.  Cardiovascular:     Rate and Rhythm: Normal rate and regular rhythm.     Heart sounds: Normal heart sounds.  Pulmonary:     Effort: Pulmonary effort is normal.     Breath sounds: Normal breath sounds.  Abdominal:     General: Bowel sounds are normal.     Palpations: Abdomen is soft.  Musculoskeletal:        General: No tenderness or deformity. Normal range of motion.     Cervical back: Normal range of motion.  Lymphadenopathy:     Cervical: No cervical adenopathy.  Skin:    General: Skin is warm and dry.     Findings: No erythema or rash.  Neurological:     Mental Status: He is alert and oriented to person, place, and time.  Psychiatric:        Behavior: Behavior normal.        Thought Content: Thought content normal.        Judgment: Judgment normal.      Lab Results  Component Value Date   WBC 6.7 02/08/2024   HGB 11.4 (L) 02/08/2024   HCT 33.5 (L) 02/08/2024   MCV 105.0 (H) 02/08/2024   PLT 220 02/08/2024   Lab Results  Component Value Date   FERRITIN 165 10/02/2023   IRON 114 10/02/2023   TIBC 270 10/02/2023   UIBC 156 10/02/2023   IRONPCTSAT 42 (H) 10/02/2023   Lab Results  Component Value Date    RETICCTPCT 0.7 08/30/2022   RBC 3.19 (L) 02/08/2024   RETICCTABS 32.6 12/19/2013   No results found for: KPAFRELGTCHN, LAMBDASER, KAPLAMBRATIO No results found for: IGGSERUM, IGA, IGMSERUM No results found for: TOTALPROTELP, ALBUMINELP, A1GS, A2GS, BETS, BETA2SER, GAMS,  MSPIKE, SPEI   Chemistry      Component Value Date/Time   NA 142 01/24/2024 1155   NA 144 11/29/2016 1054   NA 139 12/29/2015 0954   K 3.4 (L) 01/24/2024 1155   K 4.2 11/29/2016 1054   K 4.1 12/29/2015 0954   CL 105 01/24/2024 1155   CL 102 11/29/2016 1054   CO2 26 01/24/2024 1155   CO2 30 11/29/2016 1054   CO2 26 12/29/2015 0954   BUN 15 01/24/2024 1155   BUN 22 11/29/2016 1054   BUN 20.0 12/29/2015 0954   CREATININE 1.15 01/24/2024 1155   CREATININE 1.2 11/29/2016 1054   CREATININE 1.3 12/29/2015 0954      Component Value Date/Time   CALCIUM  8.6 (L) 01/24/2024 1155   CALCIUM  9.3 11/29/2016 1054   CALCIUM  9.5 12/29/2015 0954   ALKPHOS 181 (H) 01/24/2024 1155   ALKPHOS 92 (H) 11/29/2016 1054   ALKPHOS 98 12/29/2015 0954   AST 20 01/24/2024 1155   AST 16 12/29/2015 0954   ALT 20 01/24/2024 1155   ALT 20 11/29/2016 1054   ALT 13 12/29/2015 0954   BILITOT 0.7 01/24/2024 1155   BILITOT 0.71 12/29/2015 0954        Impression and Plan:  Mr. Frappier is a pleasant 84 yo gentleman with original diagnosis of  polycythemia vera and early stage pancreatic cancer. He was originally treated with neoadjuvant FOLFIRINOX but unfortunately he had progressive disease.  As such, he was not able to have surgery to resect out the pancreatic mass.   Hopefully, he will continue to do well on the Xeloda.  I know this is has effects on his taste.  He is trying to figure out what might work for him with respect to tasting food.  Again, he goes back to [facility] sometime in March I think for his scans and follow-up.  I will plan to see him back in 3 weeks.    Maude JONELLE Crease, MD 2/6/20261:03  PM

## 2024-02-21 ENCOUNTER — Other Ambulatory Visit

## 2024-02-27 ENCOUNTER — Inpatient Hospital Stay

## 2024-02-27 ENCOUNTER — Inpatient Hospital Stay: Admitting: Hematology & Oncology

## 2024-02-29 ENCOUNTER — Ambulatory Visit: Admitting: Physician Assistant

## 2024-03-04 ENCOUNTER — Ambulatory Visit: Admitting: Endocrinology
